# Patient Record
Sex: Male | Born: 1955
Health system: Southern US, Community
[De-identification: ages and names within clinical notes are randomized; demographics above are authoritative.]

## PROBLEM LIST (undated history)

## (undated) DIAGNOSIS — K769 Liver disease, unspecified: Secondary | ICD-10-CM

## (undated) DIAGNOSIS — F909 Attention-deficit hyperactivity disorder, unspecified type: Secondary | ICD-10-CM

## (undated) DIAGNOSIS — Z21 Asymptomatic human immunodeficiency virus [HIV] infection status: Secondary | ICD-10-CM

## (undated) DIAGNOSIS — I10 Essential (primary) hypertension: Secondary | ICD-10-CM

## (undated) DIAGNOSIS — K219 Gastro-esophageal reflux disease without esophagitis: Secondary | ICD-10-CM

## (undated) DIAGNOSIS — IMO0002 Reserved for concepts with insufficient information to code with codable children: Secondary | ICD-10-CM

## (undated) DIAGNOSIS — R42 Dizziness and giddiness: Secondary | ICD-10-CM

## (undated) DIAGNOSIS — N189 Chronic kidney disease, unspecified: Secondary | ICD-10-CM

## (undated) DIAGNOSIS — D689 Coagulation defect, unspecified: Secondary | ICD-10-CM

## (undated) DIAGNOSIS — G4733 Obstructive sleep apnea (adult) (pediatric): Secondary | ICD-10-CM

## (undated) DIAGNOSIS — F329 Major depressive disorder, single episode, unspecified: Secondary | ICD-10-CM

## (undated) DIAGNOSIS — E119 Type 2 diabetes mellitus without complications: Secondary | ICD-10-CM

## (undated) DIAGNOSIS — F419 Anxiety disorder, unspecified: Secondary | ICD-10-CM

## (undated) DIAGNOSIS — I739 Peripheral vascular disease, unspecified: Secondary | ICD-10-CM

## (undated) DIAGNOSIS — F32A Depression, unspecified: Secondary | ICD-10-CM

## (undated) DIAGNOSIS — B20 Human immunodeficiency virus [HIV] disease: Secondary | ICD-10-CM

## (undated) HISTORY — DX: Obstructive sleep apnea (adult) (pediatric): G47.33

## (undated) HISTORY — DX: Type 2 diabetes mellitus without complications: E11.9

## (undated) HISTORY — DX: Major depressive disorder, single episode, unspecified: F32.9

## (undated) HISTORY — DX: Peripheral vascular disease, unspecified: I73.9

## (undated) HISTORY — DX: Anxiety disorder, unspecified: F41.9

## (undated) HISTORY — DX: Human immunodeficiency virus (HIV) disease: B20

## (undated) HISTORY — DX: Reserved for concepts with insufficient information to code with codable children: IMO0002

## (undated) HISTORY — PX: STOMACH SURGERY: SHX791

## (undated) HISTORY — DX: Liver disease, unspecified: K76.9

## (undated) HISTORY — DX: Attention-deficit hyperactivity disorder, unspecified type: F90.9

## (undated) HISTORY — DX: Dizziness and giddiness: R42

## (undated) HISTORY — DX: Depression, unspecified: F32.A

## (undated) HISTORY — DX: Chronic kidney disease, unspecified: N18.9

## (undated) HISTORY — DX: Essential (primary) hypertension: I10

## (undated) HISTORY — DX: Coagulation defect, unspecified: D68.9

## (undated) HISTORY — PX: SMALL INTESTINE SURGERY: SHX150

## (undated) HISTORY — PX: COLONOSCOPY: SHX5424

## (undated) HISTORY — DX: Asymptomatic human immunodeficiency virus (hiv) infection status: Z21

## (undated) HISTORY — DX: Gastro-esophageal reflux disease without esophagitis: K21.9

---

## 1997-08-31 ENCOUNTER — Ambulatory Visit (HOSPITAL_COMMUNITY): Admission: RE | Admit: 1997-08-31 | Discharge: 1997-08-31 | Payer: Self-pay | Admitting: Emergency Medicine

## 1997-09-04 ENCOUNTER — Emergency Department (HOSPITAL_COMMUNITY): Admission: EM | Admit: 1997-09-04 | Discharge: 1997-09-04 | Payer: Self-pay | Admitting: Emergency Medicine

## 1997-09-08 ENCOUNTER — Encounter (INDEPENDENT_AMBULATORY_CARE_PROVIDER_SITE_OTHER): Payer: Self-pay | Admitting: *Deleted

## 1997-09-08 ENCOUNTER — Encounter: Admission: RE | Admit: 1997-09-08 | Discharge: 1997-09-08 | Payer: Self-pay | Admitting: Infectious Diseases

## 1997-09-08 ENCOUNTER — Encounter: Payer: Self-pay | Admitting: Infectious Diseases

## 1997-09-08 LAB — CONVERTED CEMR LAB
CD4 Count: 290 microliters
CD4 T Cell Abs: 290

## 1997-09-29 ENCOUNTER — Encounter: Admission: RE | Admit: 1997-09-29 | Discharge: 1997-09-29 | Payer: Self-pay | Admitting: Infectious Diseases

## 1997-10-13 ENCOUNTER — Encounter: Admission: RE | Admit: 1997-10-13 | Discharge: 1997-10-13 | Payer: Self-pay | Admitting: Infectious Diseases

## 1997-10-20 ENCOUNTER — Encounter: Admission: RE | Admit: 1997-10-20 | Discharge: 1997-10-20 | Payer: Self-pay | Admitting: Infectious Diseases

## 1997-12-08 ENCOUNTER — Ambulatory Visit (HOSPITAL_COMMUNITY): Admission: RE | Admit: 1997-12-08 | Discharge: 1997-12-08 | Payer: Self-pay | Admitting: Infectious Diseases

## 1997-12-08 ENCOUNTER — Encounter: Admission: RE | Admit: 1997-12-08 | Discharge: 1997-12-08 | Payer: Self-pay | Admitting: Infectious Diseases

## 1998-02-02 ENCOUNTER — Encounter: Admission: RE | Admit: 1998-02-02 | Discharge: 1998-02-02 | Payer: Self-pay | Admitting: Infectious Diseases

## 1998-03-22 ENCOUNTER — Encounter: Admission: RE | Admit: 1998-03-22 | Discharge: 1998-03-22 | Payer: Self-pay | Admitting: Infectious Diseases

## 1998-03-22 ENCOUNTER — Ambulatory Visit (HOSPITAL_COMMUNITY): Admission: RE | Admit: 1998-03-22 | Discharge: 1998-03-22 | Payer: Self-pay | Admitting: Infectious Diseases

## 1998-03-28 ENCOUNTER — Encounter: Admission: RE | Admit: 1998-03-28 | Discharge: 1998-03-28 | Payer: Self-pay | Admitting: Infectious Diseases

## 1998-06-13 ENCOUNTER — Ambulatory Visit (HOSPITAL_COMMUNITY): Admission: RE | Admit: 1998-06-13 | Discharge: 1998-06-13 | Payer: Self-pay | Admitting: Infectious Diseases

## 1998-06-28 ENCOUNTER — Encounter: Admission: RE | Admit: 1998-06-28 | Discharge: 1998-06-28 | Payer: Self-pay | Admitting: Infectious Diseases

## 1998-08-08 ENCOUNTER — Ambulatory Visit (HOSPITAL_COMMUNITY): Admission: RE | Admit: 1998-08-08 | Discharge: 1998-08-08 | Payer: Self-pay | Admitting: Gastroenterology

## 1998-08-15 ENCOUNTER — Ambulatory Visit (HOSPITAL_COMMUNITY): Admission: RE | Admit: 1998-08-15 | Discharge: 1998-08-15 | Payer: Self-pay | Admitting: Infectious Diseases

## 1998-09-13 ENCOUNTER — Encounter: Admission: RE | Admit: 1998-09-13 | Discharge: 1998-09-13 | Payer: Self-pay | Admitting: Infectious Diseases

## 1998-09-17 ENCOUNTER — Ambulatory Visit (HOSPITAL_COMMUNITY): Admission: RE | Admit: 1998-09-17 | Discharge: 1998-09-17 | Payer: Self-pay | Admitting: Gastroenterology

## 1998-11-19 ENCOUNTER — Observation Stay (HOSPITAL_COMMUNITY): Admission: RE | Admit: 1998-11-19 | Discharge: 1998-11-21 | Payer: Self-pay | Admitting: General Surgery

## 1998-12-12 ENCOUNTER — Encounter: Admission: RE | Admit: 1998-12-12 | Discharge: 1998-12-12 | Payer: Self-pay | Admitting: Internal Medicine

## 1998-12-12 ENCOUNTER — Ambulatory Visit (HOSPITAL_COMMUNITY): Admission: RE | Admit: 1998-12-12 | Discharge: 1998-12-12 | Payer: Self-pay | Admitting: Infectious Diseases

## 1998-12-28 ENCOUNTER — Encounter: Admission: RE | Admit: 1998-12-28 | Discharge: 1998-12-28 | Payer: Self-pay | Admitting: Infectious Diseases

## 1999-01-20 ENCOUNTER — Emergency Department (HOSPITAL_COMMUNITY): Admission: EM | Admit: 1999-01-20 | Discharge: 1999-01-20 | Payer: Self-pay | Admitting: Emergency Medicine

## 1999-01-30 ENCOUNTER — Inpatient Hospital Stay (HOSPITAL_COMMUNITY): Admission: EM | Admit: 1999-01-30 | Discharge: 1999-02-03 | Payer: Self-pay | Admitting: Emergency Medicine

## 1999-02-06 ENCOUNTER — Encounter: Admission: RE | Admit: 1999-02-06 | Discharge: 1999-02-06 | Payer: Self-pay | Admitting: Internal Medicine

## 1999-02-13 ENCOUNTER — Encounter: Admission: RE | Admit: 1999-02-13 | Discharge: 1999-02-13 | Payer: Self-pay | Admitting: Infectious Diseases

## 1999-02-20 ENCOUNTER — Encounter: Admission: RE | Admit: 1999-02-20 | Discharge: 1999-02-20 | Payer: Self-pay | Admitting: Infectious Diseases

## 1999-02-27 ENCOUNTER — Encounter: Admission: RE | Admit: 1999-02-27 | Discharge: 1999-02-27 | Payer: Self-pay | Admitting: Infectious Diseases

## 1999-03-06 ENCOUNTER — Encounter: Admission: RE | Admit: 1999-03-06 | Discharge: 1999-03-06 | Payer: Self-pay | Admitting: Infectious Diseases

## 1999-03-13 ENCOUNTER — Encounter: Admission: RE | Admit: 1999-03-13 | Discharge: 1999-03-13 | Payer: Self-pay | Admitting: Internal Medicine

## 1999-03-20 ENCOUNTER — Encounter: Admission: RE | Admit: 1999-03-20 | Discharge: 1999-03-20 | Payer: Self-pay | Admitting: Infectious Diseases

## 1999-03-27 ENCOUNTER — Ambulatory Visit (HOSPITAL_COMMUNITY): Admission: RE | Admit: 1999-03-27 | Discharge: 1999-03-27 | Payer: Self-pay | Admitting: Infectious Diseases

## 1999-04-10 ENCOUNTER — Encounter: Admission: RE | Admit: 1999-04-10 | Discharge: 1999-04-10 | Payer: Self-pay | Admitting: Infectious Diseases

## 1999-04-11 ENCOUNTER — Encounter: Admission: RE | Admit: 1999-04-11 | Discharge: 1999-04-11 | Payer: Self-pay | Admitting: Infectious Diseases

## 1999-06-06 ENCOUNTER — Encounter: Admission: RE | Admit: 1999-06-06 | Discharge: 1999-06-06 | Payer: Self-pay | Admitting: Infectious Diseases

## 1999-08-02 ENCOUNTER — Encounter: Payer: Self-pay | Admitting: Infectious Diseases

## 1999-08-02 ENCOUNTER — Encounter: Admission: RE | Admit: 1999-08-02 | Discharge: 1999-08-02 | Payer: Self-pay | Admitting: Infectious Diseases

## 1999-11-14 ENCOUNTER — Encounter: Admission: RE | Admit: 1999-11-14 | Discharge: 1999-11-14 | Payer: Self-pay | Admitting: Infectious Diseases

## 1999-11-15 ENCOUNTER — Ambulatory Visit (HOSPITAL_COMMUNITY): Admission: RE | Admit: 1999-11-15 | Discharge: 1999-11-15 | Payer: Self-pay | Admitting: General Surgery

## 1999-12-11 ENCOUNTER — Encounter: Admission: RE | Admit: 1999-12-11 | Discharge: 1999-12-11 | Payer: Self-pay | Admitting: Hematology and Oncology

## 2000-03-12 ENCOUNTER — Encounter: Admission: RE | Admit: 2000-03-12 | Discharge: 2000-03-12 | Payer: Self-pay | Admitting: Infectious Diseases

## 2000-03-12 ENCOUNTER — Ambulatory Visit (HOSPITAL_COMMUNITY): Admission: RE | Admit: 2000-03-12 | Discharge: 2000-03-12 | Payer: Self-pay | Admitting: Infectious Diseases

## 2000-07-09 ENCOUNTER — Encounter: Admission: RE | Admit: 2000-07-09 | Discharge: 2000-07-09 | Payer: Self-pay | Admitting: Infectious Diseases

## 2000-09-03 ENCOUNTER — Encounter: Admission: RE | Admit: 2000-09-03 | Discharge: 2000-09-03 | Payer: Self-pay | Admitting: Infectious Diseases

## 2000-09-24 ENCOUNTER — Encounter: Admission: RE | Admit: 2000-09-24 | Discharge: 2000-09-24 | Payer: Self-pay | Admitting: Infectious Diseases

## 2000-09-24 ENCOUNTER — Ambulatory Visit (HOSPITAL_COMMUNITY): Admission: RE | Admit: 2000-09-24 | Discharge: 2000-09-24 | Payer: Self-pay | Admitting: Infectious Diseases

## 2000-10-15 ENCOUNTER — Encounter: Admission: RE | Admit: 2000-10-15 | Discharge: 2000-10-15 | Payer: Self-pay | Admitting: Infectious Diseases

## 2000-12-16 ENCOUNTER — Encounter: Admission: RE | Admit: 2000-12-16 | Discharge: 2000-12-16 | Payer: Self-pay | Admitting: Infectious Diseases

## 2001-02-14 ENCOUNTER — Ambulatory Visit (HOSPITAL_BASED_OUTPATIENT_CLINIC_OR_DEPARTMENT_OTHER): Admission: RE | Admit: 2001-02-14 | Discharge: 2001-02-14 | Payer: Self-pay | Admitting: Emergency Medicine

## 2001-03-17 ENCOUNTER — Ambulatory Visit (HOSPITAL_COMMUNITY): Admission: RE | Admit: 2001-03-17 | Discharge: 2001-03-17 | Payer: Self-pay | Admitting: Infectious Diseases

## 2001-03-17 ENCOUNTER — Encounter: Admission: RE | Admit: 2001-03-17 | Discharge: 2001-03-17 | Payer: Self-pay | Admitting: Infectious Diseases

## 2001-03-29 ENCOUNTER — Ambulatory Visit (HOSPITAL_BASED_OUTPATIENT_CLINIC_OR_DEPARTMENT_OTHER): Admission: RE | Admit: 2001-03-29 | Discharge: 2001-03-29 | Payer: Self-pay | Admitting: Emergency Medicine

## 2001-05-13 ENCOUNTER — Encounter: Admission: RE | Admit: 2001-05-13 | Discharge: 2001-05-13 | Payer: Self-pay | Admitting: Infectious Diseases

## 2001-06-29 ENCOUNTER — Ambulatory Visit (HOSPITAL_COMMUNITY): Admission: RE | Admit: 2001-06-29 | Discharge: 2001-06-29 | Payer: Self-pay | Admitting: Infectious Diseases

## 2001-06-29 ENCOUNTER — Encounter: Admission: RE | Admit: 2001-06-29 | Discharge: 2001-06-29 | Payer: Self-pay | Admitting: Infectious Diseases

## 2001-07-08 ENCOUNTER — Encounter: Admission: RE | Admit: 2001-07-08 | Discharge: 2001-07-08 | Payer: Self-pay | Admitting: Infectious Diseases

## 2001-07-22 ENCOUNTER — Encounter: Admission: RE | Admit: 2001-07-22 | Discharge: 2001-07-22 | Payer: Self-pay | Admitting: Orthopedic Surgery

## 2001-08-05 ENCOUNTER — Ambulatory Visit (HOSPITAL_COMMUNITY): Admission: RE | Admit: 2001-08-05 | Discharge: 2001-08-05 | Payer: Self-pay | Admitting: Infectious Diseases

## 2001-08-05 ENCOUNTER — Encounter: Admission: RE | Admit: 2001-08-05 | Discharge: 2001-08-05 | Payer: Self-pay | Admitting: Internal Medicine

## 2001-08-26 ENCOUNTER — Encounter: Admission: RE | Admit: 2001-08-26 | Discharge: 2001-08-26 | Payer: Self-pay | Admitting: Infectious Diseases

## 2001-09-29 ENCOUNTER — Encounter: Admission: RE | Admit: 2001-09-29 | Discharge: 2001-09-29 | Payer: Self-pay | Admitting: Infectious Diseases

## 2001-09-29 ENCOUNTER — Ambulatory Visit (HOSPITAL_COMMUNITY): Admission: RE | Admit: 2001-09-29 | Discharge: 2001-09-29 | Payer: Self-pay | Admitting: Infectious Diseases

## 2001-10-14 ENCOUNTER — Encounter: Admission: RE | Admit: 2001-10-14 | Discharge: 2001-10-14 | Payer: Self-pay | Admitting: Infectious Diseases

## 2001-12-15 ENCOUNTER — Ambulatory Visit (HOSPITAL_COMMUNITY): Admission: RE | Admit: 2001-12-15 | Discharge: 2001-12-15 | Payer: Self-pay | Admitting: Infectious Diseases

## 2001-12-15 ENCOUNTER — Encounter: Admission: RE | Admit: 2001-12-15 | Discharge: 2001-12-15 | Payer: Self-pay | Admitting: Infectious Diseases

## 2001-12-30 ENCOUNTER — Encounter: Admission: RE | Admit: 2001-12-30 | Discharge: 2001-12-30 | Payer: Self-pay | Admitting: Infectious Diseases

## 2002-03-09 ENCOUNTER — Ambulatory Visit (HOSPITAL_COMMUNITY): Admission: RE | Admit: 2002-03-09 | Discharge: 2002-03-09 | Payer: Self-pay | Admitting: Infectious Diseases

## 2002-03-09 ENCOUNTER — Encounter: Admission: RE | Admit: 2002-03-09 | Discharge: 2002-03-09 | Payer: Self-pay | Admitting: Infectious Diseases

## 2002-03-24 ENCOUNTER — Encounter: Admission: RE | Admit: 2002-03-24 | Discharge: 2002-03-24 | Payer: Self-pay | Admitting: Infectious Diseases

## 2002-07-14 ENCOUNTER — Encounter: Payer: Self-pay | Admitting: Infectious Diseases

## 2002-07-14 ENCOUNTER — Encounter: Admission: RE | Admit: 2002-07-14 | Discharge: 2002-07-14 | Payer: Self-pay | Admitting: Infectious Diseases

## 2002-07-28 ENCOUNTER — Encounter: Admission: RE | Admit: 2002-07-28 | Discharge: 2002-07-28 | Payer: Self-pay | Admitting: Infectious Diseases

## 2002-11-01 ENCOUNTER — Ambulatory Visit (HOSPITAL_COMMUNITY): Admission: RE | Admit: 2002-11-01 | Discharge: 2002-11-01 | Payer: Self-pay | Admitting: Infectious Diseases

## 2002-11-01 ENCOUNTER — Encounter: Payer: Self-pay | Admitting: Infectious Diseases

## 2002-11-05 ENCOUNTER — Emergency Department (HOSPITAL_COMMUNITY): Admission: EM | Admit: 2002-11-05 | Discharge: 2002-11-05 | Payer: Self-pay | Admitting: Emergency Medicine

## 2002-11-05 ENCOUNTER — Encounter: Payer: Self-pay | Admitting: Emergency Medicine

## 2002-11-11 ENCOUNTER — Encounter: Admission: RE | Admit: 2002-11-11 | Discharge: 2002-11-11 | Payer: Self-pay | Admitting: Infectious Diseases

## 2003-01-26 ENCOUNTER — Ambulatory Visit (HOSPITAL_COMMUNITY): Admission: RE | Admit: 2003-01-26 | Discharge: 2003-01-26 | Payer: Self-pay | Admitting: Infectious Diseases

## 2003-01-26 ENCOUNTER — Encounter: Payer: Self-pay | Admitting: Infectious Diseases

## 2003-01-26 ENCOUNTER — Encounter: Admission: RE | Admit: 2003-01-26 | Discharge: 2003-01-26 | Payer: Self-pay | Admitting: Infectious Diseases

## 2003-02-09 ENCOUNTER — Encounter: Admission: RE | Admit: 2003-02-09 | Discharge: 2003-02-09 | Payer: Self-pay | Admitting: Infectious Diseases

## 2003-02-10 ENCOUNTER — Encounter: Admission: RE | Admit: 2003-02-10 | Discharge: 2003-02-10 | Payer: Self-pay | Admitting: Emergency Medicine

## 2003-04-27 ENCOUNTER — Ambulatory Visit (HOSPITAL_BASED_OUTPATIENT_CLINIC_OR_DEPARTMENT_OTHER): Admission: RE | Admit: 2003-04-27 | Discharge: 2003-04-27 | Payer: Self-pay | Admitting: Emergency Medicine

## 2003-04-29 ENCOUNTER — Emergency Department (HOSPITAL_COMMUNITY): Admission: AD | Admit: 2003-04-29 | Discharge: 2003-04-30 | Payer: Self-pay | Admitting: Emergency Medicine

## 2003-05-05 ENCOUNTER — Encounter: Admission: RE | Admit: 2003-05-05 | Discharge: 2003-05-05 | Payer: Self-pay | Admitting: Infectious Diseases

## 2003-05-05 ENCOUNTER — Ambulatory Visit (HOSPITAL_COMMUNITY): Admission: RE | Admit: 2003-05-05 | Discharge: 2003-05-05 | Payer: Self-pay | Admitting: Infectious Diseases

## 2003-05-18 ENCOUNTER — Encounter: Admission: RE | Admit: 2003-05-18 | Discharge: 2003-05-18 | Payer: Self-pay | Admitting: Infectious Diseases

## 2003-08-16 ENCOUNTER — Ambulatory Visit (HOSPITAL_COMMUNITY): Admission: RE | Admit: 2003-08-16 | Discharge: 2003-08-16 | Payer: Self-pay | Admitting: Infectious Diseases

## 2003-08-16 ENCOUNTER — Encounter: Admission: RE | Admit: 2003-08-16 | Discharge: 2003-08-16 | Payer: Self-pay | Admitting: Infectious Diseases

## 2003-10-05 ENCOUNTER — Encounter: Admission: RE | Admit: 2003-10-05 | Discharge: 2003-10-05 | Payer: Self-pay | Admitting: Infectious Diseases

## 2003-12-28 ENCOUNTER — Ambulatory Visit (HOSPITAL_COMMUNITY): Admission: RE | Admit: 2003-12-28 | Discharge: 2003-12-28 | Payer: Self-pay | Admitting: Infectious Diseases

## 2003-12-28 ENCOUNTER — Ambulatory Visit: Payer: Self-pay | Admitting: Infectious Diseases

## 2004-01-26 ENCOUNTER — Ambulatory Visit: Payer: Self-pay | Admitting: Infectious Diseases

## 2004-05-09 ENCOUNTER — Ambulatory Visit: Payer: Self-pay | Admitting: Infectious Diseases

## 2004-05-09 ENCOUNTER — Ambulatory Visit (HOSPITAL_COMMUNITY): Admission: RE | Admit: 2004-05-09 | Discharge: 2004-05-09 | Payer: Self-pay | Admitting: Infectious Diseases

## 2004-05-23 ENCOUNTER — Ambulatory Visit: Payer: Self-pay | Admitting: Infectious Diseases

## 2004-08-21 ENCOUNTER — Ambulatory Visit (HOSPITAL_COMMUNITY): Admission: RE | Admit: 2004-08-21 | Discharge: 2004-08-21 | Payer: Self-pay | Admitting: Infectious Diseases

## 2004-08-21 ENCOUNTER — Ambulatory Visit: Payer: Self-pay | Admitting: Infectious Diseases

## 2004-09-12 ENCOUNTER — Ambulatory Visit: Payer: Self-pay | Admitting: Infectious Diseases

## 2005-01-02 ENCOUNTER — Ambulatory Visit: Payer: Self-pay | Admitting: Infectious Diseases

## 2005-01-02 ENCOUNTER — Encounter (INDEPENDENT_AMBULATORY_CARE_PROVIDER_SITE_OTHER): Payer: Self-pay | Admitting: *Deleted

## 2005-01-02 ENCOUNTER — Ambulatory Visit (HOSPITAL_COMMUNITY): Admission: RE | Admit: 2005-01-02 | Discharge: 2005-01-02 | Payer: Self-pay | Admitting: Infectious Diseases

## 2005-01-02 LAB — CONVERTED CEMR LAB
CD4 Count: 260 microliters
HIV 1 RNA Quant: 399 copies/mL

## 2005-01-15 ENCOUNTER — Ambulatory Visit: Payer: Self-pay | Admitting: Infectious Diseases

## 2005-06-19 ENCOUNTER — Encounter (INDEPENDENT_AMBULATORY_CARE_PROVIDER_SITE_OTHER): Payer: Self-pay | Admitting: *Deleted

## 2005-06-19 ENCOUNTER — Ambulatory Visit: Payer: Self-pay | Admitting: Infectious Diseases

## 2005-06-19 ENCOUNTER — Encounter: Admission: RE | Admit: 2005-06-19 | Discharge: 2005-06-19 | Payer: Self-pay | Admitting: Infectious Diseases

## 2005-06-19 LAB — CONVERTED CEMR LAB
CD4 Count: 410 microliters
HIV 1 RNA Quant: 399 copies/mL

## 2005-07-04 ENCOUNTER — Ambulatory Visit: Payer: Self-pay | Admitting: Infectious Diseases

## 2005-12-11 ENCOUNTER — Encounter: Admission: RE | Admit: 2005-12-11 | Discharge: 2005-12-11 | Payer: Self-pay | Admitting: Infectious Diseases

## 2005-12-11 ENCOUNTER — Ambulatory Visit: Payer: Self-pay | Admitting: Infectious Diseases

## 2005-12-11 ENCOUNTER — Encounter (INDEPENDENT_AMBULATORY_CARE_PROVIDER_SITE_OTHER): Payer: Self-pay | Admitting: *Deleted

## 2005-12-11 LAB — CONVERTED CEMR LAB
CD4 Count: 330 microliters
HIV 1 RNA Quant: 173 copies/mL

## 2005-12-25 ENCOUNTER — Ambulatory Visit: Payer: Self-pay | Admitting: Infectious Diseases

## 2006-04-20 ENCOUNTER — Encounter (INDEPENDENT_AMBULATORY_CARE_PROVIDER_SITE_OTHER): Payer: Self-pay | Admitting: *Deleted

## 2006-04-20 LAB — CONVERTED CEMR LAB

## 2006-05-03 ENCOUNTER — Encounter (INDEPENDENT_AMBULATORY_CARE_PROVIDER_SITE_OTHER): Payer: Self-pay | Admitting: *Deleted

## 2006-06-04 DIAGNOSIS — R7989 Other specified abnormal findings of blood chemistry: Secondary | ICD-10-CM | POA: Insufficient documentation

## 2006-06-04 DIAGNOSIS — F909 Attention-deficit hyperactivity disorder, unspecified type: Secondary | ICD-10-CM | POA: Insufficient documentation

## 2006-06-04 DIAGNOSIS — B009 Herpesviral infection, unspecified: Secondary | ICD-10-CM | POA: Insufficient documentation

## 2006-06-04 DIAGNOSIS — F329 Major depressive disorder, single episode, unspecified: Secondary | ICD-10-CM | POA: Insufficient documentation

## 2006-06-04 DIAGNOSIS — F32A Depression, unspecified: Secondary | ICD-10-CM | POA: Insufficient documentation

## 2006-06-04 DIAGNOSIS — R799 Abnormal finding of blood chemistry, unspecified: Secondary | ICD-10-CM | POA: Insufficient documentation

## 2006-06-04 DIAGNOSIS — F988 Other specified behavioral and emotional disorders with onset usually occurring in childhood and adolescence: Secondary | ICD-10-CM | POA: Insufficient documentation

## 2006-06-04 DIAGNOSIS — B20 Human immunodeficiency virus [HIV] disease: Secondary | ICD-10-CM | POA: Insufficient documentation

## 2006-06-04 DIAGNOSIS — M19049 Primary osteoarthritis, unspecified hand: Secondary | ICD-10-CM | POA: Insufficient documentation

## 2006-06-04 DIAGNOSIS — K219 Gastro-esophageal reflux disease without esophagitis: Secondary | ICD-10-CM | POA: Insufficient documentation

## 2006-06-04 DIAGNOSIS — B029 Zoster without complications: Secondary | ICD-10-CM | POA: Insufficient documentation

## 2006-06-04 DIAGNOSIS — Z21 Asymptomatic human immunodeficiency virus [HIV] infection status: Secondary | ICD-10-CM | POA: Insufficient documentation

## 2006-06-04 DIAGNOSIS — I809 Phlebitis and thrombophlebitis of unspecified site: Secondary | ICD-10-CM | POA: Insufficient documentation

## 2006-06-04 DIAGNOSIS — Z8619 Personal history of other infectious and parasitic diseases: Secondary | ICD-10-CM | POA: Insufficient documentation

## 2006-06-05 ENCOUNTER — Encounter: Payer: Self-pay | Admitting: Infectious Diseases

## 2006-08-20 ENCOUNTER — Encounter: Admission: RE | Admit: 2006-08-20 | Discharge: 2006-08-20 | Payer: Self-pay | Admitting: Infectious Diseases

## 2006-08-20 ENCOUNTER — Ambulatory Visit: Payer: Self-pay | Admitting: Infectious Diseases

## 2006-08-20 LAB — CONVERTED CEMR LAB
Blood Glucose, Fingerstick: 111
HIV 1 RNA Quant: 164 copies/mL — ABNORMAL HIGH (ref <50)
HIV-1 RNA Quant, Log: 2.21 — ABNORMAL HIGH (ref <1.70)
Hgb A1c MFr Bld: 5.3 %

## 2006-08-21 ENCOUNTER — Encounter: Payer: Self-pay | Admitting: Infectious Diseases

## 2006-08-21 LAB — CONVERTED CEMR LAB
ALT: 27 units/L (ref 0–53)
AST: 24 units/L (ref 0–37)
Albumin: 4.2 g/dL (ref 3.5–5.2)
Alkaline Phosphatase: 123 units/L — ABNORMAL HIGH (ref 39–117)
BUN: 21 mg/dL (ref 6–23)
Basophils Absolute: 0 10*3/uL (ref 0.0–0.1)
Basophils Relative: 0 % (ref 0–1)
CD4 Count: 470 microliters
CO2: 22 meq/L (ref 19–32)
Calcium: 9.1 mg/dL (ref 8.4–10.5)
Chloride: 107 meq/L (ref 96–112)
Cholesterol: 201 mg/dL — ABNORMAL HIGH (ref 0–200)
Creatinine, Ser: 1.12 mg/dL (ref 0.40–1.50)
Eosinophils Absolute: 0.2 10*3/uL (ref 0.0–0.7)
Eosinophils Relative: 4 % (ref 0–5)
Glucose, Bld: 108 mg/dL — ABNORMAL HIGH (ref 70–99)
HCT: 43.6 % (ref 39.0–52.0)
HDL: 45 mg/dL (ref >39)
Hemoglobin: 14.8 g/dL (ref 13.0–17.0)
Ketones, ur: NEGATIVE mg/dL
LDL Cholesterol: 125 mg/dL — ABNORMAL HIGH (ref 0–99)
Lymphocytes Relative: 31 % (ref 12–46)
Lymphs Abs: 1.4 10*3/uL (ref 0.7–3.3)
MCHC: 33.9 g/dL (ref 30.0–36.0)
MCV: 90.6 fL (ref 78.0–100.0)
Monocytes Absolute: 0.5 10*3/uL (ref 0.2–0.7)
Monocytes Relative: 10 % (ref 3–11)
Neutro Abs: 2.5 10*3/uL (ref 1.7–7.7)
Neutrophils Relative %: 55 % (ref 43–77)
Nitrite: NEGATIVE
Platelets: 160 10*3/uL (ref 150–400)
Potassium: 4 meq/L (ref 3.5–5.3)
Protein, ur: 30 mg/dL — AB
RBC: 4.81 M/uL (ref 4.22–5.81)
RDW: 14.8 % — ABNORMAL HIGH (ref 11.5–14.0)
Sodium: 138 meq/L (ref 135–145)
Specific Gravity, Urine: 1.025 (ref 1.005–1.03)
Total Bilirubin: 0.5 mg/dL (ref 0.3–1.2)
Total CHOL/HDL Ratio: 4.5
Total Protein: 6.9 g/dL (ref 6.0–8.3)
Triglycerides: 153 mg/dL — ABNORMAL HIGH (ref <150)
Urine Glucose: NEGATIVE mg/dL
Urobilinogen, UA: 0.2 (ref 0.0–1.0)
VLDL: 31 mg/dL (ref 0–40)
WBC: 4.5 10*3/uL (ref 4.0–10.5)
pH: 5.5 (ref 5.0–8.0)

## 2006-09-03 ENCOUNTER — Ambulatory Visit: Payer: Self-pay | Admitting: Infectious Diseases

## 2006-09-03 DIAGNOSIS — R82998 Other abnormal findings in urine: Secondary | ICD-10-CM | POA: Insufficient documentation

## 2006-09-03 DIAGNOSIS — R8290 Unspecified abnormal findings in urine: Secondary | ICD-10-CM | POA: Insufficient documentation

## 2006-09-15 DIAGNOSIS — N390 Urinary tract infection, site not specified: Secondary | ICD-10-CM | POA: Insufficient documentation

## 2006-10-05 ENCOUNTER — Encounter: Payer: Self-pay | Admitting: Infectious Diseases

## 2007-01-01 ENCOUNTER — Encounter: Admission: RE | Admit: 2007-01-01 | Discharge: 2007-01-01 | Payer: Self-pay | Admitting: Infectious Diseases

## 2007-01-01 ENCOUNTER — Ambulatory Visit: Payer: Self-pay | Admitting: Infectious Diseases

## 2007-01-01 LAB — CONVERTED CEMR LAB
ALT: 29 units/L (ref 0–53)
AST: 23 units/L (ref 0–37)
Albumin: 4.5 g/dL (ref 3.5–5.2)
Alkaline Phosphatase: 146 units/L — ABNORMAL HIGH (ref 39–117)
BUN: 16 mg/dL (ref 6–23)
Basophils Absolute: 0 10*3/uL (ref 0.0–0.1)
Basophils Relative: 0 % (ref 0–1)
CO2: 28 meq/L (ref 19–32)
Calcium: 9.3 mg/dL (ref 8.4–10.5)
Chloride: 102 meq/L (ref 96–112)
Creatinine, Ser: 1.29 mg/dL (ref 0.40–1.50)
Eosinophils Absolute: 0.2 10*3/uL (ref 0.0–0.7)
Eosinophils Relative: 4 % (ref 0–5)
Free T4: 1.22 ng/dL (ref 0.89–1.80)
Glucose, Bld: 108 mg/dL — ABNORMAL HIGH (ref 70–99)
Glucose, Urine, Semiquant: NEGATIVE
HCT: 45 % (ref 39.0–52.0)
HIV 1 RNA Quant: 107 copies/mL — ABNORMAL HIGH (ref <50)
HIV-1 RNA Quant, Log: 2.03 — ABNORMAL HIGH (ref <1.70)
Hemoglobin: 15.7 g/dL (ref 13.0–17.0)
Ketones, urine, test strip: NEGATIVE
Lymphocytes Relative: 25 % (ref 12–46)
Lymphs Abs: 1.5 10*3/uL (ref 0.7–3.3)
MCHC: 34.9 g/dL (ref 30.0–36.0)
MCV: 92.7 fL (ref 78.0–100.0)
Monocytes Absolute: 0.5 10*3/uL (ref 0.2–0.7)
Monocytes Relative: 8 % (ref 3–11)
Neutro Abs: 3.7 10*3/uL (ref 1.7–7.7)
Neutrophils Relative %: 63 % (ref 43–77)
Nitrite: NEGATIVE
Platelets: 178 10*3/uL (ref 150–400)
Potassium: 4.1 meq/L (ref 3.5–5.3)
Protein, U semiquant: 30
RBC Folate: 640 ng/mL — ABNORMAL HIGH (ref 180–600)
RBC: 4.85 M/uL (ref 4.22–5.81)
RDW: 13.3 % (ref 11.5–14.0)
Sodium: 140 meq/L (ref 135–145)
Specific Gravity, Urine: >=1.03
TSH: 2.762 microintl units/mL (ref 0.350–5.50)
Total Bilirubin: 0.6 mg/dL (ref 0.3–1.2)
Total Protein: 7.2 g/dL (ref 6.0–8.3)
Urobilinogen, UA: 0.2
Vitamin B-12: 550 pg/mL (ref 211–911)
WBC Urine, dipstick: NEGATIVE
WBC: 5.9 10*3/uL (ref 4.0–10.5)
pH: 5.5

## 2007-01-02 ENCOUNTER — Encounter: Payer: Self-pay | Admitting: Infectious Diseases

## 2007-01-13 ENCOUNTER — Encounter (INDEPENDENT_AMBULATORY_CARE_PROVIDER_SITE_OTHER): Payer: Self-pay | Admitting: *Deleted

## 2007-01-14 ENCOUNTER — Ambulatory Visit: Payer: Self-pay | Admitting: Infectious Diseases

## 2007-02-08 ENCOUNTER — Telehealth: Payer: Self-pay | Admitting: Infectious Diseases

## 2007-02-09 ENCOUNTER — Telehealth: Payer: Self-pay | Admitting: Infectious Diseases

## 2007-02-23 ENCOUNTER — Encounter: Payer: Self-pay | Admitting: Infectious Diseases

## 2007-04-29 ENCOUNTER — Encounter (INDEPENDENT_AMBULATORY_CARE_PROVIDER_SITE_OTHER): Payer: Self-pay | Admitting: *Deleted

## 2007-04-29 ENCOUNTER — Telehealth: Payer: Self-pay | Admitting: Infectious Diseases

## 2007-05-13 ENCOUNTER — Encounter (INDEPENDENT_AMBULATORY_CARE_PROVIDER_SITE_OTHER): Payer: Self-pay | Admitting: *Deleted

## 2007-06-17 ENCOUNTER — Encounter: Payer: Self-pay | Admitting: Infectious Diseases

## 2007-08-17 ENCOUNTER — Encounter: Payer: Self-pay | Admitting: Infectious Diseases

## 2007-08-17 ENCOUNTER — Encounter: Admission: RE | Admit: 2007-08-17 | Discharge: 2007-08-17 | Payer: Self-pay | Admitting: Internal Medicine

## 2007-08-17 ENCOUNTER — Ambulatory Visit: Payer: Self-pay | Admitting: Internal Medicine

## 2007-08-17 LAB — CONVERTED CEMR LAB
ALT: 60 units/L — ABNORMAL HIGH (ref 0–53)
AST: 62 units/L — ABNORMAL HIGH (ref 0–37)
Albumin: 3.9 g/dL (ref 3.5–5.2)
Alkaline Phosphatase: 133 units/L — ABNORMAL HIGH (ref 39–117)
BUN: 23 mg/dL (ref 6–23)
Basophils Absolute: 0 10*3/uL (ref 0.0–0.1)
Basophils Relative: 0 % (ref 0–1)
CO2: 21 meq/L (ref 19–32)
Calcium: 8.9 mg/dL (ref 8.4–10.5)
Chloride: 105 meq/L (ref 96–112)
Creatinine, Ser: 1.07 mg/dL (ref 0.40–1.50)
Eosinophils Absolute: 0.1 10*3/uL (ref 0.0–0.7)
Eosinophils Relative: 3 % (ref 0–5)
Glucose, Bld: 180 mg/dL — ABNORMAL HIGH (ref 70–99)
HCT: 44.5 % (ref 39.0–52.0)
HIV 1 RNA Quant: 189 copies/mL — ABNORMAL HIGH (ref <50)
HIV-1 RNA Quant, Log: 2.28 — ABNORMAL HIGH (ref <1.70)
Hemoglobin: 15.2 g/dL (ref 13.0–17.0)
Lymphocytes Relative: 26 % (ref 12–46)
Lymphs Abs: 1.3 10*3/uL (ref 0.7–4.0)
MCHC: 34.2 g/dL (ref 30.0–36.0)
MCV: 94.1 fL (ref 78.0–100.0)
Monocytes Absolute: 0.5 10*3/uL (ref 0.1–1.0)
Monocytes Relative: 10 % (ref 3–12)
Neutro Abs: 3 10*3/uL (ref 1.7–7.7)
Neutrophils Relative %: 61 % (ref 43–77)
Platelets: 122 10*3/uL — ABNORMAL LOW (ref 150–400)
Potassium: 4.1 meq/L (ref 3.5–5.3)
RBC: 4.73 M/uL (ref 4.22–5.81)
RDW: 14.5 % (ref 11.5–15.5)
Sodium: 141 meq/L (ref 135–145)
Total Bilirubin: 0.6 mg/dL (ref 0.3–1.2)
Total Protein: 6.6 g/dL (ref 6.0–8.3)
WBC: 5 10*3/uL (ref 4.0–10.5)

## 2007-08-26 ENCOUNTER — Ambulatory Visit: Payer: Self-pay | Admitting: Infectious Diseases

## 2007-12-22 ENCOUNTER — Ambulatory Visit: Payer: Self-pay | Admitting: Internal Medicine

## 2008-02-03 ENCOUNTER — Telehealth (INDEPENDENT_AMBULATORY_CARE_PROVIDER_SITE_OTHER): Payer: Self-pay | Admitting: *Deleted

## 2008-02-14 ENCOUNTER — Ambulatory Visit: Payer: Self-pay | Admitting: Infectious Diseases

## 2008-02-14 LAB — CONVERTED CEMR LAB
BUN: 22 mg/dL (ref 6–23)
Basophils Absolute: 0 10*3/uL (ref 0.0–0.1)
Basophils Relative: 0 % (ref 0–1)
CO2: 25 meq/L (ref 19–32)
Calcium: 9.6 mg/dL (ref 8.4–10.5)
Chloride: 102 meq/L (ref 96–112)
Cholesterol: 219 mg/dL — ABNORMAL HIGH (ref 0–200)
Creatinine, Ser: 0.95 mg/dL (ref 0.40–1.50)
Eosinophils Absolute: 0.2 10*3/uL (ref 0.0–0.7)
Eosinophils Relative: 3 % (ref 0–5)
Glucose, Bld: 145 mg/dL — ABNORMAL HIGH (ref 70–99)
HCT: 48.1 % (ref 39.0–52.0)
HDL: 45 mg/dL (ref >39)
HIV 1 RNA Quant: 272 copies/mL — ABNORMAL HIGH (ref <48)
HIV-1 RNA Quant, Log: 2.43 — ABNORMAL HIGH (ref <1.68)
Hemoglobin: 16.9 g/dL (ref 13.0–17.0)
LDL Cholesterol: 108 mg/dL — ABNORMAL HIGH (ref 0–99)
Lymphocytes Relative: 24 % (ref 12–46)
Lymphs Abs: 1.6 10*3/uL (ref 0.7–4.0)
MCHC: 35.1 g/dL (ref 30.0–36.0)
MCV: 91.1 fL (ref 78.0–100.0)
Monocytes Absolute: 0.7 10*3/uL (ref 0.1–1.0)
Monocytes Relative: 10 % (ref 3–12)
Neutro Abs: 4.3 10*3/uL (ref 1.7–7.7)
Neutrophils Relative %: 63 % (ref 43–77)
Platelets: 167 10*3/uL (ref 150–400)
Potassium: 4.3 meq/L (ref 3.5–5.3)
RBC: 5.28 M/uL (ref 4.22–5.81)
RDW: 13.4 % (ref 11.5–15.5)
Sodium: 138 meq/L (ref 135–145)
Total CHOL/HDL Ratio: 4.9
Triglycerides: 332 mg/dL — ABNORMAL HIGH (ref <150)
VLDL: 66 mg/dL — ABNORMAL HIGH (ref 0–40)
WBC: 6.8 10*3/uL (ref 4.0–10.5)

## 2008-03-09 ENCOUNTER — Ambulatory Visit: Payer: Self-pay | Admitting: Infectious Diseases

## 2008-05-08 ENCOUNTER — Encounter (INDEPENDENT_AMBULATORY_CARE_PROVIDER_SITE_OTHER): Payer: Self-pay | Admitting: *Deleted

## 2008-06-21 ENCOUNTER — Encounter (INDEPENDENT_AMBULATORY_CARE_PROVIDER_SITE_OTHER): Payer: Self-pay | Admitting: *Deleted

## 2008-10-13 ENCOUNTER — Ambulatory Visit: Payer: Self-pay | Admitting: Infectious Diseases

## 2008-10-13 LAB — CONVERTED CEMR LAB
ALT: 73 units/L — ABNORMAL HIGH (ref 0–53)
AST: 55 units/L — ABNORMAL HIGH (ref 0–37)
Albumin: 4.2 g/dL (ref 3.5–5.2)
Alkaline Phosphatase: 135 units/L — ABNORMAL HIGH (ref 39–117)
BUN: 19 mg/dL (ref 6–23)
Basophils Absolute: 0 10*3/uL (ref 0.0–0.1)
Basophils Relative: 0 % (ref 0–1)
CO2: 22 meq/L (ref 19–32)
Calcium: 8.8 mg/dL (ref 8.4–10.5)
Chloride: 109 meq/L (ref 96–112)
Creatinine, Ser: 1.06 mg/dL (ref 0.40–1.50)
Eosinophils Absolute: 0.2 10*3/uL (ref 0.0–0.7)
Eosinophils Relative: 3 % (ref 0–5)
Glucose, Bld: 127 mg/dL — ABNORMAL HIGH (ref 70–99)
HCT: 45.2 % (ref 39.0–52.0)
HIV 1 RNA Quant: 113 copies/mL — ABNORMAL HIGH (ref <48)
HIV-1 RNA Quant, Log: 2.05 — ABNORMAL HIGH (ref <1.68)
Hemoglobin: 15.5 g/dL (ref 13.0–17.0)
Lymphocytes Relative: 22 % (ref 12–46)
Lymphs Abs: 1.4 10*3/uL (ref 0.7–4.0)
MCHC: 34.3 g/dL (ref 30.0–36.0)
MCV: 96.2 fL (ref >=78.0)
Monocytes Absolute: 0.6 10*3/uL (ref 0.1–1.0)
Monocytes Relative: 9 % (ref 3–12)
Neutro Abs: 4.1 10*3/uL (ref 1.7–7.7)
Neutrophils Relative %: 66 % (ref 43–77)
Platelets: 123 10*3/uL — ABNORMAL LOW (ref 150–400)
Potassium: 4.1 meq/L (ref 3.5–5.3)
RBC: 4.7 M/uL (ref 4.22–5.81)
RDW: 14.3 % (ref 11.5–15.5)
Sodium: 142 meq/L (ref 135–145)
Total Bilirubin: 0.7 mg/dL (ref 0.3–1.2)
Total Protein: 7 g/dL (ref 6.0–8.3)
WBC: 6.2 10*3/uL (ref 4.0–10.5)

## 2008-10-18 ENCOUNTER — Encounter: Payer: Self-pay | Admitting: Infectious Diseases

## 2008-10-20 ENCOUNTER — Ambulatory Visit: Payer: Self-pay | Admitting: Infectious Diseases

## 2008-11-09 ENCOUNTER — Ambulatory Visit: Payer: Self-pay | Admitting: Infectious Diseases

## 2008-11-24 ENCOUNTER — Ambulatory Visit: Payer: Self-pay | Admitting: Internal Medicine

## 2009-03-29 ENCOUNTER — Ambulatory Visit: Payer: Self-pay | Admitting: Infectious Diseases

## 2009-03-29 LAB — CONVERTED CEMR LAB
ALT: 53 units/L (ref 0–53)
AST: 34 units/L (ref 0–37)
Albumin: 4.6 g/dL (ref 3.5–5.2)
Alkaline Phosphatase: 171 units/L — ABNORMAL HIGH (ref 39–117)
BUN: 15 mg/dL (ref 6–23)
Basophils Absolute: 0 10*3/uL (ref 0.0–0.1)
Basophils Relative: 1 % (ref 0–1)
CO2: 27 meq/L (ref 19–32)
Calcium: 9.7 mg/dL (ref 8.4–10.5)
Chloride: 104 meq/L (ref 96–112)
Cholesterol: 184 mg/dL (ref 0–200)
Creatinine, Ser: 1.1 mg/dL (ref 0.40–1.50)
Eosinophils Absolute: 0.2 10*3/uL (ref 0.0–0.7)
Eosinophils Relative: 2 % (ref 0–5)
Glucose, Bld: 102 mg/dL — ABNORMAL HIGH (ref 70–99)
HCT: 48.9 % (ref 39.0–52.0)
HDL: 52 mg/dL (ref >39)
HIV 1 RNA Quant: 179 copies/mL — ABNORMAL HIGH (ref <48)
HIV-1 RNA Quant, Log: 2.25 — ABNORMAL HIGH (ref <1.68)
Hemoglobin: 17.4 g/dL — ABNORMAL HIGH (ref 13.0–17.0)
LDL Cholesterol: 59 mg/dL (ref 0–99)
Lymphocytes Relative: 28 % (ref 12–46)
Lymphs Abs: 2.4 10*3/uL (ref 0.7–4.0)
MCHC: 35.6 g/dL (ref 30.0–36.0)
MCV: 92.4 fL (ref >=78.0)
Monocytes Absolute: 0.8 10*3/uL (ref 0.1–1.0)
Monocytes Relative: 9 % (ref 3–12)
Neutro Abs: 5.1 10*3/uL (ref 1.7–7.7)
Neutrophils Relative %: 60 % (ref 43–77)
Platelets: 165 10*3/uL (ref 150–400)
Potassium: 4.4 meq/L (ref 3.5–5.3)
RBC: 5.29 M/uL (ref 4.22–5.81)
RDW: 13.5 % (ref 11.5–15.5)
Sodium: 141 meq/L (ref 135–145)
Total Bilirubin: 0.5 mg/dL (ref 0.3–1.2)
Total CHOL/HDL Ratio: 3.5
Total Protein: 7.9 g/dL (ref 6.0–8.3)
Triglycerides: 363 mg/dL — ABNORMAL HIGH (ref <150)
VLDL: 73 mg/dL — ABNORMAL HIGH (ref 0–40)
WBC: 8.5 10*3/uL (ref 4.0–10.5)

## 2009-04-12 ENCOUNTER — Ambulatory Visit: Payer: Self-pay | Admitting: Infectious Diseases

## 2009-05-01 ENCOUNTER — Encounter (INDEPENDENT_AMBULATORY_CARE_PROVIDER_SITE_OTHER): Payer: Self-pay | Admitting: *Deleted

## 2009-08-13 ENCOUNTER — Ambulatory Visit (HOSPITAL_COMMUNITY): Admission: RE | Admit: 2009-08-13 | Discharge: 2009-08-13 | Payer: Self-pay | Admitting: Emergency Medicine

## 2009-09-13 ENCOUNTER — Encounter: Payer: Self-pay | Admitting: Infectious Diseases

## 2010-01-08 ENCOUNTER — Ambulatory Visit: Payer: Self-pay | Admitting: Infectious Diseases

## 2010-01-08 LAB — CONVERTED CEMR LAB
ALT: 47 units/L (ref 0–53)
AST: 35 units/L (ref 0–37)
Albumin: 4.4 g/dL (ref 3.5–5.2)
Alkaline Phosphatase: 158 units/L — ABNORMAL HIGH (ref 39–117)
BUN: 20 mg/dL (ref 6–23)
Basophils Absolute: 0 10*3/uL (ref 0.0–0.1)
Basophils Relative: 0 % (ref 0–1)
CO2: 29 meq/L (ref 19–32)
Calcium: 9.8 mg/dL (ref 8.4–10.5)
Chloride: 102 meq/L (ref 96–112)
Creatinine, Ser: 1.15 mg/dL (ref 0.40–1.50)
Eosinophils Absolute: 0.3 10*3/uL (ref 0.0–0.7)
Eosinophils Relative: 3 % (ref 0–5)
Glucose, Bld: 143 mg/dL — ABNORMAL HIGH (ref 70–99)
HCT: 48.1 % (ref 39.0–52.0)
HIV 1 RNA Quant: <20 copies/mL (ref <20)
HIV-1 RNA Quant, Log: <1.3 (ref <1.30)
Hemoglobin: 16.8 g/dL (ref 13.0–17.0)
Lymphocytes Relative: 28 % (ref 12–46)
Lymphs Abs: 2.2 10*3/uL (ref 0.7–4.0)
MCHC: 34.9 g/dL (ref 30.0–36.0)
MCV: 92.3 fL (ref 78.0–100.0)
Monocytes Absolute: 0.8 10*3/uL (ref 0.1–1.0)
Monocytes Relative: 10 % (ref 3–12)
Neutro Abs: 4.5 10*3/uL (ref 1.7–7.7)
Neutrophils Relative %: 58 % (ref 43–77)
Platelets: 153 10*3/uL (ref 150–400)
Potassium: 4.3 meq/L (ref 3.5–5.3)
RBC: 5.21 M/uL (ref 4.22–5.81)
RDW: 13.5 % (ref 11.5–15.5)
Sodium: 139 meq/L (ref 135–145)
Total Bilirubin: 0.6 mg/dL (ref 0.3–1.2)
Total Protein: 7.4 g/dL (ref 6.0–8.3)
WBC: 7.7 10*3/uL (ref 4.0–10.5)

## 2010-01-24 ENCOUNTER — Encounter (INDEPENDENT_AMBULATORY_CARE_PROVIDER_SITE_OTHER): Payer: Self-pay | Admitting: *Deleted

## 2010-01-24 ENCOUNTER — Ambulatory Visit: Payer: Self-pay | Admitting: Infectious Diseases

## 2010-03-24 LAB — CONVERTED CEMR LAB
ALT: 80 units/L — ABNORMAL HIGH (ref 0–40)
AST: 51 units/L — ABNORMAL HIGH (ref 0–37)
Albumin: 4.4 g/dL (ref 3.5–5.2)
Alkaline Phosphatase: 131 units/L — ABNORMAL HIGH (ref 39–117)
BUN: 19 mg/dL (ref 6–23)
Basophils Absolute: 0 10*3/uL (ref 0.0–0.1)
Basophils percent auto: 0 % (ref 0–1)
CO2: 30 meq/L (ref 19–32)
Calcium: 10.1 mg/dL (ref 8.4–10.5)
Chlamydia, Swab/Urine, PCR: NEGATIVE
Chloride: 101 meq/L (ref 96–112)
Creatinine, Ser: 1.13 mg/dL (ref 0.40–1.50)
Eosinophils Absolute: 0.2 cells/mcL (ref 0.0–0.7)
Eosinophils Relative: 4 % (ref 0–5)
GC Probe Amp, Urine: NEGATIVE
Glucose, Bld: 155 mg/dL — ABNORMAL HIGH (ref 70–99)
HCT: 46 % (ref 39.0–52.0)
HIV 1 RNA Quant: 173 copies/mL — ABNORMAL HIGH (ref <50)
HIV-1 RNA Quant, Log: 2.24 — ABNORMAL HIGH (ref <1.70)
Hemoglobin: 16.4 g/dL (ref 13.0–17.0)
Leukocyte count, blood: 6.1 10*9/L (ref 4.0–10.5)
Lymphocytes Relative: 26 % (ref 12–46)
Lymphs Abs: 1.6 10*3/uL (ref 0.7–3.3)
MCHC: 35.7 g/dL (ref 30.0–36.0)
MCV: 90 fL (ref 78.0–100.0)
Monocytes Absolute: 0.6 10*3/uL (ref 0.2–0.7)
Monocytes Relative: 10 % (ref 3–11)
Neutro Abs: 3.7 10*3/uL (ref 1.7–7.7)
Neutrophils Relative %: 60 % (ref 43–77)
Platelets: 171 10*3/uL (ref 150–400)
Potassium: 4.5 meq/L (ref 3.5–5.3)
RBC: 5.11 M/uL (ref 4.22–5.81)
RDW: 13.3 % (ref 11.5–14.0)
Sodium: 139 meq/L (ref 135–145)
Total Bilirubin: 0.6 mg/dL (ref 0.3–1.2)
Total Protein: 7.4 g/dL (ref 6.0–8.3)

## 2010-03-28 NOTE — Miscellaneous (Signed)
Summary: RW Financial Update  Clinical Lists Changes  Observations: Added new observation of PCTFPL: 365.65  (01/24/2010 13:24) Added new observation of HOUSEINCOME: 49969  (01/24/2010 13:24) Added new observation of FINASSESSDT: 01/24/2010  (01/24/2010 13:24) Added new observation of YEARLYEXPEN: 3000  (01/24/2010 13:24)

## 2010-03-28 NOTE — Miscellaneous (Addendum)
  Clinical Lists Changes  Orders: Added new Test order of T-CBC w/Diff [id number]) - Signed Added new Test order of T-Comprehensive Metabolic Panel ([id number]) - Signed Added new Test order of T-CD4SP [facility]) (CD4SP) - Signed Added new Test order of T-HIV Viral Load [phone]) - Signed     Process Orders Check Orders Results:     [facility]: ZDG not required for this insurance Tests Sent for requisitioning (January 08, 2010 4:16 PM):     01/08/2010: [facility] -- T-CBC w/Diff [[id number]] (signed)     01/08/2010: [facility] -- T-Comprehensive Metabolic Panel [[id number]] (signed)     01/08/2010: [facility] -- T-HIV Viral Load [phone] (signed)  Omitted diagnosis 042 HIV [place] ( [place])  January 08, 2010 4:20 PM

## 2010-03-28 NOTE — Consult Note (Signed)
Summary: [facility]   Imported By: Bonner Puna 10/04/2009 10:49:38  _____________________________________________________________________  External Attachment:    Type:   Image     Comment:   External Document

## 2010-03-28 NOTE — Miscellaneous (Signed)
Summary: clinical update/ryan white NCADAP appr til 05/25/10  Clinical Lists Changes  Observations: Added new observation of AIDSDAP: Yes 2011 (05/01/2009 12:13)

## 2010-03-28 NOTE — Assessment & Plan Note (Signed)
Summary: est-ck/fu/meds/cfb   CC:  follow-up visit and Depression.  Depression History:      The patient denies a depressed mood most of the day and a diminished interest in his usual daily activities.         Preventive Screening-Counseling & Management  Alcohol-Tobacco     Alcohol drinks/day: 0     Smoking Status: quit     Year Quit: 4 years ago  Caffeine-Diet-Exercise     Caffeine use/day: tea     Does Patient Exercise: yes     Type of exercise: active at work  Paramedic Use: yes   Current Allergies: ! * SUSTIVA Vital Signs:  Patient profile:   55 year old male Height:      70 inches (177.80 cm) Weight:      235.2 pounds (106.91 kg) BMI:     33.87 Temp:     98.2 degrees F (36.78 degrees C) oral Pulse rate:   89 / minute BP sitting:   117 / 77  (left arm)  Vitals Entered By: Rocky Morel) (April 12, 2009 10:27 AM) CC: follow-up visit, Depression Pain Assessment Patient in pain? no      Nutritional Status BMI of > 30 = obese Nutritional Status Detail appetite is good per patient  Have you ever been in a relationship where you felt threatened, hurt or afraid?No   Does patient need assistance? Functional Status Self care Ambulation Normal    Other Orders: Est. Patient Level III (35456) Future Orders: T-CBC w/Diff ([id number]) ... 07/09/2009 T-CD4SP ([facility]) (CD4SP) ... 07/09/2009 T-HIV Viral Load [phone]) ... 07/09/2009 T-Comprehensive Metabolic Panel ([id number]) ... 07/09/2009  Patient Instructions: 1)  Please schedule a follow-up appointment in 4 months. 2)  Be sure to return for lab work one (2) week before your next appointment as scheduled. Process Orders Check Orders Results:     [facility]: ABN not required for this insurance Tests Sent for requisitioning (May 17, 2009 3:16 PM):     07/09/2009: [facility] -- T-CBC w/Diff [[id number]] (signed)     07/09/2009:  [facility] -- T-HIV Viral Load [phone] (signed)     07/09/2009: [facility] -- T-Comprehensive Metabolic Panel [[id number]] (signed)   Process Orders Check Orders Results:     [facility]: ABN not required for this insurance Tests Sent for requisitioning (May 17, 2009 3:16 PM):     07/09/2009: [facility] -- T-CBC w/Diff [[id number]] (signed)     07/09/2009: [facility] -- T-HIV Viral Load [phone] (signed)     07/09/2009: [facility] -- T-Comprehensive Metabolic Panel [[id number]] (signed)

## 2010-04-13 ENCOUNTER — Encounter (INDEPENDENT_AMBULATORY_CARE_PROVIDER_SITE_OTHER): Payer: Self-pay | Admitting: *Deleted

## 2010-04-17 NOTE — Miscellaneous (Signed)
  Clinical Lists Changes

## 2010-05-02 NOTE — Assessment & Plan Note (Signed)
Summary: F/U OV/VS   CC:  f/u .   Current Allergies (reviewed today): ! * SUSTIVA Vital Signs:  Patient profile:   55 year old male Height:      70 inches (177.80 cm) Weight:      251.75 pounds (114.43 kg) BMI:     36.25 Temp:     98.4 degrees F (36.89 degrees C) oral Pulse rate:   97 / minute BP sitting:   127 / 80  (left arm)  Vitals Entered By: Jarrett Ables CMA (January 24, 2010 11:31 AM) CC: f/u  Is Patient Diabetic? Yes Did you bring your meter with you today? No Pain Assessment Patient in pain? no      Nutritional Status BMI of > 30 = obese Nutritional Status Detail nl  Does patient need assistance? Functional Status Self care Ambulation Normal    Medications Added to Medication List This Visit: 1)  Glucotrol Xl 10 Mg Xr24h-tab (Glipizide) .[name].. 1 tablet at breakfast  Other Orders: T-CBC w/Diff [phone]) T-CD4SP [facility]) (CD4SP) T-Comprehensive Metabolic Panel ([id number]) Est. Patient Level IV (21194) Future Orders: T-HIV Viral Load ([id number]) ... 06/17/2010  Patient Instructions: 1)  Please schedule a follow-up appointment in 5 months. 2)  Be sure to return for lab work one (2) week before your next appointment as scheduled.  Process Orders Check Orders Results:     [facility]: EHU not required for this insurance Tests Sent for requisitioning (April 23, 2010 10:50 AM):     01/24/2010: [facility] -- T-CBC w/Diff [[id number]] (signed)     01/24/2010: [facility] -- T-Comprehensive Metabolic Panel [[id number]] (signed)     06/17/2010: [facility] -- T-HIV Viral Load [id number] (signed)

## 2010-05-08 LAB — T-HELPER CELL (CD4) - (RCID CLINIC ONLY)
CD4 % Helper T Cell: 33 % (ref 33–55)
CD4 T Cell Abs: 680 uL (ref 400–2700)

## 2010-05-17 ENCOUNTER — Encounter (INDEPENDENT_AMBULATORY_CARE_PROVIDER_SITE_OTHER): Payer: Self-pay | Admitting: *Deleted

## 2010-05-17 LAB — T-HELPER CELL (CD4) - (RCID CLINIC ONLY)
CD4 % Helper T Cell: 32 % — ABNORMAL LOW (ref 33–55)
CD4 T Cell Abs: 730 uL (ref 400–2700)

## 2010-05-23 NOTE — Miscellaneous (Signed)
  Clinical Lists Changes

## 2010-06-01 LAB — T-HELPER CELL (CD4) - (RCID CLINIC ONLY)
CD4 % Helper T Cell: 30 % — ABNORMAL LOW (ref 33–55)
CD4 T Cell Abs: 460 uL (ref 400–2700)

## 2010-06-03 ENCOUNTER — Telehealth: Payer: Self-pay

## 2010-06-03 NOTE — Telephone Encounter (Signed)
Spoke to patient regarding denial of ADAP and whether he would like to seek other assistance through Patient Assistance Programs.  He arranged to come in later in the week to sign the applications.Marland KitchenMarland KitchenFrancene Boyers

## 2010-06-07 ENCOUNTER — Other Ambulatory Visit: Payer: Self-pay | Admitting: *Deleted

## 2010-06-07 DIAGNOSIS — R110 Nausea: Secondary | ICD-10-CM

## 2010-06-07 DIAGNOSIS — B20 Human immunodeficiency virus [HIV] disease: Secondary | ICD-10-CM

## 2010-06-07 MED ORDER — EFAVIRENZ-EMTRICITAB-TENOFOVIR 600-200-300 MG PO TABS: 1.0000 | ORAL_TABLET | Freq: Every day | ORAL | Status: DC

## 2010-06-07 MED ORDER — PROCHLORPERAZINE MALEATE 10 MG PO TABS: 10.0000 mg | ORAL_TABLET | Freq: Four times a day (QID) | ORAL | Status: DC | PRN

## 2010-06-07 NOTE — Telephone Encounter (Signed)
Need paper rxes for PAP applications. Lorne Skeens, RN

## 2010-06-17 ENCOUNTER — Telehealth: Payer: Self-pay | Admitting: Infectious Diseases

## 2010-06-17 NOTE — Telephone Encounter (Signed)
Applications for Atripla through the [employer] Patient Assistance Program and Compazine through [place] were mailed today.[name]

## 2010-06-26 ENCOUNTER — Other Ambulatory Visit: Payer: Self-pay | Admitting: *Deleted

## 2010-06-26 DIAGNOSIS — R110 Nausea: Secondary | ICD-10-CM

## 2010-06-26 DIAGNOSIS — B20 Human immunodeficiency virus [HIV] disease: Secondary | ICD-10-CM

## 2010-06-26 MED ORDER — PROCHLORPERAZINE MALEATE 10 MG PO TABS: 10.0000 mg | ORAL_TABLET | Freq: Four times a day (QID) | ORAL | Status: DC | PRN

## 2010-06-26 NOTE — Telephone Encounter (Signed)
Called [name] to check status of application for Compazine.  He has been approved, but he needs to contact them with the payment information.  He can get 270 pills for $30.  I need to get a new prescription for a 90 day supply plus however many refills Dr. Orene Desanctis wants to prescribe.  Called [employer] Patient Assistance to check the status of application for Atripla.  The application has not been processed yet.Daniel [name]

## 2010-07-03 ENCOUNTER — Other Ambulatory Visit: Payer: Self-pay | Admitting: *Deleted

## 2010-07-03 DIAGNOSIS — B20 Human immunodeficiency virus [HIV] disease: Secondary | ICD-10-CM

## 2010-07-03 MED ORDER — EFAVIRENZ-EMTRICITAB-TENOFOVIR 600-200-300 MG PO TABS: 1.0000 | ORAL_TABLET | Freq: Every day | ORAL | Status: DC

## 2010-07-03 NOTE — Telephone Encounter (Signed)
Received new prescription for Compazine and have faxed and mailed it to [name].  Also received letter of approval from [place] Patient Assistance.  [name] will receive the Pharmacy Card in the mail and will need to take it with his Prescription to the Pharmacy of his choice.  He has informed.[name]

## 2010-07-05 ENCOUNTER — Other Ambulatory Visit: Payer: Self-pay | Admitting: Emergency Medicine

## 2010-07-05 DIAGNOSIS — R7989 Other specified abnormal findings of blood chemistry: Secondary | ICD-10-CM

## 2010-07-09 ENCOUNTER — Ambulatory Visit
Admission: RE | Admit: 2010-07-09 | Discharge: 2010-07-09 | Disposition: A | Discharge status: Home / Self Care | Payer: No Typology Code available for payment source | Source: Ambulatory Visit | Attending: Emergency Medicine | Admitting: Emergency Medicine

## 2010-07-09 DIAGNOSIS — R7989 Other specified abnormal findings of blood chemistry: Secondary | ICD-10-CM

## 2010-07-17 ENCOUNTER — Telehealth: Payer: Self-pay | Admitting: *Deleted

## 2010-07-17 NOTE — Telephone Encounter (Signed)
Dr. Johnnye Sima received a copy of an abdominal U/S from Dr. Perfecto Kingdom indicating splenomegaly.  Dr. Johnnye Sima has requested that the pt come for labs and f/u appt with him ASAP.  Message left for pt to call office for appts.  Lorne Skeens, RN

## 2010-07-19 ENCOUNTER — Encounter: Payer: No Typology Code available for payment source | Admitting: Hematology and Oncology

## 2010-07-23 ENCOUNTER — Encounter (HOSPITAL_BASED_OUTPATIENT_CLINIC_OR_DEPARTMENT_OTHER): Payer: Self-pay | Admitting: Hematology and Oncology

## 2010-07-23 ENCOUNTER — Other Ambulatory Visit: Payer: Self-pay | Admitting: Hematology and Oncology

## 2010-07-23 DIAGNOSIS — R161 Splenomegaly, not elsewhere classified: Secondary | ICD-10-CM

## 2010-07-23 LAB — COMPREHENSIVE METABOLIC PANEL
ALT: 74 U/L — ABNORMAL HIGH (ref 0–53)
AST: 72 U/L — ABNORMAL HIGH (ref 0–37)
Albumin: 4.1 g/dL (ref 3.5–5.2)
Calcium: 9.6 mg/dL (ref 8.4–10.5)
Chloride: 103 mEq/L (ref 96–112)
Creatinine, Ser: 1.07 mg/dL (ref 0.40–1.50)
Potassium: 3.8 mEq/L (ref 3.5–5.3)
Sodium: 138 mEq/L (ref 135–145)
Total Protein: 6.7 g/dL (ref 6.0–8.3)

## 2010-07-23 LAB — CBC WITH DIFFERENTIAL/PLATELET
Basophils Absolute: 0 10*3/uL (ref 0.0–0.1)
EOS%: 3 % (ref 0.0–7.0)
MCH: 31.7 pg (ref 27.2–33.4)
MCHC: 35 g/dL (ref 32.0–36.0)
MCV: 90.7 fL (ref 79.3–98.0)
MONO%: 9.8 % (ref 0.0–14.0)
RBC: 4.92 10*6/uL (ref 4.20–5.82)
RDW: 12.9 % (ref 11.0–14.6)

## 2010-07-23 LAB — HEPATITIS B SURFACE ANTIGEN: Hepatitis B Surface Ag: NEGATIVE

## 2010-07-23 LAB — HEPATITIS B CORE ANTIBODY, TOTAL: Hep B Core Total Ab: POSITIVE — AB

## 2010-07-23 LAB — HEPATITIS A ANTIBODY, TOTAL: Hep A Total Ab: NEGATIVE

## 2010-07-29 ENCOUNTER — Encounter (HOSPITAL_COMMUNITY)
Admission: RE | Admit: 2010-07-29 | Discharge: 2010-07-29 | Disposition: A | Discharge status: Home / Self Care | Payer: Self-pay | Source: Ambulatory Visit | Attending: Hematology and Oncology | Admitting: Hematology and Oncology

## 2010-07-29 DIAGNOSIS — K7689 Other specified diseases of liver: Secondary | ICD-10-CM | POA: Insufficient documentation

## 2010-07-29 DIAGNOSIS — R161 Splenomegaly, not elsewhere classified: Secondary | ICD-10-CM | POA: Insufficient documentation

## 2010-07-29 DIAGNOSIS — C8589 Other specified types of non-Hodgkin lymphoma, extranodal and solid organ sites: Secondary | ICD-10-CM | POA: Insufficient documentation

## 2010-07-29 MED ORDER — FLUDEOXYGLUCOSE F - 18 (FDG) INJECTION: 17.4000 | Freq: Once | INTRAVENOUS | Status: AC | PRN

## 2010-07-30 ENCOUNTER — Ambulatory Visit (HOSPITAL_COMMUNITY)
Admission: RE | Admit: 2010-07-30 | Discharge: 2010-07-30 | Disposition: A | Discharge status: Home / Self Care | Payer: Self-pay | Source: Ambulatory Visit | Attending: Hematology and Oncology | Admitting: Hematology and Oncology

## 2010-07-30 DIAGNOSIS — R161 Splenomegaly, not elsewhere classified: Secondary | ICD-10-CM

## 2010-07-30 DIAGNOSIS — B20 Human immunodeficiency virus [HIV] disease: Secondary | ICD-10-CM | POA: Insufficient documentation

## 2010-07-30 DIAGNOSIS — K7689 Other specified diseases of liver: Secondary | ICD-10-CM | POA: Insufficient documentation

## 2010-07-30 MED ORDER — GADOBENATE DIMEGLUMINE 529 MG/ML IV SOLN
20.0000 mL | Freq: Once | INTRAVENOUS | Status: AC | PRN
  Administered 2010-07-30: 20 mL via INTRAVENOUS

## 2010-08-01 ENCOUNTER — Encounter (HOSPITAL_BASED_OUTPATIENT_CLINIC_OR_DEPARTMENT_OTHER): Payer: Self-pay | Admitting: Hematology and Oncology

## 2010-08-01 DIAGNOSIS — R161 Splenomegaly, not elsewhere classified: Secondary | ICD-10-CM

## 2010-08-01 DIAGNOSIS — B20 Human immunodeficiency virus [HIV] disease: Secondary | ICD-10-CM

## 2010-08-01 DIAGNOSIS — R5382 Chronic fatigue, unspecified: Secondary | ICD-10-CM

## 2010-08-27 ENCOUNTER — Other Ambulatory Visit (INDEPENDENT_AMBULATORY_CARE_PROVIDER_SITE_OTHER): Payer: Self-pay

## 2010-08-27 DIAGNOSIS — B20 Human immunodeficiency virus [HIV] disease: Secondary | ICD-10-CM

## 2010-08-28 LAB — CBC WITH DIFFERENTIAL/PLATELET
Eosinophils Relative: 2 % (ref 0–5)
HCT: 46.8 % (ref 39.0–52.0)
Hemoglobin: 16.6 g/dL (ref 13.0–17.0)
Lymphocytes Relative: 25 % (ref 12–46)
MCHC: 35.5 g/dL (ref 30.0–36.0)
MCV: 88.8 fL (ref 78.0–100.0)
Monocytes Absolute: 0.8 10*3/uL (ref 0.1–1.0)
Monocytes Relative: 10 % (ref 3–12)
Neutro Abs: 4.9 10*3/uL (ref 1.7–7.7)
WBC: 7.9 10*3/uL (ref 4.0–10.5)

## 2010-08-28 LAB — BASIC METABOLIC PANEL WITH GFR
BUN: 19 mg/dL (ref 6–23)
CO2: 25 mEq/L (ref 19–32)
Chloride: 101 mEq/L (ref 96–112)
Creat: 1.27 mg/dL (ref 0.50–1.35)
GFR, Est Non African American: 59 mL/min — ABNORMAL LOW (ref >60)
Potassium: 4 mEq/L (ref 3.5–5.3)

## 2010-08-29 LAB — T-HELPER CELL (CD4) - (RCID CLINIC ONLY): CD4 T Cell Abs: 770 uL (ref 400–2700)

## 2010-08-29 LAB — HIV-1 RNA QUANT-NO REFLEX-BLD: HIV-1 RNA Quant, Log: <1.3 {Log} (ref <1.30)

## 2010-09-09 ENCOUNTER — Ambulatory Visit (INDEPENDENT_AMBULATORY_CARE_PROVIDER_SITE_OTHER): Payer: Self-pay | Admitting: Infectious Diseases

## 2010-09-09 ENCOUNTER — Encounter: Payer: Self-pay | Admitting: Infectious Diseases

## 2010-09-09 DIAGNOSIS — Z79899 Other long term (current) drug therapy: Secondary | ICD-10-CM

## 2010-09-09 DIAGNOSIS — Z23 Encounter for immunization: Secondary | ICD-10-CM

## 2010-09-09 DIAGNOSIS — B20 Human immunodeficiency virus [HIV] disease: Secondary | ICD-10-CM

## 2010-09-09 DIAGNOSIS — K7689 Other specified diseases of liver: Secondary | ICD-10-CM

## 2010-09-09 DIAGNOSIS — K760 Fatty (change of) liver, not elsewhere classified: Secondary | ICD-10-CM | POA: Insufficient documentation

## 2010-09-09 NOTE — Progress Notes (Signed)
Addended by: Myrtis Hopping A on: 09/09/2010 12:44 PM   Modules accepted: Orders

## 2010-09-09 NOTE — Progress Notes (Signed)
Addended by: Myrtis Hopping A on: 09/09/2010 10:29 AM   Modules accepted: Orders

## 2010-09-09 NOTE — Assessment & Plan Note (Addendum)
He is doing well. We need a copy of his cholesterol. Will add it to his next labs. He is not sexually active. Will start Hep A vaccine series. He had his Fluvax October 2011. Suspect his fatigue is related to his DM which in turn is related to his dental infection.  We return to clinic in 5-6 months.

## 2010-09-09 NOTE — Assessment & Plan Note (Signed)
He will continue to follow up with his GI physician.

## 2010-09-09 NOTE — Progress Notes (Signed)
  Subjective:    Patient ID: Daniel [name], male    DOB: [birth year], 55 y.o.   MRN: [id number]  HPI 55 yo M with hx of abn LFTs, hepatic steatosis, DM2, depression and HIV+ since 2002 (pt unsure). He is currently taking Atripla although his allergies state he is allergic to EFV. His last CD4 770 and VL < 20 (08-27-10). He also had AFP 3.8 (NL) 07-23-10. He had MRI of liver: "Stable nonspecific hepatosplenomegaly with diffuse hepatic steatosis". States he has been feeling tired, otherwise without complaint. FSGs have been running high- 270s. Has infection in his mouth- for 1 year- teeth getting loose. Having 3 extractions done this week.    Review of Systems  Constitutional: Negative for unexpected weight change.  Respiratory: Positive for shortness of breath (DOE climbing steps). Negative for chest tightness.   Gastrointestinal: Negative for diarrhea and constipation.  Genitourinary: Negative for dysuria and difficulty urinating.  ophtho last year.     Objective:   Physical Exam  Constitutional: He appears well-developed and well-nourished.  Eyes: EOM are normal. Pupils are equal, round, and reactive to light.  Neck: Neck supple.  Cardiovascular: Normal rate, regular rhythm and normal heart sounds.   Pulmonary/Chest: Effort normal and breath sounds normal. No respiratory distress. He has no wheezes.  Abdominal: Soft. Bowel sounds are normal. He exhibits distension. There is no tenderness. There is no rebound.  Musculoskeletal: He exhibits edema.  Lymphadenopathy:    He has no cervical adenopathy.          Assessment & Plan:

## 2010-11-21 LAB — T-HELPER CELL (CD4) - (RCID CLINIC ONLY)
CD4 % Helper T Cell: 30 — ABNORMAL LOW
CD4 T Cell Abs: 370 — ABNORMAL LOW

## 2010-11-29 LAB — T-HELPER CELL (CD4) - (RCID CLINIC ONLY): CD4 T Cell Abs: 500 uL (ref 400–2700)

## 2010-12-03 ENCOUNTER — Other Ambulatory Visit: Payer: Self-pay | Admitting: *Deleted

## 2010-12-03 DIAGNOSIS — B20 Human immunodeficiency virus [HIV] disease: Secondary | ICD-10-CM

## 2010-12-03 MED ORDER — EFAVIRENZ-EMTRICITAB-TENOFOVIR 600-200-300 MG PO TABS: 1.0000 | ORAL_TABLET | Freq: Every day | ORAL | Status: DC

## 2011-01-14 ENCOUNTER — Other Ambulatory Visit: Payer: Self-pay | Admitting: Infectious Diseases

## 2011-01-14 DIAGNOSIS — Z113 Encounter for screening for infections with a predominantly sexual mode of transmission: Secondary | ICD-10-CM

## 2011-01-23 ENCOUNTER — Other Ambulatory Visit: Payer: Self-pay

## 2011-01-25 ENCOUNTER — Telehealth: Payer: Self-pay | Admitting: Hematology and Oncology

## 2011-01-25 NOTE — Telephone Encounter (Signed)
lmonvm adviisng the pt of his march 2013 appts

## 2011-01-27 ENCOUNTER — Other Ambulatory Visit (INDEPENDENT_AMBULATORY_CARE_PROVIDER_SITE_OTHER): Payer: Self-pay

## 2011-01-27 ENCOUNTER — Other Ambulatory Visit: Payer: Self-pay | Admitting: Infectious Diseases

## 2011-01-27 DIAGNOSIS — Z79899 Other long term (current) drug therapy: Secondary | ICD-10-CM

## 2011-01-27 DIAGNOSIS — K7689 Other specified diseases of liver: Secondary | ICD-10-CM

## 2011-01-27 DIAGNOSIS — K760 Fatty (change of) liver, not elsewhere classified: Secondary | ICD-10-CM

## 2011-01-27 DIAGNOSIS — B20 Human immunodeficiency virus [HIV] disease: Secondary | ICD-10-CM

## 2011-01-27 DIAGNOSIS — Z113 Encounter for screening for infections with a predominantly sexual mode of transmission: Secondary | ICD-10-CM

## 2011-01-28 LAB — CBC WITH DIFFERENTIAL/PLATELET
Basophils Relative: 0 % (ref 0–1)
HCT: 45.4 % (ref 39.0–52.0)
Hemoglobin: 15.8 g/dL (ref 13.0–17.0)
MCHC: 34.8 g/dL (ref 30.0–36.0)
MCV: 94.2 fL (ref 78.0–100.0)
Monocytes Absolute: 0.4 10*3/uL (ref 0.1–1.0)
Monocytes Relative: 7 % (ref 3–12)
Neutro Abs: 4.2 10*3/uL (ref 1.7–7.7)

## 2011-01-28 LAB — COMPREHENSIVE METABOLIC PANEL
Albumin: 4.2 g/dL (ref 3.5–5.2)
Alkaline Phosphatase: 154 U/L — ABNORMAL HIGH (ref 39–117)
CO2: 27 mEq/L (ref 19–32)
Calcium: 9.1 mg/dL (ref 8.4–10.5)
Chloride: 105 mEq/L (ref 96–112)
Glucose, Bld: 335 mg/dL — ABNORMAL HIGH (ref 70–99)
Potassium: 4.6 mEq/L (ref 3.5–5.3)
Sodium: 142 mEq/L (ref 135–145)
Total Protein: 6.4 g/dL (ref 6.0–8.3)

## 2011-01-28 LAB — LIPID PANEL
Cholesterol: 181 mg/dL (ref 0–200)
LDL Cholesterol: 68 mg/dL (ref 0–99)
Triglycerides: 345 mg/dL — ABNORMAL HIGH (ref <150)

## 2011-01-28 LAB — RPR

## 2011-01-28 LAB — T-HELPER CELL (CD4) - (RCID CLINIC ONLY): CD4 % Helper T Cell: 33 % (ref 33–55)

## 2011-01-29 LAB — HIV-1 RNA QUANT-NO REFLEX-BLD: HIV-1 RNA Quant, Log: <1.3 {Log} (ref <1.30)

## 2011-02-06 ENCOUNTER — Ambulatory Visit: Payer: Self-pay | Admitting: Infectious Diseases

## 2011-02-06 ENCOUNTER — Telehealth: Payer: Self-pay | Admitting: *Deleted

## 2011-02-06 ENCOUNTER — Other Ambulatory Visit: Payer: Self-pay | Admitting: Infectious Diseases

## 2011-02-06 DIAGNOSIS — B20 Human immunodeficiency virus [HIV] disease: Secondary | ICD-10-CM

## 2011-02-06 NOTE — Telephone Encounter (Signed)
He missed his appt & has rescheduled. Wants a copy of his recent labs. He will pick them up tomorrow. Told him this was ok. Copies at front

## 2011-02-14 ENCOUNTER — Ambulatory Visit: Payer: Self-pay

## 2011-02-14 DIAGNOSIS — I10 Essential (primary) hypertension: Secondary | ICD-10-CM

## 2011-02-14 DIAGNOSIS — E782 Mixed hyperlipidemia: Secondary | ICD-10-CM

## 2011-02-14 DIAGNOSIS — Z79899 Other long term (current) drug therapy: Secondary | ICD-10-CM

## 2011-03-27 ENCOUNTER — Telehealth: Payer: Self-pay | Admitting: *Deleted

## 2011-03-27 ENCOUNTER — Ambulatory Visit: Payer: Self-pay | Admitting: Infectious Diseases

## 2011-03-27 NOTE — Telephone Encounter (Signed)
Called patient to reschedule his appointment.  He missed today's appointment. Myrtis Hopping CMA

## 2011-04-07 ENCOUNTER — Other Ambulatory Visit: Payer: Self-pay | Admitting: *Deleted

## 2011-04-07 DIAGNOSIS — E119 Type 2 diabetes mellitus without complications: Secondary | ICD-10-CM

## 2011-04-07 MED ORDER — GLIPIZIDE ER 10 MG PO TB24: 10.0000 mg | ORAL_TABLET | Freq: Every day | ORAL | Status: DC

## 2011-04-22 ENCOUNTER — Other Ambulatory Visit: Payer: Self-pay | Admitting: *Deleted

## 2011-04-22 MED ORDER — LOSARTAN POTASSIUM 50 MG PO TABS: 50.0000 mg | ORAL_TABLET | Freq: Every day | ORAL | Status: DC

## 2011-05-01 ENCOUNTER — Other Ambulatory Visit: Payer: Self-pay | Admitting: Lab

## 2011-05-06 ENCOUNTER — Ambulatory Visit: Payer: Self-pay | Admitting: Hematology and Oncology

## 2011-05-06 ENCOUNTER — Other Ambulatory Visit: Payer: Self-pay | Admitting: Lab

## 2011-05-19 ENCOUNTER — Other Ambulatory Visit (HOSPITAL_BASED_OUTPATIENT_CLINIC_OR_DEPARTMENT_OTHER): Payer: Self-pay | Admitting: Lab

## 2011-05-19 DIAGNOSIS — R161 Splenomegaly, not elsewhere classified: Secondary | ICD-10-CM

## 2011-05-19 LAB — COMPREHENSIVE METABOLIC PANEL
ALT: 56 U/L — ABNORMAL HIGH (ref 0–53)
CO2: 30 mEq/L (ref 19–32)
Calcium: 10.2 mg/dL (ref 8.4–10.5)
Chloride: 103 mEq/L (ref 96–112)
Potassium: 4.5 mEq/L (ref 3.5–5.3)
Sodium: 139 mEq/L (ref 135–145)
Total Protein: 6.8 g/dL (ref 6.0–8.3)

## 2011-05-19 LAB — CBC WITH DIFFERENTIAL/PLATELET
BASO%: 0.4 % (ref 0.0–2.0)
HCT: 49.1 % (ref 38.4–49.9)
MCHC: 34.8 g/dL (ref 32.0–36.0)
MONO#: 0.7 10*3/uL (ref 0.1–0.9)
RBC: 5.19 10*6/uL (ref 4.20–5.82)
WBC: 8.1 10*3/uL (ref 4.0–10.3)
lymph#: 2 10*3/uL (ref 0.9–3.3)

## 2011-05-20 ENCOUNTER — Telehealth: Payer: Self-pay | Admitting: Infectious Diseases

## 2011-05-20 NOTE — Telephone Encounter (Signed)
Called patient.  Left V/M to return my call so we can set up an appointment to fill out a renewal application for his Compizine

## 2011-05-23 ENCOUNTER — Ambulatory Visit (HOSPITAL_BASED_OUTPATIENT_CLINIC_OR_DEPARTMENT_OTHER): Payer: Self-pay | Admitting: Physician Assistant

## 2011-05-23 VITALS — BP 119/73 | HR 106 | Temp 98.2°F | Ht 70.0 in | Wt 253.7 lb

## 2011-05-23 DIAGNOSIS — K760 Fatty (change of) liver, not elsewhere classified: Secondary | ICD-10-CM

## 2011-05-23 DIAGNOSIS — R161 Splenomegaly, not elsewhere classified: Secondary | ICD-10-CM

## 2011-05-23 DIAGNOSIS — K7689 Other specified diseases of liver: Secondary | ICD-10-CM

## 2011-05-23 NOTE — Progress Notes (Signed)
This office note has been dictated.

## 2011-05-23 NOTE — Progress Notes (Signed)
CC:   Daniel [name]. Daniel [name], M.D. Alison Murray, M.D. Chucky May, M.D. James L. Rolla Flatten., M.D.  IDENTIFYING STATEMENT:  [name] is a 56 year old white male with HIV and hepatic steatosis who presents for followup.  INTERIM HISTORY:  Mr. Weekly reports since his last clinic visit in June 2012, he has had overall normal energy level.  He continues to work.  He has had no fevers, chills, or night sweats.  No dyspnea or cough.  He has normal appetite and has had no issues with nausea, vomiting, constipation, or diarrhea.  He does report malodorous stools and states that he is scheduled for followup with his primary physician, Dr. Everlene [name], on Monday, May 26, 2011.  He states he was last evaluated by his gastroenterologist, Dr. Oletta [name], in January 2013 and will have another followup appointment in January 2014.  He has had no dysuria, no frequency or hematuria.  No alteration in sensation or balance or swelling of extremities.  CURRENT MEDICATIONS:  Reviewed and recorded.  The patient also states that he has had elevated blood sugars, but he states that he has a tendency to eat a lot of ice cream and he attributes the elevated blood sugars to this.  He does state that he is compliant with his diabetes medications.  PHYSICAL EXAMINATION:  Vital Signs:  Temperature today 98.2, heart rate 106, respirations 20, blood pressure 119/73, weight 253.7 pounds. General:  This is a well-developed, well-nourished white male in no acute distress HEENT:  Sclerae nonicteric.  There is no oral thrush or mucositis.  Skin:  No rash or lesions.  Lymph:  No cervical, supraclavicular, axillary, or inguinal lymphadenopathy.  Cardiac: Regular rate and rhythm without murmurs or gallops.  Peripheral pulses are 2+.  Chest:  Lungs are clear to auscultation.  Abdomen:  Obese with positive bowel sounds.  There is no appreciable organomegaly. Extremities:  Without edema, cyanosis, or calf tenderness.   Neuro: Alert and oriented x3.  Strength, sensation, and coordination all grossly intact.  LABORATORIES:  CBC with diff reveals white blood count of 8.1, hemoglobin 17.1, hematocrit 49.1, platelets of 156, ANC of 5.2, and MCV of 94.7.  Chemistries reveal a sodium of 139, potassium 4.5, chloride of 103, BUN 16, creatinine 1.19, glucose of 194, bilirubin 0.6 alkaline phosphatase 141, AST 44, ALT 56, total protein 6.8, albumin 4.3, and calcium of 10.2.  IMPRESSION/PLAN: 1. Daniel [name] is a 56 year old white male with human     immunodeficiency virus, found to have splenomegaly.  He had     followup radiographic studies in June 2012 which revealed hepatic     steatosis.  He was evaluated by a gastroenterologist, Dr. [name], and remains under his care as well.  Last evaluation in     January 2013.  He has had stable liver function tests since his     last clinic visit in June. 2. Per Dr. Jamse Arn, the patient will follow up at the [facility] on     a p.r.n. basis only and will return to the care of his primary     physician, Dr. Everlene [name], as well as Dr. Oletta [name] for further monitoring     of his liver function tests. 3. For the patient's human immunodeficiency virus, he remains on HAART     therapy and is followed by Dr. Lars Mage, Infectious Disease. 4. The patient reports no change in bowel habits.  However, he has had  malodorous stools with no change in diet or medications.  He plans     to follow up with his primary physician on May 26, 2011.  Of note,     the patient states he has never had a colonoscopy and he is advised     that colonoscopies are generally done baseline at age 65 and he     states that he has been told in the past that he needed to have a     colonoscopy, but he cannot at this time for financial reasons.    ______________________________ Sinda Du, MSN, ANP, BC RH/MEDQ  D:  05/23/2011  T:  05/23/2011  Job:  [id number]

## 2011-05-29 ENCOUNTER — Ambulatory Visit (INDEPENDENT_AMBULATORY_CARE_PROVIDER_SITE_OTHER): Payer: Self-pay | Admitting: Infectious Diseases

## 2011-05-29 ENCOUNTER — Encounter: Payer: Self-pay | Admitting: Infectious Diseases

## 2011-05-29 VITALS — BP 119/75 | HR 80 | Temp 98.2°F | Ht 70.0 in | Wt 249.0 lb

## 2011-05-29 DIAGNOSIS — Z23 Encounter for immunization: Secondary | ICD-10-CM

## 2011-05-29 DIAGNOSIS — B20 Human immunodeficiency virus [HIV] disease: Secondary | ICD-10-CM

## 2011-05-29 NOTE — Progress Notes (Signed)
Patient ID: Daniel [name], male   DOB: [birth year], 56 y.o.   MRN: [id number] BP 119/75  Pulse 80  Temp(Src) 98.2 F (36.8 C) (Oral)  Ht 5' 10"  (1.778 m)  Wt 249 lb (112.946 kg)  BMI 35.73 kg/m2 Daniel [name] is here for f/u of his HIV infection and he has been adherent to his Truvada and Viramune. His HIV was 30 but CD4 stable at > 700. He is followed for his DM by Dr. Nena Jordan and is on glipizide and metformin. His nonfasting random BS was 320 the other week but tellls me that his A1c is 6.5%. Exam: Alert and oriented. Conversant and quite funny as usual. He has no adenopathy. Obese abdomen but no palpable spleen or liver.   Impression: Suppressed HIV and will continue Meds but change to Atripla and d/c Viramune and Truvada Plan f/u in5-6 months. Lars Mage

## 2011-06-02 ENCOUNTER — Other Ambulatory Visit: Payer: Self-pay | Admitting: *Deleted

## 2011-06-02 ENCOUNTER — Telehealth: Payer: Self-pay | Admitting: Internal Medicine

## 2011-06-02 DIAGNOSIS — B20 Human immunodeficiency virus [HIV] disease: Secondary | ICD-10-CM

## 2011-06-02 DIAGNOSIS — R110 Nausea: Secondary | ICD-10-CM

## 2011-06-02 MED ORDER — PROCHLORPERAZINE MALEATE 10 MG PO TABS: 10.0000 mg | ORAL_TABLET | Freq: Four times a day (QID) | ORAL | Status: DC | PRN

## 2011-06-02 MED ORDER — EFAVIRENZ-EMTRICITAB-TENOFOVIR 600-200-300 MG PO TABS: 1.0000 | ORAL_TABLET | Freq: Every day | ORAL | Status: DC

## 2011-06-02 NOTE — Telephone Encounter (Signed)
Faxed and mailed applications to [employer] Patient Assistance and [name] Patient Assistance today.  Will call 06-05-11 to check status on applications

## 2011-06-05 ENCOUNTER — Telehealth: Payer: Self-pay | Admitting: Internal Medicine

## 2011-06-05 NOTE — Telephone Encounter (Signed)
Called [employer] Patient Assistance to check status on application.  Told it had not been received.  Refaxed today.  Will call back Monday to see if received.

## 2011-06-05 NOTE — Telephone Encounter (Signed)
Received fax from [place] Patient Assistance requesting more info from patient.  Called Daniel [name].  Asked about the other 2 household members.  It is his mother and his aunt.  Also told him I need a copy of his 2012 tax returns.  He will bring them by tomorrow and leave with the front desk.

## 2011-06-09 ENCOUNTER — Telehealth: Payer: Self-pay | Admitting: Infectious Diseases

## 2011-06-09 ENCOUNTER — Other Ambulatory Visit: Payer: Self-pay | Admitting: Physician Assistant

## 2011-06-09 NOTE — Telephone Encounter (Signed)
Called [employer] again today.  Still saying they need more financial information.  Called and left V/M for Mr. Philbrook asking him for this information.

## 2011-06-09 NOTE — Telephone Encounter (Signed)
Faxed copy of [name] and his 2012 tax return to [employer]

## 2011-06-09 NOTE — Telephone Encounter (Signed)
Received fax from [place] Patient Assistance.  Daniel [name] has been approved for temporary assistance  06-09-11 - 09-07-11. They are sending him his temporary pharmacy card.

## 2011-06-28 ENCOUNTER — Other Ambulatory Visit: Payer: Self-pay | Admitting: Physician Assistant

## 2011-07-02 ENCOUNTER — Telehealth: Payer: Self-pay

## 2011-07-02 NOTE — Telephone Encounter (Signed)
Patient's Daniel [name] expired 01/25/11 - he has not renewed as income has been over 300% FPL - was also denied for ADAP - was told by Roselyn Reef to take out of P/F. [employer] wrote off part of bill where dates of service were applicable.

## 2011-07-11 ENCOUNTER — Ambulatory Visit: Payer: Self-pay | Admitting: Emergency Medicine

## 2011-07-11 VITALS — BP 134/83 | HR 102 | Temp 99.1°F | Resp 16 | Ht 71.18 in | Wt 243.0 lb

## 2011-07-11 DIAGNOSIS — E119 Type 2 diabetes mellitus without complications: Secondary | ICD-10-CM

## 2011-07-11 DIAGNOSIS — J329 Chronic sinusitis, unspecified: Secondary | ICD-10-CM

## 2011-07-11 DIAGNOSIS — R7989 Other specified abnormal findings of blood chemistry: Secondary | ICD-10-CM

## 2011-07-11 DIAGNOSIS — I10 Essential (primary) hypertension: Secondary | ICD-10-CM

## 2011-07-11 DIAGNOSIS — J40 Bronchitis, not specified as acute or chronic: Secondary | ICD-10-CM

## 2011-07-11 DIAGNOSIS — G629 Polyneuropathy, unspecified: Secondary | ICD-10-CM

## 2011-07-11 LAB — POCT CBC
Granulocyte percent: 68.7 %G (ref 37–80)
MCH, POC: 32.5 pg — AB (ref 27–31.2)
MCV: 95.5 fL (ref 80–97)
MID (cbc): 1 — AB (ref 0–0.9)
MPV: 7.8 fL (ref 0–99.8)
POC LYMPH PERCENT: 23.1 %L (ref 10–50)
POC MID %: 8.2 %M (ref 0–12)
Platelet Count, POC: 197 10*3/uL (ref 142–424)
RBC: 4.93 M/uL (ref 4.69–6.13)
RDW, POC: 14.3 %
WBC: 11.7 10*3/uL — AB (ref 4.6–10.2)

## 2011-07-11 LAB — COMPREHENSIVE METABOLIC PANEL
Alkaline Phosphatase: 147 U/L — ABNORMAL HIGH (ref 39–117)
CO2: 25 mEq/L (ref 19–32)
Creat: 1.08 mg/dL (ref 0.50–1.35)
Glucose, Bld: 149 mg/dL — ABNORMAL HIGH (ref 70–99)
Total Bilirubin: 0.7 mg/dL (ref 0.3–1.2)

## 2011-07-11 MED ORDER — GABAPENTIN 300 MG PO CAPS: ORAL_CAPSULE | ORAL | Status: DC

## 2011-07-11 MED ORDER — AMOXICILLIN-POT CLAVULANATE 875-125 MG PO TABS: 1.0000 | ORAL_TABLET | Freq: Two times a day (BID) | ORAL | Status: AC

## 2011-07-11 MED ORDER — LOSARTAN POTASSIUM 100 MG PO TABS: ORAL_TABLET | ORAL | Status: DC

## 2011-07-11 NOTE — Progress Notes (Signed)
Subjective:    Patient ID: Daniel [name], male    DOB: 04-Mar-1955, 56 y.o.   MRN: [id number]  HPI patient enters with head congestion purulent nasal drainage cough which has been productive of moderate amounts of discolored phlegm. He may be running a low-grade fever but overall is been doing okay and able to work. He does have a history of HIV disease has regular checkups and his CD4 counts have been stable.    Review of Systems he also needs followup of his diabetes. He has not been the best about watching his diet.     Objective:   Physical Exam  Constitutional: He appears well-developed and well-nourished.  HENT:       Her TMs are dull with fluid.  Eyes: Pupils are equal, round, and reactive to light.  Neck: No tracheal deviation present. No thyromegaly present.  Cardiovascular: Normal rate and regular rhythm.   Pulmonary/Chest: Effort normal and breath sounds normal. No respiratory distress. He has no wheezes. He has no rales. He exhibits no tenderness.    Results for orders placed in visit on 07/11/11  POCT CBC      Component Value Range   WBC 11.7 (*) 4.6 - 10.2 (K/uL)   Lymph, poc 2.7  0.6 - 3.4    POC LYMPH PERCENT 23.1  10 - 50 (%L)   MID (cbc) 1.0 (*) 0 - 0.9    POC MID % 8.2  0 - 12 (%M)   POC Granulocyte 8.0 (*) 2 - 6.9    Granulocyte percent 68.7  37 - 80 (%G)   RBC 4.93  4.69 - 6.13 (M/uL)   Hemoglobin 16.0  14.1 - 18.1 (g/dL)   HCT, POC 47.1  43.5 - 53.7 (%)   MCV 95.5  80 - 97 (fL)   MCH, POC 32.5 (*) 27 - 31.2 (pg)   MCHC 34.0  31.8 - 35.4 (g/dL)   RDW, POC 14.3     Platelet Count, POC 197  142 - 424 (K/uL)   MPV 7.8  0 - 99.8 (fL)  POCT GLYCOSYLATED HEMOGLOBIN (HGB A1C)      Component Value Range   Hemoglobin A1C 6.2.    GLUCOSE, POCT (MANUAL RESULT ENTRY)      Component Value Range   POC Glucose 147 (*) 70 - 99 (mg/dl)   Results for orders placed in visit on 07/11/11  POCT CBC      Component Value Range   WBC 11.7 (*) 4.6 - 10.2 (K/uL)   Lymph,  poc 2.7  0.6 - 3.4    POC LYMPH PERCENT 23.1  10 - 50 (%L)   MID (cbc) 1.0 (*) 0 - 0.9    POC MID % 8.2  0 - 12 (%M)   POC Granulocyte 8.0 (*) 2 - 6.9    Granulocyte percent 68.7  37 - 80 (%G)   RBC 4.93  4.69 - 6.13 (M/uL)   Hemoglobin 16.0  14.1 - 18.1 (g/dL)   HCT, POC 47.1  43.5 - 53.7 (%)   MCV 95.5  80 - 97 (fL)   MCH, POC 32.5 (*) 27 - 31.2 (pg)   MCHC 34.0  31.8 - 35.4 (g/dL)   RDW, POC 14.3     Platelet Count, POC 197  142 - 424 (K/uL)   MPV 7.8  0 - 99.8 (fL)  POCT GLYCOSYLATED HEMOGLOBIN (HGB A1C)      Component Value Range   Hemoglobin A1C 6.2.    GLUCOSE,  POCT (MANUAL RESULT ENTRY)      Component Value Range   POC Glucose 147 (*) 70 - 99 (mg/dl)       Assessment & Plan:  His diabetes is doing well hemoglobin A1c was 6 point 2. He does have an acute respiratory infection with otitis media and sinusitis and bronchitis. His white count was slightly elevated to reflect this. We'll treat with Augmentin and Mucinex.

## 2011-07-16 ENCOUNTER — Other Ambulatory Visit: Payer: Self-pay | Admitting: Physician Assistant

## 2011-08-11 ENCOUNTER — Telehealth: Payer: Self-pay | Admitting: Infectious Diseases

## 2011-08-11 NOTE — Telephone Encounter (Signed)
Received fax from [place] Patient Assistance stating Mr. Offord temporary enrollment is up 09-07-11.  I called [employer] to see why it is temporary.  They said he has a household of 3. I told them on his tax return it states a family of 1.  I re-faxed his tax returns to [place] so they can extend his enrollment.

## 2011-08-12 ENCOUNTER — Telehealth: Payer: Self-pay | Admitting: Internal Medicine

## 2011-08-12 NOTE — Telephone Encounter (Signed)
Received fax from [place]   Daniel [name] has been approved 06-09-11 - 06-08-12.

## 2011-08-26 ENCOUNTER — Ambulatory Visit: Payer: Self-pay

## 2011-10-20 ENCOUNTER — Other Ambulatory Visit: Payer: Self-pay | Admitting: Physician Assistant

## 2011-11-18 ENCOUNTER — Other Ambulatory Visit: Payer: Self-pay | Admitting: Physician Assistant

## 2011-12-04 ENCOUNTER — Ambulatory Visit: Payer: Self-pay | Admitting: Emergency Medicine

## 2011-12-04 VITALS — BP 124/76 | HR 91 | Temp 98.0°F | Resp 18 | Ht 71.0 in | Wt 241.0 lb

## 2011-12-04 DIAGNOSIS — I10 Essential (primary) hypertension: Secondary | ICD-10-CM

## 2011-12-04 DIAGNOSIS — E119 Type 2 diabetes mellitus without complications: Secondary | ICD-10-CM

## 2011-12-04 DIAGNOSIS — Z23 Encounter for immunization: Secondary | ICD-10-CM

## 2011-12-04 DIAGNOSIS — E785 Hyperlipidemia, unspecified: Secondary | ICD-10-CM

## 2011-12-04 LAB — GLUCOSE, POCT (MANUAL RESULT ENTRY): POC Glucose: 108 mg/dl — AB (ref 70–99)

## 2011-12-04 LAB — POCT GLYCOSYLATED HEMOGLOBIN (HGB A1C): Hemoglobin A1C: 5.8

## 2011-12-04 NOTE — Progress Notes (Signed)
  Subjective:    Patient ID: Daniel [name], male    DOB: August 07, 1955, 56 y.o.   MRN: [id number]  HPI Patient in for followup of his diabetes. He also has HIV disease and sees Dr. Orene Desanctis for this. He also has depression and is followed by Dr. Toy Care. He recently stopped his Abilify he feels better since stopping this. He's not sure how he's doing with his diabetes   Review of Systems     Objective:   Physical Exam HEENT exam is unremarkable neck supple chest clear heart regular rate no murmurs abdomen soft liver and spleen not enlarged examination of the extremities reveals some callus formation over the medial portion of both great toes. He does have decreased sensation in both feet Results for orders placed in visit on 12/04/11  GLUCOSE, POCT (MANUAL RESULT ENTRY)      Component Value Range   POC Glucose 108 (*) 70 - 99 mg/dl  POCT GLYCOSYLATED HEMOGLOBIN (HGB A1C)      Component Value Range   Hemoglobin A1C 5.8     He has bilateral ingrown toenails of the great toes he has a patch of Tinea corporis over the top of his left foot     Assessment & Plan:  Diabetes is a goal. No changes current medications. I advised him to call Dr Toy Care  regarding his other medication .recheck 4 months

## 2011-12-05 ENCOUNTER — Encounter: Payer: Self-pay | Admitting: *Deleted

## 2011-12-05 LAB — LIPID PANEL
Cholesterol: 193 mg/dL (ref 0–200)
LDL Cholesterol: 108 mg/dL — ABNORMAL HIGH (ref 0–99)
Total CHOL/HDL Ratio: 4.4 Ratio
VLDL: 41 mg/dL — ABNORMAL HIGH (ref 0–40)

## 2011-12-05 LAB — COMPREHENSIVE METABOLIC PANEL
ALT: 37 U/L (ref 0–53)
AST: 29 U/L (ref 0–37)
Alkaline Phosphatase: 130 U/L — ABNORMAL HIGH (ref 39–117)
CO2: 26 mEq/L (ref 19–32)
Creat: 1.14 mg/dL (ref 0.50–1.35)
Sodium: 140 mEq/L (ref 135–145)
Total Bilirubin: 0.5 mg/dL (ref 0.3–1.2)
Total Protein: 6.7 g/dL (ref 6.0–8.3)

## 2011-12-19 ENCOUNTER — Other Ambulatory Visit: Payer: Self-pay | Admitting: Physician Assistant

## 2012-02-04 ENCOUNTER — Ambulatory Visit: Payer: Self-pay | Admitting: Emergency Medicine

## 2012-02-04 VITALS — BP 118/78 | HR 86 | Temp 98.0°F | Resp 16 | Ht 71.0 in | Wt 239.0 lb

## 2012-02-04 DIAGNOSIS — G609 Hereditary and idiopathic neuropathy, unspecified: Secondary | ICD-10-CM

## 2012-02-04 DIAGNOSIS — G629 Polyneuropathy, unspecified: Secondary | ICD-10-CM

## 2012-02-04 MED ORDER — GABAPENTIN 300 MG PO CAPS: ORAL_CAPSULE | ORAL | Status: DC

## 2012-02-04 NOTE — Progress Notes (Signed)
  Subjective:    Patient ID: Daniel [name], male    DOB: 18-Feb-1956, 56 y.o.   MRN: [id number]  HPI patient enters with severe discomfort from the knees down on both legs. Pertinent history reveals patient has HIV disease and is on multiple medications for this. He's also diabetic and this has been controlled. He has severe burning sensation console since the up towards both knees. His hourly on Neurontin he takes 2 pills in the morning and 2 in the    Review of Systems     Objective:   Physical Exam stasis changes with varicosities of both lower extremities. His pulses dorsalis pedis and posterior tibial are 2+ and symmetrical. He has no sensation on the soles of either foot and starts to get sensation about mid calf bilaterally. Position sense is maintained in the right side but not the left side. Vibratory sense testing reveals 50% sensation in the right foot 25% sensation in the left foot and        Assessment & Plan:  Patient has an obvious peripheral neuropathy. I'm not sure if this is related to his HIV medicines his HIV disease his drinking history or his diabetes. I advised him to take a  a B. vitamin daily. He is to be on Neurontin 300 mg take 2 in the morning 2 in the early afternoon and 3 at bedtime. Appointment has been made for evaluation by the neurologist.

## 2012-02-28 ENCOUNTER — Other Ambulatory Visit: Payer: Self-pay | Admitting: Emergency Medicine

## 2012-03-02 ENCOUNTER — Other Ambulatory Visit: Payer: Self-pay

## 2012-03-12 ENCOUNTER — Ambulatory Visit: Payer: Self-pay | Admitting: Infectious Diseases

## 2012-03-25 ENCOUNTER — Ambulatory Visit: Payer: Self-pay

## 2012-03-25 ENCOUNTER — Other Ambulatory Visit (INDEPENDENT_AMBULATORY_CARE_PROVIDER_SITE_OTHER): Payer: BC Managed Care – PPO

## 2012-03-25 DIAGNOSIS — Z113 Encounter for screening for infections with a predominantly sexual mode of transmission: Secondary | ICD-10-CM

## 2012-03-25 DIAGNOSIS — B20 Human immunodeficiency virus [HIV] disease: Secondary | ICD-10-CM

## 2012-03-25 LAB — CBC WITH DIFFERENTIAL/PLATELET
Eosinophils Relative: 3 % (ref 0–5)
Lymphocytes Relative: 29 % (ref 12–46)
Lymphs Abs: 1.8 10*3/uL (ref 0.7–4.0)
MCV: 90.2 fL (ref 78.0–100.0)
Platelets: 150 10*3/uL (ref 150–400)
RBC: 4.99 MIL/uL (ref 4.22–5.81)
WBC: 6.1 10*3/uL (ref 4.0–10.5)

## 2012-03-26 LAB — COMPREHENSIVE METABOLIC PANEL
ALT: 40 U/L (ref 0–53)
Albumin: 4.5 g/dL (ref 3.5–5.2)
CO2: 26 mEq/L (ref 19–32)
Calcium: 9.2 mg/dL (ref 8.4–10.5)
Chloride: 106 mEq/L (ref 96–112)
Creat: 1.11 mg/dL (ref 0.50–1.35)
Sodium: 141 mEq/L (ref 135–145)
Total Protein: 6.8 g/dL (ref 6.0–8.3)

## 2012-03-26 LAB — HIV-1 RNA QUANT-NO REFLEX-BLD: HIV 1 RNA Quant: <20 copies/mL (ref <20)

## 2012-03-26 LAB — T-HELPER CELL (CD4) - (RCID CLINIC ONLY): CD4 T Cell Abs: 600 uL (ref 400–2700)

## 2012-03-29 ENCOUNTER — Other Ambulatory Visit: Payer: Self-pay | Admitting: Emergency Medicine

## 2012-03-29 NOTE — Telephone Encounter (Signed)
Needs office visit.

## 2012-04-09 ENCOUNTER — Ambulatory Visit: Payer: Self-pay | Admitting: Infectious Diseases

## 2012-04-21 ENCOUNTER — Telehealth: Payer: Self-pay | Admitting: *Deleted

## 2012-04-21 ENCOUNTER — Ambulatory Visit: Payer: Self-pay | Admitting: Infectious Diseases

## 2012-04-21 NOTE — Telephone Encounter (Signed)
Called patient to try and reschedule his missed appt and he advised he can not come to clinic on Fridays. Advised he has seen Dr Linus Salmons before and would be ok with seeing him. Gave him an appt to see Dr Linus Salmons 04/29/12 at 1015 am.

## 2012-04-25 ENCOUNTER — Other Ambulatory Visit: Payer: Self-pay | Admitting: Physician Assistant

## 2012-04-29 ENCOUNTER — Ambulatory Visit (INDEPENDENT_AMBULATORY_CARE_PROVIDER_SITE_OTHER): Payer: BC Managed Care – PPO | Admitting: Internal Medicine

## 2012-04-29 ENCOUNTER — Encounter: Payer: Self-pay | Admitting: Internal Medicine

## 2012-04-29 VITALS — BP 126/77 | HR 76 | Temp 97.9°F | Ht 70.0 in | Wt 242.0 lb

## 2012-04-29 DIAGNOSIS — Z8619 Personal history of other infectious and parasitic diseases: Secondary | ICD-10-CM

## 2012-04-29 DIAGNOSIS — B20 Human immunodeficiency virus [HIV] disease: Secondary | ICD-10-CM

## 2012-04-29 DIAGNOSIS — Z23 Encounter for immunization: Secondary | ICD-10-CM

## 2012-05-03 NOTE — Assessment & Plan Note (Signed)
He is surface antigen negative, with core positive.  No issues

## 2012-05-03 NOTE — Assessment & Plan Note (Signed)
He is doing very well and no changes today.  RTC in 6 months.

## 2012-05-03 NOTE — Progress Notes (Signed)
  Subjective:    Patient ID: Daniel [name], male    DOB: [birth year], 57 y.o.   MRN: [id number]  HPI He is here for follow up of HIV.  He was previously on Viramune and Truvada and changed to Atripla last visit.  Contiinues to take well with no missed doses.  Sees FM for diabetes and other issues.  CD4 remains good and undetetcable VL.     Review of Systems  Constitutional: Negative for fever, appetite change and fatigue.  HENT: Negative for sore throat and trouble swallowing.   Respiratory: Negative for cough and shortness of breath.   Cardiovascular: Negative for leg swelling.  Gastrointestinal: Negative for nausea, abdominal pain and diarrhea.  Skin: Negative for rash.  Neurological: Negative for dizziness, light-headedness and headaches.       Objective:   Physical Exam  Constitutional: He appears well-developed and well-nourished. No distress.  HENT:  Mouth/Throat: Oropharynx is clear and moist. No oropharyngeal exudate.  Cardiovascular: Normal rate, regular rhythm and normal heart sounds.  Exam reveals no gallop and no friction rub.   No murmur heard. Pulmonary/Chest: Effort normal and breath sounds normal. No respiratory distress. He has no wheezes. He has no rales.          Assessment & Plan:

## 2012-05-06 ENCOUNTER — Telehealth: Payer: Self-pay | Admitting: Internal Medicine

## 2012-05-06 NOTE — Telephone Encounter (Signed)
Received a fax from [place] [place] stating Mr. Cafaro assistance ended 09-07-11.  Called Mr. Bayus and left a v/m concerning this.  I have received another fax from [place] stating he is active until 06-08-12.  Will call Mr. Tucholski back and let him know to disregard earlier v/m

## 2012-05-25 ENCOUNTER — Other Ambulatory Visit: Payer: Self-pay | Admitting: Physician Assistant

## 2012-05-25 ENCOUNTER — Telehealth: Payer: Self-pay | Admitting: Infectious Diseases

## 2012-05-25 NOTE — Telephone Encounter (Signed)
Called and left a V/M for Daniel [name] to call me back.  We need to set up an appointment for him to come in and re-apply for his Atripla

## 2012-06-23 ENCOUNTER — Other Ambulatory Visit: Payer: Self-pay | Admitting: Physician Assistant

## 2012-06-24 ENCOUNTER — Other Ambulatory Visit: Payer: Self-pay

## 2012-06-24 MED ORDER — METFORMIN HCL 500 MG PO TABS: 500.0000 mg | ORAL_TABLET | Freq: Two times a day (BID) | ORAL | Status: DC

## 2012-06-24 MED ORDER — ATORVASTATIN CALCIUM 10 MG PO TABS: 10.0000 mg | ORAL_TABLET | Freq: Every day | ORAL | Status: DC

## 2012-07-02 ENCOUNTER — Other Ambulatory Visit: Payer: Self-pay | Admitting: Licensed Clinical Social Worker

## 2012-07-02 DIAGNOSIS — R110 Nausea: Secondary | ICD-10-CM

## 2012-07-02 DIAGNOSIS — B20 Human immunodeficiency virus [HIV] disease: Secondary | ICD-10-CM

## 2012-07-05 ENCOUNTER — Other Ambulatory Visit: Payer: Self-pay | Admitting: *Deleted

## 2012-07-05 DIAGNOSIS — R110 Nausea: Secondary | ICD-10-CM

## 2012-07-05 DIAGNOSIS — B20 Human immunodeficiency virus [HIV] disease: Secondary | ICD-10-CM

## 2012-07-05 MED ORDER — PROCHLORPERAZINE MALEATE 10 MG PO TABS: 10.0000 mg | ORAL_TABLET | Freq: Four times a day (QID) | ORAL | Status: DC | PRN

## 2012-07-05 MED ORDER — EFAVIRENZ-EMTRICITAB-TENOFOVIR 600-200-300 MG PO TABS: 1.0000 | ORAL_TABLET | Freq: Every day | ORAL | Status: DC

## 2012-07-23 ENCOUNTER — Other Ambulatory Visit: Payer: Self-pay | Admitting: Emergency Medicine

## 2012-08-04 ENCOUNTER — Other Ambulatory Visit: Payer: Self-pay | Admitting: Licensed Clinical Social Worker

## 2012-08-04 DIAGNOSIS — B20 Human immunodeficiency virus [HIV] disease: Secondary | ICD-10-CM

## 2012-08-04 MED ORDER — EFAVIRENZ-EMTRICITAB-TENOFOVIR 600-200-300 MG PO TABS: 1.0000 | ORAL_TABLET | Freq: Every day | ORAL | Status: DC

## 2012-08-26 ENCOUNTER — Ambulatory Visit (INDEPENDENT_AMBULATORY_CARE_PROVIDER_SITE_OTHER): Payer: BC Managed Care – PPO | Admitting: Family Medicine

## 2012-08-26 ENCOUNTER — Other Ambulatory Visit: Payer: Self-pay | Admitting: Physician Assistant

## 2012-08-26 VITALS — BP 124/76 | HR 102 | Temp 97.9°F | Resp 18 | Ht 71.5 in | Wt 242.0 lb

## 2012-08-26 DIAGNOSIS — E1140 Type 2 diabetes mellitus with diabetic neuropathy, unspecified: Secondary | ICD-10-CM

## 2012-08-26 DIAGNOSIS — B20 Human immunodeficiency virus [HIV] disease: Secondary | ICD-10-CM

## 2012-08-26 DIAGNOSIS — R8290 Unspecified abnormal findings in urine: Secondary | ICD-10-CM

## 2012-08-26 DIAGNOSIS — G629 Polyneuropathy, unspecified: Secondary | ICD-10-CM

## 2012-08-26 DIAGNOSIS — R300 Dysuria: Secondary | ICD-10-CM

## 2012-08-26 DIAGNOSIS — E1149 Type 2 diabetes mellitus with other diabetic neurological complication: Secondary | ICD-10-CM

## 2012-08-26 DIAGNOSIS — E119 Type 2 diabetes mellitus without complications: Secondary | ICD-10-CM

## 2012-08-26 DIAGNOSIS — B356 Tinea cruris: Secondary | ICD-10-CM

## 2012-08-26 DIAGNOSIS — E1142 Type 2 diabetes mellitus with diabetic polyneuropathy: Secondary | ICD-10-CM

## 2012-08-26 LAB — POCT CBC
Hemoglobin: 17.1 g/dL (ref 14.1–18.1)
Lymph, poc: 2.3 (ref 0.6–3.4)
MCH, POC: 33.3 pg — AB (ref 27–31.2)
MCHC: 33 g/dL (ref 31.8–35.4)
MID (cbc): 0.6 (ref 0–0.9)
MPV: 8.4 fL (ref 0–99.8)
POC Granulocyte: 5.2 (ref 2–6.9)
POC MID %: 7.9 %M (ref 0–12)
Platelet Count, POC: 177 10*3/uL (ref 142–424)
RBC: 5.14 M/uL (ref 4.69–6.13)
WBC: 8.2 10*3/uL (ref 4.6–10.2)

## 2012-08-26 LAB — POCT URINALYSIS DIPSTICK
Glucose, UA: 500
Leukocytes, UA: NEGATIVE
Nitrite, UA: NEGATIVE
Protein, UA: 100
Urobilinogen, UA: 0.2

## 2012-08-26 LAB — POCT UA - MICROSCOPIC ONLY: Crystals, Ur, HPF, POC: NEGATIVE

## 2012-08-26 LAB — POCT GLYCOSYLATED HEMOGLOBIN (HGB A1C): Hemoglobin A1C: 6.1

## 2012-08-26 MED ORDER — GABAPENTIN 300 MG PO CAPS: ORAL_CAPSULE | ORAL | Status: DC

## 2012-08-26 NOTE — Progress Notes (Signed)
Subjective Patient is here for a number of things.  His neuropathy is continued to bother him. He was sent to a neurologist after last visit but I cannot find the neurology note. I can't locate the nerve conduction studies which apparently show consistency with peripheral neuropathy.  He has a history of HIV, treated by the [facility] infectious disease physicians. He is currently stable on that  He's been having a lot of trouble with jock itch. He tried some Lamisil without cure.  He complains of his urine smelling metallic tell like, as does his stool.  Objective: Pleasant gentleman in no major distress. He was a little frustrated with the weight. His TMs are normal. Throat clear. Neck supple without nodes or thyromegaly. Chest is clear to auscultation. Heart regular without murmurs. Abdomen soft without mass or tenderness. He does have a jock itch appearing rash in his groin. This appears consistent with tenia cruris. His extremities are normal. Skin otherwise normal.  Assessment: Peripheral neuropathy Abnormal odor of urine and possible dysuria HIV Diabetes  Plan: Get neurology report Increase neuronitin      Results for orders placed in visit on 08/26/12  POCT CBC      Result Value Range   WBC 8.2  4.6 - 10.2 K/uL   Lymph, poc 2.3  0.6 - 3.4   POC LYMPH PERCENT 28.2  10 - 50 %L   MID (cbc) 0.6  0 - 0.9   POC MID % 7.9  0 - 12 %M   POC Granulocyte 5.2  2 - 6.9   Granulocyte percent 63.9  37 - 80 %G   RBC 5.14  4.69 - 6.13 M/uL   Hemoglobin 17.1  14.1 - 18.1 g/dL   HCT, POC 51.8  43.5 - 53.7 %   MCV 100.8 (*) 80 - 97 fL   MCH, POC 33.3 (*) 27 - 31.2 pg   MCHC 33.0  31.8 - 35.4 g/dL   RDW, POC 13.4     Platelet Count, POC 177  142 - 424 K/uL   MPV 8.4  0 - 99.8 fL  POCT GLYCOSYLATED HEMOGLOBIN (HGB A1C)      Result Value Range   Hemoglobin A1C 6.1    POCT UA - MICROSCOPIC ONLY      Result Value Range   WBC, Ur, HPF, POC neg     RBC, urine, microscopic  neg     Bacteria, U Microscopic 1+     Mucus, UA trace     Epithelial cells, urine per micros neg     Crystals, Ur, HPF, POC neg     Casts, Ur, LPF, POC granular     Yeast, UA neg    POCT URINALYSIS DIPSTICK      Result Value Range   Color, UA yellow     Clarity, UA cloudy     Glucose, UA 500     Bilirubin, UA neg     Ketones, UA neg     Spec Grav, UA 1.025     Blood, UA mod     pH, UA 5.5     Protein, UA 100     Urobilinogen, UA 0.2     Nitrite, UA neg     Leukocytes, UA Negative

## 2012-08-26 NOTE — Patient Instructions (Addendum)
Use Lotrimin (clotrimazole) cream twice daily in groin.    Increase neurontin to 2400 mg daily for 2-3 weeks, then on to 2700 mg if needed.  Same diabetic meds  Return if worse, otherwise recheck with Dr. Everlene Farrier in 3-4 months

## 2012-08-27 LAB — COMPREHENSIVE METABOLIC PANEL
ALT: 59 U/L — ABNORMAL HIGH (ref 0–53)
AST: 50 U/L — ABNORMAL HIGH (ref 0–37)
Alkaline Phosphatase: 218 U/L — ABNORMAL HIGH (ref 39–117)
Potassium: 4.1 mEq/L (ref 3.5–5.3)
Sodium: 136 mEq/L (ref 135–145)
Total Bilirubin: 0.9 mg/dL (ref 0.3–1.2)
Total Protein: 7.6 g/dL (ref 6.0–8.3)

## 2012-08-27 LAB — LIPID PANEL
HDL: 40 mg/dL (ref >39)
LDL Cholesterol: 113 mg/dL — ABNORMAL HIGH (ref 0–99)
Total CHOL/HDL Ratio: 5.8 Ratio
VLDL: 78 mg/dL — ABNORMAL HIGH (ref 0–40)

## 2012-08-28 ENCOUNTER — Encounter: Payer: Self-pay | Admitting: Family Medicine

## 2012-09-02 ENCOUNTER — Ambulatory Visit: Payer: Self-pay | Admitting: Nurse Practitioner

## 2012-09-21 ENCOUNTER — Other Ambulatory Visit: Payer: Self-pay | Admitting: Internal Medicine

## 2012-10-21 ENCOUNTER — Other Ambulatory Visit: Payer: BC Managed Care – PPO

## 2012-10-21 DIAGNOSIS — B20 Human immunodeficiency virus [HIV] disease: Secondary | ICD-10-CM

## 2012-10-22 LAB — HIV-1 RNA QUANT-NO REFLEX-BLD
HIV 1 RNA Quant: <20 copies/mL (ref <20)
HIV-1 RNA Quant, Log: <1.3 {Log} (ref <1.30)

## 2012-11-04 ENCOUNTER — Ambulatory Visit (INDEPENDENT_AMBULATORY_CARE_PROVIDER_SITE_OTHER): Payer: BC Managed Care – PPO | Admitting: Internal Medicine

## 2012-11-04 ENCOUNTER — Encounter: Payer: Self-pay | Admitting: Internal Medicine

## 2012-11-04 VITALS — BP 143/81 | HR 93 | Temp 98.4°F | Ht 70.5 in | Wt 244.0 lb

## 2012-11-04 DIAGNOSIS — B20 Human immunodeficiency virus [HIV] disease: Secondary | ICD-10-CM

## 2012-11-04 DIAGNOSIS — F329 Major depressive disorder, single episode, unspecified: Secondary | ICD-10-CM

## 2012-11-04 DIAGNOSIS — Z113 Encounter for screening for infections with a predominantly sexual mode of transmission: Secondary | ICD-10-CM

## 2012-11-04 DIAGNOSIS — Z23 Encounter for immunization: Secondary | ICD-10-CM

## 2012-11-04 NOTE — Progress Notes (Signed)
  Subjective:    Patient ID: Daniel [name], male    DOB: May 01, 1955, 57 y.o.   MRN: [id number]  HPI He comes in for routine followup of HIV on Atripla. He is been on this about a year and a half and denies any missed doses. He feels well and does see psychiatry as well. He does not feel he has any problems with sleeping or psychosis on the Atripla and this please with one pill a day regimen. No weight loss or new changes.   Review of Systems  Constitutional: Negative for fever, chills, fatigue and unexpected weight change.  HENT: Negative for sore throat and trouble swallowing.   Eyes: Negative for visual disturbance.  Respiratory: Negative for cough and shortness of breath.   Cardiovascular: Negative for chest pain and leg swelling.  Gastrointestinal: Negative for nausea, abdominal pain and diarrhea.  Musculoskeletal: Negative for myalgias and arthralgias.  Skin: Negative for rash.  Neurological: Negative for dizziness, light-headedness and headaches.  Hematological: Negative for adenopathy.  Psychiatric/Behavioral: Negative for dysphoric mood.       Objective:   Physical Exam  Constitutional: He is oriented to person, place, and time. He appears well-developed and well-nourished. No distress.  HENT:  Mouth/Throat: No oropharyngeal exudate.  Eyes: No scleral icterus.  Cardiovascular: Normal rate, regular rhythm and normal heart sounds.   No murmur heard. Pulmonary/Chest: Effort normal and breath sounds normal. No respiratory distress. He has no wheezes.  Lymphadenopathy:    He has no cervical adenopathy.  Neurological: He is alert and oriented to person, place, and time.  Skin: No rash noted.  Psychiatric: He has a normal mood and affect. His behavior is normal.          Assessment & Plan:

## 2012-11-04 NOTE — Assessment & Plan Note (Signed)
It does not sound like there is much of an issue with evidence on his depression. Therefore she will continue with Atripla but will consider changing therapy if in any way he becomes less stable.

## 2012-11-04 NOTE — Assessment & Plan Note (Signed)
He is doing well and will return in 6 months.

## 2013-01-10 ENCOUNTER — Ambulatory Visit: Payer: BC Managed Care – PPO | Admitting: Emergency Medicine

## 2013-02-24 ENCOUNTER — Other Ambulatory Visit: Payer: Self-pay

## 2013-02-24 MED ORDER — ATORVASTATIN CALCIUM 10 MG PO TABS: ORAL_TABLET | ORAL | Status: DC

## 2013-02-24 MED ORDER — METFORMIN HCL 500 MG PO TABS: ORAL_TABLET | ORAL | Status: DC

## 2013-02-24 MED ORDER — GLIPIZIDE ER 10 MG PO TB24: ORAL_TABLET | ORAL | Status: DC

## 2013-02-24 MED ORDER — LOSARTAN POTASSIUM 100 MG PO TABS
ORAL_TABLET | ORAL | Status: DC
Start: 2013-02-24 — End: 2013-09-06

## 2013-03-17 ENCOUNTER — Ambulatory Visit (INDEPENDENT_AMBULATORY_CARE_PROVIDER_SITE_OTHER): Payer: BC Managed Care – PPO | Admitting: Emergency Medicine

## 2013-03-17 ENCOUNTER — Ambulatory Visit: Payer: BC Managed Care – PPO

## 2013-03-17 VITALS — BP 130/72 | HR 95 | Temp 98.7°F | Resp 16 | Ht 71.5 in | Wt 244.4 lb

## 2013-03-17 DIAGNOSIS — R109 Unspecified abdominal pain: Secondary | ICD-10-CM

## 2013-03-17 DIAGNOSIS — M25569 Pain in unspecified knee: Secondary | ICD-10-CM

## 2013-03-17 DIAGNOSIS — M25562 Pain in left knee: Principal | ICD-10-CM

## 2013-03-17 DIAGNOSIS — E119 Type 2 diabetes mellitus without complications: Secondary | ICD-10-CM

## 2013-03-17 DIAGNOSIS — R945 Abnormal results of liver function studies: Secondary | ICD-10-CM

## 2013-03-17 DIAGNOSIS — Z1211 Encounter for screening for malignant neoplasm of colon: Secondary | ICD-10-CM

## 2013-03-17 DIAGNOSIS — M25561 Pain in right knee: Secondary | ICD-10-CM

## 2013-03-17 DIAGNOSIS — R7989 Other specified abnormal findings of blood chemistry: Secondary | ICD-10-CM

## 2013-03-17 LAB — POCT CBC
GRANULOCYTE PERCENT: 65.1 % (ref 37–80)
HEMATOCRIT: 50.3 % (ref 43.5–53.7)
HEMOGLOBIN: 16.3 g/dL (ref 14.1–18.1)
LYMPH, POC: 1.9 (ref 0.6–3.4)
MCH, POC: 32.7 pg — AB (ref 27–31.2)
MCHC: 32.4 g/dL (ref 31.8–35.4)
MCV: 101.1 fL — AB (ref 80–97)
MID (cbc): 0.5 (ref 0–0.9)
MPV: 8.3 fL (ref 0–99.8)
POC GRANULOCYTE: 4.4 (ref 2–6.9)
POC LYMPH PERCENT: 27.9 %L (ref 10–50)
POC MID %: 7 %M (ref 0–12)
Platelet Count, POC: 151 10*3/uL (ref 142–424)
RBC: 4.98 M/uL (ref 4.69–6.13)
RDW, POC: 13.6 %
WBC: 6.8 10*3/uL (ref 4.6–10.2)

## 2013-03-17 LAB — POCT GLYCOSYLATED HEMOGLOBIN (HGB A1C): HEMOGLOBIN A1C: 7.1

## 2013-03-17 LAB — COMPLETE METABOLIC PANEL WITH GFR
ALBUMIN: 4.2 g/dL (ref 3.5–5.2)
ALK PHOS: 166 U/L — AB (ref 39–117)
ALT: 54 U/L — ABNORMAL HIGH (ref 0–53)
AST: 56 U/L — AB (ref 0–37)
BILIRUBIN TOTAL: 0.7 mg/dL (ref 0.3–1.2)
BUN: 15 mg/dL (ref 6–23)
CO2: 25 mEq/L (ref 19–32)
Calcium: 10 mg/dL (ref 8.4–10.5)
Chloride: 103 mEq/L (ref 96–112)
Creat: 1.2 mg/dL (ref 0.50–1.35)
GFR, Est African American: 76 mL/min
GFR, Est Non African American: 66 mL/min
Glucose, Bld: 185 mg/dL — ABNORMAL HIGH (ref 70–99)
Potassium: 4.3 mEq/L (ref 3.5–5.3)
SODIUM: 138 meq/L (ref 135–145)
Total Protein: 6.9 g/dL (ref 6.0–8.3)

## 2013-03-17 LAB — IFOBT (OCCULT BLOOD): IFOBT: NEGATIVE

## 2013-03-17 LAB — GLUCOSE, POCT (MANUAL RESULT ENTRY): POC GLUCOSE: 173 mg/dL — AB (ref 70–99)

## 2013-03-17 LAB — PSA: PSA: 0.59 ng/mL (ref <=4.00)

## 2013-03-17 LAB — HEPATITIS C ANTIBODY: HCV Ab: NEGATIVE

## 2013-03-17 NOTE — Progress Notes (Addendum)
Subjective:    Patient ID: Daniel [name], male    DOB: 1955/07/09, 58 y.o.   MRN: [id number]  HPI This chart was scribed for Remo Lipps Sequoia Witz-MD, by Lovena Le Day, Scribe. This patient was seen in room 9 and the patient's care was started at 10:39 AM.  HPI Comments: Daniel [name] is a 58 y.o. male who presents to the [facility] complaining of bilateral knee pain.  He is HIV positive and also diabetic. He reports that his peripheral neuropathy has worsened lately and increased his dosage of gabapentin to manage it. He also reports new recent onset of bilateral knee pain, no acute injuries. He states his knees feel like they have arthritis. Arthritis cream w/minimal relief. Goes to eye doctor once a year.  Patient Active Problem List   Diagnosis Date Noted  . Hepatic steatosis 09/09/2010  . UTI 09/15/2006  . PYURIA 09/03/2006  . HIV DISEASE 06/04/2006  . HERPES ZOSTER, UNCOMPLICATED 62/22/9798  . HSV 06/04/2006  . DEPRESSION 06/04/2006  . DISORDER, ATTENTION DEFICIT W/HYPERACTIVITY 06/04/2006  . THROMBOPHLEBITIS NOS 06/04/2006  . GERD 06/04/2006  . ARTHRITIS, HAND 06/04/2006  . HYPERGLYCEMIA, HX OF 06/04/2006  . HEPATITIS B, HX OF 06/04/2006   Past Surgical History  Procedure Laterality Date  . Small intestine surgery     Family History  Problem Relation Age of Onset  . Cancer Brother   . COPD Mother    History   Social History  . Marital Status: Single    Spouse Name: N/A    Number of Children: N/A  . Years of Education: N/A   Occupational History  . Not on file.   Social History Main Topics  . Smoking status: Current Some Day Smoker -- 0.30 packs/day for 10 years    Types: Cigars  . Smokeless tobacco: Never Used  . Alcohol Use: No  . Drug Use: No  . Sexual Activity: Not Currently    Partners: Male     Comment: pt. declined condoms   Other Topics Concern  . Not on file   Social History Narrative  . No narrative on file   No Known  Allergies  Results for orders placed in visit on 10/21/12  HIV 1 RNA QUANT-NO REFLEX-BLD      Result Value Range   HIV 1 RNA Quant <20  <20 copies/mL   HIV1 RNA Quant, Log <1.30  <1.30 log 10  T-HELPER CELL (CD4)      Result Value Range   CD4 T Cell Abs 760  400 - 2700 /uL   CD4 % Helper T Cell 37  33 - 55 %   Review of Systems  Constitutional: Negative for fever and chills.  Respiratory: Negative for cough and shortness of breath.   Cardiovascular: Negative for chest pain.  Gastrointestinal: Negative for abdominal pain.  Musculoskeletal: Negative for back pain.       Bilateral knee pain      Objective:   Physical Exam Physical Exam  Nursing note and vitals reviewed. Constitutional: Patient is oriented to person, place, and time. Patient appears well-developed and well-nourished. No distress.  HENT:  Head: Normocephalic and atraumatic.  Neck: Neck supple. No tracheal deviation present.  Cardiovascular: Normal rate, regular rhythm and normal heart sounds.   No murmur heard. Pulmonary/Chest: Effort normal and breath sounds normal. No respiratory distress. Patient has no wheezes. Patient has no rales.  Musculoskeletal: Normal range of motion.  Neurological: Patient is alert and oriented to  person, place, and time.  Skin: Skin is warm and dry.  Psychiatric: Patient has a normal mood and affect. Patient's behavior is normal.   Results for orders placed in visit on 03/17/13  POCT CBC      Result Value Range   WBC 6.8  4.6 - 10.2 K/uL   Lymph, poc 1.9  0.6 - 3.4   POC LYMPH PERCENT 27.9  10 - 50 %L   MID (cbc) 0.5  0 - 0.9   POC MID % 7.0  0 - 12 %M   POC Granulocyte 4.4  2 - 6.9   Granulocyte percent 65.1  37 - 80 %G   RBC 4.98  4.69 - 6.13 M/uL   Hemoglobin 16.3  14.1 - 18.1 g/dL   HCT, POC 50.3  43.5 - 53.7 %   MCV 101.1 (*) 80 - 97 fL   MCH, POC 32.7 (*) 27 - 31.2 pg   MCHC 32.4  31.8 - 35.4 g/dL   RDW, POC 13.6     Platelet Count, POC 151  142 - 424 K/uL   MPV 8.3   0 - 99.8 fL  GLUCOSE, POCT (MANUAL RESULT ENTRY)      Result Value Range   POC Glucose 173 (*) 70 - 99 mg/dl  POCT GLYCOSYLATED HEMOGLOBIN (HGB A1C)      Result Value Range   Hemoglobin A1C 7.1    IFOBT (OCCULT BLOOD)      Result Value Range   IFOBT Negative    [facility] reading (PRIMARY) by  Dr. Everlene Farrier there is narrowing of the medial joint space of both knees Triage Vitals: BP 130/72  Pulse 95  Temp(Src) 98.7 F (37.1 C) (Oral)  Resp 16  Ht 5' 11.5" (1.816 m)  Wt 244 lb 6.4 oz (110.859 kg)  BMI 33.62 kg/m2  SpO2 100%  DIAGNOSTIC STUDIES: Oxygen Saturation is 100% on room air, normal by my interpretation.    COORDINATION OF CARE: At 1025 AM Discussed treatment plan with patient which includes bilateral knee X-rays. Patient agrees.      Assessment & Plan:  His hemoglobin A1c is going up one point. He has to lose weight to get better sugar control. Appointment will be made for him to have a screening colonoscopy. Advised him to see me next or for physical I personally performed the services described in this documentation, which was scribed in my presence. The recorded information has been reviewed and is accurate.

## 2013-03-25 ENCOUNTER — Telehealth: Payer: Self-pay

## 2013-03-25 MED ORDER — PROCHLORPERAZINE MALEATE 10 MG PO TABS: ORAL_TABLET | ORAL | Status: DC

## 2013-03-25 NOTE — Telephone Encounter (Signed)
Refill

## 2013-03-31 ENCOUNTER — Telehealth: Payer: Self-pay

## 2013-03-31 ENCOUNTER — Telehealth: Payer: Self-pay | Admitting: Radiology

## 2013-03-31 MED ORDER — GLIPIZIDE ER 10 MG PO TB24: 10.0000 mg | ORAL_TABLET | Freq: Every day | ORAL | Status: DC

## 2013-03-31 MED ORDER — METFORMIN HCL 500 MG PO TABS: 500.0000 mg | ORAL_TABLET | Freq: Two times a day (BID) | ORAL | Status: DC

## 2013-03-31 MED ORDER — ATORVASTATIN CALCIUM 10 MG PO TABS: 10.0000 mg | ORAL_TABLET | Freq: Every day | ORAL | Status: DC

## 2013-03-31 NOTE — Telephone Encounter (Signed)
Spoke with patient, sent in medications. patient understands

## 2013-03-31 NOTE — Telephone Encounter (Signed)
Patient called from pharmacy. Upset that his medications were "denied".  I do not see documentation of this.  Patient was recently seen by Dr Everlene Farrier.  He needs refills on: Lipitor Metformin Glipizide Patient waiting at Pharmacy before it closes.

## 2013-03-31 NOTE — Telephone Encounter (Signed)
We did not deny but I have sent to the pharmacy.

## 2013-03-31 NOTE — Telephone Encounter (Signed)
Patient called regarding his rx refills that were denied. He states the pharmacy faxed the refill info to [name] and is not sure why they were denied. Please return his call. Thank you.

## 2013-04-01 NOTE — Telephone Encounter (Signed)
Duplicate message. 

## 2013-04-11 ENCOUNTER — Telehealth: Payer: Self-pay

## 2013-04-11 NOTE — Telephone Encounter (Signed)
Patient states that he needs a renewal on a CPAP machine. [place] [facility] fax number [phone]  [phone]

## 2013-04-12 MED ORDER — UNABLE TO FIND: Status: DC

## 2013-04-12 NOTE — Telephone Encounter (Signed)
Printed/signed and faxed to [facility]. Notified pt.

## 2013-04-12 NOTE — Telephone Encounter (Signed)
Dr Everlene Farrier, I checked pt's paper chart since no notes in [facility] that I could find. I have put chart by your computer w/page marked for reference and pended order in [facility]. Please add further directions for settings, directions.

## 2013-04-22 ENCOUNTER — Other Ambulatory Visit: Payer: Self-pay | Admitting: Physician Assistant

## 2013-06-21 ENCOUNTER — Ambulatory Visit (INDEPENDENT_AMBULATORY_CARE_PROVIDER_SITE_OTHER): Payer: BC Managed Care – PPO | Admitting: Emergency Medicine

## 2013-06-21 ENCOUNTER — Encounter: Payer: Self-pay | Admitting: Emergency Medicine

## 2013-06-21 VITALS — BP 126/76 | HR 89 | Temp 98.1°F | Resp 16 | Ht 71.0 in | Wt 241.2 lb

## 2013-06-21 DIAGNOSIS — M25561 Pain in right knee: Secondary | ICD-10-CM

## 2013-06-21 DIAGNOSIS — M25562 Pain in left knee: Secondary | ICD-10-CM

## 2013-06-21 DIAGNOSIS — M25569 Pain in unspecified knee: Secondary | ICD-10-CM

## 2013-06-21 DIAGNOSIS — R319 Hematuria, unspecified: Secondary | ICD-10-CM | POA: Insufficient documentation

## 2013-06-21 DIAGNOSIS — Z1211 Encounter for screening for malignant neoplasm of colon: Secondary | ICD-10-CM

## 2013-06-21 DIAGNOSIS — Z0000 Encounter for general adult medical examination without abnormal findings: Secondary | ICD-10-CM

## 2013-06-21 LAB — POCT URINALYSIS DIPSTICK
Bilirubin, UA: NEGATIVE
GLUCOSE UA: 500
KETONES UA: NEGATIVE
Leukocytes, UA: NEGATIVE
Nitrite, UA: NEGATIVE
Protein, UA: 100
SPEC GRAV UA: 1.025
UROBILINOGEN UA: 0.2
pH, UA: 6

## 2013-06-21 LAB — COMPLETE METABOLIC PANEL WITH GFR
ALK PHOS: 166 U/L — AB (ref 39–117)
ALT: 50 U/L (ref 0–53)
AST: 48 U/L — ABNORMAL HIGH (ref 0–37)
Albumin: 4.4 g/dL (ref 3.5–5.2)
BILIRUBIN TOTAL: 0.8 mg/dL (ref 0.2–1.2)
BUN: 17 mg/dL (ref 6–23)
CO2: 28 mEq/L (ref 19–32)
Calcium: 9.4 mg/dL (ref 8.4–10.5)
Chloride: 101 mEq/L (ref 96–112)
Creat: 1.09 mg/dL (ref 0.50–1.35)
GFR, EST AFRICAN AMERICAN: 85 mL/min
GFR, EST NON AFRICAN AMERICAN: 74 mL/min
GLUCOSE: 117 mg/dL — AB (ref 70–99)
Potassium: 4.5 mEq/L (ref 3.5–5.3)
SODIUM: 138 meq/L (ref 135–145)
Total Protein: 7.1 g/dL (ref 6.0–8.3)

## 2013-06-21 LAB — CBC
HCT: 44.3 % (ref 39.0–52.0)
Hemoglobin: 16.1 g/dL (ref 13.0–17.0)
MCH: 32.1 pg (ref 26.0–34.0)
MCHC: 36.3 g/dL — ABNORMAL HIGH (ref 30.0–36.0)
MCV: 88.4 fL (ref 78.0–100.0)
PLATELETS: 160 10*3/uL (ref 150–400)
RBC: 5.01 MIL/uL (ref 4.22–5.81)
RDW: 14 % (ref 11.5–15.5)
WBC: 7.5 10*3/uL (ref 4.0–10.5)

## 2013-06-21 LAB — T4, FREE: Free T4: 0.92 ng/dL (ref 0.80–1.80)

## 2013-06-21 LAB — POCT UA - MICROSCOPIC ONLY
Bacteria, U Microscopic: NEGATIVE
CASTS, UR, LPF, POC: NEGATIVE
CRYSTALS, UR, HPF, POC: NEGATIVE
Epithelial cells, urine per micros: NEGATIVE
Yeast, UA: NEGATIVE

## 2013-06-21 LAB — LIPID PANEL
Cholesterol: 192 mg/dL (ref 0–200)
HDL: 45 mg/dL (ref >39)
LDL Cholesterol: 87 mg/dL (ref 0–99)
Total CHOL/HDL Ratio: 4.3 Ratio
Triglycerides: 298 mg/dL — ABNORMAL HIGH (ref <150)
VLDL: 60 mg/dL — ABNORMAL HIGH (ref 0–40)

## 2013-06-21 LAB — POCT GLYCOSYLATED HEMOGLOBIN (HGB A1C): Hemoglobin A1C: 6.8

## 2013-06-21 LAB — TSH: TSH: 1.067 u[IU]/mL (ref 0.350–4.500)

## 2013-06-21 MED ORDER — DICLOFENAC SODIUM 1 % TD GEL: 2.0000 g | Freq: Two times a day (BID) | TRANSDERMAL | Status: DC

## 2013-06-21 NOTE — Progress Notes (Addendum)
This chart was scribed for Daniel Russian, MD by [name], Scribe. This patient was seen in room 22 and the patient's care was started at 3:39 PM.  Subjective:    Patient ID: Daniel [name], male    DOB: [birth year], 58 y.o.   MRN: [id number]  Chief Complaint  Patient presents with   Annual Exam   Knee Pain    both knees swelling and painful    HPI  HPI Comments: Daniel [name] is a 58 y.o. male with a history of HIV who presents to [facility] for a complete physical examination. He states that he believes his blood sugar is out of control. He states that he is neither dieting nor exercising to help with this. He reports tat he has been seeing [name] with Infectious Diseases every 6 months. He states that he smokes 1 cigar a day. He admits that he was a heavy drinker in the past, but that he has not been a heavy drinker for the past 10 years. He notes that he was also told to have his thyroid checked today.   He states that he is not up to date on colonoscopy. He mentions that he cancelled his last appointment to have a colonoscopy. He states that he refuses to have chemotherapy and radiation if anything significant is found, although he is agreeable to having colon polyps removed if they are found. He mentions that he would like to schedule this himself.  He states that he has been having intermittent, severe blateral foot and knee pain. He is on 3000 mg of Neurontin a day. He has been told that he has carpal tunnel syndrome in his left hand.     Patient Active Problem List   Diagnosis Date Noted   Hepatic steatosis 09/09/2010   UTI 09/15/2006   PYURIA 09/03/2006   HIV DISEASE 06/04/2006   HERPES ZOSTER, UNCOMPLICATED 26/94/8546   HSV 06/04/2006   DEPRESSION 06/04/2006   DISORDER, ATTENTION DEFICIT W/HYPERACTIVITY 06/04/2006   THROMBOPHLEBITIS NOS 06/04/2006   GERD 06/04/2006   ARTHRITIS, HAND 06/04/2006   HYPERGLYCEMIA, HX OF 06/04/2006     HEPATITIS B, HX OF 06/04/2006    Review of Systems  Constitutional: Positive for fatigue. Negative for fever and chills.  HENT: Positive for dental problem. Negative for congestion, rhinorrhea and sore throat.   Respiratory: Negative for shortness of breath.   Cardiovascular: Negative for chest pain.  Gastrointestinal: Negative for nausea, vomiting, abdominal pain and diarrhea.  Musculoskeletal: Positive for back pain, joint swelling and myalgias.       Bilateral foot and knee pain  Skin: Negative for rash.  Allergic/Immunologic: Positive for environmental allergies.  Neurological: Positive for numbness. Negative for dizziness, syncope and headaches.  Psychiatric/Behavioral: Positive for dysphoric mood, decreased concentration and agitation.       Objective:   Physical Exam  CONSTITUTIONAL: Well developed/well nourished HEAD: Normocephalic/atraumatic EYES: EOMI/PERRL ENMT: Mucous membranes moist; Face is flushed NECK: supple no meningeal signs SPINE:entire spine nontender CV: S1/S2 noted, no murmurs/rubs/gallops noted LUNGS: Lungs are clear to auscultation bilaterally, no apparent distress ABDOMEN: soft, nontender, no rebound or guarding; abdomen is protuberant without masses or scars, well-healed in the abdomen GU:no cva tenderness NEURO: Pt is awake/alert, moves all extremitiesx4; Decreased sensation from the knees down on both legs EXTREMITIES: pulses normal, full ROM; arthritic changes of both knees SKIN: warm, color normal PSYCH: no abnormalities of mood noted EKG NSR Results for orders placed in visit  on 06/21/13  POCT URINALYSIS DIPSTICK      Result Value Ref Range   Color, UA yellow     Clarity, UA clear     Glucose, UA 500     Bilirubin, UA neg     Ketones, UA neg     Spec Grav, UA 1.025     Blood, UA small     pH, UA 6.0     Protein, UA 100     Urobilinogen, UA 0.2     Nitrite, UA neg     Leukocytes, UA Negative    POCT UA - MICROSCOPIC ONLY       Result Value Ref Range   WBC, Ur, HPF, POC 0-1     RBC, urine, microscopic 2-6     Bacteria, U Microscopic neg     Mucus, UA trace     Epithelial cells, urine per micros neg     Crystals, Ur, HPF, POC neg     Casts, Ur, LPF, POC neg     Yeast, UA neg    POCT GLYCOSYLATED HEMOGLOBIN (HGB A1C)      Result Value Ref Range   Hemoglobin A1C 6.8       BP 126/76   Pulse 89   Temp(Src) 98.1 F (36.7 C) (Oral)   Resp 16   Ht 5' 11"  (1.803 m)   Wt 241 lb 3.2 oz (109.408 kg)   BMI 33.66 kg/m2   SpO2 96% Assessment & Plan:   1. Routine general medical examination at a health care facility   2 HIV 3 Obesity 4 Diabetes 5Hematuria Referral made to neurology because of the hematuria in a smoker . He does have proteinuria also has HIV disease which may be the source of this abnormality. He canceled his colonoscopy last appointment and reschedule that. He sees Dr. Toy Care regularly for his mood disorder. He did seem a little more agitated today than usual. I personally performed the services described in this documentation, which was scribed in my presence. The recorded information has been reviewed and is accurate.

## 2013-06-22 LAB — MICROALBUMIN, URINE: Microalb, Ur: 18.18 mg/dL — ABNORMAL HIGH (ref 0.00–1.89)

## 2013-07-01 ENCOUNTER — Telehealth: Payer: Self-pay

## 2013-07-01 NOTE — Telephone Encounter (Signed)
PA completed on covermymeds for voltaren gel. Waiting dec.

## 2013-07-05 NOTE — Telephone Encounter (Signed)
PA approved through 02/23/2038. Notified pharm.

## 2013-07-06 ENCOUNTER — Other Ambulatory Visit: Payer: BC Managed Care – PPO

## 2013-07-06 DIAGNOSIS — B20 Human immunodeficiency virus [HIV] disease: Secondary | ICD-10-CM

## 2013-07-06 DIAGNOSIS — Z113 Encounter for screening for infections with a predominantly sexual mode of transmission: Secondary | ICD-10-CM

## 2013-07-06 LAB — CBC WITH DIFFERENTIAL/PLATELET
Basophils Absolute: 0 10*3/uL (ref 0.0–0.1)
Basophils Relative: 0 % (ref 0–1)
Eosinophils Absolute: 0.2 10*3/uL (ref 0.0–0.7)
Eosinophils Relative: 3 % (ref 0–5)
HCT: 44.5 % (ref 39.0–52.0)
Hemoglobin: 15.8 g/dL (ref 13.0–17.0)
Lymphocytes Relative: 29 % (ref 12–46)
Lymphs Abs: 1.9 10*3/uL (ref 0.7–4.0)
MCH: 32.1 pg (ref 26.0–34.0)
MCHC: 35.5 g/dL (ref 30.0–36.0)
MCV: 90.4 fL (ref 78.0–100.0)
Monocytes Absolute: 0.6 10*3/uL (ref 0.1–1.0)
Monocytes Relative: 9 % (ref 3–12)
Neutro Abs: 4 10*3/uL (ref 1.7–7.7)
Neutrophils Relative %: 59 % (ref 43–77)
Platelets: 159 10*3/uL (ref 150–400)
RBC: 4.92 MIL/uL (ref 4.22–5.81)
RDW: 13.9 % (ref 11.5–15.5)
WBC: 6.7 10*3/uL (ref 4.0–10.5)

## 2013-07-07 LAB — COMPLETE METABOLIC PANEL WITH GFR
ALK PHOS: 168 U/L — AB (ref 39–117)
ALT: 41 U/L (ref 0–53)
AST: 29 U/L (ref 0–37)
Albumin: 4.2 g/dL (ref 3.5–5.2)
BUN: 18 mg/dL (ref 6–23)
CALCIUM: 9.1 mg/dL (ref 8.4–10.5)
CHLORIDE: 99 meq/L (ref 96–112)
CO2: 27 mEq/L (ref 19–32)
Creat: 1.16 mg/dL (ref 0.50–1.35)
GFR, Est African American: 79 mL/min
GFR, Est Non African American: 69 mL/min
Glucose, Bld: 245 mg/dL — ABNORMAL HIGH (ref 70–99)
POTASSIUM: 4.1 meq/L (ref 3.5–5.3)
SODIUM: 135 meq/L (ref 135–145)
TOTAL PROTEIN: 7.3 g/dL (ref 6.0–8.3)
Total Bilirubin: 0.6 mg/dL (ref 0.2–1.2)

## 2013-07-07 LAB — HIV-1 RNA QUANT-NO REFLEX-BLD: HIV-1 RNA Quant, Log: <1.3 {Log} (ref <1.30)

## 2013-07-07 LAB — RPR

## 2013-07-08 ENCOUNTER — Other Ambulatory Visit: Payer: Self-pay | Admitting: Physician Assistant

## 2013-07-08 LAB — T-HELPER CELL (CD4) - (RCID CLINIC ONLY)
CD4 % Helper T Cell: 40 % (ref 33–55)
CD4 T CELL ABS: 820 /uL (ref 400–2700)

## 2013-07-27 ENCOUNTER — Encounter: Payer: Self-pay | Admitting: Internal Medicine

## 2013-07-27 ENCOUNTER — Ambulatory Visit (INDEPENDENT_AMBULATORY_CARE_PROVIDER_SITE_OTHER): Payer: BC Managed Care – PPO | Admitting: Internal Medicine

## 2013-07-27 ENCOUNTER — Telehealth: Payer: Self-pay | Admitting: Emergency Medicine

## 2013-07-27 VITALS — BP 121/76 | HR 92 | Temp 98.2°F | Ht 70.0 in | Wt 243.0 lb

## 2013-07-27 DIAGNOSIS — B20 Human immunodeficiency virus [HIV] disease: Secondary | ICD-10-CM

## 2013-07-27 DIAGNOSIS — R7989 Other specified abnormal findings of blood chemistry: Secondary | ICD-10-CM

## 2013-07-27 LAB — GLUCOSE, CAPILLARY: Glucose-Capillary: 439 mg/dL — ABNORMAL HIGH (ref 70–99)

## 2013-07-27 MED ORDER — ELVITEG-COBIC-EMTRICIT-TENOFDF 150-150-200-300 MG PO TABS: 1.0000 | ORAL_TABLET | Freq: Every day | ORAL | Status: DC

## 2013-07-27 NOTE — Progress Notes (Signed)
  Subjective:    Patient ID: Daniel [name], male    DOB: [birth year], 58 y.o.   MRN: [id number]  HPI  He comes in for routine followup of HIV on Atripla. He is been on this since 2013 and denies any missed doses. He feels well and does see psychiatry as well. No weight loss or new changes.  Does feel like his blood  Sugar is up. Some sleep difficulty he equates to urinating at night.     Review of Systems  Constitutional: Negative for fever, chills, fatigue and unexpected weight change.  HENT: Negative for sore throat and trouble swallowing.   Eyes: Negative for visual disturbance.  Respiratory: Negative for cough and shortness of breath.   Cardiovascular: Negative for chest pain and leg swelling.  Gastrointestinal: Negative for nausea, abdominal pain and diarrhea.  Musculoskeletal: Negative for arthralgias and myalgias.  Skin: Negative for rash.  Neurological: Negative for dizziness, light-headedness and headaches.  Hematological: Negative for adenopathy.  Psychiatric/Behavioral: Negative for dysphoric mood.       Objective:   Physical Exam  Constitutional: He is oriented to person, place, and time. He appears well-developed and well-nourished. No distress.  HENT:  Mouth/Throat: No oropharyngeal exudate.  Eyes: No scleral icterus.  Cardiovascular: Normal rate, regular rhythm and normal heart sounds.   No murmur heard. Pulmonary/Chest: Effort normal and breath sounds normal. No respiratory distress. He has no wheezes.  Lymphadenopathy:    He has no cervical adenopathy.  Neurological: He is alert and oriented to person, place, and time.  Skin: No rash noted.  Psychiatric: He has a normal mood and affect. His behavior is normal.          Assessment & Plan:

## 2013-07-27 NOTE — Telephone Encounter (Signed)
Pt will be here Fri afternoon.

## 2013-07-27 NOTE — Telephone Encounter (Signed)
Call to come see me Friday or next week regarding his Diabetes

## 2013-07-27 NOTE — Assessment & Plan Note (Signed)
I discussed options of changing Atripla to [place] due to sleep difficulty and hyperlipidemia.  He will change with his next refill.  Labs in about 6 weeks and f/u with me in 8 weeks.

## 2013-07-27 NOTE — Assessment & Plan Note (Signed)
Sugar over 400.  I recommended he followup with PCP this week.

## 2013-08-15 ENCOUNTER — Telehealth: Payer: Self-pay | Admitting: *Deleted

## 2013-08-15 NOTE — Telephone Encounter (Signed)
Received request to refill Atripla.  Patient was switched from Atripla to [place] at last visit.  Prescription sent electronically to [facility].  RN confirmed this with [facility]. Patient still needs to contact them to set up delivery.  Patient needs to call [phone]. Landis Gandy, RN

## 2013-09-05 ENCOUNTER — Other Ambulatory Visit: Payer: Self-pay | Admitting: Family Medicine

## 2013-09-06 ENCOUNTER — Telehealth: Payer: Self-pay | Admitting: *Deleted

## 2013-09-06 MED ORDER — LOSARTAN POTASSIUM 100 MG PO TABS: ORAL_TABLET | ORAL | Status: DC

## 2013-09-06 NOTE — Telephone Encounter (Signed)
Spoke patient he will be in the clinic 7/26 to follow up with diabetes. He will also need further refills on his Losartan at this time.

## 2013-09-06 NOTE — Telephone Encounter (Signed)
Per Chart notes- called pt and LM pt needs to come in for a follow up on the recent labs- Glucose was over 400. Sent refill for Losartan 149m tablet to pharmacy- only 30 day supply due to needing this follow up.

## 2013-09-08 ENCOUNTER — Telehealth: Payer: Self-pay

## 2013-09-08 MED ORDER — BLOOD GLUCOSE METER KIT: PACK | Status: DC

## 2013-09-08 NOTE — Telephone Encounter (Signed)
PT IS REQUESTING DIABETIC METER AND SUPPLIES. PT REQUESTS THAT WE DO NOT ATTACH A BRAND NAME TO THIS SO THAT THERE IS A GREATER CHANCE THAT HIS INSURANCE WILL PAY FOR IT.

## 2013-09-08 NOTE — Telephone Encounter (Signed)
Pt has never been given a meter. Sent in a meter with 100 lancets/strips so that he can monitor his sugars and have an idea of what they are doing before he sees Dr. Everlene Farrier on 7/26

## 2013-09-18 ENCOUNTER — Telehealth: Payer: Self-pay

## 2013-09-18 NOTE — Telephone Encounter (Signed)
Error

## 2013-09-27 ENCOUNTER — Other Ambulatory Visit: Payer: Self-pay

## 2013-09-27 MED ORDER — ATORVASTATIN CALCIUM 10 MG PO TABS: ORAL_TABLET | ORAL | Status: DC

## 2013-09-27 MED ORDER — GABAPENTIN 300 MG PO CAPS: ORAL_CAPSULE | ORAL | Status: DC

## 2013-09-27 MED ORDER — LOSARTAN POTASSIUM 100 MG PO TABS: ORAL_TABLET | ORAL | Status: DC

## 2013-09-27 NOTE — Telephone Encounter (Signed)
Pharm reqs 90 day RFs of gabapentin, losartan and atorvastatin. He is overdue for f/up, but has appt sch for 10/18/13. Do you want to ok the 90 day RFs?

## 2013-10-07 ENCOUNTER — Other Ambulatory Visit: Payer: Self-pay | Admitting: Emergency Medicine

## 2013-10-18 ENCOUNTER — Ambulatory Visit: Payer: BC Managed Care – PPO | Admitting: Emergency Medicine

## 2013-11-07 ENCOUNTER — Ambulatory Visit (INDEPENDENT_AMBULATORY_CARE_PROVIDER_SITE_OTHER): Payer: BC Managed Care – PPO | Admitting: Emergency Medicine

## 2013-11-07 ENCOUNTER — Other Ambulatory Visit: Payer: Self-pay | Admitting: Emergency Medicine

## 2013-11-07 VITALS — BP 128/68 | HR 95 | Temp 98.1°F | Resp 16 | Wt 226.6 lb

## 2013-11-07 DIAGNOSIS — F329 Major depressive disorder, single episode, unspecified: Secondary | ICD-10-CM

## 2013-11-07 DIAGNOSIS — F32A Depression, unspecified: Secondary | ICD-10-CM

## 2013-11-07 DIAGNOSIS — Z794 Long term (current) use of insulin: Secondary | ICD-10-CM

## 2013-11-07 DIAGNOSIS — Z1211 Encounter for screening for malignant neoplasm of colon: Secondary | ICD-10-CM

## 2013-11-07 DIAGNOSIS — F3289 Other specified depressive episodes: Secondary | ICD-10-CM

## 2013-11-07 DIAGNOSIS — B20 Human immunodeficiency virus [HIV] disease: Secondary | ICD-10-CM

## 2013-11-07 DIAGNOSIS — E119 Type 2 diabetes mellitus without complications: Secondary | ICD-10-CM | POA: Insufficient documentation

## 2013-11-07 DIAGNOSIS — E0849 Diabetes mellitus due to underlying condition with other diabetic neurological complication: Secondary | ICD-10-CM

## 2013-11-07 DIAGNOSIS — E1349 Other specified diabetes mellitus with other diabetic neurological complication: Secondary | ICD-10-CM

## 2013-11-07 DIAGNOSIS — G988 Other disorders of nervous system: Secondary | ICD-10-CM

## 2013-11-07 DIAGNOSIS — E785 Hyperlipidemia, unspecified: Secondary | ICD-10-CM

## 2013-11-07 LAB — COMPLETE METABOLIC PANEL WITH GFR
ALT: 34 U/L (ref 0–53)
AST: 25 U/L (ref 0–37)
Albumin: 4.6 g/dL (ref 3.5–5.2)
Alkaline Phosphatase: 134 U/L — ABNORMAL HIGH (ref 39–117)
BILIRUBIN TOTAL: 1.1 mg/dL (ref 0.2–1.2)
BUN: 19 mg/dL (ref 6–23)
CO2: 27 mEq/L (ref 19–32)
CREATININE: 1.74 mg/dL — AB (ref 0.50–1.35)
Calcium: 9.8 mg/dL (ref 8.4–10.5)
Chloride: 103 mEq/L (ref 96–112)
GFR, EST AFRICAN AMERICAN: 48 mL/min — AB
GFR, EST NON AFRICAN AMERICAN: 42 mL/min — AB
GLUCOSE: 120 mg/dL — AB (ref 70–99)
Potassium: 4.2 mEq/L (ref 3.5–5.3)
SODIUM: 138 meq/L (ref 135–145)
Total Protein: 7.2 g/dL (ref 6.0–8.3)

## 2013-11-07 LAB — LIPID PANEL
Cholesterol: 206 mg/dL — ABNORMAL HIGH (ref 0–200)
HDL: 42 mg/dL (ref >39)
LDL Cholesterol: 123 mg/dL — ABNORMAL HIGH (ref 0–99)
TRIGLYCERIDES: 206 mg/dL — AB (ref <150)
Total CHOL/HDL Ratio: 4.9 Ratio
VLDL: 41 mg/dL — ABNORMAL HIGH (ref 0–40)

## 2013-11-07 LAB — POCT CBC
Granulocyte percent: 69.7 %G (ref 37–80)
HCT, POC: 41.8 % — AB (ref 43.5–53.7)
HEMOGLOBIN: 14.4 g/dL (ref 14.1–18.1)
LYMPH, POC: 2.1 (ref 0.6–3.4)
MCH: 33.2 pg — AB (ref 27–31.2)
MCHC: 34.4 g/dL (ref 31.8–35.4)
MCV: 96.3 fL (ref 80–97)
MID (CBC): 0.7 (ref 0–0.9)
MPV: 6.1 fL (ref 0–99.8)
PLATELET COUNT, POC: 185 10*3/uL (ref 142–424)
POC Granulocyte: 6.3 (ref 2–6.9)
POC LYMPH PERCENT: 22.7 %L (ref 10–50)
POC MID %: 7.7 % (ref 0–12)
RBC: 4.34 M/uL — AB (ref 4.69–6.13)
RDW, POC: 16.6 %
WBC: 9.1 10*3/uL (ref 4.6–10.2)

## 2013-11-07 LAB — GLUCOSE, POCT (MANUAL RESULT ENTRY): POC Glucose: 122 mg/dl — AB (ref 70–99)

## 2013-11-07 LAB — POCT GLYCOSYLATED HEMOGLOBIN (HGB A1C): Hemoglobin A1C: 5

## 2013-11-07 NOTE — Patient Instructions (Addendum)
Please stop your glipizide and continue metformin twice a day

## 2013-11-07 NOTE — Progress Notes (Signed)
   Subjective:    Patient ID: Daniel [name], male    DOB: 03-Dec-1955, 58 y.o.   MRN: [id number]  HPI problem #1 diabetes. This has been doing well recently his sugars in the morning have been normal. His sugar has been as high as over 400 but recently he has had better control. He has been taking his medications regularly. He does have a peripheral neuropathy of note. Problem 2 HIV disease. He was recently changed to a new medication for control of his HIV disease. He currently has a undetectable viral load.. Depression. He continues under the care of Dr. Hulda Marin a change in medication related to depression. He recently lost a contract with [employer] is currently still working with his company but having to travel to other states. Problem #4. He continues on statin drugs for his hyperlipidemia.    Review of Systems     Objective:   Physical Exam  Constitutional: He appears well-developed and well-nourished.  HENT:  Head: Normocephalic.  Eyes: Pupils are equal, round, and reactive to light.  Neck: No tracheal deviation present. No thyromegaly present.  Cardiovascular: Normal rate, regular rhythm and normal heart sounds.   Pulmonary/Chest: Effort normal and breath sounds normal.  Abdominal: Soft. Bowel sounds are normal. He exhibits no distension. There is no tenderness.  Neurological:  He does appear somewhat depressed today but saw Dr.Kaur  Skin:   He has normal pulses in both feet. He does have discoloration from venous stasis disease. He has decreased sensation related to his peripheral neuropathy.   Results for orders placed in visit on 11/07/13  POCT GLYCOSYLATED HEMOGLOBIN (HGB A1C)      Result Value Ref Range   Hemoglobin A1C 5.0    GLUCOSE, POCT (MANUAL RESULT ENTRY)      Result Value Ref Range   POC Glucose 122 (*) 70 - 99 mg/dl  POCT CBC      Result Value Ref Range   WBC 9.1  4.6 - 10.2 K/uL   Lymph, poc 2.1  0.6 - 3.4   POC LYMPH PERCENT 22.7  10 - 50 %L   MID  (cbc) 0.7  0 - 0.9   POC MID % 7.7  0 - 12 %M   POC Granulocyte 6.3  2 - 6.9   Granulocyte percent 69.7  37 - 80 %G   RBC 4.34 (*) 4.69 - 6.13 M/uL   Hemoglobin 14.4  14.1 - 18.1 g/dL   HCT, POC 41.8 (*) 43.5 - 53.7 %   MCV 96.3  80 - 97 fL   MCH, POC 33.2 (*) 27 - 31.2 pg   MCHC 34.4  31.8 - 35.4 g/dL   RDW, POC 16.6     Platelet Count, POC 185  142 - 424 K/uL   MPV 6.1  0 - 99.8 fL          Assessment & Plan:

## 2013-11-08 LAB — MICROALBUMIN, URINE: Microalb, Ur: 19.96 mg/dL — ABNORMAL HIGH (ref 0.00–1.89)

## 2013-11-15 ENCOUNTER — Encounter: Payer: Self-pay | Admitting: Radiology

## 2013-11-15 ENCOUNTER — Telehealth: Payer: Self-pay

## 2013-11-15 NOTE — Telephone Encounter (Signed)
PATIENT STATES WE HAVE TRIED TO CALL HIM LAST WEEK AND TODAY REGARDING HIS LAB RESULTS. HE WOULD LIKE TO GET A CALL BACK AS SOON AS POSSIBLE PLEASE. BEST PHONE [phone] (CELL)  MBC

## 2013-11-15 NOTE — Telephone Encounter (Signed)
Left message on machine to call back  

## 2013-11-16 NOTE — Telephone Encounter (Signed)
Lm for rtn call-  Lab results:  His kidney function has declined most likely secondary to his diabetes. He needs to make sure he drinks fluids during the day. No change in medications at present. He should have a repeat kidney function test done in about 6 weeks. Cholesterol has increased he needs to be sure he is compliant with his cholesterol medications and try and watch his diet.

## 2013-11-17 NOTE — Telephone Encounter (Signed)
Pt advised of results. 

## 2013-11-22 ENCOUNTER — Other Ambulatory Visit: Payer: Self-pay | Admitting: Emergency Medicine

## 2013-11-22 NOTE — Telephone Encounter (Signed)
Pt called about a refill request that his pharmacy sent.  He is going out of state and has several medications that will need to be filled.  How does this work across [employer]?  Please call [phone]

## 2014-01-18 ENCOUNTER — Telehealth: Payer: Self-pay | Admitting: *Deleted

## 2014-01-18 ENCOUNTER — Other Ambulatory Visit: Payer: Self-pay | Admitting: *Deleted

## 2014-01-18 DIAGNOSIS — B20 Human immunodeficiency virus [HIV] disease: Secondary | ICD-10-CM

## 2014-01-18 MED ORDER — ELVITEG-COBIC-EMTRICIT-TENOFDF 150-150-200-300 MG PO TABS: 1.0000 | ORAL_TABLET | Freq: Every day | ORAL | Status: DC

## 2014-01-18 NOTE — Telephone Encounter (Signed)
Left message asking patient to call and schedule an appointment.  Patient was supposed to follow up for labs/md visit 6-8 weeks after switching from Atripla -> Stribild. Landis Gandy, RN

## 2014-01-26 ENCOUNTER — Other Ambulatory Visit: Payer: BC Managed Care – PPO

## 2014-01-26 ENCOUNTER — Other Ambulatory Visit: Payer: Self-pay | Admitting: *Deleted

## 2014-01-26 DIAGNOSIS — B20 Human immunodeficiency virus [HIV] disease: Secondary | ICD-10-CM

## 2014-01-26 MED ORDER — ELVITEG-COBIC-EMTRICIT-TENOFDF 150-150-200-300 MG PO TABS: 1.0000 | ORAL_TABLET | Freq: Every day | ORAL | Status: DC

## 2014-01-27 LAB — COMPLETE METABOLIC PANEL WITH GFR
ALK PHOS: 112 U/L (ref 39–117)
ALT: 23 U/L (ref 0–53)
AST: 19 U/L (ref 0–37)
Albumin: 4.3 g/dL (ref 3.5–5.2)
BUN: 24 mg/dL — ABNORMAL HIGH (ref 6–23)
CO2: 28 mEq/L (ref 19–32)
CREATININE: 1.71 mg/dL — AB (ref 0.50–1.35)
Calcium: 9.8 mg/dL (ref 8.4–10.5)
Chloride: 99 mEq/L (ref 96–112)
GFR, Est African American: 49 mL/min — ABNORMAL LOW
GFR, Est Non African American: 43 mL/min — ABNORMAL LOW
Glucose, Bld: 143 mg/dL — ABNORMAL HIGH (ref 70–99)
Potassium: 3.8 mEq/L (ref 3.5–5.3)
Sodium: 138 mEq/L (ref 135–145)
Total Bilirubin: 1.2 mg/dL (ref 0.2–1.2)
Total Protein: 7 g/dL (ref 6.0–8.3)

## 2014-01-27 LAB — CBC WITH DIFFERENTIAL/PLATELET
Basophils Absolute: 0 10*3/uL (ref 0.0–0.1)
Basophils Relative: 0 % (ref 0–1)
Eosinophils Absolute: 0.2 10*3/uL (ref 0.0–0.7)
Eosinophils Relative: 2 % (ref 0–5)
HCT: 42.8 % (ref 39.0–52.0)
HEMOGLOBIN: 15.5 g/dL (ref 13.0–17.0)
LYMPHS ABS: 2.8 10*3/uL (ref 0.7–4.0)
LYMPHS PCT: 26 % (ref 12–46)
MCH: 35.1 pg — ABNORMAL HIGH (ref 26.0–34.0)
MCHC: 36.2 g/dL — ABNORMAL HIGH (ref 30.0–36.0)
MCV: 96.8 fL (ref 78.0–100.0)
MPV: 9.2 fL — AB (ref 9.4–12.4)
Monocytes Absolute: 0.8 10*3/uL (ref 0.1–1.0)
Monocytes Relative: 8 % (ref 3–12)
Neutro Abs: 6.8 10*3/uL (ref 1.7–7.7)
Neutrophils Relative %: 64 % (ref 43–77)
Platelets: 178 10*3/uL (ref 150–400)
RBC: 4.42 MIL/uL (ref 4.22–5.81)
RDW: 13.2 % (ref 11.5–15.5)
WBC: 10.6 10*3/uL — ABNORMAL HIGH (ref 4.0–10.5)

## 2014-01-27 LAB — T-HELPER CELL (CD4) - (RCID CLINIC ONLY)
CD4 % Helper T Cell: 34 % (ref 33–55)
CD4 T Cell Abs: 1040 /uL (ref 400–2700)

## 2014-01-28 LAB — HIV-1 RNA QUANT-NO REFLEX-BLD: HIV-1 RNA Quant, Log: <1.3 {Log} (ref <1.30)

## 2014-02-03 ENCOUNTER — Other Ambulatory Visit: Payer: Self-pay | Admitting: Emergency Medicine

## 2014-02-07 ENCOUNTER — Encounter: Payer: Self-pay | Admitting: Internal Medicine

## 2014-02-07 ENCOUNTER — Ambulatory Visit (INDEPENDENT_AMBULATORY_CARE_PROVIDER_SITE_OTHER): Payer: BC Managed Care – PPO | Admitting: Internal Medicine

## 2014-02-07 VITALS — BP 111/72 | HR 87 | Temp 98.4°F | Ht 70.0 in | Wt 223.0 lb

## 2014-02-07 DIAGNOSIS — B20 Human immunodeficiency virus [HIV] disease: Secondary | ICD-10-CM | POA: Diagnosis not present

## 2014-02-07 MED ORDER — ELVITEG-COBIC-EMTRICIT-TENOFAF 150-150-200-10 MG PO TABS: 1.0000 | ORAL_TABLET | Freq: Every day | ORAL | Status: DC

## 2014-02-07 NOTE — Assessment & Plan Note (Signed)
Doing well and will change to [facility] with next refill. I discussed the benefits. I suspect his creatinine was partially from pseudo-elevation and hopefully will be lower again next time.

## 2014-02-07 NOTE — Progress Notes (Signed)
  Subjective:    Patient ID: Daniel [name], male    DOB: 10/28/1955, 58 y.o.   MRN: [id number]  HPI He comes in for routine followup of HIV on Stribild. He is been on medication since 2013 and denies any missed doses. He feels well and does see psychiatry as well. No weight loss or new changes.  Currently is in a "starvation" diet. Is eating minimally. His hemoglobin A1c has decreased significantly. He has had no issues with Stribild.   Review of Systems  Constitutional: Negative for fever, chills, fatigue and unexpected weight change.  HENT: Negative for sore throat and trouble swallowing.   Eyes: Negative for visual disturbance.  Respiratory: Negative for cough and shortness of breath.   Cardiovascular: Negative for chest pain and leg swelling.  Gastrointestinal: Negative for nausea, abdominal pain and diarrhea.  Musculoskeletal: Negative for myalgias and arthralgias.  Skin: Negative for rash.  Neurological: Negative for dizziness, light-headedness and headaches.  Hematological: Negative for adenopathy.  Psychiatric/Behavioral: Negative for dysphoric mood.       Objective:   Physical Exam  Constitutional: He appears well-developed and well-nourished. No distress.  HENT:  Mouth/Throat: No oropharyngeal exudate.  Eyes: No scleral icterus.  Cardiovascular: Normal rate, regular rhythm and normal heart sounds.   No murmur heard. Pulmonary/Chest: Effort normal and breath sounds normal. No respiratory distress.  Lymphadenopathy:    He has no cervical adenopathy.  Skin: No rash noted.          Assessment & Plan:

## 2014-03-06 ENCOUNTER — Telehealth: Payer: Self-pay | Admitting: *Deleted

## 2014-03-06 ENCOUNTER — Other Ambulatory Visit: Payer: Self-pay | Admitting: Licensed Clinical Social Worker

## 2014-03-06 DIAGNOSIS — B20 Human immunodeficiency virus [HIV] disease: Secondary | ICD-10-CM

## 2014-03-06 MED ORDER — ELVITEG-COBIC-EMTRICIT-TENOFDF 150-150-200-300 MG PO TABS: 1.0000 | ORAL_TABLET | Freq: Every day | ORAL | Status: DC

## 2014-03-06 NOTE — Telephone Encounter (Signed)
Daniel [name] was not covered by patient's insurance, called in [place].

## 2014-03-06 NOTE — Telephone Encounter (Signed)
PA paperwork for Stribild.  Paperwork completed and given to [name] to complete.  Faxed to [facility] for approval.

## 2014-03-08 ENCOUNTER — Other Ambulatory Visit: Payer: Self-pay | Admitting: Emergency Medicine

## 2014-03-13 ENCOUNTER — Other Ambulatory Visit: Payer: Self-pay | Admitting: *Deleted

## 2014-03-13 DIAGNOSIS — B20 Human immunodeficiency virus [HIV] disease: Secondary | ICD-10-CM

## 2014-03-13 MED ORDER — ELVITEG-COBIC-EMTRICIT-TENOFDF 150-150-200-300 MG PO TABS: 1.0000 | ORAL_TABLET | Freq: Every day | ORAL | Status: DC

## 2014-03-13 NOTE — Telephone Encounter (Signed)
Genvoya not covered under patient's insurance. Prior approval for Stribild #  [id number] through 03/06/16. Rx sent to [facility]. Myrtis Hopping

## 2014-05-01 ENCOUNTER — Other Ambulatory Visit: Payer: Self-pay | Admitting: Emergency Medicine

## 2014-05-09 ENCOUNTER — Other Ambulatory Visit: Payer: Self-pay | Admitting: Emergency Medicine

## 2014-06-15 ENCOUNTER — Telehealth: Payer: Self-pay

## 2014-06-15 MED ORDER — DICLOFENAC SODIUM 1 % TD GEL: 2.0000 g | Freq: Two times a day (BID) | TRANSDERMAL | Status: DC

## 2014-06-15 NOTE — Telephone Encounter (Signed)
Dr Everlene Farrier he has not been seen since September, does he need to RTC?

## 2014-06-15 NOTE — Telephone Encounter (Signed)
Pt called to let us know that the pharmacy only gave him 1 100gram tube of Voltaren, as opposed to the 3 tubes of 100gram Volatren that he has been prescribed in the past. He wants to know if we can correct this so he can avoid going to the pharmacy every month.

## 2014-06-15 NOTE — Telephone Encounter (Signed)
He can have 3 tubes called and that will be fine.

## 2014-06-15 NOTE — Telephone Encounter (Signed)
Spoke with pt advised Rx was sent for 3 tubes. Pt understood.

## 2014-06-29 ENCOUNTER — Other Ambulatory Visit: Payer: Self-pay | Admitting: Emergency Medicine

## 2014-07-18 ENCOUNTER — Telehealth: Payer: Self-pay

## 2014-07-18 ENCOUNTER — Other Ambulatory Visit: Payer: Self-pay | Admitting: Radiology

## 2014-07-18 DIAGNOSIS — E139 Other specified diabetes mellitus without complications: Secondary | ICD-10-CM

## 2014-07-18 NOTE — Telephone Encounter (Signed)
Will refer.

## 2014-07-18 NOTE — Telephone Encounter (Signed)
Yes, it is ok to refer.

## 2014-07-18 NOTE — Telephone Encounter (Signed)
Ok to refer.

## 2014-07-18 NOTE — Telephone Encounter (Signed)
Pt would like to have a referral from Dr.Daub to [facility]. Please call [phone]

## 2014-07-20 ENCOUNTER — Telehealth: Payer: Self-pay

## 2014-07-20 NOTE — Telephone Encounter (Signed)
Dr. Kellie Moor office called regarding referral for pt. Pt has an appt 07/27/14 with Dr. Gershon Crane and they need a referral put in. Okay to put referral in? Dr. Kellie Moor NPI [id number].

## 2014-07-20 NOTE — Telephone Encounter (Signed)
Will refer.

## 2014-07-20 NOTE — Telephone Encounter (Signed)
Fine to put in referral.

## 2014-07-20 NOTE — Telephone Encounter (Signed)
i'm confused. I put a referral in to [facility] on 07/18/14 for pt.  Will call [facility] tomorrow to clear this up.

## 2014-07-21 NOTE — Telephone Encounter (Signed)
Referrals will you please check on this.

## 2014-07-26 ENCOUNTER — Other Ambulatory Visit: Payer: BC Managed Care – PPO

## 2014-07-27 ENCOUNTER — Other Ambulatory Visit: Payer: 59

## 2014-07-27 ENCOUNTER — Other Ambulatory Visit: Payer: Self-pay | Admitting: Physician Assistant

## 2014-07-27 DIAGNOSIS — B20 Human immunodeficiency virus [HIV] disease: Secondary | ICD-10-CM

## 2014-07-27 LAB — BASIC METABOLIC PANEL WITH GFR
BUN: 30 mg/dL — ABNORMAL HIGH (ref 6–23)
CALCIUM: 9.4 mg/dL (ref 8.4–10.5)
CO2: 24 mEq/L (ref 19–32)
CREATININE: 2.2 mg/dL — AB (ref 0.50–1.35)
Chloride: 102 mEq/L (ref 96–112)
GFR, Est African American: 36 mL/min — ABNORMAL LOW
GFR, Est Non African American: 31 mL/min — ABNORMAL LOW
Glucose, Bld: 131 mg/dL — ABNORMAL HIGH (ref 70–99)
POTASSIUM: 3.9 meq/L (ref 3.5–5.3)
Sodium: 137 mEq/L (ref 135–145)

## 2014-07-28 ENCOUNTER — Other Ambulatory Visit: Payer: Self-pay | Admitting: Physician Assistant

## 2014-07-28 LAB — T-HELPER CELL (CD4) - (RCID CLINIC ONLY)
CD4 T CELL HELPER: 34 % (ref 33–55)
CD4 T Cell Abs: 990 /uL (ref 400–2700)

## 2014-07-31 ENCOUNTER — Other Ambulatory Visit: Payer: Self-pay | Admitting: Emergency Medicine

## 2014-07-31 LAB — HIV-1 RNA QUANT-NO REFLEX-BLD
HIV 1 RNA Quant: <20 copies/mL (ref <20)
HIV-1 RNA Quant, Log: <1.3 {Log} (ref <1.30)

## 2014-08-01 ENCOUNTER — Encounter: Payer: Self-pay | Admitting: Emergency Medicine

## 2014-08-01 ENCOUNTER — Ambulatory Visit (INDEPENDENT_AMBULATORY_CARE_PROVIDER_SITE_OTHER): Payer: 59 | Admitting: Emergency Medicine

## 2014-08-01 ENCOUNTER — Telehealth: Payer: Self-pay | Admitting: Infectious Disease

## 2014-08-01 ENCOUNTER — Other Ambulatory Visit: Payer: Self-pay | Admitting: Emergency Medicine

## 2014-08-01 ENCOUNTER — Telehealth: Payer: Self-pay

## 2014-08-01 ENCOUNTER — Ambulatory Visit (INDEPENDENT_AMBULATORY_CARE_PROVIDER_SITE_OTHER): Payer: 59

## 2014-08-01 VITALS — BP 111/68 | HR 76 | Temp 97.5°F | Resp 16 | Ht 71.75 in | Wt 224.2 lb

## 2014-08-01 DIAGNOSIS — M25532 Pain in left wrist: Principal | ICD-10-CM

## 2014-08-01 DIAGNOSIS — G8929 Other chronic pain: Secondary | ICD-10-CM

## 2014-08-01 DIAGNOSIS — N289 Disorder of kidney and ureter, unspecified: Secondary | ICD-10-CM

## 2014-08-01 DIAGNOSIS — G629 Polyneuropathy, unspecified: Secondary | ICD-10-CM

## 2014-08-01 DIAGNOSIS — Z23 Encounter for immunization: Secondary | ICD-10-CM | POA: Diagnosis not present

## 2014-08-01 DIAGNOSIS — R8290 Unspecified abnormal findings in urine: Secondary | ICD-10-CM

## 2014-08-01 DIAGNOSIS — E0849 Diabetes mellitus due to underlying condition with other diabetic neurological complication: Secondary | ICD-10-CM

## 2014-08-01 DIAGNOSIS — R7989 Other specified abnormal findings of blood chemistry: Secondary | ICD-10-CM

## 2014-08-01 DIAGNOSIS — R945 Abnormal results of liver function studies: Secondary | ICD-10-CM

## 2014-08-01 DIAGNOSIS — R269 Unspecified abnormalities of gait and mobility: Secondary | ICD-10-CM | POA: Diagnosis not present

## 2014-08-01 DIAGNOSIS — F329 Major depressive disorder, single episode, unspecified: Secondary | ICD-10-CM

## 2014-08-01 DIAGNOSIS — E0869 Diabetes mellitus due to underlying condition with other specified complication: Secondary | ICD-10-CM

## 2014-08-01 DIAGNOSIS — F32A Depression, unspecified: Secondary | ICD-10-CM

## 2014-08-01 LAB — POCT UA - MICROSCOPIC ONLY
CASTS, UR, LPF, POC: POSITIVE
Crystals, Ur, HPF, POC: NEGATIVE
Epithelial cells, urine per micros: NEGATIVE
MUCUS UA: POSITIVE

## 2014-08-01 LAB — POCT URINALYSIS DIPSTICK
Bilirubin, UA: NEGATIVE
GLUCOSE UA: 500
KETONES UA: NEGATIVE
LEUKOCYTES UA: NEGATIVE
Nitrite, UA: NEGATIVE
PH UA: 5.5
Spec Grav, UA: >=1.03
UROBILINOGEN UA: 1

## 2014-08-01 LAB — POCT GLYCOSYLATED HEMOGLOBIN (HGB A1C): Hemoglobin A1C: 5.4

## 2014-08-01 LAB — GLUCOSE, POCT (MANUAL RESULT ENTRY): POC Glucose: 124 mg/dl — AB (ref 70–99)

## 2014-08-01 MED ORDER — ZOSTER VACCINE LIVE 19400 UNT/0.65ML ~~LOC~~ SOLR: 0.6500 mL | Freq: Once | SUBCUTANEOUS | Status: DC

## 2014-08-01 MED ORDER — TETANUS-DIPHTH-ACELL PERTUSSIS 5-2.5-18.5 LF-MCG/0.5 IM SUSP: 0.5000 mL | Freq: Once | INTRAMUSCULAR | Status: DC

## 2014-08-01 MED ORDER — ELVITEG-COBIC-EMTRICIT-TENOFAF 150-150-200-10 MG PO TABS: 1.0000 | ORAL_TABLET | Freq: Every day | ORAL | Status: DC

## 2014-08-01 MED ORDER — DICLOFENAC SODIUM 1 % TD GEL: 2.0000 g | Freq: Three times a day (TID) | TRANSDERMAL | Status: DC

## 2014-08-01 NOTE — Telephone Encounter (Signed)
I called and spoke with the patient. He did receive the message and is going to change his medication. I advised him it is imperative he follow-up with [name] and I will call renal tomorrow to see when we can get him in to be seen by nephrology. I am very appreciative of the help from Dr. Lucianne Lei damm

## 2014-08-01 NOTE — Telephone Encounter (Signed)
PATIENT STATES HE HAS JUST SPOKEN WITH DR. Everlene Farrier. HE CALLED BACK TO SPEAK WITH DR. DAUB AGAIN. HE WOULD LIKE DR. DAUB TO CALL HIM BACK. BEST PHONE [phone] (CELL) MBC

## 2014-08-01 NOTE — Telephone Encounter (Signed)
Phone call from Dr Everlene Farrier  Patient with worsening GFR into 30s and CR above 2  He NEEDS TO STOP STRIBILD  I HAVE PUT RX FOR GENVOYA IN THE COMPUTER AND SENT TO [facility] (MAIL ORDER) THIS MED UNLIKE STRIBILD WILL BE SAFE WITH HIS MUCH WORSE GFR  HE NEEDS TO FOLLOWUP WITH [name] AND DR DAUB UNTIL WE GET CLARITY ON WHAT IS GOING ON WITH HIS KIDNEYS

## 2014-08-01 NOTE — Patient Instructions (Addendum)
Please stop your metformin. You cannot take this medication anymore. Wears the splint on your left wrist as needed but not all of the time. Please get approval from [name] as to whether you are a candidate to take the shingles vaccine or not. We will schedule you for a CT of the head. Please call the GI office and schedule your colonoscopy. See me for complete physical in November.

## 2014-08-01 NOTE — Progress Notes (Addendum)
Subjective:  This chart was scribed for Daniel Russian, MD by Daniel [name], at [facility].  This patient was seen in room 21 and the patient's care was started at 1:42 PM.    Patient ID: Daniel [name], male    DOB: 28-Dec-1955, 59 y.o.   MRN: [id number] Chief Complaint  Patient presents with  . Follow-up    DIABETES     HPI  HPI Comments: Daniel [name] is a 59 y.o. male with a history of HIV who presents to the [facility] for a follow up.   Balance: Patent states that he is having balance issues for the past 6 months and has fallen 2 times recently while he was in the shower.  He feels like he loses all orientation when he shuts his eyes in the shower. He denies any dizziness during these episodes.  He also fell and slipped on ice about 4 months ago which caused him to injure his left wrist/arm.  Patient states that he does not have full range of motion of his wrist.  HIV/exams/vaccinations:  Patient had his blood drawn recently by infectious disease. Patient had an eye exam (by Dr. Gershon [name]) and did not have any diabetic eye changes. He also needs a tetanus shot and shingles vaccine today.  Although he has had 2 colonoscopy appointments, he has not gone to either one.  Patient went to the dentist recently and was told that he has periodontal disease. Patient states that he has bright red blood in his stool but denies any other changes in his bowel movements. He denies any history in his family of prostate or colon cancer.   Depression: Patient states that he feels like "shit" everyday and has been seeing his doctor for it. He states that he cant deal with being old and life is depressing him.  Patient lives with his brother (who is in remission) and his aunt who is 56 years old.     Past Medical History  Diagnosis Date  . Diabetes mellitus without complication   . ADHD (attention deficit hyperactivity disorder)   . Depression   . GERD  (gastroesophageal reflux disease)   . HIV infection   . Ulcer   . Anxiety   . Clotting disorder     Current Outpatient Prescriptions on File Prior to Visit  Medication Sig Dispense Refill  . ALPRAZolam (XANAX) 1 MG tablet Take 1 mg by mouth as needed.    [name] amphetamine-dextroamphetamine (ADDERALL) 30 MG tablet Take 30 mg by mouth 3 (three) times daily.     [name] atorvastatin (LIPITOR) 10 MG tablet TAKE ONE TABLET BY MOUTH ONCE DAILY. 90 tablet 11  . diclofenac sodium (VOLTAREN) 1 % GEL Apply 2 g topically 2 (two) times daily. PATIENT NEEDS OFFICE VISIT FOR ADDITIONAL REFILLS 300 g 0  . elvitegravir-cobicistat-emtricitabine-tenofovir (STRIBILD) 150-150-200-300 MG TABS tablet Take 1 tablet by mouth daily with breakfast. 30 tablet 11  . gabapentin (NEURONTIN) 300 MG capsule TAKE 3 CAPS BY MOUTH EVERY MORNING, TAKE 2 CAPS IN THE AFTERNOON, ANDTAKE 3 CAPS AT BEDTIME. MAY INCREASE TO 3 IN AFTERNOON IF NEEDED. 135 capsule 0  . levothyroxine (SYNTHROID, LEVOTHROID) 50 MCG tablet Take 50 mcg by mouth daily before breakfast.    . losartan (COZAAR) 100 MG tablet TAKE ONE TABLE BY MOUTH ONCE DAILY 15 tablet 0  . metFORMIN (GLUCOPHAGE) 500 MG tablet Take 1 tablet (500 mg total) by mouth 2 (two) times daily.  NO MORE REFILLS WITHOUT OFFICE VISIT - 2ND NOTICE 30 tablet 0  . prochlorperazine (COMPAZINE) 10 MG tablet TAKE 1 TABLET BY MOUTH EVERY 6 HOURS AS NEEDED. 180 tablet 1  . Vortioxetine HBr (BRINTELLIX) 20 MG TABS Take 20 mg by mouth at bedtime.    [name] zolpidem (AMBIEN) 10 MG tablet Take 10 mg by mouth at bedtime as needed (take 1-2 QHS).     . Blood Glucose Monitoring Suppl (BLOOD GLUCOSE METER KIT AND SUPPLIES) Dispense based on patient and insurance preference. Use up to four times daily as directed. (FOR ICD-9 250.00, 250.01). 1 each 0  . desvenlafaxine (PRISTIQ) 50 MG 24 hr tablet Take 50 mg by mouth at bedtime.    . elvitegravir-cobicistat-emtricitabine-tenofovir (GENVOYA) 150-150-200-10 MG TABS tablet  Take 1 tablet by mouth daily with breakfast. (Patient not taking: Reported on 08/01/2014) 30 tablet 11  . fish oil-omega-3 fatty acids 1000 MG capsule Take 2 g by mouth daily.    [name] UNABLE TO FIND CPAP MACHINE with standard Aclaim nasal mask with humidifier. Set at 14 cwp 1 each 0   No current facility-administered medications on file prior to visit.    No Known Allergies  Recent Results (from the past 2160 hour(s))  T-helper cell (CD4)- ([facility] clinic only)     Status: None   Collection Time: 07/27/14  2:00 PM  Result Value Ref Range   CD4 T Cell Abs 990 400 - 2700 /uL   CD4 % Helper T Cell 34 33 - 55 %    Comment: Performed at [facility]  HIV 1 RNA quant-no reflex-bld     Status: None   Collection Time: 07/27/14  2:35 PM  Result Value Ref Range   HIV 1 RNA Quant <20 <20 copies/mL    Comment: HIV 1 RNA not detected.   HIV1 RNA Quant, Log <1.30 <1.30 log 10    Comment:   This test utilizes the [name] FDA approved Roche HIV-1 Test Kit by RT-PCR.     BASIC METABOLIC PANEL WITH GFR     Status: Abnormal   Collection Time: 07/27/14  2:35 PM  Result Value Ref Range   Sodium 137 135 - 145 mEq/L   Potassium 3.9 3.5 - 5.3 mEq/L   Chloride 102 96 - 112 mEq/L   CO2 24 19 - 32 mEq/L   Glucose, Bld 131 (H) 70 - 99 mg/dL   BUN 30 (H) 6 - 23 mg/dL   Creat 2.20 (H) 0.50 - 1.35 mg/dL   Calcium 9.4 8.4 - 10.5 mg/dL   GFR, Est African American 36 (L) mL/min   GFR, Est Non African American 31 (L) mL/min    Comment:   The estimated GFR is a calculation valid for adults (>=47 years old) that uses the CKD-EPI algorithm to adjust for age and sex. It is   not to be used for children, pregnant women, hospitalized patients,    patients on dialysis, or with rapidly changing kidney function. According to the NKDEP, eGFR >89 is normal, 60-89 shows mild impairment, 30-59 shows moderate impairment, 15-29 shows severe impairment and <15 is ESRD.         Review of Systems    Constitutional: Negative for fever and chills.  Gastrointestinal: Positive for abdominal pain and blood in stool. Negative for nausea, vomiting and diarrhea.  Musculoskeletal: Negative for neck pain and neck stiffness.       Objective:   Physical Exam  CONSTITUTIONAL: Well developed/well nourished HEAD: Normocephalic/atraumatic  EYES: EOMI/PERRL ENMT: tHe has very poor dentition with severe gingival disease NECK: supple no meningeal signs SPINE/BACK:entire spine nontender CV: S1/S2 noted, no murmurs/rubs/gallops noted LUNGS: Lungs are clear to auscultation bilaterally, no apparent distress ABDOMEN: He has some bulging flanks questionable of ascites, he has some mild tenderness right mid lower abdomen.  GU:no cva tenderness NEURO: Pt is awake/alert/appropriate, moves all extremitiesx4.  No facial droop.   EXTREMITIES: he has 2+ pulses with trace edema.  SKIN: warm, color normal PSYCH: no abnormalities of mood noted, alert and oriented to situation  Results for orders placed or performed in visit on 08/01/14  POCT glucose (manual entry)  Result Value Ref Range   POC Glucose 124 (A) 70 - 99 mg/dl  POCT glycosylated hemoglobin (Hb A1C)  Result Value Ref Range   Hemoglobin A1C 5.4       Filed Vitals:   08/01/14 1315  BP: 120/80  Pulse: 78  Temp: 97.5 F (36.4 C)  TempSrc: Oral  Resp: 16  Height: 5' 11.75" (1.822 m)  Weight: 224 lb 3.2 oz (101.696 kg)  SpO2: 99%         Assessment & Plan:  1. Diabetes mellitus due to underlying condition with other diabetic complcations  - CBC with Differential/Platelet - Comprehensive metabolic panel - POCT glucose (manual entry) - POCT glycosylated hemoglobin (Hb A1C) - Microalbumin, urine - HM Diabetes Foot Exam - POCT UA - Microscopic Only - POCT urinalysis dipstick - Urine culture  2. Depression Patient seen Dr. Robina Ade regular with recent changes in medications  3. Elevated LFTs  - Comprehensive metabolic panel  4.  Neuropathy He continues on Neurontin with some relief  5. Abnormality of gait  - CT Head Wo Contrast; Future  6. Wrist pain, chronic, left X-ray done shows a nondisplaced fracture the distal radius with ulnar styloid fracture which appears to be healing.  7. Need for diphtheria-tetanus-pertussis (Tdap) vaccine, adult/adolescent  - Tdap vaccine greater than or equal to 7yo IM  8. Need for shingles vaccine Patient told he needs to discuss this with [name] to see if he is a candidate to take the vaccine. He understands he must clearance prior to getting it - zoster vaccine live, PF, (ZOSTAVAX) 81275 UNT/0.65ML injection; Inject 19,400 Units into the skin once.  Dispense: 1 each; Refill: 0  9. Renal Disease Patient has had a progressive decline in renal function. His creatinine is up to 2.2 with a BUN 30 creatinine clearance calculated at 31. His urine shows 4+ protein with significant hematuria. I discuss this with Dr. Drucilla Schmidt and we will stop stribild and start gentle area for his HIV. We stopped his metformin and losartan. Referral made to nephrology for their help. Urine culture was also done. I suspect his renal disease is multifactorial related to medications ,HIV disease, diabetes, and hypertension. I personally performed the services described in this documentation, which was scribed in my presence. The recorded information has been reviewed and is accurate.  Arlyss Queen, MD  [facility]  08/01/2014 5:35 PM

## 2014-08-02 ENCOUNTER — Telehealth: Payer: Self-pay | Admitting: Emergency Medicine

## 2014-08-02 ENCOUNTER — Other Ambulatory Visit: Payer: Self-pay | Admitting: Emergency Medicine

## 2014-08-02 DIAGNOSIS — N183 Chronic kidney disease, stage 3 (moderate): Secondary | ICD-10-CM

## 2014-08-02 LAB — CBC WITH DIFFERENTIAL/PLATELET
BASOS ABS: 0 10*3/uL (ref 0.0–0.1)
Basophils Relative: 0 % (ref 0–1)
EOS PCT: 3 % (ref 0–5)
Eosinophils Absolute: 0.2 10*3/uL (ref 0.0–0.7)
HCT: 42.2 % (ref 39.0–52.0)
Hemoglobin: 15 g/dL (ref 13.0–17.0)
LYMPHS ABS: 2 10*3/uL (ref 0.7–4.0)
Lymphocytes Relative: 32 % (ref 12–46)
MCH: 33 pg (ref 26.0–34.0)
MCHC: 35.5 g/dL (ref 30.0–36.0)
MCV: 93 fL (ref 78.0–100.0)
MPV: 9.4 fL (ref 8.6–12.4)
Monocytes Absolute: 0.4 10*3/uL (ref 0.1–1.0)
Monocytes Relative: 7 % (ref 3–12)
NEUTROS ABS: 3.5 10*3/uL (ref 1.7–7.7)
Neutrophils Relative %: 58 % (ref 43–77)
Platelets: 139 10*3/uL — ABNORMAL LOW (ref 150–400)
RBC: 4.54 MIL/uL (ref 4.22–5.81)
RDW: 14.1 % (ref 11.5–15.5)
WBC: 6.1 10*3/uL (ref 4.0–10.5)

## 2014-08-02 LAB — COMPREHENSIVE METABOLIC PANEL
ALK PHOS: 137 U/L — AB (ref 39–117)
ALT: 18 U/L (ref 0–53)
AST: 19 U/L (ref 0–37)
Albumin: 3.9 g/dL (ref 3.5–5.2)
BUN: 22 mg/dL (ref 6–23)
CALCIUM: 9.3 mg/dL (ref 8.4–10.5)
CO2: 25 mEq/L (ref 19–32)
CREATININE: 2.36 mg/dL — AB (ref 0.50–1.35)
Chloride: 104 mEq/L (ref 96–112)
GLUCOSE: 114 mg/dL — AB (ref 70–99)
POTASSIUM: 3.6 meq/L (ref 3.5–5.3)
SODIUM: 137 meq/L (ref 135–145)
TOTAL PROTEIN: 7 g/dL (ref 6.0–8.3)
Total Bilirubin: 0.8 mg/dL (ref 0.2–1.2)

## 2014-08-02 LAB — MICROALBUMIN, URINE: MICROALB UR: 107.5 mg/dL — AB (ref <2.0)

## 2014-08-02 NOTE — Telephone Encounter (Signed)
No problem. HOpefully his renal fxn improves as well

## 2014-08-02 NOTE — Telephone Encounter (Signed)
He has an appt with [name] on 08/09/14.

## 2014-08-02 NOTE — Telephone Encounter (Signed)
Called to speak to patient no answer. Message left to return my call

## 2014-08-02 NOTE — Telephone Encounter (Signed)
Spoke with [name]. They're going to try to get patient worked in sooner. I am trying to get the patient worked in to see nephrology.

## 2014-08-03 ENCOUNTER — Telehealth: Payer: Self-pay | Admitting: *Deleted

## 2014-08-03 DIAGNOSIS — B20 Human immunodeficiency virus [HIV] disease: Secondary | ICD-10-CM

## 2014-08-03 LAB — URINE CULTURE
Colony Count: NO GROWTH
Organism ID, Bacteria: NO GROWTH

## 2014-08-03 LAB — HEPATITIS C ANTIBODY: HCV Ab: NEGATIVE

## 2014-08-03 NOTE — Telephone Encounter (Addendum)
Left message for the pt to call [facility] to schedule lab work for new HIV rx.  RN attempted to leave the pt a telephone message to STOP taking the Stribild.  Reminded pt of his upcoming MD appt next Wed., June 15.  RN sent the pt a MyChart message to STOP taking Stribild per [name] due to his kidney issues.  [name] will address this with the pt when he comes to his appt next Wed., June 15.

## 2014-08-03 NOTE — Telephone Encounter (Signed)
-----  Message from Thayer Headings, MD sent at 08/03/2014  3:10 PM EDT ----- Regarding: RE: Genvoya coverage by [facility],  NOT on approved list at the present time. Spoke with minh who contacted insurance.  He will not be able to get Genvoya but can start Triumeq.  We need an HLA [id number] first so please have him come in for the lab asap - or potentially it could be added on to recent labs done by Dr. Everlene Farrier.  Thanks  ----- Message -----    From: Ileana Roup, RN    Sent: 08/03/2014  10:23 AM      To: Thayer Headings, MD Subject: RE: Daniel [name] coverage by [facility],  NOT on approve#  We could try an "exception" based on the Kidney issue.  I will call [employer] to see if this is a possibility.  Thank you.  Langley Gauss ----- Message -----    From: Thayer Headings, MD    Sent: 08/03/2014   9:20 AM      To: Ileana Roup, RN, [name], [place] Subject: RE: Daniel [name] coverage by [facility],  NOT on approve#  Can we do a PA for this and get it covered?  He has a creat of 2 and needs that or could consider triumeq ----- Message -----    From: Ileana Roup, RN    Sent: 08/03/2014   9:12 AM      To: Truman Hayward, MD, Thayer Headings, MD Subject: Daniel [name] coverage by [facility],  NOT on approved li#  I spoke with the pt's insurance just now and it doesn't cover Genvoya. OK to switch pt back to Stribild or does he need some other medication?  Please advise.  Thank you  Langley Gauss ----- Message -----    From: Thayer Headings, MD    Sent: 08/02/2014   5:12 PM      To: [facility] Triage Nurse Pool  Can you see if he was able to change to [facility] from [place].  He has worsening kidneys and needs the change but his insurance denied it last time.

## 2014-08-04 ENCOUNTER — Encounter: Payer: Self-pay | Admitting: *Deleted

## 2014-08-04 ENCOUNTER — Telehealth: Payer: Self-pay

## 2014-08-04 NOTE — Telephone Encounter (Signed)
Pt states he was told by Dr. Everlene Farrier to call him about his gabapentin dosage so they could discuss it. Pt also has not heard anything from any nephrologists despite being referred by Dr. Everlene Farrier. This may be due to the fact that his voicemail box was not working this morning; this has since been corrected.

## 2014-08-04 NOTE — Addendum Note (Signed)
Addended by: Lorne Skeens D on: 08/04/2014 10:37 AM   Modules accepted: Orders, Medications

## 2014-08-07 ENCOUNTER — Other Ambulatory Visit: Payer: 59

## 2014-08-07 DIAGNOSIS — B20 Human immunodeficiency virus [HIV] disease: Secondary | ICD-10-CM

## 2014-08-07 NOTE — Telephone Encounter (Signed)
Referral Notes     Type Date User   General 08/04/2014 12:24 PM Daniel [name]        Note   Spoke with [facility], they stated that the patient was rated a 2 and they would be in contact with him soon for scheduling. Dr Everlene Farrier has asked that we call again on Monday

## 2014-08-07 NOTE — Telephone Encounter (Signed)
Please call again today and check on status of the referral.

## 2014-08-08 ENCOUNTER — Other Ambulatory Visit: Payer: 59

## 2014-08-08 NOTE — Telephone Encounter (Signed)
I left a message with [facility] [phone] on 08/07/14 and 08/08/14 regarding this pt referral. Waiting for returned call

## 2014-08-09 ENCOUNTER — Ambulatory Visit
Admission: RE | Admit: 2014-08-09 | Discharge: 2014-08-09 | Disposition: A | Discharge status: Home / Self Care | Payer: 59 | Source: Ambulatory Visit | Attending: Emergency Medicine | Admitting: Emergency Medicine

## 2014-08-09 ENCOUNTER — Ambulatory Visit (INDEPENDENT_AMBULATORY_CARE_PROVIDER_SITE_OTHER): Payer: 59 | Admitting: Emergency Medicine

## 2014-08-09 ENCOUNTER — Other Ambulatory Visit: Payer: Self-pay | Admitting: Emergency Medicine

## 2014-08-09 ENCOUNTER — Other Ambulatory Visit: Payer: Self-pay | Admitting: *Deleted

## 2014-08-09 ENCOUNTER — Other Ambulatory Visit: Payer: Self-pay | Admitting: Internal Medicine

## 2014-08-09 ENCOUNTER — Ambulatory Visit (INDEPENDENT_AMBULATORY_CARE_PROVIDER_SITE_OTHER): Payer: 59 | Admitting: Internal Medicine

## 2014-08-09 ENCOUNTER — Encounter: Payer: Self-pay | Admitting: Internal Medicine

## 2014-08-09 ENCOUNTER — Telehealth: Payer: Self-pay

## 2014-08-09 ENCOUNTER — Ambulatory Visit: Payer: BC Managed Care – PPO | Admitting: Internal Medicine

## 2014-08-09 VITALS — BP 130/60 | HR 88 | Temp 98.1°F | Resp 14 | Ht 71.0 in | Wt 227.4 lb

## 2014-08-09 VITALS — BP 152/77 | HR 83 | Temp 98.2°F | Ht 71.0 in | Wt 227.0 lb

## 2014-08-09 DIAGNOSIS — L03039 Cellulitis of unspecified toe: Secondary | ICD-10-CM | POA: Diagnosis not present

## 2014-08-09 DIAGNOSIS — N289 Disorder of kidney and ureter, unspecified: Secondary | ICD-10-CM | POA: Diagnosis not present

## 2014-08-09 DIAGNOSIS — B20 Human immunodeficiency virus [HIV] disease: Secondary | ICD-10-CM

## 2014-08-09 DIAGNOSIS — N189 Chronic kidney disease, unspecified: Secondary | ICD-10-CM | POA: Diagnosis not present

## 2014-08-09 DIAGNOSIS — N183 Chronic kidney disease, stage 3 unspecified: Secondary | ICD-10-CM

## 2014-08-09 DIAGNOSIS — L609 Nail disorder, unspecified: Secondary | ICD-10-CM

## 2014-08-09 DIAGNOSIS — E349 Endocrine disorder, unspecified: Secondary | ICD-10-CM

## 2014-08-09 DIAGNOSIS — G629 Polyneuropathy, unspecified: Secondary | ICD-10-CM | POA: Diagnosis not present

## 2014-08-09 DIAGNOSIS — R319 Hematuria, unspecified: Secondary | ICD-10-CM

## 2014-08-09 DIAGNOSIS — R110 Nausea: Secondary | ICD-10-CM

## 2014-08-09 DIAGNOSIS — G63 Polyneuropathy in diseases classified elsewhere: Secondary | ICD-10-CM

## 2014-08-09 DIAGNOSIS — R269 Unspecified abnormalities of gait and mobility: Secondary | ICD-10-CM

## 2014-08-09 LAB — POCT UA - MICROSCOPIC ONLY
CASTS, UR, LPF, POC: NEGATIVE
CRYSTALS, UR, HPF, POC: NEGATIVE
Epithelial cells, urine per micros: NEGATIVE
MUCUS UA: NEGATIVE
WBC, Ur, HPF, POC: NEGATIVE
Yeast, UA: NEGATIVE

## 2014-08-09 LAB — POCT URINALYSIS DIPSTICK
Bilirubin, UA: NEGATIVE
GLUCOSE UA: 500
Ketones, UA: NEGATIVE
LEUKOCYTES UA: NEGATIVE
NITRITE UA: NEGATIVE
Protein, UA: 100
Spec Grav, UA: 1.015
Urobilinogen, UA: 1
pH, UA: 7

## 2014-08-09 MED ORDER — PREGABALIN 75 MG PO CAPS: ORAL_CAPSULE | ORAL | Status: DC

## 2014-08-09 MED ORDER — AMOXICILLIN-POT CLAVULANATE 875-125 MG PO TABS: 1.0000 | ORAL_TABLET | Freq: Two times a day (BID) | ORAL | Status: DC

## 2014-08-09 MED ORDER — ABACAVIR-DOLUTEGRAVIR-LAMIVUD 600-50-300 MG PO TABS: 1.0000 | ORAL_TABLET | Freq: Every day | ORAL | Status: DC

## 2014-08-09 MED ORDER — MUPIROCIN 2 % EX OINT: 1.0000 "application " | TOPICAL_OINTMENT | Freq: Two times a day (BID) | CUTANEOUS | Status: DC

## 2014-08-09 NOTE — Patient Instructions (Signed)
Keep wound clean with soap and water twice a day. Apply ointment twice a day. Take antibiotics twice a day

## 2014-08-09 NOTE — Progress Notes (Addendum)
Subjective:  This chart was scribed for Arlyss Queen, MD by [name], Medical Scribe. This patient was seen in Room 5 and the patient's care was started 1:25 PM.    Patient ID: Daniel [name], male    DOB: [birth year], 59 y.o.   MRN: [id number]  HPI Daniel [name] is a 59 y.o. male who presents to [facility] complaining of sudden onset left big toe nail problem that started 2 days ago. He was cutting his toe nail and went too deep. It started bleeding afterwards. He states that he's all updated on Tetanus except Shingles. He's traveling to [place] for 4 months.   He has an appointment with his ID doctor today at 3:15pm.     Review of Systems  Constitutional: Negative for fever.  Skin: Positive for wound (left big toe).       Objective:   Physical Exam CONSTITUTIONAL: Well developed/Wel nourished HEAD: Normocephalic/atraumatic EYES: EOMI/PERRL ENMT: Mucous membranes moist NECK: supple no meningeal signs SPINE/BACK: entire spine nontender CV: S1/S2 noted, no murmurs/rubs/gallops noted LUNGS: Lungs are clear to auscultation bilaterally, no apparent distress ABDOMEN: soft, non tender, no rebound or guarding, bowel sounds noted throughout abdomen GU: no cva tenderness NEURO: Pt is awake/alert/appropriate, moves all extremities x4. No facial droop. EXTREMITIES: pulses normal/equal, full ROM SKIN: warm, color normal PSYCH: no abnormalities of mood noted, alert, and oriented to situation Results for orders placed or performed in visit on 08/09/14  POCT urinalysis dipstick  Result Value Ref Range   Color, UA yellow    Clarity, UA clear    Glucose, UA 500    Bilirubin, UA neg    Ketones, UA neg    Spec Grav, UA 1.015    Blood, UA moderate    pH, UA 7.0    Protein, UA 100    Urobilinogen, UA 1.0    Nitrite, UA neg    Leukocytes, UA Negative Negative  POCT UA - Microscopic Only  Result Value Ref Range   WBC, Ur, HPF, POC neg    RBC, urine, microscopic 0-1    Bacteria, U  Microscopic trace    Mucus, UA neg    Epithelial cells, urine per micros neg    Crystals, Ur, HPF, POC neg    Casts, Ur, LPF, POC neg    Yeast, UA neg    Meds ordered this encounter  Medications  . DISCONTD: amoxicillin-clavulanate (AUGMENTIN) 875-125 MG per tablet    Sig: Take 1 tablet by mouth 2 (two) times daily.    Dispense:  20 tablet    Refill:  0  . mupirocin ointment (BACTROBAN) 2 %    Sig: Place 1 application into the nose 2 (two) times daily. Apply small amount to the toe twice a day    Dispense:  22 g    Refill:  0  . DISCONTD: pregabalin (LYRICA) 75 MG capsule    Sig: Take 1 tablet twice a day    Dispense:  60 capsule    Refill:  11         Assessment & Plan:   His urine looks significantly better today. He still has moderate blood. He did have glucose in his urine he is requesting a change from gabapentin to Lyrica. I think this is appropriate. He is currently taking gabapentin thousand milligrams a day. We'll change to Lyrica 75 twice a day.. Basic metabolic panel done today. I will discuss follow-up with the patient when I received this. He is scheduled  to go over and see [name]. He is up-to-date on tetanus. His blood pressure stable off of losartan. He did have sugar in his urine but last A1c was excellent.I personally performed the services described in this documentation, which was scribed in my presence. The recorded information has been reviewed and is accurate.  Nena Jordan, MD

## 2014-08-09 NOTE — Assessment & Plan Note (Signed)
He does have an appointment with nephrology tomorrow. This may be medication related.

## 2014-08-09 NOTE — Telephone Encounter (Signed)
MED    amoxicillin-clavulanate (AUGMENTIN) 875-125 MG per    rx went to [facility] - please send to [place], [place] ([place])

## 2014-08-09 NOTE — Assessment & Plan Note (Signed)
Since he is unable to get Genvoya, I will try to get him on Triumeq though we'll need to wait for his HLA test. He does get his medication by mail so once his HLA test has resulted and if negative, I will send in Triumeq which she will get where he is out of town working. He will then get labs about 1 month after starting and I provided him with the prescriptions we can get this in [place] where he will be staying. This will be faxed to me. I will include a creatinine with GFR. May consider Genvoya in the future once it is covered on the formulary. He will then return in 4 months when he gets back into town.

## 2014-08-09 NOTE — Addendum Note (Signed)
Addended by: Arlyss Queen A on: 08/09/2014 07:28 PM   Modules accepted: Orders, Medications

## 2014-08-09 NOTE — Progress Notes (Signed)
   Subjective:    Patient ID: Daniel [name], male    DOB: 03/03/1955, 59 y.o.   MRN: [id number]  HPI He comes in here for follow-up. He has been on Stribild though recently was noted to have significantly elevated creatinine with normal BUN suggestive of intrinsic renal disease. It was mildly elevated about 6 months ago to about 1.7 her starting Stribild, likely partially due to pseudo-elevation due to cobicistat but it has persisted and worsened. I had him stop his Stribild and did do an HLA test.  I did attempt to get Genvoya as a replacement since it has no renal toxicity compared to tenofovir however his insurance denied this. He does not have chest pain or any history of a heart attack.   Review of Systems  Constitutional: Negative for fatigue.  HENT: Negative for trouble swallowing.   Gastrointestinal: Negative for nausea and diarrhea.  Genitourinary: Negative for discharge.  Skin: Negative for rash.  Psychiatric/Behavioral: Negative for sleep disturbance.       Objective:   Physical Exam  Constitutional: He appears well-developed and well-nourished. No distress.  HENT:  Mouth/Throat: No oropharyngeal exudate.  Eyes: No scleral icterus.  Cardiovascular: Normal rate, regular rhythm and normal heart sounds.   No murmur heard. Pulmonary/Chest: Effort normal and breath sounds normal. No respiratory distress.  Lymphadenopathy:    He has no cervical adenopathy.  Skin: No rash noted.          Assessment & Plan:

## 2014-08-10 ENCOUNTER — Other Ambulatory Visit: Payer: Self-pay | Admitting: Nephrology

## 2014-08-10 DIAGNOSIS — N179 Acute kidney failure, unspecified: Secondary | ICD-10-CM

## 2014-08-10 LAB — BASIC METABOLIC PANEL WITH GFR
BUN: 25 mg/dL — AB (ref 6–23)
CO2: 24 mEq/L (ref 19–32)
CREATININE: 2.88 mg/dL — AB (ref 0.50–1.35)
Calcium: 8.6 mg/dL (ref 8.4–10.5)
Chloride: 105 mEq/L (ref 96–112)
GFR, EST AFRICAN AMERICAN: 26 mL/min — AB
GFR, Est Non African American: 23 mL/min — ABNORMAL LOW
Glucose, Bld: 245 mg/dL — ABNORMAL HIGH (ref 70–99)
POTASSIUM: 3.4 meq/L — AB (ref 3.5–5.3)
Sodium: 138 mEq/L (ref 135–145)

## 2014-08-10 NOTE — Telephone Encounter (Signed)
This was taking acre of already according to patient.

## 2014-08-10 NOTE — Telephone Encounter (Signed)
Please check on this again.

## 2014-08-10 NOTE — Telephone Encounter (Signed)
It went to [facility]

## 2014-08-11 ENCOUNTER — Telehealth: Payer: Self-pay | Admitting: Emergency Medicine

## 2014-08-11 ENCOUNTER — Other Ambulatory Visit: Payer: Self-pay | Admitting: Emergency Medicine

## 2014-08-11 ENCOUNTER — Other Ambulatory Visit: Payer: 59

## 2014-08-11 ENCOUNTER — Other Ambulatory Visit: Payer: Self-pay | Admitting: Internal Medicine

## 2014-08-11 ENCOUNTER — Telehealth: Payer: Self-pay

## 2014-08-11 LAB — HLA B*5701: HLA-B 5701 W/RFLX HLA-B HIGH: NEGATIVE

## 2014-08-11 MED ORDER — ABACAVIR-DOLUTEGRAVIR-LAMIVUD 600-50-300 MG PO TABS: 1.0000 | ORAL_TABLET | Freq: Every day | ORAL | Status: DC

## 2014-08-11 NOTE — Telephone Encounter (Signed)
PA completed for Lyrica on covermymeds. Pending.

## 2014-08-11 NOTE — Telephone Encounter (Signed)
[facility] worked patient in on 08/09/14. Dr Everlene Farrier is also aware

## 2014-08-11 NOTE — Telephone Encounter (Signed)
Patient has seen the renal doctor. They have stopped his Lipitor. I told him when he is up in [place] if he needs any assistance in communicating with doctors there to not hesitate to call me and I will try and help with the situation

## 2014-08-14 ENCOUNTER — Telehealth: Payer: Self-pay | Admitting: *Deleted

## 2014-08-14 NOTE — Telephone Encounter (Signed)
PA approved through 08/10/16. Notified pharm.

## 2014-08-14 NOTE — Progress Notes (Signed)
Spoke with the patient, let him know. Thanks!

## 2014-08-14 NOTE — Telephone Encounter (Signed)
Relayed the following information per [name]: Please let him know that his HLA test is negative so he can take Triumeq and I have sent it to [facility] so he can contact them and let them know where to send it. Thanks" Daniel Gandy, RN

## 2014-08-14 NOTE — Telephone Encounter (Signed)
Is pt still on this medication?  losartan (COZAAR) 100 MG tablet [[id number]] DISCONTINUED      Order Details    Dose: 100 mg Route: Oral Frequency: Daily   Dispense Quantity:  15 tablet Refills:  0 Fills Remaining:  0          Sig: Take 1 tablet (100 mg total) by mouth daily. NO MORE REFILLS WITHOUT OFFICE VISIT - 2ND NOTICE         Discontinue Date:  08/01/2014 0848 Discontinue User:  Suszanne Finch, LPN Discontinue Reason:  Reorder   Written Date:  07/28/14 Expiration Date:  07/28/15     Start Date:  07/28/14 End Date:  07/31/14     Ordering Provider:  [name], MD Authorizing Provider:  [name], MD Ordering User:  Dallas Schimke, RN

## 2014-08-15 NOTE — Telephone Encounter (Signed)
This medication was stopped because of his declining kidney function. Please be sure we do not refill it and he does not take

## 2014-09-22 ENCOUNTER — Telehealth: Payer: Self-pay | Admitting: *Deleted

## 2014-09-22 NOTE — Telephone Encounter (Signed)
Pt able to get CMP w/ GFR done out-of-town.  CD4 and HIV VL were too expensive for him to pay for at this time.  Out-of-town lab will fax CMP as soon as results are available.  Once this pt finishes this job in [place] he will be going to [place].  He does not anticipate being back in [place] until December.

## 2014-09-26 ENCOUNTER — Telehealth: Payer: Self-pay

## 2014-09-26 NOTE — Telephone Encounter (Signed)
Patient would like to leave a message for Dr. Everlene Farrier. He would like to know if he can increase lyrica from 2 pills a day to 3 pills a day. Please call cell phone!

## 2014-09-27 ENCOUNTER — Other Ambulatory Visit: Payer: Self-pay | Admitting: Emergency Medicine

## 2014-09-27 MED ORDER — PREGABALIN 75 MG PO CAPS: 75.0000 mg | ORAL_CAPSULE | Freq: Three times a day (TID) | ORAL | Status: DC

## 2014-09-27 NOTE — Telephone Encounter (Signed)
I told patient he could take 75 of Lyrica 3 times a day.

## 2014-09-28 NOTE — Telephone Encounter (Signed)
Called in Rx and notified pt on VM done.

## 2014-11-18 ENCOUNTER — Telehealth: Payer: Self-pay

## 2014-11-18 NOTE — Telephone Encounter (Signed)
Dr. Everlene Farrier   Glucose at 720 per kidney MD.   Do you want to put him back on medication?     [place]

## 2014-11-23 ENCOUNTER — Other Ambulatory Visit: Payer: Self-pay | Admitting: Internal Medicine

## 2014-11-28 ENCOUNTER — Encounter: Payer: Self-pay | Admitting: Emergency Medicine

## 2014-11-28 NOTE — Telephone Encounter (Signed)
Dr. Everlene Farrier   Patient is in [place] and has an infected toe.  He is requesting an antibiotic be called in to [facility] and they will mail it to him.    [phone]

## 2014-11-29 MED ORDER — CEPHALEXIN 500 MG PO CAPS: 500.0000 mg | ORAL_CAPSULE | Freq: Two times a day (BID) | ORAL | Status: DC

## 2014-11-29 NOTE — Telephone Encounter (Signed)
Patient needs to be on insulin. He cannot be on oral medication. He needs to see a physician up in [place] and get started on insulin. I spoke with him and advised him to go to the emergency room previously. Hopefully they started him on insulin. Okay to start patient on cephalexin 500 mg 2 times a day #20

## 2014-11-29 NOTE — Telephone Encounter (Signed)
Sent in Rx. Pt advised message from Dr. Everlene Farrier. He states his insurance is not accepted in [place] so he going to wait until he comes back here.

## 2014-12-05 ENCOUNTER — Telehealth: Payer: Self-pay

## 2014-12-05 NOTE — Telephone Encounter (Signed)
Daub - Pt would like to ask for a script one touch ultra test strips.  He went to go buy them and they would cost him $80.  He says with a script he wouldn't have to pay this.  Pt uses [facility]. His number is [phone]

## 2014-12-06 NOTE — Telephone Encounter (Signed)
Please send a prescription in for his test strips. Give him #50 refill for 1 year.

## 2014-12-06 NOTE — Telephone Encounter (Signed)
Dr. Everlene Farrier please advise about the one touch ultra test strips.  Can we send this into [facility] for the pt?  thanks

## 2014-12-07 MED ORDER — GLUCOSE BLOOD VI STRP: ORAL_STRIP | Status: DC

## 2014-12-07 NOTE — Telephone Encounter (Signed)
Sent in Rx and notified pt.

## 2014-12-12 ENCOUNTER — Ambulatory Visit: Payer: 59 | Admitting: Internal Medicine

## 2015-01-10 ENCOUNTER — Other Ambulatory Visit: Payer: 59

## 2015-01-10 ENCOUNTER — Ambulatory Visit (INDEPENDENT_AMBULATORY_CARE_PROVIDER_SITE_OTHER): Payer: 59 | Admitting: Emergency Medicine

## 2015-01-10 VITALS — BP 168/90 | HR 88 | Temp 99.0°F | Resp 16 | Ht 71.0 in | Wt 207.8 lb

## 2015-01-10 DIAGNOSIS — Z23 Encounter for immunization: Secondary | ICD-10-CM | POA: Diagnosis not present

## 2015-01-10 DIAGNOSIS — I10 Essential (primary) hypertension: Secondary | ICD-10-CM

## 2015-01-10 DIAGNOSIS — E0849 Diabetes mellitus due to underlying condition with other diabetic neurological complication: Secondary | ICD-10-CM

## 2015-01-10 DIAGNOSIS — N189 Chronic kidney disease, unspecified: Secondary | ICD-10-CM | POA: Diagnosis not present

## 2015-01-10 DIAGNOSIS — B20 Human immunodeficiency virus [HIV] disease: Secondary | ICD-10-CM

## 2015-01-10 DIAGNOSIS — N183 Chronic kidney disease, stage 3 unspecified: Secondary | ICD-10-CM

## 2015-01-10 LAB — COMPLETE METABOLIC PANEL WITH GFR
ALT: 14 U/L (ref 9–46)
AST: 14 U/L (ref 10–35)
Albumin: 4.3 g/dL (ref 3.6–5.1)
Alkaline Phosphatase: 125 U/L — ABNORMAL HIGH (ref 40–115)
BILIRUBIN TOTAL: 1.1 mg/dL (ref 0.2–1.2)
BUN: 18 mg/dL (ref 7–25)
CHLORIDE: 98 mmol/L (ref 98–110)
CO2: 25 mmol/L (ref 20–31)
Calcium: 9.5 mg/dL (ref 8.6–10.3)
Creat: 1.7 mg/dL — ABNORMAL HIGH (ref 0.70–1.25)
GFR, EST AFRICAN AMERICAN: 49 mL/min — AB (ref >=60)
GFR, EST NON AFRICAN AMERICAN: 43 mL/min — AB (ref >=60)
Glucose, Bld: 305 mg/dL — ABNORMAL HIGH (ref 65–99)
Potassium: 3.7 mmol/L (ref 3.5–5.3)
Sodium: 136 mmol/L (ref 135–146)
TOTAL PROTEIN: 6.9 g/dL (ref 6.1–8.1)

## 2015-01-10 LAB — CBC WITH DIFFERENTIAL/PLATELET
Basophils Absolute: 0 10*3/uL (ref 0.0–0.1)
Basophils Relative: 0 % (ref 0–1)
EOS ABS: 0.1 10*3/uL (ref 0.0–0.7)
EOS PCT: 1 % (ref 0–5)
HCT: 44.3 % (ref 39.0–52.0)
Hemoglobin: 15.8 g/dL (ref 13.0–17.0)
LYMPHS ABS: 2.3 10*3/uL (ref 0.7–4.0)
Lymphocytes Relative: 23 % (ref 12–46)
MCH: 33.1 pg (ref 26.0–34.0)
MCHC: 35.7 g/dL (ref 30.0–36.0)
MCV: 92.9 fL (ref 78.0–100.0)
MONO ABS: 0.8 10*3/uL (ref 0.1–1.0)
MONOS PCT: 8 % (ref 3–12)
MPV: 9.1 fL (ref 8.6–12.4)
Neutro Abs: 6.8 10*3/uL (ref 1.7–7.7)
Neutrophils Relative %: 68 % (ref 43–77)
PLATELETS: 140 10*3/uL — AB (ref 150–400)
RBC: 4.77 MIL/uL (ref 4.22–5.81)
RDW: 13 % (ref 11.5–15.5)
WBC: 10 10*3/uL (ref 4.0–10.5)

## 2015-01-10 LAB — HEMOGLOBIN A1C: Hgb A1c MFr Bld: 10.5 % — AB (ref 4.0–6.0)

## 2015-01-10 LAB — GLUCOSE, POCT (MANUAL RESULT ENTRY): POC GLUCOSE: 351 mg/dL — AB (ref 70–99)

## 2015-01-10 LAB — POCT GLYCOSYLATED HEMOGLOBIN (HGB A1C): Hemoglobin A1C: 10.5

## 2015-01-10 MED ORDER — INSULIN GLARGINE 100 UNIT/ML SOLOSTAR PEN: 20.0000 [IU] | PEN_INJECTOR | Freq: Every day | SUBCUTANEOUS | Status: DC

## 2015-01-10 MED ORDER — GLUCOSE BLOOD VI STRP
ORAL_STRIP | Status: DC
Start: 2015-01-10 — End: 2016-06-28

## 2015-01-10 NOTE — Progress Notes (Signed)
Subjective:    Patient ID: Daniel [name], male    DOB: 12/05/1955, 59 y.o.   MRN: [id number]  HPI Patient presents for DM and HTN follow up.   States that he has not been taking any medications for DM or HTN. Was d/c on glipizide and metformin 3 months ago and did not start insulin upon Dr. Perfecto [name] request because he was in [place] for work and insurance would not pay for medication despite having elevated glucose. Not following any particular diet, however, states that when he was out of town working was eating only take out and fast food. Does not exercise. Denies HA, dizziness, change in vision, N/V. States that neuropathy in feet is unchanged. States that lyrica helps, but does not resolve. Admits to picking feet often, but has not seen any sores/ulcers. Fasting am glucose ranges between 2-450 and checks sporadically.   Not taking medication for HTN, Was d/c from Lipitor due to kidney fxn and patient thought this was his BP medication. Not sure when he stopped taking Cozaar. Does not check BP at home. Had appt with Nephrologist Daniel [name] today and she will not be adding any medications for BP til after labs result. Denies SOB, CP, palpitations, edema.   Just coming from periodontal appt and had 2 teeth pulled and started on antibiotics.   Review of Systems As noted above.    Objective:   Physical Exam  Constitutional: He is oriented to person, place, and time. He appears well-developed and well-nourished. No distress.  Blood pressure 168/90, pulse 88, temperature 99 F (37.2 C), temperature source Oral, resp. rate 16, height 5' 11"  (1.803 m), weight 207 lb 12.8 oz (94.257 kg), SpO2 98 %.  HENT:  Head: Normocephalic and atraumatic.  Right Ear: External ear normal.  Left Ear: External ear normal.  Eyes: Conjunctivae are normal. Pupils are equal, round, and reactive to light. Right eye exhibits no discharge. Left eye exhibits no discharge. No scleral icterus.  Neck: Normal range  of motion. Neck supple. No JVD present. No thyromegaly present.  Cardiovascular: Normal rate, regular rhythm, normal heart sounds and intact distal pulses.  Exam reveals no gallop and no friction rub.   No murmur heard. Pulmonary/Chest: Effort normal and breath sounds normal. No respiratory distress. He has no wheezes. He has no rales.  Musculoskeletal: He exhibits no edema or tenderness.  Lymphadenopathy:    He has no cervical adenopathy.  Neurological: He is alert and oriented to person, place, and time.  Skin: Skin is warm and dry. No rash noted. He is not diaphoretic. No erythema. No pallor.  Psychiatric: He has a normal mood and affect. His behavior is normal. Judgment and thought content normal.    Results for orders placed or performed in visit on 01/10/15  POCT glucose (manual entry)  Result Value Ref Range   POC Glucose 351 (A) 70 - 99 mg/dl  POCT glycosylated hemoglobin (Hb A1C)  Result Value Ref Range   Hemoglobin A1C 10.5    Diabetic Foot Exam - Simple   Simple Foot Form  Diabetic Foot exam was performed with the following findings:  Yes 01/10/2015  2:10 PM  Visual Inspection  See comments:  Yes  Sensation Testing  See comments:  Yes  Pulse Check  Posterior Tibialis and Dorsalis pulse intact bilaterally:  Yes  Comments  Feet calloused and scaling. No ulcerations present. Sensation deficits present in both feet.        Assessment & Plan:  1.  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 Diet modifications given. Use of insulin pen demonstration given. Patient understands function. Is to increase insulin by 2 units every 3 days and check glucose every morning. More test strips ordered. RTC for f/u in 4 weeks.  - POCT glucose (manual entry) - POCT glycosylated hemoglobin (Hb A1C) - Insulin Glargine (LANTUS SOLOSTAR) 100 UNIT/ML Solostar Pen; Inject 20 Units into the skin at bedtime.  After 1 week increase insulin by 2 units.  Dispense: 5 pen; Refill: 0  2. Chronic renal insufficiency, stage III (moderate) 3. Essential hypertension F/u with Daniel [name] today and will defer to her for addition of BP medications.  4. Influenza vaccine needed - Flu Vaccine QUAD 36+ mos IM   Daniel Livsey PA-C  [facility] 01/10/2015 3:13 PM

## 2015-01-10 NOTE — Patient Instructions (Signed)
Diabetes Mellitus and Food It is important for you to manage your blood sugar (glucose) level. Your blood glucose level can be greatly affected by what you eat. Eating healthier foods in the appropriate amounts throughout the day at about the same time each day will help you control your blood glucose level. It can also help slow or prevent worsening of your diabetes mellitus. Healthy eating may even help you improve the level of your blood pressure and reach or maintain a healthy weight.  General recommendations for healthful eating and cooking habits include:  Eating meals and snacks regularly. Avoid going long periods of time without eating to lose weight.  Eating a diet that consists mainly of plant-based foods, such as fruits, vegetables, nuts, legumes, and whole grains.  Using low-heat cooking methods, such as baking, instead of high-heat cooking methods, such as deep frying. Work with your dietitian to make sure you understand how to use the Nutrition Facts information on food labels. HOW CAN FOOD AFFECT ME? Carbohydrates Carbohydrates affect your blood glucose level more than any other type of food. Your dietitian will help you determine how many carbohydrates to eat at each meal and teach you how to count carbohydrates. Counting carbohydrates is important to keep your blood glucose at a healthy level, especially if you are using insulin or taking certain medicines for diabetes mellitus. Alcohol Alcohol can cause sudden decreases in blood glucose (hypoglycemia), especially if you use insulin or take certain medicines for diabetes mellitus. Hypoglycemia can be a life-threatening condition. Symptoms of hypoglycemia (sleepiness, dizziness, and disorientation) are similar to symptoms of having too much alcohol.  If your health care provider has given you approval to drink alcohol, do so in moderation and use the following guidelines:  Women should not have more than one drink per day, and men  should not have more than two drinks per day. One drink is equal to:  12 oz of beer.  5 oz of wine.  1 oz of hard liquor.  Do not drink on an empty stomach.  Keep yourself hydrated. Have water, diet soda, or unsweetened iced tea.  Regular soda, juice, and other mixers might contain a lot of carbohydrates and should be counted. WHAT FOODS ARE NOT RECOMMENDED? As you make food choices, it is important to remember that all foods are not the same. Some foods have fewer nutrients per serving than other foods, even though they might have the same number of calories or carbohydrates. It is difficult to get your body what it needs when you eat foods with fewer nutrients. Examples of foods that you should avoid that are high in calories and carbohydrates but low in nutrients include:  Trans fats (most processed foods list trans fats on the Nutrition Facts label).  Regular soda.  Juice.  Candy.  Sweets, such as cake, pie, doughnuts, and cookies.  Fried foods. WHAT FOODS CAN I EAT? Eat nutrient-rich foods, which will nourish your body and keep you healthy. The food you should eat also will depend on several factors, including:  The calories you need.  The medicines you take.  Your weight.  Your blood glucose level.  Your blood pressure level.  Your cholesterol level. You should eat a variety of foods, including:  Protein.  Lean cuts of meat.  Proteins low in saturated fats, such as fish, egg whites, and beans. Avoid processed meats.  Fruits and vegetables.  Fruits and vegetables that may help control blood glucose levels, such as apples, mangoes, and   yams.  Dairy products.  Choose fat-free or low-fat dairy products, such as milk, yogurt, and cheese.  Grains, bread, pasta, and rice.  Choose whole grain products, such as multigrain bread, whole oats, and brown rice. These foods may help control blood pressure.  Fats.  Foods containing healthful fats, such as nuts,  avocado, olive oil, canola oil, and fish. DOES EVERYONE WITH DIABETES MELLITUS HAVE THE SAME MEAL PLAN? Because every person with diabetes mellitus is different, there is not one meal plan that works for everyone. It is very important that you meet with a dietitian who will help you create a meal plan that is just right for you.   This information is not intended to replace advice given to you by your health care provider. Make sure you discuss any questions you have with your health care provider.   Document Released: 11/07/2004 Document Revised: 03/03/2014 Document Reviewed: 01/07/2013 Elsevier Interactive Patient Education 2016 Else[employer] Inc.  

## 2015-01-11 ENCOUNTER — Encounter: Payer: Self-pay | Admitting: Internal Medicine

## 2015-01-11 LAB — RPR

## 2015-01-12 ENCOUNTER — Telehealth: Payer: Self-pay | Admitting: Physician Assistant

## 2015-01-12 LAB — T-HELPER CELL (CD4) - (RCID CLINIC ONLY)
CD4 T CELL ABS: 920 /uL (ref 400–2700)
CD4 T CELL HELPER: 40 % (ref 33–55)

## 2015-01-12 LAB — HIV-1 RNA QUANT-NO REFLEX-BLD: HIV 1 RNA Quant: <20 copies/mL (ref <20)

## 2015-01-12 MED ORDER — ATORVASTATIN CALCIUM 10 MG PO TABS: ORAL_TABLET | ORAL | Status: DC

## 2015-01-12 NOTE — Telephone Encounter (Signed)
Discussed lab results. Re-start atorvastin. Pt request rx sent to apothecary. Doing well with insulin. Will see Dr. Everlene Farrier in 4 weeks and have additional labs done at that time.

## 2015-01-24 ENCOUNTER — Encounter: Payer: Self-pay | Admitting: Family Medicine

## 2015-02-02 ENCOUNTER — Other Ambulatory Visit: Payer: Self-pay | Admitting: *Deleted

## 2015-02-02 MED ORDER — ABACAVIR-DOLUTEGRAVIR-LAMIVUD 600-50-300 MG PO TABS: 1.0000 | ORAL_TABLET | Freq: Every day | ORAL | Status: DC

## 2015-02-09 ENCOUNTER — Emergency Department (EMERGENCY_DEPARTMENT_HOSPITAL): Payer: 59

## 2015-02-09 ENCOUNTER — Emergency Department (HOSPITAL_COMMUNITY)
Admission: EM | Admit: 2015-02-09 | Discharge: 2015-02-09 | Disposition: A | Discharge status: Home / Self Care | Payer: 59 | Attending: Emergency Medicine | Admitting: Emergency Medicine

## 2015-02-09 ENCOUNTER — Ambulatory Visit (INDEPENDENT_AMBULATORY_CARE_PROVIDER_SITE_OTHER): Payer: 59

## 2015-02-09 ENCOUNTER — Ambulatory Visit (INDEPENDENT_AMBULATORY_CARE_PROVIDER_SITE_OTHER): Payer: 59 | Admitting: Emergency Medicine

## 2015-02-09 ENCOUNTER — Emergency Department (HOSPITAL_COMMUNITY): Payer: 59

## 2015-02-09 ENCOUNTER — Encounter (HOSPITAL_COMMUNITY): Payer: Self-pay | Admitting: Emergency Medicine

## 2015-02-09 VITALS — BP 132/80 | HR 90 | Temp 98.3°F | Resp 18 | Ht 70.5 in | Wt 217.0 lb

## 2015-02-09 DIAGNOSIS — F909 Attention-deficit hyperactivity disorder, unspecified type: Secondary | ICD-10-CM | POA: Insufficient documentation

## 2015-02-09 DIAGNOSIS — E0849 Diabetes mellitus due to underlying condition with other diabetic neurological complication: Secondary | ICD-10-CM

## 2015-02-09 DIAGNOSIS — B20 Human immunodeficiency virus [HIV] disease: Secondary | ICD-10-CM | POA: Diagnosis not present

## 2015-02-09 DIAGNOSIS — N183 Chronic kidney disease, stage 3 unspecified: Secondary | ICD-10-CM

## 2015-02-09 DIAGNOSIS — I82401 Acute embolism and thrombosis of unspecified deep veins of right lower extremity: Secondary | ICD-10-CM | POA: Insufficient documentation

## 2015-02-09 DIAGNOSIS — E119 Type 2 diabetes mellitus without complications: Secondary | ICD-10-CM | POA: Diagnosis not present

## 2015-02-09 DIAGNOSIS — Z794 Long term (current) use of insulin: Secondary | ICD-10-CM | POA: Insufficient documentation

## 2015-02-09 DIAGNOSIS — F419 Anxiety disorder, unspecified: Secondary | ICD-10-CM | POA: Diagnosis not present

## 2015-02-09 DIAGNOSIS — M25532 Pain in left wrist: Secondary | ICD-10-CM

## 2015-02-09 DIAGNOSIS — K219 Gastro-esophageal reflux disease without esophagitis: Secondary | ICD-10-CM | POA: Insufficient documentation

## 2015-02-09 DIAGNOSIS — R7989 Other specified abnormal findings of blood chemistry: Secondary | ICD-10-CM | POA: Diagnosis not present

## 2015-02-09 DIAGNOSIS — Z862 Personal history of diseases of the blood and blood-forming organs and certain disorders involving the immune mechanism: Secondary | ICD-10-CM | POA: Diagnosis not present

## 2015-02-09 DIAGNOSIS — F329 Major depressive disorder, single episode, unspecified: Secondary | ICD-10-CM

## 2015-02-09 DIAGNOSIS — Z79899 Other long term (current) drug therapy: Secondary | ICD-10-CM | POA: Insufficient documentation

## 2015-02-09 DIAGNOSIS — R609 Edema, unspecified: Secondary | ICD-10-CM | POA: Diagnosis not present

## 2015-02-09 DIAGNOSIS — S62102A Fracture of unspecified carpal bone, left wrist, initial encounter for closed fracture: Secondary | ICD-10-CM | POA: Diagnosis not present

## 2015-02-09 DIAGNOSIS — M7989 Other specified soft tissue disorders: Secondary | ICD-10-CM

## 2015-02-09 DIAGNOSIS — Z87891 Personal history of nicotine dependence: Secondary | ICD-10-CM | POA: Diagnosis not present

## 2015-02-09 DIAGNOSIS — N189 Chronic kidney disease, unspecified: Secondary | ICD-10-CM

## 2015-02-09 DIAGNOSIS — R945 Abnormal results of liver function studies: Secondary | ICD-10-CM

## 2015-02-09 DIAGNOSIS — Z86711 Personal history of pulmonary embolism: Secondary | ICD-10-CM | POA: Diagnosis not present

## 2015-02-09 DIAGNOSIS — Z21 Asymptomatic human immunodeficiency virus [HIV] infection status: Secondary | ICD-10-CM | POA: Insufficient documentation

## 2015-02-09 DIAGNOSIS — F32A Depression, unspecified: Secondary | ICD-10-CM

## 2015-02-09 LAB — PROTIME-INR
INR: 0.94 (ref 0.00–1.49)
Prothrombin Time: 12.8 seconds (ref 11.6–15.2)

## 2015-02-09 LAB — COMPREHENSIVE METABOLIC PANEL
ALK PHOS: 119 U/L (ref 38–126)
ALT: 19 U/L (ref 17–63)
ANION GAP: 7 (ref 5–15)
AST: 16 U/L (ref 15–41)
Albumin: 3.9 g/dL (ref 3.5–5.0)
BUN: 13 mg/dL (ref 6–20)
CALCIUM: 9 mg/dL (ref 8.9–10.3)
CO2: 29 mmol/L (ref 22–32)
Chloride: 104 mmol/L (ref 101–111)
Creatinine, Ser: 1.52 mg/dL — ABNORMAL HIGH (ref 0.61–1.24)
GFR calc non Af Amer: 48 mL/min — ABNORMAL LOW (ref >60)
GFR, EST AFRICAN AMERICAN: 55 mL/min — AB (ref >60)
Glucose, Bld: 142 mg/dL — ABNORMAL HIGH (ref 65–99)
POTASSIUM: 3.8 mmol/L (ref 3.5–5.1)
SODIUM: 140 mmol/L (ref 135–145)
Total Bilirubin: 1 mg/dL (ref 0.3–1.2)
Total Protein: 7.1 g/dL (ref 6.5–8.1)

## 2015-02-09 LAB — CBC WITH DIFFERENTIAL/PLATELET
Basophils Absolute: 0 10*3/uL (ref 0.0–0.1)
Basophils Relative: 0 %
EOS ABS: 0.2 10*3/uL (ref 0.0–0.7)
EOS PCT: 3 %
HCT: 41.8 % (ref 39.0–52.0)
HEMOGLOBIN: 15 g/dL (ref 13.0–17.0)
LYMPHS ABS: 2.3 10*3/uL (ref 0.7–4.0)
Lymphocytes Relative: 33 %
MCH: 33.6 pg (ref 26.0–34.0)
MCHC: 35.9 g/dL (ref 30.0–36.0)
MCV: 93.5 fL (ref 78.0–100.0)
MONO ABS: 0.8 10*3/uL (ref 0.1–1.0)
MONOS PCT: 11 %
Neutro Abs: 3.7 10*3/uL (ref 1.7–7.7)
Neutrophils Relative %: 53 %
PLATELETS: 128 10*3/uL — AB (ref 150–400)
RBC: 4.47 MIL/uL (ref 4.22–5.81)
RDW: 13.7 % (ref 11.5–15.5)
WBC: 7 10*3/uL (ref 4.0–10.5)

## 2015-02-09 LAB — GLUCOSE, POCT (MANUAL RESULT ENTRY): POC Glucose: 206 mg/dl — AB (ref 70–99)

## 2015-02-09 LAB — POCT GLYCOSYLATED HEMOGLOBIN (HGB A1C): Hemoglobin A1C: 8.3

## 2015-02-09 MED ORDER — OXYCODONE HCL 5 MG PO TABS
5.0000 mg | ORAL_TABLET | ORAL | Status: DC | PRN
Start: 2015-02-09 — End: 2015-02-09

## 2015-02-09 MED ORDER — INSULIN GLARGINE 100 UNIT/ML SOLOSTAR PEN: PEN_INJECTOR | SUBCUTANEOUS | Status: DC

## 2015-02-09 MED ORDER — XARELTO VTE STARTER PACK 15 & 20 MG PO TBPK: 15.0000 mg | ORAL_TABLET | ORAL | Status: DC

## 2015-02-09 NOTE — ED Provider Notes (Signed)
CSN: [id number]     Arrival date & time 02/09/15  1404 History   First MD Initiated Contact with Patient 02/09/15 1505     Chief Complaint  Patient presents with  . DVT     (Consider location/radiation/quality/duration/timing/severity/associated sxs/prior Treatment) HPI Comments: 10 days ago fell down the stairs, fractured left wrist Felt knot in groin which went away one month ago Swelling started right leg about 10 days ago Brother noticed it days ago No increased pain Mild swelling Hx of superficial clot in leg, this is not the same Hx of PE, no longer on anticoagulation (41yr)    Past Medical History  Diagnosis Date  . Diabetes mellitus without complication ([place]   . ADHD (attention deficit hyperactivity disorder)   . Depression   . GERD (gastroesophageal reflux disease)   . HIV infection ([place]   . Ulcer   . Anxiety   . Clotting disorder ([facility]    Past Surgical History  Procedure Laterality Date  . Small intestine surgery     Family History  Problem Relation Age of Onset  . Cancer Brother   . COPD Mother    Social History  Substance Use Topics  . Smoking status: Former Smoker -- 0.10 packs/day for 10 years    Types: Cigars, Cigarettes  . Smokeless tobacco: Never Used  . Alcohol Use: No    Review of Systems  Constitutional: Negative for fever.  HENT: Negative for sore throat.   Eyes: Negative for visual disturbance.  Respiratory: Negative for shortness of breath.   Cardiovascular: Positive for leg swelling. Negative for chest pain.  Gastrointestinal: Negative for nausea, vomiting and abdominal pain.  Genitourinary: Negative for difficulty urinating.  Musculoskeletal: Negative for myalgias, back pain and neck stiffness.  Skin: Negative for rash.  Neurological: Negative for syncope and headaches.      Allergies  Sustiva  Home Medications   Prior to Admission medications   Medication Sig Start Date End Date Taking? Authorizing Provider   abacavir-dolutegravir-lamiVUDine (TRIUMEQ) 600-50-300 MG tablet Take 1 tablet by mouth daily. 02/02/15  Yes RThayer Headings MD  ALPRAZolam (Duanne Moron 1 MG tablet Take 1 mg by mouth 3 (three) times daily as needed for anxiety.    Yes Historical Provider, MD  amphetamine-dextroamphetamine (ADDERALL) 30 MG tablet Take 30 mg by mouth 3 (three) times daily.    Yes Historical Provider, MD  atorvastatin (LIPITOR) 10 MG tablet TAKE ONE TABLET BY MOUTH ONCE DAILY. 01/12/15  Yes Tishira R Brewington, PA-C  diclofenac sodium (VOLTAREN) 1 % GEL Apply 2 g topically 3 (three) times daily. Apply 2 gr each knee in the am 1 gr to each knee in the afternoon and 2 gr to each knee at bedtime. 08/01/14  Yes SDarlyne Russian MD  Insulin Glargine (LANTUS SOLOSTAR) 100 UNIT/ML Solostar Pen Inject 30 units at bedtime 02/09/15  Yes SDarlyne Russian MD  levothyroxine (SYNTHROID, LEVOTHROID) 50 MCG tablet Take 75 mcg by mouth daily before breakfast.    Yes Historical Provider, MD  oxyCODONE (OXY IR/ROXICODONE) 5 MG immediate release tablet Take 0.5 tablets by mouth every 6 (six) hours as needed. Pain. 01/10/15  Yes Historical Provider, MD  pregabalin (LYRICA) 75 MG capsule Take 1 capsule (75 mg total) by mouth 3 (three) times daily. 09/27/14  Yes SDarlyne Russian MD  prochlorperazine (COMPAZINE) 10 MG tablet TAKE ONE TABLET BY MOUTH EVERY 6 HOURS AS NEEDED. 11/23/14  Yes RThayer Headings MD  Ranitidine HCl (RANITIDINE 75 PO) Take 1  tablet by mouth daily as needed (heartburn).    Yes Historical Provider, MD  TRINTELLIX 20 MG TABS Take 20 mg by mouth at bedtime. 01/26/15  Yes Historical Provider, MD  UNABLE TO FIND CPAP MACHINE with standard Aclaim nasal mask with humidifier. Set at 14 cwp 04/12/13  Yes [name], MD  Vortioxetine HBr (BRINTELLIX) 20 MG TABS Take 20 mg by mouth at bedtime.   Yes Historical Provider, MD  zolpidem (AMBIEN) 10 MG tablet Take 10-20 mg by mouth at bedtime as needed for sleep.    Yes Historical Provider, MD   amoxicillin-clavulanate (AUGMENTIN) 875-125 MG per tablet Take 1 tablet by mouth 2 (two) times daily. Patient not taking: Reported on 01/10/2015 08/09/14   [name], MD  Blood Glucose Monitoring Suppl (BLOOD GLUCOSE METER KIT AND SUPPLIES) Dispense based on patient and insurance preference. Use up to four times daily as directed. (FOR ICD-9 250.00, 250.01). 09/08/13   [name], MD  cephALEXin (KEFLEX) 500 MG capsule Take 1 capsule (500 mg total) by mouth 2 (two) times daily. Patient not taking: Reported on 02/09/2015 11/29/14   [name], MD  glucose blood test strip Test blood sugar daily. Dx E11.9 01/10/15   Tishira R Brewington, PA-C  mupirocin ointment (BACTROBAN) 2 % Place 1 application into the nose 2 (two) times daily. Apply small amount to the toe twice a day Patient not taking: Reported on 02/09/2015 08/09/14   [name], MD  XARELTO STARTER PACK 15 & 20 MG TBPK Take 15-20 mg by mouth as directed. Take as directed on package: Start with one 68m tablet by mouth twice a day with food. On Day 22, switch to one 230mtablet once a day with food. 02/09/15   ErGareth MorganMD  zoster vaccine live, PF, (ZOSTAVAX) 1982505NT/0.65ML injection Inject 19,400 Units into the skin once. Patient not taking: Reported on 01/10/2015 08/01/14   StDarlyne RussianMD   BP 122/71 mmHg  Pulse 67  Temp(Src) 97 F (36.1 C) (Oral)  Resp 12  SpO2 100% Physical Exam  Constitutional: He is oriented to person, place, and time. He appears well-developed and well-nourished. No distress.  HENT:  Head: Normocephalic and atraumatic.  Eyes: Conjunctivae and EOM are normal.  Neck: Normal range of motion.  Cardiovascular: Normal rate, regular rhythm, normal heart sounds and intact distal pulses.  Exam reveals no gallop and no friction rub.   No murmur heard. Pulmonary/Chest: Effort normal and breath sounds normal. No respiratory distress. He has no wheezes. He has no rales.  Abdominal: Soft. He exhibits no  distension. There is no tenderness. There is no guarding.  Musculoskeletal: He exhibits edema (mild right lower extremity).  Neurological: He is alert and oriented to person, place, and time.  Skin: Skin is warm and dry. He is not diaphoretic.  Nursing note and vitals reviewed.   ED Course  Procedures (including critical care time) Labs Review Labs Reviewed  CBC WITH DIFFERENTIAL/PLATELET - Abnormal; Notable for the following:    Platelets 128 (*)    All other components within normal limits  COMPREHENSIVE METABOLIC PANEL - Abnormal; Notable for the following:    Glucose, Bld 142 (*)    Creatinine, Ser 1.52 (*)    GFR calc non Af Amer 48 (*)    GFR calc Af Amer 55 (*)    All other components within normal limits  PROTIME-INR    Imaging Review Dg Wrist 2 Views Left  02/09/2015  CLINICAL DATA:  Fall 10 days ago. EXAM: LEFT WRIST - 2 VIEW COMPARISON:  08/01/2014 FINDINGS: There is an age indeterminate deformity involving the all ulnar styloid. Interval healing of distal radius fracture. No dislocations. IMPRESSION: 1. Ulnar styloid fracture, age indeterminate. 2. Interval healing of distal radius fracture. Electronically Signed   By: Kerby Moors M.D.   On: 02/09/2015 16:48   Ct Head Wo Contrast  02/09/2015  CLINICAL DATA:  59 year old male with headache and dizziness following fall and head injury 2 weeks ago. EXAM: CT HEAD WITHOUT CONTRAST TECHNIQUE: Contiguous axial images were obtained from the base of the skull through the vertex without intravenous contrast. COMPARISON:  08/09/2014 CT FINDINGS: Very mild chronic small-vessel white matter ischemic changes are noted. No acute intracranial abnormalities are identified, including mass lesion or mass effect, hydrocephalus, extra-axial fluid collection, midline shift, hemorrhage, or acute infarction. The visualized bony calvarium is unremarkable. IMPRESSION: No evidence of acute intracranial abnormality. Electronically Signed   By:  Margarette Canada M.D.   On: 02/09/2015 17:53   I have personally reviewed and evaluated these images and lab results as part of my medical decision-making.   EKG Interpretation None      MDM   Final diagnoses:  Swelling  Acute deep vein thrombosis (DVT) of right lower extremity, unspecified vein ([place])   59yo male with history of HIV, depression, DM, neuropathy, CKD, hx of superficial thrombophlebitis and PE no longer on anticoagulation presents with concern for right lower extremity swelling.  Patient denies any trauma or pain to the leg, and have low suspicion for fracture or ligamentous injury. He has strong pulses bilaterally and have low suspicion for arterial thrombosis. There is no sign of cellulitis. DVT ultrasound was ordered which showed a DVT. Patient reports fall 10 days ago with head impact. CT Head ordered showing no sign of acute intracranial bleed.  Ordered xarelto rx for DVT and recommended close PCP follow up. Patient discharged in stable condition with understanding of reasons to return.   Gareth Morgan, MD 02/10/15 0200

## 2015-02-09 NOTE — ED Notes (Signed)
Per pt, states he fell down stairs 10 days ago-fractured his left wrist-saw PCP today and thinks he has a right DVT-history of the same-swelling in right calf

## 2015-02-09 NOTE — Progress Notes (Addendum)
By signing my name below, I, Rawaa Al Rifaie, attest that this documentation has been prepared under the direction and in the presence of Arlyss Queen, MD.  Leandra Kern, Medical Scribe. 02/09/2015.  12:15 PM.  Chief Complaint:  Chief Complaint  Patient presents with  . Follow-up    Insulin  . Fall    right side pain, x 10 days ago    HPI: Daniel [name] is a 59 y.o. male who reports to [facility] today for a fall that was sustained 10 days ago. Fall: Pt states that he fell down the steps as he was climbing up with a vacuum cleaner. Pt reports that he hit the back of his head, and sustained a left wrist injury.  He also notes experiencing severe dizziness at any time that he gets up, and indicates that he has to wait for about 5 min for him to reach equilibrium.   Possible blood clot: Pt indicates that he experiences an episode about a month ago where he believes a blood clot was moving down his upper thigh area. He notes increased shortness of breath at the time of the episode. He reports the area to be tender to palpation now, and has an associated swelling of the area, first noticed after his fall. He also notes a decreased sensation in his right leg versus the left. He has been taking blood thinners for about 10 years now. Pt reports history of PE. He denies chest pain, or current shortness of breath that is different than baseline.   Diabetes: Pt has a history of DM. He is compliant with taking 30 units Insulin. He states that his blood sugar has been ranging in the 130's. He denies experiencing symptoms of hypoglycemia. Pt will follow up with nephrology in Jan 17.    Past Medical History  Diagnosis Date  . Diabetes mellitus without complication ([place])   . ADHD (attention deficit hyperactivity disorder)   . Depression   . GERD (gastroesophageal reflux disease)   . HIV infection ([place])   . Ulcer   . Anxiety   . Clotting disorder [facility])    Past Surgical History  Procedure  Laterality Date  . Small intestine surgery     Social History   Social History  . Marital Status: Single    Spouse Name: N/A  . Number of Children: N/A  . Years of Education: N/A   Social History Main Topics  . Smoking status: Former Smoker -- 0.10 packs/day for 10 years    Types: Cigars, Cigarettes  . Smokeless tobacco: Never Used  . Alcohol Use: No  . Drug Use: No  . Sexual Activity:    Partners: Male     Comment: pt. declined condoms   Other Topics Concern  . None   Social History Narrative   Family History  Problem Relation Age of Onset  . Cancer Brother   . COPD Mother    Allergies  Allergen Reactions  . Sustiva [Efavirenz] Rash   Prior to Admission medications   Medication Sig Start Date End Date Taking? Authorizing Provider  amphetamine-dextroamphetamine (ADDERALL) 30 MG tablet Take 30 mg by mouth 3 (three) times daily.    Yes Historical Provider, MD  levothyroxine (SYNTHROID, LEVOTHROID) 50 MCG tablet Take 75 mcg by mouth daily before breakfast.    Yes Historical Provider, MD  pregabalin (LYRICA) 75 MG capsule Take 1 capsule (75 mg total) by mouth 3 (three) times daily. 09/27/14  Yes [name],  MD  prochlorperazine (COMPAZINE) 10 MG tablet TAKE ONE TABLET BY MOUTH EVERY 6 HOURS AS NEEDED. 11/23/14  Yes Thayer Headings, MD  Ranitidine HCl (RANITIDINE 75 PO) Take by mouth.   Yes Historical Provider, MD  zolpidem (AMBIEN) 10 MG tablet Take 10 mg by mouth at bedtime as needed (take 1-2 QHS).    Yes Historical Provider, MD  abacavir-dolutegravir-lamiVUDine (TRIUMEQ) 600-50-300 MG tablet Take 1 tablet by mouth daily. 02/02/15   Thayer Headings, MD  ALPRAZolam Duanne Moron) 1 MG tablet Take 1 mg by mouth as needed.    Historical Provider, MD  amoxicillin-clavulanate (AUGMENTIN) 875-125 MG per tablet Take 1 tablet by mouth 2 (two) times daily. Patient not taking: Reported on 01/10/2015 08/09/14   [name], MD  atorvastatin (LIPITOR) 10 MG tablet TAKE ONE TABLET BY MOUTH  ONCE DAILY. 01/12/15   Tishira R Brewington, PA-C  Blood Glucose Monitoring Suppl (BLOOD GLUCOSE METER KIT AND SUPPLIES) Dispense based on patient and insurance preference. Use up to four times daily as directed. (FOR ICD-9 250.00, 250.01). 09/08/13   [name], MD  cephALEXin (KEFLEX) 500 MG capsule Take 1 capsule (500 mg total) by mouth 2 (two) times daily. 11/29/14   [name], MD  clindamycin (CLEOCIN) 150 MG capsule Take by mouth 3 (three) times daily.    Historical Provider, MD  diclofenac sodium (VOLTAREN) 1 % GEL Apply 2 g topically 3 (three) times daily. Apply 2 gr each knee in the am 1 gr to each knee in the afternoon and 2 gr to each knee at bedtime. 08/01/14   [name], MD  glucose blood test strip Test blood sugar daily. Dx E11.9 01/10/15   Tishira R Brewington, PA-C  HYDROcodone-acetaminophen (NORCO/VICODIN) 5-325 MG tablet Take 1 tablet by mouth every 6 (six) hours as needed for moderate pain.    Historical Provider, MD  Insulin Glargine (LANTUS SOLOSTAR) 100 UNIT/ML Solostar Pen Inject 20 Units into the skin at bedtime. After 1 week increase insulin by 2 units. 01/10/15   Tishira R Brewington, PA-C  mupirocin ointment (BACTROBAN) 2 % Place 1 application into the nose 2 (two) times daily. Apply small amount to the toe twice a day 08/09/14   [name], MD  UNABLE TO FIND CPAP MACHINE with standard Aclaim nasal mask with humidifier. Set at 14 cwp 04/12/13   [name], MD  Vortioxetine HBr (BRINTELLIX) 20 MG TABS Take 20 mg by mouth at bedtime.    Historical Provider, MD  zoster vaccine live, PF, (ZOSTAVAX) 67591 UNT/0.65ML injection Inject 19,400 Units into the skin once. Patient not taking: Reported on 01/10/2015 08/01/14   [name], MD     ROS: The patient has leg swelling, weakness of the legs, dizziness, myalgia, arthralgia.  Patient denies chest pain, shortness of breath.     All other systems have been reviewed and were otherwise negative with the exception of  those mentioned in the HPI and as above.    PHYSICAL EXAM: Filed Vitals:   02/09/15 1139  BP: 132/80  Pulse: 90  Temp: 98.3 F (36.8 C)  Resp: 18   Body mass index is 30.69 kg/(m^2).   General: Alert, no acute distress HEENT:  Normocephalic, atraumatic, oropharynx patent. Eye: Juliette Mangle [facility] Cardiovascular:  Regular rate and rhythm, no rubs murmurs or gallops.  No Carotid bruits, radial pulse intact. No pedal edema.  Respiratory: Clear to auscultation bilaterally.  No wheezes, rales, or rhonchi.  No cyanosis, no use  of accessory musculature Abdominal: No organomegaly, abdomen is soft and non-tender, positive bowel sounds.  No masses. Musculoskeletal: Gait intact.Questionable cord present in the right inguinal area. Right thigh appears larger than the left. Measurement of the calves at 5.5 inches over the inferior border of th patella, shows right calf 1 in greater than left.  Skin: No rashes. Neurologic: Facial musculature symmetric. Psychiatric: Patient acts appropriately throughout our interaction. Lymphatic: No cervical or submandibular lymphadenopathy   LABS:  Results for orders placed or performed in visit on 02/09/15  POCT glucose (manual entry)  Result Value Ref Range   POC Glucose 206 (A) 70 - 99 mg/dl  POCT glycosylated hemoglobin (Hb A1C)  Result Value Ref Range   Hemoglobin A1C 8.3     EKG/XRAY:   . There is a suspected fracture of the ulnar styloid please comment.   ASSESSMENT/PLAN: Hemoglobin A1c has decreased to 8.3. His last hemoglobin A1c 1 month ago was 10.5. His sugar today is 206. I am very concerned on exam he has a clot in his right femoral vein. He has significant swelling of the right thigh and calf. He has a history of DVT and pulmonary emboli in the distant past but has not been on blood thinners for many years. He will be sent to the emergency room at [facility] for evaluation of probable DVT of the right leg. He also has a what appears to be a  fracture of the left ulnar styloid referral made to orthopedics for this. I also placed an order for him to see [name] his ID specialist for many of his HIV disease. I personally performed the services described in this documentation, which was scribed in my presence. The recorded information has been reviewed and is accurate. Patient took a significant blow to the top of his head 10 days ago. He has a fuzzy sensation in his head. I certainly would like to be sure a CT head is normal before he starts anticoagulation if he does have a CNS injury.    Gross sideeffects, risk and benefits, and alternatives of medications d/w patient. Patient is aware that all medications have potential sideeffects and we are unable to predict every sideeffect or drug-drug interaction that may occur.  Arlyss Queen MD 02/09/2015 12:00 PM

## 2015-02-09 NOTE — ED Notes (Signed)
Placed on monitor.

## 2015-02-09 NOTE — ED Notes (Signed)
Ultrasound at bedside

## 2015-02-09 NOTE — Patient Instructions (Addendum)
Please go to [facility]. Check in to be seen as a patient in the emergency room. I am concerned about a probable clot in the right femoral vein with resultant right thigh and calf swelling. Patient also states she feels fuzzy in his head. He had significant trauma to the back of his head 10 days ago but no loss of consciousness. I do think it would be prudent to CT .

## 2015-02-09 NOTE — ED Notes (Signed)
Patient transported to CT 

## 2015-02-09 NOTE — Progress Notes (Addendum)
Preliminary results by tech - Venous Duplex Lower Ext. Completed. Positive for acute acute deep vein thrombosis involving the right  mid and distal femoral vein only.  Left Leg - No evidence of deep or superficial vein thrombosis.  Results given to patient's nurse. Oda Cogan, BS, RDMS, RVT

## 2015-02-09 NOTE — ED Notes (Signed)
Pt states he has been dizzy and would need a CT of his head. EDP to come and discuss with patient.

## 2015-02-13 ENCOUNTER — Other Ambulatory Visit: Payer: 59

## 2015-02-14 ENCOUNTER — Ambulatory Visit (INDEPENDENT_AMBULATORY_CARE_PROVIDER_SITE_OTHER): Payer: 59 | Admitting: Emergency Medicine

## 2015-02-14 VITALS — BP 128/80 | HR 91 | Temp 98.2°F | Resp 18 | Ht 70.5 in | Wt 220.4 lb

## 2015-02-14 DIAGNOSIS — E785 Hyperlipidemia, unspecified: Secondary | ICD-10-CM | POA: Diagnosis not present

## 2015-02-14 DIAGNOSIS — B20 Human immunodeficiency virus [HIV] disease: Secondary | ICD-10-CM

## 2015-02-14 DIAGNOSIS — S62102A Fracture of unspecified carpal bone, left wrist, initial encounter for closed fracture: Secondary | ICD-10-CM | POA: Diagnosis not present

## 2015-02-14 DIAGNOSIS — I82409 Acute embolism and thrombosis of unspecified deep veins of unspecified lower extremity: Secondary | ICD-10-CM | POA: Diagnosis not present

## 2015-02-14 DIAGNOSIS — E0849 Diabetes mellitus due to underlying condition with other diabetic neurological complication: Secondary | ICD-10-CM | POA: Diagnosis not present

## 2015-02-14 DIAGNOSIS — H8110 Benign paroxysmal vertigo, unspecified ear: Secondary | ICD-10-CM

## 2015-02-14 LAB — GLUCOSE, POCT (MANUAL RESULT ENTRY): POC GLUCOSE: 181 mg/dL — AB (ref 70–99)

## 2015-02-14 MED ORDER — MECLIZINE HCL 25 MG PO TABS: ORAL_TABLET | ORAL | Status: DC

## 2015-02-14 MED ORDER — OXYCODONE HCL 5 MG PO TABS: 2.5000 mg | ORAL_TABLET | Freq: Four times a day (QID) | ORAL | Status: DC | PRN

## 2015-02-14 NOTE — Progress Notes (Addendum)
Patient ID: Daniel [name], male   DOB: 03-Aug-1955, 59 y.o.   MRN: [id number]    By signing my name below, I, [name], attest that this documentation has been prepared under the direction and in the presence of [name], MD Electronically Signed: Ladene Artist, ED Scribe 02/14/2015 at 3:15 PM.  Chief Complaint:  Chief Complaint  Patient presents with  . Follow-up   HPI: Daniel [name] is a 59 y.o. male, with a h/o DM and clotting disorder, who reports to [facility] today for a follow-up regarding dizziness. Pt had CT head done on 02/09/15; normal. He states that he is still experiencing intermittent dizziness, worsened with lying down.  DVT Pt was sent to the ED from the office on 02/09/15 for right leg swelling and pain. Venous doppler showed a DVT in the right lower extremity and he was placed back on Xarelto 15 and 20 mg.   Diabetes  Pt checks his blood glucose regularly with average readings of 130-140s. Hgb A1C on 01/10/15 was 10.5 and 8.3 on 02/09/15.   Past Medical History  Diagnosis Date  . Diabetes mellitus without complication ([place])   . ADHD (attention deficit hyperactivity disorder)   . Depression   . GERD (gastroesophageal reflux disease)   . HIV infection ([place])   . Ulcer   . Anxiety   . Clotting disorder [facility])    Past Surgical History  Procedure Laterality Date  . Small intestine surgery     Social History   Social History  . Marital Status: Single    Spouse Name: N/A  . Number of Children: N/A  . Years of Education: N/A   Social History Main Topics  . Smoking status: Former Smoker -- 0.10 packs/day for 10 years    Types: Cigars, Cigarettes  . Smokeless tobacco: Never Used  . Alcohol Use: No  . Drug Use: No  . Sexual Activity:    Partners: Male     Comment: pt. declined condoms   Other Topics Concern  . None   Social History Narrative   Family History  Problem Relation Age of Onset  . Cancer Brother   . COPD Mother    Allergies    Allergen Reactions  . Sustiva [Efavirenz] Rash   Prior to Admission medications   Medication Sig Start Date End Date Taking? Authorizing Provider  abacavir-dolutegravir-lamiVUDine (TRIUMEQ) 600-50-300 MG tablet Take 1 tablet by mouth daily. 02/02/15  Yes Thayer Headings, MD  ALPRAZolam Duanne Moron) 1 MG tablet Take 1 mg by mouth 3 (three) times daily as needed for anxiety.    Yes Historical Provider, MD  amphetamine-dextroamphetamine (ADDERALL) 30 MG tablet Take 30 mg by mouth 3 (three) times daily.    Yes Historical Provider, MD  atorvastatin (LIPITOR) 10 MG tablet TAKE ONE TABLET BY MOUTH ONCE DAILY. 01/12/15  Yes Tishira R Brewington, PA-C  Blood Glucose Monitoring Suppl (BLOOD GLUCOSE METER KIT AND SUPPLIES) Dispense based on patient and insurance preference. Use up to four times daily as directed. (FOR ICD-9 250.00, 250.01). 09/08/13  Yes [name], MD  diclofenac sodium (VOLTAREN) 1 % GEL Apply 2 g topically 3 (three) times daily. Apply 2 gr each knee in the am 1 gr to each knee in the afternoon and 2 gr to each knee at bedtime. 08/01/14  Yes [name], MD  glucose blood test strip Test blood sugar daily. Dx E11.9 01/10/15  Yes Tishira R Brewington, PA-C  Insulin Glargine (LANTUS SOLOSTAR) 100 UNIT/ML  Solostar Pen Inject 30 units at bedtime 02/09/15  Yes [name], MD  levothyroxine (SYNTHROID, LEVOTHROID) 50 MCG tablet Take 75 mcg by mouth daily before breakfast.    Yes Historical Provider, MD  oxyCODONE (OXY IR/ROXICODONE) 5 MG immediate release tablet Take 0.5 tablets by mouth every 6 (six) hours as needed. Pain. 01/10/15  Yes Historical Provider, MD  pregabalin (LYRICA) 75 MG capsule Take 1 capsule (75 mg total) by mouth 3 (three) times daily. 09/27/14  Yes [name], MD  prochlorperazine (COMPAZINE) 10 MG tablet TAKE ONE TABLET BY MOUTH EVERY 6 HOURS AS NEEDED. 11/23/14  Yes Thayer Headings, MD  Ranitidine HCl (RANITIDINE 75 PO) Take 1 tablet by mouth daily as needed (heartburn).     Yes Historical Provider, MD  TRINTELLIX 20 MG TABS Take 20 mg by mouth at bedtime. 01/26/15  Yes Historical Provider, MD  UNABLE TO FIND CPAP MACHINE with standard Aclaim nasal mask with humidifier. Set at 14 cwp 04/12/13  Yes [name], MD  Vortioxetine HBr (BRINTELLIX) 20 MG TABS Take 20 mg by mouth at bedtime.   Yes Historical Provider, MD  XARELTO STARTER PACK 15 & 20 MG TBPK Take 15-20 mg by mouth as directed. Take as directed on package: Start with one 32m tablet by mouth twice a day with food. On Day 22, switch to one 25mtablet once a day with food. 02/09/15  Yes ErGareth MorganMD  zolpidem (AMBIEN) 10 MG tablet Take 10-20 mg by mouth at bedtime as needed for sleep.    Yes Historical Provider, MD  amoxicillin-clavulanate (AUGMENTIN) 875-125 MG per tablet Take 1 tablet by mouth 2 (two) times daily. Patient not taking: Reported on 01/10/2015 08/09/14   StDarlyne RussianMD  cephALEXin (KEFLEX) 500 MG capsule Take 1 capsule (500 mg total) by mouth 2 (two) times daily. Patient not taking: Reported on 02/09/2015 11/29/14   StDarlyne RussianMD  mupirocin ointment (BACTROBAN) 2 % Place 1 application into the nose 2 (two) times daily. Apply small amount to the toe twice a day Patient not taking: Reported on 02/09/2015 08/09/14   StDarlyne RussianMD  zoster vaccine live, PF, (ZOSTAVAX) 1902637NT/0.65ML injection Inject 19,400 Units into the skin once. Patient not taking: Reported on 01/10/2015 08/01/14   StDarlyne RussianMD   ROS: The patient denies fevers, chills, night sweats, unintentional weight loss, chest pain, palpitations, wheezing, dyspnea on exertion, nausea, vomiting, abdominal pain, dysuria, hematuria, melena, numbness, weakness, or tingling. +dizziness  All other systems have been reviewed and were otherwise negative with the exception of those mentioned in the HPI and as above.    PHYSICAL EXAM: Filed Vitals:   02/14/15 1411  BP: 128/80  Pulse: 91  Temp: 98.2 F (36.8 C)  Resp: 18    Body mass index is 31.17 kg/(m^2).  General: Alert, no acute distress HEENT:  Normocephalic, atraumatic, oropharynx patent. [facility]EM[facility]:  Regular rate and rhythm, no rubs murmurs or gallops. No Carotid bruits, radial pulse intact. No pedal edema.  Respiratory: Clear to auscultation bilaterally. No wheezes, rales, or rhonchi. No cyanosis, no use of accessory musculature Abdominal: No organomegaly, abdomen is soft and non-tender, positive bowel sounds. No masses. Musculoskeletal: Gait intact. No edema. Swelling and tenderness over the ulnar styloid on the L.  Skin: No rashes. Neurologic: Facial musculature symmetric. Psychiatric: Patient acts appropriately throughout our interaction. Lymphatic: No cervical or submandibular lymphadenopathy  LABS: Results for orders placed or performed in  visit on 02/14/15  POCT glucose (manual entry)  Result Value Ref Range   POC Glucose 181 (A) 70 - 99 mg/dl   EKG/XRAY:   Primary read interpreted by Dr. Everlene Farrier at [facility].  ASSESSMENT/PLAN: Patient given limited number of oxycodone for pain. He will continue his wrist splint. Referral made to orthopedics. He was given Antivert for dizziness. Referral made to cardiology for their evaluation due to multiple risk factors coronary disease EKG done in the ER was normal..I personally performed the services described in this documentation, which was scribed in my presence. The recorded information has been reviewed and is accurate.    Gross sideeffects, risk and benefits, and alternatives of medications d/w patient. Patient is aware that all medications have potential sideeffects and we are unable to predict every sideeffect or drug-drug interaction that may occur.  Arlyss Queen MD 02/14/2015 3:02 PM

## 2015-02-14 NOTE — Patient Instructions (Signed)
i have made you a referral to cardiology for evaluation. Your diabetes is under better control. I have made you a referral to orthopedics.

## 2015-02-20 ENCOUNTER — Other Ambulatory Visit: Payer: Self-pay | Admitting: *Deleted

## 2015-02-20 MED ORDER — AMOXICILLIN-POT CLAVULANATE 875-125 MG PO TABS: 1.0000 | ORAL_TABLET | Freq: Two times a day (BID) | ORAL | Status: DC

## 2015-02-21 ENCOUNTER — Telehealth: Payer: Self-pay | Admitting: Emergency Medicine

## 2015-02-21 ENCOUNTER — Encounter (HOSPITAL_COMMUNITY): Payer: Self-pay

## 2015-02-21 ENCOUNTER — Observation Stay (HOSPITAL_COMMUNITY)
Admission: EM | Admit: 2015-02-21 | Discharge: 2015-02-22 | Disposition: A | Discharge status: Home / Self Care | Payer: 59 | Attending: Internal Medicine | Admitting: Internal Medicine

## 2015-02-21 ENCOUNTER — Emergency Department (HOSPITAL_COMMUNITY): Payer: 59

## 2015-02-21 DIAGNOSIS — X58XXXA Exposure to other specified factors, initial encounter: Secondary | ICD-10-CM | POA: Diagnosis not present

## 2015-02-21 DIAGNOSIS — Y999 Unspecified external cause status: Secondary | ICD-10-CM | POA: Diagnosis not present

## 2015-02-21 DIAGNOSIS — F419 Anxiety disorder, unspecified: Secondary | ICD-10-CM | POA: Diagnosis not present

## 2015-02-21 DIAGNOSIS — F909 Attention-deficit hyperactivity disorder, unspecified type: Secondary | ICD-10-CM | POA: Insufficient documentation

## 2015-02-21 DIAGNOSIS — S30811A Abrasion of abdominal wall, initial encounter: Secondary | ICD-10-CM | POA: Insufficient documentation

## 2015-02-21 DIAGNOSIS — B349 Viral infection, unspecified: Principal | ICD-10-CM | POA: Insufficient documentation

## 2015-02-21 DIAGNOSIS — N189 Chronic kidney disease, unspecified: Secondary | ICD-10-CM

## 2015-02-21 DIAGNOSIS — K219 Gastro-esophageal reflux disease without esophagitis: Secondary | ICD-10-CM | POA: Diagnosis not present

## 2015-02-21 DIAGNOSIS — E0849 Diabetes mellitus due to underlying condition with other diabetic neurological complication: Secondary | ICD-10-CM

## 2015-02-21 DIAGNOSIS — D689 Coagulation defect, unspecified: Secondary | ICD-10-CM | POA: Insufficient documentation

## 2015-02-21 DIAGNOSIS — Z79899 Other long term (current) drug therapy: Secondary | ICD-10-CM | POA: Insufficient documentation

## 2015-02-21 DIAGNOSIS — F329 Major depressive disorder, single episode, unspecified: Secondary | ICD-10-CM | POA: Diagnosis not present

## 2015-02-21 DIAGNOSIS — Z794 Long term (current) use of insulin: Secondary | ICD-10-CM

## 2015-02-21 DIAGNOSIS — R531 Weakness: Secondary | ICD-10-CM | POA: Diagnosis not present

## 2015-02-21 DIAGNOSIS — Y929 Unspecified place or not applicable: Secondary | ICD-10-CM | POA: Insufficient documentation

## 2015-02-21 DIAGNOSIS — E119 Type 2 diabetes mellitus without complications: Secondary | ICD-10-CM | POA: Diagnosis not present

## 2015-02-21 DIAGNOSIS — N183 Chronic kidney disease, stage 3 unspecified: Secondary | ICD-10-CM | POA: Diagnosis present

## 2015-02-21 DIAGNOSIS — R52 Pain, unspecified: Secondary | ICD-10-CM | POA: Diagnosis present

## 2015-02-21 DIAGNOSIS — Z21 Asymptomatic human immunodeficiency virus [HIV] infection status: Secondary | ICD-10-CM | POA: Insufficient documentation

## 2015-02-21 DIAGNOSIS — B20 Human immunodeficiency virus [HIV] disease: Secondary | ICD-10-CM | POA: Diagnosis present

## 2015-02-21 DIAGNOSIS — S80212A Abrasion, left knee, initial encounter: Secondary | ICD-10-CM | POA: Diagnosis not present

## 2015-02-21 DIAGNOSIS — S80211A Abrasion, right knee, initial encounter: Secondary | ICD-10-CM | POA: Diagnosis not present

## 2015-02-21 DIAGNOSIS — Z87891 Personal history of nicotine dependence: Secondary | ICD-10-CM | POA: Diagnosis not present

## 2015-02-21 DIAGNOSIS — Y939 Activity, unspecified: Secondary | ICD-10-CM | POA: Diagnosis not present

## 2015-02-21 LAB — CBC WITH DIFFERENTIAL/PLATELET
BASOS ABS: 0 10*3/uL (ref 0.0–0.1)
BASOS PCT: 0 %
EOS PCT: 0 %
Eosinophils Absolute: 0 10*3/uL (ref 0.0–0.7)
HEMATOCRIT: 45 % (ref 39.0–52.0)
Hemoglobin: 15.6 g/dL (ref 13.0–17.0)
LYMPHS PCT: 19 %
Lymphs Abs: 1.4 10*3/uL (ref 0.7–4.0)
MCH: 32.5 pg (ref 26.0–34.0)
MCHC: 34.7 g/dL (ref 30.0–36.0)
MCV: 93.8 fL (ref 78.0–100.0)
Monocytes Absolute: 0.5 10*3/uL (ref 0.1–1.0)
Monocytes Relative: 7 %
NEUTROS ABS: 5.2 10*3/uL (ref 1.7–7.7)
Neutrophils Relative %: 74 %
PLATELETS: 102 10*3/uL — AB (ref 150–400)
RBC: 4.8 MIL/uL (ref 4.22–5.81)
RDW: 13.7 % (ref 11.5–15.5)
WBC: 7.1 10*3/uL (ref 4.0–10.5)

## 2015-02-21 LAB — COMPREHENSIVE METABOLIC PANEL
ALBUMIN: 4 g/dL (ref 3.5–5.0)
ALT: 17 U/L (ref 17–63)
AST: 19 U/L (ref 15–41)
Alkaline Phosphatase: 122 U/L (ref 38–126)
Anion gap: 9 (ref 5–15)
BUN: 16 mg/dL (ref 6–20)
CHLORIDE: 100 mmol/L — AB (ref 101–111)
CO2: 24 mmol/L (ref 22–32)
CREATININE: 1.71 mg/dL — AB (ref 0.61–1.24)
Calcium: 9.1 mg/dL (ref 8.9–10.3)
GFR calc Af Amer: 48 mL/min — ABNORMAL LOW (ref >60)
GFR, EST NON AFRICAN AMERICAN: 41 mL/min — AB (ref >60)
GLUCOSE: 221 mg/dL — AB (ref 65–99)
Potassium: 3.6 mmol/L (ref 3.5–5.1)
Sodium: 133 mmol/L — ABNORMAL LOW (ref 135–145)
Total Bilirubin: 1.4 mg/dL — ABNORMAL HIGH (ref 0.3–1.2)
Total Protein: 7.7 g/dL (ref 6.5–8.1)

## 2015-02-21 LAB — URINALYSIS, ROUTINE W REFLEX MICROSCOPIC
Bilirubin Urine: NEGATIVE
GLUCOSE, UA: 500 mg/dL — AB
Ketones, ur: NEGATIVE mg/dL
LEUKOCYTES UA: NEGATIVE
Nitrite: NEGATIVE
PH: 6.5 (ref 5.0–8.0)
PROTEIN: 100 mg/dL — AB
SPECIFIC GRAVITY, URINE: 1.025 (ref 1.005–1.030)

## 2015-02-21 LAB — PROTIME-INR
INR: 1.36 (ref 0.00–1.49)
Prothrombin Time: 16.9 seconds — ABNORMAL HIGH (ref 11.6–15.2)

## 2015-02-21 LAB — URINE MICROSCOPIC-ADD ON
BACTERIA UA: NONE SEEN
RBC / HPF: NONE SEEN RBC/hpf (ref 0–5)

## 2015-02-21 LAB — LIPASE, BLOOD: LIPASE: 15 U/L (ref 11–51)

## 2015-02-21 LAB — CBG MONITORING, ED
GLUCOSE-CAPILLARY: 153 mg/dL — AB (ref 65–99)
Glucose-Capillary: 127 mg/dL — ABNORMAL HIGH (ref 65–99)

## 2015-02-21 LAB — I-STAT CG4 LACTIC ACID, ED: Lactic Acid, Venous: 1.29 mmol/L (ref 0.5–2.0)

## 2015-02-21 MED ORDER — LAMIVUDINE 150 MG PO TABS
300.0000 mg | ORAL_TABLET | Freq: Every day | ORAL | Status: DC
  Administered 2015-02-21 – 2015-02-22 (×2): 300 mg via ORAL
  Filled 2015-02-21 (×3): qty 2

## 2015-02-21 MED ORDER — SODIUM CHLORIDE 0.9 % IV BOLUS (SEPSIS)
1000.0000 mL | Freq: Once | INTRAVENOUS | Status: AC
  Administered 2015-02-21: 1000 mL via INTRAVENOUS

## 2015-02-21 MED ORDER — RIVAROXABAN (XARELTO) VTE STARTER PACK (15 & 20 MG): 15.0000 mg | ORAL_TABLET | ORAL | Status: DC

## 2015-02-21 MED ORDER — AMPHETAMINE-DEXTROAMPHETAMINE 10 MG PO TABS
30.0000 mg | ORAL_TABLET | Freq: Three times a day (TID) | ORAL | Status: DC
  Filled 2015-02-21: qty 3

## 2015-02-21 MED ORDER — PREGABALIN 75 MG PO CAPS
75.0000 mg | ORAL_CAPSULE | Freq: Three times a day (TID) | ORAL | Status: DC
  Administered 2015-02-21 – 2015-02-22 (×3): 75 mg via ORAL
  Filled 2015-02-21 (×3): qty 1

## 2015-02-21 MED ORDER — SODIUM CHLORIDE 0.9 % IV SOLN
INTRAVENOUS | Status: DC
  Administered 2015-02-21 – 2015-02-22 (×2): via INTRAVENOUS

## 2015-02-21 MED ORDER — LEVOTHYROXINE SODIUM 75 MCG PO TABS
75.0000 ug | ORAL_TABLET | Freq: Every day | ORAL | Status: DC
  Administered 2015-02-21: 75 ug via ORAL
  Filled 2015-02-21: qty 1

## 2015-02-21 MED ORDER — ZOLPIDEM TARTRATE 5 MG PO TABS: 10.0000 mg | ORAL_TABLET | Freq: Every evening | ORAL | Status: DC | PRN

## 2015-02-21 MED ORDER — VORTIOXETINE HBR 20 MG PO TABS: 20.0000 mg | ORAL_TABLET | Freq: Every day | ORAL | Status: DC

## 2015-02-21 MED ORDER — DICLOFENAC SODIUM 1 % TD GEL
2.0000 g | Freq: Three times a day (TID) | TRANSDERMAL | Status: DC
  Administered 2015-02-21 – 2015-02-22 (×3): 2 g via TOPICAL
  Filled 2015-02-21: qty 100

## 2015-02-21 MED ORDER — ZOLPIDEM TARTRATE 5 MG PO TABS
10.0000 mg | ORAL_TABLET | Freq: Every evening | ORAL | Status: DC | PRN
  Administered 2015-02-21: 10 mg via ORAL
  Filled 2015-02-21: qty 2

## 2015-02-21 MED ORDER — ONDANSETRON HCL 4 MG PO TABS
4.0000 mg | ORAL_TABLET | Freq: Four times a day (QID) | ORAL | Status: DC | PRN
  Administered 2015-02-21: 4 mg via ORAL
  Filled 2015-02-21: qty 1

## 2015-02-21 MED ORDER — OXYCODONE HCL 5 MG PO TABS
2.5000 mg | ORAL_TABLET | Freq: Four times a day (QID) | ORAL | Status: DC | PRN
  Administered 2015-02-21: 2.5 mg via ORAL
  Filled 2015-02-21: qty 1

## 2015-02-21 MED ORDER — ABACAVIR SULFATE 300 MG PO TABS
600.0000 mg | ORAL_TABLET | Freq: Every day | ORAL | Status: DC
  Administered 2015-02-21 – 2015-02-22 (×2): 600 mg via ORAL
  Filled 2015-02-21 (×3): qty 2

## 2015-02-21 MED ORDER — FAMOTIDINE 20 MG PO TABS
10.0000 mg | ORAL_TABLET | Freq: Every day | ORAL | Status: DC
  Administered 2015-02-21 – 2015-02-22 (×2): 10 mg via ORAL
  Filled 2015-02-21 (×2): qty 1

## 2015-02-21 MED ORDER — INSULIN ASPART 100 UNIT/ML ~~LOC~~ SOLN: 0.0000 [IU] | Freq: Every day | SUBCUTANEOUS | Status: DC

## 2015-02-21 MED ORDER — VORTIOXETINE HBR 10 MG PO TABS
20.0000 mg | ORAL_TABLET | Freq: Every day | ORAL | Status: DC
  Administered 2015-02-21: 20 mg via ORAL
  Filled 2015-02-21 (×2): qty 2

## 2015-02-21 MED ORDER — INSULIN ASPART 100 UNIT/ML ~~LOC~~ SOLN
0.0000 [IU] | Freq: Three times a day (TID) | SUBCUTANEOUS | Status: DC
  Administered 2015-02-21: 3 [IU] via SUBCUTANEOUS
  Administered 2015-02-22: 2 [IU] via SUBCUTANEOUS
  Filled 2015-02-21: qty 1

## 2015-02-21 MED ORDER — AMPHETAMINE-DEXTROAMPHETAMINE 10 MG PO TABS
30.0000 mg | ORAL_TABLET | Freq: Three times a day (TID) | ORAL | Status: DC
  Administered 2015-02-22 (×2): 30 mg via ORAL
  Filled 2015-02-21 (×2): qty 3

## 2015-02-21 MED ORDER — RIVAROXABAN 15 MG PO TABS
15.0000 mg | ORAL_TABLET | Freq: Two times a day (BID) | ORAL | Status: DC
  Administered 2015-02-21 – 2015-02-22 (×2): 15 mg via ORAL
  Filled 2015-02-21 (×5): qty 1

## 2015-02-21 MED ORDER — ABACAVIR-DOLUTEGRAVIR-LAMIVUD 600-50-300 MG PO TABS: 1.0000 | ORAL_TABLET | Freq: Every day | ORAL | Status: DC

## 2015-02-21 MED ORDER — ONDANSETRON HCL 4 MG/2ML IJ SOLN: 4.0000 mg | Freq: Four times a day (QID) | INTRAMUSCULAR | Status: DC | PRN

## 2015-02-21 MED ORDER — DOLUTEGRAVIR SODIUM 50 MG PO TABS
50.0000 mg | ORAL_TABLET | Freq: Every day | ORAL | Status: DC
  Administered 2015-02-21 – 2015-02-22 (×2): 50 mg via ORAL
  Filled 2015-02-21 (×3): qty 1

## 2015-02-21 MED ORDER — INSULIN GLARGINE 100 UNIT/ML SOLOSTAR PEN: 30.0000 [IU] | PEN_INJECTOR | Freq: Every day | SUBCUTANEOUS | Status: DC

## 2015-02-21 MED ORDER — ALPRAZOLAM 1 MG PO TABS
1.0000 mg | ORAL_TABLET | Freq: Three times a day (TID) | ORAL | Status: DC | PRN
  Administered 2015-02-21 – 2015-02-22 (×2): 1 mg via ORAL
  Filled 2015-02-21 (×2): qty 1

## 2015-02-21 MED ORDER — ATORVASTATIN CALCIUM 10 MG PO TABS
10.0000 mg | ORAL_TABLET | Freq: Every day | ORAL | Status: DC
  Administered 2015-02-21: 10 mg via ORAL
  Filled 2015-02-21 (×2): qty 1

## 2015-02-21 MED ORDER — INSULIN GLARGINE 100 UNIT/ML ~~LOC~~ SOLN
30.0000 [IU] | Freq: Every day | SUBCUTANEOUS | Status: DC
  Administered 2015-02-21: 30 [IU] via SUBCUTANEOUS
  Filled 2015-02-21 (×2): qty 0.3

## 2015-02-21 MED ORDER — SODIUM CHLORIDE 0.9 % IV SOLN: INTRAVENOUS | Status: DC

## 2015-02-21 NOTE — H&P (Signed)
[facility] History and Physical  Daniel [name] [facility] JQZ:[id number] DOB: 01-06-1956 DOA: 02/21/2015  Referring physician: ER PCP: Jenny Reichmann, MD   Chief Complaint: Weakness  HPI: Daniel [name] is a 59 y.o. male  This is a 59 year old man who came to the hospital because of weakness. He was unable to get up by himself. He also has had productive cough with thick green sputum, rhinorrhea. He also has had diarrhea about 2 weeks ago but says he has not had any recently. The patient was started on amoxicillin by his PCP for flulike symptoms. The patient denies fever, chills, nausea or vomiting. There is no headache. There is no chest pain or dyspnea. The patient was diagnosed with leg DVT approximately a couple weeks ago and is on anticoagulation. He also has HIV disease for the last 10-15 years but his most recent CD count was 800. He is on antiretroviral therapy. He is diabetic.   Review of Systems:  Apart from the symptoms above, all systems are negative.  Past Medical History  Diagnosis Date  . Diabetes mellitus without complication ([place])   . ADHD (attention deficit hyperactivity disorder)   . Depression   . GERD (gastroesophageal reflux disease)   . HIV infection ([place])   . Ulcer   . Anxiety   . Clotting disorder [facility])    Past Surgical History  Procedure Laterality Date  . Small intestine surgery     Social History:  reports that he has quit smoking. His smoking use included Cigars and Cigarettes. He has a 1 pack-year smoking history. He has never used smokeless tobacco. He reports that he does not drink alcohol or use illicit drugs.  Allergies  Allergen Reactions  . Sustiva [Efavirenz] Rash    Family History  Problem Relation Age of Onset  . Cancer Brother   . COPD Mother       Prior to Admission medications   Medication Sig Start Date End Date Taking? Authorizing Provider  abacavir-dolutegravir-lamiVUDine (TRIUMEQ) 600-50-300 MG tablet Take 1 tablet by mouth  daily. 02/02/15  Yes Thayer Headings, MD  ALPRAZolam Duanne Moron) 1 MG tablet Take 1 mg by mouth 3 (three) times daily as needed for anxiety.    Yes Historical Provider, MD  amoxicillin-clavulanate (AUGMENTIN) 875-125 MG tablet Take 1 tablet by mouth 2 (two) times daily. 02/20/15  Yes [name], MD  amphetamine-dextroamphetamine (ADDERALL) 30 MG tablet Take 30 mg by mouth 3 (three) times daily.    Yes Historical Provider, MD  atorvastatin (LIPITOR) 10 MG tablet TAKE ONE TABLET BY MOUTH ONCE DAILY. 01/12/15  Yes Tishira R Brewington, PA-C  diclofenac sodium (VOLTAREN) 1 % GEL Apply 2 g topically 3 (three) times daily. Apply 2 gr each knee in the am 1 gr to each knee in the afternoon and 2 gr to each knee at bedtime. 08/01/14  Yes [name], MD  ibuprofen (ADVIL,MOTRIN) 200 MG tablet Take 200 mg by mouth daily as needed for moderate pain.   Yes Historical Provider, MD  Insulin Glargine (LANTUS SOLOSTAR) 100 UNIT/ML Solostar Pen Inject 30 units at bedtime 02/09/15  Yes [name], MD  levothyroxine (SYNTHROID, LEVOTHROID) 75 MCG tablet Take 75 mcg by mouth at bedtime.   Yes Historical Provider, MD  meclizine (ANTIVERT) 25 MG tablet One half tablet 3 times a day as needed for dizziness 02/14/15  Yes [name], MD  oxyCODONE (OXY IR/ROXICODONE) 5 MG immediate release tablet Take 0.5 tablets (2.5 mg total) by  mouth every 6 (six) hours as needed. Pain. 02/14/15  Yes [name], MD  pregabalin (LYRICA) 75 MG capsule Take 1 capsule (75 mg total) by mouth 3 (three) times daily. 09/27/14  Yes [name], MD  prochlorperazine (COMPAZINE) 10 MG tablet TAKE ONE TABLET BY MOUTH EVERY 6 HOURS AS NEEDED. 11/23/14  Yes Thayer Headings, MD  pseudoephedrine-acetaminophen (TYLENOL SINUS) 30-500 MG TABS tablet Take 1 tablet by mouth daily as needed (sleep).   Yes Historical Provider, MD  Ranitidine HCl (RANITIDINE 75 PO) Take 1 tablet by mouth daily as needed (heartburn).    Yes Historical Provider, MD  TRINTELLIX  20 MG TABS Take 20 mg by mouth at bedtime. 01/26/15  Yes Historical Provider, MD  Vortioxetine HBr (BRINTELLIX) 20 MG TABS Take 20 mg by mouth at bedtime.   Yes Historical Provider, MD  XARELTO STARTER PACK 15 & 20 MG TBPK Take 15-20 mg by mouth as directed. Take as directed on package: Start with one 4m tablet by mouth twice a day with food. On Day 22, switch to one 239mtablet once a day with food. 02/09/15  Yes ErGareth MorganMD  zolpidem (AMBIEN) 10 MG tablet Take 10-20 mg by mouth at bedtime as needed for sleep.    Yes Historical Provider, MD  amoxicillin-clavulanate (AUGMENTIN) 875-125 MG per tablet Take 1 tablet by mouth 2 (two) times daily. Patient not taking: Reported on 01/10/2015 08/09/14   StDarlyne RussianMD  Blood Glucose Monitoring Suppl (BLOOD GLUCOSE METER KIT AND SUPPLIES) Dispense based on patient and insurance preference. Use up to four times daily as directed. (FOR ICD-9 250.00, 250.01). 09/08/13   StDarlyne RussianMD  cephALEXin (KEFLEX) 500 MG capsule Take 1 capsule (500 mg total) by mouth 2 (two) times daily. Patient not taking: Reported on 02/09/2015 11/29/14   StDarlyne RussianMD  glucose blood test strip Test blood sugar daily. Dx E11.9 01/10/15   Tishira R Brewington, PA-C  mupirocin ointment (BACTROBAN) 2 % Place 1 application into the nose 2 (two) times daily. Apply small amount to the toe twice a day Patient not taking: Reported on 02/09/2015 08/09/14   StDarlyne RussianMD  UNABLE TO FIND CPAP MACHINE with standard Aclaim nasal mask with humidifier. Set at 14 cwp 04/12/13   StDarlyne RussianMD  zoster vaccine live, PF, (ZOSTAVAX) 1938453NT/0.65ML injection Inject 19,400 Units into the skin once. Patient not taking: Reported on 01/10/2015 08/01/14   StDarlyne RussianMD   Physical Exam: Filed Vitals:   02/21/15 1138 02/21/15 1200 02/21/15 1230 02/21/15 1500  BP: 127/74 116/70 106/68 110/62  Pulse: 110 102 99 95  Temp:   98.9 F (37.2 C)   TempSrc:   Oral   Resp: _0 Height:      Weight:      SpO2: 92% 97% 97% 96%    Wt Readings from Last 3 Encounters:  02/21/15 100.699 kg (222 lb)  02/14/15 99.973 kg (220 lb 6.4 oz)  02/09/15 98.431 kg (217 lb)    General:  Appears calm and comfortable. He is not toxic or septic clinically. Blood pressure is stable. Eyes: PERRL, normal lids, irises & conjunctiva ENT: grossly normal hearing, lips & tongue Neck: no LAD, masses or thyromegaly Cardiovascular: RRR, no m/r/g. No LE edema. Telemetry: SR, no arrhythmias  Respiratory: CTA bilaterally, no w/r/r. Normal respiratory effort. Abdomen: soft, ntnd Skin: no rash or induration seen on limited exam Musculoskeletal: grossly  normal tone BUE/BLE Psychiatric: grossly normal mood and affect, speech fluent and appropriate Neurologic: he does not appear to have any new focal neurological signs.           Labs on Admission:  Basic Metabolic Panel:  Recent Labs Lab 02/21/15 1123  NA 133*  K 3.6  CL 100*  CO2 24  GLUCOSE 221*  BUN 16  CREATININE 1.71*  CALCIUM 9.1   Liver Function Tests:  Recent Labs Lab 02/21/15 1123  AST 19  ALT 17  ALKPHOS 122  BILITOT 1.4*  PROT 7.7  ALBUMIN 4.0    Recent Labs Lab 02/21/15 1123  LIPASE 15   No results for input(s): AMMONIA in the last 168 hours. CBC:  Recent Labs Lab 02/21/15 1123  WBC 7.1  NEUTROABS 5.2  HGB 15.6  HCT 45.0  MCV 93.8  PLT 102*   Cardiac Enzymes: No results for input(s): CKTOTAL, CKMB, CKMBINDEX, TROPONINI in the last 168 hours.  BNP (last 3 results) No results for input(s): BNP in the last 8760 hours.  ProBNP (last 3 results) No results for input(s): PROBNP in the last 8760 hours.  CBG: No results for input(s): GLUCAP in the last 168 hours.  Radiological Exams on Admission: Dg Chest 2 View  02/21/2015  CLINICAL DATA:  Weakness, shortness of breath, diabetes mellitus, HIV, former smoker EXAM: CHEST  2 VIEW COMPARISON:  None FINDINGS: Borderline enlargement of  cardiac silhouette. Lordotic positioning. Mediastinal contours normal. Slight vascular congestion. Bronchitic changes without infiltrate, pleural effusion or pneumothorax. No acute osseous findings. IMPRESSION: No definite acute abnormalities. Borderline enlargement of cardiac silhouette with slight pulmonary vascular congestion. Electronically Signed   By: Lavonia Dana M.D.   On: 02/21/2015 12:20    EKG: Independently reviewed. sinus rhythm without any acute ST-T wave changes.   Assessment/Plan   1. Weakness. The etiology is unclear. He may have an element of acute on chronic renal failure. He will be given IV fluids and electrolytes will be monitored closely. I will ask physical therapy to evaluate him. 2. Diabetes. Continue with home medications and sliding scale of insulin. 3. HIV disease. Stable. 4. Recent leg DVT. Continue with anticoagulation. 5. Recent diarrhea. He says that he is not having diarrhea at the present time. Consider testing for C. difficile toxin if he has further diarrhea.   He will be admitted to the medical floor under observation. Further recommendations will depend on patient's hospital progress.    Code Status: full code.   Family Communication: I discussed the plan with the patient at the bedside.    Disposition Plan: home when medically stable.  Time spent: 45 minutes.   Doree Albee [facility] Pager [phone].

## 2015-02-21 NOTE — ED Notes (Signed)
Routine medications requested from pharmacy for 16:00.

## 2015-02-21 NOTE — ED Provider Notes (Signed)
CSN: [id number]     Arrival date & time 02/21/15  1037 History   By signing my name below, I, Eustaquio Maize, attest that this documentation has been prepared under the direction and in the presence of Ezequiel Essex, MD. Electronically Signed: Eustaquio Maize, ED Scribe. 02/21/2015. 11:20 AM.    Chief Complaint  Patient presents with  . Pain   The history is provided by the patient. No language interpreter was used.     HPI Comments: Daniel [name] is a 59 y.o. male with hx COPD who presents to the Emergency Department complaining of gradual onset, constant, generalized weakness x 3 days. Pt also complains of myalgias, productive cough with thick green sputum, rhinorrhea, and abdominal pain. He notes having diarrhea for the past 3 weeks as well. Pt was recently put on Amoxicillin by his PCP for flu like symptoms. Denies fever, chills, nausea, vomiting, headache, chest pain, or any other associated symptoms. No recent foreign travel or sick contact. Pt is currently on Xarelto. His last CD4 count was 800.   Past Medical History  Diagnosis Date  . Diabetes mellitus without complication ([place])   . ADHD (attention deficit hyperactivity disorder)   . Depression   . GERD (gastroesophageal reflux disease)   . HIV infection ([place])   . Ulcer   . Anxiety   . Clotting disorder [facility])    Past Surgical History  Procedure Laterality Date  . Small intestine surgery     Family History  Problem Relation Age of Onset  . Cancer Brother   . COPD Mother    Social History  Substance Use Topics  . Smoking status: Former Smoker -- 0.10 packs/day for 10 years    Types: Cigars, Cigarettes  . Smokeless tobacco: Never Used  . Alcohol Use: No    Review of Systems  Constitutional: Negative for fever and chills.  HENT: Positive for rhinorrhea.   Respiratory: Positive for cough.   Cardiovascular: Negative for chest pain.  Gastrointestinal: Positive for abdominal pain. Negative for nausea and vomiting.   Musculoskeletal: Positive for myalgias.  Neurological: Positive for weakness (Generalized). Negative for headaches.  A complete 10 system review of systems was obtained and all systems are negative except as noted in the HPI and PMH.    Allergies  Sustiva  Home Medications   Prior to Admission medications   Medication Sig Start Date End Date Taking? Authorizing Provider  abacavir-dolutegravir-lamiVUDine (TRIUMEQ) 600-50-300 MG tablet Take 1 tablet by mouth daily. 02/02/15  Yes Thayer Headings, MD  ALPRAZolam Duanne Moron) 1 MG tablet Take 1 mg by mouth 3 (three) times daily as needed for anxiety.    Yes Historical Provider, MD  amoxicillin-clavulanate (AUGMENTIN) 875-125 MG tablet Take 1 tablet by mouth 2 (two) times daily. 02/20/15  Yes [name], MD  amphetamine-dextroamphetamine (ADDERALL) 30 MG tablet Take 30 mg by mouth 3 (three) times daily.    Yes Historical Provider, MD  atorvastatin (LIPITOR) 10 MG tablet TAKE ONE TABLET BY MOUTH ONCE DAILY. 01/12/15  Yes Tishira R Brewington, PA-C  diclofenac sodium (VOLTAREN) 1 % GEL Apply 2 g topically 3 (three) times daily. Apply 2 gr each knee in the am 1 gr to each knee in the afternoon and 2 gr to each knee at bedtime. 08/01/14  Yes [name], MD  ibuprofen (ADVIL,MOTRIN) 200 MG tablet Take 200 mg by mouth daily as needed for moderate pain.   Yes Historical Provider, MD  Insulin Glargine (LANTUS SOLOSTAR) 100 UNIT/ML Solostar  Pen Inject 30 units at bedtime 02/09/15  Yes [name], MD  levothyroxine (SYNTHROID, LEVOTHROID) 75 MCG tablet Take 75 mcg by mouth at bedtime.   Yes Historical Provider, MD  meclizine (ANTIVERT) 25 MG tablet One half tablet 3 times a day as needed for dizziness 02/14/15  Yes [name], MD  oxyCODONE (OXY IR/ROXICODONE) 5 MG immediate release tablet Take 0.5 tablets (2.5 mg total) by mouth every 6 (six) hours as needed. Pain. 02/14/15  Yes [name], MD  pregabalin (LYRICA) 75 MG capsule Take 1 capsule (75 mg  total) by mouth 3 (three) times daily. 09/27/14  Yes [name], MD  prochlorperazine (COMPAZINE) 10 MG tablet TAKE ONE TABLET BY MOUTH EVERY 6 HOURS AS NEEDED. 11/23/14  Yes Thayer Headings, MD  pseudoephedrine-acetaminophen (TYLENOL SINUS) 30-500 MG TABS tablet Take 1 tablet by mouth daily as needed (sleep).   Yes Historical Provider, MD  Ranitidine HCl (RANITIDINE 75 PO) Take 1 tablet by mouth daily as needed (heartburn).    Yes Historical Provider, MD  TRINTELLIX 20 MG TABS Take 20 mg by mouth at bedtime. 01/26/15  Yes Historical Provider, MD  Vortioxetine HBr (BRINTELLIX) 20 MG TABS Take 20 mg by mouth at bedtime.   Yes Historical Provider, MD  XARELTO STARTER PACK 15 & 20 MG TBPK Take 15-20 mg by mouth as directed. Take as directed on package: Start with one 56m tablet by mouth twice a day with food. On Day 22, switch to one 252mtablet once a day with food. 02/09/15  Yes ErGareth MorganMD  zolpidem (AMBIEN) 10 MG tablet Take 10-20 mg by mouth at bedtime as needed for sleep.    Yes Historical Provider, MD  amoxicillin-clavulanate (AUGMENTIN) 875-125 MG per tablet Take 1 tablet by mouth 2 (two) times daily. Patient not taking: Reported on 01/10/2015 08/09/14   StDarlyne RussianMD  Blood Glucose Monitoring Suppl (BLOOD GLUCOSE METER KIT AND SUPPLIES) Dispense based on patient and insurance preference. Use up to four times daily as directed. (FOR ICD-9 250.00, 250.01). 09/08/13   StDarlyne RussianMD  cephALEXin (KEFLEX) 500 MG capsule Take 1 capsule (500 mg total) by mouth 2 (two) times daily. Patient not taking: Reported on 02/09/2015 11/29/14   StDarlyne RussianMD  glucose blood test strip Test blood sugar daily. Dx E11.9 01/10/15   Tishira R Brewington, PA-C  mupirocin ointment (BACTROBAN) 2 % Place 1 application into the nose 2 (two) times daily. Apply small amount to the toe twice a day Patient not taking: Reported on 02/09/2015 08/09/14   StDarlyne RussianMD  UNABLE TO FIND CPAP MACHINE with standard  Aclaim nasal mask with humidifier. Set at 14 cwp 04/12/13   StDarlyne RussianMD  zoster vaccine live, PF, (ZOSTAVAX) 1925003NT/0.65ML injection Inject 19,400 Units into the skin once. Patient not taking: Reported on 01/10/2015 08/01/14   StDarlyne RussianMD   Triage Vitals: BP 129/77 mmHg  Pulse 105  Temp(Src) 98.4 F (36.9 C) (Oral)  Resp 16  Ht _0  (1.778 m)  Wt 222 lb (100.699 kg)  BMI 31.85 kg/m2  SpO2 95%   Physical Exam  Constitutional: He is oriented to person, place, and time. He appears well-developed and well-nourished. No distress.  HENT:  Head: Normocephalic and atraumatic.  Mouth/Throat: Oropharynx is clear and moist. Mucous membranes are dry. No oropharyngeal exudate.  Eyes: Conjunctivae and EOM are normal. Pupils are equal, round, and reactive to light.  Neck: Normal range of motion. Neck supple.  No meningismus.  Cardiovascular: Regular rhythm, normal heart sounds and intact distal pulses.  Tachycardia present.   No murmur heard. Pulmonary/Chest: Effort normal and breath sounds normal. No respiratory distress. He has no wheezes. He has no rales. He exhibits no tenderness.  Abdominal: Soft. There is no tenderness. There is no rebound and no guarding.  Musculoskeletal: Normal range of motion. He exhibits no edema or tenderness.  Neurological: He is alert and oriented to person, place, and time. No cranial nerve deficit. He exhibits normal muscle tone. Coordination normal.  CN 2-12 intact, moving all extremities equally.  Globally weak  Skin: Skin is warm. No rash noted.  Abrasions to bilateral knees. Abrasion RUQ.   Psychiatric: He has a normal mood and affect. His behavior is normal.  Nursing note and vitals reviewed.   ED Course  Procedures (including critical care time)  DIAGNOSTIC STUDIES: Oxygen Saturation is 95% on RA, normal by my interpretation.    COORDINATION OF CARE: 11:16 AM-Discussed treatment plan which includes CXR, UA, CBC, CMP, and Lipase with pt  at bedside and pt agreed to plan.   Labs Review Labs Reviewed  CBC WITH DIFFERENTIAL/PLATELET - Abnormal; Notable for the following:    Platelets 102 (*)    All other components within normal limits  COMPREHENSIVE METABOLIC PANEL - Abnormal; Notable for the following:    Sodium 133 (*)    Chloride 100 (*)    Glucose, Bld 221 (*)    Creatinine, Ser 1.71 (*)    Total Bilirubin 1.4 (*)    GFR calc non Af Amer 41 (*)    GFR calc Af Amer 48 (*)    All other components within normal limits  URINALYSIS, ROUTINE W REFLEX MICROSCOPIC (NOT AT [facility] [place] [facility]) - Abnormal; Notable for the following:    Color, Urine AMBER (*)    Glucose, UA 500 (*)    Hgb urine dipstick LARGE (*)    Protein, ur 100 (*)    All other components within normal limits  PROTIME-INR - Abnormal; Notable for the following:    Prothrombin Time 16.9 (*)    All other components within normal limits  URINE MICROSCOPIC-ADD ON - Abnormal; Notable for the following:    Squamous Epithelial / LPF 0-5 (*)    Casts HYALINE CASTS (*)    All other components within normal limits  CULTURE, BLOOD (ROUTINE X 2)  CULTURE, BLOOD (ROUTINE X 2)  LIPASE, BLOOD  I-STAT CG4 LACTIC ACID, ED  I-STAT CG4 LACTIC ACID, ED    Imaging Review Dg Chest 2 View  02/21/2015  CLINICAL DATA:  Weakness, shortness of breath, diabetes mellitus, HIV, former smoker EXAM: CHEST  2 VIEW COMPARISON:  None FINDINGS: Borderline enlargement of cardiac silhouette. Lordotic positioning. Mediastinal contours normal. Slight vascular congestion. Bronchitic changes without infiltrate, pleural effusion or pneumothorax. No acute osseous findings. IMPRESSION: No definite acute abnormalities. Borderline enlargement of cardiac silhouette with slight pulmonary vascular congestion. Electronically Signed   By: Lavonia Dana M.D.   On: 02/21/2015 12:20   I have personally reviewed and evaluated these images and lab results as part of my medical decision-making.   EKG  Interpretation   Date/Time:  Wednesday February 21 2015 10:47:02 EST Ventricular Rate:  105 PR Interval:  185 QRS Duration: 106 QT Interval:  321 QTC Calculation: 424 R Axis:   96 Text Interpretation:  Sinus tachycardia Right axis deviation Rate faster  Confirmed by Wyvonnia Dusky  MD, Aloysuis Ribaudo [phone])  on 02/21/2015 10:51:38 AM      MDM   Final diagnoses:  Viral syndrome  weakness, bodyaches, chills, cough, x 3 days.  Too weak to get up today.  Denies fever. Has been on amoxicillin for apparent URI.  Hx HIV but CD4 900 in November.  CXR negative.  Cr mildly elevated  IVF and PO fluids given. UA with hematuria. (on xarelto).  Dizziness with attempted standing for orthostatic vitals. Unable to walk. Unclear source of weakness  Possible viral infection. Observation admission dw Dr. Sarajane Jews for continued hydration as patient is unable to walk.    I personally performed the services described in this documentation, which was scribed in my presence. The recorded information has been reviewed and is accurate.      Ezequiel Essex, MD 02/21/15 [phone]

## 2015-02-21 NOTE — ED Notes (Signed)
Pt here for evaluation of aching. States he was evaluated by his PCP and was given Amox. States he is no better

## 2015-02-21 NOTE — Telephone Encounter (Signed)
I received a phone call yesterday the patient was having a productive cough. He was not able to come to our office. I did place him on antibiotics. He agreed to go to the emergency room if he had any worsening of his symptoms.

## 2015-02-22 DIAGNOSIS — B20 Human immunodeficiency virus [HIV] disease: Secondary | ICD-10-CM | POA: Diagnosis not present

## 2015-02-22 DIAGNOSIS — N189 Chronic kidney disease, unspecified: Secondary | ICD-10-CM | POA: Diagnosis not present

## 2015-02-22 LAB — COMPREHENSIVE METABOLIC PANEL
ALK PHOS: 84 U/L (ref 38–126)
ALT: 17 U/L (ref 17–63)
AST: 28 U/L (ref 15–41)
Albumin: 3.2 g/dL — ABNORMAL LOW (ref 3.5–5.0)
Anion gap: 5 (ref 5–15)
BILIRUBIN TOTAL: 1.4 mg/dL — AB (ref 0.3–1.2)
BUN: 17 mg/dL (ref 6–20)
CO2: 27 mmol/L (ref 22–32)
CREATININE: 1.59 mg/dL — AB (ref 0.61–1.24)
Calcium: 8.6 mg/dL — ABNORMAL LOW (ref 8.9–10.3)
Chloride: 106 mmol/L (ref 101–111)
GFR calc Af Amer: 52 mL/min — ABNORMAL LOW (ref >60)
GFR, EST NON AFRICAN AMERICAN: 45 mL/min — AB (ref >60)
Glucose, Bld: 145 mg/dL — ABNORMAL HIGH (ref 65–99)
Potassium: 3.3 mmol/L — ABNORMAL LOW (ref 3.5–5.1)
Sodium: 138 mmol/L (ref 135–145)
TOTAL PROTEIN: 6.2 g/dL — AB (ref 6.5–8.1)

## 2015-02-22 LAB — GLUCOSE, CAPILLARY
Glucose-Capillary: 125 mg/dL — ABNORMAL HIGH (ref 65–99)
Glucose-Capillary: 145 mg/dL — ABNORMAL HIGH (ref 65–99)

## 2015-02-22 LAB — CBC
HCT: 40.4 % (ref 39.0–52.0)
Hemoglobin: 14.2 g/dL (ref 13.0–17.0)
MCH: 33.2 pg (ref 26.0–34.0)
MCHC: 35.1 g/dL (ref 30.0–36.0)
MCV: 94.4 fL (ref 78.0–100.0)
PLATELETS: 95 10*3/uL — AB (ref 150–400)
RBC: 4.28 MIL/uL (ref 4.22–5.81)
RDW: 13.8 % (ref 11.5–15.5)
WBC: 4.5 10*3/uL (ref 4.0–10.5)

## 2015-02-22 MED ORDER — POTASSIUM CHLORIDE CRYS ER 20 MEQ PO TBCR
40.0000 meq | EXTENDED_RELEASE_TABLET | Freq: Once | ORAL | Status: AC
  Administered 2015-02-22: 40 meq via ORAL
  Filled 2015-02-22: qty 2

## 2015-02-22 NOTE — Progress Notes (Signed)
Patient states understanding of discharge instructions.  

## 2015-02-22 NOTE — Care Management Note (Signed)
Case Management Note  Patient Details  Name: Daniel [name] MRN: [id number] Date of Birth: 1956-01-05  Subjective/Objective:                  Pt is from home, lives with family and is ind with ADLs. Pt has cane and walker at home if he needs them. PT has recommended no PT f/u. Pt does not feel he would benefit from [facility] nursing. Pt plans to return home with self care at DC.   Action/Plan: Pt discharging home today with self care. No CM needs.   Expected Discharge Date:  02/24/15               Expected Discharge Plan:  Home/Self Care  In-House Referral:  NA  Discharge planning Services  CM Consult  Post Acute Care Choice:  NA Choice offered to:  NA  DME Arranged:    DME Agency:     HH Arranged:    HH Agency:     Status of Service:  Completed, signed off  Medicare Important Message Given:    Date Medicare IM Given:    Medicare IM give by:    Date Additional Medicare IM Given:    Additional Medicare Important Message give by:     If discussed at [place] of Stay Meetings, dates discussed:    Additional Comments:  Sherald Barge, RN 02/22/2015, 12:36 PM

## 2015-02-22 NOTE — Discharge Summary (Signed)
Physician Discharge Summary  Daniel [name] [facility] TOI:[id number] DOB: 21-Sep-1955 DOA: 02/21/2015  PCP: Daniel Reichmann, MD  Admit date: 02/21/2015 Discharge date: 02/22/2015  Time spent: 45 minutes  Recommendations for Outpatient Follow-up:  -We'll be discharge home today. -Advised to follow-up with primary care provider in 2 weeks.   Discharge Diagnoses:  Active Problems:   Human immunodeficiency virus (HIV) disease ([place])   Diabetes ([place])   Chronic renal insufficiency, stage III (moderate)   Weakness   Discharge Condition: Stable and improved  Filed Weights   02/21/15 1053 02/21/15 2139  Weight: 100.699 kg (222 lb) 100.699 kg (222 lb)    History of present illness:  As per Dr. Anastasio [name] 12/28: Daniel [name] is [name] 59 y.o. male  This is [name] 59 year old man who came to the hospital because of weakness. He was unable to get up by himself. He also has had productive cough with thick green sputum, rhinorrhea. He also has had diarrhea about 2 weeks ago but says he has not had any recently. The patient was started on amoxicillin by his PCP for flulike symptoms. The patient denies fever, chills, nausea or vomiting. There is no headache. There is no chest pain or dyspnea. The patient was diagnosed with leg DVT approximately [name] couple weeks ago and is on anticoagulation. He also has HIV disease for the last 10-15 years but his most recent CD count was 800. He is on antiretroviral therapy. He is diabetic.  Hospital Course:   Generalized weakness -Etiology is unclear, however this has completely resolved. He has ambulated with physical therapy without any difficulty. -I wonder if his acute respiratory infection may be playing [name] role. -He is advised to continue his prescribed course of amoxicillin as well as his other medications without change.  Procedures:  None   Consultations:  None  Discharge Instructions  Discharge Instructions    Diet - low sodium heart healthy    Complete by:   As directed      Increase activity slowly    Complete by:  As directed             Medication List    TAKE these medications        abacavir-dolutegravir-lamiVUDine 600-50-300 MG tablet  Commonly known as:  TRIUMEQ  Take 1 tablet by mouth daily.     ALPRAZolam 1 MG tablet  Commonly known as:  XANAX  Take 1 mg by mouth 3 (three) times daily as needed for anxiety.     AMBIEN 10 MG tablet  Generic drug:  zolpidem  Take 10-20 mg by mouth at bedtime as needed for sleep.     amoxicillin-clavulanate 875-125 MG tablet  Commonly known as:  AUGMENTIN  Take 1 tablet by mouth 2 (two) times daily.     amphetamine-dextroamphetamine 30 MG tablet  Commonly known as:  ADDERALL  Take 30 mg by mouth 3 (three) times daily.     atorvastatin 10 MG tablet  Commonly known as:  LIPITOR  TAKE ONE TABLET BY MOUTH ONCE DAILY.     blood glucose meter kit and supplies  Dispense based on patient and insurance preference. Use up to four times daily as directed. (FOR ICD-9 250.00, 250.01).     BRINTELLIX 20 MG Tabs  Generic drug:  Vortioxetine HBr  Take 20 mg by mouth at bedtime.     TRINTELLIX 20 MG Tabs  Generic drug:  Vortioxetine HBr  Take 20 mg by mouth at bedtime.  diclofenac sodium 1 % Gel  Commonly known as:  VOLTAREN  Apply 2 g topically 3 (three) times daily. Apply 2 gr each knee in the am 1 gr to each knee in the afternoon and 2 gr to each knee at bedtime.     glucose blood test strip  Test blood sugar daily. Dx E11.9     ibuprofen 200 MG tablet  Commonly known as:  ADVIL,MOTRIN  Take 200 mg by mouth daily as needed for moderate pain.     Insulin Glargine 100 UNIT/ML Solostar Pen  Commonly known as:  LANTUS SOLOSTAR  Inject 30 units at bedtime     levothyroxine 75 MCG tablet  Commonly known as:  SYNTHROID, LEVOTHROID  Take 75 mcg by mouth at bedtime.     meclizine 25 MG tablet  Commonly known as:  ANTIVERT  One half tablet 3 times [name] day as needed for dizziness      oxyCODONE 5 MG immediate release tablet  Commonly known as:  Oxy IR/ROXICODONE  Take 0.5 tablets (2.5 mg total) by mouth every 6 (six) hours as needed. Pain.     pregabalin 75 MG capsule  Commonly known as:  LYRICA  Take 1 capsule (75 mg total) by mouth 3 (three) times daily.     prochlorperazine 10 MG tablet  Commonly known as:  COMPAZINE  TAKE ONE TABLET BY MOUTH EVERY 6 HOURS AS NEEDED.     pseudoephedrine-acetaminophen 30-500 MG Tabs tablet  Commonly known as:  TYLENOL SINUS  Take 1 tablet by mouth daily as needed (sleep).     RANITIDINE 75 PO  Take 1 tablet by mouth daily as needed (heartburn).     UNABLE TO FIND  CPAP MACHINE with standard Aclaim nasal mask with humidifier. Set at 14 cwp     XARELTO STARTER PACK 15 & 20 MG Tbpk  Generic drug:  Rivaroxaban  Take 15-20 mg by mouth as directed. Take as directed on package: Start with one 3m tablet by mouth twice [name] day with food. On Day 22, switch to one 266mtablet once [name] day with food.       Allergies  Allergen Reactions  . Sustiva [Efavirenz] Rash       Follow-up Information    Follow up with DAUB, STEVE A, MD. Schedule an appointment as soon as possible for [name] visit in 2 weeks.   Specialty:  Family Medicine   Contact information:   [phone]      The results of significant diagnostics from this hospitalization (including imaging, microbiology, ancillary and laboratory) are listed below for reference.    Significant Diagnostic Studies: Dg Chest 2 View  02/21/2015  CLINICAL DATA:  Weakness, shortness of breath, diabetes mellitus, HIV, former smoker EXAM: CHEST  2 VIEW COMPARISON:  None FINDINGS: Borderline enlargement of cardiac silhouette. Lordotic positioning. Mediastinal contours normal. Slight vascular congestion. Bronchitic changes without infiltrate, pleural effusion or pneumothorax. No acute osseous findings. IMPRESSION: No definite acute abnormalities.  Borderline enlargement of cardiac silhouette with slight pulmonary vascular congestion. Electronically Signed   By: MaLavonia Dana.D.   On: 02/21/2015 12:20   Dg Wrist 2 Views Left  02/09/2015  CLINICAL DATA:  Fall 10 days ago. EXAM: LEFT WRIST - 2 VIEW COMPARISON:  08/01/2014 FINDINGS: There is an age indeterminate deformity involving the all ulnar styloid. Interval healing of distal radius fracture. No dislocations. IMPRESSION: 1. Ulnar styloid fracture, age indeterminate. 2. Interval healing of distal  radius fracture. Electronically Signed   By: Kerby Moors M.D.   On: 02/09/2015 16:48   Ct Head Wo Contrast  02/09/2015  CLINICAL DATA:  59 year old male with headache and dizziness following fall and head injury 2 weeks ago. EXAM: CT HEAD WITHOUT CONTRAST TECHNIQUE: Contiguous axial images were obtained from the base of the skull through the vertex without intravenous contrast. COMPARISON:  08/09/2014 CT FINDINGS: Very mild chronic small-vessel white matter ischemic changes are noted. No acute intracranial abnormalities are identified, including mass lesion or mass effect, hydrocephalus, extra-axial fluid collection, midline shift, hemorrhage, or acute infarction. The visualized bony calvarium is unremarkable. IMPRESSION: No evidence of acute intracranial abnormality. Electronically Signed   By: Margarette Canada M.D.   On: 02/09/2015 17:53    Microbiology: Recent Results (from the past 240 hour(s))  Blood culture (routine x 2)     Status: None (Preliminary result)   Collection Time: 02/21/15 11:27 AM  Result Value Ref Range Status   Specimen Description BLOOD LEFT ANTECUBITAL  Final   Special Requests   Final    BOTTLES DRAWN AEROBIC AND ANAEROBIC AEB=10CC ANA=6CC   Culture NO GROWTH < 24 HOURS  Final   Report Status PENDING  Incomplete  Blood culture (routine x 2)     Status: None (Preliminary result)   Collection Time: 02/21/15 11:29 AM  Result Value Ref Range Status   Specimen Description  BLOOD RIGHT ANTECUBITAL DRAWN BY RN  Final   Special Requests BOTTLES DRAWN AEROBIC AND ANAEROBIC 6CC EACH  Final   Culture NO GROWTH < 24 HOURS  Final   Report Status PENDING  Incomplete     Labs: Basic Metabolic Panel:  Recent Labs Lab 02/21/15 1123 02/22/15 0529  NA 133* 138  K 3.6 3.3*  CL 100* 106  CO2 24 27  GLUCOSE 221* 145*  BUN 16 17  CREATININE 1.71* 1.59*  CALCIUM 9.1 8.6*   Liver Function Tests:  Recent Labs Lab 02/21/15 1123 02/22/15 0529  AST 19 28  ALT 17 17  ALKPHOS 122 84  BILITOT 1.4* 1.4*  PROT 7.7 6.2*  ALBUMIN 4.0 3.2*    Recent Labs Lab 02/21/15 1123  LIPASE 15   No results for input(s): AMMONIA in the last 168 hours. CBC:  Recent Labs Lab 02/21/15 1123 02/22/15 0529  WBC 7.1 4.5  NEUTROABS 5.2  --   HGB 15.6 14.2  HCT 45.0 40.4  MCV 93.8 94.4  PLT 102* 95*   Cardiac Enzymes: No results for input(s): CKTOTAL, CKMB, CKMBINDEX, TROPONINI in the last 168 hours. BNP: BNP (last 3 results) No results for input(s): BNP in the last 8760 hours.  ProBNP (last 3 results) No results for input(s): PROBNP in the last 8760 hours.  CBG:  Recent Labs Lab 02/21/15 1715 02/21/15 2114 02/22/15 0757  GLUCAP 153* 127* 125*       Signed:  Lelon Frohlich  [facility] Pager: [phone] 02/22/2015, 12:26 PM

## 2015-02-22 NOTE — Evaluation (Signed)
Physical Therapy Evaluation Patient Details Name: Daniel [name] MRN: [id number] DOB: [birth year] Today's Date: 02/22/2015   History of Present Illness  This is a 59 year old man who came to the hospital because of weakness. He was unable to get up by himself. He also has had productive cough with thick green sputum, rhinorrhea. He also has had diarrhea about 2 weeks ago but says he has not had any recently. The patient was started on amoxicillin by his PCP for flulike symptoms. The patient denies fever, chills, nausea or vomiting. There is no headache. There is no chest pain or dyspnea. The patient was diagnosed with leg DVT approximately a couple weeks ago and is on anticoagulation. He also has HIV disease for the last 10-15 years but his most recent CD count was 800. He is on antiretroviral therapy. He is diabetic.  Clinical Impression   Pt was seen for evaluation.  His initial sx of severe generalized weakness appears to have resolved.  He is at prior functional level.  I have recommended that he increase his basic level of activity as he is very sedentary at home and reports an overall decline in his baseline strength/endurance.    Follow Up Recommendations No PT follow up    Equipment Recommendations  None recommended by PT    Recommendations for Other Services   none    Precautions / Restrictions Precautions Precautions: None Restrictions Weight Bearing Restrictions: No      Mobility  Bed Mobility Overal bed mobility: Independent                Transfers Overall transfer level: Independent                  Ambulation/Gait Ambulation/Gait assistance: Independent Ambulation Distance (Feet): 200 Feet Assistive device: None Gait Pattern/deviations: WFL(Within Functional Limits)   Gait velocity interpretation: >2.62 ft/sec, indicative of independent community Conservation officer, historic buildings Rankin (Stroke Patients  Only)       Balance Overall balance assessment: Modified Independent                                           Pertinent Vitals/Pain Pain Assessment: No/denies pain    Home Living Family/patient expects to be discharged to:: Private residence Living Arrangements: Other relatives Available Help at Discharge: Family;Available 24 hours/day Type of Home: Apartment Home Access: Level entry     Home Layout: One level Home Equipment: None      Prior Function Level of Independence: Independent               Hand Dominance        Extremity/Trunk Assessment   Upper Extremity Assessment: Defer to OT evaluation           Lower Extremity Assessment: Overall WFL for tasks assessed      Cervical / Trunk Assessment: Normal  Communication   Communication: No difficulties  Cognition Arousal/Alertness: Awake/alert Behavior During Therapy: WFL for tasks assessed/performed Overall Cognitive Status: Within Functional Limits for tasks assessed                      General Comments      Exercises        Assessment/Plan    PT Assessment Patent does not need  any further PT services  PT Diagnosis     PT Problem List    PT Treatment Interventions     PT Goals (Current goals can be found in the Care Plan section) Acute Rehab PT Goals PT Goal Formulation: All assessment and education complete, DC therapy    Frequency     Barriers to discharge        Co-evaluation               End of Session Equipment Utilized During Treatment: Gait belt Activity Tolerance: Patient tolerated treatment well Patient left: in bed;with call bell/phone within reach      Functional Assessment Tool Used: clinical judgement Functional Limitation: Mobility: Walking and moving around Mobility: Walking and Moving Around Current Status [phone]): 0 percent impaired, limited or restricted Mobility: Walking and Moving Around Goal Status [phone]): 0  percent impaired, limited or restricted Mobility: Walking and Moving Around Discharge Status [phone]): 0 percent impaired, limited or restricted    Time: 0945-1001 PT Time Calculation (min) (ACUTE ONLY): 16 min   Charges:   PT Evaluation $Initial PT Evaluation Tier I: 1 Procedure     PT G Codes:   PT G-Codes **NOT FOR INPATIENT CLASS** Functional Assessment Tool Used: clinical judgement Functional Limitation: Mobility: Walking and moving around Mobility: Walking and Moving Around Current Status (O8416): 0 percent impaired, limited or restricted Mobility: Walking and Moving Around Goal Status (S0630): 0 percent impaired, limited or restricted Mobility: Walking and Moving Around Discharge Status (Z6010): 0 percent impaired, limited or restricted    Daniel [name]  PT 02/22/2015, 10:07 AM [phone]

## 2015-02-23 ENCOUNTER — Encounter: Payer: Self-pay | Admitting: Family Medicine

## 2015-02-23 ENCOUNTER — Other Ambulatory Visit: Payer: Self-pay | Admitting: Emergency Medicine

## 2015-02-26 LAB — CULTURE, BLOOD (ROUTINE X 2)
CULTURE: NO GROWTH
CULTURE: NO GROWTH

## 2015-02-27 ENCOUNTER — Telehealth: Payer: Self-pay

## 2015-02-27 ENCOUNTER — Other Ambulatory Visit: Payer: Self-pay | Admitting: Emergency Medicine

## 2015-02-27 ENCOUNTER — Encounter: Payer: Self-pay | Admitting: Internal Medicine

## 2015-02-27 ENCOUNTER — Ambulatory Visit (INDEPENDENT_AMBULATORY_CARE_PROVIDER_SITE_OTHER): Payer: BLUE CROSS/BLUE SHIELD | Admitting: Internal Medicine

## 2015-02-27 VITALS — BP 110/71 | HR 84 | Temp 98.2°F | Ht 70.0 in | Wt 212.1 lb

## 2015-02-27 DIAGNOSIS — B20 Human immunodeficiency virus [HIV] disease: Secondary | ICD-10-CM

## 2015-02-27 DIAGNOSIS — N183 Chronic kidney disease, stage 3 unspecified: Secondary | ICD-10-CM

## 2015-02-27 DIAGNOSIS — N189 Chronic kidney disease, unspecified: Secondary | ICD-10-CM

## 2015-02-27 DIAGNOSIS — F32A Depression, unspecified: Secondary | ICD-10-CM

## 2015-02-27 DIAGNOSIS — F329 Major depressive disorder, single episode, unspecified: Secondary | ICD-10-CM

## 2015-02-27 NOTE — Progress Notes (Signed)
   Subjective:    Patient ID: Daniel [name], male    DOB: 1955/10/19, 60 y.o.   MRN: [id number]  HPI He comes in here for follow-up.    He had been on Stribild but with significantly elevated creatinine with normal BUN suggestive of intrinsic renal disease and he was changed to Triumeq.  I did attempt to get Genvoya as a replacement since it has no renal toxicity compared to tenofovir however his insurance denied this. He follows Dr. Clover Mealy at [facility] and reports doing well there.  Was recently hospitalized for weakness of unknown etiology, recent DVT and cracked left arm. Some depression, no suicidal ideation, follows with his psychiatrist.    Review of Systems  Constitutional: Negative for fatigue.  HENT: Negative for trouble swallowing.   Gastrointestinal: Negative for nausea and diarrhea.  Genitourinary: Negative for discharge.  Skin: Negative for rash.  Psychiatric/Behavioral: Negative for sleep disturbance.       Objective:   Physical Exam  Constitutional: He appears well-developed and well-nourished. No distress.  Eyes: No scleral icterus.  Cardiovascular: Normal rate, regular rhythm and normal heart sounds.   No murmur heard. Pulmonary/Chest: Effort normal and breath sounds normal. No respiratory distress.  Lymphadenopathy:    He has no cervical adenopathy.  Skin: No rash noted.   Social History   Social History  . Marital Status: Single    Spouse Name: N/A  . Number of Children: N/A  . Years of Education: N/A   Occupational History  . Not on file.   Social History Main Topics  . Smoking status: Former Smoker -- 0.10 packs/day for 10 years    Types: Cigars, Cigarettes  . Smokeless tobacco: Never Used  . Alcohol Use: No  . Drug Use: No  . Sexual Activity:    Partners: Male     Comment: pt. declined condoms   Other Topics Concern  . Not on file   Social History Narrative         Assessment & Plan:

## 2015-02-27 NOTE — Assessment & Plan Note (Signed)
Doing good on Triumeq.  Wil check again today and if ok, rtc 6 months.

## 2015-02-27 NOTE — Assessment & Plan Note (Signed)
Some continued depression but nothing new or concerning symptoms.  Encouraged continued follow up with his psychiatrist.

## 2015-02-27 NOTE — Assessment & Plan Note (Signed)
Stable creatinine and will continue on Triumeq.

## 2015-02-27 NOTE — Telephone Encounter (Signed)
Wants to know if there is a copay  for lyirca?  [phone]

## 2015-02-28 LAB — HIV-1 RNA QUANT-NO REFLEX-BLD
HIV 1 RNA Quant: <20 copies/mL (ref <20)
HIV-1 RNA Quant, Log: <1.3 Log copies/mL (ref <1.30)

## 2015-02-28 LAB — T-HELPER CELL (CD4) - (RCID CLINIC ONLY)
CD4 T CELL HELPER: 38 % (ref 33–55)
CD4 T Cell Abs: 740 /uL (ref 400–2700)

## 2015-02-28 NOTE — Telephone Encounter (Signed)
Left message that I was unsure and it is up to his insurance

## 2015-03-08 ENCOUNTER — Telehealth: Payer: Self-pay

## 2015-03-08 ENCOUNTER — Telehealth: Payer: Self-pay | Admitting: *Deleted

## 2015-03-08 NOTE — Telephone Encounter (Signed)
Pharm faxed req to change Lyrica to something cheaper. It is costing pt $80 co-pay, and pt would like something less expensive.

## 2015-03-08 NOTE — Telephone Encounter (Signed)
Left detailed VM with options. Asked pt to call back and let us know what he wants to do.

## 2015-03-08 NOTE — Telephone Encounter (Signed)
Call the patient and see what he wants to do. His only choice is to go back on gabapentin. I would suggest he try and continue the Lyrica because it has helped him more than gabapentin.

## 2015-03-08 NOTE — Telephone Encounter (Signed)
Disability form faxed to [phone], Attn: Mancel Bale. Confirmation received and original put in scan box. Myrtis Hopping

## 2015-03-13 ENCOUNTER — Other Ambulatory Visit: Payer: Self-pay

## 2015-03-13 ENCOUNTER — Telehealth: Payer: Self-pay | Admitting: Emergency Medicine

## 2015-03-13 MED ORDER — RIVAROXABAN 20 MG PO TABS: 20.0000 mg | ORAL_TABLET | Freq: Every day | ORAL | Status: DC

## 2015-03-13 NOTE — Telephone Encounter (Signed)
Yes be sure the patient stays on  Xarelto . Also tell him he cannot take any nonsteroidals such as ibuprofen or voltaren while he is on  Xarelto.

## 2015-03-13 NOTE — Telephone Encounter (Signed)
Called pt and advised message from provider on their voicemail.

## 2015-03-13 NOTE — Telephone Encounter (Signed)
Dr Everlene Farrier, you got a fax from [facility] requesting RFs of xarelto. It looks like pt was started on this in ED 02/09/15, and didn't know if you want pt to continue this or see a specialist, RTC? I will pend for the 20 mg tabs QD since he has finished the starter pak.

## 2015-03-14 ENCOUNTER — Other Ambulatory Visit: Payer: Self-pay | Admitting: *Deleted

## 2015-03-14 ENCOUNTER — Telehealth: Payer: Self-pay | Admitting: *Deleted

## 2015-03-14 ENCOUNTER — Telehealth: Payer: Self-pay

## 2015-03-14 DIAGNOSIS — H8113 Benign paroxysmal vertigo, bilateral: Secondary | ICD-10-CM

## 2015-03-14 MED ORDER — ABACAVIR-DOLUTEGRAVIR-LAMIVUD 600-50-300 MG PO TABS: 1.0000 | ORAL_TABLET | Freq: Every day | ORAL | Status: DC

## 2015-03-14 NOTE — Telephone Encounter (Signed)
Pt is needing to change his meds meclizine 25 mg for dizziness he says that he increased the dosage to a full pill 3 times a day and it doesn't help   Please call [phone]

## 2015-03-14 NOTE — Telephone Encounter (Signed)
Patient called and advised he has a new pharmacy as of Jan 2017. [facility] added it to his chart and sent medications.

## 2015-03-15 NOTE — Telephone Encounter (Signed)
Dr. Everlene Farrier any suggestions?

## 2015-03-15 NOTE — Telephone Encounter (Signed)
Advised pt. He will be in to see you on Saturday. Referral placed.

## 2015-03-15 NOTE — Telephone Encounter (Signed)
He should stay on that medicine until we can get him into ENT and get their help. Please make referral to [facility]NT for evaluation of dizziness. I am also working Saturday and Sunday I would be happy to see Daniel [name] at the walk-in clinic.

## 2015-03-16 ENCOUNTER — Encounter: Payer: Self-pay | Admitting: Cardiovascular Disease

## 2015-03-16 ENCOUNTER — Ambulatory Visit (INDEPENDENT_AMBULATORY_CARE_PROVIDER_SITE_OTHER): Payer: BLUE CROSS/BLUE SHIELD | Admitting: Cardiovascular Disease

## 2015-03-16 VITALS — BP 124/86 | HR 90 | Ht 70.0 in | Wt 219.3 lb

## 2015-03-16 DIAGNOSIS — I82401 Acute embolism and thrombosis of unspecified deep veins of right lower extremity: Secondary | ICD-10-CM | POA: Diagnosis not present

## 2015-03-16 DIAGNOSIS — R0602 Shortness of breath: Secondary | ICD-10-CM | POA: Diagnosis not present

## 2015-03-16 DIAGNOSIS — R42 Dizziness and giddiness: Secondary | ICD-10-CM

## 2015-03-16 DIAGNOSIS — E785 Hyperlipidemia, unspecified: Secondary | ICD-10-CM | POA: Diagnosis not present

## 2015-03-16 NOTE — Patient Instructions (Addendum)
Dr [name] has made no changes to your current medications or treatment plan.  Your physician has requested that you have a carotid duplex. This test is an ultrasound of the carotid arteries in your neck. It looks at blood flow through these arteries that supply the brain with blood. Allow one hour for this exam. There are no restrictions or special instructions.  Your physician has requested that you have an echocardiogram. Echocardiography is a painless test that uses sound waves to create images of your heart. It provides your doctor with information about the size and shape of your heart and how well your heart's chambers and valves are working. This procedure takes approximately one hour. There are no restrictions for this procedure.  Dr [name] recommends that you schedule a follow-up appointment in 3 months.  Your physician recommends that you INCREASE your fluid intake and WEAR a compression stocking on the NON-DVT leg. Compression stockings can be purchased at any pharmacy.  Compression Stockings Compression stockings are elastic stockings that "compress" your legs. This helps to increase blood flow, decrease swelling, and reduces the chance of getting blood clots in your lower legs. Compression stockings are used:  After surgery.  If you have a history of poor circulation.  If you are prone to blood clots.  If you have varicose veins.  If you sit or are bedridden for long periods of time. WEARING COMPRESSION STOCKINGS  Your compression stockings should be worn as instructed by your caregiver.  Wearing the correct stocking size is important. Your caregiver can help measure and fit you to the correct size.  When wearing your stockings, do not allow the stockings to bunch up. This is especially important around your toes or behind your knees. Keep the stockings as smooth as possible.  Do not roll the stockings downward and leave them rolled down. This can form a restrictive  band around your legs and can decrease blood flow.  The stockings should be removed once a day for 1 hour or as instructed by your caregiver. When the stockings are taken off, inspect your legs and feet. Look for:  Open sores.  Red spots.  Puffy areas (swelling).  Anything that does not seem normal. IMPORTANT INFORMATION ABOUT COMPRESSION STOCKINGS  The compression stockings should be clean, dry, and in good condition before you put them on.  Do not put lotion on your legs or feet. This makes it harder to put the stockings on.  Change your stockings immediately if they become wet or soiled.  Do not wear stockings that are ripped or torn.  You may hand-wash or put your stockings in the washing machine. Use cold or warm water with mild detergent. Do not bleach your stockings. They may be air-dried or dried in the dryer on low heat.  If you have pain or have a feeling of "pins and needles" in your feet or legs, you may be wearing stockings that are too tight. Call your caregiver right away. SEEK IMMEDIATE MEDICAL CARE IF:   You have numbness or tingling in your lower legs that does not get better quickly after the stockings are removed.  Your toes or feet become cold and blue.  You develop open sores or have red spots on your legs that do not go away. MAKE SURE YOU:   Understand these instructions.  Will watch your condition.  Will get help right away if you are not doing well or get worse. Document Released: 12/08/2008 Document Revised: 05/05/2011 Document Reviewed:  12/08/2008 ExitCare Patient Information 2014 [place].

## 2015-03-16 NOTE — Progress Notes (Signed)
Cardiology Office Note   Date:  03/17/2015   ID:  Daniel, [name] 1955-06-16, MRN [id number]  PCP:  Jenny Reichmann, MD  Cardiologist:   Sharol Harness, MD   Chief Complaint  Patient presents with  . New Patient (Initial Visit)      no chest pain, frequently shortness of breath, has edema, no pain or cramping in legs-has a bllod clot in leg, has lightheadedness or dizziness     History of Present Illness: Daniel [name] is a 60 y.o. male with HIV and diabetes who presents for an evaluation of dizziness.  Daniel [name] reports dizziness that has been ongoing since September or October. He has suffered recurrent falls since that time.  When he changes position he feels as though the room is spinning. It is worse when he leans his head back or lays down. It also occurs when standing up quickly but resolves within a few moments.  He has fallen more times than he remember.  The last fall occurred two days ago.  He stood up after laying down and fell within a few moments of standing.  There was no preceding chest pain, shortness of breath or palpitations.  He has been taking meclizine but it isn't helping.  Daniel [name] was diagnosed with a DVT on 02/09/15.  He was started on Xarelto for anticoagulation.  He reports a history of DVT and PE 10 years ago.  He previously saw a hematologist but never found out why he had blood clots.  He understands that he will be on Xarelto indefinitely.  He notes exertional shortness of breath that is long-standing.  He denies palpitations, lower extremity edema, orthopnea or PND.  Daniel [name] does not get any exercise.  He states that his diet is terrible.    Past Medical History  Diagnosis Date  . Diabetes mellitus without complication ([place])   . ADHD (attention deficit hyperactivity disorder)   . Depression   . GERD (gastroesophageal reflux disease)   . HIV infection ([place])   . Ulcer   . Anxiety   . Clotting disorder ([place])   . Dizziness 03/17/2015     Past Surgical History  Procedure Laterality Date  . Small intestine surgery       Current Outpatient Prescriptions  Medication Sig Dispense Refill  . abacavir-dolutegravir-lamiVUDine (TRIUMEQ) 600-50-300 MG tablet Take 1 tablet by mouth daily. 30 tablet 5  . ALPRAZolam (XANAX) 1 MG tablet Take 1 mg by mouth 3 (three) times daily as needed for anxiety.     [name] amphetamine-dextroamphetamine (ADDERALL) 30 MG tablet Take 30 mg by mouth 3 (three) times daily.     [name] atorvastatin (LIPITOR) 10 MG tablet TAKE ONE TABLET BY MOUTH ONCE DAILY. 90 tablet 11  . Blood Glucose Monitoring Suppl (BLOOD GLUCOSE METER KIT AND SUPPLIES) Dispense based on patient and insurance preference. Use up to four times daily as directed. (FOR ICD-9 250.00, 250.01). 1 each 0  . glucose blood test strip Test blood sugar daily. Dx E11.9 50 each 11  . LANTUS SOLOSTAR 100 UNIT/ML Solostar Pen INJECT 30 UNITS SUBCUTANEOUSLY AT BEDTIME. 15 mL 2  . levothyroxine (SYNTHROID, LEVOTHROID) 75 MCG tablet Take 75 mcg by mouth at bedtime.    . meclizine (ANTIVERT) 25 MG tablet One half tablet 3 times a day as needed for dizziness 30 tablet 0  . oxyCODONE (OXY IR/ROXICODONE) 5 MG immediate release tablet Take 0.5 tablets (2.5 mg total) by mouth every 6 (six)  hours as needed. Pain. 30 tablet 0  . pregabalin (LYRICA) 75 MG capsule Take 1 capsule (75 mg total) by mouth 3 (three) times daily. 90 capsule 5  . prochlorperazine (COMPAZINE) 10 MG tablet TAKE ONE TABLET BY MOUTH EVERY 6 HOURS AS NEEDED. 120 tablet 0  . Ranitidine HCl (RANITIDINE 75 PO) Take 1 tablet by mouth daily as needed (heartburn).     . rivaroxaban (XARELTO) 20 MG TABS tablet Take 1 tablet (20 mg total) by mouth daily. 30 tablet 5  . TRINTELLIX 20 MG TABS Take 20 mg by mouth at bedtime.    [name] UNABLE TO FIND CPAP MACHINE with standard [employer] nasal mask with humidifier. Set at 14 cwp 1 each 0  . VOLTAREN 1 % GEL APPLY 2 GRAMS TO EACH KNEE IN THE MORNING AND AT BEDTIME AND  APPLY 1 GRAM TO EACH KNEE IN THE AFTERNOON. 300 g 5  . zolpidem (AMBIEN) 10 MG tablet Take 10-20 mg by mouth at bedtime as needed for sleep.      No current facility-administered medications for this visit.    Allergies:   Sustiva    Social History:  The patient  reports that he has quit smoking. His smoking use included Cigars and Cigarettes. He has a 1 pack-year smoking history. He has never used smokeless tobacco. He reports that he does not drink alcohol or use illicit drugs.   Family History:  The patient's family history includes COPD in his mother; Cancer in his brother.    ROS:  Please see the history of present illness.   Otherwise, review of systems are positive for none.   All other systems are reviewed and negative.    PHYSICAL EXAM: VS:  BP 124/86 mmHg  Pulse 90  Ht 5' 10"  (1.778 m)  Wt 99.479 kg (219 lb 5 oz)  BMI 31.47 kg/m2 , BMI Body mass index is 31.47 kg/(m^2).  Lying 132/82, HR 80; Sitting 124/80, HR 88; Standing 112/72, HR 100 GENERAL:  Well appearing HEENT:  Pupils equal round and reactive, fundi not visualized, oral mucosa unremarkable NECK:  No jugular venous distention, waveform within normal limits, carotid upstroke brisk and symmetric, no bruits, no thyromegaly LYMPHATICS:  No cervical adenopathy LUNGS:  Clear to auscultation bilaterally HEART:  RRR.  PMI not displaced or sustained,S1 and S2 within normal limits, no S3, no S4, no clicks, no rubs, no murmurs ABD:  Flat, positive bowel sounds normal in frequency in pitch, no bruits, no rebound, no guarding, no midline pulsatile mass, no hepatomegaly, no splenomegaly EXT:  2 plus pulses throughout, no edema, no cyanosis no clubbing SKIN:  No rashes no nodules NEURO:  Cranial nerves II through XII grossly intact, motor grossly intact throughout PSYCH:  Cognitively intact, oriented to person place and time    EKG:  EKG is ordered today. The ekg ordered today demonstrates sinus rhythm.  Rate 90 bpm.     Recent Labs: 02/22/2015: ALT 17; BUN 17; Creatinine, Ser 1.59*; Hemoglobin 14.2; Platelets 95*; Potassium 3.3*; Sodium 138    Lipid Panel    Component Value Date/Time   CHOL 206* 11/07/2013 1308   TRIG 206* 11/07/2013 1308   HDL 42 11/07/2013 1308   CHOLHDL 4.9 11/07/2013 1308   VLDL 41* 11/07/2013 1308   LDLCALC 123* 11/07/2013 1308      Wt Readings from Last 3 Encounters:  03/16/15 99.479 kg (219 lb 5 oz)  02/27/15 96.217 kg (212 lb 1.9 oz)  02/21/15 100.699 kg (222 lb)  ASSESSMENT AND PLAN:  # Dizziness: Daniel [name] dizziness seems to be related to BPPV.  The fact that he is dizzy when laying down is not consistent with orthostatic hypotension.  Orthostatic vital signs were checked in clinic today and he was mildly orthostatic.  We discussed the importance of adequate hydration, liberalizing salt in take and wearing compression stockings.  I recommended that he consider physical therapy for BPPV, and he was uninterested.  We will also check carotid Dopplers and an echocardiogram.  Given his history of recent DVT, we will use the echo to assess for evidence of right heart failure or PE.  # Hyperlipidemia: Continue atorvastatin.  LFTs were within normal limits 02/22/15.  # DVT: Recently diagnosed recurrent, unprovoked DVT. Lifelong Xarelto.   Current medicines are reviewed at length with the patient today.  The patient does not have concerns regarding medicines.  The following changes have been made:  no change  Labs/ tests ordered today include:   Orders Placed This Encounter  Procedures  . US Carotid Duplex Bilateral  . EKG 12-Lead  . ECHOCARDIOGRAM COMPLETE     Disposition:   FU with Alynn Ellithorpe C. [name], MD, [facility] in 3 months.   This note was written with the assistance of speech recognition software.  Please excuse any transcriptional errors.  Signed, Lilibeth Opie C. [name], MD, [facility]  03/17/2015 12:13 PM    [place]

## 2015-03-17 ENCOUNTER — Encounter: Payer: Self-pay | Admitting: Cardiovascular Disease

## 2015-03-17 DIAGNOSIS — R42 Dizziness and giddiness: Secondary | ICD-10-CM | POA: Insufficient documentation

## 2015-03-17 HISTORY — DX: Dizziness and giddiness: R42

## 2015-03-18 ENCOUNTER — Ambulatory Visit (INDEPENDENT_AMBULATORY_CARE_PROVIDER_SITE_OTHER): Payer: BLUE CROSS/BLUE SHIELD | Admitting: Emergency Medicine

## 2015-03-18 VITALS — BP 124/60 | HR 90 | Temp 98.2°F | Resp 18 | Ht 70.5 in | Wt 220.0 lb

## 2015-03-18 DIAGNOSIS — H8113 Benign paroxysmal vertigo, bilateral: Secondary | ICD-10-CM | POA: Diagnosis not present

## 2015-03-18 DIAGNOSIS — I82409 Acute embolism and thrombosis of unspecified deep veins of unspecified lower extremity: Secondary | ICD-10-CM | POA: Diagnosis not present

## 2015-03-18 DIAGNOSIS — E0849 Diabetes mellitus due to underlying condition with other diabetic neurological complication: Secondary | ICD-10-CM

## 2015-03-18 DIAGNOSIS — M7989 Other specified soft tissue disorders: Secondary | ICD-10-CM | POA: Diagnosis not present

## 2015-03-18 LAB — GLUCOSE, POCT (MANUAL RESULT ENTRY): POC Glucose: 122 mg/dl — AB (ref 70–99)

## 2015-03-18 MED ORDER — MECLIZINE HCL 25 MG PO TABS: ORAL_TABLET | ORAL | Status: DC

## 2015-03-18 NOTE — Progress Notes (Signed)
By signing my name below, I, [name], attest that this documentation has been prepared under the direction and in the presence of Nena Jordan, MD. Electronically Signed: [name], ER Scribe. 03/18/2015. 3:01 PM.  Chief Complaint:  Chief Complaint  Patient presents with  . Depression  . Leg Swelling    DVT right leg/ on Xarelto c/o swelling    HPI: Daniel [name] is a 60 y.o. male with a past hx of depression, DM, HIV, DVT and PE who reports to [facility] today complaining of continued dizziness. His dizziness has been better since he has been taking meclizine regularly. He has not been checking his blood sugar. Goes to the [facility] clinic for his HIV treatment.   He is continuing to have lower extremity edema, which has been ongoing for the last month.   Past Medical History  Diagnosis Date  . Diabetes mellitus without complication ([place])   . ADHD (attention deficit hyperactivity disorder)   . Depression   . GERD (gastroesophageal reflux disease)   . HIV infection ([place])   . Ulcer   . Anxiety   . Clotting disorder ([place])   . Dizziness 03/17/2015   Past Surgical History  Procedure Laterality Date  . Small intestine surgery     Social History   Social History  . Marital Status: Single    Spouse Name: N/A  . Number of Children: N/A  . Years of Education: N/A   Social History Main Topics  . Smoking status: Former Smoker -- 0.10 packs/day for 10 years    Types: Cigars, Cigarettes  . Smokeless tobacco: Never Used  . Alcohol Use: No  . Drug Use: No  . Sexual Activity:    Partners: Male     Comment: pt. declined condoms   Other Topics Concern  . None   Social History Narrative   Family History  Problem Relation Age of Onset  . Cancer Brother   . COPD Mother    Allergies  Allergen Reactions  . Sustiva [Efavirenz] Rash   Prior to Admission medications   Medication Sig Start Date End Date Taking? Authorizing Provider  abacavir-dolutegravir-lamiVUDine  (TRIUMEQ) 600-50-300 MG tablet Take 1 tablet by mouth daily. 03/14/15  Yes Thayer Headings, MD  ALPRAZolam Duanne Moron) 1 MG tablet Take 1 mg by mouth 3 (three) times daily as needed for anxiety.    Yes Historical Provider, MD  amphetamine-dextroamphetamine (ADDERALL) 30 MG tablet Take 30 mg by mouth 3 (three) times daily.    Yes Historical Provider, MD  atorvastatin (LIPITOR) 10 MG tablet TAKE ONE TABLET BY MOUTH ONCE DAILY. 01/12/15  Yes Tishira R Brewington, PA-C  Blood Glucose Monitoring Suppl (BLOOD GLUCOSE METER KIT AND SUPPLIES) Dispense based on patient and insurance preference. Use up to four times daily as directed. (FOR ICD-9 250.00, 250.01). 09/08/13  Yes [name], MD  glucose blood test strip Test blood sugar daily. Dx E11.9 01/10/15  Yes Tishira R Brewington, PA-C  LANTUS SOLOSTAR 100 UNIT/ML Solostar Pen INJECT 30 UNITS SUBCUTANEOUSLY AT BEDTIME. 03/01/15  Yes [name], MD  levothyroxine (SYNTHROID, LEVOTHROID) 75 MCG tablet Take 75 mcg by mouth at bedtime.   Yes Historical Provider, MD  meclizine (ANTIVERT) 25 MG tablet One half tablet 3 times a day as needed for dizziness 02/14/15  Yes [name], MD  oxyCODONE (OXY IR/ROXICODONE) 5 MG immediate release tablet Take 0.5 tablets (2.5 mg total) by mouth every 6 (six) hours as needed. Pain.  02/14/15  Yes [name], MD  pregabalin (LYRICA) 75 MG capsule Take 1 capsule (75 mg total) by mouth 3 (three) times daily. 09/27/14  Yes [name], MD  prochlorperazine (COMPAZINE) 10 MG tablet TAKE ONE TABLET BY MOUTH EVERY 6 HOURS AS NEEDED. 11/23/14  Yes Thayer Headings, MD  Ranitidine HCl (RANITIDINE 75 PO) Take 1 tablet by mouth daily as needed (heartburn).    Yes Historical Provider, MD  rivaroxaban (XARELTO) 20 MG TABS tablet Take 1 tablet (20 mg total) by mouth daily. 03/13/15  Yes [name], MD  TRINTELLIX 20 MG TABS Take 20 mg by mouth at bedtime. 01/26/15  Yes Historical Provider, MD  UNABLE TO FIND CPAP MACHINE with standard  Aclaim nasal mask with humidifier. Set at 14 cwp 04/12/13  Yes [name], MD  VOLTAREN 1 % GEL APPLY 2 GRAMS TO EACH KNEE IN THE MORNING AND AT BEDTIME AND APPLY 1 GRAM TO EACH KNEE IN THE AFTERNOON. 02/23/15  Yes [name], MD  zolpidem (AMBIEN) 10 MG tablet Take 10-20 mg by mouth at bedtime as needed for sleep.    Yes Historical Provider, MD     ROS: The patient denies fevers, chills, night sweats, unintentional weight loss, chest pain, palpitations, wheezing, dyspnea on exertion, nausea, vomiting, abdominal pain, dysuria, hematuria, melena, numbness, weakness, or tingling.   All other systems have been reviewed and were otherwise negative with the exception of those mentioned in the HPI and as above.    PHYSICAL EXAM: Filed Vitals:   03/18/15 1433  BP: 124/60  Pulse: 90  Temp: 98.2 F (36.8 C)  Resp: 18   Body mass index is 31.11 kg/(m^2).   General: Alert, no acute distress HEENT:  Normocephalic, atraumatic, oropharynx patent. Eye: [facility] Cardiovascular:  Regular rate and rhythm, no rubs murmurs or gallops.  No Carotid bruits, radial pulse intact.  Respiratory: Clear to auscultation bilaterally.  No wheezes, rales, or rhonchi.  No cyanosis, no use of accessory musculature Abdominal: No organomegaly, abdomen is soft and non-tender, positive bowel sounds.  No masses. Musculoskeletal: Gait intact.tenderness there is swelling of the right calf. Skin: No rashes. Neurologic: Facial musculature symmetric. Psychiatric: Patient acts appropriately throughout our interaction. Lymphatic: No cervical or submandibular lymphadenopathy   LABS: Results for orders placed or performed in visit on 03/18/15  POCT glucose (manual entry)  Result Value Ref Range   POC Glucose 122 (A) 70 - 99 mg/dl    EKG/XRAY:   Primary read interpreted by Dr. Everlene Farrier at [facility].   ASSESSMENT/PLAN:  Sugar is  good. I refilled his meclizine. I advised him to get on You Tube and go through the  exercises used for vertigo. We'll recheck in one month. He does have persistent swelling in the right calf and will consider repeat Doppler study in the future. I personally performed the services described in this documentation, which was scribed in my presence. The recorded information has been reviewed and is accurate.  Gross sideeffects, risk and benefits, and alternatives of medications d/w patient. Patient is aware that all medications have potential sideeffects and we are unable to predict every sideeffect or drug-drug interaction that may occur.  Arlyss Queen MD 03/18/2015 3:01 PM

## 2015-03-21 ENCOUNTER — Ambulatory Visit (HOSPITAL_COMMUNITY): Payer: BLUE CROSS/BLUE SHIELD

## 2015-03-21 ENCOUNTER — Ambulatory Visit (HOSPITAL_COMMUNITY): Admission: RE | Admit: 2015-03-21 | Payer: BLUE CROSS/BLUE SHIELD | Source: Ambulatory Visit

## 2015-03-23 ENCOUNTER — Ambulatory Visit (HOSPITAL_COMMUNITY)
Admission: RE | Admit: 2015-03-23 | Discharge: 2015-03-23 | Disposition: A | Discharge status: Home / Self Care | Payer: BLUE CROSS/BLUE SHIELD | Source: Ambulatory Visit | Attending: Cardiovascular Disease | Admitting: Cardiovascular Disease

## 2015-03-23 DIAGNOSIS — R42 Dizziness and giddiness: Secondary | ICD-10-CM | POA: Insufficient documentation

## 2015-03-27 ENCOUNTER — Telehealth: Payer: Self-pay | Admitting: *Deleted

## 2015-03-27 NOTE — Telephone Encounter (Signed)
Left  Detailed message to patient on secure  voice mail. Release to my chart.

## 2015-03-27 NOTE — Telephone Encounter (Signed)
-----   Message from Skeet Latch, MD sent at 03/26/2015  4:03 PM EST ----- Very small amount of plaque.  Continue cholesterol medication.

## 2015-03-28 ENCOUNTER — Other Ambulatory Visit: Payer: BLUE CROSS/BLUE SHIELD

## 2015-03-29 ENCOUNTER — Encounter: Payer: Self-pay | Admitting: Cardiovascular Disease

## 2015-03-29 ENCOUNTER — Telehealth: Payer: Self-pay

## 2015-03-29 NOTE — Telephone Encounter (Signed)
Pt is seeking Rx refill for syringe needles for    Original Order:  Insulin Glargine (LANTUS SOLOSTAR) 100 UNIT/ML Solostar Pen [[id number]]         Pharmacy:  [place], [place]

## 2015-03-30 ENCOUNTER — Other Ambulatory Visit: Payer: Self-pay | Admitting: Emergency Medicine

## 2015-03-30 MED ORDER — "PEN NEEDLES 3/16"" 31G X 5 MM MISC": Status: DC

## 2015-03-30 NOTE — Telephone Encounter (Signed)
Pt would like call back when Rx is ready for pick up or consultation if this can not be ordered// Pt also asked that Dr. Everlene Farrier be CC'd on this request    [phone]

## 2015-03-30 NOTE — Telephone Encounter (Signed)
Pt said a rx was sent over from pharmacy for a refill on Oxycodone and Lyrica.  Please advise  [phone]

## 2015-03-30 NOTE — Telephone Encounter (Signed)
Sent in Rx and notified pt on VM.

## 2015-03-31 ENCOUNTER — Other Ambulatory Visit: Payer: Self-pay | Admitting: Emergency Medicine

## 2015-03-31 NOTE — Telephone Encounter (Signed)
Prescription ready for pick up

## 2015-04-04 ENCOUNTER — Other Ambulatory Visit: Payer: BLUE CROSS/BLUE SHIELD

## 2015-04-05 ENCOUNTER — Other Ambulatory Visit: Payer: Self-pay

## 2015-04-05 ENCOUNTER — Ambulatory Visit (INDEPENDENT_AMBULATORY_CARE_PROVIDER_SITE_OTHER): Payer: BLUE CROSS/BLUE SHIELD

## 2015-04-05 ENCOUNTER — Other Ambulatory Visit: Payer: Self-pay | Admitting: Cardiovascular Disease

## 2015-04-05 DIAGNOSIS — R0602 Shortness of breath: Secondary | ICD-10-CM

## 2015-04-05 DIAGNOSIS — I82401 Acute embolism and thrombosis of unspecified deep veins of right lower extremity: Secondary | ICD-10-CM

## 2015-04-05 DIAGNOSIS — R42 Dizziness and giddiness: Secondary | ICD-10-CM

## 2015-04-24 ENCOUNTER — Ambulatory Visit (INDEPENDENT_AMBULATORY_CARE_PROVIDER_SITE_OTHER): Payer: BLUE CROSS/BLUE SHIELD | Admitting: Emergency Medicine

## 2015-04-24 ENCOUNTER — Ambulatory Visit (INDEPENDENT_AMBULATORY_CARE_PROVIDER_SITE_OTHER): Payer: BLUE CROSS/BLUE SHIELD

## 2015-04-24 VITALS — BP 110/84 | HR 93 | Temp 98.6°F | Resp 16 | Ht 70.0 in | Wt 220.6 lb

## 2015-04-24 DIAGNOSIS — M5489 Other dorsalgia: Secondary | ICD-10-CM | POA: Diagnosis not present

## 2015-04-24 DIAGNOSIS — R739 Hyperglycemia, unspecified: Secondary | ICD-10-CM | POA: Diagnosis not present

## 2015-04-24 DIAGNOSIS — M25511 Pain in right shoulder: Secondary | ICD-10-CM | POA: Diagnosis not present

## 2015-04-24 DIAGNOSIS — G8929 Other chronic pain: Secondary | ICD-10-CM

## 2015-04-24 LAB — GLUCOSE, POCT (MANUAL RESULT ENTRY): POC Glucose: 120 mg/dl — AB (ref 70–99)

## 2015-04-24 MED ORDER — OXYCODONE HCL 5 MG PO TABS: 5.0000 mg | ORAL_TABLET | Freq: Two times a day (BID) | ORAL | Status: DC

## 2015-04-24 NOTE — Patient Instructions (Addendum)
Because you received an x-ray today, you will receive an invoice from [facility]. Please contact [facility] at [phone] with questions or concerns regarding your invoice. Our billing staff will not be able to assist you with those questions. You had been given a prescription for 45 pain pills. Referral has been made to pain management to help with your chronic pain.

## 2015-04-24 NOTE — Progress Notes (Addendum)
Patient ID: Daniel [name], male   DOB: 1955/10/20, 60 y.o.   MRN: [id number]     By signing my name below, I, Zola Button, attest that this documentation has been prepared under the direction and in the presence of Arlyss Queen, MD.  Electronically Signed: Zola Button, Medical Scribe. 04/24/2015. 11:33 AM.   Chief Complaint:  Chief Complaint  Patient presents with  . Follow-up    sleep apnea,diabetes    HPI: Daniel [name] is a 60 y.o. male with a history of DM, depression, chronic renal insufficiency stage III, and HIV who reports to [facility] today for a follow-up. His blood sugar was 122 yesterday. He states his kidney disease has been stable.  Patient is still having upper and lower back pain and is wondering if he has had any recent XR imaging. He states he needs more than 30 oxycodone a month. He had a CXR on 02/21/15 which was normal.  He is on a CPAP machine.  Past Medical History  Diagnosis Date  . Diabetes mellitus without complication ([place])   . ADHD (attention deficit hyperactivity disorder)   . Depression   . GERD (gastroesophageal reflux disease)   . HIV infection ([place])   . Ulcer   . Anxiety   . Clotting disorder ([place])   . Dizziness 03/17/2015   Past Surgical History  Procedure Laterality Date  . Small intestine surgery     Social History   Social History  . Marital Status: Single    Spouse Name: N/A  . Number of Children: N/A  . Years of Education: N/A   Social History Main Topics  . Smoking status: Former Smoker -- 0.10 packs/day for 10 years    Types: Cigars, Cigarettes  . Smokeless tobacco: Never Used  . Alcohol Use: No  . Drug Use: No  . Sexual Activity:    Partners: Male     Comment: pt. declined condoms   Other Topics Concern  . None   Social History Narrative   Epworth Sleepiness Scale = 7 (as of 03/16/2015)   Family History  Problem Relation Age of Onset  . Cancer Brother   . COPD Mother    Allergies  Allergen Reactions  . Sustiva  [Efavirenz] Rash   Prior to Admission medications   Medication Sig Start Date End Date Taking? Authorizing Provider  abacavir-dolutegravir-lamiVUDine (TRIUMEQ) 600-50-300 MG tablet Take 1 tablet by mouth daily. 03/14/15  Yes Thayer Headings, MD  ALPRAZolam Duanne Moron) 1 MG tablet Take 1 mg by mouth 3 (three) times daily as needed for anxiety.    Yes Historical Provider, MD  amphetamine-dextroamphetamine (ADDERALL) 30 MG tablet Take 30 mg by mouth 3 (three) times daily.    Yes Historical Provider, MD  atorvastatin (LIPITOR) 10 MG tablet TAKE ONE TABLET BY MOUTH ONCE DAILY. 01/12/15  Yes Tishira R Brewington, PA-C  Blood Glucose Monitoring Suppl (BLOOD GLUCOSE METER KIT AND SUPPLIES) Dispense based on patient and insurance preference. Use up to four times daily as directed. (FOR ICD-9 250.00, 250.01). 09/08/13  Yes [name], MD  glucose blood test strip Test blood sugar daily. Dx E11.9 01/10/15  Yes Tishira R Brewington, PA-C  Insulin Pen Needle (PEN NEEDLES 3/16") 31G X 5 MM MISC Use to inject Lantus daily. 03/30/15  Yes [name], MD  LANTUS SOLOSTAR 100 UNIT/ML Solostar Pen INJECT 30 UNITS SUBCUTANEOUSLY AT BEDTIME. 03/01/15  Yes [name], MD  levothyroxine (SYNTHROID, LEVOTHROID) 75 MCG tablet Take 75 mcg by  mouth at bedtime.   Yes Historical Provider, MD  LYRICA 75 MG capsule TAKE ONE CAPSULE BY MOUTH THREE DAILY. 03/31/15  Yes [name], MD  meclizine (ANTIVERT) 25 MG tablet One half tablet 3 times a day as needed for dizziness 03/18/15  Yes [name], MD  oxyCODONE (OXY IR/ROXICODONE) 5 MG immediate release tablet TAKE 1/2 TABLET BY MOUTH EVERY 6 HOURS AS NEEDED FOR PAIN. 03/31/15  Yes [name], MD  prochlorperazine (COMPAZINE) 10 MG tablet TAKE ONE TABLET BY MOUTH EVERY 6 HOURS AS NEEDED. 11/23/14  Yes Thayer Headings, MD  Ranitidine HCl (RANITIDINE 75 PO) Take 1 tablet by mouth daily as needed (heartburn).    Yes Historical Provider, MD  rivaroxaban (XARELTO) 20 MG TABS tablet Take  1 tablet (20 mg total) by mouth daily. 03/13/15  Yes [name], MD  TRINTELLIX 20 MG TABS Take 20 mg by mouth at bedtime. 01/26/15  Yes Historical Provider, MD  UNABLE TO FIND CPAP MACHINE with standard Aclaim nasal mask with humidifier. Set at 14 cwp 04/12/13  Yes [name], MD  VOLTAREN 1 % GEL APPLY 2 GRAMS TO EACH KNEE IN THE MORNING AND AT BEDTIME AND APPLY 1 GRAM TO EACH KNEE IN THE AFTERNOON. 02/23/15  Yes [name], MD  zolpidem (AMBIEN) 10 MG tablet Take 10-20 mg by mouth at bedtime as needed for sleep.    Yes Historical Provider, MD     ROS: The patient denies fevers, chills, night sweats, unintentional weight loss, chest pain, palpitations, wheezing, dyspnea on exertion, nausea, vomiting, abdominal pain, dysuria, hematuria, melena.  All other systems have been reviewed and were otherwise negative with the exception of those mentioned in the HPI and as above.    PHYSICAL EXAM: Filed Vitals:   04/24/15 1053  BP: 110/84  Pulse: 93  Temp: 98.6 F (37 C)  Resp: 16   Body mass index is 31.65 kg/(m^2).   General: Alert, no acute distress HEENT:  Normocephalic, atraumatic, oropharynx patent. Poor dentition. Eye: [facility] Cardiovascular:  Regular rate and rhythm, no rubs murmurs or gallops.  No Carotid bruits, radial pulse intact. No pedal edema.  Respiratory: Clear to auscultation bilaterally.  No wheezes, rales, or rhonchi.  No cyanosis, no use of accessory musculature Abdominal: No organomegaly, abdomen is soft and non-tender, positive bowel sounds.  No masses. Musculoskeletal: Tender lateral right mid chest wall, mild tenderness perithoracic and peri-lumbar spine. Significant degenerative changes of both knees. Skin: No rashes. Neurologic: Facial musculature symmetric. Decreased sensation both feet. Knee reflexes normal. Psychiatric: Patient acts appropriately throughout our interaction. Lymphatic: No cervical or submandibular  lymphadenopathy    LABS: Results for orders placed or performed in visit on 04/24/15  POCT glucose (manual entry)  Result Value Ref Range   POC Glucose 120 (A) 70 - 99 mg/dl   when he retired this summer   EKG/XRAY:   Primary read interpreted by Dr. Everlene Farrier at [facility]. Dg Thoracic Spine 2 View  04/24/2015  CLINICAL DATA:  Periscapular pain EXAM: THORACIC SPINE 2 VIEWS COMPARISON:  Chest x-ray of 02/21/2015 FINDINGS: The thoracic vertebrae are in normal alignment. Only mild degenerative spurring is present. No compression deformity is seen. No prominent paravertebral soft tissue is noted. IMPRESSION: Mild degenerative change in the mid to lower thoracic spine. No acute compression deformity. Electronically Signed   By: Ivar Drape M.D.   On: 04/24/2015 12:13   Dg Lumbar Spine 2-3 Views  04/24/2015  CLINICAL DATA:  Back pain EXAM: LUMBAR SPINE - 2-3 VIEW COMPARISON:  None. FINDINGS: The lumbar vertebrae are in normal alignment. Intervertebral disc spaces appear normal. The SI joints appear corticated. The bowel gas pattern is nonspecific. IMPRESSION: Normal alignment.  Normal intervertebral disc spaces. Electronically Signed   By: Ivar Drape M.D.   On: 04/24/2015 12:12    ASSESSMENT/PLAN: Back films are essentially unremarkable. He was given #45 of the oxycodone he is on. He cannot take any Tylenol because of liver issues. Referral made to pain management. Patient advised we will always be able to write medication until he is established with pain management. I personally performed the services described in this documentation, which was scribed in my presence. The recorded information has been reviewed and is accurate. It has been almost 10 years since patient had evaluation for sleep apnea. Referral made back to Dr. Brett Fairy for her help. He has significant fatigue during the day.   Gross sideeffects, risk and benefits, and alternatives of medications d/w patient. Patient is aware that all  medications have potential sideeffects and we are unable to predict every sideeffect or drug-drug interaction that may occur.  Arlyss Queen MD 04/24/2015 11:33 AM

## 2015-04-24 NOTE — Addendum Note (Signed)
Addended by: Arlyss Queen A on: 04/24/2015 12:34 PM   Modules accepted: Orders

## 2015-04-24 NOTE — Addendum Note (Signed)
Addended by: Arlyss Queen A on: 04/24/2015 12:27 PM   Modules accepted: Orders

## 2015-05-01 DIAGNOSIS — H8111 Benign paroxysmal vertigo, right ear: Secondary | ICD-10-CM | POA: Insufficient documentation

## 2015-05-08 ENCOUNTER — Other Ambulatory Visit: Payer: Self-pay | Admitting: Emergency Medicine

## 2015-05-10 ENCOUNTER — Telehealth: Payer: Self-pay | Admitting: Emergency Medicine

## 2015-05-10 ENCOUNTER — Other Ambulatory Visit: Payer: Self-pay | Admitting: Emergency Medicine

## 2015-05-10 ENCOUNTER — Telehealth: Payer: Self-pay | Admitting: Neurology

## 2015-05-10 DIAGNOSIS — M21379 Foot drop, unspecified foot: Secondary | ICD-10-CM

## 2015-05-10 DIAGNOSIS — R2689 Other abnormalities of gait and mobility: Principal | ICD-10-CM

## 2015-05-10 DIAGNOSIS — R269 Unspecified abnormalities of gait and mobility: Secondary | ICD-10-CM

## 2015-05-10 NOTE — Telephone Encounter (Signed)
Patient is  Requesting a referral to a foot doctor, b/c he is dragging his foot.   Also, when he takes one step forward, he goes backwards two steps.  When he is finally gets going in the forward motion, he runs into the wall.   [phone]

## 2015-05-10 NOTE — Telephone Encounter (Signed)
Dr. Everlene Farrier  Please see previous message

## 2015-05-10 NOTE — Telephone Encounter (Signed)
Pt called sts he has an appt with Dr Brett Fairy on Monday 05/10/15 for sleep. Today there was a second referral sent from Dr Everlene Farrier for him to be seen for unsteady gait worsening peripheral neuropathy and intermittent episodes of dizziness. He sts if all the issues can not be addressed at one appt he wants to be seen for the latter. Can the appt be changed from sleep consult??

## 2015-05-10 NOTE — Telephone Encounter (Signed)
Ca he see another MD for neuropathy- i do SLEEP>

## 2015-05-10 NOTE — Telephone Encounter (Signed)
Phone call from patient that he is having increasing difficulty with gait. His dizziness has improved. He is due to see Dr. Roddie Mc on Monday. I will forward her a copy of this note so she can help evaluate his gait disorder and neuropathy.

## 2015-05-10 NOTE — Telephone Encounter (Signed)
Pt would rather be seen for dizziness and peripheral neuropathy at his appt with you on 3/20 instead of the sleep consult for sleep apnea as scheduled. Do you think all of this can be addressed or should he make a later sleep consult appt?

## 2015-05-10 NOTE — Telephone Encounter (Signed)
Phone call from patient stating he was having difficulty with unsteady gait. His dizziness has resolved. He is due to see his neurologist on Monday. I will forward a copy of this to her so she can proceed with evaluation of his gait disorder.

## 2015-05-11 ENCOUNTER — Other Ambulatory Visit: Payer: Self-pay | Admitting: Emergency Medicine

## 2015-05-11 ENCOUNTER — Telehealth: Payer: Self-pay | Admitting: *Deleted

## 2015-05-11 ENCOUNTER — Other Ambulatory Visit: Payer: BLUE CROSS/BLUE SHIELD

## 2015-05-11 DIAGNOSIS — R269 Unspecified abnormalities of gait and mobility: Secondary | ICD-10-CM

## 2015-05-11 NOTE — Telephone Encounter (Signed)
Pt scheduled for Sunday for MRI

## 2015-05-11 NOTE — Telephone Encounter (Signed)
I called pt.  I LMVM for him that appt set for him on Monday, 05-14-15 is for sleep (since first referral).   He can see another MD for PN after this.  He is to call back to discuss.

## 2015-05-11 NOTE — Telephone Encounter (Signed)
Call patient. I am scheduling a MRI scan to be sure you have not had a stroke in the posterior circulation

## 2015-05-12 ENCOUNTER — Telehealth: Payer: Self-pay

## 2015-05-12 NOTE — Telephone Encounter (Signed)
Please call and check with patient. I spoke with him yesterday and he was going to see Dr. Brett Fairy at noon. Please see if he did this. Be sure she wants him to go ahead with his MRI on Sunday. Be sure the MRI is to be done without contrast.

## 2015-05-13 ENCOUNTER — Ambulatory Visit (HOSPITAL_BASED_OUTPATIENT_CLINIC_OR_DEPARTMENT_OTHER)
Admission: RE | Admit: 2015-05-13 | Discharge: 2015-05-13 | Disposition: A | Discharge status: Home / Self Care | Payer: BLUE CROSS/BLUE SHIELD | Source: Ambulatory Visit | Attending: Emergency Medicine | Admitting: Emergency Medicine

## 2015-05-13 DIAGNOSIS — R269 Unspecified abnormalities of gait and mobility: Secondary | ICD-10-CM | POA: Diagnosis present

## 2015-05-13 NOTE — Telephone Encounter (Signed)
Patient needs to get his neurological symptoms resolved before we focus on problems with his knee

## 2015-05-13 NOTE — Telephone Encounter (Signed)
Spoke with patient advised Dr. Everlene Farrier will be her Tuesday and Wednesday hours given he can come see him for his knee.  Patient understood

## 2015-05-13 NOTE — Telephone Encounter (Signed)
See previous note

## 2015-05-14 ENCOUNTER — Telehealth: Payer: Self-pay

## 2015-05-14 ENCOUNTER — Other Ambulatory Visit: Payer: Self-pay | Admitting: Emergency Medicine

## 2015-05-14 ENCOUNTER — Encounter: Payer: Self-pay | Admitting: Neurology

## 2015-05-14 ENCOUNTER — Institutional Professional Consult (permissible substitution): Payer: BLUE CROSS/BLUE SHIELD | Admitting: Neurology

## 2015-05-14 DIAGNOSIS — M170 Bilateral primary osteoarthritis of knee: Secondary | ICD-10-CM

## 2015-05-14 NOTE — Telephone Encounter (Signed)
Pt did not show for their appt with Dr. Dohmeier today.  

## 2015-05-15 ENCOUNTER — Encounter: Payer: Self-pay | Admitting: Neurology

## 2015-05-15 ENCOUNTER — Telehealth: Payer: Self-pay

## 2015-05-15 NOTE — Telephone Encounter (Signed)
Just be sure I am scheduled to work 8-1 on 06/01/2015

## 2015-05-15 NOTE — Telephone Encounter (Signed)
PA completed for diclofenac gel on covermymeds. Pt has chronic renal insufficiency and is not on oral NSAIDS. Pending.

## 2015-05-15 NOTE — Telephone Encounter (Signed)
Called in [facility]

## 2015-05-15 NOTE — Telephone Encounter (Signed)
PA approved through 02/23/2038. Notified pharm.

## 2015-05-15 NOTE — Telephone Encounter (Signed)
Pt. States his appointment w/foot doctor is not until 3/30 he will come to [facility] 04/07 for walk-in between 8-1- pt wants to make sure all records are available from other providers. If this will not be good for follow up please contact...   [phone] please call if necessary

## 2015-05-16 DIAGNOSIS — Z0289 Encounter for other administrative examinations: Secondary | ICD-10-CM

## 2015-05-16 NOTE — Telephone Encounter (Signed)
Yes you are scheduled.

## 2015-05-20 NOTE — Telephone Encounter (Signed)
completed

## 2015-05-21 ENCOUNTER — Ambulatory Visit (INDEPENDENT_AMBULATORY_CARE_PROVIDER_SITE_OTHER): Payer: BLUE CROSS/BLUE SHIELD | Admitting: Neurology

## 2015-05-21 ENCOUNTER — Encounter: Payer: Self-pay | Admitting: Neurology

## 2015-05-21 VITALS — BP 150/86 | HR 82 | Resp 20 | Ht 70.5 in | Wt 226.0 lb

## 2015-05-21 DIAGNOSIS — G4733 Obstructive sleep apnea (adult) (pediatric): Secondary | ICD-10-CM | POA: Diagnosis not present

## 2015-05-21 DIAGNOSIS — Z9989 Dependence on other enabling machines and devices: Secondary | ICD-10-CM

## 2015-05-21 NOTE — Patient Instructions (Signed)
Please remember to try to maintain good sleep hygiene, which means: Keep a regular sleep and wake schedule, try not to exercise or have a meal within 2 hours of your bedtime, try to keep your bedroom conducive for sleep, that is, cool and dark, without light distractors such as an illuminated alarm clock, and refrain from watching TV right before sleep or in the middle of the night and do not keep the TV or radio on during the night. Also, try not to use or play on electronic devices at bedtime, such as your cell phone, tablet PC or laptop. If you like to read at bedtime on an electronic device, try to dim the background light as much as possible. Do not eat in the middle of the night.   We will request a sleep study.    We will look for leg twitching and snoring or sleep apnea.   For chronic insomnia, you are best followed by a psychiatrist and/or sleep psychologist.   We will call you with the sleep study results and make a follow up appointment if needed.   Please remember to try to maintain good sleep hygiene, which means: Keep a regular sleep and wake schedule, try not to exercise or have a meal within 2 hours of your bedtime, try to keep your bedroom conducive for sleep, that is, cool and dark, without light distractors such as an illuminated alarm clock, and refrain from watching TV right before sleep or in the middle of the night and do not keep the TV or radio on during the night. Also, try not to use or play on electronic devices at bedtime, such as your cell phone, tablet PC or laptop. If you like to read at bedtime on an electronic device, try to dim the background light as much as possible. Do not eat in the middle of the night.   We will request a sleep study.    We will look for leg twitching and snoring or sleep apnea.   For chronic insomnia, you are best followed by a psychiatrist and/or sleep psychologist.   We will call you with the sleep study results and make a follow up  appointment if needed.

## 2015-05-21 NOTE — Progress Notes (Signed)
SLEEP MEDICINE CLINIC   Provider:  Larey [name], M D  Referring Provider: [name], MD Primary Care Physician:  Daniel Reichmann, MD  Chief Complaint  Patient presents with  . New Patient (Initial Visit)    had sleep study 10-15 years ago,  on cpap, rm 11    HPI:  Daniel [name] is a 60 y.o. male , seen here as a referral from Daniel [name] for a sleep consultation,  Daniel [name] reports that over 10 years ago he had a sleep study and he was prescribed a CPAP machine. He has been using the CPAP ever since about 14 years ago he had is very first sleep study and was the result of the second set left tubes a prescription of his current machine. A [employer] manufactured Sullivan CPAP. By appearance over 41 years old. [facility] that machine that the patient often also had to use mail orders to fill his supply needs. He is definitely due for a new machine. In January 2014 over 3 years ago, he saw Daniel [name] here at the office for a neuropathy evaluation with foot drop. She concluded that this was either an HIV or diabetic induced neuropathy the results are scanned into epic for Daniel [name], at that time also prescribing physician. Daniel [name] has no routines in terms of sleep wake rhythm. 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  Sleep habits are as follows: Some nights Daniel [name] may watch TV all night through another night he may go to bed as early as 8 or 9 PM. There is no established sleep routine. Daniel [name] and his brother lives with her 1 year old aunt. He doesn't want to establish routines. He is not bothered by his living arrangements. His room is bedroom, Tv and computer room.  When he sleeps, he will be in bed by 3 or 4 AM and sleeps 7-8 hours . Eats oatmeal in AM and sometimes returns to bed to sleep there all night and day. He sleeps in all positions, 2 pillowes. His bedroom is not  cool , nor quiet - he used light blocking shades.  He has a illuminated bedroom, doesn't watch  TV in the dark 240 watts.  His psychiatrist is Daniel [name] who has been trying to work on this for years he reports. He is disinterested in most things in life.     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 Social history: Daniel [name] is currently unemployed, he drinks juices but no coffee  beverages, he likes milk and ice cream. He also drinks iced tea which has caffeine. Last year during I montage in [place] he left of a lot of fast food but couldn't get his beloved iced tea, his sugar diabetes decompensated completely. After his return he was placed on insulin.    Review of Systems: Out of a complete 14 system review, the patient complains of only the following symptoms, and all other reviewed systems are negative.  Epworth score 4 , Fatigue severity score 49  , depression score deferred.    Social History   Social History  . Marital Status: Single    Spouse Name: N/A  . Number of Children: N/A  . Years of Education: N/A  Occupational History  . Not on file.   Social History Main Topics  . Smoking status: Former Smoker -- 0.10 packs/day for 10 years    Types: Cigars, Cigarettes  . Smokeless tobacco: Never Used  . Alcohol Use: No  . Drug Use: No  . Sexual Activity:    Partners: Male     Comment: pt. declined condoms   Other Topics Concern  . Not on file   Social History Narrative   Epworth Sleepiness Scale = 7 (as of 03/16/2015)    Family History  Problem Relation Age of Onset  . Cancer Brother   . COPD Mother     Past Medical History  Diagnosis Date  . Diabetes mellitus without complication ([place])   . ADHD (attention deficit hyperactivity disorder)   . Depression   . GERD (gastroesophageal reflux disease)   . HIV infection ([place])   . Ulcer   . Anxiety   . Clotting disorder ([place])   .  Dizziness 03/17/2015    Past Surgical History  Procedure Laterality Date  . Small intestine surgery      Current Outpatient Prescriptions  Medication Sig Dispense Refill  . abacavir-dolutegravir-lamiVUDine (TRIUMEQ) 600-50-300 MG tablet Take 1 tablet by mouth daily. 30 tablet 5  . ALPRAZolam (XANAX) 1 MG tablet Take 1 mg by mouth 3 (three) times daily as needed for anxiety.     [name] amphetamine-dextroamphetamine (ADDERALL) 30 MG tablet Take 30 mg by mouth 3 (three) times daily.     [name] atorvastatin (LIPITOR) 10 MG tablet TAKE ONE TABLET BY MOUTH ONCE DAILY. 90 tablet 11  . Blood Glucose Monitoring Suppl (BLOOD GLUCOSE METER KIT AND SUPPLIES) Dispense based on patient and insurance preference. Use up to four times daily as directed. (FOR ICD-9 250.00, 250.01). 1 each 0  . glucose blood test strip Test blood sugar daily. Dx E11.9 50 each 11  . Insulin Pen Needle (PEN NEEDLES 3/16") 31G X 5 MM MISC Use to inject Lantus daily. 100 each 3  . LANTUS SOLOSTAR 100 UNIT/ML Solostar Pen INJECT 30 UNITS SUBCUTANEOUSLY AT BEDTIME. 15 mL 2  . levothyroxine (SYNTHROID, LEVOTHROID) 75 MCG tablet Take 75 mcg by mouth at bedtime.    [name] LYRICA 75 MG capsule TAKE ONE CAPSULE BY MOUTH THREE DAILY. 90 capsule 0  . oxyCODONE (OXY IR/ROXICODONE) 5 MG immediate release tablet Take 1 tablet (5 mg total) by mouth 2 (two) times daily. 45 tablet 0  . prochlorperazine (COMPAZINE) 10 MG tablet TAKE ONE TABLET BY MOUTH EVERY 6 HOURS AS NEEDED. 120 tablet 0  . Ranitidine HCl (RANITIDINE 75 PO) Take 1 tablet by mouth daily as needed (heartburn).     . rivaroxaban (XARELTO) 20 MG TABS tablet Take 1 tablet (20 mg total) by mouth daily. 30 tablet 5  . TRINTELLIX 20 MG TABS Take 20 mg by mouth at bedtime.    [name] UNABLE TO FIND CPAP MACHINE with standard Aclaim nasal mask with humidifier. Set at 14 cwp 1 each 0  . VOLTAREN 1 % GEL APPLY 2 GRAMS TO EACH KNEE IN THE MORNING AND AT BEDTIME AND APPLY 1 GRAM TO EACH KNEE IN THE AFTERNOON.  300 g 5  . zolpidem (AMBIEN) 10 MG tablet Take 10-20 mg by mouth at bedtime as needed for sleep.      No current facility-administered medications for this visit.    Allergies as of 05/21/2015 - Review Complete 05/21/2015  Allergen Reaction Noted  . Nsaids Other (See Comments) 05/21/2015  .  Sustiva [efavirenz] Rash 06/04/2006    Vitals: BP 150/86 mmHg  Pulse 82  Resp 20  Ht 5' 10.5" (1.791 m)  Wt 226 lb (102.513 kg)  BMI 31.96 kg/m2 Last Weight:  Wt Readings from Last 1 Encounters:  05/21/15 226 lb (102.513 kg)   IOM:BTDH mass index is 31.96 kg/(m^2).     Last Height:   Ht Readings from Last 1 Encounters:  05/21/15 5' 10.5" (1.791 m)    Physical exam:  General: The patient is awake, alert and appears not in acute distress. The patient is not  Groomed. He has full facial hair.  Head: Normocephalic, atraumatic. Neck is supple. Mallampati 4,  neck circumference: 18.5 . Nasal airflow  restricted , TMJ is evident . Retrognathia is not seen.  Cardiovascular:  Regular rate and rhythm , without  murmurs or carotid bruit, and without distended neck veins. Respiratory: Lungs are clear to auscultation. Skin:  Without evidence of edema, or rash Trunk: BMI is elevated . The patient's posture is not erect   Neurologic exam : The patient is awake and alert, oriented to place and time.   Memory subjective described as intact.  Attention span & concentration ability appears normal.  Speech is fluent,  without dysarthria, dysphonia or aphasia.  Mood and affect are appropriate.  Cranial nerves: Pupils are equal and briskly reactive to light. Funduscopic exam without evidence of pallor or edema.  Extraocular movements  in vertical and horizontal planes intact and without nystagmus. Visual fields by finger perimetry are intact. Hearing to finger rub intact. Facial sensation intact to fine touch. Facial motor strength is symmetric and tongue and uvula move midline. Shoulder shrug was  symmetrical.  Motor exam:  Normal tone, muscle bulk and symmetric strength in all extremities. Sensory:  Fine touch, pinprick and vibration were tested in all extremities. Proprioception tested in the upper extremities was normal. Coordination: Rapid alternating movements / Finger-to-nose maneuver normal without evidence of ataxia, dysmetria or tremor. Gait and station: not evaluated. Deep tendon reflexes: in the  upper and lower extremities are symmetric and intact. Babinski maneuver response is downgoing.  The patient was advised of the nature of the diagnosed sleep disorder , the treatment options and risks for general a health and wellness arising from not treating the condition.  I spent more than 45 minutes of face to face time with the patient. Greater than 50% of time was spent in counseling and coordination of care. We have discussed the diagnosis and differential and I answered the patient's questions.     Assessment:  After physical and neurologic examination, review of laboratory studies,  Personal review of imaging studies, reports of other /same  Imaging studies ,  Results of polysomnography/ neurophysiology testing and pre-existing records as far as provided in visit., my assessment is   1) Mr. Aguayo is an established patient of Dr. Toy Care and has seen Daniel [name] in 2014. He has a history of diabetes mellitus chronic depression HIV infection blood clotting disorder foot drop and ADHD. He is followed for chronic renal insufficiency by a nephrologist- his kidney disease has been stable.  He is currently taking Xanax; codeine, Adderall, Lipitor he checks his blood sugars at home he takes Synthroid 75 g, Antivert when necessary was given in February a prescription for oxycodone, has Compazine, ranitidine, Xarelto, 20 Lex, Ambien. His review of system is positive for frequency of urination, heat intolerance, light sensitivity, shortness of breath and coughing, he had recently a DVT which caused  his leg to swell. Has a history of apnea treated with a CPAP machine that is by now very old, and has recurrent daytime sleepiness issues.  2) depression,  Insomnia, ciracadian free flow rhythm- Dr Toy Care.  Mr. Bargo admits to have a nonexisting sleep hygiene routines or rules. It may help him to establish such routines. I would give him a little booklet about insomnia treatment in general but also circadian rhythm disorders.  3) Mr. Favaro carries a diagnosis of sleep apnea but his original sleep study cannot be found nor his original prescription and the machine is no longer downloadable. I would appreciate [facility] help in finding their records of Mr. Nelles CPAP orders. This was here at the [facility] office. I do not know what his original apnea degree was. I will order a split night study. The patient has been compliant with his old CPAP machine ever since it was prescribed. So he holds onto some routines and rolls. I would like for him to have access to  a new machine should he be diagnosed with sleep apnea any degree.    Plan:  Treatment plan and additional workup : I am not too optimistic that Mr.  Buckner will learn to establish routines and sleep hygiene after this has aparently been preached to him for 14 years. He has a history of Apnea without any documentation in epic.  Mr Coach will be followed for sleep, not general neurology- for any general neurology he will need to see dr Krista [name].   Ordered SPLIT night.  Capnography, daytime sleep study.       Asencion Partridge Daylene Vandenbosch MD  05/21/2015   CC: Daniel [name], [address] [place], [place] [address]

## 2015-05-22 ENCOUNTER — Encounter: Payer: Self-pay | Admitting: *Deleted

## 2015-05-24 ENCOUNTER — Encounter: Payer: Self-pay | Admitting: Podiatry

## 2015-05-24 ENCOUNTER — Other Ambulatory Visit: Payer: Self-pay | Admitting: Emergency Medicine

## 2015-05-24 ENCOUNTER — Ambulatory Visit (INDEPENDENT_AMBULATORY_CARE_PROVIDER_SITE_OTHER): Payer: BLUE CROSS/BLUE SHIELD | Admitting: Podiatry

## 2015-05-24 ENCOUNTER — Telehealth: Payer: Self-pay | Admitting: Emergency Medicine

## 2015-05-24 ENCOUNTER — Ambulatory Visit (INDEPENDENT_AMBULATORY_CARE_PROVIDER_SITE_OTHER): Payer: BLUE CROSS/BLUE SHIELD

## 2015-05-24 VITALS — BP 125/77 | HR 94 | Resp 16 | Ht 70.5 in | Wt 222.0 lb

## 2015-05-24 DIAGNOSIS — Q828 Other specified congenital malformations of skin: Secondary | ICD-10-CM

## 2015-05-24 DIAGNOSIS — B351 Tinea unguium: Secondary | ICD-10-CM

## 2015-05-24 DIAGNOSIS — M79609 Pain in unspecified limb: Secondary | ICD-10-CM

## 2015-05-24 DIAGNOSIS — M79676 Pain in unspecified toe(s): Secondary | ICD-10-CM

## 2015-05-24 DIAGNOSIS — E1140 Type 2 diabetes mellitus with diabetic neuropathy, unspecified: Secondary | ICD-10-CM | POA: Diagnosis not present

## 2015-05-24 DIAGNOSIS — E1149 Type 2 diabetes mellitus with other diabetic neurological complication: Secondary | ICD-10-CM

## 2015-05-24 DIAGNOSIS — E119 Type 2 diabetes mellitus without complications: Secondary | ICD-10-CM

## 2015-05-24 DIAGNOSIS — R9089 Other abnormal findings on diagnostic imaging of central nervous system: Secondary | ICD-10-CM

## 2015-05-24 NOTE — Progress Notes (Signed)
   Subjective:    Patient ID: Daniel [name], male    DOB: [birth year], 60 y.o.   MRN: [id number]  HPI Patient presents with bilateral foot pain; dorsal & plantar; x2 yrs.  Patient also presents with bilateral callouses; great toes-medial.  Patient also presents with needing a bilateral nail trim.  Pt is diabetic type 2; sugar=400 yesterday (05/23/15); did not check today; A1C=6.0  Patient is HIV positive   Review of Systems  Constitutional: Positive for chills, fatigue and unexpected weight change.  HENT: Positive for hearing loss and sinus pressure.   Eyes: Positive for itching.  Respiratory: Positive for shortness of breath.   Cardiovascular: Positive for leg swelling.  Gastrointestinal: Positive for constipation.  Genitourinary: Positive for difficulty urinating.  Musculoskeletal: Positive for myalgias, back pain, arthralgias and gait problem.  Neurological: Positive for dizziness, light-headedness, numbness and headaches.  All other systems reviewed and are negative.      Objective:   Physical Exam        Assessment & Plan:

## 2015-05-24 NOTE — Telephone Encounter (Signed)
Dr. Everlene Farrier,  Pt. Req. Call back from you at [phone].  Thank you.

## 2015-05-25 NOTE — Progress Notes (Signed)
Subjective:     Patient ID: Daniel [name], male   DOB: 05-22-1955, 60 y.o.   MRN: [id number]  HPI patient presents poor health individual with calluses that are painful nails that are thickened and incurvated and several year history of diabetes that he has not been able to control with burning in his feet   Review of Systems  All other systems reviewed and are negative.      Objective:   Physical Exam  Constitutional: He is oriented to person, place, and time.  Cardiovascular: Intact distal pulses.   Musculoskeletal: Normal range of motion.  Neurological: He is oriented to person, place, and time.  Skin: Skin is warm and dry.  Nursing note and vitals reviewed.  vascular status found to be intact with neurological status which is diminished and diminished sharp Dole vibratory noted. Patient is found to have thick nailbeds 1-5 both feet with incurvation in the corners that he has tried to cut himself along with lesions on the hallux of both feet inside that are painful when pressed with diabetes as a complicating factor     Assessment:     I chronic nail infection with pain diabetes at risk condition and lesion formation bilateral that he cannot take care of with pain    Plan:     H&P and all conditions reviewed with patient. At this point I debrided nailbeds 1-5 both feet and lesions on the hallux and applied thick pad to try to reduce pressure. I explained that symptoms are most likely going to reoccur and that we want to follow him regular and he'll be seen back in 3 months. I reviewed diabetic education and the importance of watching these and that if he can get his sugar under better control it will be of benefit to him

## 2015-06-01 ENCOUNTER — Ambulatory Visit (INDEPENDENT_AMBULATORY_CARE_PROVIDER_SITE_OTHER): Payer: BLUE CROSS/BLUE SHIELD | Admitting: Emergency Medicine

## 2015-06-01 VITALS — BP 132/78 | HR 84 | Temp 98.1°F | Resp 16 | Ht 70.0 in | Wt 227.0 lb

## 2015-06-01 DIAGNOSIS — R945 Abnormal results of liver function studies: Secondary | ICD-10-CM

## 2015-06-01 DIAGNOSIS — R9089 Other abnormal findings on diagnostic imaging of central nervous system: Secondary | ICD-10-CM

## 2015-06-01 DIAGNOSIS — M21379 Foot drop, unspecified foot: Secondary | ICD-10-CM

## 2015-06-01 DIAGNOSIS — H8113 Benign paroxysmal vertigo, bilateral: Secondary | ICD-10-CM

## 2015-06-01 DIAGNOSIS — R261 Paralytic gait: Secondary | ICD-10-CM

## 2015-06-01 DIAGNOSIS — R930 Abnormal findings on diagnostic imaging of skull and head, not elsewhere classified: Secondary | ICD-10-CM | POA: Diagnosis not present

## 2015-06-01 DIAGNOSIS — R739 Hyperglycemia, unspecified: Secondary | ICD-10-CM | POA: Diagnosis not present

## 2015-06-01 DIAGNOSIS — R7989 Other specified abnormal findings of blood chemistry: Secondary | ICD-10-CM | POA: Diagnosis not present

## 2015-06-01 DIAGNOSIS — R2689 Other abnormalities of gait and mobility: Principal | ICD-10-CM

## 2015-06-01 MED ORDER — OXYCODONE HCL 5 MG PO TABS: 5.0000 mg | ORAL_TABLET | Freq: Two times a day (BID) | ORAL | Status: DC

## 2015-06-01 NOTE — Progress Notes (Signed)
By signing my name below, I, Mesha Guiyard, attest that this documentation has been prepared under the direction and in the presence of Nena Jordan, MD.  Electronically Signed: [name], Medical Scribe. 06/01/2015. 2:28 PM.   Chief Complaint:  Chief Complaint  Patient presents with  . Follow-up    back pain    HPI: Daniel [name] is a 60 y.o. male who reports to [facility] today for a follow-up for his high blood sugar, and abnormal gait. This morning his blood sugar was at 323 after eating sweets. His MRI showed a small chronic infarct in his right pons. He's going to see neurology for gait disorder this month. He feels like he's walking "like an elephant". He has seen his orthopedic, and got a cortisone shot in right knee by Dr. Percell Miller. He plans on going to see Dr. Skeet Latch, and is also getting a sleep study done later this month. He's going to see the nephrologist soon.     Past Medical History  Diagnosis Date  . Diabetes mellitus without complication ([place])   . ADHD (attention deficit hyperactivity disorder)   . Depression   . GERD (gastroesophageal reflux disease)   . HIV infection ([place])   . Ulcer   . Anxiety   . Clotting disorder ([place])   . Dizziness 03/17/2015   Past Surgical History  Procedure Laterality Date  . Small intestine surgery     Social History   Social History  . Marital Status: Single    Spouse Name: N/A  . Number of Children: N/A  . Years of Education: N/A   Social History Main Topics  . Smoking status: Former Smoker -- 0.10 packs/day for 10 years    Types: Cigars, Cigarettes  . Smokeless tobacco: Never Used  . Alcohol Use: No  . Drug Use: No  . Sexual Activity:    Partners: Male     Comment: pt. declined condoms   Other Topics Concern  . None   Social History Narrative   Epworth Sleepiness Scale = 7 (as of 03/16/2015)   Family History  Problem Relation Age of Onset  . Cancer Brother   . COPD Mother    Allergies  Allergen  Reactions  . Nsaids Other (See Comments)    unknwn  . Sustiva [Efavirenz] Rash   Prior to Admission medications   Medication Sig Start Date End Date Taking? Authorizing Provider  abacavir-dolutegravir-lamiVUDine (TRIUMEQ) 600-50-300 MG tablet Take 1 tablet by mouth daily. 03/14/15  Yes Thayer Headings, MD  ALPRAZolam Duanne Moron) 1 MG tablet Take 1 mg by mouth 3 (three) times daily as needed for anxiety.    Yes Historical Provider, MD  amphetamine-dextroamphetamine (ADDERALL) 30 MG tablet Take 30 mg by mouth 3 (three) times daily.    Yes Historical Provider, MD  atorvastatin (LIPITOR) 10 MG tablet TAKE ONE TABLET BY MOUTH ONCE DAILY. 01/12/15  Yes Tishira R Brewington, PA-C  Blood Glucose Monitoring Suppl (BLOOD GLUCOSE METER KIT AND SUPPLIES) Dispense based on patient and insurance preference. Use up to four times daily as directed. (FOR ICD-9 250.00, 250.01). 09/08/13  Yes [name], MD  glucose blood test strip Test blood sugar daily. Dx E11.9 01/10/15  Yes Tishira R Brewington, PA-C  Insulin Pen Needle (PEN NEEDLES 3/16") 31G X 5 MM MISC Use to inject Lantus daily. 03/30/15  Yes [name], MD  LANTUS SOLOSTAR 100 UNIT/ML Solostar Pen INJECT 30 UNITS SUBCUTANEOUSLY AT BEDTIME. 03/01/15  Yes [name],  MD  levothyroxine (SYNTHROID, LEVOTHROID) 75 MCG tablet Take 75 mcg by mouth at bedtime.   Yes Historical Provider, MD  LYRICA 75 MG capsule TAKE ONE CAPSULE BY MOUTH THREE DAILY. 05/08/15  Yes [name], MD  oxyCODONE (OXY IR/ROXICODONE) 5 MG immediate release tablet Take 1 tablet (5 mg total) by mouth 2 (two) times daily. 04/24/15  Yes [name], MD  prochlorperazine (COMPAZINE) 10 MG tablet TAKE ONE TABLET BY MOUTH EVERY 6 HOURS AS NEEDED. 11/23/14  Yes Thayer Headings, MD  Ranitidine HCl (RANITIDINE 75 PO) Take 1 tablet by mouth daily as needed (heartburn).    Yes Historical Provider, MD  rivaroxaban (XARELTO) 20 MG TABS tablet Take 1 tablet (20 mg total) by mouth daily. 03/13/15  Yes  [name], MD  TRINTELLIX 20 MG TABS Take 20 mg by mouth at bedtime. 01/26/15  Yes Historical Provider, MD  UNABLE TO FIND CPAP MACHINE with standard Aclaim nasal mask with humidifier. Set at 14 cwp 04/12/13  Yes [name], MD  VOLTAREN 1 % GEL APPLY 2 GRAMS TO EACH KNEE IN THE MORNING AND AT BEDTIME AND APPLY 1 GRAM TO EACH KNEE IN THE AFTERNOON. 02/23/15  Yes [name], MD  zolpidem (AMBIEN) 10 MG tablet Take 10-20 mg by mouth at bedtime as needed for sleep.    Yes Historical Provider, MD     ROS: The patient denies fevers, chills, night sweats, unintentional weight loss, chest pain, palpitations, wheezing, dyspnea on exertion, nausea, vomiting, abdominal pain, dysuria, hematuria, melena, numbness, weakness, or tingling.   All other systems have been reviewed and were otherwise negative with the exception of those mentioned in the HPI and as above.    PHYSICAL EXAM: Filed Vitals:   06/01/15 1341  BP: 132/78  Pulse: 84  Temp: 98.1 F (36.7 C)  Resp: 16   Body mass index is 32.57 kg/(m^2).   General: Alert, no acute distress HEENT:  Normocephalic, atraumatic, oropharynx patent. Eye: Juliette Mangle [facility] Cardiovascular:  Regular rate and rhythm, no rubs murmurs or gallops.  No Carotid bruits, radial pulse intact. No pedal edema.  Respiratory: Clear to auscultation bilaterally.  No wheezes, rales, or rhonchi.  No cyanosis, no use of accessory musculature Abdominal: No organomegaly, abdomen is soft and non-tender, positive bowel sounds.  No masses. Musculoskeletal: Gait intact. No edema, tenderness Skin: No rashes. Neurologic: Facial musculature symmetric. Patient has a wide base gait. He is unsteady with Romberg testing. Psychiatric: Patient acts appropriately throughout our interaction. Lymphatic: No cervical or submandibular lymphadenopathy   LABS:    EKG/XRAY:   Primary read interpreted by Dr. Everlene Farrier at [facility].   ASSESSMENT/PLAN: Patient has a small lesion in the pons on  MRI. He also has significant peripheral neuropathy. He is very nonadherent with his diet and so his sugars run high. He is also started getting steroid injections in his knees so he will be even more prone to hyperglycemia. I instructed him to increase his Lantus. He admitted to taking milkshakes at 3 and 4 in the morning and this was the cause of his elevated sugars.I personally performed the services described in this documentation, which was scribed in my presence. The recorded information has been reviewed and is accurate.   Gross sideeffects, risk and benefits, and alternatives of medications d/w patient. Patient is aware that all medications have potential sideeffects and we are unable to predict every sideeffect or drug-drug interaction that may occur.  Arlyss Queen MD 06/01/2015 2:28 PM

## 2015-06-01 NOTE — Progress Notes (Signed)
Opened in error, please disregard.

## 2015-06-01 NOTE — Patient Instructions (Signed)
     IF you received an x-ray today, you will receive an invoice from [facility]. Please contact [facility] at [phone] with questions or concerns regarding your invoice.   IF you received labwork today, you will receive an invoice from [facility]. Please contact [facility] at [phone] with questions or concerns regarding your invoice.   Our billing staff will not be able to assist you with questions regarding bills from these companies.  You will be contacted with the lab results as soon as they are available. The fastest way to get your results is to activate your My Chart account. Instructions are located on the last page of this paperwork. If you have not heard from us regarding the results in 2 weeks, please contact this office.      

## 2015-06-11 ENCOUNTER — Ambulatory Visit (INDEPENDENT_AMBULATORY_CARE_PROVIDER_SITE_OTHER): Payer: BLUE CROSS/BLUE SHIELD | Admitting: Neurology

## 2015-06-11 ENCOUNTER — Encounter: Payer: Self-pay | Admitting: Neurology

## 2015-06-11 VITALS — BP 119/83 | HR 82 | Ht 70.0 in | Wt 222.0 lb

## 2015-06-11 DIAGNOSIS — R269 Unspecified abnormalities of gait and mobility: Secondary | ICD-10-CM | POA: Diagnosis not present

## 2015-06-11 DIAGNOSIS — M545 Low back pain, unspecified: Secondary | ICD-10-CM | POA: Insufficient documentation

## 2015-06-11 NOTE — Progress Notes (Signed)
PATIENT: Daniel [name] DOB: September 26, 1955  Chief Complaint  Patient presents with  . Gait Problem    Reports his gait has been unsteady for the last 3-4 months. He tends to run into walls and the unsteadiness has resulted in multiple falls.  He would like to further discuss his abnormal MRI findings from 05/13/15.  . OSA on CPAP    He is under the care of Dr. Brett Fairy.     HISTORICAL  Daniel [name] is a 60 years old right-handed male , seen in refer by Dr. Brett Fairy and his primary care physician Dr. Ivar Bury for evaluation of unsteady Gait in June 11 2015  I have reviewed and summarized referring note, he had a history of hypertension, hyperlipidemia, diabetes, HIV for more than 10 years, on treatment, most recent CD4 was 740, obstructive sleep apnea, using CPAP machine, hx of right leg DVT, PE on chronic anticoagulations, xelrato, he used to work for a [employer], which requires ladder climbing, last time he worked was December 25 2014.  He has some chronic low back pain, neck pain, but denies radiating pain to upper or lower extremity, he has bilateral feet paresthesia due to diabetic peripheral neuropathy for many years, denies bowel and bladder incontinence  In February 21 2015, he fell asleep on his sofa, when he tried to get up next morning, he had difficulty bearing weight, bilateral lower extremity weakness, he was taken to the [facility], his bilateral lower extremity gradually improved, he was discharged the same day, was able to ambulate again, there was no clear etiology found.  But since that event, he fell 3 times over the past few months, his right toes tend to be caught on the floor, sometimes without clear triggers.  I have personally reviewed MRI of the brain without contrast in March 2017, mild supratentorium small vessel disease,  right ventral pontine small vessel disease, less likely explaining his complains of gait difficulty   REVIEW OF SYSTEMS:  Full 14 system review of systems performed and notable only for fatigue, hearing loss, shortness of breath, apnea, daytime sleepiness, sleep talking, joint pain, back pain, walking difficulty, numbness, agitation, decreased concentration, depression anxiety  ALLERGIES: Allergies  Allergen Reactions  . Nsaids Other (See Comments)    unknwn  . Sustiva [Efavirenz] Rash    HOME MEDICATIONS: Current Outpatient Prescriptions  Medication Sig Dispense Refill  . abacavir-dolutegravir-lamiVUDine (TRIUMEQ) 600-50-300 MG tablet Take 1 tablet by mouth daily. 30 tablet 5  . ALPRAZolam (XANAX) 1 MG tablet Take 1 mg by mouth 3 (three) times daily as needed for anxiety.     [name] amphetamine-dextroamphetamine (ADDERALL) 30 MG tablet Take 30 mg by mouth 3 (three) times daily.     [name] atorvastatin (LIPITOR) 10 MG tablet TAKE ONE TABLET BY MOUTH ONCE DAILY. 90 tablet 11  . Blood Glucose Monitoring Suppl (BLOOD GLUCOSE METER KIT AND SUPPLIES) Dispense based on patient and insurance preference. Use up to four times daily as directed. (FOR ICD-9 250.00, 250.01). 1 each 0  . glucose blood test strip Test blood sugar daily. Dx E11.9 50 each 11  . Insulin Pen Needle (PEN NEEDLES 3/16") 31G X 5 MM MISC Use to inject Lantus daily. 100 each 3  . LANTUS SOLOSTAR 100 UNIT/ML Solostar Pen INJECT 30 UNITS SUBCUTANEOUSLY AT BEDTIME. 15 mL 2  . levothyroxine (SYNTHROID, LEVOTHROID) 75 MCG tablet Take 75 mcg by mouth at bedtime.    [name] LYRICA 75 MG capsule TAKE ONE  CAPSULE BY MOUTH THREE DAILY. 90 capsule 0  . oxyCODONE (OXY IR/ROXICODONE) 5 MG immediate release tablet Take 1 tablet (5 mg total) by mouth 2 (two) times daily. 45 tablet 0  . prochlorperazine (COMPAZINE) 10 MG tablet TAKE ONE TABLET BY MOUTH EVERY 6 HOURS AS NEEDED. 120 tablet 0  . Ranitidine HCl (RANITIDINE 75 PO) Take 1 tablet by mouth daily as needed (heartburn).     . rivaroxaban (XARELTO) 20 MG TABS tablet Take 1 tablet (20 mg total) by mouth daily. 30 tablet 5  .  TRINTELLIX 20 MG TABS Take 20 mg by mouth at bedtime.    [name] UNABLE TO FIND CPAP MACHINE with standard Aclaim nasal mask with humidifier. Set at 14 cwp 1 each 0  . VOLTAREN 1 % GEL APPLY 2 GRAMS TO EACH KNEE IN THE MORNING AND AT BEDTIME AND APPLY 1 GRAM TO EACH KNEE IN THE AFTERNOON. 300 g 5  . zolpidem (AMBIEN) 10 MG tablet Take 10-20 mg by mouth at bedtime as needed for sleep.      No current facility-administered medications for this visit.    PAST MEDICAL HISTORY: Past Medical History  Diagnosis Date  . Diabetes mellitus without complication ([place])   . ADHD (attention deficit hyperactivity disorder)   . Depression   . GERD (gastroesophageal reflux disease)   . HIV infection ([place])   . Ulcer   . Anxiety   . Clotting disorder ([place])   . Dizziness 03/17/2015    PAST SURGICAL HISTORY: Past Surgical History  Procedure Laterality Date  . Small intestine surgery      FAMILY HISTORY: Family History  Problem Relation Age of Onset  . Cancer Brother   . COPD Mother     SOCIAL HISTORY:  Social History   Social History  . Marital Status: Single    Spouse Name: N/A  . Number of Children: N/A  . Years of Education: N/A   Occupational History  . 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 Smoking status: Former Smoker -- 0.10 packs/day for 10 years    Types: Cigars, Cigarettes  . Smokeless tobacco: Never Used  . Alcohol Use: No  . Drug Use: No  . Sexual Activity:    Partners: Male     Comment: pt. declined condoms   Other Topics Concern  . Not on file   Social History Narrative   Epworth Sleepiness Scale = 7 (as of 03/16/2015)     PHYSICAL EXAM   Filed Vitals:   06/11/15 1349  BP: 119/83  Pulse: 82  Height: 5' 10"  (1.778 m)  Weight: 222 lb (100.699 kg)    Not recorded      Body mass index is 31.85 kg/(m^2).  PHYSICAL EXAMNIATION:  Gen: NAD, conversant, well nourised,  obese, well groomed                     Cardiovascular: Regular rate rhythm, no peripheral edema, warm, nontender. Eyes: Conjunctivae clear without exudates or hemorrhage Neck: Supple, no carotid bruise. Pulmonary: Clear to auscultation bilaterally   NEUROLOGICAL EXAM:  MENTAL STATUS: Speech:    Speech is normal; fluent and spontaneous with normal comprehension.  Cognition:     Orientation to time, place and person     Normal recent and remote memory     Normal Attention span and concentration     Normal Language, naming,  repeating,spontaneous speech     Fund of knowledge   CRANIAL NERVES: CN II: Visual fields are full to confrontation. Fundoscopic exam is normal with sharp discs and no vascular changes. Pupils are round equal and briskly reactive to light. CN III, IV, VI: extraocular movement are normal. No ptosis. CN V: Facial sensation is intact to pinprick in all 3 divisions bilaterally. Corneal responses are intact.  CN VII: Face is symmetric with normal eye closure and smile. CN VIII: Hearing is normal to rubbing fingers CN IX, X: Palate elevates symmetrically. Phonation is normal. CN XI: Head turning and shoulder shrug are intact CN XII: Tongue is midline with normal movements and no atrophy.  MOTOR: There is no pronator drift of out-stretched arms. Muscle bulk and tone are normal. Muscle strength is normal.  REFLEXES: Reflexes are 2+ and symmetric at the biceps, triceps,  1 at bilateral knees, and absent at  ankles. Plantar responses are flexor.  SENSORY: Mildly length dependent decreased light touch pinprick to ankle level  COORDINATION: Rapid alternating movements and fine finger movements are intact. There is no dysmetria on finger-to-nose and heel-knee-shin.    GAIT/STANCE: Posture is  hesitate, moderate stride, able to stand up on heel and toe, he could not perform tandem walking, Romberg signs was negative  DIAGNOSTIC DATA (LABS, IMAGING, TESTING) - I  reviewed patient records, labs, notes, testing and imaging myself where available.   ASSESSMENT AND PLAN  KAMAU WEATHERALL is a 60 y.o. male   gait abnormality  Multifactorial, includes deconditioning, diabetic peripheral neuropathy, need to rule out lumbar radiculopathy  MRI of lumbar  EMG nerve conduction study  The right ventral pontine small vessel disease would not explain his gait difficulty.  Diabetic peripheral neuropathy    Marcial Pacas, M.D. Ph.D.  [facility] [address] [place], [place] [address] Ph: [phone] Fax: [phone]  CC: [name], MD

## 2015-06-13 ENCOUNTER — Ambulatory Visit (INDEPENDENT_AMBULATORY_CARE_PROVIDER_SITE_OTHER): Payer: BLUE CROSS/BLUE SHIELD

## 2015-06-13 DIAGNOSIS — M545 Low back pain: Secondary | ICD-10-CM

## 2015-06-13 DIAGNOSIS — R269 Unspecified abnormalities of gait and mobility: Secondary | ICD-10-CM | POA: Diagnosis not present

## 2015-06-17 ENCOUNTER — Ambulatory Visit (INDEPENDENT_AMBULATORY_CARE_PROVIDER_SITE_OTHER): Payer: BLUE CROSS/BLUE SHIELD | Admitting: Neurology

## 2015-06-17 DIAGNOSIS — Z9989 Dependence on other enabling machines and devices: Secondary | ICD-10-CM

## 2015-06-17 DIAGNOSIS — G4733 Obstructive sleep apnea (adult) (pediatric): Secondary | ICD-10-CM | POA: Diagnosis not present

## 2015-06-17 NOTE — Progress Notes (Signed)
Cardiology Office Note   Date:  06/18/2015   ID:  Daniel [name], Daniel [name] 01/20/1956, MRN [id number]  PCP:  Jenny Reichmann, MD  Cardiologist:   Skeet Latch, MD   Chief Complaint  Patient presents with  . Follow-up    shortness of breath with exersion, neuropathy in feet, occ calf cramps, dizziness is gone,      Daniel [name] is a 60 y.o. male with HIV, DV/PE, and diabetes who presents for follow up on dizziness.   Interval History 06/18/15: Daniel [name] echo showed normal systolic function.  Carotid Dopppler showed mild plaque bilaterally.  His dizziness has improved and he thinks it was due to vertigo.  He denies chest pain recently.  He had some chest pain 10 years ago that was not due to a heart attack.  He notes shortness of breath with minimal exertion. This is been long-standing but is worse recently. [place] in the airport and had to recover for 30 minutes before catching his breath when walking to his gate.  Daniel [name] does not get any exercise, which he attributes to laziness.    Daniel [name] reports constant fatigue. His sleep patterns are very irregular. He watches TV all night and goes to sleep in the morning. He does note snoring. He had a sleep study last night and is awaiting the results.  History of Present Illness 03/16/15:  Daniel [name] reports dizziness that has been ongoing since September or October. He has suffered recurrent falls since that time.  When he changes position he feels as though the room is spinning. It is worse when he leans his head back or lays down. It also occurs when standing up quickly but resolves within a few moments.  He has fallen more times than he remember.  The last fall occurred two days ago.  He stood up after laying down and fell within a few moments of standing.  There was no preceding chest pain, shortness of breath or palpitations.  He has been taking meclizine but it isn't helping.  Daniel [name] was diagnosed with a DVT on 02/09/15.   He was started on Xarelto for anticoagulation.  He reports a history of DVT and PE 10 years ago.  He previously saw a hematologist but never found out why he had blood clots.  He understands that he will be on Xarelto indefinitely.  He notes exertional shortness of breath that is long-standing.  He denies palpitations, lower extremity edema, orthopnea or PND.  Daniel [name] does not get any exercise.  He states that his diet is terrible.    Past Medical History  Diagnosis Date  . Diabetes mellitus without complication ([place])   . ADHD (attention deficit hyperactivity disorder)   . Depression   . GERD (gastroesophageal reflux disease)   . HIV infection ([name])   . Ulcer   . Anxiety   . Clotting disorder ([place])   . Dizziness 03/17/2015    Past Surgical History  Procedure Laterality Date  . Small intestine surgery       Current Outpatient Prescriptions  Medication Sig Dispense Refill  . abacavir-dolutegravir-lamiVUDine (TRIUMEQ) 600-50-300 MG tablet Take 1 tablet by mouth daily. 30 tablet 5  . ALPRAZolam (XANAX) 1 MG tablet Take 1 mg by mouth 3 (three) times daily as needed for anxiety.     [name] amphetamine-dextroamphetamine (ADDERALL) 30 MG tablet Take 30 mg by mouth 3 (three) times daily.     [name] atorvastatin (LIPITOR) 10  MG tablet TAKE ONE TABLET BY MOUTH ONCE DAILY. 90 tablet 11  . Blood Glucose Monitoring Suppl (BLOOD GLUCOSE METER KIT AND SUPPLIES) Dispense based on patient and insurance preference. Use up to four times daily as directed. (FOR ICD-9 250.00, 250.01). 1 each 0  . glucose blood test strip Test blood sugar daily. Dx E11.9 50 each 11  . Insulin Pen Needle (PEN NEEDLES 3/16") 31G X 5 MM MISC Use to inject Lantus daily. 100 each 3  . LANTUS SOLOSTAR 100 UNIT/ML Solostar Pen INJECT 30 UNITS SUBCUTANEOUSLY AT BEDTIME. 15 mL 2  . levothyroxine (SYNTHROID, LEVOTHROID) 75 MCG tablet Take 75 mcg by mouth at bedtime.    [name] LYRICA 75 MG capsule TAKE ONE CAPSULE BY MOUTH THREE DAILY. 90 capsule  0  . oxyCODONE (OXY IR/ROXICODONE) 5 MG immediate release tablet Take 1 tablet (5 mg total) by mouth 2 (two) times daily. 45 tablet 0  . prochlorperazine (COMPAZINE) 10 MG tablet TAKE ONE TABLET BY MOUTH EVERY 6 HOURS AS NEEDED. 120 tablet 0  . ranitidine (ZANTAC) 150 MG tablet Take 150 mg by mouth daily.    . rivaroxaban (XARELTO) 20 MG TABS tablet Take 1 tablet (20 mg total) by mouth daily. 30 tablet 5  . TRINTELLIX 20 MG TABS Take 20 mg by mouth at bedtime.    [name] UNABLE TO FIND CPAP MACHINE with standard Aclaim nasal mask with humidifier. Set at 14 cwp 1 each 0  . VOLTAREN 1 % GEL APPLY 2 GRAMS TO EACH KNEE IN THE MORNING AND AT BEDTIME AND APPLY 1 GRAM TO EACH KNEE IN THE AFTERNOON. 300 g 5  . zolpidem (AMBIEN) 10 MG tablet Take 10-20 mg by mouth at bedtime as needed for sleep.      No current facility-administered medications for this visit.    Allergies:   Nsaids and Sustiva    Social History:  The patient  reports that he has quit smoking. His smoking use included Cigars and Cigarettes. He has a 1 pack-year smoking history. He has never used smokeless tobacco. He reports that he does not drink alcohol or use illicit drugs.   Family History:  The patient's family history includes COPD in his mother; Cancer in his brother.    ROS:  Please see the history of present illness.  Otherwise, review of systems are positive for none.   All other systems are reviewed and negative.    PHYSICAL EXAM: VS:  BP 138/92 mmHg  Pulse 88  Ht 5' 10"  (1.778 m)  Wt 100.699 kg (222 lb)  BMI 31.85 kg/m2 , BMI Body mass index is 31.85 kg/(m^2).  Repeat BP 136/84 GENERAL:  Well appearing HEENT:  Pupils equal round and reactive, fundi not visualized, oral mucosa unremarkable NECK:  No jugular venous distention, waveform within normal limits, carotid upstroke brisk and symmetric, no bruits, no thyromegaly LYMPHATICS:  No cervical adenopathy LUNGS:  Clear to auscultation bilaterally HEART:  RRR.  PMI not  displaced or sustained,S1 and S2 within normal limits, no S3, no S4, no clicks, no rubs, no murmurs ABD:  Flat, positive bowel sounds normal in frequency in pitch, no bruits, no rebound, no guarding, no midline pulsatile mass, no hepatomegaly, no splenomegaly EXT:  2 plus pulses throughout, no edema, no cyanosis no clubbing SKIN:  No rashes no nodules NEURO:  Cranial nerves II through XII grossly intact, motor grossly intact throughout PSYCH:  Cognitively intact, oriented to person place and time  EKG:  EKG is ordered today.  The ekg ordered today demonstrates sinus rhythm.  Rate 90 bpm.   Carotid Doppler 03/23/15: IMPRESSION: 1. Trace smooth heterogeneous plaque in the distal left common carotid artery. 2. No evidence of internal carotid plaque or stenosis bilaterally. 3. Vertebral arteries remain patent with normal antegrade flow.  Echo 04/05/15: Study Conclusions  - Left ventricle: The cavity size was normal. Wall thickness was  increased in a pattern of mild LVH. Systolic function was normal.  The estimated ejection fraction was in the range of 60% to 65%.  Biplane speckle tracking LVEF was not accurately measured and  therefore not reported. Wall motion was normal; there were no  regional wall motion abnormalities. Left ventricular diastolic  function parameters were normal for the patient&'s age. - Aortic valve: Mildly calcified annulus. Trileaflet. - Mitral valve: Calcified annulus. There was trivial regurgitation. - Right atrium: Central venous pressure (est): 3 mm Hg. - Tricuspid valve: There was physiologic regurgitation. - Pulmonary arteries: Systolic pressure could not be accurately  estimated. - Pericardium, extracardiac: There was no pericardial effusion.  Recent Labs: 02/22/2015: ALT 17; BUN 17; Creatinine, Ser 1.59*; Hemoglobin 14.2; Platelets 95*; Potassium 3.3*; Sodium 138    Lipid Panel    Component Value Date/Time   CHOL 206* 11/07/2013 1308   TRIG  206* 11/07/2013 1308   HDL 42 11/07/2013 1308   CHOLHDL 4.9 11/07/2013 1308   VLDL 41* 11/07/2013 1308   LDLCALC 123* 11/07/2013 1308      Wt Readings from Last 3 Encounters:  06/18/15 100.699 kg (222 lb)  06/11/15 100.699 kg (222 lb)  06/11/15 100.699 kg (222 lb)      ASSESSMENT AND PLAN:  # Dizziness, vertigo: Symptoms improved with management of vertigo. Carotid ultrasound did not show obstructive disease.   # Hyperlipidemia: Continue atorvastatin.  We will check non-fasting lipids today.  Given that he is on HAART,  if he is not at goal, he likely will need to switch to high-dose pravastatin or pitavastatin.  # DVT: Recurrent, unprovoked DVT. Lifelong Xarelto.  # SOB: Mr. Mclaren reports worsening exertional dyspnea. He has several risk factors for coronary artery disease including male gender, age, and hyperlipidemia. We will refer him for [facility]. He reports a history of several falls which were mostly due to BPPV, but he remains unsteady on his feet. He was noted to have grade 1 diastolic dysfunction on echo, but has no evidence of heart failure on exam.  # Elevated BP:  on initial check his blood pressure was 138/92. On repeat it was 136/84. He reports that it has been well-controlled overall. It might have been mildly elevated due to white coat hypertension. We will continue to monitor and not make any changes at this time.   Current medicines are reviewed at length with the patient today.  The patient does not have concerns regarding medicines.  The following changes have been made:  no change  Labs/ tests ordered today include:   Orders Placed This Encounter  Procedures  . Lipid panel  . Myocardial Perfusion Imaging     Disposition:   FU with Cybill Uriegas C. [name], MD, [facility] in 6 months.   This note was written with the assistance of speech recognition software.  Please excuse any transcriptional errors.  Signed, Aubriana Ravelo C. [name], MD, [facility]    06/18/2015 5:05 PM    [place]

## 2015-06-18 ENCOUNTER — Ambulatory Visit (INDEPENDENT_AMBULATORY_CARE_PROVIDER_SITE_OTHER): Payer: BLUE CROSS/BLUE SHIELD | Admitting: Cardiovascular Disease

## 2015-06-18 ENCOUNTER — Encounter: Payer: Self-pay | Admitting: Cardiovascular Disease

## 2015-06-18 VITALS — BP 138/92 | HR 88 | Ht 70.0 in | Wt 222.0 lb

## 2015-06-18 DIAGNOSIS — E785 Hyperlipidemia, unspecified: Secondary | ICD-10-CM | POA: Diagnosis not present

## 2015-06-18 DIAGNOSIS — R030 Elevated blood-pressure reading, without diagnosis of hypertension: Secondary | ICD-10-CM

## 2015-06-18 DIAGNOSIS — R42 Dizziness and giddiness: Secondary | ICD-10-CM

## 2015-06-18 DIAGNOSIS — IMO0001 Reserved for inherently not codable concepts without codable children: Secondary | ICD-10-CM

## 2015-06-18 DIAGNOSIS — I82409 Acute embolism and thrombosis of unspecified deep veins of unspecified lower extremity: Secondary | ICD-10-CM

## 2015-06-18 DIAGNOSIS — R0602 Shortness of breath: Secondary | ICD-10-CM | POA: Diagnosis not present

## 2015-06-18 NOTE — Patient Instructions (Signed)
Medication Instructions:  Your physician recommends that you continue on your current medications as directed. Please refer to the Current Medication list given to you today.  Labwork: LIPID PANEL TODAY AT [facility] ON THE FIRST FLOOW  Testing/Procedures: Your physician has requested that you have a lexiscan myoview. For further information please visit [email or web address]. Please follow instruction sheet, as given.  Follow-Up: Your physician wants you to follow-up in: [place] will receive a reminder letter in the mail two months in advance. If you don't receive a letter, please call our office to schedule the follow-up appointment.  If you need a refill on your cardiac medications before your next appointment, please call your pharmacy.

## 2015-06-20 ENCOUNTER — Telehealth: Payer: Self-pay

## 2015-06-20 DIAGNOSIS — D689 Coagulation defect, unspecified: Secondary | ICD-10-CM | POA: Insufficient documentation

## 2015-06-20 DIAGNOSIS — G4733 Obstructive sleep apnea (adult) (pediatric): Secondary | ICD-10-CM | POA: Insufficient documentation

## 2015-06-20 DIAGNOSIS — T17908A Unspecified foreign body in respiratory tract, part unspecified causing other injury, initial encounter: Secondary | ICD-10-CM | POA: Insufficient documentation

## 2015-06-20 DIAGNOSIS — R748 Abnormal levels of other serum enzymes: Secondary | ICD-10-CM | POA: Insufficient documentation

## 2015-06-20 DIAGNOSIS — G894 Chronic pain syndrome: Secondary | ICD-10-CM | POA: Insufficient documentation

## 2015-06-20 DIAGNOSIS — Z86718 Personal history of other venous thrombosis and embolism: Secondary | ICD-10-CM

## 2015-06-20 DIAGNOSIS — E1165 Type 2 diabetes mellitus with hyperglycemia: Secondary | ICD-10-CM | POA: Insufficient documentation

## 2015-06-20 DIAGNOSIS — F1190 Opioid use, unspecified, uncomplicated: Secondary | ICD-10-CM | POA: Insufficient documentation

## 2015-06-20 DIAGNOSIS — Z9181 History of falling: Secondary | ICD-10-CM | POA: Insufficient documentation

## 2015-06-20 DIAGNOSIS — D75839 Thrombocytosis, unspecified: Secondary | ICD-10-CM | POA: Insufficient documentation

## 2015-06-20 NOTE — Telephone Encounter (Signed)
Called pt to discuss sleep study results. No answer, left a message asking him to call me back.

## 2015-06-21 DIAGNOSIS — R519 Headache, unspecified: Secondary | ICD-10-CM | POA: Insufficient documentation

## 2015-06-21 NOTE — Telephone Encounter (Signed)
Spoke to pt regarding his sleep study results. I advised pt that his study revealed the continuous presence of osa and Dr. Brett Fairy recommends starting a cpap. His study also revealed insomnia, and possible circadian rhythm disorder. Pt is agreeable to starting a new cpap. He says his current cpap is over 62 years old. Pt asks that I send the order to [facility]. I advised pt to avoid caffeine containing beverages and chocolate. I advised pt that if a new cpap does not help his insomnia, pt should see Dr. Toy Care. Pt verbalized understanding.

## 2015-06-21 NOTE — Telephone Encounter (Signed)
Called pt again to discuss sleep study results. No answer, left message asking the pt to call me back at their convenience.  If pt calls back on Friday, please advise him that I will return his call on Monday.

## 2015-06-21 NOTE — Addendum Note (Signed)
Addended by: Lester Artesia A on: 06/21/2015 04:28 PM   Modules accepted: Orders

## 2015-06-22 DIAGNOSIS — Z8669 Personal history of other diseases of the nervous system and sense organs: Secondary | ICD-10-CM | POA: Insufficient documentation

## 2015-06-24 DIAGNOSIS — R296 Repeated falls: Secondary | ICD-10-CM | POA: Insufficient documentation

## 2015-06-27 ENCOUNTER — Telehealth (HOSPITAL_COMMUNITY): Payer: Self-pay

## 2015-06-27 NOTE — Telephone Encounter (Signed)
Encounter complete. 

## 2015-06-28 ENCOUNTER — Encounter: Payer: Self-pay | Admitting: Neurology

## 2015-06-28 ENCOUNTER — Ambulatory Visit (INDEPENDENT_AMBULATORY_CARE_PROVIDER_SITE_OTHER): Payer: BLUE CROSS/BLUE SHIELD | Admitting: Neurology

## 2015-06-28 VITALS — BP 122/62 | HR 84 | Ht 70.0 in | Wt 222.0 lb

## 2015-06-28 DIAGNOSIS — R55 Syncope and collapse: Secondary | ICD-10-CM

## 2015-06-28 DIAGNOSIS — E0849 Diabetes mellitus due to underlying condition with other diabetic neurological complication: Secondary | ICD-10-CM | POA: Diagnosis not present

## 2015-06-28 DIAGNOSIS — Z794 Long term (current) use of insulin: Secondary | ICD-10-CM

## 2015-06-28 DIAGNOSIS — R269 Unspecified abnormalities of gait and mobility: Secondary | ICD-10-CM

## 2015-06-28 DIAGNOSIS — IMO0002 Reserved for concepts with insufficient information to code with codable children: Secondary | ICD-10-CM | POA: Insufficient documentation

## 2015-06-28 NOTE — Progress Notes (Signed)
Chief Complaint  Patient presents with  . Back Pain    He is here to discuss his MRI.  He has not had his EMG/NCV yet. He has continued to fall since last seen.  He was admitted to [facility] on 06/20/15 and to be treated for sepsis.      PATIENT: Daniel [name] DOB: [birth year]  Chief Complaint  Patient presents with  . Back Pain    He is here to discuss his MRI.  He has not had his EMG/NCV yet. He has continued to fall since last seen.  He was admitted to [facility] on 06/20/15 and to be treated for sepsis.     HISTORICAL  Daniel [name] is a 60 years old right-handed male , seen in refer by Dr. Brett Fairy and his primary care physician Dr. Ivar Bury for evaluation of unsteady Gait in June 11 2015  I have reviewed and summarized referring note, he had a history of hypertension, hyperlipidemia, diabetes, HIV for more than 10 years, on treatment, most recent CD4 was 740, obstructive sleep apnea, using CPAP machine, hx of right leg DVT, PE on chronic anticoagulations, xelrato, he used to work for a [employer], which requires ladder climbing, last time he worked was December 25 2014.  He has some chronic low back pain, neck pain, but denies radiating pain to upper or lower extremity, he has bilateral feet paresthesia due to diabetic peripheral neuropathy for many years, denies bowel and bladder incontinence  In February 21 2015, he fell asleep on his sofa, when he tried to get up next morning, he had difficulty bearing weight, bilateral lower extremity weakness, he was taken to the [facility], his bilateral lower extremity gradually improved, he was discharged the same day, was able to ambulate again, there was no clear etiology found.  But since that event, he fell 3 times over the past few months, his right toes tend to be caught on the floor, sometimes without clear triggers.  I have personally reviewed MRI of the brain without contrast in  March 2017, mild supratentorium small vessel disease,  right ventral pontine small vessel disease, less likely explaining his complains of gait difficulty   UPDATE May 4th 2017: Echocardiogram in February 2017: Ejection fraction 60-65%, word thickness was increased in a pattern of mild left ventricular hypertrophy.  We have personally reviewed MRI of lumbar in April 2017:  At L4-L5 there is moderate facet hypertrophy causing mild foraminal and minimal lateral recess narrowing. There is no nerve root impingement.  L5-S1 there is a small midline disc protrusion and mild facet hypertrophy. There is mild right and minimal left foraminal narrowing. There does not appear to be any nerve root impingement.  Recent sleep study confirmed obstructive sleep apnea, CPAP is recommended  Today he complains lightheadedness with sudden positional change, intermittent bilateral feet paresthesia, he continue have falling episode, when they will cup from sleep, sofa, confused, could not get up  REVIEW OF SYSTEMS: Full 14 system review of systems performed and notable only for fatigue, hearing loss, shortness of breath, apnea, daytime sleepiness, sleep talking, joint pain, back pain, walking difficulty, numbness, agitation, decreased concentration, depression anxiety  ALLERGIES: Allergies  Allergen Reactions  . Aspirin Swelling  . Ibuprofen Swelling  . Sustiva [Efavirenz] Rash and Swelling    And rash.  . Acetaminophen     States he was told by Nephrologist not to use Tylenol due to CKD.    [name]  Nsaids Other (See Comments)    unknwn    HOME MEDICATIONS: Current Outpatient Prescriptions  Medication Sig Dispense Refill  . abacavir-dolutegravir-lamiVUDine (TRIUMEQ) 600-50-300 MG tablet Take 1 tablet by mouth daily. 30 tablet 5  . ALPRAZolam (XANAX) 1 MG tablet Take 1 mg by mouth 3 (three) times daily as needed for anxiety.     [name] amphetamine-dextroamphetamine (ADDERALL) 30 MG tablet Take 30 mg by mouth 3 (three)  times daily.     [name] atorvastatin (LIPITOR) 10 MG tablet TAKE ONE TABLET BY MOUTH ONCE DAILY. 90 tablet 11  . Blood Glucose Monitoring Suppl (BLOOD GLUCOSE METER KIT AND SUPPLIES) Dispense based on patient and insurance preference. Use up to four times daily as directed. (FOR ICD-9 250.00, 250.01). 1 each 0  . glucose blood test strip Test blood sugar daily. Dx E11.9 50 each 11  . Insulin Pen Needle (PEN NEEDLES 3/16") 31G X 5 MM MISC Use to inject Lantus daily. 100 each 3  . LANTUS SOLOSTAR 100 UNIT/ML Solostar Pen INJECT 30 UNITS SUBCUTANEOUSLY AT BEDTIME. 15 mL 2  . levothyroxine (SYNTHROID, LEVOTHROID) 75 MCG tablet Take 75 mcg by mouth at bedtime.    [name] LYRICA 75 MG capsule TAKE ONE CAPSULE BY MOUTH THREE DAILY. 90 capsule 0  . oxyCODONE (OXY IR/ROXICODONE) 5 MG immediate release tablet Take 1 tablet (5 mg total) by mouth 2 (two) times daily. 45 tablet 0  . prochlorperazine (COMPAZINE) 10 MG tablet TAKE ONE TABLET BY MOUTH EVERY 6 HOURS AS NEEDED. 120 tablet 0  . ranitidine (ZANTAC) 150 MG tablet Take 150 mg by mouth daily.    . rivaroxaban (XARELTO) 20 MG TABS tablet Take 1 tablet (20 mg total) by mouth daily. 30 tablet 5  . TRINTELLIX 20 MG TABS Take 20 mg by mouth at bedtime.    [name] UNABLE TO FIND CPAP MACHINE with standard Aclaim nasal mask with humidifier. Set at 14 cwp 1 each 0  . VOLTAREN 1 % GEL APPLY 2 GRAMS TO EACH KNEE IN THE MORNING AND AT BEDTIME AND APPLY 1 GRAM TO EACH KNEE IN THE AFTERNOON. 300 g 5  . zolpidem (AMBIEN) 10 MG tablet Take 10-20 mg by mouth at bedtime as needed for sleep.      No current facility-administered medications for this visit.    PAST MEDICAL HISTORY: Past Medical History  Diagnosis Date  . Diabetes mellitus without complication ([place])   . ADHD (attention deficit hyperactivity disorder)   . Depression   . GERD (gastroesophageal reflux disease)   . HIV infection ([place])   . Ulcer   . Anxiety   . Clotting disorder ([place])   . Dizziness 03/17/2015     PAST SURGICAL HISTORY: Past Surgical History  Procedure Laterality Date  . Small intestine surgery      FAMILY HISTORY: Family History  Problem Relation Age of Onset  . Cancer Brother   . COPD Mother     SOCIAL HISTORY:  Social History   Social History  . Marital Status: Single    Spouse Name: N/A  . Number of Children: N/A  . Years of Education: N/A   Occupational History  . 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 Smoking status: Former Smoker -- 0.10 packs/day for 10 years    Types: Cigars, Cigarettes  . Smokeless tobacco: Never Used  . Alcohol Use: No  . Drug Use: No  .  Sexual Activity:    Partners: Male     Comment: pt. declined condoms   Other Topics Concern  . Not on file   Social History Narrative   Epworth Sleepiness Scale = 7 (as of 03/16/2015)     PHYSICAL EXAM   Filed Vitals:   06/28/15 0753  BP: 122/62  Pulse: 84  Height: _0  (1.778 m)  Weight: 222 lb (100.699 kg)    Not recorded      Body mass index is 31.85 kg/(m^2).  PHYSICAL EXAMNIATION:  Gen: NAD, conversant, well nourised, obese, well groomed                     Cardiovascular: Regular rate rhythm, no peripheral edema, warm, nontender. Eyes: Conjunctivae clear without exudates or hemorrhage Neck: Supple, no carotid bruise. Pulmonary: Clear to auscultation bilaterally   NEUROLOGICAL EXAM:  MENTAL STATUS: Speech:    Speech is normal; fluent and spontaneous with normal comprehension.  Cognition:     Orientation to time, place and person     Normal recent and remote memory     Normal Attention span and concentration     Normal Language, naming, repeating,spontaneous speech     Fund of knowledge   CRANIAL NERVES: CN II: Visual fields are full to confrontation. Fundoscopic exam is normal with sharp discs and no vascular changes. Pupils are round equal and briskly reactive to  light. CN III, IV, VI: extraocular movement are normal. No ptosis. CN V: Facial sensation is intact to pinprick in all 3 divisions bilaterally. Corneal responses are intact.  CN VII: Face is symmetric with normal eye closure and smile. CN VIII: Hearing is normal to rubbing fingers CN IX, X: Palate elevates symmetrically. Phonation is normal. CN XI: Head turning and shoulder shrug are intact CN XII: Tongue is midline with normal movements and no atrophy.  MOTOR: There is no pronator drift of out-stretched arms. Muscle bulk and tone are normal. Muscle strength is normal.  REFLEXES: Reflexes are 2+ and symmetric at the biceps, triceps,  1 at bilateral knees, and absent at  ankles. Plantar responses are flexor.  SENSORY: Mildly length dependent decreased light touch pinprick to ankle level  COORDINATION: Rapid alternating movements and fine finger movements are intact. There is no dysmetria on finger-to-nose and heel-knee-shin.    GAIT/STANCE: Posture is  hesitate, moderate stride, able to stand up on heel and toe, he could not perform tandem walking, Romberg signs was negative  DIAGNOSTIC DATA (LABS, IMAGING, TESTING) - I reviewed patient records, labs, notes, testing and imaging myself where available.   ASSESSMENT AND PLAN  BRANTLY KALMAN is a 60 y.o. male    Diabetic peripheral neuropathy  Repeat A1c  EMG nerve conduction study Confusion episode  EEG     Marcial Pacas, M.D. Ph.D.  [facility] [address], [place], [place] [address] Ph: [phone] Fax: [phone]  CC: [name], MD

## 2015-06-29 ENCOUNTER — Ambulatory Visit (HOSPITAL_COMMUNITY)
Admission: RE | Admit: 2015-06-29 | Discharge: 2015-06-29 | Disposition: A | Discharge status: Home / Self Care | Payer: BLUE CROSS/BLUE SHIELD | Source: Ambulatory Visit | Attending: Cardiovascular Disease | Admitting: Cardiovascular Disease

## 2015-06-29 DIAGNOSIS — R0609 Other forms of dyspnea: Secondary | ICD-10-CM | POA: Diagnosis not present

## 2015-06-29 DIAGNOSIS — R42 Dizziness and giddiness: Secondary | ICD-10-CM | POA: Insufficient documentation

## 2015-06-29 DIAGNOSIS — N183 Chronic kidney disease, stage 3 (moderate): Secondary | ICD-10-CM | POA: Diagnosis not present

## 2015-06-29 DIAGNOSIS — I739 Peripheral vascular disease, unspecified: Secondary | ICD-10-CM | POA: Insufficient documentation

## 2015-06-29 DIAGNOSIS — R531 Weakness: Secondary | ICD-10-CM | POA: Diagnosis not present

## 2015-06-29 DIAGNOSIS — R0602 Shortness of breath: Secondary | ICD-10-CM | POA: Diagnosis not present

## 2015-06-29 DIAGNOSIS — E119 Type 2 diabetes mellitus without complications: Secondary | ICD-10-CM | POA: Insufficient documentation

## 2015-06-29 DIAGNOSIS — Z87891 Personal history of nicotine dependence: Secondary | ICD-10-CM | POA: Diagnosis not present

## 2015-06-29 DIAGNOSIS — R5383 Other fatigue: Secondary | ICD-10-CM | POA: Insufficient documentation

## 2015-06-29 LAB — MYOCARDIAL PERFUSION IMAGING
CSEPPHR: 92 {beats}/min
NUC STRESS TID: 1.14
Rest HR: 69 {beats}/min
SDS: 0
SRS: 0
SSS: 0

## 2015-06-29 LAB — HEMOGLOBIN A1C
ESTIMATED AVERAGE GLUCOSE: 160 mg/dL
HEMOGLOBIN A1C: 7.2 % — AB (ref 4.8–5.6)

## 2015-06-29 MED ORDER — TECHNETIUM TC 99M SESTAMIBI GENERIC - CARDIOLITE
30.1000 | Freq: Once | INTRAVENOUS | Status: AC | PRN
  Administered 2015-06-29: 30.1 via INTRAVENOUS

## 2015-06-29 MED ORDER — TECHNETIUM TC 99M SESTAMIBI GENERIC - CARDIOLITE
10.0000 | Freq: Once | INTRAVENOUS | Status: AC | PRN
  Administered 2015-06-29: 10 via INTRAVENOUS

## 2015-06-29 MED ORDER — REGADENOSON 0.4 MG/5ML IV SOLN
0.4000 mg | Freq: Once | INTRAVENOUS | Status: AC
  Administered 2015-06-29: 0.4 mg via INTRAVENOUS

## 2015-06-29 MED ORDER — AMINOPHYLLINE 25 MG/ML IV SOLN
75.0000 mg | Freq: Once | INTRAVENOUS | Status: AC
  Administered 2015-06-29: 75 mg via INTRAVENOUS

## 2015-07-04 ENCOUNTER — Telehealth: Payer: Self-pay | Admitting: Cardiovascular Disease

## 2015-07-04 NOTE — Telephone Encounter (Signed)
Returning your call. °

## 2015-07-04 NOTE — Telephone Encounter (Signed)
Left message to call back  

## 2015-07-06 NOTE — Telephone Encounter (Signed)
Left message to call back  

## 2015-07-10 ENCOUNTER — Ambulatory Visit (INDEPENDENT_AMBULATORY_CARE_PROVIDER_SITE_OTHER): Payer: BLUE CROSS/BLUE SHIELD

## 2015-07-10 NOTE — Patient Instructions (Signed)
     IF you received an x-ray today, you will receive an invoice from [facility]. Please contact [facility] at [phone] with questions or concerns regarding your invoice.   IF you received labwork today, you will receive an invoice from [facility]. Please contact [facility] at [phone] with questions or concerns regarding your invoice.   Our billing staff will not be able to assist you with questions regarding bills from these companies.  You will be contacted with the lab results as soon as they are available. The fastest way to get your results is to activate your My Chart account. Instructions are located on the last page of this paperwork. If you have not heard from us regarding the results in 2 weeks, please contact this office.      

## 2015-07-11 ENCOUNTER — Telehealth: Payer: Self-pay | Admitting: Cardiovascular Disease

## 2015-07-11 ENCOUNTER — Ambulatory Visit (INDEPENDENT_AMBULATORY_CARE_PROVIDER_SITE_OTHER): Payer: BLUE CROSS/BLUE SHIELD | Admitting: Emergency Medicine

## 2015-07-11 VITALS — BP 133/82 | HR 80 | Temp 97.8°F | Resp 16 | Ht 70.0 in | Wt 222.0 lb

## 2015-07-11 DIAGNOSIS — R739 Hyperglycemia, unspecified: Secondary | ICD-10-CM | POA: Diagnosis not present

## 2015-07-11 DIAGNOSIS — D473 Essential (hemorrhagic) thrombocythemia: Secondary | ICD-10-CM

## 2015-07-11 DIAGNOSIS — R319 Hematuria, unspecified: Secondary | ICD-10-CM

## 2015-07-11 DIAGNOSIS — R55 Syncope and collapse: Secondary | ICD-10-CM

## 2015-07-11 DIAGNOSIS — R748 Abnormal levels of other serum enzymes: Secondary | ICD-10-CM | POA: Diagnosis not present

## 2015-07-11 DIAGNOSIS — D75839 Thrombocytosis, unspecified: Secondary | ICD-10-CM

## 2015-07-11 LAB — POCT CBC
Granulocyte percent: 51 %G (ref 37–80)
HCT, POC: 43.3 % — AB (ref 43.5–53.7)
Hemoglobin: 15.3 g/dL (ref 14.1–18.1)
Lymph, poc: 2.7 (ref 0.6–3.4)
MCH, POC: 34.3 pg — AB (ref 27–31.2)
MCHC: 35.2 g/dL (ref 31.8–35.4)
MCV: 97.2 fL — AB (ref 80–97)
MID (cbc): 0.5 (ref 0–0.9)
MPV: 5.9 fL (ref 0–99.8)
POC Granulocyte: 3.3 (ref 2–6.9)
POC LYMPH PERCENT: 41.6 %L (ref 10–50)
POC MID %: 7.4 %M (ref 0–12)
Platelet Count, POC: 131 10*3/uL — AB (ref 142–424)
RBC: 4.45 M/uL — AB (ref 4.69–6.13)
RDW, POC: 14.4 %
WBC: 6.5 10*3/uL (ref 4.6–10.2)

## 2015-07-11 LAB — POC MICROSCOPIC URINALYSIS (UMFC): Mucus: ABSENT

## 2015-07-11 LAB — CK: CK TOTAL: 19 U/L (ref 7–232)

## 2015-07-11 LAB — POCT URINALYSIS DIP (MANUAL ENTRY)
Bilirubin, UA: NEGATIVE
Glucose, UA: 250 — AB
Ketones, POC UA: NEGATIVE
LEUKOCYTES UA: NEGATIVE
NITRITE UA: NEGATIVE
PH UA: 5.5
Spec Grav, UA: 1.025
UROBILINOGEN UA: 1

## 2015-07-11 LAB — TSH: TSH: 0.19 m[IU]/L — AB (ref 0.40–4.50)

## 2015-07-11 LAB — GLUCOSE, POCT (MANUAL RESULT ENTRY): POC Glucose: 149 mg/dl — AB (ref 70–99)

## 2015-07-11 NOTE — Progress Notes (Addendum)
By signing my name below, I, [name], attest that this documentation has been prepared under the direction and in the presence of Arlyss Queen, MD. Electronically Signed: [name], [place]. 07/11/2015 , 1:36 PM .  Patient was seen in room 1 .  Chief Complaint:  Chief Complaint  Patient presents with  . Follow-up  . Diabetes  . Depression    per screen    HPI: Daniel [name] is a 60 y.o. male who reports to [facility] today for follow up on diabetes.  He's only smoked cigars. He denies ever smoking cigarettes.   Right Foot swelling He notes his right foot is still swollen, as well as his right leg. He denies any known injuries.    Specialists He had psychiatrist appointment yesterday (07/10/15); electric shock therapy was discussed for his depression.  He saw his cardiologist (Dr. [name]) and had stress test done; it was normal.  He saw nephrologist and kidney function was normal.  He had sleep study done, and is getting a new cpap machine.  He plans to see [name] (Infectious Disease) in June.  He saw ENT and needs hearing aids.  He had cortisone shot in both knees, done by Dr. Percell Miller. He notes it's doing well.  He saw [name] and had MRI done for his back, and reports having arthritis.    Hospital Follow-up He fell on 06/20/15, which was 3 weeks ago. He went to [facility]. He had multiple tests done. He was discharged 2 days after. Within the same day, he was at a friend's house, and fell towards his left side. He had a repeat of this weakness 2 days later (06/24/15) when he was at home. He didn't call 911 at that time, and waited until he regained strength to help himself back up. He's had 3 episodes of this weakness and falling.    Past Medical History  Diagnosis Date  . Diabetes mellitus without complication ([place])   . ADHD (attention deficit hyperactivity disorder)   . Depression   . GERD (gastroesophageal reflux disease)   . HIV infection ([place])     . Ulcer   . Anxiety   . Clotting disorder ([place])   . Dizziness 03/17/2015   Past Surgical History  Procedure Laterality Date  . Small intestine surgery     Social History   Social History  . Marital Status: Single    Spouse Name: N/A  . Number of Children: N/A  . Years of Education: N/A   Social History Main Topics  . Smoking status: Former Smoker -- 0.10 packs/day for 10 years    Types: Cigars, Cigarettes  . Smokeless tobacco: Never Used  . Alcohol Use: No  . Drug Use: No  . Sexual Activity:    Partners: Male     Comment: pt. declined condoms   Other Topics Concern  . None   Social History Narrative   Epworth Sleepiness Scale = 7 (as of 03/16/2015)   Family History  Problem Relation Age of Onset  . Cancer Brother   . COPD Mother    Allergies  Allergen Reactions  . Aspirin Swelling  . Ibuprofen Swelling  . Sustiva [Efavirenz] Rash and Swelling    And rash.  . Acetaminophen     States he was told by Nephrologist not to use Tylenol due to CKD.    . Nsaids Other (See Comments)    unknwn   Prior to Admission medications   Medication Sig  Start Date End Date Taking? Authorizing Provider  abacavir-dolutegravir-lamiVUDine (TRIUMEQ) 600-50-300 MG tablet Take 1 tablet by mouth daily. 03/14/15   Thayer Headings, MD  ALPRAZolam Duanne Moron) 1 MG tablet Take 1 mg by mouth 3 (three) times daily as needed for anxiety.     Historical Provider, MD  amphetamine-dextroamphetamine (ADDERALL) 30 MG tablet Take 30 mg by mouth 3 (three) times daily.     Historical Provider, MD  atorvastatin (LIPITOR) 10 MG tablet TAKE ONE TABLET BY MOUTH ONCE DAILY. 01/12/15   Tishira R Brewington, PA-C  Blood Glucose Monitoring Suppl (BLOOD GLUCOSE METER KIT AND SUPPLIES) Dispense based on patient and insurance preference. Use up to four times daily as directed. (FOR ICD-9 250.00, 250.01). 09/08/13   [name], MD  glucose blood test strip Test blood sugar daily. Dx E11.9 01/10/15   Tishira R Brewington,  PA-C  Insulin Pen Needle (PEN NEEDLES 3/16") 31G X 5 MM MISC Use to inject Lantus daily. 03/30/15   [name], MD  LANTUS SOLOSTAR 100 UNIT/ML Solostar Pen INJECT 30 UNITS SUBCUTANEOUSLY AT BEDTIME. 03/01/15   [name], MD  levothyroxine (SYNTHROID, LEVOTHROID) 75 MCG tablet Take 75 mcg by mouth at bedtime.    Historical Provider, MD  LYRICA 75 MG capsule TAKE ONE CAPSULE BY MOUTH THREE DAILY. 05/08/15   [name], MD  oxyCODONE (OXY IR/ROXICODONE) 5 MG immediate release tablet Take 1 tablet (5 mg total) by mouth 2 (two) times daily. 06/01/15   [name], MD  prochlorperazine (COMPAZINE) 10 MG tablet TAKE ONE TABLET BY MOUTH EVERY 6 HOURS AS NEEDED. 11/23/14   Thayer Headings, MD  ranitidine (ZANTAC) 150 MG tablet Take 150 mg by mouth daily.    Historical Provider, MD  rivaroxaban (XARELTO) 20 MG TABS tablet Take 1 tablet (20 mg total) by mouth daily. 03/13/15   [name], MD  TRINTELLIX 20 MG TABS Take 20 mg by mouth at bedtime. 01/26/15   Historical Provider, MD  UNABLE TO FIND CPAP MACHINE with standard Aclaim nasal mask with humidifier. Set at 14 cwp 04/12/13   [name], MD  VOLTAREN 1 % GEL APPLY 2 GRAMS TO EACH KNEE IN THE MORNING AND AT BEDTIME AND APPLY 1 GRAM TO EACH KNEE IN THE AFTERNOON. 02/23/15   [name], MD  zolpidem (AMBIEN) 10 MG tablet Take 10-20 mg by mouth at bedtime as needed for sleep.     Historical Provider, MD     ROS:  Constitutional: negative for fever, chills, night sweats, weight changes, or fatigue  HEENT: negative for vision changes, hearing loss, congestion, rhinorrhea, ST, epistaxis, or sinus pressure Cardiovascular: negative for chest pain or palpitations Respiratory: negative for hemoptysis, wheezing, shortness of breath, or cough Abdominal: negative for abdominal pain, nausea, vomiting, diarrhea, or constipation Dermatological: negative for rash Musc: positive for pedal edema Neurologic: negative for headache, dizziness, or syncope All  other systems reviewed and are otherwise negative with the exception to those above and in the HPI.  PHYSICAL EXAM: Filed Vitals:   07/11/15 1226  BP: 133/82  Pulse: 80  Temp: 97.8 F (36.6 C)  Resp: 16   Body mass index is 31.85 kg/(m^2).   General: Alert, no acute distress HEENT:  Normocephalic, atraumatic, oropharynx patent. Eye: Juliette Mangle [facility] Cardiovascular:  Regular rate and rhythm, no rubs murmurs or gallops.  No Carotid bruits, radial pulse intact. No pedal edema.  Respiratory: Clear to auscultation bilaterally.  No wheezes, rales, or rhonchi.  No cyanosis, no use of accessory musculature Abdominal: No organomegaly, abdomen is soft and non-tender, positive bowel sounds. No masses. Musculoskeletal: Gait intact. Very poor feeling in his feet with bluish discoloration, fungal deformity of his toenails Skin: No rashes. Neurologic: Facial musculature symmetric. Psychiatric: Patient acts appropriately throughout our interaction.  Lymphatic: No cervical or submandibular lymphadenopathy Genitourinary/Anorectal: No acute findings  LABS: Results for orders placed or performed in visit on 07/11/15  POCT CBC  Result Value Ref Range   WBC 6.5 4.6 - 10.2 K/uL   Lymph, poc 2.7 0.6 - 3.4   POC LYMPH PERCENT 41.6 10 - 50 %L   MID (cbc) 0.5 0 - 0.9   POC MID % 7.4 0 - 12 %M   POC Granulocyte 3.3 2 - 6.9   Granulocyte percent 51.0 37 - 80 %G   RBC 4.45 (A) 4.69 - 6.13 M/uL   Hemoglobin 15.3 14.1 - 18.1 g/dL   HCT, POC 43.3 (A) 43.5 - 53.7 %   MCV 97.2 (A) 80 - 97 fL   MCH, POC 34.3 (A) 27 - 31.2 pg   MCHC 35.2 31.8 - 35.4 g/dL   RDW, POC 14.4 %   Platelet Count, POC 131 (A) 142 - 424 K/uL   MPV 5.9 0 - 99.8 fL  POCT glucose (manual entry)  Result Value Ref Range   POC Glucose 149 (A) 70 - 99 mg/dl  POCT urinalysis dipstick  Result Value Ref Range   Color, UA yellow yellow   Clarity, UA clear clear   Glucose, UA =250 (A) negative   Bilirubin, UA negative negative   Ketones,  POC UA negative negative   Spec Grav, UA 1.025    Blood, UA small (A) negative   pH, UA 5.5    Protein Ur, POC =100 (A) negative   Urobilinogen, UA 1.0    Nitrite, UA Negative Negative   Leukocytes, UA Negative Negative   Results for orders placed or performed in visit on 07/11/15  POCT CBC  Result Value Ref Range   WBC 6.5 4.6 - 10.2 K/uL   Lymph, poc 2.7 0.6 - 3.4   POC LYMPH PERCENT 41.6 10 - 50 %L   MID (cbc) 0.5 0 - 0.9   POC MID % 7.4 0 - 12 %M   POC Granulocyte 3.3 2 - 6.9   Granulocyte percent 51.0 37 - 80 %G   RBC 4.45 (A) 4.69 - 6.13 M/uL   Hemoglobin 15.3 14.1 - 18.1 g/dL   HCT, POC 43.3 (A) 43.5 - 53.7 %   MCV 97.2 (A) 80 - 97 fL   MCH, POC 34.3 (A) 27 - 31.2 pg   MCHC 35.2 31.8 - 35.4 g/dL   RDW, POC 14.4 %   Platelet Count, POC 131 (A) 142 - 424 K/uL   MPV 5.9 0 - 99.8 fL  POCT glucose (manual entry)  Result Value Ref Range   POC Glucose 149 (A) 70 - 99 mg/dl  POCT urinalysis dipstick  Result Value Ref Range   Color, UA yellow yellow   Clarity, UA clear clear   Glucose, UA =250 (A) negative   Bilirubin, UA negative negative   Ketones, POC UA negative negative   Spec Grav, UA 1.025    Blood, UA small (A) negative   pH, UA 5.5    Protein Ur, POC =100 (A) negative   Urobilinogen, UA 1.0    Nitrite, UA Negative Negative   Leukocytes, UA Negative Negative  POCT Microscopic  Urinalysis ([facility])  Result Value Ref Range   WBC,UR,HPF,POC None None WBC/hpf   RBC,UR,HPF,POC None None RBC/hpf   Bacteria None None, Too numerous to count   Mucus Absent Absent   Epithelial Cells, UR Per Microscopy Few (A) None, Too numerous to count cells/hpf    EKG/XRAY:     ASSESSMENT/PLAN: Patient has had 3 episodes in the last 9 months where he had episodes of syncope inability to stand and required hospitalization for 2 of the episodes. I am concerned about a possible arrhythmia as a trigger. I called Dr. Blenda Mounts office and we will see if we can get him in for application  of a Holter monitor. He may need one last for 30 days. He also has continued hematuria referral made back to urology. His last visit to the urologist did not go well. He is scheduled for EMG nerve conduction studies with his neurologist.I personally performed the services described in this documentation, which was scribed in my presence. The recorded information has been reviewed and is accurate. I went ahead and cancel his appointment to see the urologist. I didn't I asked him to have his house checked for carbon monoxide. When I called him he stated he had monitors in the house. He also is going to decide if he wants to have ECT therapy for his recalcitrant depression.Johney Maine sideeffects, risk and benefits, and alternatives of medications d/w patient. Patient is aware that all medications have potential sideeffects and we are unable to predict every sideeffect or drug-drug interaction that may occur.  Arlyss Queen MD 07/11/2015 1:36 PM

## 2015-07-11 NOTE — Telephone Encounter (Signed)
New Message  Dr Everlene Farrier requested to speak w/ RN concerning pt care. Please call back and discuss.

## 2015-07-11 NOTE — Telephone Encounter (Signed)
Please order 30 day Event monitor and schedule follow up with me in 5 weeks.

## 2015-07-11 NOTE — Telephone Encounter (Signed)
Spoke with Dr Everlene Farrier who saw patient today  He remains concerned with the 3 episodes of syncope in the past 9 months The last episode that he ended up at [facility] they contributed to pain medications but he had not had any 2-3 days prior  With one of the episodes he had FUO of 59 Dr Everlene Farrier concerned episodes coming from an arrhythmia, ? Monitor or be seen for evaluation Patient is also being worked up by neuro Will forward to Dr [name] for review

## 2015-07-12 ENCOUNTER — Ambulatory Visit: Payer: BLUE CROSS/BLUE SHIELD | Admitting: Neurology

## 2015-07-12 LAB — PSA: PSA: 1.14 ng/mL (ref <=4.00)

## 2015-07-12 LAB — SEDIMENTATION RATE: Sed Rate: 11 mm/hr (ref 0–20)

## 2015-07-12 NOTE — Telephone Encounter (Signed)
Patient never called back, had released results in [place]

## 2015-07-12 NOTE — Telephone Encounter (Signed)
Left message to call back  

## 2015-07-13 LAB — ERYTHROPOIETIN: Erythropoietin: 15.8 m[IU]/mL (ref 2.6–18.5)

## 2015-07-13 NOTE — Telephone Encounter (Signed)
Follow Up   Pt returned the call to assist

## 2015-07-13 NOTE — Telephone Encounter (Signed)
Left message to call back  

## 2015-07-17 ENCOUNTER — Ambulatory Visit (INDEPENDENT_AMBULATORY_CARE_PROVIDER_SITE_OTHER): Payer: Self-pay | Admitting: Neurology

## 2015-07-17 ENCOUNTER — Ambulatory Visit (INDEPENDENT_AMBULATORY_CARE_PROVIDER_SITE_OTHER): Payer: BLUE CROSS/BLUE SHIELD | Admitting: Neurology

## 2015-07-17 DIAGNOSIS — R2689 Other abnormalities of gait and mobility: Secondary | ICD-10-CM

## 2015-07-17 DIAGNOSIS — R202 Paresthesia of skin: Secondary | ICD-10-CM

## 2015-07-17 DIAGNOSIS — Z794 Long term (current) use of insulin: Secondary | ICD-10-CM

## 2015-07-17 DIAGNOSIS — R55 Syncope and collapse: Secondary | ICD-10-CM | POA: Diagnosis not present

## 2015-07-17 DIAGNOSIS — E0849 Diabetes mellitus due to underlying condition with other diabetic neurological complication: Secondary | ICD-10-CM

## 2015-07-17 DIAGNOSIS — R269 Unspecified abnormalities of gait and mobility: Secondary | ICD-10-CM | POA: Diagnosis not present

## 2015-07-17 DIAGNOSIS — R531 Weakness: Secondary | ICD-10-CM

## 2015-07-17 DIAGNOSIS — M545 Low back pain: Secondary | ICD-10-CM

## 2015-07-17 DIAGNOSIS — Z0289 Encounter for other administrative examinations: Secondary | ICD-10-CM

## 2015-07-17 DIAGNOSIS — IMO0002 Reserved for concepts with insufficient information to code with codable children: Secondary | ICD-10-CM

## 2015-07-17 MED ORDER — DIVALPROEX SODIUM ER 500 MG PO TB24: 500.0000 mg | ORAL_TABLET | Freq: Every day | ORAL | Status: DC

## 2015-07-17 NOTE — Progress Notes (Signed)
No chief complaint on file.     PATIENT: Daniel [name] DOB: 04-07-1955  No chief complaint on file.    HISTORICAL  Daniel [name] is a 60 years old right-handed male , seen in refer by Dr. Brett Fairy and his primary care physician Dr. Ivar Bury for evaluation of unsteady Gait in June 11 2015  I have reviewed and summarized referring note, he had a history of hypertension, hyperlipidemia, diabetes, HIV for more than 10 years, on treatment, most recent CD4 was 740, obstructive sleep apnea, using CPAP machine, hx of right leg DVT, PE on chronic anticoagulations, xelrato, he used to work for a [employer], which requires ladder climbing, last time he worked was December 25 2014.  He has some chronic low back pain, neck pain, but denies radiating pain to upper or lower extremity, he has bilateral feet paresthesia due to diabetic peripheral neuropathy for many years, denies bowel and bladder incontinence  In February 21 2015, he fell asleep on his sofa, when he tried to get up next morning, he had difficulty bearing weight, bilateral lower extremity weakness, he was taken to the [facility], his bilateral lower extremity gradually improved, he was discharged the same day, was able to ambulate again, there was no clear etiology found.  But since that event, he fell 3 times over the past few months, his right toes tend to be caught on the floor, sometimes without clear triggers.  I have personally reviewed MRI of the brain without contrast in March 2017, mild supratentorium small vessel disease,  right ventral pontine small vessel disease, less likely explaining his complains of gait difficulty   UPDATE May 4th 2017: Echocardiogram in February 2017: Ejection fraction 60-65%, word thickness was increased in a pattern of mild left ventricular hypertrophy.  We have personally reviewed MRI of lumbar in April 2017:  At L4-L5 there is moderate facet hypertrophy causing mild foraminal and  minimal lateral recess narrowing. There is no nerve root impingement.  L5-S1 there is a small midline disc protrusion and mild facet hypertrophy. There is mild right and minimal left foraminal narrowing. There does not appear to be any nerve root impingement.  Recent sleep study confirmed obstructive sleep apnea, CPAP is recommended  Today he complains lightheadedness with sudden positional change, intermittent bilateral feet paresthesia, he continue have falling episode, when they will cup from sleep, sofa, confused, could not get up  REVIEW OF SYSTEMS: Full 14 system review of systems performed and notable only for fatigue, hearing loss, shortness of breath, apnea, daytime sleepiness, sleep talking, joint pain, back pain, walking difficulty, numbness, agitation, decreased concentration, depression anxiety  ALLERGIES: Allergies  Allergen Reactions  . Aspirin Swelling  . Ibuprofen Swelling  . Sustiva [Efavirenz] Rash and Swelling    And rash.  . Acetaminophen     States he was told by Nephrologist not to use Tylenol due to CKD.    . Nsaids Other (See Comments)    unknwn    HOME MEDICATIONS: Current Outpatient Prescriptions  Medication Sig Dispense Refill  . abacavir-dolutegravir-lamiVUDine (TRIUMEQ) 600-50-300 MG tablet Take 1 tablet by mouth daily. 30 tablet 5  . ALPRAZolam (XANAX) 1 MG tablet Take 1 mg by mouth 3 (three) times daily as needed for anxiety.     [name] amphetamine-dextroamphetamine (ADDERALL) 30 MG tablet Take 30 mg by mouth 3 (three) times daily.     [name] atorvastatin (LIPITOR) 10 MG tablet TAKE ONE TABLET BY MOUTH ONCE DAILY. 90 tablet 11  .  Blood Glucose Monitoring Suppl (BLOOD GLUCOSE METER KIT AND SUPPLIES) Dispense based on patient and insurance preference. Use up to four times daily as directed. (FOR ICD-9 250.00, 250.01). 1 each 0  . glucose blood test strip Test blood sugar daily. Dx E11.9 50 each 11  . Insulin Pen Needle (PEN NEEDLES 3/16") 31G X 5 MM MISC Use to  inject Lantus daily. 100 each 3  . LANTUS SOLOSTAR 100 UNIT/ML Solostar Pen INJECT 30 UNITS SUBCUTANEOUSLY AT BEDTIME. 15 mL 2  . levothyroxine (SYNTHROID, LEVOTHROID) 75 MCG tablet Take 75 mcg by mouth at bedtime.    [name] LYRICA 75 MG capsule TAKE ONE CAPSULE BY MOUTH THREE DAILY. 90 capsule 0  . oxyCODONE (OXY IR/ROXICODONE) 5 MG immediate release tablet Take 1 tablet (5 mg total) by mouth 2 (two) times daily. 45 tablet 0  . prochlorperazine (COMPAZINE) 10 MG tablet TAKE ONE TABLET BY MOUTH EVERY 6 HOURS AS NEEDED. 120 tablet 0  . ranitidine (ZANTAC) 150 MG tablet Take 150 mg by mouth daily.    . rivaroxaban (XARELTO) 20 MG TABS tablet Take 1 tablet (20 mg total) by mouth daily. 30 tablet 5  . TRINTELLIX 20 MG TABS Take 20 mg by mouth at bedtime.    [name] UNABLE TO FIND CPAP MACHINE with standard Aclaim nasal mask with humidifier. Set at 14 cwp 1 each 0  . VOLTAREN 1 % GEL APPLY 2 GRAMS TO EACH KNEE IN THE MORNING AND AT BEDTIME AND APPLY 1 GRAM TO EACH KNEE IN THE AFTERNOON. 300 g 5  . zolpidem (AMBIEN) 10 MG tablet Take 10-20 mg by mouth at bedtime as needed for sleep.      No current facility-administered medications for this visit.    PAST MEDICAL HISTORY: Past Medical History  Diagnosis Date  . Diabetes mellitus without complication ([place])   . ADHD (attention deficit hyperactivity disorder)   . Depression   . GERD (gastroesophageal reflux disease)   . HIV infection ([place])   . Ulcer   . Anxiety   . Clotting disorder ([place])   . Dizziness 03/17/2015    PAST SURGICAL HISTORY: Past Surgical History  Procedure Laterality Date  . Small intestine surgery      FAMILY HISTORY: Family History  Problem Relation Age of Onset  . Cancer Brother   . COPD Mother     SOCIAL HISTORY:  Social History   Social History  . Marital Status: Single    Spouse Name: N/A  . Number of Children: N/A  . Years of Education: N/A   Occupational History  . Used to works for [employer] require ladder  climbing, last work was October 30 first 2016    Social History Main Topics  . Smoking status: Former Smoker -- 0.10 packs/day for 10 years    Types: Cigars, Cigarettes  . Smokeless tobacco: Never Used  . Alcohol Use: No  . Drug Use: No  . Sexual Activity:    Partners: Male     Comment: pt. declined condoms   Other Topics Concern  . Not on file   Social History Narrative   Epworth Sleepiness Scale = 7 (as of 03/16/2015)     PHYSICAL EXAM   There were no vitals filed for this visit.  Not recorded      There is no weight on file to calculate BMI.  PHYSICAL EXAMNIATION:  Gen: NAD, conversant, well nourised, obese, well groomed  Cardiovascular: Regular rate rhythm, no peripheral edema, warm, nontender. Eyes: Conjunctivae clear without exudates or hemorrhage Neck: Supple, no carotid bruise. Pulmonary: Clear to auscultation bilaterally   NEUROLOGICAL EXAM:  MENTAL STATUS: Speech:    Speech is normal; fluent and spontaneous with normal comprehension.  Cognition:     Orientation to time, place and person     Normal recent and remote memory     Normal Attention span and concentration     Normal Language, naming, repeating,spontaneous speech     Fund of knowledge   CRANIAL NERVES: CN II: Visual fields are full to confrontation. Fundoscopic exam is normal with sharp discs and no vascular changes. Pupils are round equal and briskly reactive to light. CN III, IV, VI: extraocular movement are normal. No ptosis. CN V: Facial sensation is intact to pinprick in all 3 divisions bilaterally. Corneal responses are intact.  CN VII: Face is symmetric with normal eye closure and smile. CN VIII: Hearing is normal to rubbing fingers CN IX, X: Palate elevates symmetrically. Phonation is normal. CN XI: Head turning and shoulder shrug are intact CN XII: Tongue is midline with normal movements and no atrophy.  MOTOR: There is no pronator drift of out-stretched arms.  Muscle bulk and tone are normal. Muscle strength is normal.  REFLEXES: Reflexes are 2+ and symmetric at the biceps, triceps,  1 at bilateral knees, and absent at  ankles. Plantar responses are flexor.  SENSORY: Mildly length dependent decreased light touch pinprick to ankle level  COORDINATION: Rapid alternating movements and fine finger movements are intact. There is no dysmetria on finger-to-nose and heel-knee-shin.    GAIT/STANCE: Posture is  hesitate, moderate stride, able to stand up on heel and toe, he could not perform tandem walking, Romberg signs was negative  DIAGNOSTIC DATA (LABS, IMAGING, TESTING) - I reviewed patient records, labs, notes, testing and imaging myself where available.   ASSESSMENT AND PLAN  Daniel [name] is a 60 y.o. male    Diabetic peripheral neuropathy  Repeat A1c  EMG nerve conduction study Confusion episode  EEG     Marcial Pacas, M.D. Ph.D.  [facility] [address] [place], [place] [address] Ph: [phone] Fax: [phone]  CC: [name], MD

## 2015-07-17 NOTE — Procedures (Signed)
   NCS (NERVE CONDUCTION STUDY) WITH EMG (ELECTROMYOGRAPHY) REPORT   STUDY DATE Jul 17 2015 PATIENT NAME: Daniel [name] DOB: [birth year] MRN: [id number]    TECHNOLOGIST: Laretta Alstrom ELECTROMYOGRAPHER: Marcial Pacas M.D.  CLINICAL INFORMATION:  60 year old male presented with chronic low back pain, history of diabetic peripheral neuropathy, HIV, intermittent gait difficulty, difficulty getting up from low position.  FINDINGS: NERVE CONDUCTION STUDY: Bilateral peroneal, sural sensory responses were absent. Right peroneal to EDB motor response showed normal distal C map amplitude, mildly decreased C map amplitude at proximal stimulation sites. Left peroneal to EDB, right tibial motor responses showed mildly decreased C map amplitude. Left tibial motor responses showed moderate decreased C map amplitude, with normal distal latency, conduction velocity. Bilateral tibial H reflexes were absent.    NEEDLE ELECTROMYOGRAPHY: Selected needle examinations were performed at bilateral lower extremity muscles and bilateral lumbar sacral paraspinal muscles.   Needle examination of bilateral tibialis anterior, tibialis posterior, medial gastrocnemius, vastus lateralis were normal.  There was no spontaneous activity at bilateral lumbar sacral paraspinal muscles, bilateral L4-5 S1.  IMPRESSION:   This is a mild abnormal study. There is electrodiagnostic evidence of mild axonal length dependent sensorimotor polyneuropathy, consistent with his history of diabetic peripheral neuropathy. There is no evidence of bilateral lumbar sacral radiculopathy, there is no evidence of inflammatory myopathy.   INTERPRETING PHYSICIAN:   Marcial Pacas M.D. Ph.D. [facility] [address], [place], [place] [address] [phone]

## 2015-07-17 NOTE — Telephone Encounter (Signed)
Left message to call back  

## 2015-07-17 NOTE — Progress Notes (Signed)
Electrodiagnostic study today showed evidence of mild axonal length dependent sensorimotor polyneuropathy. There is no evidence of lumbosacral radiculopathy, or inflammatory myopathy.

## 2015-07-17 NOTE — Telephone Encounter (Signed)
Returning your call  .[name] Thanks

## 2015-07-20 ENCOUNTER — Encounter: Payer: BLUE CROSS/BLUE SHIELD | Admitting: Cardiovascular Disease

## 2015-07-20 NOTE — Progress Notes (Signed)
Cardiology Office Note   Date:  07/20/2015   ID:  Daniel [name], Daniel [name] Sep 19, 1955, MRN [id number]  PCP:  Jenny Reichmann, MD  Cardiologist:   Skeet Latch, MD   No chief complaint on file.     Daniel [name] is a 60 y.o. male with HIV, DV/PE, and diabetes who presents for follow up on dizziness.   Interval History 06/18/15: Daniel [name] echo showed normal systolic function.  Carotid Dopppler showed mild plaque bilaterally.  His dizziness has improved and he thinks it was due to vertigo.  He denies chest pain recently.  He had some chest pain 10 years ago that was not due to a heart attack.  He notes shortness of breath with minimal exertion. This is been long-standing but is worse recently. [place] in the airport and had to recover for 30 minutes before catching his breath when walking to his gate.  Daniel [name] does not get any exercise, which he attributes to laziness.    Daniel [name] reports constant fatigue. His sleep patterns are very irregular. He watches TV all night and goes to sleep in the morning. He does note snoring. He had a sleep study last night and is awaiting the results.  History of Present Illness 03/16/15:  Daniel [name] reports dizziness that has been ongoing since September or October. He has suffered recurrent falls since that time.  When he changes position he feels as though the room is spinning. It is worse when he leans his head back or lays down. It also occurs when standing up quickly but resolves within a few moments.  He has fallen more times than he remember.  The last fall occurred two days ago.  He stood up after laying down and fell within a few moments of standing.  There was no preceding chest pain, shortness of breath or palpitations.  He has been taking meclizine but it isn't helping.  Daniel [name] was diagnosed with a DVT on 02/09/15.  He was started on Xarelto for anticoagulation.  He reports a history of DVT and PE 10 years ago.  He previously saw a  hematologist but never found out why he had blood clots.  He understands that he will be on Xarelto indefinitely.  He notes exertional shortness of breath that is long-standing.  He denies palpitations, lower extremity edema, orthopnea or PND.  Daniel [name] does not get any exercise.  He states that his diet is terrible.    Past Medical History  Diagnosis Date  . Diabetes mellitus without complication ([place])   . ADHD (attention deficit hyperactivity disorder)   . Depression   . GERD (gastroesophageal reflux disease)   . HIV infection ([place])   . Ulcer   . Anxiety   . Clotting disorder ([place])   . Dizziness 03/17/2015    Past Surgical History  Procedure Laterality Date  . Small intestine surgery       Current Outpatient Prescriptions  Medication Sig Dispense Refill  . abacavir-dolutegravir-lamiVUDine (TRIUMEQ) 600-50-300 MG tablet Take 1 tablet by mouth daily. 30 tablet 5  . ALPRAZolam (XANAX) 1 MG tablet Take 1 mg by mouth 3 (three) times daily as needed for anxiety.     [name] amphetamine-dextroamphetamine (ADDERALL) 30 MG tablet Take 30 mg by mouth 3 (three) times daily.     [name] atorvastatin (LIPITOR) 10 MG tablet TAKE ONE TABLET BY MOUTH ONCE DAILY. 90 tablet 11  . Blood Glucose Monitoring Suppl (BLOOD GLUCOSE METER KIT  AND SUPPLIES) Dispense based on patient and insurance preference. Use up to four times daily as directed. (FOR ICD-9 250.00, 250.01). 1 each 0  . divalproex (DEPAKOTE ER) 500 MG 24 hr tablet Take 1 tablet (500 mg total) by mouth at bedtime. 30 tablet 11  . glucose blood test strip Test blood sugar daily. Dx E11.9 50 each 11  . Insulin Pen Needle (PEN NEEDLES 3/16") 31G X 5 MM MISC Use to inject Lantus daily. 100 each 3  . LANTUS SOLOSTAR 100 UNIT/ML Solostar Pen INJECT 30 UNITS SUBCUTANEOUSLY AT BEDTIME. 15 mL 2  . levothyroxine (SYNTHROID, LEVOTHROID) 75 MCG tablet Take 75 mcg by mouth at bedtime.    [name] LYRICA 75 MG capsule TAKE ONE CAPSULE BY MOUTH THREE DAILY. 90 capsule 0  .  oxyCODONE (OXY IR/ROXICODONE) 5 MG immediate release tablet Take 1 tablet (5 mg total) by mouth 2 (two) times daily. 45 tablet 0  . prochlorperazine (COMPAZINE) 10 MG tablet TAKE ONE TABLET BY MOUTH EVERY 6 HOURS AS NEEDED. 120 tablet 0  . ranitidine (ZANTAC) 150 MG tablet Take 150 mg by mouth daily.    . rivaroxaban (XARELTO) 20 MG TABS tablet Take 1 tablet (20 mg total) by mouth daily. 30 tablet 5  . TRINTELLIX 20 MG TABS Take 20 mg by mouth at bedtime.    [name] UNABLE TO FIND CPAP MACHINE with standard Aclaim nasal mask with humidifier. Set at 14 cwp 1 each 0  . VOLTAREN 1 % GEL APPLY 2 GRAMS TO EACH KNEE IN THE MORNING AND AT BEDTIME AND APPLY 1 GRAM TO EACH KNEE IN THE AFTERNOON. 300 g 5  . zolpidem (AMBIEN) 10 MG tablet Take 10-20 mg by mouth at bedtime as needed for sleep.      No current facility-administered medications for this visit.    Allergies:   Aspirin; Ibuprofen; Sustiva; Acetaminophen; and Nsaids    Social History:  The patient  reports that he has quit smoking. His smoking use included Cigars and Cigarettes. He has a 1 pack-year smoking history. He has never used smokeless tobacco. He reports that he does not drink alcohol or use illicit drugs.   Family History:  The patient's family history includes COPD in his mother; Cancer in his brother.    ROS:  Please see the history of present illness.  Otherwise, review of systems are positive for none.   All other systems are reviewed and negative.    PHYSICAL EXAM: VS:  There were no vitals taken for this visit. , BMI There is no weight on file to calculate BMI.  Repeat BP 136/84 GENERAL:  Well appearing HEENT:  Pupils equal round and reactive, fundi not visualized, oral mucosa unremarkable NECK:  No jugular venous distention, waveform within normal limits, carotid upstroke brisk and symmetric, no bruits, no thyromegaly LYMPHATICS:  No cervical adenopathy LUNGS:  Clear to auscultation bilaterally HEART:  RRR.  PMI not  displaced or sustained,S1 and S2 within normal limits, no S3, no S4, no clicks, no rubs, no murmurs ABD:  Flat, positive bowel sounds normal in frequency in pitch, no bruits, no rebound, no guarding, no midline pulsatile mass, no hepatomegaly, no splenomegaly EXT:  2 plus pulses throughout, no edema, no cyanosis no clubbing SKIN:  No rashes no nodules NEURO:  Cranial nerves II through XII grossly intact, motor grossly intact throughout PSYCH:  Cognitively intact, oriented to person place and time  EKG:  EKG is ordered today. The ekg ordered today demonstrates sinus rhythm.  Rate 90 bpm.   Carotid Doppler 03/23/15: IMPRESSION: 1. Trace smooth heterogeneous plaque in the distal left common carotid artery. 2. No evidence of internal carotid plaque or stenosis bilaterally. 3. Vertebral arteries remain patent with normal antegrade flow.  Echo 04/05/15: Study Conclusions  - Left ventricle: The cavity size was normal. Wall thickness was  increased in a pattern of mild LVH. Systolic function was normal.  The estimated ejection fraction was in the range of 60% to 65%.  Biplane speckle tracking LVEF was not accurately measured and  therefore not reported. Wall motion was normal; there were no  regional wall motion abnormalities. Left ventricular diastolic  function parameters were normal for the patient&'s age. - Aortic valve: Mildly calcified annulus. Trileaflet. - Mitral valve: Calcified annulus. There was trivial regurgitation. - Right atrium: Central venous pressure (est): 3 mm Hg. - Tricuspid valve: There was physiologic regurgitation. - Pulmonary arteries: Systolic pressure could not be accurately  estimated. - Pericardium, extracardiac: There was no pericardial effusion.  Recent Labs: 02/22/2015: ALT 17; BUN 17; Creatinine, Ser 1.59*; Platelets 95*; Potassium 3.3*; Sodium 138 07/11/2015: Hemoglobin 15.3; TSH 0.19*    Lipid Panel    Component Value Date/Time   CHOL 206*  11/07/2013 1308   TRIG 206* 11/07/2013 1308   HDL 42 11/07/2013 1308   CHOLHDL 4.9 11/07/2013 1308   VLDL 41* 11/07/2013 1308   LDLCALC 123* 11/07/2013 1308      Wt Readings from Last 3 Encounters:  07/11/15 100.699 kg (222 lb)  07/10/15 100.789 kg (222 lb 3.2 oz)  06/29/15 100.699 kg (222 lb)      ASSESSMENT AND PLAN:  # Dizziness, vertigo: Symptoms improved with management of vertigo. Carotid ultrasound did not show obstructive disease.   # Hyperlipidemia: Continue atorvastatin.  We will check non-fasting lipids today.  Given that he is on HAART,  if he is not at goal, he likely will need to switch to high-dose pravastatin or pitavastatin.  # DVT: Recurrent, unprovoked DVT. Lifelong Xarelto.  # SOB: Mr. Wellen reports worsening exertional dyspnea. He has several risk factors for coronary artery disease including male gender, age, and hyperlipidemia. We will refer him for [facility]. He reports a history of several falls which were mostly due to BPPV, but he remains unsteady on his feet. He was noted to have grade 1 diastolic dysfunction on echo, but has no evidence of heart failure on exam.  # Elevated BP:  on initial check his blood pressure was 138/92. On repeat it was 136/84. He reports that it has been well-controlled overall. It might have been mildly elevated due to white coat hypertension. We will continue to monitor and not make any changes at this time.   Current medicines are reviewed at length with the patient today.  The patient does not have concerns regarding medicines.  The following changes have been made:  no change  Labs/ tests ordered today include:   No orders of the defined types were placed in this encounter.     Disposition:   FU with Yamira Papa C. [name], MD, [facility] in 6 months.   This note was written with the assistance of speech recognition software.  Please excuse any transcriptional errors.  Signed, Maximilian Tallo C. [name], MD, [facility]    07/20/2015 1:29 PM    [facility]  This encounter was created in error - please disregard.

## 2015-07-23 NOTE — Progress Notes (Signed)
Cardiology Office Note   Date:  07/25/2015   ID:  Jae, Skeet 1956/01/14, MRN [id number]  PCP:  Jenny Reichmann, MD  Cardiologist:   Skeet Latch, MD   Chief Complaint  Patient presents with  . Follow-up    SOB;when exertion CHEST PAIN;none. LIGHTHEADED/DIZZINESS;when getting up to fast. PAIN OR CRAMPS IN LEGS;cramps in legs. EDEMA;right leg.    Daniel FRANE is a 60 y.o. male with HIV, DV/PE (11/2014), OSA on CPAP, and diabetes who presents for follow up on dizziness.   History of Present Illness:  Daniel [name] was fist evaluated 02/2015 for dizziness and falls.  The symptoms had been ongoing for several months.  He felt as though the room was spinning when changing position, leaning his head back or laying down.  He was referred for an echo 03/2015 that revealed LVEF 60-65% with mild LVH.  He also had carotid Dopplers 02/2015 that revealed minimal plaque bilaterally.  Additionally, he reported exertional dyspnea so he was referred for [facility] which was negative for ischemia. In the interim Daniel [name] also had nerve conduction studies that revealed peripheral neuropathy consistent with diabetic neuropathy.   Daniel [name] reports that  His first episode of syncope occurred on 02/21/15.  There was no preceding chest pain, shortness of breath or palpitations.  The last thing he remembers he was leaning to the left and the he remembers waking up on his right side.  There was no loss of bowel or bladder function, tongue biting or post-ictal phase.  However, he was unable to move his legs for 6-8 hours after the episode and did have incontinence while laying there.  He had two recurrent events in May.  He followed up with his PCP, Dr. Arlyss Queen, who requested that an event monitor be placed due to recurrent syncope.  Daniel [name] still is not exercising.  He states that his diet is terrible and he eats too much ice cream.   Past Medical History  Diagnosis Date  . Diabetes  mellitus without complication ([place])   . ADHD (attention deficit hyperactivity disorder)   . Depression   . GERD (gastroesophageal reflux disease)   . HIV infection ([place])   . Ulcer   . Anxiety   . Clotting disorder ([place])   . Dizziness 03/17/2015  . OSA (obstructive sleep apnea) 07/25/2015    Uses CPAP regularly    Past Surgical History  Procedure Laterality Date  . Small intestine surgery       Current Outpatient Prescriptions  Medication Sig Dispense Refill  . abacavir-dolutegravir-lamiVUDine (TRIUMEQ) 600-50-300 MG tablet Take 1 tablet by mouth daily. 30 tablet 5  . ALPRAZolam (XANAX) 1 MG tablet Take 1 mg by mouth 3 (three) times daily as needed for anxiety.     [name] amphetamine-dextroamphetamine (ADDERALL) 30 MG tablet Take 30 mg by mouth 3 (three) times daily.     [name] atorvastatin (LIPITOR) 10 MG tablet TAKE ONE TABLET BY MOUTH ONCE DAILY. 90 tablet 11  . Blood Glucose Monitoring Suppl (BLOOD GLUCOSE METER KIT AND SUPPLIES) Dispense based on patient and insurance preference. Use up to four times daily as directed. (FOR ICD-9 250.00, 250.01). 1 each 0  . divalproex (DEPAKOTE ER) 500 MG 24 hr tablet Take 1 tablet (500 mg total) by mouth at bedtime. 30 tablet 11  . glucose blood test strip Test blood sugar daily. Dx E11.9 50 each 11  . Insulin Pen Needle (PEN NEEDLES 3/16") 31G X 5  MM MISC Use to inject Lantus daily. 100 each 3  . LANTUS SOLOSTAR 100 UNIT/ML Solostar Pen INJECT 30 UNITS SUBCUTANEOUSLY AT BEDTIME. (Patient taking differently: INJECT 40 UNITS SUBCUTANEOUSLY AT BEDTIME.) 15 mL 2  . levothyroxine (SYNTHROID, LEVOTHROID) 50 MCG tablet Take 1 tablet (50 mcg total) by mouth at bedtime. 30 tablet 11  . oxyCODONE (OXY IR/ROXICODONE) 5 MG immediate release tablet Take 1 tablet (5 mg total) by mouth 2 (two) times daily. 45 tablet 0  . prochlorperazine (COMPAZINE) 10 MG tablet TAKE ONE TABLET BY MOUTH EVERY 6 HOURS AS NEEDED. 120 tablet 0  . ranitidine (ZANTAC) 150 MG tablet Take 150  mg by mouth daily.    . rivaroxaban (XARELTO) 20 MG TABS tablet Take 1 tablet (20 mg total) by mouth daily. 30 tablet 5  . TRINTELLIX 20 MG TABS Take 20 mg by mouth at bedtime.    [name] UNABLE TO FIND CPAP MACHINE with standard Aclaim nasal mask with humidifier. Set at 14 cwp 1 each 0  . VOLTAREN 1 % GEL APPLY 2 GRAMS TO EACH KNEE IN THE MORNING AND AT BEDTIME AND APPLY 1 GRAM TO EACH KNEE IN THE AFTERNOON. 300 g 5  . zolpidem (AMBIEN) 10 MG tablet Take 10-20 mg by mouth at bedtime as needed for sleep.     . pregabalin (LYRICA) 75 MG capsule TAKE ONE CAPSULE BY MOUTH THREE DAILY. 90 capsule 3   No current facility-administered medications for this visit.    Allergies:   Aspirin; Ibuprofen; Sustiva; Acetaminophen; and Nsaids    Social History:  The patient  reports that he has quit smoking. His smoking use included Cigars and Cigarettes. He has a 1 pack-year smoking history. He has never used smokeless tobacco. He reports that he does not drink alcohol or use illicit drugs.   Family History:  The patient's family history includes COPD in his mother; Cancer in his brother.    ROS:  Please see the history of present illness.  Otherwise, review of systems are positive for chronic R LE edema.   All other systems are reviewed and negative.    PHYSICAL EXAM: VS:  BP 137/88 mmHg  Pulse 85  Ht 5' 10.5" (1.791 m)  Wt 229 lb 12.8 oz (104.237 kg)  BMI 32.50 kg/m2 , BMI Body mass index is 32.5 kg/(m^2).  Repeat BP 136/84 GENERAL:  Well appearing HEENT:  Pupils equal round and reactive, fundi not visualized, oral mucosa unremarkable NECK:  No jugular venous distention, waveform within normal limits, carotid upstroke brisk and symmetric, no bruits. LYMPHATICS:  No cervical adenopathy LUNGS:  Clear to auscultation bilaterally HEART:  RRR.  PMI not displaced or sustained,S1 and S2 within normal limits, no S3, no S4, no clicks, no rubs, no murmurs ABD:  Flat, positive bowel sounds normal in frequency in  pitch, no bruits, no rebound, no guarding, no midline pulsatile mass, no hepatomegaly, no splenomegaly EXT:  2 plus pulses throughout, no edema, no cyanosis no clubbing SKIN:  No rashes no nodules NEURO:  Cranial nerves II through XII grossly intact, motor grossly intact throughout PSYCH:  Cognitively intact, oriented to person place and time  EKG:  EKG is ordered today. The ekg ordered today demonstrates sinus rhythm.  Rate 90 bpm.   Carotid Doppler 03/23/15: IMPRESSION: 1. Trace smooth heterogeneous plaque in the distal left common carotid artery. 2. No evidence of internal carotid plaque or stenosis bilaterally. 3. Vertebral arteries remain patent with normal antegrade flow.  Echo 04/05/15: Study  Conclusions  - Left ventricle: The cavity size was normal. Wall thickness was  increased in a pattern of mild LVH. Systolic function was normal.  The estimated ejection fraction was in the range of 60% to 65%.  Biplane speckle tracking LVEF was not accurately measured and  therefore not reported. Wall motion was normal; there were no  regional wall motion abnormalities. Left ventricular diastolic  function parameters were normal for the patient&'s age. - Aortic valve: Mildly calcified annulus. Trileaflet. - Mitral valve: Calcified annulus. There was trivial regurgitation. - Right atrium: Central venous pressure (est): 3 mm Hg. - Tricuspid valve: There was physiologic regurgitation. - Pulmonary arteries: Systolic pressure could not be accurately  estimated. - Pericardium, extracardiac: There was no pericardial effusion.  Lexiscan Cardiolite 06/29/15:  There was no ST segment deviation noted during stress.  The study is normal.  This is a low risk study.  This study was not gated.   Recent Labs: 02/22/2015: ALT 17; BUN 17; Creatinine, Ser 1.59*; Platelets 95*; Potassium 3.3*; Sodium 138 07/11/2015: Hemoglobin 15.3; TSH 0.19*    Lipid Panel    Component Value Date/Time     CHOL 206* 11/07/2013 1308   TRIG 206* 11/07/2013 1308   HDL 42 11/07/2013 1308   CHOLHDL 4.9 11/07/2013 1308   VLDL 41* 11/07/2013 1308   LDLCALC 123* 11/07/2013 1308      Wt Readings from Last 3 Encounters:  07/25/15 229 lb 12.8 oz (104.237 kg)  07/11/15 222 lb (100.699 kg)  07/10/15 222 lb 3.2 oz (100.789 kg)      ASSESSMENT AND PLAN:  # Syncope: Carotid ultrasound did not show obstructive disease.  Echo and stress test were unremarkable.  His symptoms do not sound cardiac.  The fact that he has transient paralysis for 6-8 hours is not consistent with a cardiac etiology.  It is reasonable to place a 30 day event monitor.   # Hyperlipidemia: Continue atorvastatin.  We will check non-fasting lipids.  He is not fasting at this time so he will get them drawn at the [facility] office.  Given that he is on HAART,  if he is not at goal, he likely will need to switch to high-dose pravastatin or pitavastatin.  # DVT: Recurrent, unprovoked DVT. Lifelong Xarelto.  # SOB: Stress and echo were unremarkable. Symptoms are likely due to deconditioning and obesity.   Current medicines are reviewed at length with the patient today.  The patient does not have concerns regarding medicines.  The following changes have been made:  no change  Labs/ tests ordered today include:   Orders Placed This Encounter  Procedures  . Lipid panel  . Cardiac event monitor     Disposition:   FU with  C. [name], MD, [facility] in 6 weeks.   This note was written with the assistance of speech recognition soft theware.  Please excuse any transcriptional errors.  Signed,  C. [name], MD, [facility]  07/25/2015 1:40 PM    [facility]

## 2015-07-24 ENCOUNTER — Encounter: Payer: Self-pay | Admitting: *Deleted

## 2015-07-24 ENCOUNTER — Other Ambulatory Visit: Payer: Self-pay | Admitting: Emergency Medicine

## 2015-07-24 ENCOUNTER — Ambulatory Visit (INDEPENDENT_AMBULATORY_CARE_PROVIDER_SITE_OTHER): Payer: BLUE CROSS/BLUE SHIELD | Admitting: Neurology

## 2015-07-24 DIAGNOSIS — R269 Unspecified abnormalities of gait and mobility: Secondary | ICD-10-CM

## 2015-07-24 DIAGNOSIS — E0849 Diabetes mellitus due to underlying condition with other diabetic neurological complication: Secondary | ICD-10-CM

## 2015-07-24 DIAGNOSIS — Z794 Long term (current) use of insulin: Secondary | ICD-10-CM

## 2015-07-24 DIAGNOSIS — R55 Syncope and collapse: Secondary | ICD-10-CM

## 2015-07-24 DIAGNOSIS — IMO0002 Reserved for concepts with insufficient information to code with codable children: Secondary | ICD-10-CM

## 2015-07-24 DIAGNOSIS — E038 Other specified hypothyroidism: Secondary | ICD-10-CM

## 2015-07-24 MED ORDER — LEVOTHYROXINE SODIUM 50 MCG PO TABS: 50.0000 ug | ORAL_TABLET | Freq: Every day | ORAL | Status: DC

## 2015-07-25 ENCOUNTER — Encounter: Payer: Self-pay | Admitting: Cardiovascular Disease

## 2015-07-25 ENCOUNTER — Ambulatory Visit (INDEPENDENT_AMBULATORY_CARE_PROVIDER_SITE_OTHER): Payer: BLUE CROSS/BLUE SHIELD | Admitting: Cardiovascular Disease

## 2015-07-25 ENCOUNTER — Other Ambulatory Visit: Payer: Self-pay | Admitting: *Deleted

## 2015-07-25 ENCOUNTER — Encounter (INDEPENDENT_AMBULATORY_CARE_PROVIDER_SITE_OTHER): Payer: BLUE CROSS/BLUE SHIELD

## 2015-07-25 VITALS — BP 137/88 | HR 85 | Ht 70.5 in | Wt 229.8 lb

## 2015-07-25 DIAGNOSIS — G4733 Obstructive sleep apnea (adult) (pediatric): Secondary | ICD-10-CM | POA: Diagnosis not present

## 2015-07-25 DIAGNOSIS — I82401 Acute embolism and thrombosis of unspecified deep veins of right lower extremity: Secondary | ICD-10-CM | POA: Diagnosis not present

## 2015-07-25 DIAGNOSIS — R55 Syncope and collapse: Secondary | ICD-10-CM

## 2015-07-25 DIAGNOSIS — E785 Hyperlipidemia, unspecified: Secondary | ICD-10-CM

## 2015-07-25 HISTORY — DX: Obstructive sleep apnea (adult) (pediatric): G47.33

## 2015-07-25 MED ORDER — PREGABALIN 75 MG PO CAPS: ORAL_CAPSULE | ORAL | Status: DC

## 2015-07-25 NOTE — Patient Instructions (Addendum)
Medication Instructions:  Your physician recommends that you continue on your current medications as directed. Please refer to the Current Medication list given to you today.  Labwork: FASTING LIPID PANEL AT THE [facility] OFFICE SOON  Testing/Procedures: Your physician has recommended that you wear an event monitor. Event monitors are medical devices that record the heart's electrical activity. Doctors most often [name] these monitors to diagnose arrhythmias. Arrhythmias are problems with the speed or rhythm of the heartbeat. The monitor is a small, portable device. You can wear one while you do your normal daily activities. This is usually used to diagnose what is causing palpitations/syncope (passing out).  Follow-Up: Your physician recommends that you schedule a follow-up appointment in: [place]  If you need a refill on your cardiac medications before your next appointment, please call your pharmacy.

## 2015-07-27 NOTE — Procedures (Signed)
   HISTORY: 60 years old male, complains falling episodes without the collection of the event  TECHNIQUE:  16 channel EEG was performed based on standard 10-16 international system. One channel was dedicated to EKG, which has demonstrates normal sinus rhythm of beats per minutes.  Upon awakening, the posterior background activity was well-developed, in alpha range, 8 Hz,reactive to eye opening and closure.  There was no evidence of epileptiform discharge.  Photic stimulation was not performed  Hyperventilation was performed, there was no abnormality elicit.  No sleep was achieved.  CONCLUSION: This is a  normal awake EEG.  There is no electrodiagnostic evidence of epileptiform discharge

## 2015-08-01 ENCOUNTER — Telehealth: Payer: Self-pay | Admitting: Neurology

## 2015-08-01 NOTE — Telephone Encounter (Signed)
Ivin Booty Ferguson/[employer] [phone] ext [phone] called to check status of records request. Sent initial request with records release on 07/10/15 and a follow up request on 07/20/15. Is aware, Debra/Medical Records is out of the office until tomorrow.

## 2015-08-03 ENCOUNTER — Telehealth: Payer: Self-pay | Admitting: *Deleted

## 2015-08-03 ENCOUNTER — Telehealth: Payer: Self-pay | Admitting: Neurology

## 2015-08-03 ENCOUNTER — Institutional Professional Consult (permissible substitution): Payer: Self-pay | Admitting: Psychiatry

## 2015-08-03 NOTE — Telephone Encounter (Signed)
Records faxed to [facility] at [employer] on 08/03/15.

## 2015-08-03 NOTE — Telephone Encounter (Signed)
error 

## 2015-08-03 NOTE — Telephone Encounter (Signed)
Records faxed to [facility] on 06/009/17 attn Jamall Cannon.

## 2015-08-03 NOTE — Telephone Encounter (Signed)
Records faxed on 08/03/15.  Confirmation rec'vd 08/03/15 @ 14:47.

## 2015-08-03 NOTE — Telephone Encounter (Signed)
Records faxed, to [employer] attn Mack Guise on 08/03/15.

## 2015-08-06 ENCOUNTER — Other Ambulatory Visit: Payer: BLUE CROSS/BLUE SHIELD

## 2015-08-07 ENCOUNTER — Other Ambulatory Visit: Payer: BLUE CROSS/BLUE SHIELD

## 2015-08-07 DIAGNOSIS — Z113 Encounter for screening for infections with a predominantly sexual mode of transmission: Secondary | ICD-10-CM

## 2015-08-07 DIAGNOSIS — B20 Human immunodeficiency virus [HIV] disease: Secondary | ICD-10-CM

## 2015-08-07 LAB — CBC WITH DIFFERENTIAL/PLATELET
BASOS ABS: 0 {cells}/uL (ref 0–200)
Basophils Relative: 0 %
EOS PCT: 1 %
Eosinophils Absolute: 72 cells/uL (ref 15–500)
HCT: 45.7 % (ref 38.5–50.0)
HEMOGLOBIN: 16.1 g/dL (ref 13.2–17.1)
LYMPHS ABS: 2016 {cells}/uL (ref 850–3900)
Lymphocytes Relative: 28 %
MCH: 33.4 pg — AB (ref 27.0–33.0)
MCHC: 35.2 g/dL (ref 32.0–36.0)
MCV: 94.8 fL (ref 80.0–100.0)
MONOS PCT: 11 %
MPV: 9.1 fL (ref 7.5–12.5)
Monocytes Absolute: 792 cells/uL (ref 200–950)
NEUTROS ABS: 4320 {cells}/uL (ref 1500–7800)
NEUTROS PCT: 60 %
PLATELETS: 123 10*3/uL — AB (ref 140–400)
RBC: 4.82 MIL/uL (ref 4.20–5.80)
RDW: 14.6 % (ref 11.0–15.0)
WBC: 7.2 10*3/uL (ref 3.8–10.8)

## 2015-08-07 LAB — COMPREHENSIVE METABOLIC PANEL
ALK PHOS: 94 U/L (ref 40–115)
ALT: 18 U/L (ref 9–46)
AST: 15 U/L (ref 10–35)
Albumin: 3.9 g/dL (ref 3.6–5.1)
BILIRUBIN TOTAL: 0.9 mg/dL (ref 0.2–1.2)
BUN: 16 mg/dL (ref 7–25)
CALCIUM: 8.8 mg/dL (ref 8.6–10.3)
CO2: 25 mmol/L (ref 20–31)
CREATININE: 1.52 mg/dL — AB (ref 0.70–1.25)
Chloride: 103 mmol/L (ref 98–110)
GLUCOSE: 140 mg/dL — AB (ref 65–99)
Potassium: 4.3 mmol/L (ref 3.5–5.3)
Sodium: 138 mmol/L (ref 135–146)
Total Protein: 6.4 g/dL (ref 6.1–8.1)

## 2015-08-08 LAB — T-HELPER CELL (CD4) - (RCID CLINIC ONLY)
CD4 T CELL HELPER: 33 % (ref 33–55)
CD4 T Cell Abs: 680 /uL (ref 400–2700)

## 2015-08-08 LAB — URINE CYTOLOGY ANCILLARY ONLY
CHLAMYDIA, DNA PROBE: NEGATIVE
NEISSERIA GONORRHEA: NEGATIVE

## 2015-08-08 LAB — RPR

## 2015-08-09 ENCOUNTER — Other Ambulatory Visit: Payer: Self-pay | Admitting: Cardiovascular Disease

## 2015-08-09 ENCOUNTER — Ambulatory Visit (INDEPENDENT_AMBULATORY_CARE_PROVIDER_SITE_OTHER): Payer: BLUE CROSS/BLUE SHIELD | Admitting: Psychiatry

## 2015-08-09 ENCOUNTER — Encounter: Payer: Self-pay | Admitting: Psychiatry

## 2015-08-09 VITALS — BP 152/92 | HR 112 | Temp 99.3°F | Ht 70.5 in | Wt 234.8 lb

## 2015-08-09 DIAGNOSIS — F332 Major depressive disorder, recurrent severe without psychotic features: Secondary | ICD-10-CM | POA: Diagnosis not present

## 2015-08-09 LAB — HIV-1 RNA QUANT-NO REFLEX-BLD: HIV-1 RNA Quant, Log: <1.3 Log copies/mL (ref <1.30)

## 2015-08-09 NOTE — Progress Notes (Signed)
Psychiatric Initial Adult Assessment   Patient Identification: Daniel [name] MRN:  [id number] Date of Evaluation:  08/09/2015 Referral Source: Dr Toy Care  Chief Complaint:   Chief Complaint    Establish Care; Other     Visit Diagnosis:    ICD-9-CM ICD-10-CM   1. Severe recurrent major depression without psychotic features ([place]) 296.33 F33.2     History of Present Illness:  60 year old man referred to me for assessment of possible ECT treatment. Patient reports depression going back lifelong but especially bad for about the last 10 years. He has been on multiple medications with only partial improvement. He has never been hospitalized. He has not attempted suicide. Patient feels down and sad most of the time. Lots of fatigue. Doesn't enjoy very much about his life. Sleep is excessive. Appetite normal. No psychotic symptoms no hallucinations no suicidal thoughts no homicidal thoughts. Nervous much of the time. He is compliant with all of his medicine. Multiple stresses including his multiple medical problems. Patient is referred to consider ECT treatment. Recently has developed new medical problems. He has had several falls that are unexplained. He has seen a neurologist who has started him on an anticonvulsant medicine and is also getting a cardiac workup.  Associated Signs/Symptoms: Depression Symptoms:  depressed mood, anhedonia, hypersomnia, psychomotor retardation, fatigue, difficulty concentrating, (Hypo) Manic Symptoms:  None Anxiety Symptoms:  Excessive Worry, Psychotic Symptoms:  None PTSD Symptoms: Negative  Past Psychiatric History: No previous hospitalization. No suicide attempts. Multiple medications and treatment with her outpatient psychiatrist. Currently on Trintillex and Depakote and Xanax. No past history of ECT.  Previous Psychotropic Medications: Yes   Substance Abuse History in the last 12 months:  No.  Consequences of Substance Abuse: Negative  Past Medical  History:  Past Medical History  Diagnosis Date  . Diabetes mellitus without complication ([place])   . ADHD (attention deficit hyperactivity disorder)   . Depression   . GERD (gastroesophageal reflux disease)   . HIV infection ([place])   . Ulcer   . Anxiety   . Clotting disorder ([place])   . Dizziness 03/17/2015  . OSA (obstructive sleep apnea) 07/25/2015    Uses CPAP regularly  . Diabetes mellitus, type II ([place])   . Chronic kidney disease   . Liver disease     Past Surgical History  Procedure Laterality Date  . Small intestine surgery    . Stomach surgery      Family Psychiatric History: Family history is negative as far as he knows  Family History:  Family History  Problem Relation Age of Onset  . Cancer Brother   . Depression Brother   . COPD Mother     Social History:   Social History   Social History  . Marital Status: Single    Spouse Name: N/A  . Number of Children: N/A  . Years of Education: N/A   Social History Main Topics  . Smoking status: Former Smoker -- 0.10 packs/day for 10 years    Types: Cigars, Cigarettes    Quit date: 08/09/2014  . Smokeless tobacco: Never Used  . Alcohol Use: No  . Drug Use: No  . Sexual Activity:    Partners: Male     Comment: pt. declined condoms   Other Topics Concern  . None   Social History Narrative   Epworth Sleepiness Scale = 7 (as of 03/16/2015)    Additional Social History: Patient is currently living with some extended family including his brother and an aunt. He does  not work outside the home. Feels negative most of the time. He does not have anyone who can transport him to ECT.  Allergies:   Allergies  Allergen Reactions  . Aspirin Swelling  . Ibuprofen Swelling  . Sustiva [Efavirenz] Rash and Swelling    And rash.  . Acetaminophen     States he was told by Nephrologist not to use Tylenol due to CKD.    . Nsaids Other (See Comments)    unknwn    Metabolic Disorder Labs: Lab Results  Component Value Date    HGBA1C 7.2* 06/28/2015   No results found for: PROLACTIN Lab Results  Component Value Date   CHOL 206* 11/07/2013   TRIG 206* 11/07/2013   HDL 42 11/07/2013   CHOLHDL 4.9 11/07/2013   VLDL 41* 11/07/2013   LDLCALC 123* 11/07/2013   LDLCALC 87 06/21/2013     Current Medications: Current Outpatient Prescriptions  Medication Sig Dispense Refill  . abacavir-dolutegravir-lamiVUDine (TRIUMEQ) 600-50-300 MG tablet Take 1 tablet by mouth daily. 30 tablet 5  . ALPRAZolam (XANAX) 1 MG tablet Take 1 mg by mouth 3 (three) times daily as needed for anxiety.     [name] amphetamine-dextroamphetamine (ADDERALL) 30 MG tablet Take 30 mg by mouth 3 (three) times daily.     [name] atorvastatin (LIPITOR) 10 MG tablet TAKE ONE TABLET BY MOUTH ONCE DAILY. 90 tablet 11  . Blood Glucose Monitoring Suppl (BLOOD GLUCOSE METER KIT AND SUPPLIES) Dispense based on patient and insurance preference. Use up to four times daily as directed. (FOR ICD-9 250.00, 250.01). 1 each 0  . divalproex (DEPAKOTE ER) 500 MG 24 hr tablet Take 1 tablet (500 mg total) by mouth at bedtime. 30 tablet 11  . glucose blood test strip Test blood sugar daily. Dx E11.9 50 each 11  . Insulin Pen Needle (PEN NEEDLES 3/16") 31G X 5 MM MISC Use to inject Lantus daily. 100 each 3  . LANTUS SOLOSTAR 100 UNIT/ML Solostar Pen INJECT 30 UNITS SUBCUTANEOUSLY AT BEDTIME. 15 mL 0  . levothyroxine (SYNTHROID, LEVOTHROID) 50 MCG tablet Take 1 tablet (50 mcg total) by mouth at bedtime. 30 tablet 11  . oxyCODONE (OXY IR/ROXICODONE) 5 MG immediate release tablet Take 1 tablet (5 mg total) by mouth 2 (two) times daily. 45 tablet 0  . pregabalin (LYRICA) 75 MG capsule TAKE ONE CAPSULE BY MOUTH THREE DAILY. 90 capsule 3  . prochlorperazine (COMPAZINE) 10 MG tablet TAKE ONE TABLET BY MOUTH EVERY 6 HOURS AS NEEDED. 120 tablet 0  . ranitidine (ZANTAC) 150 MG tablet Take 150 mg by mouth daily.    . rivaroxaban (XARELTO) 20 MG TABS tablet Take 1 tablet (20 mg total) by  mouth daily. 30 tablet 5  . TRINTELLIX 20 MG TABS Take 20 mg by mouth at bedtime.    [name] UNABLE TO FIND CPAP MACHINE with standard Aclaim nasal mask with humidifier. Set at 14 cwp 1 each 0  . VOLTAREN 1 % GEL APPLY 2 GRAMS TO EACH KNEE IN THE MORNING AND AT BEDTIME AND APPLY 1 GRAM TO EACH KNEE IN THE AFTERNOON. 300 g 5  . zolpidem (AMBIEN) 10 MG tablet Take 10-20 mg by mouth at bedtime as needed for sleep.      No current facility-administered medications for this visit.    Neurologic: Headache: Negative Seizure: He is being worked up for this apparently and was started on antiseizure medicine Paresthesias:Negative  Musculoskeletal: Strength & Muscle Tone: within normal limits Gait & Station: normal Patient leans:  N/A  Psychiatric Specialty Exam: ROS  Blood pressure 152/92, pulse 112, temperature 99.3 F (37.4 C), temperature source Tympanic, height 5' 10.5" (1.791 m), weight 234 lb 12.8 oz (106.505 kg), SpO2 95 %.Body mass index is 33.2 kg/(m^2).  General Appearance: Fairly Groomed  Eye Contact:  Fair  Speech:  Clear and Coherent  Volume:  Normal  Mood:  Dysphoric  Affect:  Depressed  Thought Process:  Goal Directed  Orientation:  Full (Time, Place, and Person)  Thought Content:  Logical  Suicidal Thoughts:  No  Homicidal Thoughts:  No  Memory:  Immediate;   Good Recent;   Fair Remote;   Fair  Judgement:  Fair  Insight:  Fair  Psychomotor Activity:  Normal  Concentration:  Concentration: Fair  Recall:  [employer] of Knowledge:Fair  Language: Fair  Akathisia:  No  Handed:  Right  AIMS (if indicated):  None   Assets:  Communication Skills Desire for Improvement Housing  ADL's:  Intact  Cognition: WNL  Sleep:  Adequate in fact a little too much     Treatment Plan Summary: Plan Patient was here for an evaluation for ECT. He has major depression severe recurrent without psychotic features that has not responded to multiple medicine trials and is causing severe  impairment. From that standpoint he would be a reasonable candidate for ECT. Considerations that argue against it currently include his lack of transportation. He says he could not get anyone to transport him for treatment. Also he was recently started on Depakote and takes Xanax regularly. Furthermore he is getting a cardiac workup and has a Holter monitor on. I do not feel comfortable asking him to stop the Depakote or trying an ECT treatment until this whole medical issue is cleared up. Patient understood. We did not make any scheduled to start ECT. He was advised that if his medical situation and social situation changes he should get back in touch with me. I will relay this to his outpatient psychiatrist. No further appointments scheduled.   Alethia Berthold, MD 6/15/20177:36 PM

## 2015-08-10 LAB — LIPID PANEL
CHOL/HDL RATIO: 3.3 ratio (ref <=5.0)
CHOLESTEROL: 154 mg/dL (ref 125–200)
HDL: 47 mg/dL (ref >=40)
LDL CALC: 85 mg/dL (ref <130)
TRIGLYCERIDES: 108 mg/dL (ref <150)
VLDL: 22 mg/dL (ref <30)

## 2015-08-13 ENCOUNTER — Telehealth: Payer: Self-pay | Admitting: Cardiology

## 2015-08-13 NOTE — Telephone Encounter (Signed)
Monitoring company called pt had sent in [place] transmission for syncope - now not answering phone.  I called all his contacts and his niece will try to reach him.   He will call us.  His monitor revealed only SR and artifact no arrhthymias.

## 2015-08-13 NOTE — Telephone Encounter (Signed)
I don't think think his syncope is cardiogenic.

## 2015-08-14 ENCOUNTER — Telehealth: Payer: Self-pay | Admitting: *Deleted

## 2015-08-14 NOTE — Telephone Encounter (Signed)
-----   Message from Skeet Latch, MD sent at 08/10/2015  2:34 PM EDT ----- Cholesterol levels are much better.  Continue current medication.

## 2015-08-14 NOTE — Telephone Encounter (Signed)
Left message to call back  

## 2015-08-17 NOTE — Telephone Encounter (Signed)
Follow up ° ° ° ° ° °Returning a call to the nurse °

## 2015-08-17 NOTE — Telephone Encounter (Signed)
Advised patient of lab results  

## 2015-08-22 ENCOUNTER — Telehealth: Payer: Self-pay

## 2015-08-22 MED ORDER — INSULIN GLARGINE 100 UNIT/ML SOLOSTAR PEN: PEN_INJECTOR | SUBCUTANEOUS | Status: DC

## 2015-08-22 NOTE — Telephone Encounter (Signed)
Refilled Lantus Solostar Pen x 1 Does pt need to come in for labs or evaluation prior to next refill?

## 2015-08-22 NOTE — Telephone Encounter (Signed)
It would be good if I could see Daniel [name] sometime in July for an update on his medical problems.

## 2015-08-23 ENCOUNTER — Ambulatory Visit (INDEPENDENT_AMBULATORY_CARE_PROVIDER_SITE_OTHER): Payer: BLUE CROSS/BLUE SHIELD | Admitting: Internal Medicine

## 2015-08-23 ENCOUNTER — Encounter: Payer: Self-pay | Admitting: Internal Medicine

## 2015-08-23 VITALS — BP 146/91 | HR 90 | Temp 99.3°F | Wt 234.8 lb

## 2015-08-23 DIAGNOSIS — B20 Human immunodeficiency virus [HIV] disease: Secondary | ICD-10-CM

## 2015-08-23 DIAGNOSIS — N183 Chronic kidney disease, stage 3 unspecified: Secondary | ICD-10-CM

## 2015-08-23 DIAGNOSIS — N189 Chronic kidney disease, unspecified: Secondary | ICD-10-CM | POA: Diagnosis not present

## 2015-08-23 NOTE — Assessment & Plan Note (Signed)
Stable function.  Followed by nephrology.

## 2015-08-23 NOTE — Progress Notes (Signed)
   Subjective:    Patient ID: Daniel [name], male    DOB: [birth year], 60 y.o.   MRN: [id number]  HPI He comes in here for follow-up.    He had been on Stribild but with significantly elevated creatinine with normal BUN suggestive of intrinsic renal disease and he was changed to Triumeq.  He follows Dr. Clover Mealy at [facility] and reports doing well there.  Started on sz medication. Some depression, no suicidal ideation, follows with his psychiatrist.    Review of Systems  Constitutional: Negative for fatigue.  HENT: Negative for trouble swallowing.   Gastrointestinal: Negative for nausea and diarrhea.  Genitourinary: Negative for discharge.  Skin: Negative for rash.  Psychiatric/Behavioral: Negative for sleep disturbance.       Objective:   Physical Exam  Constitutional: He appears well-developed and well-nourished. No distress.  Eyes: No scleral icterus.  Cardiovascular: Normal rate, regular rhythm and normal heart sounds.   No murmur heard. Pulmonary/Chest: Effort normal and breath sounds normal. No respiratory distress.  Lymphadenopathy:    He has no cervical adenopathy.  Skin: No rash noted.   Social History   Social History  . Marital Status: Single    Spouse Name: N/A  . Number of Children: N/A  . Years of Education: N/A   Occupational History  . Not on file.   Social History Main Topics  . Smoking status: Former Smoker -- 0.10 packs/day for 10 years    Types: Cigars, Cigarettes    Quit date: 08/09/2014  . Smokeless tobacco: Never Used  . Alcohol Use: No  . Drug Use: No  . Sexual Activity:    Partners: Male     Comment: pt. declined condoms   Other Topics Concern  . Not on file   Social History Narrative   Epworth Sleepiness Scale = 7 (as of 03/16/2015)         Assessment & Plan:

## 2015-08-23 NOTE — Assessment & Plan Note (Signed)
Doing great.  rtc 6 months.

## 2015-08-27 NOTE — Telephone Encounter (Signed)
Called pt and advised message from provider on their voicemail.

## 2015-09-03 ENCOUNTER — Other Ambulatory Visit: Payer: Self-pay | Admitting: *Deleted

## 2015-09-03 DIAGNOSIS — Z0271 Encounter for disability determination: Secondary | ICD-10-CM

## 2015-09-03 DIAGNOSIS — B20 Human immunodeficiency virus [HIV] disease: Secondary | ICD-10-CM

## 2015-09-03 MED ORDER — ABACAVIR-DOLUTEGRAVIR-LAMIVUD 600-50-300 MG PO TABS: 1.0000 | ORAL_TABLET | Freq: Every day | ORAL | Status: DC

## 2015-09-04 ENCOUNTER — Encounter: Payer: Self-pay | Admitting: Podiatry

## 2015-09-04 ENCOUNTER — Ambulatory Visit (INDEPENDENT_AMBULATORY_CARE_PROVIDER_SITE_OTHER): Payer: BLUE CROSS/BLUE SHIELD | Admitting: Podiatry

## 2015-09-04 DIAGNOSIS — E1140 Type 2 diabetes mellitus with diabetic neuropathy, unspecified: Secondary | ICD-10-CM

## 2015-09-04 DIAGNOSIS — L89891 Pressure ulcer of other site, stage 1: Secondary | ICD-10-CM | POA: Diagnosis not present

## 2015-09-04 DIAGNOSIS — B351 Tinea unguium: Secondary | ICD-10-CM

## 2015-09-04 DIAGNOSIS — E1149 Type 2 diabetes mellitus with other diabetic neurological complication: Secondary | ICD-10-CM

## 2015-09-04 DIAGNOSIS — M79676 Pain in unspecified toe(s): Secondary | ICD-10-CM

## 2015-09-04 DIAGNOSIS — M79609 Pain in unspecified limb: Principal | ICD-10-CM

## 2015-09-04 MED ORDER — CEPHALEXIN 500 MG PO CAPS: 500.0000 mg | ORAL_CAPSULE | Freq: Two times a day (BID) | ORAL | Status: DC

## 2015-09-04 NOTE — Progress Notes (Signed)
Subjective:     Patient ID: Daniel [name], male   DOB: Jun 29, 1955, 60 y.o.   MRN: [id number]  HPI this patient presents to the office with chief complaint of bleeding coming from his right big toe. He states he was walking around in the kitchen and noticed on Sunday that there was blood on the floor. He says he believes that the toe has gotten infected  since it has smelled  Infected.  He presents walking into the office wearing bandage which still has blood on the bandage.  This patient was previously seen in March.  He was referred to return to the office in 3 months for diabetic foot care. He presents the office today for an evaluation of the long thick nails, as well as the bleeding skin lesion under his right big toe. This patient has previously been diagnosed with HIV diabetes mellitus, diabetic neuropathy and blood clot in his right leg. His right leg is red, swollen and increased in temperature, but he says his right leg has been like this since the blood clot. He also says he is now started to fall due to fainting of unknown etiology. He presents the office for an evaluation and treatment of both feet   Review of Systems     Objective:   Physical Exam GENERAL APPEARANCE: Alert, conversant. Appropriately groomed. No acute distress.  VASCULAR: Pedal pulses are  palpable at  [facility] and PT bilateral.  Capillary refill time is immediate to all digits,  Normal temperature gradient.   NEUROLOGIC: sensation is absent  to 5.07 monofilament at 5/5 sites bilateral.  Light touch is intact bilateral, Muscle strength normal.  MUSCULOSKELETAL: acceptable muscle strength, tone and stability bilateral.  Intrinsic muscluature intact bilateral.  Rectus appearance of foot and digits noted bilateral.   DERMATOLOGIC: skin color, texture, and turgor are within normal limits.  Pinch callus noted hallux B/L with right greater than left.  There is a 10 mm transverse fissure noted proximal to the callus area on the right  hallux. There is necrotic tissue noted at the edges of the fissure. Normal healthy granulation tissue is noted at the site of the fissure. No drainage at the fissure site NAILS  Thick disfigured discolored nails both feet.     Assessment:     Onychomycosis  Diabetic neuropathy.  Ulcer right hallux    Plan:     Debridement of onychomycosis B/L .  Debridement of necrotic tissue right hallux.  Neosporin/DSD.  Prescribe cephalexin # 20.  Home soaking and bandaging instructions.  RTC 2 weeks.   Gardiner Barefoot DPM

## 2015-09-05 ENCOUNTER — Encounter: Payer: Self-pay | Admitting: Neurology

## 2015-09-05 ENCOUNTER — Ambulatory Visit (INDEPENDENT_AMBULATORY_CARE_PROVIDER_SITE_OTHER): Payer: BLUE CROSS/BLUE SHIELD | Admitting: Neurology

## 2015-09-05 VITALS — BP 138/72 | HR 86 | Resp 20 | Ht 70.5 in | Wt 234.0 lb

## 2015-09-05 DIAGNOSIS — G4733 Obstructive sleep apnea (adult) (pediatric): Secondary | ICD-10-CM

## 2015-09-05 DIAGNOSIS — Z9989 Dependence on other enabling machines and devices: Principal | ICD-10-CM

## 2015-09-05 DIAGNOSIS — F331 Major depressive disorder, recurrent, moderate: Secondary | ICD-10-CM

## 2015-09-05 NOTE — Progress Notes (Signed)
SLEEP MEDICINE CLINIC   Provider:  Larey Seat, M D  Referring Provider: [name], MD Primary Care Physician:  Jenny Reichmann, MD  Chief Complaint  Patient presents with  . Follow-up    cpap, going well    HPI:  Daniel [name] is a 60 y.o. male , originally seen here as a referral from Dr. Everlene Farrier for a sleep consultation,  Daniel [name] reports that over 10 years ago he had a sleep study and he was prescribed a CPAP machine. He has been using the CPAP ever since about 14 years ago he had is very first sleep study and was the result of the second set left tubes a prescription of his current machine. A [employer] manufactured Sullivan CPAP. By appearance over 62 years old. [facility] provided that machine that the patient often also had to use mail orders to fill his supply needs. He is definitely due for a new machine. In January 2014 over 3 years ago, he saw [name] here at the office for a neuropathy evaluation with foot drop. She concluded that this was either an HIV or diabetic induced neuropathy the results are scanned into epic for Dr. Everlene Farrier, at that time also prescribing physician. Daniel [name] has no routines in terms of sleep wake rhythm. 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  Sleep habits are as follows: Some nights Daniel [name] may watch TV all night through another night he may go to bed as early as 8 or 9 PM. There is no established sleep routine. Daniel [name] and his brother lives with her 42 year old aunt. He doesn't want to establish routines. He is not bothered by his living arrangements. His room is bedroom, Tv and computer room.  When he sleeps, he will be in bed by 3 or 4 AM and sleeps 7-8 hours . Eats oatmeal in AM and sometimes returns to bed to sleep there all night and day. He sleeps in all positions, 2 pillowes. His bedroom is not  cool , nor quiet - he used light blocking shades.  He has a illuminated bedroom, doesn't watch TV in the dark 240 watts.  His  psychiatrist is Dr. Toy Care who has been trying to work on this for years he reports. He is disinterested in most things in life.    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 Social history: Daniel [name] is currently unemployed, he drinks juices but no coffee or other "caffeinated  Beverages", he likes milk and ice cream. He than advised me , he  drinks iced tea -which has caffeine. Last year during I montage in [place] he left of a lot of fast food but couldn't get his beloved iced tea, his sugar diabetes decompensated completely. His weight was 208 pounds by October 2016 he reports. After his return he was placed on insulin.    Interval history from 09/05/2015. Daniel [name] reports that he has received some steroid shots into his knees which has helped this pain he also has used a topical pain cream which has helped 2. He has seen my colleague [name]. He is here today for a compliance visit on CPAP after a split-night polysomnography from 06/17/2015. He was diagnosed with a moderate degree of  apnea, his AHI was 20.2 and consisted almost entirely of shallow breathing spells not frank apnea. REM sleep was not seen. The patient slept in supine position for part of the night and his AHI went over 36.3. He was a loud snorer during the night in the sleep lab. He did not retain carbon dioxide, he had 49 minutes of desaturation time, he was titrated to only 6 cm water pressure was alleviated part of his apnea. I have ordered an auto titrated for him to see if he can further help hypoxemia. Insomnia was also clearly present but it was not caused by an organic sleep problem it was not the apnea causing insomnia. I will defer this treatment to Dr. Toy Care. His compliance report dated 09/03/2015 shows 100% compliance for days of use and 97% compliance for  over 4 hours of nightly use currently he uses the machine on average 11 hours and 24 minutes daily. His AHI is 1.5 at a pressure of 14 cm water with 2 cm EPR. This is an excellent result and he does not need to adjust that he has some air leaks partially due to facial hair. He likes a full face mask.   Review of Systems: Out of a complete 14 system review, the patient complains of only the following symptoms, and all other reviewed systems are negative.  Epworth score 10  , Fatigue severity score  58 from 49  , depression score 14/15- indicative of major depression.    Social History   Social History  . Marital Status: Single    Spouse Name: N/A  . Number of Children: N/A  . Years of Education: N/A   Occupational History  . Not on file.   Social History Main Topics  . Smoking status: Former Smoker -- 0.10 packs/day for 10 years    Types: Cigars, Cigarettes    Quit date: 08/09/2014  . Smokeless tobacco: Never Used  . Alcohol Use: No  . Drug Use: No  . Sexual Activity:    Partners: Male     Comment: pt. declined condoms   Other Topics Concern  . Not on file   Social History Narrative   Epworth Sleepiness Scale = 7 (as of 03/16/2015)    Family History  Problem Relation Age of Onset  . Cancer Brother   . Depression Brother   . COPD Mother     Past Medical History  Diagnosis Date  . Diabetes mellitus without complication ([place])   . ADHD (attention deficit hyperactivity disorder)   . Depression   . GERD (gastroesophageal reflux disease)   . HIV infection ([place])   . Ulcer   . Anxiety   . Clotting disorder ([place])   . Dizziness 03/17/2015  . OSA (obstructive sleep apnea) 07/25/2015    Uses CPAP regularly  . Diabetes mellitus, type II ([place])   . Chronic kidney disease   . Liver disease     Past Surgical History  Procedure Laterality Date  . Small intestine surgery    . Stomach surgery      Current Outpatient Prescriptions  Medication Sig Dispense Refill  .  abacavir-dolutegravir-lamiVUDine (TRIUMEQ) 600-50-300 MG tablet Take 1 tablet by mouth daily. 90 tablet 1  . ALPRAZolam (XANAX) 1 MG tablet Take 1 mg by mouth 3 (three) times daily as needed for anxiety.     [name] amphetamine-dextroamphetamine (ADDERALL) 30 MG tablet Take 30 mg by mouth 3 (three) times daily.     [name] atorvastatin (LIPITOR) 10 MG tablet  TAKE ONE TABLET BY MOUTH ONCE DAILY. 90 tablet 11  . Blood Glucose Monitoring Suppl (BLOOD GLUCOSE METER KIT AND SUPPLIES) Dispense based on patient and insurance preference. Use up to four times daily as directed. (FOR ICD-9 250.00, 250.01). 1 each 0  . divalproex (DEPAKOTE ER) 500 MG 24 hr tablet Take 1 tablet (500 mg total) by mouth at bedtime. 30 tablet 11  . glucose blood test strip Test blood sugar daily. Dx E11.9 50 each 11  . Insulin Glargine (LANTUS SOLOSTAR) 100 UNIT/ML Solostar Pen INJECT 30 UNITS SUBCUTANEOUSLY AT BEDTIME. (Patient taking differently: 40 Units. INJECT 30 UNITS SUBCUTANEOUSLY AT BEDTIME.) 15 mL 0  . Insulin Pen Needle (PEN NEEDLES 3/16") 31G X 5 MM MISC Use to inject Lantus daily. 100 each 3  . levothyroxine (SYNTHROID, LEVOTHROID) 50 MCG tablet Take 1 tablet (50 mcg total) by mouth at bedtime. 30 tablet 11  . oxyCODONE (OXY IR/ROXICODONE) 5 MG immediate release tablet Take 1 tablet (5 mg total) by mouth 2 (two) times daily. 45 tablet 0  . pregabalin (LYRICA) 75 MG capsule TAKE ONE CAPSULE BY MOUTH THREE DAILY. 90 capsule 3  . prochlorperazine (COMPAZINE) 10 MG tablet TAKE ONE TABLET BY MOUTH EVERY 6 HOURS AS NEEDED. 120 tablet 0  . ranitidine (ZANTAC) 150 MG tablet Take 150 mg by mouth daily.    . rivaroxaban (XARELTO) 20 MG TABS tablet Take 1 tablet (20 mg total) by mouth daily. 30 tablet 5  . TRINTELLIX 20 MG TABS Take 20 mg by mouth at bedtime.    [name] UNABLE TO FIND CPAP MACHINE with standard [employer]claim nasal mask with humidifier. Set at 14 cwp 1 each 0  . VOLTAREN 1 % GEL APPLY 2 GRAMS TO EACH KNEE IN THE MORNING AND AT BEDTIME  AND APPLY 1 GRAM TO EACH KNEE IN THE AFTERNOON. 300 g 5  . zolpidem (AMBIEN) 10 MG tablet Take 10-20 mg by mouth at bedtime as needed for sleep.      No current facility-administered medications for this visit.    Allergies as of 09/05/2015 - Review Complete 09/05/2015  Allergen Reaction Noted  . Aspirin Swelling 06/28/2015  . Ibuprofen Swelling 06/28/2015  . Sustiva [efavirenz] Rash and Swelling 06/04/2006  . Acetaminophen  06/28/2015  . Nsaids Other (See Comments) 05/21/2015    Vitals: BP 138/72 mmHg  Pulse 86  Resp 20  Ht 5' 10.5" (1.791 m)  Wt 234 lb (106.142 kg)  BMI 33.09 kg/m2 Last Weight:  Wt Readings from Last 1 Encounters:  09/05/15 234 lb (106.142 kg)   NWG:NFAO mass index is 33.09 kg/(m^2).     Last Height:   Ht Readings from Last 1 Encounters:  09/05/15 5' 10.5" (1.791 m)    Physical exam:  General: The patient is awake, alert and appears not in acute distress. The patient is not  Groomed. He has full facial hair.  Head: Normocephalic, atraumatic. Neck is supple. Mallampati 4,  neck circumference: 18.5 . Nasal airflow  restricted , TMJ is evident . Retrognathia is not seen.  Cardiovascular:  Regular rate and rhythm , without  murmurs or carotid bruit, and without distended neck veins. Respiratory: Lungs are clear to auscultation. Skin:  Without evidence of edema, or rash Trunk: BMI is elevated . The patient's posture is not erect   Neurologic exam : The patient is awake and alert, oriented to place and time.   Memory subjective described as intact.  Attention span & concentration ability appears normal.  Speech is  fluent,  without dysarthria, dysphonia or aphasia.  Mood and affect are appropriate.  Cranial nerves: Pupils are equal and briskly reactive to light. Funduscopic exam without evidence of pallor or edema.  Extraocular movements  in vertical and horizontal planes intact and without nystagmus. Visual fields by finger perimetry are intact. Hearing  to finger rub intact. Facial sensation intact to fine touch. Facial motor strength is symmetric and tongue and uvula move midline. Shoulder shrug was symmetrical.  Motor exam:  Normal tone, muscle bulk and symmetric strength in all extremities. Sensory:  Fine touch, pinprick and vibration were tested in all extremities. Proprioception tested in the upper extremities was normal. Coordination: Rapid alternating movements / Finger-to-nose maneuver normal without evidence of ataxia, dysmetria or tremor. Gait and station: not evaluated. Deep tendon reflexes: in the  upper and lower extremities are symmetric and intact. Babinski maneuver response is downgoing.  The patient was advised of the nature of the diagnosed sleep disorder , the treatment options and risks for general a health and wellness arising from not treating the condition.  I spent more than 25 minutes of face to face time with the patient. Greater than 50% of time was spent in counseling and coordination of care. We have discussed the diagnosis and differential and I answered the patient's questions.     Assessment:  After physical and neurologic examination, review of laboratory studies,  Personal review of imaging studies, reports of other /same  Imaging studies ,  Results of polysomnography/ neurophysiology testing and pre-existing records as far as provided in visit., my assessment is   1) Daniel [name] is an established patient of Dr. Toy Care and has seen [name] in 2014. He has a history of diabetes mellitus chronic depression HIV infection blood clotting disorder foot drop and ADHD. He is followed for chronic renal insufficiency by a nephrologist- his kidney disease has been stable.  He is currently taking Xanax; codeine, Adderall, Lipitor he checks his blood sugars at home he takes Synthroid 75 g, Antivert when necessary was given in February a prescription for oxycodone, has Compazine, ranitidine, Xarelto, 20 Lex, Ambien. His review of  system is positive for frequency of urination, heat intolerance, light sensitivity, shortness of breath and coughing, he had recently a DVT which caused his leg to swell. Has a history of apnea treated with a CPAP machine that is by now very old, and has recurrent daytime sleepiness issues.  2) Depression,  Insomnia, ciracadian free flow rhythm- Dr Toy Care.  Daniel [name] admits to have a nonexisting sleep hygiene routines or rules. It may help him to establish such routines. I would give him a little booklet about insomnia treatment in general but also circadian rhythm disorders.  3) Daniel [name] carries a diagnosis of sleep apnea and his sleep study confirmed this.  AHi 20, uses CPAP at 14 cm water.  new machine with FFM, 97% compliance,  Hypoxemia was noted in his sleep study, resolved with CPAP , 09-05-2015.    Plan:  Treatment plan and additional workup : I am not too optimistic that Daniel.  [name] will learn to establish routines and sleep hygiene after this has aparently been preached to him for 14 years.  He has a history of Apnea without any documentation in Epic. Now motivated to use CPAP, he sleeps 11 hours daily,  Depression !!!! Daniel [name] will be followed for sleep, not general neurology- for any general neurology he will need to see dr [name].  Asencion Partridge Kandyce Dieguez MD  09/05/2015   CC: [name], [address] [place], [place] [address]

## 2015-09-06 ENCOUNTER — Ambulatory Visit
Admission: RE | Admit: 2015-09-06 | Discharge: 2015-09-06 | Disposition: A | Discharge status: Home / Self Care | Payer: BLUE CROSS/BLUE SHIELD | Source: Ambulatory Visit | Attending: Emergency Medicine | Admitting: Emergency Medicine

## 2015-09-06 ENCOUNTER — Ambulatory Visit (INDEPENDENT_AMBULATORY_CARE_PROVIDER_SITE_OTHER): Payer: BLUE CROSS/BLUE SHIELD | Admitting: Emergency Medicine

## 2015-09-06 ENCOUNTER — Other Ambulatory Visit: Payer: BLUE CROSS/BLUE SHIELD

## 2015-09-06 ENCOUNTER — Ambulatory Visit (INDEPENDENT_AMBULATORY_CARE_PROVIDER_SITE_OTHER): Payer: BLUE CROSS/BLUE SHIELD

## 2015-09-06 ENCOUNTER — Telehealth: Payer: Self-pay | Admitting: Emergency Medicine

## 2015-09-06 ENCOUNTER — Encounter: Payer: Self-pay | Admitting: Emergency Medicine

## 2015-09-06 VITALS — BP 120/74 | HR 77 | Temp 98.1°F | Resp 16 | Ht 70.5 in | Wt 239.0 lb

## 2015-09-06 DIAGNOSIS — S91109A Unspecified open wound of unspecified toe(s) without damage to nail, initial encounter: Secondary | ICD-10-CM

## 2015-09-06 DIAGNOSIS — T148XXA Other injury of unspecified body region, initial encounter: Secondary | ICD-10-CM

## 2015-09-06 DIAGNOSIS — S20212A Contusion of left front wall of thorax, initial encounter: Secondary | ICD-10-CM

## 2015-09-06 DIAGNOSIS — M7989 Other specified soft tissue disorders: Secondary | ICD-10-CM

## 2015-09-06 DIAGNOSIS — E0849 Diabetes mellitus due to underlying condition with other diabetic neurological complication: Secondary | ICD-10-CM

## 2015-09-06 LAB — COMPLETE METABOLIC PANEL WITH GFR
ALT: 14 U/L (ref 9–46)
AST: 12 U/L (ref 10–35)
Albumin: 4 g/dL (ref 3.6–5.1)
Alkaline Phosphatase: 89 U/L (ref 40–115)
BUN: 19 mg/dL (ref 7–25)
CHLORIDE: 106 mmol/L (ref 98–110)
CO2: 27 mmol/L (ref 20–31)
CREATININE: 1.66 mg/dL — AB (ref 0.70–1.25)
Calcium: 8.9 mg/dL (ref 8.6–10.3)
GFR, Est African American: 50 mL/min — ABNORMAL LOW (ref >=60)
GFR, Est Non African American: 44 mL/min — ABNORMAL LOW (ref >=60)
Glucose, Bld: 173 mg/dL — ABNORMAL HIGH (ref 65–99)
Potassium: 4.1 mmol/L (ref 3.5–5.3)
SODIUM: 141 mmol/L (ref 135–146)
Total Bilirubin: 0.8 mg/dL (ref 0.2–1.2)
Total Protein: 6.6 g/dL (ref 6.1–8.1)

## 2015-09-06 LAB — POCT CBC
Granulocyte percent: 50.2 %G (ref 37–80)
HEMATOCRIT: 44.2 % (ref 43.5–53.7)
Hemoglobin: 15.9 g/dL (ref 14.1–18.1)
LYMPH, POC: 2.5 (ref 0.6–3.4)
MCH, POC: 33.8 pg — AB (ref 27–31.2)
MCHC: 36 g/dL — AB (ref 31.8–35.4)
MCV: 93.8 fL (ref 80–97)
MID (cbc): 0.8 (ref 0–0.9)
MPV: 6.4 fL (ref 0–99.8)
POC GRANULOCYTE: 3.3 (ref 2–6.9)
POC LYMPH %: 38.2 % (ref 10–50)
POC MID %: 11.6 %M (ref 0–12)
Platelet Count, POC: 135 10*3/uL — AB (ref 142–424)
RBC: 4.71 M/uL (ref 4.69–6.13)
RDW, POC: 14 %
WBC: 6.6 10*3/uL (ref 4.6–10.2)

## 2015-09-06 LAB — POCT GLYCOSYLATED HEMOGLOBIN (HGB A1C): HEMOGLOBIN A1C: 7

## 2015-09-06 LAB — GLUCOSE, POCT (MANUAL RESULT ENTRY): POC GLUCOSE: 177 mg/dL — AB (ref 70–99)

## 2015-09-06 MED ORDER — OXYCODONE HCL 5 MG PO TABS: ORAL_TABLET | ORAL | Status: DC

## 2015-09-06 MED ORDER — CEPHALEXIN 500 MG PO CAPS: 500.0000 mg | ORAL_CAPSULE | Freq: Four times a day (QID) | ORAL | Status: DC

## 2015-09-06 NOTE — Progress Notes (Addendum)
Patient ID: Daniel [name], male   DOB: 05-30-1955, 60 y.o.   MRN: [id number]    By signing my name below, I, [name], attest that this documentation has been prepared under the direction and in the presence of [name], MD Electronically Signed: Ladene Artist, ED Scribe 09/06/2015 at 9:52 AM.  Chief Complaint:  Chief Complaint  Patient presents with  . Fall    fell on Sunday, Hematoma on left side and cut right great toe    HPI: Daniel [name] is a 60 y.o. male who reports to [facility] today complaining of a mechanical fall that occurred 4 days ago. Pt states that he was in a hurry and slipped on his pants legs while he was putting them on. Pt denies head injury or LOC. He states that he struck his left ribs on an unknown object during the fall and has bruising to the area. He reports increased pain to the area that is exacerbated with palpation. No treatments tried PTA.   R Leg Swelling Pt also presents with right leg swelling since his DVT. He does not wear support stockings. He states that he has a deep cut on his right great toe that occasionally drains pus. He was last seen by podiatry 2 days ago; his next appointment is in 10 days. Pt states that his podiatrist was supposed to call in an antibiotic to his pharmacy but his pharmacy has not received it.   Depression  Pt states that his depression has been terrible and he has difficulty falling asleep. States he does not sleep until 2-3 AM and wants to sleep for the majority of the day. He followed up with [facility] to discuss to possibility of shock therapy. He was informed that they would do it but will not because his medications conflict with it. His next appointment with his psychiatrist in 2-3 months.   Past Medical History  Diagnosis Date  . Diabetes mellitus without complication ([place])   . ADHD (attention deficit hyperactivity disorder)   . Depression   . GERD (gastroesophageal reflux disease)   . HIV infection ([place])   . Ulcer    . Anxiety   . Clotting disorder ([place])   . Dizziness 03/17/2015  . OSA (obstructive sleep apnea) 07/25/2015    Uses CPAP regularly  . Diabetes mellitus, type II ([place])   . Chronic kidney disease   . Liver disease    Past Surgical History  Procedure Laterality Date  . Small intestine surgery    . Stomach surgery     Social History   Social History  . Marital Status: Single    Spouse Name: N/A  . Number of Children: N/A  . Years of Education: N/A   Social History Main Topics  . Smoking status: Former Smoker -- 0.10 packs/day for 10 years    Types: Cigars, Cigarettes    Quit date: 08/09/2014  . Smokeless tobacco: Never Used  . Alcohol Use: No  . Drug Use: No  . Sexual Activity:    Partners: Male     Comment: pt. declined condoms   Other Topics Concern  . Not on file   Social History Narrative   Epworth Sleepiness Scale = 7 (as of 03/16/2015)   Family History  Problem Relation Age of Onset  . Cancer Brother   . Depression Brother   . COPD Mother    Allergies  Allergen Reactions  . Aspirin Swelling  . Ibuprofen Swelling  . Sustiva [Efavirenz]  Rash and Swelling    And rash.  . Acetaminophen     States he was told by Nephrologist not to use Tylenol due to CKD.    . Nsaids Other (See Comments)    unknwn   Prior to Admission medications   Medication Sig Start Date End Date Taking? Authorizing Provider  abacavir-dolutegravir-lamiVUDine (TRIUMEQ) 600-50-300 MG tablet Take 1 tablet by mouth daily. 09/03/15   Thayer Headings, MD  ALPRAZolam Duanne Moron) 1 MG tablet Take 1 mg by mouth 3 (three) times daily as needed for anxiety.     Historical Provider, MD  amphetamine-dextroamphetamine (ADDERALL) 30 MG tablet Take 30 mg by mouth 3 (three) times daily.     Historical Provider, MD  atorvastatin (LIPITOR) 10 MG tablet TAKE ONE TABLET BY MOUTH ONCE DAILY. 01/12/15   Tishira R Brewington, PA-C  Blood Glucose Monitoring Suppl (BLOOD GLUCOSE METER KIT AND SUPPLIES) Dispense based on  patient and insurance preference. Use up to four times daily as directed. (FOR ICD-9 250.00, 250.01). 09/08/13   [name], MD  divalproex (DEPAKOTE ER) 500 MG 24 hr tablet Take 1 tablet (500 mg total) by mouth at bedtime. 07/17/15   Marcial Pacas, MD  glucose blood test strip Test blood sugar daily. Dx E11.9 01/10/15   Tishira R Brewington, PA-C  Insulin Glargine (LANTUS SOLOSTAR) 100 UNIT/ML Solostar Pen INJECT 30 UNITS SUBCUTANEOUSLY AT BEDTIME. Patient taking differently: 40 Units. INJECT 30 UNITS SUBCUTANEOUSLY AT BEDTIME. 08/22/15   [name], MD  Insulin Pen Needle (PEN NEEDLES 3/16") 31G X 5 MM MISC Use to inject Lantus daily. 03/30/15   [name], MD  levothyroxine (SYNTHROID, LEVOTHROID) 50 MCG tablet Take 1 tablet (50 mcg total) by mouth at bedtime. 07/24/15   [name], MD  oxyCODONE (OXY IR/ROXICODONE) 5 MG immediate release tablet Take 1 tablet (5 mg total) by mouth 2 (two) times daily. 06/01/15   [name], MD  pregabalin (LYRICA) 75 MG capsule TAKE ONE CAPSULE BY MOUTH THREE DAILY. 07/25/15   [name], MD  prochlorperazine (COMPAZINE) 10 MG tablet TAKE ONE TABLET BY MOUTH EVERY 6 HOURS AS NEEDED. 11/23/14   Thayer Headings, MD  ranitidine (ZANTAC) 150 MG tablet Take 150 mg by mouth daily.    Historical Provider, MD  rivaroxaban (XARELTO) 20 MG TABS tablet Take 1 tablet (20 mg total) by mouth daily. 03/13/15   [name], MD  TRINTELLIX 20 MG TABS Take 20 mg by mouth at bedtime. 01/26/15   Historical Provider, MD  UNABLE TO FIND CPAP MACHINE with standard Aclaim nasal mask with humidifier. Set at 14 cwp 04/12/13   [name], MD  VOLTAREN 1 % GEL APPLY 2 GRAMS TO EACH KNEE IN THE MORNING AND AT BEDTIME AND APPLY 1 GRAM TO EACH KNEE IN THE AFTERNOON. 02/23/15   [name], MD  zolpidem (AMBIEN) 10 MG tablet Take 10-20 mg by mouth at bedtime as needed for sleep.     Historical Provider, MD   ROS: The patient denies fevers, chills, night sweats, unintentional weight loss,  chest pain, palpitations, wheezing, dyspnea on exertion, nausea, vomiting, abdominal pain, dysuria, hematuria, melena, numbness, weakness, or tingling.   All other systems have been reviewed and were otherwise negative with the exception of those mentioned in the HPI and as above.    PHYSICAL EXAM: Filed Vitals:   09/06/15 0856  BP: 120/74  Pulse: 77  Temp: 98.1 F (36.7 C)  Resp: 16  Body mass index is 33.8 kg/(m^2).  General: Alert, no acute distress. Depressed appearing but cooperative  HEENT:  Normocephalic, atraumatic, oropharynx patent. Eye: Juliette Mangle [facility] Cardiovascular:  Regular rate and rhythm, no rubs murmurs or gallops. No Carotid bruits, radial pulse intact. No pedal edema.  Respiratory: Clear to auscultation bilaterally. No wheezes, rales, or rhonchi. No cyanosis, no use of accessory musculature Abdominal: Large hematoma in the LUQ with significant ecchymosis  Musculoskeletal: Gait intact. 2+ swelling of the R leg with pain in the R calf. Tenderness over the L lateral ribs Skin: No rashes. 3x3 cm infected callus medial to the R great toe  Neurologic: Facial musculature symmetric. Psychiatric: Patient acts appropriately throughout our interaction. Lymphatic: No cervical or submandibular lymphadenopathy  LABS: Results for orders placed or performed in visit on 09/06/15  POCT CBC  Result Value Ref Range   WBC 6.6 4.6 - 10.2 K/uL   Lymph, poc 2.5 0.6 - 3.4   POC LYMPH PERCENT 38.2 10 - 50 %L   MID (cbc) 0.8 0 - 0.9   POC MID % 11.6 0 - 12 %M   POC Granulocyte 3.3 2 - 6.9   Granulocyte percent 50.2 37 - 80 %G   RBC 4.71 4.69 - 6.13 M/uL   Hemoglobin 15.9 14.1 - 18.1 g/dL   HCT, POC 44.2 43.5 - 53.7 %   MCV 93.8 80 - 97 fL   MCH, POC 33.8 (A) 27 - 31.2 pg   MCHC 36.0 (A) 31.8 - 35.4 g/dL   RDW, POC 14.0 %   Platelet Count, POC 135 (A) 142 - 424 K/uL   MPV 6.4 0 - 99.8 fL  POCT glucose (manual entry)  Result Value Ref Range   POC Glucose 177 (A) 70 - 99 mg/dl    POCT glycosylated hemoglobin (Hb A1C)  Result Value Ref Range   Hemoglobin A1C 7.0    EKG/XRAY:   Primary read interpreted by Dr. Everlene Farrier at [facility]. Dg Ribs Unilateral W/chest Left  09/06/2015  CLINICAL DATA:  Status post fall 4 days ago, chest contusion. EXAM: LEFT RIBS AND CHEST - 3+ VIEW COMPARISON:  Thoracic spine series of April 24, 2015 and chest x-ray of February 21, 2015 FINDINGS: The lungs are adequately inflated. The interstitial markings are coarse but stable. There is no pneumothorax or pleural effusion. The heart is top-normal in size. The mediastinum is normal in width. Left rib detail images centered over the lower left ribcage reveal no acute displaced fractures. IMPRESSION: No acute left rib fracture is observed. The area marked is in the region of costal cartilages. No acute cardiopulmonary abnormality. Electronically Signed   By: David  Martinique M.D.   On: 09/06/2015 09:34  Ct Abdomen Wo Contrast  09/06/2015  CLINICAL DATA:  Recent fall with left-sided abdominal pain EXAM: CT ABDOMEN WITHOUT CONTRAST TECHNIQUE: Multidetector CT imaging of the abdomen was performed following the standard protocol without IV contrast. COMPARISON:  None. FINDINGS: Lower chest: Minimal left basilar atelectasis is noted. No pneumothorax is seen. Hepatobiliary: No mass visualized on this un-enhanced exam. Pancreas: No mass or inflammatory process identified on this un-enhanced exam. Spleen: Within normal limits in size. Adrenals/Urinary Tract: No evidence of urolithiasis or hydronephrosis. No definite mass visualized on this un-enhanced exam. Stomach/Bowel: No evidence of obstruction, inflammatory process, or abnormal fluid collections. The appendix is within normal limits. Vascular/Lymphatic: No pathologically enlarged lymph nodes. No evidence of abdominal aortic aneurysm. Other: None. Musculoskeletal:  No suspicious bone lesions identified. IMPRESSION: Minimal left basilar atelectasis.  No other focal  abnormality is seen. Electronically Signed   By: Inez Catalina M.D.   On: 09/06/2015 12:25   Dg Ribs Unilateral W/chest Left  09/06/2015  CLINICAL DATA:  Status post fall 4 days ago, chest contusion. EXAM: LEFT RIBS AND CHEST - 3+ VIEW COMPARISON:  Thoracic spine series of April 24, 2015 and chest x-ray of February 21, 2015 FINDINGS: The lungs are adequately inflated. The interstitial markings are coarse but stable. There is no pneumothorax or pleural effusion. The heart is top-normal in size. The mediastinum is normal in width. Left rib detail images centered over the lower left ribcage reveal no acute displaced fractures. IMPRESSION: No acute left rib fracture is observed. The area marked is in the region of costal cartilages. No acute cardiopulmonary abnormality. Electronically Signed   By: David  Martinique M.D.   On: 09/06/2015 09:34   ASSESSMENT/PLAN: Hemoglobin A1c down to 7 which is good for him. No change in diabetic regimen. We'll CT his left upper abdomen to be sure he has not had a spleen injury. He does have mild thrombocytopenia but hemoglobin was normal. He is on Xarelto. I initially was going to give him tramadol for pain but apparently there is a serotonin reaction with his HIV drugs. After reconsideration of feel the safest would be of to give him oxycodone 5 mg to take a half a tablet every 8 hours and gave him #16 tablets.I personally performed the services described in this documentation, which was scribed in my presence. The recorded information has been reviewed and is accurate.  Will send patient over for a CT abdomen oral contrast only to evaluate the spleen and lower left ribs. CT showed minimal left basilar atelectasis consistent with his injury.  Gross sideeffects, risk and benefits, and alternatives of medications d/w patient. Patient is aware that all medications have potential sideeffects and we are unable to predict every sideeffect or drug-drug interaction that may  occur.  Arlyss Queen MD 09/06/2015 8:55 AM

## 2015-09-06 NOTE — Patient Instructions (Addendum)
     IF you received an x-ray today, you will receive an invoice from [facility]. Please contact [facility] at [phone] with questions or concerns regarding your invoice.   IF you received labwork today, you will receive an invoice from [employer]. Please contact [facility] at [phone] with questions or concerns regarding your invoice.   Our billing staff will not be able to assist you with questions regarding bills from these companies.  You will be contacted with the lab results as soon as they are available. The fastest way to get your results is to activate your My Chart account. Instructions are located on the last page of this paperwork. If you have not heard from [name] regarding the results in 2 weeks, please contact this office.     Go to [facility] at [address]

## 2015-09-07 DIAGNOSIS — B351 Tinea unguium: Secondary | ICD-10-CM

## 2015-09-08 ENCOUNTER — Other Ambulatory Visit: Payer: Self-pay | Admitting: Emergency Medicine

## 2015-09-08 DIAGNOSIS — M7989 Other specified soft tissue disorders: Secondary | ICD-10-CM

## 2015-09-10 ENCOUNTER — Telehealth: Payer: Self-pay

## 2015-09-10 NOTE — Telephone Encounter (Signed)
Patient wants to know Dr. Perfecto Kingdom opinion about getting a virtual colonoscopy done by [facility]. Please advise! [phone]

## 2015-09-11 ENCOUNTER — Ambulatory Visit (HOSPITAL_COMMUNITY)
Admission: RE | Admit: 2015-09-11 | Discharge: 2015-09-11 | Disposition: A | Discharge status: Home / Self Care | Payer: BLUE CROSS/BLUE SHIELD | Source: Ambulatory Visit | Attending: Emergency Medicine | Admitting: Emergency Medicine

## 2015-09-11 ENCOUNTER — Ambulatory Visit (INDEPENDENT_AMBULATORY_CARE_PROVIDER_SITE_OTHER): Payer: BLUE CROSS/BLUE SHIELD | Admitting: Cardiovascular Disease

## 2015-09-11 ENCOUNTER — Encounter: Payer: Self-pay | Admitting: Cardiovascular Disease

## 2015-09-11 VITALS — BP 137/80 | HR 100 | Ht 70.5 in | Wt 244.0 lb

## 2015-09-11 DIAGNOSIS — E785 Hyperlipidemia, unspecified: Secondary | ICD-10-CM

## 2015-09-11 DIAGNOSIS — F329 Major depressive disorder, single episode, unspecified: Secondary | ICD-10-CM | POA: Insufficient documentation

## 2015-09-11 DIAGNOSIS — K219 Gastro-esophageal reflux disease without esophagitis: Secondary | ICD-10-CM | POA: Insufficient documentation

## 2015-09-11 DIAGNOSIS — N189 Chronic kidney disease, unspecified: Secondary | ICD-10-CM | POA: Diagnosis not present

## 2015-09-11 DIAGNOSIS — F488 Other specified nonpsychotic mental disorders: Secondary | ICD-10-CM | POA: Diagnosis not present

## 2015-09-11 DIAGNOSIS — F419 Anxiety disorder, unspecified: Secondary | ICD-10-CM | POA: Insufficient documentation

## 2015-09-11 DIAGNOSIS — B20 Human immunodeficiency virus [HIV] disease: Secondary | ICD-10-CM | POA: Diagnosis not present

## 2015-09-11 DIAGNOSIS — E1122 Type 2 diabetes mellitus with diabetic chronic kidney disease: Secondary | ICD-10-CM | POA: Diagnosis not present

## 2015-09-11 DIAGNOSIS — M7989 Other specified soft tissue disorders: Secondary | ICD-10-CM

## 2015-09-11 DIAGNOSIS — G4733 Obstructive sleep apnea (adult) (pediatric): Secondary | ICD-10-CM | POA: Insufficient documentation

## 2015-09-11 DIAGNOSIS — R0602 Shortness of breath: Secondary | ICD-10-CM | POA: Diagnosis not present

## 2015-09-11 NOTE — Patient Instructions (Addendum)
Medication Instructions:  Your physician recommends that you continue on your current medications as directed. Please refer to the Current Medication list given to you today.  Labwork: none  Testing/Procedures: none  Follow-Up: AS NEEDED

## 2015-09-11 NOTE — Progress Notes (Signed)
Cardiology Office Note   Date:  09/11/2015   ID:  Daniel [name], Daniel [name] 03-13-1955, MRN [id number]  PCP:  Jenny Reichmann, MD  Cardiologist:   Skeet Latch, MD   Chief Complaint  Patient presents with  . Follow-up    pt states no chest pain, had a fall on sunday and since then he is having a hard time breathing due to the pain    . Edema    lower right foot and leg states he had an doppler done 09/11/2015    Daniel [name] is a 60 y.o. male with HIV, DV/PE (11/2014), OSA on CPAP, and diabetes who presents for follow up on dizziness.   History of Present Illness:  Daniel [name] was fist evaluated 02/2015 for dizziness and falls.  The symptoms had been ongoing for several months.  He felt as though the room was spinning when changing position, leaning his head back or laying down.  He was referred for an echo 03/2015 that revealed LVEF 60-65% with mild LVH.  He also had carotid Dopplers 02/2015 that revealed minimal plaque bilaterally.  Additionally, he reported exertional dyspnea so he was referred for [facility] which was negative for ischemia. In the interim Daniel [name] also had nerve conduction studies that revealed peripheral neuropathy consistent with diabetic neuropathy. After his last appointment he was referred for a 30 day event monitor.  No arrhythmias are noted. He pressed the button indicating that he had an episode of syncope, though on interviewing him today he reports that he actually has not had any episodes of syncope while wearing the monitor. His main complaints today center around orthopedic issues. He has back and leg pain chronically that he does not feel are treated adequately. He continues to endorse chronic right lower extremity edema ever since his DVT. He denies orthopnea or PND. He is not exercising and has no intentions of doing so.  He also reports that his diet is still poor. He has not had any recurrent episodes of syncope.   Past Medical History  Diagnosis  Date  . Diabetes mellitus without complication ([place])   . ADHD (attention deficit hyperactivity disorder)   . Depression   . GERD (gastroesophageal reflux disease)   . HIV infection ([place])   . Ulcer   . Anxiety   . Clotting disorder ([place])   . Dizziness 03/17/2015  . OSA (obstructive sleep apnea) 07/25/2015    Uses CPAP regularly  . Diabetes mellitus, type II ([place])   . Chronic kidney disease   . Liver disease     Past Surgical History  Procedure Laterality Date  . Small intestine surgery    . Stomach surgery      Current Outpatient Prescriptions  Medication Sig Dispense Refill  . abacavir-dolutegravir-lamiVUDine (TRIUMEQ) 600-50-300 MG tablet Take 1 tablet by mouth daily. 90 tablet 1  . ALPRAZolam (XANAX) 1 MG tablet Take 1 mg by mouth 3 (three) times daily as needed for anxiety.     [name] amphetamine-dextroamphetamine (ADDERALL) 30 MG tablet Take 30 mg by mouth 3 (three) times daily.     [name] atorvastatin (LIPITOR) 10 MG tablet TAKE ONE TABLET BY MOUTH ONCE DAILY. 90 tablet 11  . Blood Glucose Monitoring Suppl (BLOOD GLUCOSE METER KIT AND SUPPLIES) Dispense based on patient and insurance preference. Use up to four times daily as directed. (FOR ICD-9 250.00, 250.01). 1 each 0  . cephALEXin (KEFLEX) 500 MG capsule Take 1 capsule (500 mg total) by mouth  4 (four) times daily. 20 capsule 0  . divalproex (DEPAKOTE ER) 500 MG 24 hr tablet Take 1 tablet (500 mg total) by mouth at bedtime. 30 tablet 11  . glucose blood test strip Test blood sugar daily. Dx E11.9 50 each 11  . insulin glargine (LANTUS) 100 UNIT/ML injection Inject 40 Units into the skin at bedtime.    . Insulin Pen Needle (PEN NEEDLES 3/16") 31G X 5 MM MISC Use to inject Lantus daily. 100 each 3  . levothyroxine (SYNTHROID, LEVOTHROID) 50 MCG tablet Take 1 tablet (50 mcg total) by mouth at bedtime. 30 tablet 11  . oxyCODONE (OXY IR/ROXICODONE) 5 MG immediate release tablet Take one half tablet every 8 hours. 16 tablet 0  . pregabalin  (LYRICA) 75 MG capsule TAKE ONE CAPSULE BY MOUTH THREE DAILY. 90 capsule 3  . prochlorperazine (COMPAZINE) 10 MG tablet TAKE ONE TABLET BY MOUTH EVERY 6 HOURS AS NEEDED. 120 tablet 0  . ranitidine (ZANTAC) 150 MG tablet Take 150 mg by mouth daily.    . rivaroxaban (XARELTO) 20 MG TABS tablet Take 1 tablet (20 mg total) by mouth daily. 30 tablet 5  . TRINTELLIX 20 MG TABS Take 20 mg by mouth at bedtime.    [name] UNABLE TO FIND CPAP MACHINE with standard Aclaim nasal mask with humidifier. Set at 14 cwp 1 each 0  . VOLTAREN 1 % GEL APPLY 2 GRAMS TO EACH KNEE IN THE MORNING AND AT BEDTIME AND APPLY 1 GRAM TO EACH KNEE IN THE AFTERNOON. 300 g 5  . zolpidem (AMBIEN) 10 MG tablet Take 10-20 mg by mouth at bedtime as needed for sleep.      No current facility-administered medications for this visit.    Allergies:   Aspirin; Ibuprofen; Sustiva; Acetaminophen; and Nsaids    Social History:  The patient  reports that he quit smoking about 13 months ago. His smoking use included Cigars and Cigarettes. He has a 1 pack-year smoking history. He has never used smokeless tobacco. He reports that he does not drink alcohol or use illicit drugs.   Family History:  The patient's family history includes COPD in his mother; Cancer in his brother; Depression in his brother.    ROS:  Please see the history of present illness.  Otherwise, review of systems are positive for chronic R LE edema.   All other systems are reviewed and negative.    PHYSICAL EXAM: VS:  BP 137/80 mmHg  Pulse 100  Ht 5' 10.5" (1.791 m)  Wt 244 lb (110.678 kg)  BMI 34.50 kg/m2 , BMI Body mass index is 34.5 kg/(m^2).  Repeat BP 136/84 GENERAL:  Well appearing HEENT:  Pupils equal round and reactive, fundi not visualized, oral mucosa unremarkable NECK:  No jugular venous distention, waveform within normal limits, carotid upstroke brisk and symmetric, no bruits. LYMPHATICS:  No cervical adenopathy LUNGS:  Clear to auscultation  bilaterally HEART:  RRR.  PMI not displaced or sustained,S1 and S2 within normal limits, no S3, no S4, no clicks, no rubs, no murmurs ABD:  Flat, positive bowel sounds normal in frequency in pitch, no bruits, no rebound, no guarding, no midline pulsatile mass, no hepatomegaly, no splenomegaly EXT:  2 plus pulses throughout, no edema, no cyanosis no clubbing SKIN:  No rashes no nodules NEURO:  Cranial nerves II through XII grossly intact, motor grossly intact throughout PSYCH:  Cognitively intact, oriented to person place and time  EKG:  EKG is no ordered today. The ekg ordered  03/16/15 demonstrates sinus rhythm.  Rate 90 bpm.   Carotid Doppler 03/23/15: IMPRESSION: 1. Trace smooth heterogeneous plaque in the distal left common carotid artery. 2. No evidence of internal carotid plaque or stenosis bilaterally. 3. Vertebral arteries remain patent with normal antegrade flow.  Echo 04/05/15: Study Conclusions  - Left ventricle: The cavity size was normal. Wall thickness was  increased in a pattern of mild LVH. Systolic function was normal.  The estimated ejection fraction was in the range of 60% to 65%.  Biplane speckle tracking LVEF was not accurately measured and  therefore not reported. Wall motion was normal; there were no  regional wall motion abnormalities. Left ventricular diastolic  function parameters were normal for the patient&'s age. - Aortic valve: Mildly calcified annulus. Trileaflet. - Mitral valve: Calcified annulus. There was trivial regurgitation. - Right atrium: Central venous pressure (est): 3 mm Hg. - Tricuspid valve: There was physiologic regurgitation. - Pulmonary arteries: Systolic pressure could not be accurately  estimated. - Pericardium, extracardiac: There was no pericardial effusion.  30 day Event Monitor 07/30/15:  Quality: Fair. Baseline artifact.  Sinus rhythm and sinus tachycardia noted during an episode of syncope.  Lexiscan Cardiolite  06/29/15:  There was no ST segment deviation noted during stress.  The study is normal.  This is a low risk study.  This study was not gated.  Recent Labs: 07/11/2015: TSH 0.19* 08/07/2015: Platelets 123* 09/06/2015: ALT 14; BUN 19; Creat 1.66*; Hemoglobin 15.9; Potassium 4.1; Sodium 141    Lipid Panel    Component Value Date/Time   CHOL 154 08/09/2015 0956   TRIG 108 08/09/2015 0956   HDL 47 08/09/2015 0956   CHOLHDL 3.3 08/09/2015 0956   VLDL 22 08/09/2015 0956   LDLCALC 85 08/09/2015 0956      Wt Readings from Last 3 Encounters:  09/11/15 244 lb (110.678 kg)  09/06/15 239 lb (108.41 kg)  09/05/15 234 lb (106.142 kg)      ASSESSMENT AND PLAN:  # Syncope: Carotid ultrasound did not show obstructive disease.  Echo and stress test were unremarkable.  He wore a 30 day event monitor that reported an episode of syncope and no arrhythmias were noted.  However, today he notes that he actually did not have any syncope while wearing the monitor.  His symptoms do not sound cardiac.  The fact that he has transient paralysis for 6-8 hours is not consistent with a cardiac etiology.    # Hyperlipidemia: Continue atorvastatin. LDL 85.  # DVT: Recurrent, unprovoked DVT. Lifelong Xarelto.  # SOB: Stress and echo were unremarkable. Symptoms are likely due to deconditioning and obesity.  I encouraged him to start exercising, though he reports that he will not due to pain.    Current medicines are reviewed at length with the patient today.  The patient does not have concerns regarding medicines.  The following changes have been made:  no change  Labs/ tests ordered today include:   No orders of the defined types were placed in this encounter.     Disposition:   FU with Joseph Bias C. [name], MD, [facility] as needed.   This note was written with the assistance of speech recognition soft theware.  Please excuse any transcriptional errors.  Signed, Aniyia Rane C. [name], MD, [facility]   09/11/2015 5:50 PM    [facility]

## 2015-09-11 NOTE — Telephone Encounter (Signed)
Standard colonoscopy is still the best in order to pick up small polyps. Virtual colonoscopy is an option and would be okay. Cologuard  is also an option and picks out about 75% of all: Cancers but not polyps.

## 2015-09-11 NOTE — Telephone Encounter (Signed)
Advised pt. I advised pt he had a negative venous doppler. He wants to know why his leg is still swollen?

## 2015-09-12 ENCOUNTER — Ambulatory Visit (INDEPENDENT_AMBULATORY_CARE_PROVIDER_SITE_OTHER): Payer: BLUE CROSS/BLUE SHIELD | Admitting: Emergency Medicine

## 2015-09-12 ENCOUNTER — Ambulatory Visit (INDEPENDENT_AMBULATORY_CARE_PROVIDER_SITE_OTHER): Payer: BLUE CROSS/BLUE SHIELD | Admitting: Podiatry

## 2015-09-12 ENCOUNTER — Ambulatory Visit (INDEPENDENT_AMBULATORY_CARE_PROVIDER_SITE_OTHER): Payer: BLUE CROSS/BLUE SHIELD

## 2015-09-12 ENCOUNTER — Encounter: Payer: Self-pay | Admitting: Podiatry

## 2015-09-12 VITALS — BP 140/79 | HR 86 | Temp 98.0°F | Resp 16

## 2015-09-12 VITALS — BP 128/78 | HR 94 | Temp 98.4°F | Resp 17 | Ht 70.5 in | Wt 240.0 lb

## 2015-09-12 DIAGNOSIS — E1149 Type 2 diabetes mellitus with other diabetic neurological complication: Secondary | ICD-10-CM

## 2015-09-12 DIAGNOSIS — E1140 Type 2 diabetes mellitus with diabetic neuropathy, unspecified: Secondary | ICD-10-CM

## 2015-09-12 DIAGNOSIS — L97519 Non-pressure chronic ulcer of other part of right foot with unspecified severity: Secondary | ICD-10-CM

## 2015-09-12 DIAGNOSIS — E1340 Other specified diabetes mellitus with diabetic neuropathy, unspecified: Secondary | ICD-10-CM | POA: Diagnosis not present

## 2015-09-12 DIAGNOSIS — L03031 Cellulitis of right toe: Secondary | ICD-10-CM | POA: Diagnosis not present

## 2015-09-12 DIAGNOSIS — L02611 Cutaneous abscess of right foot: Secondary | ICD-10-CM

## 2015-09-12 DIAGNOSIS — L89891 Pressure ulcer of other site, stage 1: Secondary | ICD-10-CM | POA: Diagnosis not present

## 2015-09-12 DIAGNOSIS — R739 Hyperglycemia, unspecified: Secondary | ICD-10-CM

## 2015-09-12 LAB — POCT CBC
GRANULOCYTE PERCENT: 58.2 % (ref 37–80)
HCT, POC: 44.1 % (ref 43.5–53.7)
Hemoglobin: 15.8 g/dL (ref 14.1–18.1)
LYMPH, POC: 2 (ref 0.6–3.4)
MCH, POC: 33.5 pg — AB (ref 27–31.2)
MCHC: 35.9 g/dL — AB (ref 31.8–35.4)
MCV: 93.2 fL (ref 80–97)
MID (CBC): 0.6 (ref 0–0.9)
MPV: 6.4 fL (ref 0–99.8)
PLATELET COUNT, POC: 133 10*3/uL — AB (ref 142–424)
POC GRANULOCYTE: 3.6 (ref 2–6.9)
POC LYMPH %: 32.5 % (ref 10–50)
POC MID %: 9.3 %M (ref 0–12)
RBC: 4.73 M/uL (ref 4.69–6.13)
RDW, POC: 13.8 %
WBC: 6.2 10*3/uL (ref 4.6–10.2)

## 2015-09-12 LAB — GLUCOSE, POCT (MANUAL RESULT ENTRY): POC Glucose: 414 mg/dl — AB (ref 70–99)

## 2015-09-12 MED ORDER — SILVER SULFADIAZINE 1 % EX CREA: 1.0000 "application " | TOPICAL_CREAM | Freq: Every day | CUTANEOUS | Status: DC

## 2015-09-12 MED ORDER — CIPROFLOXACIN HCL 500 MG PO TABS: 500.0000 mg | ORAL_TABLET | Freq: Two times a day (BID) | ORAL | Status: DC

## 2015-09-12 MED ORDER — DOXYCYCLINE HYCLATE 100 MG PO TABS: 100.0000 mg | ORAL_TABLET | Freq: Two times a day (BID) | ORAL | Status: DC

## 2015-09-12 NOTE — Progress Notes (Addendum)
By signing my name below, I, [name], attest that this documentation has been prepared under the direction and in the presence of Arlyss Queen, MD.  Electronically Signed: [name], Medical Scribe. 09/12/2015. 11:35 AM.  Chief Complaint:  Chief Complaint  Patient presents with  . Toe Injury    right side     HPI: Daniel [name] is a 60 y.o. male with a PMHx of DM who reports to [facility] today complaining of right big toe injury a week ago. Pt noticed it when he was walking in his kitchen barefoot and he notice spots on blood on the floor. Pt states it started out with a crack underneath his callus due to dry skim, and it has progressed up the side of his toe. Pt reports the skin on the bottom of his toe is now white and the crack is getting deeper. Pt is taking Keflex for his symptoms. Pt has been cleaning it with alcohol and neosporin. Pt states he couldn't feel the sting of the alcohol- pt has neuropathy in his feet. Pt went to his podiatrist, Dr. Prudence Davidson 09/04/15- Dr. Prudence Davidson only works Tuesday, and was offered to get another doctor but opted for Dr. Everlene Farrier. Pt's states his podiatrist did a debridement, but he didn't get a x-ray of his foot.   Past Medical History  Diagnosis Date  . Diabetes mellitus without complication ([place])   . ADHD (attention deficit hyperactivity disorder)   . Depression   . GERD (gastroesophageal reflux disease)   . HIV infection ([place])   . Ulcer   . Anxiety   . Clotting disorder ([place])   . Dizziness 03/17/2015  . OSA (obstructive sleep apnea) 07/25/2015    Uses CPAP regularly  . Diabetes mellitus, type II ([place])   . Chronic kidney disease   . Liver disease    Past Surgical History  Procedure Laterality Date  . Small intestine surgery    . Stomach surgery     Social History   Social History  . Marital Status: Single    Spouse Name: N/A  . Number of Children: N/A  . Years of Education: N/A   Social History Main Topics  . Smoking status: Former  Smoker -- 0.10 packs/day for 10 years    Types: Cigars, Cigarettes    Quit date: 08/09/2014  . Smokeless tobacco: Never Used  . Alcohol Use: No  . Drug Use: No  . Sexual Activity:    Partners: Male     Comment: pt. declined condoms   Other Topics Concern  . None   Social History Narrative   Epworth Sleepiness Scale = 7 (as of 03/16/2015)   Family History  Problem Relation Age of Onset  . Cancer Brother   . Depression Brother   . COPD Mother    Allergies  Allergen Reactions  . Aspirin Swelling  . Ibuprofen Swelling  . Sustiva [Efavirenz] Rash and Swelling    And rash.  . Acetaminophen     States he was told by Nephrologist not to use Tylenol due to CKD.    . Nsaids Other (See Comments)    unknwn   Prior to Admission medications   Medication Sig Start Date End Date Taking? Authorizing Provider  abacavir-dolutegravir-lamiVUDine (TRIUMEQ) 600-50-300 MG tablet Take 1 tablet by mouth daily. 09/03/15  Yes Thayer Headings, MD  ALPRAZolam Duanne Moron) 1 MG tablet Take 1 mg by mouth 3 (three) times daily as needed for anxiety.    Yes Historical Provider,  MD  amphetamine-dextroamphetamine (ADDERALL) 30 MG tablet Take 30 mg by mouth 3 (three) times daily.    Yes Historical Provider, MD  atorvastatin (LIPITOR) 10 MG tablet TAKE ONE TABLET BY MOUTH ONCE DAILY. 01/12/15  Yes Tishira R Brewington, PA-C  Blood Glucose Monitoring Suppl (BLOOD GLUCOSE METER KIT AND SUPPLIES) Dispense based on patient and insurance preference. Use up to four times daily as directed. (FOR ICD-9 250.00, 250.01). 09/08/13  Yes [name], MD  cephALEXin (KEFLEX) 500 MG capsule Take 1 capsule (500 mg total) by mouth 4 (four) times daily. 09/06/15  Yes [name], MD  divalproex (DEPAKOTE ER) 500 MG 24 hr tablet Take 1 tablet (500 mg total) by mouth at bedtime. 07/17/15  Yes Marcial Pacas, MD  glucose blood test strip Test blood sugar daily. Dx E11.9 01/10/15  Yes Tishira R Brewington, PA-C  insulin glargine (LANTUS) 100  UNIT/ML injection Inject 40 Units into the skin at bedtime.   Yes Historical Provider, MD  Insulin Pen Needle (PEN NEEDLES 3/16") 31G X 5 MM MISC Use to inject Lantus daily. 03/30/15  Yes [name], MD  levothyroxine (SYNTHROID, LEVOTHROID) 50 MCG tablet Take 1 tablet (50 mcg total) by mouth at bedtime. 07/24/15  Yes [name], MD  oxyCODONE (OXY IR/ROXICODONE) 5 MG immediate release tablet Take one half tablet every 8 hours. 09/06/15  Yes [name], MD  pregabalin (LYRICA) 75 MG capsule TAKE ONE CAPSULE BY MOUTH THREE DAILY. 07/25/15  Yes [name], MD  prochlorperazine (COMPAZINE) 10 MG tablet TAKE ONE TABLET BY MOUTH EVERY 6 HOURS AS NEEDED. 11/23/14  Yes Thayer Headings, MD  ranitidine (ZANTAC) 150 MG tablet Take 150 mg by mouth daily.   Yes Historical Provider, MD  rivaroxaban (XARELTO) 20 MG TABS tablet Take 1 tablet (20 mg total) by mouth daily. 03/13/15  Yes [name], MD  TRINTELLIX 20 MG TABS Take 20 mg by mouth at bedtime. 01/26/15  Yes Historical Provider, MD  UNABLE TO FIND CPAP MACHINE with standard [employer] nasal mask with humidifier. Set at 14 cwp 04/12/13  Yes [name], MD  VOLTAREN 1 % GEL APPLY 2 GRAMS TO EACH KNEE IN THE MORNING AND AT BEDTIME AND APPLY 1 GRAM TO EACH KNEE IN THE AFTERNOON. 02/23/15  Yes [name], MD  zolpidem (AMBIEN) 10 MG tablet Take 10-20 mg by mouth at bedtime as needed for sleep.    Yes Historical Provider, MD     ROS: The patient denies fevers, chills, night sweats, unintentional weight loss, chest pain, palpitations, wheezing, dyspnea on exertion, nausea, vomiting, abdominal pain, dysuria, hematuria, melena, weakness, or tingling. + numbness  All other systems have been reviewed and were otherwise negative with the exception of those mentioned in the HPI and as above.    PHYSICAL EXAM: Filed Vitals:   09/12/15 1132  BP: 128/78  Pulse: 94  Temp: 98.4 F (36.9 C)  Resp: 17   Body mass index is 33.94 kg/(m^2).  General: Alert,  no acute distress HEENT:  Normocephalic, atraumatic, oropharynx patent. Eye: [facility] Cardiovascular:  Regular rate and rhythm, no rubs murmurs or gallops.  No Carotid bruits, radial pulse intact. No pedal edema.  Respiratory: Clear to auscultation bilaterally.  No wheezes, rales, or rhonchi.  No cyanosis, no use of accessory musculature Abdominal: No organomegaly, abdomen is soft and non-tender, positive bowel sounds.  No masses. Musculoskeletal: Gait intact. No edema, tenderness Skin: No rashes. Neurologic: Facial musculature symmetric.  Psychiatric: Patient acts appropriately throughout our interaction. Lymphatic: No cervical or submandibular lymphadenopathy  LABS: Results for orders placed or performed in visit on 09/12/15  POCT CBC  Result Value Ref Range   WBC 6.2 4.6 - 10.2 K/uL   Lymph, poc 2.0 0.6 - 3.4   POC LYMPH PERCENT 32.5 10 - 50 %L   MID (cbc) 0.6 0 - 0.9   POC MID % 9.3 0 - 12 %M   POC Granulocyte 3.6 2 - 6.9   Granulocyte percent 58.2 37 - 80 %G   RBC 4.73 4.69 - 6.13 M/uL   Hemoglobin 15.8 14.1 - 18.1 g/dL   HCT, POC 44.1 43.5 - 53.7 %   MCV 93.2 80 - 97 fL   MCH, POC 33.5 (A) 27 - 31.2 pg   MCHC 35.9 (A) 31.8 - 35.4 g/dL   RDW, POC 13.8 %   Platelet Count, POC 133 (A) 142 - 424 K/uL   MPV 6.4 0 - 99.8 fL  POCT glucose (manual entry)  Result Value Ref Range   POC Glucose 414 (A) 70 - 99 mg/dl   EKG/XRAY:   Primary read interpreted by Dr. [facility]lene Farrier at [facility]. Ct Abdomen Wo Contrast  09/06/2015  CLINICAL DATA:  Recent fall with left-sided abdominal pain EXAM: CT ABDOMEN WITHOUT CONTRAST TECHNIQUE: Multidetector CT imaging of the abdomen was performed following the standard protocol without IV contrast. COMPARISON:  None. FINDINGS: Lower chest: Minimal left basilar atelectasis is noted. No pneumothorax is seen. Hepatobiliary: No mass visualized on this un-enhanced exam. Pancreas: No mass or inflammatory process identified on this un-enhanced exam. Spleen: Within  normal limits in size. Adrenals/Urinary Tract: No evidence of urolithiasis or hydronephrosis. No definite mass visualized on this un-enhanced exam. Stomach/Bowel: No evidence of obstruction, inflammatory process, or abnormal fluid collections. The appendix is within normal limits. Vascular/Lymphatic: No pathologically enlarged lymph nodes. No evidence of abdominal aortic aneurysm. Other: None. Musculoskeletal:  No suspicious bone lesions identified. IMPRESSION: Minimal left basilar atelectasis. No other focal abnormality is seen. Electronically Signed   By: Inez Catalina M.D.   On: 09/06/2015 12:25   Dg Ribs Unilateral W/chest Left  09/06/2015  CLINICAL DATA:  Status post fall 4 days ago, chest contusion. EXAM: LEFT RIBS AND CHEST - 3+ VIEW COMPARISON:  Thoracic spine series of April 24, 2015 and chest x-ray of February 21, 2015 FINDINGS: The lungs are adequately inflated. The interstitial markings are coarse but stable. There is no pneumothorax or pleural effusion. The heart is top-normal in size. The mediastinum is normal in width. Left rib detail images centered over the lower left ribcage reveal no acute displaced fractures. IMPRESSION: No acute left rib fracture is observed. The area marked is in the region of costal cartilages. No acute cardiopulmonary abnormality. Electronically Signed   By: David  Martinique M.D.   On: 09/06/2015 09:34   Dg Toe Great Right  09/12/2015  CLINICAL DATA:  Right great toe injury 1 week ago, neuropathic ulce;History of diabetes, chronic renal insufficiency, HIV. EXAM: RIGHT GREAT TOE COMPARISON:  Right foot series of May 24, 2015 FINDINGS: The bones of the great toe are subjectively adequately mineralized. There is no lytic or blastic lesion or erosive change. The IP and MTP joints are reasonably well-maintained. The soft tissues of the great toe appear mildly swollen with some disruption of the surface of the soft tissues over the medial aspect of the distal phalanx.  IMPRESSION: Cutaneous wound over the medial aspect of the distal phalanx. No  soft tissue gas. No underlying bony changes to suggest osteomyelitis. Electronically Signed   By: David  Martinique M.D.   On: 09/12/2015 12:21   ASSESSMENT/PLAN: Patient is very noncompliant with his diabetes. His sugar today is 414. He has a necrotic neuropathic ulcer involving the toe. He will follow-up with the podiatrist today film did not show osteo. I did send a culture. The area has the smell of necrotic skin.I personally performed the services described in this documentation, which was scribed in my presence. The recorded information has been reviewed and is accurate.   Gross sideeffects, risk and benefits, and alternatives of medications d/w patient. Patient is aware that all medications have potential sideeffects and we are unable to predict every sideeffect or drug-drug interaction that may occur.  Arlyss Queen MD 09/12/2015 11:35 AM

## 2015-09-12 NOTE — Telephone Encounter (Signed)
Spoke with Daniel [name], states he does not recall conversation with tamara. Also states he was in clinic today and all issues have been resolved.

## 2015-09-12 NOTE — Progress Notes (Signed)
Subjective:     Patient ID: Daniel [name], male   DOB: 1956-02-11, 60 y.o.   MRN: [id number]  HPI patient presents and poor health with a open area on the right hallux that had been a fissure last week but is worsened over the last week and presents on referral from urgent care today. They did do a culture results will not be present for a number of days and they did clean the area off but did not treat it actively and they also took x-ray   Review of Systems     Objective:   Physical Exam Significant diminishment of neurological including DTR reflexes sharp dull and vibratory. Patient is noted on his right hallux to have an approximate 1.5 cm x 1.5 cm open lesion at the medial portion of the plantar hallux that is localized in nature with no deep exposure upon probing. There is proximal erythema from this area extending several centimeters but no indications of cellulitic event and patient's temperature was found to be 98.0. Patient did have a high sugar today but he stated that he ate a sugar bowl of cereal prior to testing it and he states that generally his sugar runs around 150 and his last A1c was 7.2    Assessment:     At risk diabetic with ulceration of the right big toe localized in nature with extension of several centimeters in a proximal direction    Plan:     Reviewed x-rays indicating no sign currently of osteomyelitis and went ahead placed him in a wedge shoe to take pressure off the area and placed him on doxycycline 100 mg twice a day and also Cipro 500 mg twice a day. I did clean the area up flushed out the area did not note any deep involvement currently and I applied Iodosorb to the area with sterile dressing. He will begin Silvadene usage at home soaks and will be seen back for [name] to recheck him again in 5 days by Dr. Prudence Davidson. He was given strict instructions if he should develop any proximal redness or any systemic signs of infection to go straight to the emergency room and  also I did explain to him that there is a chance that he is going to lose his big toe because of this event

## 2015-09-12 NOTE — Patient Instructions (Signed)
     IF you received an x-ray today, you will receive an invoice from [facility]. Please contact [facility] at [phone] with questions or concerns regarding your invoice.   IF you received labwork today, you will receive an invoice from [facility]. Please contact [facility] at [phone] with questions or concerns regarding your invoice.   Our billing staff will not be able to assist you with questions regarding bills from these companies.  You will be contacted with the lab results as soon as they are available. The fastest way to get your results is to activate your My Chart account. Instructions are located on the last page of this paperwork. If you have not heard from us regarding the results in 2 weeks, please contact this office.      

## 2015-09-12 NOTE — Telephone Encounter (Signed)
Once the patient has a clot in the vein the vein is damaged. The vein does not work correctly in the leg tends to swell. He would benefit from wearing support stockings.

## 2015-09-13 ENCOUNTER — Other Ambulatory Visit: Payer: Self-pay | Admitting: Emergency Medicine

## 2015-09-13 DIAGNOSIS — I82409 Acute embolism and thrombosis of unspecified deep veins of unspecified lower extremity: Secondary | ICD-10-CM

## 2015-09-13 MED ORDER — RIVAROXABAN 15 MG PO TABS: 15.0000 mg | ORAL_TABLET | Freq: Every day | ORAL | Status: DC

## 2015-09-13 NOTE — Telephone Encounter (Signed)
Call patient. I decreased the dose of his Xarelto because of his decreased renal function

## 2015-09-14 LAB — WOUND CULTURE: Gram Stain: NONE SEEN

## 2015-09-18 ENCOUNTER — Ambulatory Visit (INDEPENDENT_AMBULATORY_CARE_PROVIDER_SITE_OTHER): Payer: BLUE CROSS/BLUE SHIELD | Admitting: Sports Medicine

## 2015-09-18 VITALS — BP 123/76 | HR 92 | Resp 14

## 2015-09-18 DIAGNOSIS — L03031 Cellulitis of right toe: Secondary | ICD-10-CM | POA: Diagnosis not present

## 2015-09-18 DIAGNOSIS — E1149 Type 2 diabetes mellitus with other diabetic neurological complication: Secondary | ICD-10-CM

## 2015-09-18 DIAGNOSIS — L02611 Cutaneous abscess of right foot: Secondary | ICD-10-CM

## 2015-09-18 DIAGNOSIS — E1140 Type 2 diabetes mellitus with diabetic neuropathy, unspecified: Secondary | ICD-10-CM | POA: Diagnosis not present

## 2015-09-18 DIAGNOSIS — R0989 Other specified symptoms and signs involving the circulatory and respiratory systems: Secondary | ICD-10-CM

## 2015-09-18 DIAGNOSIS — L97511 Non-pressure chronic ulcer of other part of right foot limited to breakdown of skin: Secondary | ICD-10-CM | POA: Diagnosis not present

## 2015-09-19 ENCOUNTER — Encounter (HOSPITAL_COMMUNITY): Payer: BLUE CROSS/BLUE SHIELD

## 2015-09-19 ENCOUNTER — Telehealth: Payer: Self-pay | Admitting: *Deleted

## 2015-09-19 DIAGNOSIS — L97519 Non-pressure chronic ulcer of other part of right foot with unspecified severity: Secondary | ICD-10-CM | POA: Insufficient documentation

## 2015-09-19 DIAGNOSIS — R0989 Other specified symptoms and signs involving the circulatory and respiratory systems: Secondary | ICD-10-CM

## 2015-09-19 NOTE — Telephone Encounter (Signed)
I'm not understanding what you need help with. I'm not sure how to access his My Chart? -[name]

## 2015-09-19 NOTE — Telephone Encounter (Addendum)
-----   Message from Landis Martins, [place] sent at 09/18/2015  5:32 PM EDT ----- Regarding: ABI/PVRs Right toe ulceration diabetic neuropathy decreased pulses with history of DVT Thanks Dr [name]. Pt states he had circulation test 09/11/2015 at [facility], he can see it on MY CHART.  I told pt I would inform [name] and see if she still wanted the arterial dopplers.  Pt states if she does he would like to change to a [place] facility. 09/20/2015-I spoke with [place] and she said pt's doppler 09/11/2015 were performed by [facility] ordered by Dr. Everlene Farrier.  I told pt our office was not able to see those results, and pt states he will keep his appt with [facility].

## 2015-09-19 NOTE — Progress Notes (Signed)
Subjective: Daniel [name] is a 60 y.o. male patient seen in office for evaluation of ulceration of the right hallux; reports has been open about 10 days started out as a callus that worsened has also been seen by other associates here with in [place] and also seen by primary care doctor who has treated the ulceration. Patient currently on doxycycline and ciprofloxacin. Patient has a history of diabetes and a blood glucose level this week of 440 went to primary care doctor who has adjusted insulin.  Patient is changing the dressing using Silvadene at home . Reports that in between the first and second toe on the right foot. There seems like a new ulceration present. Denies nausea/fever/vomiting/chills/night sweats/shortness of breath/pain. Patient has no other pedal complaints at this time.  Patient Active Problem List   Diagnosis Date Noted  . Toe ulcer, right ([place]) 09/19/2015  . Decreased pedal pulses 09/19/2015  . OSA on CPAP 09/05/2015  . Major depressive disorder, recurrent episode, moderate ([place]) 09/05/2015  . OSA (obstructive sleep apnea) 07/25/2015  . Passed out 06/28/2015  . Low back pain 06/11/2015  . Abnormality of gait 06/11/2015  . Dizziness 03/17/2015  . Weakness 02/21/2015  . Chronic renal insufficiency, stage III (moderate) 08/09/2014  . Diabetes ([place]) 11/07/2013  . Hematuria 06/21/2013  . Hepatic steatosis 09/09/2010  . UTI 09/15/2006  . PYURIA 09/03/2006  . Human immunodeficiency virus (HIV) disease ([place]) 06/04/2006  . HERPES ZOSTER, UNCOMPLICATED 03/54/6568  . HSV 06/04/2006  . Depression 06/04/2006  . DISORDER, ATTENTION DEFICIT W/HYPERACTIVITY 06/04/2006  . THROMBOPHLEBITIS NOS 06/04/2006  . GERD 06/04/2006  . ARTHRITIS, HAND 06/04/2006  . HYPERGLYCEMIA, HX OF 06/04/2006  . HEPATITIS B, HX OF 06/04/2006   Current Outpatient Prescriptions on File Prior to Visit  Medication Sig Dispense Refill  . abacavir-dolutegravir-lamiVUDine (TRIUMEQ) 600-50-300 MG  tablet Take 1 tablet by mouth daily. 90 tablet 1  . ALPRAZolam (XANAX) 1 MG tablet Take 1 mg by mouth 3 (three) times daily as needed for anxiety.     [name] amphetamine-dextroamphetamine (ADDERALL) 30 MG tablet Take 30 mg by mouth 3 (three) times daily.     [name] atorvastatin (LIPITOR) 10 MG tablet TAKE ONE TABLET BY MOUTH ONCE DAILY. 90 tablet 11  . Blood Glucose Monitoring Suppl (BLOOD GLUCOSE METER KIT AND SUPPLIES) Dispense based on patient and insurance preference. Use up to four times daily as directed. (FOR ICD-9 250.00, 250.01). 1 each 0  . cephALEXin (KEFLEX) 500 MG capsule Take 1 capsule (500 mg total) by mouth 4 (four) times daily. 20 capsule 0  . ciprofloxacin (CIPRO) 500 MG tablet Take 1 tablet (500 mg total) by mouth 2 (two) times daily. 20 tablet 0  . divalproex (DEPAKOTE ER) 500 MG 24 hr tablet Take 1 tablet (500 mg total) by mouth at bedtime. 30 tablet 11  . doxycycline (VIBRA-TABS) 100 MG tablet Take 1 tablet (100 mg total) by mouth 2 (two) times daily. 20 tablet 0  . glucose blood test strip Test blood sugar daily. Dx E11.9 50 each 11  . insulin glargine (LANTUS) 100 UNIT/ML injection Inject 40 Units into the skin at bedtime.    . Insulin Pen Needle (PEN NEEDLES 3/16") 31G X 5 MM MISC Use to inject Lantus daily. 100 each 3  . levothyroxine (SYNTHROID, LEVOTHROID) 50 MCG tablet Take 1 tablet (50 mcg total) by mouth at bedtime. 30 tablet 11  . oxyCODONE (OXY IR/ROXICODONE) 5 MG immediate release tablet Take one half tablet every 8 hours.  16 tablet 0  . pregabalin (LYRICA) 75 MG capsule TAKE ONE CAPSULE BY MOUTH THREE DAILY. 90 capsule 3  . prochlorperazine (COMPAZINE) 10 MG tablet TAKE ONE TABLET BY MOUTH EVERY 6 HOURS AS NEEDED. 120 tablet 0  . ranitidine (ZANTAC) 150 MG tablet Take 150 mg by mouth daily.    . rivaroxaban (XARELTO) 15 MG TABS tablet Take 1 tablet (15 mg total) by mouth daily. 30 tablet 11  . silver sulfADIAZINE (SILVADENE) 1 % cream Apply 1 application topically daily. 50  g 1  . TRINTELLIX 20 MG TABS Take 20 mg by mouth at bedtime.    [name] UNABLE TO FIND CPAP MACHINE with standard [employer] nasal mask with humidifier. Set at 14 cwp 1 each 0  . VOLTAREN 1 % GEL APPLY 2 GRAMS TO EACH KNEE IN THE MORNING AND AT BEDTIME AND APPLY 1 GRAM TO EACH KNEE IN THE AFTERNOON. 300 g 5  . zolpidem (AMBIEN) 10 MG tablet Take 10-20 mg by mouth at bedtime as needed for sleep.      No current facility-administered medications on file prior to visit.    Allergies  Allergen Reactions  . Aspirin Swelling  . Ibuprofen Swelling  . Sustiva [Efavirenz] Rash and Swelling    And rash.  . Acetaminophen     States he was told by Nephrologist not to use Tylenol due to CKD.    . Nsaids Other (See Comments)    unknwn    Recent Results (from the past 2160 hour(s))  Hemoglobin A1c     Status: Abnormal   Collection Time: 06/28/15  8:48 AM  Result Value Ref Range   Hgb A1c MFr Bld 7.2 (H) 4.8 - 5.6 %    Comment:          Pre-diabetes: 5.7 - 6.4          Diabetes: >6.4          Glycemic control for adults with diabetes: <7.0    Est. average glucose Bld gHb Est-mCnc 160 mg/dL  Myocardial Perfusion Imaging     Status: None   Collection Time: 06/29/15  4:02 PM  Result Value Ref Range   Rest HR 69 bpm   Rest BP 109/72 mmHg   Exercise duration (min)  min   Exercise duration (sec)  sec   Estimated workload  METS   Peak HR 92 bpm   Peak BP 119/85 mmHg   MPHR  bpm   Percent HR  %   RPE     LV sys vol  mL   TID 1.14    LV dias vol  62 - 150 mL   LHR     SSS 0    SRS 0    SDS 0   CK     Status: None   Collection Time: 07/11/15  1:48 PM  Result Value Ref Range   Total CK 19 7 - 232 U/L  Sedimentation rate     Status: None   Collection Time: 07/11/15  1:48 PM  Result Value Ref Range   Sed Rate 11 0 - 20 mm/hr  Erythropoietin     Status: None   Collection Time: 07/11/15  1:48 PM  Result Value Ref Range   Erythropoietin 15.8 2.6 - 18.5 mIU/mL  TSH     Status: Abnormal    Collection Time: 07/11/15  1:48 PM  Result Value Ref Range   TSH 0.19 (L) 0.40 - 4.50 mIU/L  POCT CBC  Status: Abnormal   Collection Time: 07/11/15  1:59 PM  Result Value Ref Range   WBC 6.5 4.6 - 10.2 K/uL   Lymph, poc 2.7 0.6 - 3.4   POC LYMPH PERCENT 41.6 10 - 50 %L   MID (cbc) 0.5 0 - 0.9   POC MID % 7.4 0 - 12 %M   POC Granulocyte 3.3 2 - 6.9   Granulocyte percent 51.0 37 - 80 %G   RBC 4.45 (A) 4.69 - 6.13 M/uL   Hemoglobin 15.3 14.1 - 18.1 g/dL   HCT, POC 43.3 (A) 43.5 - 53.7 %   MCV 97.2 (A) 80 - 97 fL   MCH, POC 34.3 (A) 27 - 31.2 pg   MCHC 35.2 31.8 - 35.4 g/dL   RDW, POC 14.4 %   Platelet Count, POC 131 (A) 142 - 424 K/uL   MPV 5.9 0 - 99.8 fL  POCT glucose (manual entry)     Status: Abnormal   Collection Time: 07/11/15  1:59 PM  Result Value Ref Range   POC Glucose 149 (A) 70 - 99 mg/dl  POCT urinalysis dipstick     Status: Abnormal   Collection Time: 07/11/15  2:13 PM  Result Value Ref Range   Color, UA yellow yellow   Clarity, UA clear clear   Glucose, UA =250 (A) negative   Bilirubin, UA negative negative   Ketones, POC UA negative negative   Spec Grav, UA 1.025    Blood, UA small (A) negative   pH, UA 5.5    Protein Ur, POC =100 (A) negative   Urobilinogen, UA 1.0    Nitrite, UA Negative Negative   Leukocytes, UA Negative Negative  POCT Microscopic Urinalysis ([facility])     Status: Abnormal   Collection Time: 07/11/15  2:20 PM  Result Value Ref Range   WBC,UR,HPF,POC None None WBC/hpf   RBC,UR,HPF,POC None None RBC/hpf   Bacteria None None, Too numerous to count   Mucus Absent Absent   Epithelial Cells, UR Per Microscopy Few (A) None, Too numerous to count cells/hpf  PSA     Status: None   Collection Time: 07/11/15  2:21 PM  Result Value Ref Range   PSA 1.14 <=4.00 ng/mL    Comment: Test Methodology: ECLIA PSA (Electrochemiluminescence Immunoassay)   For PSA values from 2.5-4.0, particularly in younger men <71 years old, the AUA and NCCN suggest  testing for % Free PSA (3515) and evaluation of the rate of increase in PSA (PSA velocity).   Urine cytology ancillary only     Status: None   Collection Time: 08/07/15 12:00 AM  Result Value Ref Range   Chlamydia Negative     Comment: Normal Reference Range - Negative   Neisseria gonorrhea Negative     Comment: Normal Reference Range - Negative  T-helper cell (CD4)- ([facility] clinic only)     Status: None   Collection Time: 08/07/15  2:00 PM  Result Value Ref Range   CD4 T Cell Abs 680 400 - 2,700 /uL   CD4 % Helper T Cell 33 33 - 55 %    Comment: Performed at [facility]  HIV 1 RNA quant-no reflex-bld     Status: None   Collection Time: 08/07/15  4:23 PM  Result Value Ref Range   HIV 1 RNA Quant <20 <20 copies/mL    Comment:  NOT DETECTED   HIV1 RNA Quant, Log <1.30 <1.30 Log copies/mL    Comment:   This test was performed using the COBAS AmpliPrep/COBAS TaqMan HIV-1 test kit version 2.0. ([place].)   Comp Met (CMET)     Status: Abnormal   Collection Time: 08/07/15  4:23 PM  Result Value Ref Range   Sodium 138 135 - 146 mmol/L   Potassium 4.3 3.5 - 5.3 mmol/L   Chloride 103 98 - 110 mmol/L   CO2 25 20 - 31 mmol/L   Glucose, Bld 140 (H) 65 - 99 mg/dL   BUN 16 7 - 25 mg/dL   Creat 1.52 (H) 0.70 - 1.25 mg/dL    Comment:   For patients > or = 62 years of age: The upper reference limit for Creatinine is approximately 13% higher for people identified as African-American.      Total Bilirubin 0.9 0.2 - 1.2 mg/dL   Alkaline Phosphatase 94 40 - 115 U/L   AST 15 10 - 35 U/L   ALT 18 9 - 46 U/L   Total Protein 6.4 6.1 - 8.1 g/dL   Albumin 3.9 3.6 - 5.1 g/dL   Calcium 8.8 8.6 - 10.3 mg/dL  CBC with Differential/Platelet     Status: Abnormal   Collection Time: 08/07/15  4:23 PM  Result Value Ref Range   WBC 7.2 3.8 - 10.8 K/uL   RBC 4.82 4.20 - 5.80 MIL/uL   Hemoglobin 16.1 13.2 - 17.1 g/dL   HCT 45.7 38.5 -  50.0 %   MCV 94.8 80.0 - 100.0 fL   MCH 33.4 (H) 27.0 - 33.0 pg   MCHC 35.2 32.0 - 36.0 g/dL   RDW 14.6 11.0 - 15.0 %   Platelets 123 (L) 140 - 400 K/uL   MPV 9.1 7.5 - 12.5 fL   Neutro Abs 4,320 1,500 - 7,800 cells/uL   Lymphs Abs 2,016 850 - 3,900 cells/uL   Monocytes Absolute 792 200 - 950 cells/uL   Eosinophils Absolute 72 15 - 500 cells/uL   Basophils Absolute 0 0 - 200 cells/uL   Neutrophils Relative % 60 %   Lymphocytes Relative 28 %   Monocytes Relative 11 %   Eosinophils Relative 1 %   Basophils Relative 0 %   Smear Review Criteria for review not met     Comment: ** Please note change in unit of measure and reference range(s). **  RPR     Status: None   Collection Time: 08/07/15  4:23 PM  Result Value Ref Range   RPR Ser Ql NON REAC NON REAC  Lipid panel     Status: None   Collection Time: 08/09/15  9:56 AM  Result Value Ref Range   Cholesterol 154 125 - 200 mg/dL   Triglycerides 108 <150 mg/dL   HDL 47 >=40 mg/dL   Total CHOL/HDL Ratio 3.3 <=5.0 Ratio   VLDL 22 <30 mg/dL   LDL Cholesterol 85 <130 mg/dL    Comment:   Total Cholesterol/HDL Ratio:CHD Risk                        Coronary Heart Disease Risk Table                                        Men       Women  1/2 Average Risk              3.4        3.3              Average Risk              5.0        4.4           2X Average Risk              9.6        7.1           3X Average Risk             23.4       11.0 Use the calculated Patient Ratio above and the CHD Risk table  to determine the patient's CHD Risk.   COMPLETE METABOLIC PANEL WITH GFR     Status: Abnormal   Collection Time: 09/06/15  9:23 AM  Result Value Ref Range   Sodium 141 135 - 146 mmol/L   Potassium 4.1 3.5 - 5.3 mmol/L   Chloride 106 98 - 110 mmol/L   CO2 27 20 - 31 mmol/L   Glucose, Bld 173 (H) 65 - 99 mg/dL   BUN 19 7 - 25 mg/dL   Creat 1.66 (H) 0.70 - 1.25 mg/dL    Comment:   For patients > or = 62 years of age: The  upper reference limit for Creatinine is approximately 13% higher for people identified as African-American.      Total Bilirubin 0.8 0.2 - 1.2 mg/dL   Alkaline Phosphatase 89 40 - 115 U/L   AST 12 10 - 35 U/L   ALT 14 9 - 46 U/L   Total Protein 6.6 6.1 - 8.1 g/dL   Albumin 4.0 3.6 - 5.1 g/dL   Calcium 8.9 8.6 - 10.3 mg/dL   GFR, Est African American 50 (L) >=60 mL/min   GFR, Est Non African American 44 (L) >=60 mL/min  POCT CBC     Status: Abnormal   Collection Time: 09/06/15  9:46 AM  Result Value Ref Range   WBC 6.6 4.6 - 10.2 K/uL   Lymph, poc 2.5 0.6 - 3.4   POC LYMPH PERCENT 38.2 10 - 50 %L   MID (cbc) 0.8 0 - 0.9   POC MID % 11.6 0 - 12 %M   POC Granulocyte 3.3 2 - 6.9   Granulocyte percent 50.2 37 - 80 %G   RBC 4.71 4.69 - 6.13 M/uL   Hemoglobin 15.9 14.1 - 18.1 g/dL   HCT, POC 44.2 43.5 - 53.7 %   MCV 93.8 80 - 97 fL   MCH, POC 33.8 (A) 27 - 31.2 pg   MCHC 36.0 (A) 31.8 - 35.4 g/dL   RDW, POC 14.0 %   Platelet Count, POC 135 (A) 142 - 424 K/uL   MPV 6.4 0 - 99.8 fL  POCT glucose (manual entry)     Status: Abnormal   Collection Time: 09/06/15  9:47 AM  Result Value Ref Range   POC Glucose 177 (A) 70 - 99 mg/dl  POCT glycosylated hemoglobin (Hb A1C)     Status: None   Collection Time: 09/06/15  9:53 AM  Result Value Ref Range   Hemoglobin A1C 7.0   WOUND CULTURE     Status: None   Collection Time: 09/12/15 12:03 PM  Result Value Ref Range   Gram Stain Rare  Gram Stain WBC present-predominately PMN    Gram Stain No Squamous Epithelial Cells Seen    Gram Stain Moderate Gram Positive Cocci In Pairs    Gram Stain Rare Gram Negative Rods    Organism ID, Bacteria Multiple Organisms Present,None Predominant     Comment: No Staphylococcus aureus isolated NO GROUP A STREP (S. PYOGENES) ISOLATED   POCT CBC     Status: Abnormal   Collection Time: 09/12/15 12:09 PM  Result Value Ref Range   WBC 6.2 4.6 - 10.2 K/uL   Lymph, poc 2.0 0.6 - 3.4   POC LYMPH PERCENT  32.5 10 - 50 %L   MID (cbc) 0.6 0 - 0.9   POC MID % 9.3 0 - 12 %M   POC Granulocyte 3.6 2 - 6.9   Granulocyte percent 58.2 37 - 80 %G   RBC 4.73 4.69 - 6.13 M/uL   Hemoglobin 15.8 14.1 - 18.1 g/dL   HCT, POC 44.1 43.5 - 53.7 %   MCV 93.2 80 - 97 fL   MCH, POC 33.5 (A) 27 - 31.2 pg   MCHC 35.9 (A) 31.8 - 35.4 g/dL   RDW, POC 13.8 %   Platelet Count, POC 133 (A) 142 - 424 K/uL   MPV 6.4 0 - 99.8 fL  POCT glucose (manual entry)     Status: Abnormal   Collection Time: 09/12/15 12:12 PM  Result Value Ref Range   POC Glucose 414 (A) 70 - 99 mg/dl    Objective: There were no vitals filed for this visit.  General: Patient is awake, alert, oriented x 3 and in no acute distress.  Dermatology: Skin is warm and dry bilateral with a Partial thickness ulceration present  Plantar right hallux. Ulceration measures 1cm x 0.5cm x 0.3 cm. There is a keratotic border with a granular base. The ulceration does not probe to bone. There is no malodor, no active drainage, no acute erythema, no edema. No acute signs of infection. To the first interspace. There is a dry fissure, likely secondary to bandaging around toe. No other open lesions.    Vascular: Dorsalis Pedis pulse = 0/4 Bilateral,  Posterior Tibial pulse = 0/4 Bilateral,  Capillary Fill Time < 5 seconds. 1+ pitting edema and mild varicosities.  Neurologic: Protective sensation severely diminished to the level of the ankles with the 5.07/10g [employer].  Musculosketal: No Pain with palpation to ulcerated area. No pain with compression to calves bilateral.  Assessment and Plan:  Problem List Items Addressed This Visit      Musculoskeletal and Integument   Toe ulcer, right ([name])     Other   Decreased pedal pulses    Other Visit Diagnoses    Cellulitis and abscess of toe, right    -  Primary   Diabetic neuropathy with neurologic complication ([place])          -Examined patient and discussed the progression of the wound  and treatment alternatives. -Previous Xrays from 09/12/2015 reviewed; no acute changes suggestive of osteo-myelitis -Previous wound culture reviewed from 09/14/2015 revealing no predominant organisms - Excisionally dedbrided ulceration to healthy bleeding borders using a sterile chisel blade at right hallux. -Applied Iodosorb and dry sterile dressing and instructed patient to continue with daily dressings at home consisting of Silvadene and bandaid/dry sterile dressing with care to avoid wrapping dressings to tightly around big toe to worsen fissure in the first interspace, right foot. - Advised patient to go to the ER or return to  office if the wound worsens or if constitutional symptoms are present. -Continue with current oral antibiotics of doxycycline and Cipro until completed -Continue with postoperative shoe -Ordered vascular studies to have completed -Patient to return to office after vascular studies for follow up care and evaluation or sooner if problems arise.  Landis Martins, DPM

## 2015-09-19 NOTE — Telephone Encounter (Signed)
Faxed doppler orders to [facility].

## 2015-09-25 ENCOUNTER — Telehealth: Payer: Self-pay | Admitting: *Deleted

## 2015-09-25 MED ORDER — CIPROFLOXACIN HCL 500 MG PO TABS: 500.0000 mg | ORAL_TABLET | Freq: Two times a day (BID) | ORAL | 0 refills | Status: DC

## 2015-09-25 MED ORDER — DOXYCYCLINE HYCLATE 100 MG PO CAPS: 100.0000 mg | ORAL_CAPSULE | Freq: Two times a day (BID) | ORAL | 0 refills | Status: DC

## 2015-09-25 NOTE — Telephone Encounter (Signed)
Thank you :)

## 2015-09-25 NOTE — Telephone Encounter (Addendum)
Daniel [name] - [facility] states pt did not keep doppler appt. Left message requesting call back concerning the antibiotic refills. Pt called states he missed the doppler appt 09/21/2015, rescheduled 10/05/2015. Pt states the center of the toe looks better no drainage, but the side of the toe has opened and is draining.  [name] states refill both cipro and doxycycline. 10/04/2015-Pt called left name, DOB, and phone number. I left message requesting pt call again, and reminded him of the vascular study schedule 10/05/2015. Pt states he had the test last week. 10/05/2015-Pt left message stating his next appt with [name] is 10/09/2015 and his 3 antibiotic will have run out by then. Left message to call with the status of his toe.

## 2015-10-01 ENCOUNTER — Ambulatory Visit: Payer: BLUE CROSS/BLUE SHIELD | Admitting: Sports Medicine

## 2015-10-02 ENCOUNTER — Encounter: Payer: Self-pay | Admitting: *Deleted

## 2015-10-02 ENCOUNTER — Telehealth: Payer: Self-pay

## 2015-10-02 NOTE — Telephone Encounter (Signed)
Ok to do

## 2015-10-02 NOTE — Telephone Encounter (Signed)
Pt is needing to get insulin 50 units instead of 30 for lantus   Best number [phone]  Pharmacy is [facility] in [place]

## 2015-10-03 MED ORDER — INSULIN GLARGINE 100 UNIT/ML ~~LOC~~ SOLN: SUBCUTANEOUS | 1 refills | Status: DC

## 2015-10-03 NOTE — Telephone Encounter (Signed)
Okay to send in increased dose.

## 2015-10-03 NOTE — Telephone Encounter (Signed)
Increased dose sent

## 2015-10-09 ENCOUNTER — Ambulatory Visit (INDEPENDENT_AMBULATORY_CARE_PROVIDER_SITE_OTHER): Payer: BLUE CROSS/BLUE SHIELD | Admitting: Sports Medicine

## 2015-10-09 ENCOUNTER — Encounter: Payer: Self-pay | Admitting: Sports Medicine

## 2015-10-09 VITALS — BP 136/73 | HR 87 | Resp 16

## 2015-10-09 DIAGNOSIS — L97511 Non-pressure chronic ulcer of other part of right foot limited to breakdown of skin: Secondary | ICD-10-CM

## 2015-10-09 DIAGNOSIS — E1149 Type 2 diabetes mellitus with other diabetic neurological complication: Secondary | ICD-10-CM | POA: Diagnosis not present

## 2015-10-09 DIAGNOSIS — R0989 Other specified symptoms and signs involving the circulatory and respiratory systems: Secondary | ICD-10-CM | POA: Diagnosis not present

## 2015-10-09 DIAGNOSIS — E1140 Type 2 diabetes mellitus with diabetic neuropathy, unspecified: Secondary | ICD-10-CM | POA: Diagnosis not present

## 2015-10-09 DIAGNOSIS — L03031 Cellulitis of right toe: Secondary | ICD-10-CM

## 2015-10-09 DIAGNOSIS — L02611 Cutaneous abscess of right foot: Secondary | ICD-10-CM | POA: Diagnosis not present

## 2015-10-09 MED ORDER — CEPHALEXIN 500 MG PO CAPS: 500.0000 mg | ORAL_CAPSULE | Freq: Four times a day (QID) | ORAL | 0 refills | Status: DC

## 2015-10-09 NOTE — Progress Notes (Signed)
Subjective: Daniel [name] is a 60 y.o. male patient seen in office for evaluation of ulceration of the right hallux and 5th toe; reports has been dressing with antibiotic(silvadene) cream daily. Patient finished doxycycline and ciprofloxacin. Patient has a history of diabetes and a blood glucose level not checked, reports that he wants more antibiotics; does not want it to get worse. Denies nausea/fever/vomiting/chills/night sweats/shortness of breath/pain. Patient has no other pedal complaints at this time.  Patient Active Problem List   Diagnosis Date Noted  . Toe ulcer, right ([place]) 09/19/2015  . Decreased pedal pulses 09/19/2015  . OSA on CPAP 09/05/2015  . Major depressive disorder, recurrent episode, moderate ([place]) 09/05/2015  . OSA (obstructive sleep apnea) 07/25/2015  . Passed out 06/28/2015  . Low back pain 06/11/2015  . Abnormality of gait 06/11/2015  . Dizziness 03/17/2015  . Weakness 02/21/2015  . Chronic renal insufficiency, stage III (moderate) 08/09/2014  . Diabetes ([place]) 11/07/2013  . Hematuria 06/21/2013  . Hepatic steatosis 09/09/2010  . UTI 09/15/2006  . PYURIA 09/03/2006  . Human immunodeficiency virus (HIV) disease ([place]) 06/04/2006  . HERPES ZOSTER, UNCOMPLICATED 08/67/6195  . HSV 06/04/2006  . Depression 06/04/2006  . DISORDER, ATTENTION DEFICIT W/HYPERACTIVITY 06/04/2006  . THROMBOPHLEBITIS NOS 06/04/2006  . GERD 06/04/2006  . ARTHRITIS, HAND 06/04/2006  . HYPERGLYCEMIA, HX OF 06/04/2006  . HEPATITIS B, HX OF 06/04/2006   Current Outpatient Prescriptions on File Prior to Visit  Medication Sig Dispense Refill  . abacavir-dolutegravir-lamiVUDine (TRIUMEQ) 600-50-300 MG tablet Take 1 tablet by mouth daily. 90 tablet 1  . ALPRAZolam (XANAX) 1 MG tablet Take 1 mg by mouth 3 (three) times daily as needed for anxiety.     [name] amphetamine-dextroamphetamine (ADDERALL) 30 MG tablet Take 30 mg by mouth 3 (three) times daily.     [name] atorvastatin (LIPITOR) 10 MG tablet  TAKE ONE TABLET BY MOUTH ONCE DAILY. 90 tablet 11  . Blood Glucose Monitoring Suppl (BLOOD GLUCOSE METER KIT AND SUPPLIES) Dispense based on patient and insurance preference. Use up to four times daily as directed. (FOR ICD-9 250.00, 250.01). 1 each 0  . divalproex (DEPAKOTE ER) 500 MG 24 hr tablet Take 1 tablet (500 mg total) by mouth at bedtime. 30 tablet 11  . doxycycline (VIBRA-TABS) 100 MG tablet Take 1 tablet (100 mg total) by mouth 2 (two) times daily. 20 tablet 0  . doxycycline (VIBRAMYCIN) 100 MG capsule Take 1 capsule (100 mg total) by mouth 2 (two) times daily. 20 capsule 0  . glucose blood test strip Test blood sugar daily. Dx E11.9 50 each 11  . insulin glargine (LANTUS) 100 UNIT/ML injection Inject 50 units into skin at bedtime 10 mL 1  . Insulin Pen Needle (PEN NEEDLES 3/16") 31G X 5 MM MISC Use to inject Lantus daily. 100 each 3  . levothyroxine (SYNTHROID, LEVOTHROID) 50 MCG tablet Take 1 tablet (50 mcg total) by mouth at bedtime. 30 tablet 11  . pregabalin (LYRICA) 75 MG capsule TAKE ONE CAPSULE BY MOUTH THREE DAILY. 90 capsule 3  . prochlorperazine (COMPAZINE) 10 MG tablet TAKE ONE TABLET BY MOUTH EVERY 6 HOURS AS NEEDED. 120 tablet 0  . ranitidine (ZANTAC) 150 MG tablet Take 150 mg by mouth daily.    . rivaroxaban (XARELTO) 15 MG TABS tablet Take 1 tablet (15 mg total) by mouth daily. 30 tablet 11  . silver sulfADIAZINE (SILVADENE) 1 % cream Apply 1 application topically daily. 50 g 1  . TRINTELLIX 20 MG TABS Take 20 mg  by mouth at bedtime.    [name] UNABLE TO FIND CPAP MACHINE with standard Aclaim nasal mask with humidifier. Set at 14 cwp 1 each 0  . VOLTAREN 1 % GEL APPLY 2 GRAMS TO EACH KNEE IN THE MORNING AND AT BEDTIME AND APPLY 1 GRAM TO EACH KNEE IN THE AFTERNOON. 300 g 5  . zolpidem (AMBIEN) 10 MG tablet Take 10-20 mg by mouth at bedtime as needed for sleep.      No current facility-administered medications on file prior to visit.    Allergies  Allergen Reactions  .  Aspirin Swelling  . Ibuprofen Swelling  . Sustiva [Efavirenz] Rash and Swelling    And rash.  . Acetaminophen     States he was told by Nephrologist not to use Tylenol due to CKD.    . Nsaids Other (See Comments)    unknwn    Recent Results (from the past 2160 hour(s))  Urine cytology ancillary only     Status: None   Collection Time: 08/07/15 12:00 AM  Result Value Ref Range   Chlamydia Negative     Comment: Normal Reference Range - Negative   Neisseria gonorrhea Negative     Comment: Normal Reference Range - Negative  T-helper cell (CD4)- ([facility] clinic only)     Status: None   Collection Time: 08/07/15  2:00 PM  Result Value Ref Range   CD4 T Cell Abs 680 400 - 2,700 /uL   CD4 % Helper T Cell 33 33 - 55 %    Comment: Performed at [facility]  HIV 1 RNA quant-no reflex-bld     Status: None   Collection Time: 08/07/15  4:23 PM  Result Value Ref Range   HIV 1 RNA Quant <20 <20 copies/mL    Comment:                                 NOT DETECTED   HIV1 RNA Quant, Log <1.30 <1.30 Log copies/mL    Comment:   This test was performed using the COBAS AmpliPrep/COBAS TaqMan HIV-1 test kit version 2.0. ([place].)   Comp Met (CMET)     Status: Abnormal   Collection Time: 08/07/15  4:23 PM  Result Value Ref Range   Sodium 138 135 - 146 mmol/L   Potassium 4.3 3.5 - 5.3 mmol/L   Chloride 103 98 - 110 mmol/L   CO2 25 20 - 31 mmol/L   Glucose, Bld 140 (H) 65 - 99 mg/dL   BUN 16 7 - 25 mg/dL   Creat 1.52 (H) 0.70 - 1.25 mg/dL    Comment:   For patients > or = 62 years of age: The upper reference limit for Creatinine is approximately 13% higher for people identified as African-American.      Total Bilirubin 0.9 0.2 - 1.2 mg/dL   Alkaline Phosphatase 94 40 - 115 U/L   AST 15 10 - 35 U/L   ALT 18 9 - 46 U/L   Total Protein 6.4 6.1 - 8.1 g/dL   Albumin 3.9 3.6 - 5.1 g/dL   Calcium 8.8 8.6 - 10.3 mg/dL  CBC with Differential/Platelet     Status:  Abnormal   Collection Time: 08/07/15  4:23 PM  Result Value Ref Range   WBC 7.2 3.8 - 10.8 K/uL   RBC 4.82 4.20 - 5.80 MIL/uL   Hemoglobin 16.1 13.2 - 17.1 g/dL  HCT 45.7 38.5 - 50.0 %   MCV 94.8 80.0 - 100.0 fL   MCH 33.4 (H) 27.0 - 33.0 pg   MCHC 35.2 32.0 - 36.0 g/dL   RDW 14.6 11.0 - 15.0 %   Platelets 123 (L) 140 - 400 K/uL   MPV 9.1 7.5 - 12.5 fL   Neutro Abs 4,320 1,500 - 7,800 cells/uL   Lymphs Abs 2,016 850 - 3,900 cells/uL   Monocytes Absolute 792 200 - 950 cells/uL   Eosinophils Absolute 72 15 - 500 cells/uL   Basophils Absolute 0 0 - 200 cells/uL   Neutrophils Relative % 60 %   Lymphocytes Relative 28 %   Monocytes Relative 11 %   Eosinophils Relative 1 %   Basophils Relative 0 %   Smear Review Criteria for review not met     Comment: ** Please note change in unit of measure and reference range(s). **  RPR     Status: None   Collection Time: 08/07/15  4:23 PM  Result Value Ref Range   RPR Ser Ql NON REAC NON REAC  Lipid panel     Status: None   Collection Time: 08/09/15  9:56 AM  Result Value Ref Range   Cholesterol 154 125 - 200 mg/dL   Triglycerides 108 <150 mg/dL   HDL 47 >=40 mg/dL   Total CHOL/HDL Ratio 3.3 <=5.0 Ratio   VLDL 22 <30 mg/dL   LDL Cholesterol 85 <130 mg/dL    Comment:   Total Cholesterol/HDL Ratio:CHD Risk                        Coronary Heart Disease Risk Table                                        Men       Women          1/2 Average Risk              3.4        3.3              Average Risk              5.0        4.4           2X Average Risk              9.6        7.1           3X Average Risk             23.4       11.0 Use the calculated Patient Ratio above and the CHD Risk table  to determine the patient's CHD Risk.   COMPLETE METABOLIC PANEL WITH GFR     Status: Abnormal   Collection Time: 09/06/15  9:23 AM  Result Value Ref Range   Sodium 141 135 - 146 mmol/L   Potassium 4.1 3.5 - 5.3 mmol/L   Chloride 106 98 - 110  mmol/L   CO2 27 20 - 31 mmol/L   Glucose, Bld 173 (H) 65 - 99 mg/dL   BUN 19 7 - 25 mg/dL   Creat 1.66 (H) 0.70 - 1.25 mg/dL    Comment:   For patients > or = 62 years of age: The upper reference limit for Creatinine  is approximately 13% higher for people identified as African-American.      Total Bilirubin 0.8 0.2 - 1.2 mg/dL   Alkaline Phosphatase 89 40 - 115 U/L   AST 12 10 - 35 U/L   ALT 14 9 - 46 U/L   Total Protein 6.6 6.1 - 8.1 g/dL   Albumin 4.0 3.6 - 5.1 g/dL   Calcium 8.9 8.6 - 10.3 mg/dL   GFR, Est African American 50 (L) >=60 mL/min   GFR, Est Non African American 44 (L) >=60 mL/min  POCT CBC     Status: Abnormal   Collection Time: 09/06/15  9:46 AM  Result Value Ref Range   WBC 6.6 4.6 - 10.2 K/uL   Lymph, poc 2.5 0.6 - 3.4   POC LYMPH PERCENT 38.2 10 - 50 %L   MID (cbc) 0.8 0 - 0.9   POC MID % 11.6 0 - 12 %M   POC Granulocyte 3.3 2 - 6.9   Granulocyte percent 50.2 37 - 80 %G   RBC 4.71 4.69 - 6.13 M/uL   Hemoglobin 15.9 14.1 - 18.1 g/dL   HCT, POC 44.2 43.5 - 53.7 %   MCV 93.8 80 - 97 fL   MCH, POC 33.8 (A) 27 - 31.2 pg   MCHC 36.0 (A) 31.8 - 35.4 g/dL   RDW, POC 14.0 %   Platelet Count, POC 135 (A) 142 - 424 K/uL   MPV 6.4 0 - 99.8 fL  POCT glucose (manual entry)     Status: Abnormal   Collection Time: 09/06/15  9:47 AM  Result Value Ref Range   POC Glucose 177 (A) 70 - 99 mg/dl  POCT glycosylated hemoglobin (Hb A1C)     Status: None   Collection Time: 09/06/15  9:53 AM  Result Value Ref Range   Hemoglobin A1C 7.0   WOUND CULTURE     Status: None   Collection Time: 09/12/15 12:03 PM  Result Value Ref Range   Gram Stain Rare    Gram Stain WBC present-predominately PMN    Gram Stain No Squamous Epithelial Cells Seen    Gram Stain Moderate Gram Positive Cocci In Pairs    Gram Stain Rare Gram Negative Rods    Organism ID, Bacteria Multiple Organisms Present,None Predominant     Comment: No Staphylococcus aureus isolated NO GROUP A STREP (S.  PYOGENES) ISOLATED   POCT CBC     Status: Abnormal   Collection Time: 09/12/15 12:09 PM  Result Value Ref Range   WBC 6.2 4.6 - 10.2 K/uL   Lymph, poc 2.0 0.6 - 3.4   POC LYMPH PERCENT 32.5 10 - 50 %L   MID (cbc) 0.6 0 - 0.9   POC MID % 9.3 0 - 12 %M   POC Granulocyte 3.6 2 - 6.9   Granulocyte percent 58.2 37 - 80 %G   RBC 4.73 4.69 - 6.13 M/uL   Hemoglobin 15.8 14.1 - 18.1 g/dL   HCT, POC 44.1 43.5 - 53.7 %   MCV 93.2 80 - 97 fL   MCH, POC 33.5 (A) 27 - 31.2 pg   MCHC 35.9 (A) 31.8 - 35.4 g/dL   RDW, POC 13.8 %   Platelet Count, POC 133 (A) 142 - 424 K/uL   MPV 6.4 0 - 99.8 fL  POCT glucose (manual entry)     Status: Abnormal   Collection Time: 09/12/15 12:12 PM  Result Value Ref Range   POC Glucose 414 (A) 70 - 99  mg/dl    Objective: There were no vitals filed for this visit.  General: Patient is awake, alert, oriented x 3 and in no acute distress.  Dermatology: Skin is warm and dry bilateral with a Partial thickness ulceration present Plantar right hallux and 4th webspace and lateral 5th toe on right. Ulcerations measures <0.5cm There are mild scaly keratotic borders with a granular bases. The ulcerations does not probe to bone. There is no malodor, no active drainage, no acute erythema, no edema. No acute signs of infection. There is dry fissuring and ecchymosis likely secondary to bandages. No other open lesions.    Vascular: Dorsalis Pedis pulse = 0/4 Bilateral,  Posterior Tibial pulse = 0/4 Bilateral,  Capillary Fill Time < 5 seconds. 1+ pitting edema and mild varicosities.  Neurologic: Protective sensation severely diminished to the level of the ankles with the 5.07/10g [employer].  Musculosketal: No Pain with palpation to ulcerated areas. No pain with compression to calves bilateral.  Assessment and Plan:  Problem List Items Addressed This Visit      Musculoskeletal and Integument   Toe ulcer, right ([name])   Relevant Medications   cephALEXin  (KEFLEX) 500 MG capsule    Other Visit Diagnoses    Diminished pulses in lower extremity    -  Primary   Relevant Medications   cephALEXin (KEFLEX) 500 MG capsule   Cellulitis and abscess of toe, right       Relevant Medications   cephALEXin (KEFLEX) 500 MG capsule   Diabetic neuropathy with neurologic complication ([name])       Relevant Medications   cephALEXin (KEFLEX) 500 MG capsule      -Examined patient and discussed the progression of the wounds and treatment alternatives. -Awaiting review of vascular tests; will call patient with results -Previous Xrays from 09/12/2015 reviewed; no acute changes suggestive of osteo-myelitis -Previous wound culture reviewed from 09/14/2015 revealing no predominant organisms - Cleansed ulcerations right foot -Applied Iodosorb and dry sterile dressing and instructed patient to continue with daily dressings at home consisting of Silvadene and bandaid/dry sterile dressing with care to avoid wrapping dressings to tightly around toes. -Rx Keflex for preventive measures -Advised patient to go to the ER or return to office if the wound worsens or if constitutional symptoms are present. -Continue with postoperative shoe -Patient to return to office in 2-3 weeks for follow up care and evaluation or sooner if problems arise.  Landis Martins, DPM

## 2015-10-10 ENCOUNTER — Ambulatory Visit (INDEPENDENT_AMBULATORY_CARE_PROVIDER_SITE_OTHER): Payer: BLUE CROSS/BLUE SHIELD | Admitting: Neurology

## 2015-10-10 ENCOUNTER — Encounter: Payer: Self-pay | Admitting: Neurology

## 2015-10-10 VITALS — BP 143/85 | HR 80 | Ht 70.5 in | Wt 233.0 lb

## 2015-10-10 DIAGNOSIS — R269 Unspecified abnormalities of gait and mobility: Secondary | ICD-10-CM

## 2015-10-10 DIAGNOSIS — R55 Syncope and collapse: Secondary | ICD-10-CM

## 2015-10-10 DIAGNOSIS — G4733 Obstructive sleep apnea (adult) (pediatric): Secondary | ICD-10-CM

## 2015-10-10 DIAGNOSIS — G6289 Other specified polyneuropathies: Secondary | ICD-10-CM

## 2015-10-10 DIAGNOSIS — IMO0002 Reserved for concepts with insufficient information to code with codable children: Secondary | ICD-10-CM

## 2015-10-10 NOTE — Progress Notes (Signed)
Chief Complaint  Patient presents with  . Diabetic Neuropathy    He would like to review his NCV/EMG results.  He is still having numbness/tingling in his bilateral feet.  . Confusion Spells    He would like to review his EEG.  No further spells of confusion have occurred.      PATIENT: Daniel [name] DOB: 1955/04/16  Chief Complaint  Patient presents with  . Diabetic Neuropathy    He would like to review his NCV/EMG results.  He is still having numbness/tingling in his bilateral feet.  . Confusion Spells    He would like to review his EEG.  No further spells of confusion have occurred.     HISTORICAL  Daniel [name] is a 60 years old right-handed male , seen in refer by Dr. Brett Fairy and his primary care physician Dr. Ivar Bury for evaluation of unsteady Gait in June 11 2015  I have reviewed and summarized referring note, he had a history of hypertension, hyperlipidemia, diabetes, HIV for more than 10 years, on treatment, most recent CD4 was 740, obstructive sleep apnea, using CPAP machine, hx of right leg DVT, PE on chronic anticoagulations, xelrato, he used to work for a [employer], which requires ladder climbing, last time he worked was December 25 2014.  He has some chronic low back pain, neck pain, but denies radiating pain to upper or lower extremity, he has bilateral feet paresthesia due to diabetic peripheral neuropathy for many years, denies bowel and bladder incontinence  In February 21 2015, he fell asleep on his sofa, when he tried to get up next morning, he had difficulty bearing weight, bilateral lower extremity weakness, he was taken to the [facility], his bilateral lower extremity gradually improved, he was discharged the same day, was able to ambulate again, there was no clear etiology found.  But since that event, he fell 3 times over the past few months, his right toes tend to be caught on the floor, sometimes without clear triggers.  I have  personally reviewed MRI of the brain without contrast in March 2017, mild supratentorium small vessel disease,  right ventral pontine small vessel disease, less likely explaining his complains of gait difficulty   UPDATE May 4th 2017: Echocardiogram in February 2017: Ejection fraction 60-65%, word thickness was increased in a pattern of mild left ventricular hypertrophy.  We have personally reviewed MRI of lumbar in April 2017:  At L4-L5 there is moderate facet hypertrophy causing mild foraminal and minimal lateral recess narrowing. There is no nerve root impingement.  L5-S1 there is a small midline disc protrusion and mild facet hypertrophy. There is mild right and minimal left foraminal narrowing. There does not appear to be any nerve root impingement.  Recent sleep study confirmed obstructive sleep apnea, CPAP is recommended  Today he complains lightheadedness with sudden positional change, intermittent bilateral feet paresthesia, he continue have falling episode, when he woke up from sleep, sofa, confused, could not get up  UPDATE August 16th 2017: EEG was normal in June 2017. EMG nerve conduction study consistent with mild axonal peripheral neuropathy consistent with his diagnosis of diabetes, He was put on Depakote ER 500 mg every night since May 2017, there was no recurrent confusion episode but he has occasionally bilateral lower extremity give out underneath him, difficulty getting up, he also complains of frequent orthostatic dizziness.  He was evaluated by sleep specialist Dr. Brett Fairy, suggested CPAP,    Reviewed laboratory evaluation A1c 7.0 in  July, normal CBC, hemoglobin 15 point 9, but in September 12 2015, glucose level was 414, creatinine 1.66,  He also complains of chronic migraine headaches,  REVIEW OF SYSTEMS: Full 14 system review of systems performed and notable only for headaches, decreased concentration, depression anxiety, back pain, neck pain, right foot nonhealing wound,  apnea, daytime sleepiness, snoring, leg swelling, eye itching, runny nose, hearing loss   ALLERGIES: Allergies  Allergen Reactions  . Aspirin Swelling  . Ibuprofen Swelling  . Sustiva [Efavirenz] Rash and Swelling    And rash.  . Acetaminophen     States he was told by Nephrologist not to use Tylenol due to CKD.    . Nsaids Other (See Comments)    unknwn    HOME MEDICATIONS: Current Outpatient Prescriptions  Medication Sig Dispense Refill  . abacavir-dolutegravir-lamiVUDine (TRIUMEQ) 600-50-300 MG tablet Take 1 tablet by mouth daily. 90 tablet 1  . ALPRAZolam (XANAX) 1 MG tablet Take 1 mg by mouth 3 (three) times daily as needed for anxiety.     [name] amphetamine-dextroamphetamine (ADDERALL) 30 MG tablet Take 30 mg by mouth 3 (three) times daily.     [name] atorvastatin (LIPITOR) 10 MG tablet TAKE ONE TABLET BY MOUTH ONCE DAILY. 90 tablet 11  . Blood Glucose Monitoring Suppl (BLOOD GLUCOSE METER KIT AND SUPPLIES) Dispense based on patient and insurance preference. Use up to four times daily as directed. (FOR ICD-9 250.00, 250.01). 1 each 0  . cephALEXin (KEFLEX) 500 MG capsule Take 1 capsule (500 mg total) by mouth 4 (four) times daily. 40 capsule 0  . divalproex (DEPAKOTE ER) 500 MG 24 hr tablet Take 1 tablet (500 mg total) by mouth at bedtime. 30 tablet 11  . doxycycline (VIBRA-TABS) 100 MG tablet Take 1 tablet (100 mg total) by mouth 2 (two) times daily. 20 tablet 0  . doxycycline (VIBRAMYCIN) 100 MG capsule Take 1 capsule (100 mg total) by mouth 2 (two) times daily. 20 capsule 0  . glucose blood test strip Test blood sugar daily. Dx E11.9 50 each 11  . insulin glargine (LANTUS) 100 UNIT/ML injection Inject 50 units into skin at bedtime 10 mL 1  . Insulin Pen Needle (PEN NEEDLES 3/16") 31G X 5 MM MISC Use to inject Lantus daily. 100 each 3  . levothyroxine (SYNTHROID, LEVOTHROID) 50 MCG tablet Take 1 tablet (50 mcg total) by mouth at bedtime. 30 tablet 11  . pregabalin (LYRICA) 75 MG  capsule TAKE ONE CAPSULE BY MOUTH THREE DAILY. 90 capsule 3  . prochlorperazine (COMPAZINE) 10 MG tablet TAKE ONE TABLET BY MOUTH EVERY 6 HOURS AS NEEDED. 120 tablet 0  . ranitidine (ZANTAC) 150 MG tablet Take 150 mg by mouth daily.    . rivaroxaban (XARELTO) 15 MG TABS tablet Take 1 tablet (15 mg total) by mouth daily. 30 tablet 11  . silver sulfADIAZINE (SILVADENE) 1 % cream Apply 1 application topically daily. 50 g 1  . TRINTELLIX 20 MG TABS Take 20 mg by mouth at bedtime.    [name] UNABLE TO FIND CPAP MACHINE with standard Aclaim nasal mask with humidifier. Set at 14 cwp 1 each 0  . VOLTAREN 1 % GEL APPLY 2 GRAMS TO EACH KNEE IN THE MORNING AND AT BEDTIME AND APPLY 1 GRAM TO EACH KNEE IN THE AFTERNOON. 300 g 5  . zolpidem (AMBIEN) 10 MG tablet Take 10-20 mg by mouth at bedtime as needed for sleep.      No current facility-administered medications for this visit.  PAST MEDICAL HISTORY: Past Medical History:  Diagnosis Date  . ADHD (attention deficit hyperactivity disorder)   . Anxiety   . Chronic kidney disease   . Clotting disorder ([place])   . Depression   . Diabetes mellitus without complication ([place])   . Diabetes mellitus, type II ([place])   . Dizziness 03/17/2015  . GERD (gastroesophageal reflux disease)   . HIV infection ([place])   . Liver disease   . OSA (obstructive sleep apnea) 07/25/2015   Uses CPAP regularly  . Ulcer     PAST SURGICAL HISTORY: Past Surgical History:  Procedure Laterality Date  . SMALL INTESTINE SURGERY    . STOMACH SURGERY      FAMILY HISTORY: Family History  Problem Relation Age of Onset  . Cancer Brother   . Depression Brother   . COPD Mother     SOCIAL HISTORY:  Social History   Social History  . Marital Status: Single    Spouse Name: N/A  . Number of Children: N/A  . Years of Education: N/A   Occupational History  . Used to works for [employer] require ladder climbing, last work was October 30 first 2016    Social History Main  Topics  . Smoking status: Former Smoker -- 0.10 packs/day for 10 years    Types: Cigars, Cigarettes  . Smokeless tobacco: Never Used  . Alcohol Use: No  . Drug Use: No  . Sexual Activity:    Partners: Male     Comment: pt. declined condoms   Other Topics Concern  . Not on file   Social History Narrative   Epworth Sleepiness Scale = 7 (as of 03/16/2015)     PHYSICAL EXAM   Vitals:   10/10/15 1516  BP: (!) 143/85  Pulse: 80  Weight: 233 lb (105.7 kg)  Height: 5' 10.5" (1.791 m)    Not recorded      Body mass index is 32.96 kg/m.  PHYSICAL EXAMNIATION:  Gen: NAD, conversant, well nourised, obese, well groomed                     Cardiovascular: Regular rate rhythm, no peripheral edema, warm, nontender. Eyes: Conjunctivae clear without exudates or hemorrhage Neck: Supple, no carotid bruise. Pulmonary: Clear to auscultation bilaterally   NEUROLOGICAL EXAM:  MENTAL STATUS: Speech:    Speech is normal; fluent and spontaneous with normal comprehension.  Cognition:     Orientation to time, place and person     Normal recent and remote memory     Normal Attention span and concentration     Normal Language, naming, repeating,spontaneous speech     Fund of knowledge   CRANIAL NERVES: CN II: Visual fields are full to confrontation. Fundoscopic exam is normal with sharp discs and no vascular changes. Pupils are round equal and briskly reactive to light. CN III, IV, VI: extraocular movement are normal. No ptosis. CN V: Facial sensation is intact to pinprick in all 3 divisions bilaterally. Corneal responses are intact.  CN VII: Face is symmetric with normal eye closure and smile. CN VIII: Hearing is normal to rubbing fingers CN IX, X: Palate elevates symmetrically. Phonation is normal. CN XI: Head turning and shoulder shrug are intact CN XII: Tongue is midline with normal movements and no atrophy.  MOTOR: There is no pronator drift of out-stretched arms. Muscle bulk  and tone are normal. Muscle strength is normal.  REFLEXES: Reflexes are 2+ and symmetric at the biceps, triceps,  1  at bilateral knees, and absent at  ankles. Plantar responses are flexor.  SENSORY: Mildly length dependent decreased light touch pinprick to ankle level  COORDINATION: Rapid alternating movements and fine finger movements are intact. There is no dysmetria on finger-to-nose and heel-knee-shin.    GAIT/STANCE: Posture is  hesitate, moderate stride, able to stand up on heel and toe, he could not perform tandem walking, Romberg signs was negative  DIAGNOSTIC DATA (LABS, IMAGING, TESTING) - I reviewed patient records, labs, notes, testing and imaging myself where available.   ASSESSMENT AND PLAN  Daniel [name] is a 60 y.o. male    Diabetic peripheral neuropathy  Repeat A1c was 7.0 in July 2017  EMG nerve conduction confirmed mild axonal peripheral neuropathy  I have suggested continue moderate exercise, tight control of diabetes, keep well hydration   Chronic migraine headaches, confusion spells  Has helped by Depakote ER 500 mg every night  Excedrin Migraine as needed     Marcial Pacas, M.D. Ph.D.  [facility] [address] [place], [place] [address] Ph: [phone] Fax: [phone]  CC: [name], MD

## 2015-10-16 ENCOUNTER — Telehealth: Payer: Self-pay

## 2015-10-16 NOTE — Telephone Encounter (Signed)
DAUB - The refill on his zeralto was 15 mg instead of 20 mg.  He wants to know if this was a mistake. [phone]

## 2015-10-18 NOTE — Telephone Encounter (Signed)
Pt advised.

## 2015-10-18 NOTE — Telephone Encounter (Signed)
I decreased the dose slightly because of his diminished renal function. I was concerned about his kidney disease and metabolism of the Xarelto so I slightly decreased the dose. He can also discuss this when he sees the renal specialist. If they feel differently he can let me know. Tell him to be sure and see me before I leave. My last day is the end of September.

## 2015-10-23 ENCOUNTER — Ambulatory Visit: Payer: BLUE CROSS/BLUE SHIELD | Admitting: Sports Medicine

## 2015-10-30 ENCOUNTER — Ambulatory Visit: Payer: BLUE CROSS/BLUE SHIELD | Admitting: Sports Medicine

## 2015-11-01 ENCOUNTER — Telehealth: Payer: Self-pay | Admitting: *Deleted

## 2015-11-01 NOTE — Telephone Encounter (Signed)
Patient missed his appt on Tues and doesn't have another one scheduled until October 10th. Patient asking if he needs to continue with his antibiotics and if he does he needs a refill. Also requesting a refill on the silvadene cream. Please advise.

## 2015-11-01 NOTE — Telephone Encounter (Signed)
For preventative measures refill Keflex 515m tid, 30 tabs (this is enough for 10 days) Patient should not need another refill unless wound starts to worsen.  Refill silvadene cream to continue using daily to ulceration site.  Encourage patient to come in sooner since he missed the 10-30-15 appt.  Thanks Dr. SCannon Kettle

## 2015-11-02 ENCOUNTER — Other Ambulatory Visit: Payer: Self-pay | Admitting: *Deleted

## 2015-11-02 ENCOUNTER — Other Ambulatory Visit: Payer: Self-pay | Admitting: Sports Medicine

## 2015-11-02 DIAGNOSIS — R0989 Other specified symptoms and signs involving the circulatory and respiratory systems: Secondary | ICD-10-CM

## 2015-11-02 DIAGNOSIS — L03031 Cellulitis of right toe: Secondary | ICD-10-CM

## 2015-11-02 DIAGNOSIS — E1140 Type 2 diabetes mellitus with diabetic neuropathy, unspecified: Secondary | ICD-10-CM

## 2015-11-02 DIAGNOSIS — L97511 Non-pressure chronic ulcer of other part of right foot limited to breakdown of skin: Secondary | ICD-10-CM

## 2015-11-02 DIAGNOSIS — L02611 Cutaneous abscess of right foot: Secondary | ICD-10-CM

## 2015-11-02 DIAGNOSIS — E1149 Type 2 diabetes mellitus with other diabetic neurological complication: Secondary | ICD-10-CM

## 2015-11-02 MED ORDER — CEPHALEXIN 500 MG PO CAPS: 500.0000 mg | ORAL_CAPSULE | Freq: Three times a day (TID) | ORAL | 0 refills | Status: DC

## 2015-11-02 MED ORDER — SILVER SULFADIAZINE 1 % EX CREA: 1.0000 "application " | TOPICAL_CREAM | Freq: Every day | CUTANEOUS | 1 refills | Status: DC

## 2015-11-02 NOTE — Telephone Encounter (Signed)
Called pt-L/M-informed him the meds were sent into his pharmacy and that [name] would like to see him sooner than the Oct 10th appt. Advised to call the office and sched.

## 2015-11-07 ENCOUNTER — Ambulatory Visit (INDEPENDENT_AMBULATORY_CARE_PROVIDER_SITE_OTHER): Payer: BLUE CROSS/BLUE SHIELD | Admitting: Sports Medicine

## 2015-11-07 ENCOUNTER — Encounter: Payer: Self-pay | Admitting: Sports Medicine

## 2015-11-07 VITALS — Ht 70.0 in | Wt 240.0 lb

## 2015-11-07 DIAGNOSIS — E1140 Type 2 diabetes mellitus with diabetic neuropathy, unspecified: Secondary | ICD-10-CM | POA: Diagnosis not present

## 2015-11-07 DIAGNOSIS — R0989 Other specified symptoms and signs involving the circulatory and respiratory systems: Secondary | ICD-10-CM | POA: Diagnosis not present

## 2015-11-07 DIAGNOSIS — L89891 Pressure ulcer of other site, stage 1: Secondary | ICD-10-CM | POA: Diagnosis not present

## 2015-11-07 DIAGNOSIS — E1149 Type 2 diabetes mellitus with other diabetic neurological complication: Secondary | ICD-10-CM | POA: Diagnosis not present

## 2015-11-07 DIAGNOSIS — L97511 Non-pressure chronic ulcer of other part of right foot limited to breakdown of skin: Secondary | ICD-10-CM

## 2015-11-07 NOTE — Progress Notes (Signed)
Subjective: Daniel [name] is a 60 y.o. male patient seen in office for evaluation of ulceration of the right hallux and 4th toe; reports has been dressing with antibiotic(silvadene) cream daily. Patient on keflex and has 8 days left. Patient has a history of diabetes and a blood glucose level not checked. Denies nausea/fever/vomiting/chills/night sweats/shortness of breath/pain. Patient has no other pedal complaints at this time.  Patient Active Problem List   Diagnosis Date Noted  . Toe ulcer, right ([place]) 09/19/2015  . Decreased pedal pulses 09/19/2015  . OSA on CPAP 09/05/2015  . Major depressive disorder, recurrent episode, moderate ([place]) 09/05/2015  . OSA (obstructive sleep apnea) 07/25/2015  . Passed out 06/28/2015  . Low back pain 06/11/2015  . Abnormality of gait 06/11/2015  . Dizziness 03/17/2015  . Weakness 02/21/2015  . Chronic renal insufficiency, stage III (moderate) 08/09/2014  . Diabetes ([place]) 11/07/2013  . Hematuria 06/21/2013  . Hepatic steatosis 09/09/2010  . UTI 09/15/2006  . PYURIA 09/03/2006  . Human immunodeficiency virus (HIV) disease ([place]) 06/04/2006  . HERPES ZOSTER, UNCOMPLICATED 93/81/0175  . HSV 06/04/2006  . Depression 06/04/2006  . DISORDER, ATTENTION DEFICIT W/HYPERACTIVITY 06/04/2006  . THROMBOPHLEBITIS NOS 06/04/2006  . GERD 06/04/2006  . ARTHRITIS, HAND 06/04/2006  . HYPERGLYCEMIA, HX OF 06/04/2006  . HEPATITIS B, HX OF 06/04/2006   Current Outpatient Prescriptions on File Prior to Visit  Medication Sig Dispense Refill  . abacavir-dolutegravir-lamiVUDine (TRIUMEQ) 600-50-300 MG tablet Take 1 tablet by mouth daily. 90 tablet 1  . ALPRAZolam (XANAX) 1 MG tablet Take 1 mg by mouth 3 (three) times daily as needed for anxiety.     [name] amphetamine-dextroamphetamine (ADDERALL) 30 MG tablet Take 30 mg by mouth 3 (three) times daily.     [name] atorvastatin (LIPITOR) 10 MG tablet TAKE ONE TABLET BY MOUTH ONCE DAILY. 90 tablet 11  . Blood Glucose Monitoring  Suppl (BLOOD GLUCOSE METER KIT AND SUPPLIES) Dispense based on patient and insurance preference. Use up to four times daily as directed. (FOR ICD-9 250.00, 250.01). 1 each 0  . cephALEXin (KEFLEX) 500 MG capsule Take 1 capsule (500 mg total) by mouth 4 (four) times daily. 40 capsule 0  . cephALEXin (KEFLEX) 500 MG capsule Take 1 capsule (500 mg total) by mouth 3 (three) times daily. 30 capsule 0  . divalproex (DEPAKOTE ER) 500 MG 24 hr tablet Take 1 tablet (500 mg total) by mouth at bedtime. 30 tablet 11  . glucose blood test strip Test blood sugar daily. Dx E11.9 50 each 11  . insulin glargine (LANTUS) 100 UNIT/ML injection Inject 50 units into skin at bedtime 10 mL 1  . Insulin Pen Needle (PEN NEEDLES 3/16") 31G X 5 MM MISC Use to inject Lantus daily. 100 each 3  . levothyroxine (SYNTHROID, LEVOTHROID) 50 MCG tablet Take 1 tablet (50 mcg total) by mouth at bedtime. 30 tablet 11  . pregabalin (LYRICA) 75 MG capsule TAKE ONE CAPSULE BY MOUTH THREE DAILY. 90 capsule 3  . prochlorperazine (COMPAZINE) 10 MG tablet TAKE ONE TABLET BY MOUTH EVERY 6 HOURS AS NEEDED. 120 tablet 0  . ranitidine (ZANTAC) 150 MG tablet Take 150 mg by mouth daily.    . rivaroxaban (XARELTO) 15 MG TABS tablet Take 1 tablet (15 mg total) by mouth daily. 30 tablet 11  . silver sulfADIAZINE (SILVADENE) 1 % cream Apply 1 application topically daily. 50 g 1  . TRINTELLIX 20 MG TABS Take 20 mg by mouth at bedtime.    [name] UNABLE TO  FIND CPAP MACHINE with standard Aclaim nasal mask with humidifier. Set at 14 cwp 1 each 0  . VOLTAREN 1 % GEL APPLY 2 GRAMS TO EACH KNEE IN THE MORNING AND AT BEDTIME AND APPLY 1 GRAM TO EACH KNEE IN THE AFTERNOON. 300 g 5  . zolpidem (AMBIEN) 10 MG tablet Take 10-20 mg by mouth at bedtime as needed for sleep.      No current facility-administered medications on file prior to visit.    Allergies  Allergen Reactions  . Aspirin Swelling  . Ibuprofen Swelling  . Sustiva [Efavirenz] Rash and Swelling     And rash.  . Acetaminophen     States he was told by Nephrologist not to use Tylenol due to CKD.    . Nsaids Other (See Comments)    unknwn    Recent Results (from the past 2160 hour(s))  COMPLETE METABOLIC PANEL WITH GFR     Status: Abnormal   Collection Time: 09/06/15  9:23 AM  Result Value Ref Range   Sodium 141 135 - 146 mmol/L   Potassium 4.1 3.5 - 5.3 mmol/L   Chloride 106 98 - 110 mmol/L   CO2 27 20 - 31 mmol/L   Glucose, Bld 173 (H) 65 - 99 mg/dL   BUN 19 7 - 25 mg/dL   Creat 1.66 (H) 0.70 - 1.25 mg/dL    Comment:   For patients > or = 62 years of age: The upper reference limit for Creatinine is approximately 13% higher for people identified as African-American.      Total Bilirubin 0.8 0.2 - 1.2 mg/dL   Alkaline Phosphatase 89 40 - 115 U/L   AST 12 10 - 35 U/L   ALT 14 9 - 46 U/L   Total Protein 6.6 6.1 - 8.1 g/dL   Albumin 4.0 3.6 - 5.1 g/dL   Calcium 8.9 8.6 - 10.3 mg/dL   GFR, Est African American 50 (L) >=60 mL/min   GFR, Est Non African American 44 (L) >=60 mL/min  POCT CBC     Status: Abnormal   Collection Time: 09/06/15  9:46 AM  Result Value Ref Range   WBC 6.6 4.6 - 10.2 K/uL   Lymph, poc 2.5 0.6 - 3.4   POC LYMPH PERCENT 38.2 10 - 50 %L   MID (cbc) 0.8 0 - 0.9   POC MID % 11.6 0 - 12 %M   POC Granulocyte 3.3 2 - 6.9   Granulocyte percent 50.2 37 - 80 %G   RBC 4.71 4.69 - 6.13 M/uL   Hemoglobin 15.9 14.1 - 18.1 g/dL   HCT, POC 44.2 43.5 - 53.7 %   MCV 93.8 80 - 97 fL   MCH, POC 33.8 (A) 27 - 31.2 pg   MCHC 36.0 (A) 31.8 - 35.4 g/dL   RDW, POC 14.0 %   Platelet Count, POC 135 (A) 142 - 424 K/uL   MPV 6.4 0 - 99.8 fL  POCT glucose (manual entry)     Status: Abnormal   Collection Time: 09/06/15  9:47 AM  Result Value Ref Range   POC Glucose 177 (A) 70 - 99 mg/dl  POCT glycosylated hemoglobin (Hb A1C)     Status: None   Collection Time: 09/06/15  9:53 AM  Result Value Ref Range   Hemoglobin A1C 7.0   WOUND CULTURE     Status: None    Collection Time: 09/12/15 12:03 PM  Result Value Ref Range   Gram Stain Rare  Gram Stain WBC present-predominately PMN    Gram Stain No Squamous Epithelial Cells Seen    Gram Stain Moderate Gram Positive Cocci In Pairs    Gram Stain Rare Gram Negative Rods    Organism ID, Bacteria Multiple Organisms Present,None Predominant     Comment: No Staphylococcus aureus isolated NO GROUP A STREP (S. PYOGENES) ISOLATED   POCT CBC     Status: Abnormal   Collection Time: 09/12/15 12:09 PM  Result Value Ref Range   WBC 6.2 4.6 - 10.2 K/uL   Lymph, poc 2.0 0.6 - 3.4   POC LYMPH PERCENT 32.5 10 - 50 %L   MID (cbc) 0.6 0 - 0.9   POC MID % 9.3 0 - 12 %M   POC Granulocyte 3.6 2 - 6.9   Granulocyte percent 58.2 37 - 80 %G   RBC 4.73 4.69 - 6.13 M/uL   Hemoglobin 15.8 14.1 - 18.1 g/dL   HCT, POC 44.1 43.5 - 53.7 %   MCV 93.2 80 - 97 fL   MCH, POC 33.5 (A) 27 - 31.2 pg   MCHC 35.9 (A) 31.8 - 35.4 g/dL   RDW, POC 13.8 %   Platelet Count, POC 133 (A) 142 - 424 K/uL   MPV 6.4 0 - 99.8 fL  POCT glucose (manual entry)     Status: Abnormal   Collection Time: 09/12/15 12:12 PM  Result Value Ref Range   POC Glucose 414 (A) 70 - 99 mg/dl    Objective: Vitals:   11/07/15 1305  Weight: 240 lb (108.9 kg)  Height: 5' 10"  (1.778 m)    General: Patient is awake, alert, oriented x 3 and in no acute distress.  Dermatology: Skin is warm and dry bilateral with a Partial thickness ulceration present Plantar right hallux and 4th toe on right. Ulcerations measures <0.5cm There are mild scaly keratotic borders with a granular bases. The ulcerations does not probe to bone. There is no malodor, no active drainage, no acute erythema, no edema. No acute signs of infection. There is dry fissuring and ecchymosis with abrasions likely secondary to bandages at all toes. No other open lesions.    Vascular: Dorsalis Pedis pulse = 0/4 Bilateral,  Posterior Tibial pulse = 0/4 Bilateral,  Capillary Fill Time < 5 seconds. 1+  pitting edema and mild varicosities.  Neurologic: Protective sensation severely diminished to the level of the ankles with the 5.07/10g [employer].  Musculosketal: No Pain with palpation to ulcerated areas. No pain with compression to calves bilateral.  Assessment and Plan:  Problem List Items Addressed This Visit      Musculoskeletal and Integument   Toe ulcer, right ([place]) - Primary    Other Visit Diagnoses    Diminished pulses in lower extremity       Diabetic neuropathy with neurologic complication ([place])          -Examined patient and discussed the progression of the wounds and treatment alternatives. -Vascular ABI noncompressable at toes, larger vessels ABI normal 1.23 right 1.18 left - Cleansed ulcerations right foot -Applied Prisma AG and dry sterile dressing and instructed patient to continue with daily dressings at home consisting of the same with silvadene to the abrasions and bandaid/dry sterile dressing with care to avoid wrapping dressings to tightly around toes. -Continue with Keflex for preventive measures -Advised patient to go to the ER or return to office if the wound worsens or if constitutional symptoms are present. -Continue with postoperative shoe -Patient to return  to office in 2 weeks for follow up care and evaluation or sooner if problems arise.  Landis Martins, DPM

## 2015-11-12 ENCOUNTER — Encounter: Payer: Self-pay | Admitting: Emergency Medicine

## 2015-11-12 ENCOUNTER — Ambulatory Visit (INDEPENDENT_AMBULATORY_CARE_PROVIDER_SITE_OTHER): Payer: BLUE CROSS/BLUE SHIELD | Admitting: Emergency Medicine

## 2015-11-12 VITALS — BP 138/76 | HR 87 | Temp 98.3°F | Resp 16 | Ht 70.0 in | Wt 240.0 lb

## 2015-11-12 DIAGNOSIS — Z23 Encounter for immunization: Secondary | ICD-10-CM | POA: Diagnosis not present

## 2015-11-12 DIAGNOSIS — E1340 Other specified diabetes mellitus with diabetic neuropathy, unspecified: Secondary | ICD-10-CM | POA: Diagnosis not present

## 2015-11-12 DIAGNOSIS — R55 Syncope and collapse: Secondary | ICD-10-CM | POA: Diagnosis not present

## 2015-11-12 DIAGNOSIS — E0849 Diabetes mellitus due to underlying condition with other diabetic neurological complication: Secondary | ICD-10-CM

## 2015-11-12 DIAGNOSIS — R739 Hyperglycemia, unspecified: Secondary | ICD-10-CM | POA: Diagnosis not present

## 2015-11-12 LAB — GLUCOSE, POCT (MANUAL RESULT ENTRY): POC Glucose: 251 mg/dl — AB (ref 70–99)

## 2015-11-12 LAB — POCT CBC
Granulocyte percent: 62.2 %G (ref 37–80)
HEMATOCRIT: 45.3 % (ref 43.5–53.7)
HEMOGLOBIN: 16.4 g/dL (ref 14.1–18.1)
LYMPH, POC: 2.1 (ref 0.6–3.4)
MCH, POC: 33.9 pg — AB (ref 27–31.2)
MCHC: 36.3 g/dL — AB (ref 31.8–35.4)
MCV: 93.3 fL (ref 80–97)
MID (CBC): 0.6 (ref 0–0.9)
MPV: 6.2 fL (ref 0–99.8)
POC GRANULOCYTE: 4.4 (ref 2–6.9)
POC LYMPH %: 29.3 % (ref 10–50)
POC MID %: 8.5 % (ref 0–12)
Platelet Count, POC: 133 10*3/uL — AB (ref 142–424)
RBC: 4.85 M/uL (ref 4.69–6.13)
RDW, POC: 14.1 %
WBC: 7.1 10*3/uL (ref 4.6–10.2)

## 2015-11-12 LAB — BASIC METABOLIC PANEL WITH GFR
BUN: 19 mg/dL (ref 7–25)
CHLORIDE: 103 mmol/L (ref 98–110)
CO2: 27 mmol/L (ref 20–31)
CREATININE: 1.45 mg/dL — AB (ref 0.70–1.25)
Calcium: 8.7 mg/dL (ref 8.6–10.3)
GFR, EST AFRICAN AMERICAN: 59 mL/min — AB (ref >=60)
GFR, Est Non African American: 51 mL/min — ABNORMAL LOW (ref >=60)
Glucose, Bld: 255 mg/dL — ABNORMAL HIGH (ref 65–99)
Potassium: 4.1 mmol/L (ref 3.5–5.3)
SODIUM: 139 mmol/L (ref 135–146)

## 2015-11-12 LAB — POCT GLYCOSYLATED HEMOGLOBIN (HGB A1C): HEMOGLOBIN A1C: 8.2

## 2015-11-12 MED ORDER — INSULIN GLARGINE 100 UNIT/ML ~~LOC~~ SOLN: SUBCUTANEOUS | 11 refills | Status: DC

## 2015-11-12 NOTE — Progress Notes (Addendum)
Patient ID: Daniel [name], male   DOB: 05/25/1955, 60 y.o.   MRN: [id number]    By signing my name below, I, [name], attest that this documentation has been prepared under the direction and in the presence of [name], MD Electronically Signed: Ladene Artist, ED Scribe 11/12/2015 at 12:13 PM.  Chief Complaint:  Chief Complaint  Patient presents with  . Follow-up    diabetes check   . Immunizations    flu  . other    depression scale # 14   HPI: Daniel [name] is a 60 y.o. male, with a h/o DM and HIV, who reports to [facility] today for a reevaluation regarding DM. Pt states that he has been compliant with his medications but states that he does not check his blood glucose regularly, however, he reports checking his blood glucose once with a reading of 236. Pt is currently taking 50 units of Lantus nightly. He reports an ulcer to the bottom of his right great toe.    Depression  Pt states that his depression has never been under control and he still sleeps well into the afternoon. He does not sleep with a cpap machine. Pt has a h/o ADHD, anxiety and depression.   Chronic Kidney Disease  Pt reports a h/o chronic kidney disease. He saw his nephrologist last week but states that no lab work was done.   Cardiologist: Skeet Latch, MD.  He has an appointment with his eye doctor next week.   Past Medical History:  Diagnosis Date  . ADHD (attention deficit hyperactivity disorder)   . Anxiety   . Chronic kidney disease   . Clotting disorder ([place])   . Depression   . Diabetes mellitus without complication ([place])   . Diabetes mellitus, type II ([place])   . Dizziness 03/17/2015  . GERD (gastroesophageal reflux disease)   . HIV infection ([place])   . Liver disease   . OSA (obstructive sleep apnea) 07/25/2015   Uses CPAP regularly  . Ulcer    Past Surgical History:  Procedure Laterality Date  . SMALL INTESTINE SURGERY    . STOMACH SURGERY     Social History   Social History  .  Marital status: Single    Spouse name: N/A  . Number of children: N/A  . Years of education: N/A   Social History Main Topics  . Smoking status: Former Smoker    Packs/day: 0.10    Years: 10.00    Types: Cigars, Cigarettes    Quit date: 08/09/2014  . Smokeless tobacco: Never Used  . Alcohol use No  . Drug use: No  . Sexual activity: Not Currently    Partners: Male     Comment: pt. declined condoms   Other Topics Concern  . None   Social History Narrative   Epworth Sleepiness Scale = 7 (as of 03/16/2015)   Family History  Problem Relation Age of Onset  . Cancer Brother   . Depression Brother   . COPD Mother    Allergies  Allergen Reactions  . Aspirin Swelling  . Ibuprofen Swelling  . Sustiva [Efavirenz] Rash and Swelling    And rash.  . Acetaminophen     States he was told by Nephrologist not to use Tylenol due to CKD.    . Nsaids Other (See Comments)    unknwn   Prior to Admission medications   Medication Sig Start Date End Date Taking? Authorizing Provider  abacavir-dolutegravir-lamiVUDine (TRIUMEQ) 600-50-300 MG tablet Take  1 tablet by mouth daily. 09/03/15  Yes Thayer Headings, MD  acetaminophen (TYLENOL) 500 MG tablet Take 1,000 mg by mouth as needed.   Yes Historical Provider, MD  ALPRAZolam Duanne Moron) 1 MG tablet Take 1 mg by mouth 3 (three) times daily as needed for anxiety.    Yes Historical Provider, MD  amphetamine-dextroamphetamine (ADDERALL) 30 MG tablet Take 30 mg by mouth 3 (three) times daily.    Yes Historical Provider, MD  atorvastatin (LIPITOR) 10 MG tablet TAKE ONE TABLET BY MOUTH ONCE DAILY. 01/12/15  Yes Tishira R Brewington, PA-C  Blood Glucose Monitoring Suppl (BLOOD GLUCOSE METER KIT AND SUPPLIES) Dispense based on patient and insurance preference. Use up to four times daily as directed. (FOR ICD-9 250.00, 250.01). 09/08/13  Yes [name], MD  cephALEXin (KEFLEX) 500 MG capsule Take 1 capsule (500 mg total) by mouth 4 (four) times daily. 10/09/15   Yes Titorya Stover, DPM  cephALEXin (KEFLEX) 500 MG capsule Take 1 capsule (500 mg total) by mouth 3 (three) times daily. 11/02/15  Yes Landis Martins, DPM  divalproex (DEPAKOTE ER) 500 MG 24 hr tablet Take 1 tablet (500 mg total) by mouth at bedtime. 07/17/15  Yes Marcial Pacas, MD  glucose blood test strip Test blood sugar daily. Dx E11.9 01/10/15  Yes Tishira R Brewington, PA-C  insulin glargine (LANTUS) 100 UNIT/ML injection Inject 50 units into skin at bedtime 10/03/15  Yes [name], MD  Insulin Pen Needle (PEN NEEDLES 3/16") 31G X 5 MM MISC Use to inject Lantus daily. 03/30/15  Yes [name], MD  levothyroxine (SYNTHROID, LEVOTHROID) 50 MCG tablet Take 1 tablet (50 mcg total) by mouth at bedtime. 07/24/15  Yes [name], MD  pregabalin (LYRICA) 75 MG capsule TAKE ONE CAPSULE BY MOUTH THREE DAILY. 07/25/15  Yes [name], MD  prochlorperazine (COMPAZINE) 10 MG tablet TAKE ONE TABLET BY MOUTH EVERY 6 HOURS AS NEEDED. 11/23/14  Yes Thayer Headings, MD  ranitidine (ZANTAC) 150 MG tablet Take 150 mg by mouth daily.   Yes Historical Provider, MD  rivaroxaban (XARELTO) 15 MG TABS tablet Take 1 tablet (15 mg total) by mouth daily. 09/13/15  Yes [name], MD  silver sulfADIAZINE (SILVADENE) 1 % cream Apply 1 application topically daily. 11/02/15  Yes Titorya Stover, DPM  TRINTELLIX 20 MG TABS Take 20 mg by mouth at bedtime. 01/26/15  Yes Historical Provider, MD  UNABLE TO FIND CPAP MACHINE with standard Aclaim nasal mask with humidifier. Set at 14 cwp 04/12/13  Yes [name], MD  VOLTAREN 1 % GEL APPLY 2 GRAMS TO EACH KNEE IN THE MORNING AND AT BEDTIME AND APPLY 1 GRAM TO EACH KNEE IN THE AFTERNOON. 02/23/15  Yes [name], MD  zolpidem (AMBIEN) 10 MG tablet Take 10-20 mg by mouth at bedtime as needed for sleep.    Yes Historical Provider, MD    ROS: The patient denies fevers, chills, night sweats, unintentional weight loss, chest pain, palpitations, wheezing, dyspnea on exertion, nausea,  vomiting, abdominal pain, dysuria, hematuria, melena, numbness, weakness, or tingling.   All other systems have been reviewed and were otherwise negative with the exception of those mentioned in the HPI and as above.    PHYSICAL EXAM: Vitals:   11/12/15 1148  BP: 138/76  Pulse: 87  Resp: 16  Temp: 98.3 F (36.8 C)   Body mass index is 34.44 kg/m.   General: Alert, no acute distress. Somewhat depressed but cooperative  HEENT:  Normocephalic, atraumatic, oropharynx patent. Eye: Juliette Mangle [facility] Cardiovascular:  Regular rate and rhythm, no rubs murmurs or gallops.  No Carotid bruits, radial pulse intact. No pedal edema.  Respiratory: Clear to auscultation bilaterally.  No wheezes, rales, or rhonchi.  No cyanosis, no use of accessory musculature Abdominal: No organomegaly, abdomen is soft and non-tender, positive bowel sounds.  No masses. Musculoskeletal: Gait intact. No edema, tenderness. Skin: No rashes. Ulcerations of the R 4th toe and beneath the R 5th toe Neurologic: Facial musculature symmetric. Psychiatric: Patient acts appropriately throughout our interaction. Lymphatic: No cervical or submandibular lymphadenopathy  LABS: Results for orders placed or performed in visit on 11/12/15  POCT glucose (manual entry)  Result Value Ref Range   POC Glucose 251 (A) 70 - 99 mg/dl  POCT glycosylated hemoglobin (Hb A1C)  Result Value Ref Range   Hemoglobin A1C 8.2   POCT CBC  Result Value Ref Range   WBC 7.1 4.6 - 10.2 K/uL   Lymph, poc 2.1 0.6 - 3.4   POC LYMPH PERCENT 29.3 10 - 50 %L   MID (cbc) 0.6 0 - 0.9   POC MID % 8.5 0 - 12 %M   POC Granulocyte 4.4 2 - 6.9   Granulocyte percent 62.2 37 - 80 %G   RBC 4.85 4.69 - 6.13 M/uL   Hemoglobin 16.4 14.1 - 18.1 g/dL   HCT, POC 45.3 43.5 - 53.7 %   MCV 93.3 80 - 97 fL   MCH, POC 33.9 (A) 27 - 31.2 pg   MCHC 36.3 (A) 31.8 - 35.4 g/dL   RDW, POC 14.1 %   Platelet Count, POC 133 (A) 142 - 424 K/uL   MPV 6.2 0 - 99.8 fL     EKG/XRAY:   Primary read interpreted by Dr. Everlene Farrier at [facility].   ASSESSMENT/PLAN: I increased his Lantus to 60 units. I encouraged him to check his sugar every day and write it down. I told him the only way we would be able to get control of his diabetes would be to know what his sugars are doing. He has an ongoing infection in his right second and fourth toes. I'm sure this is contributing to his difficulty in controlling his sugar. He is not adherent to his diet. He is also unable to exercise because of the problem with his feet.I personally performed the services described in this documentation, which was scribed in my presence. The recorded information has been reviewed and is accurate. Depression is a huge issue. He continues to be depressed but not suicidal. He works with Dr. Toy Care.    Gross sideeffects, risk and benefits, and alternatives of medications d/w patient. Patient is aware that all medications have potential sideeffects and we are unable to predict every sideeffect or drug-drug interaction that may occur.  Arlyss Queen MD 11/12/2015 12:12 PM

## 2015-11-12 NOTE — Patient Instructions (Addendum)
Please check your sugar once a day and record. Please increase your Lantus insulin to 60 units at night. Recheck 3 months.  IF you received an x-ray today, you will receive an invoice from [facility]. Please contact [facility] at [phone] with questions or concerns regarding your invoice.   IF you received labwork today, you will receive an invoice from [employer]. Please contact [facility] at [phone] with questions or concerns regarding your invoice.   Our billing staff will not be able to assist you with questions regarding bills from these companies.  You will be contacted with the lab results as soon as they are available. The fastest way to get your results is to activate your My Chart account. Instructions are located on the last page of this paperwork. If you have not heard from [name] regarding the results in 2 weeks, please contact this office.    Influenza (Flu) Vaccine (Inactivated or Recombinant):  1. Why get vaccinated? Influenza ("flu") is a contagious disease that spreads around the [place] every year, usually between October and May. Flu is caused by influenza viruses, and is spread mainly by coughing, sneezing, and close contact. Anyone can get flu. Flu strikes suddenly and can last several days. Symptoms vary by age, but can include:  fever/chills  sore throat  muscle aches  fatigue  cough  headache  runny or stuffy nose Flu can also lead to pneumonia and blood infections, and cause diarrhea and seizures in children. If you have a medical condition, such as heart or lung disease, flu can make it worse. Flu is more dangerous for some people. Infants and young children, people 66 years of age and older, pregnant women, and people with certain health conditions or a weakened immune system are at greatest risk. Each year thousands of people in the [place] die from flu, and many more are  hospitalized. Flu vaccine can:  keep you from getting flu,  make flu less severe if you do get it, and  keep you from spreading flu to your family and other people. 2. Inactivated and recombinant flu vaccines A dose of flu vaccine is recommended every flu season. Children 6 months through 4 years of age may need two doses during the same flu season. Everyone else needs only one dose each flu season. Some inactivated flu vaccines contain a very small amount of a mercury-based preservative called thimerosal. Studies have not shown thimerosal in vaccines to be harmful, but flu vaccines that do not contain thimerosal are available. There is no live flu virus in flu shots. They cannot cause the flu. There are many flu viruses, and they are always changing. Each year a new flu vaccine is made to protect against three or four viruses that are likely to cause disease in the upcoming flu season. But even when the vaccine doesn't exactly match these viruses, it may still provide some protection. Flu vaccine cannot prevent:  flu that is caused by a virus not covered by the vaccine, or  illnesses that look like flu but are not. It takes about 2 weeks for protection to develop after vaccination, and protection lasts through the flu season. 3. Some people should not get this vaccine Tell the person who is giving you the vaccine:  If you have any severe, life-threatening allergies. If you ever had a life-threatening allergic reaction after a dose of flu vaccine, or have a severe allergy to any part of this vaccine, you may  be advised not to get vaccinated. Most, but not all, types of flu vaccine contain a small amount of egg protein.  If you ever had Guillain-Barre Syndrome (also called GBS). Some people with a history of GBS should not get this vaccine. This should be discussed with your doctor.  If you are not feeling well. It is usually okay to get flu vaccine when you have a mild illness, but you  might be asked to come back when you feel better. 4. Risks of a vaccine reaction With any medicine, including vaccines, there is a chance of reactions. These are usually mild and go away on their own, but serious reactions are also possible. Most people who get a flu shot do not have any problems with it. Minor problems following a flu shot include:  soreness, redness, or swelling where the shot was given  hoarseness  sore, red or itchy eyes  cough  fever  aches  headache  itching  fatigue If these problems occur, they usually begin soon after the shot and last 1 or 2 days. More serious problems following a flu shot can include the following:  There may be a small increased risk of Guillain-Barre Syndrome (GBS) after inactivated flu vaccine. This risk has been estimated at 1 or 2 additional cases per million people vaccinated. This is much lower than the risk of severe complications from flu, which can be prevented by flu vaccine.  Young children who get the flu shot along with pneumococcal vaccine (PCV13) and/or DTaP vaccine at the same time might be slightly more likely to have a seizure caused by fever. Ask your doctor for more information. Tell your doctor if a child who is getting flu vaccine has ever had a seizure. Problems that could happen after any injected vaccine:  People sometimes faint after a medical procedure, including vaccination. Sitting or lying down for about 15 minutes can help prevent fainting, and injuries caused by a fall. Tell your doctor if you feel dizzy, or have vision changes or ringing in the ears.  Some people get severe pain in the shoulder and have difficulty moving the arm where a shot was given. This happens very rarely.  Any medication can cause a severe allergic reaction. Such reactions from a vaccine are very rare, estimated at about 1 in a million doses, and would happen within a few minutes to a few hours after the vaccination. As with any  medicine, there is a very remote chance of a vaccine causing a serious injury or death. The safety of vaccines is always being monitored. For more information, visit: http://www.aguilar.org/ 5. What if there is a serious reaction? What should I look for?  Look for anything that concerns you, such as signs of a severe allergic reaction, very high fever, or unusual behavior. Signs of a severe allergic reaction can include hives, swelling of the face and throat, difficulty breathing, a fast heartbeat, dizziness, and weakness. These would start a few minutes to a few hours after the vaccination. What should I do?  If you think it is a severe allergic reaction or other emergency that can't wait, call 9-1-1 and get the person to the nearest hospital. Otherwise, call your doctor.  Reactions should be reported to the Vaccine Adverse Event Reporting System (VAERS). Your doctor should file this report, or you can do it yourself through the VAERS web site at www.vaers.SamedayNews.es, or by calling 915-741-2160. VAERS does not give medical advice. 6. The National Vaccine Injury Compensation  Program The [employer] Injury Compensation Program (VICP) is a federal program that was created to compensate people who may have been injured by certain vaccines. Persons who believe they may have been injured by a vaccine can learn about the program and about filing a claim by calling 915-431-2926 or visiting the [place] website at GoldCloset.com.ee. There is a time limit to file a claim for compensation. 7. How can I learn more?  Ask your healthcare provider. He or she can give you the vaccine package insert or suggest other sources of information.  Call your local or state health department.  Contact the Centers for Disease Control and Prevention (CDC):  Call 318-499-9575 (1-800-CDC-INFO) or  Visit CDC's website at https://gibson.com/ Vaccine Information Statement Inactivated Influenza Vaccine  (09/30/2013)   This information is not intended to replace advice given to you by your health care provider. Make sure you discuss any questions you have with your health care provider.   Document Released: 12/05/2005 Document Revised: 03/03/2014 Document Reviewed: 10/03/2013 Elsevier Interactive Patient Education [employer].

## 2015-11-15 ENCOUNTER — Other Ambulatory Visit: Payer: Self-pay | Admitting: Sports Medicine

## 2015-11-15 DIAGNOSIS — E1140 Type 2 diabetes mellitus with diabetic neuropathy, unspecified: Secondary | ICD-10-CM

## 2015-11-15 DIAGNOSIS — L03031 Cellulitis of right toe: Secondary | ICD-10-CM

## 2015-11-15 DIAGNOSIS — L02611 Cutaneous abscess of right foot: Secondary | ICD-10-CM

## 2015-11-15 DIAGNOSIS — L97511 Non-pressure chronic ulcer of other part of right foot limited to breakdown of skin: Secondary | ICD-10-CM

## 2015-11-15 DIAGNOSIS — E1149 Type 2 diabetes mellitus with other diabetic neurological complication: Secondary | ICD-10-CM

## 2015-11-15 DIAGNOSIS — R0989 Other specified symptoms and signs involving the circulatory and respiratory systems: Secondary | ICD-10-CM

## 2015-11-16 ENCOUNTER — Other Ambulatory Visit: Payer: Self-pay | Admitting: Emergency Medicine

## 2015-11-19 NOTE — Telephone Encounter (Signed)
Faxed

## 2015-11-21 ENCOUNTER — Ambulatory Visit: Payer: BLUE CROSS/BLUE SHIELD | Admitting: Sports Medicine

## 2015-11-21 ENCOUNTER — Encounter: Payer: Self-pay | Admitting: Sports Medicine

## 2015-11-21 ENCOUNTER — Ambulatory Visit (INDEPENDENT_AMBULATORY_CARE_PROVIDER_SITE_OTHER): Payer: BLUE CROSS/BLUE SHIELD | Admitting: Sports Medicine

## 2015-11-21 VITALS — BP 116/71 | HR 79 | Temp 98.4°F | Resp 16

## 2015-11-21 DIAGNOSIS — I96 Gangrene, not elsewhere classified: Secondary | ICD-10-CM

## 2015-11-21 DIAGNOSIS — L97511 Non-pressure chronic ulcer of other part of right foot limited to breakdown of skin: Secondary | ICD-10-CM

## 2015-11-21 DIAGNOSIS — R0989 Other specified symptoms and signs involving the circulatory and respiratory systems: Secondary | ICD-10-CM

## 2015-11-21 DIAGNOSIS — I739 Peripheral vascular disease, unspecified: Secondary | ICD-10-CM

## 2015-11-21 DIAGNOSIS — E1149 Type 2 diabetes mellitus with other diabetic neurological complication: Secondary | ICD-10-CM

## 2015-11-21 DIAGNOSIS — S90424A Blister (nonthermal), right lesser toe(s), initial encounter: Secondary | ICD-10-CM

## 2015-11-21 DIAGNOSIS — E1140 Type 2 diabetes mellitus with diabetic neuropathy, unspecified: Secondary | ICD-10-CM

## 2015-11-21 MED ORDER — AMOXICILLIN-POT CLAVULANATE 875-125 MG PO TABS: 1.0000 | ORAL_TABLET | Freq: Two times a day (BID) | ORAL | 1 refills | Status: DC

## 2015-11-21 NOTE — Patient Instructions (Signed)
Betadine dressing to toes daily

## 2015-11-21 NOTE — Progress Notes (Addendum)
Subjective: Daniel [name] is a 60 y.o. male patient seen in office for evaluation of ulceration of the right hallux and 4th toe; reports has been dressing with antibiotic(silvadene) cream daily. Finished Keflex on friday. Patient has a history of diabetes and a blood glucose level checked in office of 495.  Denies nausea/fever/vomiting/chills/night sweats/shortness of breath/pain however reports that he feels like "trash". Patient has no other pedal complaints at this time.  Patient Active Problem List   Diagnosis Date Noted  . Toe ulcer, right ([place]) 09/19/2015  . Decreased pedal pulses 09/19/2015  . OSA on CPAP 09/05/2015  . Major depressive disorder, recurrent episode, moderate ([place]) 09/05/2015  . OSA (obstructive sleep apnea) 07/25/2015  . Passed out 06/28/2015  . Low back pain 06/11/2015  . Abnormality of gait 06/11/2015  . Dizziness 03/17/2015  . Weakness 02/21/2015  . Chronic renal insufficiency, stage III (moderate) 08/09/2014  . Diabetes ([place]) 11/07/2013  . Hematuria 06/21/2013  . Hepatic steatosis 09/09/2010  . UTI 09/15/2006  . PYURIA 09/03/2006  . Human immunodeficiency virus (HIV) disease ([place]) 06/04/2006  . HERPES ZOSTER, UNCOMPLICATED 39/76/7341  . HSV 06/04/2006  . Depression 06/04/2006  . DISORDER, ATTENTION DEFICIT W/HYPERACTIVITY 06/04/2006  . THROMBOPHLEBITIS NOS 06/04/2006  . GERD 06/04/2006  . ARTHRITIS, HAND 06/04/2006  . HYPERGLYCEMIA, HX OF 06/04/2006  . HEPATITIS B, HX OF 06/04/2006   Current Outpatient Prescriptions on File Prior to Visit  Medication Sig Dispense Refill  . abacavir-dolutegravir-lamiVUDine (TRIUMEQ) 600-50-300 MG tablet Take 1 tablet by mouth daily. 90 tablet 1  . acetaminophen (TYLENOL) 500 MG tablet Take 1,000 mg by mouth as needed.    . ALPRAZolam (XANAX) 1 MG tablet Take 1 mg by mouth 3 (three) times daily as needed for anxiety.     [name] amphetamine-dextroamphetamine (ADDERALL) 30 MG tablet Take 30 mg by mouth 3 (three) times daily.      [name] atorvastatin (LIPITOR) 10 MG tablet TAKE ONE TABLET BY MOUTH ONCE DAILY. 90 tablet 11  . Blood Glucose Monitoring Suppl (BLOOD GLUCOSE METER KIT AND SUPPLIES) Dispense based on patient and insurance preference. Use up to four times daily as directed. (FOR ICD-9 250.00, 250.01). 1 each 0  . cephALEXin (KEFLEX) 500 MG capsule Take 1 capsule (500 mg total) by mouth 3 (three) times daily. 30 capsule 0  . cephALEXin (KEFLEX) 500 MG capsule TAKE ONE CAPSULE BY MOUTH 3 TIMES A DAY. 30 capsule 0  . divalproex (DEPAKOTE ER) 500 MG 24 hr tablet Take 1 tablet (500 mg total) by mouth at bedtime. 30 tablet 11  . glucose blood test strip Test blood sugar daily. Dx E11.9 50 each 11  . insulin glargine (LANTUS) 100 UNIT/ML injection Inject 60 units into skin at bedtime 15 mL 11  . Insulin Pen Needle (PEN NEEDLES 3/16") 31G X 5 MM MISC Use to inject Lantus daily. 100 each 3  . levothyroxine (SYNTHROID, LEVOTHROID) 50 MCG tablet Take 1 tablet (50 mcg total) by mouth at bedtime. 30 tablet 11  . LYRICA 75 MG capsule TAKE ONE CAPSULE BY MOUTH THREE TIMES DAILY. 90 capsule 0  . prochlorperazine (COMPAZINE) 10 MG tablet TAKE ONE TABLET BY MOUTH EVERY 6 HOURS AS NEEDED. 120 tablet 0  . ranitidine (ZANTAC) 150 MG tablet Take 150 mg by mouth daily.    . rivaroxaban (XARELTO) 15 MG TABS tablet Take 1 tablet (15 mg total) by mouth daily. 30 tablet 11  . silver sulfADIAZINE (SILVADENE) 1 % cream Apply 1 application topically daily. 50 g  1  . TRINTELLIX 20 MG TABS Take 20 mg by mouth at bedtime.    [name] UNABLE TO FIND CPAP MACHINE with standard [employer] nasal mask with humidifier. Set at 14 cwp 1 each 0  . VOLTAREN 1 % GEL APPLY 2 GRAMS TO EACH KNEE IN THE MORNING AND AT BEDTIME AND APPLY 1 GRAM TO EACH KNEE IN THE AFTERNOON. 300 g 5  . zolpidem (AMBIEN) 10 MG tablet Take 10-20 mg by mouth at bedtime as needed for sleep.      No current facility-administered medications on file prior to visit.    Allergies  Allergen  Reactions  . Aspirin Swelling  . Ibuprofen Swelling  . Sustiva [Efavirenz] Rash and Swelling    And rash.  . Acetaminophen     States he was told by Nephrologist not to use Tylenol due to CKD.    . Nsaids Other (See Comments)    unknwn    Recent Results (from the past 2160 hour(s))  COMPLETE METABOLIC PANEL WITH GFR     Status: Abnormal   Collection Time: 09/06/15  9:23 AM  Result Value Ref Range   Sodium 141 135 - 146 mmol/L   Potassium 4.1 3.5 - 5.3 mmol/L   Chloride 106 98 - 110 mmol/L   CO2 27 20 - 31 mmol/L   Glucose, Bld 173 (H) 65 - 99 mg/dL   BUN 19 7 - 25 mg/dL   Creat 1.66 (H) 0.70 - 1.25 mg/dL    Comment:   For patients > or = 62 years of age: The upper reference limit for Creatinine is approximately 13% higher for people identified as African-American.      Total Bilirubin 0.8 0.2 - 1.2 mg/dL   Alkaline Phosphatase 89 40 - 115 U/L   AST 12 10 - 35 U/L   ALT 14 9 - 46 U/L   Total Protein 6.6 6.1 - 8.1 g/dL   Albumin 4.0 3.6 - 5.1 g/dL   Calcium 8.9 8.6 - 10.3 mg/dL   GFR, Est African American 50 (L) >=60 mL/min   GFR, Est Non African American 44 (L) >=60 mL/min  POCT CBC     Status: Abnormal   Collection Time: 09/06/15  9:46 AM  Result Value Ref Range   WBC 6.6 4.6 - 10.2 K/uL   Lymph, poc 2.5 0.6 - 3.4   POC LYMPH PERCENT 38.2 10 - 50 %L   MID (cbc) 0.8 0 - 0.9   POC MID % 11.6 0 - 12 %M   POC Granulocyte 3.3 2 - 6.9   Granulocyte percent 50.2 37 - 80 %G   RBC 4.71 4.69 - 6.13 M/uL   Hemoglobin 15.9 14.1 - 18.1 g/dL   HCT, POC 44.2 43.5 - 53.7 %   MCV 93.8 80 - 97 fL   MCH, POC 33.8 (A) 27 - 31.2 pg   MCHC 36.0 (A) 31.8 - 35.4 g/dL   RDW, POC 14.0 %   Platelet Count, POC 135 (A) 142 - 424 K/uL   MPV 6.4 0 - 99.8 fL  POCT glucose (manual entry)     Status: Abnormal   Collection Time: 09/06/15  9:47 AM  Result Value Ref Range   POC Glucose 177 (A) 70 - 99 mg/dl  POCT glycosylated hemoglobin (Hb A1C)     Status: None   Collection Time: 09/06/15   9:53 AM  Result Value Ref Range   Hemoglobin A1C 7.0   WOUND CULTURE  Status: None   Collection Time: 09/12/15 12:03 PM  Result Value Ref Range   Gram Stain Rare    Gram Stain WBC present-predominately PMN    Gram Stain No Squamous Epithelial Cells Seen    Gram Stain Moderate Gram Positive Cocci In Pairs    Gram Stain Rare Gram Negative Rods    Organism ID, Bacteria Multiple Organisms Present,None Predominant     Comment: No Staphylococcus aureus isolated NO GROUP A STREP (S. PYOGENES) ISOLATED   POCT CBC     Status: Abnormal   Collection Time: 09/12/15 12:09 PM  Result Value Ref Range   WBC 6.2 4.6 - 10.2 K/uL   Lymph, poc 2.0 0.6 - 3.4   POC LYMPH PERCENT 32.5 10 - 50 %L   MID (cbc) 0.6 0 - 0.9   POC MID % 9.3 0 - 12 %M   POC Granulocyte 3.6 2 - 6.9   Granulocyte percent 58.2 37 - 80 %G   RBC 4.73 4.69 - 6.13 M/uL   Hemoglobin 15.8 14.1 - 18.1 g/dL   HCT, POC 44.1 43.5 - 53.7 %   MCV 93.2 80 - 97 fL   MCH, POC 33.5 (A) 27 - 31.2 pg   MCHC 35.9 (A) 31.8 - 35.4 g/dL   RDW, POC 13.8 %   Platelet Count, POC 133 (A) 142 - 424 K/uL   MPV 6.4 0 - 99.8 fL  POCT glucose (manual entry)     Status: Abnormal   Collection Time: 09/12/15 12:12 PM  Result Value Ref Range   POC Glucose 414 (A) 70 - 99 mg/dl  BASIC METABOLIC PANEL WITH GFR     Status: Abnormal   Collection Time: 11/12/15 12:27 PM  Result Value Ref Range   Sodium 139 135 - 146 mmol/L   Potassium 4.1 3.5 - 5.3 mmol/L   Chloride 103 98 - 110 mmol/L   CO2 27 20 - 31 mmol/L   Glucose, Bld 255 (H) 65 - 99 mg/dL   BUN 19 7 - 25 mg/dL   Creat 1.45 (H) 0.70 - 1.25 mg/dL    Comment:   For patients > or = 63 years of age: The upper reference limit for Creatinine is approximately 13% higher for people identified as African-American.      Calcium 8.7 8.6 - 10.3 mg/dL   GFR, Est African American 59 (L) >=60 mL/min   GFR, Est Non African American 51 (L) >=60 mL/min  POCT glucose (manual entry)     Status: Abnormal    Collection Time: 11/12/15 12:41 PM  Result Value Ref Range   POC Glucose 251 (A) 70 - 99 mg/dl  POCT CBC     Status: Abnormal   Collection Time: 11/12/15 12:42 PM  Result Value Ref Range   WBC 7.1 4.6 - 10.2 K/uL   Lymph, poc 2.1 0.6 - 3.4   POC LYMPH PERCENT 29.3 10 - 50 %L   MID (cbc) 0.6 0 - 0.9   POC MID % 8.5 0 - 12 %M   POC Granulocyte 4.4 2 - 6.9   Granulocyte percent 62.2 37 - 80 %G   RBC 4.85 4.69 - 6.13 M/uL   Hemoglobin 16.4 14.1 - 18.1 g/dL   HCT, POC 45.3 43.5 - 53.7 %   MCV 93.3 80 - 97 fL   MCH, POC 33.9 (A) 27 - 31.2 pg   MCHC 36.3 (A) 31.8 - 35.4 g/dL   RDW, POC 14.1 %   Platelet Count, POC  133 (A) 142 - 424 K/uL   MPV 6.2 0 - 99.8 fL  POCT glycosylated hemoglobin (Hb A1C)     Status: None   Collection Time: 11/12/15 12:47 PM  Result Value Ref Range   Hemoglobin A1C 8.2     Objective: Blood pressure 116/71, pulse 79, temperature 98.4 F (36.9 C), temperature source Oral, resp. rate 16.  General: Patient is awake, alert, oriented x 3 and in no acute distress.  Dermatology: Skin is warm and dry bilateral with a Partial thickness ulceration present Plantar right hallux and 4th toe with new ulcerations at 2nd and 3rd toes and a new blister at right 5th toe with early necrosis and dry gangrenous changes to the toes on right foot. Ulcerations measures <0.5cm There are mixed fibro-granular tissue present with surrounding eschar and necrosis. The ulcerations do not probe to bone however close to bone on 4th toe right foot. There is no malodor, no active drainage, + focal erythema, + focal edema. No other acute signs of infection. No other open lesions.    Vascular: Dorsalis Pedis pulse = +4/4 on right, left 0/4,  Posterior Tibial pulse = 0/4 Bilateral,  Capillary Fill Time < 5 seconds. 1+ pitting edema and mild varicosities.  Neurologic: Protective sensation severely diminished to the level of the ankles with the 5.07/10g [employer].  Musculosketal:  No Pain with palpation to ulcerated areas. No pain with compression to calves bilateral.  Assessment and Plan:  Problem List Items Addressed This Visit      Musculoskeletal and Integument   Toe ulcer, right ([name]) - Primary   Relevant Medications   amoxicillin-clavulanate (AUGMENTIN) 875-125 MG tablet    Other Visit Diagnoses    Diminished pulses in lower extremity       Relevant Medications   amoxicillin-clavulanate (AUGMENTIN) 875-125 MG tablet   Diabetic neuropathy with neurologic complication ([name])       Relevant Medications   amoxicillin-clavulanate (AUGMENTIN) 875-125 MG tablet   PAD (peripheral artery disease) ([name])       Relevant Medications   amoxicillin-clavulanate (AUGMENTIN) 875-125 MG tablet   Blister of toe, right, initial encounter       Relevant Medications   amoxicillin-clavulanate (AUGMENTIN) 875-125 MG tablet   Gangrene ([name])       Relevant Medications   amoxicillin-clavulanate (AUGMENTIN) 875-125 MG tablet      -Examined patient and discussed the progression of the wounds and treatment alternatives. -Vascular studies again reviewed ABI noncompressable at toes, larger vessels ABI normal 1.23 right 1.18 left; Will communicate with cardiologist/vascular surgeon regarding this case. I am concerned of ischemic gangrene developing to toes and patient should be assess for possible intervention and is at great risk of losing his right foot if skin and ulceration changes continue to worsen - Cleansed ulcerations right foot -Applied betadine and dry sterile dressing and instructed patient to continue with daily dressings at home consisting of the same covered with dry sterile dressing with care to avoid wrapping dressings to tightly around toes. -Rx Augmentin  -Advised patient to go to the ER for elevated glucose and over all "bad feeling" that he is currently experiencing; patient expressed understanding and states that he will go to ER -Continue with postoperative  shoe -Patient to return to office in 1 week for follow up care and evaluation or sooner if problems arise.  Landis Martins, DPM

## 2015-11-23 ENCOUNTER — Ambulatory Visit: Payer: BLUE CROSS/BLUE SHIELD

## 2015-11-26 ENCOUNTER — Encounter (HOSPITAL_COMMUNITY): Payer: Self-pay | Admitting: Emergency Medicine

## 2015-11-26 ENCOUNTER — Ambulatory Visit (INDEPENDENT_AMBULATORY_CARE_PROVIDER_SITE_OTHER): Payer: BLUE CROSS/BLUE SHIELD | Admitting: Family Medicine

## 2015-11-26 VITALS — BP 132/80 | HR 104 | Temp 98.7°F | Resp 17 | Ht 71.0 in | Wt 241.0 lb

## 2015-11-26 DIAGNOSIS — R2681 Unsteadiness on feet: Secondary | ICD-10-CM | POA: Diagnosis not present

## 2015-11-26 DIAGNOSIS — W1830XA Fall on same level, unspecified, initial encounter: Secondary | ICD-10-CM | POA: Diagnosis not present

## 2015-11-26 DIAGNOSIS — R739 Hyperglycemia, unspecified: Secondary | ICD-10-CM | POA: Diagnosis not present

## 2015-11-26 DIAGNOSIS — E1122 Type 2 diabetes mellitus with diabetic chronic kidney disease: Secondary | ICD-10-CM | POA: Insufficient documentation

## 2015-11-26 DIAGNOSIS — R42 Dizziness and giddiness: Secondary | ICD-10-CM

## 2015-11-26 DIAGNOSIS — Z79899 Other long term (current) drug therapy: Secondary | ICD-10-CM | POA: Insufficient documentation

## 2015-11-26 DIAGNOSIS — F909 Attention-deficit hyperactivity disorder, unspecified type: Secondary | ICD-10-CM | POA: Diagnosis not present

## 2015-11-26 DIAGNOSIS — N183 Chronic kidney disease, stage 3 (moderate): Secondary | ICD-10-CM | POA: Diagnosis not present

## 2015-11-26 DIAGNOSIS — Y999 Unspecified external cause status: Secondary | ICD-10-CM | POA: Diagnosis not present

## 2015-11-26 DIAGNOSIS — Y92009 Unspecified place in unspecified non-institutional (private) residence as the place of occurrence of the external cause: Secondary | ICD-10-CM | POA: Diagnosis not present

## 2015-11-26 DIAGNOSIS — Z87891 Personal history of nicotine dependence: Secondary | ICD-10-CM | POA: Insufficient documentation

## 2015-11-26 DIAGNOSIS — Y939 Activity, unspecified: Secondary | ICD-10-CM | POA: Diagnosis not present

## 2015-11-26 DIAGNOSIS — R296 Repeated falls: Secondary | ICD-10-CM | POA: Diagnosis not present

## 2015-11-26 DIAGNOSIS — Z7901 Long term (current) use of anticoagulants: Secondary | ICD-10-CM | POA: Diagnosis not present

## 2015-11-26 LAB — POCT CBC
Granulocyte percent: 67.4 %G (ref 37–80)
HCT, POC: 43.5 % (ref 43.5–53.7)
Hemoglobin: 15.7 g/dL (ref 14.1–18.1)
LYMPH, POC: 2 (ref 0.6–3.4)
MCH: 33.2 pg — AB (ref 27–31.2)
MCHC: 36 g/dL — AB (ref 31.8–35.4)
MCV: 92.1 fL (ref 80–97)
MID (CBC): 0.5 (ref 0–0.9)
MPV: 6.2 fL (ref 0–99.8)
POC Granulocyte: 5.3 (ref 2–6.9)
POC LYMPH %: 25.9 % (ref 10–50)
POC MID %: 6.7 % (ref 0–12)
Platelet Count, POC: 161 10*3/uL (ref 142–424)
RBC: 4.72 M/uL (ref 4.69–6.13)
RDW, POC: 13.6 %
WBC: 7.8 10*3/uL (ref 4.6–10.2)

## 2015-11-26 LAB — COMPREHENSIVE METABOLIC PANEL
ALK PHOS: 99 U/L (ref 38–126)
ALT: 21 U/L (ref 17–63)
ANION GAP: 8 (ref 5–15)
AST: 23 U/L (ref 15–41)
Albumin: 3.7 g/dL (ref 3.5–5.0)
BILIRUBIN TOTAL: 0.6 mg/dL (ref 0.3–1.2)
BUN: 20 mg/dL (ref 6–20)
CALCIUM: 9.3 mg/dL (ref 8.9–10.3)
CO2: 25 mmol/L (ref 22–32)
Chloride: 106 mmol/L (ref 101–111)
Creatinine, Ser: 1.55 mg/dL — ABNORMAL HIGH (ref 0.61–1.24)
GFR calc Af Amer: 54 mL/min — ABNORMAL LOW (ref >60)
GFR calc non Af Amer: 46 mL/min — ABNORMAL LOW (ref >60)
GLUCOSE: 190 mg/dL — AB (ref 65–99)
Potassium: 3.9 mmol/L (ref 3.5–5.1)
Sodium: 139 mmol/L (ref 135–145)
Total Protein: 7.3 g/dL (ref 6.5–8.1)

## 2015-11-26 LAB — CBC WITH DIFFERENTIAL/PLATELET
Basophils Absolute: 0 10*3/uL (ref 0.0–0.1)
Basophils Relative: 0 %
Eosinophils Absolute: 0.3 10*3/uL (ref 0.0–0.7)
Eosinophils Relative: 3 %
HEMATOCRIT: 46.2 % (ref 39.0–52.0)
HEMOGLOBIN: 16.2 g/dL (ref 13.0–17.0)
LYMPHS ABS: 2.3 10*3/uL (ref 0.7–4.0)
LYMPHS PCT: 28 %
MCH: 32.9 pg (ref 26.0–34.0)
MCHC: 35.1 g/dL (ref 30.0–36.0)
MCV: 93.7 fL (ref 78.0–100.0)
Monocytes Absolute: 0.7 10*3/uL (ref 0.1–1.0)
Monocytes Relative: 8 %
NEUTROS ABS: 5 10*3/uL (ref 1.7–7.7)
NEUTROS PCT: 61 %
Platelets: 144 10*3/uL — ABNORMAL LOW (ref 150–400)
RBC: 4.93 MIL/uL (ref 4.22–5.81)
RDW: 13.6 % (ref 11.5–15.5)
WBC: 8.3 10*3/uL (ref 4.0–10.5)

## 2015-11-26 LAB — URINE MICROSCOPIC-ADD ON

## 2015-11-26 LAB — URINALYSIS, ROUTINE W REFLEX MICROSCOPIC
Bilirubin Urine: NEGATIVE
KETONES UR: NEGATIVE mg/dL
LEUKOCYTES UA: NEGATIVE
Nitrite: NEGATIVE
PH: 6.5 (ref 5.0–8.0)
Protein, ur: 100 mg/dL — AB
Specific Gravity, Urine: 1.035 — ABNORMAL HIGH (ref 1.005–1.030)

## 2015-11-26 LAB — GLUCOSE, POCT (MANUAL RESULT ENTRY): POC Glucose: 238 mg/dl — AB (ref 70–99)

## 2015-11-26 NOTE — Progress Notes (Signed)
By signing my name below, I, [name], attest that this documentation has been prepared under the direction and in the presence of [name], MD.  Electronically Signed: [name], Medical Scribe. 11/26/15. 5:08 PM.  Subjective:    Patient ID: Daniel [name], male    DOB: November 24, 1955, 60 y.o.   MRN: [id number]  HPI Chief Complaint  Patient presents with  . Dizziness    HPI Comments: Daniel [name] is a 60 y.o. male who presents to the [facility] for possible vertigo. Pt has multiple medicle problems; DM, ADHD, HIV, chronic renal disease, and gait abnormality by problem list. Last seen here by Dr. Everlene Farrier Sept 18th. A1c 8.2,  lantus was inc to 60 units. He was having an on going infection in his toes at that time. Under the care of podiatry [name] and followed by Dr. Wylene Simmer for depression. He was seen in Aug by [name], neurology. He was noting lightheadedness with sudden position changes in May and falls per previous visit. When he was seen in Aug, he was still feeling his lower extremity giving out and orthostatic dizziness. He was continued on depakote for sudden migraines. His EMG did show pripheral neuropathy. He did have cardiac telemetry in June, indicating sinus rhythm and tachycardia during episode of syncope. He initially left to return for later visit.  Pt fell over 5x in an hour while bending over to pick stuff up in the yard.  Pt falls when he stands up he'll occasionally goes to the left or to the right. Pt felt the same symptoms this morning, and reports almost falling in the shower but caught himself. Pt reports being "cockeyed", "off balance" without focal weakness for the past 3 days. Pt talked to [facility] and he had vertigo earlier this year and they stated this didn't seem like vertigo. Pt's blood sugar has been around 134-244 during these syncopal episodes. Pt's blood sugar was 480 last week, and he is compliant with his medication. Pt increased his  lantus to 80 units 3 days ago. Pt hasn't been checking his blood pressure. Pt had signs of a TIA but no stroke. Pt lives with his brother, and he didn't drive here. Pt denies HA, trouble speaking, facial asymmetry, blurry vision, LOC, hitting his head, nausea, CP, chest tightness, and SOB  Lab Results  Component Value Date   HGBA1C 8.2 11/12/2015   Lab Results  Component Value Date   MICROALBUR 107.5 (H) 08/01/2014   BP Readings from Last 3 Encounters:  11/26/15 132/80  11/21/15 116/71  11/12/15 138/76   Lab Results  Component Value Date   CREATININE 1.45 (H) 11/12/2015   Patient Active Problem List   Diagnosis Date Noted  . Toe ulcer, right ([place]) 09/19/2015  . Decreased pedal pulses 09/19/2015  . OSA on CPAP 09/05/2015  . Major depressive disorder, recurrent episode, moderate ([place]) 09/05/2015  . OSA (obstructive sleep apnea) 07/25/2015  . Passed out 06/28/2015  . Low back pain 06/11/2015  . Abnormality of gait 06/11/2015  . Dizziness 03/17/2015  . Weakness 02/21/2015  . Chronic renal insufficiency, stage III (moderate) 08/09/2014  . Diabetes ([place]) 11/07/2013  . Hematuria 06/21/2013  . Hepatic steatosis 09/09/2010  . UTI 09/15/2006  . PYURIA 09/03/2006  . Human immunodeficiency virus (HIV) disease ([place]) 06/04/2006  . HERPES ZOSTER, UNCOMPLICATED 49/67/5916  . HSV 06/04/2006  . Depression 06/04/2006  . DISORDER, ATTENTION DEFICIT W/HYPERACTIVITY 06/04/2006  . THROMBOPHLEBITIS NOS 06/04/2006  . GERD  06/04/2006  . ARTHRITIS, HAND 06/04/2006  . HYPERGLYCEMIA, HX OF 06/04/2006  . HEPATITIS B, HX OF 06/04/2006   Past Medical History:  Diagnosis Date  . ADHD (attention deficit hyperactivity disorder)   . Anxiety   . Chronic kidney disease   . Clotting disorder ([place])   . Depression   . Diabetes mellitus without complication ([place])   . Diabetes mellitus, type II ([place])   . Dizziness 03/17/2015  . GERD (gastroesophageal reflux disease)   . HIV infection ([place])   . Liver  disease   . OSA (obstructive sleep apnea) 07/25/2015   Uses CPAP regularly  . Ulcer [facility])    Past Surgical History:  Procedure Laterality Date  . SMALL INTESTINE SURGERY    . STOMACH SURGERY     Allergies  Allergen Reactions  . Aspirin Swelling  . Ibuprofen Swelling  . Sustiva [Efavirenz] Rash and Swelling    And rash.  . Acetaminophen     States he was told by Nephrologist not to use Tylenol due to CKD.    . Nsaids Other (See Comments)    unknwn   Prior to Admission medications   Medication Sig Start Date End Date Taking? Authorizing Provider  abacavir-dolutegravir-lamiVUDine (TRIUMEQ) 600-50-300 MG tablet Take 1 tablet by mouth daily. 09/03/15  Yes Thayer Headings, MD  acetaminophen (TYLENOL) 500 MG tablet Take 1,000 mg by mouth as needed.   Yes Historical Provider, MD  ALPRAZolam Duanne Moron) 1 MG tablet Take 1 mg by mouth 3 (three) times daily as needed for anxiety.    Yes Historical Provider, MD  amoxicillin-clavulanate (AUGMENTIN) 875-125 MG tablet Take 1 tablet by mouth 2 (two) times daily. 11/21/15  Yes Titorya Stover, DPM  amphetamine-dextroamphetamine (ADDERALL) 30 MG tablet Take 30 mg by mouth 3 (three) times daily.    Yes Historical Provider, MD  atorvastatin (LIPITOR) 10 MG tablet TAKE ONE TABLET BY MOUTH ONCE DAILY. 01/12/15  Yes Tishira R Brewington, PA-C  Blood Glucose Monitoring Suppl (BLOOD GLUCOSE METER KIT AND SUPPLIES) Dispense based on patient and insurance preference. Use up to four times daily as directed. (FOR ICD-9 250.00, 250.01). 09/08/13  Yes [name], MD  cephALEXin (KEFLEX) 500 MG capsule Take 1 capsule (500 mg total) by mouth 3 (three) times daily. 11/02/15  Yes Titorya Stover, DPM  cephALEXin (KEFLEX) 500 MG capsule TAKE ONE CAPSULE BY MOUTH 3 TIMES A DAY. 11/15/15  Yes Titorya Stover, DPM  divalproex (DEPAKOTE ER) 500 MG 24 hr tablet Take 1 tablet (500 mg total) by mouth at bedtime. 07/17/15  Yes Marcial Pacas, MD  glucose blood test strip Test blood sugar daily.  Dx E11.9 01/10/15  Yes Tishira R Brewington, PA-C  insulin glargine (LANTUS) 100 UNIT/ML injection Inject 60 units into skin at bedtime 11/12/15  Yes [name], MD  Insulin Pen Needle (PEN NEEDLES 3/16") 31G X 5 MM MISC Use to inject Lantus daily. 03/30/15  Yes [name], MD  levothyroxine (SYNTHROID, LEVOTHROID) 50 MCG tablet Take 1 tablet (50 mcg total) by mouth at bedtime. 07/24/15  Yes [name], MD  LYRICA 75 MG capsule TAKE ONE CAPSULE BY MOUTH THREE TIMES DAILY. 11/19/15  Yes [name], MD  prochlorperazine (COMPAZINE) 10 MG tablet TAKE ONE TABLET BY MOUTH EVERY 6 HOURS AS NEEDED. 11/23/14  Yes Thayer Headings, MD  ranitidine (ZANTAC) 150 MG tablet Take 150 mg by mouth daily.   Yes Historical Provider, MD  rivaroxaban (XARELTO) 15 MG TABS tablet Take 1  tablet (15 mg total) by mouth daily. 09/13/15  Yes [name], MD  silver sulfADIAZINE (SILVADENE) 1 % cream Apply 1 application topically daily. 11/02/15  Yes Titorya Stover, DPM  TRINTELLIX 20 MG TABS Take 20 mg by mouth at bedtime. 01/26/15  Yes Historical Provider, MD  UNABLE TO FIND CPAP MACHINE with standard [employer] nasal mask with humidifier. Set at 14 cwp 04/12/13  Yes [name], MD  VOLTAREN 1 % GEL APPLY 2 GRAMS TO EACH KNEE IN THE MORNING AND AT BEDTIME AND APPLY 1 GRAM TO EACH KNEE IN THE AFTERNOON. 02/23/15  Yes [name], MD  zolpidem (AMBIEN) 10 MG tablet Take 10-20 mg by mouth at bedtime as needed for sleep.    Yes Historical Provider, MD   Social History   Social History  . Marital status: Single    Spouse name: N/A  . Number of children: N/A  . Years of education: N/A   Occupational History  . Not on file.   Social History Main Topics  . Smoking status: Former Smoker    Packs/day: 0.10    Years: 10.00    Types: Cigars, Cigarettes    Quit date: 08/09/2014  . Smokeless tobacco: Never Used  . Alcohol use No  . Drug use: No  . Sexual activity: Not Currently    Partners: Male     Comment: pt.  declined condoms   Other Topics Concern  . Not on file   Social History Narrative   Epworth Sleepiness Scale = 7 (as of 03/16/2015)   Depression screen [facility] 2/9 11/26/2015 11/12/2015 09/12/2015 09/06/2015 07/11/2015  Decreased Interest 0 3 0 0 3  Down, Depressed, Hopeless 0 3 0 0 3  PHQ - 2 Score 0 6 0 0 6  Altered sleeping - 1 - - 3  Tired, decreased energy - 1 - - 3  Change in appetite - 3 - - 0  Feeling bad or failure about yourself  - 3 - - -  Trouble concentrating - 0 - - 3  Moving slowly or fidgety/restless - 0 - - 1  Suicidal thoughts - 0 - - 0  PHQ-9 Score - 14 - - 16  Difficult doing work/chores - Somewhat difficult - - -  Some recent data might be hidden   Review of Systems  Eyes: Negative for visual disturbance.  Respiratory: Negative for chest tightness and shortness of breath.   Cardiovascular: Negative for chest pain.  Gastrointestinal: Negative for nausea.  Neurological: Positive for dizziness and light-headedness. Negative for syncope, facial asymmetry, speech difficulty, weakness and headaches.    Objective:  Physical Exam  Constitutional: He appears well-developed and well-nourished. No distress.  HENT:  Head: Normocephalic and atraumatic.  Eyes: Conjunctivae and EOM are normal. Pupils are equal, round, and reactive to light.  Neck: Neck supple.  Cardiovascular: Regular rhythm.  Tachycardia present.  Exam reveals no friction rub.   No murmur heard. No carotid burits  Pulmonary/Chest: Effort normal and breath sounds normal. No respiratory distress. He has no wheezes. He has no rales.  Neurological: He is alert.  No air leaking with puffing out cheeks Unsteady with standing but corrects Unsteady with Romberg  Skin: Skin is warm and dry.  Psychiatric: He has a normal mood and affect. His behavior is normal.  Nursing note and vitals reviewed.  BP 132/80 (BP Location: Right Arm, Patient Position: Sitting, Cuff Size: Large)   Pulse (!) 104   Temp 98.7 F (37.1  C) (  Oral)   Resp 17   Ht 5' 11"  (1.803 m)   Wt 241 lb (109.3 kg)   SpO2 96%   BMI 33.61 kg/m   Orthostatic VS for the past 24 hrs (Last 3 readings):  BP- Lying Pulse- Lying BP- Sitting Pulse- Sitting BP- Standing at 0 minutes Pulse- Standing at 0 minutes  11/26/15 1801 119/78 88 124/80 90 122/79 93    Results for orders placed or performed in visit on 11/26/15  POCT CBC  Result Value Ref Range   WBC 7.8 4.6 - 10.2 K/uL   Lymph, poc 2.0 0.6 - 3.4   POC LYMPH PERCENT 25.9 10 - 50 %L   MID (cbc) 0.5 0 - 0.9   POC MID % 6.7 0 - 12 %M   POC Granulocyte 5.3 2 - 6.9   Granulocyte percent 67.4 37 - 80 %G   RBC 4.72 4.69 - 6.13 M/uL   Hemoglobin 15.7 14.1 - 18.1 g/dL   HCT, POC 43.5 43.5 - 53.7 %   MCV 92.1 80 - 97 fL   MCH, POC 33.2 (A) 27 - 31.2 pg   MCHC 36.0 (A) 31.8 - 35.4 g/dL   RDW, POC 13.6 %   Platelet Count, POC 161 142 - 424 K/uL   MPV 6.2 0 - 99.8 fL  POCT glucose (manual entry)  Result Value Ref Range   POC Glucose 238 (A) 70 - 99 mg/dl   EKG Reading: Sinus  Rhythm. Rate 89. No acute findings   Assessment & Plan:    Daniel [name] is a 60 y.o. male Falls frequently - Plan: Orthostatic vital signs  Unsteadiness - Plan: POCT CBC, Orthostatic vital signs  Hyperglycemia - Plan: POCT glucose (manual entry)  Dizziness - Plan: POCT CBC, EKG 12-Lead, Orthostatic vital signs  60 year old male with multiple medical problems, including uncontrolled diabetes, history of falls in the past, now with multiple recent falls, and unsteadiness with gait past 3 days. Initial falls few days ago with leaning forward, without other vertigo type symptoms. Denies any chest pain, dyspnea, palpitations, or any focal weakness. Now feels unsteady with his gait for the past 3 days without other new neurologic symptoms. Nonfocal neuro exam other than unsteadiness with Romberg testing. He does have a slight wide-based gait when walking to the waiting room, but does not appear to drift.  Hyperglycemia noted on blood work here, but he is been running from the 200s to 400s at home. Not orthostatic on evaluation here, no acute findings on EKG, no sign of anemia.  - Recommended further evaluation to the emergency room for possible neuroimaging/rule out TIA or neurologic cause. Also may need electrolyte testing to rule out hyponatremia, and check status of renal function with history of chronic kidney disease.   -As symptoms have been persistent for the past 3 days, did agree to allow him to go by private vehicle as he was not a driver. Triage/first nurse at [facility] ER was advised.  No orders of the defined types were placed in this encounter.  Patient Instructions   Unfortunately I do not have a source for your instability or dizziness symptoms here in the office.  With the frequent falls you have been having as well as unsteadiness today, you need further evaluation through the emergency room. After leaving our office, go directly to [facility] emergency room. There will be notes in the system on what we have done here in the office today. If follow-up needed after  the emergency room, can return here based on their instructions.    IF you received an x-ray today, you will receive an invoice from [facility] Radiology. Please contact [facility] Radiology at [phone] with questions or concerns regarding your invoice.   IF you received labwork today, you will receive an invoice from [employer]. Please contact [facility] at [phone] with questions or concerns regarding your invoice.   Our billing staff will not be able to assist you with questions regarding bills from these companies.  You will be contacted with the lab results as soon as they are available. The fastest way to get your results is to activate your My Chart account. Instructions are located on the last page of this paperwork. If you have not heard from [name] regarding the results  in 2 weeks, please contact this office.        I personally performed the services described in this documentation, which was scribed in my presence. The recorded information has been reviewed and considered, and addended by me as needed.   Signed,   [name], MD [facility].  11/26/15 6:21 PM

## 2015-11-26 NOTE — Patient Instructions (Addendum)
Unfortunately I do not have a source for your instability or dizziness symptoms here in the office.  With the frequent falls you have been having as well as unsteadiness today, you need further evaluation through the emergency room. After leaving our office, go directly to [facility] emergency room. There will be notes in the system on what we have done here in the office today. If follow-up needed after the emergency room, can return here based on their instructions.    IF you received an x-ray today, you will receive an invoice from [facility] Radiology. Please contact [facility] Radiology at [phone] with questions or concerns regarding your invoice.   IF you received labwork today, you will receive an invoice from [employer]. Please contact [facility] at [phone] with questions or concerns regarding your invoice.   Our billing staff will not be able to assist you with questions regarding bills from these companies.  You will be contacted with the lab results as soon as they are available. The fastest way to get your results is to activate your My Chart account. Instructions are located on the last page of this paperwork. If you have not heard from [name] regarding the results in 2 weeks, please contact this office.

## 2015-11-26 NOTE — ED Triage Notes (Signed)
Pt. fell at home last night and today twice with fatigue , generalized weakness/dizziness and unsteady gait . Alert and oriented , speech clear with no facial asymmetry, equal strong grips .

## 2015-11-27 ENCOUNTER — Emergency Department (HOSPITAL_COMMUNITY)
Admission: EM | Admit: 2015-11-27 | Discharge: 2015-11-27 | Disposition: A | Discharge status: Home / Self Care | Payer: BLUE CROSS/BLUE SHIELD | Attending: Emergency Medicine | Admitting: Emergency Medicine

## 2015-11-27 ENCOUNTER — Encounter: Payer: Self-pay | Admitting: Sports Medicine

## 2015-11-27 ENCOUNTER — Ambulatory Visit (INDEPENDENT_AMBULATORY_CARE_PROVIDER_SITE_OTHER): Payer: BLUE CROSS/BLUE SHIELD | Admitting: Sports Medicine

## 2015-11-27 ENCOUNTER — Emergency Department (HOSPITAL_COMMUNITY): Payer: BLUE CROSS/BLUE SHIELD

## 2015-11-27 VITALS — BP 145/78 | HR 89 | Temp 99.3°F | Resp 20

## 2015-11-27 DIAGNOSIS — I739 Peripheral vascular disease, unspecified: Secondary | ICD-10-CM

## 2015-11-27 DIAGNOSIS — I96 Gangrene, not elsewhere classified: Secondary | ICD-10-CM | POA: Diagnosis not present

## 2015-11-27 DIAGNOSIS — E1149 Type 2 diabetes mellitus with other diabetic neurological complication: Secondary | ICD-10-CM

## 2015-11-27 DIAGNOSIS — L97511 Non-pressure chronic ulcer of other part of right foot limited to breakdown of skin: Secondary | ICD-10-CM | POA: Diagnosis not present

## 2015-11-27 DIAGNOSIS — E1140 Type 2 diabetes mellitus with diabetic neuropathy, unspecified: Secondary | ICD-10-CM

## 2015-11-27 DIAGNOSIS — Y92009 Unspecified place in unspecified non-institutional (private) residence as the place of occurrence of the external cause: Secondary | ICD-10-CM

## 2015-11-27 DIAGNOSIS — W19XXXA Unspecified fall, initial encounter: Secondary | ICD-10-CM

## 2015-11-27 DIAGNOSIS — S90424A Blister (nonthermal), right lesser toe(s), initial encounter: Secondary | ICD-10-CM

## 2015-11-27 DIAGNOSIS — R0989 Other specified symptoms and signs involving the circulatory and respiratory systems: Secondary | ICD-10-CM

## 2015-11-27 HISTORY — DX: Human immunodeficiency virus (HIV) disease: B20

## 2015-11-27 MED ORDER — AMOXICILLIN-POT CLAVULANATE 875-125 MG PO TABS: 1.0000 | ORAL_TABLET | Freq: Two times a day (BID) | ORAL | 1 refills | Status: DC

## 2015-11-27 MED ORDER — SODIUM CHLORIDE 0.9 % IV BOLUS (SEPSIS)
1000.0000 mL | Freq: Once | INTRAVENOUS | Status: AC
  Administered 2015-11-27: 1000 mL via INTRAVENOUS

## 2015-11-27 MED ORDER — AMOXICILLIN-POT CLAVULANATE 875-125 MG PO TABS
1.0000 | ORAL_TABLET | Freq: Once | ORAL | Status: AC
  Administered 2015-11-27: 1 via ORAL
  Filled 2015-11-27: qty 1

## 2015-11-27 MED ORDER — DIVALPROEX SODIUM 250 MG PO DR TAB
500.0000 mg | DELAYED_RELEASE_TABLET | Freq: Once | ORAL | Status: AC
  Administered 2015-11-27: 500 mg via ORAL
  Filled 2015-11-27: qty 2

## 2015-11-27 MED ORDER — VORTIOXETINE HBR 20 MG PO TABS
20.0000 mg | ORAL_TABLET | Freq: Once | ORAL | Status: DC
  Filled 2015-11-27: qty 20

## 2015-11-27 MED ORDER — LEVOTHYROXINE SODIUM 50 MCG PO TABS
50.0000 ug | ORAL_TABLET | Freq: Once | ORAL | Status: AC
  Administered 2015-11-27: 50 ug via ORAL
  Filled 2015-11-27: qty 1

## 2015-11-27 MED ORDER — AMPHETAMINE-DEXTROAMPHETAMINE 10 MG PO TABS
30.0000 mg | ORAL_TABLET | Freq: Every day | ORAL | Status: DC
  Administered 2015-11-27: 30 mg via ORAL

## 2015-11-27 MED ORDER — AMPHETAMINE-DEXTROAMPHETAMINE 10 MG PO TABS: 30.0000 mg | ORAL_TABLET | Freq: Three times a day (TID) | ORAL | Status: DC

## 2015-11-27 MED ORDER — INSULIN GLARGINE 100 UNIT/ML ~~LOC~~ SOLN
60.0000 [IU] | Freq: Once | SUBCUTANEOUS | Status: AC
  Administered 2015-11-27: 60 [IU] via SUBCUTANEOUS
  Filled 2015-11-27: qty 0.6

## 2015-11-27 MED ORDER — AMPHETAMINE-DEXTROAMPHETAMINE 10 MG PO TABS
30.0000 mg | ORAL_TABLET | Freq: Three times a day (TID) | ORAL | Status: DC
  Filled 2015-11-27: qty 3

## 2015-11-27 MED ORDER — ABACAVIR-DOLUTEGRAVIR-LAMIVUD 600-50-300 MG PO TABS
1.0000 | ORAL_TABLET | Freq: Every day | ORAL | Status: DC
  Administered 2015-11-27: 1 via ORAL
  Filled 2015-11-27: qty 1

## 2015-11-27 MED ORDER — PREGABALIN 75 MG PO CAPS
75.0000 mg | ORAL_CAPSULE | Freq: Once | ORAL | Status: AC
  Administered 2015-11-27: 75 mg via ORAL
  Filled 2015-11-27: qty 1

## 2015-11-27 NOTE — ED Notes (Signed)
Pt now requesting adderall and HIV medications

## 2015-11-27 NOTE — ED Notes (Signed)
Pt ambulatory to the lobby with steady gait noted; pt denies dizziness and lightheadedness

## 2015-11-27 NOTE — ED Notes (Signed)
Pt c/o frequent falls within last week; hx of vertigo but suspects something other than vertigo- denies dizziness; pt also has open wound to RIGHT foot which he is being followed by [facility] for- dressing in place

## 2015-11-27 NOTE — ED Provider Notes (Signed)
[facility] DEPT Provider Note   CSN: [id number] Arrival date & time: 11/26/15  1845     History   Chief Complaint Chief Complaint  Patient presents with  . Fall    HPI Daniel [name] is a 60 y.o. male.  Patient presents with complaint of frequent falling over the past week. He denies pre-fall dizziness or chest pain. He usually does not know he's above to fall until it happens. It has happened when getting up from lying, but not consistently. He reports being in the yard doing some work and falling over multiple times while bending over yesterday. He denies injury from any specific fall. No headache, unilateral weakness, visual changes, nausea or vomiting. He has had vertigo in the past but denies "room-spinning" dizziness associated with symptoms. No pain currently. He is currently being treated for a foot ulcer on the right big toe. He denies any changes in condition, pain, fever, or drainage associated with foot infection.   The history is provided by the patient. No language interpreter was used.    Past Medical History:  Diagnosis Date  . ADHD (attention deficit hyperactivity disorder)   . Anxiety   . Chronic kidney disease   . Clotting disorder ([place])   . Depression   . Diabetes mellitus without complication ([place])   . Diabetes mellitus, type II ([place])   . Dizziness 03/17/2015  . GERD (gastroesophageal reflux disease)   . HIV disease ([place])   . HIV infection ([place])   . Liver disease   . OSA (obstructive sleep apnea) 07/25/2015   Uses CPAP regularly  . Ulcer [facility])     Patient Active Problem List   Diagnosis Date Noted  . Toe ulcer, right ([place]) 09/19/2015  . Decreased pedal pulses 09/19/2015  . OSA on CPAP 09/05/2015  . Major depressive disorder, recurrent episode, moderate ([place]) 09/05/2015  . OSA (obstructive sleep apnea) 07/25/2015  . Passed out 06/28/2015  . Low back pain 06/11/2015  . Abnormality of gait 06/11/2015  . Dizziness 03/17/2015  . Weakness  02/21/2015  . Chronic renal insufficiency, stage III (moderate) 08/09/2014  . Diabetes ([place]) 11/07/2013  . Hematuria 06/21/2013  . Hepatic steatosis 09/09/2010  . UTI 09/15/2006  . PYURIA 09/03/2006  . Human immunodeficiency virus (HIV) disease ([place]) 06/04/2006  . HERPES ZOSTER, UNCOMPLICATED 94/17/4081  . HSV 06/04/2006  . Depression 06/04/2006  . DISORDER, ATTENTION DEFICIT W/HYPERACTIVITY 06/04/2006  . THROMBOPHLEBITIS NOS 06/04/2006  . GERD 06/04/2006  . ARTHRITIS, HAND 06/04/2006  . HYPERGLYCEMIA, HX OF 06/04/2006  . HEPATITIS B, HX OF 06/04/2006    Past Surgical History:  Procedure Laterality Date  . SMALL INTESTINE SURGERY    . STOMACH SURGERY         Home Medications    Prior to Admission medications   Medication Sig Start Date End Date Taking? Authorizing Provider  abacavir-dolutegravir-lamiVUDine (TRIUMEQ) 600-50-300 MG tablet Take 1 tablet by mouth daily. 09/03/15  Yes Thayer Headings, MD  acetaminophen (TYLENOL) 500 MG tablet Take 1,000 mg by mouth every 8 (eight) hours as needed for moderate pain.    Yes Historical Provider, MD  ALPRAZolam Duanne Moron) 1 MG tablet Take 1 mg by mouth 3 (three) times daily as needed for anxiety.    Yes Historical Provider, MD  amoxicillin-clavulanate (AUGMENTIN) 875-125 MG tablet Take 1 tablet by mouth 2 (two) times daily. 11/21/15  Yes Titorya Stover, DPM  amphetamine-dextroamphetamine (ADDERALL) 30 MG tablet Take 30 mg by mouth 3 (three) times daily.  Yes Historical Provider, MD  atorvastatin (LIPITOR) 10 MG tablet TAKE ONE TABLET BY MOUTH ONCE DAILY. 01/12/15  Yes Tishira R Brewington, PA-C  Blood Glucose Monitoring Suppl (BLOOD GLUCOSE METER KIT AND SUPPLIES) Dispense based on patient and insurance preference. Use up to four times daily as directed. (FOR ICD-9 250.00, 250.01). 09/08/13  Yes [name], MD  divalproex (DEPAKOTE ER) 500 MG 24 hr tablet Take 1 tablet (500 mg total) by mouth at bedtime. 07/17/15  Yes Marcial Pacas, MD    glucose blood test strip Test blood sugar daily. Dx E11.9 01/10/15  Yes Tishira R Brewington, PA-C  insulin glargine (LANTUS) 100 UNIT/ML injection Inject 60 units into skin at bedtime Patient taking differently: Inject 80 Units into the skin at bedtime.  11/12/15  Yes [name], MD  Insulin Pen Needle (PEN NEEDLES 3/16") 31G X 5 MM MISC Use to inject Lantus daily. 03/30/15  Yes [name], MD  levothyroxine (SYNTHROID, LEVOTHROID) 50 MCG tablet Take 1 tablet (50 mcg total) by mouth at bedtime. 07/24/15  Yes [name], MD  LYRICA 75 MG capsule TAKE ONE CAPSULE BY MOUTH THREE TIMES DAILY. 11/19/15  Yes [name], MD  prochlorperazine (COMPAZINE) 10 MG tablet TAKE ONE TABLET BY MOUTH EVERY 6 HOURS AS NEEDED. Patient taking differently: TAKE ONE TABLET BY MOUTH EVERY 6 HOURS AS NEEDED FOR NAUSEA 11/23/14  Yes Thayer Headings, MD  ranitidine (ZANTAC) 150 MG tablet Take 150 mg by mouth daily.   Yes Historical Provider, MD  rivaroxaban (XARELTO) 15 MG TABS tablet Take 1 tablet (15 mg total) by mouth daily. 09/13/15  Yes [name], MD  TRINTELLIX 20 MG TABS Take 20 mg by mouth at bedtime. 01/26/15  Yes Historical Provider, MD  UNABLE TO FIND CPAP MACHINE with standard [employer] nasal mask with humidifier. Set at 14 cwp 04/12/13  Yes [name], MD  VOLTAREN 1 % GEL APPLY 2 GRAMS TO EACH KNEE IN THE MORNING AND AT BEDTIME AND APPLY 1 GRAM TO EACH KNEE IN THE AFTERNOON. 02/23/15  Yes [name], MD  zolpidem (AMBIEN) 10 MG tablet Take 10-20 mg by mouth at bedtime as needed for sleep.    Yes Historical Provider, MD    Family History Family History  Problem Relation Age of Onset  . Cancer Brother   . Depression Brother   . COPD Mother     Social History Social History  Substance Use Topics  . Smoking status: Former Smoker    Packs/day: 0.10    Years: 10.00    Types: Cigars, Cigarettes    Quit date: 08/09/2014  . Smokeless tobacco: Never Used  . Alcohol use No     Allergies    Aspirin; Ibuprofen; Sustiva [efavirenz]; Acetaminophen; and Nsaids   Review of Systems Review of Systems  Constitutional: Negative for chills and fever.  HENT: Negative.   Respiratory: Negative.   Cardiovascular: Negative.   Gastrointestinal: Negative.   Musculoskeletal: Negative.   Skin: Negative.        Has right big toe infection, under treatment.  Neurological: Negative.      Physical Exam Updated Vital Signs BP 128/85   Pulse 75   Temp 98.4 F (36.9 C)   Resp 18   Ht _0  (1.803 m)   Wt 109.3 kg   SpO2 99%   BMI 33.61 kg/m   Physical Exam  Constitutional: He is oriented to person, place, and time. He appears well-developed and  well-nourished.  HENT:  Head: Normocephalic and atraumatic.  Eyes: Pupils are equal, round, and reactive to light.  Neck: Normal range of motion. Neck supple.  Cardiovascular: Normal rate and regular rhythm.   No murmur heard. Pulmonary/Chest: Effort normal and breath sounds normal. He has no wheezes. He has no rales.  Abdominal: Soft. Bowel sounds are normal. There is no tenderness. There is no rebound and no guarding.  Musculoskeletal: Normal range of motion.  Neurological: He is alert and oriented to person, place, and time.  Patient has no deficits of coordination. CN's 3-12 grossly intact. Speech clear, focused and oriented. He is ambulatory without ataxia.   Skin: Skin is warm and dry. No rash noted.  Psychiatric: He has a normal mood and affect.     ED Treatments / Results  Labs (all labs ordered are listed, but only abnormal results are displayed) Labs Reviewed  CBC WITH DIFFERENTIAL/PLATELET - Abnormal; Notable for the following:       Result Value   Platelets 144 (*)    All other components within normal limits  COMPREHENSIVE METABOLIC PANEL - Abnormal; Notable for the following:    Glucose, Bld 190 (*)    Creatinine, Ser 1.55 (*)    GFR calc non Af Amer 46 (*)    GFR calc Af Amer 54 (*)    All other components  within normal limits  URINALYSIS, ROUTINE W REFLEX MICROSCOPIC (NOT AT [facility]) - Abnormal; Notable for the following:    Specific Gravity, Urine 1.035 (*)    Glucose, UA >1000 (*)    Hgb urine dipstick TRACE (*)    Protein, ur 100 (*)    All other components within normal limits  URINE MICROSCOPIC-ADD ON - Abnormal; Notable for the following:    Squamous Epithelial / LPF 0-5 (*)    Bacteria, UA FEW (*)    Crystals CA OXALATE CRYSTALS (*)    All other components within normal limits    EKG  EKG Interpretation None       Radiology Ct Head Wo Contrast  Result Date: 11/27/2015 CLINICAL DATA:  60 year old male with fall EXAM: CT HEAD WITHOUT CONTRAST TECHNIQUE: Contiguous axial images were obtained from the base of the skull through the vertex without intravenous contrast. COMPARISON:  Brain MRI dated 04/1915 FINDINGS: Brain: The ventricles and sulci appropriate in size for patient's age. Minimal periventricular and deep white matter chronic microvascular ischemic changes. There is no acute intracranial hemorrhage. No mass effect or midline shift. No extra-axial fluid collection. Vascular: No hyperdense vessel or unexpected calcification. Skull: Normal. Negative for fracture or focal lesion. Sinuses/Orbits: No acute finding. Other: None IMPRESSION: No acute intracranial pathology. Electronically Signed   By: Anner Crete M.D.   On: 11/27/2015 02:52    Procedures Procedures (including critical care time)  Medications Ordered in ED Medications  sodium chloride 0.9 % bolus 1,000 mL (0 mLs Intravenous Stopped 11/27/15 0332)     Initial Impression / Assessment and Plan / ED Course  I have reviewed the triage vital signs and the nursing notes.  Pertinent labs & imaging results that were available during my care of the patient were reviewed by me and considered in my medical decision making (see chart for details).  Clinical Course    Patient presents with complaint of multiple falls  over the last week. No injuries reported. He is currently at baseline, is ambulatory. No neurologic abnormalities.   Head CT negative, labs largely unremarkable. Renal insufficiency at baseline. Urine  appears concentrated. IVF's provided. He is seen by Dr. Kathrynn Humble and is felt stable for discharge with neurology referral.  Final Clinical Impressions(s) / ED Diagnoses   Final diagnoses:  None  1. fall  New Prescriptions New Prescriptions   No medications on file     Charlann Lange, PA-C 11/28/15 0003    Varney Biles, MD 11/28/15 2309

## 2015-11-27 NOTE — Discharge Instructions (Signed)
FOLLOW UP WITH YOUR DOCTOR AS NEEDED, AND MAKE AN APPOINTMENT WITH THE NEUROLOGIST FOR FURTHER EVALUATION OF FREQUENT FALLING. RETURN TO THE EMERGENCY DEPARTMENT AS NEEDED FOR WORSENING SYMPTOMS OR NEW CONCERNS.

## 2015-11-27 NOTE — ED Notes (Addendum)
Pt ambulates around nurses station without difficulty; steady gait noted; pt denies dizziness or lightheadedness

## 2015-11-27 NOTE — Progress Notes (Signed)
Subjective: Daniel [name] is a 60 y.o. male patient seen in office for evaluation of ulceration of the right toe ulcerations has been using betadine and taking antibiotics. Patient has a history of diabetes and a blood glucose level of 232.  Denies nausea/fever/vomiting/chills/night sweats/shortness of breath. Reports he was sent by pcp to hospital for dizziness had testing done which was negative. Patient has no other pedal complaints at this time.  Patient Active Problem List   Diagnosis Date Noted  . Toe ulcer, right ([place]) 09/19/2015  . Decreased pedal pulses 09/19/2015  . OSA on CPAP 09/05/2015  . Major depressive disorder, recurrent episode, moderate ([place]) 09/05/2015  . OSA (obstructive sleep apnea) 07/25/2015  . Passed out 06/28/2015  . Low back pain 06/11/2015  . Abnormality of gait 06/11/2015  . Dizziness 03/17/2015  . Weakness 02/21/2015  . Chronic renal insufficiency, stage III (moderate) 08/09/2014  . Diabetes ([place]) 11/07/2013  . Hematuria 06/21/2013  . Hepatic steatosis 09/09/2010  . UTI 09/15/2006  . PYURIA 09/03/2006  . Human immunodeficiency virus (HIV) disease ([place]) 06/04/2006  . HERPES ZOSTER, UNCOMPLICATED 98/33/8250  . HSV 06/04/2006  . Depression 06/04/2006  . DISORDER, ATTENTION DEFICIT W/HYPERACTIVITY 06/04/2006  . THROMBOPHLEBITIS NOS 06/04/2006  . GERD 06/04/2006  . ARTHRITIS, HAND 06/04/2006  . HYPERGLYCEMIA, HX OF 06/04/2006  . HEPATITIS B, HX OF 06/04/2006   Current Outpatient Prescriptions on File Prior to Visit  Medication Sig Dispense Refill  . abacavir-dolutegravir-lamiVUDine (TRIUMEQ) 600-50-300 MG tablet Take 1 tablet by mouth daily. 90 tablet 1  . acetaminophen (TYLENOL) 500 MG tablet Take 1,000 mg by mouth every 8 (eight) hours as needed for moderate pain.     [name] ALPRAZolam (XANAX) 1 MG tablet Take 1 mg by mouth 3 (three) times daily as needed for anxiety.     [name] amphetamine-dextroamphetamine (ADDERALL) 30 MG tablet Take 30 mg by mouth 3 (three)  times daily.     [name] atorvastatin (LIPITOR) 10 MG tablet TAKE ONE TABLET BY MOUTH ONCE DAILY. 90 tablet 11  . Blood Glucose Monitoring Suppl (BLOOD GLUCOSE METER KIT AND SUPPLIES) Dispense based on patient and insurance preference. Use up to four times daily as directed. (FOR ICD-9 250.00, 250.01). 1 each 0  . divalproex (DEPAKOTE ER) 500 MG 24 hr tablet Take 1 tablet (500 mg total) by mouth at bedtime. 30 tablet 11  . glucose blood test strip Test blood sugar daily. Dx E11.9 50 each 11  . insulin glargine (LANTUS) 100 UNIT/ML injection Inject 60 units into skin at bedtime (Patient taking differently: Inject 80 Units into the skin at bedtime. ) 15 mL 11  . Insulin Pen Needle (PEN NEEDLES 3/16") 31G X 5 MM MISC Use to inject Lantus daily. 100 each 3  . levothyroxine (SYNTHROID, LEVOTHROID) 50 MCG tablet Take 1 tablet (50 mcg total) by mouth at bedtime. 30 tablet 11  . LYRICA 75 MG capsule TAKE ONE CAPSULE BY MOUTH THREE TIMES DAILY. 90 capsule 0  . prochlorperazine (COMPAZINE) 10 MG tablet TAKE ONE TABLET BY MOUTH EVERY 6 HOURS AS NEEDED. (Patient taking differently: TAKE ONE TABLET BY MOUTH EVERY 6 HOURS AS NEEDED FOR NAUSEA) 120 tablet 0  . ranitidine (ZANTAC) 150 MG tablet Take 150 mg by mouth daily.    . rivaroxaban (XARELTO) 15 MG TABS tablet Take 1 tablet (15 mg total) by mouth daily. 30 tablet 11  . TRINTELLIX 20 MG TABS Take 20 mg by mouth at bedtime.    [name] UNABLE TO FIND CPAP MACHINE  with standard [employer] nasal mask with humidifier. Set at 14 cwp 1 each 0  . VOLTAREN 1 % GEL APPLY 2 GRAMS TO EACH KNEE IN THE MORNING AND AT BEDTIME AND APPLY 1 GRAM TO EACH KNEE IN THE AFTERNOON. 300 g 5  . zolpidem (AMBIEN) 10 MG tablet Take 10-20 mg by mouth at bedtime as needed for sleep.      No current facility-administered medications on file prior to visit.    Allergies  Allergen Reactions  . Aspirin Swelling  . Ibuprofen Swelling  . Sustiva [Efavirenz] Rash and Swelling    And rash.  .  Acetaminophen     States he was told by Nephrologist not to use Tylenol due to CKD.    . Nsaids Other (See Comments)    unknwn    Recent Results (from the past 2160 hour(s))  COMPLETE METABOLIC PANEL WITH GFR     Status: Abnormal   Collection Time: 09/06/15  9:23 AM  Result Value Ref Range   Sodium 141 135 - 146 mmol/L   Potassium 4.1 3.5 - 5.3 mmol/L   Chloride 106 98 - 110 mmol/L   CO2 27 20 - 31 mmol/L   Glucose, Bld 173 (H) 65 - 99 mg/dL   BUN 19 7 - 25 mg/dL   Creat 1.66 (H) 0.70 - 1.25 mg/dL    Comment:   For patients > or = 62 years of age: The upper reference limit for Creatinine is approximately 13% higher for people identified as African-American.      Total Bilirubin 0.8 0.2 - 1.2 mg/dL   Alkaline Phosphatase 89 40 - 115 U/L   AST 12 10 - 35 U/L   ALT 14 9 - 46 U/L   Total Protein 6.6 6.1 - 8.1 g/dL   Albumin 4.0 3.6 - 5.1 g/dL   Calcium 8.9 8.6 - 10.3 mg/dL   GFR, Est African American 50 (L) >=60 mL/min   GFR, Est Non African American 44 (L) >=60 mL/min  POCT CBC     Status: Abnormal   Collection Time: 09/06/15  9:46 AM  Result Value Ref Range   WBC 6.6 4.6 - 10.2 K/uL   Lymph, poc 2.5 0.6 - 3.4   POC LYMPH PERCENT 38.2 10 - 50 %L   MID (cbc) 0.8 0 - 0.9   POC MID % 11.6 0 - 12 %M   POC Granulocyte 3.3 2 - 6.9   Granulocyte percent 50.2 37 - 80 %G   RBC 4.71 4.69 - 6.13 M/uL   Hemoglobin 15.9 14.1 - 18.1 g/dL   HCT, POC 44.2 43.5 - 53.7 %   MCV 93.8 80 - 97 fL   MCH, POC 33.8 (A) 27 - 31.2 pg   MCHC 36.0 (A) 31.8 - 35.4 g/dL   RDW, POC 14.0 %   Platelet Count, POC 135 (A) 142 - 424 K/uL   MPV 6.4 0 - 99.8 fL  POCT glucose (manual entry)     Status: Abnormal   Collection Time: 09/06/15  9:47 AM  Result Value Ref Range   POC Glucose 177 (A) 70 - 99 mg/dl  POCT glycosylated hemoglobin (Hb A1C)     Status: None   Collection Time: 09/06/15  9:53 AM  Result Value Ref Range   Hemoglobin A1C 7.0   WOUND CULTURE     Status: None   Collection Time:  09/12/15 12:03 PM  Result Value Ref Range   Gram Stain Rare    Gram  Stain WBC present-predominately PMN    Gram Stain No Squamous Epithelial Cells Seen    Gram Stain Moderate Gram Positive Cocci In Pairs    Gram Stain Rare Gram Negative Rods    Organism ID, Bacteria Multiple Organisms Present,None Predominant     Comment: No Staphylococcus aureus isolated NO GROUP A STREP (S. PYOGENES) ISOLATED   POCT CBC     Status: Abnormal   Collection Time: 09/12/15 12:09 PM  Result Value Ref Range   WBC 6.2 4.6 - 10.2 K/uL   Lymph, poc 2.0 0.6 - 3.4   POC LYMPH PERCENT 32.5 10 - 50 %L   MID (cbc) 0.6 0 - 0.9   POC MID % 9.3 0 - 12 %M   POC Granulocyte 3.6 2 - 6.9   Granulocyte percent 58.2 37 - 80 %G   RBC 4.73 4.69 - 6.13 M/uL   Hemoglobin 15.8 14.1 - 18.1 g/dL   HCT, POC 44.1 43.5 - 53.7 %   MCV 93.2 80 - 97 fL   MCH, POC 33.5 (A) 27 - 31.2 pg   MCHC 35.9 (A) 31.8 - 35.4 g/dL   RDW, POC 13.8 %   Platelet Count, POC 133 (A) 142 - 424 K/uL   MPV 6.4 0 - 99.8 fL  POCT glucose (manual entry)     Status: Abnormal   Collection Time: 09/12/15 12:12 PM  Result Value Ref Range   POC Glucose 414 (A) 70 - 99 mg/dl  BASIC METABOLIC PANEL WITH GFR     Status: Abnormal   Collection Time: 11/12/15 12:27 PM  Result Value Ref Range   Sodium 139 135 - 146 mmol/L   Potassium 4.1 3.5 - 5.3 mmol/L   Chloride 103 98 - 110 mmol/L   CO2 27 20 - 31 mmol/L   Glucose, Bld 255 (H) 65 - 99 mg/dL   BUN 19 7 - 25 mg/dL   Creat 1.45 (H) 0.70 - 1.25 mg/dL    Comment:   For patients > or = 62 years of age: The upper reference limit for Creatinine is approximately 13% higher for people identified as African-American.      Calcium 8.7 8.6 - 10.3 mg/dL   GFR, Est African American 59 (L) >=60 mL/min   GFR, Est Non African American 51 (L) >=60 mL/min  POCT glucose (manual entry)     Status: Abnormal   Collection Time: 11/12/15 12:41 PM  Result Value Ref Range   POC Glucose 251 (A) 70 - 99 mg/dl  POCT CBC      Status: Abnormal   Collection Time: 11/12/15 12:42 PM  Result Value Ref Range   WBC 7.1 4.6 - 10.2 K/uL   Lymph, poc 2.1 0.6 - 3.4   POC LYMPH PERCENT 29.3 10 - 50 %L   MID (cbc) 0.6 0 - 0.9   POC MID % 8.5 0 - 12 %M   POC Granulocyte 4.4 2 - 6.9   Granulocyte percent 62.2 37 - 80 %G   RBC 4.85 4.69 - 6.13 M/uL   Hemoglobin 16.4 14.1 - 18.1 g/dL   HCT, POC 45.3 43.5 - 53.7 %   MCV 93.3 80 - 97 fL   MCH, POC 33.9 (A) 27 - 31.2 pg   MCHC 36.3 (A) 31.8 - 35.4 g/dL   RDW, POC 14.1 %   Platelet Count, POC 133 (A) 142 - 424 K/uL   MPV 6.2 0 - 99.8 fL  POCT glycosylated hemoglobin (Hb A1C)  Status: None   Collection Time: 11/12/15 12:47 PM  Result Value Ref Range   Hemoglobin A1C 8.2   POCT CBC     Status: Abnormal   Collection Time: 11/26/15  5:42 PM  Result Value Ref Range   WBC 7.8 4.6 - 10.2 K/uL   Lymph, poc 2.0 0.6 - 3.4   POC LYMPH PERCENT 25.9 10 - 50 %L   MID (cbc) 0.5 0 - 0.9   POC MID % 6.7 0 - 12 %M   POC Granulocyte 5.3 2 - 6.9   Granulocyte percent 67.4 37 - 80 %G   RBC 4.72 4.69 - 6.13 M/uL   Hemoglobin 15.7 14.1 - 18.1 g/dL   HCT, POC 43.5 43.5 - 53.7 %   MCV 92.1 80 - 97 fL   MCH, POC 33.2 (A) 27 - 31.2 pg   MCHC 36.0 (A) 31.8 - 35.4 g/dL   RDW, POC 13.6 %   Platelet Count, POC 161 142 - 424 K/uL   MPV 6.2 0 - 99.8 fL  POCT glucose (manual entry)     Status: Abnormal   Collection Time: 11/26/15  5:43 PM  Result Value Ref Range   POC Glucose 238 (A) 70 - 99 mg/dl  CBC with Differential     Status: Abnormal   Collection Time: 11/26/15  7:38 PM  Result Value Ref Range   WBC 8.3 4.0 - 10.5 K/uL   RBC 4.93 4.22 - 5.81 MIL/uL   Hemoglobin 16.2 13.0 - 17.0 g/dL   HCT 46.2 39.0 - 52.0 %   MCV 93.7 78.0 - 100.0 fL   MCH 32.9 26.0 - 34.0 pg   MCHC 35.1 30.0 - 36.0 g/dL   RDW 13.6 11.5 - 15.5 %   Platelets 144 (L) 150 - 400 K/uL   Neutrophils Relative % 61 %   Neutro Abs 5.0 1.7 - 7.7 K/uL   Lymphocytes Relative 28 %   Lymphs Abs 2.3 0.7 - 4.0 K/uL    Monocytes Relative 8 %   Monocytes Absolute 0.7 0.1 - 1.0 K/uL   Eosinophils Relative 3 %   Eosinophils Absolute 0.3 0.0 - 0.7 K/uL   Basophils Relative 0 %   Basophils Absolute 0.0 0.0 - 0.1 K/uL  Comprehensive metabolic panel     Status: Abnormal   Collection Time: 11/26/15  7:38 PM  Result Value Ref Range   Sodium 139 135 - 145 mmol/L   Potassium 3.9 3.5 - 5.1 mmol/L   Chloride 106 101 - 111 mmol/L   CO2 25 22 - 32 mmol/L   Glucose, Bld 190 (H) 65 - 99 mg/dL   BUN 20 6 - 20 mg/dL   Creatinine, Ser 1.55 (H) 0.61 - 1.24 mg/dL   Calcium 9.3 8.9 - 10.3 mg/dL   Total Protein 7.3 6.5 - 8.1 g/dL   Albumin 3.7 3.5 - 5.0 g/dL   AST 23 15 - 41 U/L   ALT 21 17 - 63 U/L   Alkaline Phosphatase 99 38 - 126 U/L   Total Bilirubin 0.6 0.3 - 1.2 mg/dL   GFR calc non Af Amer 46 (L) >60 mL/min   GFR calc Af Amer 54 (L) >60 mL/min    Comment: (NOTE) The eGFR has been calculated using the CKD EPI equation. This calculation has not been validated in all clinical situations. eGFR's persistently <60 mL/min signify possible Chronic Kidney Disease.    Anion gap 8 5 - 15  Urinalysis, Routine w reflex microscopic (not at  [place])     Status: Abnormal   Collection Time: 11/26/15  9:09 PM  Result Value Ref Range   Color, Urine YELLOW YELLOW   APPearance CLEAR CLEAR   Specific Gravity, Urine 1.035 (H) 1.005 - 1.030   pH 6.5 5.0 - 8.0   Glucose, UA >1000 (A) NEGATIVE mg/dL   Hgb urine dipstick TRACE (A) NEGATIVE   Bilirubin Urine NEGATIVE NEGATIVE   Ketones, ur NEGATIVE NEGATIVE mg/dL   Protein, ur 100 (A) NEGATIVE mg/dL   Nitrite NEGATIVE NEGATIVE   Leukocytes, UA NEGATIVE NEGATIVE  Urine microscopic-add on     Status: Abnormal   Collection Time: 11/26/15  9:09 PM  Result Value Ref Range   Squamous Epithelial / LPF 0-5 (A) NONE SEEN   WBC, UA 0-5 0 - 5 WBC/hpf   RBC / HPF 0-5 0 - 5 RBC/hpf   Bacteria, UA FEW (A) NONE SEEN   Crystals CA OXALATE CRYSTALS (A) NEGATIVE    Objective: Vitals:    11/27/15 0935  BP: (!) 145/78  Pulse: 89  Resp: 20  Temp: 99.3 F (37.4 C)    General: Patient is awake, alert, oriented x 3 and in no acute distress.  Dermatology: Skin is warm and dry bilateral with a Partial thickness ulceration present Plantar right hallux and 4th toe with  ulcerations at 2nd and 3rd toes and a blister at right 5th toe with early necrosis and dry gangrenous changes to the toes on right foot. Ulcerations measures <0.5cm, slightly improved in appearance with less weeping, There are mixed fibro-granular tissue present with surrounding eschar and necrosis. The ulcerations do not probe to bone however close to bone on 4th toe right foot. There is no malodor, no active drainage, + focal erythema, + focal edema. No other acute signs of infection. No other open lesions.    Vascular: Dorsalis Pedis pulse = +4/4 on right, left 0/4,  Posterior Tibial pulse = 0/4 Bilateral,  Capillary Fill Time < 5 seconds. 1+ pitting edema and mild varicosities.  Neurologic: Protective sensation severely diminished to the level of the ankles with the 5.07/10g [employer].  Musculosketal: No Pain with palpation to ulcerated areas. No pain with compression to calves bilateral.  Assessment and Plan:  Problem List Items Addressed This Visit    None    Visit Diagnoses    Toe ulcer, right, limited to breakdown of skin ([name])    -  Primary   Relevant Medications   amoxicillin-clavulanate (AUGMENTIN) 875-125 MG tablet   Diminished pulses in lower extremity       Relevant Medications   amoxicillin-clavulanate (AUGMENTIN) 875-125 MG tablet   Diabetic neuropathy with neurologic complication ([name])       Relevant Medications   amoxicillin-clavulanate (AUGMENTIN) 875-125 MG tablet   PAD (peripheral artery disease) ([name])       Relevant Medications   amoxicillin-clavulanate (AUGMENTIN) 875-125 MG tablet   Blister of toe, right, initial encounter       Relevant Medications    amoxicillin-clavulanate (AUGMENTIN) 875-125 MG tablet   Gangrene ([name])       Relevant Medications   amoxicillin-clavulanate (AUGMENTIN) 875-125 MG tablet     -Examined patient and discussed the progression of the wounds and treatment alternatives. -Patient is awaiting Vascular intervention by Dr. Gwenlyn Found as discussed with his cardiologist who is to arrange appt - Cleansed ulcerations right foot -Applied betadine and dry sterile dressing and instructed patient to continue with daily dressings at home consisting of the same covered  with dry sterile dressing with care to avoid wrapping dressings to tightly around toes. -Continue with Augmentin  -Advised patient to go to the ER if worsens -Continue with postoperative shoe -Patient to return to office in 2 weeks for follow up care and evaluation or sooner if problems arise.  Landis Martins, DPM

## 2015-11-27 NOTE — ED Notes (Signed)
Nanavati, MD at bedside. 

## 2015-11-29 ENCOUNTER — Telehealth: Payer: Self-pay | Admitting: *Deleted

## 2015-11-29 DIAGNOSIS — L97519 Non-pressure chronic ulcer of other part of right foot with unspecified severity: Secondary | ICD-10-CM

## 2015-11-29 NOTE — Telephone Encounter (Signed)
Left message to call back  

## 2015-11-29 NOTE — Telephone Encounter (Signed)
-----   Message from Skeet Latch, MD sent at 11/23/2015  4:45 PM EDT ----- [name],  We will have him evaluated by our vascular specialist, Dr. Gwenlyn Found.  Thanks, Tiffany ----- Message ----- From: Landis Martins, DPM Sent: 11/21/2015   6:47 PM To: Skeet Latch, MD  I am treating Mr. Viney for right toe ulcerations. His ulcerations are progressive becoming worse with necrosis, blistering, and early changes concerning for dry gangrene now involving all toes on right foot patient had vascular studies performed that revealed ABI noncompressable at toes, larger vessels ABI  1.23 right and 1.18 left. I discussed with patient today in office possibility of losing part of his foot if these ulcerations continue to worsen. Can you please review chart and re-evaluate patient for possible vascular intervention. If you have any questions feel free to call me personally at [phone]. Thank you! -[name]

## 2015-11-30 ENCOUNTER — Ambulatory Visit: Payer: BLUE CROSS/BLUE SHIELD

## 2015-12-03 ENCOUNTER — Telehealth: Payer: Self-pay | Admitting: *Deleted

## 2015-12-03 DIAGNOSIS — R0989 Other specified symptoms and signs involving the circulatory and respiratory systems: Secondary | ICD-10-CM

## 2015-12-03 NOTE — Telephone Encounter (Addendum)
Pt states he saw [name] last Tuesday and was to be referred to a vascular surgeon, but hasn't heard any word. 12/04/2015-Left message informing pt is had researched his appt schedule and saw he had an appt with Dr. Gwenlyn Found 12/07/2015 at 11:00am, left [place] [phone] to call for information and to call me if more information was needed from [place]. 12/04/2015-Pt states he is calling again concerning his call from yesterday. I called pt and left message explained I had left message with his appt time at Dr. Gwenlyn Found 12/07/2015 at 11:00am. I hung up and called pt back immediately and pt picked up. I confirmed with pt he had an appt with Dr. Gwenlyn Found on 12/07/2015 at 11:00am, pt states he may have to change an appt at the same time with Dr. [name] on 12/07/2015 at the same time. I gave pt the appt line for [facility]. 12/24/2015-Pt states "Dr. Barry Dienes" ordered the same circulation studies ordered by [name] in 10/26/2015, pt states Dr. Barry Dienes said it was not the same test, but the technician said it was.  I spoke [place] - male receptionist and he states pt saw Dr. Gwenlyn Found 12/07/2015, and cancelled his testing today after the appt time.01/09/2016-Pt states he has not received a call from Dr. Kennon Holter office and would like to proceed to another vascular surgeon. Dr.Stover states refer to [facility]. Left message informing pt of referral to [facility], location and phone number. Faxed referral, demographics and clinicals to [facility].

## 2015-12-03 NOTE — Telephone Encounter (Signed)
I spoke with his cardiology Skeet Latch who is suppose to get Dr. Gwenlyn Found to see him. Have the patient to call Dr. Blenda Mounts office to follow up on this. Meanwhile we can send an official consultation to Dr. Gwenlyn Found for non healing worsening arterial ulcerations to toes right foot. Past ABI's non compressible to toes. Thanks [name]

## 2015-12-04 ENCOUNTER — Ambulatory Visit: Payer: BLUE CROSS/BLUE SHIELD | Admitting: Sports Medicine

## 2015-12-04 ENCOUNTER — Ambulatory Visit: Payer: BLUE CROSS/BLUE SHIELD | Admitting: Podiatry

## 2015-12-04 ENCOUNTER — Telehealth: Payer: Self-pay | Admitting: Cardiovascular Disease

## 2015-12-04 NOTE — Telephone Encounter (Signed)
Carotid studies should be ordered by his vascular doctor. But he has already had ABI/PVRs. Dr. [name] was suppose to get Dr. Gwenlyn Found to see if any intervention could be done since the vessel to digits are noncompressible. Thanks [name]

## 2015-12-04 NOTE — Telephone Encounter (Signed)
Received records from [facility] for appointment with Dr Gwenlyn Found on 12/07/15. Records given to [facility] (medical records) for Dr Kennon Holter schedule o n 12/07/15. lp

## 2015-12-05 ENCOUNTER — Ambulatory Visit (INDEPENDENT_AMBULATORY_CARE_PROVIDER_SITE_OTHER): Payer: BLUE CROSS/BLUE SHIELD | Admitting: Family Medicine

## 2015-12-05 ENCOUNTER — Encounter: Payer: Self-pay | Admitting: Neurology

## 2015-12-05 ENCOUNTER — Ambulatory Visit (INDEPENDENT_AMBULATORY_CARE_PROVIDER_SITE_OTHER): Payer: BLUE CROSS/BLUE SHIELD | Admitting: Neurology

## 2015-12-05 VITALS — BP 156/90 | HR 95 | Ht 71.0 in | Wt 237.8 lb

## 2015-12-05 VITALS — BP 132/80 | HR 84 | Temp 99.1°F | Resp 17 | Ht 70.5 in | Wt 241.0 lb

## 2015-12-05 DIAGNOSIS — G4733 Obstructive sleep apnea (adult) (pediatric): Secondary | ICD-10-CM

## 2015-12-05 DIAGNOSIS — W19XXXA Unspecified fall, initial encounter: Secondary | ICD-10-CM | POA: Insufficient documentation

## 2015-12-05 DIAGNOSIS — Z794 Long term (current) use of insulin: Secondary | ICD-10-CM | POA: Diagnosis not present

## 2015-12-05 DIAGNOSIS — L97511 Non-pressure chronic ulcer of other part of right foot limited to breakdown of skin: Secondary | ICD-10-CM | POA: Diagnosis not present

## 2015-12-05 DIAGNOSIS — N189 Chronic kidney disease, unspecified: Secondary | ICD-10-CM | POA: Diagnosis not present

## 2015-12-05 DIAGNOSIS — I739 Peripheral vascular disease, unspecified: Secondary | ICD-10-CM | POA: Diagnosis not present

## 2015-12-05 DIAGNOSIS — E1140 Type 2 diabetes mellitus with diabetic neuropathy, unspecified: Secondary | ICD-10-CM | POA: Diagnosis not present

## 2015-12-05 DIAGNOSIS — R42 Dizziness and giddiness: Secondary | ICD-10-CM | POA: Diagnosis not present

## 2015-12-05 DIAGNOSIS — Z9989 Dependence on other enabling machines and devices: Secondary | ICD-10-CM

## 2015-12-05 DIAGNOSIS — R269 Unspecified abnormalities of gait and mobility: Secondary | ICD-10-CM | POA: Diagnosis not present

## 2015-12-05 MED ORDER — DIVALPROEX SODIUM ER 500 MG PO TB24: 500.0000 mg | ORAL_TABLET | Freq: Every day | ORAL | 4 refills | Status: DC

## 2015-12-05 NOTE — Patient Instructions (Addendum)
  Keep a record of your blood pressures outside of the office and bring them to the next office visit. If it remains above 140/90, return to discuss your medications, but bring your meter with you.   If any increased dizziness or falls, call your neurologist or go to the emergency room to look into other causes.   For vertigo - follow up with Ear Nose and throat.   70 units lantus insulin for now. If any symptomatic low blood sugars, decrease that back to 65 units. If your blood sugars are remaining over 250, we may need to increase that further. Let me know if that occurs prior to seeing endocrinology. I also referred you to a diabetic nutritionist.  Return to the clinic or go to the nearest emergency room if any of your symptoms worsen or new symptoms occur.       IF you received an x-ray today, you will receive an invoice from [facility]. Please contact [facility] at [phone] with questions or concerns regarding your invoice.   IF you received labwork today, you will receive an invoice from [employer]. Please contact [facility] at [phone] with questions or concerns regarding your invoice.   Our billing staff will not be able to assist you with questions regarding bills from these companies.  You will be contacted with the lab results as soon as they are available. The fastest way to get your results is to activate your My Chart account. Instructions are located on the last page of this paperwork. If you have not heard from [name] regarding the results in 2 weeks, please contact this office.

## 2015-12-05 NOTE — Progress Notes (Signed)
Subjective:  By signing my name below, I, Daniel [name], attest that this documentation has been prepared under the direction and in the presence of Daniel Ray, MD. Electronically Signed: [name], Daniel [name]. 12/05/2015 , 4:53 PM .  Patient was seen in Room 8 .   Patient ID: Daniel [name], male    DOB: [birth year], 60 y.o.   MRN: [id number] Chief Complaint  Patient presents with  . Follow-up    diabetes    HPI Daniel [name] is a 60 y.o. male Here for follow up of diabetes. Last seen by me on Oct 2nd for multiple concerns.   Lab Results  Component Value Date   HGBA1C 8.2 11/12/2015   On Oct 2nd visit, he states he had frequent falls and off-balance symptoms since that morning. It did not seem typical of vertigo. He did have uncontrolled DM a week prior with glucose of 480. He had increased lantus from 60 units to 80 units. He was sent to the ER for further evaluation and rule out TIA or neurological causes. He had a negative head CT, renal insufficiency at baseline, urine was concentrated which was treated with IV fluid. He was discharged to follow up with neurology. He was seen by Dr. Krista [name] today for chronic migraines and peripheral neuropathy.   Patient states that he has the math down where every unit of insulin drop his [name] sugar about 5 points. He changes his insulin according to his sugar measured at night. He's checked his sugar as low as 126 last week. His most recent [name] sugar, today was 228. No symptomatic lows. He had taken 70 units last night, and also reports eating an oatmeal cookie in the middle of the night "because I wanted a cookie." He also has peripheral disease and his toes are starting to be worn out; plans to see vascular in 2 days.   He denies having an endocrinologist. He denies having a nutritionist. Knows his diet could be improved.   HTN He was seen by Dr. Krista [name] today and notes his BP was initially 180/80s. After several other BP checks, ultimately,  his BP was at 156/90. He's been using vaporizer with cpap machine because he believes the cpap machine has been dehydrating him. He denies any true falls since hospital visit. His unsteadiness has improved.    Patient Active Problem List   Diagnosis Date Noted  . Fall 12/05/2015  . Toe ulcer, right ([place]) 09/19/2015  . Decreased pedal pulses 09/19/2015  . OSA on CPAP 09/05/2015  . Major depressive disorder, recurrent episode, moderate (Daniel [name]) 09/05/2015  . OSA (obstructive sleep apnea) 07/25/2015  . Passed out 06/28/2015  . Low back pain 06/11/2015  . Abnormality of gait 06/11/2015  . Dizziness 03/17/2015  . Weakness 02/21/2015  . Chronic renal insufficiency, stage III (moderate) 08/09/2014  . Diabetes ([place]) 11/07/2013  . Hematuria 06/21/2013  . Hepatic steatosis 09/09/2010  . UTI 09/15/2006  . PYURIA 09/03/2006  . Human immunodeficiency virus (HIV) disease ([place]) 06/04/2006  . HERPES ZOSTER, UNCOMPLICATED 74/82/7078  . HSV 06/04/2006  . Depression 06/04/2006  . DISORDER, ATTENTION DEFICIT W/HYPERACTIVITY 06/04/2006  . THROMBOPHLEBITIS NOS 06/04/2006  . GERD 06/04/2006  . ARTHRITIS, HAND 06/04/2006  . HYPERGLYCEMIA, HX OF 06/04/2006  . HEPATITIS B, HX OF 06/04/2006   Past Medical History:  Diagnosis Date  . ADHD (attention deficit hyperactivity disorder)   . Anxiety   . Chronic kidney disease   . Clotting disorder ([place])   . Depression   .  Diabetes mellitus without complication ([place])   . Diabetes mellitus, type II ([place])   . Dizziness 03/17/2015  . GERD (gastroesophageal reflux disease)   . HIV disease ([place])   . HIV infection ([place])   . Liver disease   . OSA (obstructive sleep apnea) 07/25/2015   Uses CPAP regularly  . Ulcer [facility])    Past Surgical History:  Procedure Laterality Date  . SMALL INTESTINE SURGERY    . STOMACH SURGERY     Allergies  Allergen Reactions  . Aspirin Swelling  . Ibuprofen Swelling  . Sustiva [Efavirenz] Rash and Swelling    And rash.  .  Acetaminophen     States he was told by Nephrologist not to use Tylenol due to CKD.    . Nsaids Other (See Comments)    unknwn   Prior to Admission medications   Medication Sig Start Date End Date Taking? Authorizing Provider  abacavir-dolutegravir-lamiVUDine (TRIUMEQ) 600-50-300 MG tablet Take 1 tablet by mouth daily. 09/03/15  Yes Daniel Headings, MD  acetaminophen (TYLENOL) 500 MG tablet Take 1,000 mg by mouth every 8 (eight) hours as needed for moderate pain.    Yes Historical Provider, MD  ALPRAZolam Duanne Moron) 1 MG tablet Take 1 mg by mouth 3 (three) times daily as needed for anxiety.    Yes Historical Provider, MD  amoxicillin-clavulanate (AUGMENTIN) 875-125 MG tablet Take 1 tablet by mouth 2 (two) times daily. 11/27/15  Yes Daniel [name], DPM  amphetamine-dextroamphetamine (ADDERALL) 30 MG tablet Take 30 mg by mouth 3 (three) times daily.    Yes Historical Provider, MD  atorvastatin (LIPITOR) 10 MG tablet TAKE ONE TABLET BY MOUTH ONCE DAILY. 01/12/15  Yes Daniel R Brewington, PA-C  [name] Glucose Monitoring Suppl ([name] GLUCOSE METER KIT AND SUPPLIES) Dispense based on patient and insurance preference. Use up to four times daily as directed. (FOR ICD-9 250.00, 250.01). 09/08/13  Yes Daniel Russian, MD  divalproex (DEPAKOTE ER) 500 MG 24 hr tablet Take 1 tablet (500 mg total) by mouth at bedtime. 12/05/15  Yes Daniel Pacas, MD  glucose [name] test strip Test [name] sugar daily. Dx E11.9 01/10/15  Yes Daniel R Brewington, PA-C  insulin glargine (LANTUS) 100 UNIT/ML injection Inject 60 units into skin at bedtime Patient taking differently: Inject 80 Units into the skin at bedtime.  11/12/15  Yes Daniel Russian, MD  Insulin Pen Needle (PEN NEEDLES 3/16") 31G X 5 MM MISC Use to inject Lantus daily. 03/30/15  Yes Daniel Russian, MD  levothyroxine (SYNTHROID, LEVOTHROID) 50 MCG tablet Take 1 tablet (50 mcg total) by mouth at bedtime. 07/24/15  Yes Daniel Russian, MD  LYRICA 75 MG capsule TAKE ONE CAPSULE BY MOUTH  THREE TIMES DAILY. 11/19/15  Yes Daniel Russian, MD  prochlorperazine (COMPAZINE) 10 MG tablet TAKE ONE TABLET BY MOUTH EVERY 6 HOURS AS NEEDED. Patient taking differently: TAKE ONE TABLET BY MOUTH EVERY 6 HOURS AS NEEDED FOR NAUSEA 11/23/14  Yes Daniel Headings, MD  ranitidine (ZANTAC) 150 MG tablet Take 150 mg by mouth daily.   Yes Historical Provider, MD  rivaroxaban (XARELTO) 15 MG TABS tablet Take 1 tablet (15 mg total) by mouth daily. 09/13/15  Yes Daniel Russian, MD  TRINTELLIX 20 MG TABS Take 20 mg by mouth at bedtime. 01/26/15  Yes Historical Provider, MD  UNABLE TO FIND CPAP MACHINE with standard [employer] nasal mask with humidifier. Set at 14 cwp 04/12/13  Yes Daniel Russian, MD  VOLTAREN 1 % GEL  APPLY 2 GRAMS TO EACH KNEE IN THE MORNING AND AT BEDTIME AND APPLY 1 GRAM TO EACH KNEE IN THE AFTERNOON. 02/23/15  Yes Daniel Russian, MD  zolpidem (AMBIEN) 10 MG tablet Take 10-20 mg by mouth at bedtime as needed for sleep.    Yes Historical Provider, MD   Social History   Social History  . Marital status: Single    Spouse name: N/A  . Number of children: N/A  . Years of education: N/A   Occupational History  . Not on file.   Social History Main Topics  . Smoking status: Former Smoker    Packs/day: 0.10    Years: 10.00    Types: Cigars, Cigarettes    Quit date: 08/09/2014  . Smokeless tobacco: Never Used  . Alcohol use No  . Drug use: No  . Sexual activity: Not Currently    Partners: Male     Comment: pt. declined condoms   Other Topics Concern  . Not on file   Social History Narrative   Epworth Sleepiness Scale = 7 (as of 03/16/2015)   Review of Systems  Constitutional: Negative for fatigue and unexpected weight change.  Eyes: Negative for visual disturbance.  Respiratory: Negative for cough, chest tightness and shortness of breath.   Cardiovascular: Negative for chest pain, palpitations and leg swelling.  Gastrointestinal: Negative for abdominal pain and [name] in stool.    Neurological: Negative for dizziness, light-headedness and headaches.       Objective:   Physical Exam  Constitutional: He is oriented to person, place, and time. He appears well-developed and well-nourished.  HENT:  Head: Normocephalic and atraumatic.  Eyes: EOM are normal. Pupils are equal, round, and reactive to light.  Neck: No JVD present. Carotid bruit is not present.  Cardiovascular: Normal rate, regular rhythm and normal heart sounds.   No murmur heard. Pulmonary/Chest: Effort normal and breath sounds normal. He has no rales.  Musculoskeletal: He exhibits no edema (no pitting).  Neurological: He is alert and oriented to person, place, and time.  Skin: Skin is warm and dry.  Psychiatric: He has a normal mood and affect.  Vitals reviewed.   Vitals:   12/05/15 1546  BP: 132/80  Pulse: 84  Resp: 17  Temp: 99.1 F (37.3 C)  TempSrc: Oral  SpO2: 98%  Weight: 241 lb (109.3 kg)  Height: 5' 10.5" (1.791 m)   approx 9mns  reviewing notes from neurology and ER, additional 25with patient with majority counseling.     Assessment & Plan:   DMAVRYK PINOis a 60y.o. male Type 2 diabetes mellitus with diabetic neuropathy, with long-term current use of insulin ([place] - Plan: Amb ref to Medical Nutrition Therapy-MNT, Ambulatory referral to Endocrinology  - uncontrolled and likely need mealtime insulin.    -refer to endocrinology, 70 units lantus for now with hypo and hyperglycemia precautions.   -refer to diabetic nutritionist.   Chronic kidney disease, unspecified CKD stage  - avoiding metformin, avoid other nephrotoxins. Creatinine overall stable on 10/2.   PVD (peripheral vascular disease) ([place]  -has follow up planned. Discussed need for tight glycemic control to lessen further progression of this and CKD, other diabetic complications.    -borderline HTN. Ok in office. Monitor outside readings and RTC precautions.   Dizziness  -improved. Underlying peripheral  neuropathy, cautioned on work in yard as advised by neuro per his report.  Maintain hydration and if further episodes - discuss with neuro ( or ENT if vertigo  sx's)  ER/RTC precautions.   No orders of the defined types were placed in this encounter.  Patient Instructions    Keep a record of your [name] pressures outside of the office and bring them to the next office visit. If it remains above 140/90, return to discuss your medications, but bring your meter with you.   If any increased dizziness or falls, call your neurologist or go to the emergency room to look into other causes.   For vertigo - follow up with Ear Nose and throat.   70 units lantus insulin for now. If any symptomatic low [name] sugars, decrease that back to 65 units. If your [name] sugars are remaining over 250, we may need to increase that further. Let me know if that occurs prior to seeing endocrinology. I also referred you to a diabetic nutritionist.  Return to the clinic or go to the nearest emergency room if any of your symptoms worsen or new symptoms occur.       IF you received an x-[name] today, you will receive an invoice from [facility]. Please contact [facility] at [phone] with questions or concerns regarding your invoice.   IF you received labwork today, you will receive an invoice from [employer]. Please contact [facility] at [phone] with questions or concerns regarding your invoice.   Our billing staff will not be able to assist you with questions regarding bills from these companies.  You will be contacted with the lab results as soon as they are available. The fastest way to get your results is to activate your My Chart account. Instructions are located on the last page of this paperwork. If you have not heard from [name] regarding the results in 2 weeks, please contact this office.        I personally performed the services described in this  documentation, which was scribed in my presence. The recorded information has been reviewed and considered, and addended by me as needed.   Signed,   Daniel Ray, MD [facility].  12/06/15 5:14 PM

## 2015-12-05 NOTE — Progress Notes (Signed)
Chief Complaint  Patient presents with  . Peripheral Neuropathy    States taking Lyrica daily keeps his symptoms tolerable.  . Frequent Falls    He has been having frequent falls and running into walls.  Denies dizziness.  He had a normal CT scan recently in the ED.  He feels his symptoms are related to dehydration.  He has been trying to increase his water intake.  . Migraine    Denies having any migraines.  . OSA on CPAP    States he is wearing his CPAP machine nightly.      PATIENT: Daniel [name] DOB: 26-Jul-1955  Chief Complaint  Patient presents with  . Peripheral Neuropathy    States taking Lyrica daily keeps his symptoms tolerable.  . Frequent Falls    He has been having frequent falls and running into walls.  Denies dizziness.  He had a normal CT scan recently in the ED.  He feels his symptoms are related to dehydration.  He has been trying to increase his water intake.  . Migraine    Denies having any migraines.  . OSA on CPAP    States he is wearing his CPAP machine nightly.     HISTORICAL  Daniel [name] is a 60 years old right-handed male , seen in refer by Dr. Brett Fairy and his primary care physician Dr. Ivar Bury for evaluation of unsteady Gait in April 62 2017  I have reviewed and summarized referring note, he had a history of hypertension, hyperlipidemia, diabetes, HIV for more than 10 years, on treatment, most recent CD4 was 740, obstructive sleep apnea, using CPAP machine, hx of right leg DVT, PE on chronic anticoagulations, xelrato, he used to work for a [employer], which requires ladder climbing, last time he worked was December 25 2014.  He has some chronic low back pain, neck pain, but denies radiating pain to upper or lower extremity, he has bilateral feet paresthesia due to diabetic peripheral neuropathy for many years, denies bowel and bladder incontinence  In February 21 2015, he fell asleep on his sofa, when he tried to get up next morning, he had  difficulty bearing weight, bilateral lower extremity weakness, he was taken to the [facility], his bilateral lower extremity gradually improved, he was discharged the same day, was able to ambulate again, there was no clear etiology found.  But since that event, he fell 3 times over the past few months, his right toes tend to be caught on the floor, sometimes without clear triggers.  I have personally reviewed MRI of the brain without contrast in March 2017, mild supratentorium small vessel disease,  right ventral pontine small vessel disease, less likely explaining his complains of gait difficulty   UPDATE May 4th 2017: Echocardiogram in February 2017: Ejection fraction 60-65%, word thickness was increased in a pattern of mild left ventricular hypertrophy.  We have personally reviewed MRI of lumbar in April 2017:  At L4-L5 there is moderate facet hypertrophy causing mild foraminal and minimal lateral recess narrowing. There is no nerve root impingement.  L5-S1 there is a small midline disc protrusion and mild facet hypertrophy. There is mild right and minimal left foraminal narrowing. There does not appear to be any nerve root impingement.  Recent sleep study confirmed obstructive sleep apnea, CPAP is recommended  Today he complains lightheadedness with sudden positional change, intermittent bilateral feet paresthesia, he continue have falling episode, when he woke up from sleep, sofa, confused, could not get  up  UPDATE August 16th 2017: EEG was normal in June 2017. EMG nerve conduction study consistent with mild axonal peripheral neuropathy consistent with his diagnosis of diabetes, He was put on Depakote ER 500 mg every night since May 2017, there was no recurrent confusion episode but he has occasionally bilateral lower extremity give out underneath him, difficulty getting up, he also complains of frequent orthostatic dizziness. Reviewed laboratory evaluation A1c 7.0 in July, normal  CBC, hemoglobin 15 point 9, but in September 12 2015, glucose level was 414, creatinine 1.66,  He also complains of chronic migraine headaches,  UPDATE Oct 11th 2017: He returned to clinic for recurrent episode of frequent falling, he actually was referred to emergency room by his primary care physician on November 27 2015, for similar complaints  He reported one episode when he got up from overnight sleep, rushing to the bathroom, felt lightheaded almost fall, also reported recurrent episodes of falling forward after kneeling down working on his yard work.  He has poorly controlled DM, A1c in September 2017 was 8.2, he has evidence of diabetic peripheral neuropathy, mild orthostatic blood pressure changes on today's visit   he is also on polypharmacy treatment DVT, taking chronic Xarelto,  Ambien 10 mg for chronic insomnia, continue take Depakote ER 500 mg every night as migraine prevention, xanax 27m 3-4 times a day  Personally reviewed CT head without contrast in October 2017, no acute abnormality  REVIEW OF SYSTEMS: Full 14 system review of systems performed and notable only for: Frequent urination, dizziness, confusion, decreased quick's concentration, depression, hyperactivity, joint pain, back pain, walking difficulty, vomiting, apnea, eye pain, cough, leg swelling, hearing loss runny nose   ALLERGIES: Allergies  Allergen Reactions  . Aspirin Swelling  . Ibuprofen Swelling  . Sustiva [Efavirenz] Rash and Swelling    And rash.  . Acetaminophen     States he was told by Nephrologist not to use Tylenol due to CKD.    . Nsaids Other (See Comments)    unknwn    HOME MEDICATIONS: Current Outpatient Prescriptions  Medication Sig Dispense Refill  . abacavir-dolutegravir-lamiVUDine (TRIUMEQ) 600-50-300 MG tablet Take 1 tablet by mouth daily. 90 tablet 1  . acetaminophen (TYLENOL) 500 MG tablet Take 1,000 mg by mouth every 8 (eight) hours as needed for moderate pain.     .Marland KitchenALPRAZolam (XANAX)  1 MG tablet Take 1 mg by mouth 3 (three) times daily as needed for anxiety.     .Marland Kitchenamoxicillin-clavulanate (AUGMENTIN) 875-125 MG tablet Take 1 tablet by mouth 2 (two) times daily. 28 tablet 1  . amphetamine-dextroamphetamine (ADDERALL) 30 MG tablet Take 30 mg by mouth 3 (three) times daily.     .Marland Kitchenatorvastatin (LIPITOR) 10 MG tablet TAKE ONE TABLET BY MOUTH ONCE DAILY. 90 tablet 11  . Blood Glucose Monitoring Suppl (BLOOD GLUCOSE METER KIT AND SUPPLIES) Dispense based on patient and insurance preference. Use up to four times daily as directed. (FOR ICD-9 250.00, 250.01). 1 each 0  . divalproex (DEPAKOTE ER) 500 MG 24 hr tablet Take 1 tablet (500 mg total) by mouth at bedtime. 30 tablet 11  . glucose blood test strip Test blood sugar daily. Dx E11.9 50 each 11  . insulin glargine (LANTUS) 100 UNIT/ML injection Inject 60 units into skin at bedtime (Patient taking differently: Inject 80 Units into the skin at bedtime. ) 15 mL 11  . Insulin Pen Needle (PEN NEEDLES 3/16") 31G X 5 MM MISC Use to inject Lantus daily. 100 each  3  . levothyroxine (SYNTHROID, LEVOTHROID) 50 MCG tablet Take 1 tablet (50 mcg total) by mouth at bedtime. 30 tablet 11  . LYRICA 75 MG capsule TAKE ONE CAPSULE BY MOUTH THREE TIMES DAILY. 90 capsule 0  . prochlorperazine (COMPAZINE) 10 MG tablet TAKE ONE TABLET BY MOUTH EVERY 6 HOURS AS NEEDED. (Patient taking differently: TAKE ONE TABLET BY MOUTH EVERY 6 HOURS AS NEEDED FOR NAUSEA) 120 tablet 0  . ranitidine (ZANTAC) 150 MG tablet Take 150 mg by mouth daily.    . rivaroxaban (XARELTO) 15 MG TABS tablet Take 1 tablet (15 mg total) by mouth daily. 30 tablet 11  . TRINTELLIX 20 MG TABS Take 20 mg by mouth at bedtime.    [name] UNABLE TO FIND CPAP MACHINE with standard Aclaim nasal mask with humidifier. Set at 14 cwp 1 each 0  . VOLTAREN 1 % GEL APPLY 2 GRAMS TO EACH KNEE IN THE MORNING AND AT BEDTIME AND APPLY 1 GRAM TO EACH KNEE IN THE AFTERNOON. 300 g 5  . zolpidem (AMBIEN) 10 MG tablet  Take 10-20 mg by mouth at bedtime as needed for sleep.      No current facility-administered medications for this visit.     PAST MEDICAL HISTORY: Past Medical History:  Diagnosis Date  . ADHD (attention deficit hyperactivity disorder)   . Anxiety   . Chronic kidney disease   . Clotting disorder ([place])   . Depression   . Diabetes mellitus without complication ([place])   . Diabetes mellitus, type II ([place])   . Dizziness 03/17/2015  . GERD (gastroesophageal reflux disease)   . HIV disease ([place])   . HIV infection ([place])   . Liver disease   . OSA (obstructive sleep apnea) 07/25/2015   Uses CPAP regularly  . Ulcer ([place])     PAST SURGICAL HISTORY: Past Surgical History:  Procedure Laterality Date  . SMALL INTESTINE SURGERY    . STOMACH SURGERY      FAMILY HISTORY: Family History  Problem Relation Age of Onset  . Cancer Brother   . Depression Brother   . COPD Mother     SOCIAL HISTORY:  Social History   Social History  . Marital Status: Single    Spouse Name: N/A  . Number of Children: N/A  . Years of Education: N/A   Occupational History  . 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 Smoking status: Former Smoker -- 0.10 packs/day for 10 years    Types: Cigars, Cigarettes  . Smokeless tobacco: Never Used  . Alcohol Use: No  . Drug Use: No  . Sexual Activity:    Partners: Male     Comment: pt. declined condoms   Other Topics Concern  . Not on file   Social History Narrative   Epworth Sleepiness Scale = 7 (as of 03/16/2015)     PHYSICAL EXAM   Vitals:   12/05/15 1157  BP: (!) 156/90  Pulse: 95  Weight: 237 lb 12 oz (107.8 kg)  Height: 5' 11"  (1.803 m)    Not recorded      Body mass index is 33.16 kg/m.  PHYSICAL EXAMNIATION:  Gen: NAD, conversant, well nourised, obese, well groomed                     Cardiovascular: Regular rate rhythm, no peripheral edema, warm,  nontender. Eyes: Conjunctivae clear without exudates  or hemorrhage Neck: Supple, no carotid bruise. Pulmonary: Clear to auscultation bilaterally   NEUROLOGICAL EXAM:  MENTAL STATUS: Speech:    Speech is normal; fluent and spontaneous with normal comprehension.  Cognition:     Orientation to time, place and person     Normal recent and remote memory     Normal Attention span and concentration     Normal Language, naming, repeating,spontaneous speech     Fund of knowledge   CRANIAL NERVES: CN II: Visual fields are full to confrontation. Fundoscopic exam is normal with sharp discs and no vascular changes. Pupils are round equal and briskly reactive to light. CN III, IV, VI: extraocular movement are normal. No ptosis. CN V: Facial sensation is intact to pinprick in all 3 divisions bilaterally. Corneal responses are intact.  CN VII: Face is symmetric with normal eye closure and smile. CN VIII: Hearing is normal to rubbing fingers CN IX, X: Palate elevates symmetrically. Phonation is normal. CN XI: Head turning and shoulder shrug are intact CN XII: Tongue is midline with normal movements and no atrophy.  MOTOR: There is no pronator drift of out-stretched arms. Muscle bulk and tone are normal. Muscle strength is normal.  REFLEXES: Reflexes are 2+ and symmetric at the biceps, triceps,  1 at bilateral knees, and absent at  ankles. Plantar responses are flexor.  SENSORY: Mildly length dependent decreased light touch pinprick to ankle level  COORDINATION: Rapid alternating movements and fine finger movements are intact. There is no dysmetria on finger-to-nose and heel-knee-shin.    GAIT/STANCE: Posture is  hesitate, moderate stride, able to stand up on heel and toe, he could not perform tandem walking, Romberg signs was negative  DIAGNOSTIC DATA (LABS, IMAGING, TESTING) - I reviewed patient records, labs, notes, testing and imaging myself where available.   ASSESSMENT AND  PLAN  Daniel [name] is a 60 y.o. male    Diabetic peripheral neuropathy Presyncope, dizziness, falling episode Multifactorial, this includes poorly controlled diabetes, A1c was 8.2 in September 2017, dehydration, deconditioning, polypharmacy treatment  I have advised him to continue tight control of his diabetes, document glucose, keep well hydration, counteractive maneuver, moderate exercise  Chronic migraine headaches,   Has helped by Depakote ER 500 mg every night  Excedrin Migraine as needed     Marcial Pacas, M.D. Ph.D.  [facility] [address] [place], [place] [address] Ph: [phone] Fax: [phone]  CC: [name], MD

## 2015-12-07 ENCOUNTER — Ambulatory Visit (INDEPENDENT_AMBULATORY_CARE_PROVIDER_SITE_OTHER): Payer: BLUE CROSS/BLUE SHIELD | Admitting: Cardiovascular Disease

## 2015-12-07 ENCOUNTER — Encounter: Payer: Self-pay | Admitting: Cardiovascular Disease

## 2015-12-07 DIAGNOSIS — I998 Other disorder of circulatory system: Secondary | ICD-10-CM | POA: Diagnosis not present

## 2015-12-07 DIAGNOSIS — I70229 Atherosclerosis of native arteries of extremities with rest pain, unspecified extremity: Secondary | ICD-10-CM

## 2015-12-07 NOTE — Assessment & Plan Note (Signed)
[name] was referred to be by [name], his podiatrist, for evaluation and treatment of critical limb ischemia. He has a history of diabetes and hyperlipidemia. He injured his right foot approximately 2 months ago and has been seen by [name] since that time. The ulcers have spread to several toes with limited healing. He really denies claudication. I'm going to get lower extremity peripheral Doppler studies to further evaluate his circulation and potential for revascularization.

## 2015-12-07 NOTE — Progress Notes (Signed)
12/07/2015 Daniel [name] [facility]   05-09-1955  [id number]  Primary Physician [name], MD Primary Cardiologist: [name] MD Renae Gloss  HPI:  Daniel [name] is a 60 year old mildly overweight single Caucasian male who children who is currently disabled. His primary care provider is Dr. [name]. He was referred by [name], his podiatrist for peripheral vascular evaluation because of critical limb ischemia. He has a history of diabetes with diabetic peripheral neuropathy as well as hyperlipidemia. He has never had a heart attack but has had a small stroke in his pons. He apparently injured his right foot several months ago and has had nonhealing ulcers. He's had weekly visits with his podiatrist with minimal healing.   Current Outpatient Prescriptions  Medication Sig Dispense Refill  . abacavir-dolutegravir-lamiVUDine (TRIUMEQ) 600-50-300 MG tablet Take 1 tablet by mouth daily. 90 tablet 1  . acetaminophen (TYLENOL) 500 MG tablet Take 1,000 mg by mouth every 8 (eight) hours as needed for moderate pain.     [name] ALPRAZolam (XANAX) 1 MG tablet Take 1 mg by mouth 3 (three) times daily as needed for anxiety.     [name] amoxicillin-clavulanate (AUGMENTIN) 875-125 MG tablet Take 1 tablet by mouth 2 (two) times daily. 28 tablet 1  . amphetamine-dextroamphetamine (ADDERALL) 30 MG tablet Take 30 mg by mouth 3 (three) times daily.     [name] atorvastatin (LIPITOR) 10 MG tablet TAKE ONE TABLET BY MOUTH ONCE DAILY. 90 tablet 11  . Blood Glucose Monitoring Suppl (BLOOD GLUCOSE METER KIT AND SUPPLIES) Dispense based on patient and insurance preference. Use up to four times daily as directed. (FOR ICD-9 250.00, 250.01). 1 each 0  . divalproex (DEPAKOTE ER) 500 MG 24 hr tablet Take 1 tablet (500 mg total) by mouth at bedtime. 90 tablet 4  . glucose blood test strip Test blood sugar daily. Dx E11.9 50 each 11  . insulin glargine (LANTUS) 100 UNIT/ML injection Inject 60 units into skin at bedtime  (Patient taking differently: Inject 70 Units into the skin at bedtime. ) 15 mL 11  . Insulin Pen Needle (PEN NEEDLES 3/16") 31G X 5 MM MISC Use to inject Lantus daily. 100 each 3  . levothyroxine (SYNTHROID, LEVOTHROID) 50 MCG tablet Take 1 tablet (50 mcg total) by mouth at bedtime. 30 tablet 11  . LYRICA 75 MG capsule TAKE ONE CAPSULE BY MOUTH THREE TIMES DAILY. 90 capsule 0  . prochlorperazine (COMPAZINE) 10 MG tablet TAKE ONE TABLET BY MOUTH EVERY 6 HOURS AS NEEDED. (Patient taking differently: TAKE ONE TABLET BY MOUTH EVERY 6 HOURS AS NEEDED FOR NAUSEA) 120 tablet 0  . ranitidine (ZANTAC) 150 MG tablet Take 150 mg by mouth daily.    . rivaroxaban (XARELTO) 15 MG TABS tablet Take 1 tablet (15 mg total) by mouth daily. 30 tablet 11  . TRINTELLIX 20 MG TABS Take 20 mg by mouth at bedtime.    [name] UNABLE TO FIND CPAP MACHINE with standard Aclaim nasal mask with humidifier. Set at 14 cwp 1 each 0  . VOLTAREN 1 % GEL APPLY 2 GRAMS TO EACH KNEE IN THE MORNING AND AT BEDTIME AND APPLY 1 GRAM TO EACH KNEE IN THE AFTERNOON. 300 g 5  . zolpidem (AMBIEN) 10 MG tablet Take 10-20 mg by mouth at bedtime as needed for sleep.      No current facility-administered medications for this visit.     Allergies  Allergen Reactions  . Aspirin Swelling  . Ibuprofen Swelling  .  Sustiva [Efavirenz] Swelling and Rash    And rash.  . Acetaminophen     States he was told by Nephrologist not to use Tylenol due to CKD.    . Nsaids Other (See Comments)    unknwn    Social History   Social History  . Marital status: Single    Spouse name: N/A  . Number of children: N/A  . Years of education: N/A   Occupational History  . Not on file.   Social History Main Topics  . Smoking status: Former Smoker    Packs/day: 0.10    Years: 10.00    Types: Cigars, Cigarettes    Quit date: 08/09/2014  . Smokeless tobacco: Never Used  . Alcohol use No  . Drug use: No  . Sexual activity: Not Currently    Partners: Male      Comment: pt. declined condoms   Other Topics Concern  . Not on file   Social History Narrative   Epworth Sleepiness Scale = 7 (as of 03/16/2015)     Review of Systems: General: negative for chills, fever, night sweats or weight changes.  Cardiovascular: negative for chest pain, dyspnea on exertion, edema, orthopnea, palpitations, paroxysmal nocturnal dyspnea or shortness of breath Dermatological: negative for rash Respiratory: negative for cough or wheezing Urologic: negative for hematuria Abdominal: negative for nausea, vomiting, diarrhea, bright red blood per rectum, melena, or hematemesis Neurologic: negative for visual changes, syncope, or dizziness All other systems reviewed and are otherwise negative except as noted above.    Blood pressure 133/82, pulse 96, height 5' 10"  (1.778 m), weight 240 lb (108.9 kg), SpO2 95 %.  General appearance: alert and no distress Neck: no adenopathy, no carotid bruit, no JVD, supple, symmetrical, trachea midline and thyroid not enlarged, symmetric, no tenderness/mass/nodules Lungs: clear to auscultation bilaterally Heart: regular rate and rhythm, S1, S2 normal, no murmur, click, rub or gallop Extremities: extremities normal, atraumatic, no cyanosis or edema  EKG not performed today  ASSESSMENT AND PLAN:   Critical lower limb ischemia Daniel [name] was referred to be by [name], his podiatrist, for evaluation and treatment of critical limb ischemia. He has a history of diabetes and hyperlipidemia. He injured his right foot approximately 2 months ago and has been seen by [name] since that time. The ulcers have spread to several toes with limited healing. He really denies claudication. I'm going to get lower extremity peripheral Doppler studies to further evaluate his circulation and potential for revascularization.      [name] MD FACP,FACC,FAHA, [facility] 12/07/2015 11:36 AM

## 2015-12-07 NOTE — Patient Instructions (Signed)
Medication Instructions:  NO CHANGES.   Testing/Procedures: Your physician has requested that you have a lower extremity arterial doppler- During this test, ultrasound is used to evaluate arterial blood flow in the legs. Allow approximately one hour for this exam.    Follow-Up: Your physician recommends that you schedule a follow-up appointment in: AS NEEDED.

## 2015-12-10 ENCOUNTER — Encounter: Payer: Self-pay | Admitting: Family Medicine

## 2015-12-10 ENCOUNTER — Other Ambulatory Visit: Payer: Self-pay | Admitting: Cardiovascular Disease

## 2015-12-10 DIAGNOSIS — I70229 Atherosclerosis of native arteries of extremities with rest pain, unspecified extremity: Secondary | ICD-10-CM

## 2015-12-10 DIAGNOSIS — I998 Other disorder of circulatory system: Secondary | ICD-10-CM

## 2015-12-11 ENCOUNTER — Telehealth: Payer: Self-pay | Admitting: Cardiovascular Disease

## 2015-12-11 ENCOUNTER — Ambulatory Visit (INDEPENDENT_AMBULATORY_CARE_PROVIDER_SITE_OTHER): Payer: BLUE CROSS/BLUE SHIELD | Admitting: Sports Medicine

## 2015-12-11 ENCOUNTER — Other Ambulatory Visit: Payer: Self-pay | Admitting: *Deleted

## 2015-12-11 DIAGNOSIS — E1140 Type 2 diabetes mellitus with diabetic neuropathy, unspecified: Secondary | ICD-10-CM | POA: Diagnosis not present

## 2015-12-11 DIAGNOSIS — I739 Peripheral vascular disease, unspecified: Secondary | ICD-10-CM

## 2015-12-11 DIAGNOSIS — L97511 Non-pressure chronic ulcer of other part of right foot limited to breakdown of skin: Secondary | ICD-10-CM | POA: Diagnosis not present

## 2015-12-11 DIAGNOSIS — R0989 Other specified symptoms and signs involving the circulatory and respiratory systems: Secondary | ICD-10-CM

## 2015-12-11 DIAGNOSIS — E1149 Type 2 diabetes mellitus with other diabetic neurological complication: Secondary | ICD-10-CM

## 2015-12-11 DIAGNOSIS — I70229 Atherosclerosis of native arteries of extremities with rest pain, unspecified extremity: Secondary | ICD-10-CM

## 2015-12-11 DIAGNOSIS — I998 Other disorder of circulatory system: Secondary | ICD-10-CM

## 2015-12-11 NOTE — Telephone Encounter (Signed)
Patient seen 12/07/15

## 2015-12-11 NOTE — Patient Instructions (Addendum)
Wound wash

## 2015-12-11 NOTE — Progress Notes (Signed)
Subjective: Daniel [name] is a 60 y.o. male patient seen in office for evaluation of ulceration of the right toe ulcerations has been using betadine and taking antibiotics. Patient has a history of diabetes and a blood glucose level of 214.  Denies nausea/fever/vomiting/chills/night sweats/shortness of breath. Reports he saw Dr Gwenlyn Found who is planning a doppler procedure. Patient has no other pedal complaints at this time.  Patient Active Problem List   Diagnosis Date Noted  . Critical lower limb ischemia 12/07/2015  . Fall 12/05/2015  . Toe ulcer, right ([place]) 09/19/2015  . Decreased pedal pulses 09/19/2015  . OSA on CPAP 09/05/2015  . Major depressive disorder, recurrent episode, moderate ([place]) 09/05/2015  . OSA (obstructive sleep apnea) 07/25/2015  . Passed out 06/28/2015  . Low back pain 06/11/2015  . Abnormality of gait 06/11/2015  . Dizziness 03/17/2015  . Weakness 02/21/2015  . Chronic renal insufficiency, stage III (moderate) 08/09/2014  . Diabetes ([place]) 11/07/2013  . Hematuria 06/21/2013  . Hepatic steatosis 09/09/2010  . UTI 09/15/2006  . PYURIA 09/03/2006  . Human immunodeficiency virus (HIV) disease ([place]) 06/04/2006  . HERPES ZOSTER, UNCOMPLICATED 37/90/2409  . HSV 06/04/2006  . Depression 06/04/2006  . DISORDER, ATTENTION DEFICIT W/HYPERACTIVITY 06/04/2006  . THROMBOPHLEBITIS NOS 06/04/2006  . GERD 06/04/2006  . ARTHRITIS, HAND 06/04/2006  . HYPERGLYCEMIA, HX OF 06/04/2006  . HEPATITIS B, HX OF 06/04/2006   Current Outpatient Prescriptions on File Prior to Visit  Medication Sig Dispense Refill  . abacavir-dolutegravir-lamiVUDine (TRIUMEQ) 600-50-300 MG tablet Take 1 tablet by mouth daily. 90 tablet 1  . acetaminophen (TYLENOL) 500 MG tablet Take 1,000 mg by mouth every 8 (eight) hours as needed for moderate pain.     [name] ALPRAZolam (XANAX) 1 MG tablet Take 1 mg by mouth 3 (three) times daily as needed for anxiety.     [name] amoxicillin-clavulanate (AUGMENTIN) 875-125 MG  tablet Take 1 tablet by mouth 2 (two) times daily. 28 tablet 1  . amphetamine-dextroamphetamine (ADDERALL) 30 MG tablet Take 30 mg by mouth 3 (three) times daily.     [name] atorvastatin (LIPITOR) 10 MG tablet TAKE ONE TABLET BY MOUTH ONCE DAILY. 90 tablet 11  . Blood Glucose Monitoring Suppl (BLOOD GLUCOSE METER KIT AND SUPPLIES) Dispense based on patient and insurance preference. Use up to four times daily as directed. (FOR ICD-9 250.00, 250.01). 1 each 0  . divalproex (DEPAKOTE ER) 500 MG 24 hr tablet Take 1 tablet (500 mg total) by mouth at bedtime. 90 tablet 4  . glucose blood test strip Test blood sugar daily. Dx E11.9 50 each 11  . insulin glargine (LANTUS) 100 UNIT/ML injection Inject 70 Units into the skin at bedtime.    . Insulin Pen Needle (PEN NEEDLES 3/16") 31G X 5 MM MISC Use to inject Lantus daily. 100 each 3  . levothyroxine (SYNTHROID, LEVOTHROID) 50 MCG tablet Take 1 tablet (50 mcg total) by mouth at bedtime. 30 tablet 11  . LYRICA 75 MG capsule TAKE ONE CAPSULE BY MOUTH THREE TIMES DAILY. 90 capsule 0  . prochlorperazine (COMPAZINE) 10 MG tablet TAKE ONE TABLET BY MOUTH EVERY 6 HOURS AS NEEDED. (Patient taking differently: TAKE ONE TABLET BY MOUTH EVERY 6 HOURS AS NEEDED FOR NAUSEA) 120 tablet 0  . ranitidine (ZANTAC) 150 MG tablet Take 150 mg by mouth daily.    . rivaroxaban (XARELTO) 15 MG TABS tablet Take 1 tablet (15 mg total) by mouth daily. 30 tablet 11  . TRINTELLIX 20 MG TABS Take 20 mg  by mouth at bedtime.    [name] UNABLE TO FIND CPAP MACHINE with standard [employer] nasal mask with humidifier. Set at 14 cwp 1 each 0  . VOLTAREN 1 % GEL APPLY 2 GRAMS TO EACH KNEE IN THE MORNING AND AT BEDTIME AND APPLY 1 GRAM TO EACH KNEE IN THE AFTERNOON. 300 g 5  . zolpidem (AMBIEN) 10 MG tablet Take 10-20 mg by mouth at bedtime as needed for sleep.      No current facility-administered medications on file prior to visit.    Allergies  Allergen Reactions  . Aspirin Swelling  . Ibuprofen  Swelling  . Sustiva [Efavirenz] Swelling and Rash    And rash.  . Acetaminophen     States he was told by Nephrologist not to use Tylenol due to CKD.    . Nsaids Other (See Comments)    unknwn    Recent Results (from the past 2160 hour(s))  BASIC METABOLIC PANEL WITH GFR     Status: Abnormal   Collection Time: 11/12/15 12:27 PM  Result Value Ref Range   Sodium 139 135 - 146 mmol/L   Potassium 4.1 3.5 - 5.3 mmol/L   Chloride 103 98 - 110 mmol/L   CO2 27 20 - 31 mmol/L   Glucose, Bld 255 (H) 65 - 99 mg/dL   BUN 19 7 - 25 mg/dL   Creat 1.45 (H) 0.70 - 1.25 mg/dL    Comment:   For patients > or = 62 years of age: The upper reference limit for Creatinine is approximately 13% higher for people identified as African-American.      Calcium 8.7 8.6 - 10.3 mg/dL   GFR, Est African American 59 (L) >=60 mL/min   GFR, Est Non African American 51 (L) >=60 mL/min  POCT glucose (manual entry)     Status: Abnormal   Collection Time: 11/12/15 12:41 PM  Result Value Ref Range   POC Glucose 251 (A) 70 - 99 mg/dl  POCT CBC     Status: Abnormal   Collection Time: 11/12/15 12:42 PM  Result Value Ref Range   WBC 7.1 4.6 - 10.2 K/uL   Lymph, poc 2.1 0.6 - 3.4   POC LYMPH PERCENT 29.3 10 - 50 %L   MID (cbc) 0.6 0 - 0.9   POC MID % 8.5 0 - 12 %M   POC Granulocyte 4.4 2 - 6.9   Granulocyte percent 62.2 37 - 80 %G   RBC 4.85 4.69 - 6.13 M/uL   Hemoglobin 16.4 14.1 - 18.1 g/dL   HCT, POC 45.3 43.5 - 53.7 %   MCV 93.3 80 - 97 fL   MCH, POC 33.9 (A) 27 - 31.2 pg   MCHC 36.3 (A) 31.8 - 35.4 g/dL   RDW, POC 14.1 %   Platelet Count, POC 133 (A) 142 - 424 K/uL   MPV 6.2 0 - 99.8 fL  POCT glycosylated hemoglobin (Hb A1C)     Status: None   Collection Time: 11/12/15 12:47 PM  Result Value Ref Range   Hemoglobin A1C 8.2   POCT CBC     Status: Abnormal   Collection Time: 11/26/15  5:42 PM  Result Value Ref Range   WBC 7.8 4.6 - 10.2 K/uL   Lymph, poc 2.0 0.6 - 3.4   POC LYMPH PERCENT 25.9 10 -  50 %L   MID (cbc) 0.5 0 - 0.9   POC MID % 6.7 0 - 12 %M   POC Granulocyte 5.3 2 -  6.9   Granulocyte percent 67.4 37 - 80 %G   RBC 4.72 4.69 - 6.13 M/uL   Hemoglobin 15.7 14.1 - 18.1 g/dL   HCT, POC 43.5 43.5 - 53.7 %   MCV 92.1 80 - 97 fL   MCH, POC 33.2 (A) 27 - 31.2 pg   MCHC 36.0 (A) 31.8 - 35.4 g/dL   RDW, POC 13.6 %   Platelet Count, POC 161 142 - 424 K/uL   MPV 6.2 0 - 99.8 fL  POCT glucose (manual entry)     Status: Abnormal   Collection Time: 11/26/15  5:43 PM  Result Value Ref Range   POC Glucose 238 (A) 70 - 99 mg/dl  CBC with Differential     Status: Abnormal   Collection Time: 11/26/15  7:38 PM  Result Value Ref Range   WBC 8.3 4.0 - 10.5 K/uL   RBC 4.93 4.22 - 5.81 MIL/uL   Hemoglobin 16.2 13.0 - 17.0 g/dL   HCT 46.2 39.0 - 52.0 %   MCV 93.7 78.0 - 100.0 fL   MCH 32.9 26.0 - 34.0 pg   MCHC 35.1 30.0 - 36.0 g/dL   RDW 13.6 11.5 - 15.5 %   Platelets 144 (L) 150 - 400 K/uL   Neutrophils Relative % 61 %   Neutro Abs 5.0 1.7 - 7.7 K/uL   Lymphocytes Relative 28 %   Lymphs Abs 2.3 0.7 - 4.0 K/uL   Monocytes Relative 8 %   Monocytes Absolute 0.7 0.1 - 1.0 K/uL   Eosinophils Relative 3 %   Eosinophils Absolute 0.3 0.0 - 0.7 K/uL   Basophils Relative 0 %   Basophils Absolute 0.0 0.0 - 0.1 K/uL  Comprehensive metabolic panel     Status: Abnormal   Collection Time: 11/26/15  7:38 PM  Result Value Ref Range   Sodium 139 135 - 145 mmol/L   Potassium 3.9 3.5 - 5.1 mmol/L   Chloride 106 101 - 111 mmol/L   CO2 25 22 - 32 mmol/L   Glucose, Bld 190 (H) 65 - 99 mg/dL   BUN 20 6 - 20 mg/dL   Creatinine, Ser 1.55 (H) 0.61 - 1.24 mg/dL   Calcium 9.3 8.9 - 10.3 mg/dL   Total Protein 7.3 6.5 - 8.1 g/dL   Albumin 3.7 3.5 - 5.0 g/dL   AST 23 15 - 41 U/L   ALT 21 17 - 63 U/L   Alkaline Phosphatase 99 38 - 126 U/L   Total Bilirubin 0.6 0.3 - 1.2 mg/dL   GFR calc non Af Amer 46 (L) >60 mL/min   GFR calc Af Amer 54 (L) >60 mL/min    Comment: (NOTE) The eGFR has been  calculated using the CKD EPI equation. This calculation has not been validated in all clinical situations. eGFR's persistently <60 mL/min signify possible Chronic Kidney Disease.    Anion gap 8 5 - 15  Urinalysis, Routine w reflex microscopic (not at [facility])     Status: Abnormal   Collection Time: 11/26/15  9:09 PM  Result Value Ref Range   Color, Urine YELLOW YELLOW   APPearance CLEAR CLEAR   Specific Gravity, Urine 1.035 (H) 1.005 - 1.030   pH 6.5 5.0 - 8.0   Glucose, UA >1000 (A) NEGATIVE mg/dL   Hgb urine dipstick TRACE (A) NEGATIVE   Bilirubin Urine NEGATIVE NEGATIVE   Ketones, ur NEGATIVE NEGATIVE mg/dL   Protein, ur 100 (A) NEGATIVE mg/dL   Nitrite NEGATIVE NEGATIVE   Leukocytes,  UA NEGATIVE NEGATIVE  Urine microscopic-add on     Status: Abnormal   Collection Time: 11/26/15  9:09 PM  Result Value Ref Range   Squamous Epithelial / LPF 0-5 (A) NONE SEEN   WBC, UA 0-5 0 - 5 WBC/hpf   RBC / HPF 0-5 0 - 5 RBC/hpf   Bacteria, UA FEW (A) NONE SEEN   Crystals CA OXALATE CRYSTALS (A) NEGATIVE    Objective: There were no vitals filed for this visit.  General: Patient is awake, alert, oriented x 3 and in no acute distress.  Dermatology: Skin is warm and dry bilateral with a Partial thickness ulceration present Plantar right hallux and 4th toe with  ulcerations at 2nd and 3rd toes and a dry blister at right 5th toe with early necrosis and dry gangrenous changes to the toes on right foot. Ulcerations measures <0.5cm, slightly improved in appearance with less weeping, There are mixed fibro-granular tissue present with surrounding eschar and necrosis. The ulcerations do not probe to bone however close to bone on 4th toe right foot. There is no malodor, no active drainage, + focal erythema, + focal edema. No other acute signs of infection. No other open lesions.    Vascular: Dorsalis Pedis pulse = +4/4 on right, left 0/4,  Posterior Tibial pulse = 0/4 Bilateral,  Capillary Fill Time < 5  seconds. 1+ pitting edema and mild varicosities.  Neurologic: Protective sensation severely diminished to the level of the ankles with the 5.07/10g [employer].  Musculosketal: No Pain with palpation to ulcerated areas. No pain with compression to calves bilateral.  Assessment and Plan:  Problem List Items Addressed This Visit      Cardiovascular and Mediastinum   Critical lower limb ischemia    Other Visit Diagnoses    Toe ulcer, right, limited to breakdown of skin ([name])    -  Primary   Diminished pulses in lower extremity       Diabetic neuropathy with neurologic complication ([name])       PAD (peripheral artery disease) ([place])         -Examined patient and discussed the progression of the wounds and treatment alternatives. -Patient is awaiting Vascular intervention by Dr. Gwenlyn Found; Doppler arranged for 12-24-15 - Cleansed ulcerations right foot -Applied betadine and dry sterile dressing and instructed patient to continue with daily dressings at home consisting of the same covered with dry sterile dressing with care to avoid wrapping dressings to tightly around toes. -Continue with Augmentin and to pick up refill when completed  -Advised patient to go to the ER if worsens -Continue with postoperative shoe -Patient to return to office in 2 weeks after 10-30 for follow up care and evaluation or sooner if problems arise.  Landis Martins, DPM

## 2015-12-11 NOTE — Telephone Encounter (Signed)
Pt says he is confused,he said Dr Gwenlyn Found said his doppler would be scheduled in [place]. When pt got his appointment for his doppler it was scheduled here. Is this correct? He does not mind coming here,he said he was just going by what Dr Gwenlyn Found said.Please let him know if he is supposed to come here for his  doppler.

## 2015-12-11 NOTE — Addendum Note (Signed)
Addended by: Kem Boroughs D on: 12/11/2015 03:41 PM   Modules accepted: Orders

## 2015-12-11 NOTE — Telephone Encounter (Signed)
[id number]-I do see appt scheduled here.

## 2015-12-13 NOTE — Telephone Encounter (Signed)
No answer. Left message to call back.   

## 2015-12-13 NOTE — Telephone Encounter (Signed)
Forward to [employer]

## 2015-12-17 ENCOUNTER — Telehealth: Payer: Self-pay | Admitting: Cardiovascular Disease

## 2015-12-17 NOTE — Telephone Encounter (Signed)
New message ° ° °Patient returning call back to nurse.  °

## 2015-12-20 ENCOUNTER — Other Ambulatory Visit: Payer: Self-pay | Admitting: Emergency Medicine

## 2015-12-20 ENCOUNTER — Encounter: Payer: Self-pay | Admitting: Skilled Nursing Facility1

## 2015-12-20 ENCOUNTER — Ambulatory Visit: Payer: BLUE CROSS/BLUE SHIELD | Admitting: Skilled Nursing Facility1

## 2015-12-20 ENCOUNTER — Encounter: Payer: BLUE CROSS/BLUE SHIELD | Attending: Family Medicine | Admitting: Skilled Nursing Facility1

## 2015-12-20 DIAGNOSIS — Z794 Long term (current) use of insulin: Secondary | ICD-10-CM | POA: Diagnosis not present

## 2015-12-20 DIAGNOSIS — Z713 Dietary counseling and surveillance: Secondary | ICD-10-CM | POA: Diagnosis present

## 2015-12-20 DIAGNOSIS — E1140 Type 2 diabetes mellitus with diabetic neuropathy, unspecified: Secondary | ICD-10-CM | POA: Insufficient documentation

## 2015-12-20 DIAGNOSIS — E0849 Diabetes mellitus due to underlying condition with other diabetic neurological complication: Secondary | ICD-10-CM

## 2015-12-20 NOTE — Progress Notes (Signed)
Diabetes Self-Management Education  Visit Type: First/Initial  Appt. Start Time: 2:00 Appt. End Time: 3:30  12/20/2015  [name], identified by name and date of birth, is a 60 y.o. male with a diagnosis of Diabetes: Type 2.   ASSESSMENT  Height 5' 9"  (1.753 m), weight 245 lb (111.1 kg). Body mass index is 36.18 kg/m. Pt states he has recently had a foot infection. Pt states he lives with his 35 year old aunt. Pt states his aunts aid cooks for him. Pt states he is not motivated to do anything. Pt states he can only do work for about 30 minutes before his back starts to hurt. Pt states his Ambien does not work anymore. Pt states last year about this time he was 208 pounds but now he does not work. Pt states he is on disability. Pt states he does have Hepatic Steatosis and is HIV positive.      Diabetes Self-Management Education - 12/20/15 1407      Visit Information   Visit Type First/Initial     Initial Visit   Diabetes Type Type 2   Are you currently following a meal plan? No   Are you taking your medications as prescribed? Yes     Health Coping   How would you rate your overall health? Poor     Psychosocial Assessment   Patient Belief/Attitude about Diabetes Motivated to manage diabetes     Pre-Education Assessment   Patient understands the diabetes disease and treatment process. Needs Instruction   Patient understands incorporating nutritional management into lifestyle. Needs Instruction   Patient undertands incorporating physical activity into lifestyle. Needs Instruction   Patient understands using medications safely. Needs Instruction   Patient understands monitoring blood glucose, interpreting and using results Needs Instruction   Patient understands prevention, detection, and treatment of acute complications. Needs Instruction   Patient understands prevention, detection, and treatment of chronic complications. Needs Instruction   Patient understands how to  develop strategies to address psychosocial issues. Needs Instruction   Patient understands how to develop strategies to promote health/change behavior. Needs Instruction     Complications   How often do you check your blood sugar? 1-2 times/day   Fasting Blood glucose range (mg/dL) 130-179;180-200;>200   Have you had a dilated eye exam in the past 12 months? No   Have you had a dental exam in the past 12 months? Yes   Are you checking your feet? Yes   How many days per week are you checking your feet? 7     Dietary Intake   Breakfast rice crispies with added sugar   Dinner pork chop, green beans, pinto beans, rice   Snack (evening) 2 hot dogs   Beverage(s) unsweet tea, water, soda, juice      Exercise   Exercise Type ADL's     Patient Education   Previous Diabetes Education No   Disease state  Definition of diabetes, type 1 and 2, and the diagnosis of diabetes;Factors that contribute to the development of diabetes   Nutrition management  Role of diet in the treatment of diabetes and the relationship between the three main macronutrients and blood glucose level;Food label reading, portion sizes and measuring food.;Carbohydrate counting;Reviewed blood glucose goals for pre and post meals and how to evaluate the patients' food intake on their blood glucose level.;Meal timing in regards to the patients' current diabetes medication.;Information on hints to eating out and maintain blood glucose control.   Physical activity and exercise  Role of exercise on diabetes management, blood pressure control and cardiac health.;Identified with patient nutritional and/or medication changes necessary with exercise.   Monitoring Purpose and frequency of SMBG.;Yearly dilated eye exam;Daily foot exams   Acute complications Taught treatment of hypoglycemia - the 15 rule.   Chronic complications Assessed and discussed foot care and prevention of foot problems;Retinopathy and reason for yearly dilated eye  exams;Nephropathy, what it is, prevention of, the use of ACE, ARB's and early detection of through urine microalbumia.;Dental care;Lipid levels, blood glucose control and heart disease     Individualized Goals (developed by patient)   Nutrition Follow meal plan discussed;Adjust meds/carbs with exercise as discussed   Physical Activity Exercise 1-2 times per week;15 minutes per day     Post-Education Assessment   Patient understands the diabetes disease and treatment process. Demonstrates understanding / competency   Patient understands incorporating nutritional management into lifestyle. Demonstrates understanding / competency   Patient undertands incorporating physical activity into lifestyle. Demonstrates understanding / competency   Patient understands using medications safely. Demonstrates understanding / competency   Patient understands monitoring blood glucose, interpreting and using results Demonstrates understanding / competency   Patient understands prevention, detection, and treatment of acute complications. Demonstrates understanding / competency   Patient understands prevention, detection, and treatment of chronic complications. Demonstrates understanding / competency   Patient understands how to develop strategies to address psychosocial issues. Demonstrates understanding / competency   Patient understands how to develop strategies to promote health/change behavior. Demonstrates understanding / competency     Outcomes   Expected Outcomes Demonstrated interest in learning. Expect positive outcomes   Future DMSE PRN   Program Status Completed      Individualized Plan for Diabetes Self-Management Training:   Learning Objective:  Patient will have a greater understanding of diabetes self-management. Patient education plan is to attend individual and/or group sessions per assessed needs and concerns.   Plan:   Patient Instructions  -Before you eat for your blood sugar between  130-80 and 2 hours after you eat under 180  --Always bring your meter with you everywhere you go -Always Properly dispose of your needles:  -Discard in a hard plastic/metal container with a lid (something the needle can't puncture)  -Write Do Not Recycle on the outside of the container  -Example: A laundry detergent bottle -Never use the same needle more than once -Eat 2-3 carbohydrate choices for each meal and 1 for each snack -A meal: carbohydrates, protein, vegetable -A snack: A Fruit OR Vegetable AND Protein  -Try to be more active -Always pay attention to your body keeping watchful of possible low blood sugar (below 70) or high blood sugar (above 200)  -Try to be in bed by 11pm to be asleep by 12am and set your alarm to be up at 9am   Expected Outcomes:  Demonstrated interest in learning. Expect positive outcomes  Education material provided: Living Well with Diabetes, Meal plan card, My Plate and Snack sheet  If problems or questions, patient to contact team via:  Phone  Future DSME appointment: PRN

## 2015-12-20 NOTE — Patient Instructions (Addendum)
-  Before you eat for your blood sugar between 130-80 and 2 hours after you eat under 180  --Always bring your meter with you everywhere you go -Always Properly dispose of your needles:  -Discard in a hard plastic/metal container with a lid (something the needle can't puncture)  -Write Do Not Recycle on the outside of the container  -Example: A laundry detergent bottle -Never use the same needle more than once -Eat 2-3 carbohydrate choices for each meal and 1 for each snack -A meal: carbohydrates, protein, vegetable -A snack: A Fruit OR Vegetable AND Protein  -Try to be more active -Always pay attention to your body keeping watchful of possible low blood sugar (below 70) or high blood sugar (above 200)  -Try to be in bed by 11pm to be asleep by 12am and set your alarm to be up at [place] a hobby you enjoy   -Try plain greek yogurt as a substitute for sour cream

## 2015-12-20 NOTE — Telephone Encounter (Signed)
11/2015 last ov and labs

## 2015-12-22 NOTE — Telephone Encounter (Signed)
Refilled

## 2015-12-24 ENCOUNTER — Encounter (HOSPITAL_COMMUNITY): Payer: Self-pay

## 2015-12-24 ENCOUNTER — Ambulatory Visit (HOSPITAL_COMMUNITY)
Admission: RE | Admit: 2015-12-24 | Discharge: 2015-12-24 | Disposition: A | Discharge status: Home / Self Care | Payer: BLUE CROSS/BLUE SHIELD | Source: Ambulatory Visit | Attending: Cardiology | Admitting: Cardiology

## 2015-12-24 DIAGNOSIS — I998 Other disorder of circulatory system: Secondary | ICD-10-CM

## 2015-12-24 DIAGNOSIS — I70229 Atherosclerosis of native arteries of extremities with rest pain, unspecified extremity: Secondary | ICD-10-CM

## 2015-12-24 NOTE — Telephone Encounter (Signed)
The patient and the office should follow up with Dr. Kennon Holter recommendations. Per Dr. Kennon Holter last note the patient needed further studies. I'm not sure where the confusion is taking place but from my standpoint I am deferring to Dr. Gwenlyn Found to order all the tests that this patient's needs. We are trying to prevent amputation and to help these toe ulcerations with non-compressible vessels get better and the only way we will be able to do this is with assistance from vascular. This patient has an appointment with me this week. I will talk to him so that I can try and make some since as to why his studies were cancelled and then talk to Dr. Gwenlyn Found myself. Thanks [name]

## 2015-12-25 ENCOUNTER — Encounter (HOSPITAL_COMMUNITY): Payer: Self-pay | Admitting: Cardiology

## 2015-12-25 ENCOUNTER — Ambulatory Visit (INDEPENDENT_AMBULATORY_CARE_PROVIDER_SITE_OTHER): Payer: BLUE CROSS/BLUE SHIELD | Admitting: Endocrinology

## 2015-12-25 ENCOUNTER — Encounter: Payer: Self-pay | Admitting: Endocrinology

## 2015-12-25 ENCOUNTER — Ambulatory Visit (INDEPENDENT_AMBULATORY_CARE_PROVIDER_SITE_OTHER): Payer: BLUE CROSS/BLUE SHIELD | Admitting: Sports Medicine

## 2015-12-25 ENCOUNTER — Encounter: Payer: Self-pay | Admitting: Sports Medicine

## 2015-12-25 VITALS — BP 136/84 | HR 97 | Ht 70.0 in | Wt 247.0 lb

## 2015-12-25 DIAGNOSIS — I998 Other disorder of circulatory system: Secondary | ICD-10-CM | POA: Diagnosis not present

## 2015-12-25 DIAGNOSIS — I70229 Atherosclerosis of native arteries of extremities with rest pain, unspecified extremity: Secondary | ICD-10-CM

## 2015-12-25 DIAGNOSIS — I96 Gangrene, not elsewhere classified: Secondary | ICD-10-CM | POA: Diagnosis not present

## 2015-12-25 DIAGNOSIS — E1140 Type 2 diabetes mellitus with diabetic neuropathy, unspecified: Secondary | ICD-10-CM

## 2015-12-25 DIAGNOSIS — Z794 Long term (current) use of insulin: Secondary | ICD-10-CM

## 2015-12-25 DIAGNOSIS — E1149 Type 2 diabetes mellitus with other diabetic neurological complication: Secondary | ICD-10-CM | POA: Diagnosis not present

## 2015-12-25 DIAGNOSIS — R0989 Other specified symptoms and signs involving the circulatory and respiratory systems: Secondary | ICD-10-CM

## 2015-12-25 DIAGNOSIS — I739 Peripheral vascular disease, unspecified: Secondary | ICD-10-CM | POA: Diagnosis not present

## 2015-12-25 DIAGNOSIS — L97511 Non-pressure chronic ulcer of other part of right foot limited to breakdown of skin: Secondary | ICD-10-CM | POA: Diagnosis not present

## 2015-12-25 DIAGNOSIS — E0849 Diabetes mellitus due to underlying condition with other diabetic neurological complication: Secondary | ICD-10-CM | POA: Diagnosis not present

## 2015-12-25 MED ORDER — INSULIN ASPART 100 UNIT/ML FLEXPEN: PEN_INJECTOR | SUBCUTANEOUS | 11 refills | Status: DC

## 2015-12-25 MED ORDER — "PEN NEEDLES 3/16"" 31G X 5 MM MISC": 1.0000 | Freq: Four times a day (QID) | 11 refills | Status: DC

## 2015-12-25 MED ORDER — INSULIN GLARGINE 100 UNIT/ML SOLOSTAR PEN: 55.0000 [IU] | PEN_INJECTOR | Freq: Every day | SUBCUTANEOUS | 99 refills | Status: DC

## 2015-12-25 NOTE — Progress Notes (Signed)
Subjective:    Patient ID: Daniel [name], male    DOB: 01-Aug-1955, 60 y.o.   MRN: [id number]  HPI pt states DM was dx'ed in 2015; he has severe sensory neuropathy of the lower extremities; he has associated PAD, renal insufficiency, and foot ulcers (sees would care); he has been on insulin since 2016; pt says his diet and exercise are poor; he has never had pancreatitis, severe hypoglycemia or DKA.  He takes QD insulin.  This was increased to its current 70 units qd, 1 month ago.  He says cbg's vary from 129-308.  It is in general higher as the day goes on.   Past Medical History:  Diagnosis Date  . ADHD (attention deficit hyperactivity disorder)   . Anxiety   . Chronic kidney disease   . Clotting disorder ([place])   . Depression   . Diabetes mellitus without complication ([place])   . Diabetes mellitus, type II ([place])   . Dizziness 03/17/2015  . GERD (gastroesophageal reflux disease)   . HIV disease ([place])   . HIV infection ([place])   . Liver disease   . OSA (obstructive sleep apnea) 07/25/2015   Uses CPAP regularly  . Ulcer [facility])     Past Surgical History:  Procedure Laterality Date  . SMALL INTESTINE SURGERY    . STOMACH SURGERY      Social History   Social History  . Marital status: Single    Spouse name: N/A  . Number of children: N/A  . Years of education: N/A   Occupational History  . Not on file.   Social History Main Topics  . Smoking status: Former Smoker    Packs/day: 0.10    Years: 10.00    Types: Cigars, Cigarettes    Quit date: 08/09/2014  . Smokeless tobacco: Never Used  . Alcohol use No  . Drug use: No  . Sexual activity: Not Currently    Partners: Male     Comment: pt. declined condoms   Other Topics Concern  . Not on file   Social History Narrative   Epworth Sleepiness Scale = 7 (as of 03/16/2015)    Current Outpatient Prescriptions on File Prior to Visit  Medication Sig Dispense Refill  . abacavir-dolutegravir-lamiVUDine (TRIUMEQ) 600-50-300 MG  tablet Take 1 tablet by mouth daily. 90 tablet 1  . acetaminophen (TYLENOL) 500 MG tablet Take 1,000 mg by mouth every 8 (eight) hours as needed for moderate pain.     [name] ALPRAZolam (XANAX) 1 MG tablet Take 1 mg by mouth 3 (three) times daily as needed for anxiety.     [name] amphetamine-dextroamphetamine (ADDERALL) 30 MG tablet Take 30 mg by mouth 3 (three) times daily.     [name] atorvastatin (LIPITOR) 10 MG tablet TAKE ONE TABLET BY MOUTH ONCE DAILY. 90 tablet 11  . Blood Glucose Monitoring Suppl (BLOOD GLUCOSE METER KIT AND SUPPLIES) Dispense based on patient and insurance preference. Use up to four times daily as directed. (FOR ICD-9 250.00, 250.01). 1 each 0  . divalproex (DEPAKOTE ER) 500 MG 24 hr tablet Take 1 tablet (500 mg total) by mouth at bedtime. 90 tablet 4  . glucose blood test strip Test blood sugar daily. Dx E11.9 50 each 11  . levothyroxine (SYNTHROID, LEVOTHROID) 50 MCG tablet Take 1 tablet (50 mcg total) by mouth at bedtime. 30 tablet 11  . LYRICA 75 MG capsule TAKE ONE CAPSULE BY MOUTH THREE TIMES DAILY. 90 capsule 2  . prochlorperazine (COMPAZINE) 10 MG tablet  TAKE ONE TABLET BY MOUTH EVERY 6 HOURS AS NEEDED. (Patient taking differently: TAKE ONE TABLET BY MOUTH EVERY 6 HOURS AS NEEDED FOR NAUSEA) 120 tablet 0  . ranitidine (ZANTAC) 150 MG tablet Take 150 mg by mouth daily.    . rivaroxaban (XARELTO) 15 MG TABS tablet Take 1 tablet (15 mg total) by mouth daily. 30 tablet 11  . TRINTELLIX 20 MG TABS Take 20 mg by mouth at bedtime.    [name] UNABLE TO FIND CPAP MACHINE with standard Aclaim nasal mask with humidifier. Set at 14 cwp 1 each 0  . VOLTAREN 1 % GEL APPLY 2 GRAMS TO EACH KNEE IN THE MORNING AND AT BEDTIME AND APPLY 1 GRAM TO EACH KNEE IN THE AFTERNOON. 300 g 5  . zolpidem (AMBIEN) 10 MG tablet Take 10-20 mg by mouth at bedtime as needed for sleep.      No current facility-administered medications on file prior to visit.     Allergies  Allergen Reactions  . Aspirin Swelling  .  Ibuprofen Swelling  . Sustiva [Efavirenz] Swelling and Rash    And rash.  . Acetaminophen     States he was told by Nephrologist not to use Tylenol due to CKD.    . Nsaids Other (See Comments)    unknwn    Family History  Problem Relation Age of Onset  . Cancer Brother   . Depression Brother   . COPD Mother   . Diabetes Neg Hx     BP 136/84   Pulse 97   Ht _0  (1.778 m)   Wt 247 lb (112 kg)   SpO2 96%   BMI 35.44 kg/m    Review of Systems denies blurry vision, chest pain, n/v, muscle cramps, excessive diaphoresis, cold intolerance, and easy bruising.  He has gained 40 lbs, x 1 year.  He has intermittent headache, frequent urination, depression, rhinorrhea, and doe.     Objective:   Physical Exam VS: see vs page GEN: no distress HEAD: head: no deformity eyes: no periorbital swelling, no proptosis external nose and ears are normal mouth: no lesion seen NECK: supple, thyroid is not enlarged CHEST WALL: no deformity LUNGS: clear to auscultation CV: reg rate and rhythm, no murmur ABD: abdomen is soft, nontender.  no hepatosplenomegaly.  not distended.  Large self-reducing ventral hernia MUSCULOSKELETAL: muscle bulk and strength are grossly normal.  no obvious joint swelling.  gait is normal and steady EXTEMITIES: no deformity.  no ulcer on the feet.  feet are of normal color and temp.  1+ bilat leg edema.  Right foot is bandaged.  There is bilateral onychomycosis of the toenails, and dry skin on the feet.   PULSES: dorsalis pedis intact bilat.  no carotid bruit NEURO:  cn 2-12 grossly intact.   readily moves all 4's.  sensation is intact to touch on the feet, but decreased from normal. SKIN:  Normal texture and temperature.  No rash or suspicious lesion is visible.   NODES:  None palpable at the neck PSYCH: alert, well-oriented.  Does not appear anxious nor depressed.     Lab Results  Component Value Date   HGBA1C 8.2 11/12/2015    I have reviewed outside  records, and summarized: Pt was noted to have elevated a1c, and referred here. He reported that 1 unit of insulin decreases his insulin approx 5 mg%     Assessment & Plan:  Insulin-requiring type 2 DM, new to me: The pattern of his cbg's indicates  he needs multiple daily injections.    Patient is advised the following: Patient Instructions  good diet and exercise significantly improve the control of your diabetes.  please let me know if you wish to be referred to a dietician.  high blood sugar is very risky to your health.  you should see an eye doctor and dentist every year.  It is very important to get all recommended vaccinations.  Controlling your blood pressure and cholesterol drastically reduces the damage diabetes does to your body.  Those who smoke should quit.  Please discuss these with your doctor.  check your blood sugar twice a day.  vary the time of day when you check, between before the 3 meals, and at bedtime.  also check if you have symptoms of your blood sugar being too high or too low.  please keep a record of the readings and bring it to your next appointment here (or you can bring the meter itself).  You can write it on any piece of paper.  please call us sooner if your blood sugar goes below 70, or if you have a lot of readings over 200.   For now, please: Reduce the lantus to 55 units at bedtime, and:  Add novolog, 3 times a day (just before each meal) 06-28-08 units.  Please come back for a follow-up appointment in 1 month.

## 2015-12-25 NOTE — Progress Notes (Signed)
Subjective: Daniel [name] is a 60 y.o. male patient seen in office for f/u evaluation of ulcerations of the right toes has been using betadine and taking antibiotics. Patient has a history of diabetes and a blood glucose level of 200; saw endocrinologist who has changed insulin as of today. Reports that he cancelled his vascular studies because the tech said it was the same tests that he had done a few months ago.  Denies nausea/fever/vomiting/chills/night sweats/shortness of breath.  Patient has no other pedal complaints at this time.  Patient Active Problem List   Diagnosis Date Noted  . Critical lower limb ischemia 12/07/2015  . Fall 12/05/2015  . Toe ulcer, right ([place]) 09/19/2015  . Decreased pedal pulses 09/19/2015  . OSA on CPAP 09/05/2015  . Major depressive disorder, recurrent episode, moderate ([place]) 09/05/2015  . OSA (obstructive sleep apnea) 07/25/2015  . Passed out 06/28/2015  . Low back pain 06/11/2015  . Abnormality of gait 06/11/2015  . Dizziness 03/17/2015  . Weakness 02/21/2015  . Chronic renal insufficiency, stage III (moderate) 08/09/2014  . Diabetes ([place]) 11/07/2013  . Hematuria 06/21/2013  . Hepatic steatosis 09/09/2010  . UTI 09/15/2006  . PYURIA 09/03/2006  . Human immunodeficiency virus (HIV) disease ([place]) 06/04/2006  . HERPES ZOSTER, UNCOMPLICATED 48/18/5631  . HSV 06/04/2006  . Depression 06/04/2006  . DISORDER, ATTENTION DEFICIT W/HYPERACTIVITY 06/04/2006  . THROMBOPHLEBITIS NOS 06/04/2006  . GERD 06/04/2006  . ARTHRITIS, HAND 06/04/2006  . HYPERGLYCEMIA, HX OF 06/04/2006  . HEPATITIS B, HX OF 06/04/2006   Current Outpatient Prescriptions on File Prior to Visit  Medication Sig Dispense Refill  . abacavir-dolutegravir-lamiVUDine (TRIUMEQ) 600-50-300 MG tablet Take 1 tablet by mouth daily. 90 tablet 1  . acetaminophen (TYLENOL) 500 MG tablet Take 1,000 mg by mouth every 8 (eight) hours as needed for moderate pain.     [name] ALPRAZolam (XANAX) 1 MG tablet  Take 1 mg by mouth 3 (three) times daily as needed for anxiety.     [name] amphetamine-dextroamphetamine (ADDERALL) 30 MG tablet Take 30 mg by mouth 3 (three) times daily.     [name] atorvastatin (LIPITOR) 10 MG tablet TAKE ONE TABLET BY MOUTH ONCE DAILY. 90 tablet 11  . Blood Glucose Monitoring Suppl (BLOOD GLUCOSE METER KIT AND SUPPLIES) Dispense based on patient and insurance preference. Use up to four times daily as directed. (FOR ICD-9 250.00, 250.01). 1 each 0  . divalproex (DEPAKOTE ER) 500 MG 24 hr tablet Take 1 tablet (500 mg total) by mouth at bedtime. 90 tablet 4  . glucose blood test strip Test blood sugar daily. Dx E11.9 50 each 11  . insulin aspart (NOVOLOG FLEXPEN) 100 UNIT/ML FlexPen 3 times a day (just before each meal) 06-28-08 units 15 mL 11  . Insulin Glargine (LANTUS SOLOSTAR) 100 UNIT/ML Solostar Pen Inject 55 Units into the skin at bedtime. 10 pen PRN  . Insulin Pen Needle (PEN NEEDLES 3/16") 31G X 5 MM MISC 1 Device by Other route 4 (four) times daily. Use to inject Lantus daily. 120 each 11  . levothyroxine (SYNTHROID, LEVOTHROID) 50 MCG tablet Take 1 tablet (50 mcg total) by mouth at bedtime. 30 tablet 11  . LYRICA 75 MG capsule TAKE ONE CAPSULE BY MOUTH THREE TIMES DAILY. 90 capsule 2  . prochlorperazine (COMPAZINE) 10 MG tablet TAKE ONE TABLET BY MOUTH EVERY 6 HOURS AS NEEDED. (Patient taking differently: TAKE ONE TABLET BY MOUTH EVERY 6 HOURS AS NEEDED FOR NAUSEA) 120 tablet 0  . ranitidine (ZANTAC) 150 MG  tablet Take 150 mg by mouth daily.    . rivaroxaban (XARELTO) 15 MG TABS tablet Take 1 tablet (15 mg total) by mouth daily. 30 tablet 11  . TRINTELLIX 20 MG TABS Take 20 mg by mouth at bedtime.    [name] UNABLE TO FIND CPAP MACHINE with standard Aclaim nasal mask with humidifier. Set at 14 cwp 1 each 0  . VOLTAREN 1 % GEL APPLY 2 GRAMS TO EACH KNEE IN THE MORNING AND AT BEDTIME AND APPLY 1 GRAM TO EACH KNEE IN THE AFTERNOON. 300 g 5  . zolpidem (AMBIEN) 10 MG tablet Take 10-20 mg by  mouth at bedtime as needed for sleep.      No current facility-administered medications on file prior to visit.    Allergies  Allergen Reactions  . Aspirin Swelling  . Ibuprofen Swelling  . Sustiva [Efavirenz] Swelling and Rash    And rash.  . Acetaminophen     States he was told by Nephrologist not to use Tylenol due to CKD.    . Nsaids Other (See Comments)    unknwn    Recent Results (from the past 2160 hour(s))  BASIC METABOLIC PANEL WITH GFR     Status: Abnormal   Collection Time: 11/12/15 12:27 PM  Result Value Ref Range   Sodium 139 135 - 146 mmol/L   Potassium 4.1 3.5 - 5.3 mmol/L   Chloride 103 98 - 110 mmol/L   CO2 27 20 - 31 mmol/L   Glucose, Bld 255 (H) 65 - 99 mg/dL   BUN 19 7 - 25 mg/dL   Creat 1.45 (H) 0.70 - 1.25 mg/dL    Comment:   For patients > or = 62 years of age: The upper reference limit for Creatinine is approximately 13% higher for people identified as African-American.      Calcium 8.7 8.6 - 10.3 mg/dL   GFR, Est African American 59 (L) >=60 mL/min   GFR, Est Non African American 51 (L) >=60 mL/min  POCT glucose (manual entry)     Status: Abnormal   Collection Time: 11/12/15 12:41 PM  Result Value Ref Range   POC Glucose 251 (A) 70 - 99 mg/dl  POCT CBC     Status: Abnormal   Collection Time: 11/12/15 12:42 PM  Result Value Ref Range   WBC 7.1 4.6 - 10.2 K/uL   Lymph, poc 2.1 0.6 - 3.4   POC LYMPH PERCENT 29.3 10 - 50 %L   MID (cbc) 0.6 0 - 0.9   POC MID % 8.5 0 - 12 %M   POC Granulocyte 4.4 2 - 6.9   Granulocyte percent 62.2 37 - 80 %G   RBC 4.85 4.69 - 6.13 M/uL   Hemoglobin 16.4 14.1 - 18.1 g/dL   HCT, POC 45.3 43.5 - 53.7 %   MCV 93.3 80 - 97 fL   MCH, POC 33.9 (A) 27 - 31.2 pg   MCHC 36.3 (A) 31.8 - 35.4 g/dL   RDW, POC 14.1 %   Platelet Count, POC 133 (A) 142 - 424 K/uL   MPV 6.2 0 - 99.8 fL  POCT glycosylated hemoglobin (Hb A1C)     Status: None   Collection Time: 11/12/15 12:47 PM  Result Value Ref Range   Hemoglobin A1C  8.2   POCT CBC     Status: Abnormal   Collection Time: 11/26/15  5:42 PM  Result Value Ref Range   WBC 7.8 4.6 - 10.2 K/uL   Lymph, poc  2.0 0.6 - 3.4   POC LYMPH PERCENT 25.9 10 - 50 %L   MID (cbc) 0.5 0 - 0.9   POC MID % 6.7 0 - 12 %M   POC Granulocyte 5.3 2 - 6.9   Granulocyte percent 67.4 37 - 80 %G   RBC 4.72 4.69 - 6.13 M/uL   Hemoglobin 15.7 14.1 - 18.1 g/dL   HCT, POC 43.5 43.5 - 53.7 %   MCV 92.1 80 - 97 fL   MCH, POC 33.2 (A) 27 - 31.2 pg   MCHC 36.0 (A) 31.8 - 35.4 g/dL   RDW, POC 13.6 %   Platelet Count, POC 161 142 - 424 K/uL   MPV 6.2 0 - 99.8 fL  POCT glucose (manual entry)     Status: Abnormal   Collection Time: 11/26/15  5:43 PM  Result Value Ref Range   POC Glucose 238 (A) 70 - 99 mg/dl  CBC with Differential     Status: Abnormal   Collection Time: 11/26/15  7:38 PM  Result Value Ref Range   WBC 8.3 4.0 - 10.5 K/uL   RBC 4.93 4.22 - 5.81 MIL/uL   Hemoglobin 16.2 13.0 - 17.0 g/dL   HCT 46.2 39.0 - 52.0 %   MCV 93.7 78.0 - 100.0 fL   MCH 32.9 26.0 - 34.0 pg   MCHC 35.1 30.0 - 36.0 g/dL   RDW 13.6 11.5 - 15.5 %   Platelets 144 (L) 150 - 400 K/uL   Neutrophils Relative % 61 %   Neutro Abs 5.0 1.7 - 7.7 K/uL   Lymphocytes Relative 28 %   Lymphs Abs 2.3 0.7 - 4.0 K/uL   Monocytes Relative 8 %   Monocytes Absolute 0.7 0.1 - 1.0 K/uL   Eosinophils Relative 3 %   Eosinophils Absolute 0.3 0.0 - 0.7 K/uL   Basophils Relative 0 %   Basophils Absolute 0.0 0.0 - 0.1 K/uL  Comprehensive metabolic panel     Status: Abnormal   Collection Time: 11/26/15  7:38 PM  Result Value Ref Range   Sodium 139 135 - 145 mmol/L   Potassium 3.9 3.5 - 5.1 mmol/L   Chloride 106 101 - 111 mmol/L   CO2 25 22 - 32 mmol/L   Glucose, Bld 190 (H) 65 - 99 mg/dL   BUN 20 6 - 20 mg/dL   Creatinine, Ser 1.55 (H) 0.61 - 1.24 mg/dL   Calcium 9.3 8.9 - 10.3 mg/dL   Total Protein 7.3 6.5 - 8.1 g/dL   Albumin 3.7 3.5 - 5.0 g/dL   AST 23 15 - 41 U/L   ALT 21 17 - 63 U/L   Alkaline  Phosphatase 99 38 - 126 U/L   Total Bilirubin 0.6 0.3 - 1.2 mg/dL   GFR calc non Af Amer 46 (L) >60 mL/min   GFR calc Af Amer 54 (L) >60 mL/min    Comment: (NOTE) The eGFR has been calculated using the CKD EPI equation. This calculation has not been validated in all clinical situations. eGFR's persistently <60 mL/min signify possible Chronic Kidney Disease.    Anion gap 8 5 - 15  Urinalysis, Routine w reflex microscopic (not at [facility])     Status: Abnormal   Collection Time: 11/26/15  9:09 PM  Result Value Ref Range   Color, Urine YELLOW YELLOW   APPearance CLEAR CLEAR   Specific Gravity, Urine 1.035 (H) 1.005 - 1.030   pH 6.5 5.0 - 8.0   Glucose, UA >1000 (A)  NEGATIVE mg/dL   Hgb urine dipstick TRACE (A) NEGATIVE   Bilirubin Urine NEGATIVE NEGATIVE   Ketones, ur NEGATIVE NEGATIVE mg/dL   Protein, ur 100 (A) NEGATIVE mg/dL   Nitrite NEGATIVE NEGATIVE   Leukocytes, UA NEGATIVE NEGATIVE  Urine microscopic-add on     Status: Abnormal   Collection Time: 11/26/15  9:09 PM  Result Value Ref Range   Squamous Epithelial / LPF 0-5 (A) NONE SEEN   WBC, UA 0-5 0 - 5 WBC/hpf   RBC / HPF 0-5 0 - 5 RBC/hpf   Bacteria, UA FEW (A) NONE SEEN   Crystals CA OXALATE CRYSTALS (A) NEGATIVE    Objective: There were no vitals filed for this visit.  General: Patient is awake, alert, oriented x 3 and in no acute distress.  Dermatology: Skin is warm and dry bilateral with a Partial thickness ulceration present Plantar right hallux and 4th toe with  ulcerations at 2nd and 3rd toes and a dry blister at right 5th toe with early necrosis and dry gangrenous changes to the toes on right foot. Ulcerations measures <0.5cm, slightly improved in appearance with less weeping since last encounter, There are mixed fibro-granular tissue present with surrounding eschar and necrosis. The ulcerations do not probe to bone however close to bone on 4th toe right foot. There is no malodor, no active drainage, + focal  erythema, + focal edema. No other acute signs of infection. No other open lesions.    Vascular: Dorsalis Pedis pulse = +4/4 on right, left 0/4,  Posterior Tibial pulse = 0/4 Bilateral,  Capillary Fill Time < 5 seconds. 1+ pitting edema and mild varicosities.  Neurologic: Protective sensation severely diminished to the level of the ankles with the 5.07/10g [employer].  Musculosketal: No Pain with palpation to ulcerated areas. No pain with compression to calves bilateral.  Assessment and Plan:  Problem List Items Addressed This Visit      Cardiovascular and Mediastinum   Critical lower limb ischemia    Other Visit Diagnoses    Toe ulcer, right, limited to breakdown of skin ([name])    -  Primary   Diminished pulses in lower extremity       PAD (peripheral artery disease) ([name])       Diabetic neuropathy with neurologic complication ([place])       Gangrene ([place])         -Examined patient and discussed the progression of the wounds and treatment alternatives. -Chart message sent to Dr. Gwenlyn Found; Patient cancelled vascular test because was the same as a few months ago; encouraged patient to follow up with vascular doctor recommendations because if he fails to do so may risk losing his foot - Cleansed ulcerations right foot and applied betadine and dry sterile dressing and instructed patient to continue with daily dressings at home consisting of the same covered with dry sterile dressing with care to avoid wrapping dressings to tightly around toes. -Continue with Augmentin with refill as needed  -Advised patient to go to the ER if worsens -Continue with postoperative shoe -Patient to return to office in 2 weeks for follow up ulcer care and evaluation or sooner if problems arise.  Landis Martins, DPM

## 2015-12-25 NOTE — Patient Instructions (Addendum)
good diet and exercise significantly improve the control of your diabetes.  please let me know if you wish to be referred to a dietician.  high blood sugar is very risky to your health.  you should see an eye doctor and dentist every year.  It is very important to get all recommended vaccinations.  Controlling your blood pressure and cholesterol drastically reduces the damage diabetes does to your body.  Those who smoke should quit.  Please discuss these with your doctor.  check your blood sugar twice a day.  vary the time of day when you check, between before the 3 meals, and at bedtime.  also check if you have symptoms of your blood sugar being too high or too low.  please keep a record of the readings and bring it to your next appointment here (or you can bring the meter itself).  You can write it on any piece of paper.  please call us sooner if your blood sugar goes below 70, or if you have a lot of readings over 200.   For now, please: Reduce the lantus to 55 units at bedtime, and:  Add novolog, 3 times a day (just before each meal) 06-28-08 units.  Please come back for a follow-up appointment in 1 month.

## 2015-12-25 NOTE — Progress Notes (Signed)
Patient arrived for LEA study on 12/24/15.  He states the ulcers on his foot are improving.  He just had LEA Doppler/Duplex at [facility] on 09/28/15 (report in Epic), and refuses having the same tests repeated in just two months' time.  He stated that Dr. Gwenlyn Found said he had reviewed those tests, but what he had ordered would be different.  Katharine Look explained the tests that would be performed that might add to the information from August (duplex of aorta and common iliac arteries, plus PPG of all ten toes).  Patient still refuses study, and does not want to have the same thing done over, and to have a bill if insurance doesn't cover. Katharine Look cancelled study from the schedule, and unlinked the order. Study was not performed secondary to patient refusal.

## 2016-01-08 ENCOUNTER — Encounter: Payer: Self-pay | Admitting: Endocrinology

## 2016-01-08 ENCOUNTER — Ambulatory Visit (INDEPENDENT_AMBULATORY_CARE_PROVIDER_SITE_OTHER): Payer: BLUE CROSS/BLUE SHIELD | Admitting: Sports Medicine

## 2016-01-08 ENCOUNTER — Encounter: Payer: Self-pay | Admitting: Sports Medicine

## 2016-01-08 DIAGNOSIS — I70229 Atherosclerosis of native arteries of extremities with rest pain, unspecified extremity: Secondary | ICD-10-CM

## 2016-01-08 DIAGNOSIS — L97511 Non-pressure chronic ulcer of other part of right foot limited to breakdown of skin: Secondary | ICD-10-CM

## 2016-01-08 DIAGNOSIS — I96 Gangrene, not elsewhere classified: Secondary | ICD-10-CM

## 2016-01-08 DIAGNOSIS — E1149 Type 2 diabetes mellitus with other diabetic neurological complication: Secondary | ICD-10-CM

## 2016-01-08 DIAGNOSIS — I998 Other disorder of circulatory system: Secondary | ICD-10-CM

## 2016-01-08 DIAGNOSIS — E1140 Type 2 diabetes mellitus with diabetic neuropathy, unspecified: Secondary | ICD-10-CM

## 2016-01-08 DIAGNOSIS — I739 Peripheral vascular disease, unspecified: Secondary | ICD-10-CM

## 2016-01-08 DIAGNOSIS — R0989 Other specified symptoms and signs involving the circulatory and respiratory systems: Secondary | ICD-10-CM

## 2016-01-08 LAB — HM DIABETES EYE EXAM

## 2016-01-08 NOTE — Progress Notes (Signed)
Subjective: Daniel [name] is a 60 y.o. male patient seen in office for f/u evaluation of ulcerations of the right toes has been using betadine and taking antibiotics. Patient has a history of diabetes and a blood glucose level of 200; saw endocrinologist who has changed insulin as of today. Reports that he cancelled his vascular studies because the tech said it was the same tests that he had done a few months ago.  Denies nausea/fever/vomiting/chills/night sweats/shortness of breath.  Patient has no other pedal complaints at this time.  Patient Active Problem List   Diagnosis Date Noted  . Critical lower limb ischemia 12/07/2015  . Fall 12/05/2015  . Toe ulcer, right ([place]) 09/19/2015  . Decreased pedal pulses 09/19/2015  . OSA on CPAP 09/05/2015  . Major depressive disorder, recurrent episode, moderate ([place]) 09/05/2015  . OSA (obstructive sleep apnea) 07/25/2015  . Passed out 06/28/2015  . Low back pain 06/11/2015  . Abnormality of gait 06/11/2015  . Dizziness 03/17/2015  . Weakness 02/21/2015  . Chronic renal insufficiency, stage III (moderate) 08/09/2014  . Diabetes ([place]) 11/07/2013  . Hematuria 06/21/2013  . Hepatic steatosis 09/09/2010  . UTI 09/15/2006  . PYURIA 09/03/2006  . Human immunodeficiency virus (HIV) disease ([place]) 06/04/2006  . HERPES ZOSTER, UNCOMPLICATED 48/18/5631  . HSV 06/04/2006  . Depression 06/04/2006  . DISORDER, ATTENTION DEFICIT W/HYPERACTIVITY 06/04/2006  . THROMBOPHLEBITIS NOS 06/04/2006  . GERD 06/04/2006  . ARTHRITIS, HAND 06/04/2006  . HYPERGLYCEMIA, HX OF 06/04/2006  . HEPATITIS B, HX OF 06/04/2006   Current Outpatient Prescriptions on File Prior to Visit  Medication Sig Dispense Refill  . abacavir-dolutegravir-lamiVUDine (TRIUMEQ) 600-50-300 MG tablet Take 1 tablet by mouth daily. 90 tablet 1  . acetaminophen (TYLENOL) 500 MG tablet Take 1,000 mg by mouth every 8 (eight) hours as needed for moderate pain.     [name] ALPRAZolam (XANAX) 1 MG tablet  Take 1 mg by mouth 3 (three) times daily as needed for anxiety.     [name] amphetamine-dextroamphetamine (ADDERALL) 30 MG tablet Take 30 mg by mouth 3 (three) times daily.     [name] atorvastatin (LIPITOR) 10 MG tablet TAKE ONE TABLET BY MOUTH ONCE DAILY. 90 tablet 11  . Blood Glucose Monitoring Suppl (BLOOD GLUCOSE METER KIT AND SUPPLIES) Dispense based on patient and insurance preference. Use up to four times daily as directed. (FOR ICD-9 250.00, 250.01). 1 each 0  . divalproex (DEPAKOTE ER) 500 MG 24 hr tablet Take 1 tablet (500 mg total) by mouth at bedtime. 90 tablet 4  . glucose blood test strip Test blood sugar daily. Dx E11.9 50 each 11  . insulin aspart (NOVOLOG FLEXPEN) 100 UNIT/ML FlexPen 3 times a day (just before each meal) 06-28-08 units 15 mL 11  . Insulin Glargine (LANTUS SOLOSTAR) 100 UNIT/ML Solostar Pen Inject 55 Units into the skin at bedtime. 10 pen PRN  . Insulin Pen Needle (PEN NEEDLES 3/16") 31G X 5 MM MISC 1 Device by Other route 4 (four) times daily. Use to inject Lantus daily. 120 each 11  . levothyroxine (SYNTHROID, LEVOTHROID) 50 MCG tablet Take 1 tablet (50 mcg total) by mouth at bedtime. 30 tablet 11  . LYRICA 75 MG capsule TAKE ONE CAPSULE BY MOUTH THREE TIMES DAILY. 90 capsule 2  . prochlorperazine (COMPAZINE) 10 MG tablet TAKE ONE TABLET BY MOUTH EVERY 6 HOURS AS NEEDED. (Patient taking differently: TAKE ONE TABLET BY MOUTH EVERY 6 HOURS AS NEEDED FOR NAUSEA) 120 tablet 0  . ranitidine (ZANTAC) 150 MG  tablet Take 150 mg by mouth daily.    . rivaroxaban (XARELTO) 15 MG TABS tablet Take 1 tablet (15 mg total) by mouth daily. 30 tablet 11  . TRINTELLIX 20 MG TABS Take 20 mg by mouth at bedtime.    [name] UNABLE TO FIND CPAP MACHINE with standard [employer]claim nasal mask with humidifier. Set at 14 cwp 1 each 0  . VOLTAREN 1 % GEL APPLY 2 GRAMS TO EACH KNEE IN THE MORNING AND AT BEDTIME AND APPLY 1 GRAM TO EACH KNEE IN THE AFTERNOON. 300 g 5  . zolpidem (AMBIEN) 10 MG tablet Take 10-20 mg by  mouth at bedtime as needed for sleep.      No current facility-administered medications on file prior to visit.    Allergies  Allergen Reactions  . Aspirin Swelling  . Ibuprofen Swelling  . Sustiva [Efavirenz] Swelling and Rash    And rash.  . Acetaminophen Other (See Comments)    States he was told by Nephrologist not to use Tylenol due to CKD.   States he was told by Nephrologist not to use Tylenol due to CKD.    . Nsaids Other (See Comments)    unknwn    Recent Results (from the past 2160 hour(s))  BASIC METABOLIC PANEL WITH GFR     Status: Abnormal   Collection Time: 11/12/15 12:27 PM  Result Value Ref Range   Sodium 139 135 - 146 mmol/L   Potassium 4.1 3.5 - 5.3 mmol/L   Chloride 103 98 - 110 mmol/L   CO2 27 20 - 31 mmol/L   Glucose, Bld 255 (H) 65 - 99 mg/dL   BUN 19 7 - 25 mg/dL   Creat 1.45 (H) 0.70 - 1.25 mg/dL    Comment:   For patients > or = 62 years of age: The upper reference limit for Creatinine is approximately 13% higher for people identified as African-American.      Calcium 8.7 8.6 - 10.3 mg/dL   GFR, Est African American 59 (L) >=60 mL/min   GFR, Est Non African American 51 (L) >=60 mL/min  POCT glucose (manual entry)     Status: Abnormal   Collection Time: 11/12/15 12:41 PM  Result Value Ref Range   POC Glucose 251 (A) 70 - 99 mg/dl  POCT CBC     Status: Abnormal   Collection Time: 11/12/15 12:42 PM  Result Value Ref Range   WBC 7.1 4.6 - 10.2 K/uL   Lymph, poc 2.1 0.6 - 3.4   POC LYMPH PERCENT 29.3 10 - 50 %L   MID (cbc) 0.6 0 - 0.9   POC MID % 8.5 0 - 12 %M   POC Granulocyte 4.4 2 - 6.9   Granulocyte percent 62.2 37 - 80 %G   RBC 4.85 4.69 - 6.13 M/uL   Hemoglobin 16.4 14.1 - 18.1 g/dL   HCT, POC 45.3 43.5 - 53.7 %   MCV 93.3 80 - 97 fL   MCH, POC 33.9 (A) 27 - 31.2 pg   MCHC 36.3 (A) 31.8 - 35.4 g/dL   RDW, POC 14.1 %   Platelet Count, POC 133 (A) 142 - 424 K/uL   MPV 6.2 0 - 99.8 fL  POCT glycosylated hemoglobin (Hb A1C)     Status:  None   Collection Time: 11/12/15 12:47 PM  Result Value Ref Range   Hemoglobin A1C 8.2   POCT CBC     Status: Abnormal   Collection Time: 11/26/15  5:42 PM  Result Value Ref Range   WBC 7.8 4.6 - 10.2 K/uL   Lymph, poc 2.0 0.6 - 3.4   POC LYMPH PERCENT 25.9 10 - 50 %L   MID (cbc) 0.5 0 - 0.9   POC MID % 6.7 0 - 12 %M   POC Granulocyte 5.3 2 - 6.9   Granulocyte percent 67.4 37 - 80 %G   RBC 4.72 4.69 - 6.13 M/uL   Hemoglobin 15.7 14.1 - 18.1 g/dL   HCT, POC 43.5 43.5 - 53.7 %   MCV 92.1 80 - 97 fL   MCH, POC 33.2 (A) 27 - 31.2 pg   MCHC 36.0 (A) 31.8 - 35.4 g/dL   RDW, POC 13.6 %   Platelet Count, POC 161 142 - 424 K/uL   MPV 6.2 0 - 99.8 fL  POCT glucose (manual entry)     Status: Abnormal   Collection Time: 11/26/15  5:43 PM  Result Value Ref Range   POC Glucose 238 (A) 70 - 99 mg/dl  CBC with Differential     Status: Abnormal   Collection Time: 11/26/15  7:38 PM  Result Value Ref Range   WBC 8.3 4.0 - 10.5 K/uL   RBC 4.93 4.22 - 5.81 MIL/uL   Hemoglobin 16.2 13.0 - 17.0 g/dL   HCT 46.2 39.0 - 52.0 %   MCV 93.7 78.0 - 100.0 fL   MCH 32.9 26.0 - 34.0 pg   MCHC 35.1 30.0 - 36.0 g/dL   RDW 13.6 11.5 - 15.5 %   Platelets 144 (L) 150 - 400 K/uL   Neutrophils Relative % 61 %   Neutro Abs 5.0 1.7 - 7.7 K/uL   Lymphocytes Relative 28 %   Lymphs Abs 2.3 0.7 - 4.0 K/uL   Monocytes Relative 8 %   Monocytes Absolute 0.7 0.1 - 1.0 K/uL   Eosinophils Relative 3 %   Eosinophils Absolute 0.3 0.0 - 0.7 K/uL   Basophils Relative 0 %   Basophils Absolute 0.0 0.0 - 0.1 K/uL  Comprehensive metabolic panel     Status: Abnormal   Collection Time: 11/26/15  7:38 PM  Result Value Ref Range   Sodium 139 135 - 145 mmol/L   Potassium 3.9 3.5 - 5.1 mmol/L   Chloride 106 101 - 111 mmol/L   CO2 25 22 - 32 mmol/L   Glucose, Bld 190 (H) 65 - 99 mg/dL   BUN 20 6 - 20 mg/dL   Creatinine, Ser 1.55 (H) 0.61 - 1.24 mg/dL   Calcium 9.3 8.9 - 10.3 mg/dL   Total Protein 7.3 6.5 - 8.1 g/dL    Albumin 3.7 3.5 - 5.0 g/dL   AST 23 15 - 41 U/L   ALT 21 17 - 63 U/L   Alkaline Phosphatase 99 38 - 126 U/L   Total Bilirubin 0.6 0.3 - 1.2 mg/dL   GFR calc non Af Amer 46 (L) >60 mL/min   GFR calc Af Amer 54 (L) >60 mL/min    Comment: (NOTE) The eGFR has been calculated using the CKD EPI equation. This calculation has not been validated in all clinical situations. eGFR's persistently <60 mL/min signify possible Chronic Kidney Disease.    Anion gap 8 5 - 15  Urinalysis, Routine w reflex microscopic (not at [facility])     Status: Abnormal   Collection Time: 11/26/15  9:09 PM  Result Value Ref Range   Color, Urine YELLOW YELLOW   APPearance CLEAR CLEAR   Specific Gravity, Urine 1.035 (H)  1.005 - 1.030   pH 6.5 5.0 - 8.0   Glucose, UA >1000 (A) NEGATIVE mg/dL   Hgb urine dipstick TRACE (A) NEGATIVE   Bilirubin Urine NEGATIVE NEGATIVE   Ketones, ur NEGATIVE NEGATIVE mg/dL   Protein, ur 100 (A) NEGATIVE mg/dL   Nitrite NEGATIVE NEGATIVE   Leukocytes, UA NEGATIVE NEGATIVE  Urine microscopic-add on     Status: Abnormal   Collection Time: 11/26/15  9:09 PM  Result Value Ref Range   Squamous Epithelial / LPF 0-5 (A) NONE SEEN   WBC, UA 0-5 0 - 5 WBC/hpf   RBC / HPF 0-5 0 - 5 RBC/hpf   Bacteria, UA FEW (A) NONE SEEN   Crystals CA OXALATE CRYSTALS (A) NEGATIVE    Objective: There were no vitals filed for this visit.  General: Patient is awake, alert, oriented x 3 and in no acute distress.  Dermatology: Skin is warm and dry bilateral with a Partial thickness ulceration present Plantar right hallux, medial 2nd toe, lateral 3rd toe and 4th toes with multiple digital eschars with early necrosis and dry localized gangrenous changes to the toes on right foot. Ulcerations measures <0.5cm, slightly improved in appearance with less weeping since last encounter, There are mixed fibro-granular tissue present with surrounding eschar and necrosis. The ulcerations do not probe to bone however close to  bone on 3rd and 4th toes right foot. There is no malodor, no active drainage, + focal erythema, + focal edema. No other acute signs of infection. No other open lesions.    Vascular: Dorsalis Pedis pulse = +4/4 on right, left 0/4,  Posterior Tibial pulse = 0/4 Bilateral,  Capillary Fill Time < 5 seconds. 1+ pitting edema and mild varicosities.  Neurologic: Protective sensation severely diminished to the level of the ankles with the 5.07/10g [employer].  Musculosketal: No Pain with palpation to ulcerated areas. No pain with compression to calves bilateral.  Assessment and Plan:  Problem List Items Addressed This Visit      Cardiovascular and Mediastinum   Critical lower limb ischemia    Other Visit Diagnoses    Toe ulcer, right, limited to breakdown of skin ([name])    -  Primary   Diminished pulses in lower extremity       PAD (peripheral artery disease) ([name])       Diabetic neuropathy with neurologic complication ([place])       Gangrene ([place])         -Examined patient and discussed the progression of the wounds and treatment alternatives. -Patient is awaiting vascular follow up; states that he will call office back because he thinks he may have a missed called from Dr. Kennon Holter office; patient states that if he has difficulty may would like to see another vascular doctor; I advised patient to call office if he would like a new referral to a different doctor after he has checked his messages -Debrided eschars using tissue nipper on right foot and applied betadine and dry sterile dressing and instructed patient to continue with daily dressings at home consisting of the same covered with dry sterile dressing with care to avoid wrapping dressings to tightly around toes. -Augmentin completed with refill as needed  -Advised patient to go to the ER if worsens -Continue with postoperative shoe -Patient to return to office in 2 weeks for follow up ulcer care and evaluation or sooner if  problems arise.  Landis Martins, DPM

## 2016-01-09 ENCOUNTER — Telehealth: Payer: Self-pay | Admitting: *Deleted

## 2016-01-11 NOTE — Telephone Encounter (Signed)
Pt states he thinks he took the [facility] number down wrong. I told pt he probably had difficulty understanding me with my cold and gave him [phone].

## 2016-01-18 ENCOUNTER — Other Ambulatory Visit: Payer: Self-pay | Admitting: Internal Medicine

## 2016-01-18 ENCOUNTER — Other Ambulatory Visit: Payer: Self-pay | Admitting: Sports Medicine

## 2016-01-22 ENCOUNTER — Ambulatory Visit (INDEPENDENT_AMBULATORY_CARE_PROVIDER_SITE_OTHER): Payer: BLUE CROSS/BLUE SHIELD | Admitting: Sports Medicine

## 2016-01-22 ENCOUNTER — Encounter: Payer: Self-pay | Admitting: Sports Medicine

## 2016-01-22 DIAGNOSIS — I70229 Atherosclerosis of native arteries of extremities with rest pain, unspecified extremity: Secondary | ICD-10-CM

## 2016-01-22 DIAGNOSIS — L97511 Non-pressure chronic ulcer of other part of right foot limited to breakdown of skin: Secondary | ICD-10-CM | POA: Diagnosis not present

## 2016-01-22 DIAGNOSIS — E1140 Type 2 diabetes mellitus with diabetic neuropathy, unspecified: Secondary | ICD-10-CM

## 2016-01-22 DIAGNOSIS — E1149 Type 2 diabetes mellitus with other diabetic neurological complication: Secondary | ICD-10-CM

## 2016-01-22 DIAGNOSIS — I739 Peripheral vascular disease, unspecified: Secondary | ICD-10-CM

## 2016-01-22 DIAGNOSIS — I998 Other disorder of circulatory system: Secondary | ICD-10-CM

## 2016-01-22 DIAGNOSIS — R0989 Other specified symptoms and signs involving the circulatory and respiratory systems: Secondary | ICD-10-CM

## 2016-01-22 NOTE — Progress Notes (Signed)
Subjective: Daniel [name] is a 60 y.o. male patient seen in office for f/u evaluation of ulcerations of the right toes has been using betadine and taking antibiotics. Patient has a history of diabetes and a blood glucose level of 400s; states has a new endocrinologist. Denies nausea/fever/vomiting/chills/night sweats/shortness of breath.  Patient has no other pedal complaints at this time.  Patient Active Problem List   Diagnosis Date Noted  . Critical lower limb ischemia 12/07/2015  . Fall 12/05/2015  . Toe ulcer, right ([place]) 09/19/2015  . Decreased pedal pulses 09/19/2015  . OSA on CPAP 09/05/2015  . Major depressive disorder, recurrent episode, moderate ([place]) 09/05/2015  . OSA (obstructive sleep apnea) 07/25/2015  . Passed out 06/28/2015  . Low back pain 06/11/2015  . Abnormality of gait 06/11/2015  . Dizziness 03/17/2015  . Weakness 02/21/2015  . Chronic renal insufficiency, stage III (moderate) 08/09/2014  . Diabetes ([place]) 11/07/2013  . Hematuria 06/21/2013  . Hepatic steatosis 09/09/2010  . UTI 09/15/2006  . PYURIA 09/03/2006  . Human immunodeficiency virus (HIV) disease ([place]) 06/04/2006  . HERPES ZOSTER, UNCOMPLICATED 76/73/4193  . HSV 06/04/2006  . Depression 06/04/2006  . DISORDER, ATTENTION DEFICIT W/HYPERACTIVITY 06/04/2006  . THROMBOPHLEBITIS NOS 06/04/2006  . GERD 06/04/2006  . ARTHRITIS, HAND 06/04/2006  . HYPERGLYCEMIA, HX OF 06/04/2006  . HEPATITIS B, HX OF 06/04/2006   Current Outpatient Prescriptions on File Prior to Visit  Medication Sig Dispense Refill  . abacavir-dolutegravir-lamiVUDine (TRIUMEQ) 600-50-300 MG tablet Take 1 tablet by mouth daily. 90 tablet 1  . acetaminophen (TYLENOL) 500 MG tablet Take 1,000 mg by mouth every 8 (eight) hours as needed for moderate pain.     [name] ALPRAZolam (XANAX) 1 MG tablet Take 1 mg by mouth 3 (three) times daily as needed for anxiety.     [name] amoxicillin-clavulanate (AUGMENTIN) 875-125 MG tablet TAKE (1) TABLET BY MOUTH  TWICE DAILY. 28 tablet 0  . amphetamine-dextroamphetamine (ADDERALL) 30 MG tablet Take 30 mg by mouth 3 (three) times daily.     [name] atorvastatin (LIPITOR) 10 MG tablet TAKE ONE TABLET BY MOUTH ONCE DAILY. 90 tablet 11  . Blood Glucose Monitoring Suppl (BLOOD GLUCOSE METER KIT AND SUPPLIES) Dispense based on patient and insurance preference. Use up to four times daily as directed. (FOR ICD-9 250.00, 250.01). 1 each 0  . divalproex (DEPAKOTE ER) 500 MG 24 hr tablet Take 1 tablet (500 mg total) by mouth at bedtime. 90 tablet 4  . glucose blood test strip Test blood sugar daily. Dx E11.9 50 each 11  . insulin aspart (NOVOLOG FLEXPEN) 100 UNIT/ML FlexPen 3 times a day (just before each meal) 06-28-08 units 15 mL 11  . Insulin Glargine (LANTUS SOLOSTAR) 100 UNIT/ML Solostar Pen Inject 55 Units into the skin at bedtime. 10 pen PRN  . Insulin Pen Needle (PEN NEEDLES 3/16") 31G X 5 MM MISC 1 Device by Other route 4 (four) times daily. Use to inject Lantus daily. 120 each 11  . levothyroxine (SYNTHROID, LEVOTHROID) 50 MCG tablet Take 1 tablet (50 mcg total) by mouth at bedtime. 30 tablet 11  . LYRICA 75 MG capsule TAKE ONE CAPSULE BY MOUTH THREE TIMES DAILY. 90 capsule 2  . prochlorperazine (COMPAZINE) 10 MG tablet TAKE ONE TABLET BY MOUTH EVERY 6 HOURS AS NEEDED. (Patient taking differently: TAKE ONE TABLET BY MOUTH EVERY 6 HOURS AS NEEDED FOR NAUSEA) 120 tablet 0  . ranitidine (ZANTAC) 150 MG tablet Take 150 mg by mouth daily.    . rivaroxaban (  XARELTO) 15 MG TABS tablet Take 1 tablet (15 mg total) by mouth daily. 30 tablet 11  . TRINTELLIX 20 MG TABS Take 20 mg by mouth at bedtime.    [name] UNABLE TO FIND CPAP MACHINE with standard [employer]claim nasal mask with humidifier. Set at 14 cwp 1 each 0  . VOLTAREN 1 % GEL APPLY 2 GRAMS TO EACH KNEE IN THE MORNING AND AT BEDTIME AND APPLY 1 GRAM TO EACH KNEE IN THE AFTERNOON. 300 g 5  . zolpidem (AMBIEN) 10 MG tablet Take 10-20 mg by mouth at bedtime as needed for sleep.       No current facility-administered medications on file prior to visit.    Allergies  Allergen Reactions  . Aspirin Swelling  . Ibuprofen Swelling  . Sustiva [Efavirenz] Swelling and Rash    And rash.  . Acetaminophen Other (See Comments)    States he was told by Nephrologist not to use Tylenol due to CKD.   States he was told by Nephrologist not to use Tylenol due to CKD.    . Nsaids Other (See Comments)    unknwn    Recent Results (from the past 2160 hour(s))  BASIC METABOLIC PANEL WITH GFR     Status: Abnormal   Collection Time: 11/12/15 12:27 PM  Result Value Ref Range   Sodium 139 135 - 146 mmol/L   Potassium 4.1 3.5 - 5.3 mmol/L   Chloride 103 98 - 110 mmol/L   CO2 27 20 - 31 mmol/L   Glucose, Bld 255 (H) 65 - 99 mg/dL   BUN 19 7 - 25 mg/dL   Creat 1.45 (H) 0.70 - 1.25 mg/dL    Comment:   For patients > or = 62 years of age: The upper reference limit for Creatinine is approximately 13% higher for people identified as African-American.      Calcium 8.7 8.6 - 10.3 mg/dL   GFR, Est African American 59 (L) >=60 mL/min   GFR, Est Non African American 51 (L) >=60 mL/min  POCT glucose (manual entry)     Status: Abnormal   Collection Time: 11/12/15 12:41 PM  Result Value Ref Range   POC Glucose 251 (A) 70 - 99 mg/dl  POCT CBC     Status: Abnormal   Collection Time: 11/12/15 12:42 PM  Result Value Ref Range   WBC 7.1 4.6 - 10.2 K/uL   Lymph, poc 2.1 0.6 - 3.4   POC LYMPH PERCENT 29.3 10 - 50 %L   MID (cbc) 0.6 0 - 0.9   POC MID % 8.5 0 - 12 %M   POC Granulocyte 4.4 2 - 6.9   Granulocyte percent 62.2 37 - 80 %G   RBC 4.85 4.69 - 6.13 M/uL   Hemoglobin 16.4 14.1 - 18.1 g/dL   HCT, POC 45.3 43.5 - 53.7 %   MCV 93.3 80 - 97 fL   MCH, POC 33.9 (A) 27 - 31.2 pg   MCHC 36.3 (A) 31.8 - 35.4 g/dL   RDW, POC 14.1 %   Platelet Count, POC 133 (A) 142 - 424 K/uL   MPV 6.2 0 - 99.8 fL  POCT glycosylated hemoglobin (Hb A1C)     Status: None   Collection Time: 11/12/15 12:47  PM  Result Value Ref Range   Hemoglobin A1C 8.2   POCT CBC     Status: Abnormal   Collection Time: 11/26/15  5:42 PM  Result Value Ref Range   WBC 7.8 4.6 - 10.2  K/uL   Lymph, poc 2.0 0.6 - 3.4   POC LYMPH PERCENT 25.9 10 - 50 %L   MID (cbc) 0.5 0 - 0.9   POC MID % 6.7 0 - 12 %M   POC Granulocyte 5.3 2 - 6.9   Granulocyte percent 67.4 37 - 80 %G   RBC 4.72 4.69 - 6.13 M/uL   Hemoglobin 15.7 14.1 - 18.1 g/dL   HCT, POC 43.5 43.5 - 53.7 %   MCV 92.1 80 - 97 fL   MCH, POC 33.2 (A) 27 - 31.2 pg   MCHC 36.0 (A) 31.8 - 35.4 g/dL   RDW, POC 13.6 %   Platelet Count, POC 161 142 - 424 K/uL   MPV 6.2 0 - 99.8 fL  POCT glucose (manual entry)     Status: Abnormal   Collection Time: 11/26/15  5:43 PM  Result Value Ref Range   POC Glucose 238 (A) 70 - 99 mg/dl  CBC with Differential     Status: Abnormal   Collection Time: 11/26/15  7:38 PM  Result Value Ref Range   WBC 8.3 4.0 - 10.5 K/uL   RBC 4.93 4.22 - 5.81 MIL/uL   Hemoglobin 16.2 13.0 - 17.0 g/dL   HCT 46.2 39.0 - 52.0 %   MCV 93.7 78.0 - 100.0 fL   MCH 32.9 26.0 - 34.0 pg   MCHC 35.1 30.0 - 36.0 g/dL   RDW 13.6 11.5 - 15.5 %   Platelets 144 (L) 150 - 400 K/uL   Neutrophils Relative % 61 %   Neutro Abs 5.0 1.7 - 7.7 K/uL   Lymphocytes Relative 28 %   Lymphs Abs 2.3 0.7 - 4.0 K/uL   Monocytes Relative 8 %   Monocytes Absolute 0.7 0.1 - 1.0 K/uL   Eosinophils Relative 3 %   Eosinophils Absolute 0.3 0.0 - 0.7 K/uL   Basophils Relative 0 %   Basophils Absolute 0.0 0.0 - 0.1 K/uL  Comprehensive metabolic panel     Status: Abnormal   Collection Time: 11/26/15  7:38 PM  Result Value Ref Range   Sodium 139 135 - 145 mmol/L   Potassium 3.9 3.5 - 5.1 mmol/L   Chloride 106 101 - 111 mmol/L   CO2 25 22 - 32 mmol/L   Glucose, Bld 190 (H) 65 - 99 mg/dL   BUN 20 6 - 20 mg/dL   Creatinine, Ser 1.55 (H) 0.61 - 1.24 mg/dL   Calcium 9.3 8.9 - 10.3 mg/dL   Total Protein 7.3 6.5 - 8.1 g/dL   Albumin 3.7 3.5 - 5.0 g/dL   AST 23 15 - 41  U/L   ALT 21 17 - 63 U/L   Alkaline Phosphatase 99 38 - 126 U/L   Total Bilirubin 0.6 0.3 - 1.2 mg/dL   GFR calc non Af Amer 46 (L) >60 mL/min   GFR calc Af Amer 54 (L) >60 mL/min    Comment: (NOTE) The eGFR has been calculated using the CKD EPI equation. This calculation has not been validated in all clinical situations. eGFR's persistently <60 mL/min signify possible Chronic Kidney Disease.    Anion gap 8 5 - 15  Urinalysis, Routine w reflex microscopic (not at [facility])     Status: Abnormal   Collection Time: 11/26/15  9:09 PM  Result Value Ref Range   Color, Urine YELLOW YELLOW   APPearance CLEAR CLEAR   Specific Gravity, Urine 1.035 (H) 1.005 - 1.030   pH 6.5 5.0 - 8.0  Glucose, UA >1000 (A) NEGATIVE mg/dL   Hgb urine dipstick TRACE (A) NEGATIVE   Bilirubin Urine NEGATIVE NEGATIVE   Ketones, ur NEGATIVE NEGATIVE mg/dL   Protein, ur 100 (A) NEGATIVE mg/dL   Nitrite NEGATIVE NEGATIVE   Leukocytes, UA NEGATIVE NEGATIVE  Urine microscopic-add on     Status: Abnormal   Collection Time: 11/26/15  9:09 PM  Result Value Ref Range   Squamous Epithelial / LPF 0-5 (A) NONE SEEN   WBC, UA 0-5 0 - 5 WBC/hpf   RBC / HPF 0-5 0 - 5 RBC/hpf   Bacteria, UA FEW (A) NONE SEEN   Crystals CA OXALATE CRYSTALS (A) NEGATIVE    Objective: There were no vitals filed for this visit.  General: Patient is awake, alert, oriented x 3 and in no acute distress.  Dermatology: Skin is warm and dry bilateral with a Partial thickness ulceration present Plantar right hallux, medial 2nd toe, lateral 3rd toe and 4th toes with multiple digital eschars with early necrosis and dry localized gangrenous changes to the toes on right foot. Ulcerations measures <0.5cm, slightly improved in appearance with less weeping since last encounter, There are mixed fibro-granular tissue present with surrounding eschar and necrosis. The ulcerations do not probe to bone however close to bone on 3rd and 4th toes right foot. There is  no malodor, no active drainage, + focal erythema, + focal edema. No other acute signs of infection. No other open lesions.    Vascular: Dorsalis Pedis pulse = +4/4 on right, left 0/4,  Posterior Tibial pulse = 0/4 Bilateral,  Capillary Fill Time < 5 seconds. 1+ pitting edema and mild varicosities.  Neurologic: Protective sensation severely diminished to the level of the ankles with the 5.07/10g [employer].  Musculosketal: No Pain with palpation to ulcerated areas. No pain with compression to calves bilateral.  Assessment and Plan:  Problem List Items Addressed This Visit      Cardiovascular and Mediastinum   Critical lower limb ischemia    Other Visit Diagnoses    Toe ulcer, right, limited to breakdown of skin ([name])    -  Primary   Diminished pulses in lower extremity       PAD (peripheral artery disease) ([name])       Diabetic neuropathy with neurologic complication ([place])         -Examined patient and discussed the progression of the wounds and treatment alternatives. -Patient is awaiting vascular follow up; appt 02-07-16 Dr. Bridgett Larsson -Debrided eschars using tissue nipper on right foot and applied betadine and dry sterile dressing and instructed patient to continue with daily dressings at home consisting of the same covered with dry sterile dressing with care to avoid wrapping dressings to tightly around toes. -Continue with Augmentin with refill as needed  -Advised patient to go to the ER if worsens -Continue with postoperative shoe -Patient to return to office in 2 weeks for follow up ulcer care and evaluation or sooner if problems arise.  Landis Martins, DPM

## 2016-01-24 ENCOUNTER — Ambulatory Visit: Payer: BLUE CROSS/BLUE SHIELD | Admitting: Endocrinology

## 2016-01-28 ENCOUNTER — Other Ambulatory Visit: Payer: BLUE CROSS/BLUE SHIELD

## 2016-01-28 DIAGNOSIS — B20 Human immunodeficiency virus [HIV] disease: Secondary | ICD-10-CM

## 2016-01-29 LAB — HIV-1 RNA QUANT-NO REFLEX-BLD
HIV 1 RNA Quant: <20 copies/mL (ref <20)
HIV-1 RNA Quant, Log: <1.3 Log copies/mL (ref <1.30)

## 2016-01-29 LAB — T-HELPER CELL (CD4) - (RCID CLINIC ONLY)
CD4 % Helper T Cell: 32 % — ABNORMAL LOW (ref 33–55)
CD4 T Cell Abs: 800 /uL (ref 400–2700)

## 2016-01-30 ENCOUNTER — Ambulatory Visit (HOSPITAL_COMMUNITY): Payer: BLUE CROSS/BLUE SHIELD | Attending: Physical Medicine and Rehabilitation | Admitting: Physical Therapy

## 2016-01-30 ENCOUNTER — Encounter: Payer: Self-pay | Admitting: Vascular Surgery

## 2016-01-30 DIAGNOSIS — R29898 Other symptoms and signs involving the musculoskeletal system: Secondary | ICD-10-CM | POA: Diagnosis present

## 2016-01-30 DIAGNOSIS — M545 Low back pain, unspecified: Secondary | ICD-10-CM

## 2016-01-30 DIAGNOSIS — R252 Cramp and spasm: Secondary | ICD-10-CM

## 2016-01-30 DIAGNOSIS — R293 Abnormal posture: Secondary | ICD-10-CM | POA: Diagnosis present

## 2016-01-30 DIAGNOSIS — G8929 Other chronic pain: Secondary | ICD-10-CM | POA: Insufficient documentation

## 2016-01-30 NOTE — Patient Instructions (Signed)
  SINGLE KNEE TO CHEST STRETCH - [place]  While Lying on your back, hold your knee and gently pull it up towards your chest.  Hold for 10 seconds, and repeat 5 times each side, twice a day.     Lumbar Rotations   Lying on your back with your knees bent, slowly drop your legs to one side and hold the stretch. Come back to the middle and switch sides. You should feel the stretch in your back on the opposite side that your legs are leaning.   Repeat 5 times each side, twice a day.

## 2016-01-30 NOTE — Therapy (Signed)
[place], [place], [address] Phone: [phone]   Fax:  [phone]  Physical Therapy Evaluation  Patient Details  Name: Daniel [name] MRN: [id number] Date of Birth: 22-Mar-1955 Referring Provider: Laroy Apple   Encounter Date: 01/30/2016      PT End of Session - 01/30/16 1754    Visit Number 1   Number of Visits 12   Date for PT Re-Evaluation 02/20/16   Authorization Type [employer]    Authorization Time Period 01/30/16 to 03/12/16   PT Start Time 1604   PT Stop Time 1643   PT Time Calculation (min) 39 min   Activity Tolerance Patient tolerated treatment well   Behavior During Therapy [facility] for tasks assessed/performed      Past Medical History:  Diagnosis Date  . ADHD (attention deficit hyperactivity disorder)   . Anxiety   . Chronic kidney disease   . Clotting disorder ([place])   . Depression   . Diabetes mellitus without complication ([place])   . Diabetes mellitus, type II ([place])   . Dizziness 03/17/2015  . GERD (gastroesophageal reflux disease)   . HIV disease ([place])   . HIV infection ([place])   . Liver disease   . OSA (obstructive sleep apnea) 07/25/2015   Uses CPAP regularly  . Ulcer [facility])     Past Surgical History:  Procedure Laterality Date  . SMALL INTESTINE SURGERY    . STOMACH SURGERY      There were no vitals filed for this visit.       Subjective Assessment - 01/30/16 1608    Subjective Patient states that his back pain has been bothering him for years, getting worse and worse with time; it is now hard for him to go to the grocery store. He cannot recall a fall or mechnical incident that started his back pain. He has had multiple falls, including one backwards down some stairs. He does have diabetic neuropathy, as well as a toe ulcer for which he is going to a [facility] for possible stents next week. He had some seizure activity earlier this year, complete with legs giving way and "not  working", and reports medicine that neurologist gave him has resolved this. No bowel or bladder incontinence. His falls have been related to incidents where his legs "were not working", other times he just fell to the side.     Pertinent History active xarelto, thrombophlebitis, critical LE ischemia with toe ulcers, OSA, DM with neuropathy, HIV, herpes zoster, HSV, hep B, major depressive disorder, history of falls, ADHD, history of seizures    How long can you sit comfortably? unlimited, sitting relieves pain    How long can you stand comfortably? not sure, does not stand for any length of time without a prop    How long can you walk comfortably? not sure    Patient Stated Goals reduce pain in back    Currently in Pain? Yes   Pain Score 2    Pain Location Back   Pain Orientation Lower;Right;Left   Pain Descriptors / Indicators Sharp   Pain Type Chronic pain   Pain Radiating Towards hard to tell due to other impairments per patient    Pain Onset More than a month ago   Pain Frequency Intermittent   Aggravating Factors  standing/walking for long periods of time    Pain Relieving Factors sitting down, chiropractor   Effect of Pain on Daily Activities severe limits on ADLs  [facility] PT Assessment - 01/30/16 0001      Assessment   Medical Diagnosis LBP    Referring Provider Laroy Apple    Onset Date/Surgical Date --  chronic    Next MD Visit not sure    Prior Therapy none, chiropractor      Balance Screen   Has the patient fallen in the past 6 months No   Has the patient had a decrease in activity level because of a fear of falling?  No   Is the patient reluctant to leave their home because of a fear of falling?  No     Prior Function   Level of Independence Independent;Independent with basic ADLs;Independent with gait;Independent with transfers     Observation/Other Assessments   Focus on Therapeutic Outcomes (FOTO)  57% limited      AROM   Lumbar Flexion  approximately 4 inches from floor    Lumbar Extension very rigid, hip compensation    Lumbar - Right Side Bend WFL, trunk flexion compensation    Lumbar - Left Side Bend WFL, trunk flexion compensation     Strength   Right Hip Flexion 4+/5   Right Hip Extension 3-/5   Right Hip ABduction 4+/5   Left Hip Flexion 5/5   Left Hip Extension 3-/5   Left Hip ABduction 4+/5   Right Knee Flexion 4+/5   Right Knee Extension 5/5   Left Knee Flexion 4+/5   Left Knee Extension 4+/5   Right Ankle Dorsiflexion 4+/5   Left Ankle Dorsiflexion 5/5     Flexibility   Hamstrings moderate tightness B    Piriformis moderate tightness B      Palpation   Spinal mobility hypomobile lumbar/low thoracic spine    SI assessment  sacrum [facility]      Ambulation/Gait   Gait Comments proximal muscle weakness, varus knees, hip ER      High Level Balance   High Level Balance Comments SLS 3-6 seconds best B                            PT Education - 01/30/16 1754    Education provided Yes   Education Details prognosis, POC, HEP, refrain from going to chiropractor so we can assess response to skilled PT services during POC    Person(s) Educated Patient   Methods Explanation;Demonstration;Handout   Comprehension Verbalized understanding;Returned demonstration;Need further instruction          PT Short Term Goals - 01/30/16 1758      PT SHORT TERM GOAL #1   Title Patient to be able to maintain correct posture at least 75% of the time in order to improve mechanics and reduce discomfort    Time 3   Period Weeks   Status New     PT SHORT TERM GOAL #2   Title Patient to show a reduction in muscle stiffness of all tested groups by at least 50% in order to improve mechanics and reduce pain    Time 3   Period Weeks   Status New     PT SHORT TERM GOAL #3   Title Patient to be independent in correctly and consistently performing appropriate HEP, to be updated PRN    Time 1   Period Weeks    Status New           PT Long Term Goals - 01/30/16 1759      PT LONG TERM GOAL #  1   Title Patient to demonstrate B hip mobiltiy with only min limiitation in order to improve mechanics and reduce pain    Time 6   Period Weeks   Status New     PT LONG TERM GOAL #2   Title Patient to demonstrate only minimal lumbar mobility restrictions in order to reduce pain and improve mechanics    Time 6   Period Weeks   Status New     PT LONG TERM GOAL #3   Title Patient to be able to tolerate CKC based tasks for at least 20-30 minutes with no pain exacerbation in order to improve functional task performance    Time 6   Period Weeks   Status New     PT LONG TERM GOAL #4   Title Patient to experience pain no more than 3/10 in order to improve functional task tolerance and performance    Time 6   Period Weeks   Status New     PT LONG TERM GOAL #5   Title Patient to be able to maintain 20 seconds with SLS each LE in order to reduce fall risk and improve mobiltiy    Time 6   Period Weeks   Status New               Plan - 01/30/16 1755    Clinical Impression Statement Patient arrives with long-standing low back pain, which he reports is directly across his low back at his belt line and limits his ability to stand/walk extended periods; he had some falls possibly related, per patient, to seizure activity this year as his falls secondary to LEs collapsing have stopped since he started a medicine from his neurologist for seizure control. He does get relief from going to his chiropractor for manipulations and does report relief, however has not been in some time and was educated to refrain from going to chiropractor so that we can assess true impact of skilled PT services/identify aggravating factors as needed. Upon examination, patient reveals significant muscle stiffness, bilateral hip joint stiffness, lumbar stiffness, postural deficits, and reduced tolerance to CKC based activities.  Recommend skilled PT services to focus on identified functional limitations and to reach optimal level of function.    Rehab Potential Good   Clinical Impairments Affecting Rehab Potential chronicity of symptoms   PT Frequency 2x / week   PT Duration 6 weeks   PT Treatment/Interventions ADLs/Self Care Home Management;Biofeedback;Cryotherapy;Moist Heat;Gait training;Stair training;Functional mobility training;Therapeutic activities;Therapeutic exercise;Balance training;Neuromuscular re-education;Patient/family education;Manual techniques;Passive range of motion;Energy conservation;Taping   PT Next Visit Plan review HEP and goals; focus on hip and lumbar mobility, posture, functional stretching    PT Home Exercise Plan Eval: SKTC, lumbar rotations    Consulted and Agree with Plan of Care Patient      Patient will benefit from skilled therapeutic intervention in order to improve the following deficits and impairments:  Abnormal gait, Improper body mechanics, Pain, Increased fascial restricitons, Increased muscle spasms, Postural dysfunction, Decreased activity tolerance, Decreased range of motion, Hypomobility, Decreased balance, Difficulty walking, Impaired flexibility  Visit Diagnosis: Chronic bilateral low back pain without sciatica - Plan: PT plan of care cert/re-cert  Abnormal posture - Plan: PT plan of care cert/re-cert  Cramp and spasm - Plan: PT plan of care cert/re-cert  Other symptoms and signs involving the musculoskeletal system - Plan: PT plan of care cert/re-cert     Problem List Patient Active Problem List   Diagnosis Date Noted  . Critical  lower limb ischemia 12/07/2015  . Fall 12/05/2015  . Toe ulcer, right ([place]) 09/19/2015  . Decreased pedal pulses 09/19/2015  . OSA on CPAP 09/05/2015  . Major depressive disorder, recurrent episode, moderate ([place]) 09/05/2015  . OSA (obstructive sleep apnea) 07/25/2015  . Passed out 06/28/2015  . Low back pain 06/11/2015  .  Abnormality of gait 06/11/2015  . Dizziness 03/17/2015  . Weakness 02/21/2015  . Chronic renal insufficiency, stage III (moderate) 08/09/2014  . Diabetes ([place]) 11/07/2013  . Hematuria 06/21/2013  . Hepatic steatosis 09/09/2010  . UTI 09/15/2006  . PYURIA 09/03/2006  . Human immunodeficiency virus (HIV) disease ([place]) 06/04/2006  . HERPES ZOSTER, UNCOMPLICATED 11/00/3496  . HSV 06/04/2006  . Depression 06/04/2006  . DISORDER, ATTENTION DEFICIT W/HYPERACTIVITY 06/04/2006  . THROMBOPHLEBITIS NOS 06/04/2006  . GERD 06/04/2006  . ARTHRITIS, HAND 06/04/2006  . HYPERGLYCEMIA, HX OF 06/04/2006  . HEPATITIS B, HX OF 06/04/2006    Deniece Ree PT, DPT [place] [address] [place], [place], [address] Phone: [phone]   Fax:  [phone]  Name: Daniel [name] MRN: [id number] Date of Birth: May 02, 1955

## 2016-02-03 NOTE — Progress Notes (Signed)
Subjective:    Patient ID: Daniel [name], male    DOB: 1955/08/17, 60 y.o.   MRN: [id number]  HPI Pt returns for f/u of diabetes mellitus: DM type: Insulin-requiring type 2.  Dx'ed: 1740 Complications: polyneuropathy, PAD, renal insufficiency, and foot ulcers.  Therapy: insulin since 2016. DKA: never Severe hypoglycemia: never.  Pancreatitis: never Other: he takes multiple daily injections.   Interval history: He brings a record of his cbg's which I have reviewed today.  He checks only at HS, and fasting.  It varies from 100-400.  It is higher at hs, than fasting, but not necessarily so.  He says the variation is due to variable meals.   Past Medical History:  Diagnosis Date  . ADHD (attention deficit hyperactivity disorder)   . Anxiety   . Chronic kidney disease   . Clotting disorder ([place])   . Depression   . Diabetes mellitus without complication ([place])   . Diabetes mellitus, type II ([place])   . Dizziness 03/17/2015  . GERD (gastroesophageal reflux disease)   . HIV disease ([place])   . HIV infection ([place])   . Liver disease   . OSA (obstructive sleep apnea) 07/25/2015   Uses CPAP regularly  . Ulcer [facility])     Past Surgical History:  Procedure Laterality Date  . SMALL INTESTINE SURGERY    . STOMACH SURGERY      Social History   Social History  . Marital status: Single    Spouse name: N/A  . Number of children: N/A  . Years of education: N/A   Occupational History  . Not on file.   Social History Main Topics  . Smoking status: Former Smoker    Packs/day: 0.10    Years: 10.00    Types: Cigars, Cigarettes    Quit date: 08/09/2014  . Smokeless tobacco: Never Used  . Alcohol use No  . Drug use: No  . Sexual activity: Not Currently    Partners: Male     Comment: pt. declined condoms   Other Topics Concern  . Not on file   Social History Narrative   Epworth Sleepiness Scale = 7 (as of 03/16/2015)    Current Outpatient Prescriptions on File Prior to Visit    Medication Sig Dispense Refill  . abacavir-dolutegravir-lamiVUDine (TRIUMEQ) 600-50-300 MG tablet Take 1 tablet by mouth daily. 90 tablet 1  . acetaminophen (TYLENOL) 500 MG tablet Take 1,000 mg by mouth every 8 (eight) hours as needed for moderate pain.     [name] ALPRAZolam (XANAX) 1 MG tablet Take 1 mg by mouth 3 (three) times daily as needed for anxiety.     [name] amphetamine-dextroamphetamine (ADDERALL) 30 MG tablet Take 30 mg by mouth 3 (three) times daily.     [name] atorvastatin (LIPITOR) 10 MG tablet TAKE ONE TABLET BY MOUTH ONCE DAILY. 90 tablet 11  . Blood Glucose Monitoring Suppl (BLOOD GLUCOSE METER KIT AND SUPPLIES) Dispense based on patient and insurance preference. Use up to four times daily as directed. (FOR ICD-9 250.00, 250.01). 1 each 0  . divalproex (DEPAKOTE ER) 500 MG 24 hr tablet Take 1 tablet (500 mg total) by mouth at bedtime. 90 tablet 4  . glucose blood test strip Test blood sugar daily. Dx E11.9 50 each 11  . insulin aspart (NOVOLOG FLEXPEN) 100 UNIT/ML FlexPen 3 times a day (just before each meal) 06-28-08 units 15 mL 11  . Insulin Pen Needle (PEN NEEDLES 3/16") 31G X 5 MM MISC 1 Device by Other  route 4 (four) times daily. Use to inject Lantus daily. 120 each 11  . levothyroxine (SYNTHROID, LEVOTHROID) 50 MCG tablet Take 1 tablet (50 mcg total) by mouth at bedtime. 30 tablet 11  . LYRICA 75 MG capsule TAKE ONE CAPSULE BY MOUTH THREE TIMES DAILY. 90 capsule 2  . ranitidine (ZANTAC) 150 MG tablet Take 150 mg by mouth daily.    . rivaroxaban (XARELTO) 15 MG TABS tablet Take 1 tablet (15 mg total) by mouth daily. 30 tablet 11  . TRINTELLIX 20 MG TABS Take 20 mg by mouth at bedtime.    [name] UNABLE TO FIND CPAP MACHINE with standard [employer]claim nasal mask with humidifier. Set at 14 cwp 1 each 0  . VOLTAREN 1 % GEL APPLY 2 GRAMS TO EACH KNEE IN THE MORNING AND AT BEDTIME AND APPLY 1 GRAM TO EACH KNEE IN THE AFTERNOON. 300 g 5  . zolpidem (AMBIEN) 10 MG tablet Take 10-20 mg by mouth at bedtime as  needed for sleep.      No current facility-administered medications on file prior to visit.     Allergies  Allergen Reactions  . Aspirin Swelling  . Ibuprofen Swelling  . Sustiva [Efavirenz] Swelling and Rash    And rash.  . Nsaids Other (See Comments)    unknwn    Family History  Problem Relation Age of Onset  . Cancer Brother   . Depression Brother   . COPD Mother   . Diabetes Neg Hx     BP 130/80   Pulse (!) 104   Ht 5' 10"  (1.778 m)   Wt 245 lb (111.1 kg)   SpO2 96%   BMI 35.15 kg/m   Review of Systems He denies hypoglycemia.      Objective:   Physical Exam VITAL SIGNS:  See vs page.  GENERAL: no distress. SKIN:  no ulcer on the feet, but the skin is dry; feet are of normal color and temp.  EXTEMITIES: no deformity.  1+ left leg edema.  Right foot is bandaged (sees wound care).  There is onychomycosis of the left toenails   PULSES: dorsalis pedis intact bilat.   NEURO: sensation is intact to touch on the feet, but decreased from normal.   A1c=8.9%.      Assessment & Plan:  Insulin-requiring type 2 DM, with renal insufficiency: he needs increased rx.    Patient is advised the following: Patient Instructions  check your blood sugar twice a day.  vary the time of day when you check, between before the 3 meals, and at bedtime.  also check if you have symptoms of your blood sugar being too high or too low.  please keep a record of the readings and bring it to your next appointment here (or you can bring the meter itself).  You can write it on any piece of paper.  please call us sooner if your blood sugar goes below 70, or if you have a lot of readings over 200.   For now, please: increase the lantus to 65 units each morning, and:  Please continue the same novolog: 3 times a day (just before each meal) 06-28-08 units.  Please come back for a follow-up appointment in 2 months.

## 2016-02-04 ENCOUNTER — Ambulatory Visit (INDEPENDENT_AMBULATORY_CARE_PROVIDER_SITE_OTHER): Payer: BLUE CROSS/BLUE SHIELD | Admitting: Endocrinology

## 2016-02-04 ENCOUNTER — Encounter: Payer: Self-pay | Admitting: Endocrinology

## 2016-02-04 VITALS — BP 130/80 | HR 104 | Ht 70.0 in | Wt 245.0 lb

## 2016-02-04 DIAGNOSIS — E0849 Diabetes mellitus due to underlying condition with other diabetic neurological complication: Secondary | ICD-10-CM

## 2016-02-04 DIAGNOSIS — Z794 Long term (current) use of insulin: Secondary | ICD-10-CM | POA: Diagnosis not present

## 2016-02-04 LAB — POCT GLYCOSYLATED HEMOGLOBIN (HGB A1C): Hemoglobin A1C: 8.9

## 2016-02-04 MED ORDER — INSULIN GLARGINE 100 UNIT/ML SOLOSTAR PEN: 65.0000 [IU] | PEN_INJECTOR | SUBCUTANEOUS | 99 refills | Status: DC

## 2016-02-04 NOTE — Patient Instructions (Addendum)
check your blood sugar twice a day.  vary the time of day when you check, between before the 3 meals, and at bedtime.  also check if you have symptoms of your blood sugar being too high or too low.  please keep a record of the readings and bring it to your next appointment here (or you can bring the meter itself).  You can write it on any piece of paper.  please call us sooner if your blood sugar goes below 70, or if you have a lot of readings over 200.   For now, please: increase the lantus to 65 units each morning, and:  Please continue the same novolog: 3 times a day (just before each meal) 06-28-08 units.  Please come back for a follow-up appointment in 2 months.

## 2016-02-05 ENCOUNTER — Ambulatory Visit: Payer: BLUE CROSS/BLUE SHIELD | Admitting: Podiatry

## 2016-02-07 ENCOUNTER — Encounter: Payer: Self-pay | Admitting: Vascular Surgery

## 2016-02-07 ENCOUNTER — Ambulatory Visit (HOSPITAL_COMMUNITY): Payer: BLUE CROSS/BLUE SHIELD

## 2016-02-07 ENCOUNTER — Ambulatory Visit (INDEPENDENT_AMBULATORY_CARE_PROVIDER_SITE_OTHER): Payer: BLUE CROSS/BLUE SHIELD | Admitting: Vascular Surgery

## 2016-02-07 VITALS — BP 124/72 | HR 98 | Temp 98.7°F | Resp 18 | Ht 70.5 in | Wt 243.0 lb

## 2016-02-07 DIAGNOSIS — L97511 Non-pressure chronic ulcer of other part of right foot limited to breakdown of skin: Secondary | ICD-10-CM

## 2016-02-07 DIAGNOSIS — E11621 Type 2 diabetes mellitus with foot ulcer: Secondary | ICD-10-CM | POA: Diagnosis not present

## 2016-02-07 NOTE — Telephone Encounter (Signed)
No show, called and left message about missed apt.  Included next apt date and time with contact info given.    [address], [place]; [facility] [phone]

## 2016-02-07 NOTE — Progress Notes (Signed)
Patient name: Daniel [name] MRN: [id number] DOB: December 09, 1955 Sex: male  REASON FOR VISIT: nonhealing wounds right foot, referred by [name] (podiatry)  HPI: Daniel [name] is a 60 y.o. male, who presents with a 4 month history of ulceration to right great toe. The patient previously had lower extremity arterial duplex and ABIs at [facility] back in August. His studies revealed triphasic waveforms bilaterally with normal ABIs.  He has been seeing podiatrist [name] for wound care. He states that it intermittently gets better but also worsens. He has been on antibiotics since July. The patient denies any drainage, fever or chills.  He denies a history of claudication. He is able to walk around [place] shopping without issues. He does report wounds of his left foot previously. He has a history of poorly controlled diabetes. He is on insulin. His last A1c was around 8. He reports sugars as high as 700. His past medical history also includes HIV and CKD. He has no prior history of cardiac issues. He is on atorvastatin for hyperlipidemia. He has history of recurrent DVT on Xarelto. He is trying to lose weight, but reports difficulty walking with his foot ulcers. He is a former smoker quitting in 2016.  Past Medical History:  Diagnosis Date  . ADHD (attention deficit hyperactivity disorder)   . Anxiety   . Chronic kidney disease   . Clotting disorder ([place])   . Depression   . Diabetes mellitus without complication ([place])   . Diabetes mellitus, type II ([place])   . Dizziness 03/17/2015  . GERD (gastroesophageal reflux disease)   . HIV disease ([place])   . HIV infection ([place])   . Liver disease   . OSA (obstructive sleep apnea) 07/25/2015   Uses CPAP regularly  . Ulcer ([place])     Family History  Problem Relation Age of Onset  . Cancer Brother   . Depression Brother   . COPD Mother   . Diabetes Neg Hx     SOCIAL HISTORY: Social History   Social History  . Marital status:  Single    Spouse name: N/A  . Number of children: N/A  . Years of education: N/A   Occupational History  . Not on file.   Social History Main Topics  . Smoking status: Former Smoker    Packs/day: 0.10    Years: 10.00    Types: Cigars, Cigarettes    Quit date: 08/09/2014  . Smokeless tobacco: Never Used  . Alcohol use No  . Drug use: No  . Sexual activity: Not Currently    Partners: Male     Comment: pt. declined condoms   Other Topics Concern  . Not on file   Social History Narrative   Epworth Sleepiness Scale = 7 (as of 03/16/2015)    Allergies  Allergen Reactions  . Aspirin Swelling  . Ibuprofen Swelling  . Sustiva [Efavirenz] Swelling and Rash    And rash.  . Nsaids Other (See Comments)    unknwn    Current Outpatient Prescriptions  Medication Sig Dispense Refill  . abacavir-dolutegravir-lamiVUDine (TRIUMEQ) 600-50-300 MG tablet Take 1 tablet by mouth daily. 90 tablet 1  . acetaminophen (TYLENOL) 500 MG tablet Take 1,000 mg by mouth every 8 (eight) hours as needed for moderate pain.     [name] ALPRAZolam (XANAX) 1 MG tablet Take 1 mg by mouth 3 (three) times daily as needed for anxiety.     [name] amoxicillin-clavulanate (AUGMENTIN) 875-125 MG tablet  0  . amphetamine-dextroamphetamine (ADDERALL) 30 MG tablet Take 30 mg by mouth 3 (three) times daily.     [name] atorvastatin (LIPITOR) 10 MG tablet TAKE ONE TABLET BY MOUTH ONCE DAILY. 90 tablet 11  . Blood Glucose Monitoring Suppl (BLOOD GLUCOSE METER KIT AND SUPPLIES) Dispense based on patient and insurance preference. Use up to four times daily as directed. (FOR ICD-9 250.00, 250.01). 1 each 0  . divalproex (DEPAKOTE ER) 500 MG 24 hr tablet Take 1 tablet (500 mg total) by mouth at bedtime. 90 tablet 4  . glucose blood test strip Test blood sugar daily. Dx E11.9 50 each 11  . insulin aspart (NOVOLOG FLEXPEN) 100 UNIT/ML FlexPen 3 times a day (just before each meal) 06-28-08 units 15 mL 11  . Insulin Glargine (LANTUS SOLOSTAR) 100  UNIT/ML Solostar Pen Inject 65 Units into the skin every morning. 10 pen PRN  . Insulin Pen Needle (PEN NEEDLES 3/16") 31G X 5 MM MISC 1 Device by Other route 4 (four) times daily. Use to inject Lantus daily. 120 each 11  . levothyroxine (SYNTHROID, LEVOTHROID) 50 MCG tablet Take 1 tablet (50 mcg total) by mouth at bedtime. 30 tablet 11  . LYRICA 75 MG capsule TAKE ONE CAPSULE BY MOUTH THREE TIMES DAILY. 90 capsule 2  . protriptyline (VIVACTIL) 10 MG tablet   11  . ranitidine (ZANTAC) 150 MG tablet Take 150 mg by mouth daily.    . rivaroxaban (XARELTO) 15 MG TABS tablet Take 1 tablet (15 mg total) by mouth daily. 30 tablet 11  . TRINTELLIX 20 MG TABS Take 20 mg by mouth at bedtime.    [name] UNABLE TO FIND CPAP MACHINE with standard [employer]claim nasal mask with humidifier. Set at 14 cwp 1 each 0  . VOLTAREN 1 % GEL APPLY 2 GRAMS TO EACH KNEE IN THE MORNING AND AT BEDTIME AND APPLY 1 GRAM TO EACH KNEE IN THE AFTERNOON. 300 g 5  . zolpidem (AMBIEN) 10 MG tablet Take 10-20 mg by mouth at bedtime as needed for sleep.      No current facility-administered medications for this visit.     REVIEW OF SYSTEMS:  [X]  denotes positive finding, [ ]  denotes negative finding Cardiac  Comments:  Chest pain or chest pressure:    Shortness of breath upon exertion: x   Short of breath when lying flat:    Irregular heart rhythm:        Vascular    Pain in calf, thigh, or hip brought on by ambulation:    Pain in feet at night that wakes you up from your sleep:     Blood clot in your veins: x RLE DVT on Xarelto  Leg swelling:         Pulmonary    Oxygen at home:    Productive cough:     Wheezing:         Neurologic    Sudden weakness in arms or legs:     Sudden numbness in arms or legs:     Sudden onset of difficulty speaking or slurred speech:    Temporary loss of vision in one eye:     Problems with dizziness:         Gastrointestinal    Blood in stool:     Vomited blood:         Genitourinary      Burning when urinating:     Blood in urine:        Psychiatric  Major depression:         Hematologic    Bleeding problems:    Problems with blood clotting too easily:        Skin    Rashes or ulcers:        Constitutional    Fever or chills:      PHYSICAL EXAM: Vitals:   02/07/16 1438  BP: 124/72  Pulse: 98  Resp: 18  Temp: 98.7 F (37.1 C)  TempSrc: Oral  SpO2: 99%  Weight: 243 lb (110.2 kg)  Height: 5' 10.5" (1.791 m)    GENERAL: The patient is a well-nourished male, in no acute distress. The vital signs are documented above. CARDIAC: There is a regular rate and rhythm. No carotid bruits. VASCULAR: 2+ radial pulses bilaterally. 2+ right femoral pulse. 1+ left femoral pulse. Nonpalpable popliteal pulses. 2+ dorsalis pedis pulses bilaterally. 1+ left history tibial pulse. Nonpalpable right posterior tibial pulse. PULMONARY: There is good air exchange bilaterally without wheezing or rales. ABDOMEN: Soft and non-tender.  MUSCULOSKELETAL: Right great toe with ulceration to the plantar aspect. Right great toe is swollen and erythematous. No drainage seen. No bone visible. Ulceration between right first and second toes. NEUROLOGIC: Severely decreased sensation to bilateral feet. SKIN: See musculoskeletal section above.  PSYCHIATRIC: The patient has a normal affect.  DATA:  ABIs and arterial duplex from 09/28/2015 at [facility] revealing triphasic waveforms bilateral lower extremities with ABIs of right: 1.3, left 1.1  MEDICAL ISSUES: Diabetic foot ulcer, right   -The patient likely has digital artery disease in the setting of poorly controlled diabetes. -He has normal lower extremity arterial function based on noninvasive studies back in August. -His right great toe looks swollen and erythematous. Suspect possible osteomyelitis. He is currently on Augmentin. Advised him to continue this. -Will refer him to Dr. Meridee Score for diabetic foot ulcer  management. Patient understands that he may possibly require toe amputation in the future. -He will follow-up with [name] on an as-needed basis.  Virgina Jock, PA-C [facility]   Addendum  I have independently interviewed and examined the patient, and I agree with the physician assistant's findings.  Pt has strongly palpable B DP pulses.  Essentially this patient has digital artery disease in both feet due his uncontrolled IDDM.  No further revascularization is necessary.  The right great toe already has an appearance worrisome for possible osteomyelitis.  I would refer this patient to Dr. Sharol Given for further management including possible toe amputation.     Adele Barthel, MD, FACS [facility] Office: [phone] Pager: [phone]  02/07/2016, 3:33 PM

## 2016-02-08 ENCOUNTER — Ambulatory Visit: Payer: BLUE CROSS/BLUE SHIELD | Admitting: Sports Medicine

## 2016-02-11 ENCOUNTER — Encounter: Payer: Self-pay | Admitting: Internal Medicine

## 2016-02-11 ENCOUNTER — Ambulatory Visit (INDEPENDENT_AMBULATORY_CARE_PROVIDER_SITE_OTHER): Payer: BLUE CROSS/BLUE SHIELD | Admitting: Internal Medicine

## 2016-02-11 ENCOUNTER — Ambulatory Visit: Payer: BLUE CROSS/BLUE SHIELD

## 2016-02-11 VITALS — BP 145/89 | HR 105 | Temp 98.0°F | Wt 245.0 lb

## 2016-02-11 DIAGNOSIS — B20 Human immunodeficiency virus [HIV] disease: Secondary | ICD-10-CM | POA: Diagnosis not present

## 2016-02-11 DIAGNOSIS — N183 Chronic kidney disease, stage 3 unspecified: Secondary | ICD-10-CM

## 2016-02-11 DIAGNOSIS — Z113 Encounter for screening for infections with a predominantly sexual mode of transmission: Secondary | ICD-10-CM

## 2016-02-11 MED ORDER — ONDANSETRON 4 MG PO TBDP: 8.0000 mg | ORAL_TABLET | Freq: Three times a day (TID) | ORAL | 5 refills | Status: DC | PRN

## 2016-02-11 NOTE — Assessment & Plan Note (Signed)
He continues to do well and can rtc 6 months.

## 2016-02-11 NOTE — Assessment & Plan Note (Signed)
This has been stable and no dose adjustment indicated.

## 2016-02-11 NOTE — Progress Notes (Signed)
   Subjective:    Patient ID: Daniel [name], male    DOB: 10-10-1955, 60 y.o.   MRN: [id number]  HPI He comes in here for follow-up.    He had been on Stribild but with significantly elevated creatinine with normal BUN suggestive of intrinsic renal disease and he was changed to Triumeq.  He follows Dr. Clover Mealy at [facility] and reports doing well there.  Started on sz medication. Some depression and followed by psychiatry and on medications for that. no suicidal ideation.  He also is being referred to Dr. Sharol Given for nectrotic toes and consideration of amputation after seeing Dr. Bridgett Larsson.  Patient very resistant to considering amputation.    Review of Systems  Constitutional: Negative for fatigue.  HENT: Negative for trouble swallowing.   Gastrointestinal: Negative for diarrhea and nausea.  Genitourinary: Negative for discharge.  Skin: Negative for rash.  Psychiatric/Behavioral: Negative for sleep disturbance.       Objective:   Physical Exam  Constitutional: He appears well-developed and well-nourished. No distress.  Eyes: No scleral icterus.  Cardiovascular: Normal rate, regular rhythm and normal heart sounds.   No murmur heard. Pulmonary/Chest: Effort normal and breath sounds normal. No respiratory distress.  Lymphadenopathy:    He has no cervical adenopathy.  Skin: No rash noted.   Social History   Social History  . Marital status: Single    Spouse name: N/A  . Number of children: N/A  . Years of education: N/A   Occupational History  . Not on file.   Social History Main Topics  . Smoking status: Former Smoker    Packs/day: 0.10    Years: 10.00    Types: Cigars, Cigarettes    Quit date: 08/09/2014  . Smokeless tobacco: Never Used  . Alcohol use No  . Drug use: No  . Sexual activity: Not Currently    Partners: Male     Comment: pt. declined condoms   Other Topics Concern  . Not on file   Social History Narrative   Epworth Sleepiness Scale = 7 (as of  03/16/2015)         Assessment & Plan:

## 2016-02-12 ENCOUNTER — Ambulatory Visit: Payer: BLUE CROSS/BLUE SHIELD

## 2016-02-12 ENCOUNTER — Ambulatory Visit (HOSPITAL_COMMUNITY): Payer: BLUE CROSS/BLUE SHIELD

## 2016-02-12 DIAGNOSIS — M545 Low back pain, unspecified: Secondary | ICD-10-CM

## 2016-02-12 DIAGNOSIS — G8929 Other chronic pain: Secondary | ICD-10-CM

## 2016-02-12 DIAGNOSIS — R293 Abnormal posture: Secondary | ICD-10-CM

## 2016-02-12 DIAGNOSIS — R252 Cramp and spasm: Secondary | ICD-10-CM

## 2016-02-12 DIAGNOSIS — R29898 Other symptoms and signs involving the musculoskeletal system: Secondary | ICD-10-CM

## 2016-02-12 NOTE — Therapy (Signed)
[place], [place], [address] Phone: [phone]   Fax:  [phone]  Physical Therapy Treatment  Patient Details  Name: Daniel [name] MRN: [id number] Date of Birth: Jun 13, 1955 Referring Provider: Laroy Apple   Encounter Date: 02/12/2016      PT End of Session - 02/12/16 1705    Visit Number 2   Number of Visits 12   Date for PT Re-Evaluation 02/20/16   Authorization Type BCBS    Authorization Time Period 01/30/16 to 03/12/16   PT Start Time 1650   PT Stop Time 1728   PT Time Calculation (min) 38 min   Activity Tolerance Patient tolerated treatment well   Behavior During Therapy [facility] for tasks assessed/performed      Past Medical History:  Diagnosis Date  . ADHD (attention deficit hyperactivity disorder)   . Anxiety   . Chronic kidney disease   . Clotting disorder ([place])   . Depression   . Diabetes mellitus without complication ([place])   . Diabetes mellitus, type II ([place])   . Dizziness 03/17/2015  . GERD (gastroesophageal reflux disease)   . HIV disease ([place])   . HIV infection ([place])   . Liver disease   . OSA (obstructive sleep apnea) 07/25/2015   Uses CPAP regularly  . Ulcer [facility])     Past Surgical History:  Procedure Laterality Date  . SMALL INTESTINE SURGERY    . STOMACH SURGERY      There were no vitals filed for this visit.      Subjective Assessment - 02/12/16 1654    Subjective Pt stated pain is bearable today, has been wearing back brace during ADLs while WBing.  Current pain scale 3-4/10.  Reports he hasn't began HEP due to lazyness.   Pertinent History active xarelto, thrombophlebitis, critical LE ischemia with toe ulcers, OSA, DM with neuropathy, HIV, herpes zoster, HSV, hep B, major depressive disorder, history of falls, ADHD, history of seizures    Patient Stated Goals reduce pain in back    Currently in Pain? Yes   Pain Score 4    Pain Location Back   Pain Orientation Lower    Pain Descriptors / Indicators Dull;Aching   Pain Type Chronic pain   Pain Onset More than a month ago   Pain Frequency Intermittent   Aggravating Factors  standing/walking for long periods of time   Pain Relieving Factors sitting down   Effect of Pain on Daily Activities severe limits on ADLs                         [facility] Adult PT Treatment/Exercise - 02/12/16 0001      Bed Mobility   Bed Mobility (P)  Sit to Sidelying Left   Sit to Sidelying Left (P)  5: Supervision     Exercises   Exercises Lumbar     Lumbar Exercises: Stretches   Active Hamstring Stretch 3 reps;30 seconds   Active Hamstring Stretch Limitations supine wiht rope   Single Knee to Chest Stretch 5 reps;10 seconds   Single Knee to Chest Stretch Limitations towel assistance   Lower Trunk Rotation Limitations 10x 10" holds     Lumbar Exercises: Supine   Ab Set 10 reps;5 seconds   AB Set Limitations tactile and verbal cueing   Bridge 10 reps                PT Education - 02/12/16 1704  Education provided Yes   Education Details Reviewed goals, educated on importance of compliance with HEP, copy of eval given to pt.     Person(s) Educated Patient   Methods Explanation;Demonstration;Verbal cues;Handout   Comprehension Verbalized understanding;Returned demonstration;Need further instruction          PT Short Term Goals - 01/30/16 1758      PT SHORT TERM GOAL #1   Title Patient to be able to maintain correct posture at least 75% of the time in order to improve mechanics and reduce discomfort    Time 3   Period Weeks   Status New     PT SHORT TERM GOAL #2   Title Patient to show a reduction in muscle stiffness of all tested groups by at least 50% in order to improve mechanics and reduce pain    Time 3   Period Weeks   Status New     PT SHORT TERM GOAL #3   Title Patient to be independent in correctly and consistently performing appropriate HEP, to be updated PRN    Time 1    Period Weeks   Status New           PT Long Term Goals - 01/30/16 1759      PT LONG TERM GOAL #1   Title Patient to demonstrate B hip mobiltiy with only min limiitation in order to improve mechanics and reduce pain    Time 6   Period Weeks   Status New     PT LONG TERM GOAL #2   Title Patient to demonstrate only minimal lumbar mobility restrictions in order to reduce pain and improve mechanics    Time 6   Period Weeks   Status New     PT LONG TERM GOAL #3   Title Patient to be able to tolerate CKC based tasks for at least 20-30 minutes with no pain exacerbation in order to improve functional task performance    Time 6   Period Weeks   Status New     PT LONG TERM GOAL #4   Title Patient to experience pain no more than 3/10 in order to improve functional task tolerance and performance    Time 6   Period Weeks   Status New     PT LONG TERM GOAL #5   Title Patient to be able to maintain 20 seconds with SLS each LE in order to reduce fall risk and improve mobiltiy    Time 6   Period Weeks   Status New               Plan - 02/12/16 1828    Clinical Impression Statement Reviewed goals, educated on importance of compliance and assured correct technique with HEP and copy of eval given to pt.  Session focus on education on importance of compliance with HEP as well as lumbar mobility.  Therapist facilitation to improve form with all therex.  No reports of increased pain through session.     Rehab Potential Good   Clinical Impairments Affecting Rehab Potential chronicity of symptoms   PT Frequency 2x / week   PT Duration 6 weeks   PT Treatment/Interventions ADLs/Self Care Home Management;Biofeedback;Cryotherapy;Moist Heat;Gait training;Stair training;Functional mobility training;Therapeutic activities;Therapeutic exercise;Balance training;Neuromuscular re-education;Patient/family education;Manual techniques;Passive range of motion;Energy conservation;Taping   PT Next Visit  Plan Review importance of compliance iwht HEP!  Continue focus on hip and lumbar mobility, posture, functional stretching    PT Home Exercise Plan Eval: SKTC, lumbar  rotations       Patient will benefit from skilled therapeutic intervention in order to improve the following deficits and impairments:  Abnormal gait, Improper body mechanics, Pain, Increased fascial restricitons, Increased muscle spasms, Postural dysfunction, Decreased activity tolerance, Decreased range of motion, Hypomobility, Decreased balance, Difficulty walking, Impaired flexibility  Visit Diagnosis: Chronic bilateral low back pain without sciatica  Abnormal posture  Cramp and spasm  Other symptoms and signs involving the musculoskeletal system     Problem List Patient Active Problem List   Diagnosis Date Noted  . Screening examination for venereal disease 02/11/2016  . Critical lower limb ischemia 12/07/2015  . Fall 12/05/2015  . Toe ulcer, right ([place]) 09/19/2015  . Decreased pedal pulses 09/19/2015  . OSA on CPAP 09/05/2015  . Major depressive disorder, recurrent episode, moderate ([place]) 09/05/2015  . OSA (obstructive sleep apnea) 07/25/2015  . Passed out 06/28/2015  . Low back pain 06/11/2015  . Abnormality of gait 06/11/2015  . Dizziness 03/17/2015  . Weakness 02/21/2015  . Chronic renal insufficiency, stage III (moderate) 08/09/2014  . Diabetes ([place]) 11/07/2013  . Hematuria 06/21/2013  . Hepatic steatosis 09/09/2010  . UTI 09/15/2006  . PYURIA 09/03/2006  . Human immunodeficiency virus (HIV) disease ([place]) 06/04/2006  . HERPES ZOSTER, UNCOMPLICATED 81/38/8719  . HSV 06/04/2006  . Depression 06/04/2006  . DISORDER, ATTENTION DEFICIT W/HYPERACTIVITY 06/04/2006  . THROMBOPHLEBITIS NOS 06/04/2006  . GERD 06/04/2006  . ARTHRITIS, HAND 06/04/2006  . HYPERGLYCEMIA, HX OF 06/04/2006  . HEPATITIS B, HX OF 06/04/2006   Ihor Austin, LPTA; [place]  Aldona Lento 02/12/2016, 6:33  PM  [place], [place], [address] Phone: [phone]   Fax:  [phone]  Name: Daniel [name] MRN: [id number] Date of Birth: [birth year]

## 2016-02-14 ENCOUNTER — Ambulatory Visit (HOSPITAL_COMMUNITY): Payer: BLUE CROSS/BLUE SHIELD | Admitting: Physical Therapy

## 2016-02-14 ENCOUNTER — Telehealth (HOSPITAL_COMMUNITY): Payer: Self-pay | Admitting: Physical Therapy

## 2016-02-14 NOTE — Telephone Encounter (Signed)
He will not be here today he can not make this apptment.

## 2016-02-15 ENCOUNTER — Other Ambulatory Visit: Payer: Self-pay | Admitting: Sports Medicine

## 2016-02-21 ENCOUNTER — Ambulatory Visit (HOSPITAL_COMMUNITY): Payer: BLUE CROSS/BLUE SHIELD | Admitting: Physical Therapy

## 2016-02-21 ENCOUNTER — Telehealth (HOSPITAL_COMMUNITY): Payer: Self-pay | Admitting: Physical Therapy

## 2016-02-21 NOTE — Telephone Encounter (Signed)
No show: LMOM reminding pt of missed appointment this evening. Notified of next appt on 02/26/16 at 4:45 and encouraged him to call if unable to make this.   5:47 PM,02/21/16 [place] [phone]

## 2016-02-22 ENCOUNTER — Other Ambulatory Visit: Payer: Self-pay | Admitting: Emergency Medicine

## 2016-02-24 NOTE — Telephone Encounter (Signed)
SS req refill Voltaren gel - per inst, he uses on his knee. Has seen cardiolocy, vascular and podiatry for LE ulcers, ischemia.  Please advise

## 2016-02-25 NOTE — Telephone Encounter (Signed)
Refilled for topical use on knees only, but should discuss this with podiatrist to make sure he knows he is using this med, as well as cardiology. It should be less risk with this topical medication than oral formulation.

## 2016-02-26 ENCOUNTER — Ambulatory Visit (HOSPITAL_COMMUNITY): Payer: BLUE CROSS/BLUE SHIELD | Attending: Physical Medicine and Rehabilitation | Admitting: Physical Therapy

## 2016-02-26 ENCOUNTER — Ambulatory Visit (INDEPENDENT_AMBULATORY_CARE_PROVIDER_SITE_OTHER): Payer: BLUE CROSS/BLUE SHIELD | Admitting: Sports Medicine

## 2016-02-26 ENCOUNTER — Encounter: Payer: Self-pay | Admitting: Sports Medicine

## 2016-02-26 DIAGNOSIS — R252 Cramp and spasm: Secondary | ICD-10-CM | POA: Diagnosis present

## 2016-02-26 DIAGNOSIS — R0989 Other specified symptoms and signs involving the circulatory and respiratory systems: Secondary | ICD-10-CM

## 2016-02-26 DIAGNOSIS — I739 Peripheral vascular disease, unspecified: Secondary | ICD-10-CM

## 2016-02-26 DIAGNOSIS — M545 Low back pain, unspecified: Secondary | ICD-10-CM

## 2016-02-26 DIAGNOSIS — R293 Abnormal posture: Secondary | ICD-10-CM

## 2016-02-26 DIAGNOSIS — L97511 Non-pressure chronic ulcer of other part of right foot limited to breakdown of skin: Secondary | ICD-10-CM

## 2016-02-26 DIAGNOSIS — L97513 Non-pressure chronic ulcer of other part of right foot with necrosis of muscle: Secondary | ICD-10-CM | POA: Insufficient documentation

## 2016-02-26 DIAGNOSIS — I998 Other disorder of circulatory system: Secondary | ICD-10-CM

## 2016-02-26 DIAGNOSIS — E1140 Type 2 diabetes mellitus with diabetic neuropathy, unspecified: Secondary | ICD-10-CM

## 2016-02-26 DIAGNOSIS — I70229 Atherosclerosis of native arteries of extremities with rest pain, unspecified extremity: Secondary | ICD-10-CM

## 2016-02-26 DIAGNOSIS — G8929 Other chronic pain: Secondary | ICD-10-CM | POA: Diagnosis present

## 2016-02-26 DIAGNOSIS — R29898 Other symptoms and signs involving the musculoskeletal system: Secondary | ICD-10-CM

## 2016-02-26 DIAGNOSIS — E1149 Type 2 diabetes mellitus with other diabetic neurological complication: Secondary | ICD-10-CM | POA: Diagnosis not present

## 2016-02-26 DIAGNOSIS — R269 Unspecified abnormalities of gait and mobility: Secondary | ICD-10-CM | POA: Diagnosis present

## 2016-02-26 NOTE — Telephone Encounter (Signed)
L/m for patient with dr greens comments

## 2016-02-26 NOTE — Therapy (Signed)
[place], [place], [address] Phone: [phone]   Fax:  [phone]  Physical Therapy Treatment  Patient Details  Name: Daniel [name] MRN: [id number] Date of Birth: December 17, 1955 Referring Provider: Laroy Apple   Encounter Date: 02/26/2016      PT End of Session - 02/26/16 1740    Visit Number 3   Number of Visits 12   Date for PT Re-Evaluation 03/11/16   Authorization Type BCBS    Authorization Time Period 01/30/16 to 03/12/16   PT Start Time 1645   PT Stop Time 1723   PT Time Calculation (min) 38 min   Activity Tolerance Patient tolerated treatment well   Behavior During Therapy [facility] for tasks assessed/performed      Past Medical History:  Diagnosis Date  . ADHD (attention deficit hyperactivity disorder)   . Anxiety   . Chronic kidney disease   . Clotting disorder ([place])   . Depression   . Diabetes mellitus without complication ([place])   . Diabetes mellitus, type II ([place])   . Dizziness 03/17/2015  . GERD (gastroesophageal reflux disease)   . HIV disease ([place])   . HIV infection ([place])   . Liver disease   . OSA (obstructive sleep apnea) 07/25/2015   Uses CPAP regularly  . Ulcer [facility])     Past Surgical History:  Procedure Laterality Date  . SMALL INTESTINE SURGERY    . STOMACH SURGERY      There were no vitals filed for this visit.      Subjective Assessment - 02/26/16 1646    Subjective Patient states that he is not doing very well, his MD is talking about cutting his toe off; he does not want to do this. He has not been doing his HEP due to being depressed, he reports it is hard for him to do a lot during these spates of depression.    Pertinent History active xarelto, thrombophlebitis, critical LE ischemia with toe ulcers, OSA, DM with neuropathy, HIV, herpes zoster, HSV, hep B, major depressive disorder, history of falls, ADHD, history of seizures    Patient Stated Goals reduce pain in back     Currently in Pain? Yes   Pain Score 1    Pain Location Toe (Comment which one)   Pain Orientation Right   Pain Descriptors / Indicators Throbbing   Pain Type Chronic pain   Pain Radiating Towards none    Pain Onset More than a month ago   Pain Frequency Intermittent                         [facility] Adult PT Treatment/Exercise - 02/26/16 0001      Lumbar Exercises: Stretches   Active Hamstring Stretch 2 reps;30 seconds   Active Hamstring Stretch Limitations supine with towel    Single Knee to Chest Stretch 5 reps;10 seconds   Single Knee to Chest Stretch Limitations towel assist    Lower Trunk Rotation 5 reps;10 seconds   Piriformis Stretch 2 reps;30 seconds     Lumbar Exercises: Standing   Heel Raises 10 reps   Heel Raises Limitations toe only, cannot put pressure on toe for heel raises    Forward Lunge 10 reps   Forward Lunge Limitations 4 inch      Lumbar Exercises: Supine   Ab Set 10 reps;5 seconds   AB Set Limitations verbal cues    Clam 10 reps  Clam Limitations red TB    Bridge 10 reps   Other Supine Lumbar Exercises hip flexor stretch off side of table 2x30 seconds each side    Other Supine Lumbar Exercises supine marches with core set 1x10                 PT Education - 02/26/16 1740    Education provided No          PT Short Term Goals - 01/30/16 1758      PT SHORT TERM GOAL #1   Title Patient to be able to maintain correct posture at least 75% of the time in order to improve mechanics and reduce discomfort    Time 3   Period Weeks   Status New     PT SHORT TERM GOAL #2   Title Patient to show a reduction in muscle stiffness of all tested groups by at least 50% in order to improve mechanics and reduce pain    Time 3   Period Weeks   Status New     PT SHORT TERM GOAL #3   Title Patient to be independent in correctly and consistently performing appropriate HEP, to be updated PRN    Time 1   Period Weeks   Status New            PT Long Term Goals - 01/30/16 1759      PT LONG TERM GOAL #1   Title Patient to demonstrate B hip mobiltiy with only min limiitation in order to improve mechanics and reduce pain    Time 6   Period Weeks   Status New     PT LONG TERM GOAL #2   Title Patient to demonstrate only minimal lumbar mobility restrictions in order to reduce pain and improve mechanics    Time 6   Period Weeks   Status New     PT LONG TERM GOAL #3   Title Patient to be able to tolerate CKC based tasks for at least 20-30 minutes with no pain exacerbation in order to improve functional task performance    Time 6   Period Weeks   Status New     PT LONG TERM GOAL #4   Title Patient to experience pain no more than 3/10 in order to improve functional task tolerance and performance    Time 6   Period Weeks   Status New     PT LONG TERM GOAL #5   Title Patient to be able to maintain 20 seconds with SLS each LE in order to reduce fall risk and improve mobiltiy    Time 6   Period Weeks   Status New               Plan - 02/26/16 1741    Clinical Impression Statement Patient arrives today reporting he has not been doing his HEP due to bout of depression; his MD is aware of this and patient states that this has been a lifelong problem. Focused on lumbar and hip flexibility as well as core stability and LE strengthening. No increased pain throughout session. Patient aware of time/date of next session. Delayed re-assess date as patient has only been to PT 3 times including today.    Rehab Potential Good   Clinical Impairments Affecting Rehab Potential chronicity of symptoms   PT Frequency 2x / week   PT Duration 6 weeks   PT Treatment/Interventions ADLs/Self Care Home Management;Biofeedback;Cryotherapy;Moist Heat;Gait training;Stair training;Functional mobility training;Therapeutic activities;Therapeutic  exercise;Balance training;Neuromuscular re-education;Patient/family education;Manual  techniques;Passive range of motion;Energy conservation;Taping   PT Next Visit Plan Review importance of compliance iwht HEP!  Continue focus on hip and lumbar mobility, posture, functional stretching    PT Home Exercise Plan Eval: SKTC, lumbar rotations    Consulted and Agree with Plan of Care Patient      Patient will benefit from skilled therapeutic intervention in order to improve the following deficits and impairments:  Abnormal gait, Improper body mechanics, Pain, Increased fascial restricitons, Increased muscle spasms, Postural dysfunction, Decreased activity tolerance, Decreased range of motion, Hypomobility, Decreased balance, Difficulty walking, Impaired flexibility  Visit Diagnosis: Chronic bilateral low back pain without sciatica  Abnormal posture  Cramp and spasm  Other symptoms and signs involving the musculoskeletal system     Problem List Patient Active Problem List   Diagnosis Date Noted  . Screening examination for venereal disease 02/11/2016  . Critical lower limb ischemia 12/07/2015  . Fall 12/05/2015  . Toe ulcer, right ([place]) 09/19/2015  . Decreased pedal pulses 09/19/2015  . OSA on CPAP 09/05/2015  . Major depressive disorder, recurrent episode, moderate ([place]) 09/05/2015  . OSA (obstructive sleep apnea) 07/25/2015  . Passed out 06/28/2015  . Low back pain 06/11/2015  . Abnormality of gait 06/11/2015  . Dizziness 03/17/2015  . Weakness 02/21/2015  . Chronic renal insufficiency, stage III (moderate) 08/09/2014  . Diabetes ([place]) 11/07/2013  . Hematuria 06/21/2013  . Hepatic steatosis 09/09/2010  . UTI 09/15/2006  . PYURIA 09/03/2006  . Human immunodeficiency virus (HIV) disease ([place]) 06/04/2006  . HERPES ZOSTER, UNCOMPLICATED 44/81/8563  . HSV 06/04/2006  . Depression 06/04/2006  . DISORDER, ATTENTION DEFICIT W/HYPERACTIVITY 06/04/2006  . THROMBOPHLEBITIS NOS 06/04/2006  . GERD 06/04/2006  . ARTHRITIS, HAND 06/04/2006  . HYPERGLYCEMIA, HX OF  06/04/2006  . HEPATITIS B, HX OF 06/04/2006    Deniece Ree PT, DPT [facility] [address] [place], [place], [address] Phone: [phone]   Fax:  [phone]  Name: Daniel [name] MRN: [id number] Date of Birth: Dec 29, 1955

## 2016-02-26 NOTE — Progress Notes (Signed)
Subjective: Daniel [name] is a 61 y.o. male patient seen in office for f/u evaluation of ulcerations of the right toes has been using betadine and taking antibiotics completed a few weeks ago. States that he went to Dr. Bridgett Larsson who is referring him to another vascular docor. Patient has a history of diabetes and a blood glucose level uncontrolled at previous. Denies nausea/fever/vomiting/chills/night sweats/shortness of breath.  Patient has no other pedal complaints at this time.  Patient Active Problem List   Diagnosis Date Noted  . Screening examination for venereal disease 02/11/2016  . Critical lower limb ischemia 12/07/2015  . Fall 12/05/2015  . Toe ulcer, right ([place]) 09/19/2015  . Decreased pedal pulses 09/19/2015  . OSA on CPAP 09/05/2015  . Major depressive disorder, recurrent episode, moderate ([place]) 09/05/2015  . OSA (obstructive sleep apnea) 07/25/2015  . Passed out 06/28/2015  . Low back pain 06/11/2015  . Abnormality of gait 06/11/2015  . Dizziness 03/17/2015  . Weakness 02/21/2015  . Chronic renal insufficiency, stage III (moderate) 08/09/2014  . Diabetes ([place]) 11/07/2013  . Hematuria 06/21/2013  . Hepatic steatosis 09/09/2010  . UTI 09/15/2006  . PYURIA 09/03/2006  . Human immunodeficiency virus (HIV) disease ([place]) 06/04/2006  . HERPES ZOSTER, UNCOMPLICATED 75/91/6384  . HSV 06/04/2006  . Depression 06/04/2006  . DISORDER, ATTENTION DEFICIT W/HYPERACTIVITY 06/04/2006  . THROMBOPHLEBITIS NOS 06/04/2006  . GERD 06/04/2006  . ARTHRITIS, HAND 06/04/2006  . HYPERGLYCEMIA, HX OF 06/04/2006  . HEPATITIS B, HX OF 06/04/2006   Current Outpatient Prescriptions on File Prior to Visit  Medication Sig Dispense Refill  . abacavir-dolutegravir-lamiVUDine (TRIUMEQ) 600-50-300 MG tablet Take 1 tablet by mouth daily. 90 tablet 1  . acetaminophen (TYLENOL) 500 MG tablet Take 1,000 mg by mouth every 8 (eight) hours as needed for moderate pain.     [name] ALPRAZolam (XANAX) 1 MG tablet Take  1 mg by mouth 3 (three) times daily as needed for anxiety.     [name] amoxicillin-clavulanate (AUGMENTIN) 875-125 MG tablet TAKE (1) TABLET BY MOUTH TWICE DAILY. 28 tablet 0  . amphetamine-dextroamphetamine (ADDERALL) 30 MG tablet Take 30 mg by mouth 3 (three) times daily.     [name] atorvastatin (LIPITOR) 10 MG tablet TAKE ONE TABLET BY MOUTH ONCE DAILY. 90 tablet 11  . Blood Glucose Monitoring Suppl (BLOOD GLUCOSE METER KIT AND SUPPLIES) Dispense based on patient and insurance preference. Use up to four times daily as directed. (FOR ICD-9 250.00, 250.01). 1 each 0  . diclofenac sodium (VOLTAREN) 1 % GEL APPLY 2 GRAMS TO EACH KNEE IN THE MORNING AND AT BEDTIME AND APPLY 1 GRAM TO EACH KNEE IN THE AFTERNOON. 300 g 0  . divalproex (DEPAKOTE ER) 500 MG 24 hr tablet Take 1 tablet (500 mg total) by mouth at bedtime. 90 tablet 4  . glucose blood test strip Test blood sugar daily. Dx E11.9 50 each 11  . insulin aspart (NOVOLOG FLEXPEN) 100 UNIT/ML FlexPen 3 times a day (just before each meal) 06-28-08 units 15 mL 11  . Insulin Glargine (LANTUS SOLOSTAR) 100 UNIT/ML Solostar Pen Inject 65 Units into the skin every morning. 10 pen PRN  . Insulin Pen Needle (PEN NEEDLES 3/16") 31G X 5 MM MISC 1 Device by Other route 4 (four) times daily. Use to inject Lantus daily. 120 each 11  . levothyroxine (SYNTHROID, LEVOTHROID) 50 MCG tablet Take 1 tablet (50 mcg total) by mouth at bedtime. 30 tablet 11  . LYRICA 75 MG capsule TAKE ONE CAPSULE BY MOUTH THREE TIMES  DAILY. 90 capsule 2  . ondansetron (ZOFRAN-ODT) 4 MG disintegrating tablet Take 2 tablets (8 mg total) by mouth every 8 (eight) hours as needed for nausea or vomiting. 60 tablet 5  . protriptyline (VIVACTIL) 10 MG tablet   11  . ranitidine (ZANTAC) 150 MG tablet Take 150 mg by mouth daily.    . rivaroxaban (XARELTO) 15 MG TABS tablet Take 1 tablet (15 mg total) by mouth daily. 30 tablet 11  . TRINTELLIX 20 MG TABS Take 20 mg by mouth at bedtime.    [name] UNABLE TO FIND CPAP  MACHINE with standard [employer] nasal mask with humidifier. Set at 14 cwp 1 each 0  . zolpidem (AMBIEN) 10 MG tablet Take 10-20 mg by mouth at bedtime as needed for sleep.      No current facility-administered medications on file prior to visit.    Allergies  Allergen Reactions  . Aspirin Swelling  . Ibuprofen Swelling  . Sustiva [Efavirenz] Swelling and Rash    And rash.  . Nsaids Other (See Comments)    unknwn    Recent Results (from the past 2160 hour(s))  T-helper cell (CD4)- ([facility] only)     Status: Abnormal   Collection Time: 01/28/16  3:00 PM  Result Value Ref Range   CD4 T Cell Abs 800 400 - 2,700 /uL   CD4 % Helper T Cell 32 (L) 33 - 55 %    Comment: Performed at [facility]  HIV 1 RNA quant-no reflex-bld     Status: None   Collection Time: 01/28/16  4:01 PM  Result Value Ref Range   HIV 1 RNA Quant <20 <20 copies/mL    Comment:                                 NOT DETECTED   HIV1 RNA Quant, Log <1.30 <1.30 Log copies/mL    Comment:   This test was performed using the COBAS AmpliPrep/COBAS TaqMan HIV-1 test kit version 2.0. ([place].)   POCT glycosylated hemoglobin (Hb A1C)     Status: None   Collection Time: 02/04/16  2:25 PM  Result Value Ref Range   Hemoglobin A1C 8.9     Objective: There were no vitals filed for this visit.  General: Patient is awake, alert, oriented x 3 and in no acute distress.  Dermatology: Skin is warm and dry bilateral with a Partial thickness ulceration present Plantar right hallux, medial 2nd toe with multiple digital eschars with early necrosis and dry localized gangrenous changes to the toes on right foot, slowly improving. Ulcerations measures <0.4cm, slightly improved in appearance with less weeping since last encounter, There are mixed fibro-granular tissue present with surrounding eschar and necrosis. The ulcerations do not probe to bone. There is no malodor, no active drainage, +  decreasedfocal erythema, + focal edema. No other acute signs of infection. No other open lesions.    Vascular: Dorsalis Pedis pulse = +4/4 on right, left 0/4,  Posterior Tibial pulse = 0/4 Bilateral,  Capillary Fill Time < 5 seconds. 1+ pitting edema and mild varicosities.  Neurologic: Protective sensation severely diminished to the level of the ankles with the 5.07/10g [employer].  Musculosketal: No Pain with palpation to ulcerated areas. No pain with compression to calves bilateral.  Assessment and Plan:  Problem List Items Addressed This Visit      Cardiovascular and Mediastinum  Critical lower limb ischemia    Other Visit Diagnoses    Toe ulcer, right, limited to breakdown of skin ([name])    -  Primary   Diminished pulses in lower extremity       PAD (peripheral artery disease) ([name])       Diabetic neuropathy with neurologic complication ([place])         -Examined patient and discussed the progression of the wounds and treatment alternatives. -Patient is awaiting vascular follow up; New referral placed and patient reports that Dr. Lianne Moris office is sending him to another specialist  -Debrided eschars using tissue nipper on right foot and applied betadine and dry sterile dressing and instructed patient to continue with daily dressings at home consisting of the same covered with dry sterile dressing with care to avoid wrapping dressings to tightly around toes. -Advised patient to go to the ER if worsens -Continue with postoperative shoe -Patient to return to office in 2-3 weeks for follow up ulcer care and evaluation or sooner if problems arise.  Landis Martins, DPM

## 2016-02-28 ENCOUNTER — Other Ambulatory Visit: Payer: Self-pay

## 2016-02-28 ENCOUNTER — Ambulatory Visit (HOSPITAL_COMMUNITY): Payer: BLUE CROSS/BLUE SHIELD | Admitting: Physical Therapy

## 2016-02-28 ENCOUNTER — Telehealth (HOSPITAL_COMMUNITY): Payer: Self-pay | Admitting: Physical Therapy

## 2016-02-28 NOTE — Telephone Encounter (Signed)
he will not be here today, pt did not say why.

## 2016-02-28 NOTE — Telephone Encounter (Signed)
Spoke with pharmacist as to why Lyrica has been approved since 2016 and now we need a PA.  Spoke with Cover[employer] meds and agent stated due to new insurance year!  PA completed. Pt started on Gabapentin 07/08/2011 Changed to Lyrica 08/09/2014.  Pt was having balance, gait difficulties with pain.  Dx  E11.40 Type 2 DM with neuropathy and long term use of insulin.

## 2016-03-04 ENCOUNTER — Telehealth: Payer: Self-pay

## 2016-03-04 ENCOUNTER — Ambulatory Visit (HOSPITAL_COMMUNITY): Payer: BLUE CROSS/BLUE SHIELD | Admitting: Physical Therapy

## 2016-03-04 DIAGNOSIS — R293 Abnormal posture: Secondary | ICD-10-CM

## 2016-03-04 DIAGNOSIS — G8929 Other chronic pain: Secondary | ICD-10-CM

## 2016-03-04 DIAGNOSIS — M545 Low back pain, unspecified: Secondary | ICD-10-CM

## 2016-03-04 DIAGNOSIS — R252 Cramp and spasm: Secondary | ICD-10-CM

## 2016-03-04 DIAGNOSIS — R29898 Other symptoms and signs involving the musculoskeletal system: Secondary | ICD-10-CM

## 2016-03-04 NOTE — Telephone Encounter (Signed)
Pa needed for lyrica   Cover my meds key [id number]

## 2016-03-04 NOTE — Therapy (Signed)
Daniel [name], [place], [address] Phone: [phone]   Fax:  [phone]  Physical Therapy Treatment  Patient Details  Name: Daniel [name] MRN: [id number] Date of Birth: 04-Jul-1955 Referring Provider: Laroy Apple   Encounter Date: 03/04/2016      PT End of Session - 03/04/16 1730    Visit Number 3   Number of Visits 12   Date for PT Re-Evaluation 03/11/16   Authorization Type BCBS    Authorization Time Period 01/30/16 to 03/12/16   PT Start Time 1645   PT Stop Time 1730   PT Time Calculation (min) 45 min   Activity Tolerance Patient tolerated treatment well   Behavior During Therapy [facility] for tasks assessed/performed      Past Medical History:  Diagnosis Date  . ADHD (attention deficit hyperactivity disorder)   . Anxiety   . Chronic kidney disease   . Clotting disorder ([place])   . Depression   . Diabetes mellitus without complication ([place])   . Diabetes mellitus, type II ([place])   . Dizziness 03/17/2015  . GERD (gastroesophageal reflux disease)   . HIV disease ([place])   . HIV infection ([place])   . Liver disease   . OSA (obstructive sleep apnea) 07/25/2015   Uses CPAP regularly  . Ulcer [facility])     Past Surgical History:  Procedure Laterality Date  . SMALL INTESTINE SURGERY    . STOMACH SURGERY      There were no vitals filed for this visit.      Subjective Assessment - 03/04/16 1649    Subjective Pt reports he cannot get in the right state of mind to complete his HEP. Continued depression due to insurance denials for meds and his multitude of medical problems.  Reports he is seeing a psychologist for his mental issues   Pertinent History active xarelto, thrombophlebitis, critical LE ischemia with toe ulcers, OSA, DM with neuropathy, HIV, herpes zoster, HSV, hep B, major depressive disorder, history of falls, ADHD, history of seizures    Currently in Pain? Yes   Pain Score 2    Pain Location Back   Pain  Orientation Medial;Mid;Right   Pain Descriptors / Indicators Throbbing   Pain Type Chronic pain   Pain Frequency Constant                         [facility] Adult PT Treatment/Exercise - 03/04/16 0001      Lumbar Exercises: Stretches   Active Hamstring Stretch 2 reps;30 seconds   Active Hamstring Stretch Limitations supine with rope   Single Knee to Chest Stretch 5 reps;10 seconds   Single Knee to Chest Stretch Limitations towel assist    Lower Trunk Rotation 5 reps;10 seconds   Piriformis Stretch 2 reps;30 seconds     Lumbar Exercises: Supine   Ab Set 10 reps;5 seconds   AB Set Limitations verbal cues    Bridge 15 reps   Other Supine Lumbar Exercises supine marches with core set 1x10                 PT Education - 03/04/16 1700    Education provided Yes   Education Details educated on importance of completing HEP for gains.  Informed of woundcare specialists here at our facility if warranted.    Person(s) Educated Patient   Methods Explanation   Comprehension Verbalized understanding;Need further instruction  PT Short Term Goals - 01/30/16 1758      PT SHORT TERM GOAL #1   Title Patient to be able to maintain correct posture at least 75% of the time in order to improve mechanics and reduce discomfort    Time 3   Period Weeks   Status New     PT SHORT TERM GOAL #2   Title Patient to show a reduction in muscle stiffness of all tested groups by at least 50% in order to improve mechanics and reduce pain    Time 3   Period Weeks   Status New     PT SHORT TERM GOAL #3   Title Patient to be independent in correctly and consistently performing appropriate HEP, to be updated PRN    Time 1   Period Weeks   Status New           PT Long Term Goals - 01/30/16 1759      PT LONG TERM GOAL #1   Title Patient to demonstrate B hip mobiltiy with only min limiitation in order to improve mechanics and reduce pain    Time 6   Period Weeks    Status New     PT LONG TERM GOAL #2   Title Patient to demonstrate only minimal lumbar mobility restrictions in order to reduce pain and improve mechanics    Time 6   Period Weeks   Status New     PT LONG TERM GOAL #3   Title Patient to be able to tolerate CKC based tasks for at least 20-30 minutes with no pain exacerbation in order to improve functional task performance    Time 6   Period Weeks   Status New     PT LONG TERM GOAL #4   Title Patient to experience pain no more than 3/10 in order to improve functional task tolerance and performance    Time 6   Period Weeks   Status New     PT LONG TERM GOAL #5   Title Patient to be able to maintain 20 seconds with SLS each LE in order to reduce fall risk and improve mobiltiy    Time 6   Period Weeks   Status New               Plan - 03/04/16 1732    Clinical Impression Statement Pt very preoccupied with financial and medical concerns this session.  Worried about his co-pays and his toe that is not healing.  Informed pateint about our woundcare services if his MD thought it was needed.  Pt continues to be non-compliant with HEP and requires constant cues to complete correctly when doing therex as well as encouragement.  No new exercises added this session.   Rehab Potential Good   Clinical Impairments Affecting Rehab Potential chronicity of symptoms   PT Frequency 2x / week   PT Duration 6 weeks   PT Treatment/Interventions ADLs/Self Care Home Management;Biofeedback;Cryotherapy;Moist Heat;Gait training;Stair training;Functional mobility training;Therapeutic activities;Therapeutic exercise;Balance training;Neuromuscular re-education;Patient/family education;Manual techniques;Passive range of motion;Energy conservation;Taping   PT Next Visit Plan Review importance of compliance iwht HEP!  Continue focus on hip and lumbar mobility, posture, functional stretching    PT Home Exercise Plan Eval: SKTC, lumbar rotations    Consulted  and Agree with Plan of Care Patient      Patient will benefit from skilled therapeutic intervention in order to improve the following deficits and impairments:  Abnormal gait, Improper body mechanics, Pain, Increased  fascial restricitons, Increased muscle spasms, Postural dysfunction, Decreased activity tolerance, Decreased range of motion, Hypomobility, Decreased balance, Difficulty walking, Impaired flexibility  Visit Diagnosis: Chronic bilateral low back pain without sciatica  Abnormal posture  Cramp and spasm  Other symptoms and signs involving the musculoskeletal system     Problem List Patient Active Problem List   Diagnosis Date Noted  . Screening examination for venereal disease 02/11/2016  . Critical lower limb ischemia 12/07/2015  . Fall 12/05/2015  . Toe ulcer, right ([place]) 09/19/2015  . Decreased pedal pulses 09/19/2015  . OSA on CPAP 09/05/2015  . Major depressive disorder, recurrent episode, moderate ([place]) 09/05/2015  . OSA (obstructive sleep apnea) 07/25/2015  . Passed out 06/28/2015  . Low back pain 06/11/2015  . Abnormality of gait 06/11/2015  . Dizziness 03/17/2015  . Weakness 02/21/2015  . Chronic renal insufficiency, stage III (moderate) 08/09/2014  . Diabetes ([place]) 11/07/2013  . Hematuria 06/21/2013  . Hepatic steatosis 09/09/2010  . UTI 09/15/2006  . PYURIA 09/03/2006  . Human immunodeficiency virus (HIV) disease ([place]) 06/04/2006  . HERPES ZOSTER, UNCOMPLICATED 08/17/7626  . HSV 06/04/2006  . Depression 06/04/2006  . DISORDER, ATTENTION DEFICIT W/HYPERACTIVITY 06/04/2006  . THROMBOPHLEBITIS NOS 06/04/2006  . GERD 06/04/2006  . ARTHRITIS, HAND 06/04/2006  . HYPERGLYCEMIA, HX OF 06/04/2006  . HEPATITIS B, HX OF 06/04/2006    Teena Irani, PTA/CLT [phone] 03/04/2016, 5:43 PM  [facility] [address] [place], [place], [address] Phone: [phone]   Fax:  [phone]  Name: SHRAVAN SALAHUDDIN MRN: [id number] Date of Birth: 08-31-1955

## 2016-03-05 ENCOUNTER — Encounter: Payer: Self-pay | Admitting: Sports Medicine

## 2016-03-05 NOTE — Telephone Encounter (Signed)
Key not working for [employer], need to call pharmacy for new #

## 2016-03-06 ENCOUNTER — Telehealth (HOSPITAL_COMMUNITY): Payer: Self-pay | Admitting: Physical Therapy

## 2016-03-06 ENCOUNTER — Ambulatory Visit (HOSPITAL_COMMUNITY): Payer: BLUE CROSS/BLUE SHIELD

## 2016-03-06 ENCOUNTER — Telehealth: Payer: Self-pay | Admitting: *Deleted

## 2016-03-06 DIAGNOSIS — R0989 Other specified symptoms and signs involving the circulatory and respiratory systems: Secondary | ICD-10-CM

## 2016-03-06 DIAGNOSIS — L97511 Non-pressure chronic ulcer of other part of right foot limited to breakdown of skin: Secondary | ICD-10-CM

## 2016-03-06 NOTE — Telephone Encounter (Signed)
[name] ordered Wound Care at [facility]. Pt requested location. I faxed referral, clinicals and demographics to [facility].

## 2016-03-06 NOTE — Telephone Encounter (Signed)
Will put PT on hold and schedule Wound Eval going forward. L/m for pt to call us back to schedule Wound eval. NF 03/06/16

## 2016-03-10 NOTE — Telephone Encounter (Signed)
Pharmacy said its been denied  [id number] key

## 2016-03-11 ENCOUNTER — Ambulatory Visit: Payer: BLUE CROSS/BLUE SHIELD | Admitting: Sports Medicine

## 2016-03-11 NOTE — Telephone Encounter (Signed)
Spoke to patient, and reviewed prior chart to determine reason for change from gabapentin to Lyrica. It appears that during that time his renal function was worsening and there was concern that the gabapentin may have been contributing as gabapentin may affect kidney function.   I attempted call to [facility], but closed at this time. Please try calling again with information from June 2016 office visit where he was changed from gabapentin to Lyrica due to concerns for worsening renal function, as it should be covered with that additional information. If I still need to place a call, let me know.

## 2016-03-11 NOTE — Telephone Encounter (Signed)
lyrica denied because he has only tried gabepentin and cymbalta is another one to try and fail. There is a form in your box with a number to call for provider to pharmacist review to expedite the Pa.  Patient has been out of meds for several days.

## 2016-03-11 NOTE — Telephone Encounter (Signed)
Santiago Glad, I spoke to Mr Wence. This is 3 month renewal period for this year which requires PA approval. I told him we will call him after PA is done. The doctor will need to complete the reason to continue Lyrica

## 2016-03-11 NOTE — Telephone Encounter (Signed)
Spoke with cover my meds "We Need to call bcbs [phone]" Called.

## 2016-03-11 NOTE — Telephone Encounter (Signed)
THEY WILL FAX ME A FORM TO FILL OUT

## 2016-03-12 ENCOUNTER — Ambulatory Visit (HOSPITAL_COMMUNITY): Payer: BLUE CROSS/BLUE SHIELD | Admitting: Physical Therapy

## 2016-03-14 ENCOUNTER — Ambulatory Visit: Payer: BLUE CROSS/BLUE SHIELD | Admitting: Sports Medicine

## 2016-03-17 ENCOUNTER — Other Ambulatory Visit: Payer: Self-pay | Admitting: Sports Medicine

## 2016-03-17 ENCOUNTER — Telehealth: Payer: Self-pay | Admitting: *Deleted

## 2016-03-17 ENCOUNTER — Telehealth: Payer: Self-pay

## 2016-03-17 NOTE — Telephone Encounter (Signed)
PA still needs md to pharmacy appeal see other note and advise

## 2016-03-17 NOTE — Telephone Encounter (Signed)
Patient is calling back about his Lyrica prescription.  Patient states that the pharmacy still hasn't gotten insurance approval for it.  He states that Dr. Carlota Raspberry was trying to work on getting it approved for him but they are still denying this medication.  He states that if it would be easier to take something else besides Lyrica, that would be an option for him. Please advise   [phone]

## 2016-03-17 NOTE — Telephone Encounter (Addendum)
Pt's pharmacy request refill of Augmentin. 03/18/2016-Left message informing pt the Augmentin was filled and all other medication would come from [place] and that his 1st appt there was 03/19/2016 at 2:30pm.

## 2016-03-18 MED ORDER — AMOXICILLIN-POT CLAVULANATE 875-125 MG PO TABS: ORAL_TABLET | ORAL | 0 refills | Status: DC

## 2016-03-18 NOTE — Telephone Encounter (Signed)
We can refill until he sees wound care. Thanks [name]

## 2016-03-19 ENCOUNTER — Ambulatory Visit (HOSPITAL_COMMUNITY): Payer: BLUE CROSS/BLUE SHIELD | Admitting: Physical Therapy

## 2016-03-19 DIAGNOSIS — M545 Low back pain: Secondary | ICD-10-CM | POA: Diagnosis not present

## 2016-03-19 DIAGNOSIS — L97513 Non-pressure chronic ulcer of other part of right foot with necrosis of muscle: Secondary | ICD-10-CM

## 2016-03-19 DIAGNOSIS — R269 Unspecified abnormalities of gait and mobility: Secondary | ICD-10-CM

## 2016-03-19 NOTE — Telephone Encounter (Signed)
I called [employer] today. Went through multiple phone prompts, and ultimately was told that "due to unforeseen circumstances, they were unable to take my call at this time." I do see a general authorization form from [place] in my inbox and will try faxing that form regarding Lyrica.   Justification of Lyrica based on previous concern for worsening renal function on gabapentin, and risk of serotonin syndrome with combination of other chronic medications and Cymbalta, so that med has not been started.   Based on telephone note 08/14/2014, the prior authorization was apparently approved through June 17 of this year.  In the meantime if he wants to try low dose gabapentin with close monitoring of hs renal function (BMP with lab only visit once per week), we can try that until Lyrica authorized.  Let me know what he would like to do.

## 2016-03-20 ENCOUNTER — Ambulatory Visit (INDEPENDENT_AMBULATORY_CARE_PROVIDER_SITE_OTHER): Payer: BLUE CROSS/BLUE SHIELD | Admitting: Sports Medicine

## 2016-03-20 ENCOUNTER — Ambulatory Visit: Payer: BLUE CROSS/BLUE SHIELD | Admitting: Skilled Nursing Facility1

## 2016-03-20 ENCOUNTER — Encounter: Payer: Self-pay | Admitting: Sports Medicine

## 2016-03-20 DIAGNOSIS — E1140 Type 2 diabetes mellitus with diabetic neuropathy, unspecified: Secondary | ICD-10-CM | POA: Diagnosis not present

## 2016-03-20 DIAGNOSIS — L97511 Non-pressure chronic ulcer of other part of right foot limited to breakdown of skin: Secondary | ICD-10-CM

## 2016-03-20 DIAGNOSIS — I739 Peripheral vascular disease, unspecified: Secondary | ICD-10-CM

## 2016-03-20 DIAGNOSIS — R0989 Other specified symptoms and signs involving the circulatory and respiratory systems: Secondary | ICD-10-CM | POA: Diagnosis not present

## 2016-03-20 DIAGNOSIS — E1149 Type 2 diabetes mellitus with other diabetic neurological complication: Secondary | ICD-10-CM | POA: Diagnosis not present

## 2016-03-20 NOTE — Telephone Encounter (Signed)
Key [id number]

## 2016-03-20 NOTE — Progress Notes (Signed)
Subjective: Daniel [name] is a 61 y.o. male patient seen in office for f/u evaluation of ulcerations of the right toes has been using betadine and taking antibiotics completed a few weeks ago. States that he went to Dr. Bridgett Larsson who is referring him to another vascular docor and also went to wound care center on yesterday but does not want to go back because he does not think that they can offer him anything more than what we have already been doing. Patient has a history of diabetes and a blood glucose level uncontrolled as previously noted. Denies nausea/fever/vomiting/chills/night sweats/shortness of breath.  Patient has no other pedal complaints at this time.  Patient Active Problem List   Diagnosis Date Noted  . Screening examination for venereal disease 02/11/2016  . Critical lower limb ischemia 12/07/2015  . Fall 12/05/2015  . Toe ulcer, right ([place]) 09/19/2015  . Decreased pedal pulses 09/19/2015  . OSA on CPAP 09/05/2015  . Major depressive disorder, recurrent episode, moderate ([place]) 09/05/2015  . OSA (obstructive sleep apnea) 07/25/2015  . Passed out 06/28/2015  . Low back pain 06/11/2015  . Abnormality of gait 06/11/2015  . Dizziness 03/17/2015  . Weakness 02/21/2015  . Chronic renal insufficiency, stage III (moderate) 08/09/2014  . Diabetes ([place]) 11/07/2013  . Hematuria 06/21/2013  . Hepatic steatosis 09/09/2010  . UTI 09/15/2006  . PYURIA 09/03/2006  . Human immunodeficiency virus (HIV) disease ([place]) 06/04/2006  . HERPES ZOSTER, UNCOMPLICATED 18/29/9371  . HSV 06/04/2006  . Depression 06/04/2006  . DISORDER, ATTENTION DEFICIT W/HYPERACTIVITY 06/04/2006  . THROMBOPHLEBITIS NOS 06/04/2006  . GERD 06/04/2006  . ARTHRITIS, HAND 06/04/2006  . HYPERGLYCEMIA, HX OF 06/04/2006  . HEPATITIS B, HX OF 06/04/2006   Current Outpatient Prescriptions on File Prior to Visit  Medication Sig Dispense Refill  . abacavir-dolutegravir-lamiVUDine (TRIUMEQ) 600-50-300 MG tablet Take 1 tablet  by mouth daily. 90 tablet 1  . acetaminophen (TYLENOL) 500 MG tablet Take 1,000 mg by mouth every 8 (eight) hours as needed for moderate pain.     [name] ALPRAZolam (XANAX) 1 MG tablet Take 1 mg by mouth 3 (three) times daily as needed for anxiety.     [name] amoxicillin-clavulanate (AUGMENTIN) 875-125 MG tablet TAKE (1) TABLET BY MOUTH TWICE DAILY. 28 tablet 0  . amphetamine-dextroamphetamine (ADDERALL) 30 MG tablet Take 30 mg by mouth 3 (three) times daily.     [name] atorvastatin (LIPITOR) 10 MG tablet TAKE ONE TABLET BY MOUTH ONCE DAILY. 90 tablet 11  . Blood Glucose Monitoring Suppl (BLOOD GLUCOSE METER KIT AND SUPPLIES) Dispense based on patient and insurance preference. Use up to four times daily as directed. (FOR ICD-9 250.00, 250.01). 1 each 0  . diclofenac sodium (VOLTAREN) 1 % GEL APPLY 2 GRAMS TO EACH KNEE IN THE MORNING AND AT BEDTIME AND APPLY 1 GRAM TO EACH KNEE IN THE AFTERNOON. 300 g 0  . divalproex (DEPAKOTE ER) 500 MG 24 hr tablet Take 1 tablet (500 mg total) by mouth at bedtime. 90 tablet 4  . glucose blood test strip Test blood sugar daily. Dx E11.9 50 each 11  . insulin aspart (NOVOLOG FLEXPEN) 100 UNIT/ML FlexPen 3 times a day (just before each meal) 06-28-08 units 15 mL 11  . Insulin Glargine (LANTUS SOLOSTAR) 100 UNIT/ML Solostar Pen Inject 65 Units into the skin every morning. 10 pen PRN  . Insulin Pen Needle (PEN NEEDLES 3/16") 31G X 5 MM MISC 1 Device by Other route 4 (four) times daily. Use to inject Lantus daily. [place]  each 11  . levothyroxine (SYNTHROID, LEVOTHROID) 50 MCG tablet Take 1 tablet (50 mcg total) by mouth at bedtime. 30 tablet 11  . LYRICA 75 MG capsule TAKE ONE CAPSULE BY MOUTH THREE TIMES DAILY. 90 capsule 2  . ondansetron (ZOFRAN-ODT) 4 MG disintegrating tablet Take 2 tablets (8 mg total) by mouth every 8 (eight) hours as needed for nausea or vomiting. 60 tablet 5  . protriptyline (VIVACTIL) 10 MG tablet   11  . ranitidine (ZANTAC) 150 MG tablet Take 150 mg by mouth daily.     . rivaroxaban (XARELTO) 15 MG TABS tablet Take 1 tablet (15 mg total) by mouth daily. 30 tablet 11  . TRINTELLIX 20 MG TABS Take 20 mg by mouth at bedtime.    [name] UNABLE TO FIND CPAP MACHINE with standard [employer] nasal mask with humidifier. Set at 14 cwp 1 each 0  . zolpidem (AMBIEN) 10 MG tablet Take 10-20 mg by mouth at bedtime as needed for sleep.      No current facility-administered medications on file prior to visit.    Allergies  Allergen Reactions  . Aspirin Swelling  . Ibuprofen Swelling  . Sustiva [Efavirenz] Swelling and Rash    And rash.  . Nsaids Other (See Comments)    unknwn    Recent Results (from the past 2160 hour(s))  HM DIABETES EYE EXAM     Status: None   Collection Time: 01/08/16  9:25 AM  Result Value Ref Range   HM Diabetic Eye Exam  No Retinopathy  T-helper cell (CD4)- ([facility] clinic only)     Status: Abnormal   Collection Time: 01/28/16  3:00 PM  Result Value Ref Range   CD4 T Cell Abs 800 400 - 2,700 /uL   CD4 % Helper T Cell 32 (L) 33 - 55 %    Comment: Performed at [facility]  HIV 1 RNA quant-no reflex-bld     Status: None   Collection Time: 01/28/16  4:01 PM  Result Value Ref Range   HIV 1 RNA Quant <20 <20 copies/mL    Comment:                                 NOT DETECTED   HIV1 RNA Quant, Log <1.30 <1.30 Log copies/mL    Comment:   This test was performed using the COBAS AmpliPrep/COBAS TaqMan HIV-1 test kit version 2.0. ([place].)   POCT glycosylated hemoglobin (Hb A1C)     Status: None   Collection Time: 02/04/16  2:25 PM  Result Value Ref Range   Hemoglobin A1C 8.9     Objective: There were no vitals filed for this visit.  General: Patient is awake, alert, oriented x 3 and in no acute distress.  Dermatology: Skin is warm and dry bilateral with a Partial thickness ulceration present Plantar right hallux,  measures 1x0.5 cm with granular base, slightly improved in appearance with less weeping  since last encounter, ulcers that were previously at and in between other toes are healed, The ulceration does not probe to bone. There is no malodor, no active drainage, + decreased focal erythema, + focal edema. No other acute signs of infection. No other open lesions.    Vascular: Dorsalis Pedis pulse = +4/4 on right, left 0/4,  Posterior Tibial pulse = 0/4 Bilateral,  Capillary Fill Time < 5 seconds. 1+ pitting edema and mild varicosities.  Neurologic:  Protective sensation severely diminished to the level of the ankles with the 5.07/10g [employer].  Musculosketal: No Pain with palpation to ulcerated areas. No pain with compression to calves bilateral.  Assessment and Plan:  Problem List Items Addressed This Visit    None    Visit Diagnoses    Ulcer of toe of right foot, limited to breakdown of skin ([name])    -  Primary   hallux   Diminished pulses in lower extremity       PAD (peripheral artery disease) ([name])       Diabetic neuropathy with neurologic complication ([place])         -Examined patient and discussed the progression of the wounds and treatment alternatives. -Patient is awaiting vascular follow up;tDr. Chen's office is sending him to another specialist; I encouraged patient to call to follow up  -Excisionally debrided right hallux ulcer using tissue nipper and applied iodosorb and dry sterile dressing and instructed patient to continue with daily dressings at home consisting of the same or betadine covered with dry sterile dressing with care to avoid wrapping dressings to tightly around toes. -Advised patient since he is able to do his own dressing that wound care clinic is not necessary  -Advised patient to go to the ER if worsens -Continue with postoperative shoe -Patient to return to office in 2-3 weeks for follow up ulcer care and evaluation or sooner if problems arise.  Landis Martins, DPM

## 2016-03-20 NOTE — Therapy (Addendum)
[place], [place], [address] Phone: [phone]   Fax:  [phone]  Wound Care Evaluation  Patient Details  Name: Daniel [name] MRN: [id number] Date of Birth: December 02, 1955 Referring Provider: Landis Martins   Encounter Date: 03/19/2016      PT End of Session - 03/19/16 1641    Visit Number 1   Number of Visits 6   Date for PT Re-Evaluation 04/18/16   Authorization Type [employer] cross blue shield    PT Start Time 1430   PT Stop Time 1515   PT Time Calculation (min) 45 min   Activity Tolerance Patient tolerated treatment well   Behavior During Therapy [facility] for tasks assessed/performed      Past Medical History:  Diagnosis Date  . ADHD (attention deficit hyperactivity disorder)   . Anxiety   . Chronic kidney disease   . Clotting disorder ([place])   . Depression   . Diabetes mellitus without complication ([place])   . Diabetes mellitus, type II ([place])   . Dizziness 03/17/2015  . GERD (gastroesophageal reflux disease)   . HIV disease ([place])   . HIV infection ([place])   . Liver disease   . OSA (obstructive sleep apnea) 07/25/2015   Uses CPAP regularly  . Ulcer [facility])     Past Surgical History:  Procedure Laterality Date  . SMALL INTESTINE SURGERY    . STOMACH SURGERY      There were no vitals filed for this visit.        [facility] PT Assessment - 03/20/16 0001      Assessment   Medical Diagnosis Non healing wounds   Referring Provider Landis Martins    Onset Date/Surgical Date 10/09/15   Next MD Visit 03/20/2016   Prior Therapy podiatrist      Precautions   Precautions Anterior Hip     Balance Screen   Has the patient fallen in the past 6 months No   Has the patient had a decrease in activity level because of a fear of falling?  No   Is the patient reluctant to leave their home because of a fear of falling?  No         Wound Therapy - 03/19/16 1617    Subjective [name] states that he has had his  wounds on his toes on his right foot for the past five months.  He had ABI on 09/28/2015 that were wnl, he is diabetic and admits to not checking his sugars.  He has been seeing a podiatrist every other week who has been having him but betadine on his feet. The wounds were not improving therefore he requested to be seen in out clinic.  He has peripheral neuropathy therefore there is no pain associated with his wounds.    Patient and Family Stated Goals wounds to be healed   Date of Onset 10/09/15   Prior Treatments betadine, seeing podiatrist every other week .   Pain Score 0-No pain  PT has chronic back pain but we are not addressing this    Evaluation and Treatment Procedures Explained to Patient/Family Yes   Evaluation and Treatment Procedures agreed to   Wound Properties Date First Assessed: 03/19/16 Time First Assessed: 1431 Wound Type: Other (Comment) Location: Toe (Comment  which one) , great  Location Orientation: Right;Medial Wound Description (Comments): halo callous    Dressing Type Gauze (Comment)   Dressing Changed Changed   Dressing Status Dry   Dressing  Change Frequency PRN   Site / Wound Assessment Dry;Dusky   % Wound base Red or Granulating 0%   % Wound base Other/Granulation Tissue (Comment) 100%   Peri-wound Assessment Other (Comment)  calloused   Wound Length (cm) 0.9 cm   Wound Width (cm) 0.5 cm   Wound Depth (cm) 0.2 cm   Undermining (cm) all edgesup to 1.0 cm    Drainage Amount None   Treatment Cleansed;Debridement (Selective)   Wound Properties Date First Assessed: 03/19/16 Time First Assessed: 1435 Wound Type: Other (Comment) Location: Toe (Comment  which one) , 2nd  Location Orientation: Right Wound Description (Comments): .3x.2.x.2 clean, dressing only with silver alginate.  Toe is red.  Present on Admission: Yes   Selective Debridement - Location callous   Selective Debridement - Tools Used Scalpel;Scissors   Selective Debridement - Tissue Removed --  callous    Wound Therapy - Clinical Statement Mr. Moragne is an uncontroled diabetic who has had non-healing wounds for five months.  He openly states that he does not check his blood sugars on a regular basis.  His wounds on his toes appear to be arterial in nature. HE has very dry skin and states he does not moisturize on a regular basis.   He has a callous that his surrounding his wound on the Right  which will need to be debrided.  The wound on his second toe is clean but his whole toe is red after being on antibiotics for five months.  I recommended that the patient asks the podiatrist if he feels it would be better to culture the wounds to ensure that he is on the right antibiotic.  I also recommend that the ABI be completed again to ensure proper vascularity.  The pt is going to the podiatrist tomorrow.  Mr. Mechling will benefit from skilled PT for debridemetn of his callous, and promoting a healing environment for both toes including but not limited to increasing the moisture on the wound on his great toe and decreasing the moisture on the wound on his second toe.     Wound Therapy - Functional Problem List difficulty in walking    Factors Delaying/Impairing Wound Healing Altered sensation;Diabetes Mellitus;Infection - systemic/local;Multiple medical problems;Polypharmacy;Vascular compromise   Hydrotherapy Plan Debridement;Dressing change;Patient/family education   Wound Therapy - Frequency Other (comment)  1 x a week for 6 weeks.    Wound Therapy - Current Recommendations PT   Wound Therapy - Follow Up Recommendations Other (comment)   Wound Plan Pt callous to be debrided  followed by dressing change to wounds.    Dressing  great wound to recieve honey, 2x2 followed by kling.    Dressing Second toe recieved silver alginate followed by 2" kling                          PT Education - 03/19/16 1640    Education provided Yes   Education Details change bandage only when needed stop using  betadine use neosporin    Person(s) Educated Patient   Methods Explanation   Comprehension Verbalized understanding          PT Short Term Goals - 03/19/16 0814      PT SHORT TERM GOAL #1   Title Pt to verbalize the importance of checking blood sugars and keeping blood sugars controlled for healing of his wounds.   Time 1   Period Weeks   Status New     PT  SHORT TERM GOAL #2   Title Pt to verbalize the importance of keeping his skin moisturized to assist in prevention of future wounds    Time 1   Period Weeks   Status New     PT SHORT TERM GOAL #3   Title Pt callous to be decreased 50% to decrease pressure Rt great toe wound   Time 3   Period Weeks   Status New           PT Long Term Goals - 04-08-2016 4034      PT LONG TERM GOAL #1   Title Patient to understand the importance and have scheduled an appointment with the vascular MD to determine arterial flow to his  RT LE>    Time 3   Period Weeks   Status New     PT LONG TERM GOAL #2   Title PT callous to be removed; pt to be I in dressing as wound to prepare for discharge to self care.    Time 6   Period Weeks   Status New     PT LONG TERM GOAL #3   Title Rt great toe wound to be no longer undermining to decrease risk of infection.    Time 6   Period Weeks     PT LONG TERM GOAL #4   Title Pt wound on Rt second toe to be healed to decrease risk of infection    Time 6   Period Weeks   Status New              Plan - April 08, 2016 1642    Clinical Impression Statement as above   Rehab Potential Fair   Clinical Impairments Affecting Rehab Potential PT is non compliant on blood sugars, has 12 MD    PT Frequency 1x / week   PT Duration 6 weeks   PT Treatment/Interventions ADLs/Self Care Home Management;Other (comment)  debridement and dressing change   PT Next Visit Plan continue to debride callous and assess wound changes to dressings    Consulted and Agree with Plan of Care Patient      Patient  will benefit from skilled therapeutic intervention in order to improve the following deficits and impairments:     Visit Diagnosis: Skin ulcer of toe of right foot with necrosis of muscle ([name])  Abnormality of gait      G-Codes - 04-08-16 1643    Functional Assessment Tool Used clinical judgement:  no granulation: wound has been present for 5 months, poor sugar control, vascular compromise    Functional Limitation Other PT primary   Other PT Primary Current Status (V4259) At least 60 percent but less than 80 percent impaired, limited or restricted   Other PT Primary Goal Status (D6387) At least 20 percent but less than 40 percent impaired, limited or restricted      Problem List Patient Active Problem List   Diagnosis Date Noted  . Screening examination for venereal disease 02/11/2016  . Critical lower limb ischemia 12/07/2015  . Fall 12/05/2015  . Toe ulcer, right ([place]) 09/19/2015  . Decreased pedal pulses 09/19/2015  . OSA on CPAP 09/05/2015  . Major depressive disorder, recurrent episode, moderate ([place]) 09/05/2015  . OSA (obstructive sleep apnea) 07/25/2015  . Passed out 06/28/2015  . Low back pain 06/11/2015  . Abnormality of gait 06/11/2015  . Dizziness 03/17/2015  . Weakness 02/21/2015  . Chronic renal insufficiency, stage III (moderate) 08/09/2014  . Diabetes ([place]) 11/07/2013  .  Hematuria 06/21/2013  . Hepatic steatosis 09/09/2010  . UTI 09/15/2006  . PYURIA 09/03/2006  . Human immunodeficiency virus (HIV) disease ([place]) 06/04/2006  . HERPES ZOSTER, UNCOMPLICATED 68/12/5724  . HSV 06/04/2006  . Depression 06/04/2006  . DISORDER, ATTENTION DEFICIT W/HYPERACTIVITY 06/04/2006  . THROMBOPHLEBITIS NOS 06/04/2006  . GERD 06/04/2006  . ARTHRITIS, HAND 06/04/2006  . HYPERGLYCEMIA, HX OF 06/04/2006  . HEPATITIS B, HX OF 06/04/2006    Rayetta Humphrey, PT CLT [phone] 03/20/2016, 10:06 AM  [place], [place], [address] Phone: [phone]   Fax:  [phone]  Name: HARGIS VANDYNE MRN: [id number] Date of Birth: Jul 15, 1955  PHYSICAL THERAPY DISCHARGE SUMMARY  Visits from Start of Care: 1  Current functional level related to goals / functional outcomes: As above    Remaining deficits: As above    Education / Equipment: Need for arterial study, x-ray Plan: Patient agrees to discharge.  Patient goals were not met. Patient is being discharged due to the patient's request.  ?????        Pt did not show for his appointment.  Reviewed chart and saw that he saw the podiatrist and stated that he preferred to stay  With the podiatrist rather than come to therapy for wound care.   Rayetta Humphrey, [place] CLT [phone]

## 2016-03-21 ENCOUNTER — Ambulatory Visit: Payer: BLUE CROSS/BLUE SHIELD

## 2016-03-21 ENCOUNTER — Other Ambulatory Visit: Payer: Self-pay

## 2016-03-21 NOTE — Telephone Encounter (Signed)
PA for Lyrica - form to Dr Carlota Raspberry at appt today  [facility] requests after completed on Cover my meds, they follow up with form to be completed and signed by MD  G iven to Dr. Carlota Raspberry.

## 2016-03-21 NOTE — Telephone Encounter (Signed)
Spoke with [place] approved beginning 02/28/2016 and will be approved for the lifetime of the policy

## 2016-03-22 MED ORDER — ATORVASTATIN CALCIUM 10 MG PO TABS: ORAL_TABLET | ORAL | 1 refills | Status: DC

## 2016-03-22 NOTE — Telephone Encounter (Signed)
I pulled up the key code on cover my meds to complete form, but it was cancelled, as duplicate. I called patient to see if he was able to get the Lyrica. Left message for him to call back to advise.

## 2016-03-22 NOTE — Telephone Encounter (Signed)
He has been unable to get the Lyrica, states it still needs a PA/ I told him I would let you know this needs to be done.   [employer] is the insurance carrier for the members [employer] plan, this is why [employer] did not help you by phone.  I sent you the key code via fax, unfortunately, currently I can not complete this, hope this helps.

## 2016-03-25 NOTE — Telephone Encounter (Signed)
Pa approval needed

## 2016-03-26 NOTE — Telephone Encounter (Signed)
Pharmacy claims the rx went through now paid, he will call pt when rx ready

## 2016-03-27 ENCOUNTER — Ambulatory Visit (HOSPITAL_COMMUNITY): Payer: BLUE CROSS/BLUE SHIELD | Admitting: Physical Therapy

## 2016-03-28 ENCOUNTER — Telehealth (HOSPITAL_COMMUNITY): Payer: Self-pay | Admitting: Family Medicine

## 2016-03-28 NOTE — Telephone Encounter (Signed)
03/28/16 Cindy asked me to cx all appts.  She saw a note in his chart where he told another physician that what we were doing didn't seem to be doing any better than what that dr was doing

## 2016-04-01 ENCOUNTER — Ambulatory Visit (INDEPENDENT_AMBULATORY_CARE_PROVIDER_SITE_OTHER): Payer: BLUE CROSS/BLUE SHIELD | Admitting: Sports Medicine

## 2016-04-01 ENCOUNTER — Encounter: Payer: Self-pay | Admitting: Sports Medicine

## 2016-04-01 DIAGNOSIS — L97511 Non-pressure chronic ulcer of other part of right foot limited to breakdown of skin: Secondary | ICD-10-CM | POA: Diagnosis not present

## 2016-04-01 DIAGNOSIS — R0989 Other specified symptoms and signs involving the circulatory and respiratory systems: Secondary | ICD-10-CM

## 2016-04-01 DIAGNOSIS — E1149 Type 2 diabetes mellitus with other diabetic neurological complication: Secondary | ICD-10-CM

## 2016-04-01 DIAGNOSIS — E1140 Type 2 diabetes mellitus with diabetic neuropathy, unspecified: Secondary | ICD-10-CM

## 2016-04-01 DIAGNOSIS — I739 Peripheral vascular disease, unspecified: Secondary | ICD-10-CM

## 2016-04-01 NOTE — Progress Notes (Signed)
Subjective: Daniel [name] is a 61 y.o. male patient seen in office for f/u evaluation of ulcerations of the right toes has been using Iodosorb and taking antibiotics (Augmentin). Patient has a history of diabetes and a blood glucose level uncontrolled as previously noted. Denies nausea/fever/vomiting/chills/night sweats/shortness of breath.  Patient has no other pedal complaints at this time.  Patient Active Problem List   Diagnosis Date Noted  . Screening examination for venereal disease 02/11/2016  . Critical lower limb ischemia 12/07/2015  . Fall 12/05/2015  . Toe ulcer, right ([place]) 09/19/2015  . Decreased pedal pulses 09/19/2015  . OSA on CPAP 09/05/2015  . Major depressive disorder, recurrent episode, moderate ([place]) 09/05/2015  . OSA (obstructive sleep apnea) 07/25/2015  . Passed out 06/28/2015  . Low back pain 06/11/2015  . Abnormality of gait 06/11/2015  . Dizziness 03/17/2015  . Weakness 02/21/2015  . Chronic renal insufficiency, stage III (moderate) 08/09/2014  . Diabetes ([facility]) 11/07/2013  . Hematuria 06/21/2013  . Hepatic steatosis 09/09/2010  . UTI 09/15/2006  . PYURIA 09/03/2006  . Human immunodeficiency virus (HIV) disease ([place]) 06/04/2006  . HERPES ZOSTER, UNCOMPLICATED 42/59/5638  . HSV 06/04/2006  . Depression 06/04/2006  . DISORDER, ATTENTION DEFICIT W/HYPERACTIVITY 06/04/2006  . THROMBOPHLEBITIS NOS 06/04/2006  . GERD 06/04/2006  . ARTHRITIS, HAND 06/04/2006  . HYPERGLYCEMIA, HX OF 06/04/2006  . HEPATITIS B, HX OF 06/04/2006   Current Outpatient Prescriptions on File Prior to Visit  Medication Sig Dispense Refill  . abacavir-dolutegravir-lamiVUDine (TRIUMEQ) 600-50-300 MG tablet Take 1 tablet by mouth daily. 90 tablet 1  . acetaminophen (TYLENOL) 500 MG tablet Take 1,000 mg by mouth every 8 (eight) hours as needed for moderate pain.     [name] ALPRAZolam (XANAX) 1 MG tablet Take 1 mg by mouth 3 (three) times daily as needed for anxiety.     [name]  amoxicillin-clavulanate (AUGMENTIN) 875-125 MG tablet TAKE (1) TABLET BY MOUTH TWICE DAILY. 28 tablet 0  . amphetamine-dextroamphetamine (ADDERALL) 30 MG tablet Take 30 mg by mouth 3 (three) times daily.     [name] atorvastatin (LIPITOR) 10 MG tablet TAKE ONE TABLET BY MOUTH ONCE DAILY. 90 tablet 1  . Blood Glucose Monitoring Suppl (BLOOD GLUCOSE METER KIT AND SUPPLIES) Dispense based on patient and insurance preference. Use up to four times daily as directed. (FOR ICD-9 250.00, 250.01). 1 each 0  . diclofenac sodium (VOLTAREN) 1 % GEL APPLY 2 GRAMS TO EACH KNEE IN THE MORNING AND AT BEDTIME AND APPLY 1 GRAM TO EACH KNEE IN THE AFTERNOON. 300 g 0  . divalproex (DEPAKOTE ER) 500 MG 24 hr tablet Take 1 tablet (500 mg total) by mouth at bedtime. 90 tablet 4  . glucose blood test strip Test blood sugar daily. Dx E11.9 50 each 11  . insulin aspart (NOVOLOG FLEXPEN) 100 UNIT/ML FlexPen 3 times a day (just before each meal) 06-28-08 units 15 mL 11  . Insulin Glargine (LANTUS SOLOSTAR) 100 UNIT/ML Solostar Pen Inject 65 Units into the skin every morning. 10 pen PRN  . Insulin Pen Needle (PEN NEEDLES 3/16") 31G X 5 MM MISC 1 Device by Other route 4 (four) times daily. Use to inject Lantus daily. 120 each 11  . levothyroxine (SYNTHROID, LEVOTHROID) 50 MCG tablet Take 1 tablet (50 mcg total) by mouth at bedtime. 30 tablet 11  . LYRICA 75 MG capsule TAKE ONE CAPSULE BY MOUTH THREE TIMES DAILY. 90 capsule 2  . ondansetron (ZOFRAN-ODT) 4 MG disintegrating tablet Take 2 tablets (8 mg total)  by mouth every 8 (eight) hours as needed for nausea or vomiting. 60 tablet 5  . protriptyline (VIVACTIL) 10 MG tablet   11  . ranitidine (ZANTAC) 150 MG tablet Take 150 mg by mouth daily.    . rivaroxaban (XARELTO) 15 MG TABS tablet Take 1 tablet (15 mg total) by mouth daily. 30 tablet 11  . TRINTELLIX 20 MG TABS Take 20 mg by mouth at bedtime.    [name] UNABLE TO FIND CPAP MACHINE with standard Aclaim nasal mask with humidifier. Set at 14  cwp 1 each 0  . zolpidem (AMBIEN) 10 MG tablet Take 10-20 mg by mouth at bedtime as needed for sleep.      No current facility-administered medications on file prior to visit.    Allergies  Allergen Reactions  . Aspirin Swelling  . Ibuprofen Swelling  . Sustiva [Efavirenz] Swelling and Rash    And rash.  . Nsaids Other (See Comments)    unknwn    Recent Results (from the past 2160 hour(s))  HM DIABETES EYE EXAM     Status: None   Collection Time: 01/08/16  9:25 AM  Result Value Ref Range   HM Diabetic Eye Exam  No Retinopathy  T-helper cell (CD4)- ([facility] only)     Status: Abnormal   Collection Time: 01/28/16  3:00 PM  Result Value Ref Range   CD4 T Cell Abs 800 400 - 2,700 /uL   CD4 % Helper T Cell 32 (L) 33 - 55 %    Comment: Performed at [facility]  HIV 1 RNA quant-no reflex-bld     Status: None   Collection Time: 01/28/16  4:01 PM  Result Value Ref Range   HIV 1 RNA Quant <20 <20 copies/mL    Comment:                                 NOT DETECTED   HIV1 RNA Quant, Log <1.30 <1.30 Log copies/mL    Comment:   This test was performed using the COBAS AmpliPrep/COBAS TaqMan HIV-1 test kit version 2.0. ([place].)   POCT glycosylated hemoglobin (Hb A1C)     Status: None   Collection Time: 02/04/16  2:25 PM  Result Value Ref Range   Hemoglobin A1C 8.9     Objective: There were no vitals filed for this visit.  General: Patient is awake, alert, oriented x 3 and in no acute distress.  Dermatology: Skin is warm and dry bilateral with a Partial thickness ulceration present Plantar right hallux,  measures 0.8x0.5 cm with granular base, slightly improved in appearance with less weeping since last encounter however more surrounding dry heme, ulcers that were previously at and in between other toes are healed, The ulceration does not probe to bone. There is no malodor, no active drainage, + decreased focal erythema, + focal edema. No other  acute signs of infection. No other open lesions.    Vascular: Dorsalis Pedis pulse = +4/4 on right, left 0/4,  Posterior Tibial pulse = 0/4 Bilateral,  Capillary Fill Time < 5 seconds. 1+ pitting edema and mild varicosities.  Neurologic: Protective sensation severely diminished to the level of the ankles with the 5.07/10g [employer].  Musculosketal: No Pain with palpation to ulcerated areas. No pain with compression to calves bilateral.  Assessment and Plan:  Problem List Items Addressed This Visit    None  Visit Diagnoses    Ulcer of toe of right foot, limited to breakdown of skin ([name])    -  Primary   Diminished pulses in lower extremity       PAD (peripheral artery disease) ([name])       Diabetic neuropathy with neurologic complication ([place])         -Examined patient and discussed the progression of the wounds and treatment alternatives. -Patient is awaiting vascular follow up;Dr. Chen's office is sending him to another specialist; I encouraged patient to call to follow up on this  -Excisionally debrided right hallux ulcer using tissue nipper and wound culture obtained. If negative will use Endoform to wound. Applied iodosorb and dry sterile dressing and instructed patient to continue with daily dressings at home consisting of the same or betadine covered with dry sterile dressing with care to avoid wrapping dressings to tightly around toe. Will call patient with culture results -Advised patient to go to the ER if worsens -Continue with postoperative shoe -Patient to return to office in 2 weeks for follow up ulcer care/possible Endoform wound matrix/graft and evaluation or sooner if problems arise.  Landis Martins, DPM

## 2016-04-02 ENCOUNTER — Other Ambulatory Visit: Payer: Self-pay | Admitting: Internal Medicine

## 2016-04-02 ENCOUNTER — Ambulatory Visit (HOSPITAL_COMMUNITY): Payer: BLUE CROSS/BLUE SHIELD | Admitting: Physical Therapy

## 2016-04-02 DIAGNOSIS — B20 Human immunodeficiency virus [HIV] disease: Secondary | ICD-10-CM

## 2016-04-04 LAB — WOUND CULTURE
Gram Stain: NONE SEEN
Gram Stain: NONE SEEN
Gram Stain: NONE SEEN
ORGANISM ID, BACTERIA: NO GROWTH

## 2016-04-07 ENCOUNTER — Other Ambulatory Visit: Payer: Self-pay | Admitting: Sports Medicine

## 2016-04-07 ENCOUNTER — Ambulatory Visit: Payer: BLUE CROSS/BLUE SHIELD | Admitting: Endocrinology

## 2016-04-08 ENCOUNTER — Telehealth: Payer: Self-pay | Admitting: *Deleted

## 2016-04-08 MED ORDER — AMOXICILLIN-POT CLAVULANATE 875-125 MG PO TABS: ORAL_TABLET | ORAL | 0 refills | Status: DC

## 2016-04-08 MED ORDER — AMOXICILLIN-POT CLAVULANATE 875-125 MG PO TABS
ORAL_TABLET | ORAL | 0 refills | Status: DC
Start: 2016-04-08 — End: 2016-05-27

## 2016-04-08 NOTE — Telephone Encounter (Addendum)
-----   Message from Landis Martins, [place] sent at 04/08/2016 11:45 AM EST ----- Regarding: Refill We can refill Augmentin Thanks [name]. Left message informing pt [name] had refilled the Augmentin and it was sent to the [facility] 2:29pm today.

## 2016-04-10 ENCOUNTER — Ambulatory Visit (HOSPITAL_COMMUNITY): Payer: BLUE CROSS/BLUE SHIELD | Admitting: Physical Therapy

## 2016-04-10 DIAGNOSIS — Z0271 Encounter for disability determination: Secondary | ICD-10-CM

## 2016-04-15 ENCOUNTER — Encounter: Payer: Self-pay | Admitting: Sports Medicine

## 2016-04-15 ENCOUNTER — Ambulatory Visit (INDEPENDENT_AMBULATORY_CARE_PROVIDER_SITE_OTHER): Payer: BLUE CROSS/BLUE SHIELD | Admitting: Sports Medicine

## 2016-04-15 DIAGNOSIS — E1149 Type 2 diabetes mellitus with other diabetic neurological complication: Secondary | ICD-10-CM | POA: Diagnosis not present

## 2016-04-15 DIAGNOSIS — I739 Peripheral vascular disease, unspecified: Secondary | ICD-10-CM | POA: Diagnosis not present

## 2016-04-15 DIAGNOSIS — E1140 Type 2 diabetes mellitus with diabetic neuropathy, unspecified: Secondary | ICD-10-CM

## 2016-04-15 DIAGNOSIS — L97511 Non-pressure chronic ulcer of other part of right foot limited to breakdown of skin: Secondary | ICD-10-CM | POA: Diagnosis not present

## 2016-04-15 DIAGNOSIS — R0989 Other specified symptoms and signs involving the circulatory and respiratory systems: Secondary | ICD-10-CM | POA: Diagnosis not present

## 2016-04-15 NOTE — Progress Notes (Signed)
Subjective: Daniel [name] is a 61 y.o. male patient seen in office for f/u evaluation of ulceration of the right 1st toe has been using Iodosorb and taking antibiotics (Augmentin) and is here for wound culture results and possible graft/collagen dressing. Patient has a history of diabetes and a blood glucose level uncontrolled as previously noted. Denies nausea/fever/vomiting/chills/night sweats/shortness of breath.  Patient has no other pedal complaints at this time.  Patient Active Problem List   Diagnosis Date Noted  . Screening examination for venereal disease 02/11/2016  . Critical lower limb ischemia 12/07/2015  . Fall 12/05/2015  . Toe ulcer, right ([place]) 09/19/2015  . Decreased pedal pulses 09/19/2015  . OSA on CPAP 09/05/2015  . Major depressive disorder, recurrent episode, moderate ([place]) 09/05/2015  . OSA (obstructive sleep apnea) 07/25/2015  . Passed out 06/28/2015  . Low back pain 06/11/2015  . Abnormality of gait 06/11/2015  . Dizziness 03/17/2015  . Weakness 02/21/2015  . Chronic renal insufficiency, stage III (moderate) 08/09/2014  . Diabetes ([place]) 11/07/2013  . Hematuria 06/21/2013  . Hepatic steatosis 09/09/2010  . UTI 09/15/2006  . PYURIA 09/03/2006  . Human immunodeficiency virus (HIV) disease ([place]) 06/04/2006  . HERPES ZOSTER, UNCOMPLICATED 53/64/6803  . HSV 06/04/2006  . Depression 06/04/2006  . DISORDER, ATTENTION DEFICIT W/HYPERACTIVITY 06/04/2006  . THROMBOPHLEBITIS NOS 06/04/2006  . GERD 06/04/2006  . ARTHRITIS, HAND 06/04/2006  . HYPERGLYCEMIA, HX OF 06/04/2006  . HEPATITIS B, HX OF 06/04/2006   Current Outpatient Prescriptions on File Prior to Visit  Medication Sig Dispense Refill  . acetaminophen (TYLENOL) 500 MG tablet Take 1,000 mg by mouth every 8 (eight) hours as needed for moderate pain.     [name] ALPRAZolam (XANAX) 1 MG tablet Take 1 mg by mouth 3 (three) times daily as needed for anxiety.     [name] amoxicillin-clavulanate (AUGMENTIN) 875-125 MG  tablet TAKE (1) TABLET BY MOUTH TWICE DAILY. 28 tablet 0  . amphetamine-dextroamphetamine (ADDERALL) 30 MG tablet Take 30 mg by mouth 3 (three) times daily.     [name] atorvastatin (LIPITOR) 10 MG tablet TAKE ONE TABLET BY MOUTH ONCE DAILY. 90 tablet 1  . Blood Glucose Monitoring Suppl (BLOOD GLUCOSE METER KIT AND SUPPLIES) Dispense based on patient and insurance preference. Use up to four times daily as directed. (FOR ICD-9 250.00, 250.01). 1 each 0  . diclofenac sodium (VOLTAREN) 1 % GEL APPLY 2 GRAMS TO EACH KNEE IN THE MORNING AND AT BEDTIME AND APPLY 1 GRAM TO EACH KNEE IN THE AFTERNOON. 300 g 0  . divalproex (DEPAKOTE ER) 500 MG 24 hr tablet Take 1 tablet (500 mg total) by mouth at bedtime. 90 tablet 4  . glucose blood test strip Test blood sugar daily. Dx E11.9 50 each 11  . insulin aspart (NOVOLOG FLEXPEN) 100 UNIT/ML FlexPen 3 times a day (just before each meal) 06-28-08 units 15 mL 11  . Insulin Glargine (LANTUS SOLOSTAR) 100 UNIT/ML Solostar Pen Inject 65 Units into the skin every morning. 10 pen PRN  . Insulin Pen Needle (PEN NEEDLES 3/16") 31G X 5 MM MISC 1 Device by Other route 4 (four) times daily. Use to inject Lantus daily. 120 each 11  . levothyroxine (SYNTHROID, LEVOTHROID) 50 MCG tablet Take 1 tablet (50 mcg total) by mouth at bedtime. 30 tablet 11  . LYRICA 75 MG capsule TAKE ONE CAPSULE BY MOUTH THREE TIMES DAILY. 90 capsule 2  . ondansetron (ZOFRAN-ODT) 4 MG disintegrating tablet Take 2 tablets (8 mg total) by mouth  every 8 (eight) hours as needed for nausea or vomiting. 60 tablet 5  . protriptyline (VIVACTIL) 10 MG tablet   11  . ranitidine (ZANTAC) 150 MG tablet Take 150 mg by mouth daily.    . rivaroxaban (XARELTO) 15 MG TABS tablet Take 1 tablet (15 mg total) by mouth daily. 30 tablet 11  . TRINTELLIX 20 MG TABS Take 20 mg by mouth at bedtime.    . TRIUMEQ 600-50-300 MG tablet TAKE 1 TABLET BY MOUTH DAILY 30 tablet 6  . UNABLE TO FIND CPAP MACHINE with standard Aclaim nasal mask  with humidifier. Set at 14 cwp 1 each 0  . zolpidem (AMBIEN) 10 MG tablet Take 10-20 mg by mouth at bedtime as needed for sleep.      No current facility-administered medications on file prior to visit.    Allergies  Allergen Reactions  . Aspirin Swelling  . Ibuprofen Swelling  . Sustiva [Efavirenz] Swelling and Rash    And rash.  . Nsaids Other (See Comments)    unknwn    Recent Results (from the past 2160 hour(s))  T-helper cell (CD4)- ([facility] only)     Status: Abnormal   Collection Time: 01/28/16  3:00 PM  Result Value Ref Range   CD4 T Cell Abs 800 400 - 2,700 /uL   CD4 % Helper T Cell 32 (L) 33 - 55 %    Comment: Performed at [facility]  HIV 1 RNA quant-no reflex-bld     Status: None   Collection Time: 01/28/16  4:01 PM  Result Value Ref Range   HIV 1 RNA Quant <20 <20 copies/mL    Comment:                                 NOT DETECTED   HIV1 RNA Quant, Log <1.30 <1.30 Log copies/mL    Comment:   This test was performed using the COBAS AmpliPrep/COBAS TaqMan HIV-1 test kit version 2.0. ([place].)   POCT glycosylated hemoglobin (Hb A1C)     Status: None   Collection Time: 02/04/16  2:25 PM  Result Value Ref Range   Hemoglobin A1C 8.9   WOUND CULTURE     Status: None   Collection Time: 04/01/16  1:47 PM  Result Value Ref Range   Gram Stain No WBC Seen    Gram Stain No Squamous Epithelial Cells Seen    Gram Stain No Organisms Seen    Organism ID, Bacteria NO GROWTH 2 DAYS     Objective: There were no vitals filed for this visit.  General: Patient is awake, alert, oriented x 3 and in no acute distress but with somber mood.  Dermatology: Skin is warm and dry bilateral with a Partial thickness ulceration present Plantar right hallux,  measures 1x1cm post debridement with granular base, with surrounding dry heme, ulcers that were previously at and in between other toes remains healed, The ulceration does not probe to bone.  There is no malodor, no active drainage, no erythema, decreased focal edema. No other acute signs of infection. No other open lesions.    Vascular: Dorsalis Pedis pulse = +4/4 on right, left 0/4,  Posterior Tibial pulse = 0/4 Bilateral,  Capillary Fill Time < 5 seconds. 1+ pitting edema and mild varicosities.  Neurologic: Protective sensation severely diminished to the level of the ankles with the 5.07/10g [employer].  Musculosketal: No  Pain with palpation to ulcerated area at right 1st toe. No pain with compression to calves bilateral.  Assessment and Plan:  Problem List Items Addressed This Visit    None    Visit Diagnoses    Ulcer of toe of right foot, limited to breakdown of skin ([name])    -  Primary   Diminished pulses in lower extremity       PAD (peripheral artery disease) ([name])       Diabetic neuropathy with neurologic complication ([place])         -Examined patient and discussed the progression of the wounds and treatment alternatives. -Patient is awaiting vascular follow up;Dr. Chen's office is sending him to another specialist; I encouraged patient to call to follow up on this again this visit -Excisionally debrided right hallux ulcer using tissue nipper and applied endoform dermal template ref [id number], lot [id number], exp 07/2018 secured with steristrips and covered with 2x2 and coban; Patient may change outer toe dressing 2-3x per week/ as needed  -Advised patient to go to the ER if worsens -Continue with postoperative shoe -Patient to return to office in 2 weeks for follow up ulcer care/possible reapplication of Endoform dermal template and evaluation or sooner if problems arise.  Landis Martins, DPM

## 2016-04-17 ENCOUNTER — Encounter: Payer: Self-pay | Admitting: Sports Medicine

## 2016-04-17 ENCOUNTER — Ambulatory Visit (HOSPITAL_COMMUNITY): Payer: BLUE CROSS/BLUE SHIELD | Admitting: Physical Therapy

## 2016-04-17 ENCOUNTER — Ambulatory Visit (INDEPENDENT_AMBULATORY_CARE_PROVIDER_SITE_OTHER): Payer: BLUE CROSS/BLUE SHIELD | Admitting: Sports Medicine

## 2016-04-17 DIAGNOSIS — L97511 Non-pressure chronic ulcer of other part of right foot limited to breakdown of skin: Secondary | ICD-10-CM | POA: Diagnosis not present

## 2016-04-17 DIAGNOSIS — E1140 Type 2 diabetes mellitus with diabetic neuropathy, unspecified: Secondary | ICD-10-CM | POA: Diagnosis not present

## 2016-04-17 DIAGNOSIS — R0989 Other specified symptoms and signs involving the circulatory and respiratory systems: Secondary | ICD-10-CM

## 2016-04-17 DIAGNOSIS — I739 Peripheral vascular disease, unspecified: Secondary | ICD-10-CM | POA: Diagnosis not present

## 2016-04-17 DIAGNOSIS — E1149 Type 2 diabetes mellitus with other diabetic neurological complication: Secondary | ICD-10-CM | POA: Diagnosis not present

## 2016-04-17 NOTE — Progress Notes (Signed)
Subjective: Daniel [name] is a 61 y.o. male patient seen in office for f/u evaluation of ulceration of the right 1st toe reports that the same day after the endoform dermal template was placed she went to grocery store and walked around for about 2 hours and the whole dressing came off.  Patient has no other pedal complaints at this time.  Patient Active Problem List   Diagnosis Date Noted  . Screening examination for venereal disease 02/11/2016  . Critical lower limb ischemia 12/07/2015  . Fall 12/05/2015  . Toe ulcer, right ([place]) 09/19/2015  . Decreased pedal pulses 09/19/2015  . OSA on CPAP 09/05/2015  . Major depressive disorder, recurrent episode, moderate ([place]) 09/05/2015  . OSA (obstructive sleep apnea) 07/25/2015  . Passed out 06/28/2015  . Low back pain 06/11/2015  . Abnormality of gait 06/11/2015  . Dizziness 03/17/2015  . Weakness 02/21/2015  . Chronic renal insufficiency, stage III (moderate) 08/09/2014  . Diabetes ([place]) 11/07/2013  . Hematuria 06/21/2013  . Hepatic steatosis 09/09/2010  . UTI 09/15/2006  . PYURIA 09/03/2006  . Human immunodeficiency virus (HIV) disease ([place]) 06/04/2006  . HERPES ZOSTER, UNCOMPLICATED 81/19/1478  . HSV 06/04/2006  . Depression 06/04/2006  . DISORDER, ATTENTION DEFICIT W/HYPERACTIVITY 06/04/2006  . THROMBOPHLEBITIS NOS 06/04/2006  . GERD 06/04/2006  . ARTHRITIS, HAND 06/04/2006  . HYPERGLYCEMIA, HX OF 06/04/2006  . HEPATITIS B, HX OF 06/04/2006   Current Outpatient Prescriptions on File Prior to Visit  Medication Sig Dispense Refill  . acetaminophen (TYLENOL) 500 MG tablet Take 1,000 mg by mouth every 8 (eight) hours as needed for moderate pain.     [name] ALPRAZolam (XANAX) 1 MG tablet Take 1 mg by mouth 3 (three) times daily as needed for anxiety.     [name] amoxicillin-clavulanate (AUGMENTIN) 875-125 MG tablet TAKE (1) TABLET BY MOUTH TWICE DAILY. 28 tablet 0  . amphetamine-dextroamphetamine (ADDERALL) 30 MG tablet Take 30 mg by  mouth 3 (three) times daily.     [name] atorvastatin (LIPITOR) 10 MG tablet TAKE ONE TABLET BY MOUTH ONCE DAILY. 90 tablet 1  . Blood Glucose Monitoring Suppl (BLOOD GLUCOSE METER KIT AND SUPPLIES) Dispense based on patient and insurance preference. Use up to four times daily as directed. (FOR ICD-9 250.00, 250.01). 1 each 0  . diclofenac sodium (VOLTAREN) 1 % GEL APPLY 2 GRAMS TO EACH KNEE IN THE MORNING AND AT BEDTIME AND APPLY 1 GRAM TO EACH KNEE IN THE AFTERNOON. 300 g 0  . divalproex (DEPAKOTE ER) 500 MG 24 hr tablet Take 1 tablet (500 mg total) by mouth at bedtime. 90 tablet 4  . glucose blood test strip Test blood sugar daily. Dx E11.9 50 each 11  . insulin aspart (NOVOLOG FLEXPEN) 100 UNIT/ML FlexPen 3 times a day (just before each meal) 06-28-08 units 15 mL 11  . Insulin Glargine (LANTUS SOLOSTAR) 100 UNIT/ML Solostar Pen Inject 65 Units into the skin every morning. 10 pen PRN  . Insulin Pen Needle (PEN NEEDLES 3/16") 31G X 5 MM MISC 1 Device by Other route 4 (four) times daily. Use to inject Lantus daily. 120 each 11  . levothyroxine (SYNTHROID, LEVOTHROID) 50 MCG tablet Take 1 tablet (50 mcg total) by mouth at bedtime. 30 tablet 11  . LYRICA 75 MG capsule TAKE ONE CAPSULE BY MOUTH THREE TIMES DAILY. 90 capsule 2  . ondansetron (ZOFRAN-ODT) 4 MG disintegrating tablet Take 2 tablets (8 mg total) by mouth every 8 (eight) hours as needed for nausea or  vomiting. 60 tablet 5  . protriptyline (VIVACTIL) 10 MG tablet   11  . ranitidine (ZANTAC) 150 MG tablet Take 150 mg by mouth daily.    . rivaroxaban (XARELTO) 15 MG TABS tablet Take 1 tablet (15 mg total) by mouth daily. 30 tablet 11  . TRINTELLIX 20 MG TABS Take 20 mg by mouth at bedtime.    . TRIUMEQ 600-50-300 MG tablet TAKE 1 TABLET BY MOUTH DAILY 30 tablet 6  . UNABLE TO FIND CPAP MACHINE with standard Aclaim nasal mask with humidifier. Set at 14 cwp 1 each 0  . zolpidem (AMBIEN) 10 MG tablet Take 10-20 mg by mouth at bedtime as needed for  sleep.      No current facility-administered medications on file prior to visit.    Allergies  Allergen Reactions  . Aspirin Swelling  . Ibuprofen Swelling  . Sustiva [Efavirenz] Swelling and Rash    And rash.  . Nsaids Other (See Comments)    unknwn    Recent Results (from the past 2160 hour(s))  T-helper cell (CD4)- ([facility] only)     Status: Abnormal   Collection Time: 01/28/16  3:00 PM  Result Value Ref Range   CD4 T Cell Abs 800 400 - 2,700 /uL   CD4 % Helper T Cell 32 (L) 33 - 55 %    Comment: Performed at [facility]  HIV 1 RNA quant-no reflex-bld     Status: None   Collection Time: 01/28/16  4:01 PM  Result Value Ref Range   HIV 1 RNA Quant <20 <20 copies/mL    Comment:                                 NOT DETECTED   HIV1 RNA Quant, Log <1.30 <1.30 Log copies/mL    Comment:   This test was performed using the COBAS AmpliPrep/COBAS TaqMan HIV-1 test kit version 2.0. ([place].)   POCT glycosylated hemoglobin (Hb A1C)     Status: None   Collection Time: 02/04/16  2:25 PM  Result Value Ref Range   Hemoglobin A1C 8.9   WOUND CULTURE     Status: None   Collection Time: 04/01/16  1:47 PM  Result Value Ref Range   Gram Stain No WBC Seen    Gram Stain No Squamous Epithelial Cells Seen    Gram Stain No Organisms Seen    Organism ID, Bacteria NO GROWTH 2 DAYS     Objective: There were no vitals filed for this visit.  General: Patient is awake, alert, oriented x 3 and in no acute distress but with somber mood.  Dermatology: Skin is warm and dry bilateral with a Partial thickness ulceration present Plantar right hallux,  measures 1x1cm unchanged with granular base, ulcers that were previously at and in between other toes remains healed, The ulceration does not probe to bone. There is no malodor, no active drainage, no erythema, decreased focal edema. No other acute signs of infection. No other open lesions.    Vascular: Dorsalis  Pedis pulse = +4/4 on right, left 0/4,  Posterior Tibial pulse = 0/4 Bilateral,  Capillary Fill Time < 5 seconds. 1+ pitting edema and mild varicosities.  Neurologic: Protective sensation severely diminished to the level of the ankles with the 5.07/10g [employer].  Musculosketal: No Pain with palpation to ulcerated area at right 1st toe. No pain with compression  to calves bilateral.  Assessment and Plan:  Problem List Items Addressed This Visit    None    Visit Diagnoses    Ulcer of toe of right foot, limited to breakdown of skin ([name])    -  Primary   Diminished pulses in lower extremity       PAD (peripheral artery disease) ([name])       Diabetic neuropathy with neurologic complication ([place])         -Examined patient and discussed the progression of the wounds and treatment alternatives. -Cleansed right hallux ulcer and applied Oasis tri-layer wound matrix lot [id number], using 1/3 of matrix, secured with steristrips and covered with 2x2 and coban; Patient may change outer toe dressing 2-3x per week/ as needed  -Advised patient to go to the ER if worsens -Continue with postoperative shoe and encouraged limited activity to assist with healing  -Patient to return to office in 2 weeks/as scheduled  for follow up ulcer care/possible reapplication of Endoform dermal template or oasis and evaluation or sooner if problems arise.  Landis Martins, DPM

## 2016-04-21 ENCOUNTER — Other Ambulatory Visit: Payer: Self-pay | Admitting: Family Medicine

## 2016-04-22 NOTE — Telephone Encounter (Signed)
Faxed to pharmacy

## 2016-04-22 NOTE — Telephone Encounter (Signed)
Refilled

## 2016-04-24 ENCOUNTER — Encounter (HOSPITAL_COMMUNITY): Payer: BLUE CROSS/BLUE SHIELD | Admitting: Physical Therapy

## 2016-04-25 ENCOUNTER — Other Ambulatory Visit: Payer: Self-pay | Admitting: Family Medicine

## 2016-04-25 ENCOUNTER — Ambulatory Visit (INDEPENDENT_AMBULATORY_CARE_PROVIDER_SITE_OTHER): Payer: BLUE CROSS/BLUE SHIELD | Admitting: Endocrinology

## 2016-04-25 ENCOUNTER — Encounter: Payer: Self-pay | Admitting: Endocrinology

## 2016-04-25 VITALS — BP 128/82 | HR 110 | Ht 70.5 in | Wt 250.0 lb

## 2016-04-25 DIAGNOSIS — E0849 Diabetes mellitus due to underlying condition with other diabetic neurological complication: Secondary | ICD-10-CM | POA: Diagnosis not present

## 2016-04-25 DIAGNOSIS — Z794 Long term (current) use of insulin: Secondary | ICD-10-CM | POA: Diagnosis not present

## 2016-04-25 LAB — POCT GLYCOSYLATED HEMOGLOBIN (HGB A1C): Hemoglobin A1C: 7.7

## 2016-04-25 MED ORDER — INSULIN ASPART 100 UNIT/ML FLEXPEN: PEN_INJECTOR | SUBCUTANEOUS | 11 refills | Status: DC

## 2016-04-25 NOTE — Patient Instructions (Addendum)
check your blood sugar twice a day.  vary the time of day when you check, between before the 3 meals, and at bedtime.  also check if you have symptoms of your blood sugar being too high or too low.  please keep a record of the readings and bring it to your next appointment here (or you can bring the meter itself).  You can write it on any piece of paper.  please call us sooner if your blood sugar goes below 70, or if you have a lot of readings over 200.   For now, please:  continue the lantus, 65 units each morning, and:  increase the same novolog to 3 times a day (just before each meal) 06-28-13 units.  Please come back for a follow-up appointment in 3 months.

## 2016-04-25 NOTE — Progress Notes (Signed)
Subjective:    Patient ID: Daniel [name], male    DOB: 1955/10/08, 61 y.o.   MRN: [id number]  HPI Pt returns for f/u of diabetes mellitus: DM type: Insulin-requiring type 2.  Dx'ed: 7858 Complications: polyneuropathy, PAD, renal insufficiency, and foot ulcers.  Therapy: insulin since 2016. DKA: never Severe hypoglycemia: never.  Pancreatitis: never Other: he takes multiple daily injections, but basal insulin is emphasized.   Interval history: no cbg record, but states cbg's vary from 101-468.  It is still highest at hs, than fasting, but not necessarily so.  He says the variation is due to variable meals.   Past Medical History:  Diagnosis Date  . ADHD (attention deficit hyperactivity disorder)   . Anxiety   . Chronic kidney disease   . Clotting disorder ([place])   . Depression   . Diabetes mellitus without complication ([place])   . Diabetes mellitus, type II ([place])   . Dizziness 03/17/2015  . GERD (gastroesophageal reflux disease)   . HIV disease ([place])   . HIV infection ([place])   . Liver disease   . OSA (obstructive sleep apnea) 07/25/2015   Uses CPAP regularly  . Peripheral vascular disease ([place])   . Ulcer [facility])     Past Surgical History:  Procedure Laterality Date  . SMALL INTESTINE SURGERY    . STOMACH SURGERY      Social History   Social History  . Marital status: Single    Spouse name: N/A  . Number of children: N/A  . Years of education: N/A   Occupational History  . Not on file.   Social History Main Topics  . Smoking status: Former Smoker    Packs/day: 0.10    Years: 10.00    Types: Cigars, Cigarettes    Quit date: 08/09/2014  . Smokeless tobacco: Never Used  . Alcohol use No  . Drug use: No  . Sexual activity: Not Currently    Partners: Male     Comment: pt. declined condoms   Other Topics Concern  . Not on file   Social History Narrative   Epworth Sleepiness Scale = 7 (as of 03/16/2015)    Current Outpatient Prescriptions on File Prior to Visit   Medication Sig Dispense Refill  . acetaminophen (TYLENOL) 500 MG tablet Take 1,000 mg by mouth every 8 (eight) hours as needed for moderate pain.     [name] ALPRAZolam (XANAX) 1 MG tablet Take 1 mg by mouth 3 (three) times daily as needed for anxiety.     [name] amoxicillin-clavulanate (AUGMENTIN) 875-125 MG tablet TAKE (1) TABLET BY MOUTH TWICE DAILY. 28 tablet 0  . amphetamine-dextroamphetamine (ADDERALL) 30 MG tablet Take 30 mg by mouth 3 (three) times daily.     [name] atorvastatin (LIPITOR) 10 MG tablet TAKE ONE TABLET BY MOUTH ONCE DAILY. 90 tablet 1  . Blood Glucose Monitoring Suppl (BLOOD GLUCOSE METER KIT AND SUPPLIES) Dispense based on patient and insurance preference. Use up to four times daily as directed. (FOR ICD-9 250.00, 250.01). 1 each 0  . diclofenac sodium (VOLTAREN) 1 % GEL APPLY 2 GRAMS TO EACH KNEE IN THE MORNING AND AT BEDTIME AND APPLY 1 GRAM TO EACH KNEE IN THE AFTERNOON. 300 g 0  . divalproex (DEPAKOTE ER) 500 MG 24 hr tablet Take 1 tablet (500 mg total) by mouth at bedtime. 90 tablet 4  . glucose blood test strip Test blood sugar daily. Dx E11.9 50 each 11  . Insulin Glargine (LANTUS SOLOSTAR) 100 UNIT/ML Solostar  Pen Inject 65 Units into the skin every morning. 10 pen PRN  . Insulin Pen Needle (PEN NEEDLES 3/16") 31G X 5 MM MISC 1 Device by Other route 4 (four) times daily. Use to inject Lantus daily. 120 each 11  . levothyroxine (SYNTHROID, LEVOTHROID) 50 MCG tablet Take 1 tablet (50 mcg total) by mouth at bedtime. 30 tablet 11  . LYRICA 75 MG capsule TAKE ONE CAPSULE BY MOUTH THREE TIMES DAILY. 90 capsule 0  . ondansetron (ZOFRAN-ODT) 4 MG disintegrating tablet Take 2 tablets (8 mg total) by mouth every 8 (eight) hours as needed for nausea or vomiting. 60 tablet 5  . protriptyline (VIVACTIL) 10 MG tablet   11  . ranitidine (ZANTAC) 150 MG tablet Take 150 mg by mouth daily.    . rivaroxaban (XARELTO) 15 MG TABS tablet Take 1 tablet (15 mg total) by mouth daily. 30 tablet 11  .  TRINTELLIX 20 MG TABS Take 20 mg by mouth at bedtime.    . TRIUMEQ 600-50-300 MG tablet TAKE 1 TABLET BY MOUTH DAILY 30 tablet 6  . UNABLE TO FIND CPAP MACHINE with standard Aclaim nasal mask with humidifier. Set at 14 cwp 1 each 0  . zolpidem (AMBIEN) 10 MG tablet Take 10-20 mg by mouth at bedtime as needed for sleep.      No current facility-administered medications on file prior to visit.     Allergies  Allergen Reactions  . Aspirin Swelling  . Ibuprofen Swelling  . Sustiva [Efavirenz] Swelling and Rash    And rash.  . Nsaids Other (See Comments)    unknwn    Family History  Problem Relation Age of Onset  . Cancer Brother   . Depression Brother   . COPD Mother   . Diabetes Neg Hx     BP 128/82   Pulse (!) 110   Ht 5' 10.5" (1.791 m)   Wt 250 lb (113.4 kg)   SpO2 97%   BMI 35.36 kg/m   Review of Systems He denies hypoglycemia.      Objective:   Physical Exam VITAL SIGNS:  See vs page.  GENERAL: no distress. SKIN:  no ulcer on the feet, but the skin is dry; feet are of normal color and temp.  EXTEMITIES: no deformity.  1+ left leg edema.  Right foot is still bandaged (sees wound care).  There is onychomycosis of the left toenails.  There are heavy calluses on the left foot.    PULSES: dorsalis pedis intact bilat.   NEURO: sensation is intact to touch on the feet, but decreased from normal.   A1c=7.7%     Assessment & Plan:  Insulin-requiring type 2 DM, with PAD: he needs increased rx, if it can be done with a regimen that avoids or minimizes hypoglycemia.    Patient is advised the following: Patient Instructions  check your blood sugar twice a day.  vary the time of day when you check, between before the 3 meals, and at bedtime.  also check if you have symptoms of your blood sugar being too high or too low.  please keep a record of the readings and bring it to your next appointment here (or you can bring the meter itself).  You can write it on any piece of  paper.  please call us sooner if your blood sugar goes below 70, or if you have a lot of readings over 200.   For now, please:  continue the lantus, 65 units each  morning, and:  increase the same novolog to 3 times a day (just before each meal) 06-28-13 units.  Please come back for a follow-up appointment in 3 months.

## 2016-04-26 ENCOUNTER — Ambulatory Visit: Payer: BLUE CROSS/BLUE SHIELD

## 2016-04-29 ENCOUNTER — Ambulatory Visit: Payer: BLUE CROSS/BLUE SHIELD | Admitting: Sports Medicine

## 2016-05-01 ENCOUNTER — Encounter: Payer: Self-pay | Admitting: Sports Medicine

## 2016-05-01 ENCOUNTER — Ambulatory Visit (INDEPENDENT_AMBULATORY_CARE_PROVIDER_SITE_OTHER): Payer: BLUE CROSS/BLUE SHIELD | Admitting: Sports Medicine

## 2016-05-01 DIAGNOSIS — E1149 Type 2 diabetes mellitus with other diabetic neurological complication: Secondary | ICD-10-CM

## 2016-05-01 DIAGNOSIS — R0989 Other specified symptoms and signs involving the circulatory and respiratory systems: Secondary | ICD-10-CM

## 2016-05-01 DIAGNOSIS — I739 Peripheral vascular disease, unspecified: Secondary | ICD-10-CM

## 2016-05-01 DIAGNOSIS — L97511 Non-pressure chronic ulcer of other part of right foot limited to breakdown of skin: Secondary | ICD-10-CM | POA: Diagnosis not present

## 2016-05-01 DIAGNOSIS — E1140 Type 2 diabetes mellitus with diabetic neuropathy, unspecified: Secondary | ICD-10-CM

## 2016-05-01 NOTE — Progress Notes (Signed)
Subjective: ATIBA KIMBERLIN is a 61 y.o. male patient seen in office for f/u evaluation of ulceration of the right 1st toe reports that the graft stayed in place until Sunday. Denies constitutional symptoms.  Patient has no other pedal complaints at this time.  Patient Active Problem List   Diagnosis Date Noted  . Screening examination for venereal disease 02/11/2016  . Critical lower limb ischemia 12/07/2015  . Fall 12/05/2015  . Toe ulcer, right ([place]) 09/19/2015  . Decreased pedal pulses 09/19/2015  . OSA on CPAP 09/05/2015  . Major depressive disorder, recurrent episode, moderate ([place]) 09/05/2015  . OSA (obstructive sleep apnea) 07/25/2015  . Passed out 06/28/2015  . Low back pain 06/11/2015  . Abnormality of gait 06/11/2015  . Dizziness 03/17/2015  . Weakness 02/21/2015  . Chronic renal insufficiency, stage III (moderate) 08/09/2014  . Diabetes ([place]) 11/07/2013  . Hematuria 06/21/2013  . Hepatic steatosis 09/09/2010  . UTI 09/15/2006  . PYURIA 09/03/2006  . Human immunodeficiency virus (HIV) disease ([place]) 06/04/2006  . HERPES ZOSTER, UNCOMPLICATED 24/82/5003  . HSV 06/04/2006  . Depression 06/04/2006  . DISORDER, ATTENTION DEFICIT W/HYPERACTIVITY 06/04/2006  . THROMBOPHLEBITIS NOS 06/04/2006  . GERD 06/04/2006  . ARTHRITIS, HAND 06/04/2006  . HYPERGLYCEMIA, HX OF 06/04/2006  . HEPATITIS B, HX OF 06/04/2006   Current Outpatient Prescriptions on File Prior to Visit  Medication Sig Dispense Refill  . acetaminophen (TYLENOL) 500 MG tablet Take 1,000 mg by mouth every 8 (eight) hours as needed for moderate pain.     [name] ALPRAZolam (XANAX) 1 MG tablet Take 1 mg by mouth 3 (three) times daily as needed for anxiety.     [name] amoxicillin-clavulanate (AUGMENTIN) 875-125 MG tablet TAKE (1) TABLET BY MOUTH TWICE DAILY. 28 tablet 0  . amphetamine-dextroamphetamine (ADDERALL) 30 MG tablet Take 30 mg by mouth 3 (three) times daily.     [name] atorvastatin (LIPITOR) 10 MG tablet TAKE ONE  TABLET BY MOUTH ONCE DAILY. 90 tablet 1  . Blood Glucose Monitoring Suppl (BLOOD GLUCOSE METER KIT AND SUPPLIES) Dispense based on patient and insurance preference. Use up to four times daily as directed. (FOR ICD-9 250.00, 250.01). 1 each 0  . diclofenac sodium (VOLTAREN) 1 % GEL APPLY 2 GRAMS TO EACH KNEE IN THE MORNING AND AT BEDTIME AND APPLY 1 GRAM TO EACH KNEE IN THE AFTERNOON. 300 g 0  . divalproex (DEPAKOTE ER) 500 MG 24 hr tablet Take 1 tablet (500 mg total) by mouth at bedtime. 90 tablet 4  . glucose blood test strip Test blood sugar daily. Dx E11.9 50 each 11  . insulin aspart (NOVOLOG FLEXPEN) 100 UNIT/ML FlexPen 3 times a day (just before each meal) 06-28-13 units 15 mL 11  . Insulin Glargine (LANTUS SOLOSTAR) 100 UNIT/ML Solostar Pen Inject 65 Units into the skin every morning. 10 pen PRN  . Insulin Pen Needle (PEN NEEDLES 3/16") 31G X 5 MM MISC 1 Device by Other route 4 (four) times daily. Use to inject Lantus daily. 120 each 11  . levothyroxine (SYNTHROID, LEVOTHROID) 50 MCG tablet Take 1 tablet (50 mcg total) by mouth at bedtime. 30 tablet 11  . LYRICA 75 MG capsule TAKE ONE CAPSULE BY MOUTH THREE TIMES DAILY. 90 capsule 0  . ondansetron (ZOFRAN-ODT) 4 MG disintegrating tablet Take 2 tablets (8 mg total) by mouth every 8 (eight) hours as needed for nausea or vomiting. 60 tablet 5  . protriptyline (VIVACTIL) 10 MG tablet   11  . ranitidine (ZANTAC)  150 MG tablet Take 150 mg by mouth daily.    . rivaroxaban (XARELTO) 15 MG TABS tablet Take 1 tablet (15 mg total) by mouth daily. 30 tablet 11  . TRINTELLIX 20 MG TABS Take 20 mg by mouth at bedtime.    . TRIUMEQ 600-50-300 MG tablet TAKE 1 TABLET BY MOUTH DAILY 30 tablet 6  . UNABLE TO FIND CPAP MACHINE with standard Aclaim nasal mask with humidifier. Set at 14 cwp 1 each 0  . zolpidem (AMBIEN) 10 MG tablet Take 10-20 mg by mouth at bedtime as needed for sleep.      No current facility-administered medications on file prior to visit.     Allergies  Allergen Reactions  . Aspirin Swelling  . Ibuprofen Swelling  . Sustiva [Efavirenz] Swelling and Rash    And rash.  . Nsaids Other (See Comments)    unknwn    Recent Results (from the past 2160 hour(s))  POCT glycosylated hemoglobin (Hb A1C)     Status: None   Collection Time: 02/04/16  2:25 PM  Result Value Ref Range   Hemoglobin A1C 8.9   WOUND CULTURE     Status: None   Collection Time: 04/01/16  1:47 PM  Result Value Ref Range   Gram Stain No WBC Seen    Gram Stain No Squamous Epithelial Cells Seen    Gram Stain No Organisms Seen    Organism ID, Bacteria NO GROWTH 2 DAYS   POCT glycosylated hemoglobin (Hb A1C)     Status: None   Collection Time: 04/25/16  3:41 PM  Result Value Ref Range   Hemoglobin A1C 7.7     Objective: There were no vitals filed for this visit.  General: Patient is awake, alert, oriented x 3 and in no acute distress but with somber mood.  Dermatology: Skin is warm and dry bilateral with a Partial thickness ulceration present Plantar right hallux,  measures 0.5x0.2cm with granular base, ulcers that were previously at and in between other toes remains healed, The ulceration does not probe to bone. There is no malodor, no active drainage, no erythema, decreased focal edema. No other acute signs of infection. No other open lesions.    Vascular: Dorsalis Pedis pulse = +4/4 on right, left 0/4,  Posterior Tibial pulse = 0/4 Bilateral,  Capillary Fill Time < 5 seconds. 1+ pitting edema and mild varicosities.  Neurologic: Protective sensation severely diminished to the level of the ankles with the 5.07/10g [employer].  Musculosketal: No Pain with palpation to ulcerated area at right 1st toe. No pain with compression to calves bilateral.  Assessment and Plan:  Problem List Items Addressed This Visit    None    Visit Diagnoses    Ulcer of toe of right foot, limited to breakdown of skin ([name])    -  Primary   Diminished  pulses in lower extremity       PAD (peripheral artery disease) ([name])       Diabetic neuropathy with neurologic complication ([place])         -Examined patient and discussed the progression of the wounds and treatment alternatives. -Cleansed right hallux ulcer and applied Oasis 3x7cm tri-layer wound matrix lot [id number], using 1/3 of matrix, secured with steristrips and covered with 2x2 and coban; Patient may change outer toe dressing 2-3x per week/ as needed  -Advised patient to go to the ER if worsens -Continue with postoperative shoe and encouraged limited activity to assist with healing  -Patient  to return to office in 2 weeks/as scheduled  for follow up ulcer care/possible reapplication of Endoform dermal template or oasis and evaluation or sooner if problems arise.  Landis Martins, DPM

## 2016-05-02 ENCOUNTER — Ambulatory Visit: Payer: BLUE CROSS/BLUE SHIELD | Admitting: Sports Medicine

## 2016-05-09 ENCOUNTER — Encounter: Payer: Self-pay | Admitting: Family Medicine

## 2016-05-13 ENCOUNTER — Encounter: Payer: Self-pay | Admitting: Sports Medicine

## 2016-05-13 ENCOUNTER — Ambulatory Visit (INDEPENDENT_AMBULATORY_CARE_PROVIDER_SITE_OTHER): Payer: BLUE CROSS/BLUE SHIELD | Admitting: Sports Medicine

## 2016-05-13 ENCOUNTER — Telehealth: Payer: Self-pay

## 2016-05-13 DIAGNOSIS — L97511 Non-pressure chronic ulcer of other part of right foot limited to breakdown of skin: Secondary | ICD-10-CM

## 2016-05-13 DIAGNOSIS — S90812A Abrasion, left foot, initial encounter: Secondary | ICD-10-CM

## 2016-05-13 DIAGNOSIS — I739 Peripheral vascular disease, unspecified: Secondary | ICD-10-CM

## 2016-05-13 DIAGNOSIS — R0989 Other specified symptoms and signs involving the circulatory and respiratory systems: Secondary | ICD-10-CM

## 2016-05-13 NOTE — Progress Notes (Signed)
Subjective: Daniel [name] is a 61 y.o. male patient seen in office for f/u evaluation of ulceration of the right 1st toe reports that he hasn't been changing dressing as instructed and last week was walking around barefoot and got a new spot on his left foot. Denies constitutional symptoms however admits he is thirsty and wants his blood sugar checked.  Patient has no other pedal complaints at this time.  FBS 384 checked via glucometer in office  Patient Active Problem List   Diagnosis Date Noted  . Screening examination for venereal disease 02/11/2016  . Critical lower limb ischemia 12/07/2015  . Fall 12/05/2015  . Toe ulcer, right ([place]) 09/19/2015  . Decreased pedal pulses 09/19/2015  . OSA on CPAP 09/05/2015  . Major depressive disorder, recurrent episode, moderate ([place]) 09/05/2015  . OSA (obstructive sleep apnea) 07/25/2015  . Passed out 06/28/2015  . Low back pain 06/11/2015  . Abnormality of gait 06/11/2015  . Dizziness 03/17/2015  . Weakness 02/21/2015  . Chronic renal insufficiency, stage III (moderate) 08/09/2014  . Diabetes ([place]) 11/07/2013  . Hematuria 06/21/2013  . Hepatic steatosis 09/09/2010  . UTI 09/15/2006  . PYURIA 09/03/2006  . Human immunodeficiency virus (HIV) disease ([place]) 06/04/2006  . HERPES ZOSTER, UNCOMPLICATED 38/46/6599  . HSV 06/04/2006  . Depression 06/04/2006  . DISORDER, ATTENTION DEFICIT W/HYPERACTIVITY 06/04/2006  . THROMBOPHLEBITIS NOS 06/04/2006  . GERD 06/04/2006  . ARTHRITIS, HAND 06/04/2006  . HYPERGLYCEMIA, HX OF 06/04/2006  . HEPATITIS B, HX OF 06/04/2006   Current Outpatient Prescriptions on File Prior to Visit  Medication Sig Dispense Refill  . acetaminophen (TYLENOL) 500 MG tablet Take 1,000 mg by mouth every 8 (eight) hours as needed for moderate pain.     [name] ALPRAZolam (XANAX) 1 MG tablet Take 1 mg by mouth 3 (three) times daily as needed for anxiety.     [name] amoxicillin-clavulanate (AUGMENTIN) 875-125 MG tablet TAKE (1)  TABLET BY MOUTH TWICE DAILY. 28 tablet 0  . amphetamine-dextroamphetamine (ADDERALL) 30 MG tablet Take 30 mg by mouth 3 (three) times daily.     [name] atorvastatin (LIPITOR) 10 MG tablet TAKE ONE TABLET BY MOUTH ONCE DAILY. 90 tablet 1  . Blood Glucose Monitoring Suppl (BLOOD GLUCOSE METER KIT AND SUPPLIES) Dispense based on patient and insurance preference. Use up to four times daily as directed. (FOR ICD-9 250.00, 250.01). 1 each 0  . diclofenac sodium (VOLTAREN) 1 % GEL APPLY 2 GRAMS TO EACH KNEE IN THE MORNING AND AT BEDTIME AND APPLY 1 GRAM TO EACH KNEE IN THE AFTERNOON. 300 g 0  . divalproex (DEPAKOTE ER) 500 MG 24 hr tablet Take 1 tablet (500 mg total) by mouth at bedtime. 90 tablet 4  . glucose blood test strip Test blood sugar daily. Dx E11.9 50 each 11  . insulin aspart (NOVOLOG FLEXPEN) 100 UNIT/ML FlexPen 3 times a day (just before each meal) 06-28-13 units 15 mL 11  . Insulin Glargine (LANTUS SOLOSTAR) 100 UNIT/ML Solostar Pen Inject 65 Units into the skin every morning. 10 pen PRN  . Insulin Pen Needle (PEN NEEDLES 3/16") 31G X 5 MM MISC 1 Device by Other route 4 (four) times daily. Use to inject Lantus daily. 120 each 11  . levothyroxine (SYNTHROID, LEVOTHROID) 50 MCG tablet Take 1 tablet (50 mcg total) by mouth at bedtime. 30 tablet 11  . LYRICA 75 MG capsule TAKE ONE CAPSULE BY MOUTH THREE TIMES DAILY. 90 capsule 0  . ondansetron (ZOFRAN-ODT) 4 MG disintegrating tablet  Take 2 tablets (8 mg total) by mouth every 8 (eight) hours as needed for nausea or vomiting. 60 tablet 5  . protriptyline (VIVACTIL) 10 MG tablet   11  . ranitidine (ZANTAC) 150 MG tablet Take 150 mg by mouth daily.    . rivaroxaban (XARELTO) 15 MG TABS tablet Take 1 tablet (15 mg total) by mouth daily. 30 tablet 11  . TRINTELLIX 20 MG TABS Take 20 mg by mouth at bedtime.    . TRIUMEQ 600-50-300 MG tablet TAKE 1 TABLET BY MOUTH DAILY 30 tablet 6  . UNABLE TO FIND CPAP MACHINE with standard Aclaim nasal mask with  humidifier. Set at 14 cwp 1 each 0  . zolpidem (AMBIEN) 10 MG tablet Take 10-20 mg by mouth at bedtime as needed for sleep.      No current facility-administered medications on file prior to visit.    Allergies  Allergen Reactions  . Aspirin Swelling  . Ibuprofen Swelling  . Sustiva [Efavirenz] Swelling and Rash    And rash.  . Nsaids Other (See Comments)    unknwn    Recent Results (from the past 2160 hour(s))  WOUND CULTURE     Status: None   Collection Time: 04/01/16  1:47 PM  Result Value Ref Range   Gram Stain No WBC Seen    Gram Stain No Squamous Epithelial Cells Seen    Gram Stain No Organisms Seen    Organism ID, Bacteria NO GROWTH 2 DAYS   POCT glycosylated hemoglobin (Hb A1C)     Status: None   Collection Time: 04/25/16  3:41 PM  Result Value Ref Range   Hemoglobin A1C 7.7     Objective: There were no vitals filed for this visit.  General: Patient is awake, alert, oriented x 3 and in no acute distress but with somber mood.  Dermatology: Skin is warm and dry bilateral with a Partial thickness ulceration present Plantar right hallux,  measures 1x0.5cm with granular base (larger than last visit) with maceration, ulcers that were previously at and in between other toes remains healed, The ulceration does not probe to bone. There is no malodor, no active drainage, no erythema, decreased focal edema. No other acute signs of infection. To plantar arch on left there is an abrasion that measures 0.3x0.3cm with no acute infection. No other open lesions.    Vascular: Dorsalis Pedis pulse = +4/4 on right, left 0/4,  Posterior Tibial pulse = 0/4 Bilateral,  Capillary Fill Time < 5 seconds. 1+ pitting edema and mild varicosities.  Neurologic: Protective sensation severely diminished to the level of the ankles with the 5.07/10g [employer].  Musculosketal: No Pain with palpation to ulcerated area at right 1st toe or abrasion to left arch. No pain with compression  to calves bilateral.  Assessment and Plan:  Problem List Items Addressed This Visit    None    Visit Diagnoses    Ulcer of toe of right foot, limited to breakdown of skin ([name])    -  Primary   Diminished pulses in lower extremity       PAD (peripheral artery disease) ([name])       Abrasion, left foot, initial encounter       arch     -Examined patient and discussed the progression of the wounds and treatment alternatives. -Excisionally debrided ulcer at right hallux to healthy bleeding, Cleansed and applied iodosorb. Advised patient to do the same to macerated areas and to change dressing daily -Applied  antibiotic cream to left arch abrasion and advised patient to do the same at home daily -Advised patient follow up with PCP for more recs on controlling blood sugars   -Advised patient to go to the ER if worsens -Continue with postoperative shoe and encouraged limited activity to assist with healing  -Patient to return to office in 2 weeks/as scheduled  for follow up ulcer care/possible reapplication of endoform or oasis if maceration is resolved on right 1st toe or sooner if problems arise.  Landis Martins, DPM

## 2016-05-13 NOTE — Telephone Encounter (Signed)
please call patient: Please continue the same insulin for now, as we need more info about how cbg's are doing. Please call us next week, to tell us how the blood sugar is doing

## 2016-05-13 NOTE — Telephone Encounter (Signed)
Patient came by the office today and stated he went to his podiatrist and his blood sugar was over 300. Patient stated he had forgotten to take his insulin and he would not be home until 9 pm. Patient wanted to know if we could give him an injection of insulin. I advised the patient did not have the insulin to give at this time and per [name] to resume his insulin when he got home. Patient denied nausea, vomiting and sob. Patient did state his blood sugar have been fluctuating but could not give specific numbers. Patient wanted to know if he could adjust his novolog depending on what he eats instead of 07-03-13 as prescribed? Please advise, Thanks!

## 2016-05-14 NOTE — Telephone Encounter (Signed)
Patient notified via mychart per his request.

## 2016-05-21 ENCOUNTER — Other Ambulatory Visit: Payer: Self-pay | Admitting: Family Medicine

## 2016-05-23 NOTE — Telephone Encounter (Signed)
Refilled meds requested. Upcoming appt noted.

## 2016-05-27 ENCOUNTER — Ambulatory Visit (INDEPENDENT_AMBULATORY_CARE_PROVIDER_SITE_OTHER): Payer: BLUE CROSS/BLUE SHIELD | Admitting: Sports Medicine

## 2016-05-27 DIAGNOSIS — L97511 Non-pressure chronic ulcer of other part of right foot limited to breakdown of skin: Secondary | ICD-10-CM | POA: Diagnosis not present

## 2016-05-27 DIAGNOSIS — R0989 Other specified symptoms and signs involving the circulatory and respiratory systems: Secondary | ICD-10-CM

## 2016-05-27 DIAGNOSIS — E1149 Type 2 diabetes mellitus with other diabetic neurological complication: Secondary | ICD-10-CM

## 2016-05-27 DIAGNOSIS — I739 Peripheral vascular disease, unspecified: Secondary | ICD-10-CM

## 2016-05-27 DIAGNOSIS — T148XXA Other injury of unspecified body region, initial encounter: Secondary | ICD-10-CM

## 2016-05-27 DIAGNOSIS — E1140 Type 2 diabetes mellitus with diabetic neuropathy, unspecified: Secondary | ICD-10-CM

## 2016-05-27 MED ORDER — AMOXICILLIN-POT CLAVULANATE 875-125 MG PO TABS: ORAL_TABLET | ORAL | 0 refills | Status: DC

## 2016-05-27 NOTE — Progress Notes (Signed)
Subjective: Daniel [name] is a 61 y.o. male patient seen in office for f/u evaluation of ulceration of the right 1st toe reports that he may have put dressing too tight on right foot. Denies constitutional symptoms however states he feels bad as usual and that he hasn't been checking his sugar because out of strips.  Patient has no other pedal complaints at this time.  Patient Active Problem List   Diagnosis Date Noted  . Screening examination for venereal disease 02/11/2016  . Critical lower limb ischemia 12/07/2015  . Fall 12/05/2015  . Toe ulcer, right ([place]) 09/19/2015  . Decreased pedal pulses 09/19/2015  . OSA on CPAP 09/05/2015  . Major depressive disorder, recurrent episode, moderate ([place]) 09/05/2015  . OSA (obstructive sleep apnea) 07/25/2015  . Passed out 06/28/2015  . Low back pain 06/11/2015  . Abnormality of gait 06/11/2015  . Dizziness 03/17/2015  . Weakness 02/21/2015  . Chronic renal insufficiency, stage III (moderate) 08/09/2014  . Diabetes ([place]) 11/07/2013  . Hematuria 06/21/2013  . Hepatic steatosis 09/09/2010  . UTI 09/15/2006  . PYURIA 09/03/2006  . Human immunodeficiency virus (HIV) disease ([place]) 06/04/2006  . HERPES ZOSTER, UNCOMPLICATED 12/18/8525  . HSV 06/04/2006  . Depression 06/04/2006  . DISORDER, ATTENTION DEFICIT W/HYPERACTIVITY 06/04/2006  . THROMBOPHLEBITIS NOS 06/04/2006  . GERD 06/04/2006  . ARTHRITIS, HAND 06/04/2006  . HYPERGLYCEMIA, HX OF 06/04/2006  . HEPATITIS B, HX OF 06/04/2006   Current Outpatient Prescriptions on File Prior to Visit  Medication Sig Dispense Refill  . acetaminophen (TYLENOL) 500 MG tablet Take 1,000 mg by mouth every 8 (eight) hours as needed for moderate pain.     [name] ALPRAZolam (XANAX) 1 MG tablet Take 1 mg by mouth 3 (three) times daily as needed for anxiety.     [name] amphetamine-dextroamphetamine (ADDERALL) 30 MG tablet Take 30 mg by mouth 3 (three) times daily.     [name] atorvastatin (LIPITOR) 10 MG tablet TAKE ONE  TABLET BY MOUTH DAILY. 90 tablet 0  . Blood Glucose Monitoring Suppl (BLOOD GLUCOSE METER KIT AND SUPPLIES) Dispense based on patient and insurance preference. Use up to four times daily as directed. (FOR ICD-9 250.00, 250.01). 1 each 0  . diclofenac sodium (VOLTAREN) 1 % GEL APPLY 2 GRAMS TO EACH KNEE IN THE MORNING AND AT BEDTIME AND APPLY 1 GRAM TO EACH KNEE IN THE AFTERNOON. 300 g 0  . divalproex (DEPAKOTE ER) 500 MG 24 hr tablet Take 1 tablet (500 mg total) by mouth at bedtime. 90 tablet 4  . glucose blood test strip Test blood sugar daily. Dx E11.9 50 each 11  . insulin aspart (NOVOLOG FLEXPEN) 100 UNIT/ML FlexPen 3 times a day (just before each meal) 06-28-13 units 15 mL 11  . Insulin Glargine (LANTUS SOLOSTAR) 100 UNIT/ML Solostar Pen Inject 65 Units into the skin every morning. 10 pen PRN  . Insulin Pen Needle (PEN NEEDLES 3/16") 31G X 5 MM MISC 1 Device by Other route 4 (four) times daily. Use to inject Lantus daily. 120 each 11  . levothyroxine (SYNTHROID, LEVOTHROID) 50 MCG tablet Take 1 tablet (50 mcg total) by mouth at bedtime. 30 tablet 11  . LYRICA 75 MG capsule TAKE ONE CAPSULE BY MOUTH THREE TIMES DAILY. 90 capsule 0  . ondansetron (ZOFRAN-ODT) 4 MG disintegrating tablet Take 2 tablets (8 mg total) by mouth every 8 (eight) hours as needed for nausea or vomiting. 60 tablet 5  . protriptyline (VIVACTIL) 10 MG tablet   11  .  ranitidine (ZANTAC) 150 MG tablet Take 150 mg by mouth daily.    . rivaroxaban (XARELTO) 15 MG TABS tablet Take 1 tablet (15 mg total) by mouth daily. 30 tablet 11  . TRINTELLIX 20 MG TABS Take 20 mg by mouth at bedtime.    . TRIUMEQ 600-50-300 MG tablet TAKE 1 TABLET BY MOUTH DAILY 30 tablet 6  . UNABLE TO FIND CPAP MACHINE with standard [employer] nasal mask with humidifier. Set at 14 cwp 1 each 0  . zolpidem (AMBIEN) 10 MG tablet Take 10-20 mg by mouth at bedtime as needed for sleep.      No current facility-administered medications on file prior to visit.     Allergies  Allergen Reactions  . Aspirin Swelling  . Ibuprofen Swelling  . Sustiva [Efavirenz] Swelling and Rash    And rash.  . Nsaids Other (See Comments)    unknwn    Recent Results (from the past 2160 hour(s))  WOUND CULTURE     Status: None   Collection Time: 04/01/16  1:47 PM  Result Value Ref Range   Gram Stain No WBC Seen    Gram Stain No Squamous Epithelial Cells Seen    Gram Stain No Organisms Seen    Organism ID, Bacteria NO GROWTH 2 DAYS   POCT glycosylated hemoglobin (Hb A1C)     Status: None   Collection Time: 04/25/16  3:41 PM  Result Value Ref Range   Hemoglobin A1C 7.7     Objective: There were no vitals filed for this visit.  General: Patient is awake, alert, oriented x 3 and in no acute distress but with somber mood.  Dermatology: Skin is warm and dry bilateral with a Partial thickness ulceration present Plantar right hallux,  measures 1x0.5cm with granular base (same as previous) with maceration, NEW BLISTERS in between other toes, The ulceration does not probe to bone. There is no malodor, no active drainage, no erythema, decreased focal edema. No other acute signs of infection. To plantar arch on left there is an abrasion that measures 0.3x0.3cm with no acute infection. No other open lesions.    Vascular: Dorsalis Pedis pulse = +4/4 on right, left 0/4,  Posterior Tibial pulse = 0/4 Bilateral,  Capillary Fill Time < 5 seconds. 1+ pitting edema and mild varicosities.  Neurologic: Protective sensation severely diminished to the level of the ankles with the 5.07/10g [employer].  Musculosketal: No Pain with palpation to ulcerated area at right 1st toe or toes or abrasion to left arch. No pain with compression to calves bilateral.  Assessment and Plan:  Problem List Items Addressed This Visit    None    Visit Diagnoses    Ulcer of toe of right foot, limited to breakdown of skin ([name])    -  Primary   Diminished pulses in lower extremity        PAD (peripheral artery disease) ([name])       Diabetic neuropathy with neurologic complication ([place])       Blister         -Examined patient and discussed the progression of the wounds and treatment alternatives. -Excisionally debrided ulcer at right hallux to healthy bleeding, Cleansed and applied iodosorb to right 1st toe and in between toes betadine. Advised patient to change dressing daily and refrain from putting dressings too tight  -Applied antibiotic cream to left arch abrasion and advised patient continue to do the same at home daily -Rx Augmentin  -Advised patient follow up  with PCP for more recs on controlling blood sugars   -Advised patient to go to the ER if worsens -Continue with postoperative shoe and encouraged limited activity to assist with healing  -Patient to return to office in 2 weeks/as scheduled  for follow up ulcer care/possible reapplication of endoform or oasis if maceration is resolved on right 1st toe or sooner if problems arise.  Landis Martins, DPM

## 2016-06-05 ENCOUNTER — Encounter: Payer: Self-pay | Admitting: Family Medicine

## 2016-06-05 ENCOUNTER — Ambulatory Visit (INDEPENDENT_AMBULATORY_CARE_PROVIDER_SITE_OTHER): Payer: BLUE CROSS/BLUE SHIELD | Admitting: Family Medicine

## 2016-06-05 VITALS — BP 148/89 | HR 109 | Temp 98.1°F | Resp 16 | Ht 70.0 in | Wt 247.0 lb

## 2016-06-05 DIAGNOSIS — Z8719 Personal history of other diseases of the digestive system: Secondary | ICD-10-CM

## 2016-06-05 DIAGNOSIS — E1159 Type 2 diabetes mellitus with other circulatory complications: Secondary | ICD-10-CM

## 2016-06-05 DIAGNOSIS — Z7189 Other specified counseling: Secondary | ICD-10-CM

## 2016-06-05 DIAGNOSIS — N183 Chronic kidney disease, stage 3 unspecified: Secondary | ICD-10-CM

## 2016-06-05 DIAGNOSIS — E785 Hyperlipidemia, unspecified: Secondary | ICD-10-CM

## 2016-06-05 DIAGNOSIS — Z794 Long term (current) use of insulin: Secondary | ICD-10-CM

## 2016-06-05 DIAGNOSIS — I10 Essential (primary) hypertension: Secondary | ICD-10-CM

## 2016-06-05 NOTE — Patient Instructions (Addendum)
Schedule appointment with Dr. Oletta Lamas to discuss options for colon cancer screening options. If referral needed to Dr. Oletta Lamas, let me know by My Chart and I can order that without visit.   I will check a test for protein in the urine, but that needs to be followed by endocrinology.   I will check your liver tests and kidney test on bloodwork today.   I would discuss your recent insulin changes and concerns with [name]. I think it is reasonable to send him a message with your concerns about the elevated readings and why you made the changes to see if he would recommend other changes. Your A1c was not that high (7.7) when you saw him so that does not indicate sugars that are remaining in 300-400 range.   Although blood pressure elevated slightly here today, other readings at outside office are ok. I do not recommend new medication today. Monitor it at home and bring a copy of those readings to nephrologist.    How to Take Your Blood Pressure Blood pressure is a measurement of how strongly your blood is pressing against the walls of your arteries. Arteries are blood vessels that carry blood from your heart throughout your body. Your health care provider takes your blood pressure at each office visit. You can also take your own blood pressure at home with a blood pressure machine. You may need to take your own blood pressure:  To confirm a diagnosis of high blood pressure (hypertension).  To monitor your blood pressure over time.  To make sure your blood pressure medicine is working. Supplies needed: To take your blood pressure, you will need a blood pressure machine. You can buy a blood pressure machine, or blood pressure monitor, at most drugstores or online. There are several types of home blood pressure monitors. When choosing one, consider the following:  Choose a monitor that has an arm cuff.  Choose a monitor that wraps snugly around your upper arm. You should be able to fit only  one finger between your arm and the cuff.  Do not choose a monitor that measures your blood pressure from your wrist or finger. Your health care provider can suggest a reliable monitor that will meet your needs. How to prepare To get the most accurate reading, avoid the following for 30 minutes before you check your blood pressure:  Drinking caffeine.  Drinking alcohol.  Eating.  Smoking.  Exercising. Five minutes before you check your blood pressure:  Empty your bladder.  Sit quietly without talking in a dining chair, rather than in a soft couch or armchair. How to take your blood pressure To check your blood pressure, follow the instructions in the manual that came with your blood pressure monitor. If you have a digital blood pressure monitor, the instructions may be as follows: 1. Sit up straight. 2. Place your feet on the floor. Do not cross your ankles or legs. 3. Rest your left arm at the level of your heart on a table or desk or on the arm of a chair. 4. Pull up your shirt sleeve. 5. Wrap the blood pressure cuff around the upper part of your left arm, 1 inch (2.5 cm) above your elbow. It is best to wrap the cuff around bare skin. 6. Fit the cuff snugly around your arm. You should be able to place only one finger between the cuff and your arm. 7. Position the cord inside the groove of your elbow. 8. Press the power  button. 9. Sit quietly while the cuff inflates and deflates. 10. Read the digital reading on the monitor screen and write it down (record it). 11. Wait 2-3 minutes, then repeat the steps, starting at step 1. What does my blood pressure reading mean? A blood pressure reading consists of a higher number over a lower number. Ideally, your blood pressure should be below 120/80. The first ("top") number is called the systolic pressure. It is a measure of the pressure in your arteries as your heart beats. The second ("bottom") number is called the diastolic pressure. It  is a measure of the pressure in your arteries as the heart relaxes. Blood pressure is classified into four stages. The following are the stages for adults who do not have a short-term serious illness or a chronic condition. Systolic pressure and diastolic pressure are measured in a unit called mm Hg. Normal   Systolic pressure: below 914.  Diastolic pressure: below 80. Elevated   Systolic pressure: 782-956.  Diastolic pressure: below 80. Hypertension stage 1   Systolic pressure: 213-086.  Diastolic pressure: 57-84. Hypertension stage 2   Systolic pressure: 696 or above.  Diastolic pressure: 90 or above. You can have prehypertension or hypertension even if only the systolic or only the diastolic number in your reading is higher than normal. Follow these instructions at home:  Check your blood pressure as often as recommended by your health care provider.  Take your monitor to the next appointment with your health care provider to make sure:  That you are using it correctly.  That it provides accurate readings.  Be sure you understand what your goal blood pressure numbers are.  Tell your health care provider if you are having any side effects from blood pressure medicine. Contact a health care provider if:  Your blood pressure is consistently high. Get help right away if:  Your systolic blood pressure is higher than 180.  Your diastolic blood pressure is higher than 110. This information is not intended to replace advice given to you by your health care provider. Make sure you discuss any questions you have with your health care provider. Document Released: 07/20/2015 Document Revised: 10/02/2015 Document Reviewed: 07/20/2015 Elsevier Interactive Patient Education  2017 [employer].        IF you received an x-ray today, you will receive an invoice from [facility]. Please contact [facility] at [phone] with questions or concerns regarding  your invoice.   IF you received labwork today, you will receive an invoice from [facility]. Please contact [facility] at [phone] with questions or concerns regarding your invoice.   Our billing staff will not be able to assist you with questions regarding bills from these companies.  You will be contacted with the lab results as soon as they are available. The fastest way to get your results is to activate your My Chart account. Instructions are located on the last page of this paperwork. If you have not heard from [name] regarding the results in 2 weeks, please contact this office.
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
## 2016-06-06 LAB — COMPREHENSIVE METABOLIC PANEL
A/G RATIO: 1.4 (ref 1.2–2.2)
ALT: 28 IU/L (ref 0–44)
AST: 19 IU/L (ref 0–40)
Albumin: 4.2 g/dL (ref 3.6–4.8)
Alkaline Phosphatase: 122 IU/L — ABNORMAL HIGH (ref 39–117)
BUN/Creatinine Ratio: 14 (ref 10–24)
BUN: 24 mg/dL (ref 8–27)
Bilirubin Total: 0.6 mg/dL (ref 0.0–1.2)
CALCIUM: 9.4 mg/dL (ref 8.6–10.2)
CO2: 25 mmol/L (ref 18–29)
CREATININE: 1.67 mg/dL — AB (ref 0.76–1.27)
Chloride: 102 mmol/L (ref 96–106)
GFR calc Af Amer: 50 mL/min/{1.73_m2} — ABNORMAL LOW (ref >59)
GFR, EST NON AFRICAN AMERICAN: 43 mL/min/{1.73_m2} — AB (ref >59)
Globulin, Total: 2.9 g/dL (ref 1.5–4.5)
Glucose: 190 mg/dL — ABNORMAL HIGH (ref 65–99)
POTASSIUM: 4.4 mmol/L (ref 3.5–5.2)
Sodium: 142 mmol/L (ref 134–144)
Total Protein: 7.1 g/dL (ref 6.0–8.5)

## 2016-06-06 LAB — LIPID PANEL
CHOLESTEROL TOTAL: 164 mg/dL (ref 100–199)
Chol/HDL Ratio: 4.4 ratio (ref 0.0–5.0)
HDL: 37 mg/dL — ABNORMAL LOW (ref >39)
LDL CALC: 56 mg/dL (ref 0–99)
TRIGLYCERIDES: 356 mg/dL — AB (ref 0–149)
VLDL Cholesterol Cal: 71 mg/dL — ABNORMAL HIGH (ref 5–40)

## 2016-06-06 LAB — MICROALBUMIN, URINE: Microalbumin, Urine: 92.2 ug/mL

## 2016-06-10 ENCOUNTER — Ambulatory Visit: Payer: BLUE CROSS/BLUE SHIELD | Admitting: Sports Medicine

## 2016-06-16 ENCOUNTER — Other Ambulatory Visit: Payer: Self-pay | Admitting: Family Medicine

## 2016-06-16 NOTE — Telephone Encounter (Signed)
lyrica refilled with refills, and diclofenac also sent in.

## 2016-06-24 ENCOUNTER — Encounter: Payer: Self-pay | Admitting: Sports Medicine

## 2016-06-24 ENCOUNTER — Ambulatory Visit (INDEPENDENT_AMBULATORY_CARE_PROVIDER_SITE_OTHER): Payer: BLUE CROSS/BLUE SHIELD | Admitting: Sports Medicine

## 2016-06-24 DIAGNOSIS — L97512 Non-pressure chronic ulcer of other part of right foot with fat layer exposed: Secondary | ICD-10-CM

## 2016-06-24 DIAGNOSIS — R0989 Other specified symptoms and signs involving the circulatory and respiratory systems: Secondary | ICD-10-CM

## 2016-06-24 DIAGNOSIS — E1140 Type 2 diabetes mellitus with diabetic neuropathy, unspecified: Secondary | ICD-10-CM

## 2016-06-24 DIAGNOSIS — I739 Peripheral vascular disease, unspecified: Secondary | ICD-10-CM

## 2016-06-24 DIAGNOSIS — E1149 Type 2 diabetes mellitus with other diabetic neurological complication: Secondary | ICD-10-CM

## 2016-06-24 MED ORDER — AMOXICILLIN-POT CLAVULANATE 875-125 MG PO TABS: ORAL_TABLET | ORAL | 0 refills | Status: DC

## 2016-06-24 NOTE — Progress Notes (Signed)
Subjective: Daniel [name] is a 61 y.o. male patient seen in office for f/u evaluation of ulceration of the right 1st toe. Denies constitutional symptoms however states he feels bad as usual/not motivated. FBS in office today was 129m/dl.  Patient has no other pedal complaints at this time.  Patient Active Problem List   Diagnosis Date Noted  . Screening examination for venereal disease 02/11/2016  . Critical lower limb ischemia 12/07/2015  . Fall 12/05/2015  . Toe ulcer, right ([place] 09/19/2015  . Decreased pedal pulses 09/19/2015  . OSA on CPAP 09/05/2015  . Major depressive disorder, recurrent episode, moderate ([place] 09/05/2015  . OSA (obstructive sleep apnea) 07/25/2015  . Passed out 06/28/2015  . Low back pain 06/11/2015  . Abnormality of gait 06/11/2015  . Dizziness 03/17/2015  . Weakness 02/21/2015  . Chronic renal insufficiency, stage III (moderate) 08/09/2014  . Diabetes ([facility] 11/07/2013  . Hematuria 06/21/2013  . Hepatic steatosis 09/09/2010  . UTI 09/15/2006  . PYURIA 09/03/2006  . Human immunodeficiency virus (HIV) disease ([place] 06/04/2006  . HERPES ZOSTER, UNCOMPLICATED 089/21/1941 . HSV 06/04/2006  . Depression 06/04/2006  . DISORDER, ATTENTION DEFICIT W/HYPERACTIVITY 06/04/2006  . THROMBOPHLEBITIS NOS 06/04/2006  . GERD 06/04/2006  . ARTHRITIS, HAND 06/04/2006  . HYPERGLYCEMIA, HX OF 06/04/2006  . HEPATITIS B, HX OF 06/04/2006   Current Outpatient Prescriptions on File Prior to Visit  Medication Sig Dispense Refill  . acetaminophen (TYLENOL) 500 MG tablet Take 1,000 mg by mouth every 8 (eight) hours as needed for moderate pain.     .Marland KitchenALPRAZolam (XANAX) 1 MG tablet Take 1 mg by mouth 3 (three) times daily as needed for anxiety.     .Marland Kitchenamphetamine-dextroamphetamine (ADDERALL) 30 MG tablet Take 30 mg by mouth 3 (three) times daily.     .Marland Kitchenatorvastatin (LIPITOR) 10 MG tablet TAKE ONE TABLET BY MOUTH DAILY. 90 tablet 0  . Blood Glucose Monitoring Suppl (BLOOD  GLUCOSE METER KIT AND SUPPLIES) Dispense based on patient and insurance preference. Use up to four times daily as directed. (FOR ICD-9 250.00, 250.01). 1 each 0  . diclofenac sodium (VOLTAREN) 1 % GEL APPLY 2 GRAMS TO EACH KNEE IN THE MORNING AND AT BEDTIME AND APPLY 1 GRAM TO EACH KNEE IN THE AFTERNOON. 300 g 0  . divalproex (DEPAKOTE ER) 500 MG 24 hr tablet Take 1 tablet (500 mg total) by mouth at bedtime. 90 tablet 4  . glucose blood test strip Test blood sugar daily. Dx E11.9 50 each 11  . insulin aspart (NOVOLOG FLEXPEN) 100 UNIT/ML FlexPen 3 times a day (just before each meal) 06-28-13 units 15 mL 11  . Insulin Glargine (LANTUS SOLOSTAR) 100 UNIT/ML Solostar Pen Inject 65 Units into the skin every morning. 10 pen PRN  . Insulin Pen Needle (PEN NEEDLES 3/16") 31G X 5 MM MISC 1 Device by Other route 4 (four) times daily. Use to inject Lantus daily. 120 each 11  . levothyroxine (SYNTHROID, LEVOTHROID) 50 MCG tablet Take 1 tablet (50 mcg total) by mouth at bedtime. 30 tablet 11  . LYRICA 75 MG capsule TAKE ONE CAPSULE BY MOUTH THREE TIMES DAILY. 90 capsule 5  . ondansetron (ZOFRAN-ODT) 4 MG disintegrating tablet Take 2 tablets (8 mg total) by mouth every 8 (eight) hours as needed for nausea or vomiting. 60 tablet 5  . protriptyline (VIVACTIL) 10 MG tablet   11  . ranitidine (ZANTAC) 150 MG tablet Take 150 mg by mouth daily.    .[name]  rivaroxaban (XARELTO) 15 MG TABS tablet Take 1 tablet (15 mg total) by mouth daily. 30 tablet 11  . TRINTELLIX 20 MG TABS Take 20 mg by mouth at bedtime.    . TRIUMEQ 600-50-300 MG tablet TAKE 1 TABLET BY MOUTH DAILY 30 tablet 6  . UNABLE TO FIND CPAP MACHINE with standard Aclaim nasal mask with humidifier. Set at 14 cwp 1 each 0  . zolpidem (AMBIEN) 10 MG tablet Take 10-20 mg by mouth at bedtime as needed for sleep.      No current facility-administered medications on file prior to visit.    Allergies  Allergen Reactions  . Aspirin Swelling  . Ibuprofen Swelling  .  Sustiva [Efavirenz] Swelling and Rash    And rash.  . Nsaids Other (See Comments)    unknwn    Recent Results (from the past 2160 hour(s))  WOUND CULTURE     Status: None   Collection Time: 04/01/16  1:47 PM  Result Value Ref Range   Gram Stain No WBC Seen    Gram Stain No Squamous Epithelial Cells Seen    Gram Stain No Organisms Seen    Organism ID, Bacteria NO GROWTH 2 DAYS   POCT glycosylated hemoglobin (Hb A1C)     Status: None   Collection Time: 04/25/16  3:41 PM  Result Value Ref Range   Hemoglobin A1C 7.7   Comprehensive metabolic panel     Status: Abnormal   Collection Time: 06/05/16  5:28 PM  Result Value Ref Range   Glucose 190 (H) 65 - 99 mg/dL   BUN 24 8 - 27 mg/dL   Creatinine, Ser 1.67 (H) 0.76 - 1.27 mg/dL   GFR calc non Af Amer 43 (L) >59 mL/min/1.73   GFR calc Af Amer 50 (L) >59 mL/min/1.73   BUN/Creatinine Ratio 14 10 - 24   Sodium 142 134 - 144 mmol/L   Potassium 4.4 3.5 - 5.2 mmol/L   Chloride 102 96 - 106 mmol/L   CO2 25 18 - 29 mmol/L   Calcium 9.4 8.6 - 10.2 mg/dL   Total Protein 7.1 6.0 - 8.5 g/dL   Albumin 4.2 3.6 - 4.8 g/dL   Globulin, Total 2.9 1.5 - 4.5 g/dL   Albumin/Globulin Ratio 1.4 1.2 - 2.2   Bilirubin Total 0.6 0.0 - 1.2 mg/dL   Alkaline Phosphatase 122 (H) 39 - 117 IU/L   AST 19 0 - 40 IU/L   ALT 28 0 - 44 IU/L  Microalbumin, urine     Status: None   Collection Time: 06/05/16  5:28 PM  Result Value Ref Range   Albumin, Urine 92.2 Not Estab. ug/mL  Lipid panel     Status: Abnormal   Collection Time: 06/05/16  5:28 PM  Result Value Ref Range   Cholesterol, Total 164 100 - 199 mg/dL   Triglycerides 356 (H) 0 - 149 mg/dL   HDL 37 (L) >39 mg/dL   VLDL Cholesterol Cal 71 (H) 5 - 40 mg/dL   LDL Calculated 56 0 - 99 mg/dL   Chol/HDL Ratio 4.4 0.0 - 5.0 ratio    Comment:                                   T. Chol/HDL Ratio  Men  Women                               1/2 Avg.Risk  3.4    3.3                                    Avg.Risk  5.0    4.4                                2X Avg.Risk  9.6    7.1                                3X Avg.Risk 23.4   11.0     Objective: There were no vitals filed for this visit.  General: Patient is awake, alert, oriented x 3 and in no acute distress but with somber mood.  Dermatology: Skin is warm and dry bilateral with a full thickness ulceration present Plantar right hallux,  measures 1x1x0.3cm with granular base (same as previous) with maceration, the ulceration does not probe to bone. There is no malodor, no active drainage, no erythema, decreased focal edema. No other acute signs of infection. No other open lesions.    Vascular: Dorsalis Pedis pulse = +4/4 on right, left 0/4,  Posterior Tibial pulse = 0/4 Bilateral,  Capillary Fill Time < 5 seconds. 1+ pitting edema and mild varicosities.  Neurologic: Protective sensation severely diminished to the level of the ankles with the 5.07/10g [employer].  Musculosketal: No Pain with palpation to ulcerated area at right 1st toe. No pain with compression to calves bilateral.  Assessment and Plan:  Problem List Items Addressed This Visit    None    Visit Diagnoses    Right foot ulcer, with fat layer exposed ([place])    -  Primary   PAD (peripheral artery disease) ([name])       Diminished pulses in lower extremity       Diabetic neuropathy with neurologic complication ([place])          -Examined patient and discussed the progression of the wounds and treatment alternatives. -Excisionally debrided ulcer at right hallux to healthy bleeding, Cleansed and applied iodosorb to right 1st toe; Advised patient to change dressing daily and refrain from putting dressings too tight and from self trimming  -Refilled Augmentin for preventative measures  -Advised patient follow up with PCP for more recs on controlling blood sugars   -Advised patient to go to the ER if worsens -Continue with  postoperative shoe and encouraged limited activity to assist with healing  -Patient to return to office in 2 weeks/as scheduled  for follow up ulcer care/possible theraskin graft on right 1st toe or sooner if problems arise.  Landis Martins, DPM

## 2016-06-25 ENCOUNTER — Telehealth: Payer: Self-pay | Admitting: *Deleted

## 2016-06-25 NOTE — Telephone Encounter (Addendum)
-----   Message from Landis Martins, [place] sent at 06/24/2016  5:08 PM EDT ----- Regarding: Theraskin I would like to try Theraskin on this patient.  Marcy Siren has the insurance verification form to send in  Can we check to see if this will be a good option cost wise for me to use on this patient  Dorothea Ogle sheppard rep [phone] cell.06/25/2016-Faxed required form, clinicals and demographics to [facility].06/26/2016-[employer] of [place] denied coverage of TheraSkin. 06/27/2016-I spoke with Margo Common and he will speak with his regional manager to see if there is a compromise or self-pay cost to pt. Margo Common states [employer]BS of [place] does not cover their graft and Self-pay is $1,295.00. 07/01/2016-[name] ordered EpiFix 24 mm disk. Required EpiFix form, A1C, and arterial dopplers, clinicals and demographics faxed to [facility]. 07/03/2016-T. Muncey - [facility]-EpiFix took order for 30m 1st EpiFix to be applied 07/08/2016.07/09/2016-Ordered the 2nd EpiFix 244mdisk from T. Muncey - [facility] for 07/15/2016.07/16/2016-Left message requesting a call back for an appt today to schedule to see Dr. [place] EpiFix graft, that was not applied due to pt not showing for his appt 07/15/2016. Left message for pt to contact our office schedulers to be seen in office today for application of graft. Pt is scheduled for appt today at 4:15pm.07/17/2016-Dr. StCannon Kettlerdered 3rd EpiFix for pt. I spoke with T. Muncey -[facility] and he ask that I email order and date/time. Emailed request for 3rd Epifix.07/23/2016-Emailed order for 2481mpiFix disk #4, from [facility] [place] appt 07/29/2016 at 4:00pm.07/29/2016-Pt states is running late will arrive 4:30pm. 07/30/2016-Emailed order for 5th EpiFix from T. [facility]tor appt 08/05/2016 at 3:15pm.

## 2016-06-27 ENCOUNTER — Inpatient Hospital Stay (HOSPITAL_COMMUNITY)
Admission: EM | Admit: 2016-06-27 | Discharge: 2016-06-29 | DRG: 637 | Disposition: A | Discharge status: Home / Self Care | Payer: BLUE CROSS/BLUE SHIELD | Attending: Family Medicine | Admitting: Family Medicine

## 2016-06-27 ENCOUNTER — Emergency Department (HOSPITAL_COMMUNITY): Payer: BLUE CROSS/BLUE SHIELD

## 2016-06-27 DIAGNOSIS — E1151 Type 2 diabetes mellitus with diabetic peripheral angiopathy without gangrene: Secondary | ICD-10-CM | POA: Diagnosis present

## 2016-06-27 DIAGNOSIS — L03115 Cellulitis of right lower limb: Secondary | ICD-10-CM | POA: Diagnosis present

## 2016-06-27 DIAGNOSIS — F419 Anxiety disorder, unspecified: Secondary | ICD-10-CM | POA: Diagnosis present

## 2016-06-27 DIAGNOSIS — E11621 Type 2 diabetes mellitus with foot ulcer: Secondary | ICD-10-CM | POA: Diagnosis not present

## 2016-06-27 DIAGNOSIS — E1122 Type 2 diabetes mellitus with diabetic chronic kidney disease: Secondary | ICD-10-CM | POA: Diagnosis present

## 2016-06-27 DIAGNOSIS — K219 Gastro-esophageal reflux disease without esophagitis: Secondary | ICD-10-CM | POA: Diagnosis present

## 2016-06-27 DIAGNOSIS — Z886 Allergy status to analgesic agent status: Secondary | ICD-10-CM

## 2016-06-27 DIAGNOSIS — Z9989 Dependence on other enabling machines and devices: Secondary | ICD-10-CM

## 2016-06-27 DIAGNOSIS — Z87891 Personal history of nicotine dependence: Secondary | ICD-10-CM

## 2016-06-27 DIAGNOSIS — W19XXXA Unspecified fall, initial encounter: Secondary | ICD-10-CM

## 2016-06-27 DIAGNOSIS — Z7901 Long term (current) use of anticoagulants: Secondary | ICD-10-CM

## 2016-06-27 DIAGNOSIS — Z8619 Personal history of other infectious and parasitic diseases: Secondary | ICD-10-CM

## 2016-06-27 DIAGNOSIS — E0849 Diabetes mellitus due to underlying condition with other diabetic neurological complication: Secondary | ICD-10-CM

## 2016-06-27 DIAGNOSIS — L089 Local infection of the skin and subcutaneous tissue, unspecified: Secondary | ICD-10-CM | POA: Diagnosis present

## 2016-06-27 DIAGNOSIS — Z818 Family history of other mental and behavioral disorders: Secondary | ICD-10-CM

## 2016-06-27 DIAGNOSIS — Z86718 Personal history of other venous thrombosis and embolism: Secondary | ICD-10-CM

## 2016-06-27 DIAGNOSIS — N183 Chronic kidney disease, stage 3 unspecified: Secondary | ICD-10-CM | POA: Diagnosis present

## 2016-06-27 DIAGNOSIS — G4733 Obstructive sleep apnea (adult) (pediatric): Secondary | ICD-10-CM

## 2016-06-27 DIAGNOSIS — Z21 Asymptomatic human immunodeficiency virus [HIV] infection status: Secondary | ICD-10-CM | POA: Diagnosis present

## 2016-06-27 DIAGNOSIS — Z825 Family history of asthma and other chronic lower respiratory diseases: Secondary | ICD-10-CM

## 2016-06-27 DIAGNOSIS — Z888 Allergy status to other drugs, medicaments and biological substances status: Secondary | ICD-10-CM

## 2016-06-27 DIAGNOSIS — F909 Attention-deficit hyperactivity disorder, unspecified type: Secondary | ICD-10-CM | POA: Diagnosis present

## 2016-06-27 DIAGNOSIS — E11628 Type 2 diabetes mellitus with other skin complications: Secondary | ICD-10-CM | POA: Diagnosis present

## 2016-06-27 DIAGNOSIS — B20 Human immunodeficiency virus [HIV] disease: Secondary | ICD-10-CM | POA: Diagnosis present

## 2016-06-27 DIAGNOSIS — Z66 Do not resuscitate: Secondary | ICD-10-CM | POA: Diagnosis present

## 2016-06-27 DIAGNOSIS — K769 Liver disease, unspecified: Secondary | ICD-10-CM | POA: Diagnosis present

## 2016-06-27 DIAGNOSIS — F329 Major depressive disorder, single episode, unspecified: Secondary | ICD-10-CM | POA: Diagnosis present

## 2016-06-27 DIAGNOSIS — L97519 Non-pressure chronic ulcer of other part of right foot with unspecified severity: Secondary | ICD-10-CM | POA: Diagnosis present

## 2016-06-27 DIAGNOSIS — Z809 Family history of malignant neoplasm, unspecified: Secondary | ICD-10-CM

## 2016-06-27 DIAGNOSIS — Z794 Long term (current) use of insulin: Secondary | ICD-10-CM

## 2016-06-27 MED ORDER — VANCOMYCIN HCL 10 G IV SOLR
1250.0000 mg | Freq: Once | INTRAVENOUS | Status: AC
  Administered 2016-06-28: 1250 mg via INTRAVENOUS
  Filled 2016-06-27: qty 1250

## 2016-06-27 MED ORDER — VANCOMYCIN HCL IN DEXTROSE 1-5 GM/200ML-% IV SOLN
1000.0000 mg | Freq: Once | INTRAVENOUS | Status: DC
  Administered 2016-06-28: 1000 mg via INTRAVENOUS
  Filled 2016-06-27: qty 200

## 2016-06-27 MED ORDER — PIPERACILLIN-TAZOBACTAM 3.375 G IVPB 30 MIN
3.3750 g | Freq: Once | INTRAVENOUS | Status: AC
  Administered 2016-06-28: 3.375 g via INTRAVENOUS
  Filled 2016-06-27: qty 50

## 2016-06-27 NOTE — ED Provider Notes (Signed)
[facility] DEPT Provider Note   CSN: [id number] Arrival date & time: 06/27/16  2119  By signing my name below, I, Daniel [name], attest that this documentation has been prepared under the direction and in the presence of Pollina, Gwenyth Allegra, *. Electronically Signed: Oleh [name], Scribe. 06/27/16. 11:26 PM.   History   Chief Complaint Chief Complaint  Patient presents with  . Leg Pain    Knees bilaterally  . Diabetic Ulcer    Right Great toe  . Fall    Fell at 0600 due to knees giving away. Struck head but did not lose consciousness.    HPI Daniel [name] is a 61 y.o. male with history of CKD III, HIV last CD4 32 on 01/2016, and diabetes who presents to the ED with multiple complaints. The patient states that he is having bilateral aching knee pain which is ongoing for an unclear time frame. He is also having problems with diabetic ulcers on the R foot for which he is being followed as an outpatient. He presents today because "they couldn't do anything for the foot". When asked, he also states that he "slipped" earlier today but denies any particular injuries. No focal weaknesses.  The history is provided by the patient. No language interpreter was used.  Leg Pain   This is a chronic problem. The problem occurs constantly. The problem has not changed since onset.The pain is present in the left knee and right knee. The quality of the pain is described as aching. The pain is moderate.    Past Medical History:  Diagnosis Date  . ADHD (attention deficit hyperactivity disorder)   . Anxiety   . Chronic kidney disease   . Clotting disorder ([place])   . Depression   . Diabetes mellitus without complication ([place])   . Diabetes mellitus, type II ([place])   . Dizziness 03/17/2015  . GERD (gastroesophageal reflux disease)   . HIV disease ([place])   . HIV infection ([place])   . Liver disease   . OSA (obstructive sleep apnea) 07/25/2015   Uses CPAP regularly  . Peripheral vascular  disease ([place])   . Ulcer     Patient Active Problem List   Diagnosis Date Noted  . Screening examination for venereal disease 02/11/2016  . Critical lower limb ischemia 12/07/2015  . Fall 12/05/2015  . Toe ulcer, right ([place]) 09/19/2015  . Decreased pedal pulses 09/19/2015  . OSA on CPAP 09/05/2015  . Major depressive disorder, recurrent episode, moderate ([place]) 09/05/2015  . OSA (obstructive sleep apnea) 07/25/2015  . Passed out 06/28/2015  . Low back pain 06/11/2015  . Abnormality of gait 06/11/2015  . Dizziness 03/17/2015  . Weakness 02/21/2015  . Chronic renal insufficiency, stage III (moderate) 08/09/2014  . Diabetes ([place]) 11/07/2013  . Hematuria 06/21/2013  . Hepatic steatosis 09/09/2010  . UTI 09/15/2006  . PYURIA 09/03/2006  . Human immunodeficiency virus (HIV) disease ([place]) 06/04/2006  . HERPES ZOSTER, UNCOMPLICATED 94/50/3888  . HSV 06/04/2006  . Depression 06/04/2006  . DISORDER, ATTENTION DEFICIT W/HYPERACTIVITY 06/04/2006  . THROMBOPHLEBITIS NOS 06/04/2006  . GERD 06/04/2006  . ARTHRITIS, HAND 06/04/2006  . HYPERGLYCEMIA, HX OF 06/04/2006  . HEPATITIS B, HX OF 06/04/2006    Past Surgical History:  Procedure Laterality Date  . SMALL INTESTINE SURGERY    . STOMACH SURGERY         Home Medications    Prior to Admission medications   Medication Sig Start Date End Date Taking? Authorizing Provider  acetaminophen (TYLENOL)  500 MG tablet Take 1,000 mg by mouth every 8 (eight) hours as needed for moderate pain.    Yes [provider]  ALPRAZolam Duanne Moron) 1 MG tablet Take 1 mg by mouth 3 (three) times daily as needed for anxiety.    Yes [provider]  amphetamine-dextroamphetamine (ADDERALL) 30 MG tablet Take 30 mg by mouth 3 (three) times daily.    Yes [provider]  atorvastatin (LIPITOR) 10 MG tablet TAKE ONE TABLET BY MOUTH DAILY. 05/23/16  Yes [name], MD  divalproex (DEPAKOTE ER) 500 MG 24 hr tablet Take 1 tablet (500  mg total) by mouth at bedtime. 12/05/15  Yes Marcial Pacas, MD  insulin aspart (NOVOLOG FLEXPEN) 100 UNIT/ML FlexPen 3 times a day (just before each meal) 06-28-13 units Patient taking differently: Inject 10-15 Units into the skin 3 (three) times daily with meals. 3 times a day (just before each meal) 12-03-13 units 04/25/16  Yes Renato Shin, MD  Insulin Glargine (LANTUS SOLOSTAR) 100 UNIT/ML Solostar Pen Inject 65 Units into the skin every morning. 02/04/16  Yes Renato Shin, MD  levothyroxine (SYNTHROID, LEVOTHROID) 50 MCG tablet Take 1 tablet (50 mcg total) by mouth at bedtime. 07/24/15  Yes Daub, Loura Back, MD  LYRICA 75 MG capsule TAKE ONE CAPSULE BY MOUTH THREE TIMES DAILY. 06/16/16  Yes [name], MD  ondansetron (ZOFRAN-ODT) 4 MG disintegrating tablet Take 2 tablets (8 mg total) by mouth every 8 (eight) hours as needed for nausea or vomiting. 02/11/16  Yes Comer, Okey Regal, MD  protriptyline (VIVACTIL) 10 MG tablet Take 10 mg by mouth 3 (three) times daily.  01/30/16  Yes [provider]  ranitidine (ZANTAC) 150 MG tablet Take 150 mg by mouth daily.   Yes [provider]  rivaroxaban (XARELTO) 15 MG TABS tablet Take 1 tablet (15 mg total) by mouth daily. 09/13/15  Yes Daub, Loura Back, MD  TRINTELLIX 20 MG TABS Take 20 mg by mouth at bedtime. 01/26/15  Yes [provider]  TRIUMEQ 600-50-300 MG tablet TAKE 1 TABLET BY MOUTH DAILY 04/02/16  Yes Comer, Okey Regal, MD  UNABLE TO FIND CPAP MACHINE with standard Aclaim nasal mask with humidifier. Set at 14 cwp 04/12/13  Yes Daub, Loura Back, MD  zolpidem (AMBIEN) 10 MG tablet Take 10-20 mg by mouth at bedtime as needed for sleep.    Yes [provider]  amoxicillin-clavulanate (AUGMENTIN) 875-125 MG tablet TAKE (1) TABLET BY MOUTH TWICE DAILY. 06/24/16   Landis Martins, DPM  Blood Glucose Monitoring Suppl (BLOOD GLUCOSE METER KIT AND SUPPLIES) Dispense based on patient and insurance preference. Use up to four times daily as  directed. (FOR ICD-9 250.00, 250.01). 09/08/13   [name], MD  diclofenac sodium (VOLTAREN) 1 % GEL APPLY 2 GRAMS TO EACH KNEE IN THE MORNING AND AT BEDTIME AND APPLY 1 GRAM TO EACH KNEE IN THE AFTERNOON. 06/16/16   [name], MD  glucose blood test strip Test blood sugar daily. Dx E11.9 01/10/15   Brewington, Tishira R, PA-C  Insulin Pen Needle (PEN NEEDLES 3/16") 31G X 5 MM MISC 1 Device by Other route 4 (four) times daily. Use to inject Lantus daily. 12/25/15   Renato Shin, MD    Family History Family History  Problem Relation Age of Onset  . Cancer Brother   . Depression Brother   . COPD Mother   . Diabetes Neg Hx     Social History Social History  Substance Use Topics  .  Smoking status: Former Smoker    Packs/day: 0.10    Years: 10.00    Types: Cigars, Cigarettes    Quit date: 08/09/2014  . Smokeless tobacco: Never Used  . Alcohol use No     Allergies   Aspirin; Ibuprofen; Sustiva [efavirenz]; and Nsaids   Review of Systems Review of Systems  Musculoskeletal:       Bilateral knee pain  Skin:       Skin problem per HPI  Neurological: Negative for weakness.  All other systems reviewed and are negative.    Physical Exam Updated Vital Signs BP (!) 147/87   Pulse (!) 107   Temp 99.1 F (37.3 C) (Oral)   Resp 18   Ht _0  (1.803 m)   Wt 252 lb (114.3 kg)   SpO2 96%   BMI 35.15 kg/m   Physical Exam  Constitutional: He is oriented to person, place, and time. He appears well-developed and well-nourished. No distress.  HENT:  Head: Normocephalic and atraumatic.  Right Ear: Hearing normal.  Left Ear: Hearing normal.  Nose: Nose normal.  Mouth/Throat: Oropharynx is clear and moist and mucous membranes are normal.  Eyes: Conjunctivae and EOM are normal. Pupils are equal, round, and reactive to light.  Neck: Normal range of motion. Neck supple.  Cardiovascular: Regular rhythm, S1 normal and S2 normal.  Exam reveals no gallop and no friction rub.    No murmur heard. Pulmonary/Chest: Effort normal and breath sounds normal. No respiratory distress. He exhibits no tenderness.  Abdominal: Soft. Normal appearance and bowel sounds are normal. There is no hepatosplenomegaly. There is no tenderness. There is no rebound, no guarding, no tenderness at McBurney's point and negative Murphy's sign. No hernia.  Musculoskeletal: Normal range of motion.  Neurological: He is alert and oriented to person, place, and time. He has normal strength. No cranial nerve deficit or sensory deficit. Coordination normal. GCS eye subscore is 4. GCS verbal subscore is 5. GCS motor subscore is 6.  Skin: Skin is warm, dry and intact. No rash noted. No cyanosis.  There is a 1cm ulceration to the plantar aspect of the R great toe with swelling, erythema, and warmth over the 1st and 2nd digit and distal R foot.  Psychiatric: He has a normal mood and affect. His speech is normal and behavior is normal. Thought content normal.  Nursing note and vitals reviewed.    ED Treatments / Results  Labs (all labs ordered are listed, but only abnormal results are displayed) Labs Reviewed  COMPREHENSIVE METABOLIC PANEL - Abnormal; Notable for the following:       Result Value   Sodium 134 (*)    Glucose, Bld 265 (*)    Creatinine, Ser 1.46 (*)    GFR calc non Af Amer 50 (*)    GFR calc Af Amer 58 (*)    All other components within normal limits  CBC WITH DIFFERENTIAL/PLATELET - Abnormal; Notable for the following:    MCHC 36.1 (*)    Platelets 119 (*)    All other components within normal limits  CULTURE, BLOOD (ROUTINE X 2)  CULTURE, BLOOD (ROUTINE X 2)  URINALYSIS, ROUTINE W REFLEX MICROSCOPIC  I-STAT CG4 LACTIC ACID, ED    EKG  EKG Interpretation  Date/Time:  Saturday Jun 28 2016 00:16:28 EDT Ventricular Rate:  104 PR Interval:    QRS Duration: 106 QT Interval:  336 QTC Calculation: 442 R Axis:   98 Text Interpretation:  Sinus tachycardia Right axis deviation  Otherwise within normal limits Confirmed by [facility]  MD, Silas [phone]) on 06/28/2016 12:21:15 AM       Radiology Dg Chest 2 View  Result Date: 06/28/2016 CLINICAL DATA:  Infection, pain to the right toe EXAM: CHEST  2 VIEW COMPARISON:  09/23/2015 FINDINGS: Low lung volumes. No large pleural effusion. Streaky bibasilar atelectasis. Cardiomegaly. No pneumothorax. Surgical clips at the epigastrium. IMPRESSION: Low lung volumes with streaky left greater than right bibasilar atelectasis. Mild cardiomegaly. Electronically Signed   By: Donavan Foil M.D.   On: 06/28/2016 00:26   Dg Foot Complete Right  Result Date: 06/28/2016 CLINICAL DATA:  Infection, pain to the great toe with ulcer EXAM: RIGHT FOOT COMPLETE - 3+ VIEW COMPARISON:  09/12/2015 FINDINGS: No fracture or malalignment. Mild osteopenia. Ulcer along the plantar surface of the first toe. Dorsal soft tissue swelling. IMPRESSION: Ulcer along the plantar surface of first toe. No definite underlying acute bony abnormalities. Electronically Signed   By: Donavan Foil M.D.   On: 06/28/2016 00:28    Procedures Procedures (including critical care time)  Medications Ordered in ED Medications  vancomycin (VANCOCIN) IVPB 1000 mg/200 mL premix (not administered)  vancomycin (VANCOCIN) 1,250 mg in sodium chloride 0.9 % 250 mL IVPB (not administered)  piperacillin-tazobactam (ZOSYN) IVPB 3.375 g (3.375 g Intravenous New Bag/Given 06/28/16 0114)     Initial Impression / Assessment and Plan / ED Course  I have reviewed the triage vital signs and the nursing notes.  Pertinent labs & imaging results that were available during my care of the patient were reviewed by me and considered in my medical decision making (see chart for details).     Patient presents with multiple complaints. Patient reports that he has been experiencing bilateral knee pain that has been chronic secondary to arthritis. His knees gave out earlier today causing him to fall. He  felt weak and had difficulty getting back up, but there was no injury. Patient has been ongoing treatment for diabetic foot ulcer for several weeks. Patient reports that he has been receiving oral Augmentin and also by weekly debridements. Examination today reveals significant swelling of the toe as well as other portions of the foot is concerning for spreading cellulitis. X-ray does not show gas formation or obvious osteomyelitis. Patient has failed outpatient management for this, will require hospitalization with IV antibiotics.  Final Clinical Impressions(s) / ED Diagnoses   Final diagnoses:  Cellulitis of right lower extremity  Diabetic foot infection ([place])    New Prescriptions New Prescriptions   No medications on file  I personally performed the services described in this documentation, which was scribed in my presence. The recorded information has been reviewed and is accurate.    Orpah Greek, MD 06/28/16 [phone]

## 2016-06-27 NOTE — ED Triage Notes (Signed)
Patient complains of bilateral knee pain, aching increasingly getting worse. Also co pain Rt great toe with ulcer noted on pad of great toe.

## 2016-06-28 ENCOUNTER — Observation Stay (HOSPITAL_COMMUNITY): Payer: BLUE CROSS/BLUE SHIELD

## 2016-06-28 ENCOUNTER — Encounter (HOSPITAL_COMMUNITY): Payer: Self-pay | Admitting: *Deleted

## 2016-06-28 DIAGNOSIS — Z9989 Dependence on other enabling machines and devices: Secondary | ICD-10-CM | POA: Diagnosis not present

## 2016-06-28 DIAGNOSIS — Z87891 Personal history of nicotine dependence: Secondary | ICD-10-CM | POA: Diagnosis not present

## 2016-06-28 DIAGNOSIS — E0849 Diabetes mellitus due to underlying condition with other diabetic neurological complication: Secondary | ICD-10-CM | POA: Diagnosis not present

## 2016-06-28 DIAGNOSIS — K769 Liver disease, unspecified: Secondary | ICD-10-CM | POA: Diagnosis present

## 2016-06-28 DIAGNOSIS — E11621 Type 2 diabetes mellitus with foot ulcer: Secondary | ICD-10-CM | POA: Diagnosis present

## 2016-06-28 DIAGNOSIS — L97511 Non-pressure chronic ulcer of other part of right foot limited to breakdown of skin: Secondary | ICD-10-CM | POA: Diagnosis not present

## 2016-06-28 DIAGNOSIS — L089 Local infection of the skin and subcutaneous tissue, unspecified: Secondary | ICD-10-CM

## 2016-06-28 DIAGNOSIS — E11628 Type 2 diabetes mellitus with other skin complications: Secondary | ICD-10-CM | POA: Diagnosis present

## 2016-06-28 DIAGNOSIS — G4733 Obstructive sleep apnea (adult) (pediatric): Secondary | ICD-10-CM | POA: Diagnosis present

## 2016-06-28 DIAGNOSIS — Z794 Long term (current) use of insulin: Secondary | ICD-10-CM | POA: Diagnosis not present

## 2016-06-28 DIAGNOSIS — F329 Major depressive disorder, single episode, unspecified: Secondary | ICD-10-CM | POA: Diagnosis present

## 2016-06-28 DIAGNOSIS — K219 Gastro-esophageal reflux disease without esophagitis: Secondary | ICD-10-CM | POA: Diagnosis present

## 2016-06-28 DIAGNOSIS — Z809 Family history of malignant neoplasm, unspecified: Secondary | ICD-10-CM | POA: Diagnosis not present

## 2016-06-28 DIAGNOSIS — L03115 Cellulitis of right lower limb: Secondary | ICD-10-CM

## 2016-06-28 DIAGNOSIS — N183 Chronic kidney disease, stage 3 (moderate): Secondary | ICD-10-CM

## 2016-06-28 DIAGNOSIS — L97519 Non-pressure chronic ulcer of other part of right foot with unspecified severity: Secondary | ICD-10-CM | POA: Diagnosis present

## 2016-06-28 DIAGNOSIS — F419 Anxiety disorder, unspecified: Secondary | ICD-10-CM | POA: Diagnosis present

## 2016-06-28 DIAGNOSIS — L97513 Non-pressure chronic ulcer of other part of right foot with necrosis of muscle: Secondary | ICD-10-CM

## 2016-06-28 DIAGNOSIS — B20 Human immunodeficiency virus [HIV] disease: Secondary | ICD-10-CM

## 2016-06-28 DIAGNOSIS — Z888 Allergy status to other drugs, medicaments and biological substances status: Secondary | ICD-10-CM | POA: Diagnosis not present

## 2016-06-28 DIAGNOSIS — W19XXXA Unspecified fall, initial encounter: Secondary | ICD-10-CM | POA: Diagnosis not present

## 2016-06-28 DIAGNOSIS — Z886 Allergy status to analgesic agent status: Secondary | ICD-10-CM | POA: Diagnosis not present

## 2016-06-28 DIAGNOSIS — F909 Attention-deficit hyperactivity disorder, unspecified type: Secondary | ICD-10-CM | POA: Diagnosis present

## 2016-06-28 DIAGNOSIS — E1122 Type 2 diabetes mellitus with diabetic chronic kidney disease: Secondary | ICD-10-CM | POA: Diagnosis present

## 2016-06-28 DIAGNOSIS — Z818 Family history of other mental and behavioral disorders: Secondary | ICD-10-CM | POA: Diagnosis not present

## 2016-06-28 DIAGNOSIS — Z86718 Personal history of other venous thrombosis and embolism: Secondary | ICD-10-CM | POA: Diagnosis not present

## 2016-06-28 DIAGNOSIS — E1151 Type 2 diabetes mellitus with diabetic peripheral angiopathy without gangrene: Secondary | ICD-10-CM | POA: Diagnosis present

## 2016-06-28 DIAGNOSIS — Z7901 Long term (current) use of anticoagulants: Secondary | ICD-10-CM | POA: Diagnosis not present

## 2016-06-28 DIAGNOSIS — Z8619 Personal history of other infectious and parasitic diseases: Secondary | ICD-10-CM | POA: Diagnosis not present

## 2016-06-28 DIAGNOSIS — Z825 Family history of asthma and other chronic lower respiratory diseases: Secondary | ICD-10-CM | POA: Diagnosis not present

## 2016-06-28 LAB — CBC
HEMATOCRIT: 43.8 % (ref 39.0–52.0)
Hemoglobin: 15.6 g/dL (ref 13.0–17.0)
MCH: 33.5 pg (ref 26.0–34.0)
MCHC: 35.6 g/dL (ref 30.0–36.0)
MCV: 94.2 fL (ref 78.0–100.0)
PLATELETS: 111 10*3/uL — AB (ref 150–400)
RBC: 4.65 MIL/uL (ref 4.22–5.81)
RDW: 14 % (ref 11.5–15.5)
WBC: 5.9 10*3/uL (ref 4.0–10.5)

## 2016-06-28 LAB — COMPREHENSIVE METABOLIC PANEL
ALBUMIN: 3.2 g/dL — AB (ref 3.5–5.0)
ALBUMIN: 3.6 g/dL (ref 3.5–5.0)
ALK PHOS: 107 U/L (ref 38–126)
ALT: 21 U/L (ref 17–63)
ALT: 25 U/L (ref 17–63)
ANION GAP: 7 (ref 5–15)
AST: 15 U/L (ref 15–41)
AST: 20 U/L (ref 15–41)
Alkaline Phosphatase: 95 U/L (ref 38–126)
Anion gap: 8 (ref 5–15)
BILIRUBIN TOTAL: 0.9 mg/dL (ref 0.3–1.2)
BILIRUBIN TOTAL: 1.1 mg/dL (ref 0.3–1.2)
BUN: 16 mg/dL (ref 6–20)
BUN: 17 mg/dL (ref 6–20)
CO2: 24 mmol/L (ref 22–32)
CO2: 27 mmol/L (ref 22–32)
CREATININE: 1.46 mg/dL — AB (ref 0.61–1.24)
Calcium: 8.4 mg/dL — ABNORMAL LOW (ref 8.9–10.3)
Calcium: 9.3 mg/dL (ref 8.9–10.3)
Chloride: 101 mmol/L (ref 101–111)
Chloride: 102 mmol/L (ref 101–111)
Creatinine, Ser: 1.34 mg/dL — ABNORMAL HIGH (ref 0.61–1.24)
GFR calc Af Amer: 58 mL/min — ABNORMAL LOW (ref >60)
GFR calc Af Amer: >60 mL/min (ref >60)
GFR, EST NON AFRICAN AMERICAN: 50 mL/min — AB (ref >60)
GFR, EST NON AFRICAN AMERICAN: 55 mL/min — AB (ref >60)
GLUCOSE: 265 mg/dL — AB (ref 65–99)
Glucose, Bld: 245 mg/dL — ABNORMAL HIGH (ref 65–99)
POTASSIUM: 3.5 mmol/L (ref 3.5–5.1)
POTASSIUM: 3.9 mmol/L (ref 3.5–5.1)
SODIUM: 134 mmol/L — AB (ref 135–145)
Sodium: 135 mmol/L (ref 135–145)
TOTAL PROTEIN: 6.8 g/dL (ref 6.5–8.1)
TOTAL PROTEIN: 7.5 g/dL (ref 6.5–8.1)

## 2016-06-28 LAB — URINALYSIS, ROUTINE W REFLEX MICROSCOPIC
BILIRUBIN URINE: NEGATIVE
Bacteria, UA: NONE SEEN
KETONES UR: NEGATIVE mg/dL
LEUKOCYTES UA: NEGATIVE
NITRITE: NEGATIVE
PH: 7 (ref 5.0–8.0)
Protein, ur: 30 mg/dL — AB
Specific Gravity, Urine: 1.022 (ref 1.005–1.030)

## 2016-06-28 LAB — GLUCOSE, CAPILLARY
GLUCOSE-CAPILLARY: 239 mg/dL — AB (ref 65–99)
GLUCOSE-CAPILLARY: 298 mg/dL — AB (ref 65–99)
Glucose-Capillary: 191 mg/dL — ABNORMAL HIGH (ref 65–99)

## 2016-06-28 LAB — CBC WITH DIFFERENTIAL/PLATELET
BASOS ABS: 0 10*3/uL (ref 0.0–0.1)
Basophils Relative: 0 %
EOS ABS: 0.1 10*3/uL (ref 0.0–0.7)
EOS PCT: 1 %
HCT: 45.7 % (ref 39.0–52.0)
Hemoglobin: 16.5 g/dL (ref 13.0–17.0)
LYMPHS PCT: 28 %
Lymphs Abs: 2.1 10*3/uL (ref 0.7–4.0)
MCH: 33.9 pg (ref 26.0–34.0)
MCHC: 36.1 g/dL — ABNORMAL HIGH (ref 30.0–36.0)
MCV: 93.8 fL (ref 78.0–100.0)
MONO ABS: 1 10*3/uL (ref 0.1–1.0)
Monocytes Relative: 14 %
Neutro Abs: 4.1 10*3/uL (ref 1.7–7.7)
Neutrophils Relative %: 56 %
Platelets: 119 10*3/uL — ABNORMAL LOW (ref 150–400)
RBC: 4.87 MIL/uL (ref 4.22–5.81)
RDW: 14 % (ref 11.5–15.5)
WBC: 7.4 10*3/uL (ref 4.0–10.5)

## 2016-06-28 LAB — I-STAT CG4 LACTIC ACID, ED: Lactic Acid, Venous: 1.47 mmol/L (ref 0.5–1.9)

## 2016-06-28 MED ORDER — ZOLPIDEM TARTRATE 5 MG PO TABS
ORAL_TABLET | ORAL | Status: AC
  Filled 2016-06-28: qty 2

## 2016-06-28 MED ORDER — TRAMADOL HCL 50 MG PO TABS
50.0000 mg | ORAL_TABLET | Freq: Four times a day (QID) | ORAL | Status: AC | PRN
  Administered 2016-06-28 – 2016-06-29 (×2): 50 mg via ORAL
  Filled 2016-06-28 (×2): qty 1

## 2016-06-28 MED ORDER — ONDANSETRON HCL 4 MG PO TABS
4.0000 mg | ORAL_TABLET | Freq: Four times a day (QID) | ORAL | Status: DC | PRN
  Administered 2016-06-28: 4 mg via ORAL
  Filled 2016-06-28: qty 1

## 2016-06-28 MED ORDER — ZOLPIDEM TARTRATE 5 MG PO TABS
10.0000 mg | ORAL_TABLET | Freq: Every evening | ORAL | Status: DC | PRN
Start: 2016-06-28 — End: 2016-06-29
  Administered 2016-06-28: 10 mg via ORAL
  Filled 2016-06-28: qty 2

## 2016-06-28 MED ORDER — INSULIN GLARGINE 100 UNIT/ML ~~LOC~~ SOLN
65.0000 [IU] | SUBCUTANEOUS | Status: DC
  Administered 2016-06-28 – 2016-06-29 (×2): 65 [IU] via SUBCUTANEOUS
  Filled 2016-06-28 (×3): qty 0.65

## 2016-06-28 MED ORDER — AMPHETAMINE-DEXTROAMPHETAMINE 10 MG PO TABS
30.0000 mg | ORAL_TABLET | Freq: Three times a day (TID) | ORAL | Status: DC
Start: 2016-06-28 — End: 2016-06-29
  Administered 2016-06-28 – 2016-06-29 (×4): 30 mg via ORAL
  Filled 2016-06-28 (×4): qty 3

## 2016-06-28 MED ORDER — DIVALPROEX SODIUM ER 500 MG PO TB24
500.0000 mg | ORAL_TABLET | Freq: Every day | ORAL | Status: DC
  Administered 2016-06-28: 500 mg via ORAL
  Filled 2016-06-28: qty 1

## 2016-06-28 MED ORDER — VORTIOXETINE HBR 20 MG PO TABS
20.0000 mg | ORAL_TABLET | Freq: Every day | ORAL | Status: DC
Start: 2016-06-28 — End: 2016-06-29
  Administered 2016-06-28: 20 mg via ORAL
  Filled 2016-06-28 (×2): qty 20

## 2016-06-28 MED ORDER — PROTRIPTYLINE HCL 10 MG PO TABS
10.0000 mg | ORAL_TABLET | Freq: Three times a day (TID) | ORAL | Status: DC
  Filled 2016-06-28 (×7): qty 1

## 2016-06-28 MED ORDER — LEVOTHYROXINE SODIUM 50 MCG PO TABS
50.0000 ug | ORAL_TABLET | Freq: Every day | ORAL | Status: DC
  Administered 2016-06-28: 50 ug via ORAL
  Filled 2016-06-28: qty 1

## 2016-06-28 MED ORDER — ATORVASTATIN CALCIUM 10 MG PO TABS
10.0000 mg | ORAL_TABLET | Freq: Every day | ORAL | Status: DC
  Administered 2016-06-28: 10 mg via ORAL
  Filled 2016-06-28 (×2): qty 1

## 2016-06-28 MED ORDER — LAMIVUDINE 150 MG PO TABS
300.0000 mg | ORAL_TABLET | Freq: Every day | ORAL | Status: DC
  Administered 2016-06-28: 300 mg via ORAL
  Filled 2016-06-28 (×3): qty 2

## 2016-06-28 MED ORDER — VANCOMYCIN HCL 10 G IV SOLR
1250.0000 mg | INTRAVENOUS | Status: DC
  Administered 2016-06-29: 1250 mg via INTRAVENOUS
  Filled 2016-06-28 (×2): qty 1250

## 2016-06-28 MED ORDER — DOLUTEGRAVIR SODIUM 50 MG PO TABS
50.0000 mg | ORAL_TABLET | Freq: Every day | ORAL | Status: DC
  Administered 2016-06-28: 50 mg via ORAL
  Filled 2016-06-28 (×3): qty 1

## 2016-06-28 MED ORDER — ABACAVIR SULFATE 300 MG PO TABS
600.0000 mg | ORAL_TABLET | Freq: Every day | ORAL | Status: DC
  Administered 2016-06-28: 600 mg via ORAL
  Filled 2016-06-28 (×3): qty 2

## 2016-06-28 MED ORDER — ONDANSETRON HCL 4 MG/2ML IJ SOLN: 4.0000 mg | Freq: Four times a day (QID) | INTRAMUSCULAR | Status: DC | PRN

## 2016-06-28 MED ORDER — PREGABALIN 75 MG PO CAPS
75.0000 mg | ORAL_CAPSULE | Freq: Three times a day (TID) | ORAL | Status: DC
  Administered 2016-06-28 – 2016-06-29 (×4): 75 mg via ORAL
  Filled 2016-06-28 (×4): qty 1

## 2016-06-28 MED ORDER — ALPRAZOLAM 0.5 MG PO TABS
1.0000 mg | ORAL_TABLET | Freq: Three times a day (TID) | ORAL | Status: DC | PRN
Start: 2016-06-28 — End: 2016-06-29
  Administered 2016-06-28 – 2016-06-29 (×4): 1 mg via ORAL
  Filled 2016-06-28 (×4): qty 2

## 2016-06-28 MED ORDER — RIVAROXABAN 15 MG PO TABS
15.0000 mg | ORAL_TABLET | Freq: Every day | ORAL | Status: DC
  Administered 2016-06-28: 15 mg via ORAL
  Filled 2016-06-28 (×2): qty 1

## 2016-06-28 MED ORDER — PIPERACILLIN-TAZOBACTAM 3.375 G IVPB
3.3750 g | Freq: Three times a day (TID) | INTRAVENOUS | Status: DC
  Administered 2016-06-28 – 2016-06-29 (×4): 3.375 g via INTRAVENOUS
  Filled 2016-06-28 (×4): qty 50

## 2016-06-28 MED ORDER — ACETAMINOPHEN 650 MG RE SUPP: 650.0000 mg | Freq: Four times a day (QID) | RECTAL | Status: DC | PRN

## 2016-06-28 MED ORDER — ACETAMINOPHEN 325 MG PO TABS: 650.0000 mg | ORAL_TABLET | Freq: Four times a day (QID) | ORAL | Status: DC | PRN

## 2016-06-28 MED ORDER — SODIUM CHLORIDE 0.9 % IV SOLN
INTRAVENOUS | Status: DC
  Administered 2016-06-28: 04:00:00 via INTRAVENOUS

## 2016-06-28 MED ORDER — INSULIN ASPART 100 UNIT/ML ~~LOC~~ SOLN
0.0000 [IU] | Freq: Three times a day (TID) | SUBCUTANEOUS | Status: DC
  Administered 2016-06-28: 3 [IU] via SUBCUTANEOUS
  Administered 2016-06-28: 5 [IU] via SUBCUTANEOUS
  Administered 2016-06-28: 3 [IU] via SUBCUTANEOUS
  Administered 2016-06-29 (×2): 2 [IU] via SUBCUTANEOUS

## 2016-06-28 NOTE — H&P (Addendum)
TRH H&P    Patient Demographics:    Daniel [name], is a 61 y.o. male  MRN: [id number]  DOB - Apr 04, 1955  Admit Date - 06/27/2016  Referring MD/NP/PA: Dr Betsey Holiday  Outpatient Primary MD for the patient is [name], MD  Patient coming from: Home  Chief Complaint  Patient presents with  . Leg Pain    Knees bilaterally  . Diabetic Ulcer    Right Great toe  . Fall    Fell at 0600 due to knees giving away. Struck head but did not lose consciousness.      HPI:    Daniel [name]  is a 61 y.o. male, With history of sick kidney stage III, HIV, DVT, PE on anticoagulation with Xarelto came to hospital with pain in the lower extremities both knees and feet. Patient has history of diabetic foot ulcer and is followed by PCP as outpatient. Patient says that he earlier fell at home but denies any injuries. He has been complaining of pain in the right foot also noticed increased warmth and redness. He denies nausea vomiting or diarrhea. No chest pain or shortness of breath. He denies dysuria He denies fever.  In the ED, x-ray of right foot showed ulcer at the base of right big toe, no acute bony abnormalities. Lactic acid is 1.47    Review of systems:      A full 10 point Review of Systems was done, except as stated above, all other Review of Systems were negative.   With Past History of the following :    Past Medical History:  Diagnosis Date  . ADHD (attention deficit hyperactivity disorder)   . Anxiety   . Chronic kidney disease   . Clotting disorder ([place])   . Depression   . Diabetes mellitus without complication ([place])   . Diabetes mellitus, type II ([place])   . Dizziness 03/17/2015  . GERD (gastroesophageal reflux disease)   . HIV disease ([place])   . HIV infection ([place])   . Liver disease   . OSA (obstructive sleep apnea) 07/25/2015   Uses CPAP regularly  . Peripheral vascular disease ([place])   .  Ulcer       Past Surgical History:  Procedure Laterality Date  . SMALL INTESTINE SURGERY    . STOMACH SURGERY        Social History:      Social History  Substance Use Topics  . Smoking status: Former Smoker    Packs/day: 0.10    Years: 10.00    Types: Cigars, Cigarettes    Quit date: 08/09/2014  . Smokeless tobacco: Never Used  . Alcohol use No       Family History :     Family History  Problem Relation Age of Onset  . Cancer Brother   . Depression Brother   . COPD Mother   . Diabetes Neg Hx      Home Medications:   Prior to Admission medications   Medication Sig Start Date End Date Taking? Authorizing Provider  acetaminophen (TYLENOL) 500 MG  tablet Take 1,000 mg by mouth every 8 (eight) hours as needed for moderate pain.    Yes Historical Provider, MD  ALPRAZolam Duanne Moron) 1 MG tablet Take 1 mg by mouth 3 (three) times daily as needed for anxiety.    Yes Historical Provider, MD  amphetamine-dextroamphetamine (ADDERALL) 30 MG tablet Take 30 mg by mouth 3 (three) times daily.    Yes Historical Provider, MD  atorvastatin (LIPITOR) 10 MG tablet TAKE ONE TABLET BY MOUTH DAILY. 05/23/16  Yes [name], MD  divalproex (DEPAKOTE ER) 500 MG 24 hr tablet Take 1 tablet (500 mg total) by mouth at bedtime. 12/05/15  Yes Marcial Pacas, MD  insulin aspart (NOVOLOG FLEXPEN) 100 UNIT/ML FlexPen 3 times a day (just before each meal) 06-28-13 units Patient taking differently: Inject 10-15 Units into the skin 3 (three) times daily with meals. 3 times a day (just before each meal) 12-03-13 units 04/25/16  Yes Renato Shin, MD  Insulin Glargine (LANTUS SOLOSTAR) 100 UNIT/ML Solostar Pen Inject 65 Units into the skin every morning. 02/04/16  Yes Renato Shin, MD  levothyroxine (SYNTHROID, LEVOTHROID) 50 MCG tablet Take 1 tablet (50 mcg total) by mouth at bedtime. 07/24/15  Yes [name], MD  LYRICA 75 MG capsule TAKE ONE CAPSULE BY MOUTH THREE TIMES DAILY. 06/16/16  Yes [name], MD   ondansetron (ZOFRAN-ODT) 4 MG disintegrating tablet Take 2 tablets (8 mg total) by mouth every 8 (eight) hours as needed for nausea or vomiting. 02/11/16  Yes Thayer Headings, MD  protriptyline (VIVACTIL) 10 MG tablet Take 10 mg by mouth 3 (three) times daily.  01/30/16  Yes Historical Provider, MD  ranitidine (ZANTAC) 150 MG tablet Take 150 mg by mouth daily.   Yes Historical Provider, MD  rivaroxaban (XARELTO) 15 MG TABS tablet Take 1 tablet (15 mg total) by mouth daily. 09/13/15  Yes [name], MD  TRINTELLIX 20 MG TABS Take 20 mg by mouth at bedtime. 01/26/15  Yes Historical Provider, MD  TRIUMEQ 600-50-300 MG tablet TAKE 1 TABLET BY MOUTH DAILY 04/02/16  Yes Thayer Headings, MD  UNABLE TO FIND CPAP MACHINE with standard [employer] nasal mask with humidifier. Set at 14 cwp 04/12/13  Yes [name], MD  zolpidem (AMBIEN) 10 MG tablet Take 10-20 mg by mouth at bedtime as needed for sleep.    Yes Historical Provider, MD  amoxicillin-clavulanate (AUGMENTIN) 875-125 MG tablet TAKE (1) TABLET BY MOUTH TWICE DAILY. 06/24/16   Landis Martins, DPM  Blood Glucose Monitoring Suppl (BLOOD GLUCOSE METER KIT AND SUPPLIES) Dispense based on patient and insurance preference. Use up to four times daily as directed. (FOR ICD-9 250.00, 250.01). 09/08/13   [name], MD  diclofenac sodium (VOLTAREN) 1 % GEL APPLY 2 GRAMS TO EACH KNEE IN THE MORNING AND AT BEDTIME AND APPLY 1 GRAM TO EACH KNEE IN THE AFTERNOON. 06/16/16   [name], MD  glucose blood test strip Test blood sugar daily. Dx E11.9 01/10/15   Tishira R Brewington, PA-C  Insulin Pen Needle (PEN NEEDLES 3/16") 31G X 5 MM MISC 1 Device by Other route 4 (four) times daily. Use to inject Lantus daily. 12/25/15   Renato Shin, MD     Allergies:     Allergies  Allergen Reactions  . Aspirin Swelling  . Ibuprofen Swelling  . Sustiva [Efavirenz] Swelling and Rash    And rash.  . Nsaids Other (See Comments)    unknwn     Physical  Exam:    Vitals  Blood pressure (!) 147/87, pulse (!) 107, temperature 99.1 F (37.3 C), temperature source Oral, resp. rate 18, height 5' 11"  (1.803 m), weight 114.3 kg (252 lb), SpO2 96 %.  1.  General: Appears in no acute distress  2. Psychiatric:  Intact judgement and  insight, awake alert, oriented x 3.  3. Neurologic: No focal neurological deficits, all cranial nerves intact.Strength 5/5 all 4 extremities, sensation intact all 4 extremities, plantars down going.  4. Eyes :  anicteric sclerae, moist conjunctivae with no lid lag. PERRLA.  5. ENMT:  Oropharynx clear with moist mucous membranes and good dentition  6. Neck:  supple, no cervical lymphadenopathy appriciated, No thyromegaly  7. Respiratory : Normal respiratory effort, good air movement bilaterally,clear to  auscultation bilaterally  8. Cardiovascular : RRR, no gallops, rubs or murmurs, no leg edema  9. Gastrointestinal:  Positive bowel sounds, abdomen soft, non-tender to palpation,no hepatosplenomegaly, no rigidity or guarding       10. Skin:  Marked erythema noted in the right lower extremity, extending up to the dorsum of the foot. Positive warm to touch. Clean-based ulcer noted at the plantar aspect of the right big toe  11.Musculoskeletal:  Good muscle tone,  joints appear normal , no effusions,  normal range of motion    Data Review:    CBC  Recent Labs Lab 06/28/16 0010  WBC 7.4  HGB 16.5  HCT 45.7  PLT 119*  MCV 93.8  MCH 33.9  MCHC 36.1*  RDW 14.0  LYMPHSABS 2.1  MONOABS 1.0  EOSABS 0.1  BASOSABS 0.0   ------------------------------------------------------------------------------------------------------------------  Chemistries   Recent Labs Lab 06/28/16 0010  NA 134*  K 3.9  CL 102  CO2 24  GLUCOSE 265*  BUN 17  CREATININE 1.46*  CALCIUM 9.3  AST 20  ALT 25  ALKPHOS 107  BILITOT 0.9    ------------------------------------------------------------------------------------------------------------------  ------------------------------------------------------------------------------------------------------------------ GFR: Estimated Creatinine Clearance: 67.4 mL/min (A) (by C-G formula based on SCr of 1.46 mg/dL (H)). Liver Function Tests:  Recent Labs Lab 06/28/16 0010  AST 20  ALT 25  ALKPHOS 107  BILITOT 0.9  PROT 7.5  ALBUMIN 3.6    --------------------------------------------------------------------------------------------------------------- Urine analysis:    Component Value Date/Time   COLORURINE YELLOW 11/26/2015 2109   APPEARANCEUR CLEAR 11/26/2015 2109   LABSPEC 1.035 (H) 11/26/2015 2109   PHURINE 6.5 11/26/2015 2109   GLUCOSEU >1000 (A) 11/26/2015 2109   GLUCOSEU NEG mg/dL 08/21/2006 0220   HGBUR TRACE (A) 11/26/2015 2109   HGBUR trace-intact 01/01/2007 1340   BILIRUBINUR NEGATIVE 11/26/2015 2109   BILIRUBINUR negative 07/11/2015 1413   BILIRUBINUR neg 08/09/2014 [place] 11/26/2015 2109   PROTEINUR 100 (A) 11/26/2015 2109   UROBILINOGEN 1.0 07/11/2015 1413   UROBILINOGEN 0.2 01/01/2007 1340   NITRITE NEGATIVE 11/26/2015 2109   LEUKOCYTESUR NEGATIVE 11/26/2015 2109      Imaging Results:    Dg Chest 2 View  Result Date: 06/28/2016 CLINICAL DATA:  Infection, pain to the right toe EXAM: CHEST  2 VIEW COMPARISON:  09/23/2015 FINDINGS: Low lung volumes. No large pleural effusion. Streaky bibasilar atelectasis. Cardiomegaly. No pneumothorax. Surgical clips at the epigastrium. IMPRESSION: Low lung volumes with streaky left greater than right bibasilar atelectasis. Mild cardiomegaly. Electronically Signed   By: Donavan Foil M.D.   On: 06/28/2016 00:26   Dg Foot Complete Right  Result Date: 06/28/2016 CLINICAL DATA:  Infection, pain to the great toe with ulcer EXAM: RIGHT FOOT COMPLETE -  3+ VIEW COMPARISON:  09/12/2015 FINDINGS: No  fracture or malalignment. Mild osteopenia. Ulcer along the plantar surface of the first toe. Dorsal soft tissue swelling. IMPRESSION: Ulcer along the plantar surface of first toe. No definite underlying acute bony abnormalities. Electronically Signed   By: Donavan Foil M.D.   On: 06/28/2016 00:28    My personal review of EKG: Rhythm NSR   Assessment & Plan:    Active Problems:   Human immunodeficiency virus (HIV) disease ([name])   Chronic renal insufficiency, stage III (moderate)   OSA on CPAP   Toe ulcer, right ([place])    1. Infected diabetic foot ulcer- patient has been started on vancomycin and Zosyn. Will consult wound care in a.m. follow blood culture results 2. Right leg cellulitis- patient has significant erythema and warmth of right lower extremity, antibiotics has been started as above. 3. History of DVT/PE- continue Xarelto. 4. History of HIV- last CD4 count was 32 on 01/28/2016, continue antiretroviral therapy 5. Diabetes mellitus- continue Lantus, will start sliding scale insulin NovoLog. 6. Chronic kidney disease stage III- creatinine is 1.47, at baseline. Follow BMP in a.m.   DVT Prophylaxis-   Xarelto  AM Labs Ordered, also please review Full Orders  Family Communication: Admission, patients condition and plan of care including tests being ordered have been discussed with the patient  who indicate understanding and agree with the plan and Code Status.  Code Status:  DO NOT RESUSCITATE  Admission status: Observation    Time spent in minutes : 60 minutes   Daney Moor S M.D on 06/28/2016 at 2:16 AM  Between 7am to 7pm - Pager - [phone]. After 7pm go to [email or web address] - password [facility]  [facility] - Office  [phone]

## 2016-06-28 NOTE — ED Notes (Signed)
Patient has dressed and undressed 3 times. Scattering clothes about the floor. Noted to be urinating in the sink despite being given a urinal. Patient alert and oriented but voices a general "unhappiness with life".

## 2016-06-28 NOTE — Progress Notes (Addendum)
Pharmacy Antibiotic Note  Daniel [name] is a 61 y.o. male admitted on 06/27/2016 with cellulitis.  Pharmacy has been consulted for Vancomycin and Zosyn dosing.  Plan: Vancomycin 2262m loading dose given in ED, then 12572mIV every 24 hours.  Goal trough 10-15 mcg/mL.  Zosyn 3.375gm IV q8h EID over 4 hours F/U cxs and clinical progress Monitor labs, Vitals, and levels as indicated  Height: 5' 10.5" (179.1 cm) Weight: 236 lb 3.2 oz (107.1 kg) IBW/kg (Calculated) : 74.15  Temp (24hrs), Avg:98.7 F (37.1 C), Min:98.4 F (36.9 C), Max:99.1 F (37.3 C)   Recent Labs Lab 06/28/16 0010 06/28/16 0021 06/28/16 0702  WBC 7.4  --  5.9  CREATININE 1.46*  --   --   LATICACIDVEN  --  1.47  --     Estimated Creatinine Clearance: 64.9 mL/min (A) (by C-G formula based on SCr of 1.46 mg/dL (H)).    Allergies  Allergen Reactions  . Aspirin Swelling  . Ibuprofen Swelling  . Sustiva [Efavirenz] Swelling and Rash    And rash.  . Nsaids Other (See Comments)    unknwn    Antimicrobials this admission: Vancomycin 5/5 >>  Zosyn 5/5 >>   Dose adjustments this admission: N/A  Microbiology results: 5/5 BCx: pending  Thank you for allowing pharmacy to be a part of this patient's care.  LoIsac SarnaBS Pharm D, BCPS Clinical Pharmacist Pager #[phone]/06/2016 8:08 AM

## 2016-06-28 NOTE — ED Notes (Signed)
Patient pulling electrodes off and walking around in room. Pulled clothes out of his bag and scattered them on floor.

## 2016-06-28 NOTE — Progress Notes (Signed)
Patient's behavior is varied. Patient will be resting in bed cooperating, then will be trying to get out of bed, demanding medication. Patient educated of fall risk and encouraged not to get up unassisted. Bed alarm on.

## 2016-06-28 NOTE — Progress Notes (Signed)
Patient refusing bed alarm.  Explained to the patient that due to his fall at home he was considered a high fall risk and part of being a high fall risk is the use of bed alarms for safety.  Patient still refusing to be placed an bed alarm.  Will continue to monitor.

## 2016-06-28 NOTE — Progress Notes (Signed)
Cleansed great toe ulcer with saline and covered with a foam dressing.

## 2016-06-28 NOTE — Progress Notes (Signed)
ANTIBIOTIC CONSULT NOTE-Preliminary  Pharmacy Consult for Vancomycin and Zosyn Indication: Cellulitis  Allergies  Allergen Reactions  . Aspirin Swelling  . Ibuprofen Swelling  . Sustiva [Efavirenz] Swelling and Rash    And rash.  . Nsaids Other (See Comments)    unknwn    Patient Measurements: Height: 5' 11"  (180.3 cm) Weight: 252 lb (114.3 kg) IBW/kg (Calculated) : 75.3  Vital Signs: Temp: 99.1 F (37.3 C) (05/04 2121) Temp Source: Oral (05/04 2121) BP: 140/87 (05/04 2121) Pulse Rate: 109 (05/04 2121)  Labs: No results for input(s): WBC, HGB, PLT, LABCREA, CREATININE in the last 72 hours.  CrCl cannot be calculated (Patient's most recent lab result is older than the maximum 21 days allowed.).  No results for input(s): VANCOTROUGH, VANCOPEAK, VANCORANDOM, GENTTROUGH, GENTPEAK, GENTRANDOM, TOBRATROUGH, TOBRAPEAK, TOBRARND, AMIKACINPEAK, AMIKACINTROU, AMIKACIN in the last 72 hours.   Microbiology: No results found for this or any previous visit (from the past 720 hour(s)).  Medical History: Past Medical History:  Diagnosis Date  . ADHD (attention deficit hyperactivity disorder)   . Anxiety   . Chronic kidney disease   . Clotting disorder ([place])   . Depression   . Diabetes mellitus without complication ([place])   . Diabetes mellitus, type II ([place])   . Dizziness 03/17/2015  . GERD (gastroesophageal reflux disease)   . HIV disease ([place])   . HIV infection ([place])   . Liver disease   . OSA (obstructive sleep apnea) 07/25/2015   Uses CPAP regularly  . Peripheral vascular disease ([place])   . Ulcer     Medications:  Vancomycin 1 Gm IV given in the ED Zosyn 3.375 Gm IV given in the ED  Assessment: 61 yo diabetic male with hx CKD stage 3, HIV positive, seen in the ED for diabetic ulcers on his R foot. Pt is to be given empiric antibiotics. Blood cultures pending.  Goal of Therapy:  Vancomycin troughs 10-15 mcg/ml Eradicate infection  Plan:  Preliminary review of  pertinent patient information completed.  Protocol will be initiated with a one-time dose of Vancomycin 1250 mg IV in addition to the 1000 mg IV dose given in the ED, for a total loading dose of 2250 mg IV.  Forestine Na clinical pharmacist will complete review during morning rounds to assess patient and finalize treatment regimen if needed.  Norberto Sorenson, [facility] 06/28/2016,12:05 AM

## 2016-06-28 NOTE — ED Notes (Signed)
JD Daniel RN notified of pocket knife in patient's bag. He will secure this.

## 2016-06-28 NOTE — Progress Notes (Signed)
Patient asking for something for nausea and something for sinus headache.  On call MD notified and new orders received and carried out.  Patient also asking about Triumeq medication stating that he takes this medication at night.  Patient stated that he didn't receive the medication during the day but according to the [facility] ir was given at 10 AM. I told the patient that I would check with pharmacy to see if we could have the times changed for tomorrow since a dose was already charted for today.  Patient not happy but agrees.  Will continue to monitor the patient.

## 2016-06-28 NOTE — Progress Notes (Signed)
PROGRESS NOTE    Daniel [name] [facility]  ESL:[id number] DOB: 1955/12/26 DOA: 06/27/2016 PCP: [name], MD    Brief Narrative:  Daniel [name]  is a 61 y.o. male, With history of sick kidney stage III, HIV, DVT, PE on anticoagulation with Xarelto came to hospital with pain in the lower extremities both knees and feet. Patient has history of diabetic foot ulcer and is followed by PCP as outpatient. Patient says that he earlier fell at home but denies any injuries. He has been complaining of pain in the right foot also noticed increased warmth and redness. He denies nausea vomiting or diarrhea. No chest pain or shortness of breath. He denies dysuria. He denies fever.  In the ED, x-ray of right foot showed ulcer at the base of right big toe, no acute bony abnormalities. Lactic acid was 1.47   Assessment & Plan:   Active Problems:   Human immunodeficiency virus (HIV) disease ([name])   Chronic renal insufficiency, stage III (moderate)   OSA on CPAP   Toe ulcer, right ([name])   Diabetic foot infection ([place])  Infected diabetic foot ulcer - patient started on vancomycin and Zosyn - Follow up blood culture results - wound care consult placed  Right leg cellulitis - antibiotics as above  Fall on anticoagulants - CT head ordered to r/o bleed  History of DVT/PE - continue Xarelto.  History of HIV - last CD4 count was 32 on 01/28/2016 - continue antiretroviral therapy  Diabetes mellitus - continue Lantus - SSI - continue neuropathy medication (Lyrica)  Chronic kidney disease stage III - creatinine is 1.47 at admission - current creatinine of 1.34   DVT Prophylaxis-   Xarelto Family Communication: no family bedside Code Status:  DO NOT RESUSCITATE Disposition Plan: Likely home when cellulitis improved   Consultants:   None  Procedures:   None  Antimicrobials:   Zosyn  Vancomycin    Subjective: Patient is very agitated this am.  Reports that he has not gotten his  medications appropriately this morning OR the last time he was admitted to the hospital.  He is very frustrated that he states he did not receive his neuropathy medication even though medication history states it was dispensed at 0824 this am.  Patient reports he will not take medications until he gets all of them together.  Pain is same as it is chronically with his neuropathy as is his knee pain.  Objective: Vitals:   06/28/16 0100 06/28/16 0247 06/28/16 0255 06/28/16 0700  BP:   127/67 137/75  Pulse:  (!) 104 100 87  Resp: (!) 25 16 18 18   Temp:   98.5 F (36.9 C) 98.4 F (36.9 C)  TempSrc:  Oral Oral Oral  SpO2:  97% 96% 94%  Weight:   107.1 kg (236 lb 3.2 oz)   Height:   5' 10.5" (1.791 m)     Intake/Output Summary (Last 24 hours) at 06/28/16 1016 Last data filed at 06/28/16 0900  Gross per 24 hour  Intake              602 ml  Output              250 ml  Net              352 ml   Filed Weights   06/27/16 2151 06/28/16 0255  Weight: 114.3 kg (252 lb) 107.1 kg (236 lb 3.2 oz)    Examination:  General exam: Mild distress Respiratory system:  Clear to auscultation. Respiratory effort normal. Cardiovascular system: S1 & S2 heard, RRR. No JVD, murmurs, rubs, gallops or clicks. No pedal edema. Gastrointestinal system: Abdomen is nondistended, soft and nontender. No organomegaly or masses felt. Normal bowel sounds heard. Central nervous system: Alert and oriented. No focal neurological deficits. Extremities: Symmetric 5 x 5 power. Pain with flexion and extension at the knees bilaterally, swelling of the right great toe with bandage in place- under bandage 1cm circular ulcer with clean margins, no drainage noted Skin: Erythema of right foot that is warm Psychiatry: Judgement and insight appear normal. Mood is angry    Data Reviewed: I have personally reviewed following labs and imaging studies  CBC:  Recent Labs Lab 06/28/16 0010 06/28/16 0702  WBC 7.4 5.9  NEUTROABS 4.1   --   HGB 16.5 15.6  HCT 45.7 43.8  MCV 93.8 94.2  PLT 119* 939*   Basic Metabolic Panel:  Recent Labs Lab 06/28/16 0010 06/28/16 0702  NA 134* 135  K 3.9 3.5  CL 102 101  CO2 24 27  GLUCOSE 265* 245*  BUN 17 16  CREATININE 1.46* 1.34*  CALCIUM 9.3 8.4*   GFR: Estimated Creatinine Clearance: 70.7 mL/min (A) (by C-G formula based on SCr of 1.34 mg/dL (H)). Liver Function Tests:  Recent Labs Lab 06/28/16 0010 06/28/16 0702  AST 20 15  ALT 25 21  ALKPHOS 107 95  BILITOT 0.9 1.1  PROT 7.5 6.8  ALBUMIN 3.6 3.2*   No results for input(s): LIPASE, AMYLASE in the last 168 hours. No results for input(s): AMMONIA in the last 168 hours. Coagulation Profile: No results for input(s): INR, PROTIME in the last 168 hours. Cardiac Enzymes: No results for input(s): CKTOTAL, CKMB, CKMBINDEX, TROPONINI in the last 168 hours. BNP (last 3 results) No results for input(s): PROBNP in the last 8760 hours. HbA1C: No results for input(s): HGBA1C in the last 72 hours. CBG:  Recent Labs Lab 06/28/16 0805  GLUCAP 239*   Lipid Profile: No results for input(s): CHOL, HDL, LDLCALC, TRIG, CHOLHDL, LDLDIRECT in the last 72 hours. Thyroid Function Tests: No results for input(s): TSH, T4TOTAL, FREET4, T3FREE, THYROIDAB in the last 72 hours. Anemia Panel: No results for input(s): VITAMINB12, FOLATE, FERRITIN, TIBC, IRON, RETICCTPCT in the last 72 hours. Sepsis Labs:  Recent Labs Lab 06/28/16 0021  LATICACIDVEN 1.47    Recent Results (from the past 240 hour(s))  Blood Culture (routine x 2)     Status: None (Preliminary result)   Collection Time: 06/28/16  1:20 AM  Result Value Ref Range Status   Specimen Description RIGHT ANTECUBITAL  Final   Special Requests   Final    BOTTLES DRAWN AEROBIC AND ANAEROBIC Blood Culture adequate volume   Culture NO GROWTH < 12 HOURS  Final   Report Status PENDING  Incomplete  Blood Culture (routine x 2)     Status: None (Preliminary result)    Collection Time: 06/28/16  1:28 AM  Result Value Ref Range Status   Specimen Description BLOOD RIGHT HAND  Final   Special Requests   Final    BOTTLES DRAWN AEROBIC AND ANAEROBIC Blood Culture results may not be optimal due to an inadequate volume of blood received in culture bottles   Culture NO GROWTH < 12 HOURS  Final   Report Status PENDING  Incomplete         Radiology Studies: Dg Chest 2 View  Result Date: 06/28/2016 CLINICAL DATA:  Infection, pain to the right toe  EXAM: CHEST  2 VIEW COMPARISON:  09/23/2015 FINDINGS: Low lung volumes. No large pleural effusion. Streaky bibasilar atelectasis. Cardiomegaly. No pneumothorax. Surgical clips at the epigastrium. IMPRESSION: Low lung volumes with streaky left greater than right bibasilar atelectasis. Mild cardiomegaly. Electronically Signed   By: Donavan Foil M.D.   On: 06/28/2016 00:26   Dg Foot Complete Right  Result Date: 06/28/2016 CLINICAL DATA:  Infection, pain to the great toe with ulcer EXAM: RIGHT FOOT COMPLETE - 3+ VIEW COMPARISON:  09/12/2015 FINDINGS: No fracture or malalignment. Mild osteopenia. Ulcer along the plantar surface of the first toe. Dorsal soft tissue swelling. IMPRESSION: Ulcer along the plantar surface of first toe. No definite underlying acute bony abnormalities. Electronically Signed   By: Donavan Foil M.D.   On: 06/28/2016 00:28        Scheduled Meds: . abacavir  600 mg Oral Daily  . amphetamine-dextroamphetamine  30 mg Oral TID  . atorvastatin  10 mg Oral Daily  . divalproex  500 mg Oral QHS  . dolutegravir  50 mg Oral Daily  . insulin aspart  0-9 Units Subcutaneous TID WC  . insulin glargine  65 Units Subcutaneous BH-q7a  . lamiVUDine  300 mg Oral Daily  . levothyroxine  50 mcg Oral QHS  . pregabalin  75 mg Oral TID  . protriptyline  10 mg Oral TID  . Rivaroxaban  15 mg Oral Daily  . vortioxetine HBr  20 mg Oral QHS   Continuous Infusions: . sodium chloride 75 mL/hr at 06/28/16 0354  .  piperacillin-tazobactam (ZOSYN)  IV 3.375 g (06/28/16 0824)  . [START ON 06/29/2016] vancomycin       LOS: 0 days    Time spent: 30 minutes    Loretha Stapler, MD [facility] Pager [phone]  If 7PM-7AM, please contact night-coverage [email or web address] Password [email or web address] 06/28/2016, 10:16 AM

## 2016-06-29 DIAGNOSIS — Z794 Long term (current) use of insulin: Secondary | ICD-10-CM

## 2016-06-29 DIAGNOSIS — E0849 Diabetes mellitus due to underlying condition with other diabetic neurological complication: Secondary | ICD-10-CM

## 2016-06-29 DIAGNOSIS — L97511 Non-pressure chronic ulcer of other part of right foot limited to breakdown of skin: Secondary | ICD-10-CM

## 2016-06-29 DIAGNOSIS — W19XXXA Unspecified fall, initial encounter: Secondary | ICD-10-CM

## 2016-06-29 LAB — HEMOGLOBIN A1C
HEMOGLOBIN A1C: 8.7 % — AB (ref 4.8–5.6)
Mean Plasma Glucose: 203 mg/dL

## 2016-06-29 LAB — GLUCOSE, CAPILLARY: Glucose-Capillary: 153 mg/dL — ABNORMAL HIGH (ref 65–99)

## 2016-06-29 MED ORDER — DOXYCYCLINE HYCLATE 100 MG PO TABS
100.0000 mg | ORAL_TABLET | Freq: Two times a day (BID) | ORAL | Status: DC
  Administered 2016-06-29: 100 mg via ORAL
  Filled 2016-06-29: qty 1

## 2016-06-29 MED ORDER — DOXYCYCLINE HYCLATE 100 MG PO TABS: 100.0000 mg | ORAL_TABLET | Freq: Two times a day (BID) | ORAL | 0 refills | Status: AC

## 2016-06-29 NOTE — Consult Note (Signed)
[facility] Nurse wound consult note Reason for Consult: Clean full thickness chronic ulceration on plantar aspect of RGT, followed by PCP in the post acute care setting. Wound type:neoropathic Pressure Injury POA: No Measurement: 1.2 x 1 x  0.2cm Wound bed: pale pink, moist Drainage (amount, consistency, odor) scant serous Periwound:erythematous consistent with cellulitis Dressing procedure/placement/frequency: I will implement a twice daily saline dressing to the RGT and provide a pressure redistribution soft heel boot for elevation while in bed and to reduce incidence of trauma. I will defer to Hospitalist for ongoing management of cellulitis and response to treatment. [facility] nursing team will not follow, but will remain available to this patient, the nursing and medical teams.  Please re-consult if needed. Thanks, Maudie Flakes, MSN, RN, [place], Arther Abbott  Pager# [phone]

## 2016-06-29 NOTE — Progress Notes (Signed)
Patient states understanding of discharge, prescription given and  Rt. Toe dressed per order.

## 2016-06-29 NOTE — Discharge Summary (Signed)
Physician Discharge Summary  Daniel [name] [facility] TMH:[id number] DOB: 1955/03/08 DOA: 06/27/2016  PCP: [name], MD  Admit date: 06/27/2016 Discharge date: 06/29/2016  Admitted From: Home  Disposition:  Home   Recommendations for Outpatient Follow-up:  1. Follow up with PCP in 1-2 weeks 2. Take antibiotics as prescribed 3. Please schedule follow up with your podiatrist in the next week 4. Wound care per podiatry 5. Please obtain BMP/CBC in one week 6. Please return to the hospital with increase in erythema or with any falls  Home Health: No Equipment/Devices: None  Discharge Condition: Stable CODE STATUS: DNR Diet recommendation: Heart Healthy / Carb Modified  Brief/Interim Summary: Daniel [name] a 61 y.o.male,With history of sick kidney stage III, HIV, DVT, PE on anticoagulation with Xarelto came to hospital with pain in the lower extremities both knees and feet. Patient has history of diabetic foot ulcer and is followed by PCP as outpatient. Patient says that he earlier fell at home but denies any injuries. He underwent CT scan of the head and no acute intracranial bleed. He has been complaining of pain in the right foot also noticed increased warmth and redness. He denies nausea vomiting or diarrhea. No chest pain or shortness of breath. He denies dysuria. He denies fever.  In the ED, x-ray of right foot showed ulcer at the base of right big toe, no acute bony abnormalities. Lactic acid was 1.47.  Patient was started on Zosyn and Vancomyin and wound care was consulted.  Patient had significant improvement in cellulitis and was transitioned to Keflex on 06/29/16. He stated he felt as though he could care for his wound better at home.  Was told to take antibiotic as prescribed and to follow up with his podiatrist outpatient for further management of his foot wood.   Discharge Diagnoses:  Active Problems:   Human immunodeficiency virus (HIV) disease ([name])   Chronic renal  insufficiency, stage III (moderate)   OSA on CPAP   Toe ulcer, right ([place])   Diabetic foot infection [facility])    Discharge Instructions  Discharge Instructions    Call MD for:  difficulty breathing, headache or visual disturbances    Complete by:  As directed    Call MD for:  extreme fatigue    Complete by:  As directed    Call MD for:  hives    Complete by:  As directed    Call MD for:  persistant dizziness or light-headedness    Complete by:  As directed    Call MD for:  persistant nausea and vomiting    Complete by:  As directed    Call MD for:  severe uncontrolled pain    Complete by:  As directed    Call MD for:  temperature >100.4    Complete by:  As directed    Diet Carb Modified    Complete by:  As directed    Increase activity slowly    Complete by:  As directed      Allergies as of 06/29/2016      Reactions   Aspirin Swelling   Ibuprofen Swelling   Sustiva [efavirenz] Swelling, Rash   And rash.   Nsaids Other (See Comments)   unknwn      Medication List    STOP taking these medications   amoxicillin-clavulanate 875-125 MG tablet Commonly known as:  AUGMENTIN     TAKE these medications   acetaminophen 500 MG tablet Commonly known as:  TYLENOL Take 1,000 mg by  mouth every 8 (eight) hours as needed for moderate pain.   ALPRAZolam 1 MG tablet Commonly known as:  XANAX Take 1 mg by mouth 3 (three) times daily as needed for anxiety.   AMBIEN 10 MG tablet Generic drug:  zolpidem Take 10-20 mg by mouth at bedtime as needed for sleep.   amphetamine-dextroamphetamine 30 MG tablet Commonly known as:  ADDERALL Take 30 mg by mouth 3 (three) times daily.   atorvastatin 10 MG tablet Commonly known as:  LIPITOR TAKE ONE TABLET BY MOUTH DAILY.   diclofenac sodium 1 % Gel Commonly known as:  VOLTAREN APPLY 2 GRAMS TO EACH KNEE IN THE MORNING AND AT BEDTIME AND APPLY 1 GRAM TO EACH KNEE IN THE AFTERNOON.   divalproex 500 MG 24 hr tablet Commonly known as:   DEPAKOTE ER Take 1 tablet (500 mg total) by mouth at bedtime.   doxycycline 100 MG tablet Commonly known as:  VIBRA-TABS Take 1 tablet (100 mg total) by mouth every 12 (twelve) hours.   insulin aspart 100 UNIT/ML FlexPen Commonly known as:  NOVOLOG FLEXPEN 3 times a day (just before each meal) 06-28-13 units What changed:  how much to take  how to take this  when to take this  additional instructions   Insulin Glargine 100 UNIT/ML Solostar Pen Commonly known as:  LANTUS SOLOSTAR Inject 65 Units into the skin every morning.   levothyroxine 50 MCG tablet Commonly known as:  SYNTHROID, LEVOTHROID Take 1 tablet (50 mcg total) by mouth at bedtime.   LYRICA 75 MG capsule Generic drug:  pregabalin TAKE ONE CAPSULE BY MOUTH THREE TIMES DAILY.   ondansetron 4 MG disintegrating tablet Commonly known as:  ZOFRAN-ODT Take 2 tablets (8 mg total) by mouth every 8 (eight) hours as needed for nausea or vomiting.   Pen Needles 3/16" 31G X 5 MM Misc 1 Device by Other route 4 (four) times daily. Use to inject Lantus daily.   protriptyline 10 MG tablet Commonly known as:  VIVACTIL Take 10 mg by mouth 3 (three) times daily.   ranitidine 150 MG tablet Commonly known as:  ZANTAC Take 150 mg by mouth daily.   Rivaroxaban 15 MG Tabs tablet Commonly known as:  XARELTO Take 1 tablet (15 mg total) by mouth daily.   TRINTELLIX 20 MG Tabs Generic drug:  vortioxetine HBr Take 20 mg by mouth at bedtime.   TRIUMEQ 157-26-203 MG tablet Generic drug:  abacavir-dolutegravir-lamiVUDine TAKE 1 TABLET BY MOUTH DAILY   UNABLE TO FIND CPAP MACHINE with standard Aclaim nasal mask with humidifier. Set at 14 cwp       Allergies  Allergen Reactions  . Aspirin Swelling  . Ibuprofen Swelling  . Sustiva [Efavirenz] Swelling and Rash    And rash.  . Nsaids Other (See Comments)    unknwn    Consultations:  Wound Care   Procedures/Studies: Dg Chest 2 View  Result Date:  06/28/2016 CLINICAL DATA:  Infection, pain to the right toe EXAM: CHEST  2 VIEW COMPARISON:  09/23/2015 FINDINGS: Low lung volumes. No large pleural effusion. Streaky bibasilar atelectasis. Cardiomegaly. No pneumothorax. Surgical clips at the epigastrium. IMPRESSION: Low lung volumes with streaky left greater than right bibasilar atelectasis. Mild cardiomegaly. Electronically Signed   By: Donavan Foil M.D.   On: 06/28/2016 00:26   Ct Head Wo Contrast  Result Date: 06/28/2016 CLINICAL DATA:  Recent fall 2 days ago, forehead laceration, on Xarelto. EXAM: CT HEAD WITHOUT CONTRAST TECHNIQUE: Contiguous axial images were obtained from  the base of the skull through the vertex without intravenous contrast. COMPARISON:  08/09/2014 FINDINGS: Brain: Stable mild brain atrophy pattern and minimal chronic white matter microvascular ischemic changes about the ventricles. Slight ventricular prominence. No acute intracranial hemorrhage, mass lesion, acute infarction, midline shift, herniation, hydrocephalus, or extra-axial fluid collection. No cerebellar abnormality. Cisterns remain patent. No focal mass effect or edema. Vascular: No hyperdense vessel or unexpected calcification. Skull: Normal. Negative for fracture or focal lesion. Sinuses/Orbits: The orbits are symmetric and unremarkable. Minor scattered sphenoid and ethmoid mucosal thickening. Otherwise sinuses and mastoids remain clear. Other: None. IMPRESSION: Stable brain atrophy pattern without acute intracranial abnormality. No significant interval change or acute process by noncontrast CT. Electronically Signed   By: Jerilynn Mages.  Shick M.D.   On: 06/28/2016 11:41   Dg Foot Complete Right  Result Date: 06/28/2016 CLINICAL DATA:  Infection, pain to the great toe with ulcer EXAM: RIGHT FOOT COMPLETE - 3+ VIEW COMPARISON:  09/12/2015 FINDINGS: No fracture or malalignment. Mild osteopenia. Ulcer along the plantar surface of the first toe. Dorsal soft tissue swelling. IMPRESSION:  Ulcer along the plantar surface of first toe. No definite underlying acute bony abnormalities. Electronically Signed   By: Donavan Foil M.D.   On: 06/28/2016 00:28       Subjective: Patient sitting in bed watching television.  He is very upset because "he has not seen a doctor for almost two days" (provider saw him yesterday but he does not recall).  He wants to go home. Says his foot and leg swelling is better because he took his knee brace off.  Denies any pain in his right leg or foot.  Doesn't think antibiotics helped.  Discharge Exam: Vitals:   06/28/16 2138 06/29/16 0500  BP:  139/89  Pulse: 78 80  Resp: 20 20  Temp:  97.7 F (36.5 C)   Vitals:   06/28/16 1605 06/28/16 2132 06/28/16 2138 06/29/16 0500  BP: 140/78 (!) 159/85  139/89  Pulse: 93 93 78 80  Resp: 18 20 20 20   Temp: 98.6 F (37 C) 98.5 F (36.9 C)  97.7 F (36.5 C)  TempSrc: Oral Oral  Oral  SpO2: 97% 100% 98% 100%  Weight:    109.5 kg (241 lb 8 oz)  Height:        General: Pt is alert, awake, not in acute distress Cardiovascular: RRR, S1/S2 +, no rubs, no gallops Respiratory: CTA bilaterally, no wheezing, no rhonchi Abdominal: Soft, NT, ND, bowel sounds + Extremities: 1+ edema of the right foot and ankle that is non pitting, no erythema noted, no cyanosis Skin: 1cm ulcer on right posterolateral toe with clean margins, no discharge, minimal erythema  Telemetry Strip showing NSR  The results of significant diagnostics from this hospitalization (including imaging, microbiology, ancillary and laboratory) are listed below for reference.     Microbiology: Recent Results (from the past 240 hour(s))  Blood Culture (routine x 2)     Status: None (Preliminary result)   Collection Time: 06/28/16  1:20 AM  Result Value Ref Range Status   Specimen Description RIGHT ANTECUBITAL  Final   Special Requests   Final    BOTTLES DRAWN AEROBIC AND ANAEROBIC Blood Culture adequate volume   Culture NO GROWTH < 12 HOURS   Final   Report Status PENDING  Incomplete  Blood Culture (routine x 2)     Status: None (Preliminary result)   Collection Time: 06/28/16  1:28 AM  Result Value Ref Range Status   Specimen  Description BLOOD RIGHT HAND  Final   Special Requests   Final    BOTTLES DRAWN AEROBIC AND ANAEROBIC Blood Culture results may not be optimal due to an inadequate volume of blood received in culture bottles   Culture NO GROWTH < 12 HOURS  Final   Report Status PENDING  Incomplete     Labs: BNP (last 3 results) No results for input(s): BNP in the last 8760 hours. Basic Metabolic Panel:  Recent Labs Lab 06/28/16 0010 06/28/16 0702  NA 134* 135  K 3.9 3.5  CL 102 101  CO2 24 27  GLUCOSE 265* 245*  BUN 17 16  CREATININE 1.46* 1.34*  CALCIUM 9.3 8.4*   Liver Function Tests:  Recent Labs Lab 06/28/16 0010 06/28/16 0702  AST 20 15  ALT 25 21  ALKPHOS 107 95  BILITOT 0.9 1.1  PROT 7.5 6.8  ALBUMIN 3.6 3.2*   No results for input(s): LIPASE, AMYLASE in the last 168 hours. No results for input(s): AMMONIA in the last 168 hours. CBC:  Recent Labs Lab 06/28/16 0010 06/28/16 0702  WBC 7.4 5.9  NEUTROABS 4.1  --   HGB 16.5 15.6  HCT 45.7 43.8  MCV 93.8 94.2  PLT 119* 111*   Cardiac Enzymes: No results for input(s): CKTOTAL, CKMB, CKMBINDEX, TROPONINI in the last 168 hours. BNP: Invalid input(s): POCBNP CBG:  Recent Labs Lab 06/28/16 0805 06/28/16 1614 06/28/16 2113 06/29/16 0806  GLUCAP 239* 298* 191* 153*   D-Dimer No results for input(s): DDIMER in the last 72 hours. Hgb A1c  Recent Labs  06/28/16 0000  HGBA1C 8.7*   Lipid Profile No results for input(s): CHOL, HDL, LDLCALC, TRIG, CHOLHDL, LDLDIRECT in the last 72 hours. Thyroid function studies No results for input(s): TSH, T4TOTAL, T3FREE, THYROIDAB in the last 72 hours.  Invalid input(s): FREET3 Anemia work up No results for input(s): VITAMINB12, FOLATE, FERRITIN, TIBC, IRON, RETICCTPCT in the last 72  hours. Urinalysis    Component Value Date/Time   COLORURINE YELLOW 06/28/2016 1423   APPEARANCEUR HAZY (A) 06/28/2016 1423   LABSPEC 1.022 06/28/2016 1423   PHURINE 7.0 06/28/2016 1423   GLUCOSEU >=500 (A) 06/28/2016 1423   GLUCOSEU NEG mg/dL 08/21/2006 0220   HGBUR SMALL (A) 06/28/2016 1423   HGBUR trace-intact 01/01/2007 1340   BILIRUBINUR NEGATIVE 06/28/2016 1423   BILIRUBINUR negative 07/11/2015 1413   BILIRUBINUR neg 08/09/2014 1401   KETONESUR NEGATIVE 06/28/2016 1423   PROTEINUR 30 (A) 06/28/2016 1423   UROBILINOGEN 1.0 07/11/2015 1413   UROBILINOGEN 0.2 01/01/2007 1340   NITRITE NEGATIVE 06/28/2016 1423   LEUKOCYTESUR NEGATIVE 06/28/2016 1423   Sepsis Labs Invalid input(s): PROCALCITONIN,  WBC,  LACTICIDVEN Microbiology Recent Results (from the past 240 hour(s))  Blood Culture (routine x 2)     Status: None (Preliminary result)   Collection Time: 06/28/16  1:20 AM  Result Value Ref Range Status   Specimen Description RIGHT ANTECUBITAL  Final   Special Requests   Final    BOTTLES DRAWN AEROBIC AND ANAEROBIC Blood Culture adequate volume   Culture NO GROWTH < 12 HOURS  Final   Report Status PENDING  Incomplete  Blood Culture (routine x 2)     Status: None (Preliminary result)   Collection Time: 06/28/16  1:28 AM  Result Value Ref Range Status   Specimen Description BLOOD RIGHT HAND  Final   Special Requests   Final    BOTTLES DRAWN AEROBIC AND ANAEROBIC Blood Culture results may not be optimal due  to an inadequate volume of blood received in culture bottles   Culture NO GROWTH < 12 HOURS  Final   Report Status PENDING  Incomplete     Time coordinating discharge: Over 30 minutes  SIGNED:   Loretha Stapler, MD  [facility] 06/29/2016, 10:54 AM Pager [phone] If 7PM-7AM, please contact night-coverage [email or web address] Password [email or web address]

## 2016-06-30 LAB — GLUCOSE, CAPILLARY
GLUCOSE-CAPILLARY: 253 mg/dL — AB (ref 65–99)
Glucose-Capillary: 248 mg/dL — ABNORMAL HIGH (ref 65–99)

## 2016-07-01 NOTE — Telephone Encounter (Signed)
Let's see about Mimedx Epifix 20m disk instead Thanks Dr. SChauncey Cruel

## 2016-07-03 LAB — CULTURE, BLOOD (ROUTINE X 2)
CULTURE: NO GROWTH
CULTURE: NO GROWTH
Special Requests: ADEQUATE

## 2016-07-03 NOTE — Telephone Encounter (Signed)
Thank you :)

## 2016-07-05 ENCOUNTER — Telehealth: Payer: Self-pay | Admitting: Family Medicine

## 2016-07-05 NOTE — Telephone Encounter (Signed)
Diabetic supplies [phone] Advised Pt is calling to get a refill on his diabetic supplies.  He states he has lost his meter and needs a refill on his test strips.  He states he has been seeing the endocrinologist for his diabetes and I did advise that he call them for a refill.

## 2016-07-07 ENCOUNTER — Other Ambulatory Visit: Payer: Self-pay | Admitting: Emergency Medicine

## 2016-07-07 ENCOUNTER — Telehealth: Payer: Self-pay | Admitting: Endocrinology

## 2016-07-08 ENCOUNTER — Ambulatory Visit (INDEPENDENT_AMBULATORY_CARE_PROVIDER_SITE_OTHER): Payer: BLUE CROSS/BLUE SHIELD | Admitting: Sports Medicine

## 2016-07-08 ENCOUNTER — Encounter: Payer: Self-pay | Admitting: Sports Medicine

## 2016-07-08 DIAGNOSIS — E1140 Type 2 diabetes mellitus with diabetic neuropathy, unspecified: Secondary | ICD-10-CM

## 2016-07-08 DIAGNOSIS — E1149 Type 2 diabetes mellitus with other diabetic neurological complication: Secondary | ICD-10-CM

## 2016-07-08 DIAGNOSIS — L97512 Non-pressure chronic ulcer of other part of right foot with fat layer exposed: Secondary | ICD-10-CM | POA: Diagnosis not present

## 2016-07-08 DIAGNOSIS — I739 Peripheral vascular disease, unspecified: Secondary | ICD-10-CM

## 2016-07-08 NOTE — Progress Notes (Signed)
Subjective: Daniel [name] is a 61 y.o. male patient seen in office for f/u evaluation of ulceration of the right 1st toe. Reports that he was admitted in the hospital for a few days after a fall and does not remember anything. FBS in office today was not recorded.  Patient has no other pedal complaints at this time.  Patient Active Problem List   Diagnosis Date Noted  . Diabetic foot infection ([place]) 06/28/2016  . Screening examination for venereal disease 02/11/2016  . Critical lower limb ischemia 12/07/2015  . Fall 12/05/2015  . Toe ulcer, right ([place]) 09/19/2015  . Decreased pedal pulses 09/19/2015  . OSA on CPAP 09/05/2015  . Major depressive disorder, recurrent episode, moderate ([place]) 09/05/2015  . OSA (obstructive sleep apnea) 07/25/2015  . Passed out 06/28/2015  . Low back pain 06/11/2015  . Abnormality of gait 06/11/2015  . Dizziness 03/17/2015  . Weakness 02/21/2015  . Chronic renal insufficiency, stage III (moderate) 08/09/2014  . Diabetes ([place]) 11/07/2013  . Hematuria 06/21/2013  . Hepatic steatosis 09/09/2010  . UTI 09/15/2006  . PYURIA 09/03/2006  . Human immunodeficiency virus (HIV) disease ([place]) 06/04/2006  . HERPES ZOSTER, UNCOMPLICATED 35/46/5681  . HSV 06/04/2006  . Depression 06/04/2006  . DISORDER, ATTENTION DEFICIT W/HYPERACTIVITY 06/04/2006  . THROMBOPHLEBITIS NOS 06/04/2006  . GERD 06/04/2006  . ARTHRITIS, HAND 06/04/2006  . HYPERGLYCEMIA, HX OF 06/04/2006  . HEPATITIS B, HX OF 06/04/2006   Current Outpatient Prescriptions on File Prior to Visit  Medication Sig Dispense Refill  . acetaminophen (TYLENOL) 500 MG tablet Take 1,000 mg by mouth every 8 (eight) hours as needed for moderate pain.     [name] ALPRAZolam (XANAX) 1 MG tablet Take 1 mg by mouth 3 (three) times daily as needed for anxiety.     [name] amphetamine-dextroamphetamine (ADDERALL) 30 MG tablet Take 30 mg by mouth 3 (three) times daily.     [name] atorvastatin (LIPITOR) 10 MG tablet TAKE ONE TABLET  BY MOUTH DAILY. 90 tablet 0  . diclofenac sodium (VOLTAREN) 1 % GEL APPLY 2 GRAMS TO EACH KNEE IN THE MORNING AND AT BEDTIME AND APPLY 1 GRAM TO EACH KNEE IN THE AFTERNOON. 300 g 0  . divalproex (DEPAKOTE ER) 500 MG 24 hr tablet Take 1 tablet (500 mg total) by mouth at bedtime. 90 tablet 4  . insulin aspart (NOVOLOG FLEXPEN) 100 UNIT/ML FlexPen 3 times a day (just before each meal) 06-28-13 units (Patient taking differently: Inject 10-15 Units into the skin 3 (three) times daily with meals. 3 times a day (just before each meal) 12-03-13 units) 15 mL 11  . Insulin Glargine (LANTUS SOLOSTAR) 100 UNIT/ML Solostar Pen Inject 65 Units into the skin every morning. 10 pen PRN  . Insulin Pen Needle (PEN NEEDLES 3/16") 31G X 5 MM MISC 1 Device by Other route 4 (four) times daily. Use to inject Lantus daily. 120 each 11  . levothyroxine (SYNTHROID, LEVOTHROID) 50 MCG tablet Take 1 tablet (50 mcg total) by mouth at bedtime. 30 tablet 11  . LYRICA 75 MG capsule TAKE ONE CAPSULE BY MOUTH THREE TIMES DAILY. 90 capsule 5  . ondansetron (ZOFRAN-ODT) 4 MG disintegrating tablet Take 2 tablets (8 mg total) by mouth every 8 (eight) hours as needed for nausea or vomiting. 60 tablet 5  . protriptyline (VIVACTIL) 10 MG tablet Take 10 mg by mouth 3 (three) times daily.   11  . ranitidine (ZANTAC) 150 MG tablet Take 150 mg by mouth daily.    [name]  rivaroxaban (XARELTO) 15 MG TABS tablet Take 1 tablet (15 mg total) by mouth daily. 30 tablet 11  . TRINTELLIX 20 MG TABS Take 20 mg by mouth at bedtime.    . TRIUMEQ 600-50-300 MG tablet TAKE 1 TABLET BY MOUTH DAILY 30 tablet 6  . UNABLE TO FIND CPAP MACHINE with standard [employer] nasal mask with humidifier. Set at 14 cwp 1 each 0  . zolpidem (AMBIEN) 10 MG tablet Take 10-20 mg by mouth at bedtime as needed for sleep.      No current facility-administered medications on file prior to visit.    Allergies  Allergen Reactions  . Aspirin Swelling  . Ibuprofen Swelling  . Sustiva  [Efavirenz] Swelling and Rash    And rash.  . Nsaids Other (See Comments)    unknwn    Recent Results (from the past 2160 hour(s))  POCT glycosylated hemoglobin (Hb A1C)     Status: None   Collection Time: 04/25/16  3:41 PM  Result Value Ref Range   Hemoglobin A1C 7.7   Comprehensive metabolic panel     Status: Abnormal   Collection Time: 06/05/16  5:28 PM  Result Value Ref Range   Glucose 190 (H) 65 - 99 mg/dL   BUN 24 8 - 27 mg/dL   Creatinine, Ser 1.67 (H) 0.76 - 1.27 mg/dL   GFR calc non Af Amer 43 (L) >59 mL/min/1.73   GFR calc Af Amer 50 (L) >59 mL/min/1.73   BUN/Creatinine Ratio 14 10 - 24   Sodium 142 134 - 144 mmol/L   Potassium 4.4 3.5 - 5.2 mmol/L   Chloride 102 96 - 106 mmol/L   CO2 25 18 - 29 mmol/L   Calcium 9.4 8.6 - 10.2 mg/dL   Total Protein 7.1 6.0 - 8.5 g/dL   Albumin 4.2 3.6 - 4.8 g/dL   Globulin, Total 2.9 1.5 - 4.5 g/dL   Albumin/Globulin Ratio 1.4 1.2 - 2.2   Bilirubin Total 0.6 0.0 - 1.2 mg/dL   Alkaline Phosphatase 122 (H) 39 - 117 IU/L   AST 19 0 - 40 IU/L   ALT 28 0 - 44 IU/L  Microalbumin, urine     Status: None   Collection Time: 06/05/16  5:28 PM  Result Value Ref Range   Albumin, Urine 92.2 Not Estab. ug/mL  Lipid panel     Status: Abnormal   Collection Time: 06/05/16  5:28 PM  Result Value Ref Range   Cholesterol, Total 164 100 - 199 mg/dL   Triglycerides 356 (H) 0 - 149 mg/dL   HDL 37 (L) >39 mg/dL   VLDL Cholesterol Cal 71 (H) 5 - 40 mg/dL   LDL Calculated 56 0 - 99 mg/dL   Chol/HDL Ratio 4.4 0.0 - 5.0 ratio    Comment:                                   T. Chol/HDL Ratio                                             Men  Women                               1/2 Avg.Risk  3.4    3.3  Avg.Risk  5.0    4.4                                2X Avg.Risk  9.6    7.1                                3X Avg.Risk 23.4   11.0   Hemoglobin A1c     Status: Abnormal   Collection Time: 06/28/16 12:00 AM  Result Value  Ref Range   Hgb A1c MFr Bld 8.7 (H) 4.8 - 5.6 %    Comment: (NOTE)         Pre-diabetes: 5.7 - 6.4         Diabetes: >6.4         Glycemic control for adults with diabetes: <7.0    Mean Plasma Glucose 203 mg/dL    Comment: (NOTE) Performed At: [facility] [place], [place] [id number] Lindon Romp MD [id number]   Comprehensive metabolic panel     Status: Abnormal   Collection Time: 06/28/16 12:10 AM  Result Value Ref Range   Sodium 134 (L) 135 - 145 mmol/L   Potassium 3.9 3.5 - 5.1 mmol/L   Chloride 102 101 - 111 mmol/L   CO2 24 22 - 32 mmol/L   Glucose, Bld 265 (H) 65 - 99 mg/dL   BUN 17 6 - 20 mg/dL   Creatinine, Ser 1.46 (H) 0.61 - 1.24 mg/dL   Calcium 9.3 8.9 - 10.3 mg/dL   Total Protein 7.5 6.5 - 8.1 g/dL   Albumin 3.6 3.5 - 5.0 g/dL   AST 20 15 - 41 U/L   ALT 25 17 - 63 U/L   Alkaline Phosphatase 107 38 - 126 U/L   Total Bilirubin 0.9 0.3 - 1.2 mg/dL   GFR calc non Af Amer 50 (L) >60 mL/min   GFR calc Af Amer 58 (L) >60 mL/min    Comment: (NOTE) The eGFR has been calculated using the CKD EPI equation. This calculation has not been validated in all clinical situations. eGFR's persistently <60 mL/min signify possible Chronic Kidney Disease.    Anion gap 8 5 - 15  CBC WITH DIFFERENTIAL     Status: Abnormal   Collection Time: 06/28/16 12:10 AM  Result Value Ref Range   WBC 7.4 4.0 - 10.5 K/uL   RBC 4.87 4.22 - 5.81 MIL/uL   Hemoglobin 16.5 13.0 - 17.0 g/dL   HCT 45.7 39.0 - 52.0 %   MCV 93.8 78.0 - 100.0 fL   MCH 33.9 26.0 - 34.0 pg   MCHC 36.1 (H) 30.0 - 36.0 g/dL   RDW 14.0 11.5 - 15.5 %   Platelets 119 (L) 150 - 400 K/uL   Neutrophils Relative % 56 %   Neutro Abs 4.1 1.7 - 7.7 K/uL   Lymphocytes Relative 28 %   Lymphs Abs 2.1 0.7 - 4.0 K/uL   Monocytes Relative 14 %   Monocytes Absolute 1.0 0.1 - 1.0 K/uL   Eosinophils Relative 1 %   Eosinophils Absolute 0.1 0.0 - 0.7 K/uL   Basophils Relative 0 %   Basophils Absolute 0.0  0.0 - 0.1 K/uL  I-Stat CG4 Lactic Acid, ED  (not at  [facility])     Status: None   Collection Time: 06/28/16 12:21 AM  Result Value Ref Range  Lactic Acid, Venous 1.47 0.5 - 1.9 mmol/L  Blood Culture (routine x 2)     Status: None   Collection Time: 06/28/16  1:20 AM  Result Value Ref Range   Specimen Description RIGHT ANTECUBITAL    Special Requests      BOTTLES DRAWN AEROBIC AND ANAEROBIC Blood Culture adequate volume   Culture NO GROWTH 5 DAYS    Report Status 07/03/2016 FINAL   Blood Culture (routine x 2)     Status: None   Collection Time: 06/28/16  1:28 AM  Result Value Ref Range   Specimen Description BLOOD RIGHT HAND    Special Requests      BOTTLES DRAWN AEROBIC AND ANAEROBIC Blood Culture results may not be optimal due to an inadequate volume of blood received in culture bottles   Culture NO GROWTH 5 DAYS    Report Status 07/03/2016 FINAL   CBC     Status: Abnormal   Collection Time: 06/28/16  7:02 AM  Result Value Ref Range   WBC 5.9 4.0 - 10.5 K/uL   RBC 4.65 4.22 - 5.81 MIL/uL   Hemoglobin 15.6 13.0 - 17.0 g/dL   HCT 43.8 39.0 - 52.0 %   MCV 94.2 78.0 - 100.0 fL   MCH 33.5 26.0 - 34.0 pg   MCHC 35.6 30.0 - 36.0 g/dL   RDW 14.0 11.5 - 15.5 %   Platelets 111 (L) 150 - 400 K/uL    Comment: SPECIMEN CHECKED FOR CLOTS PLATELET COUNT CONFIRMED BY SMEAR   Comprehensive metabolic panel     Status: Abnormal   Collection Time: 06/28/16  7:02 AM  Result Value Ref Range   Sodium 135 135 - 145 mmol/L   Potassium 3.5 3.5 - 5.1 mmol/L   Chloride 101 101 - 111 mmol/L   CO2 27 22 - 32 mmol/L   Glucose, Bld 245 (H) 65 - 99 mg/dL   BUN 16 6 - 20 mg/dL   Creatinine, Ser 1.34 (H) 0.61 - 1.24 mg/dL   Calcium 8.4 (L) 8.9 - 10.3 mg/dL   Total Protein 6.8 6.5 - 8.1 g/dL   Albumin 3.2 (L) 3.5 - 5.0 g/dL   AST 15 15 - 41 U/L   ALT 21 17 - 63 U/L   Alkaline Phosphatase 95 38 - 126 U/L   Total Bilirubin 1.1 0.3 - 1.2 mg/dL   GFR calc non Af Amer 55 (L) >60 mL/min   GFR calc Af Amer  >60 >60 mL/min    Comment: (NOTE) The eGFR has been calculated using the CKD EPI equation. This calculation has not been validated in all clinical situations. eGFR's persistently <60 mL/min signify possible Chronic Kidney Disease.    Anion gap 7 5 - 15  Glucose, capillary     Status: Abnormal   Collection Time: 06/28/16  8:05 AM  Result Value Ref Range   Glucose-Capillary 239 (H) 65 - 99 mg/dL   Comment 1 Notify RN   Glucose, capillary     Status: Abnormal   Collection Time: 06/28/16 12:12 PM  Result Value Ref Range   Glucose-Capillary 248 (H) 65 - 99 mg/dL   Comment 1 Notify RN   Urinalysis, Routine w reflex microscopic     Status: Abnormal   Collection Time: 06/28/16  2:23 PM  Result Value Ref Range   Color, Urine YELLOW YELLOW   APPearance HAZY (A) CLEAR   Specific Gravity, Urine 1.022 1.005 - 1.030   pH 7.0 5.0 - 8.0   Glucose,  UA >=500 (A) NEGATIVE mg/dL   Hgb urine dipstick SMALL (A) NEGATIVE   Bilirubin Urine NEGATIVE NEGATIVE   Ketones, ur NEGATIVE NEGATIVE mg/dL   Protein, ur 30 (A) NEGATIVE mg/dL   Nitrite NEGATIVE NEGATIVE   Leukocytes, UA NEGATIVE NEGATIVE   RBC / HPF 0-5 0 - 5 RBC/hpf   WBC, UA 0-5 0 - 5 WBC/hpf   Bacteria, UA NONE SEEN NONE SEEN   Squamous Epithelial / LPF 0-5 (A) NONE SEEN  Glucose, capillary     Status: Abnormal   Collection Time: 06/28/16  4:14 PM  Result Value Ref Range   Glucose-Capillary 298 (H) 65 - 99 mg/dL   Comment 1 Notify RN   Glucose, capillary     Status: Abnormal   Collection Time: 06/28/16  9:13 PM  Result Value Ref Range   Glucose-Capillary 191 (H) 65 - 99 mg/dL   Comment 1 Notify RN    Comment 2 Document in Chart   Glucose, capillary     Status: Abnormal   Collection Time: 06/29/16  8:06 AM  Result Value Ref Range   Glucose-Capillary 153 (H) 65 - 99 mg/dL   Comment 1 Notify RN   Glucose, capillary     Status: Abnormal   Collection Time: 06/29/16 12:07 PM  Result Value Ref Range   Glucose-Capillary 253 (H) 65 - 99  mg/dL   Comment 1 Notify RN     Objective: There were no vitals filed for this visit.  General: Patient is awake, alert, oriented x 3 and in no acute distress but with somber mood.  Dermatology: Skin is warm and dry bilateral with a full thickness ulceration present Plantar right hallux,  measures 1.2x0.7x0.2cm with granular base (slightly smaller than previous) with maceration and periwound callus, the ulceration does not probe to bone. There is no malodor, no active drainage, no erythema, decreased focal edema. No other acute signs of infection. No other open lesions.    Vascular: Dorsalis Pedis pulse = +4/4 on right, left 0/4,  Posterior Tibial pulse = 0/4 Bilateral,  Capillary Fill Time < 5 seconds. 1+ pitting edema and mild varicosities.  Neurologic: Protective sensation severely diminished to the level of the ankles with the 5.07/10g [employer].  Musculosketal: No Pain with palpation to ulcerated area at right 1st toe. No pain with compression to calves bilateral.  Assessment and Plan:  Problem List Items Addressed This Visit    None    Visit Diagnoses    Right foot ulcer, with fat layer exposed ([place])    -  Primary   PAD (peripheral artery disease) ([place])       Diabetic neuropathy with neurologic complication ([place])          -Examined patient and discussed the progression of the wounds and treatment alternatives. -Excisionally debrided ulcer at right hallux to healthy bleeding, Cleansed and applied Epifix 24 mm disk, reorder number [id number], GS [id number], expires 02/25/2019, this is graft #1 placed to site, the entire graft was used with no waste and secured with Adaptic and Steri-Strips and dressed with saline moistened gauze, 2 x 2's and [place]. Advised patient to keep dressing clean, dry and intact. If he does need to change the dressing, then recommend only changing the outer dressing layers as instructed. -Advised patient to continue with follow up with  PCP for more recs on controlling blood sugars   -Advised patient to go to the ER if worsens -Continue with postoperative shoe and encouraged limited activity to  assist with healing  -Patient to return to office in 1 week for possible reapplication of Epifix on right 1st toe or sooner if problems arise.  Landis Martins, DPM

## 2016-07-09 NOTE — Telephone Encounter (Signed)
-----   Message from Landis Martins, [place] sent at 07/08/2016  6:34 PM EDT ----- Regarding: Order another graft for next week Mimedx Epifix 55m disk -Dr. SCannon Kettle

## 2016-07-10 ENCOUNTER — Ambulatory Visit (INDEPENDENT_AMBULATORY_CARE_PROVIDER_SITE_OTHER): Payer: Self-pay | Admitting: Sports Medicine

## 2016-07-10 ENCOUNTER — Encounter: Payer: Self-pay | Admitting: Sports Medicine

## 2016-07-10 DIAGNOSIS — L97512 Non-pressure chronic ulcer of other part of right foot with fat layer exposed: Secondary | ICD-10-CM

## 2016-07-10 DIAGNOSIS — E1149 Type 2 diabetes mellitus with other diabetic neurological complication: Secondary | ICD-10-CM

## 2016-07-10 DIAGNOSIS — R0989 Other specified symptoms and signs involving the circulatory and respiratory systems: Secondary | ICD-10-CM

## 2016-07-10 DIAGNOSIS — I739 Peripheral vascular disease, unspecified: Secondary | ICD-10-CM

## 2016-07-10 DIAGNOSIS — E1140 Type 2 diabetes mellitus with diabetic neuropathy, unspecified: Secondary | ICD-10-CM

## 2016-07-10 NOTE — Progress Notes (Signed)
Patient presents to office today for me to change his outer dressings on his right first toe. Patient was seen on Tuesday of this week and had Epifix graft #1 placed. Patient states that he was nervous that might pull off the graft and wanted me to do the first dressing change. Patient denies any constitutional symptoms and reports that he is doing okay. However, patient appears a little flustered because he got confused and very lost trying to get to [place] office location. Outer dressings were changed. Advised patient to keep dressing clean and dry and that he can perform his next outer dressing change as instructed on Saturday and then will see me in office on Tuesday of next week for possible reapplication of EpiFix. Patient expressed understanding, patient to follow up as scheduled.

## 2016-07-11 ENCOUNTER — Other Ambulatory Visit: Payer: Self-pay | Admitting: Emergency Medicine

## 2016-07-11 DIAGNOSIS — E038 Other specified hypothyroidism: Secondary | ICD-10-CM

## 2016-07-11 NOTE — Telephone Encounter (Signed)
[employer] calling he is now enrolled in case management services [phone] [facility]

## 2016-07-15 ENCOUNTER — Ambulatory Visit: Payer: BLUE CROSS/BLUE SHIELD | Admitting: Sports Medicine

## 2016-07-16 ENCOUNTER — Ambulatory Visit (INDEPENDENT_AMBULATORY_CARE_PROVIDER_SITE_OTHER): Payer: BLUE CROSS/BLUE SHIELD | Admitting: Sports Medicine

## 2016-07-16 ENCOUNTER — Encounter: Payer: Self-pay | Admitting: Sports Medicine

## 2016-07-16 DIAGNOSIS — L97512 Non-pressure chronic ulcer of other part of right foot with fat layer exposed: Secondary | ICD-10-CM

## 2016-07-16 DIAGNOSIS — R0989 Other specified symptoms and signs involving the circulatory and respiratory systems: Secondary | ICD-10-CM

## 2016-07-16 DIAGNOSIS — E1140 Type 2 diabetes mellitus with diabetic neuropathy, unspecified: Secondary | ICD-10-CM

## 2016-07-16 DIAGNOSIS — E1149 Type 2 diabetes mellitus with other diabetic neurological complication: Secondary | ICD-10-CM

## 2016-07-16 NOTE — Progress Notes (Signed)
Subjective: Daniel [name] is a 61 y.o. male patient seen in office for f/u evaluation of ulceration of the right 1st toe, had Epifix graft #1 placed last week. Patient reports that he has able to change the outer dressings with no issues however thinks he may have gotten the dressing wet this morning in the shower.  Patient has no other pedal complaints at this time.  Patient Active Problem List   Diagnosis Date Noted  . Diabetic foot infection ([place]) 06/28/2016  . Screening examination for venereal disease 02/11/2016  . Critical lower limb ischemia 12/07/2015  . Fall 12/05/2015  . Toe ulcer, right ([place]) 09/19/2015  . Decreased pedal pulses 09/19/2015  . OSA on CPAP 09/05/2015  . Major depressive disorder, recurrent episode, moderate ([place]) 09/05/2015  . OSA (obstructive sleep apnea) 07/25/2015  . Passed out 06/28/2015  . Low back pain 06/11/2015  . Abnormality of gait 06/11/2015  . Dizziness 03/17/2015  . Weakness 02/21/2015  . Chronic renal insufficiency, stage III (moderate) 08/09/2014  . Diabetes ([place]) 11/07/2013  . Hematuria 06/21/2013  . Hepatic steatosis 09/09/2010  . UTI 09/15/2006  . PYURIA 09/03/2006  . Human immunodeficiency virus (HIV) disease ([place]) 06/04/2006  . HERPES ZOSTER, UNCOMPLICATED 94/70/9628  . HSV 06/04/2006  . Depression 06/04/2006  . DISORDER, ATTENTION DEFICIT W/HYPERACTIVITY 06/04/2006  . THROMBOPHLEBITIS NOS 06/04/2006  . GERD 06/04/2006  . ARTHRITIS, HAND 06/04/2006  . HYPERGLYCEMIA, HX OF 06/04/2006  . HEPATITIS B, HX OF 06/04/2006   Current Outpatient Prescriptions on File Prior to Visit  Medication Sig Dispense Refill  . acetaminophen (TYLENOL) 500 MG tablet Take 1,000 mg by mouth every 8 (eight) hours as needed for moderate pain.     [name] ALPRAZolam (XANAX) 1 MG tablet Take 1 mg by mouth 3 (three) times daily as needed for anxiety.     [name] amphetamine-dextroamphetamine (ADDERALL) 30 MG tablet Take 30 mg by mouth 3 (three) times daily.     [name]  atorvastatin (LIPITOR) 10 MG tablet TAKE ONE TABLET BY MOUTH DAILY. 90 tablet 0  . diclofenac sodium (VOLTAREN) 1 % GEL APPLY 2 GRAMS TO EACH KNEE IN THE MORNING AND AT BEDTIME AND APPLY 1 GRAM TO EACH KNEE IN THE AFTERNOON. 300 g 0  . divalproex (DEPAKOTE ER) 500 MG 24 hr tablet Take 1 tablet (500 mg total) by mouth at bedtime. 90 tablet 4  . insulin aspart (NOVOLOG FLEXPEN) 100 UNIT/ML FlexPen 3 times a day (just before each meal) 06-28-13 units (Patient taking differently: Inject 10-15 Units into the skin 3 (three) times daily with meals. 3 times a day (just before each meal) 12-03-13 units) 15 mL 11  . Insulin Glargine (LANTUS SOLOSTAR) 100 UNIT/ML Solostar Pen Inject 65 Units into the skin every morning. 10 pen PRN  . Insulin Pen Needle (PEN NEEDLES 3/16") 31G X 5 MM MISC 1 Device by Other route 4 (four) times daily. Use to inject Lantus daily. 120 each 11  . levothyroxine (SYNTHROID, LEVOTHROID) 50 MCG tablet Take 1 tablet (50 mcg total) by mouth at bedtime. 30 tablet 11  . LYRICA 75 MG capsule TAKE ONE CAPSULE BY MOUTH THREE TIMES DAILY. 90 capsule 5  . ondansetron (ZOFRAN-ODT) 4 MG disintegrating tablet Take 2 tablets (8 mg total) by mouth every 8 (eight) hours as needed for nausea or vomiting. 60 tablet 5  . protriptyline (VIVACTIL) 10 MG tablet Take 10 mg by mouth 3 (three) times daily.   11  . ranitidine (ZANTAC) 150 MG tablet  Take 150 mg by mouth daily.    . rivaroxaban (XARELTO) 15 MG TABS tablet Take 1 tablet (15 mg total) by mouth daily. 30 tablet 11  . TRINTELLIX 20 MG TABS Take 20 mg by mouth at bedtime.    . TRIUMEQ 600-50-300 MG tablet TAKE 1 TABLET BY MOUTH DAILY 30 tablet 6  . UNABLE TO FIND CPAP MACHINE with standard [employer] nasal mask with humidifier. Set at 14 cwp 1 each 0  . zolpidem (AMBIEN) 10 MG tablet Take 10-20 mg by mouth at bedtime as needed for sleep.      No current facility-administered medications on file prior to visit.    Allergies  Allergen Reactions  .  Aspirin Swelling  . Ibuprofen Swelling  . Sustiva [Efavirenz] Swelling and Rash    And rash.  . Nsaids Other (See Comments)    unknwn    Recent Results (from the past 2160 hour(s))  POCT glycosylated hemoglobin (Hb A1C)     Status: None   Collection Time: 04/25/16  3:41 PM  Result Value Ref Range   Hemoglobin A1C 7.7   Comprehensive metabolic panel     Status: Abnormal   Collection Time: 06/05/16  5:28 PM  Result Value Ref Range   Glucose 190 (H) 65 - 99 mg/dL   BUN 24 8 - 27 mg/dL   Creatinine, Ser 1.67 (H) 0.76 - 1.27 mg/dL   GFR calc non Af Amer 43 (L) >59 mL/min/1.73   GFR calc Af Amer 50 (L) >59 mL/min/1.73   BUN/Creatinine Ratio 14 10 - 24   Sodium 142 134 - 144 mmol/L   Potassium 4.4 3.5 - 5.2 mmol/L   Chloride 102 96 - 106 mmol/L   CO2 25 18 - 29 mmol/L   Calcium 9.4 8.6 - 10.2 mg/dL   Total Protein 7.1 6.0 - 8.5 g/dL   Albumin 4.2 3.6 - 4.8 g/dL   Globulin, Total 2.9 1.5 - 4.5 g/dL   Albumin/Globulin Ratio 1.4 1.2 - 2.2   Bilirubin Total 0.6 0.0 - 1.2 mg/dL   Alkaline Phosphatase 122 (H) 39 - 117 IU/L   AST 19 0 - 40 IU/L   ALT 28 0 - 44 IU/L  Microalbumin, urine     Status: None   Collection Time: 06/05/16  5:28 PM  Result Value Ref Range   Albumin, Urine 92.2 Not Estab. ug/mL  Lipid panel     Status: Abnormal   Collection Time: 06/05/16  5:28 PM  Result Value Ref Range   Cholesterol, Total 164 100 - 199 mg/dL   Triglycerides 356 (H) 0 - 149 mg/dL   HDL 37 (L) >39 mg/dL   VLDL Cholesterol Cal 71 (H) 5 - 40 mg/dL   LDL Calculated 56 0 - 99 mg/dL   Chol/HDL Ratio 4.4 0.0 - 5.0 ratio    Comment:                                   T. Chol/HDL Ratio                                             Men  Women  1/2 Avg.Risk  3.4    3.3                                   Avg.Risk  5.0    4.4                                2X Avg.Risk  9.6    7.1                                3X Avg.Risk 23.4   11.0   Hemoglobin A1c     Status: Abnormal    Collection Time: 06/28/16 12:00 AM  Result Value Ref Range   Hgb A1c MFr Bld 8.7 (H) 4.8 - 5.6 %    Comment: (NOTE)         Pre-diabetes: 5.7 - 6.4         Diabetes: >6.4         Glycemic control for adults with diabetes: <7.0    Mean Plasma Glucose 203 mg/dL    Comment: (NOTE) Performed At: [facility] [place], [place] [id number] Lindon Romp MD UU:[id number]   Comprehensive metabolic panel     Status: Abnormal   Collection Time: 06/28/16 12:10 AM  Result Value Ref Range   Sodium 134 (L) 135 - 145 mmol/L   Potassium 3.9 3.5 - 5.1 mmol/L   Chloride 102 101 - 111 mmol/L   CO2 24 22 - 32 mmol/L   Glucose, Bld 265 (H) 65 - 99 mg/dL   BUN 17 6 - 20 mg/dL   Creatinine, Ser 1.46 (H) 0.61 - 1.24 mg/dL   Calcium 9.3 8.9 - 10.3 mg/dL   Total Protein 7.5 6.5 - 8.1 g/dL   Albumin 3.6 3.5 - 5.0 g/dL   AST 20 15 - 41 U/L   ALT 25 17 - 63 U/L   Alkaline Phosphatase 107 38 - 126 U/L   Total Bilirubin 0.9 0.3 - 1.2 mg/dL   GFR calc non Af Amer 50 (L) >60 mL/min   GFR calc Af Amer 58 (L) >60 mL/min    Comment: (NOTE) The eGFR has been calculated using the CKD EPI equation. This calculation has not been validated in all clinical situations. eGFR's persistently <60 mL/min signify possible Chronic Kidney Disease.    Anion gap 8 5 - 15  CBC WITH DIFFERENTIAL     Status: Abnormal   Collection Time: 06/28/16 12:10 AM  Result Value Ref Range   WBC 7.4 4.0 - 10.5 K/uL   RBC 4.87 4.22 - 5.81 MIL/uL   Hemoglobin 16.5 13.0 - 17.0 g/dL   HCT 45.7 39.0 - 52.0 %   MCV 93.8 78.0 - 100.0 fL   MCH 33.9 26.0 - 34.0 pg   MCHC 36.1 (H) 30.0 - 36.0 g/dL   RDW 14.0 11.5 - 15.5 %   Platelets 119 (L) 150 - 400 K/uL   Neutrophils Relative % 56 %   Neutro Abs 4.1 1.7 - 7.7 K/uL   Lymphocytes Relative 28 %   Lymphs Abs 2.1 0.7 - 4.0 K/uL   Monocytes Relative 14 %   Monocytes Absolute 1.0 0.1 - 1.0 K/uL   Eosinophils Relative 1 %   Eosinophils Absolute 0.1 0.0 - 0.7 K/uL    Basophils Relative  0 %   Basophils Absolute 0.0 0.0 - 0.1 K/uL  I-Stat CG4 Lactic Acid, ED  (not at  [facility])     Status: None   Collection Time: 06/28/16 12:21 AM  Result Value Ref Range   Lactic Acid, Venous 1.47 0.5 - 1.9 mmol/L  Blood Culture (routine x 2)     Status: None   Collection Time: 06/28/16  1:20 AM  Result Value Ref Range   Specimen Description RIGHT ANTECUBITAL    Special Requests      BOTTLES DRAWN AEROBIC AND ANAEROBIC Blood Culture adequate volume   Culture NO GROWTH 5 DAYS    Report Status 07/03/2016 FINAL   Blood Culture (routine x 2)     Status: None   Collection Time: 06/28/16  1:28 AM  Result Value Ref Range   Specimen Description BLOOD RIGHT HAND    Special Requests      BOTTLES DRAWN AEROBIC AND ANAEROBIC Blood Culture results may not be optimal due to an inadequate volume of blood received in culture bottles   Culture NO GROWTH 5 DAYS    Report Status 07/03/2016 FINAL   CBC     Status: Abnormal   Collection Time: 06/28/16  7:02 AM  Result Value Ref Range   WBC 5.9 4.0 - 10.5 K/uL   RBC 4.65 4.22 - 5.81 MIL/uL   Hemoglobin 15.6 13.0 - 17.0 g/dL   HCT 43.8 39.0 - 52.0 %   MCV 94.2 78.0 - 100.0 fL   MCH 33.5 26.0 - 34.0 pg   MCHC 35.6 30.0 - 36.0 g/dL   RDW 14.0 11.5 - 15.5 %   Platelets 111 (L) 150 - 400 K/uL    Comment: SPECIMEN CHECKED FOR CLOTS PLATELET COUNT CONFIRMED BY SMEAR   Comprehensive metabolic panel     Status: Abnormal   Collection Time: 06/28/16  7:02 AM  Result Value Ref Range   Sodium 135 135 - 145 mmol/L   Potassium 3.5 3.5 - 5.1 mmol/L   Chloride 101 101 - 111 mmol/L   CO2 27 22 - 32 mmol/L   Glucose, Bld 245 (H) 65 - 99 mg/dL   BUN 16 6 - 20 mg/dL   Creatinine, Ser 1.34 (H) 0.61 - 1.24 mg/dL   Calcium 8.4 (L) 8.9 - 10.3 mg/dL   Total Protein 6.8 6.5 - 8.1 g/dL   Albumin 3.2 (L) 3.5 - 5.0 g/dL   AST 15 15 - 41 U/L   ALT 21 17 - 63 U/L   Alkaline Phosphatase 95 38 - 126 U/L   Total Bilirubin 1.1 0.3 - 1.2 mg/dL   GFR  calc non Af Amer 55 (L) >60 mL/min   GFR calc Af Amer >60 >60 mL/min    Comment: (NOTE) The eGFR has been calculated using the CKD EPI equation. This calculation has not been validated in all clinical situations. eGFR's persistently <60 mL/min signify possible Chronic Kidney Disease.    Anion gap 7 5 - 15  Glucose, capillary     Status: Abnormal   Collection Time: 06/28/16  8:05 AM  Result Value Ref Range   Glucose-Capillary 239 (H) 65 - 99 mg/dL   Comment 1 Notify RN   Glucose, capillary     Status: Abnormal   Collection Time: 06/28/16 12:12 PM  Result Value Ref Range   Glucose-Capillary 248 (H) 65 - 99 mg/dL   Comment 1 Notify RN   Urinalysis, Routine w reflex microscopic     Status: Abnormal  Collection Time: 06/28/16  2:23 PM  Result Value Ref Range   Color, Urine YELLOW YELLOW   APPearance HAZY (A) CLEAR   Specific Gravity, Urine 1.022 1.005 - 1.030   pH 7.0 5.0 - 8.0   Glucose, UA >=500 (A) NEGATIVE mg/dL   Hgb urine dipstick SMALL (A) NEGATIVE   Bilirubin Urine NEGATIVE NEGATIVE   Ketones, ur NEGATIVE NEGATIVE mg/dL   Protein, ur 30 (A) NEGATIVE mg/dL   Nitrite NEGATIVE NEGATIVE   Leukocytes, UA NEGATIVE NEGATIVE   RBC / HPF 0-5 0 - 5 RBC/hpf   WBC, UA 0-5 0 - 5 WBC/hpf   Bacteria, UA NONE SEEN NONE SEEN   Squamous Epithelial / LPF 0-5 (A) NONE SEEN  Glucose, capillary     Status: Abnormal   Collection Time: 06/28/16  4:14 PM  Result Value Ref Range   Glucose-Capillary 298 (H) 65 - 99 mg/dL   Comment 1 Notify RN   Glucose, capillary     Status: Abnormal   Collection Time: 06/28/16  9:13 PM  Result Value Ref Range   Glucose-Capillary 191 (H) 65 - 99 mg/dL   Comment 1 Notify RN    Comment 2 Document in Chart   Glucose, capillary     Status: Abnormal   Collection Time: 06/29/16  8:06 AM  Result Value Ref Range   Glucose-Capillary 153 (H) 65 - 99 mg/dL   Comment 1 Notify RN   Glucose, capillary     Status: Abnormal   Collection Time: 06/29/16 12:07 PM   Result Value Ref Range   Glucose-Capillary 253 (H) 65 - 99 mg/dL   Comment 1 Notify RN     Objective: There were no vitals filed for this visit.  General: Patient is awake, alert, oriented x 3 and in no acute distress but with somber mood.  Dermatology: Skin is warm and dry bilateral with a full thickness ulceration present Plantar right hallux,  measures 1.0x0.6x0.2cm with granular base (slightly smaller than previous) with maceration and periwound callus, the ulceration does not probe to bone. There is no malodor, no active drainage, no erythema, decreased focal edema. No other acute signs of infection. No other open lesions.    Vascular: Dorsalis Pedis pulse = +4/4 on right, left 0/4,  Posterior Tibial pulse = 0/4 Bilateral,  Capillary Fill Time < 5 seconds. 1+ pitting edema and mild varicosities.  Neurologic: Protective sensation severely diminished to the level of the ankles with the 5.07/10g [employer].  Musculosketal: No Pain with palpation to ulcerated area at right 1st toe. No pain with compression to calves bilateral.  Assessment and Plan:  Problem List Items Addressed This Visit    None    Visit Diagnoses    Right foot ulcer, with fat layer exposed ([place])    -  Primary   Diabetic neuropathy with neurologic complication ([place])       Diminished pulses in lower extremity          -Examined patient and discussed the progression of the wounds and treatment alternatives. -Excisionally debrided ulcer at right hallux to healthy bleeding, Cleansed and applied Epifix 24 mm disk, reorder number [id number], [place] [phone], expires 09/25/2018, this is graft #2 placed to site, the entire graft was used with no waste and secured with Adaptic and Steri-Strips and dressed with saline moistened gauze, 2 x 2's and [place]. Advised patient to keep dressing clean, dry and intact. If he does need to change the dressing, then recommend only changing the  outer dressing layers as  instructed. -Advised patient to continue with follow up with PCP for more recs on controlling blood sugars   -Advised patient to go to the ER if worsens -Continue with postoperative shoe and encouraged limited activity to assist with healing  -Patient to return to office in 1 week for possible reapplication of Epifix on right 1st toe or sooner if problems arise.  Landis Martins, DPM

## 2016-07-22 ENCOUNTER — Ambulatory Visit (INDEPENDENT_AMBULATORY_CARE_PROVIDER_SITE_OTHER): Payer: BLUE CROSS/BLUE SHIELD | Admitting: Sports Medicine

## 2016-07-22 ENCOUNTER — Encounter: Payer: Self-pay | Admitting: Sports Medicine

## 2016-07-22 DIAGNOSIS — E1149 Type 2 diabetes mellitus with other diabetic neurological complication: Secondary | ICD-10-CM

## 2016-07-22 DIAGNOSIS — E1140 Type 2 diabetes mellitus with diabetic neuropathy, unspecified: Secondary | ICD-10-CM | POA: Diagnosis not present

## 2016-07-22 DIAGNOSIS — R0989 Other specified symptoms and signs involving the circulatory and respiratory systems: Secondary | ICD-10-CM

## 2016-07-22 DIAGNOSIS — L97512 Non-pressure chronic ulcer of other part of right foot with fat layer exposed: Secondary | ICD-10-CM | POA: Diagnosis not present

## 2016-07-22 NOTE — Progress Notes (Signed)
Subjective: Daniel [name] is a 61 y.o. male patient seen in office for f/u evaluation of ulceration of the right 1st toe, had Epifix graft #2 placed last week. Patient reports that he has changed the outer dressings with no issues however has done a lot of walking and standing with trying to move things out of mom's attic. Patient has no other pedal complaints at this time.  Patient Active Problem List   Diagnosis Date Noted  . Diabetic foot infection ([place]) 06/28/2016  . Screening examination for venereal disease 02/11/2016  . Critical lower limb ischemia 12/07/2015  . Fall 12/05/2015  . Toe ulcer, right ([place]) 09/19/2015  . Decreased pedal pulses 09/19/2015  . OSA on CPAP 09/05/2015  . Major depressive disorder, recurrent episode, moderate ([place]) 09/05/2015  . OSA (obstructive sleep apnea) 07/25/2015  . Passed out 06/28/2015  . Low back pain 06/11/2015  . Abnormality of gait 06/11/2015  . Dizziness 03/17/2015  . Weakness 02/21/2015  . Chronic renal insufficiency, stage III (moderate) 08/09/2014  . Diabetes ([place]) 11/07/2013  . Hematuria 06/21/2013  . Hepatic steatosis 09/09/2010  . UTI 09/15/2006  . PYURIA 09/03/2006  . Human immunodeficiency virus (HIV) disease ([place]) 06/04/2006  . HERPES ZOSTER, UNCOMPLICATED 37/05/8887  . HSV 06/04/2006  . Depression 06/04/2006  . DISORDER, ATTENTION DEFICIT W/HYPERACTIVITY 06/04/2006  . THROMBOPHLEBITIS NOS 06/04/2006  . GERD 06/04/2006  . ARTHRITIS, HAND 06/04/2006  . HYPERGLYCEMIA, HX OF 06/04/2006  . HEPATITIS B, HX OF 06/04/2006   Current Outpatient Prescriptions on File Prior to Visit  Medication Sig Dispense Refill  . acetaminophen (TYLENOL) 500 MG tablet Take 1,000 mg by mouth every 8 (eight) hours as needed for moderate pain.     [name] ALPRAZolam (XANAX) 1 MG tablet Take 1 mg by mouth 3 (three) times daily as needed for anxiety.     [name] amphetamine-dextroamphetamine (ADDERALL) 30 MG tablet Take 30 mg by mouth 3 (three) times daily.      [name] atorvastatin (LIPITOR) 10 MG tablet TAKE ONE TABLET BY MOUTH DAILY. 90 tablet 0  . diclofenac sodium (VOLTAREN) 1 % GEL APPLY 2 GRAMS TO EACH KNEE IN THE MORNING AND AT BEDTIME AND APPLY 1 GRAM TO EACH KNEE IN THE AFTERNOON. 300 g 0  . divalproex (DEPAKOTE ER) 500 MG 24 hr tablet Take 1 tablet (500 mg total) by mouth at bedtime. 90 tablet 4  . insulin aspart (NOVOLOG FLEXPEN) 100 UNIT/ML FlexPen 3 times a day (just before each meal) 06-28-13 units (Patient taking differently: Inject 10-15 Units into the skin 3 (three) times daily with meals. 3 times a day (just before each meal) 12-03-13 units) 15 mL 11  . Insulin Glargine (LANTUS SOLOSTAR) 100 UNIT/ML Solostar Pen Inject 65 Units into the skin every morning. 10 pen PRN  . Insulin Pen Needle (PEN NEEDLES 3/16") 31G X 5 MM MISC 1 Device by Other route 4 (four) times daily. Use to inject Lantus daily. 120 each 11  . levothyroxine (SYNTHROID, LEVOTHROID) 50 MCG tablet Take 1 tablet (50 mcg total) by mouth at bedtime. 30 tablet 11  . LYRICA 75 MG capsule TAKE ONE CAPSULE BY MOUTH THREE TIMES DAILY. 90 capsule 5  . ondansetron (ZOFRAN-ODT) 4 MG disintegrating tablet Take 2 tablets (8 mg total) by mouth every 8 (eight) hours as needed for nausea or vomiting. 60 tablet 5  . protriptyline (VIVACTIL) 10 MG tablet Take 10 mg by mouth 3 (three) times daily.   11  . ranitidine (ZANTAC) 150 MG  tablet Take 150 mg by mouth daily.    . rivaroxaban (XARELTO) 15 MG TABS tablet Take 1 tablet (15 mg total) by mouth daily. 30 tablet 11  . TRINTELLIX 20 MG TABS Take 20 mg by mouth at bedtime.    . TRIUMEQ 600-50-300 MG tablet TAKE 1 TABLET BY MOUTH DAILY 30 tablet 6  . UNABLE TO FIND CPAP MACHINE with standard [employer] nasal mask with humidifier. Set at 14 cwp 1 each 0  . zolpidem (AMBIEN) 10 MG tablet Take 10-20 mg by mouth at bedtime as needed for sleep.      No current facility-administered medications on file prior to visit.    Allergies  Allergen Reactions  .  Aspirin Swelling  . Ibuprofen Swelling  . Sustiva [Efavirenz] Swelling and Rash    And rash.  . Nsaids Other (See Comments)    unknwn    Recent Results (from the past 2160 hour(s))  POCT glycosylated hemoglobin (Hb A1C)     Status: None   Collection Time: 04/25/16  3:41 PM  Result Value Ref Range   Hemoglobin A1C 7.7   Comprehensive metabolic panel     Status: Abnormal   Collection Time: 06/05/16  5:28 PM  Result Value Ref Range   Glucose 190 (H) 65 - 99 mg/dL   BUN 24 8 - 27 mg/dL   Creatinine, Ser 1.67 (H) 0.76 - 1.27 mg/dL   GFR calc non Af Amer 43 (L) >59 mL/min/1.73   GFR calc Af Amer 50 (L) >59 mL/min/1.73   BUN/Creatinine Ratio 14 10 - 24   Sodium 142 134 - 144 mmol/L   Potassium 4.4 3.5 - 5.2 mmol/L   Chloride 102 96 - 106 mmol/L   CO2 25 18 - 29 mmol/L   Calcium 9.4 8.6 - 10.2 mg/dL   Total Protein 7.1 6.0 - 8.5 g/dL   Albumin 4.2 3.6 - 4.8 g/dL   Globulin, Total 2.9 1.5 - 4.5 g/dL   Albumin/Globulin Ratio 1.4 1.2 - 2.2   Bilirubin Total 0.6 0.0 - 1.2 mg/dL   Alkaline Phosphatase 122 (H) 39 - 117 IU/L   AST 19 0 - 40 IU/L   ALT 28 0 - 44 IU/L  Microalbumin, urine     Status: None   Collection Time: 06/05/16  5:28 PM  Result Value Ref Range   Albumin, Urine 92.2 Not Estab. ug/mL  Lipid panel     Status: Abnormal   Collection Time: 06/05/16  5:28 PM  Result Value Ref Range   Cholesterol, Total 164 100 - 199 mg/dL   Triglycerides 356 (H) 0 - 149 mg/dL   HDL 37 (L) >39 mg/dL   VLDL Cholesterol Cal 71 (H) 5 - 40 mg/dL   LDL Calculated 56 0 - 99 mg/dL   Chol/HDL Ratio 4.4 0.0 - 5.0 ratio    Comment:                                   T. Chol/HDL Ratio                                             Men  Women  1/2 Avg.Risk  3.4    3.3                                   Avg.Risk  5.0    4.4                                2X Avg.Risk  9.6    7.1                                3X Avg.Risk 23.4   11.0   Hemoglobin A1c     Status: Abnormal    Collection Time: 06/28/16 12:00 AM  Result Value Ref Range   Hgb A1c MFr Bld 8.7 (H) 4.8 - 5.6 %    Comment: (NOTE)         Pre-diabetes: 5.7 - 6.4         Diabetes: >6.4         Glycemic control for adults with diabetes: <7.0    Mean Plasma Glucose 203 mg/dL    Comment: (NOTE) Performed At: [facility] [place], [place] [id number] Lindon Romp MD UU:[id number]   Comprehensive metabolic panel     Status: Abnormal   Collection Time: 06/28/16 12:10 AM  Result Value Ref Range   Sodium 134 (L) 135 - 145 mmol/L   Potassium 3.9 3.5 - 5.1 mmol/L   Chloride 102 101 - 111 mmol/L   CO2 24 22 - 32 mmol/L   Glucose, Bld 265 (H) 65 - 99 mg/dL   BUN 17 6 - 20 mg/dL   Creatinine, Ser 1.46 (H) 0.61 - 1.24 mg/dL   Calcium 9.3 8.9 - 10.3 mg/dL   Total Protein 7.5 6.5 - 8.1 g/dL   Albumin 3.6 3.5 - 5.0 g/dL   AST 20 15 - 41 U/L   ALT 25 17 - 63 U/L   Alkaline Phosphatase 107 38 - 126 U/L   Total Bilirubin 0.9 0.3 - 1.2 mg/dL   GFR calc non Af Amer 50 (L) >60 mL/min   GFR calc Af Amer 58 (L) >60 mL/min    Comment: (NOTE) The eGFR has been calculated using the CKD EPI equation. This calculation has not been validated in all clinical situations. eGFR's persistently <60 mL/min signify possible Chronic Kidney Disease.    Anion gap 8 5 - 15  CBC WITH DIFFERENTIAL     Status: Abnormal   Collection Time: 06/28/16 12:10 AM  Result Value Ref Range   WBC 7.4 4.0 - 10.5 K/uL   RBC 4.87 4.22 - 5.81 MIL/uL   Hemoglobin 16.5 13.0 - 17.0 g/dL   HCT 45.7 39.0 - 52.0 %   MCV 93.8 78.0 - 100.0 fL   MCH 33.9 26.0 - 34.0 pg   MCHC 36.1 (H) 30.0 - 36.0 g/dL   RDW 14.0 11.5 - 15.5 %   Platelets 119 (L) 150 - 400 K/uL   Neutrophils Relative % 56 %   Neutro Abs 4.1 1.7 - 7.7 K/uL   Lymphocytes Relative 28 %   Lymphs Abs 2.1 0.7 - 4.0 K/uL   Monocytes Relative 14 %   Monocytes Absolute 1.0 0.1 - 1.0 K/uL   Eosinophils Relative 1 %   Eosinophils Absolute 0.1 0.0 - 0.7 K/uL    Basophils Relative  0 %   Basophils Absolute 0.0 0.0 - 0.1 K/uL  I-Stat CG4 Lactic Acid, ED  (not at  [facility])     Status: None   Collection Time: 06/28/16 12:21 AM  Result Value Ref Range   Lactic Acid, Venous 1.47 0.5 - 1.9 mmol/L  Blood Culture (routine x 2)     Status: None   Collection Time: 06/28/16  1:20 AM  Result Value Ref Range   Specimen Description RIGHT ANTECUBITAL    Special Requests      BOTTLES DRAWN AEROBIC AND ANAEROBIC Blood Culture adequate volume   Culture NO GROWTH 5 DAYS    Report Status 07/03/2016 FINAL   Blood Culture (routine x 2)     Status: None   Collection Time: 06/28/16  1:28 AM  Result Value Ref Range   Specimen Description BLOOD RIGHT HAND    Special Requests      BOTTLES DRAWN AEROBIC AND ANAEROBIC Blood Culture results may not be optimal due to an inadequate volume of blood received in culture bottles   Culture NO GROWTH 5 DAYS    Report Status 07/03/2016 FINAL   CBC     Status: Abnormal   Collection Time: 06/28/16  7:02 AM  Result Value Ref Range   WBC 5.9 4.0 - 10.5 K/uL   RBC 4.65 4.22 - 5.81 MIL/uL   Hemoglobin 15.6 13.0 - 17.0 g/dL   HCT 43.8 39.0 - 52.0 %   MCV 94.2 78.0 - 100.0 fL   MCH 33.5 26.0 - 34.0 pg   MCHC 35.6 30.0 - 36.0 g/dL   RDW 14.0 11.5 - 15.5 %   Platelets 111 (L) 150 - 400 K/uL    Comment: SPECIMEN CHECKED FOR CLOTS PLATELET COUNT CONFIRMED BY SMEAR   Comprehensive metabolic panel     Status: Abnormal   Collection Time: 06/28/16  7:02 AM  Result Value Ref Range   Sodium 135 135 - 145 mmol/L   Potassium 3.5 3.5 - 5.1 mmol/L   Chloride 101 101 - 111 mmol/L   CO2 27 22 - 32 mmol/L   Glucose, Bld 245 (H) 65 - 99 mg/dL   BUN 16 6 - 20 mg/dL   Creatinine, Ser 1.34 (H) 0.61 - 1.24 mg/dL   Calcium 8.4 (L) 8.9 - 10.3 mg/dL   Total Protein 6.8 6.5 - 8.1 g/dL   Albumin 3.2 (L) 3.5 - 5.0 g/dL   AST 15 15 - 41 U/L   ALT 21 17 - 63 U/L   Alkaline Phosphatase 95 38 - 126 U/L   Total Bilirubin 1.1 0.3 - 1.2 mg/dL   GFR  calc non Af Amer 55 (L) >60 mL/min   GFR calc Af Amer >60 >60 mL/min    Comment: (NOTE) The eGFR has been calculated using the CKD EPI equation. This calculation has not been validated in all clinical situations. eGFR's persistently <60 mL/min signify possible Chronic Kidney Disease.    Anion gap 7 5 - 15  Glucose, capillary     Status: Abnormal   Collection Time: 06/28/16  8:05 AM  Result Value Ref Range   Glucose-Capillary 239 (H) 65 - 99 mg/dL   Comment 1 Notify RN   Glucose, capillary     Status: Abnormal   Collection Time: 06/28/16 12:12 PM  Result Value Ref Range   Glucose-Capillary 248 (H) 65 - 99 mg/dL   Comment 1 Notify RN   Urinalysis, Routine w reflex microscopic     Status: Abnormal  Collection Time: 06/28/16  2:23 PM  Result Value Ref Range   Color, Urine YELLOW YELLOW   APPearance HAZY (A) CLEAR   Specific Gravity, Urine 1.022 1.005 - 1.030   pH 7.0 5.0 - 8.0   Glucose, UA >=500 (A) NEGATIVE mg/dL   Hgb urine dipstick SMALL (A) NEGATIVE   Bilirubin Urine NEGATIVE NEGATIVE   Ketones, ur NEGATIVE NEGATIVE mg/dL   Protein, ur 30 (A) NEGATIVE mg/dL   Nitrite NEGATIVE NEGATIVE   Leukocytes, UA NEGATIVE NEGATIVE   RBC / HPF 0-5 0 - 5 RBC/hpf   WBC, UA 0-5 0 - 5 WBC/hpf   Bacteria, UA NONE SEEN NONE SEEN   Squamous Epithelial / LPF 0-5 (A) NONE SEEN  Glucose, capillary     Status: Abnormal   Collection Time: 06/28/16  4:14 PM  Result Value Ref Range   Glucose-Capillary 298 (H) 65 - 99 mg/dL   Comment 1 Notify RN   Glucose, capillary     Status: Abnormal   Collection Time: 06/28/16  9:13 PM  Result Value Ref Range   Glucose-Capillary 191 (H) 65 - 99 mg/dL   Comment 1 Notify RN    Comment 2 Document in Chart   Glucose, capillary     Status: Abnormal   Collection Time: 06/29/16  8:06 AM  Result Value Ref Range   Glucose-Capillary 153 (H) 65 - 99 mg/dL   Comment 1 Notify RN   Glucose, capillary     Status: Abnormal   Collection Time: 06/29/16 12:07 PM   Result Value Ref Range   Glucose-Capillary 253 (H) 65 - 99 mg/dL   Comment 1 Notify RN     Objective: There were no vitals filed for this visit.  General: Patient is awake, alert, oriented x 3 and in no acute distress but with somber mood.  Dermatology: Skin is warm and dry bilateral with a full thickness ulceration present Plantar right hallux,  measures 1.0x1.2x0.2cm with granular base (larger than last week) with maceration, the ulceration does not probe to bone. There is no malodor, no active drainage, no erythema, decreased focal edema. No other acute signs of infection. No other open lesions.    Vascular: Dorsalis Pedis pulse = +4/4 on right, left 0/4,  Posterior Tibial pulse = 0/4 Bilateral,  Capillary Fill Time < 5 seconds. 1+ pitting edema and mild varicosities.  Neurologic: Protective sensation severely diminished to the level of the ankles with the 5.07/10g [employer].  Musculosketal: No Pain with palpation to ulcerated area at right 1st toe. No pain with compression to calves bilateral.  Assessment and Plan:  Problem List Items Addressed This Visit    None    Visit Diagnoses    Right foot ulcer, with fat layer exposed ([place])    -  Primary   Diabetic neuropathy with neurologic complication ([place])       Diminished pulses in lower extremity          -Examined patient and discussed the progression of the wounds and treatment alternatives. -Excisionally debrided ulcer at right hallux to healthy bleeding, Cleansed and applied Epifix 24 mm disk, reorder number [id number], GS [id number], expires 02/24/2021, this is graft #3 placed to site, the entire graft was used with no waste and secured with Adaptic and Steri-Strips and dressed with saline moistened gauze, 2 x 2's and [place]. Advised patient to keep dressing clean, dry and intact. If he does need to change the dressing, then recommend only changing the outer dressing layers  as instructed. -Advised patient to  continue with follow up with PCP for more recs on controlling blood sugars   -Advised patient to go to the ER if worsens -Continue with postoperative shoe and encouraged limited activity to assist with healing  -Patient to return to office in 1 week for possible reapplication of Epifix on right 1st toe or sooner if problems arise.  Landis Martins, DPM

## 2016-07-28 ENCOUNTER — Encounter: Payer: Self-pay | Admitting: Family Medicine

## 2016-07-28 ENCOUNTER — Other Ambulatory Visit: Payer: BLUE CROSS/BLUE SHIELD

## 2016-07-28 ENCOUNTER — Ambulatory Visit: Payer: BLUE CROSS/BLUE SHIELD | Admitting: Endocrinology

## 2016-07-28 DIAGNOSIS — Z0289 Encounter for other administrative examinations: Secondary | ICD-10-CM

## 2016-07-29 ENCOUNTER — Ambulatory Visit (INDEPENDENT_AMBULATORY_CARE_PROVIDER_SITE_OTHER): Payer: BLUE CROSS/BLUE SHIELD | Admitting: Sports Medicine

## 2016-07-29 DIAGNOSIS — E1140 Type 2 diabetes mellitus with diabetic neuropathy, unspecified: Secondary | ICD-10-CM | POA: Diagnosis not present

## 2016-07-29 DIAGNOSIS — L97512 Non-pressure chronic ulcer of other part of right foot with fat layer exposed: Secondary | ICD-10-CM

## 2016-07-29 DIAGNOSIS — I739 Peripheral vascular disease, unspecified: Secondary | ICD-10-CM | POA: Diagnosis not present

## 2016-07-29 DIAGNOSIS — E1149 Type 2 diabetes mellitus with other diabetic neurological complication: Secondary | ICD-10-CM

## 2016-07-29 MED ORDER — LIDOCAINE 5 % EX PTCH: 1.0000 | MEDICATED_PATCH | CUTANEOUS | 0 refills | Status: DC

## 2016-07-29 MED ORDER — SULFAMETHOXAZOLE-TRIMETHOPRIM 800-160 MG PO TABS: 1.0000 | ORAL_TABLET | Freq: Two times a day (BID) | ORAL | 0 refills | Status: DC

## 2016-07-29 MED ORDER — LIDOCAINE 5 % EX PTCH: 1.0000 | MEDICATED_PATCH | CUTANEOUS | 11 refills | Status: DC

## 2016-07-29 NOTE — Progress Notes (Signed)
Subjective: Daniel [name] is a 61 y.o. male patient seen in office for f/u evaluation of ulceration of the right 1st toe, had Epifix graft #3 placed last week. Patient reports that he has changed the outer dressings with no issues however has done a lot of walking and got the dressing wet twice. Patient has no other pedal complaints at this time.  Reports burning with urination concerning for UTI and hip pain on left.  Patient Active Problem List   Diagnosis Date Noted  . Diabetic foot infection ([place]) 06/28/2016  . Screening examination for venereal disease 02/11/2016  . Critical lower limb ischemia 12/07/2015  . Fall 12/05/2015  . Toe ulcer, right ([place]) 09/19/2015  . Decreased pedal pulses 09/19/2015  . OSA on CPAP 09/05/2015  . Major depressive disorder, recurrent episode, moderate ([place]) 09/05/2015  . OSA (obstructive sleep apnea) 07/25/2015  . Passed out 06/28/2015  . Low back pain 06/11/2015  . Abnormality of gait 06/11/2015  . Dizziness 03/17/2015  . Weakness 02/21/2015  . Chronic renal insufficiency, stage III (moderate) 08/09/2014  . Diabetes ([place]) 11/07/2013  . Hematuria 06/21/2013  . Hepatic steatosis 09/09/2010  . UTI 09/15/2006  . PYURIA 09/03/2006  . Human immunodeficiency virus (HIV) disease ([place]) 06/04/2006  . HERPES ZOSTER, UNCOMPLICATED 62/69/4854  . HSV 06/04/2006  . Depression 06/04/2006  . DISORDER, ATTENTION DEFICIT W/HYPERACTIVITY 06/04/2006  . THROMBOPHLEBITIS NOS 06/04/2006  . GERD 06/04/2006  . ARTHRITIS, HAND 06/04/2006  . HYPERGLYCEMIA, HX OF 06/04/2006  . HEPATITIS B, HX OF 06/04/2006   Current Outpatient Prescriptions on File Prior to Visit  Medication Sig Dispense Refill  . acetaminophen (TYLENOL) 500 MG tablet Take 1,000 mg by mouth every 8 (eight) hours as needed for moderate pain.     [name] ALPRAZolam (XANAX) 1 MG tablet Take 1 mg by mouth 3 (three) times daily as needed for anxiety.     [name] amphetamine-dextroamphetamine (ADDERALL) 30 MG  tablet Take 30 mg by mouth 3 (three) times daily.     [name] atorvastatin (LIPITOR) 10 MG tablet TAKE ONE TABLET BY MOUTH DAILY. 90 tablet 0  . diclofenac sodium (VOLTAREN) 1 % GEL APPLY 2 GRAMS TO EACH KNEE IN THE MORNING AND AT BEDTIME AND APPLY 1 GRAM TO EACH KNEE IN THE AFTERNOON. 300 g 0  . divalproex (DEPAKOTE ER) 500 MG 24 hr tablet Take 1 tablet (500 mg total) by mouth at bedtime. 90 tablet 4  . insulin aspart (NOVOLOG FLEXPEN) 100 UNIT/ML FlexPen 3 times a day (just before each meal) 06-28-13 units (Patient taking differently: Inject 10-15 Units into the skin 3 (three) times daily with meals. 3 times a day (just before each meal) 12-03-13 units) 15 mL 11  . Insulin Glargine (LANTUS SOLOSTAR) 100 UNIT/ML Solostar Pen Inject 65 Units into the skin every morning. 10 pen PRN  . Insulin Pen Needle (PEN NEEDLES 3/16") 31G X 5 MM MISC 1 Device by Other route 4 (four) times daily. Use to inject Lantus daily. 120 each 11  . levothyroxine (SYNTHROID, LEVOTHROID) 50 MCG tablet Take 1 tablet (50 mcg total) by mouth at bedtime. 30 tablet 11  . LYRICA 75 MG capsule TAKE ONE CAPSULE BY MOUTH THREE TIMES DAILY. 90 capsule 5  . ondansetron (ZOFRAN-ODT) 4 MG disintegrating tablet Take 2 tablets (8 mg total) by mouth every 8 (eight) hours as needed for nausea or vomiting. 60 tablet 5  . protriptyline (VIVACTIL) 10 MG tablet Take 10 mg by mouth 3 (three) times daily.  11  . ranitidine (ZANTAC) 150 MG tablet Take 150 mg by mouth daily.    . rivaroxaban (XARELTO) 15 MG TABS tablet Take 1 tablet (15 mg total) by mouth daily. 30 tablet 11  . TRINTELLIX 20 MG TABS Take 20 mg by mouth at bedtime.    . TRIUMEQ 600-50-300 MG tablet TAKE 1 TABLET BY MOUTH DAILY 30 tablet 6  . UNABLE TO FIND CPAP MACHINE with standard Aclaim nasal mask with humidifier. Set at 14 cwp 1 each 0  . zolpidem (AMBIEN) 10 MG tablet Take 10-20 mg by mouth at bedtime as needed for sleep.      No current facility-administered medications on file prior  to visit.    Allergies  Allergen Reactions  . Aspirin Swelling  . Ibuprofen Swelling  . Sustiva [Efavirenz] Swelling and Rash    And rash.  . Nsaids Other (See Comments)    unknwn    Recent Results (from the past 2160 hour(s))  Comprehensive metabolic panel     Status: Abnormal   Collection Time: 06/05/16  5:28 PM  Result Value Ref Range   Glucose 190 (H) 65 - 99 mg/dL   BUN 24 8 - 27 mg/dL   Creatinine, Ser 1.67 (H) 0.76 - 1.27 mg/dL   GFR calc non Af Amer 43 (L) >59 mL/min/1.73   GFR calc Af Amer 50 (L) >59 mL/min/1.73   BUN/Creatinine Ratio 14 10 - 24   Sodium 142 134 - 144 mmol/L   Potassium 4.4 3.5 - 5.2 mmol/L   Chloride 102 96 - 106 mmol/L   CO2 25 18 - 29 mmol/L   Calcium 9.4 8.6 - 10.2 mg/dL   Total Protein 7.1 6.0 - 8.5 g/dL   Albumin 4.2 3.6 - 4.8 g/dL   Globulin, Total 2.9 1.5 - 4.5 g/dL   Albumin/Globulin Ratio 1.4 1.2 - 2.2   Bilirubin Total 0.6 0.0 - 1.2 mg/dL   Alkaline Phosphatase 122 (H) 39 - 117 IU/L   AST 19 0 - 40 IU/L   ALT 28 0 - 44 IU/L  Microalbumin, urine     Status: None   Collection Time: 06/05/16  5:28 PM  Result Value Ref Range   Albumin, Urine 92.2 Not Estab. ug/mL  Lipid panel     Status: Abnormal   Collection Time: 06/05/16  5:28 PM  Result Value Ref Range   Cholesterol, Total 164 100 - 199 mg/dL   Triglycerides 356 (H) 0 - 149 mg/dL   HDL 37 (L) >39 mg/dL   VLDL Cholesterol Cal 71 (H) 5 - 40 mg/dL   LDL Calculated 56 0 - 99 mg/dL   Chol/HDL Ratio 4.4 0.0 - 5.0 ratio    Comment:                                   T. Chol/HDL Ratio                                             Men  Women                               1/2 Avg.Risk  3.4    3.3  Avg.Risk  5.0    4.4                                2X Avg.Risk  9.6    7.1                                3X Avg.Risk 23.4   11.0   Hemoglobin A1c     Status: Abnormal   Collection Time: 06/28/16 12:00 AM  Result Value Ref Range   Hgb A1c MFr Bld 8.7 (H) 4.8  - 5.6 %    Comment: (NOTE)         Pre-diabetes: 5.7 - 6.4         Diabetes: >6.4         Glycemic control for adults with diabetes: <7.0    Mean Plasma Glucose 203 mg/dL    Comment: (NOTE) Performed At: [facility] [place], [place] [id number] Lindon Romp MD QQ:[id number]   Comprehensive metabolic panel     Status: Abnormal   Collection Time: 06/28/16 12:10 AM  Result Value Ref Range   Sodium 134 (L) 135 - 145 mmol/L   Potassium 3.9 3.5 - 5.1 mmol/L   Chloride 102 101 - 111 mmol/L   CO2 24 22 - 32 mmol/L   Glucose, Bld 265 (H) 65 - 99 mg/dL   BUN 17 6 - 20 mg/dL   Creatinine, Ser 1.46 (H) 0.61 - 1.24 mg/dL   Calcium 9.3 8.9 - 10.3 mg/dL   Total Protein 7.5 6.5 - 8.1 g/dL   Albumin 3.6 3.5 - 5.0 g/dL   AST 20 15 - 41 U/L   ALT 25 17 - 63 U/L   Alkaline Phosphatase 107 38 - 126 U/L   Total Bilirubin 0.9 0.3 - 1.2 mg/dL   GFR calc non Af Amer 50 (L) >60 mL/min   GFR calc Af Amer 58 (L) >60 mL/min    Comment: (NOTE) The eGFR has been calculated using the CKD EPI equation. This calculation has not been validated in all clinical situations. eGFR's persistently <60 mL/min signify possible Chronic Kidney Disease.    Anion gap 8 5 - 15  CBC WITH DIFFERENTIAL     Status: Abnormal   Collection Time: 06/28/16 12:10 AM  Result Value Ref Range   WBC 7.4 4.0 - 10.5 K/uL   RBC 4.87 4.22 - 5.81 MIL/uL   Hemoglobin 16.5 13.0 - 17.0 g/dL   HCT 45.7 39.0 - 52.0 %   MCV 93.8 78.0 - 100.0 fL   MCH 33.9 26.0 - 34.0 pg   MCHC 36.1 (H) 30.0 - 36.0 g/dL   RDW 14.0 11.5 - 15.5 %   Platelets 119 (L) 150 - 400 K/uL   Neutrophils Relative % 56 %   Neutro Abs 4.1 1.7 - 7.7 K/uL   Lymphocytes Relative 28 %   Lymphs Abs 2.1 0.7 - 4.0 K/uL   Monocytes Relative 14 %   Monocytes Absolute 1.0 0.1 - 1.0 K/uL   Eosinophils Relative 1 %   Eosinophils Absolute 0.1 0.0 - 0.7 K/uL   Basophils Relative 0 %   Basophils Absolute 0.0 0.0 - 0.1 K/uL  I-Stat CG4 Lactic Acid,  ED  (not at  [facility])     Status: None   Collection Time: 06/28/16 12:21 AM  Result Value Ref Range  Lactic Acid, Venous 1.47 0.5 - 1.9 mmol/L  Blood Culture (routine x 2)     Status: None   Collection Time: 06/28/16  1:20 AM  Result Value Ref Range   Specimen Description RIGHT ANTECUBITAL    Special Requests      BOTTLES DRAWN AEROBIC AND ANAEROBIC Blood Culture adequate volume   Culture NO GROWTH 5 DAYS    Report Status 07/03/2016 FINAL   Blood Culture (routine x 2)     Status: None   Collection Time: 06/28/16  1:28 AM  Result Value Ref Range   Specimen Description BLOOD RIGHT HAND    Special Requests      BOTTLES DRAWN AEROBIC AND ANAEROBIC Blood Culture results may not be optimal due to an inadequate volume of blood received in culture bottles   Culture NO GROWTH 5 DAYS    Report Status 07/03/2016 FINAL   CBC     Status: Abnormal   Collection Time: 06/28/16  7:02 AM  Result Value Ref Range   WBC 5.9 4.0 - 10.5 K/uL   RBC 4.65 4.22 - 5.81 MIL/uL   Hemoglobin 15.6 13.0 - 17.0 g/dL   HCT 43.8 39.0 - 52.0 %   MCV 94.2 78.0 - 100.0 fL   MCH 33.5 26.0 - 34.0 pg   MCHC 35.6 30.0 - 36.0 g/dL   RDW 14.0 11.5 - 15.5 %   Platelets 111 (L) 150 - 400 K/uL    Comment: SPECIMEN CHECKED FOR CLOTS PLATELET COUNT CONFIRMED BY SMEAR   Comprehensive metabolic panel     Status: Abnormal   Collection Time: 06/28/16  7:02 AM  Result Value Ref Range   Sodium 135 135 - 145 mmol/L   Potassium 3.5 3.5 - 5.1 mmol/L   Chloride 101 101 - 111 mmol/L   CO2 27 22 - 32 mmol/L   Glucose, Bld 245 (H) 65 - 99 mg/dL   BUN 16 6 - 20 mg/dL   Creatinine, Ser 1.34 (H) 0.61 - 1.24 mg/dL   Calcium 8.4 (L) 8.9 - 10.3 mg/dL   Total Protein 6.8 6.5 - 8.1 g/dL   Albumin 3.2 (L) 3.5 - 5.0 g/dL   AST 15 15 - 41 U/L   ALT 21 17 - 63 U/L   Alkaline Phosphatase 95 38 - 126 U/L   Total Bilirubin 1.1 0.3 - 1.2 mg/dL   GFR calc non Af Amer 55 (L) >60 mL/min   GFR calc Af Amer >60 >60 mL/min    Comment: (NOTE) The  eGFR has been calculated using the CKD EPI equation. This calculation has not been validated in all clinical situations. eGFR's persistently <60 mL/min signify possible Chronic Kidney Disease.    Anion gap 7 5 - 15  Glucose, capillary     Status: Abnormal   Collection Time: 06/28/16  8:05 AM  Result Value Ref Range   Glucose-Capillary 239 (H) 65 - 99 mg/dL   Comment 1 Notify RN   Glucose, capillary     Status: Abnormal   Collection Time: 06/28/16 12:12 PM  Result Value Ref Range   Glucose-Capillary 248 (H) 65 - 99 mg/dL   Comment 1 Notify RN   Urinalysis, Routine w reflex microscopic     Status: Abnormal   Collection Time: 06/28/16  2:23 PM  Result Value Ref Range   Color, Urine YELLOW YELLOW   APPearance HAZY (A) CLEAR   Specific Gravity, Urine 1.022 1.005 - 1.030   pH 7.0 5.0 - 8.0   Glucose,  UA >=500 (A) NEGATIVE mg/dL   Hgb urine dipstick SMALL (A) NEGATIVE   Bilirubin Urine NEGATIVE NEGATIVE   Ketones, ur NEGATIVE NEGATIVE mg/dL   Protein, ur 30 (A) NEGATIVE mg/dL   Nitrite NEGATIVE NEGATIVE   Leukocytes, UA NEGATIVE NEGATIVE   RBC / HPF 0-5 0 - 5 RBC/hpf   WBC, UA 0-5 0 - 5 WBC/hpf   Bacteria, UA NONE SEEN NONE SEEN   Squamous Epithelial / LPF 0-5 (A) NONE SEEN  Glucose, capillary     Status: Abnormal   Collection Time: 06/28/16  4:14 PM  Result Value Ref Range   Glucose-Capillary 298 (H) 65 - 99 mg/dL   Comment 1 Notify RN   Glucose, capillary     Status: Abnormal   Collection Time: 06/28/16  9:13 PM  Result Value Ref Range   Glucose-Capillary 191 (H) 65 - 99 mg/dL   Comment 1 Notify RN    Comment 2 Document in Chart   Glucose, capillary     Status: Abnormal   Collection Time: 06/29/16  8:06 AM  Result Value Ref Range   Glucose-Capillary 153 (H) 65 - 99 mg/dL   Comment 1 Notify RN   Glucose, capillary     Status: Abnormal   Collection Time: 06/29/16 12:07 PM  Result Value Ref Range   Glucose-Capillary 253 (H) 65 - 99 mg/dL   Comment 1 Notify RN      Objective: There were no vitals filed for this visit.  General: Patient is awake, alert, oriented x 3 and in no acute distress but with somber mood.  Dermatology: Skin is warm and dry bilateral with a full thickness ulceration present Plantar right hallux,  measures 1.0x1.5x0.4cm with granular base (larger than last week) with maceration, the ulceration does not probe to bone. There is no malodor, no active drainage, no erythema, decreased focal edema. No other acute signs of infection. No other open lesions.    Vascular: Dorsalis Pedis pulse = +4/4 on right, left 0/4,  Posterior Tibial pulse = 0/4 Bilateral,  Capillary Fill Time < 5 seconds. 1+ pitting edema and mild varicosities.  Neurologic: Protective sensation severely diminished to the level of the ankles with the 5.07/10g [employer].  Musculosketal: No Pain with palpation to ulcerated area at right 1st toe. No pain with compression to calves bilateral.  Assessment and Plan:  Problem List Items Addressed This Visit    None    Visit Diagnoses    Right foot ulcer, with fat layer exposed ([place])    -  Primary   Diabetic neuropathy with neurologic complication ([place])       PAD (peripheral artery disease) ([place])          -Examined patient and discussed the progression of the wounds and treatment alternatives. -Excisionally debrided ulcer at right hallux to healthy bleeding, Cleansed and applied Epifix 24 mm disk, reorder number [id number], GS [id number], expires 04/24/2021, this is graft #4 placed to site, the entire graft was used with no waste and secured with Adaptic and Steri-Strips and dressed with saline moistened gauze, 2 x 2's and [place]. Advised patient to keep dressing clean, dry and intact. If he does need to change the dressing, then recommend only changing the outer dressing layers as instructed. -Advised patient to continue with follow up with PCP for more recs on controlling blood sugars   -Advised  patient to go to the ER if worsens -Continue with postoperative shoe and encouraged limited activity to assist with healing  -  Patient to return to office in 1 week for possible reapplication of Epifix on right 1st toe or sooner if problems arise. -Rx Bactrim for possible UTI and Lidoderm patch for hip pain until patient can get in to see his PCP.   Landis Martins, DPM

## 2016-08-05 ENCOUNTER — Ambulatory Visit: Payer: BLUE CROSS/BLUE SHIELD | Admitting: Sports Medicine

## 2016-08-07 ENCOUNTER — Other Ambulatory Visit: Payer: BLUE CROSS/BLUE SHIELD

## 2016-08-07 ENCOUNTER — Ambulatory Visit: Payer: BLUE CROSS/BLUE SHIELD | Admitting: Sports Medicine

## 2016-08-07 ENCOUNTER — Ambulatory Visit: Payer: BLUE CROSS/BLUE SHIELD | Admitting: Family Medicine

## 2016-08-11 ENCOUNTER — Ambulatory Visit: Payer: BLUE CROSS/BLUE SHIELD | Admitting: Neurology

## 2016-08-11 ENCOUNTER — Ambulatory Visit: Payer: BLUE CROSS/BLUE SHIELD | Admitting: Internal Medicine

## 2016-08-11 NOTE — Progress Notes (Deleted)
[facility] Clinic Note - Initial Visit   Date: 08/11/16  [name] MRN: [id number] DOB: 1955-07-19   Dear [name]:  Thank you for your kind referral of Kaynan [name] [facility] for consultation of neuropathy. Although his history is well known to you, please allow [name] to reiterate it for the purpose of our medical record. The patient was accompanied to the clinic by *** who also provides collateral information.     History of Present Illness: Daniel [name] is a 61 y.o. right-handed Caucasian/*** male with hypertension, hyperlipidemia, HIV on HAART, OSA on CPAP, history of PE on xeralto,  presenting for evaluation of ***.    He has been seen by Dr. Brett Fairy for OSA and [name] for diabetic neuropathy and migraines.  He takes depakote 577m at bedtime for migraines.   Out-side paper records, electronic medical record, and images have been reviewed where available and summarized as: *** MRI brain 05/13/2015:  No acute abnormality. Small chronic infarct right pons, otherwise negative  MRI lumbar spine 06/15/2015:   1.   At L4-L5 there is moderate facet hypertrophy causing mild foraminal and minimal lateral recess narrowing. There is no nerve root impingement. 2.    At L5-S1 there is a small midline disc protrusion and mild facet hypertrophy. There is mild right and minimal left foraminal narrowing. There does not appear to be any nerve root impingement.  EEG 07/27/2015:  Normal  NCS/EMG legs 07/17/2015: This is a mild abnormal study. There is electrodiagnostic evidence of mild axonal length dependent sensorimotor polyneuropathy, consistent with his history of diabetic peripheral neuropathy. There is no evidence of bilateral lumbar sacral radiculopathy, there is no evidence of inflammatory myopathy.  Lab Results  Component Value Date   HGBA1C 8.7 (H) 06/28/2016   Lab Results  Component Value Date   TSH 0.19 (L) 07/11/2015     Past Medical History:  Diagnosis  Date  . ADHD (attention deficit hyperactivity disorder)   . Anxiety   . Chronic kidney disease   . Clotting disorder ([place]   . Depression   . Diabetes mellitus without complication ([place]   . Diabetes mellitus, type II ([place]   . Dizziness 03/17/2015  . GERD (gastroesophageal reflux disease)   . HIV disease ([place]   . HIV infection ([place]   . Liver disease   . OSA (obstructive sleep apnea) 07/25/2015   Uses CPAP regularly  . Peripheral vascular disease ([place]   . Ulcer     Past Surgical History:  Procedure Laterality Date  . SMALL INTESTINE SURGERY    . STOMACH SURGERY       Medications:  Outpatient Encounter Prescriptions as of 08/11/2016  Medication Sig Note  . acetaminophen (TYLENOL) 500 MG tablet Take 1,000 mg by mouth every 8 (eight) hours as needed for moderate pain.    .Marland KitchenALPRAZolam (XANAX) 1 MG tablet Take 1 mg by mouth 3 (three) times daily as needed for anxiety.    .Marland Kitchenamphetamine-dextroamphetamine (ADDERALL) 30 MG tablet Take 30 mg by mouth 3 (three) times daily.    .Marland Kitchenatorvastatin (LIPITOR) 10 MG tablet TAKE ONE TABLET BY MOUTH DAILY.   .Marland Kitchendiclofenac sodium (VOLTAREN) 1 % GEL APPLY 2 GRAMS TO EACH KNEE IN THE MORNING AND AT BEDTIME AND APPLY 1 GRAM TO EACH KNEE IN THE AFTERNOON.   .Marland Kitchendivalproex (DEPAKOTE ER) 500 MG 24 hr tablet Take 1 tablet (500 mg total) by mouth at bedtime.   . insulin aspart (NOVOLOG FLEXPEN)  100 UNIT/ML FlexPen 3 times a day (just before each meal) 06-28-13 units (Patient taking differently: Inject 10-15 Units into the skin 3 (three) times daily with meals. 3 times a day (just before each meal) 12-03-13 units)   . Insulin Glargine (LANTUS SOLOSTAR) 100 UNIT/ML Solostar Pen Inject 65 Units into the skin every morning.   . Insulin Pen Needle (PEN NEEDLES 3/16") 31G X 5 MM MISC 1 Device by Other route 4 (four) times daily. Use to inject Lantus daily.   [name] levothyroxine (SYNTHROID, LEVOTHROID) 50 MCG tablet Take 1 tablet (50 mcg total) by mouth at bedtime.   .  lidocaine (LIDODERM) 5 % Place 1 patch onto the skin daily. Remove & Discard patch within 12 hours or as directed by MD   . lidocaine (LIDODERM) 5 % Place 1 patch onto the skin daily. Remove & Discard patch within 12 hours or as directed by MD   . LYRICA 75 MG capsule TAKE ONE CAPSULE BY MOUTH THREE TIMES DAILY.   [name] ondansetron (ZOFRAN-ODT) 4 MG disintegrating tablet Take 2 tablets (8 mg total) by mouth every 8 (eight) hours as needed for nausea or vomiting.   . protriptyline (VIVACTIL) 10 MG tablet Take 10 mg by mouth 3 (three) times daily.  02/07/2016: Received from: External Pharmacy  . ranitidine (ZANTAC) 150 MG tablet Take 150 mg by mouth daily.   . rivaroxaban (XARELTO) 15 MG TABS tablet Take 1 tablet (15 mg total) by mouth daily.   [name] sulfamethoxazole-trimethoprim (BACTRIM DS,SEPTRA DS) 800-160 MG tablet Take 1 tablet by mouth 2 (two) times daily.   . TRINTELLIX 20 MG TABS Take 20 mg by mouth at bedtime.   . TRIUMEQ 600-50-300 MG tablet TAKE 1 TABLET BY MOUTH DAILY   . UNABLE TO FIND CPAP MACHINE with standard Aclaim nasal mask with humidifier. Set at 14 cwp 08/01/2014: USE  . zolpidem (AMBIEN) 10 MG tablet Take 10-20 mg by mouth at bedtime as needed for sleep.     No facility-administered encounter medications on file as of 08/11/2016.      Allergies:  Allergies  Allergen Reactions  . Aspirin Swelling  . Ibuprofen Swelling  . Sustiva [Efavirenz] Swelling and [name]    And [name].  . Nsaids Other (See Comments)    unknwn    Family History: Family History  Problem Relation Age of Onset  . Cancer Brother   . Depression Brother   . COPD Mother   . Diabetes Neg Hx     Social History: Social History  Substance Use Topics  . Smoking status: Former Smoker    Packs/day: 0.10    Years: 10.00    Types: Cigars, Cigarettes    Quit date: 08/09/2014  . Smokeless tobacco: Never Used  . Alcohol use No   Social History   Social History Narrative   Epworth Sleepiness Scale = 7 (as of  03/16/2015)    Review of Systems:  CONSTITUTIONAL: No fevers, chills, night sweats, or weight loss.  *** EYES: No visual changes or eye pain ENT: No hearing changes.  No history of nose bleeds.   RESPIRATORY: No cough, wheezing and shortness of breath.   CARDIOVASCULAR: Negative for chest pain, and palpitations.   GI: Negative for abdominal discomfort, blood in stools or [place] stools.  No recent change in bowel habits.   GU:  No history of incontinence.   MUSCLOSKELETAL: No history of joint pain or swelling.  No myalgias.   SKIN: Negative for lesions, [name], and itching.  HEMATOLOGY/ONCOLOGY: Negative for prolonged bleeding, bruising easily, and swollen nodes.  No history of cancer.   ENDOCRINE: Negative for cold or heat intolerance, polydipsia or goiter.   PSYCH:  ***depression or anxiety symptoms.   NEURO: As Above.   Vital Signs:  There were no vitals taken for this visit. Pain Scale: *** on a scale of 0-10   General Medical Exam:  *** General:  Well appearing, comfortable.   Eyes/ENT: see cranial nerve examination.   Neck: No masses appreciated.  Full range of motion without tenderness.  No carotid bruits. Respiratory:  Clear to auscultation, good air entry bilaterally.   Cardiac:  Regular rate and rhythm, no murmur.   Extremities:  No deformities, edema, or skin discoloration.  Skin:  No rashes or lesions.  Neurological Exam: MENTAL STATUS including orientation to time, place, person, recent and remote memory, attention span and concentration, language, and fund of knowledge is ***normal.  Speech is not dysarthric.  CRANIAL NERVES: II:  No visual field defects.  Unremarkable fundi.   III-IV-VI: Pupils equal round and reactive to light.  Normal conjugate, extra-ocular eye movements in all directions of gaze.  No nystagmus.  No ptosis***.   V:  Normal facial sensation.  Jaw jerk is ***.   VII:  Normal facial symmetry and movements.  No pathologic facial reflexes.  VIII:   Normal hearing and vestibular function.   IX-X:  Normal palatal movement.   XI:  Normal shoulder shrug and head rotation.   XII:  Normal tongue strength and range of motion, no deviation or fasciculation.  MOTOR:  No atrophy, fasciculations or abnormal movements.  No pronator drift.  Tone is normal.    Right Upper Extremity:    Left Upper Extremity:    Deltoid  5/5   Deltoid  5/5   Biceps  5/5   Biceps  5/5   Triceps  5/5   Triceps  5/5   Wrist extensors  5/5   Wrist extensors  5/5   Wrist flexors  5/5   Wrist flexors  5/5   Finger extensors  5/5   Finger extensors  5/5   Finger flexors  5/5   Finger flexors  5/5   Dorsal interossei  5/5   Dorsal interossei  5/5   Abductor pollicis  5/5   Abductor pollicis  5/5   Tone (Ashworth scale)  0  Tone (Ashworth scale)  0   Right Lower Extremity:    Left Lower Extremity:    Hip flexors  5/5   Hip flexors  5/5   Hip extensors  5/5   Hip extensors  5/5   Knee flexors  5/5   Knee flexors  5/5   Knee extensors  5/5   Knee extensors  5/5   Dorsiflexors  5/5   Dorsiflexors  5/5   Plantarflexors  5/5   Plantarflexors  5/5   Toe extensors  5/5   Toe extensors  5/5   Toe flexors  5/5   Toe flexors  5/5   Tone (Ashworth scale)  0  Tone (Ashworth scale)  0   MSRs:  Right  Left brachioradialis 2+  brachioradialis 2+  biceps 2+  biceps 2+  triceps 2+  triceps 2+  patellar 2+  patellar 2+  ankle jerk 2+  ankle jerk 2+  Hoffman no  Hoffman no  plantar response down  plantar response down   SENSORY:  Normal and symmetric perception of light touch, pinprick, vibration, and proprioception.  Romberg's sign absent.   COORDINATION/GAIT: Normal finger-to- nose-finger and heel-to-shin.  Intact rapid alternating movements bilaterally.  Able to rise from a chair without using arms.  Gait narrow based and stable. Tandem and stressed gait intact.    IMPRESSION: ***  PLAN/RECOMMENDATIONS:    *** Return to clinic in *** months.   The duration of this appointment visit was *** minutes of face-to-face time with the patient.  Greater than 50% of this time was spent in counseling, explanation of diagnosis, planning of further management, and coordination of care.   Thank you for allowing me to participate in patient's care.  If I can answer any additional questions, I would be pleased to do so.    Sincerely,    Odyssey Vasbinder K. Posey Pronto, DO

## 2016-08-12 ENCOUNTER — Ambulatory Visit: Payer: BLUE CROSS/BLUE SHIELD | Admitting: Sports Medicine

## 2016-08-13 ENCOUNTER — Telehealth: Payer: Self-pay | Admitting: *Deleted

## 2016-08-13 ENCOUNTER — Encounter (HOSPITAL_COMMUNITY): Payer: Self-pay | Admitting: Physical Therapy

## 2016-08-13 NOTE — Telephone Encounter (Addendum)
-----   Message from Landis Martins, [place] sent at 08/12/2016  5:02 PM EDT ----- Regarding: Check on patient He has missed wound graft appts. Please check to see how he's doing? Thanks [name]. 08/13/2016-Left message informing pt that [name] was very concerned that he had missed 2 of the appts for his would graft, and to please call to schedule or for help. Left message informing pt [name] was concerned that he had missed 2 appts and wanted to know how he was doing.

## 2016-08-13 NOTE — Therapy (Signed)
[facility] [place], [place], [address] Phone: [phone]   Fax:  [phone]  Patient Details  Name: KHYRON GARNO MRN: [id number] Date of Birth: 20-Sep-1955 Referring Provider:  No ref. provider found  Encounter Date: 08/13/2016   PHYSICAL THERAPY DISCHARGE SUMMARY  Visits from Start of Care: 4 (1 wound, 3 ortho)  Current functional level related to goals / functional outcomes: Patient has not returned since last skilled session    Remaining deficits: Unable to assess    Education / Equipment: N/A  Plan: Patient agrees to discharge.  Patient goals were not met. Patient is being discharged due to meeting the stated rehab goals.  ?????      Deniece Ree PT, DPT [facility] [address] [place], [place], [address] Phone: [phone]   Fax:  [phone]

## 2016-08-13 NOTE — Telephone Encounter (Signed)
-----   Message from Landis Martins, [place] sent at 08/12/2016  5:02 PM EDT ----- Regarding: Check on patient He has missed wound graft appts. Please check to see how he's doing? Thanks [name]

## 2016-08-18 ENCOUNTER — Ambulatory Visit: Payer: BLUE CROSS/BLUE SHIELD | Admitting: Endocrinology

## 2016-08-19 ENCOUNTER — Ambulatory Visit: Payer: BLUE CROSS/BLUE SHIELD | Admitting: Sports Medicine

## 2016-08-19 ENCOUNTER — Other Ambulatory Visit: Payer: Self-pay | Admitting: Family Medicine

## 2016-08-19 ENCOUNTER — Other Ambulatory Visit: Payer: Self-pay | Admitting: Emergency Medicine

## 2016-08-19 DIAGNOSIS — E038 Other specified hypothyroidism: Secondary | ICD-10-CM

## 2016-08-24 HISTORY — PX: TOE AMPUTATION: SHX809

## 2016-08-25 ENCOUNTER — Telehealth: Payer: Self-pay | Admitting: Sports Medicine

## 2016-08-25 NOTE — Telephone Encounter (Signed)
Pt called and was in the hospital that is why he missed his 6.26.18 appt. I worked him in 7.3.18.

## 2016-08-26 ENCOUNTER — Ambulatory Visit (INDEPENDENT_AMBULATORY_CARE_PROVIDER_SITE_OTHER): Payer: BLUE CROSS/BLUE SHIELD | Admitting: Sports Medicine

## 2016-08-26 ENCOUNTER — Encounter: Payer: Self-pay | Admitting: Sports Medicine

## 2016-08-26 DIAGNOSIS — E1149 Type 2 diabetes mellitus with other diabetic neurological complication: Secondary | ICD-10-CM

## 2016-08-26 DIAGNOSIS — I739 Peripheral vascular disease, unspecified: Secondary | ICD-10-CM

## 2016-08-26 DIAGNOSIS — E1140 Type 2 diabetes mellitus with diabetic neuropathy, unspecified: Secondary | ICD-10-CM | POA: Diagnosis not present

## 2016-08-26 DIAGNOSIS — Z89411 Acquired absence of right great toe: Secondary | ICD-10-CM | POA: Diagnosis not present

## 2016-08-26 NOTE — Progress Notes (Signed)
Subjective: Daniel [name] is a 61 y.o. male patient seen today in office for follow up evaluation of right foot ulcer. Reports while at [facility] had toe amputated. Patient reports that he was out of it and does not remember giving consent for toe to be amputated and reports that since his discharge from hospital has been referred to ID for continuing his PICC line daptomycin which will end 09-24-16. Patient denies any other issues.   Patient Active Problem List   Diagnosis Date Noted  . Diabetic foot infection ([place]) 06/28/2016  . Screening examination for venereal disease 02/11/2016  . Critical lower limb ischemia 12/07/2015  . Fall 12/05/2015  . Toe ulcer, right ([place]) 09/19/2015  . Decreased pedal pulses 09/19/2015  . OSA on CPAP 09/05/2015  . Major depressive disorder, recurrent episode, moderate ([place]) 09/05/2015  . OSA (obstructive sleep apnea) 07/25/2015  . Passed out 06/28/2015  . Low back pain 06/11/2015  . Abnormality of gait 06/11/2015  . Dizziness 03/17/2015  . Weakness 02/21/2015  . Chronic renal insufficiency, stage III (moderate) 08/09/2014  . Diabetes ([place]) 11/07/2013  . Hematuria 06/21/2013  . Hepatic steatosis 09/09/2010  . UTI 09/15/2006  . PYURIA 09/03/2006  . Human immunodeficiency virus (HIV) disease ([place]) 06/04/2006  . HERPES ZOSTER, UNCOMPLICATED 70/35/0093  . HSV 06/04/2006  . Depression 06/04/2006  . DISORDER, ATTENTION DEFICIT W/HYPERACTIVITY 06/04/2006  . THROMBOPHLEBITIS NOS 06/04/2006  . GERD 06/04/2006  . ARTHRITIS, HAND 06/04/2006  . HYPERGLYCEMIA, HX OF 06/04/2006  . HEPATITIS B, HX OF 06/04/2006    Current Outpatient Prescriptions on File Prior to Visit  Medication Sig Dispense Refill  . acetaminophen (TYLENOL) 500 MG tablet Take 1,000 mg by mouth every 8 (eight) hours as needed for moderate pain.     [name] ALPRAZolam (XANAX) 1 MG tablet Take 1 mg by mouth 3 (three) times daily as needed for anxiety.     [name] amphetamine-dextroamphetamine (ADDERALL)  30 MG tablet Take 30 mg by mouth 3 (three) times daily.     [name] atorvastatin (LIPITOR) 10 MG tablet TAKE ONE TABLET BY MOUTH DAILY. 90 tablet 0  . diclofenac sodium (VOLTAREN) 1 % GEL APPLY 2 GRAMS TO EACH KNEE IN THE MORNING AND AT BEDTIME AND APPLY 1 GRAM TO EACH KNEE IN THE AFTERNOON. 300 g 0  . divalproex (DEPAKOTE ER) 500 MG 24 hr tablet Take 1 tablet (500 mg total) by mouth at bedtime. 90 tablet 4  . insulin aspart (NOVOLOG FLEXPEN) 100 UNIT/ML FlexPen 3 times a day (just before each meal) 06-28-13 units (Patient taking differently: Inject 10-15 Units into the skin 3 (three) times daily with meals. 3 times a day (just before each meal) 12-03-13 units) 15 mL 11  . Insulin Glargine (LANTUS SOLOSTAR) 100 UNIT/ML Solostar Pen Inject 65 Units into the skin every morning. 10 pen PRN  . Insulin Pen Needle (PEN NEEDLES 3/16") 31G X 5 MM MISC 1 Device by Other route 4 (four) times daily. Use to inject Lantus daily. 120 each 11  . levothyroxine (SYNTHROID, LEVOTHROID) 50 MCG tablet Take 1 tablet (50 mcg total) by mouth at bedtime. 30 tablet 11  . lidocaine (LIDODERM) 5 % Place 1 patch onto the skin daily. Remove & Discard patch within 12 hours or as directed by MD 30 patch 0  . lidocaine (LIDODERM) 5 % Place 1 patch onto the skin daily. Remove & Discard patch within 12 hours or as directed by MD 30 patch 11  . LYRICA 75 MG capsule TAKE  ONE CAPSULE BY MOUTH THREE TIMES DAILY. 90 capsule 5  . ondansetron (ZOFRAN-ODT) 4 MG disintegrating tablet Take 2 tablets (8 mg total) by mouth every 8 (eight) hours as needed for nausea or vomiting. 60 tablet 5  . protriptyline (VIVACTIL) 10 MG tablet Take 10 mg by mouth 3 (three) times daily.   11  . ranitidine (ZANTAC) 150 MG tablet Take 150 mg by mouth daily.    . rivaroxaban (XARELTO) 15 MG TABS tablet Take 1 tablet (15 mg total) by mouth daily. 30 tablet 11  . sulfamethoxazole-trimethoprim (BACTRIM DS,SEPTRA DS) 800-160 MG tablet Take 1 tablet by mouth 2 (two) times  daily. 28 tablet 0  . TRINTELLIX 20 MG TABS Take 20 mg by mouth at bedtime.    . TRIUMEQ 600-50-300 MG tablet TAKE 1 TABLET BY MOUTH DAILY 30 tablet 6  . UNABLE TO FIND CPAP MACHINE with standard Aclaim nasal mask with humidifier. Set at 14 cwp 1 each 0  . zolpidem (AMBIEN) 10 MG tablet Take 10-20 mg by mouth at bedtime as needed for sleep.      No current facility-administered medications on file prior to visit.     Allergies  Allergen Reactions  . Aspirin Swelling  . Ibuprofen Swelling  . Sustiva [Efavirenz] Swelling and Rash    And rash.  . Nsaids Other (See Comments)    unknwn    Objective: There were no vitals filed for this visit.  General: No acute distress, AAOx3  Right foot: Sutures intact with no gapping or dehiscence at surgical hallux amputation site, moderate swelling to right forefoot, blanchable erythema, mild warmth, no drainage, no other signs of infection noted, Capillary fill time <5 seconds in all remaining toes on right, protective sensation diminished on right, No pain or crepitation with range of motion right foot. No pain with calf compression.   Assessment and Plan:  Problem List Items Addressed This Visit    None    Visit Diagnoses    Status post amputation of right great toe ([place])    -  Primary   Diabetic neuropathy with neurologic complication ([place])       PAD (peripheral artery disease) ([place])           -Patient seen and evaluated -Applied dry sterile dressing to surgical site right foot secured with ACE wrap and stockinet  -Advised patient to make sure to keep dressings clean, dry, and intact to right surgical site -Advised patient to continue with post-op shoe on right foot   -Advised patient to limit activity to necessity  -Advised patient to elevate  -Continue with follow up with ID and PICC line abx of Daptomycin to end 09-24-16 -Will plan for xrays and possible suture removal at next office visit. In the meantime, patient to call office if any  issues or problems arise. I will have medical records faxed to office from [facility] to review with patient next visit since he sates he blacked out and does not recall what happened during his admission.   Landis Martins, DPM

## 2016-09-04 ENCOUNTER — Ambulatory Visit: Payer: BLUE CROSS/BLUE SHIELD | Admitting: Neurology

## 2016-09-04 ENCOUNTER — Encounter: Payer: Self-pay | Admitting: Neurology

## 2016-09-08 ENCOUNTER — Other Ambulatory Visit: Payer: Self-pay | Admitting: Family Medicine

## 2016-09-11 ENCOUNTER — Ambulatory Visit: Payer: BLUE CROSS/BLUE SHIELD | Admitting: Family Medicine

## 2016-09-11 ENCOUNTER — Ambulatory Visit: Payer: BLUE CROSS/BLUE SHIELD | Admitting: Sports Medicine

## 2016-09-12 ENCOUNTER — Ambulatory Visit (INDEPENDENT_AMBULATORY_CARE_PROVIDER_SITE_OTHER): Payer: BLUE CROSS/BLUE SHIELD | Admitting: Sports Medicine

## 2016-09-12 ENCOUNTER — Ambulatory Visit: Payer: BLUE CROSS/BLUE SHIELD | Admitting: Family Medicine

## 2016-09-12 DIAGNOSIS — Z89411 Acquired absence of right great toe: Secondary | ICD-10-CM

## 2016-09-12 DIAGNOSIS — E1140 Type 2 diabetes mellitus with diabetic neuropathy, unspecified: Secondary | ICD-10-CM | POA: Diagnosis not present

## 2016-09-12 DIAGNOSIS — I739 Peripheral vascular disease, unspecified: Secondary | ICD-10-CM

## 2016-09-12 DIAGNOSIS — E1149 Type 2 diabetes mellitus with other diabetic neurological complication: Secondary | ICD-10-CM | POA: Diagnosis not present

## 2016-09-12 NOTE — Progress Notes (Signed)
Subjective: Daniel [name] is a 61 y.o. male patient seen today in office for follow up evaluation of right foot, s/p Right hallux amputation done at [facility]. Patient is also seeing ID for his PICC line daptomycin and Invanz which will end 09-24-16. Patient denies any other issues.   Patient Active Problem List   Diagnosis Date Noted  . Diabetic foot infection ([place]) 06/28/2016  . Screening examination for venereal disease 02/11/2016  . Critical lower limb ischemia 12/07/2015  . Fall 12/05/2015  . Toe ulcer, right ([place]) 09/19/2015  . Decreased pedal pulses 09/19/2015  . OSA on CPAP 09/05/2015  . Major depressive disorder, recurrent episode, moderate ([place]) 09/05/2015  . OSA (obstructive sleep apnea) 07/25/2015  . Passed out 06/28/2015  . Low back pain 06/11/2015  . Abnormality of gait 06/11/2015  . Dizziness 03/17/2015  . Weakness 02/21/2015  . Chronic renal insufficiency, stage III (moderate) 08/09/2014  . Diabetes ([place]) 11/07/2013  . Hematuria 06/21/2013  . Hepatic steatosis 09/09/2010  . UTI 09/15/2006  . PYURIA 09/03/2006  . Human immunodeficiency virus (HIV) disease ([place]) 06/04/2006  . HERPES ZOSTER, UNCOMPLICATED 16/11/9602  . HSV 06/04/2006  . Depression 06/04/2006  . DISORDER, ATTENTION DEFICIT W/HYPERACTIVITY 06/04/2006  . THROMBOPHLEBITIS NOS 06/04/2006  . GERD 06/04/2006  . ARTHRITIS, HAND 06/04/2006  . HYPERGLYCEMIA, HX OF 06/04/2006  . HEPATITIS B, HX OF 06/04/2006    Current Outpatient Prescriptions on File Prior to Visit  Medication Sig Dispense Refill  . acetaminophen (TYLENOL) 500 MG tablet Take 1,000 mg by mouth every 8 (eight) hours as needed for moderate pain.     [name] ALPRAZolam (XANAX) 1 MG tablet Take 1 mg by mouth 3 (three) times daily as needed for anxiety.     [name] amphetamine-dextroamphetamine (ADDERALL) 30 MG tablet Take 30 mg by mouth 3 (three) times daily.     [name] atorvastatin (LIPITOR) 10 MG tablet TAKE ONE TABLET BY MOUTH DAILY. 90 tablet 0  .  diclofenac sodium (VOLTAREN) 1 % GEL APPLY 2 GRAMS TO EACH KNEE IN THE MORNING AND AT BEDTIME AND APPLY 1 GRAM TO EACH KNEE IN THE AFTERNOON. 300 g 0  . divalproex (DEPAKOTE ER) 500 MG 24 hr tablet Take 1 tablet (500 mg total) by mouth at bedtime. 90 tablet 4  . insulin aspart (NOVOLOG FLEXPEN) 100 UNIT/ML FlexPen 3 times a day (just before each meal) 06-28-13 units (Patient taking differently: Inject 10-15 Units into the skin 3 (three) times daily with meals. 3 times a day (just before each meal) 12-03-13 units) 15 mL 11  . Insulin Glargine (LANTUS SOLOSTAR) 100 UNIT/ML Solostar Pen Inject 65 Units into the skin every morning. 10 pen PRN  . Insulin Pen Needle (PEN NEEDLES 3/16") 31G X 5 MM MISC 1 Device by Other route 4 (four) times daily. Use to inject Lantus daily. 120 each 11  . levothyroxine (SYNTHROID, LEVOTHROID) 50 MCG tablet Take 1 tablet (50 mcg total) by mouth at bedtime. 30 tablet 11  . lidocaine (LIDODERM) 5 % Place 1 patch onto the skin daily. Remove & Discard patch within 12 hours or as directed by MD 30 patch 0  . lidocaine (LIDODERM) 5 % Place 1 patch onto the skin daily. Remove & Discard patch within 12 hours or as directed by MD 30 patch 11  . LYRICA 75 MG capsule TAKE ONE CAPSULE BY MOUTH THREE TIMES DAILY. 90 capsule 5  . ondansetron (ZOFRAN-ODT) 4 MG disintegrating tablet Take 2 tablets (8 mg total) by mouth every  8 (eight) hours as needed for nausea or vomiting. 60 tablet 5  . protriptyline (VIVACTIL) 10 MG tablet Take 10 mg by mouth 3 (three) times daily.   11  . ranitidine (ZANTAC) 150 MG tablet Take 150 mg by mouth daily.    . rivaroxaban (XARELTO) 15 MG TABS tablet Take 1 tablet (15 mg total) by mouth daily. 30 tablet 11  . sulfamethoxazole-trimethoprim (BACTRIM DS,SEPTRA DS) 800-160 MG tablet Take 1 tablet by mouth 2 (two) times daily. 28 tablet 0  . TRINTELLIX 20 MG TABS Take 20 mg by mouth at bedtime.    . TRIUMEQ 600-50-300 MG tablet TAKE 1 TABLET BY MOUTH DAILY 30 tablet  6  . UNABLE TO FIND CPAP MACHINE with standard Aclaim nasal mask with humidifier. Set at 14 cwp 1 each 0  . zolpidem (AMBIEN) 10 MG tablet Take 10-20 mg by mouth at bedtime as needed for sleep.      No current facility-administered medications on file prior to visit.     Allergies  Allergen Reactions  . Aspirin Swelling  . Ibuprofen Swelling  . Sustiva [Efavirenz] Swelling and Rash    And rash.  . Nsaids Other (See Comments)    unknwn    Objective: There were no vitals filed for this visit.  General: No acute distress, AAOx3  Right foot: Sutures removed by doctor who did the surgery on yesterday per the patient. There is maceration at the proximal incision line with mild gapping at the proximal area of the surgical hallux amputation site, moderate swelling to right forefoot, blanchable erythema, decreased warmth, no drainage, no other signs of infection noted, Capillary fill time <5 seconds in all remaining toes on right, protective sensation diminished on right, No pain or crepitation with range of motion right foot. No pain with calf compression.   Assessment and Plan:  Problem List Items Addressed This Visit    None    Visit Diagnoses    Status post amputation of right great toe ([place])    -  Primary   Diabetic neuropathy with neurologic complication ([place])       PAD (peripheral artery disease) ([place])           -Patient seen and evaluated -Applied betadine and dry sterile dressing to surgical site right foot secured with ACE wrap and stockinet  -Advised patient to make sure she follows up with original surgeon and if his dressing comes off or get wet to apply dry dressing and call office -Advised patient to continue with post-op shoe on right foot, a new shoe was dispensed at today's visit -Advised patient to limit activity to necessity  -Advised patient to elevate for edema control   -Continue with follow up with ID and PICC line abx to end 09-24-16 -Will plan for xrays and  incision check at next office visit. Patient can follow up with me or his original surgeon for this. In the meantime, patient to call office if any issues or problems arise. I am awaiting [facility] to send me his medical records from most recent admission.  Landis Martins, DPM

## 2016-09-15 ENCOUNTER — Telehealth: Payer: Self-pay

## 2016-09-15 ENCOUNTER — Encounter: Payer: Self-pay | Admitting: Family Medicine

## 2016-09-15 ENCOUNTER — Ambulatory Visit (INDEPENDENT_AMBULATORY_CARE_PROVIDER_SITE_OTHER): Payer: BLUE CROSS/BLUE SHIELD | Admitting: Family Medicine

## 2016-09-15 VITALS — BP 134/80 | HR 91 | Temp 98.7°F | Resp 18 | Ht 70.87 in | Wt 237.0 lb

## 2016-09-15 DIAGNOSIS — R441 Visual hallucinations: Secondary | ICD-10-CM

## 2016-09-15 DIAGNOSIS — F05 Delirium due to known physiological condition: Secondary | ICD-10-CM | POA: Diagnosis not present

## 2016-09-15 DIAGNOSIS — D649 Anemia, unspecified: Secondary | ICD-10-CM | POA: Diagnosis not present

## 2016-09-15 DIAGNOSIS — R410 Disorientation, unspecified: Secondary | ICD-10-CM

## 2016-09-15 DIAGNOSIS — D696 Thrombocytopenia, unspecified: Secondary | ICD-10-CM | POA: Diagnosis not present

## 2016-09-15 DIAGNOSIS — G629 Polyneuropathy, unspecified: Secondary | ICD-10-CM

## 2016-09-15 DIAGNOSIS — E1140 Type 2 diabetes mellitus with diabetic neuropathy, unspecified: Secondary | ICD-10-CM | POA: Diagnosis not present

## 2016-09-15 DIAGNOSIS — Z9181 History of falling: Secondary | ICD-10-CM

## 2016-09-15 DIAGNOSIS — N183 Chronic kidney disease, stage 3 unspecified: Secondary | ICD-10-CM

## 2016-09-15 DIAGNOSIS — Z794 Long term (current) use of insulin: Secondary | ICD-10-CM

## 2016-09-15 DIAGNOSIS — M869 Osteomyelitis, unspecified: Secondary | ICD-10-CM | POA: Diagnosis not present
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
## 2016-09-15 NOTE — Telephone Encounter (Signed)
Advised pt that he has a neurology appointment with [name] on 09/18/16 at 0830. He also has an appointment with [facility] psychiatry on 10/07/16 at 1715.
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
## 2016-09-17 ENCOUNTER — Encounter: Payer: Self-pay | Admitting: Neurology

## 2016-09-17 ENCOUNTER — Ambulatory Visit (INDEPENDENT_AMBULATORY_CARE_PROVIDER_SITE_OTHER): Payer: BLUE CROSS/BLUE SHIELD | Admitting: Neurology

## 2016-09-17 VITALS — BP 153/88 | HR 94 | Ht 70.5 in | Wt 238.0 lb

## 2016-09-17 DIAGNOSIS — E0849 Diabetes mellitus due to underlying condition with other diabetic neurological complication: Secondary | ICD-10-CM | POA: Diagnosis not present

## 2016-09-17 DIAGNOSIS — G43709 Chronic migraine without aura, not intractable, without status migrainosus: Secondary | ICD-10-CM

## 2016-09-17 DIAGNOSIS — Z794 Long term (current) use of insulin: Secondary | ICD-10-CM

## 2016-09-17 DIAGNOSIS — R410 Disorientation, unspecified: Secondary | ICD-10-CM

## 2016-09-17 DIAGNOSIS — R269 Unspecified abnormalities of gait and mobility: Secondary | ICD-10-CM | POA: Diagnosis not present

## 2016-09-17 DIAGNOSIS — IMO0002 Reserved for concepts with insufficient information to code with codable children: Secondary | ICD-10-CM

## 2016-09-17 NOTE — Progress Notes (Addendum)
Chief Complaint  Patient presents with  . Peripheral Neuropathy    He continues to have burning, tingling and pain in his bilateral feet despite taking Lyrica 69m, TID.  His symptoms are worse at night.   . Migraine    His migraines are well controlled.  No concerns.  . OSA on CPAP    Says he is weaing his CPAP nightly.  . Memory Loss    MMSE 26/30 - 15 animals.  States his aunt's home health nurse observed a decline in his memory. She also reported visual hallucinations, falls and decreased alertness.  He disagrees with her report.       PATIENT: Daniel SCHUTTERDOB: [birth year] Chief Complaint  Patient presents with  . Peripheral Neuropathy    He continues to have burning, tingling and pain in his bilateral feet despite taking Lyrica 787m TID.  His symptoms are worse at night.   . Migraine    His migraines are well controlled.  No concerns.  . OSA on CPAP    Says he is weaing his CPAP nightly.  . Memory Loss    MMSE 26/30 - 15 animals.  States his aunt's home health nurse observed a decline in his memory. She also reported visual hallucinations, falls and decreased alertness.  He disagrees with her report.      HISTORICAL  DoJONTUE CRUMPACKERs a [age]ears old right-handed male , seen in refer by Dr. DoBrett Fairynd his primary care physician Dr. 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  He has some chronic low back pain, neck pain, but denies radiating pain to upper or lower extremity, he has bilateral feet paresthesia due to diabetic peripheral neuropathy for many years, denies  bowel and bladder incontinence  In February 21 2015, he fell asleep on his sofa, when he tried to get up next morning, he had difficulty bearing weight, bilateral lower extremity weakness, [facility] he was discharged the same day, was able to ambulate again, there was no clear etiology found.  But since that event, he fell 3 times over the past few months, his right toes tend to be caught on the floor, sometimes without clear triggers.  I have personally reviewed MRI of the brain without contrast in March 2017, mild supratentorium small vessel disease,  right ventral pontine small vessel disease, less likely explaining his complains of gait difficulty   UPDATE May 4th 2017: Echocardiogram in February 2017: Ejection fraction 60-65%, word thickness was increased in a pattern of mild left ventricular hypertrophy.  We have personally reviewed MRI of lumbar in April 2017:  At L4-L5 there is moderate facet hypertrophy causing mild foraminal and minimal lateral recess narrowing. There is no nerve root impingement.  L5-S1 there is a small midline disc protrusion and mild facet hypertrophy. There is mild right and minimal left foraminal narrowing. There does not appear to be any nerve root impingement.  Recent sleep study confirmed obstructive sleep apnea, CPAP is recommended  Today he complains lightheadedness with sudden  positional change, intermittent bilateral feet paresthesia, he continue have falling episode, when he woke up from sleep, sofa, confused, could not get up  UPDATE August 16th 2017: EEG was normal in June 2017. EMG nerve conduction study consistent with mild axonal peripheral neuropathy consistent with his diagnosis of diabetes, He was put on Depakote ER 500 mg every night since May 2017, there was no recurrent confusion episode but he has occasionally bilateral lower extremity give out underneath him,  difficulty getting up, he also complains of frequent orthostatic dizziness. Reviewed laboratory evaluation A1c 7.0 in July, normal CBC, hemoglobin 15 point 9, but in September 12 2015, glucose level was 414, creatinine 1.66,  He also complains of chronic migraine headaches,  UPDATE Oct 11th 2017: He returned to clinic for recurrent episode of frequent falling, he actually was referred to emergency room by his primary care physician on November 27 2015, for similar complaints  He reported one episode when he got up from overnight sleep, rushing to the bathroom, felt lightheaded almost fall, also reported recurrent episodes of falling forward after kneeling down working on his yard work.  He has poorly controlled DM, A1c in September 2017 was 8.2, he has evidence of diabetic peripheral neuropathy, mild orthostatic blood pressure changes on today's visit   he is also on polypharmacy treatment DVT, taking chronic Xarelto,  Ambien 10 mg for chronic insomnia, continue take Depakote ER 500 mg every night as migraine prevention, xanax 15m 3-4 times a day  Personally reviewed CT head without contrast in October 2017, no acute abnormality  UPDATE September 17 2016: [facility], home IV antibiotic treatment, was noted by home IV nurse has changed in mental status, occasionally confusion, alertness, trouble interpreting visual information, unsteady gait,  I have personally reviewed CT head without contrast in May 2018, generalized atrophy, periventricular small vessel disease.  Laboratory evaluations in May 2018, elevated creatinine 1.34, CBC showed low platelet 111, A1c 8.7, UDS on September 23 2015 was positive for amphetamine, barbiturate, methadone, oxycodone.   MRI of the brain March 2017: No acute abnormality, lacunar infarction in right pons  REVIEW OF SYSTEMS: Full 14 system review of systems performed and notable  only for: Fatigue, hearing loss, runny nose, cough, shortness of breath, leg swelling, eye itching, rectal bleeding constipation, apnea, daytime sleepiness, joint pain, swelling, back pain, achy muscles, cramps memory loss, dizziness, passing out, agitation, confusion, decreased concentration, depression, hallucinations, hyperactivity  ALLERGIES: Allergies  Allergen Reactions  . Aspirin Swelling  . Ibuprofen Swelling  . Sustiva [Efavirenz] Swelling and Rash    And rash.  . Nsaids Other (See Comments)    unknwn    HOME MEDICATIONS: Current Outpatient Prescriptions  Medication Sig Dispense Refill  . acetaminophen (TYLENOL) 500 MG tablet Take 1,000 mg by mouth every 8 (eight) hours as needed for moderate pain.     .Marland KitchenALPRAZolam (XANAX) 1 MG tablet Take 1 mg by mouth 3 (three) times daily as needed for anxiety.     .Marland Kitchenamphetamine-dextroamphetamine (ADDERALL) 30 MG tablet Take 30 mg by mouth 3 (three) times daily.     .Marland Kitchenatorvastatin (LIPITOR) 10 MG tablet TAKE ONE TABLET BY MOUTH DAILY. 90 tablet 0  . DAPTOmycin (CUBICIN) 500 MG injection Inject into the vein.    .Marland Kitchendiclofenac sodium (VOLTAREN) 1 % GEL APPLY 2 GRAMS TO EACH KNEE IN THE MORNING AND AT BEDTIME AND APPLY 1  GRAM TO EACH KNEE IN THE AFTERNOON. 300 g 0  . divalproex (DEPAKOTE ER) 500 MG 24 hr tablet Take 1 tablet (500 mg total) by mouth at bedtime. 90 tablet 4  . heparin flush 10 UNIT/ML SOLN injection 10 Units once.    . insulin aspart (NOVOLOG FLEXPEN) 100 UNIT/ML FlexPen 3 times a day (just before each meal) 06-28-13 units (Patient taking differently: Inject 10-15 Units into the skin 3 (three) times daily with meals. 3 times a day (just before each meal) 12-03-13 units) 15 mL 11  . Insulin Glargine (LANTUS SOLOSTAR) 100 UNIT/ML Solostar Pen Inject 65 Units into the skin every morning. 10 pen PRN  . Insulin Pen Needle (PEN NEEDLES 3/16") 31G X 5 MM MISC 1 Device by Other route 4 (four) times daily. Use to inject Lantus daily. 120 each 11   . levothyroxine (SYNTHROID, LEVOTHROID) 50 MCG tablet Take 1 tablet (50 mcg total) by mouth at bedtime. 30 tablet 11  . LYRICA 75 MG capsule TAKE ONE CAPSULE BY MOUTH THREE TIMES DAILY. 90 capsule 5  . ondansetron (ZOFRAN-ODT) 4 MG disintegrating tablet Take 2 tablets (8 mg total) by mouth every 8 (eight) hours as needed for nausea or vomiting. 60 tablet 5  . protriptyline (VIVACTIL) 10 MG tablet Take 10 mg by mouth 3 (three) times daily.   11  . ranitidine (ZANTAC) 150 MG tablet Take 150 mg by mouth daily.    . rivaroxaban (XARELTO) 15 MG TABS tablet Take 1 tablet (15 mg total) by mouth daily. 30 tablet 11  . sodium chloride 0.9 % injection Inject into the vein as needed.    . TRINTELLIX 20 MG TABS Take 20 mg by mouth at bedtime.    . TRIUMEQ 600-50-300 MG tablet TAKE 1 TABLET BY MOUTH DAILY 30 tablet 6  . UNABLE TO FIND CPAP MACHINE with standard Aclaim nasal mask with humidifier. Set at 14 cwp 1 each 0  . zolpidem (AMBIEN) 10 MG tablet Take 10-20 mg by mouth at bedtime as needed for sleep.      No current facility-administered medications for this visit.     PAST MEDICAL HISTORY: Past Medical History:  Diagnosis Date  . ADHD (attention deficit hyperactivity disorder)   . Anxiety   . Chronic kidney disease   . Clotting disorder ([place])   . Depression   . Diabetes mellitus without complication ([place])   . Diabetes mellitus, type II ([place])   . Dizziness 03/17/2015  . GERD (gastroesophageal reflux disease)   . HIV disease ([place])   . HIV infection ([place])   . Liver disease   . OSA (obstructive sleep apnea) 07/25/2015   Uses CPAP regularly  . Peripheral vascular disease ([place])   . Ulcer     PAST SURGICAL HISTORY: Past Surgical History:  Procedure Laterality Date  . SMALL INTESTINE SURGERY    . STOMACH SURGERY      FAMILY HISTORY: Family History  Problem Relation Age of Onset  . Cancer Brother   . Depression Brother   . COPD Mother   . Diabetes Neg Hx     SOCIAL  HISTORY:  Social History   Social History  . Marital Status: Single    Spouse Name: N/A  . Number of Children: N/A  . Years of Education: N/A   Occupational History  . 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 Smoking status: Former  Smoker -- 0.10 packs/day for 10 years    Types: Cigars, Cigarettes  . Smokeless tobacco: Never Used  . Alcohol Use: No  . Drug Use: No  . Sexual Activity:    Partners: Male     Comment: pt. declined condoms   Other Topics Concern  . Not on file   Social History Narrative   Epworth Sleepiness Scale = 7 (as of 03/16/2015)     PHYSICAL EXAM   Vitals:   09/17/16 0901  BP: (!) 153/88  Pulse: 94  Weight: 238 lb (108 kg)  Height: 5' 10.5" (1.791 m)    Not recorded    Blood pressure lying down 148/84, heart rate of 101, standing up 132/72 heart rate of 101  Body mass index is 33.67 kg/m.  PHYSICAL EXAMNIATION:  Gen: NAD, conversant, well nourised, obese, well groomed                     Cardiovascular: Regular rate rhythm, no peripheral edema, warm, nontender. Eyes: Conjunctivae clear without exudates or hemorrhage Neck: Supple, no carotid bruise. Pulmonary: Clear to auscultation bilaterally   NEUROLOGICAL EXAM: MMSE - Mini Mental State Exam 09/17/2016  Orientation to time 3  Orientation to Place 5  Registration 3  Attention/ Calculation 5  Recall 1  Language- name 2 objects 2  Language- repeat 1  Language- follow 3 step command 3  Language- read & follow direction 1  Write a sentence 1  Copy design 1  Total score 26  animal naming 15    CRANIAL NERVES: CN II: Visual fields are full to confrontation. Fundoscopic exam is normal with sharp discs and no vascular changes. Pupils are round equal and briskly reactive to light. CN III, IV, VI: extraocular movement are normal. No ptosis. CN V: Facial sensation is intact to pinprick in all 3 divisions  bilaterally. Corneal responses are intact.  CN VII: Face is symmetric with normal eye closure and smile. CN VIII: Hearing is normal to rubbing fingers CN IX, X: Palate elevates symmetrically. Phonation is normal. CN XI: Head turning and shoulder shrug are intact CN XII: Tongue is midline with normal movements and no atrophy.  MOTOR: There is no pronator drift of out-stretched arms. Muscle bulk and tone are normal. Muscle strength is normal.  REFLEXES: Reflexes are 2+ and symmetric at the biceps, triceps,  1 at bilateral knees, and absent at  ankles. Plantar responses are flexor.  SENSORY: Mildly length dependent decreased light touch pinprick to ankle level  COORDINATION: Rapid alternating movements and fine finger movements are intact. There is no dysmetria on finger-to-nose and heel-knee-shin.    GAIT/STANCE: Need to push up to get up from seated position, mildly antalgic DIAGNOSTIC DATA (LABS, IMAGING, TESTING) - I reviewed patient records, labs, notes, testing and imaging myself where available.   ASSESSMENT AND PLAN  PROSPER PAFF is a 61 y.o. male    Diabetic peripheral neuropathy Presyncope, dizziness, falling episodes Multifactorial, this includes poorly controlled diabetes, A1c was 8.7 in May 2018, dehydration, deconditioning, polypharmacy treatment  I have advised him to continue tight control of his diabetes, document glucose, keep well hydration, counteractive maneuver, moderate exercise  Chronic migraine headaches,   Has helped by Depakote ER 500 mg every night  Excedrin Migraine as needed New-onset worsening confusion,  This could due to his most recent infection, polypharmacy treatment,  Proceed with EEG, laboratory evaluation including TSH B12, drug screen, I will call her report,   I have discussed  with patient, he has appointment scheduled with [facility] neurologist Dr. Posey Pronto on December 01 2016, will continue follow-up with her office,   Marcial Pacas, M.D.  Ph.D.  [facility] [address] [place], [place] [address] Ph: [phone] Fax: [phone]  CC: [name], MD

## 2016-09-18 ENCOUNTER — Ambulatory Visit: Payer: BLUE CROSS/BLUE SHIELD | Admitting: Neurology

## 2016-09-18 ENCOUNTER — Ambulatory Visit: Payer: BLUE CROSS/BLUE SHIELD | Admitting: Sports Medicine

## 2016-09-22 DIAGNOSIS — M869 Osteomyelitis, unspecified: Secondary | ICD-10-CM | POA: Insufficient documentation

## 2016-09-23 ENCOUNTER — Other Ambulatory Visit: Payer: Self-pay | Admitting: Emergency Medicine

## 2016-09-23 ENCOUNTER — Other Ambulatory Visit: Payer: Self-pay | Admitting: Family Medicine

## 2016-09-23 ENCOUNTER — Other Ambulatory Visit: Payer: Self-pay | Admitting: Internal Medicine

## 2016-09-23 DIAGNOSIS — I82409 Acute embolism and thrombosis of unspecified deep veins of unspecified lower extremity: Secondary | ICD-10-CM

## 2016-09-25 ENCOUNTER — Ambulatory Visit: Payer: BLUE CROSS/BLUE SHIELD | Admitting: Sports Medicine

## 2016-09-25 ENCOUNTER — Other Ambulatory Visit: Payer: BLUE CROSS/BLUE SHIELD

## 2016-09-30 ENCOUNTER — Ambulatory Visit (INDEPENDENT_AMBULATORY_CARE_PROVIDER_SITE_OTHER): Payer: BLUE CROSS/BLUE SHIELD

## 2016-09-30 ENCOUNTER — Ambulatory Visit (INDEPENDENT_AMBULATORY_CARE_PROVIDER_SITE_OTHER): Payer: BLUE CROSS/BLUE SHIELD | Admitting: Sports Medicine

## 2016-09-30 ENCOUNTER — Encounter: Payer: Self-pay | Admitting: Sports Medicine

## 2016-09-30 VITALS — BP 122/77 | HR 100 | Resp 16

## 2016-09-30 DIAGNOSIS — R0989 Other specified symptoms and signs involving the circulatory and respiratory systems: Secondary | ICD-10-CM | POA: Diagnosis not present

## 2016-09-30 DIAGNOSIS — M2141 Flat foot [pes planus] (acquired), right foot: Secondary | ICD-10-CM | POA: Diagnosis not present

## 2016-09-30 DIAGNOSIS — E1149 Type 2 diabetes mellitus with other diabetic neurological complication: Secondary | ICD-10-CM

## 2016-09-30 DIAGNOSIS — Z89411 Acquired absence of right great toe: Secondary | ICD-10-CM

## 2016-09-30 DIAGNOSIS — E1140 Type 2 diabetes mellitus with diabetic neuropathy, unspecified: Secondary | ICD-10-CM | POA: Diagnosis not present

## 2016-09-30 DIAGNOSIS — M2142 Flat foot [pes planus] (acquired), left foot: Secondary | ICD-10-CM

## 2016-09-30 DIAGNOSIS — I739 Peripheral vascular disease, unspecified: Secondary | ICD-10-CM | POA: Diagnosis not present

## 2016-09-30 DIAGNOSIS — L84 Corns and callosities: Secondary | ICD-10-CM

## 2016-09-30 NOTE — Progress Notes (Signed)
Subjective: Daniel [name] is a 61 y.o. male patient seen today in office for follow up evaluation of right foot, s/p Right hallux amputation done at [facility]. Patient finished IV antibiotics. Patient reports saw the doctor who did his amputation on yesterday who reports that its time for shoes. Patient denies any other issues.   Patient Active Problem List   Diagnosis Date Noted  . Confusion 09/17/2016  . Chronic migraine 09/17/2016  . Gait abnormality 09/17/2016  . Diabetic foot infection ([place]) 06/28/2016  . Screening examination for venereal disease 02/11/2016  . Critical lower limb ischemia 12/07/2015  . Fall 12/05/2015  . Toe ulcer, right ([place]) 09/19/2015  . Decreased pedal pulses 09/19/2015  . OSA on CPAP 09/05/2015  . Major depressive disorder, recurrent episode, moderate ([place]) 09/05/2015  . OSA (obstructive sleep apnea) 07/25/2015  . Passed out 06/28/2015  . Low back pain 06/11/2015  . Abnormality of gait 06/11/2015  . Dizziness 03/17/2015  . Weakness 02/21/2015  . Chronic renal insufficiency, stage III (moderate) 08/09/2014  . Diabetes ([place]) 11/07/2013  . Hematuria 06/21/2013  . Hepatic steatosis 09/09/2010  . UTI 09/15/2006  . PYURIA 09/03/2006  . Human immunodeficiency virus (HIV) disease ([place]) 06/04/2006  . HERPES ZOSTER, UNCOMPLICATED 20/94/7096  . HSV 06/04/2006  . Depression 06/04/2006  . DISORDER, ATTENTION DEFICIT W/HYPERACTIVITY 06/04/2006  . THROMBOPHLEBITIS NOS 06/04/2006  . GERD 06/04/2006  . ARTHRITIS, HAND 06/04/2006  . HYPERGLYCEMIA, HX OF 06/04/2006  . HEPATITIS B, HX OF 06/04/2006    Current Outpatient Prescriptions on File Prior to Visit  Medication Sig Dispense Refill  . acetaminophen (TYLENOL) 500 MG tablet Take 1,000 mg by mouth every 8 (eight) hours as needed for moderate pain.     [name] ALPRAZolam (XANAX) 1 MG tablet Take 1 mg by mouth 3 (three) times daily as needed for anxiety.     [name] amphetamine-dextroamphetamine (ADDERALL) 30 MG tablet  Take 30 mg by mouth 3 (three) times daily.     [name] atorvastatin (LIPITOR) 10 MG tablet TAKE ONE TABLET BY MOUTH DAILY. 90 tablet 0  . DAPTOmycin (CUBICIN) 500 MG injection Inject into the vein.    [name] diclofenac sodium (VOLTAREN) 1 % GEL APPLY 2 GRAMS TO EACH KNEE IN THE MORNING AND AT BEDTIME AND APPLY 1 GRAM TO EACH KNEE IN THE AFTERNOON. 300 g 0  . divalproex (DEPAKOTE ER) 500 MG 24 hr tablet Take 1 tablet (500 mg total) by mouth at bedtime. 90 tablet 4  . heparin flush 10 UNIT/ML SOLN injection 10 Units once.    . insulin aspart (NOVOLOG FLEXPEN) 100 UNIT/ML FlexPen 3 times a day (just before each meal) 06-28-13 units (Patient taking differently: Inject 10-15 Units into the skin 3 (three) times daily with meals. 3 times a day (just before each meal) 12-03-13 units) 15 mL 11  . Insulin Glargine (LANTUS SOLOSTAR) 100 UNIT/ML Solostar Pen Inject 65 Units into the skin every morning. 10 pen PRN  . Insulin Pen Needle (PEN NEEDLES 3/16") 31G X 5 MM MISC 1 Device by Other route 4 (four) times daily. Use to inject Lantus daily. 120 each 11  . levothyroxine (SYNTHROID, LEVOTHROID) 50 MCG tablet Take 1 tablet (50 mcg total) by mouth at bedtime. 30 tablet 11  . LYRICA 75 MG capsule TAKE ONE CAPSULE BY MOUTH THREE TIMES DAILY. 90 capsule 5  . ondansetron (ZOFRAN-ODT) 4 MG disintegrating tablet Take 2 tablets (8 mg total) by mouth every 8 (eight) hours as needed for nausea or vomiting. Almond  tablet 5  . protriptyline (VIVACTIL) 10 MG tablet Take 10 mg by mouth 3 (three) times daily.   11  . ranitidine (ZANTAC) 150 MG tablet Take 150 mg by mouth daily.    . rivaroxaban (XARELTO) 15 MG TABS tablet Take 1 tablet (15 mg total) by mouth daily. 30 tablet 11  . sodium chloride 0.9 % injection Inject into the vein as needed.    . TRINTELLIX 20 MG TABS Take 20 mg by mouth at bedtime.    . TRIUMEQ 600-50-300 MG tablet TAKE 1 TABLET BY MOUTH DAILY 30 tablet 6  . TRIUMEQ 600-50-300 MG tablet TAKE 1 TABLET BY MOUTH EVERY DAY 30  tablet 4  . UNABLE TO FIND CPAP MACHINE with standard Aclaim nasal mask with humidifier. Set at 14 cwp 1 each 0  . zolpidem (AMBIEN) 10 MG tablet Take 10-20 mg by mouth at bedtime as needed for sleep.      No current facility-administered medications on file prior to visit.     Allergies  Allergen Reactions  . Aspirin Swelling  . Ibuprofen Swelling  . Sustiva [Efavirenz] Swelling and Rash    And rash.  . Nsaids Other (See Comments)    unknwn    Objective: There were no vitals filed for this visit.  General: No acute distress, AAOx3  Right foot: There is mild gapping at the proximal area of the surgical hallux amputation site, moderate swelling to right forefoot, blanchable erythema, decreased warmth, no drainage, no other signs of infection noted, Capillary fill time <5 seconds in all remaining toes on right, protective sensation diminished on right, No pain or crepitation with range of motion right foot. No pain with calf compression.  Left foot: Pre-ulcerative callus left hallux with minimal keratosis and no signs of infection. Dry skin. No other acute findings. Pes planus foot type bilateral.   Assessment and Plan:  Problem List Items Addressed This Visit    None    Visit Diagnoses    Status post amputation of right great toe ([place])    -  Primary   Relevant Orders   DG Foot Complete Right (Completed)   Diabetic neuropathy with neurologic complication ([name])       PAD (peripheral artery disease) ([name])       Diminished pulses in lower extremity       Pre-ulcerative calluses       Pes planus of both feet           -Patient seen and evaluated -Xrays no acute findings, consistent with right hallux amputation with post op swelling -Applied betadine and dry sterile dressing to surgical site right foot; advised patient to do the same at home until healed  -Advised patient to continue with post-op shoe on right foot until diabetic shoes have arrived. Completed safe step paperwork  and sent to Dr. Carlota Raspberry for certification. Patient was molded and picked out shoes at today's visit.  -Advised patient to limit activity to necessity  -Advised patient to elevate for edema control   -Will plan for dispensing of shoes and incision site check on right at next office visit.. In the meantime, patient to call office if any issues or problems arise.   Landis Martins, DPM

## 2016-10-02 ENCOUNTER — Telehealth: Payer: Self-pay | Admitting: Neurology

## 2016-10-02 ENCOUNTER — Telehealth: Payer: Self-pay | Admitting: Family Medicine

## 2016-10-02 LAB — DRUG SCREEN 10 W/CONF, SERUM
Amphetamines, IA: NEGATIVE ng/mL
BARBITURATES, IA: NEGATIVE ug/mL
BENZODIAZEPINES, IA: POSITIVE ng/mL — AB
COCAINE & METABOLITE, IA: NEGATIVE ng/mL
Methadone, IA: NEGATIVE ng/mL
OPIATES, IA: NEGATIVE ng/mL
Oxycodones, IA: NEGATIVE ng/mL
Phencyclidine, IA: NEGATIVE ng/mL
Propoxyphene, IA: NEGATIVE ng/mL
THC(MARIJUANA) METABOLITE, IA: NEGATIVE ng/mL

## 2016-10-02 LAB — THC,MS,WB/SP RFX
CARBOXY-THC: NEGATIVE ng/mL
Cannabidiol: NEGATIVE ng/mL
Cannabinoid Confirmation: NEGATIVE
Cannabinol: NEGATIVE ng/mL
Hydroxy-THC: NEGATIVE ng/mL
Tetrahydrocannabinol(THC): NEGATIVE ng/mL

## 2016-10-02 LAB — BENZODIAZEPINES,MS,WB/SP RFX
7-AMINOCLONAZEPAM: NEGATIVE ng/mL
Alprazolam: 22.6 ng/mL
Benzodiazepines Confirm: POSITIVE
Chlordiazepoxide: NEGATIVE ng/mL
Clonazepam: NEGATIVE ng/mL
DESALKYLFLURAZEPAM: NEGATIVE ng/mL
DESMETHYLCHLORDIAZEPOXIDE: NEGATIVE ng/mL
DIAZEPAM: NEGATIVE ng/mL
Desmethyldiazepam: NEGATIVE ng/mL
FLURAZEPAM: NEGATIVE ng/mL
LORAZEPAM: NEGATIVE ng/mL
Midazolam: NEGATIVE ng/mL
Oxazepam: NEGATIVE ng/mL
Temazepam: NEGATIVE ng/mL
Triazolam: NEGATIVE ng/mL

## 2016-10-02 LAB — TSH: TSH: 0.736 u[IU]/mL (ref 0.450–4.500)

## 2016-10-02 LAB — VITAMIN B12: Vitamin B-12: 528 pg/mL (ref 232–1245)

## 2016-10-02 NOTE — Telephone Encounter (Signed)
Pt called requesting referral to [facility] ENT for Vertigo. Please advise. Pt can be reached at [phone]. Thanks.

## 2016-10-02 NOTE — Telephone Encounter (Signed)
Please call patient laboratory evaluation show no significant abnormality,

## 2016-10-03 NOTE — Telephone Encounter (Signed)
LMVM for pt (ok DPR mobile)  that lab results showed no significant abnormalities.  He is to call back if questions.

## 2016-10-03 NOTE — Telephone Encounter (Signed)
More info please. Is he having new vertigo symptoms? He had other neurologic symptoms with falls and weakness at last visit and recommended he follow-up with neurology. I do not see a recent office visit with them.  If he is having unsteadiness/dizziness, would recommend evaluation with neurology first, and if they felt he needed to see ear nose and throat, can proceed without referral. If he has having NEW dizziness or unsteadiness, would recommend evaluation in our office or emergency room if needed.

## 2016-10-07 ENCOUNTER — Other Ambulatory Visit: Payer: BLUE CROSS/BLUE SHIELD

## 2016-10-07 ENCOUNTER — Ambulatory Visit: Payer: BLUE CROSS/BLUE SHIELD | Admitting: Internal Medicine

## 2016-10-07 NOTE — Telephone Encounter (Signed)
Left message to return call 

## 2016-10-07 NOTE — Telephone Encounter (Signed)
Spoke with patient about vertigo symptoms. States, he is not dizzy but noticed some coordination issues with progressive leaning. Denies falls.  Pt is scheduled to see [name] tomorrow. Advised to kep appointment and Dr. Carlota Raspberry will f/u with neurologist post visit.

## 2016-10-08 ENCOUNTER — Ambulatory Visit (INDEPENDENT_AMBULATORY_CARE_PROVIDER_SITE_OTHER): Payer: BLUE CROSS/BLUE SHIELD | Admitting: Neurology

## 2016-10-08 DIAGNOSIS — Z794 Long term (current) use of insulin: Secondary | ICD-10-CM

## 2016-10-08 DIAGNOSIS — R410 Disorientation, unspecified: Secondary | ICD-10-CM

## 2016-10-08 DIAGNOSIS — E0849 Diabetes mellitus due to underlying condition with other diabetic neurological complication: Secondary | ICD-10-CM

## 2016-10-10 ENCOUNTER — Other Ambulatory Visit: Payer: Self-pay | Admitting: Family Medicine

## 2016-10-10 ENCOUNTER — Encounter: Payer: Self-pay | Admitting: Endocrinology

## 2016-10-10 ENCOUNTER — Ambulatory Visit (INDEPENDENT_AMBULATORY_CARE_PROVIDER_SITE_OTHER): Payer: BLUE CROSS/BLUE SHIELD | Admitting: Endocrinology

## 2016-10-10 VITALS — BP 150/102 | HR 117 | Wt 242.4 lb

## 2016-10-10 DIAGNOSIS — E0849 Diabetes mellitus due to underlying condition with other diabetic neurological complication: Secondary | ICD-10-CM | POA: Diagnosis not present

## 2016-10-10 DIAGNOSIS — Z794 Long term (current) use of insulin: Secondary | ICD-10-CM

## 2016-10-10 DIAGNOSIS — I82409 Acute embolism and thrombosis of unspecified deep veins of unspecified lower extremity: Secondary | ICD-10-CM

## 2016-10-10 LAB — POCT GLYCOSYLATED HEMOGLOBIN (HGB A1C): Hemoglobin A1C: 7.4

## 2016-10-10 MED ORDER — INSULIN GLARGINE 100 UNIT/ML SOLOSTAR PEN: 75.0000 [IU] | PEN_INJECTOR | SUBCUTANEOUS | 99 refills | Status: DC

## 2016-10-10 MED ORDER — INSULIN ASPART 100 UNIT/ML FLEXPEN: PEN_INJECTOR | SUBCUTANEOUS | 11 refills | Status: DC

## 2016-10-10 NOTE — Progress Notes (Signed)
Subjective:    Patient ID: Daniel [name], male    DOB: Dec 25, 1955, 61 y.o.   MRN: [id number]  HPI Pt returns for f/u of diabetes mellitus: DM type: Insulin-requiring type 2.  Dx'ed: 0160 Complications: polyneuropathy, PAD, renal insufficiency, and foot ulcers.  Therapy: insulin since 2016. DKA: never Severe hypoglycemia: never.  Pancreatitis: never.  Other: he takes multiple daily injections, but basal insulin is emphasized.   Interval history: He brings a record of his cbg's which I have reviewed today.  It varies from 92-400.  There is no trend throughout the day.  He says the variation is due to variable meals. He increased lantus to 75 units qam.  Past Medical History:  Diagnosis Date  . ADHD (attention deficit hyperactivity disorder)   . Anxiety   . Chronic kidney disease   . Clotting disorder ([place])   . Depression   . Diabetes mellitus without complication ([place])   . Diabetes mellitus, type II ([place])   . Dizziness 03/17/2015  . GERD (gastroesophageal reflux disease)   . HIV disease ([place])   . HIV infection ([place])   . Liver disease   . OSA (obstructive sleep apnea) 07/25/2015   Uses CPAP regularly  . Peripheral vascular disease ([place])   . Ulcer     Past Surgical History:  Procedure Laterality Date  . SMALL INTESTINE SURGERY    . STOMACH SURGERY      Social History   Social History  . Marital status: Single    Spouse name: N/A  . Number of children: N/A  . Years of education: N/A   Occupational History  . Not on file.   Social History Main Topics  . Smoking status: Former Smoker    Packs/day: 0.10    Years: 10.00    Types: Cigars, Cigarettes    Quit date: 08/09/2014  . Smokeless tobacco: Never Used  . Alcohol use No  . Drug use: No  . Sexual activity: Not Currently    Partners: Male     Comment: pt. declined condoms   Other Topics Concern  . Not on file   Social History Narrative   Epworth Sleepiness Scale = 7 (as of 03/16/2015)    Current  Outpatient Prescriptions on File Prior to Visit  Medication Sig Dispense Refill  . acetaminophen (TYLENOL) 500 MG tablet Take 1,000 mg by mouth every 8 (eight) hours as needed for moderate pain.     [name] ALPRAZolam (XANAX) 1 MG tablet Take 1 mg by mouth 3 (three) times daily as needed for anxiety.     [name] amphetamine-dextroamphetamine (ADDERALL) 30 MG tablet Take 30 mg by mouth 3 (three) times daily.     [name] atorvastatin (LIPITOR) 10 MG tablet TAKE ONE TABLET BY MOUTH DAILY. 90 tablet 0  . diclofenac sodium (VOLTAREN) 1 % GEL APPLY 2 GRAMS TO EACH KNEE IN THE MORNING AND AT BEDTIME AND APPLY 1 GRAM TO EACH KNEE IN THE AFTERNOON. 300 g 0  . divalproex (DEPAKOTE ER) 500 MG 24 hr tablet Take 1 tablet (500 mg total) by mouth at bedtime. 90 tablet 4  . Insulin Pen Needle (PEN NEEDLES 3/16") 31G X 5 MM MISC 1 Device by Other route 4 (four) times daily. Use to inject Lantus daily. 120 each 11  . levothyroxine (SYNTHROID, LEVOTHROID) 50 MCG tablet Take 1 tablet (50 mcg total) by mouth at bedtime. 30 tablet 11  . LYRICA 75 MG capsule TAKE ONE CAPSULE BY MOUTH THREE TIMES DAILY. 90 capsule  5  . ondansetron (ZOFRAN-ODT) 4 MG disintegrating tablet Take 2 tablets (8 mg total) by mouth every 8 (eight) hours as needed for nausea or vomiting. 60 tablet 5  . protriptyline (VIVACTIL) 10 MG tablet Take 10 mg by mouth 3 (three) times daily.   11  . ranitidine (ZANTAC) 150 MG tablet Take 150 mg by mouth daily.    . rivaroxaban (XARELTO) 15 MG TABS tablet Take 1 tablet (15 mg total) by mouth daily. 30 tablet 11  . TRINTELLIX 20 MG TABS Take 20 mg by mouth at bedtime.    . TRIUMEQ 600-50-300 MG tablet TAKE 1 TABLET BY MOUTH DAILY 30 tablet 6  . TRIUMEQ 600-50-300 MG tablet TAKE 1 TABLET BY MOUTH EVERY DAY 30 tablet 4  . UNABLE TO FIND CPAP MACHINE with standard Aclaim nasal mask with humidifier. Set at 14 cwp 1 each 0  . zolpidem (AMBIEN) 10 MG tablet Take 10-20 mg by mouth at bedtime as needed for sleep.     [name] DAPTOmycin  (CUBICIN) 500 MG injection Inject into the vein.    . heparin flush 10 UNIT/ML SOLN injection 10 Units once.    . sodium chloride 0.9 % injection Inject into the vein as needed.     No current facility-administered medications on file prior to visit.     Allergies  Allergen Reactions  . Aspirin Swelling  . Ibuprofen Swelling  . Sustiva [Efavirenz] Swelling and Rash    And rash.  . Nsaids Other (See Comments)    unknwn    Family History  Problem Relation Age of Onset  . Cancer Brother   . Depression Brother   . COPD Mother   . Diabetes Neg Hx     BP (!) 150/102   Pulse (!) 117   Wt 242 lb 6.4 oz (110 kg)   SpO2 98%   BMI 34.29 kg/m    Review of Systems He denies hypoglycemia.     Objective:   Physical Exam VITAL SIGNS:  See vs page.  GENERAL: no distress. Pulses: foot pulses are intact bilaterally.   MSK: no deformity of the feet or ankles, except right great toe is absent.  CV: trace bilat edema of the legs  Skin:  Right foot ulcer is almost completely healed.  normal color and temp on the feet and ankles Neuro: sensation is intact to touch on the feet and ankles, but decreased from normal. There is bilateral onychomycosis of the toenails.   Lab Results  Component Value Date   HGBA1C 7.4 10/10/2016       Assessment & Plan:  Insulin-requiring type 2 DM, with PAD: therapy limited by variable cbg's.     Patient Instructions  check your blood sugar twice a day.  vary the time of day when you check, between before the 3 meals, and at bedtime.  also check if you have symptoms of your blood sugar being too high or too low.  please keep a record of the readings and bring it to your next appointment here (or you can bring the meter itself).  You can write it on any piece of paper.  please call us sooner if your blood sugar goes below 70, or if you have a lot of readings over 200.   For now, please:  continue the lantus, 75 units each morning, and:  continue the  same novolog to 3 times a day (just before each meal) 06-28-13 units.  Please come back for a follow-up appointment  in 3-4 months.

## 2016-10-10 NOTE — Patient Instructions (Addendum)
check your blood sugar twice a day.  vary the time of day when you check, between before the 3 meals, and at bedtime.  also check if you have symptoms of your blood sugar being too high or too low.  please keep a record of the readings and bring it to your next appointment here (or you can bring the meter itself).  You can write it on any piece of paper.  please call us sooner if your blood sugar goes below 70, or if you have a lot of readings over 200.   For now, please:  continue the lantus, 75 units each morning, and:  continue the same novolog to 3 times a day (just before each meal) 06-28-13 units.  Please come back for a follow-up appointment in 3-4 months.

## 2016-10-10 NOTE — Procedures (Signed)
   HISTORY: 61 year old male, with history of peripheral neuropathy, frequent dizzy, passing out episode  TECHNIQUE:  16 channel EEG was performed based on standard 10-16 international system. One channel was dedicated to EKG, which has demonstrates rapid regular heart rate of 108 beats per minutes.  Upon awakening, the posterior background activity was well-developed, in alpha range, 9Hz , reactive to eye opening and closure.  There was no evidence of epileptiform discharge.  Photic stimulation was performed, which induced a symmetric photic driving.  Hyperventilation was performed, there was no abnormality elicit.  No sleep was achieved.  CONCLUSION: This is a  normal awake EEG.  There is no electrodiagnostic evidence of epileptiform discharge.  Marcial Pacas, M.D. Ph.D.  [facility] [place], [place] [address] Phone: [phone] Fax:      [phone]

## 2016-10-15 ENCOUNTER — Other Ambulatory Visit: Payer: Self-pay | Admitting: Family Medicine

## 2016-10-20 ENCOUNTER — Other Ambulatory Visit: Payer: Self-pay | Admitting: *Deleted

## 2016-10-20 MED ORDER — ATORVASTATIN CALCIUM 10 MG PO TABS: 10.0000 mg | ORAL_TABLET | Freq: Every day | ORAL | 0 refills | Status: DC

## 2016-10-21 ENCOUNTER — Ambulatory Visit: Payer: BLUE CROSS/BLUE SHIELD | Admitting: Internal Medicine

## 2016-10-21 ENCOUNTER — Encounter: Payer: Self-pay | Admitting: Neurology

## 2016-10-21 ENCOUNTER — Telehealth: Payer: Self-pay | Admitting: Neurology

## 2016-10-21 NOTE — Telephone Encounter (Signed)
Pt calling re: his vertigo, he said that his PCP advised him to contact his Neurologist (Dr [name]) to request a referrral to a [facility], please call

## 2016-10-22 NOTE — Telephone Encounter (Addendum)
He is requesting to be seen for dizziness.  He would like to be seen by [name].  I inquired about his pending appt with Dr. Posey Pronto at [facility] and says he is unsure why he has that appt. I reviewed his last office visit notes and reminded him this referral had been discussed with [name].  He does not remember requesting the referral.  States he is going to cancel it and would like to continue care with [name].  An appt has been scheduled for him on 10/23/16.

## 2016-10-23 ENCOUNTER — Encounter: Payer: Self-pay | Admitting: Neurology

## 2016-10-23 ENCOUNTER — Ambulatory Visit (INDEPENDENT_AMBULATORY_CARE_PROVIDER_SITE_OTHER): Payer: BLUE CROSS/BLUE SHIELD | Admitting: Neurology

## 2016-10-23 VITALS — BP 141/85 | HR 105 | Ht 70.5 in | Wt 247.0 lb

## 2016-10-23 DIAGNOSIS — W19XXXA Unspecified fall, initial encounter: Secondary | ICD-10-CM

## 2016-10-23 DIAGNOSIS — R269 Unspecified abnormalities of gait and mobility: Secondary | ICD-10-CM

## 2016-10-23 DIAGNOSIS — E11628 Type 2 diabetes mellitus with other skin complications: Secondary | ICD-10-CM | POA: Diagnosis not present

## 2016-10-23 DIAGNOSIS — L089 Local infection of the skin and subcutaneous tissue, unspecified: Secondary | ICD-10-CM

## 2016-10-23 DIAGNOSIS — R410 Disorientation, unspecified: Secondary | ICD-10-CM

## 2016-10-23 DIAGNOSIS — R42 Dizziness and giddiness: Secondary | ICD-10-CM
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
## 2016-10-28 ENCOUNTER — Ambulatory Visit: Payer: BLUE CROSS/BLUE SHIELD | Admitting: Sports Medicine

## 2016-10-31 ENCOUNTER — Telehealth: Payer: Self-pay | Admitting: Sports Medicine

## 2016-10-31 NOTE — Telephone Encounter (Signed)
Left message for pt to call to discuss benefits for the diabetic shoes and special inserts that Dr [name] wanted pt to get.

## 2016-11-03 ENCOUNTER — Ambulatory Visit: Payer: BLUE CROSS/BLUE SHIELD | Admitting: Neurology

## 2016-11-03 NOTE — Telephone Encounter (Signed)
Pt returned call and when I tried to schedule him to see Liliane Channel to pick out his shoes and get measured he said he had already done this. I have the foam box and the diabetic shoe order form but none of this information is on this form. I tried to explain and pt said he was very persistent that it was already done. I told him I would talk to Dr [name] tomorrow and if we needed to we would get the information at his appt on 9.18.18

## 2016-11-11 ENCOUNTER — Ambulatory Visit (INDEPENDENT_AMBULATORY_CARE_PROVIDER_SITE_OTHER): Payer: BLUE CROSS/BLUE SHIELD | Admitting: Sports Medicine

## 2016-11-11 ENCOUNTER — Encounter: Payer: Self-pay | Admitting: Sports Medicine

## 2016-11-11 DIAGNOSIS — S90424A Blister (nonthermal), right lesser toe(s), initial encounter: Secondary | ICD-10-CM

## 2016-11-11 DIAGNOSIS — E1149 Type 2 diabetes mellitus with other diabetic neurological complication: Secondary | ICD-10-CM

## 2016-11-11 DIAGNOSIS — B351 Tinea unguium: Secondary | ICD-10-CM | POA: Diagnosis not present

## 2016-11-11 DIAGNOSIS — E1140 Type 2 diabetes mellitus with diabetic neuropathy, unspecified: Secondary | ICD-10-CM

## 2016-11-11 DIAGNOSIS — I739 Peripheral vascular disease, unspecified: Secondary | ICD-10-CM

## 2016-11-11 NOTE — Progress Notes (Signed)
Subjective: Daniel [name] is a 61 y.o. male patient seen today in office for follow up evaluation of right foot, s/p Right hallux amputation done at [facility] and blister to 2nd toe that started 10 days ago. Patient reports that he bumped toe and developed a blister that he drained and put betadine on. States that he is also concerned about thick yellow nails and wants to discuss treatment for his nails. Patient denies any other issues.   Patient Active Problem List   Diagnosis Date Noted  . Confusion 09/17/2016  . Chronic migraine 09/17/2016  . Gait abnormality 09/17/2016  . Diabetic foot infection ([place]) 06/28/2016  . Screening examination for venereal disease 02/11/2016  . Critical lower limb ischemia 12/07/2015  . Fall 12/05/2015  . Toe ulcer, right ([place]) 09/19/2015  . Decreased pedal pulses 09/19/2015  . OSA on CPAP 09/05/2015  . Major depressive disorder, recurrent episode, moderate ([place]) 09/05/2015  . OSA (obstructive sleep apnea) 07/25/2015  . Passed out 06/28/2015  . Low back pain 06/11/2015  . Abnormality of gait 06/11/2015  . Dizziness 03/17/2015  . Weakness 02/21/2015  . Chronic renal insufficiency, stage III (moderate) 08/09/2014  . Diabetes ([place]) 11/07/2013  . Hematuria 06/21/2013  . Hepatic steatosis 09/09/2010  . UTI 09/15/2006  . PYURIA 09/03/2006  . Human immunodeficiency virus (HIV) disease ([place]) 06/04/2006  . HERPES ZOSTER, UNCOMPLICATED 38/45/3646  . HSV 06/04/2006  . Depression 06/04/2006  . DISORDER, ATTENTION DEFICIT W/HYPERACTIVITY 06/04/2006  . THROMBOPHLEBITIS NOS 06/04/2006  . GERD 06/04/2006  . ARTHRITIS, HAND 06/04/2006  . HYPERGLYCEMIA, HX OF 06/04/2006  . HEPATITIS B, HX OF 06/04/2006    Current Outpatient Prescriptions on File Prior to Visit  Medication Sig Dispense Refill  . acetaminophen (TYLENOL) 500 MG tablet Take 1,000 mg by mouth every 8 (eight) hours as needed for moderate pain.     [name] ALPRAZolam (XANAX) 1 MG tablet Take 1 mg by  mouth 3 (three) times daily as needed for anxiety.     [name] amphetamine-dextroamphetamine (ADDERALL) 30 MG tablet Take 30 mg by mouth 3 (three) times daily.     [name] atorvastatin (LIPITOR) 10 MG tablet Take 1 tablet (10 mg total) by mouth daily. 90 tablet 0  . diclofenac sodium (VOLTAREN) 1 % GEL APPLY 2 GRAMS TO EACH KNEE IN THE MORNING AND AT BEDTIME AND APPLY 1 GRAM TO EACH KNEE IN THE AFTERNOON. 300 g 0  . divalproex (DEPAKOTE ER) 500 MG 24 hr tablet Take 1 tablet (500 mg total) by mouth at bedtime. 90 tablet 4  . insulin aspart (NOVOLOG FLEXPEN) 100 UNIT/ML FlexPen 3 times a day (just before each meal) 06-28-13 units 15 mL 11  . Insulin Glargine (LANTUS SOLOSTAR) 100 UNIT/ML Solostar Pen Inject 75 Units into the skin every morning. 10 pen PRN  . Insulin Pen Needle (PEN NEEDLES 3/16") 31G X 5 MM MISC 1 Device by Other route 4 (four) times daily. Use to inject Lantus daily. 120 each 11  . levothyroxine (SYNTHROID, LEVOTHROID) 50 MCG tablet Take 1 tablet (50 mcg total) by mouth at bedtime. 30 tablet 11  . LYRICA 75 MG capsule TAKE ONE CAPSULE BY MOUTH THREE TIMES DAILY. 90 capsule 5  . ondansetron (ZOFRAN-ODT) 4 MG disintegrating tablet Take 2 tablets (8 mg total) by mouth every 8 (eight) hours as needed for nausea or vomiting. 60 tablet 5  . protriptyline (VIVACTIL) 10 MG tablet Take 10 mg by mouth 3 (three) times daily.   11  . ranitidine (ZANTAC)  150 MG tablet Take 150 mg by mouth daily.    . TRINTELLIX 20 MG TABS Take 20 mg by mouth at bedtime.    . TRIUMEQ 600-50-300 MG tablet TAKE 1 TABLET BY MOUTH DAILY 30 tablet 6  . UNABLE TO FIND CPAP MACHINE with standard Aclaim nasal mask with humidifier. Set at 14 cwp 1 each 0  . XARELTO 15 MG TABS tablet TAKE ONE TABLET BY MOUTH DAILY. 30 tablet 0  . zolpidem (AMBIEN) 10 MG tablet Take 10-20 mg by mouth at bedtime as needed for sleep.      No current facility-administered medications on file prior to visit.     Allergies  Allergen Reactions  . Aspirin  Swelling  . Ibuprofen Swelling  . Sustiva [Efavirenz] Swelling and Rash    And rash.  . Nsaids Other (See Comments)    unknwn    Objective: There were no vitals filed for this visit.  General: No acute distress, AAOx3   Right foot: Incision well healed at surgical hallux amputation site, moderate swelling to right forefoot, blanchable erythema, decreased warmth, no drainage, There is a dry blood blister and a loose 2nd toenail on right with no other signs of infection noted, nails 3-5 on right are thick yellow and mycotic with no signs of infection, Capillary fill time <5 seconds in all remaining toes on right, protective sensation diminished on right, No pain or crepitation with range of motion right foot. No pain with calf compression.   Left foot: Pre-ulcerative callus left hallux with minimal keratosis and no signs of infection. Dry skin. Nails 2-5 on left are thick yellow and mycotic with no signs of infection, No other acute findings.  Pes planus foot type bilateral.   Assessment and Plan:  Problem List Items Addressed This Visit    None    Visit Diagnoses    Dermatophytosis of nail    -  Primary   Relevant Orders   Culture, fungus without smear   Diabetic neuropathy with neurologic complication ([name])       Toe blister without infection, right, initial encounter       Dry blood blister   PAD (peripheral artery disease) ([place])         -Patient seen and evaluated -Fungal nail culture was obtained from nails 2-5 on left and 3-5 on right and sent to [facility] for fungal culture -Applied betadine and dry sterile dressing to 2nd toe on right foot; advised patient to do the same at home until healed  -Advised patient to continue with post-op shoe on right foot until diabetic shoes have arrived. However patient is in normal shoes today; States that he has to wear normal shoes for court date that's coming up  -Advised patient to limit activity to necessity  -Advised patient to elevate  for edema control   -Will plan for dispensing of shoes/insoles and 2nd toe check on right and discussing fungal culture results if available at next office visit. In the meantime, patient to call office if any issues or problems arise.   Landis Martins, DPM

## 2016-11-13 ENCOUNTER — Other Ambulatory Visit: Payer: BLUE CROSS/BLUE SHIELD

## 2016-11-14 ENCOUNTER — Other Ambulatory Visit: Payer: Self-pay | Admitting: Family Medicine

## 2016-11-14 ENCOUNTER — Other Ambulatory Visit: Payer: Self-pay | Admitting: Physician Assistant

## 2016-11-14 DIAGNOSIS — I82409 Acute embolism and thrombosis of unspecified deep veins of unspecified lower extremity: Secondary | ICD-10-CM

## 2016-11-15 NOTE — Telephone Encounter (Signed)
Please advise 

## 2016-11-17 ENCOUNTER — Other Ambulatory Visit: Payer: BLUE CROSS/BLUE SHIELD

## 2016-11-18 DIAGNOSIS — R2689 Other abnormalities of gait and mobility: Secondary | ICD-10-CM | POA: Insufficient documentation

## 2016-11-24 ENCOUNTER — Telehealth: Payer: Self-pay | Admitting: Sports Medicine

## 2016-11-24 NOTE — Telephone Encounter (Signed)
Left voicemail for pt to call to schedule an appt to puds and insert with Rick.

## 2016-11-25 ENCOUNTER — Ambulatory Visit (INDEPENDENT_AMBULATORY_CARE_PROVIDER_SITE_OTHER): Payer: BLUE CROSS/BLUE SHIELD | Admitting: Sports Medicine

## 2016-11-25 ENCOUNTER — Other Ambulatory Visit (HOSPITAL_COMMUNITY)
Admission: RE | Admit: 2016-11-25 | Discharge: 2016-11-25 | Disposition: A | Discharge status: Home / Self Care | Payer: BLUE CROSS/BLUE SHIELD | Source: Ambulatory Visit | Attending: Internal Medicine | Admitting: Internal Medicine

## 2016-11-25 ENCOUNTER — Other Ambulatory Visit: Payer: BLUE CROSS/BLUE SHIELD | Admitting: Orthotics

## 2016-11-25 ENCOUNTER — Encounter: Payer: Self-pay | Admitting: Sports Medicine

## 2016-11-25 ENCOUNTER — Other Ambulatory Visit: Payer: BLUE CROSS/BLUE SHIELD

## 2016-11-25 VITALS — BP 159/94 | HR 104 | Resp 16

## 2016-11-25 DIAGNOSIS — I739 Peripheral vascular disease, unspecified: Secondary | ICD-10-CM

## 2016-11-25 DIAGNOSIS — Z89411 Acquired absence of right great toe: Secondary | ICD-10-CM

## 2016-11-25 DIAGNOSIS — B20 Human immunodeficiency virus [HIV] disease: Secondary | ICD-10-CM | POA: Diagnosis present

## 2016-11-25 DIAGNOSIS — L97511 Non-pressure chronic ulcer of other part of right foot limited to breakdown of skin: Secondary | ICD-10-CM | POA: Diagnosis not present

## 2016-11-25 DIAGNOSIS — E1149 Type 2 diabetes mellitus with other diabetic neurological complication: Secondary | ICD-10-CM | POA: Diagnosis not present

## 2016-11-25 DIAGNOSIS — E1140 Type 2 diabetes mellitus with diabetic neuropathy, unspecified: Secondary | ICD-10-CM | POA: Diagnosis not present

## 2016-11-25 NOTE — Progress Notes (Signed)
Subjective: Daniel [name] is a 61 y.o. male patient seen today in office for follow up evaluation of right foot, s/p Right hallux amputation done at [facility] and blister to 2nd toe, reports that he has been dressing it with betadine and that there area is drying up however there is still swelling to foot and to toe. Patient denies constitional symptoms. Patient denies any other issues.   A1c 7.4 on Xarelto and is awaiting refill on Lyrica by PCP  Patient Active Problem List   Diagnosis Date Noted  . Confusion 09/17/2016  . Chronic migraine 09/17/2016  . Gait abnormality 09/17/2016  . Diabetic foot infection ([place]) 06/28/2016  . Screening examination for venereal disease 02/11/2016  . Critical lower limb ischemia 12/07/2015  . Fall 12/05/2015  . Toe ulcer, right ([place]) 09/19/2015  . Decreased pedal pulses 09/19/2015  . OSA on CPAP 09/05/2015  . Major depressive disorder, recurrent episode, moderate ([place]) 09/05/2015  . OSA (obstructive sleep apnea) 07/25/2015  . Passed out [facility]) 06/28/2015  . Low back pain 06/11/2015  . Abnormality of gait 06/11/2015  . Dizziness 03/17/2015  . Weakness 02/21/2015  . Chronic renal insufficiency, stage III (moderate) ([place]) 08/09/2014  . Diabetes ([place]) 11/07/2013  . Hematuria 06/21/2013  . Hepatic steatosis 09/09/2010  . UTI 09/15/2006  . PYURIA 09/03/2006  . Human immunodeficiency virus (HIV) disease ([place]) 06/04/2006  . HERPES ZOSTER, UNCOMPLICATED 33/35/4562  . HSV 06/04/2006  . Depression 06/04/2006  . DISORDER, ATTENTION DEFICIT W/HYPERACTIVITY 06/04/2006  . THROMBOPHLEBITIS NOS 06/04/2006  . GERD 06/04/2006  . ARTHRITIS, HAND 06/04/2006  . HYPERGLYCEMIA, HX OF 06/04/2006  . HEPATITIS B, HX OF 06/04/2006    Current Outpatient Prescriptions on File Prior to Visit  Medication Sig Dispense Refill  . acetaminophen (TYLENOL) 500 MG tablet Take 1,000 mg by mouth every 8 (eight) hours as needed for moderate pain.     [name] ALPRAZolam (XANAX) 1 MG  tablet Take 1 mg by mouth 3 (three) times daily as needed for anxiety.     [name] amphetamine-dextroamphetamine (ADDERALL) 30 MG tablet Take 30 mg by mouth 3 (three) times daily.     [name] atorvastatin (LIPITOR) 10 MG tablet Take 1 tablet (10 mg total) by mouth daily. 90 tablet 0  . diclofenac sodium (VOLTAREN) 1 % GEL APPLY 2 GRAMS TO EACH KNEE IN THE MORNING AND AT BEDTIME AND APPLY 1 GRAM TO EACH KNEE IN THE AFTERNOON. 300 g 0  . divalproex (DEPAKOTE ER) 500 MG 24 hr tablet Take 1 tablet (500 mg total) by mouth at bedtime. 90 tablet 4  . insulin aspart (NOVOLOG FLEXPEN) 100 UNIT/ML FlexPen 3 times a day (just before each meal) 06-28-13 units 15 mL 11  . Insulin Glargine (LANTUS SOLOSTAR) 100 UNIT/ML Solostar Pen Inject 75 Units into the skin every morning. 10 pen PRN  . Insulin Pen Needle (PEN NEEDLES 3/16") 31G X 5 MM MISC 1 Device by Other route 4 (four) times daily. Use to inject Lantus daily. 120 each 11  . levothyroxine (SYNTHROID, LEVOTHROID) 50 MCG tablet Take 1 tablet (50 mcg total) by mouth at bedtime. 30 tablet 11  . LYRICA 75 MG capsule TAKE ONE CAPSULE BY MOUTH THREE TIMES DAILY. 90 capsule 5  . ondansetron (ZOFRAN-ODT) 4 MG disintegrating tablet Take 2 tablets (8 mg total) by mouth every 8 (eight) hours as needed for nausea or vomiting. 60 tablet 5  . protriptyline (VIVACTIL) 10 MG tablet Take 10 mg by mouth 3 (three) times daily.  11  . ranitidine (ZANTAC) 150 MG tablet Take 150 mg by mouth daily.    [name] REXULTI 1 MG TABS   98  . TRINTELLIX 20 MG TABS Take 20 mg by mouth at bedtime.    . TRIUMEQ 600-50-300 MG tablet TAKE 1 TABLET BY MOUTH DAILY 30 tablet 6  . UNABLE TO FIND CPAP MACHINE with standard Aclaim nasal mask with humidifier. Set at 14 cwp 1 each 0  . XARELTO 15 MG TABS tablet TAKE ONE TABLET BY MOUTH DAILY. 30 tablet 0  . zolpidem (AMBIEN) 10 MG tablet Take 10-20 mg by mouth at bedtime as needed for sleep.      No current facility-administered medications on file prior to visit.      Allergies  Allergen Reactions  . Aspirin Swelling  . Ibuprofen Swelling  . Sustiva [Efavirenz] Swelling and Rash    And rash.  . Nsaids Other (See Comments)    unknwn    Objective: There were no vitals filed for this visit.  General: No acute distress, AAOx3   Right foot: Incision well healed at surgical hallux amputation site, moderate swelling to right forefoot, blanchable erythema, decreased warmth, no drainage, There is a partial thickness ulceration at distal 2nd toe on right that measures 0.5x0.5cm with dry keratotic skin from previous blister with no other signs of infection noted, nails 3-5 on right are thick yellow and mycotic with no signs of infection, Capillary fill time <5 seconds in all remaining toes on right, protective sensation diminished on right, No pain or crepitation with range of motion right foot. No pain with calf compression.   Left foot: Pre-ulcerative callus left hallux with minimal keratosis and no signs of infection. Dry skin. Nails 2-5 on left are thick yellow and mycotic with no signs of infection, No other acute findings.  Pes planus foot type bilateral.   Assessment and Plan:  Problem List Items Addressed This Visit    None    Visit Diagnoses    Right second toe ulcer, limited to breakdown of skin ([place])    -  Primary   Diabetic neuropathy with neurologic complication ([place])       PAD (peripheral artery disease) ([place])       Status post amputation of right great toe ([place])         -Patient seen and evaluated -Awaiting results of previous fungal nail culture done 2 weeks ago  -Mechaically debrided right 2nd toe to healthy bleeding borders and applied betadine and dry dressing  to 2nd toe on right foot; advised patient to do the same at home until healed  -Advised patient to continue with post-op shoe on right foot with slow transition to diabetic shoes and insoles which were dispensed by Liliane Channel at today's visit. Advised patient to break in slowly  and to check for any signs of rubbing if noted return to post op shoe on right  -Advised patient to limit activity to necessity  -Advised patient to elevate for edema control   -Will plan for 2nd toe check on right and discussing fungal culture results if available at next office visit. In the meantime, patient to call office if any issues or problems arise.   Landis Martins, DPM

## 2016-11-26 LAB — RPR: RPR: NONREACTIVE

## 2016-11-26 LAB — T-HELPER CELL (CD4) - (RCID CLINIC ONLY)
CD4 T CELL HELPER: 35 % (ref 33–55)
CD4 T Cell Abs: 910 /uL (ref 400–2700)

## 2016-11-26 NOTE — Telephone Encounter (Signed)
Has appt on the 18th - can discuss med at that time. Refilled.

## 2016-11-27 ENCOUNTER — Ambulatory Visit (INDEPENDENT_AMBULATORY_CARE_PROVIDER_SITE_OTHER): Payer: BLUE CROSS/BLUE SHIELD | Admitting: Licensed Clinical Social Worker

## 2016-11-27 ENCOUNTER — Encounter: Payer: Self-pay | Admitting: Internal Medicine

## 2016-11-27 ENCOUNTER — Ambulatory Visit (INDEPENDENT_AMBULATORY_CARE_PROVIDER_SITE_OTHER): Payer: BLUE CROSS/BLUE SHIELD | Admitting: Internal Medicine

## 2016-11-27 VITALS — BP 125/77 | HR 101 | Temp 98.3°F | Ht 70.0 in | Wt 247.0 lb

## 2016-11-27 DIAGNOSIS — F329 Major depressive disorder, single episode, unspecified: Secondary | ICD-10-CM

## 2016-11-27 DIAGNOSIS — F32A Depression, unspecified: Secondary | ICD-10-CM

## 2016-11-27 DIAGNOSIS — F332 Major depressive disorder, recurrent severe without psychotic features: Secondary | ICD-10-CM

## 2016-11-27 DIAGNOSIS — Z23 Encounter for immunization: Secondary | ICD-10-CM

## 2016-11-27 DIAGNOSIS — B20 Human immunodeficiency virus [HIV] disease: Secondary | ICD-10-CM

## 2016-11-27 LAB — URINE CYTOLOGY ANCILLARY ONLY
CHLAMYDIA, DNA PROBE: NEGATIVE
NEISSERIA GONORRHEA: NEGATIVE

## 2016-11-27 LAB — HIV-1 RNA QUANT-NO REFLEX-BLD
HIV 1 RNA Quant: <20 copies/mL
HIV-1 RNA QUANT, LOG: NOT DETECTED {Log_copies}/mL

## 2016-11-27 NOTE — Progress Notes (Signed)
   Subjective:    Patient ID: Daniel [name], male    DOB: 1955-12-21, 61 y.o.   MRN: [id number]  HPI Here for follow up of HIV. He has continued to do well with Trimeq and no missed doses. Has missed several appointments with me but no issues.  CD4 of 910, viral load <20.  I personally reviewed his chart and since his last visit he has had his right hallux amputated at [facility] and currently being followed by [name] of podiatry.   He does endorse continued depression that has been essentially unchanged for years, no SI.     Review of Systems     Objective:   Physical Exam        Assessment & Plan:

## 2016-11-27 NOTE — Assessment & Plan Note (Signed)
He had a chance to talk to our counselor today and I encouraged him to follow up with her.

## 2016-11-27 NOTE — Assessment & Plan Note (Signed)
Doing well with his medication.   I spent 30 minutes with the patient including 15 minutes face to face discussion/counseling of depression, recent osteomyelitis.

## 2016-11-28 NOTE — BH Specialist Note (Signed)
Integrated Behavioral Health Initial Visit  MRN: [id number] Name: Daniel [name]  Number of [place] Clinician visits:: 1/6 Session Start time: 11:32 am  Session End time: 12:34 pm Total time: 1 hour  Type of Service: [place] Interpretor:No. Interpretor Name and Language: N/A  SUBJECTIVE: Daniel [name] is a 61 y.o. male accompanied by self Patient was referred by Dr. Linus [name] for current psychotropic medications.  Patient reports the following symptoms/concerns: Current mood is "depressed", insomnia (going to bed at 9 am and waking up at 8 pm), feeling hopeless, decreased motivation and poor social interactions, fatigue, decreased concentration, and general thoughts of death.  Patient denied having current specific suicidal ideations, plan, or intent. Patient denied Alcohol consumption and denied use of illicit substances, however reported receiving a DWI in August 2018.  Patient reported history of Gambling Disorder and reported that he went to the [facility] three times in the past month.  Patient denied that he was relapsing and returning to addictive gambling behaviors.  Patient reported having a history of depression and anxiety and medication management, and is currently being prescribed Xanax 1 mg, Ambien 10 mg, and Trintellix 20 mg by Dr. Toy [name].  Patient denied currently engaging in behavioral health services but presented with interest.  [facility] looked up behavioral health providers in the [place] area on [employer] and provided the patient with one referral.  Additionally the [facility] called the patient's Member Services number for [facility] and asked for additional providers in the area.  Insurance Rep reported that she would email list to the patient.  Severity of problem: severe  OBJECTIVE: Mood: Anxious and Depressed and Affect: within range Risk of harm to self or others: No plan to harm self or others  LIFE  CONTEXT: Family and Social: Patient lives with his brother, who has Stage 4 cancer, and his 24 year-old aunt in [place]. School/Work: Patient does not work. Self-[name]: Although patient recently had his toe amputated in 2018, he is able to tend to his ADL's. Life Changes: Recent medical complications of Diabetes leading to infection in his toes and amputation.  ASSESSMENT: Patient is currently presenting with depressive symptoms and anxiety and may benefit from continued medication management and engagement with on-going behavioral health services.  GOALS ADDRESSED: Patient will: 1. Reduce symptoms of: anxiety, depression and insomnia 2. Increase knowledge and/or ability of: coping skills and healthy habits  3. Demonstrate ability to: Increase healthy adjustment to current life circumstances, Increase adequate support systems for patient/family and Increase and incorporate use of good sleep hygiene  INTERVENTIONS: Interventions utilized: Sleep Hygiene and Link to [employer]   PLAN: 1. Referral(s): [place] (LME/Outside Clinic) 2. Patient will contact a chosen provider and schedule an appointment to begin behavioral health services.  Daniel [name], [name]

## 2016-12-01 ENCOUNTER — Ambulatory Visit: Payer: BLUE CROSS/BLUE SHIELD | Admitting: Neurology

## 2016-12-10 ENCOUNTER — Encounter (HOSPITAL_COMMUNITY): Payer: Self-pay | Admitting: Physical Therapy

## 2016-12-10 ENCOUNTER — Ambulatory Visit (HOSPITAL_COMMUNITY): Payer: BLUE CROSS/BLUE SHIELD | Attending: Otolaryngology | Admitting: Physical Therapy

## 2016-12-10 DIAGNOSIS — M6281 Muscle weakness (generalized): Secondary | ICD-10-CM

## 2016-12-10 DIAGNOSIS — R29898 Other symptoms and signs involving the musculoskeletal system: Secondary | ICD-10-CM | POA: Diagnosis present

## 2016-12-10 DIAGNOSIS — R29818 Other symptoms and signs involving the nervous system: Secondary | ICD-10-CM

## 2016-12-10 DIAGNOSIS — R2681 Unsteadiness on feet: Secondary | ICD-10-CM | POA: Diagnosis present

## 2016-12-10 NOTE — Therapy (Signed)
[place], [place], [address] Phone: [phone]   Fax:  [phone]  Physical Therapy Evaluation  Patient Details  Name: Daniel [name] MRN: [id number] Date of Birth: 1955/06/21 Referring Provider: Melida Quitter   Encounter Date: 12/10/2016      PT End of Session - 12/10/16 1605    Visit Number 1   Number of Visits 9   Date for PT Re-Evaluation 01/07/17   Authorization Type [employer] Other (covered 100%); 30 visit limit 4 used at eval    Authorization Time Period 12/10/16 to 01/10/17   Authorization - Visit Number 1   Authorization - Number of Visits 26   PT Start Time 1324   PT Stop Time 1556   PT Time Calculation (min) 38 min   Activity Tolerance Patient tolerated treatment well   Behavior During Therapy [facility] for tasks assessed/performed      Past Medical History:  Diagnosis Date  . ADHD (attention deficit hyperactivity disorder)   . Anxiety   . Chronic kidney disease   . Clotting disorder ([place])   . Depression   . Diabetes mellitus without complication ([place])   . Diabetes mellitus, type II ([place])   . Dizziness 03/17/2015  . GERD (gastroesophageal reflux disease)   . HIV disease ([place])   . HIV infection ([place])   . Liver disease   . OSA (obstructive sleep apnea) 07/25/2015   Uses CPAP regularly  . Peripheral vascular disease ([place])   . Ulcer     Past Surgical History:  Procedure Laterality Date  . SMALL INTESTINE SURGERY    . STOMACH SURGERY    . TOE AMPUTATION Right 08/2016   right great toe    There were no vitals filed for this visit.       Subjective Assessment - 12/10/16 1520    Subjective Paitent arrives stating that he has been having some trouble with his balance, when he bends over he loses his balance and he can also lose it backwards. He has not had a lot of falls recently, some close calls however. Turning corners sharply is also a problem for him, he will clip corners and get unsteady.  He did have a fall 2 days ago in his bedroom, he mentions later, he was in his bedroom with the lights off and hit the floor; he is not sure what he hit when he fell or what caused the fall.    Pertinent History thrombophlebitis, DM, depression, ADHD, anxiety, hx Hep B, history LBP, hx HIV infection, toe amputation 7/18   Patient Stated Goals "get knees replaced, get back worked on, work on my balance"    Currently in Pain? Yes   Pain Score 3    Pain Location Other (Comment)  neruopathic pain, knees, back    Pain Orientation Right;Left   Pain Descriptors / Indicators Other (Comment)  neuropathic pain    Pain Type Chronic pain   Pain Radiating Towards up back, up legs    Pain Onset More than a month ago   Pain Frequency Constant   Aggravating Factors  working    Pain Relieving Factors rest    Effect of Pain on Daily Activities pushes through as able             [facility] PT Assessment - 12/10/16 0001      Assessment   Medical Diagnosis balance    Referring Provider Melida Quitter    Onset Date/Surgical Date --  chronic    Next MD Visit none with Dr. Redmond Baseman    Prior Therapy PT here for his back/toe wound      Precautions   Precautions Fall     Restrictions   Weight Bearing Restrictions No     Balance Screen   Has the patient fallen in the past 6 months Yes   How many times? unsure   Has the patient had a decrease in activity level because of a fear of falling?  No   Is the patient reluctant to leave their home because of a fear of falling?  No     Prior Function   Level of Independence Independent;Independent with basic ADLs;Independent with gait;Independent with transfers   Vocation Retired;On disability     Strength   Overall Strength Comments difficulty with floor to stand    Right Hip Flexion 4+/5   Right Hip ABduction 4+/5   Left Hip Flexion 4+/5   Left Hip ABduction 4+/5   Right Knee Flexion 4-/5   Right Knee Extension 5/5   Left Knee Flexion 4-/5   Left Knee  Extension 5/5   Right Ankle Dorsiflexion 5/5   Left Ankle Dorsiflexion 5/5     Standardized Balance Assessment   Standardized Balance Assessment Dynamic Gait Index     Dynamic Gait Index   Level Surface Normal   Change in Gait Speed Normal   Gait with Horizontal Head Turns Mild Impairment   Gait with Vertical Head Turns Mild Impairment   Gait and Pivot Turn Mild Impairment   Step Over Obstacle Mild Impairment   Step Around Obstacles Normal   Steps Normal   Total Score 20            Objective measurements completed on examination: See above findings.                  PT Education - 12/10/16 1605    Education provided Yes   Education Details prognosis, exam findings, POC, HEP to be given next session, possible vestibular involvement    Person(s) Educated Patient   Methods Explanation   Comprehension Verbalized understanding          PT Short Term Goals - 12/10/16 1609      PT SHORT TERM GOAL #1   Title Patient to be compliant with correct performance of HEP, to be updated PRN    Time 1   Period Weeks   Status New   Target Date 12/17/16     PT SHORT TERM GOAL #2   Title Patient to be educated and participatory in formation of exercise habits, and will report that he has been working on an independent exercise program at least 2x/week in order to improve physical fitness and assist in reducing pain    Time 2   Period Weeks   Status New   Target Date 12/24/16           PT Long Term Goals - 12/10/16 1610      PT LONG TERM GOAL #1   Title Patient to score 5/5 in all tested muscle groups via MMT in order to improve floor to stand and functional balance    Time 4   Period Weeks   Status New   Target Date 01/07/17     PT LONG TERM GOAL #2   Title Patient score 24/24 on DGI in order to show improved functional balance/reduced fall risk    Time 4   Period Weeks  Status New     PT LONG TERM GOAL #3   Title Patient to be able to complete  floor to stand transfer with RPE no more than 3/10 and no shortness of breath on a modified independent basis in order to show improved functional status    Time 4   Period Weeks   Status New     PT LONG TERM GOAL #4   Title Patient to report no dizziness with head turns in order to show improvement of symptoms and reduced fall risk    Time 4   Period Weeks   Status New     PT LONG TERM GOAL #5   Title Patient to be participatory in regular exercise program, at least 10-15 minutes in duration and 3 days per week, in order to maintain functional gains and promote improved health status    Time 4   Period Weeks   Status New                Plan - 12/10/16 1606    Clinical Impression Statement Patient arrives today reporting concerns about his strength and balance; he has had some falls recently but not as much as earlier this year, one recently was due to walking in his room with the lights out. He is concerned about getting up off of the floor as this is difficult for him. Examination reveals mild functional muscle weakness, difficulty with floor to stand transfers, mild balance deficit, and possible vestibular involvement as indicated by dizziness with head turns. Recommend skilled PT services to address functional deficits moving forward.    History and Personal Factors relevant to plan of care: toe amputation, neuropathy, chroniic pain, obesity   Clinical Presentation Stable   Clinical Presentation due to: neuropathy, deconditioning, compensations due to pain    Clinical Decision Making Low   Rehab Potential Fair   Clinical Impairments Affecting Rehab Potential (+) reports he is motivated to try PT; (-) confession he will probably not do his HEP at home, obesity, chronicity of pain, neuropathy    PT Frequency 2x / week   PT Duration 4 weeks   PT Treatment/Interventions ADLs/Self Care Home Management;Canalith Repostioning;Biofeedback;Moist Heat;Ultrasound;Gait training;Stair  training;Functional mobility training;Therapeutic activities;Therapeutic exercise;Balance training;Neuromuscular re-education;Patient/family education;Manual techniques;Energy conservation;Taping;Vestibular   PT Next Visit Plan assign HEP; review initial eval/goals; vestibular screen; balance and advacned functional strength    PT Home Exercise Plan give 2nd session (time limitations at eval)   Consulted and Agree with Plan of Care Patient      Patient will benefit from skilled therapeutic intervention in order to improve the following deficits and impairments:  Abnormal gait, Improper body mechanics, Pain, Decreased coordination, Postural dysfunction, Decreased activity tolerance, Decreased strength, Decreased balance  Visit Diagnosis: Unsteadiness on feet - Plan: PT plan of care cert/re-cert  Muscle weakness (generalized) - Plan: PT plan of care cert/re-cert  Other symptoms and signs involving the musculoskeletal system - Plan: PT plan of care cert/re-cert  Other symptoms and signs involving the nervous system - Plan: PT plan of care cert/re-cert     Problem List Patient Active Problem List   Diagnosis Date Noted  . Confusion 09/17/2016  . Chronic migraine 09/17/2016  . Gait abnormality 09/17/2016  . Diabetic foot infection ([place]) 06/28/2016  . Screening examination for venereal disease 02/11/2016  . Critical lower limb ischemia 12/07/2015  . Fall 12/05/2015  . Toe ulcer, right ([place]) 09/19/2015  . Decreased pedal pulses 09/19/2015  . OSA on CPAP 09/05/2015  .  Major depressive disorder, recurrent episode, moderate ([place]) 09/05/2015  . OSA (obstructive sleep apnea) 07/25/2015  . Passed out [facility]) 06/28/2015  . Low back pain 06/11/2015  . Abnormality of gait 06/11/2015  . Dizziness 03/17/2015  . Weakness 02/21/2015  . Chronic renal insufficiency, stage III (moderate) ([place]) 08/09/2014  . Diabetes ([place]) 11/07/2013  . Hematuria 06/21/2013  . Hepatic steatosis 09/09/2010  . UTI  09/15/2006  . PYURIA 09/03/2006  . Human immunodeficiency virus (HIV) disease ([place]) 06/04/2006  . HERPES ZOSTER, UNCOMPLICATED 86/38/1771  . HSV 06/04/2006  . Depression 06/04/2006  . DISORDER, ATTENTION DEFICIT W/HYPERACTIVITY 06/04/2006  . THROMBOPHLEBITIS NOS 06/04/2006  . GERD 06/04/2006  . ARTHRITIS, HAND 06/04/2006  . HYPERGLYCEMIA, HX OF 06/04/2006  . HEPATITIS B, HX OF 06/04/2006    Deniece Ree PT, DPT [place] [address] [place], [place], [address] Phone: [phone]   Fax:  [phone]  Name: Daniel [name] MRN: [id number] Date of Birth: 1955-03-22

## 2016-12-11 ENCOUNTER — Encounter: Payer: Self-pay | Admitting: Sports Medicine

## 2016-12-11 ENCOUNTER — Ambulatory Visit (INDEPENDENT_AMBULATORY_CARE_PROVIDER_SITE_OTHER): Payer: BLUE CROSS/BLUE SHIELD | Admitting: Family Medicine

## 2016-12-11 ENCOUNTER — Ambulatory Visit (INDEPENDENT_AMBULATORY_CARE_PROVIDER_SITE_OTHER): Payer: BLUE CROSS/BLUE SHIELD | Admitting: Sports Medicine

## 2016-12-11 ENCOUNTER — Encounter: Payer: Self-pay | Admitting: Family Medicine

## 2016-12-11 VITALS — BP 122/68 | HR 96 | Temp 98.2°F | Resp 18 | Ht 70.87 in | Wt 251.2 lb

## 2016-12-11 DIAGNOSIS — M792 Neuralgia and neuritis, unspecified: Secondary | ICD-10-CM | POA: Diagnosis not present

## 2016-12-11 DIAGNOSIS — Z86718 Personal history of other venous thrombosis and embolism: Secondary | ICD-10-CM | POA: Diagnosis not present

## 2016-12-11 DIAGNOSIS — R2689 Other abnormalities of gait and mobility: Secondary | ICD-10-CM

## 2016-12-11 DIAGNOSIS — L97511 Non-pressure chronic ulcer of other part of right foot limited to breakdown of skin: Secondary | ICD-10-CM

## 2016-12-11 DIAGNOSIS — B351 Tinea unguium: Secondary | ICD-10-CM | POA: Diagnosis not present

## 2016-12-11 DIAGNOSIS — E1149 Type 2 diabetes mellitus with other diabetic neurological complication: Secondary | ICD-10-CM

## 2016-12-11 DIAGNOSIS — M7989 Other specified soft tissue disorders: Secondary | ICD-10-CM | POA: Diagnosis not present

## 2016-12-11 DIAGNOSIS — E1140 Type 2 diabetes mellitus with diabetic neuropathy, unspecified: Secondary | ICD-10-CM

## 2016-12-11 DIAGNOSIS — R609 Edema, unspecified: Secondary | ICD-10-CM

## 2016-12-11 DIAGNOSIS — Z79899 Other long term (current) drug therapy: Secondary | ICD-10-CM

## 2016-12-11 DIAGNOSIS — I739 Peripheral vascular disease, unspecified: Secondary | ICD-10-CM

## 2016-12-11 DIAGNOSIS — N184 Chronic kidney disease, stage 4 (severe): Secondary | ICD-10-CM

## 2016-12-11 DIAGNOSIS — R0609 Other forms of dyspnea: Secondary | ICD-10-CM

## 2016-12-11 DIAGNOSIS — R0600 Dyspnea, unspecified: Secondary | ICD-10-CM

## 2016-12-11 MED ORDER — PREGABALIN 100 MG PO CAPS: 100.0000 mg | ORAL_CAPSULE | Freq: Three times a day (TID) | ORAL | 3 refills | Status: DC

## 2016-12-11 NOTE — Progress Notes (Signed)
Subjective:  By signing my name below, I, Daniel [name], attest that this documentation has been prepared under the direction and in the presence of [name], MD. Electronically Signed: [name], [place]. 12/11/2016 , 6:55 PM .  Patient was seen in Room 10 .   Patient ID: Daniel [name], male    DOB: Aug 28, 1955, 61 y.o.   MRN: [id number] Chief Complaint  Patient presents with  . Altered Mental Status    follow up on confusion, falls   HPI Daniel [name] is a 61 y.o. male  Patient is here for follow up. See last visit, multiple concerns were addressed at that time. There were some concerns of confusion and alertness at home from his PIC-line nurse. He did admit to occasional confusion but did not check [name] sugar at those times. He did have recent CT including in June without apparent ischemia or white matter disease. Recommended follow up with neurology to discuss confusion and reported leg weakness. He does has history of peripheral neuropathy and does take Lyrica. He is also under care of podiatry due to osteomyelitis of right foot.   His most recent A1C on Aug 17th was 7.4. His UDS was positive only for benzodiazepines on Aug 25th. He had normal TSH, and normal B-12. He was most recently evaluated by [name] on Aug 30th, thought that his presyncope falling episodes were multifactorial with A1C of 8.7 in May. Discussed counteractive maneuvers for counter stasis, hydration and monitoring glucose; continue Depakote for chronic migraine headaches. He was seen on Sept 25th by ENT, did not appear to be inner ear disease process. Recommended vestibular rehab; most recently seen by outpatient rehab physical therapy yesterday.   Peripheral neuropathy Patient complains of burning and prickling pain in his feet bilaterally. He is taking Lyrica 65m TID. He hasn't talked to Dr. YKrista Blueabout it yet. He mentions a new wound in his right 2nd toe, started about 3-4 weeks ago. He saw Dr. SCannon Kettle  podiatrist, today and requested hepatic panel.   He states having right leg swelling since [name] clot in January 2017. He denies any recent changes in his swelling. He had ABI on Aug 4th 2017, right was 1.32 and left was 1.19. He had right lower extremity doppler, no evidence of deep or thrombocytosis for history of femoral DVT. He denies any new shortness of breath or chest pain. He's on Xarelto 172mQD, without any missed doses.   He had normal stress test, low risk study, and Lexiscan with mild perfusion, echo in 2017 with EF 60-65%.   DUI He was charged with DUI on while prescribed medications. His court date is on Dec 12th.   Diabetes His home sugars have been running in 190s-300s, depending on what he eats. He denies any changes in his doses recently. He denies any symptomatic lows. He's still followed by Dr. ElLoanne Drillingwill see this month or next month (seen every 4 months).   Patient Active Problem List   Diagnosis Date Noted  . Confusion 09/17/2016  . Chronic migraine 09/17/2016  . Gait abnormality 09/17/2016  . Diabetic foot infection ([name]05/06/2016  . Screening examination for venereal disease 02/11/2016  . Critical lower limb ischemia 12/07/2015  . Fall 12/05/2015  . Toe ulcer, right ([name]07/26/2017  . Decreased pedal pulses 09/19/2015  . OSA on CPAP 09/05/2015  . Major depressive disorder, recurrent episode, moderate ([name]07/01/2016  . OSA (obstructive sleep apnea) 07/25/2015  . [facility] ([name]/2015  .  Low back pain 06/11/2015  . Abnormality of gait 06/11/2015  . Dizziness 03/17/2015  . Weakness 02/21/2015  . Chronic renal insufficiency, stage III (moderate) ([place]) 08/09/2014  . Diabetes ([place]) 11/07/2013  . Hematuria 06/21/2013  . Hepatic steatosis 09/09/2010  . UTI 09/15/2006  . PYURIA 09/03/2006  . Human immunodeficiency virus (HIV) disease ([place]) 06/04/2006  . HERPES ZOSTER, UNCOMPLICATED 95/10/3265  . HSV 06/04/2006  . Depression 06/04/2006  .  DISORDER, ATTENTION DEFICIT W/HYPERACTIVITY 06/04/2006  . THROMBOPHLEBITIS NOS 06/04/2006  . GERD 06/04/2006  . ARTHRITIS, HAND 06/04/2006  . HYPERGLYCEMIA, HX OF 06/04/2006  . HEPATITIS B, HX OF 06/04/2006   Past Medical History:  Diagnosis Date  . ADHD (attention deficit hyperactivity disorder)   . Anxiety   . Chronic kidney disease   . Clotting disorder ([place])   . Depression   . Diabetes mellitus without complication ([place])   . Diabetes mellitus, type II ([place])   . Dizziness 03/17/2015  . GERD (gastroesophageal reflux disease)   . HIV disease ([place])   . HIV infection ([place])   . Liver disease   . OSA (obstructive sleep apnea) 07/25/2015   Uses CPAP regularly  . Peripheral vascular disease ([place])   . Ulcer    Past Surgical History:  Procedure Laterality Date  . SMALL INTESTINE SURGERY    . STOMACH SURGERY    . TOE AMPUTATION Right 08/2016   right great toe   Allergies  Allergen Reactions  . Aspirin Swelling  . Ibuprofen Swelling  . Sustiva [Efavirenz] Swelling and Rash    And rash.  . Nsaids Other (See Comments)    unknwn   Prior to Admission medications   Medication Sig Start Date End Date Taking? Authorizing Provider  acetaminophen (TYLENOL) 500 MG tablet Take 1,000 mg by mouth every 8 (eight) hours as needed for moderate pain.    Yes [provider]  ALPRAZolam Duanne Moron) 1 MG tablet Take 1 mg by mouth 3 (three) times daily as needed for anxiety.    Yes [provider]  amphetamine-dextroamphetamine (ADDERALL) 30 MG tablet Take 30 mg by mouth 3 (three) times daily.    Yes [provider]  atorvastatin (LIPITOR) 10 MG tablet Take 1 tablet (10 mg total) by mouth daily. 10/20/16  Yes [name], MD  diclofenac sodium (VOLTAREN) 1 % GEL APPLY 2 GRAMS TO EACH KNEE IN THE MORNING AND AT BEDTIME AND APPLY 1 GRAM TO EACH KNEE IN THE AFTERNOON. 11/26/16  Yes [name], MD  divalproex (DEPAKOTE ER) 500 MG 24 hr tablet Take 1 tablet (500 mg  total) by mouth at bedtime. 12/05/15  Yes Marcial Pacas, MD  insulin aspart (NOVOLOG FLEXPEN) 100 UNIT/ML FlexPen 3 times a day (just before each meal) 06-28-13 units 10/10/16  Yes Renato Shin, MD  Insulin Glargine (LANTUS SOLOSTAR) 100 UNIT/ML Solostar Pen Inject 75 Units into the skin every morning. 10/10/16  Yes Renato Shin, MD  Insulin Pen Needle (PEN NEEDLES 3/16") 31G X 5 MM MISC 1 Device by Other route 4 (four) times daily. Use to inject Lantus daily. 12/25/15  Yes Renato Shin, MD  levothyroxine (SYNTHROID, LEVOTHROID) 50 MCG tablet Take 1 tablet (50 mcg total) by mouth at bedtime. 07/24/15  Yes Daub, Loura Back, MD  LYRICA 75 MG capsule TAKE ONE CAPSULE BY MOUTH THREE TIMES DAILY. 06/16/16  Yes [name], MD  ondansetron (ZOFRAN-ODT) 4 MG disintegrating tablet Take 2 tablets (8 mg total) by mouth every 8 (eight) hours as needed  for nausea or vomiting. 02/11/16  Yes Comer, Okey Regal, MD  protriptyline (VIVACTIL) 10 MG tablet Take 10 mg by mouth 3 (three) times daily.  01/30/16  Yes [provider]  ranitidine (ZANTAC) 150 MG tablet Take 150 mg by mouth daily.   Yes [provider]  REXULTI 1 MG TABS  11/10/16  Yes [provider]  TRINTELLIX 20 MG TABS Take 20 mg by mouth at bedtime. 01/26/15  Yes [provider]  TRIUMEQ 600-50-300 MG tablet TAKE 1 TABLET BY MOUTH DAILY 04/02/16  Yes Comer, Okey Regal, MD  UNABLE TO FIND CPAP MACHINE with standard [employer]claim nasal mask with humidifier. Set at 14 cwp 04/12/13  Yes Daub, Loura Back, MD  XARELTO 15 MG TABS tablet TAKE ONE TABLET BY MOUTH DAILY. 11/15/16  Yes [name], MD  zolpidem (AMBIEN) 10 MG tablet Take 10-20 mg by mouth at bedtime as needed for sleep.    Yes [provider]   Social History   Social History  . Marital status: Single    Spouse name: N/A  . Number of children: N/A  . Years of education: N/A   Occupational History  . Not on file.   Social History Main Topics  . Smoking  status: Former Smoker    Packs/day: 0.10    Years: 10.00    Types: Cigars, Cigarettes    Quit date: 08/09/2014  . Smokeless tobacco: Never Used  . Alcohol use No  . Drug use: No  . Sexual activity: Not Currently    Partners: Male     Comment: pt. declined condoms   Other Topics Concern  . Not on file   Social History Narrative   Epworth Sleepiness Scale = 7 (as of 03/16/2015)   Review of Systems  Constitutional: Negative for fatigue and unexpected weight change.  Eyes: Negative for visual disturbance.  Respiratory: Negative for cough, chest tightness and shortness of breath.   Cardiovascular: Positive for leg swelling. Negative for chest pain and palpitations.  Gastrointestinal: Negative for abdominal pain and [name] in stool.  Musculoskeletal: Positive for myalgias.  Neurological: Positive for numbness. Negative for dizziness, light-headedness and headaches.       Objective:   Physical Exam  Constitutional: He is oriented to person, place, and time. He appears well-developed and well-nourished.  HENT:  Head: Normocephalic and atraumatic.  Eyes: Pupils are equal, round, and reactive to light. EOM are normal.  Neck: No JVD present. Carotid bruit is not present.  Cardiovascular: Normal rate, regular rhythm and normal heart sounds.   No murmur heard. Pulmonary/Chest: Effort normal and breath sounds normal. He has no rales.  Musculoskeletal: He exhibits no edema.  Bilateral lower extremity pitting edema to proximal 3rd tibia, more prominent on the right; calf circumference 15cm below patella:  Right: 45cm  Left: 42cm  Left foot: skin intact, does have some abrasions distal foot, callous plantar feet without open wounds Right foot: wound intact at previous right great toe amputation, 2nd toe is wrapped in bandages, 3rd, 4th and 5th toes skin intact  Neurological: He is alert and oriented to person, place, and time.  Skin: Skin is warm and dry.  Psychiatric: He has a normal mood  and affect.  Vitals reviewed.   Vitals:   12/11/16 1622  BP: 122/68  Pulse: 96  Resp: 18  Temp: 98.2 F (36.8 C)  TempSrc: Oral  SpO2: 98%  Weight: 251 lb 3.2 oz (113.9 kg)  Height: 5' 10.87" (1.8 m)  Assessment & Plan:   JASKARAN DAUZAT is a 61 y.o. male Peripheral neuropathic pain - Plan: pregabalin (LYRICA) 100 MG capsule  - decreased control  try higher dose of lyrica, and update me on symptoms. Watch for increased sedation or other side effects.   Chronic kidney disease (CKD), stage IV (severe) ([name]) - Plan: Comprehensive metabolic panel  - check CMP with hx of leg swelling and prior CKD  Right leg swelling - Plan: Pro b natriuretic peptide, VAS [name] LOWER EXTREMITY VENOUS (DVT) History of DVT (deep vein thrombosis)  - check ultrasound, but has been compliant with anticoagulant.   Balance disorder  - longstanding, reviewed neuro notes about likely multifactorial cause and recommendations. Continue hydration, follow up with podiatry, endocrinology, and will work on peripheral neuropathy as above.   DOE (dyspnea on exertion) - Plan: Pro b natriuretic peptide Peripheral edema - Plan: Comprehensive metabolic panel, Pro b natriuretic peptide  - longstanding symptoms, but will check BNP , lytes, andasked to follow up to discuss further. ER/RTC precautions if worse.   Will need to check into options for substance abuse assessment as requested prior to his court date.   Meds ordered this encounter  Medications  . pregabalin (LYRICA) 100 MG capsule    Sig: Take 1 capsule (100 mg total) by mouth 3 (three) times daily.    Dispense:  90 capsule    Refill:  3   Patient Instructions    For neuropathic pain in feet - try higher dose of lyrica - 122m three times per day.   I will order liver tests, kidney tests, test for congestive heart failure for the shortness of breath and leg swelling.   Please follow up in next 2 weeks to discuss shortness of breath further. Return to  the clinic or go to the nearest emergency room if any of your symptoms worsen or new symptoms occur.  I will also recehck ultrasound of right leg, but unlikely [name] clot  Recheck in next few weeks - I can look into options for substance abuse assessment.    IF you received an x-ray today, you will receive an invoice from [facility]. Please contact [facility] at [phone] questions or concerns regarding your invoice.   IF you received labwork today, you will receive an invoice from [facility] Please contact [facility] at [phone] questions or concerns regarding your invoice.   Our billing staff will not be able to assist you with questions regarding bills from these companies.  You will be contacted with the lab results as soon as they are available. The fastest way to get your results is to activate your My Chart account. Instructions are located on the last page of this paperwork. If you have not heard from [facility]arding the results in 2 weeks, please contact this office.      I personally performed the services described in this documentation, which was scribed in my presence. The recorded information has been reviewed and considered for accuracy and completeness, addended by me as needed, and agree with information above.  Signed,   JMerri Ray MD [facility]  12/13/16 10:55 PM

## 2016-12-11 NOTE — Progress Notes (Signed)
   Subjective:    Patient ID: Daniel [name], male    DOB: 1955-04-29, 61 y.o.   MRN: [id number]  HPI    Review of Systems  All other systems reviewed and are negative.      Objective:   Physical Exam        Assessment & Plan:

## 2016-12-11 NOTE — Progress Notes (Signed)
Subjective: Daniel [name] is a 61 y.o. male patient seen today in office for follow up evaluation of right foot, s/p Right hallux amputation done at [facility] and ulcer to 2nd toe, reports that he has been dressing it with betadine and that there area is drying up and getting better. Desires to discuss fungal culture results. States that diabetic shoes fit well. Patient denies constitional symptoms. Patient denies any other issues.   FBS not recorded   Patient Active Problem List   Diagnosis Date Noted  . Confusion 09/17/2016  . Chronic migraine 09/17/2016  . Gait abnormality 09/17/2016  . Diabetic foot infection ([place]) 06/28/2016  . Screening examination for venereal disease 02/11/2016  . Critical lower limb ischemia 12/07/2015  . Fall 12/05/2015  . Toe ulcer, right ([place]) 09/19/2015  . Decreased pedal pulses 09/19/2015  . OSA on CPAP 09/05/2015  . Major depressive disorder, recurrent episode, moderate ([place]) 09/05/2015  . OSA (obstructive sleep apnea) 07/25/2015  . Passed out [facility]) 06/28/2015  . Low back pain 06/11/2015  . Abnormality of gait 06/11/2015  . Dizziness 03/17/2015  . Weakness 02/21/2015  . Chronic renal insufficiency, stage III (moderate) ([place]) 08/09/2014  . Diabetes ([place]) 11/07/2013  . Hematuria 06/21/2013  . Hepatic steatosis 09/09/2010  . UTI 09/15/2006  . PYURIA 09/03/2006  . Human immunodeficiency virus (HIV) disease ([place]) 06/04/2006  . HERPES ZOSTER, UNCOMPLICATED 26/71/2458  . HSV 06/04/2006  . Depression 06/04/2006  . DISORDER, ATTENTION DEFICIT W/HYPERACTIVITY 06/04/2006  . THROMBOPHLEBITIS NOS 06/04/2006  . GERD 06/04/2006  . ARTHRITIS, HAND 06/04/2006  . HYPERGLYCEMIA, HX OF 06/04/2006  . HEPATITIS B, HX OF 06/04/2006    Current Outpatient Prescriptions on File Prior to Visit  Medication Sig Dispense Refill  . acetaminophen (TYLENOL) 500 MG tablet Take 1,000 mg by mouth every 8 (eight) hours as needed for moderate pain.     [name] ALPRAZolam (XANAX) 1  MG tablet Take 1 mg by mouth 3 (three) times daily as needed for anxiety.     [name] amphetamine-dextroamphetamine (ADDERALL) 30 MG tablet Take 30 mg by mouth 3 (three) times daily.     [name] atorvastatin (LIPITOR) 10 MG tablet Take 1 tablet (10 mg total) by mouth daily. 90 tablet 0  . diclofenac sodium (VOLTAREN) 1 % GEL APPLY 2 GRAMS TO EACH KNEE IN THE MORNING AND AT BEDTIME AND APPLY 1 GRAM TO EACH KNEE IN THE AFTERNOON. 300 g 0  . divalproex (DEPAKOTE ER) 500 MG 24 hr tablet Take 1 tablet (500 mg total) by mouth at bedtime. 90 tablet 4  . insulin aspart (NOVOLOG FLEXPEN) 100 UNIT/ML FlexPen 3 times a day (just before each meal) 06-28-13 units 15 mL 11  . Insulin Glargine (LANTUS SOLOSTAR) 100 UNIT/ML Solostar Pen Inject 75 Units into the skin every morning. 10 pen PRN  . Insulin Pen Needle (PEN NEEDLES 3/16") 31G X 5 MM MISC 1 Device by Other route 4 (four) times daily. Use to inject Lantus daily. 120 each 11  . levothyroxine (SYNTHROID, LEVOTHROID) 50 MCG tablet Take 1 tablet (50 mcg total) by mouth at bedtime. 30 tablet 11  . LYRICA 75 MG capsule TAKE ONE CAPSULE BY MOUTH THREE TIMES DAILY. 90 capsule 5  . ondansetron (ZOFRAN-ODT) 4 MG disintegrating tablet Take 2 tablets (8 mg total) by mouth every 8 (eight) hours as needed for nausea or vomiting. 60 tablet 5  . protriptyline (VIVACTIL) 10 MG tablet Take 10 mg by mouth 3 (three) times daily.   11  .  ranitidine (ZANTAC) 150 MG tablet Take 150 mg by mouth daily.    [name] REXULTI 1 MG TABS   98  . TRINTELLIX 20 MG TABS Take 20 mg by mouth at bedtime.    . TRIUMEQ 600-50-300 MG tablet TAKE 1 TABLET BY MOUTH DAILY 30 tablet 6  . UNABLE TO FIND CPAP MACHINE with standard [employer] nasal mask with humidifier. Set at 14 cwp 1 each 0  . XARELTO 15 MG TABS tablet TAKE ONE TABLET BY MOUTH DAILY. 30 tablet 0  . zolpidem (AMBIEN) 10 MG tablet Take 10-20 mg by mouth at bedtime as needed for sleep.      No current facility-administered medications on file prior to visit.      Allergies  Allergen Reactions  . Aspirin Swelling  . Ibuprofen Swelling  . Sustiva [Efavirenz] Swelling and Rash    And rash.  . Nsaids Other (See Comments)    unknwn    Objective: There were no vitals filed for this visit.  General: No acute distress, AAOx3   Right foot: Incision well healed at surgical hallux amputation site, moderate swelling to right forefoot, blanchable erythema, decreased warmth, no drainage, There is a partial thickness ulceration at distal 2nd toe on right that measures 0.3x0.2cm with minimal dry keratotic skin from previous blister with no other signs of infection noted, nails 3-5 on right are thick yellow and mycotic with no signs of infection, Capillary fill time <5 seconds in all remaining toes on right, protective sensation diminished on right, No pain or crepitation with range of motion right foot. No pain with calf compression.   Left foot: Pre-ulcerative callus left hallux with minimal keratosis and no signs of infection. Dry skin. Nails 2-5 on left are thick yellow and mycotic with no signs of infection, No other acute findings.  Pes planus foot type bilateral.   Assessment and Plan:  Problem List Items Addressed This Visit    None    Visit Diagnoses    Dermatophytosis of nail    -  Primary   Encounter for long-term (current) use of medications       Relevant Orders   Hepatic Function Panel   Right second toe ulcer, limited to breakdown of skin ([place])       Diabetic neuropathy with neurologic complication ([place])       PAD (peripheral artery disease) ([place])         -Patient seen and evaluated -Fungal culture results discussed patient desires to try PO Lamisil; Rx LFTs if normal will proceed with lamisl x 12 weeks  -Cleansed ulceration and nonselectively debrided with moist guasze right 2nd toe to healthy bleeding borders and applied betadine and dry dressing  to 2nd toe on right foot; advised patient to do the same at home until healed   -Advised patient to continue with diabetic shoes and insoles  -Advised patient to limit activity to necessity  -Advised patient to elevate for edema control   -Will plan for med check and for 2nd toe check on right at next office visit. In the meantime, patient to call office if any issues or problems arise.   Landis Martins, DPM

## 2016-12-11 NOTE — Patient Instructions (Addendum)
  For neuropathic pain in feet - try higher dose of lyrica - 115m three times per day.   I will order liver tests, kidney tests, test for congestive heart failure for the shortness of breath and leg swelling.   Please follow up in next 2 weeks to discuss shortness of breath further. Return to the clinic or go to the nearest emergency room if any of your symptoms worsen or new symptoms occur.  I will also recehck ultrasound of right leg, but unlikely blood clot  Recheck in next few weeks - I can look into options for substance abuse assessment.    IF you received an x-ray today, you will [facility]. Please contact [facility] at [phone] questions or concerns regarding your invoice.   IF you received labwork today, you will receive an invoice from [facility] Please contact [facility] at [phone] questions or concerns regarding your invoice.   Our billing staff will not be able to assist you with questions regarding bills from these companies.  You will be contacted with the lab results as soon as they are available. The fastest way to get your results is to activate your My Chart account. Instructions are located on the last page of this paperwork. If you have not heard from [facility] the results in 2 weeks, please contact this office.

## 2016-12-12 LAB — COMPREHENSIVE METABOLIC PANEL
ALBUMIN: 4.1 g/dL (ref 3.6–4.8)
ALT: 20 IU/L (ref 0–44)
AST: 18 IU/L (ref 0–40)
Albumin/Globulin Ratio: 1.4 (ref 1.2–2.2)
Alkaline Phosphatase: 139 IU/L — ABNORMAL HIGH (ref 39–117)
BILIRUBIN TOTAL: 0.7 mg/dL (ref 0.0–1.2)
BUN / CREAT RATIO: 13 (ref 10–24)
BUN: 18 mg/dL (ref 8–27)
CO2: 23 mmol/L (ref 20–29)
Calcium: 9.4 mg/dL (ref 8.6–10.2)
Chloride: 101 mmol/L (ref 96–106)
Creatinine, Ser: 1.37 mg/dL — ABNORMAL HIGH (ref 0.76–1.27)
GFR calc non Af Amer: 55 mL/min/{1.73_m2} — ABNORMAL LOW (ref >59)
GFR, EST AFRICAN AMERICAN: 63 mL/min/{1.73_m2} (ref >59)
GLOBULIN, TOTAL: 2.9 g/dL (ref 1.5–4.5)
GLUCOSE: 280 mg/dL — AB (ref 65–99)
Potassium: 4.1 mmol/L (ref 3.5–5.2)
SODIUM: 138 mmol/L (ref 134–144)
TOTAL PROTEIN: 7 g/dL (ref 6.0–8.5)

## 2016-12-12 LAB — PRO B NATRIURETIC PEPTIDE: NT-Pro BNP: 31 pg/mL (ref 0–210)

## 2016-12-16 ENCOUNTER — Ambulatory Visit (HOSPITAL_COMMUNITY): Payer: BLUE CROSS/BLUE SHIELD | Admitting: Physical Therapy

## 2016-12-16 DIAGNOSIS — R2681 Unsteadiness on feet: Secondary | ICD-10-CM

## 2016-12-16 DIAGNOSIS — R29898 Other symptoms and signs involving the musculoskeletal system: Secondary | ICD-10-CM

## 2016-12-16 DIAGNOSIS — M6281 Muscle weakness (generalized): Secondary | ICD-10-CM

## 2016-12-16 NOTE — Therapy (Signed)
[facility] [place], [place], [address] Phone: [phone]   Fax:  [phone]  Physical Therapy Treatment  Patient Details  Name: Daniel [name] MRN: [id number] Date of Birth: 06/02/1955 Referring Provider: Melida Quitter   Encounter Date: 12/16/2016      PT End of Session - 12/16/16 1607    Visit Number 2   Number of Visits 9   Date for PT Re-Evaluation 01/07/17   Authorization Type [employer] Other (covered 100%); 30 visit limit 4 used at eval    Authorization Time Period 12/10/16 to 01/10/17   Authorization - Visit Number 2   Authorization - Number of Visits 26   PT Start Time 2025   PT Stop Time 1609   PT Time Calculation (min) 54 min   Equipment Utilized During Treatment Gait belt   Activity Tolerance Patient tolerated treatment well   Behavior During Therapy [facility] for tasks assessed/performed      Past Medical History:  Diagnosis Date  . ADHD (attention deficit hyperactivity disorder)   . Anxiety   . Chronic kidney disease   . Clotting disorder ([place])   . Depression   . Diabetes mellitus without complication ([place])   . Diabetes mellitus, type II ([place])   . Dizziness 03/17/2015  . GERD (gastroesophageal reflux disease)   . HIV disease ([place])   . HIV infection ([place])   . Liver disease   . OSA (obstructive sleep apnea) 07/25/2015   Uses CPAP regularly  . Peripheral vascular disease ([place])   . Ulcer     Past Surgical History:  Procedure Laterality Date  . SMALL INTESTINE SURGERY    . STOMACH SURGERY    . TOE AMPUTATION Right 08/2016   right great toe    There were no vitals filed for this visit.      Subjective Assessment - 12/16/16 1524    Subjective PT states that he fell over yesterday.  He went to pick something out of his yard and just kept going.    Pertinent History thrombophlebitis, DM, depression, ADHD, anxiety, hx Hep B, history LBP, hx HIV infection, toe amputation 7/18   Patient Stated Goals "get  knees replaced, get back worked on, work on my balance"    Currently in Pain? Yes   Pain Score 2    Pain Location Foot   Pain Orientation Right;Left   Pain Descriptors / Indicators Burning   Pain Type Chronic pain   Pain Onset More than a month ago   Pain Frequency Constant   Multiple Pain Sites Yes   Pain Score 2   Pain Location Knee   Pain Orientation Right;Left   Pain Descriptors / Indicators Aching   Pain Type Chronic pain   Pain Onset More than a month ago   Pain Frequency Intermittent   Aggravating Factors  varies                 Vestibular Assessment - 12/16/16 0001      Symptom Behavior   Type of Dizziness Unsteady with head/body turns   Aggravating Factors Forward bending   Relieving Factors Rest     Occulomotor Exam   Occulomotor Alignment Abnormal   Spontaneous Absent   Head shaking Horizontal Absent   Head Shaking Vertical Absent   Smooth Pursuits Saccades   Saccades Poor trajectory   Comment right eye      Vestibulo-Occular Reflex   VOR 1 Head Only (x 1 viewing) normal  VOR 2 Head and Object (x 2 viewing) normal      Positional Testing   Dix-Hallpike Dix-Hallpike Left;Dix-Hallpike Right   Horizontal Canal Testing Horizontal Canal Right;Horizontal Canal Left     Dix-Hallpike Right   Dix-Hallpike Right Symptoms No nystagmus     Dix-Hallpike Left   Dix-Hallpike Left Symptoms No nystagmus     Horizontal Canal Right   Horizontal Canal Right Symptoms Normal     Horizontal Canal Left   Horizontal Canal Left Symptoms Normal     Cognition   Cognition Orientation Level Appropriate for developmental age     Positional Sensitivities   Sit to Supine No dizziness   Up from Right Hallpike No dizziness   Up from Left Hallpike No dizziness                      Balance Exercises - 12/16/16 1544      Balance Exercises: Standing   Tandem Stance Eyes open;5 reps  head turns    SLS Eyes open;3 reps   Tandem Gait 2 reps;Forward    Marching Limitations 5   Heel Raises Limitations 10   Toe Raise Limitations 10   Sit to Stand Time 5   Other Standing Exercises nustep level 2 x 5'             PT Short Term Goals - 12/16/16 1610      PT SHORT TERM GOAL #1   Title Patient to be compliant with correct performance of HEP, to be updated PRN    Time 1   Period Weeks   Status On-going     PT SHORT TERM GOAL #2   Title Patient to be educated and participatory in formation of exercise habits, and will report that he has been working on an independent exercise program at least 2x/week in order to improve physical fitness and assist in reducing pain    Time 2   Period Weeks   Status On-going           PT Long Term Goals - 12/16/16 1610      PT LONG TERM GOAL #1   Title Patient to score 5/5 in all tested muscle groups via MMT in order to improve floor to stand and functional balance    Time 4   Period Weeks   Status On-going     PT LONG TERM GOAL #2   Title Patient score 24/24 on DGI in order to show improved functional balance/reduced fall risk    Time 4   Period Weeks   Status On-going     PT LONG TERM GOAL #3   Title Patient to be able to complete floor to stand transfer with RPE no more than 3/10 and no shortness of breath on a modified independent basis in order to show improved functional status    Time 4   Period Weeks   Status On-going     PT LONG TERM GOAL #4   Title Patient to report no dizziness with head turns in order to show improvement of symptoms and reduced fall risk    Time 4   Period Weeks   Status On-going     PT LONG TERM GOAL #5   Title Patient to be participatory in regular exercise program, at least 10-15 minutes in duration and 3 days per week, in order to maintain functional gains and promote improved health status    Time 4   Period Weeks  Status On-going               Plan - 12/16/16 1608    Clinical Impression Statement Reviewed evaluation and goals with  patient.  Pt was tested for possible BPPV but all testing was negative.  Began basic balance exercises.    Rehab Potential Fair   Clinical Impairments Affecting Rehab Potential (+) reports he is motivated to try PT; (-) confession he will probably not do his HEP at home, obesity, chronicity of pain, neuropathy    PT Frequency 2x / week   PT Duration 4 weeks   PT Treatment/Interventions ADLs/Self Care Home Management;Canalith Repostioning;Biofeedback;Moist Heat;Ultrasound;Gait training;Stair training;Functional mobility training;Therapeutic activities;Therapeutic exercise;Balance training;Neuromuscular re-education;Patient/family education;Manual techniques;Energy conservation;Taping;Vestibular   PT Next Visit Plan ; balance and advacned functional strength    PT Home Exercise Plan give 2nd session (time limitations at eval); heel raises/toe raises; Single leg stance and eye exercises    Consulted and Agree with Plan of Care Patient      Patient will benefit from skilled therapeutic intervention in order to improve the following deficits and impairments:  Abnormal gait, Improper body mechanics, Pain, Decreased coordination, Postural dysfunction, Decreased activity tolerance, Decreased strength, Decreased balance  Visit Diagnosis: Unsteadiness on feet  Muscle weakness (generalized)  Other symptoms and signs involving the musculoskeletal system     Problem List Patient Active Problem List   Diagnosis Date Noted  . Confusion 09/17/2016  . Chronic migraine 09/17/2016  . Gait abnormality 09/17/2016  . Diabetic foot infection ([place]) 06/28/2016  . Screening examination for venereal disease 02/11/2016  . Critical lower limb ischemia 12/07/2015  . Fall 12/05/2015  . Toe ulcer, right ([place]) 09/19/2015  . Decreased pedal pulses 09/19/2015  . OSA on CPAP 09/05/2015  . Major depressive disorder, recurrent episode, moderate ([place]) 09/05/2015  . OSA (obstructive sleep apnea) 07/25/2015  . Passed  out [facility]) 06/28/2015  . Low back pain 06/11/2015  . Abnormality of gait 06/11/2015  . Dizziness 03/17/2015  . Weakness 02/21/2015  . Chronic renal insufficiency, stage III (moderate) ([place]) 08/09/2014  . Diabetes ([place]) 11/07/2013  . Hematuria 06/21/2013  . Hepatic steatosis 09/09/2010  . UTI 09/15/2006  . PYURIA 09/03/2006  . Human immunodeficiency virus (HIV) disease ([place]) 06/04/2006  . HERPES ZOSTER, UNCOMPLICATED 35/00/9381  . HSV 06/04/2006  . Depression 06/04/2006  . DISORDER, ATTENTION DEFICIT W/HYPERACTIVITY 06/04/2006  . THROMBOPHLEBITIS NOS 06/04/2006  . GERD 06/04/2006  . ARTHRITIS, HAND 06/04/2006  . HYPERGLYCEMIA, HX OF 06/04/2006  . HEPATITIS B, HX OF 06/04/2006  Rayetta Humphrey, PT CLT [phone] 12/16/2016, 4:13 PM  [facility] [address] [place], [place], [address] Phone: [phone]   Fax:  [phone]  Name: Daniel [name] MRN: [id number] Date of Birth: 06-27-1955

## 2016-12-16 NOTE — Patient Instructions (Addendum)
Movements: Eyes Only (Pictorial Reference)    Therapist: Use this card with Eye Exercises 14 through 17.   Copyright  VHI. All rights reserved.  Heel Raise: Bilateral (Standing)    Rise on balls of feet. Repeat _10___ times per set. Do ___1_ sets per session. Do ___2_ sessions per day.  http://orth.exer.us/38   Copyright  VHI. All rights reserved.  Toe Raise (Standing)    Rock back on heels. Repeat _10___ times per set. Do _1___ sets per session. Do 2 ____ sessions per day.  http://orth.exer.us/42   Copyright  VHI. All rights reserved.  Functional Quadriceps: Chair Squat    Keeping feet flat on floor, shoulder width apart, squat as low as is comfortable. Use support as necessary. Repeat __10__ times per set. Do _1___ sets per session. Do __2__ sessions per day.  http://orth.exer.us/736   Copyright  VHI. All rights reserved.

## 2016-12-18 ENCOUNTER — Ambulatory Visit: Payer: BLUE CROSS/BLUE SHIELD | Admitting: Family Medicine

## 2016-12-18 ENCOUNTER — Encounter (HOSPITAL_COMMUNITY): Payer: Self-pay

## 2016-12-18 ENCOUNTER — Ambulatory Visit (HOSPITAL_COMMUNITY): Payer: BLUE CROSS/BLUE SHIELD

## 2016-12-18 DIAGNOSIS — R29898 Other symptoms and signs involving the musculoskeletal system: Secondary | ICD-10-CM

## 2016-12-18 DIAGNOSIS — M6281 Muscle weakness (generalized): Secondary | ICD-10-CM

## 2016-12-18 DIAGNOSIS — R29818 Other symptoms and signs involving the nervous system: Secondary | ICD-10-CM

## 2016-12-18 DIAGNOSIS — R2681 Unsteadiness on feet: Secondary | ICD-10-CM | POA: Diagnosis not present

## 2016-12-18 NOTE — Therapy (Signed)
[place], [place], [address] Phone: [phone]   Fax:  [phone]  Physical Therapy Treatment  Patient Details  Name: Daniel [name] MRN: [id number] Date of Birth: 18-Apr-1955 Referring Provider: Melida Quitter   Encounter Date: 12/18/2016      PT End of Session - 12/18/16 1353    Visit Number 3   Number of Visits 9   Date for PT Re-Evaluation 01/07/17   Authorization Type BC[employer] Other (covered 100%); 30 visit limit 4 used at eval    Authorization Time Period 12/10/16 to 01/10/17   Authorization - Visit Number 3   Authorization - Number of Visits 24   PT Start Time 6073   PT Stop Time 1436  last 5 min on nustep   PT Time Calculation (min) 47 min   Equipment Utilized During Treatment Gait belt   Activity Tolerance Patient tolerated treatment well   Behavior During Therapy [facility] for tasks assessed/performed      Past Medical History:  Diagnosis Date  . ADHD (attention deficit hyperactivity disorder)   . Anxiety   . Chronic kidney disease   . Clotting disorder ([place])   . Depression   . Diabetes mellitus without complication ([place])   . Diabetes mellitus, type II ([place])   . Dizziness 03/17/2015  . GERD (gastroesophageal reflux disease)   . HIV disease ([place])   . HIV infection ([place])   . Liver disease   . OSA (obstructive sleep apnea) 07/25/2015   Uses CPAP regularly  . Peripheral vascular disease ([place])   . Ulcer     Past Surgical History:  Procedure Laterality Date  . SMALL INTESTINE SURGERY    . STOMACH SURGERY    . TOE AMPUTATION Right 08/2016   right great toe    There were no vitals filed for this visit.      Subjective Assessment - 12/18/16 1352    Subjective Pt reports increased pain with standing exercises last session, current pain scale 2/10 Bil knee Lt>Rt.   Pertinent History thrombophlebitis, DM, depression, ADHD, anxiety, hx Hep B, history LBP, hx HIV infection, toe amputation 7/18    Patient Stated Goals "get knees replaced, get back worked on, work on my balance"    Currently in Pain? Yes   Pain Score 2    Pain Location Knee   Pain Orientation Right;Left   Pain Descriptors / Indicators Throbbing   Pain Type Chronic pain   Pain Radiating Towards up back, up legs   Pain Frequency Constant   Aggravating Factors  working   Pain Relieving Factors rest   Effect of Pain on Daily Activities pushes through as able                              Balance Exercises - 12/18/16 1410      Balance Exercises: Standing   Tandem Stance Eyes open;Foam/compliant surface;3 reps;30 secs   SLS Eyes open;3 reps  Rt 15', Lt 24" max of 3   Tandem Gait 2 reps;Forward   Sidestepping 4 reps;Theraband  sidestep RTB 4RT in // bars, no HHA   Marching Limitations 10x 5" holds   Heel Raises Limitations 10   Toe Raise Limitations 10   Sit to Stand Time 10 with reports of increased knee pain folllowing rep 7   Other Standing Exercises nustep level 4 (increased resistance per request) x 5'     Balance  Exercises: Seated   Dynamic Sitting No upper extremity support;No lower extremity support;Reaching outside base of support  cone rotation on dynadisc             PT Short Term Goals - 12/16/16 1610      PT SHORT TERM GOAL #1   Title Patient to be compliant with correct performance of HEP, to be updated PRN    Time 1   Period Weeks   Status On-going     PT SHORT TERM GOAL #2   Title Patient to be educated and participatory in formation of exercise habits, and will report that he has been working on an independent exercise program at least 2x/week in order to improve physical fitness and assist in reducing pain    Time 2   Period Weeks   Status On-going           PT Long Term Goals - 12/16/16 1610      PT LONG TERM GOAL #1   Title Patient to score 5/5 in all tested muscle groups via MMT in order to improve floor to stand and functional balance    Time 4    Period Weeks   Status On-going     PT LONG TERM GOAL #2   Title Patient score 24/24 on DGI in order to show improved functional balance/reduced fall risk    Time 4   Period Weeks   Status On-going     PT LONG TERM GOAL #3   Title Patient to be able to complete floor to stand transfer with RPE no more than 3/10 and no shortness of breath on a modified independent basis in order to show improved functional status    Time 4   Period Weeks   Status On-going     PT LONG TERM GOAL #4   Title Patient to report no dizziness with head turns in order to show improvement of symptoms and reduced fall risk    Time 4   Period Weeks   Status On-going     PT LONG TERM GOAL #5   Title Patient to be participatory in regular exercise program, at least 10-15 minutes in duration and 3 days per week, in order to maintain functional gains and promote improved health status    Time 4   Period Weeks   Status On-going               Plan - 12/18/16 1416    Clinical Impression Statement Pt limited by knee pain Lt>Rt limiting ability to complete CKC.  Increased seated rest breaks this session for pain control.  Added dynamic seated balance activities and progressed standing balance activities as able.     Rehab Potential Fair   Clinical Impairments Affecting Rehab Potential (+) reports he is motivated to try PT; (-) confession he will probably not do his HEP at home, obesity, chronicity of pain, neuropathy    PT Frequency 2x / week   PT Duration 4 weeks   PT Treatment/Interventions ADLs/Self Care Home Management;Canalith Repostioning;Biofeedback;Moist Heat;Ultrasound;Gait training;Stair training;Functional mobility training;Therapeutic activities;Therapeutic exercise;Balance training;Neuromuscular re-education;Patient/family education;Manual techniques;Energy conservation;Taping;Vestibular   PT Next Visit Plan ; balance and advacned functional strength       Patient will benefit from skilled  therapeutic intervention in order to improve the following deficits and impairments:  Abnormal gait, Improper body mechanics, Pain, Decreased coordination, Postural dysfunction, Decreased activity tolerance, Decreased strength, Decreased balance  Visit Diagnosis: Unsteadiness on feet  Muscle weakness (generalized)  Other  symptoms and signs involving the musculoskeletal system  Other symptoms and signs involving the nervous system     Problem List Patient Active Problem List   Diagnosis Date Noted  . Confusion 09/17/2016  . Chronic migraine 09/17/2016  . Gait abnormality 09/17/2016  . Diabetic foot infection ([place]) 06/28/2016  . Screening examination for venereal disease 02/11/2016  . Critical lower limb ischemia 12/07/2015  . Fall 12/05/2015  . Toe ulcer, right ([place]) 09/19/2015  . Decreased pedal pulses 09/19/2015  . OSA on CPAP 09/05/2015  . Major depressive disorder, recurrent episode, moderate ([place]) 09/05/2015  . OSA (obstructive sleep apnea) 07/25/2015  . Passed out [facility]) 06/28/2015  . Low back pain 06/11/2015  . Abnormality of gait 06/11/2015  . Dizziness 03/17/2015  . Weakness 02/21/2015  . Chronic renal insufficiency, stage III (moderate) ([place]) 08/09/2014  . Diabetes ([place]) 11/07/2013  . Hematuria 06/21/2013  . Hepatic steatosis 09/09/2010  . UTI 09/15/2006  . PYURIA 09/03/2006  . Human immunodeficiency virus (HIV) disease ([place]) 06/04/2006  . HERPES ZOSTER, UNCOMPLICATED 32/35/5732  . HSV 06/04/2006  . Depression 06/04/2006  . DISORDER, ATTENTION DEFICIT W/HYPERACTIVITY 06/04/2006  . THROMBOPHLEBITIS NOS 06/04/2006  . GERD 06/04/2006  . ARTHRITIS, HAND 06/04/2006  . HYPERGLYCEMIA, HX OF 06/04/2006  . HEPATITIS B, HX OF 06/04/2006   Ihor Austin, LPTA; [place]  Aldona Lento 12/18/2016, 6:23 PM  [place] [address] [place], [place], [address] Phone: [phone]   Fax:   [phone]  Name: ROURKE MCQUITTY MRN: [id number] Date of Birth: 05-Nov-1955

## 2016-12-19 ENCOUNTER — Ambulatory Visit (HOSPITAL_COMMUNITY): Admission: RE | Admit: 2016-12-19 | Payer: BLUE CROSS/BLUE SHIELD | Source: Ambulatory Visit

## 2016-12-22 ENCOUNTER — Other Ambulatory Visit: Payer: Self-pay | Admitting: Family Medicine

## 2016-12-22 ENCOUNTER — Other Ambulatory Visit: Payer: Self-pay | Admitting: Endocrinology

## 2016-12-22 DIAGNOSIS — I82409 Acute embolism and thrombosis of unspecified deep veins of unspecified lower extremity: Secondary | ICD-10-CM

## 2016-12-23 ENCOUNTER — Ambulatory Visit (HOSPITAL_COMMUNITY): Payer: BLUE CROSS/BLUE SHIELD

## 2016-12-23 ENCOUNTER — Encounter (HOSPITAL_COMMUNITY): Payer: Self-pay

## 2016-12-23 DIAGNOSIS — R2681 Unsteadiness on feet: Secondary | ICD-10-CM

## 2016-12-23 DIAGNOSIS — R29898 Other symptoms and signs involving the musculoskeletal system: Secondary | ICD-10-CM

## 2016-12-23 DIAGNOSIS — R29818 Other symptoms and signs involving the nervous system: Secondary | ICD-10-CM

## 2016-12-23 DIAGNOSIS — M6281 Muscle weakness (generalized): Secondary | ICD-10-CM

## 2016-12-23 NOTE — Therapy (Signed)
[place], [place], [address] Phone: [phone]   Fax:  [phone]  Physical Therapy Treatment  Patient Details  Name: Daniel [name] MRN: [id number] Date of Birth: 1955/05/20 Referring Provider: Melida Quitter   Encounter Date: 12/23/2016      PT End of Session - 12/23/16 1521    Visit Number 4   Number of Visits 9   Date for PT Re-Evaluation 01/07/17   Authorization Type BCBS Other (covered 100%); 30 visit limit 4 used at eval    Authorization Time Period 12/10/16 to 01/10/17   Authorization - Visit Number 4   Authorization - Number of Visits 24   PT Start Time 9767   PT Stop Time 1606  last 5 min on nustep   PT Time Calculation (min) 48 min   Equipment Utilized During Treatment Gait belt   Activity Tolerance Patient tolerated treatment well   Behavior During Therapy [facility] for tasks assessed/performed      Past Medical History:  Diagnosis Date  . ADHD (attention deficit hyperactivity disorder)   . Anxiety   . Chronic kidney disease   . Clotting disorder ([place])   . Depression   . Diabetes mellitus without complication ([place])   . Diabetes mellitus, type II ([place])   . Dizziness 03/17/2015  . GERD (gastroesophageal reflux disease)   . HIV disease ([place])   . HIV infection ([place])   . Liver disease   . OSA (obstructive sleep apnea) 07/25/2015   Uses CPAP regularly  . Peripheral vascular disease ([place])   . Ulcer     Past Surgical History:  Procedure Laterality Date  . SMALL INTESTINE SURGERY    . STOMACH SURGERY    . TOE AMPUTATION Right 08/2016   right great toe    There were no vitals filed for this visit.      Subjective Assessment - 12/23/16 1514    Subjective Pt stated increased LE pain following last session and almost fell down steps slipping today, Lt knee and Bil feet are burning today, pain scale 4/10.   Pertinent History thrombophlebitis, DM, depression, ADHD, anxiety, hx Hep B, history  LBP, hx HIV infection, toe amputation 7/18   Patient Stated Goals "get knees replaced, get back worked on, work on my balance"    Currently in Pain? Yes   Pain Score 4    Pain Location Leg   Pain Orientation Right;Left   Pain Descriptors / Indicators Burning  feet burning and achey sharp pain for Lt knee   Pain Type Chronic pain   Pain Onset More than a month ago   Pain Frequency Constant   Aggravating Factors  working   Pain Relieving Factors rest   Effect of Pain on Daily Activities pushes through as able                         [facility] Adult PT Treatment/Exercise - 12/23/16 0001      Exercises   Exercises Knee/Hip     Knee/Hip Exercises: Aerobic   Nustep 5' L3 at EOS (not included with charges)     Knee/Hip Exercises: Standing   Forward Lunges Both;2 sets;10 reps   Forward Lunges Limitations 8in step wiht cueing for mechanics   Functional Squat 5 reps     Knee/Hip Exercises: Seated   Sit to Sand 5 reps;without UE support  eccentric control, reports increased knee pain  Balance Exercises - 12/23/16 1525      Balance Exercises: Standing   Tandem Stance Eyes open;Foam/compliant surface;3 reps;30 secs  noted increased LOB with increased weight bearing Lt LE   SLS Eyes open;3 reps   Tandem Gait 2 reps;Forward   Sidestepping 2 reps;Theraband  RTB no HHA   Marching Limitations 10x 5" holds on foam intermittent HHA   Heel Raises Limitations 15   Toe Raise Limitations 15   Sit to Stand Time 5x with reports of increased knee pain   Other Standing Exercises partial tandem stance with Rt LE on 8in step and 10x UE flexion             PT Short Term Goals - 12/16/16 1610      PT SHORT TERM GOAL #1   Title Patient to be compliant with correct performance of HEP, to be updated PRN    Time 1   Period Weeks   Status On-going     PT SHORT TERM GOAL #2   Title Patient to be educated and participatory in formation of exercise habits, and  will report that he has been working on an independent exercise program at least 2x/week in order to improve physical fitness and assist in reducing pain    Time 2   Period Weeks   Status On-going           PT Long Term Goals - 12/16/16 1610      PT LONG TERM GOAL #1   Title Patient to score 5/5 in all tested muscle groups via MMT in order to improve floor to stand and functional balance    Time 4   Period Weeks   Status On-going     PT LONG TERM GOAL #2   Title Patient score 24/24 on DGI in order to show improved functional balance/reduced fall risk    Time 4   Period Weeks   Status On-going     PT LONG TERM GOAL #3   Title Patient to be able to complete floor to stand transfer with RPE no more than 3/10 and no shortness of breath on a modified independent basis in order to show improved functional status    Time 4   Period Weeks   Status On-going     PT LONG TERM GOAL #4   Title Patient to report no dizziness with head turns in order to show improvement of symptoms and reduced fall risk    Time 4   Period Weeks   Status On-going     PT LONG TERM GOAL #5   Title Patient to be participatory in regular exercise program, at least 10-15 minutes in duration and 3 days per week, in order to maintain functional gains and promote improved health status    Time 4   Period Weeks   Status On-going               Plan - 12/23/16 1614    Clinical Impression Statement Session foucs on balance activities and functional strengthening with CKC.  Pt does continues to c/o knee pain with weight bearing, monitored through session.  Good balance noted with tandem gait this session wiht independent recovery with LOB episodes, ready for dynamic next session (if able to tolerate with knee pain and neuropathy).     Rehab Potential Fair   Clinical Impairments Affecting Rehab Potential (+) reports he is motivated to try PT; (-) confession he will probably not do his HEP at home, obesity,  chronicity of pain, neuropathy    PT Frequency 2x / week   PT Duration 4 weeks   PT Treatment/Interventions ADLs/Self Care Home Management;Canalith Repostioning;Biofeedback;Moist Heat;Ultrasound;Gait training;Stair training;Functional mobility training;Therapeutic activities;Therapeutic exercise;Balance training;Neuromuscular re-education;Patient/family education;Manual techniques;Energy conservation;Taping;Vestibular   PT Next Visit Plan ; balance and advacned functional strength.  Progress to balance beam for tandem gait next session.     PT Home Exercise Plan give 2nd session (time limitations at eval); heel raises/toe raises; Single leg stance and eye exercises       Patient will benefit from skilled therapeutic intervention in order to improve the following deficits and impairments:  Abnormal gait, Improper body mechanics, Pain, Decreased coordination, Postural dysfunction, Decreased activity tolerance, Decreased strength, Decreased balance  Visit Diagnosis: Unsteadiness on feet  Muscle weakness (generalized)  Other symptoms and signs involving the musculoskeletal system  Other symptoms and signs involving the nervous system     Problem List Patient Active Problem List   Diagnosis Date Noted  . Confusion 09/17/2016  . Chronic migraine 09/17/2016  . Gait abnormality 09/17/2016  . Diabetic foot infection ([place]) 06/28/2016  . Screening examination for venereal disease 02/11/2016  . Critical lower limb ischemia 12/07/2015  . Fall 12/05/2015  . Toe ulcer, right ([place]) 09/19/2015  . Decreased pedal pulses 09/19/2015  . OSA on CPAP 09/05/2015  . Major depressive disorder, recurrent episode, moderate ([place]) 09/05/2015  . OSA (obstructive sleep apnea) 07/25/2015  . Passed out [facility]) 06/28/2015  . Low back pain 06/11/2015  . Abnormality of gait 06/11/2015  . Dizziness 03/17/2015  . Weakness 02/21/2015  . Chronic renal insufficiency, stage III (moderate) ([place]) 08/09/2014  . Diabetes  ([place]) 11/07/2013  . Hematuria 06/21/2013  . Hepatic steatosis 09/09/2010  . UTI 09/15/2006  . PYURIA 09/03/2006  . Human immunodeficiency virus (HIV) disease ([place]) 06/04/2006  . HERPES ZOSTER, UNCOMPLICATED 93/71/6967  . HSV 06/04/2006  . Depression 06/04/2006  . DISORDER, ATTENTION DEFICIT W/HYPERACTIVITY 06/04/2006  . THROMBOPHLEBITIS NOS 06/04/2006  . GERD 06/04/2006  . ARTHRITIS, HAND 06/04/2006  . HYPERGLYCEMIA, HX OF 06/04/2006  . HEPATITIS B, HX OF 06/04/2006   Ihor Austin, LPTA; [place]  Aldona Lento 12/23/2016, 4:28 PM  [place] [address] [place], [place], [address] Phone: [phone]   Fax:  [phone]  Name: Daniel [name] MRN: [id number] Date of Birth: 04-08-1955

## 2016-12-25 ENCOUNTER — Encounter: Payer: Self-pay | Admitting: Family Medicine

## 2016-12-25 ENCOUNTER — Ambulatory Visit (INDEPENDENT_AMBULATORY_CARE_PROVIDER_SITE_OTHER): Payer: BLUE CROSS/BLUE SHIELD

## 2016-12-25 ENCOUNTER — Ambulatory Visit (HOSPITAL_COMMUNITY): Payer: BLUE CROSS/BLUE SHIELD | Attending: Otolaryngology

## 2016-12-25 ENCOUNTER — Ambulatory Visit (INDEPENDENT_AMBULATORY_CARE_PROVIDER_SITE_OTHER): Payer: BLUE CROSS/BLUE SHIELD | Admitting: Family Medicine

## 2016-12-25 ENCOUNTER — Encounter (HOSPITAL_COMMUNITY): Payer: Self-pay

## 2016-12-25 VITALS — BP 100/60 | HR 100 | Temp 98.6°F | Resp 18 | Ht 70.5 in | Wt 250.0 lb

## 2016-12-25 DIAGNOSIS — R29818 Other symptoms and signs involving the nervous system: Secondary | ICD-10-CM | POA: Insufficient documentation

## 2016-12-25 DIAGNOSIS — R0600 Dyspnea, unspecified: Secondary | ICD-10-CM

## 2016-12-25 DIAGNOSIS — R600 Localized edema: Secondary | ICD-10-CM

## 2016-12-25 DIAGNOSIS — R269 Unspecified abnormalities of gait and mobility: Secondary | ICD-10-CM | POA: Diagnosis not present

## 2016-12-25 DIAGNOSIS — R29898 Other symptoms and signs involving the musculoskeletal system: Secondary | ICD-10-CM | POA: Insufficient documentation

## 2016-12-25 DIAGNOSIS — R2681 Unsteadiness on feet: Secondary | ICD-10-CM | POA: Diagnosis present

## 2016-12-25 DIAGNOSIS — R42 Dizziness and giddiness: Secondary | ICD-10-CM | POA: Diagnosis not present

## 2016-12-25 DIAGNOSIS — R0609 Other forms of dyspnea: Secondary | ICD-10-CM

## 2016-12-25 DIAGNOSIS — G6289 Other specified polyneuropathies: Secondary | ICD-10-CM

## 2016-12-25 DIAGNOSIS — M6281 Muscle weakness (generalized): Secondary | ICD-10-CM | POA: Insufficient documentation

## 2016-12-25 DIAGNOSIS — N189 Chronic kidney disease, unspecified: Secondary | ICD-10-CM

## 2016-12-25 NOTE — Patient Instructions (Addendum)
For shortness of breath, that may be due in part to deconditioning or weight, but may be worth meeting again with cardiology to see if other testing may be needed.   Keep appointment for leg ultrasound, but less likely blood clot since you are still taking Xarelto.   I will refer you to cardiology and pulmonary to discuss shortness of breath.   I am concerned that your dizziness and symptoms with leaning forward may be due to your medications. Please discuss these symptoms with your psychiatrist to see if some of the combinations may need to be adjusted. Additionally the higher dose of Lyrica can cause dizziness.  You can try to change back to 73m Lyrica during the day, then 1047mat night if neuropathic pain is worse then. I will also repeat kidney function at this higher dose, as well as check your blood counts. If Lyrica is not helping the foot burning, I would like you to meet with neurology to discuss other possible treatments as I am concerned about worsening dizziness/sedation with higher doses or additional medication.   Keep a record of your blood pressures outside of the office and if you have any readings under 100 on the top number, especially if associated dizziness - I recommend going to the emergency room.   Return to the clinic or go to the nearest emergency room if any of your symptoms worsen or new symptoms occur.  Follow up in 2 weeks for dizziness.     IF you received an x-ray today, you will [facility]. Please contact [facility] at [phone] questions or concerns regarding your invoice.   IF you received labwork today, you will receive an invoice from [place] at [phone] questions or concerns regarding your invoice.   Our billing staff will not be able to assist you with questions regarding bills from these companies.  You will be contacted with the lab results as soon as they are  available. The fastest way to get your results is to activate your My Chart account. Instructions are located on the last page of this paperwork. If you have not heard from [facility] the results in 2 weeks, please contact this office.

## 2016-12-25 NOTE — Progress Notes (Signed)
Subjective:  By signing my name below, I, Daniel [name], attest that this documentation has been prepared under the direction and in the presence of Daniel Ray, MD. Electronically Signed: [name], [place]. 12/25/2016 , 6:46 PM .  Patient was seen in Room 9 .   Patient ID: Daniel [name], male    DOB: Apr 02, 1955, 61 y.o.   MRN: [id number] Chief Complaint  Patient presents with  . Follow-up    patient did not have leg scan, has rescheduled for this week   HPI Daniel [name] is a 61 y.o. male  Here for follow up. He has a history of multiple medical problems. He was most recently seen on Oct 18th.   Over 40 minutes of face-to-face care including records reviewed in room, as well as discussion of symptoms and plan; greater than 50% counseling.   Dizziness and falls He was seen by his neurologist, Dr. Krista [name], on Aug 30th, thought to have multiple causes. Recommended hydration and monitoring [name] sugar, thought peripheral neuropathy may be contributing. He is also in outpatient rehab for his unsteadiness; most recent session today.   His initial BP was slightly low at check in today. He did report some dizziness but similar symptoms to what he had in the past. His BP was 122/68 at last visit. He is not currently on any antihypertensives. His CBC was normal in May, except for borderline low platelets at 111.   When he leans over, he feels more dizzy. He doesn't feel the dizziness when he turns his head, so believed not vertigo. Feels like he may fall forward if leaning over. Denies recent changes in psychotropics. Feels that his symptoms were the same prior to change in Lyrica dosing. Does admit he may not drink enough fluids throughout the day.  Medications He is prescribed Adderall, Trintellix, and Ambien from psychiatry. He is down to 74m from 29mfor Ambien.  He also takes Depakote at bed time for headaches.  He is also taking 1-2 tablets of xanax a day, and some days not taking  any.   He reports having constipation at times. He denies any ulcers since he was [age]ears old. He denies any alcohol use.    Peripheral neuropathy He mentioned increase in foot pain at last visit. He was taking Lyrica 7585mID; recommended trial of 100m15mD.   He reports, on average, his bilateral foot pain improved with 100mg92m. But there are some nights, where he reports his foot pain worsens. He tried taking a second dose of Lyrica 100mg 14might a few nights ago.    Right leg swelling He has a history of DVT in Jan 2017. On Aug 4th, 2017, he had lower extremity arterial doppler with triphasic waveforms throughout, but not listed for right ATA distal. ABI in Aug 2017, right 1.32 and left 1.19. He has been continued on Xarelto 15mg Q64mltrasound was ordered for lower extremity swelling, R>L. Of note, he is still followed by podiatry, Daniel [name],Daniel Kettleoot ulcers and wounds. Vascular ABI were non-compressible at the toes. He never saw vascular surgeon for this.   His ultrasound was rescheduled for tomorrow. His swelling has been about the same. He hasn't met with his cardiologist recently about his leg swelling. His last visit with cardiology was in 2017 for stress test.   Dyspnea on exertion Reportedly long standing symptoms of dyspnea. He had a BNP normal at 31 at last visit. He has history of chronic kidney disease  with creatinine 1.37 at last visit, stable from 1 month prior. He had a normal stress test, low risk study, and Lexican with mild cardio perfusion in May 2017 with Dr. [name]. He denies any change in shortness of breath over a year and a half. He had chest xray done in May, which showed low lung volumes, cardiomegaly, and bibasilar atelectasis. He smoked cigars in the past. He also inhaled a lot of second hand cigarette smoke, as he worked in [employer] for a long time.   He mentions once having to jog through [place] airport in Oct [place]st 2016 because his first flight came  in late, and trying to catch next flight as they were at final call; this was the worst episode of dyspnea.   DM He is still followed by endocrinologist for diabetes.   HIV He is followed by infectious disease for HIV.   Patient Active Problem List   Diagnosis Date Noted  . Confusion 09/17/2016  . Chronic migraine 09/17/2016  . Gait abnormality 09/17/2016  . Diabetic foot infection ([place]) 06/28/2016  . Screening examination for venereal disease 02/11/2016  . Critical lower limb ischemia 12/07/2015  . Fall 12/05/2015  . Toe ulcer, right ([place]) 09/19/2015  . Decreased pedal pulses 09/19/2015  . OSA on CPAP 09/05/2015  . Major depressive disorder, recurrent episode, moderate ([place]) 09/05/2015  . OSA (obstructive sleep apnea) 07/25/2015  . Passed out [facility]) 06/28/2015  . Low back pain 06/11/2015  . Abnormality of gait 06/11/2015  . Dizziness 03/17/2015  . Weakness 02/21/2015  . Chronic renal insufficiency, stage III (moderate) ([place]) 08/09/2014  . Diabetes ([place]) 11/07/2013  . Hematuria 06/21/2013  . Hepatic steatosis 09/09/2010  . UTI 09/15/2006  . PYURIA 09/03/2006  . Human immunodeficiency virus (HIV) disease ([place]) 06/04/2006  . HERPES ZOSTER, UNCOMPLICATED 50/10/3816  . HSV 06/04/2006  . Depression 06/04/2006  . DISORDER, ATTENTION DEFICIT W/HYPERACTIVITY 06/04/2006  . THROMBOPHLEBITIS NOS 06/04/2006  . GERD 06/04/2006  . ARTHRITIS, HAND 06/04/2006  . HYPERGLYCEMIA, HX OF 06/04/2006  . HEPATITIS B, HX OF 06/04/2006   Past Medical History:  Diagnosis Date  . ADHD (attention deficit hyperactivity disorder)   . Anxiety   . Chronic kidney disease   . Clotting disorder ([place])   . Depression   . Diabetes mellitus without complication ([place])   . Diabetes mellitus, type II ([place])   . Dizziness 03/17/2015  . GERD (gastroesophageal reflux disease)   . HIV disease ([place])   . HIV infection ([place])   . Liver disease   . OSA (obstructive sleep apnea) 07/25/2015   Uses CPAP regularly    . Peripheral vascular disease ([place])   . Ulcer    Past Surgical History:  Procedure Laterality Date  . SMALL INTESTINE SURGERY    . STOMACH SURGERY    . TOE AMPUTATION Right 08/2016   right great toe   Allergies  Allergen Reactions  . Aspirin Swelling  . Ibuprofen Swelling  . Sustiva [Efavirenz] Swelling and Rash    And rash.  . Nsaids Other (See Comments)    unknwn   Prior to Admission medications   Medication Sig Start Date End Date Taking? Authorizing Provider  acetaminophen (TYLENOL) 500 MG tablet Take 1,000 mg by mouth every 8 (eight) hours as needed for moderate pain.     [provider]  ALPRAZolam Duanne Moron) 1 MG tablet Take 1 mg by mouth 3 (three) times daily as needed for anxiety.     [provider]  amphetamine-dextroamphetamine (ADDERALL) 30 MG tablet Take 30 mg by mouth 3 (three) times daily.     [provider]  atorvastatin (LIPITOR) 10 MG tablet Take 1 tablet (10 mg total) by mouth daily. 10/20/16   [name], MD  diclofenac sodium (VOLTAREN) 1 % GEL APPLY 2 GRAMS TO EACH KNEE IN THE MORNING AND AT BEDTIME AND APPLY 1 GRAM TO EACH KNEE IN THE AFTERNOON. 11/26/16   [name], MD  divalproex (DEPAKOTE ER) 500 MG 24 hr tablet Take 1 tablet (500 mg total) by mouth at bedtime. 12/05/15   Marcial Pacas, MD  insulin aspart (NOVOLOG FLEXPEN) 100 UNIT/ML FlexPen 3 times a day (just before each meal) 06-28-13 units 10/10/16   Renato Shin, MD  Insulin Glargine (LANTUS SOLOSTAR) 100 UNIT/ML Solostar Pen Inject 75 Units into the skin every morning. 10/10/16   Renato Shin, MD  Insulin Pen Needle (PEN NEEDLES 3/16") 31G X 5 MM MISC 1 Device by Other route 4 (four) times daily. Use to inject Lantus daily. 12/25/15   Renato Shin, MD  levothyroxine (SYNTHROID, LEVOTHROID) 50 MCG tablet Take 1 tablet (50 mcg total) by mouth at bedtime. 07/24/15   [name], MD  ondansetron (ZOFRAN-ODT) 4 MG disintegrating tablet Take 2 tablets (8 mg total) by  mouth every 8 (eight) hours as needed for nausea or vomiting. 02/11/16   Comer, Okey Regal, MD  pregabalin (LYRICA) 100 MG capsule Take 1 capsule (100 mg total) by mouth 3 (three) times daily. 12/11/16   [name], MD  protriptyline (VIVACTIL) 10 MG tablet Take 10 mg by mouth 3 (three) times daily.  01/30/16   [provider]  ranitidine (ZANTAC) 150 MG tablet Take 150 mg by mouth daily.    [provider]  REXULTI 1 MG TABS  11/10/16   [provider]  SURE COMFORT PEN NEEDLES 31G X 8 MM MISC USE TO INJECT LANTUS AND NOVOLOG DAILY. 12/22/16   Renato Shin, MD  TRINTELLIX 20 MG TABS Take 20 mg by mouth at bedtime. 01/26/15   [provider]  TRIUMEQ 600-50-300 MG tablet TAKE 1 TABLET BY MOUTH DAILY 04/02/16   Comer, Okey Regal, MD  UNABLE TO FIND CPAP MACHINE with standard Aclaim nasal mask with humidifier. Set at 14 cwp 04/12/13   Daub, Loura Back, MD  XARELTO 15 MG TABS tablet TAKE ONE TABLET BY MOUTH DAILY. 11/15/16   [name], MD  zolpidem (AMBIEN) 10 MG tablet Take 10-20 mg by mouth at bedtime as needed for sleep.     [provider]   Social History   Social History  . Marital status: Single    Spouse name: N/A  . Number of children: N/A  . Years of education: N/A   Occupational History  . Not on file.   Social History Main Topics  . Smoking status: Former Smoker    Packs/day: 0.10    Years: 10.00    Types: Cigars, Cigarettes    Quit date: 08/09/2014  . Smokeless tobacco: Never Used  . Alcohol use No  . Drug use: No  . Sexual activity: Not Currently    Partners: Male     Comment: pt. declined condoms   Other Topics Concern  . Not on file   Social History Narrative   Epworth Sleepiness Scale = 7 (as of 03/16/2015)   Review of Systems  Constitutional: Negative for fatigue and unexpected weight change.  Eyes: Negative for visual disturbance.  Respiratory: Positive  for shortness of breath. Negative for cough and chest  tightness.   Cardiovascular: Positive for leg swelling. Negative for chest pain and palpitations.  Gastrointestinal: Negative for abdominal pain and [name] in stool.  Musculoskeletal: Positive for myalgias.  Neurological: Positive for dizziness and light-headedness. Negative for headaches.       Objective:   Physical Exam  Constitutional: He is oriented to person, place, and time. He appears well-developed and well-nourished.  HENT:  Head: Normocephalic and atraumatic.  Eyes: Pupils are equal, round, and reactive to light. EOM are normal.  Neck: No JVD present. Carotid bruit is not present.  Cardiovascular: Normal rate, regular rhythm and normal heart sounds.   No murmur heard. Pulmonary/Chest: Effort normal and breath sounds normal. He has no rales.  Musculoskeletal: He exhibits no edema.  Pitting edema to the mid tibia on right, and to lower tibia on left  Neurological: He is alert and oriented to person, place, and time.  Skin: Skin is warm and dry.  Psychiatric: He has a normal mood and affect.  Vitals reviewed.   Vitals:   12/25/16 1714 12/25/16 1754 12/25/16 1755  BP: (!) 92/58 110/60 100/60  Pulse: 100    Resp: 18    Temp: 98.6 F (37 C)    TempSrc: Oral    SpO2: 96%    Weight: 250 lb (113.4 kg)    Height: 5' 10.5" (1.791 m)    Dg Chest 2 View  Result Date: 12/25/2016 CLINICAL DATA:  Chronic dyspnea. EXAM: CHEST  2 VIEW COMPARISON:  06/27/2016 FINDINGS: The lungs are clear without focal pneumonia, edema, pneumothorax or pleural effusion. The cardiopericardial silhouette is within normal limits for size. The visualized bony structures of the thorax are intact. IMPRESSION: No active cardiopulmonary disease. Electronically Signed   By: Misty Stanley M.D.   On: 12/25/2016 19:01       Assessment & Plan:  NASIRE REALI is a 61 y.o. male Pedal edema - Plan: Ambulatory referral to Cardiology DOE (dyspnea on exertion) - Plan: Ambulatory referral to Cardiology, Ambulatory  referral to Pulmonology, DG Chest 2 [place] initially. Previous vascular studies did not appear to indicate significant large vessel disease with PVD, but small vessels at foot. History of foot ulcers, followed by podiatry. He did have cardiomegaly on chest x-[name] earlier this year, but chest x-[name] in office overall reassuring.  Ultrasound on 12/26/16 also negative for DVT  -Will refer to cardiology as well as pulmonary for discussion of other investigations for his DOE. Does report prior second hand smoke exposure and cigar smoking. May also be related to deconditioning.   Other polyneuropathy  -Peripheral neuropathy, some improvement at 100 mg Lyrica dose. Repeat creatinine at this dose, options and adjusting dose to see if that affects his dizziness. If persistent pain at this dose or worsening, would recommend follow-up with neurology to discuss other changes  Chronic kidney disease, unspecified CKD stage - Plan: Basic metabolic panel  - Repeat BMP. Increase hydration  Dizziness - Plan: CBC  -May be multifactorial, repeat CBC, BMP as above. Increase fluid intake. Concerns were discussed with the multiple psychotropics and sedating medicines, and asked that he discuss his dizziness symptoms with his psychiatrist to see if any changes may be needed. Recheck in 2 weeks.  No orders of the defined types were placed in this encounter.  Patient Instructions   For shortness of breath, that may be due in part to deconditioning or weight, but may be worth meeting  again with cardiology to see if other testing may be needed.   Keep appointment for leg ultrasound, but less likely [name] clot since you are still taking Xarelto.   I will refer you to cardiology and pulmonary to discuss shortness of breath.   I am concerned that your dizziness and symptoms with leaning forward may be due to your medications. Please discuss these symptoms with your psychiatrist to see if some of the combinations may  need to be adjusted. Additionally the higher dose of Lyrica can cause dizziness.  You can try to change back to 97m Lyrica during the day, then 1027mat night if neuropathic pain is worse then. I will also repeat kidney function at this higher dose, as well as check your [name] counts. If Lyrica is not helping the foot burning, I would like you to meet with neurology to discuss other possible treatments as I am concerned about worsening dizziness/sedation with higher doses or additional medication.   Keep a record of your [name] pressures outside of the office and if you have any readings under 100 on the top number, especially if associated dizziness - I recommend going to the emergency room.   Return to the clinic or go to the nearest emergency room if any of your symptoms worsen or new symptoms occur.  Follow up in 2 weeks for dizziness.     IF you received an x-[name] today, you will receive an invoice from [facility]. Please contact [facility] at [phone] questions or concerns regarding your invoice.   IF you received labwork today, you will receive an invoice from [facility] at [phone] questions or concerns regarding your invoice.   Our billing staff will not be able to assist you with questions regarding bills from these companies.  You will be contacted with the lab results as soon as they are available. The fastest way to get your results is to activate your My Chart account. Instructions are located on the last page of this paperwork. If you have not heard from us[place] the results in 2 weeks, please contact this office.      I personally performed the services described in this documentation, which was scribed in my presence. The recorded information has been reviewed and considered for accuracy and completeness, addended by me as needed, and agree with information above.  Signed,   JeMerri RayMD [facility] 12/27/16 1:10 PM

## 2016-12-25 NOTE — Therapy (Signed)
[place], [place], [address] Phone: [phone]   Fax:  [phone]  Physical Therapy Treatment  Patient Details  Name: Daniel [name] MRN: [id number] Date of Birth: 12/15/1955 Referring Provider: Melida Quitter   Encounter Date: 12/25/2016      PT End of Session - 12/25/16 1439    Visit Number 5   Number of Visits 9   Date for PT Re-Evaluation 01/07/17   Authorization Type [employer] Other (covered 100%); 30 visit limit 4 used at eval    Authorization Time Period 12/10/16 to 01/10/17   Authorization - Visit Number 5   Authorization - Number of Visits 24   PT Start Time 9574   PT Stop Time 1509   PT Time Calculation (min) 34 min   Equipment Utilized During Treatment Gait belt   Activity Tolerance Patient tolerated treatment well  pt c/o shaking thinking blood sugar drop, sat and given candy and orange juice.   Behavior During Therapy [facility] for tasks assessed/performed      Past Medical History:  Diagnosis Date  . ADHD (attention deficit hyperactivity disorder)   . Anxiety   . Chronic kidney disease   . Clotting disorder ([place])   . Depression   . Diabetes mellitus without complication ([place])   . Diabetes mellitus, type II ([place])   . Dizziness 03/17/2015  . GERD (gastroesophageal reflux disease)   . HIV disease ([place])   . HIV infection ([place])   . Liver disease   . OSA (obstructive sleep apnea) 07/25/2015   Uses CPAP regularly  . Peripheral vascular disease ([place])   . Ulcer     Past Surgical History:  Procedure Laterality Date  . SMALL INTESTINE SURGERY    . STOMACH SURGERY    . TOE AMPUTATION Right 08/2016   right great toe    There were no vitals filed for this visit.      Subjective Assessment - 12/25/16 1435    Subjective Pt stated he is feeling good today, no reports of pain today.  Reports he had a close call LOB setting down tiles at home.  Reports he checked blood sugar with 158 mmol/L today.   Pertinent History thrombophlebitis, DM, depression, ADHD, anxiety, hx Hep B, history LBP, hx HIV infection, toe amputation 7/18   Patient Stated Goals "get knees replaced, get back worked on, work on my balance"    Currently in Pain? No/denies                         [facility] Adult PT Treatment/Exercise - 12/25/16 0001      Knee/Hip Exercises: Standing   Functional Squat 10 reps   Functional Squat Limitations good mechanics noted             Balance Exercises - 12/25/16 1502      Balance Exercises: Standing   Tandem Stance Eyes open;Foam/compliant surface;3 reps;30 secs   SLS Eyes open;3 reps   Balance Beam tandem and side step 1RT   Heel Raises Limitations 15x no HHA 3-5" holds   Toe Raise Limitations 15 with minimal HHA on incline slope           PT Education - 12/25/16 1440    Education provided Yes   Education Details Educated importance of complaince iwht HEP for maximal beneftis, encouraged to increase    Person(s) Educated Patient   Methods Explanation   Comprehension Verbalized understanding;Need further instruction  PT Short Term Goals - 12/16/16 1610      PT SHORT TERM GOAL #1   Title Patient to be compliant with correct performance of HEP, to be updated PRN    Time 1   Period Weeks   Status On-going     PT SHORT TERM GOAL #2   Title Patient to be educated and participatory in formation of exercise habits, and will report that he has been working on an independent exercise program at least 2x/week in order to improve physical fitness and assist in reducing pain    Time 2   Period Weeks   Status On-going           PT Long Term Goals - 12/16/16 1610      PT LONG TERM GOAL #1   Title Patient to score 5/5 in all tested muscle groups via MMT in order to improve floor to stand and functional balance    Time 4   Period Weeks   Status On-going     PT LONG TERM GOAL #2   Title Patient score 24/24 on DGI in order to show  improved functional balance/reduced fall risk    Time 4   Period Weeks   Status On-going     PT LONG TERM GOAL #3   Title Patient to be able to complete floor to stand transfer with RPE no more than 3/10 and no shortness of breath on a modified independent basis in order to show improved functional status    Time 4   Period Weeks   Status On-going     PT LONG TERM GOAL #4   Title Patient to report no dizziness with head turns in order to show improvement of symptoms and reduced fall risk    Time 4   Period Weeks   Status On-going     PT LONG TERM GOAL #5   Title Patient to be participatory in regular exercise program, at least 10-15 minutes in duration and 3 days per week, in order to maintain functional gains and promote improved health status    Time 4   Period Weeks   Status On-going               Plan - 12/25/16 1614    Clinical Impression Statement Continued session focus primarly balance activities.  Progressed to dynamic surface tandem gait and abduction with min A for safety with LOB episodes.  Pt presents with good mechanics wiht functional squats this session and no reports of pain through session.  EOS pt reports he felt shaky, pt had a seat and was given orange juice and candy.  EOS pt reports he feels a lot better and no longer shaky.     Rehab Potential Fair   Clinical Impairments Affecting Rehab Potential (+) reports he is motivated to try PT; (-) confession he will probably not do his HEP at home, obesity, chronicity of pain, neuropathy    PT Frequency 2x / week   PT Duration 4 weeks   PT Treatment/Interventions ADLs/Self Care Home Management;Canalith Repostioning;Biofeedback;Moist Heat;Ultrasound;Gait training;Stair training;Functional mobility training;Therapeutic activities;Therapeutic exercise;Balance training;Neuromuscular re-education;Patient/family education;Manual techniques;Energy conservation;Taping;Vestibular   PT Next Visit Plan ; balance and  advacned functional strength.  Begin vector stance next session.       PT Home Exercise Plan give 2nd session (time limitations at eval); heel raises/toe raises; Single leg stance and eye exercises; 11/01: tandem stnace and SLS      Patient will benefit from skilled therapeutic  intervention in order to improve the following deficits and impairments:  Abnormal gait, Improper body mechanics, Pain, Decreased coordination, Postural dysfunction, Decreased activity tolerance, Decreased strength, Decreased balance  Visit Diagnosis: Unsteadiness on feet  Muscle weakness (generalized)  Other symptoms and signs involving the musculoskeletal system  Other symptoms and signs involving the nervous system     Problem List Patient Active Problem List   Diagnosis Date Noted  . Confusion 09/17/2016  . Chronic migraine 09/17/2016  . Gait abnormality 09/17/2016  . Diabetic foot infection ([place]) 06/28/2016  . Screening examination for venereal disease 02/11/2016  . Critical lower limb ischemia 12/07/2015  . Fall 12/05/2015  . Toe ulcer, right ([place]) 09/19/2015  . Decreased pedal pulses 09/19/2015  . OSA on CPAP 09/05/2015  . Major depressive disorder, recurrent episode, moderate ([place]) 09/05/2015  . OSA (obstructive sleep apnea) 07/25/2015  . Passed out [facility]) 06/28/2015  . Low back pain 06/11/2015  . Abnormality of gait 06/11/2015  . Dizziness 03/17/2015  . Weakness 02/21/2015  . Chronic renal insufficiency, stage III (moderate) ([place]) 08/09/2014  . Diabetes ([place]) 11/07/2013  . Hematuria 06/21/2013  . Hepatic steatosis 09/09/2010  . UTI 09/15/2006  . PYURIA 09/03/2006  . Human immunodeficiency virus (HIV) disease ([place]) 06/04/2006  . HERPES ZOSTER, UNCOMPLICATED 41/58/3094  . HSV 06/04/2006  . Depression 06/04/2006  . DISORDER, ATTENTION DEFICIT W/HYPERACTIVITY 06/04/2006  . THROMBOPHLEBITIS NOS 06/04/2006  . GERD 06/04/2006  . ARTHRITIS, HAND 06/04/2006  . HYPERGLYCEMIA, HX OF 06/04/2006   . HEPATITIS B, HX OF 06/04/2006   Ihor Austin, LPTA; [address] [place]  Aldona Lento 12/25/2016, 5:33 PM  [place] [address] [place], [place], [address] Phone: [phone]   Fax:  [phone]  Name: Daniel [name] MRN: [id number] Date of Birth: [birth year]

## 2016-12-25 NOTE — Patient Instructions (Signed)
Tandem Stance    Right foot in front of left, heel touching toe both feet "straight ahead". Stand on Foot Triangle of Support with both feet. Balance in this position 30 seconds. Do with left foot in front of right.  Copyright  VHI. All rights reserved.   SINGLE LIMB STANCE    Stance: single leg on floor. Raise leg. Hold 30 seconds. Repeat with other leg. 5 reps per set, 1-2 sets every day.  Copyright  VHI. All rights reserved.

## 2016-12-26 ENCOUNTER — Ambulatory Visit (HOSPITAL_COMMUNITY)
Admission: RE | Admit: 2016-12-26 | Discharge: 2016-12-26 | Disposition: A | Discharge status: Home / Self Care | Payer: BLUE CROSS/BLUE SHIELD | Source: Ambulatory Visit | Attending: Family Medicine | Admitting: Family Medicine

## 2016-12-26 ENCOUNTER — Other Ambulatory Visit: Payer: Self-pay | Admitting: Family Medicine

## 2016-12-26 DIAGNOSIS — M7989 Other specified soft tissue disorders: Secondary | ICD-10-CM | POA: Diagnosis present

## 2016-12-26 DIAGNOSIS — I82409 Acute embolism and thrombosis of unspecified deep veins of unspecified lower extremity: Secondary | ICD-10-CM

## 2016-12-26 LAB — BASIC METABOLIC PANEL
BUN / CREAT RATIO: 12 (ref 10–24)
BUN: 17 mg/dL (ref 8–27)
CALCIUM: 9.3 mg/dL (ref 8.6–10.2)
CO2: 22 mmol/L (ref 20–29)
Chloride: 103 mmol/L (ref 96–106)
Creatinine, Ser: 1.4 mg/dL — ABNORMAL HIGH (ref 0.76–1.27)
GFR, EST AFRICAN AMERICAN: 61 mL/min/{1.73_m2} (ref >59)
GFR, EST NON AFRICAN AMERICAN: 53 mL/min/{1.73_m2} — AB (ref >59)
Glucose: 338 mg/dL — ABNORMAL HIGH (ref 65–99)
POTASSIUM: 4.6 mmol/L (ref 3.5–5.2)
Sodium: 141 mmol/L (ref 134–144)

## 2016-12-26 LAB — CBC
Hematocrit: 44.3 % (ref 37.5–51.0)
Hemoglobin: 15.2 g/dL (ref 13.0–17.7)
MCH: 32 pg (ref 26.6–33.0)
MCHC: 34.3 g/dL (ref 31.5–35.7)
MCV: 93 fL (ref 79–97)
PLATELETS: 152 10*3/uL (ref 150–379)
RBC: 4.75 x10E6/uL (ref 4.14–5.80)
RDW: 14.4 % (ref 12.3–15.4)
WBC: 8.2 10*3/uL (ref 3.4–10.8)

## 2016-12-26 NOTE — Telephone Encounter (Signed)
Please advise 

## 2016-12-26 NOTE — Progress Notes (Addendum)
Left lower extremity venous duplex completed No evidence of DVT,SVT, or Baker's Cyst  [facility], RVS 12/26/2016

## 2016-12-27 NOTE — Telephone Encounter (Signed)
Changed to 175m Lyrica on 10/18. Planned follow up in next few weeks.

## 2016-12-30 ENCOUNTER — Telehealth (HOSPITAL_COMMUNITY): Payer: Self-pay

## 2016-12-30 ENCOUNTER — Ambulatory Visit (HOSPITAL_COMMUNITY): Payer: BLUE CROSS/BLUE SHIELD

## 2016-12-30 NOTE — Telephone Encounter (Signed)
Patient changed his appt to 12-31-16 at 4:00 with Amy

## 2016-12-31 ENCOUNTER — Ambulatory Visit (HOSPITAL_COMMUNITY): Payer: BLUE CROSS/BLUE SHIELD | Admitting: Physical Therapy

## 2016-12-31 ENCOUNTER — Ambulatory Visit: Payer: BLUE CROSS/BLUE SHIELD | Admitting: Sports Medicine

## 2016-12-31 ENCOUNTER — Telehealth (HOSPITAL_COMMUNITY): Payer: Self-pay | Admitting: Family Medicine

## 2016-12-31 ENCOUNTER — Other Ambulatory Visit: Payer: Self-pay | Admitting: Neurology

## 2016-12-31 NOTE — Telephone Encounter (Signed)
12/31/16  left a message to cancel because he still was not feeling well

## 2017-01-01 ENCOUNTER — Ambulatory Visit (HOSPITAL_COMMUNITY): Payer: BLUE CROSS/BLUE SHIELD

## 2017-01-01 ENCOUNTER — Telehealth (HOSPITAL_COMMUNITY): Payer: Self-pay | Admitting: Family Medicine

## 2017-01-01 NOTE — Telephone Encounter (Signed)
01/01/17  pt called and said he was cancelling because he was still under the weather

## 2017-01-06 ENCOUNTER — Encounter (HOSPITAL_COMMUNITY): Payer: Self-pay | Admitting: Physical Therapy

## 2017-01-06 ENCOUNTER — Telehealth (HOSPITAL_COMMUNITY): Payer: Self-pay | Admitting: Physical Therapy

## 2017-01-06 ENCOUNTER — Ambulatory Visit (HOSPITAL_COMMUNITY): Payer: BLUE CROSS/BLUE SHIELD | Admitting: Physical Therapy

## 2017-01-06 DIAGNOSIS — R2681 Unsteadiness on feet: Secondary | ICD-10-CM | POA: Diagnosis not present

## 2017-01-06 DIAGNOSIS — R29898 Other symptoms and signs involving the musculoskeletal system: Secondary | ICD-10-CM

## 2017-01-06 DIAGNOSIS — M6281 Muscle weakness (generalized): Secondary | ICD-10-CM

## 2017-01-06 NOTE — Therapy (Signed)
[place] [place], [place], [address] Phone: [phone]   Fax:  [phone]  Physical Therapy Treatment (Re-Assessment)  Patient Details  Name: Daniel [name] MRN: [id number] Date of Birth: 05/06/1955 Referring Provider: Melida Quitter    Encounter Date: 01/06/2017  PT End of Session - 01/06/17 1519    Visit Number  6    Number of Visits  14    Date for PT Re-Evaluation  02/03/17    Authorization Type  [employer] Other (covered 100%); 30 visit limit 4 used at eval     Authorization Time Period  12/10/16 to 82/50/53; recert 97/67-34/19     Authorization - Visit Number  6    Authorization - Number of Visits  24    PT Start Time  3790 patient arrived late     PT Stop Time  1511    PT Time Calculation (min)  28 min    Equipment Utilized During Treatment  Gait belt    Activity Tolerance  Patient tolerated treatment well    Behavior During Therapy  [facility] for tasks assessed/performed       Past Medical History:  Diagnosis Date  . ADHD (attention deficit hyperactivity disorder)   . Anxiety   . Chronic kidney disease   . Clotting disorder ([place])   . Depression   . Diabetes mellitus without complication ([place])   . Diabetes mellitus, type II ([place])   . Dizziness 03/17/2015  . GERD (gastroesophageal reflux disease)   . HIV disease ([place])   . HIV infection ([place])   . Liver disease   . OSA (obstructive sleep apnea) 07/25/2015   Uses CPAP regularly  . Peripheral vascular disease ([place])   . Ulcer     Past Surgical History:  Procedure Laterality Date  . SMALL INTESTINE SURGERY    . STOMACH SURGERY    . TOE AMPUTATION Right 08/2016   right great toe    There were no vitals filed for this visit.  Subjective Assessment - 01/06/17 1513    Subjective  Paitent arrives stating he is doing OK, he feels he is getting better but has had some back pain after workin gin the bathroom. He has not been compliant with HEP or going to [facility]. He  continues to have concerns regarding his balance.     Pertinent History  thrombophlebitis, DM, depression, ADHD, anxiety, hx Hep B, history LBP, hx HIV infection, toe amputation 7/18    Patient Stated Goals  "get knees replaced, get back worked on, work on my balance"     Currently in Pain?  Yes    Pain Score  3     Pain Location  Back    Pain Orientation  Left;Right    Pain Descriptors / Indicators  Aching    Pain Type  Chronic pain    Pain Radiating Towards  none     Pain Onset  More than a month ago    Pain Frequency  Constant    Aggravating Factors   working on floor    Pain Relieving Factors  rest     Effect of Pain on Daily Activities  pushes through          [facility] PT Assessment - 01/06/17 0001      Assessment   Medical Diagnosis  balance     Referring Provider  Melida Quitter     Onset Date/Surgical Date  -- chronic     Next MD Visit  none with Dr. Redmond Baseman     Prior Therapy  PT here for his back/toe wound       Precautions   Precautions  Fall      Restrictions   Weight Bearing Restrictions  No      Balance Screen   Has the patient fallen in the past 6 months  Yes    How many times?  unsure     Has the patient had a decrease in activity level because of a fear of falling?   No    Is the patient reluctant to leave their home because of a fear of falling?   No      Prior Function   Level of Independence  Independent;Independent with basic ADLs;Independent with gait;Independent with transfers    Vocation  Retired;On disability      Strength   Overall Strength Comments  floor to stand close standby assist; RPE 5/10 from kneeling position     Right Hip Flexion  5/5    Right Hip ABduction  3/5    Left Hip Flexion  5/5    Left Hip ABduction  4+/5    Right Knee Flexion  5/5    Right Knee Extension  5/5    Left Knee Flexion  5/5    Left Knee Extension  5/5    Right Ankle Dorsiflexion  5/5    Left Ankle Dorsiflexion  5/5      Dynamic Gait Index   Level Surface   Normal    Change in Gait Speed  Normal    Gait with Horizontal Head Turns  Mild Impairment    Gait with Vertical Head Turns  Mild Impairment    Gait and Pivot Turn  Normal    Step Over Obstacle  Normal    Step Around Obstacles  Normal    Steps  Normal    Total Score  22                          PT Education - 01/06/17 1518    Education provided  Yes    Education Details  progress with skilled PT services, POC moving forward, encouragement for compliance with HEP/exercise program     Person(s) Educated  Patient    Methods  Explanation    Comprehension  Verbalized understanding;Need further instruction       PT Short Term Goals - 01/06/17 1520      PT SHORT TERM GOAL #1   Title  Patient to be compliant with correct performance of HEP, to be updated PRN     Time  1    Period  Weeks    Status  On-going      PT SHORT TERM GOAL #2   Title  Patient to be educated and participatory in formation of exercise habits, and will report that he has been working on an independent exercise program at least 2x/week in order to improve physical fitness and assist in reducing pain     Time  2    Period  Weeks    Status  On-going        PT Long Term Goals - 01/06/17 1520      PT LONG TERM GOAL #1   Title  Patient to score 5/5 in all tested muscle groups via MMT in order to improve floor to stand and functional balance     Time  4    Period  Weeks  Status  Partially Met      PT LONG TERM GOAL #2   Title  Patient score 24/24 on DGI in order to show improved functional balance/reduced fall risk     Time  4    Period  Weeks    Status  On-going      PT LONG TERM GOAL #3   Title  Patient to be able to complete floor to stand transfer with RPE no more than 3/10 and no shortness of breath on a modified independent basis in order to show improved functional status     Time  4    Period  Weeks    Status  Partially Met      PT LONG TERM GOAL #4   Title  Patient to  report no dizziness with head turns in order to show improvement of symptoms and reduced fall risk     Time  4    Period  Weeks    Status  On-going      PT LONG TERM GOAL #5   Title  Patient to be participatory in regular exercise program, at least 10-15 minutes in duration and 3 days per week, in order to maintain functional gains and promote improved health status     Time  4    Period  Weeks    Status  On-going            Plan - 01/06/17 1520    Clinical Impression Statement  Re-assessment performed today. Patient appears to be making progress with skilled PT services, And shows objective improvements, however continues to be limited in advanced dynamic balance, functional strength, and in functional task performance such as getting up from the floor easily. He appears interested in continuing with skilled PT services, although his compliance with HEP/exercise recommendations has been lacking. Due to ongoing functional impairments and frequent close calls with falls at home, recommend continuation of skilled PT services moving forward.     Rehab Potential  Fair    Clinical Impairments Affecting Rehab Potential  (+) reports he is motivated to try PT; (-) confession he will probably not do his HEP at home, obesity, chronicity of pain, neuropathy     PT Frequency  2x / week    PT Duration  4 weeks    PT Treatment/Interventions  ADLs/Self Care Home Management;Canalith Repostioning;Biofeedback;Moist Heat;Ultrasound;Gait training;Stair training;Functional mobility training;Therapeutic activities;Therapeutic exercise;Balance training;Neuromuscular re-education;Patient/family education;Manual techniques;Energy conservation;Taping;Vestibular    PT Next Visit Plan  dynamic balance, multitasking, advanced functional strength     PT Home Exercise Plan  give 2nd session (time limitations at eval); heel raises/toe raises; Single leg stance and eye exercises; 11/01: tandem stnace and SLS     Consulted and Agree with Plan of Care  Patient       Patient will benefit from skilled therapeutic intervention in order to improve the following deficits and impairments:  Abnormal gait, Improper body mechanics, Pain, Decreased coordination, Postural dysfunction, Decreased activity tolerance, Decreased strength, Decreased balance  Visit Diagnosis: Unsteadiness on feet - Plan: PT plan of care cert/re-cert  Muscle weakness (generalized) - Plan: PT plan of care cert/re-cert  Other symptoms and signs involving the musculoskeletal system - Plan: PT plan of care cert/re-cert     Problem List Patient Active Problem List   Diagnosis Date Noted  . Confusion 09/17/2016  . Chronic migraine 09/17/2016  . Gait abnormality 09/17/2016  . Diabetic foot infection ([place]) 06/28/2016  . Screening examination for venereal disease 02/11/2016  .  Critical lower limb ischemia 12/07/2015  . Fall 12/05/2015  . Toe ulcer, right ([place]) 09/19/2015  . Decreased pedal pulses 09/19/2015  . OSA on CPAP 09/05/2015  . Major depressive disorder, recurrent episode, moderate ([place]) 09/05/2015  . OSA (obstructive sleep apnea) 07/25/2015  . Passed out [facility]) 06/28/2015  . Low back pain 06/11/2015  . Abnormality of gait 06/11/2015  . Dizziness 03/17/2015  . Weakness 02/21/2015  . Chronic renal insufficiency, stage III (moderate) ([place]) 08/09/2014  . Diabetes ([place]) 11/07/2013  . Hematuria 06/21/2013  . Hepatic steatosis 09/09/2010  . UTI 09/15/2006  . PYURIA 09/03/2006  . Human immunodeficiency virus (HIV) disease ([place]) 06/04/2006  . HERPES ZOSTER, UNCOMPLICATED 65/78/4696  . HSV 06/04/2006  . Depression 06/04/2006  . DISORDER, ATTENTION DEFICIT W/HYPERACTIVITY 06/04/2006  . THROMBOPHLEBITIS NOS 06/04/2006  . GERD 06/04/2006  . ARTHRITIS, HAND 06/04/2006  . HYPERGLYCEMIA, HX OF 06/04/2006  . HEPATITIS B, HX OF 06/04/2006    Deniece Ree PT, DPT, CBIS  Supplemental Physical Therapist [place], [place], [address] Phone: [phone]   Fax:  [phone]  Name: BALDWIN RACICOT MRN: [id number] Date of Birth: 02-Mar-1955

## 2017-01-06 NOTE — Telephone Encounter (Signed)
Pt called to say he is on his way @2 :20pm.

## 2017-01-08 ENCOUNTER — Ambulatory Visit (INDEPENDENT_AMBULATORY_CARE_PROVIDER_SITE_OTHER): Payer: BLUE CROSS/BLUE SHIELD | Admitting: Endocrinology

## 2017-01-08 ENCOUNTER — Ambulatory Visit: Payer: BLUE CROSS/BLUE SHIELD | Admitting: Family Medicine

## 2017-01-08 ENCOUNTER — Encounter: Payer: Self-pay | Admitting: Family Medicine

## 2017-01-08 ENCOUNTER — Telehealth (HOSPITAL_COMMUNITY): Payer: Self-pay | Admitting: Family Medicine

## 2017-01-08 ENCOUNTER — Ambulatory Visit (HOSPITAL_COMMUNITY): Payer: BLUE CROSS/BLUE SHIELD

## 2017-01-08 ENCOUNTER — Encounter: Payer: Self-pay | Admitting: Cardiovascular Disease

## 2017-01-08 ENCOUNTER — Other Ambulatory Visit: Payer: Self-pay | Admitting: Internal Medicine

## 2017-01-08 ENCOUNTER — Encounter: Payer: Self-pay | Admitting: Endocrinology

## 2017-01-08 ENCOUNTER — Other Ambulatory Visit: Payer: Self-pay

## 2017-01-08 ENCOUNTER — Ambulatory Visit: Payer: BLUE CROSS/BLUE SHIELD | Admitting: Endocrinology

## 2017-01-08 VITALS — BP 118/62 | HR 99 | Resp 16 | Ht 70.5 in | Wt 251.4 lb

## 2017-01-08 VITALS — BP 138/88 | HR 89 | Wt 251.4 lb

## 2017-01-08 DIAGNOSIS — M79662 Pain in left lower leg: Secondary | ICD-10-CM | POA: Diagnosis not present

## 2017-01-08 DIAGNOSIS — M79661 Pain in right lower leg: Secondary | ICD-10-CM | POA: Diagnosis not present

## 2017-01-08 DIAGNOSIS — R609 Edema, unspecified: Secondary | ICD-10-CM | POA: Diagnosis not present

## 2017-01-08 DIAGNOSIS — M792 Neuralgia and neuritis, unspecified: Secondary | ICD-10-CM

## 2017-01-08 DIAGNOSIS — B20 Human immunodeficiency virus [HIV] disease: Secondary | ICD-10-CM

## 2017-01-08 DIAGNOSIS — Z86718 Personal history of other venous thrombosis and embolism: Secondary | ICD-10-CM

## 2017-01-08 DIAGNOSIS — Z794 Long term (current) use of insulin: Secondary | ICD-10-CM | POA: Diagnosis not present

## 2017-01-08 DIAGNOSIS — E0849 Diabetes mellitus due to underlying condition with other diabetic neurological complication: Secondary | ICD-10-CM

## 2017-01-08 DIAGNOSIS — R252 Cramp and spasm: Secondary | ICD-10-CM | POA: Diagnosis not present

## 2017-01-08 LAB — POCT GLYCOSYLATED HEMOGLOBIN (HGB A1C): Hemoglobin A1C: 8.9

## 2017-01-08 MED ORDER — INSULIN GLARGINE 100 UNIT/ML SOLOSTAR PEN: 70.0000 [IU] | PEN_INJECTOR | SUBCUTANEOUS | 99 refills | Status: DC

## 2017-01-08 MED ORDER — INSULIN ASPART 100 UNIT/ML FLEXPEN: PEN_INJECTOR | SUBCUTANEOUS | 11 refills | Status: DC

## 2017-01-08 NOTE — Patient Instructions (Addendum)
  Keep follow up appointment with pulmonologist and cardiologist for shortness of breath and leg swelling.   I would still recommend discussing your medications with psychiatrist with your dizziness to see if any changes are recommend.   Increase fluid intake (not caffeinated) to help with dizziness.  No change in Lyrica dose for now- continue Lyrica at same dose for now.   I will check a muscle test, electrolytes and blood clot test for calf pain. It may be due to therapy or tight muscles. Try gentle stretches as we discussed few times per day.   Return to the clinic or go to the nearest emergency room if any of your symptoms worsen or new symptoms occur.    IF you received an x-ray today, you will receive an invoice from [facility]. Please contact [facility] at [phone] with questions or concerns regarding your invoice.   IF you received labwork today, you will receive an invoice from [facility]. Please contact [facility] at [phone] with questions or concerns regarding your invoice.   Our billing staff will not be able to assist you with questions regarding bills from these companies.  You will be contacted with the lab results as soon as they are available. The fastest way to get your results is to activate your My Chart account. Instructions are located on the last page of this paperwork. If you have not heard from [name] regarding the results in 2 weeks, please contact this office.

## 2017-01-08 NOTE — Progress Notes (Signed)
Subjective:  By signing my name below, I, [name], attest that this documentation has been prepared under the direction and in the presence of [name], MD Electronically Signed: Ladene Artist, ED Scribe 01/08/2017 at 5:58 PM.   Patient ID: Daniel [name], male    DOB: [birth year], 61 y.o.   MRN: [id number]  Chief Complaint  Patient presents with  . Follow-up    patient states that he is here for follow up, unsure of reason   HPI Daniel [name] is a 61 y.o. male who presents to Primary Care at [facility] for follow-up. See prior visit. H/o multiple medical problems.   Dizziness BP at the lower side of normal last visit. Thought to be multifactorial. Recommended increase fluid intake. Also discussed multi psychotropics and sedating meds. Recommended he discuss meds with psychiatrist to see if any changes possible.   Pt has not seen psychiatrist since last visit to discuss possible change in medications. States dizziness/light-headedness does not seem to bother him much. He has been trying to consume more water but states he has also been drinking more iced tea lately.  Peripheral Neuropathy We have tried increasing Lyrcia to 100 mg, some improvement last visit. Discussed neurology f/u if further changes needed. Did discuss potential Lyrica decrease to 75 mg during the day to minimize daytime sedation.   Pt has noticed improvement in neuropathy with increasing Lyrica. He has not noticed dizziness during the day or sedation.  Peripheral Edema H/o peripheral vascular disease with small vessel disease at his feet. Followed by podiatry for foot ulcers. Pt had a negative [name] for DVT on 11/2. Persistent dyspnea on exertion but CXR 11/1 no active cardio pulmonary disease. Normal pro-BNP on 10/18. H/o CKD but most recent creatinine stable at 1.4. Ranged from 1.34-1.67 over the past year. Referred to cardiology and pulmonology at last visit. Wt Readings from Last 3 Encounters:  01/08/17  251 lb 6.4 oz (114 kg)  01/08/17 251 lb 6.4 oz (114 kg)  12/25/16 250 lb (113.4 kg)   DM He has uncontrolled DM. Seen by endocrinologist today. Lantus reduced to 70 units in the morning. Meal time Novolog increased from 5, 5, 15 to 15, 15, 30.   Bilateral Calf Pain Pt has noticed bilateral calf pain, R worse than L, x 1-2 weeks. Pt describes pain as an aching sensation that is exacerbated with ambulating. Pt reports associated bilateral leg swelling. He does PT twice a week but has not noticed increased pain with this. Pt recently order compression stockings.   Patient Active Problem List   Diagnosis Date Noted  . Confusion 09/17/2016  . Chronic migraine 09/17/2016  . Gait abnormality 09/17/2016  . Diabetic foot infection ([place]) 06/28/2016  . Screening examination for venereal disease 02/11/2016  . Critical lower limb ischemia 12/07/2015  . Fall 12/05/2015  . Toe ulcer, right ([place]) 09/19/2015  . Decreased pedal pulses 09/19/2015  . OSA on CPAP 09/05/2015  . Major depressive disorder, recurrent episode, moderate ([place]) 09/05/2015  . OSA (obstructive sleep apnea) 07/25/2015  . Passed out [facility]) 06/28/2015  . Low back pain 06/11/2015  . Abnormality of gait 06/11/2015  . Dizziness 03/17/2015  . Weakness 02/21/2015  . Chronic renal insufficiency, stage III (moderate) ([place]) 08/09/2014  . Diabetes ([place]) 11/07/2013  . Hematuria 06/21/2013  . Hepatic steatosis 09/09/2010  . UTI 09/15/2006  . PYURIA 09/03/2006  . Human immunodeficiency virus (HIV) disease ([place]) 06/04/2006  . HERPES ZOSTER, UNCOMPLICATED 85/88/5027  . HSV 06/04/2006  .  Depression 06/04/2006  . DISORDER, ATTENTION DEFICIT W/HYPERACTIVITY 06/04/2006  . THROMBOPHLEBITIS NOS 06/04/2006  . GERD 06/04/2006  . ARTHRITIS, HAND 06/04/2006  . HYPERGLYCEMIA, HX OF 06/04/2006  . HEPATITIS B, HX OF 06/04/2006   Past Medical History:  Diagnosis Date  . ADHD (attention deficit hyperactivity disorder)   . Anxiety   . Chronic  kidney disease   . Clotting disorder ([place])   . Depression   . Diabetes mellitus without complication ([place])   . Diabetes mellitus, type II ([place])   . Dizziness 03/17/2015  . GERD (gastroesophageal reflux disease)   . HIV disease ([place])   . HIV infection ([place])   . Liver disease   . OSA (obstructive sleep apnea) 07/25/2015   Uses CPAP regularly  . Peripheral vascular disease ([place])   . Ulcer    Past Surgical History:  Procedure Laterality Date  . SMALL INTESTINE SURGERY    . STOMACH SURGERY    . TOE AMPUTATION Right 08/2016   right great toe   Allergies  Allergen Reactions  . Aspirin Swelling  . Ibuprofen Swelling  . Sustiva [Efavirenz] Swelling and Rash    And rash.  . Nsaids Other (See Comments)    unknwn   Prior to Admission medications   Medication Sig Start Date End Date Taking? Authorizing Provider  acetaminophen (TYLENOL) 500 MG tablet Take 1,000 mg by mouth every 8 (eight) hours as needed for moderate pain.     [provider]  ALPRAZolam Duanne Moron) 1 MG tablet Take 1 mg by mouth 3 (three) times daily as needed for anxiety.     [provider]  amphetamine-dextroamphetamine (ADDERALL) 30 MG tablet Take 30 mg by mouth 3 (three) times daily.     [provider]  atorvastatin (LIPITOR) 10 MG tablet TAKE ONE TABLET BY MOUTH DAILY. 12/26/16   [name], MD  diclofenac sodium (VOLTAREN) 1 % GEL APPLY 2 GRAMS TO EACH KNEE IN THE MORNING AND AT BEDTIME AND APPLY 1 GRAM TO EACH KNEE IN THE AFTERNOON. 11/26/16   [name], MD  divalproex (DEPAKOTE ER) 500 MG 24 hr tablet TAKE (1) TABLET BY MOUTH AT BEDTIME. 01/01/17   Marcial Pacas, MD  insulin aspart (NOVOLOG FLEXPEN) 100 UNIT/ML FlexPen 3 times a day (just before each meal) 15-15-30 units 01/08/17   Renato Shin, MD  Insulin Glargine (LANTUS SOLOSTAR) 100 UNIT/ML Solostar Pen Inject 70 Units every morning into the skin. 01/08/17   Renato Shin, MD  Insulin Pen Needle (PEN NEEDLES 3/16") 31G X 5 MM  MISC 1 Device by Other route 4 (four) times daily. Use to inject Lantus daily. 12/25/15   Renato Shin, MD  levothyroxine (SYNTHROID, LEVOTHROID) 50 MCG tablet Take 1 tablet (50 mcg total) by mouth at bedtime. 07/24/15   [name], MD  ondansetron (ZOFRAN-ODT) 4 MG disintegrating tablet Take 2 tablets (8 mg total) by mouth every 8 (eight) hours as needed for nausea or vomiting. 02/11/16   Comer, Okey Regal, MD  pregabalin (LYRICA) 100 MG capsule Take 1 capsule (100 mg total) by mouth 3 (three) times daily. 12/11/16   [name], MD  protriptyline (VIVACTIL) 10 MG tablet Take 10 mg by mouth 3 (three) times daily.  01/30/16   [provider]  ranitidine (ZANTAC) 150 MG tablet Take 150 mg by mouth daily.    [provider]  REXULTI 1 MG TABS  11/10/16   [provider]  SURE COMFORT PEN NEEDLES 31G X 8 MM  MISC USE TO INJECT LANTUS AND NOVOLOG DAILY. 12/22/16   Renato Shin, MD  TRINTELLIX 20 MG TABS Take 20 mg by mouth at bedtime. 01/26/15   [provider]  TRIUMEQ 600-50-300 MG tablet TAKE 1 TABLET BY MOUTH DAILY 04/02/16   Comer, Okey Regal, MD  UNABLE TO FIND CPAP MACHINE with standard Aclaim nasal mask with humidifier. Set at 14 cwp 04/12/13   Daub, Loura Back, MD  XARELTO 15 MG TABS tablet TAKE ONE TABLET BY MOUTH DAILY. 12/26/16   [name], MD  zolpidem (AMBIEN) 10 MG tablet Take 10-20 mg by mouth at bedtime as needed for sleep.     [provider]   Social History   Socioeconomic History  . Marital status: Single    Spouse name: Not on file  . Number of children: Not on file  . Years of education: Not on file  . Highest education level: Not on file  Social Needs  . Financial resource strain: Not on file  . Food insecurity - worry: Not on file  . Food insecurity - inability: Not on file  . Transportation needs - medical: Not on file  . Transportation needs - non-medical: Not on file  Occupational History  . Not on file  Tobacco  Use  . Smoking status: Former Smoker    Packs/day: 0.10    Years: 10.00    Pack years: 1.00    Types: Cigars, Cigarettes    Last attempt to quit: 08/09/2014    Years since quitting: 2.4  . Smokeless tobacco: Never Used  Substance and Sexual Activity  . Alcohol use: No    Alcohol/week: 0.0 oz  . Drug use: No  . Sexual activity: Not Currently    Partners: Male    Comment: pt. declined condoms  Other Topics Concern  . Not on file  Social History Narrative   Epworth Sleepiness Scale = 7 (as of 03/16/2015)   Review of Systems  Constitutional: Negative for fatigue and unexpected weight change.  Eyes: Negative for visual disturbance.  Respiratory: Negative for cough, chest tightness and shortness of breath.   Cardiovascular: Positive for leg swelling. Negative for chest pain and palpitations.  Gastrointestinal: Negative for abdominal pain and blood in stool.  Musculoskeletal: Positive for myalgias.  Neurological: Negative for dizziness, light-headedness and headaches.      Objective:   Physical Exam  Constitutional: He is oriented to person, place, and time. He appears well-developed and well-nourished.  HENT:  Head: Normocephalic and atraumatic.  Eyes: EOM are normal. Pupils are equal, round, and reactive to light.  Neck: No JVD present. Carotid bruit is not present.  Cardiovascular: Normal rate, regular rhythm and normal heart sounds.  No murmur heard. Pulmonary/Chest: Effort normal and breath sounds normal. He has no rales.  Musculoskeletal: He exhibits edema.  2+ pedal edema to the mid tibia. Negative Homans. Slight discomfort posterior calf diffusely, more so on the R than L.  Neurological: He is alert and oriented to person, place, and time.  Skin: Skin is warm and dry.  Psychiatric: He has a normal mood and affect.  Vitals reviewed.  Vitals:   01/08/17 1732  BP: 118/62  Pulse: 99  Resp: 16  SpO2: 97%  Weight: 251 lb 6.4 oz (114 kg)  Height: 5' 10.5" (1.791 m)       Assessment & Plan:   Daniel [name] is a 61 y.o. male Peripheral edema  - Overall stable, previous workup as above overall reassuring.  Recommended compressive stockings, be careful with diet and sodium, RTC precautions if worsening. Cardiology follow-up as well as pulmonary follow-up for dyspnea on exertion.  History of DVT (deep vein thrombosis) - Plan: D-dimer, quantitative (not at [facility]) Bilateral calf pain - Plan: CK, D-dimer, quantitative (not at [facility]) Muscle cramping - Plan: Basic metabolic panel, CK  -Bilateral pain, history of DVT but anticoagulated, unlikely DVT. Maybe in part due to his peripheral edema versus muscle cramping.  -Check d-dimer, CPK, but those are likely going to be normal. Calf stretches discussed, compressive stockings discussed. RTC precautions  Peripheral neuropathic pain  -Improved with higher dose of Lyrica, continue Lyrica 100 mg.   Still recommended he contact his psychiatrist to discuss his current medications as multiple sedating medicines as well as medication that may contribute to his dizziness. Stressed importance of hydration without caffeinated beverages as well.  Meds ordered this encounter  Medications  . Multiple Vitamins-Minerals (ONE-A-DAY MENS 50+ ADVANTAGE PO)    Sig: Take by mouth.   Patient Instructions    Keep follow up appointment with pulmonologist and cardiologist for shortness of breath and leg swelling.   I would still recommend discussing your medications with psychiatrist with your dizziness to see if any changes are recommend.   Increase fluid intake (not caffeinated) to help with dizziness.  No change in Lyrica dose for now- continue Lyrica at same dose for now.   I will check a muscle test, electrolytes and blood clot test for calf pain. It may be due to therapy or tight muscles. Try gentle stretches as we discussed few times per day.   Return to the clinic or go to the nearest emergency room if any of your symptoms  worsen or new symptoms occur.    IF you received an x-ray today, you will receive an invoice from [facility]. Please contact [facility] at [phone] with questions or concerns regarding your invoice.   IF you received labwork today, you will receive an invoice from [facility]. Please contact [facility] at [phone] with questions or concerns regarding your invoice.   Our billing staff will not be able to assist you with questions regarding bills from these companies.  You will be contacted with the lab results as soon as they are available. The fastest way to get your results is to activate your My Chart account. Instructions are located on the last page of this paperwork. If you have not heard from [name] regarding the results in 2 weeks, please contact this office.      I personally performed the services described in this documentation, which was scribed in my presence. The recorded information has been reviewed and considered for accuracy and completeness, addended by me as needed, and agree with information above.  Signed,   [name], MD [facility] at [facility].  01/10/17 12:52 PM

## 2017-01-08 NOTE — Telephone Encounter (Signed)
01/08/17  called to cx but no reason was given

## 2017-01-08 NOTE — Progress Notes (Signed)
Subjective:    Patient ID: Daniel [name], male    DOB: Jun 27, 1955, 61 y.o.   MRN: [id number]  HPI Pt returns for f/u of diabetes mellitus: DM type: Insulin-requiring type 2.  Dx'ed: 0932 Complications: polyneuropathy, PAD, renal insufficiency, and foot ulcers.  Therapy: insulin since 2016. DKA: never Severe hypoglycemia: never.  Pancreatitis: never.   Other: he takes multiple daily injections, but basal insulin is emphasized.   Interval history: no cbg record, but states cbg's vary from 100-400.  It is in general higher as the day goes on.  He says he never misses the insulin.  pt states he feels well in general. Past Medical History:  Diagnosis Date  . ADHD (attention deficit hyperactivity disorder)   . Anxiety   . Chronic kidney disease   . Clotting disorder ([place])   . Depression   . Diabetes mellitus without complication ([place])   . Diabetes mellitus, type II ([place])   . Dizziness 03/17/2015  . GERD (gastroesophageal reflux disease)   . HIV disease ([place])   . HIV infection ([place])   . Liver disease   . OSA (obstructive sleep apnea) 07/25/2015   Uses CPAP regularly  . Peripheral vascular disease ([place])   . Ulcer     Past Surgical History:  Procedure Laterality Date  . SMALL INTESTINE SURGERY    . STOMACH SURGERY    . TOE AMPUTATION Right 08/2016   right great toe    Social History   Socioeconomic History  . Marital status: Single    Spouse name: Not on file  . Number of children: Not on file  . Years of education: Not on file  . Highest education level: Not on file  Social Needs  . Financial resource strain: Not on file  . Food insecurity - worry: Not on file  . Food insecurity - inability: Not on file  . Transportation needs - medical: Not on file  . Transportation needs - non-medical: Not on file  Occupational History  . Not on file  Tobacco Use  . Smoking status: Former Smoker    Packs/day: 0.10    Years: 10.00    Pack years: 1.00    Types: Cigars,  Cigarettes    Last attempt to quit: 08/09/2014    Years since quitting: 2.4  . Smokeless tobacco: Never Used  Substance and Sexual Activity  . Alcohol use: No    Alcohol/week: 0.0 oz  . Drug use: No  . Sexual activity: Not Currently    Partners: Male    Comment: pt. declined condoms  Other Topics Concern  . Not on file  Social History Narrative   Epworth Sleepiness Scale = 7 (as of 03/16/2015)    Current Outpatient Medications on File Prior to Visit  Medication Sig Dispense Refill  . acetaminophen (TYLENOL) 500 MG tablet Take 1,000 mg by mouth every 8 (eight) hours as needed for moderate pain.     [name] ALPRAZolam (XANAX) 1 MG tablet Take 1 mg by mouth 3 (three) times daily as needed for anxiety.     [name] amphetamine-dextroamphetamine (ADDERALL) 30 MG tablet Take 30 mg by mouth 3 (three) times daily.     [name] atorvastatin (LIPITOR) 10 MG tablet TAKE ONE TABLET BY MOUTH DAILY. 90 tablet 0  . diclofenac sodium (VOLTAREN) 1 % GEL APPLY 2 GRAMS TO EACH KNEE IN THE MORNING AND AT BEDTIME AND APPLY 1 GRAM TO EACH KNEE IN THE AFTERNOON. 300 g 0  . divalproex (DEPAKOTE ER)  500 MG 24 hr tablet TAKE (1) TABLET BY MOUTH AT BEDTIME. 90 tablet 2  . Insulin Pen Needle (PEN NEEDLES 3/16") 31G X 5 MM MISC 1 Device by Other route 4 (four) times daily. Use to inject Lantus daily. 120 each 11  . levothyroxine (SYNTHROID, LEVOTHROID) 50 MCG tablet Take 1 tablet (50 mcg total) by mouth at bedtime. 30 tablet 11  . ondansetron (ZOFRAN-ODT) 4 MG disintegrating tablet Take 2 tablets (8 mg total) by mouth every 8 (eight) hours as needed for nausea or vomiting. 60 tablet 5  . pregabalin (LYRICA) 100 MG capsule Take 1 capsule (100 mg total) by mouth 3 (three) times daily. 90 capsule 3  . protriptyline (VIVACTIL) 10 MG tablet Take 10 mg by mouth 3 (three) times daily.   11  . ranitidine (ZANTAC) 150 MG tablet Take 150 mg by mouth daily.    [name] REXULTI 1 MG TABS   98  . SURE COMFORT PEN NEEDLES 31G X 8 MM MISC USE TO INJECT  LANTUS AND NOVOLOG DAILY. 100 each 0  . TRINTELLIX 20 MG TABS Take 20 mg by mouth at bedtime.    [name] UNABLE TO FIND CPAP MACHINE with standard Aclaim nasal mask with humidifier. Set at 14 cwp (Patient taking differently: CPAP MACHINE with standard Aclaim nasal mask with humidifier. Set at 4 cwp) 1 each 0  . XARELTO 15 MG TABS tablet TAKE ONE TABLET BY MOUTH DAILY. 30 tablet 0  . zolpidem (AMBIEN) 10 MG tablet Take 10-15 mg at bedtime as needed by mouth for sleep.     . Multiple Vitamins-Minerals (ONE-A-DAY MENS 50+ ADVANTAGE PO) Take by mouth.     No current facility-administered medications on file prior to visit.     Allergies  Allergen Reactions  . Aspirin Swelling  . Ibuprofen Swelling  . Sustiva [Efavirenz] Swelling and Rash    And rash.  . Nsaids Other (See Comments)    unknwn    Family History  Problem Relation Age of Onset  . Cancer Brother   . Depression Brother   . COPD Mother   . Diabetes Neg Hx     BP 138/88 (BP Location: Right Arm, Patient Position: Sitting, Cuff Size: Normal)   Pulse 89   Wt 251 lb 6.4 oz (114 kg)   SpO2 97%   BMI 35.56 kg/m    Review of Systems He denies hypoglycemia    Objective:   Physical Exam VITAL SIGNS:  See vs page.  GENERAL: no distress. Pulses: foot pulses are intact bilaterally.   MSK: no deformity of the feet or ankles, except the right great toe is absent.  CV: 1+ bilat edema of the legs  Skin: Right foot ulcer is healed.  normal color and temp on the feet and ankles Neuro: sensation is intact to touch on the feet and ankles, but decreased from normal. There is bilateral onychomycosis of the toenails.    a1-----    Assessment & Plan:  Insulin-requiring type 2 DM, with PAD: worse Renal insuff: he needs more mealtime insulin, and less basal.  Patient Instructions  check your blood sugar twice a day.  vary the time of day when you check, between before the 3 meals, and at bedtime.  also check if you have symptoms of your  blood sugar being too high or too low.  please keep a record of the readings and bring it to your next appointment here (or you can bring the meter itself).  You can  write it on any piece of paper.  please call us sooner if your blood sugar goes below 70, or if you have a lot of readings over 200.   For now, please:  reduce the lantus to 70 units each morning, and:  increase the novolog to 3 times a day (just before each meal) 15-15-30 units.  Please come back for a follow-up appointment in 2 months.

## 2017-01-08 NOTE — Patient Instructions (Addendum)
check your blood sugar twice a day.  vary the time of day when you check, between before the 3 meals, and at bedtime.  also check if you have symptoms of your blood sugar being too high or too low.  please keep a record of the readings and bring it to your next appointment here (or you can bring the meter itself).  You can write it on any piece of paper.  please call us sooner if your blood sugar goes below 70, or if you have a lot of readings over 200.   For now, please:  reduce the lantus to 70 units each morning, and:  increase the novolog to 3 times a day (just before each meal) 15-15-30 units.  Please come back for a follow-up appointment in 2 months.

## 2017-01-09 LAB — BASIC METABOLIC PANEL
BUN / CREAT RATIO: 14 (ref 10–24)
BUN: 18 mg/dL (ref 8–27)
CALCIUM: 9.2 mg/dL (ref 8.6–10.2)
CHLORIDE: 100 mmol/L (ref 96–106)
CO2: 22 mmol/L (ref 20–29)
Creatinine, Ser: 1.3 mg/dL — ABNORMAL HIGH (ref 0.76–1.27)
GFR calc Af Amer: 67 mL/min/{1.73_m2} (ref >59)
GFR calc non Af Amer: 58 mL/min/{1.73_m2} — ABNORMAL LOW (ref >59)
GLUCOSE: 372 mg/dL — AB (ref 65–99)
Potassium: 4.3 mmol/L (ref 3.5–5.2)
Sodium: 137 mmol/L (ref 134–144)

## 2017-01-09 LAB — D-DIMER, QUANTITATIVE (NOT AT ARMC): D-DIMER: 1.1 mg{FEU}/L — AB (ref 0.00–0.49)

## 2017-01-09 LAB — CK: CK TOTAL: 42 U/L (ref 24–204)

## 2017-01-10 ENCOUNTER — Other Ambulatory Visit: Payer: Self-pay | Admitting: Family Medicine

## 2017-01-10 DIAGNOSIS — M79661 Pain in right lower leg: Secondary | ICD-10-CM

## 2017-01-10 DIAGNOSIS — M79662 Pain in left lower leg: Principal | ICD-10-CM

## 2017-01-10 DIAGNOSIS — R7989 Other specified abnormal findings of blood chemistry: Secondary | ICD-10-CM

## 2017-01-10 NOTE — Progress Notes (Unsigned)
Elevated ddimer, bilateral calf pain. On anticoagulation, but new calf pain.

## 2017-01-13 ENCOUNTER — Telehealth (HOSPITAL_COMMUNITY): Payer: Self-pay | Admitting: Family Medicine

## 2017-01-13 ENCOUNTER — Ambulatory Visit: Payer: BLUE CROSS/BLUE SHIELD | Admitting: Internal Medicine

## 2017-01-13 ENCOUNTER — Ambulatory Visit (HOSPITAL_COMMUNITY): Payer: BLUE CROSS/BLUE SHIELD

## 2017-01-13 ENCOUNTER — Encounter: Payer: Self-pay | Admitting: Internal Medicine

## 2017-01-13 VITALS — BP 126/74 | HR 93 | Ht 70.0 in | Wt 252.0 lb

## 2017-01-13 DIAGNOSIS — R0609 Other forms of dyspnea: Secondary | ICD-10-CM

## 2017-01-13 DIAGNOSIS — R0600 Dyspnea, unspecified: Secondary | ICD-10-CM | POA: Insufficient documentation

## 2017-01-13 NOTE — Progress Notes (Signed)
Subjective:     Patient ID: Daniel [name], male   DOB: [birth year],   MRN: [id number]  HPI  70 yowm cigar smoker quit 07/2014 because they got expensive with wt then around 180 and gained wt while on Prednisone (can't remember why needed it) with obesity complicated by remote DVT/PE  referred to pulmonary clinic 01/13/2017 by Dr   [name] for sob on maint xarelto    01/13/2017 1st [facility] office visit/ Donta Mcinroy   Chief Complaint  Patient presents with  . Pulmonary Consult    Referred by Dr. Carlota Raspberry. Pt c/o DOE for the past 2-3 yrs. He gets winded when carrying in the groceries and when he walks up stairs.   doe = MMRC3 = can't walk 100 yards even at a slow pace at a flat grade s stopping due to sob  eg can't do [employer] n[employer] size store s stopping due to sob and back pain. cpap / Lorrin Mais >> no resp symptoms noct or am flares of cough/ congestion  On prn zantac s/p remote NF   No obvious day to day or daytime variability or assoc excess/ purulent sputum or mucus plugs or hemoptysis or cp or chest tightness, subjective wheeze or overt sinus or hb symptoms. No unusual exp hx or h/o childhood pna/ asthma or knowledge of premature birth.  Sleeping ok flat on cpap  without nocturnal  or early am exacerbation  of respiratory  c/o's or need for noct saba. Also denies any obvious fluctuation of symptoms with weather or environmental changes or other aggravating or alleviating factors except as outlined above   Current Allergies, Complete Past Medical History, Past Surgical History, Family History, and Social History were reviewed in [employer] record.  ROS  The following are not active complaints unless bolded Hoarseness, sore throat, dysphagia, dental problems, itching, sneezing,  nasal congestion or discharge of excess mucus or purulent secretions, ear ache,   fever, chills, sweats, unintended wt loss or wt gain, classically pleuritic or exertional cp,  orthopnea pnd or leg  swelling R > L  , presyncope, palpitations, abdominal pain, anorexia, nausea, vomiting, diarrhea  or change in bowel habits or change in bladder habits, change in stools or change in urine, dysuria, hematuria,  rash, arthralgias/ chronic back pain, visual complaints, headache, numbness, weakness or ataxia or problems with walking or coordination,  change in mood/affect or memory.        Current Meds  Medication Sig  . acetaminophen (TYLENOL) 500 MG tablet Take 1,000 mg by mouth every 8 (eight) hours as needed for moderate pain.   [name] ALPRAZolam (XANAX) 1 MG tablet Take 1 mg by mouth 3 (three) times daily as needed for anxiety.   [name] amphetamine-dextroamphetamine (ADDERALL) 30 MG tablet Take 30 mg by mouth 3 (three) times daily.   [name] atorvastatin (LIPITOR) 10 MG tablet TAKE ONE TABLET BY MOUTH DAILY.  [name] diclofenac sodium (VOLTAREN) 1 % GEL APPLY 2 GRAMS TO EACH KNEE IN THE MORNING AND AT BEDTIME AND APPLY 1 GRAM TO EACH KNEE IN THE AFTERNOON.  [name] divalproex (DEPAKOTE ER) 500 MG 24 hr tablet TAKE (1) TABLET BY MOUTH AT BEDTIME.  . insulin aspart (NOVOLOG FLEXPEN) 100 UNIT/ML FlexPen 3 times a day (just before each meal) 15-15-30 units  . Insulin Glargine (LANTUS SOLOSTAR) 100 UNIT/ML Solostar Pen Inject 70 Units every morning into the skin.  [name] levothyroxine (SYNTHROID, LEVOTHROID) 50 MCG tablet Take 1 tablet (50 mcg total) by mouth at bedtime.  [name]  Multiple Vitamins-Minerals (ONE-A-DAY MENS 50+ ADVANTAGE PO) Take by mouth.  . ondansetron (ZOFRAN-ODT) 4 MG disintegrating tablet Take 2 tablets (8 mg total) by mouth every 8 (eight) hours as needed for nausea or vomiting.  . pregabalin (LYRICA) 100 MG capsule Take 1 capsule (100 mg total) by mouth 3 (three) times daily.  . protriptyline (VIVACTIL) 10 MG tablet Take 10 mg by mouth 3 (three) times daily.   . ranitidine (ZANTAC) 150 MG tablet Take 150 mg by mouth daily.  [name] REXULTI 1 MG TABS   . SURE COMFORT PEN NEEDLES 31G X 8 MM MISC USE TO INJECT LANTUS AND  NOVOLOG DAILY.  [name] TRINTELLIX 20 MG TABS Take 20 mg by mouth at bedtime.  . TRIUMEQ 600-50-300 MG tablet TAKE 1 TABLET BY MOUTH DAILY  . UNABLE TO FIND CPAP MACHINE with standard Aclaim nasal mask with humidifier. Set at 14 cwp (Patient taking differently: CPAP MACHINE with standard Aclaim nasal mask with humidifier. Set at 4 cwp)  . XARELTO 15 MG TABS tablet TAKE ONE TABLET BY MOUTH DAILY.  [name] zolpidem (AMBIEN) 10 MG tablet Take 10-15 mg at bedtime as needed by mouth for sleep.   . [DISCONTINUED] Insulin Pen Needle (PEN NEEDLES 3/16") 31G X 5 MM MISC 1 Device by Other route 4 (four) times daily. Use to inject Lantus daily.              Review of Systems     Objective:   Physical Exam  amb obese wm with helpless / hopelss affect / very vague on details of care.      Wt Readings from Last 3 Encounters:  01/13/17 252 lb (114.3 kg)  01/08/17 251 lb 6.4 oz (114 kg)  01/08/17 251 lb 6.4 oz (114 kg)    Vital signs reviewed  - Note on arrival 02 sats  95% on RA      HEENT: nl dentition, turbinates bilaterally, and oropharynx. Nl external ear canals without cough reflex Modified Mallampati Score =   3/4   NECK :  without JVD/Nodes/TM/ nl carotid upstrokes bilaterally   LUNGS: no acc muscle use,  Nl contour chest which is clear to A and P bilaterally without cough on insp or exp maneuvers   CV:  RRR  no s3 or murmur or increase in P2, and  Trace pitting R > L lower ext ABD:  soft and nontender with nl inspiratory excursion in the supine position. No bruits or organomegaly appreciated, bowel sounds nl  MS:  Nl gait/ ext warm without deformities, calf tenderness, cyanosis or clubbing No obvious joint restrictions   SKIN: warm and dry with min venous stasis changes R > L leg  NEURO:  alert, approp, nl sensorium with  no motor or cerebellar deficits apparent.       I personally reviewed images and agree with radiology impression as follows:  CXR:    12/25/16 No active  cardiopulmonary disease.    Labs  reviewed:      Chemistry      Component Value Date/Time   NA 137 01/08/2017 1839   K 4.3 01/08/2017 1839   CL 100 01/08/2017 1839   CO2 22 01/08/2017 1839   BUN 18 01/08/2017 1839   CREATININE 1.30 (H) 01/08/2017 1839   CREATININE 1.45 (H) 11/12/2015 1227      Component Value Date/Time   CALCIUM 9.2 01/08/2017 1839   ALKPHOS 139 (H) 12/11/2016 1853   AST 18 12/11/2016 1853   ALT 20 12/11/2016  1853   BILITOT 0.7 12/11/2016 1853        Lab Results  Component Value Date   WBC 8.2 12/25/2016   HGB 15.2 12/25/2016   HCT 44.3 12/25/2016   MCV 93 12/25/2016   PLT 152 12/25/2016     Lab Results  Component Value Date   DDIMER 1.10 (H) 01/08/2017      Lab Results  Component Value Date   TSH 0.736 09/17/2016     Lab Results  Component Value Date   PROBNP 31 12/11/2016           Assessment:

## 2017-01-13 NOTE — Telephone Encounter (Signed)
01/13/17  pt cx said he wouldn't be able to make it today

## 2017-01-13 NOTE — Patient Instructions (Addendum)
Weight control is simply a matter of calorie balance which needs to be tilted in your favor by eating less and exercising more.  To get the most out of exercise, you need to be continuously aware that you are short of breath, but never out of breath, for 30 minutes daily. As you improve, it will actually be easier for you to do the same amount of exercise  in  30 minutes so always push to the level where you are short of breath.  If this does not result in gradual weight reduction then I strongly recommend you see a nutritionist with a food diary x 2 weeks so that we can work out a negative calorie balance which is universally effective in steady weight loss programs.  Think of your calorie balance like you do your bank account where in this case you want the balance to go down so you must take in less calories than you burn up.  It's just that simple:  Hard to do, but easy to understand.  Good luck!    Pulmonary follow up is as needed for new cough or new breathing pattern

## 2017-01-13 NOTE — Progress Notes (Signed)
Would you like [name] to advise pt of this?

## 2017-01-13 NOTE — Progress Notes (Unsigned)
See lab notes. I ordered ultrasound, but asked he be called with plan. It appears Davina left him a voicemail yesterday. We can try to reach him again and get the ultrasound scheduled if possible. Thanks.

## 2017-01-14 ENCOUNTER — Ambulatory Visit: Payer: BLUE CROSS/BLUE SHIELD | Admitting: Cardiovascular Disease

## 2017-01-15 ENCOUNTER — Encounter: Payer: Self-pay | Admitting: Internal Medicine

## 2017-01-15 NOTE — Assessment & Plan Note (Signed)
Body mass index is 36.16 kg/m.  -  trending up slightly  Lab Results  Component Value Date   TSH 0.736 09/17/2016     Contributing to gerd risk/ doe/reviewed the need and the process to achieve and maintain neg calorie balance > defer f/u primary care including intermittently monitoring thyroid status

## 2017-01-15 NOTE — Assessment & Plan Note (Addendum)
Spirometry 01/13/2017  FEV1 2.62 (75%)  Ratio 84 min curvature no rx prio - 01/13/2017  Walked RA x 3 laps @ 185 ft each stopped due to  End of study, fast pace, no desat    - mild sob / back pain   Symptoms are  disproportionate to objective findings and not clear this is actually much of a  lung problem but pt does a ppear to have difficult to sort out respiratory symptoms of unknown origin for which  DDX  = almost all start with A and  include Adherence, Ace Inhibitors, Acid Reflux, Active Sinus Disease, Alpha 1 Antitripsin deficiency, Anxiety masquerading as Airways dz,  ABPA,  Allergy(esp in young), Aspiration (esp in elderly), Adverse effects of meds,  Active smokers, A bunch of PE's (a small clot burden can't cause this syndrome unless there is already severe underlying pulm or vascular dz with poor reserve) Anemia / thyroid dz,  plus two Bs  = Bronchiectasis and Beta blocker use..and one C= CHF  Adherence is always the initial "prime suspect" and is a multilayered concern that requires a "trust but verify" approach in every patient - starting with knowing how to use medications, especially inhalers, correctly, keeping up with refills and understanding the fundamental difference between maintenance and prns vs those medications only taken for a very short course and then stopped and not refilled.   ? Anxiety/depression/deconditiong  > usually at the bottom of this list of usual suspects but should be much higher on this pt's based on H and P and note pt on multiple psychotropic/ cns active meds > f/u PCP planned   ? Allergy/ asthma > very unlikely s variability or noct symptoms or assoc rhinitis  ? A bunch of PE's > very unlikely on xarelto - he tells me he developed a clot while on it but I could find no evidence for this in the records - if true it would place him at risk of dvt/pe which can occur in the very obese and argue for using coumadin in the future but I doubt active clotting now based  on h and p and ex test today and note w/u for d dimer already ordered by Dr Carlota Raspberry and will likely be neg as well  Anemia/ thyroid dz > ruled out by lab review  ? chf >  Ruled out with bnp so low    rec reconditioning/ wt loss / pulmonary f/u prn    Total time devoted to counseling  > 50 % of initial 60 min office visit:  review case with pt/ discussion of options/alternatives/ personally creating written customized instructions  in presence of pt  then going over those specific  Instructions directly with the pt including how to use all of the meds but in particular covering each new medication in detail and the difference between the maintenance= "automatic" meds and the prns using an action plan format for the latter (If this problem/symptom => do that organization reading Left to right).  Please see AVS from this visit for a full list of these instructions which I personally wrote for this pt and  are unique to this visit.

## 2017-01-20 ENCOUNTER — Encounter (HOSPITAL_COMMUNITY): Payer: Self-pay

## 2017-01-20 ENCOUNTER — Encounter: Payer: Self-pay | Admitting: Cardiovascular Disease

## 2017-01-20 ENCOUNTER — Ambulatory Visit (INDEPENDENT_AMBULATORY_CARE_PROVIDER_SITE_OTHER): Payer: BLUE CROSS/BLUE SHIELD | Admitting: Sports Medicine

## 2017-01-20 ENCOUNTER — Telehealth (HOSPITAL_COMMUNITY): Payer: Self-pay

## 2017-01-20 ENCOUNTER — Encounter: Payer: Self-pay | Admitting: Sports Medicine

## 2017-01-20 ENCOUNTER — Ambulatory Visit: Payer: BLUE CROSS/BLUE SHIELD | Admitting: Cardiovascular Disease

## 2017-01-20 ENCOUNTER — Ambulatory Visit (HOSPITAL_COMMUNITY): Payer: BLUE CROSS/BLUE SHIELD

## 2017-01-20 DIAGNOSIS — R0600 Dyspnea, unspecified: Secondary | ICD-10-CM

## 2017-01-20 DIAGNOSIS — I998 Other disorder of circulatory system: Secondary | ICD-10-CM | POA: Diagnosis not present

## 2017-01-20 DIAGNOSIS — I739 Peripheral vascular disease, unspecified: Secondary | ICD-10-CM | POA: Diagnosis not present

## 2017-01-20 DIAGNOSIS — M6281 Muscle weakness (generalized): Secondary | ICD-10-CM

## 2017-01-20 DIAGNOSIS — E1149 Type 2 diabetes mellitus with other diabetic neurological complication: Secondary | ICD-10-CM | POA: Diagnosis not present

## 2017-01-20 DIAGNOSIS — B351 Tinea unguium: Secondary | ICD-10-CM

## 2017-01-20 DIAGNOSIS — L97511 Non-pressure chronic ulcer of other part of right foot limited to breakdown of skin: Secondary | ICD-10-CM | POA: Diagnosis not present

## 2017-01-20 DIAGNOSIS — R2681 Unsteadiness on feet: Secondary | ICD-10-CM

## 2017-01-20 DIAGNOSIS — E1140 Type 2 diabetes mellitus with diabetic neuropathy, unspecified: Secondary | ICD-10-CM

## 2017-01-20 DIAGNOSIS — R0609 Other forms of dyspnea: Secondary | ICD-10-CM

## 2017-01-20 DIAGNOSIS — R29898 Other symptoms and signs involving the musculoskeletal system: Secondary | ICD-10-CM

## 2017-01-20 DIAGNOSIS — I70229 Atherosclerosis of native arteries of extremities with rest pain, unspecified extremity: Secondary | ICD-10-CM

## 2017-01-20 MED ORDER — TERBINAFINE HCL 250 MG PO TABS: ORAL_TABLET | ORAL | 0 refills | Status: DC

## 2017-01-20 NOTE — Assessment & Plan Note (Signed)
I saw Daniel [name] year ago for critical limb ischemia. His Doppler showedtriphasic waveforms in both legs. He underwent right great toe amputation at [facility] in May of this year which has healed nicely.

## 2017-01-20 NOTE — Progress Notes (Signed)
Subjective: Daniel [name] is a 61 y.o. male patient seen today in office for follow up evaluation of right foot, s/p Right hallux amputation done at [facility] and ulcer to 2nd toe, reports that it has healed and wants to discuss options for nail fungus. Patient denies constitional symptoms. Patient denies any other issues.   FBS not recorded, high per patient even though insulin has been adjusted  Patient Active Problem List   Diagnosis Date Noted  . Morbid obesity due to excess calories ([place]) complicated by DM / hyperlipidemia 01/15/2017  . DOE (dyspnea on exertion) 01/13/2017  . Confusion 09/17/2016  . Chronic migraine 09/17/2016  . Gait abnormality 09/17/2016  . Diabetic foot infection ([place]) 06/28/2016  . Screening examination for venereal disease 02/11/2016  . Critical lower limb ischemia 12/07/2015  . Fall 12/05/2015  . Toe ulcer, right ([place]) 09/19/2015  . Decreased pedal pulses 09/19/2015  . OSA on CPAP 09/05/2015  . Major depressive disorder, recurrent episode, moderate ([place]) 09/05/2015  . OSA (obstructive sleep apnea) 07/25/2015  . Passed out [facility]) 06/28/2015  . Low back pain 06/11/2015  . Abnormality of gait 06/11/2015  . Dizziness 03/17/2015  . Weakness 02/21/2015  . Chronic renal insufficiency, stage III (moderate) ([place]) 08/09/2014  . Diabetes ([place]) 11/07/2013  . Hematuria 06/21/2013  . Hepatic steatosis 09/09/2010  . UTI 09/15/2006  . PYURIA 09/03/2006  . Human immunodeficiency virus (HIV) disease ([place]) 06/04/2006  . HERPES ZOSTER, UNCOMPLICATED 56/43/3295  . HSV 06/04/2006  . Depression 06/04/2006  . DISORDER, ATTENTION DEFICIT W/HYPERACTIVITY 06/04/2006  . THROMBOPHLEBITIS NOS 06/04/2006  . GERD 06/04/2006  . ARTHRITIS, HAND 06/04/2006  . HYPERGLYCEMIA, HX OF 06/04/2006  . HEPATITIS B, HX OF 06/04/2006    Current Outpatient Medications on File Prior to Visit  Medication Sig Dispense Refill  . acetaminophen (TYLENOL) 500 MG tablet Take 1,000 mg by mouth  every 8 (eight) hours as needed for moderate pain.     [name] ALPRAZolam (XANAX) 1 MG tablet Take 1 mg by mouth 3 (three) times daily as needed for anxiety.     [name] amphetamine-dextroamphetamine (ADDERALL) 30 MG tablet Take 30 mg by mouth 3 (three) times daily.     [name] atorvastatin (LIPITOR) 10 MG tablet TAKE ONE TABLET BY MOUTH DAILY. 90 tablet 0  . diclofenac sodium (VOLTAREN) 1 % GEL APPLY 2 GRAMS TO EACH KNEE IN THE MORNING AND AT BEDTIME AND APPLY 1 GRAM TO EACH KNEE IN THE AFTERNOON. 300 g 0  . divalproex (DEPAKOTE ER) 500 MG 24 hr tablet TAKE (1) TABLET BY MOUTH AT BEDTIME. 90 tablet 2  . insulin aspart (NOVOLOG FLEXPEN) 100 UNIT/ML FlexPen 3 times a day (just before each meal) 15-15-30 units 30 mL 11  . Insulin Glargine (LANTUS SOLOSTAR) 100 UNIT/ML Solostar Pen Inject 70 Units every morning into the skin. 10 pen PRN  . levothyroxine (SYNTHROID, LEVOTHROID) 50 MCG tablet Take 1 tablet (50 mcg total) by mouth at bedtime. 30 tablet 11  . Multiple Vitamins-Minerals (ONE-A-DAY MENS 50+ ADVANTAGE PO) Take by mouth.    . ondansetron (ZOFRAN-ODT) 4 MG disintegrating tablet Take 2 tablets (8 mg total) by mouth every 8 (eight) hours as needed for nausea or vomiting. 60 tablet 5  . pregabalin (LYRICA) 100 MG capsule Take 1 capsule (100 mg total) by mouth 3 (three) times daily. 90 capsule 3  . protriptyline (VIVACTIL) 10 MG tablet Take 10 mg by mouth 3 (three) times daily.   11  . ranitidine (ZANTAC) 150 MG tablet  Take 150 mg by mouth daily as needed.     [name] REXULTI 1 MG TABS   98  . SURE COMFORT PEN NEEDLES 31G X 8 MM MISC USE TO INJECT LANTUS AND NOVOLOG DAILY. 100 each 0  . TRINTELLIX 20 MG TABS Take 20 mg by mouth at bedtime.    . TRIUMEQ 600-50-300 MG tablet TAKE 1 TABLET BY MOUTH DAILY 30 tablet 6  . UNABLE TO FIND CPAP MACHINE with standard Aclaim nasal mask with humidifier. Set at 14 cwp (Patient taking differently: CPAP MACHINE with standard Aclaim nasal mask with humidifier. Set at 4 cwp) 1 each 0  .  XARELTO 15 MG TABS tablet TAKE ONE TABLET BY MOUTH DAILY. 30 tablet 0  . zolpidem (AMBIEN) 10 MG tablet Take 10-15 mg at bedtime as needed by mouth for sleep.      No current facility-administered medications on file prior to visit.     Allergies  Allergen Reactions  . Aspirin Swelling  . Ibuprofen Swelling  . Sustiva [Efavirenz] Swelling and Rash    And rash.  . Nsaids Other (See Comments)    unknwn    Objective: There were no vitals filed for this visit.  General: No acute distress, AAOx3   Right foot: Incision well healed at surgical hallux amputation site, moderate swelling to right forefoot, blanchable erythema, decreased warmth, no drainage, There a healed ulceration at distal 2nd toe on right with minimal dry keratotic skin no opening no other signs of infection noted, nails 3-5 on right are thick yellow and mycotic with no signs of infection, Capillary fill time <5 seconds in all remaining toes on right, protective sensation diminished on right, No pain or crepitation with range of motion right foot. No pain with calf compression.   Left foot: Pre-ulcerative callus left hallux and left 2nd toe with minimal keratosis and no signs of infection. Dry skin. Nails 2-5 on left are thick yellow and mycotic with no signs of infection, No other acute findings.  Pes planus foot type bilateral.  Fungal culture + T Rubrum   Assessment and Plan:  Problem List Items Addressed This Visit    None    Visit Diagnoses    Dermatophytosis of nail    -  Primary   Relevant Medications   terbinafine (LAMISIL) 250 MG tablet   Right second toe ulcer, limited to breakdown of skin ([place])       Healed   Diabetic neuropathy with neurologic complication ([place])       PAD (peripheral artery disease) ([place])         -Patient seen and evaluated -Fungal culture results discussed patient desires to try PO Lamisil; Rx lamisil pulse dosing once weekly per month for 6 months; discussed risks with patient who  elect to try lamisil -Advised patient to continue with diabetic shoes and insoles  -Advised patient to limit activity to necessity  -Advised patient to elevate and continue with compression socks for edema control   -Will plan for med check at next office visit. In the meantime, patient to call office if any issues or problems arise.   Landis Martins, DPM

## 2017-01-20 NOTE — Assessment & Plan Note (Signed)
Mr. Isensee was referred back by Dr. Carlota Raspberry for dyspnea. This has not changed in severity. He did have a 2-D echo performed 04/05/15 that showed normal LV systolic function without valvular abnormalities. A Myoview stress test likewise was nonischemic and low risk.he also sees Dr. Melvyn Novas for pulmonary evaluation. I do not think that his dyspnea is cardiovascularly mediated.Daniel [name]

## 2017-01-20 NOTE — Therapy (Signed)
[place], [place], [address] Phone: [phone]   Fax:  [phone]  Patient Details  Name: Daniel [name] MRN: [id number] Date of Birth: 12/25/1955 Referring Provider:  [name], MD  Encounter Date: 01/20/2017   Patient arrived late today, and reports that his MD has cleared his R LE of DVT, however has recommended that they perform further testing on his L LE for possible DVT. Patient states "I'd like to do something today, I'm sure I don't have a DVT" however DPT present to provide education about risks/benefits of exercising with possible DVT, including PE and possible death if an untreated clot were to be present.   Patient agreeable to holding on PT sessions until he receives further testing for possible DVT, however states "I have an appointment at the [facility] tomorrow and I still plan on going" despite PT education to hold on this until testing is clear/negative for DVT in L LE.   Plan to hold PT session until all testing for possible DVT returns as negative moving forward. No charge for today's session.    Deniece Ree PT, DPT, CBIS  Supplemental Physical Therapist [facility] [address] [place], [place], [address] Phone: [phone]   Fax:  [phone]

## 2017-01-20 NOTE — Patient Instructions (Signed)
Medication Instructions: Your physician recommends that you continue on your current medications as directed. Please refer to the Current Medication list given to you today.   Follow-Up: Your physician recommends that you schedule a follow-up appointment as needed with Dr. Berry.    

## 2017-01-20 NOTE — Telephone Encounter (Signed)
He is waiting to find out if he has a blood clot and will call us back to let us know about future apptments

## 2017-01-20 NOTE — Progress Notes (Signed)
01/20/2017 Daniel [name] [facility]   Sep 29, 1955  [id number]  Primary Physician [name], MD Primary Cardiologist: [name] MD [name], [place], [place]  HPI:  Daniel [name] is a 61 y.o.  mildly overweight single Caucasian male who children who is currently disabled. His primary care provider is Dr. [name]. He was referred by [name], his podiatrist for peripheral vascular evaluation because of critical limb ischemia. He has a history of diabetes with diabetic peripheral neuropathy as well as hyperlipidemia. He has never had a heart attack but has had a small stroke in his pons. He apparently injured his right foot several months ago and has had nonhealing ulcers. He's had weekly visits with his podiatrist with minimal healing. He ultimately underwent right great toe amputation at [facility] this past May which is healed nicely. He is referred back today for dyspnea. This has been an ongoing recurrent problem. He was evaluated by Dr. [name] she will have a 2-D echo Myoview stress test both of which were unremarkable.     Current Meds  Medication Sig  . acetaminophen (TYLENOL) 500 MG tablet Take 1,000 mg by mouth every 8 (eight) hours as needed for moderate pain.   [name] ALPRAZolam (XANAX) 1 MG tablet Take 1 mg by mouth 3 (three) times daily as needed for anxiety.   [name] amphetamine-dextroamphetamine (ADDERALL) 30 MG tablet Take 30 mg by mouth 3 (three) times daily.   [name] atorvastatin (LIPITOR) 10 MG tablet TAKE ONE TABLET BY MOUTH DAILY.  [name] diclofenac sodium (VOLTAREN) 1 % GEL APPLY 2 GRAMS TO EACH KNEE IN THE MORNING AND AT BEDTIME AND APPLY 1 GRAM TO EACH KNEE IN THE AFTERNOON.  [name] divalproex (DEPAKOTE ER) 500 MG 24 hr tablet TAKE (1) TABLET BY MOUTH AT BEDTIME.  . insulin aspart (NOVOLOG FLEXPEN) 100 UNIT/ML FlexPen 3 times a day (just before each meal) 15-15-30 units  . Insulin Glargine (LANTUS SOLOSTAR) 100 UNIT/ML Solostar Pen Inject 70 Units every  morning into the skin.  [name] levothyroxine (SYNTHROID, LEVOTHROID) 50 MCG tablet Take 1 tablet (50 mcg total) by mouth at bedtime.  . Multiple Vitamins-Minerals (ONE-A-DAY MENS 50+ ADVANTAGE PO) Take by mouth.  . ondansetron (ZOFRAN-ODT) 4 MG disintegrating tablet Take 2 tablets (8 mg total) by mouth every 8 (eight) hours as needed for nausea or vomiting.  . pregabalin (LYRICA) 100 MG capsule Take 1 capsule (100 mg total) by mouth 3 (three) times daily.  . protriptyline (VIVACTIL) 10 MG tablet Take 10 mg by mouth 3 (three) times daily.   . ranitidine (ZANTAC) 150 MG tablet Take 150 mg by mouth daily as needed.   [name] REXULTI 1 MG TABS   . SURE COMFORT PEN NEEDLES 31G X 8 MM MISC USE TO INJECT LANTUS AND NOVOLOG DAILY.  [name] TRINTELLIX 20 MG TABS Take 20 mg by mouth at bedtime.  . TRIUMEQ 600-50-300 MG tablet TAKE 1 TABLET BY MOUTH DAILY  . UNABLE TO FIND CPAP MACHINE with standard Aclaim nasal mask with humidifier. Set at 14 cwp (Patient taking differently: CPAP MACHINE with standard Aclaim nasal mask with humidifier. Set at 4 cwp)  . XARELTO 15 MG TABS tablet TAKE ONE TABLET BY MOUTH DAILY.  [name] zolpidem (AMBIEN) 10 MG tablet Take 10-15 mg at bedtime as needed by mouth for sleep.      Allergies  Allergen Reactions  . Aspirin Swelling  . Ibuprofen Swelling  . Sustiva [Efavirenz] Swelling and Rash  And rash.  . Nsaids Other (See Comments)    unknwn    Social History   Socioeconomic History  . Marital status: Single    Spouse name: Not on file  . Number of children: Not on file  . Years of education: Not on file  . Highest education level: Not on file  Social Needs  . Financial resource strain: Not on file  . Food insecurity - worry: Not on file  . Food insecurity - inability: Not on file  . Transportation needs - medical: Not on file  . Transportation needs - non-medical: Not on file  Occupational History  . Not on file  Tobacco Use  . Smoking status: Former Smoker    Packs/day:  0.10    Years: 10.00    Pack years: 1.00    Types: Cigars    Last attempt to quit: 08/09/2014    Years since quitting: 2.4  . Smokeless tobacco: Never Used  Substance and Sexual Activity  . Alcohol use: No    Alcohol/week: 0.0 oz  . Drug use: No  . Sexual activity: Not Currently    Partners: Male    Comment: pt. declined condoms  Other Topics Concern  . Not on file  Social History Narrative   Epworth Sleepiness Scale = 7 (as of 03/16/2015)     Review of Systems: General: negative for chills, fever, night sweats or weight changes.  Cardiovascular: negative for chest pain, dyspnea on exertion, edema, orthopnea, palpitations, paroxysmal nocturnal dyspnea or shortness of breath Dermatological: negative for rash Respiratory: negative for cough or wheezing Urologic: negative for hematuria Abdominal: negative for nausea, vomiting, diarrhea, bright red blood per rectum, melena, or hematemesis Neurologic: negative for visual changes, syncope, or dizziness All other systems reviewed and are otherwise negative except as noted above.    Blood pressure 116/76, pulse 93, height 5' 10"  (1.778 m), weight 249 lb (112.9 kg).  General appearance: alert and no distress Neck: no adenopathy, no carotid bruit, no JVD, supple, symmetrical, trachea midline and thyroid not enlarged, symmetric, no tenderness/mass/nodules Lungs: clear to auscultation bilaterally Heart: regular rate and rhythm, S1, S2 normal, no murmur, click, rub or gallop Extremities: extremities normal, atraumatic, no cyanosis or edema Pulses: 2+ and symmetric Skin: Skin color, texture, turgor normal. No rashes or lesions Neurologic: Alert and oriented X 3, normal strength and tone. Normal symmetric reflexes. Normal coordination and gait  EKG sinus rhythm at 93 with incomplete right bundle branch block. I personally reviewed this EKG.  ASSESSMENT AND PLAN:   Critical lower limb ischemia I saw Mr. Deis year ago for critical limb  ischemia. His Doppler showedtriphasic waveforms in both legs. He underwent right great toe amputation at [facility] in May of this year which has healed nicely.  DOE (dyspnea on exertion) Mr. Zelman was referred back by Dr. Carlota Raspberry for dyspnea. This has not changed in severity. He did have a 2-D echo performed 04/05/15 that showed normal LV systolic function without valvular abnormalities. A Myoview stress test likewise was nonischemic and low risk.he also sees Dr. Melvyn Novas for pulmonary evaluation. I do not think that his dyspnea is cardiovascularly mediated.[name] MD FACP,FACC,FAHA, [facility] 01/20/2017 4:22 PM

## 2017-01-21 ENCOUNTER — Ambulatory Visit (HOSPITAL_COMMUNITY)
Admission: RE | Admit: 2017-01-21 | Discharge: 2017-01-21 | Disposition: A | Discharge status: Home / Self Care | Payer: BLUE CROSS/BLUE SHIELD | Source: Ambulatory Visit | Attending: Family Medicine | Admitting: Family Medicine

## 2017-01-21 ENCOUNTER — Other Ambulatory Visit: Payer: Self-pay | Admitting: Gastroenterology

## 2017-01-21 DIAGNOSIS — M79661 Pain in right lower leg: Secondary | ICD-10-CM | POA: Diagnosis present

## 2017-01-21 DIAGNOSIS — Z7901 Long term (current) use of anticoagulants: Secondary | ICD-10-CM

## 2017-01-21 DIAGNOSIS — R7989 Other specified abnormal findings of blood chemistry: Secondary | ICD-10-CM | POA: Diagnosis not present

## 2017-01-21 DIAGNOSIS — Z86718 Personal history of other venous thrombosis and embolism: Secondary | ICD-10-CM

## 2017-01-21 DIAGNOSIS — M79662 Pain in left lower leg: Secondary | ICD-10-CM | POA: Insufficient documentation

## 2017-01-21 NOTE — Progress Notes (Signed)
Bilateral lower extremity venous duplex completed. No evidence of DVT,superficial thrombosis, or Baker's cyst. No change since study of 12/27/2106.  [facility], [place] 01/21/2017, 1:15 PM

## 2017-01-22 ENCOUNTER — Encounter (HOSPITAL_COMMUNITY): Payer: Self-pay

## 2017-01-22 ENCOUNTER — Other Ambulatory Visit: Payer: Self-pay | Admitting: Endocrinology

## 2017-01-22 ENCOUNTER — Other Ambulatory Visit: Payer: Self-pay | Admitting: Family Medicine

## 2017-01-22 ENCOUNTER — Encounter (HOSPITAL_COMMUNITY): Payer: BLUE CROSS/BLUE SHIELD

## 2017-01-22 ENCOUNTER — Ambulatory Visit (HOSPITAL_COMMUNITY): Payer: BLUE CROSS/BLUE SHIELD

## 2017-01-22 DIAGNOSIS — R29898 Other symptoms and signs involving the musculoskeletal system: Secondary | ICD-10-CM

## 2017-01-22 DIAGNOSIS — R2681 Unsteadiness on feet: Secondary | ICD-10-CM | POA: Diagnosis not present

## 2017-01-22 DIAGNOSIS — I82409 Acute embolism and thrombosis of unspecified deep veins of unspecified lower extremity: Secondary | ICD-10-CM

## 2017-01-22 DIAGNOSIS — M6281 Muscle weakness (generalized): Secondary | ICD-10-CM

## 2017-01-22 NOTE — Therapy (Signed)
[place], [place], [address] Phone: [phone]   Fax:  [phone]  Physical Therapy Treatment  Patient Details  Name: Daniel [name] MRN: [id number] Date of Birth: 01-24-1956 Referring Provider: Melida Quitter    Encounter Date: 01/22/2017  PT End of Session - 01/22/17 1317    Visit Number  7    Number of Visits  14    Date for PT Re-Evaluation  02/03/17    Authorization Type  [employer] Other (covered 100%); 30 visit limit 4 used at eval     Authorization Time Period  12/10/16 to 63/89/37; recert 34/28-76/81     Authorization - Visit Number  7    Authorization - Number of Visits  24    PT Start Time  1309    PT Stop Time  1572    PT Time Calculation (min)  38 min    Equipment Utilized During Treatment  Gait belt    Activity Tolerance  Patient tolerated treatment well    Behavior During Therapy  [facility] for tasks assessed/performed       Past Medical History:  Diagnosis Date  . ADHD (attention deficit hyperactivity disorder)   . Anxiety   . Chronic kidney disease   . Clotting disorder ([place])   . Depression   . Diabetes mellitus without complication ([place])   . Diabetes mellitus, type II ([place])   . Dizziness 03/17/2015  . GERD (gastroesophageal reflux disease)   . HIV disease ([place])   . HIV infection ([place])   . Liver disease   . OSA (obstructive sleep apnea) 07/25/2015   Uses CPAP regularly  . Peripheral vascular disease ([place])   . Ulcer     Past Surgical History:  Procedure Laterality Date  . SMALL INTESTINE SURGERY    . STOMACH SURGERY    . TOE AMPUTATION Right 08/2016   right great toe    There were no vitals filed for this visit.  Subjective Assessment - 01/22/17 1316    Subjective  Pt arrived stated he is doing okay.  Reports he began exercises at the [facility] yesterday and is a little productive soreness.      Pertinent History  thrombophlebitis, DM, depression, ADHD, anxiety, hx Hep B, history LBP, hx  HIV infection, toe amputation 7/18    Patient Stated Goals  "get knees replaced, get back worked on, work on my balance"     Currently in Pain?  No/denies            [facility] - 01/22/17 0001      Knee/Hip Exercises: Standing   Forward Lunges  Both;2 sets;10 reps    Forward Lunges Limitations  6in step      Knee/Hip Exercises: Sidelying   Hip ABduction  15 reps;Both      Knee/Hip Exercises: Prone   Other Prone Exercises  quadruped to tall kneeling to half kneeling to stand 1rep with Rt LE leading, unable to complete with Lt LE          Balance Exercises - 01/22/17 1321      Balance Exercises: Standing   Balance Beam  tandem and side step 1RT    Tandem Gait  2 reps;Forward    Sidestepping  2 reps;Theraband          PT Short Term Goals - 01/06/17 1520      PT SHORT TERM GOAL #1   Title  Patient to be compliant with correct  performance of HEP, to be updated PRN     Time  1    Period  Weeks    Status  On-going      PT SHORT TERM GOAL #2   Title  Patient to be educated and participatory in formation of exercise habits, and will report that he has been working on an independent exercise program at least 2x/week in order to improve physical fitness and assist in reducing pain     Time  2    Period  Weeks    Status  On-going        PT Long Term Goals - 01/06/17 [place] #1   Title  Patient to score 5/5 in all tested muscle groups via MMT in order to improve floor to stand and functional balance     Time  4    Period  Weeks    Status  Partially Met      PT LONG TERM GOAL #2   Title  Patient score 24/24 on DGI in order to show improved functional balance/reduced fall risk     Time  4    Period  Weeks    Status  On-going      PT LONG TERM GOAL #3   Title  Patient to be able to complete floor to stand transfer with RPE no more than 3/10 and no shortness of breath on a modified independent basis in order to show  improved functional status     Time  4    Period  Weeks    Status  Partially Met      PT LONG TERM GOAL #4   Title  Patient to report no dizziness with head turns in order to show improvement of symptoms and reduced fall risk     Time  4    Period  Weeks    Status  On-going      PT LONG TERM GOAL #5   Title  Patient to be participatory in regular exercise program, at least 10-15 minutes in duration and 3 days per week, in order to maintain functional gains and promote improved health status     Time  4    Period  Weeks    Status  On-going            Plan - 01/22/17 1349    Clinical Impression Statement  Session foucs with dynamic balance training and functional strenghtening.  Added quadruped activities transitioning to standing with extreme difficulty reported wiht task.  No reports of pain through session.  Reviewed complaince with HEP, pt reports he plans to attend [employer] 3days/week and was encouraged to increase frequency with HEP for maximal benefits.      Rehab Potential  Fair    Clinical Impairments Affecting Rehab Potential  (+) reports he is motivated to try PT; (-) confession he will probably not do his HEP at home, obesity, chronicity of pain, neuropathy     PT Frequency  2x / week    PT Duration  4 weeks    PT Treatment/Interventions  ADLs/Self Care Home Management;Canalith Repostioning;Biofeedback;Moist Heat;Ultrasound;Gait training;Stair training;Functional mobility training;Therapeutic activities;Therapeutic exercise;Balance training;Neuromuscular re-education;Patient/family education;Manual techniques;Energy conservation;Taping;Vestibular    PT Next Visit Plan  dynamic balance, multitasking     PT Home Exercise Plan  give 2nd session (time limitations at eval); heel raises/toe raises; Single leg stance and eye exercises; 11/01: tandem stnace and SLSl; 11/29: abduction  Patient will benefit from skilled therapeutic intervention in order to improve the following  deficits and impairments:  Abnormal gait, Improper body mechanics, Pain, Decreased coordination, Postural dysfunction, Decreased activity tolerance, Decreased strength, Decreased balance  Visit Diagnosis: Unsteadiness on feet  Muscle weakness (generalized)  Other symptoms and signs involving the musculoskeletal system     Problem List Patient Active Problem List   Diagnosis Date Noted  . Morbid obesity due to excess calories ([place]) complicated by DM / hyperlipidemia 01/15/2017  . DOE (dyspnea on exertion) 01/13/2017  . Confusion 09/17/2016  . Chronic migraine 09/17/2016  . Gait abnormality 09/17/2016  . Diabetic foot infection ([place]) 06/28/2016  . Screening examination for venereal disease 02/11/2016  . Critical lower limb ischemia 12/07/2015  . Fall 12/05/2015  . Toe ulcer, right ([place]) 09/19/2015  . Decreased pedal pulses 09/19/2015  . OSA on CPAP 09/05/2015  . Major depressive disorder, recurrent episode, moderate ([place]) 09/05/2015  . OSA (obstructive sleep apnea) 07/25/2015  . Passed out [facility]) 06/28/2015  . Low back pain 06/11/2015  . Abnormality of gait 06/11/2015  . Dizziness 03/17/2015  . Weakness 02/21/2015  . Chronic renal insufficiency, stage III (moderate) ([place]) 08/09/2014  . Diabetes ([place]) 11/07/2013  . Hematuria 06/21/2013  . Hepatic steatosis 09/09/2010  . UTI 09/15/2006  . PYURIA 09/03/2006  . Human immunodeficiency virus (HIV) disease ([place]) 06/04/2006  . HERPES ZOSTER, UNCOMPLICATED 39/35/9409  . HSV 06/04/2006  . Depression 06/04/2006  . DISORDER, ATTENTION DEFICIT W/HYPERACTIVITY 06/04/2006  . THROMBOPHLEBITIS NOS 06/04/2006  . GERD 06/04/2006  . ARTHRITIS, HAND 06/04/2006  . HYPERGLYCEMIA, HX OF 06/04/2006  . HEPATITIS B, HX OF 06/04/2006   Ihor Austin, LPTA; [place]  Aldona Lento 01/22/2017, 1:56 PM  [place] [address] [place], [place], [address] Phone: [phone]   Fax:   [phone]  Name: HOWARD BUNTE MRN: [id number] Date of Birth: 09-Jan-1956

## 2017-01-22 NOTE — Telephone Encounter (Signed)
Not on our protocol list

## 2017-01-22 NOTE — Patient Instructions (Addendum)
Abduction: Side Leg Lift (Eccentric) - Side-Lying    Lie on side. Lift top leg slightly higher than shoulder level. Keep top leg straight with body, toes pointing forward. Slowly lower for 3-5 seconds. 10-15 reps per set, 2 sets per day, 4 days per week.  http://ecce.exer.us/62   Copyright  VHI. All rights reserved.   Tandem Stance    Right foot in front of left, heel touching toe both feet "straight ahead". Stand on Foot Triangle of Support with both feet. Balance in this position 30 seconds. Do with left foot in front of right.  Copyright  VHI. All rights reserved. [name]  SINGLE LIMB STANCE    Stance: single leg on floor. Raise leg. Hold 30 seconds. Repeat with other leg. 5 reps per set, 2 sets per day,.  Copyright  VHI. All rights reserved.   Heel Raise (Calf Strength / Balance)    Stand with support, ___ lb weights on ankles. Breathe in. Rise up on tiptoes, breathing out through pursed lips. Hold position to count of 5". Return slowly, breathing in. Repeat 15 times per session. Do___ sessions per day. Variation: Do without weights.  Copyright  VHI. All rights reserved.

## 2017-01-23 NOTE — Addendum Note (Signed)
Addended by: Zebedee Iba on: 01/23/2017 04:14 PM   Modules accepted: Orders

## 2017-01-23 NOTE — Telephone Encounter (Signed)
Refilled

## 2017-01-27 ENCOUNTER — Ambulatory Visit (HOSPITAL_COMMUNITY): Payer: BLUE CROSS/BLUE SHIELD | Attending: Otolaryngology

## 2017-01-27 DIAGNOSIS — R29898 Other symptoms and signs involving the musculoskeletal system: Secondary | ICD-10-CM

## 2017-01-27 DIAGNOSIS — M6281 Muscle weakness (generalized): Secondary | ICD-10-CM | POA: Diagnosis present

## 2017-01-27 DIAGNOSIS — R2681 Unsteadiness on feet: Secondary | ICD-10-CM | POA: Diagnosis present

## 2017-01-27 DIAGNOSIS — R29818 Other symptoms and signs involving the nervous system: Secondary | ICD-10-CM | POA: Insufficient documentation

## 2017-01-27 NOTE — Therapy (Signed)
[name], [place], [address] Phone: [phone]   Fax:  [phone]  Physical Therapy Treatment  Patient Details  Name: Daniel [name] MRN: [id number] Date of Birth: Mar 01, 1955 Referring Provider: Melida Quitter    Encounter Date: 01/27/2017  PT End of Session - 01/27/17 1433    Visit Number  8    Number of Visits  14    Date for PT Re-Evaluation  02/03/17    Authorization Type  BC[employer] Other (covered 100%); 30 visit limit 4 used at eval     Authorization Time Period  12/10/16 to 37/85/88; recert 50/27-74/12     Authorization - Visit Number  8    Authorization - Number of Visits  24    PT Start Time  8786    PT Stop Time  1516    PT Time Calculation (min)  43 min    Equipment Utilized During Treatment  --    Activity Tolerance  Patient tolerated treatment well    Behavior During Therapy  [facility] for tasks assessed/performed       Past Medical History:  Diagnosis Date  . ADHD (attention deficit hyperactivity disorder)   . Anxiety   . Chronic kidney disease   . Clotting disorder ([place])   . Depression   . Diabetes mellitus without complication ([place])   . Diabetes mellitus, type II ([place])   . Dizziness 03/17/2015  . GERD (gastroesophageal reflux disease)   . HIV disease ([place])   . HIV infection ([place])   . Liver disease   . OSA (obstructive sleep apnea) 07/25/2015   Uses CPAP regularly  . Peripheral vascular disease ([place])   . Ulcer     Past Surgical History:  Procedure Laterality Date  . SMALL INTESTINE SURGERY    . STOMACH SURGERY    . TOE AMPUTATION Right 08/2016   right great toe    There were no vitals filed for this visit.  Subjective Assessment - 01/27/17 1437    Subjective  Pt arrived stating he is doing okay today but has some tingling in BLE from his neuropathy and states if he forgets his lyrica it goes crazy. Reports he began exercises at the [facility] last week and he is planning to go tomorrow as he  scheduled another session with the trainer. Patient reports he hasn't been keeping up with his HEP at all and that he has a difficult time being motivated at home.   Pertinent History  thrombophlebitis, DM, depression, ADHD, anxiety, hx Hep B, history LBP, hx HIV infection, toe amputation 7/18    Currently in Pain?  Yes    Pain Score  2  neuropathy    Pain Location  Foot    Pain Orientation  Left;Right    Pain Descriptors / Indicators  Tingling    Pain Type  Chronic pain    Pain Onset  More than a month ago    Pain Frequency  Constant    Aggravating Factors   walking    Pain Relieving Factors  rest    Effect of Pain on Daily Activities  pushes through it    Multiple Pain Sites  No       [facility] Adult PT Treatment/Exercise - 01/27/17 0001      Knee/Hip Exercises: Aerobic   Nustep  5 minutes at level 6       Knee/Hip Exercises: Standing   Forward Step Up  1 set;20 reps;Both;Hand Hold: 1;Step Height:  6"      Knee/Hip Exercises: Supine   Bridges  Strengthening;1 set;20 reps      Knee/Hip Exercises: Sidelying   Hip ABduction  20 reps;Strengthening;Limitations    Hip ABduction Limitations  patient requires tactile and verbal cues to achieve proper position irritated feet on dorsal surface.      Balance Exercises - 01/27/17 1513      Balance Exercises: Standing   Standing Eyes Closed  Narrow base of support (BOS);Foam/compliant surface;30 secs;Limitations;5 reps perterbations for last 3 sets for postural righting    Tandem Stance  Eyes closed;Foam/compliant surface;3 reps;30 secs    Rockerboard  Lateral;Other time (comment);EO;Intermittent UE support 2x 1 minute    Marching Limitations  BLE 10 x 5 second holds on foam intermittent HHA       PT Education - 01/27/17 1629    Education provided  Yes    Education Details  Educated on benefits of fitness facilty and using his [employer]. Educated on importance of participating in his HEP to progress towards goals. Instructed in  proper form for exercises throughout session.    Person(s) Educated  Patient    Methods  Explanation;Tactile cues    Comprehension  Need further instruction;Verbalized understanding       PT Short Term Goals - 01/06/17 1520      PT SHORT TERM GOAL #1   Title  Patient to be compliant with correct performance of HEP, to be updated PRN     Time  1    Period  Weeks    Status  On-going      PT SHORT TERM GOAL #2   Title  Patient to be educated and participatory in formation of exercise habits, and will report that he has been working on an independent exercise program at least 2x/week in order to improve physical fitness and assist in reducing pain     Time  2    Period  Weeks    Status  On-going        PT Long Term Goals - 01/06/17 1520      PT [place] #1   Title  Patient to score 5/5 in all tested muscle groups via MMT in order to improve floor to stand and functional balance     Time  4    Period  Weeks    Status  Partially Met      PT LONG TERM GOAL #2   Title  Patient score 24/24 on DGI in order to show improved functional balance/reduced fall risk     Time  4    Period  Weeks    Status  On-going      PT LONG TERM GOAL #3   Title  Patient to be able to complete floor to stand transfer with RPE no more than 3/10 and no shortness of breath on a modified independent basis in order to show improved functional status     Time  4    Period  Weeks    Status  Partially Met      PT LONG TERM GOAL #4   Title  Patient to report no dizziness with head turns in order to show improvement of symptoms and reduced fall risk     Time  4    Period  Weeks    Status  On-going      PT LONG TERM GOAL #5   Title  Patient to be participatory in regular exercise program, at  least 10-15 minutes in duration and 3 days per week, in order to maintain functional gains and promote improved health status     Time  4    Period  Weeks    Status  On-going       Plan - 01/27/17 1631     Clinical Impression Statement  Patient is progressing in therapy and advanced dynamic balance training and functional strengthening during today's session. Patient continues to have difficulty and report fatigue with functional hip strengthening and balance activities, and has limited postural righting and balance reactions. Mr. Shirer continues to require re-enforcement and encouragement to participate in his HEP, but has demonstrated some improved independence with fitness by initiating a program at his [employer]. He will continue to benefit from skilled PT to address current impairments and progress towards goals to improve QOL.    Rehab Potential  Fair    Clinical Impairments Affecting Rehab Potential  (+) reports he is motivated to try PT; (-) confession he will probably not do his HEP at home, obesity, chronicity of pain, neuropathy     PT Frequency  2x / week    PT Duration  4 weeks    PT Treatment/Interventions  ADLs/Self Care Home Management;Canalith Repostioning;Biofeedback;Moist Heat;Ultrasound;Gait training;Stair training;Functional mobility training;Therapeutic activities;Therapeutic exercise;Balance training;Neuromuscular re-education;Patient/family education;Manual techniques;Energy conservation;Taping;Vestibular    PT Next Visit Plan  Continue to challenge dynamic balance and dual task activities, incorporate more dynamic gait next session. Update HEP.    PT Home Exercise Plan  give 2nd session (time limitations at eval); heel raises/toe raises; Single leg stance and eye exercises; 11/01: tandem stnace and SLSl; 11/29: abduction    Consulted and Agree with Plan of Care  Patient       Patient will benefit from skilled therapeutic intervention in order to improve the following deficits and impairments:  Abnormal gait, Improper body mechanics, Pain, Decreased coordination, Postural dysfunction, Decreased activity tolerance, Decreased strength, Decreased balance  Visit Diagnosis: Unsteadiness on  feet  Muscle weakness (generalized)  Other symptoms and signs involving the musculoskeletal system     Problem List Patient Active Problem List   Diagnosis Date Noted  . Morbid obesity due to excess calories ([place]) complicated by DM / hyperlipidemia 01/15/2017  . DOE (dyspnea on exertion) 01/13/2017  . Confusion 09/17/2016  . Chronic migraine 09/17/2016  . Gait abnormality 09/17/2016  . Diabetic foot infection ([place]) 06/28/2016  . Screening examination for venereal disease 02/11/2016  . Critical lower limb ischemia 12/07/2015  . Fall 12/05/2015  . Toe ulcer, right ([place]) 09/19/2015  . Decreased pedal pulses 09/19/2015  . OSA on CPAP 09/05/2015  . Major depressive disorder, recurrent episode, moderate ([employer]) 09/05/2015  . OSA (obstructive sleep apnea) 07/25/2015  . Passed out [facility]) 06/28/2015  . Low back pain 06/11/2015  . Abnormality of gait 06/11/2015  . Dizziness 03/17/2015  . Weakness 02/21/2015  . Chronic renal insufficiency, stage III (moderate) ([place]) 08/09/2014  . Diabetes ([place]) 11/07/2013  . Hematuria 06/21/2013  . Hepatic steatosis 09/09/2010  . UTI 09/15/2006  . PYURIA 09/03/2006  . Human immunodeficiency virus (HIV) disease ([place]) 06/04/2006  . HERPES ZOSTER, UNCOMPLICATED 15/40/0867  . HSV 06/04/2006  . Depression 06/04/2006  . DISORDER, ATTENTION DEFICIT W/HYPERACTIVITY 06/04/2006  . THROMBOPHLEBITIS NOS 06/04/2006  . GERD 06/04/2006  . ARTHRITIS, HAND 06/04/2006  . HYPERGLYCEMIA, HX OF 06/04/2006  . HEPATITIS B, HX OF 06/04/2006    Debara Pickett, PT, DPT Physical Therapist with [facility]  01/27/2017 4:41 PM  [place], [place], [address] Phone: [phone]   Fax:  [phone]  Name: Daniel [name] MRN: [id number] Date of Birth: 1955-09-08

## 2017-01-29 ENCOUNTER — Ambulatory Visit (HOSPITAL_COMMUNITY): Payer: BLUE CROSS/BLUE SHIELD

## 2017-01-29 ENCOUNTER — Telehealth (HOSPITAL_COMMUNITY): Payer: Self-pay

## 2017-01-29 NOTE — Telephone Encounter (Signed)
Pt called and states he is not feeling like coming today

## 2017-02-03 ENCOUNTER — Other Ambulatory Visit: Payer: Self-pay

## 2017-02-03 ENCOUNTER — Other Ambulatory Visit: Payer: BLUE CROSS/BLUE SHIELD

## 2017-02-03 ENCOUNTER — Ambulatory Visit (HOSPITAL_COMMUNITY): Payer: BLUE CROSS/BLUE SHIELD

## 2017-02-03 ENCOUNTER — Encounter (HOSPITAL_COMMUNITY): Payer: Self-pay

## 2017-02-03 DIAGNOSIS — R29898 Other symptoms and signs involving the musculoskeletal system: Secondary | ICD-10-CM

## 2017-02-03 DIAGNOSIS — R2681 Unsteadiness on feet: Secondary | ICD-10-CM

## 2017-02-03 DIAGNOSIS — M6281 Muscle weakness (generalized): Secondary | ICD-10-CM

## 2017-02-03 DIAGNOSIS — R29818 Other symptoms and signs involving the nervous system: Secondary | ICD-10-CM

## 2017-02-03 NOTE — Therapy (Signed)
[place], [place], [address] Phone: [phone]   Fax:  [phone]  Physical Therapy Treatment/Re-Assessment  Patient Details  Name: Daniel [name] MRN: [id number] Date of Birth: [birth year] Referring Provider: Melida Quitter   Encounter Date: 02/03/2017  PT End of Session - 02/03/17 1619    Visit Number  9    Number of Visits  14    Date for PT Re-Evaluation  02/03/17    Authorization Type  [employer] Other (covered 100%); 30 visit limit 4 used at eval     Authorization Time Period  12/10/16 to 47/65/46; recert 50/35-46/56     Authorization - Visit Number  9    Authorization - Number of Visits  24    PT Start Time  8127 patient arrived late    PT Stop Time  1517    PT Time Calculation (min)  36 min    Activity Tolerance  Patient tolerated treatment well    Behavior During Therapy  [facility] for tasks assessed/performed       Past Medical History:  Diagnosis Date  . ADHD (attention deficit hyperactivity disorder)   . Anxiety   . Chronic kidney disease   . Clotting disorder ([place])   . Depression   . Diabetes mellitus without complication ([place])   . Diabetes mellitus, type II ([place])   . Dizziness 03/17/2015  . GERD (gastroesophageal reflux disease)   . HIV disease ([place])   . HIV infection ([place])   . Liver disease   . OSA (obstructive sleep apnea) 07/25/2015   Uses CPAP regularly  . Peripheral vascular disease ([place])   . Ulcer     Past Surgical History:  Procedure Laterality Date  . SMALL INTESTINE SURGERY    . STOMACH SURGERY    . TOE AMPUTATION Right 08/2016   right great toe    There were no vitals filed for this visit.  Subjective Assessment - 02/03/17 1442    Subjective  Patient reports he fell backward into a pile of snow when going up snowy steps with no railing yesterday. He feels like he pulled a muscle on the right side of his neck when this happened and states it is painful to turn his head. He states  he feels his balance is still very off and that he can feel himself "wobbling" in shower when he closes his eyes. He reports he has fallen out of the shower before but not recently. He really wants his feet to stop hurting. Later today, he is going to dig his brother's truck out of the snow. States no one else can help his brother "all his friends are dead or in another state". He reports he has sentencing tomorrow for what was written as a DUI because he was driving while under the influence of his prescription medications. He is a little worried about what they will do tomorrow. He would like to continue with therapy as he feels he needs to for balance.    Pertinent History  thrombophlebitis, DM, depression, ADHD, anxiety, hx Hep B, history LBP, hx HIV infection, toe amputation 7/18    Patient Stated Goals  "get knees replaced, get back worked on, work on my balance"     Pain Score  3     Pain Location  Foot    Pain Orientation  Right;Left    Pain Descriptors / Indicators  Numbness    Pain Type  Chronic pain    Pain  Onset  More than a month ago    Pain Frequency  Constant    Aggravating Factors   walking    Pain Relieving Factors  rest    Effect of Pain on Daily Activities  works through it    Multiple Pain Sites  Yes    Pain Score  3    Pain Location  Neck    Pain Orientation  Right    Pain Descriptors / Indicators  Aching;Discomfort    Pain Type  Acute pain    Pain Onset  Yesterday    Pain Frequency  Intermittent    Aggravating Factors   turning neck to look behind    Pain Relieving Factors  icy-hot    Effect of Pain on Daily Activities  mild         [facility] PT Assessment - 02/03/17 0001      Assessment   Medical Diagnosis  balance     Referring Provider  Melida Quitter    Next MD Visit  none with Dr. Redmond Baseman     Prior Therapy  PT here for his back/toe wound       Precautions   Precautions  Fall      Balance Screen   Has the patient fallen in the past 6 months  Yes       Strength   Right Hip Flexion  5/5    Right Hip ABduction  4+/5    Left Hip Flexion  5/5    Left Hip ABduction  5/5    Right Knee Flexion  5/5    Right Knee Extension  5/5    Left Knee Flexion  5/5    Left Knee Extension  5/5    Right Ankle Dorsiflexion  5/5    Left Ankle Dorsiflexion  5/5      Standardized Balance Assessment   Standardized Balance Assessment  Dynamic Gait Index      Dynamic Gait Index   Level Surface  Normal    Change in Gait Speed  Normal    Gait with Horizontal Head Turns  Mild Impairment    Gait with Vertical Head Turns  Mild Impairment    Gait and Pivot Turn  Mild Impairment    Step Over Obstacle  Mild Impairment    Step Around Obstacles  Mild Impairment    Steps  Mild Impairment    Total Score  18       [facility] Adult PT Treatment/Exercise - 02/03/17 0001      Knee/Hip Exercises: Aerobic   Nustep  6 minutes on level 4      Balance Exercises - 02/03/17 1450      Balance Exercises: Standing   Standing Eyes Closed  Narrow base of support (BOS);Foam/compliant surface;30 secs;Limitations;5 reps    Tandem Stance  Eyes closed;Foam/compliant surface;3 reps;30 secs    Rockerboard  Lateral;Other time (comment);EO;Intermittent UE support;Anterior/posterior 2x 1 minute lateral and fwd/bkwd       PT Education - 02/03/17 1617    Education provided  Yes    Education Details  Educated on [place] of participation with HEp and [employer]. Discussed ways for patient to motivate himself with creating a reward system for performign his exercises at home. Educated on proper technique and cues for weight shifting to maintain balance. Cautioned patient to not shovel snow with his brother given his fall yesterday and poor balance results today on DGI. Educated on prognosis with therapy and need for increased participation in HEP to  progress with POC.    Person(s) Educated  Patient    Methods  Explanation    Comprehension  Verbalized understanding;Need further instruction        PT Short Term Goals - 02/03/17 1625      PT SHORT TERM GOAL #1   Title  Patient to be compliant with correct performance of HEP, to be updated PRN     Baseline  12/11 - not aprticipating regularly    Time  1    Period  Weeks    Status  On-going      PT SHORT TERM GOAL #2   Title  Patient to be educated and participatory in formation of exercise habits, and will report that he has been working on an independent exercise program at least 2x/week in order to improve physical fitness and assist in reducing pain     Baseline  12/11 - Participating in local program at [place] last 2 weeks    Time  2    Period  Weeks    Status  Partially Met        PT Long Term Goals - 02/03/17 1627      PT [place] #1   Title  Patient to score 5/5 in all tested muscle groups via MMT in order to improve floor to stand and functional balance     Baseline  12/5 - Right hip abduction remain 4+/5, all others 5/5    Time  4    Period  Weeks    Status  Partially Met      PT LONG TERM GOAL #2   Title  Patient score 24/24 on DGI in order to show improved functional balance/reduced fall risk     Baseline  12/11 - 18/24    Time  4    Period  Weeks    Status  On-going      PT LONG TERM GOAL #3   Title  Patient to be able to complete floor to stand transfer with RPE no more than 3/10 and no shortness of breath on a modified independent basis in order to show improved functional status     Time  4    Period  Weeks    Status  On-going      PT LONG TERM GOAL #4   Title  Patient to report no dizziness with head turns in order to show improvement of symptoms and reduced fall risk     Baseline  12/11 - ongoing    Time  4    Period  Weeks    Status  On-going      PT LONG TERM GOAL #5   Title  Patient to be participatory in regular exercise program, at least 10-15 minutes in duration and 3 days per week, in order to maintain functional gains and promote improved health status     Baseline   12/11 -[facility]x/week at [facility]    Time  4    Period  Weeks    Status  On-going        Plan - 02/03/17 1621    Clinical Impression Statement  Re-assessment performed today. Patient is making slow progress in therapy and is continuing to advance dynamic balance training and functional strengthening. He continues to have difficulty and report fatigue with functional hip strengthening and balance activities, he has limited postural righting and balance reactions. Daniel [name] reported a fall yesterday where he lost his balance backwards and has a DGI  of 18/24 today indicating he is at fall risk. He continues to require re-enforcement and encouragement to participate in his HEP however has demonstrated some increased independence with participating at his [employer]. He will continue to benefit from skilled PT to address current impairments and progress towards goals to improve QOL.    Rehab Potential  Fair    Clinical Impairments Affecting Rehab Potential  (+) reports he is motivated to try PT; (-) confession he will probably not do his HEP at home, obesity, chronicity of pain, neuropathy     PT Frequency  2x / week    PT Duration  4 weeks    PT Treatment/Interventions  ADLs/Self Care Home Management;Canalith Repostioning;Biofeedback;Moist Heat;Ultrasound;Gait training;Stair training;Functional mobility training;Therapeutic activities;Therapeutic exercise;Balance training;Neuromuscular re-education;Patient/family education;Manual techniques;Energy conservation;Taping;Vestibular    PT Next Visit Plan  Continue to challenge dynamic balance and dual task activities, incorporate more dynamic gait next session. Create motivatin method/reward system for patient to participate in HEP.    PT Home Exercise Plan  give 2nd session (time limitations at eval); heel raises/toe raises; Single leg stance and eye exercises; 11/01: tandem stnace and SLSl; 11/29: abduction    Consulted and Agree with Plan of Care  Patient        Patient will benefit from skilled therapeutic intervention in order to improve the following deficits and impairments:  Abnormal gait, Improper body mechanics, Pain, Decreased coordination, Postural dysfunction, Decreased activity tolerance, Decreased strength, Decreased balance  Visit Diagnosis: Unsteadiness on feet  Muscle weakness (generalized)  Other symptoms and signs involving the musculoskeletal system  Other symptoms and signs involving the nervous system     Problem List Patient Active Problem List   Diagnosis Date Noted  . Morbid obesity due to excess calories ([place]) complicated by DM / hyperlipidemia 01/15/2017  . DOE (dyspnea on exertion) 01/13/2017  . Confusion 09/17/2016  . Chronic migraine 09/17/2016  . Gait abnormality 09/17/2016  . Diabetic foot infection ([place]) 06/28/2016  . Screening examination for venereal disease 02/11/2016  . Critical lower limb ischemia 12/07/2015  . Fall 12/05/2015  . Toe ulcer, right ([place]) 09/19/2015  . Decreased pedal pulses 09/19/2015  . OSA on CPAP 09/05/2015  . Major depressive disorder, recurrent episode, moderate ([place]) 09/05/2015  . OSA (obstructive sleep apnea) 07/25/2015  . Passed out [facility]) 06/28/2015  . Low back pain 06/11/2015  . Abnormality of gait 06/11/2015  . Dizziness 03/17/2015  . Weakness 02/21/2015  . Chronic renal insufficiency, stage III (moderate) ([place]) 08/09/2014  . Diabetes ([place]) 11/07/2013  . Hematuria 06/21/2013  . Hepatic steatosis 09/09/2010  . UTI 09/15/2006  . PYURIA 09/03/2006  . Human immunodeficiency virus (HIV) disease ([place]) 06/04/2006  . HERPES ZOSTER, UNCOMPLICATED 66/29/4765  . HSV 06/04/2006  . Depression 06/04/2006  . DISORDER, ATTENTION DEFICIT W/HYPERACTIVITY 06/04/2006  . THROMBOPHLEBITIS NOS 06/04/2006  . GERD 06/04/2006  . ARTHRITIS, HAND 06/04/2006  . HYPERGLYCEMIA, HX OF 06/04/2006  . HEPATITIS B, HX OF 06/04/2006    Kipp Brood, PT, DPT Physical Therapist with  [facility]  02/03/2017 4:36 PM    [place] [address] [place], [place], [address] Phone: [phone]   Fax:  [phone]  Name: Daniel [name] MRN: [id number] Date of Birth: 07/20/1955

## 2017-02-05 ENCOUNTER — Ambulatory Visit (HOSPITAL_COMMUNITY): Payer: BLUE CROSS/BLUE SHIELD

## 2017-02-05 ENCOUNTER — Encounter (HOSPITAL_COMMUNITY): Payer: Self-pay

## 2017-02-05 DIAGNOSIS — R2681 Unsteadiness on feet: Secondary | ICD-10-CM | POA: Diagnosis not present

## 2017-02-05 DIAGNOSIS — R29818 Other symptoms and signs involving the nervous system: Secondary | ICD-10-CM

## 2017-02-05 DIAGNOSIS — M6281 Muscle weakness (generalized): Secondary | ICD-10-CM

## 2017-02-05 DIAGNOSIS — R29898 Other symptoms and signs involving the musculoskeletal system: Secondary | ICD-10-CM

## 2017-02-05 NOTE — Therapy (Signed)
[place], [place], [address] Phone: [phone]   Fax:  [phone]  Physical Therapy Treatment  Patient Details  Name: Daniel [name] MRN: [id number] Date of Birth: 02-May-1955 Referring Provider: Melida Quitter   Encounter Date: 02/05/2017  PT End of Session - 02/05/17 1509    Visit Number  10    Number of Visits  14    Authorization Type  [employer] Other (covered 100%); 30 visit limit 4 used at eval     Authorization Time Period  12/10/16 to 02/77/41; recert 28/78-67/67; 20/94-->09/01/6281    Authorization - Visit Number  10    Authorization - Number of Visits  24    PT Start Time  6629 pt late for apt    PT Stop Time  1530 EOS at 1520, Nustep 10 min not included wiht billing    PT Time Calculation (min)  48 min    Equipment Utilized During Treatment  Gait belt    Activity Tolerance  Patient tolerated treatment well    Behavior During Therapy  [facility] for tasks assessed/performed       Past Medical History:  Diagnosis Date  . ADHD (attention deficit hyperactivity disorder)   . Anxiety   . Chronic kidney disease   . Clotting disorder ([place])   . Depression   . Diabetes mellitus without complication ([place])   . Diabetes mellitus, type II ([place])   . Dizziness 03/17/2015  . GERD (gastroesophageal reflux disease)   . HIV disease ([place])   . HIV infection ([place])   . Liver disease   . OSA (obstructive sleep apnea) 07/25/2015   Uses CPAP regularly  . Peripheral vascular disease ([place])   . Ulcer     Past Surgical History:  Procedure Laterality Date  . SMALL INTESTINE SURGERY    . STOMACH SURGERY    . TOE AMPUTATION Right 08/2016   right great toe    There were no vitals filed for this visit.  Subjective Assessment - 02/05/17 1441    Subjective  Pt stated he has no further falls following the fall in snow earlier this week, pain minimal on posterior neck with fall.  Reports he had the court sentence for the DUI because  of the influence of presciption medicaion, has lost his license temporary.  Has plans to get another attorney concerning the prescription medication DUI.      Patient Stated Goals  "get knees replaced, get back worked on, work on my balance"     Currently in Pain?  Yes    Pain Score  1     Pain Location  Neck    Pain Orientation  Posterior    Pain Type  Chronic pain    Pain Onset  More than a month ago    Pain Frequency  Intermittent    Aggravating Factors   neck pain only with head movements    Pain Relieving Factors  rest    Effect of Pain on Daily Activities  works through it                      [facility] Adult PT Treatment/Exercise - 02/05/17 0001      Knee/Hip Exercises: Aerobic   Nustep  10 min L6 at EOS not included with billing          Balance Exercises - 02/05/17 1512      Balance Exercises: Standing   Tandem Stance  Eyes  open;Foam/compliant surface tandem stance on foam wiht palvo RTB 4x 15 each position    SLS  Eyes open;3 reps    SLS with Vectors  -- next session    Wall Bumps  Shoulder;Hip    Wall Bumps-Shoulders  Eyes opened;Anterior/posterior;10 reps    Wall Bumps-Hips  Eyes opened;Anterior/posterior;10 reps    Balance Beam  tandem and side step 2RT    Tandem Gait  1 rep          PT Short Term Goals - 02/03/17 1625      PT SHORT TERM GOAL #1   Title  Patient to be compliant with correct performance of HEP, to be updated PRN     Baseline  12/11 - not aprticipating regularly    Time  1    Period  Weeks    Status  On-going      PT SHORT TERM GOAL #2   Title  Patient to be educated and participatory in formation of exercise habits, and will report that he has been working on an independent exercise program at least 2x/week in order to improve physical fitness and assist in reducing pain     Baseline  12/11 - Participating in local program at [place] last 2 weeks    Time  2    Period  Weeks    Status  Partially Met        PT Long  Term Goals - 02/03/17 [place] #1   Title  Patient to score 5/5 in all tested muscle groups via MMT in order to improve floor to stand and functional balance     Baseline  12/5 - Right hip abduction remain 4+/5, all others 5/5    Time  4    Period  Weeks    Status  Partially Met      PT LONG TERM GOAL #2   Title  Patient score 24/24 on DGI in order to show improved functional balance/reduced fall risk     Baseline  12/11 - 18/24    Time  4    Period  Weeks    Status  On-going      PT LONG TERM GOAL #3   Title  Patient to be able to complete floor to stand transfer with RPE no more than 3/10 and no shortness of breath on a modified independent basis in order to show improved functional status     Time  4    Period  Weeks    Status  On-going      PT LONG TERM GOAL #4   Title  Patient to report no dizziness with head turns in order to show improvement of symptoms and reduced fall risk     Baseline  12/11 - ongoing    Time  4    Period  Weeks    Status  On-going      PT LONG TERM GOAL #5   Title  Patient to be participatory in regular exercise program, at least 10-15 minutes in duration and 3 days per week, in order to maintain functional gains and promote improved health status     Baseline  12/11 -1x/week at [facility]    Time  4    Period  Weeks    Status  On-going            Plan - 02/05/17 1523    Clinical Impression Statement  Session focus on dynamic  balance activities.  Pt improving static tandem stance with min cueing for core activation and spatial awareness.  Added palvo and wall bumps to improve core activation for dynamic balance training.  Reviewed importance of compliance with HEP for maximal benefits.  Pt was late for apt today so was unable to create motavation/encouragement reward program, plan to begin next session.      Rehab Potential  Fair    Clinical Impairments Affecting Rehab Potential  (+) reports he is motivated to try PT; (-)  confession he will probably not do his HEP at home, obesity, chronicity of pain, neuropathy     PT Frequency  2x / week    PT Duration  4 weeks    PT Treatment/Interventions  ADLs/Self Care Home Management;Canalith Repostioning;Biofeedback;Moist Heat;Ultrasound;Gait training;Stair training;Functional mobility training;Therapeutic activities;Therapeutic exercise;Balance training;Neuromuscular re-education;Patient/family education;Manual techniques;Energy conservation;Taping;Vestibular    PT Next Visit Plan  Continue to challenge dynamic balance and dual task activities, incorporate more dynamic gait next session. Create motivatin method/reward system for patient to participate in HEP.    PT Home Exercise Plan  give 2nd session (time limitations at eval); heel raises/toe raises; Single leg stance and eye exercises; 11/01: tandem stnace and SLSl; 11/29: abduction       Patient will benefit from skilled therapeutic intervention in order to improve the following deficits and impairments:  Abnormal gait, Improper body mechanics, Pain, Decreased coordination, Postural dysfunction, Decreased activity tolerance, Decreased strength, Decreased balance  Visit Diagnosis: Unsteadiness on feet  Muscle weakness (generalized)  Other symptoms and signs involving the musculoskeletal system  Other symptoms and signs involving the nervous system     Problem List Patient Active Problem List   Diagnosis Date Noted  . Morbid obesity due to excess calories ([place]) complicated by DM / hyperlipidemia 01/15/2017  . DOE (dyspnea on exertion) 01/13/2017  . Confusion 09/17/2016  . Chronic migraine 09/17/2016  . Gait abnormality 09/17/2016  . Diabetic foot infection ([place]) 06/28/2016  . Screening examination for venereal disease 02/11/2016  . Critical lower limb ischemia 12/07/2015  . Fall 12/05/2015  . Toe ulcer, right ([place]) 09/19/2015  . Decreased pedal pulses 09/19/2015  . OSA on CPAP 09/05/2015  . Major  depressive disorder, recurrent episode, moderate ([place]) 09/05/2015  . OSA (obstructive sleep apnea) 07/25/2015  . Passed out [facility]) 06/28/2015  . Low back pain 06/11/2015  . Abnormality of gait 06/11/2015  . Dizziness 03/17/2015  . Weakness 02/21/2015  . Chronic renal insufficiency, stage III (moderate) ([place]) 08/09/2014  . Diabetes ([place]) 11/07/2013  . Hematuria 06/21/2013  . Hepatic steatosis 09/09/2010  . UTI 09/15/2006  . PYURIA 09/03/2006  . Human immunodeficiency virus (HIV) disease ([place]) 06/04/2006  . HERPES ZOSTER, UNCOMPLICATED 83/25/4982  . HSV 06/04/2006  . Depression 06/04/2006  . DISORDER, ATTENTION DEFICIT W/HYPERACTIVITY 06/04/2006  . THROMBOPHLEBITIS NOS 06/04/2006  . GERD 06/04/2006  . ARTHRITIS, HAND 06/04/2006  . HYPERGLYCEMIA, HX OF 06/04/2006  . HEPATITIS B, HX OF 06/04/2006   Ihor Austin, LPTA; [place]  Aldona Lento 02/05/2017, 3:29 PM  [place] [address] [place], [place], [address] Phone: [phone]   Fax:  [phone]  Name: Daniel [name] MRN: [id number] Date of Birth: 08-20-1955

## 2017-02-10 ENCOUNTER — Ambulatory Visit
Admission: RE | Admit: 2017-02-10 | Discharge: 2017-02-10 | Disposition: A | Discharge status: Home / Self Care | Payer: BLUE CROSS/BLUE SHIELD | Source: Ambulatory Visit | Attending: Gastroenterology | Admitting: Gastroenterology

## 2017-02-10 ENCOUNTER — Other Ambulatory Visit: Payer: Self-pay

## 2017-02-10 ENCOUNTER — Ambulatory Visit (HOSPITAL_COMMUNITY): Payer: BLUE CROSS/BLUE SHIELD

## 2017-02-10 ENCOUNTER — Ambulatory Visit: Payer: BLUE CROSS/BLUE SHIELD | Admitting: Sports Medicine

## 2017-02-10 ENCOUNTER — Encounter (HOSPITAL_COMMUNITY): Payer: Self-pay

## 2017-02-10 DIAGNOSIS — R2681 Unsteadiness on feet: Secondary | ICD-10-CM | POA: Diagnosis not present

## 2017-02-10 DIAGNOSIS — M6281 Muscle weakness (generalized): Secondary | ICD-10-CM

## 2017-02-10 DIAGNOSIS — R29818 Other symptoms and signs involving the nervous system: Secondary | ICD-10-CM

## 2017-02-10 DIAGNOSIS — Z7901 Long term (current) use of anticoagulants: Secondary | ICD-10-CM

## 2017-02-10 DIAGNOSIS — Z86718 Personal history of other venous thrombosis and embolism: Secondary | ICD-10-CM

## 2017-02-10 DIAGNOSIS — R29898 Other symptoms and signs involving the musculoskeletal system: Secondary | ICD-10-CM

## 2017-02-10 NOTE — Therapy (Signed)
[place], [place], [address] Phone: [phone]   Fax:  [phone]  Physical Therapy Treatment  Patient Details  Name: Daniel [name] MRN: [id number] Date of Birth: 1955/08/15 Referring Provider: Melida Quitter   Encounter Date: 02/10/2017  PT End of Session - 02/10/17 1301    Visit Number  11    Number of Visits  14    Authorization Type  [employer] Other (covered 100%); 30 visit limit 4 used at eval     Authorization Time Period  12/10/16 to 83/25/49; recert 82/64-15/83; 09/40-->7/68/0881    Authorization - Visit Number  11    Authorization - Number of Visits  24    PT Start Time  1031    PT Stop Time  1345    PT Time Calculation (min)  43 min    Equipment Utilized During Treatment  Gait belt    Activity Tolerance  Patient tolerated treatment well    Behavior During Therapy  [facility] for tasks assessed/performed       Past Medical History:  Diagnosis Date  . ADHD (attention deficit hyperactivity disorder)   . Anxiety   . Chronic kidney disease   . Clotting disorder ([place])   . Depression   . Diabetes mellitus without complication ([place])   . Diabetes mellitus, type II ([place])   . Dizziness 03/17/2015  . GERD (gastroesophageal reflux disease)   . HIV disease ([place])   . HIV infection ([place])   . Liver disease   . OSA (obstructive sleep apnea) 07/25/2015   Uses CPAP regularly  . Peripheral vascular disease ([place])   . Ulcer     Past Surgical History:  Procedure Laterality Date  . SMALL INTESTINE SURGERY    . STOMACH SURGERY    . TOE AMPUTATION Right 08/2016   right great toe    There were no vitals filed for this visit.  Subjective Assessment - 02/10/17 1306    Subjective  Going to [place] for podiatry appointment. Patient denies falls and denies pain today. He continues to report non-compliance with HEP and has not returned to [facility].     Pertinent History  thrombophlebitis, DM, depression, ADHD, anxiety, hx Hep  B, history LBP, hx HIV infection, toe amputation 7/18    Patient Stated Goals  "get knees replaced, get back worked on, work on my balance"     Currently in Pain?  No/denies    Pain Onset  --       [facility] Adult PT Treatment/Exercise - 02/10/17 0001      Knee/Hip Exercises: Aerobic   Nustep  4 minutes on level 6 at EOS (not included in billing)      Knee/Hip Exercises: Standing   Forward Step Up  1 set;20 reps;Both;Step Height: 6";Hand Hold: 0    Rocker Board  4 minutes;Limitations    Rocker Board Limitations  2x 1 minute A/P; 2 x 1 minute latearl      Balance Exercises - 02/10/17 1317      Balance Exercises: Standing   Standing Eyes Closed  Solid surface;2 reps;30 secs tandem, intermitten hand support    Tandem Stance  Eyes open;Foam/compliant surface;30 secs;4 reps alternate foot position    SLS with Vectors  --    Balance Beam  tandem/retro 3x each and side step 2x both directions    Sidestepping  Limitations;Other (comment) side step over 4 low hurdles, 2x both directions    Marching Limitations  BLE 2 x  1 minute on foam intermittent HHA, 5 second holds for march    Other Standing Exercises  Foam/compliant surface;Upper extremity assist 2;Intermittent upper extremity assist;Other reps (comment);Limitations: 10 reps BLE tap 3 cones        PT Education - 02/10/17 8413    Education provided  Yes    Education Details  Educated on improtance of HEP participation and on proper form throughout therapy. Discussed motivation plan and reward system for participation in HEP, patient continues to be resistant. Also disucssed alteranatives to sugar and improtance of monitorign blood sugar, patient resistant to health changes.    Person(s) Educated  Patient    Methods  Explanation    Comprehension  Verbalized understanding;Need further instruction       PT Short Term Goals - 02/03/17 1625      PT SHORT TERM GOAL #1   Title  Patient to be compliant with correct performance of HEP, to be  updated PRN     Baseline  12/11 - not aprticipating regularly    Time  1    Period  Weeks    Status  On-going      PT SHORT TERM GOAL #2   Title  Patient to be educated and participatory in formation of exercise habits, and will report that he has been working on an independent exercise program at least 2x/week in order to improve physical fitness and assist in reducing pain     Baseline  12/11 - Participating in local program at [place] last 2 weeks    Time  2    Period  Weeks    Status  Partially Met        PT Long Term Goals - 02/03/17 1627      PT [place] #1   Title  Patient to score 5/5 in all tested muscle groups via MMT in order to improve floor to stand and functional balance     Baseline  12/5 - Right hip abduction remain 4+/5, all others 5/5    Time  4    Period  Weeks    Status  Partially Met      PT LONG TERM GOAL #2   Title  Patient score 24/24 on DGI in order to show improved functional balance/reduced fall risk     Baseline  12/11 - 18/24    Time  4    Period  Weeks    Status  On-going      PT LONG TERM GOAL #3   Title  Patient to be able to complete floor to stand transfer with RPE no more than 3/10 and no shortness of breath on a modified independent basis in order to show improved functional status     Time  4    Period  Weeks    Status  On-going      PT LONG TERM GOAL #4   Title  Patient to report no dizziness with head turns in order to show improvement of symptoms and reduced fall risk     Baseline  12/11 - ongoing    Time  4    Period  Weeks    Status  On-going      PT LONG TERM GOAL #5   Title  Patient to be participatory in regular exercise program, at least 10-15 minutes in duration and 3 days per week, in order to maintain functional gains and promote improved health status     Baseline  12/11 -1x/week  at [facility]    Time  4    Period  Weeks    Status  On-going         Plan - 02/10/17 1301    Clinical Impression Statement   Patient continues to make limited progress with PT. Today session continued to advance dynamic balance activities. Introduced retro gait for dynamic balance challenge, patient requires cues for weight shift to prevent LOB. Therapist continues to review importance of compliance with HEP for maximal benefits and improvements with balance and strength. Patient remains resistant to developing plan for motivation towards completing HEP and participating in [facility] program, will continue to discuss.    Rehab Potential  Fair    Clinical Impairments Affecting Rehab Potential  (+) reports he is motivated to try PT; (-) confession he will probably not do his HEP at home, obesity, chronicity of pain, neuropathy     PT Frequency  2x / week    PT Duration  4 weeks    PT Treatment/Interventions  ADLs/Self Care Home Management;Canalith Repostioning;Biofeedback;Moist Heat;Ultrasound;Gait training;Stair training;Functional mobility training;Therapeutic activities;Therapeutic exercise;Balance training;Neuromuscular re-education;Patient/family education;Manual techniques;Energy conservation;Taping;Vestibular    PT Next Visit Plan  Update HEP and create motivatin method/reward system for patient to participate in HEP and [employer] program. Continue to challenge dynamic balance and dual task activities, incorporate more dynamic gait next session. Give patient form to request health records for PT. Discuss healthy nutrition choices to manage DM.   PT Home Exercise Plan  give 2nd session (time limitations at eval); heel raises/toe raises; Single leg stance and eye exercises; 11/01: tandem stnace and SLSl; 11/29: abduction    Consulted and Agree with Plan of Care  Patient       Patient will benefit from skilled therapeutic intervention in order to improve the following deficits and impairments:  Abnormal gait, Improper body mechanics, Pain, Decreased coordination, Postural dysfunction, Decreased activity tolerance, Decreased strength,  Decreased balance  Visit Diagnosis: Unsteadiness on feet  Muscle weakness (generalized)  Other symptoms and signs involving the musculoskeletal system  Other symptoms and signs involving the nervous system     Problem List Patient Active Problem List   Diagnosis Date Noted  . Morbid obesity due to excess calories ([place]) complicated by DM / hyperlipidemia 01/15/2017  . DOE (dyspnea on exertion) 01/13/2017  . Confusion 09/17/2016  . Chronic migraine 09/17/2016  . Gait abnormality 09/17/2016  . Diabetic foot infection ([place]) 06/28/2016  . Screening examination for venereal disease 02/11/2016  . Critical lower limb ischemia 12/07/2015  . Fall 12/05/2015  . Toe ulcer, right ([place]) 09/19/2015  . Decreased pedal pulses 09/19/2015  . OSA on CPAP 09/05/2015  . Major depressive disorder, recurrent episode, moderate ([place]) 09/05/2015  . OSA (obstructive sleep apnea) 07/25/2015  . Passed out [facility]) 06/28/2015  . Low back pain 06/11/2015  . Abnormality of gait 06/11/2015  . Dizziness 03/17/2015  . Weakness 02/21/2015  . Chronic renal insufficiency, stage III (moderate) ([place]) 08/09/2014  . Diabetes ([place]) 11/07/2013  . Hematuria 06/21/2013  . Hepatic steatosis 09/09/2010  . UTI 09/15/2006  . PYURIA 09/03/2006  . Human immunodeficiency virus (HIV) disease ([place]) 06/04/2006  . HERPES ZOSTER, UNCOMPLICATED 48/18/5631  . HSV 06/04/2006  . Depression 06/04/2006  . DISORDER, ATTENTION DEFICIT W/HYPERACTIVITY 06/04/2006  . THROMBOPHLEBITIS NOS 06/04/2006  . GERD 06/04/2006  . ARTHRITIS, HAND 06/04/2006  . HYPERGLYCEMIA, HX OF 06/04/2006  . HEPATITIS B, HX OF 06/04/2006    Daniel [name], PT, DPT Physical Therapist with [facility]  02/10/2017 4:14 PM    [place] [address] [place], [place], [address] Phone: [phone]   Fax:  [phone]  Name: Daniel [name] MRN: [id number] Date of Birth:  10-06-1955

## 2017-02-11 ENCOUNTER — Ambulatory Visit: Payer: BLUE CROSS/BLUE SHIELD | Admitting: Sports Medicine

## 2017-02-12 ENCOUNTER — Encounter (HOSPITAL_COMMUNITY): Payer: BLUE CROSS/BLUE SHIELD

## 2017-02-13 ENCOUNTER — Ambulatory Visit (HOSPITAL_COMMUNITY): Payer: BLUE CROSS/BLUE SHIELD

## 2017-02-13 ENCOUNTER — Encounter (HOSPITAL_COMMUNITY): Payer: Self-pay

## 2017-02-13 DIAGNOSIS — M6281 Muscle weakness (generalized): Secondary | ICD-10-CM

## 2017-02-13 DIAGNOSIS — R2681 Unsteadiness on feet: Secondary | ICD-10-CM | POA: Diagnosis not present

## 2017-02-13 DIAGNOSIS — R29898 Other symptoms and signs involving the musculoskeletal system: Secondary | ICD-10-CM

## 2017-02-13 DIAGNOSIS — R29818 Other symptoms and signs involving the nervous system: Secondary | ICD-10-CM

## 2017-02-13 NOTE — Therapy (Signed)
Daniel [name], [place], [address] Phone: [phone]   Fax:  [phone]  Physical Therapy Treatment  Patient Details  Name: Daniel [name] MRN: [id number] Date of Birth: 27-Feb-1955 Referring Provider: Melida Quitter   Encounter Date: 02/13/2017  PT End of Session - 02/13/17 1526    Visit Number  12    Number of Visits  14    Date for PT Re-Evaluation  03/06/17    Authorization Type  [employer] Other (covered 100%); 30 visit limit 4 used at eval     Authorization Time Period  12/10/16 to 03/00/92; recert 33/00-76/22; 63/33-->5/45/6256    Authorization - Visit Number  12    Authorization - Number of Visits  24    PT Start Time  3893    PT Stop Time  1612 last 5 min on Nustep- no charge    PT Time Calculation (min)  51 min    Equipment Utilized During Treatment  Gait belt    Activity Tolerance  Patient tolerated treatment well    Behavior During Therapy  [facility] for tasks assessed/performed       Past Medical History:  Diagnosis Date  . ADHD (attention deficit hyperactivity disorder)   . Anxiety   . Chronic kidney disease   . Clotting disorder ([place])   . Depression   . Diabetes mellitus without complication ([place])   . Diabetes mellitus, type II ([place])   . Dizziness 03/17/2015  . GERD (gastroesophageal reflux disease)   . HIV disease ([place])   . HIV infection ([place])   . Liver disease   . OSA (obstructive sleep apnea) 07/25/2015   Uses CPAP regularly  . Peripheral vascular disease ([place])   . Ulcer     Past Surgical History:  Procedure Laterality Date  . SMALL INTESTINE SURGERY    . STOMACH SURGERY    . TOE AMPUTATION Right 08/2016   right great toe    There were no vitals filed for this visit.  Subjective Assessment - 02/13/17 1519    Subjective  Pt reports he has neck and back pain and feet continue to bother him, no current pain.  Does feel his balance is improving slowly.    Pertinent History  thrombophlebitis,  DM, depression, ADHD, anxiety, hx Hep B, history LBP, hx HIV infection, toe amputation 7/18    Patient Stated Goals  "get knees replaced, get back worked on, work on my balance"     Currently in Pain?  No/denies                      [facility] Adult PT Treatment/Exercise - 02/13/17 0001      Knee/Hip Exercises: Aerobic   Nustep  5 minutes on level 6 at EOS (not included in billing)      Knee/Hip Exercises: Standing   Functional Squat  10 reps    Rocker Board  2 minutes    Rocker Board Limitations  2x 1 minute A/P; 2 x 1 minute latearl          Balance Exercises - 02/13/17 1547      Balance Exercises: Standing   Tandem Stance  Eyes open;Foam/compliant surface;30 secs;4 reps    SLS  Eyes open;3 reps    Rockerboard  Anterior/posterior;Lateral    Balance Beam  tandem/retro 3x each and side step 2x both directions    Step Over Hurdles / [place]  6 and 12in hurdles 2RT forward and sidestep  Other Standing Exercises  scarf throw forward and backwards down long hallway        PT Education - 02/13/17 1608    Education provided  Yes    Education Details  Pt signed form to request health records for PT.  Educated on importance of HEP compliance for maximal benefits and beginning walking program to address weight gain.  Continued discussion of alternatives to sugar and importance of monitoring blood sugar, pt verbally understood and agreed but is patient resistant to health changes    Person(s) Educated  Patient    Methods  Explanation    Comprehension  Verbalized understanding;Returned demonstration;Need further instruction       PT Short Term Goals - 02/03/17 1625      PT SHORT TERM GOAL #1   Title  Patient to be compliant with correct performance of HEP, to be updated PRN     Baseline  12/11 - not aprticipating regularly    Time  1    Period  Weeks    Status  On-going      PT SHORT TERM GOAL #2   Title  Patient to be educated and participatory in formation of  exercise habits, and will report that he has been working on an independent exercise program at least 2x/week in order to improve physical fitness and assist in reducing pain     Baseline  12/11 - Participating in local program at [place] last 2 weeks    Time  2    Period  Weeks    Status  Partially Met        PT Long Term Goals - 02/03/17 1627      PT [place] #1   Title  Patient to score 5/5 in all tested muscle groups via MMT in order to improve floor to stand and functional balance     Baseline  12/5 - Right hip abduction remain 4+/5, all others 5/5    Time  4    Period  Weeks    Status  Partially Met      PT LONG TERM GOAL #2   Title  Patient score 24/24 on DGI in order to show improved functional balance/reduced fall risk     Baseline  12/11 - 18/24    Time  4    Period  Weeks    Status  On-going      PT LONG TERM GOAL #3   Title  Patient to be able to complete floor to stand transfer with RPE no more than 3/10 and no shortness of breath on a modified independent basis in order to show improved functional status     Time  4    Period  Weeks    Status  On-going      PT LONG TERM GOAL #4   Title  Patient to report no dizziness with head turns in order to show improvement of symptoms and reduced fall risk     Baseline  12/11 - ongoing    Time  4    Period  Weeks    Status  On-going      PT LONG TERM GOAL #5   Title  Patient to be participatory in regular exercise program, at least 10-15 minutes in duration and 3 days per week, in order to maintain functional gains and promote improved health status     Baseline  12/11 -[facility]x/week at [facility]    Time  4    Period  Weeks    Status  On-going            Plan - 02/13/17 1615    Clinical Impression Statement  Session focus on advanced dyanamic balance activities.  Added scarf throwing with forward and retro gait and increased height/demand with hurdles with min A required for safety.  Continued to discuss/educate  importance of HEP compliance for maximal benefits, pt verbally understand but remain resistant to change in routine and health.  Pt did discuss possible adoption of dog, pt encouraged to begin walking program with pet as motativation.  Pt signed form to retreive health records upon request.      Rehab Potential  Fair    Clinical Impairments Affecting Rehab Potential  (+) reports he is motivated to try PT; (-) confession he will probably not do his HEP at home, obesity, chronicity of pain, neuropathy     PT Frequency  2x / week    PT Duration  4 weeks    PT Treatment/Interventions  ADLs/Self Care Home Management;Canalith Repostioning;Biofeedback;Moist Heat;Ultrasound;Gait training;Stair training;Functional mobility training;Therapeutic activities;Therapeutic exercise;Balance training;Neuromuscular re-education;Patient/family education;Manual techniques;Energy conservation;Taping;Vestibular    PT Next Visit Plan  Update HEP and create motivatin method/reward system for patient to participate in HEP and [employer] program. Continue to challenge dynamic balance and dual task activities, incorporate more dynamic gait next session. F/U on giving health records to pt as he signed request today (02/13/17)- see front desk for record [place]  give 2nd session (time limitations at eval); heel raises/toe raises; Single leg stance and eye exercises; 11/01: tandem stnace and SLSl; 11/29: abduction       Patient will benefit from skilled therapeutic intervention in order to improve the following deficits and impairments:  Abnormal gait, Improper body mechanics, Pain, Decreased coordination, Postural dysfunction, Decreased activity tolerance, Decreased strength, Decreased balance  Visit Diagnosis: Unsteadiness on feet  Muscle weakness (generalized)  Other symptoms and signs involving the musculoskeletal system  Other symptoms and signs involving the nervous system     Problem  List Patient Active Problem List   Diagnosis Date Noted  . Morbid obesity due to excess calories ([place]) complicated by DM / hyperlipidemia 01/15/2017  . DOE (dyspnea on exertion) 01/13/2017  . Confusion 09/17/2016  . Chronic migraine 09/17/2016  . Gait abnormality 09/17/2016  . Diabetic foot infection ([place]) 06/28/2016  . Screening examination for venereal disease 02/11/2016  . Critical lower limb ischemia 12/07/2015  . Fall 12/05/2015  . Toe ulcer, right ([place]) 09/19/2015  . Decreased pedal pulses 09/19/2015  . OSA on CPAP 09/05/2015  . Major depressive disorder, recurrent episode, moderate ([place]) 09/05/2015  . OSA (obstructive sleep apnea) 07/25/2015  . Passed out [facility]) 06/28/2015  . Low back pain 06/11/2015  . Abnormality of gait 06/11/2015  . Dizziness 03/17/2015  . Weakness 02/21/2015  . Chronic renal insufficiency, stage III (moderate) ([place]) 08/09/2014  . Diabetes ([place]) 11/07/2013  . Hematuria 06/21/2013  . Hepatic steatosis 09/09/2010  . UTI 09/15/2006  . PYURIA 09/03/2006  . Human immunodeficiency virus (HIV) disease ([place]) 06/04/2006  . HERPES ZOSTER, UNCOMPLICATED 33/29/5188  . HSV 06/04/2006  . Depression 06/04/2006  . DISORDER, ATTENTION DEFICIT W/HYPERACTIVITY 06/04/2006  . THROMBOPHLEBITIS NOS 06/04/2006  . GERD 06/04/2006  . ARTHRITIS, HAND 06/04/2006  . HYPERGLYCEMIA, HX OF 06/04/2006  . HEPATITIS B, HX OF 06/04/2006   Ihor Austin, LPTA; [place]  Aldona Lento 02/13/2017, 4:24 PM  [facility]  [address] [place], [place], [address] Phone: [phone]   Fax:  [phone]  Name: JORELL AGNE MRN: [id number] Date of Birth: 03/03/1955

## 2017-02-18 ENCOUNTER — Encounter (HOSPITAL_COMMUNITY): Payer: Self-pay

## 2017-02-18 ENCOUNTER — Ambulatory Visit (HOSPITAL_COMMUNITY): Payer: BLUE CROSS/BLUE SHIELD

## 2017-02-18 DIAGNOSIS — R2681 Unsteadiness on feet: Secondary | ICD-10-CM | POA: Diagnosis not present

## 2017-02-18 DIAGNOSIS — R29898 Other symptoms and signs involving the musculoskeletal system: Secondary | ICD-10-CM

## 2017-02-18 DIAGNOSIS — M6281 Muscle weakness (generalized): Secondary | ICD-10-CM

## 2017-02-18 DIAGNOSIS — R29818 Other symptoms and signs involving the nervous system: Secondary | ICD-10-CM

## 2017-02-18 NOTE — Therapy (Signed)
[place], [place], [address] Phone: [phone]   Fax:  [phone]  Physical Therapy Treatment  Patient Details  Name: Daniel [name] MRN: [id number] Date of Birth: [birth year] Referring Provider: Melida Quitter   Encounter Date: 02/18/2017  PT End of Session - 02/18/17 1740    Visit Number  13    Number of Visits  14    Date for PT Re-Evaluation  03/06/17    Authorization Type  [employer] Other (covered 100%); 30 visit limit 4 used at eval     Authorization Time Period  12/10/16 to 08/67/61; recert 95/09-32/67; 12/45-->10/03/9831    Authorization - Visit Number  13    Authorization - Number of Visits  24    PT Start Time  8250    PT Stop Time  5397 last 10 min on nustep    PT Time Calculation (min)  48 min    Equipment Utilized During Treatment  Gait belt    Activity Tolerance  Patient tolerated treatment well    Behavior During Therapy  [facility] for tasks assessed/performed       Past Medical History:  Diagnosis Date  . ADHD (attention deficit hyperactivity disorder)   . Anxiety   . Chronic kidney disease   . Clotting disorder ([place])   . Depression   . Diabetes mellitus without complication ([place])   . Diabetes mellitus, type II ([place])   . Dizziness 03/17/2015  . GERD (gastroesophageal reflux disease)   . HIV disease ([place])   . HIV infection ([place])   . Liver disease   . OSA (obstructive sleep apnea) 07/25/2015   Uses CPAP regularly  . Peripheral vascular disease ([place])   . Ulcer     Past Surgical History:  Procedure Laterality Date  . SMALL INTESTINE SURGERY    . STOMACH SURGERY    . TOE AMPUTATION Right 08/2016   right great toe    There were no vitals filed for this visit.  Subjective Assessment - 02/18/17 1731    Subjective  Pt stated his ankle were sore following last sessoin, no reports of real pain currently.      Pertinent History  thrombophlebitis, DM, depression, ADHD, anxiety, hx Hep B, history  LBP, hx HIV infection, toe amputation 7/18    Patient Stated Goals  "get knees replaced, get back worked on, work on my balance"     Currently in Pain?  No/denies                      [facility] Adult PT Treatment/Exercise - 02/18/17 0001      Knee/Hip Exercises: Standing   Functional Squat  15 reps    Rocker Board  2 minutes    Rocker Board Limitations  -- DF/PF and R/L          Balance Exercises - 02/18/17 1805      Balance Exercises: Standing   SLS  Eyes open;3 reps Lt 30", Rt 20    SLS with Vectors  3 reps 3x 5" BLE    Rockerboard  Anterior/posterior;Lateral;Intermittent UE support 2 min    Balance Beam  tandem, 12" hurdles, retro, sidestep 2RT    Other Standing Exercises  scarf throw forward and backwards down long hallway; pickup scarf if drops on floor (~6 times)          PT Short Term Goals - 02/03/17 1625      PT SHORT TERM GOAL #1  Title  Patient to be compliant with correct performance of HEP, to be updated PRN     Baseline  12/11 - not aprticipating regularly    Time  1    Period  Weeks    Status  On-going      PT SHORT TERM GOAL #2   Title  Patient to be educated and participatory in formation of exercise habits, and will report that he has been working on an independent exercise program at least 2x/week in order to improve physical fitness and assist in reducing pain     Baseline  12/11 - Participating in local program at [place] last 2 weeks    Time  2    Period  Weeks    Status  Partially Met        PT Long Term Goals - 02/03/17 1627      PT [place] #1   Title  Patient to score 5/5 in all tested muscle groups via MMT in order to improve floor to stand and functional balance     Baseline  12/5 - Right hip abduction remain 4+/5, all others 5/5    Time  4    Period  Weeks    Status  Partially Met      PT LONG TERM GOAL #2   Title  Patient score 24/24 on DGI in order to show improved functional balance/reduced fall risk      Baseline  12/11 - 18/24    Time  4    Period  Weeks    Status  On-going      PT LONG TERM GOAL #3   Title  Patient to be able to complete floor to stand transfer with RPE no more than 3/10 and no shortness of breath on a modified independent basis in order to show improved functional status     Time  4    Period  Weeks    Status  On-going      PT LONG TERM GOAL #4   Title  Patient to report no dizziness with head turns in order to show improvement of symptoms and reduced fall risk     Baseline  12/11 - ongoing    Time  4    Period  Weeks    Status  On-going      PT LONG TERM GOAL #5   Title  Patient to be participatory in regular exercise program, at least 10-15 minutes in duration and 3 days per week, in order to maintain functional gains and promote improved health status     Baseline  12/11 -1x/week at [facility]    Time  4    Period  Weeks    Status  On-going            Plan - 02/18/17 1815    Clinical Impression Statement  Continued session focus on advanced balance activiteis and hip stabilty to assist with SLS.  Added vector stance and hurdles with balance beam for stability with min A for LOB episodes.  Pt is improving SLS and tandem on flat surfaces.  Cotninues to discuss importance of compliance iwht HEP for maximal benefits to reach personal goals.      Rehab Potential  Fair    Clinical Impairments Affecting Rehab Potential  (+) reports he is motivated to try PT; (-) confession he will probably not do his HEP at home, obesity, chronicity of pain, neuropathy     PT Frequency  2x /  week    PT Duration  4 weeks    PT Treatment/Interventions  ADLs/Self Care Home Management;Canalith Repostioning;Biofeedback;Moist Heat;Ultrasound;Gait training;Stair training;Functional mobility training;Therapeutic activities;Therapeutic exercise;Balance training;Neuromuscular re-education;Patient/family education;Manual techniques;Energy conservation;Taping;Vestibular    PT Next Visit Plan   Reassess next session.  Update HEP and create motivatin method/reward system for patient to participate in HEP and [employer] program. Continue to challenge dynamic balance and dual task activities, incorporate more dynamic gait next session. F/U on giving health records to pt as he signed request today (02/13/17)- see front desk for record [place]  give 2nd session (time limitations at eval); heel raises/toe raises; Single leg stance and eye exercises; 11/01: tandem stnace and SLSl; 11/29: abduction       Patient will benefit from skilled therapeutic intervention in order to improve the following deficits and impairments:  Abnormal gait, Improper body mechanics, Pain, Decreased coordination, Postural dysfunction, Decreased activity tolerance, Decreased strength, Decreased balance  Visit Diagnosis: Unsteadiness on feet  Muscle weakness (generalized)  Other symptoms and signs involving the musculoskeletal system  Other symptoms and signs involving the nervous system     Problem List Patient Active Problem List   Diagnosis Date Noted  . Morbid obesity due to excess calories ([place]) complicated by DM / hyperlipidemia 01/15/2017  . DOE (dyspnea on exertion) 01/13/2017  . Confusion 09/17/2016  . Chronic migraine 09/17/2016  . Gait abnormality 09/17/2016  . Diabetic foot infection ([place]) 06/28/2016  . Screening examination for venereal disease 02/11/2016  . Critical lower limb ischemia 12/07/2015  . Fall 12/05/2015  . Toe ulcer, right ([place]) 09/19/2015  . Decreased pedal pulses 09/19/2015  . OSA on CPAP 09/05/2015  . Major depressive disorder, recurrent episode, moderate ([place]) 09/05/2015  . OSA (obstructive sleep apnea) 07/25/2015  . Passed out [facility]) 06/28/2015  . Low back pain 06/11/2015  . Abnormality of gait 06/11/2015  . Dizziness 03/17/2015  . Weakness 02/21/2015  . Chronic renal insufficiency, stage III (moderate) ([place]) 08/09/2014  . Diabetes ([place])  11/07/2013  . Hematuria 06/21/2013  . Hepatic steatosis 09/09/2010  . UTI 09/15/2006  . PYURIA 09/03/2006  . Human immunodeficiency virus (HIV) disease ([place]) 06/04/2006  . HERPES ZOSTER, UNCOMPLICATED 63/87/5643  . HSV 06/04/2006  . Depression 06/04/2006  . DISORDER, ATTENTION DEFICIT W/HYPERACTIVITY 06/04/2006  . THROMBOPHLEBITIS NOS 06/04/2006  . GERD 06/04/2006  . ARTHRITIS, HAND 06/04/2006  . HYPERGLYCEMIA, HX OF 06/04/2006  . HEPATITIS B, HX OF 06/04/2006   Ihor Austin, LPTA; [place]  Aldona Lento 02/18/2017, 6:24 PM  [facility] [address] [place], [place], [address] Phone: [phone]   Fax:  [phone]  Name: Daniel [name] MRN: [id number] Date of Birth: 16-Jul-1955

## 2017-02-19 ENCOUNTER — Encounter (HOSPITAL_COMMUNITY): Payer: Self-pay

## 2017-02-19 ENCOUNTER — Other Ambulatory Visit: Payer: Self-pay

## 2017-02-19 ENCOUNTER — Ambulatory Visit (HOSPITAL_COMMUNITY): Payer: BLUE CROSS/BLUE SHIELD

## 2017-02-19 DIAGNOSIS — R29898 Other symptoms and signs involving the musculoskeletal system: Secondary | ICD-10-CM

## 2017-02-19 DIAGNOSIS — R2681 Unsteadiness on feet: Secondary | ICD-10-CM | POA: Diagnosis not present

## 2017-02-19 DIAGNOSIS — M6281 Muscle weakness (generalized): Secondary | ICD-10-CM

## 2017-02-19 DIAGNOSIS — R29818 Other symptoms and signs involving the nervous system: Secondary | ICD-10-CM

## 2017-02-19 NOTE — Therapy (Signed)
`[facility] [place], [place], [address] Phone: [phone]   Fax:  [phone]  Physical Therapy Treatment/Discharge Summary  Patient Details  Name: Daniel [name] MRN: [id number] Date of Birth: 1955-08-11 Referring Provider: Melida Quitter   Encounter Date: 02/19/2017  PT End of Session - 02/19/17 1441    Visit Number  14    Number of Visits  14    Date for PT Re-Evaluation  03/06/17    Authorization Type  [employer] Other (covered 100%); 30 visit limit 4 used at eval     Authorization Time Period  12/10/16 to 16/07/37; recert 10/62-69/48; 54/62-->08/27/5007    Authorization - Visit Number  14    Authorization - Number of Visits  24    PT Start Time  3818    PT Stop Time  1515    PT Time Calculation (min)  38 min    Equipment Utilized During Treatment  --    Activity Tolerance  Patient tolerated treatment well    Behavior During Therapy  [facility] for tasks assessed/performed       Past Medical History:  Diagnosis Date  . ADHD (attention deficit hyperactivity disorder)   . Anxiety   . Chronic kidney disease   . Clotting disorder ([place])   . Depression   . Diabetes mellitus without complication ([place])   . Diabetes mellitus, type II ([place])   . Dizziness 03/17/2015  . GERD (gastroesophageal reflux disease)   . HIV disease ([place])   . HIV infection ([place])   . Liver disease   . OSA (obstructive sleep apnea) 07/25/2015   Uses CPAP regularly  . Peripheral vascular disease ([place])   . Ulcer     Past Surgical History:  Procedure Laterality Date  . SMALL INTESTINE SURGERY    . STOMACH SURGERY    . TOE AMPUTATION Right 08/2016   right great toe    There were no vitals filed for this visit.  Subjective Assessment - 02/19/17 1435    Subjective  Patient states his legs are sore from the exercises he did yesterday and that he still has not started his HEP. He states he will try to start them tomorrow.    Pertinent History   thrombophlebitis, DM, depression, ADHD, anxiety, hx Hep B, history LBP, hx HIV infection, toe amputation 7/18    Patient Stated Goals  "get knees replaced, get back worked on, work on my balance"     Currently in Pain?  Yes    Pain Score  3     Pain Location  Leg    Pain Orientation  Right;Left    Pain Descriptors / Indicators  Aching;Sore    Pain Radiating Towards  no    Pain Onset  More than a month ago patient has had chronic pain but describes today's pain as a muscle soreness from yesterday    Pain Frequency  Intermittent       [facility] PT Assessment - 02/19/17 0001      Assessment   Medical Diagnosis  balance     Referring Provider  Melida Quitter    Onset Date/Surgical Date  -- chronic    Next MD Visit  none with Dr. Redmond Baseman  Dr. Nyoka Cowden PCP - going to talk about back pain    Prior Therapy  PT here for his back/toe wound       Observation/Other Assessments   Focus on Therapeutic Outcomes (FOTO)   26% limited 56% limited at  intake (12/10/16)      Strength   Right Hip Flexion  5/5    Right Hip ABduction  5/5    Left Hip Flexion  5/5    Left Hip ABduction  5/5    Right Knee Flexion  5/5    Right Knee Extension  5/5    Left Knee Flexion  5/5    Left Knee Extension  5/5    Right Ankle Dorsiflexion  5/5    Left Ankle Dorsiflexion  5/5      Dynamic Gait Index   Level Surface  Normal    Change in Gait Speed  Normal    Gait with Horizontal Head Turns  Normal    Gait with Vertical Head Turns  Normal    Gait and Pivot Turn  Normal    Step Over Obstacle  Normal    Step Around Obstacles  Mild Impairment    Steps  Mild Impairment    Total Score  22    DGI comment:  22/24 = decreased fall risk with dynamic gait activities      Balance Exercises - 02/19/17 1522      Balance Exercises: Standing   Tandem Gait  Forward;4 reps 15'        PT Education - 02/19/17 1523    Education provided  Yes    Education Details  Extensive time spent today discussing progress patient has made in  therapy, his current limitations, and how to progress with remaining goals. Patient's remaining goals require independence and self-motivation to participate in his HEP and to begin a regular exercise program at the [facility] as he previously was. I discussed possible methods for him to motivate himself by developing a reward system however, patient is reluctant to do so. I reviewed his HEP and educated him on safe form and appropriate frequency of exercises. I educated him to perform all balance activities at his kitchen counter. Patient inquired about if I could treat his low back pain and I educated him that we need a referral to treat his low back. I encouraged him to discuss this with his PCP at his appointment next week if he feels it is limiting his mobility. I also educated him that we can mail his records to him now that he has signed the release form, he stated he would like [name] to do so.    Person(s) Educated  Patient    Methods  Explanation    Comprehension  Verbalized understanding       PT Short Term Goals - 02/19/17 1441      PT SHORT TERM GOAL #1   Title  Patient to be compliant with correct performance of HEP, to be updated PRN     Baseline  12/11 - not aprticipating regularly; 12/27 - still not doing exercises, patient states he will try to start tomorrow    Time  1    Period  Weeks    Status  Not Met      PT SHORT TERM GOAL #2   Title  Patient to be educated and participatory in formation of exercise habits, and will report that he has been working on an independent exercise program at least 2x/week in order to improve physical fitness and assist in reducing pain     Baseline  12/11 - Participating in local program at [place] last 2 weeks; 12/27 - hasn't been abel to set a time to go back    Time  2  Period  Weeks    Status  Partially Met        PT Long Term Goals - 02/19/17 1442      PT LONG TERM GOAL #1   Title  Patient to score 5/5 in all tested muscle groups via  MMT in order to improve floor to stand and functional balance     Baseline  12/5 - Right hip abduction remain 4+/5, all others 5/5; 12/27 - all 5/5 with MMT    Time  4    Period  Weeks    Status  Achieved      PT LONG TERM GOAL #2   Title  Patient score 24/24 on DGI in order to show improved functional balance/reduced fall risk     Baseline  12/11 - (18/24); 12/27 - 22/24 (indicates decreased fall risk)    Time  4    Period  Weeks    Status  Partially Met      PT LONG TERM GOAL #3   Title  Patient to be able to complete floor to stand transfer with RPE no more than 3/10 and no shortness of breath on a modified independent basis in order to show improved functional status     Baseline  12/27 - requires moderate 5/10 effort    Time  4    Period  Weeks    Status  Not Met      PT LONG TERM GOAL #4   Title  Patient to report no dizziness with head turns in order to show improvement of symptoms and reduced fall risk     Baseline  12/11 - ongoing; 12/27 - patient reports decreased symptoms with head turns during gait    Time  4    Period  Weeks    Status  Partially Met      PT LONG TERM GOAL #5   Title  Patient to be participatory in regular exercise program, at least 10-15 minutes in duration and 3 days per week, in order to maintain functional gains and promote improved health status     Baseline  12/11 -1x/week at [facility]; 12/27 - non-compliant    Time  4    Period  Weeks    Status  Not Met       Plan - 02/19/17 1435    Clinical Impression Statement  Re-assessment was performed today and patient has met his goals for bilateral lower extremity strength, and partially met his balance related goals. He scored a 22/24 on the DGI indicating he is at a decreased risk for falling during gait activities. His remaining goals involve participation in his HEP and a regular exercise program at his gym. He has remained non-compliant with his home exercises and has stopped participating at the gym  after approximately 2 weeks. I discussed with the patient his improvements in balance and educated him that the final step is becoming independent and participating in his exercises on his own. While reluctant to discharge from PT at first, however, after explanation, he understood to achieve these final goals on his own as he has met all others and these goals require independence. He will be discharged from PT services.    Rehab Potential  Fair    Clinical Impairments Affecting Rehab Potential  (+) reports he is motivated to try PT; (-) confession he will probably not do his HEP at home, obesity, chronicity of pain, neuropathy     PT Frequency  2x / week  PT Duration  4 weeks    PT Treatment/Interventions  ADLs/Self Care Home Management;Canalith Repostioning;Biofeedback;Moist Heat;Ultrasound;Gait training;Stair training;Functional mobility training;Therapeutic activities;Therapeutic exercise;Balance training;Neuromuscular re-education;Patient/family education;Manual techniques;Energy conservation;Taping;Vestibular    PT Next Visit Plan  Discharging    PT Home Exercise Plan  give 2nd session (time limitations at eval); heel raises/toe raises; Single leg stance and eye exercises; 11/01: tandem stnace and SLSl; 11/29: abduction    Consulted and Agree with Plan of Care  Patient       Patient will benefit from skilled therapeutic intervention in order to improve the following deficits and impairments:  Abnormal gait, Improper body mechanics, Pain, Decreased coordination, Postural dysfunction, Decreased activity tolerance, Decreased strength, Decreased balance  Visit Diagnosis: Unsteadiness on feet  Muscle weakness (generalized)  Other symptoms and signs involving the musculoskeletal system  Other symptoms and signs involving the nervous system     Problem List Patient Active Problem List   Diagnosis Date Noted  . Morbid obesity due to excess calories ([place]) complicated by DM /  hyperlipidemia 01/15/2017  . DOE (dyspnea on exertion) 01/13/2017  . Confusion 09/17/2016  . Chronic migraine 09/17/2016  . Gait abnormality 09/17/2016  . Diabetic foot infection ([place]) 06/28/2016  . Screening examination for venereal disease 02/11/2016  . Critical lower limb ischemia 12/07/2015  . Fall 12/05/2015  . Toe ulcer, right ([place]) 09/19/2015  . Decreased pedal pulses 09/19/2015  . OSA on CPAP 09/05/2015  . Major depressive disorder, recurrent episode, moderate ([place]) 09/05/2015  . OSA (obstructive sleep apnea) 07/25/2015  . Passed out [facility]) 06/28/2015  . Low back pain 06/11/2015  . Abnormality of gait 06/11/2015  . Dizziness 03/17/2015  . Weakness 02/21/2015  . Chronic renal insufficiency, stage III (moderate) ([place]) 08/09/2014  . Diabetes ([place]) 11/07/2013  . Hematuria 06/21/2013  . Hepatic steatosis 09/09/2010  . UTI 09/15/2006  . PYURIA 09/03/2006  . Human immunodeficiency virus (HIV) disease ([place]) 06/04/2006  . HERPES ZOSTER, UNCOMPLICATED 28/78/6767  . HSV 06/04/2006  . Depression 06/04/2006  . DISORDER, ATTENTION DEFICIT W/HYPERACTIVITY 06/04/2006  . THROMBOPHLEBITIS NOS 06/04/2006  . GERD 06/04/2006  . ARTHRITIS, HAND 06/04/2006  . HYPERGLYCEMIA, HX OF 06/04/2006  . HEPATITIS B, HX OF 06/04/2006     PHYSICAL THERAPY DISCHARGE SUMMARY  Visits from Start of Care: 14  Current functional level related to goals / functional outcomes: See above for details.   Remaining deficits: Compliance with HEP. See above for details.   Education / Equipment: Extensive time spent today discussing progress patient has made in therapy, his current limitations, and how to progress with remaining goals. Patient's remaining goals require independence and self-motivation to participate in his HEP and to begin a regular exercise program at the [facility] as he previously was. I discussed possible methods for him to motivate himself by developing a reward system however, patient is  reluctant to do so. I reviewed his HEP and educated him on safe form and appropriate frequency of exercises. I educated him to perform all balance activities at his kitchen counter. Patient inquired about if I could treat his low back pain and I educated him that we need a referral to treat his low back. I encouraged him to discuss this with his PCP at his appointment next week if he feels it is limiting his mobility. I also educated him that we can mail his records to him now that he has signed the release form, he stated he would like [name] to do so.    Plan: Patient agrees  to discharge.  Patient goals were partially met. Patient is being discharged due to being pleased with the current functional level.  ?????       Kipp Brood, PT, DPT Physical Therapist with [facility]  02/19/2017 3:56 PM    [facility] [address] [place], [place], [address] Phone: [phone]   Fax:  [phone]  Name: Daniel [name] MRN: [id number] Date of Birth: 05/10/1955

## 2017-02-20 ENCOUNTER — Other Ambulatory Visit: Payer: Self-pay | Admitting: Family Medicine

## 2017-02-20 ENCOUNTER — Other Ambulatory Visit: Payer: Self-pay | Admitting: Endocrinology

## 2017-02-20 NOTE — Telephone Encounter (Signed)
Pt requesting refill on Voltaren Gel.  LOV 01/08/17 with Dr. Carlota Raspberry

## 2017-02-22 NOTE — Telephone Encounter (Signed)
Ok to refill - sent Rx.

## 2017-02-23 ENCOUNTER — Encounter (HOSPITAL_COMMUNITY): Payer: BLUE CROSS/BLUE SHIELD

## 2017-02-25 ENCOUNTER — Ambulatory Visit (HOSPITAL_COMMUNITY): Payer: BLUE CROSS/BLUE SHIELD

## 2017-02-26 ENCOUNTER — Ambulatory Visit (HOSPITAL_COMMUNITY): Payer: BLUE CROSS/BLUE SHIELD

## 2017-02-26 ENCOUNTER — Other Ambulatory Visit: Payer: Self-pay

## 2017-02-26 ENCOUNTER — Encounter: Payer: Self-pay | Admitting: Family Medicine

## 2017-02-26 ENCOUNTER — Ambulatory Visit: Payer: BLUE CROSS/BLUE SHIELD | Admitting: Family Medicine

## 2017-02-26 VITALS — BP 126/82 | HR 96 | Temp 98.2°F | Resp 16 | Ht 70.0 in | Wt 249.4 lb

## 2017-02-26 DIAGNOSIS — G479 Sleep disorder, unspecified: Secondary | ICD-10-CM | POA: Diagnosis not present

## 2017-02-26 DIAGNOSIS — R0600 Dyspnea, unspecified: Secondary | ICD-10-CM

## 2017-02-26 DIAGNOSIS — R609 Edema, unspecified: Secondary | ICD-10-CM

## 2017-02-26 DIAGNOSIS — M792 Neuralgia and neuritis, unspecified: Secondary | ICD-10-CM

## 2017-02-26 NOTE — Progress Notes (Signed)
Subjective:  By signing my name below, I, Daniel [name], attest that this documentation has been prepared under the direction and in the presence of [name], MD. Electronically Signed: [name], [place]. 02/26/2017 , 5:36 PM .  Patient was seen in Room 10 .   Patient ID: Daniel [name], male    DOB: 1955/11/07, 62 y.o.   MRN: [id number] Chief Complaint  Patient presents with  . Follow-up    patient presents for follow up on dizziness and edema   HPI Daniel [name] is a 62 y.o. male Patient has a history of multiple medical problems. Here for follow up of dizziness and peripheral neuropathy, as well as peripheral edema.   Dizziness See Nov 15th visit, improving at that time, thought related to medications with multiple psychotropics and sedating medications. He was also recommended fluid intake. Again, symptoms were improving.   He's off of his xanax, Adderall, and Ambien, but he's had some trouble sleeping. He also notes panicking but would eventually level out breathing with the cpap machine.   Peripheral edema He has a history of peripheral vascular disease with small vessel disease at his feet, followed by podiatry for foot ulcers. He had a normal pro-BNP in Oct. He had a slightly elevated d-dimer of 1.10 on Nov 15th. He had a bilateral lower extremity doppler on Nov 28th without DVT; no change since Nov 2nd. His creatinine was stable/improved in Nov at 1.3 and CPK was normal.   Wt Readings from Last 3 Encounters:  02/26/17 249 lb 6.4 oz (113.1 kg)  01/20/17 249 lb (112.9 kg)  01/13/17 252 lb (114.3 kg)   Diet: He states he hasn't changed his diet much since his last visit, still eating fast food. But, he's cut back on soda and sweet tea, some days without drinking any. He's drinking about the same amount of water as last visit.   Exercise: He was going to the [facility] on 10 machines, but then his schedule became messed up during the holidays. He plans to head back to  continue his exercise regime.    Peripheral neuropathy He has been currently going to outpatient rehab for balance, improving last visit with Lyrica 198m TID. Patient states his feet still burns with tingling. He's seen Dr. YKrista Bluein the past. His Vitamin B-12 level was normal at 528 in July 2018. He notes neuropathy worsens when he lays supine.    Dyspnea on exertion He was seen by cardiology in Nov 27th, did not think DOE was cardiovascular mediated. He was seen by Dr. WMelvyn Novas thought that obesity was a contributor. He also thought dyspnea was multi-factorial including deconditioning. Symptoms were disproportionate to objective findings. Spirometry on Nov 20th, FEV1 75%. He had no desaturation on 3 laps on room air. Ultimately, recommended reconditioning, weight loss, and pulmonary follow up as needed.   Patient Active Problem List   Diagnosis Date Noted  . Morbid obesity due to excess calories ([facility] complicated by DM / hyperlipidemia 01/15/2017  . DOE (dyspnea on exertion) 01/13/2017  . Confusion 09/17/2016  . Chronic migraine 09/17/2016  . Gait abnormality 09/17/2016  . Diabetic foot infection ([place] 06/28/2016  . Screening examination for venereal disease 02/11/2016  . Critical lower limb ischemia 12/07/2015  . Fall 12/05/2015  . Toe ulcer, right ([place] 09/19/2015  . Decreased pedal pulses 09/19/2015  . OSA on CPAP 09/05/2015  . Major depressive disorder, recurrent episode, moderate ([place] 09/05/2015  . OSA (obstructive sleep apnea) 07/25/2015  . Passed  out ([place]) 06/28/2015  . Low back pain 06/11/2015  . Abnormality of gait 06/11/2015  . Dizziness 03/17/2015  . Weakness 02/21/2015  . Chronic renal insufficiency, stage III (moderate) ([place]) 08/09/2014  . Diabetes ([place]) 11/07/2013  . Hematuria 06/21/2013  . Hepatic steatosis 09/09/2010  . UTI 09/15/2006  . PYURIA 09/03/2006  . Human immunodeficiency virus (HIV) disease ([place]) 06/04/2006  . HERPES ZOSTER, UNCOMPLICATED 20/35/5974  .  HSV 06/04/2006  . Depression 06/04/2006  . DISORDER, ATTENTION DEFICIT W/HYPERACTIVITY 06/04/2006  . THROMBOPHLEBITIS NOS 06/04/2006  . GERD 06/04/2006  . ARTHRITIS, HAND 06/04/2006  . HYPERGLYCEMIA, HX OF 06/04/2006  . HEPATITIS B, HX OF 06/04/2006   Past Medical History:  Diagnosis Date  . ADHD (attention deficit hyperactivity disorder)   . Anxiety   . Chronic kidney disease   . Clotting disorder ([place])   . Depression   . Diabetes mellitus without complication ([place])   . Diabetes mellitus, type II ([place])   . Dizziness 03/17/2015  . GERD (gastroesophageal reflux disease)   . HIV disease ([place])   . HIV infection ([place])   . Liver disease   . OSA (obstructive sleep apnea) 07/25/2015   Uses CPAP regularly  . Peripheral vascular disease ([place])   . Ulcer    Past Surgical History:  Procedure Laterality Date  . SMALL INTESTINE SURGERY    . STOMACH SURGERY    . TOE AMPUTATION Right 08/2016   right great toe   Allergies  Allergen Reactions  . Aspirin Swelling  . Ibuprofen Swelling  . Sustiva [Efavirenz] Swelling and Rash    And rash.  . Nsaids Other (See Comments)    unknwn   Prior to Admission medications   Medication Sig Start Date End Date Taking? Authorizing Provider  acetaminophen (TYLENOL) 500 MG tablet Take 1,000 mg by mouth every 8 (eight) hours as needed for moderate pain.    Yes [provider]  ALPRAZolam Duanne Moron) 1 MG tablet Take 1 mg by mouth 3 (three) times daily as needed for anxiety.    Yes [provider]  amphetamine-dextroamphetamine (ADDERALL) 30 MG tablet Take 30 mg by mouth 3 (three) times daily.    Yes [provider]  atorvastatin (LIPITOR) 10 MG tablet TAKE ONE TABLET BY MOUTH DAILY. 12/26/16  Yes [name], MD  diclofenac sodium (VOLTAREN) 1 % GEL APPLY 2 GRAMS TO EACH KNEE IN THE MORNING AND AT BEDTIME AND APPLY 1 GRAM TO EACH KNEE IN THE AFTERNOON. 02/22/17  Yes [name], MD  divalproex (DEPAKOTE ER) 500 MG 24 hr  tablet TAKE (1) TABLET BY MOUTH AT BEDTIME. 01/01/17  Yes Marcial Pacas, MD  insulin aspart (NOVOLOG FLEXPEN) 100 UNIT/ML FlexPen 3 times a day (just before each meal) 15-15-30 units 01/08/17  Yes Renato Shin, MD  Insulin Glargine (LANTUS SOLOSTAR) 100 UNIT/ML Solostar Pen Inject 70 Units every morning into the skin. 01/08/17  Yes Renato Shin, MD  levothyroxine (SYNTHROID, LEVOTHROID) 50 MCG tablet Take 1 tablet (50 mcg total) by mouth at bedtime. 07/24/15  Yes [name], MD  Multiple Vitamins-Minerals (ONE-A-DAY MENS 50+ ADVANTAGE PO) Take by mouth.   Yes [provider]  ondansetron (ZOFRAN-ODT) 4 MG disintegrating tablet Take 2 tablets (8 mg total) by mouth every 8 (eight) hours as needed for nausea or vomiting. 02/11/16  Yes Comer, Okey Regal, MD  pregabalin (LYRICA) 100 MG capsule Take 1 capsule (100 mg total) by mouth 3 (three) times daily. 12/11/16  Yes [name], MD  protriptyline (VIVACTIL) 10 MG tablet Take 10 mg by mouth 3 (three) times daily.  01/30/16  Yes [provider]  ranitidine (ZANTAC) 150 MG tablet Take 150 mg by mouth daily as needed.    Yes [provider]  REXULTI 1 MG TABS  11/10/16  Yes [provider]  SURE COMFORT PEN NEEDLES 31G X 8 MM MISC USE TO INJECT LANTUS AND [place]. 02/20/17  Yes Renato Shin, MD  terbinafine (LAMISIL) 250 MG tablet Take one tablet daily for 1 week out of each month 01/20/17  Yes Stover, Titorya, DPM  TRINTELLIX 20 MG TABS Take 20 mg by mouth at bedtime. 01/26/15  Yes [provider]  TRIUMEQ 600-50-300 MG tablet TAKE 1 TABLET BY MOUTH DAILY 01/09/17  Yes Comer, Okey Regal, MD  UNABLE TO FIND CPAP MACHINE with standard Aclaim nasal mask with humidifier. Set at 14 cwp Patient taking differently: CPAP MACHINE with standard Aclaim nasal mask with humidifier. Set at 4 cwp 04/12/13  Yes Daub, Loura Back, MD  XARELTO 15 MG TABS tablet TAKE ONE TABLET BY MOUTH DAILY. 01/23/17  Yes [name],  MD  zolpidem (AMBIEN) 10 MG tablet Take 10-15 mg at bedtime as needed by mouth for sleep.    Yes [provider]   Social History   Socioeconomic History  . Marital status: Single    Spouse name: Not on file  . Number of children: Not on file  . Years of education: Not on file  . Highest education level: Not on file  Social Needs  . Financial resource strain: Not on file  . Food insecurity - worry: Not on file  . Food insecurity - inability: Not on file  . Transportation needs - medical: Not on file  . Transportation needs - non-medical: Not on file  Occupational History  . Not on file  Tobacco Use  . Smoking status: Former Smoker    Packs/day: 0.10    Years: 10.00    Pack years: 1.00    Types: Cigars    Last attempt to quit: 08/09/2014    Years since quitting: 2.5  . Smokeless tobacco: Never Used  Substance and Sexual Activity  . Alcohol use: No    Alcohol/week: 0.0 oz  . Drug use: No  . Sexual activity: Not Currently    Partners: Male    Comment: pt. declined condoms  Other Topics Concern  . Not on file  Social History Narrative   Epworth Sleepiness Scale = 7 (as of 03/16/2015)   Review of Systems  Constitutional: Negative for fatigue and unexpected weight change.  Eyes: Negative for visual disturbance.  Respiratory: Negative for cough, chest tightness and shortness of breath.   Cardiovascular: Positive for leg swelling. Negative for chest pain and palpitations.  Gastrointestinal: Negative for abdominal pain and [name] in stool.  Neurological: Positive for dizziness. Negative for light-headedness and headaches.       Objective:   Physical Exam  Constitutional: He is oriented to person, place, and time. He appears well-developed and well-nourished.  HENT:  Head: Normocephalic and atraumatic.  Eyes: EOM are normal. Pupils are equal, round, and reactive to light.  Neck: No JVD present. Carotid bruit is not present.  Cardiovascular: Normal rate, regular  rhythm and normal heart sounds.  No murmur heard. Pulmonary/Chest: Effort normal and breath sounds normal. He has no rales.  Musculoskeletal: He exhibits no edema.  Trace pedal edema  Neurological: He is alert and oriented to person, place, and  time.  Skin: Skin is warm and dry.  Psychiatric: He has a normal mood and affect.  Vitals reviewed.   Vitals:   02/26/17 1648  BP: 126/82  Pulse: 96  Resp: 16  Temp: 98.2 F (36.8 C)  TempSrc: Oral  SpO2: 97%  Weight: 249 lb 6.4 oz (113.1 kg)  Height: 5' 10"  (1.778 m)      Assessment & Plan:  Daniel [name] is a 62 y.o. male Peripheral edema  - Improved at present. History of PVD, continue follow-up with podiatry, no new recommendations from recent cardiology evaluation  Peripheral neuropathic pain  -Improved at higher dose of Lyrica, still some symptoms. Recommended follow-up with neurology to determine if further dosing changes needed or change in therapy  Sleep difficulties  -May be combination of anxiety as well as certain sleep apnea. Recommend he coordinate with his psychiatrist as well as sleep specialist appropriate dosing of psychotropics and settings for his CPAP.  Dyspnea, unspecified type  -Prior workup with cardiology and pulmonary reviewed with possible multifactorial causes. Lungs clear at present, no signs of fluid overload. Activity as tolerated with possible component of deconditioning and watch diet for weight loss. ER/RTC precautions if any acute worsening of symptoms.  No orders of the defined types were placed in this encounter.  Patient Instructions    Returning to activity, low intensity exercise most days per week, and continue to try to drink more water, less tea or sodas. Good work on cutting back on those drinks.   OK to continue Lyrica 3 times per day.  If that is not helping your foot neuropathy, it may be best to discuss further changes with neurology. Please call [name] for appointment, but continue  same dose Lyrica for now.   For sleep issues - that may be best coordinated with your sleep specialist regarding the cpap and psychiatry to determine ideal medication doses for anxiety/sleep meds.  Follow up in next 3-6 months. Let me know if there are questions in the meantime.    IF you received an x-ray today, you will receive an invoice from [facility]. Please contact [facility] at [phone] with questions or concerns regarding your invoice.   IF you received labwork today, you will receive an invoice from [facility]. Please contact [facility] at [phone] with questions or concerns regarding your invoice.   Our billing staff will not be able to assist you with questions regarding bills from these companies.  You will be contacted with the lab results as soon as they are available. The fastest way to get your results is to activate your My Chart account. Instructions are located on the last page of this paperwork. If you have not heard from [name] regarding the results in 2 weeks, please contact this office.       I personally performed the services described in this documentation, which was scribed in my presence. The recorded information has been reviewed and considered for accuracy and completeness, addended by me as needed, and agree with information above.  Signed,   [name], MD [facility] [place].  02/27/17 3:04 PM

## 2017-02-26 NOTE — Patient Instructions (Addendum)
  Returning to activity, low intensity exercise most days per week, and continue to try to drink more water, less tea or sodas. Good work on cutting back on those drinks.   OK to continue Lyrica 3 times per day.  If that is not helping your foot neuropathy, it may be best to discuss further changes with neurology. Please call [name] for appointment, but continue same dose Lyrica for now.   For sleep issues - that may be best coordinated with your sleep specialist regarding the cpap and psychiatry to determine ideal medication doses for anxiety/sleep meds.  Follow up in next 3-6 months. Let me know if there are questions in the meantime.    IF you received an x-ray today, you will receive an invoice from [facility]. Please contact [facility] at [phone] with questions or concerns regarding your invoice.   IF you received labwork today, you will receive an invoice from [facility]. Please contact [facility] at [phone] with questions or concerns regarding your invoice.   Our billing staff will not be able to assist you with questions regarding bills from these companies.  You will be contacted with the lab results as soon as they are available. The fastest way to get your results is to activate your My Chart account. Instructions are located on the last page of this paperwork. If you have not heard from [name] regarding the results in 2 weeks, please contact this office.

## 2017-02-27 ENCOUNTER — Encounter: Payer: Self-pay | Admitting: Family Medicine

## 2017-03-03 ENCOUNTER — Encounter (HOSPITAL_COMMUNITY): Payer: BLUE CROSS/BLUE SHIELD

## 2017-03-05 ENCOUNTER — Encounter (HOSPITAL_COMMUNITY): Payer: BLUE CROSS/BLUE SHIELD

## 2017-03-10 ENCOUNTER — Encounter (HOSPITAL_COMMUNITY): Payer: BLUE CROSS/BLUE SHIELD

## 2017-03-10 ENCOUNTER — Ambulatory Visit: Payer: BLUE CROSS/BLUE SHIELD | Admitting: Endocrinology

## 2017-03-12 ENCOUNTER — Encounter (HOSPITAL_COMMUNITY): Payer: BLUE CROSS/BLUE SHIELD

## 2017-03-16 ENCOUNTER — Ambulatory Visit: Payer: BLUE CROSS/BLUE SHIELD | Admitting: Endocrinology

## 2017-03-17 ENCOUNTER — Ambulatory Visit: Payer: BLUE CROSS/BLUE SHIELD | Admitting: Sports Medicine

## 2017-03-18 ENCOUNTER — Other Ambulatory Visit: Payer: Self-pay | Admitting: Endocrinology

## 2017-03-18 ENCOUNTER — Encounter: Payer: Self-pay | Admitting: Endocrinology

## 2017-03-18 ENCOUNTER — Ambulatory Visit: Payer: BLUE CROSS/BLUE SHIELD | Admitting: Endocrinology

## 2017-03-18 VITALS — BP 138/80 | HR 86 | Wt 247.0 lb

## 2017-03-18 DIAGNOSIS — Z794 Long term (current) use of insulin: Secondary | ICD-10-CM

## 2017-03-18 DIAGNOSIS — E0849 Diabetes mellitus due to underlying condition with other diabetic neurological complication: Secondary | ICD-10-CM | POA: Diagnosis not present

## 2017-03-18 LAB — POCT GLYCOSYLATED HEMOGLOBIN (HGB A1C): HEMOGLOBIN A1C: 9.1

## 2017-03-18 MED ORDER — INSULIN GLARGINE 100 UNIT/ML SOLOSTAR PEN: 90.0000 [IU] | PEN_INJECTOR | SUBCUTANEOUS | 99 refills | Status: DC

## 2017-03-18 NOTE — Progress Notes (Signed)
Subjective:    Patient ID: Daniel [name], male    DOB: 1955/09/15, 62 y.o.   MRN: [id number]  HPI Pt returns for f/u of diabetes mellitus: DM type: Insulin-requiring type 2.  Dx'ed: 4097 Complications: polyneuropathy, PAD, renal insufficiency, and foot ulcers.  Therapy: insulin since 2016. DKA: never Severe hypoglycemia: never.  Pancreatitis: never.   Other: he takes multiple daily injections, but basal insulin is emphasized.   Interval history: no cbg record, but states cbg's vary from 200-300.  It is in general higher as the day goes on.  He says he never misses the insulin.  pt states he feels well in general.  He seldom misses the insulin.  He skips breakfast (and breakfast insulin) Past Medical History:  Diagnosis Date  . ADHD (attention deficit hyperactivity disorder)   . Anxiety   . Chronic kidney disease   . Clotting disorder ([place])   . Depression   . Diabetes mellitus without complication ([place])   . Diabetes mellitus, type II ([place])   . Dizziness 03/17/2015  . GERD (gastroesophageal reflux disease)   . HIV disease ([place])   . HIV infection ([place])   . Liver disease   . OSA (obstructive sleep apnea) 07/25/2015   Uses CPAP regularly  . Peripheral vascular disease ([place])   . Ulcer     Past Surgical History:  Procedure Laterality Date  . SMALL INTESTINE SURGERY    . STOMACH SURGERY    . TOE AMPUTATION Right 08/2016   right great toe    Social History   Socioeconomic History  . Marital status: Single    Spouse name: Not on file  . Number of children: Not on file  . Years of education: Not on file  . Highest education level: Not on file  Social Needs  . Financial resource strain: Not on file  . Food insecurity - worry: Not on file  . Food insecurity - inability: Not on file  . Transportation needs - medical: Not on file  . Transportation needs - non-medical: Not on file  Occupational History  . Not on file  Tobacco Use  . Smoking status: Former Smoker   Packs/day: 0.10    Years: 10.00    Pack years: 1.00    Types: Cigars    Last attempt to quit: 08/09/2014    Years since quitting: 2.6  . Smokeless tobacco: Never Used  Substance and Sexual Activity  . Alcohol use: No    Alcohol/week: 0.0 oz  . Drug use: No  . Sexual activity: Not Currently    Partners: Male    Comment: pt. declined condoms  Other Topics Concern  . Not on file  Social History Narrative   Epworth Sleepiness Scale = 7 (as of 03/16/2015)    Current Outpatient Medications on File Prior to Visit  Medication Sig Dispense Refill  . acetaminophen (TYLENOL) 500 MG tablet Take 1,000 mg by mouth every 8 (eight) hours as needed for moderate pain.     [name] ALPRAZolam (XANAX) 1 MG tablet Take 1 mg by mouth 3 (three) times daily as needed for anxiety.     [name] amphetamine-dextroamphetamine (ADDERALL) 30 MG tablet Take 30 mg by mouth 3 (three) times daily.     [name] atorvastatin (LIPITOR) 10 MG tablet TAKE ONE TABLET BY MOUTH DAILY. 90 tablet 0  . diclofenac sodium (VOLTAREN) 1 % GEL APPLY 2 GRAMS TO EACH KNEE IN THE MORNING AND AT BEDTIME AND APPLY 1 GRAM TO EACH KNEE IN  THE AFTERNOON. 300 g 0  . divalproex (DEPAKOTE ER) 500 MG 24 hr tablet TAKE (1) TABLET BY MOUTH AT BEDTIME. 90 tablet 2  . insulin aspart (NOVOLOG FLEXPEN) 100 UNIT/ML FlexPen 3 times a day (just before each meal) 15-15-30 units 30 mL 11  . levothyroxine (SYNTHROID, LEVOTHROID) 50 MCG tablet Take 1 tablet (50 mcg total) by mouth at bedtime. 30 tablet 11  . Multiple Vitamins-Minerals (ONE-A-DAY MENS 50+ ADVANTAGE PO) Take by mouth.    . ondansetron (ZOFRAN-ODT) 4 MG disintegrating tablet Take 2 tablets (8 mg total) by mouth every 8 (eight) hours as needed for nausea or vomiting. 60 tablet 5  . pregabalin (LYRICA) 100 MG capsule Take 1 capsule (100 mg total) by mouth 3 (three) times daily. 90 capsule 3  . protriptyline (VIVACTIL) 10 MG tablet Take 10 mg by mouth 3 (three) times daily.   11  . ranitidine (ZANTAC) 150 MG tablet  Take 150 mg by mouth daily as needed.     [name] REXULTI 1 MG TABS   98  . terbinafine (LAMISIL) 250 MG tablet Take one tablet daily for 1 week out of each month 42 tablet 0  . TRINTELLIX 20 MG TABS Take 20 mg by mouth at bedtime.    . TRIUMEQ 600-50-300 MG tablet TAKE 1 TABLET BY MOUTH DAILY 30 tablet 6  . UNABLE TO FIND CPAP MACHINE with standard Aclaim nasal mask with humidifier. Set at 14 cwp (Patient taking differently: CPAP MACHINE with standard Aclaim nasal mask with humidifier. Set at 4 cwp) 1 each 0  . XARELTO 15 MG TABS tablet TAKE ONE TABLET BY MOUTH DAILY. 30 tablet 3  . zolpidem (AMBIEN) 10 MG tablet Take 10-15 mg at bedtime as needed by mouth for sleep.      No current facility-administered medications on file prior to visit.     Allergies  Allergen Reactions  . Aspirin Swelling  . Ibuprofen Swelling  . Sustiva [Efavirenz] Swelling and Rash    And rash.  . Nsaids Other (See Comments)    unknwn    Family History  Problem Relation Age of Onset  . Depression Brother   . Throat cancer Brother        half brother, never smoker  . COPD Mother   . Diabetes Neg Hx     BP 138/80 (BP Location: Left Arm, Patient Position: Sitting, Cuff Size: Normal)   Pulse 86   Wt 247 lb (112 kg)   SpO2 95%   BMI 35.44 kg/m    Review of Systems Denies hypoglycemia.      Objective:   Physical Exam VITAL SIGNS:  See vs page.  GENERAL: no distress. Pulses: foot pulses are intact bilaterally.   MSK: no deformity of the feet or ankles, except the right great toe is absent.  CV: 1+ bilat edema of the legs  Skin: Right foot ulcer is healed.  normal color and temp on the feet and ankles Neuro: sensation is intact to touch on the feet and ankles, but decreased from normal. There is bilateral onychomycosis of the toenails.   Lab Results  Component Value Date   CREATININE 1.30 (H) 01/08/2017   BUN 18 01/08/2017   NA 137 01/08/2017   K 4.3 01/08/2017   CL 100 01/08/2017   CO2 22  01/08/2017    Lab Results  Component Value Date   HGBA1C 9.1 03/18/2017       Assessment & Plan:  Insulin-requiring type 2 DM, with PAD:  worse Noncompliance with cbg recording: this compromises the rx of DM Renal insuff: in this setting, he prob needs more mealtime insulin.  This is supported by the pattern of reported cbg's, but he has not done well with multiple daily injections, so we'll continue to emphasize basal  Patient Instructions  check your blood sugar twice a day.  vary the time of day when you check, between before the 3 meals, and at bedtime.  also check if you have symptoms of your blood sugar being too high or too low.  please keep a record of the readings and bring it to your next appointment here (or you can bring the meter itself).  You can write it on any piece of paper.  please call us sooner if your blood sugar goes below 70, or if you have a lot of readings over 200.   For now, please:  increase the lantus to 90 units each morning, and:  Please continue the same novolog: 3 times a day (just before each meal) 15-15-30 units.  Please come back for a follow-up appointment in 2 months.

## 2017-03-18 NOTE — Patient Instructions (Signed)
check your blood sugar twice a day.  vary the time of day when you check, between before the 3 meals, and at bedtime.  also check if you have symptoms of your blood sugar being too high or too low.  please keep a record of the readings and bring it to your next appointment here (or you can bring the meter itself).  You can write it on any piece of paper.  please call us sooner if your blood sugar goes below 70, or if you have a lot of readings over 200.   For now, please:  increase the lantus to 90 units each morning, and:  Please continue the same novolog: 3 times a day (just before each meal) 15-15-30 units.  Please come back for a follow-up appointment in 2 months.

## 2017-03-26 ENCOUNTER — Other Ambulatory Visit: Payer: Self-pay | Admitting: Family Medicine

## 2017-03-31 ENCOUNTER — Encounter: Payer: Self-pay | Admitting: Family Medicine

## 2017-03-31 ENCOUNTER — Ambulatory Visit: Payer: BLUE CROSS/BLUE SHIELD | Admitting: Family Medicine

## 2017-03-31 VITALS — BP 146/81 | HR 105 | Temp 98.5°F | Resp 16 | Ht 71.5 in | Wt 248.2 lb

## 2017-03-31 DIAGNOSIS — M545 Low back pain, unspecified: Secondary | ICD-10-CM

## 2017-03-31 MED ORDER — CYCLOBENZAPRINE HCL 5 MG PO TABS: 2.5000 mg | ORAL_TABLET | Freq: Every evening | ORAL | 0 refills | Status: DC | PRN

## 2017-03-31 NOTE — Progress Notes (Signed)
By signing my name below, I, Daniel [name], attest that this documentation has been prepared under the direction and in the presence of Daniel Raspberry Ranell Patrick, MD.  Electronically Signed: Theresia [name], Medical Scribe 03/31/17 at 2:36 PM Subjective:    Patient ID: Daniel [name], male    DOB: 12-Nov-1955, 62 y.o.   MRN: [id number] Chief Complaint  Patient presents with  . Back Pain    HPI [name] is a 62 y.o. male who presents to Primary Care at [facility] complaining of back pain. He has a history of multiple medical problems including obesity, Type 2 DM, peripheral vascular disease, CKD, and HIV.   He had an MRI in April 2017 with L4-5 moderate facet hypertrophy, L5-S1 small midline disk protrusion with mild facet hypertrophy, mild right minimal left foraminal narrowing without apparent nerve root impingement.   He reports that his back pain is intermittent and located central to right side onset 2 weeks ago. No hx of fall or injury. He notes that the pain sometimes radiates up his spine. He states his pain is worse with lateral flexion (left greater than right) and is improved with rest. He has tried tried heat, ice, Tylenol, and applying voltaren gel over the area. He reports that he doe stake Ambien at night. No new changes in his sleep. No new mattress. No symptoms consistent with kidney stones. Pt denies night sweats, hematuria, dysuria, urinary frequency, urinary or bowel incontinence, fever, saddle anesthesia, leg radiation, leg weakness    Patient Active Problem List   Diagnosis Date Noted  . Morbid obesity due to excess calories ([place]) complicated by DM / hyperlipidemia 01/15/2017  . DOE (dyspnea on exertion) 01/13/2017  . Confusion 09/17/2016  . Chronic migraine 09/17/2016  . Gait abnormality 09/17/2016  . Diabetic foot infection ([place]) 06/28/2016  . Screening examination for venereal disease 02/11/2016  . Critical lower limb ischemia 12/07/2015  . Fall 12/05/2015  . Toe  ulcer, right ([place]) 09/19/2015  . Decreased pedal pulses 09/19/2015  . OSA on CPAP 09/05/2015  . Major depressive disorder, recurrent episode, moderate ([place]) 09/05/2015  . OSA (obstructive sleep apnea) 07/25/2015  . Passed out [facility]) 06/28/2015  . Low back pain 06/11/2015  . Abnormality of gait 06/11/2015  . Dizziness 03/17/2015  . Weakness 02/21/2015  . Chronic renal insufficiency, stage III (moderate) ([place]) 08/09/2014  . Diabetes ([place]) 11/07/2013  . Hematuria 06/21/2013  . Hepatic steatosis 09/09/2010  . UTI 09/15/2006  . PYURIA 09/03/2006  . Human immunodeficiency virus (HIV) disease ([place]) 06/04/2006  . HERPES ZOSTER, UNCOMPLICATED 50/04/7046  . HSV 06/04/2006  . Depression 06/04/2006  . DISORDER, ATTENTION DEFICIT W/HYPERACTIVITY 06/04/2006  . THROMBOPHLEBITIS NOS 06/04/2006  . GERD 06/04/2006  . ARTHRITIS, HAND 06/04/2006  . HYPERGLYCEMIA, HX OF 06/04/2006  . HEPATITIS B, HX OF 06/04/2006   Past Medical History:  Diagnosis Date  . ADHD (attention deficit hyperactivity disorder)   . Anxiety   . Chronic kidney disease   . Clotting disorder ([place])   . Depression   . Diabetes mellitus without complication ([place])   . Diabetes mellitus, type II ([place])   . Dizziness 03/17/2015  . GERD (gastroesophageal reflux disease)   . HIV disease ([place])   . HIV infection ([place])   . Liver disease   . OSA (obstructive sleep apnea) 07/25/2015   Uses CPAP regularly  . Peripheral vascular disease ([place])   . Ulcer    Past Surgical History:  Procedure Laterality Date  . SMALL INTESTINE SURGERY    .  STOMACH SURGERY    . TOE AMPUTATION Right 08/2016   right great toe   Allergies  Allergen Reactions  . Aspirin Swelling  . Ibuprofen Swelling  . Sustiva [Efavirenz] Swelling and Rash    And rash.  . Nsaids Other (See Comments)    unknwn   Prior to Admission medications   Medication Sig Start Date End Date Taking? Authorizing Provider  acetaminophen (TYLENOL) 500 MG tablet Take 1,000 mg by  mouth every 8 (eight) hours as needed for moderate pain.     [provider]  ALPRAZolam Duanne Moron) 1 MG tablet Take 1 mg by mouth 3 (three) times daily as needed for anxiety.     [provider]  amphetamine-dextroamphetamine (ADDERALL) 30 MG tablet Take 30 mg by mouth 3 (three) times daily.     [provider]  atorvastatin (LIPITOR) 10 MG tablet TAKE ONE TABLET BY MOUTH DAILY. 03/26/17   [name], MD  diclofenac sodium (VOLTAREN) 1 % GEL APPLY 2 GRAMS TO EACH KNEE IN THE MORNING AND AT BEDTIME AND APPLY 1 GRAM TO EACH KNEE IN THE AFTERNOON. 02/22/17   [name], MD  divalproex (DEPAKOTE ER) 500 MG 24 hr tablet TAKE (1) TABLET BY MOUTH AT BEDTIME. 01/01/17   Marcial Pacas, MD  insulin aspart (NOVOLOG FLEXPEN) 100 UNIT/ML FlexPen 3 times a day (just before each meal) 15-15-30 units 01/08/17   Renato Shin, MD  Insulin Glargine (LANTUS SOLOSTAR) 100 UNIT/ML Solostar Pen Inject 90 Units into the skin every morning. 03/18/17   Renato Shin, MD  levothyroxine (SYNTHROID, LEVOTHROID) 50 MCG tablet Take 1 tablet (50 mcg total) by mouth at bedtime. 07/24/15   [name], MD  Multiple Vitamins-Minerals (ONE-A-DAY MENS 50+ ADVANTAGE PO) Take by mouth.    [provider]  ondansetron (ZOFRAN-ODT) 4 MG disintegrating tablet Take 2 tablets (8 mg total) by mouth every 8 (eight) hours as needed for nausea or vomiting. 02/11/16   Comer, Okey Regal, MD  pregabalin (LYRICA) 100 MG capsule Take 1 capsule (100 mg total) by mouth 3 (three) times daily. 12/11/16   [name], MD  protriptyline (VIVACTIL) 10 MG tablet Take 10 mg by mouth 3 (three) times daily.  01/30/16   [provider]  ranitidine (ZANTAC) 150 MG tablet Take 150 mg by mouth daily as needed.     [provider]  REXULTI 1 MG TABS  11/10/16   [provider]  SURE COMFORT PEN NEEDLES 31G X 8 MM MISC USE TO INJECT LANTUS AND NOVOLOG DAILY. 03/18/17   Renato Shin, MD    terbinafine (LAMISIL) 250 MG tablet Take one tablet daily for 1 week out of each month 01/20/17   Stover, Titorya, DPM  TRINTELLIX 20 MG TABS Take 20 mg by mouth at bedtime. 01/26/15   [provider]  TRIUMEQ 600-50-300 MG tablet TAKE 1 TABLET BY MOUTH DAILY 01/09/17   Comer, Okey Regal, MD  UNABLE TO FIND CPAP MACHINE with standard Aclaim nasal mask with humidifier. Set at 14 cwp Patient taking differently: CPAP MACHINE with standard Aclaim nasal mask with humidifier. Set at 4 cwp 04/12/13   Daub, Loura Back, MD  XARELTO 15 MG TABS tablet TAKE ONE TABLET BY MOUTH DAILY. 01/23/17   [name], MD  zolpidem (AMBIEN) 10 MG tablet Take 10-15 mg at bedtime as needed by mouth for sleep.     [provider]   Social History   Socioeconomic History  . Marital status:  Single    Spouse name: Not on file  . Number of children: Not on file  . Years of education: Not on file  . Highest education level: Not on file  Social Needs  . Financial resource strain: Not on file  . Food insecurity - worry: Not on file  . Food insecurity - inability: Not on file  . Transportation needs - medical: Not on file  . Transportation needs - non-medical: Not on file  Occupational History  . Not on file  Tobacco Use  . Smoking status: Former Smoker    Packs/day: 0.10    Years: 10.00    Pack years: 1.00    Types: Cigars    Last attempt to quit: 08/09/2014    Years since quitting: 2.6  . Smokeless tobacco: Never Used  Substance and Sexual Activity  . Alcohol use: No    Alcohol/week: 0.0 oz  . Drug use: No  . Sexual activity: Not Currently    Partners: Male    Comment: pt. declined condoms  Other Topics Concern  . Not on file  Social History Narrative   Epworth Sleepiness Scale = 7 (as of 03/16/2015)      Review of Systems  Constitutional: Negative for diaphoresis, fever and unexpected weight change.  Endocrine: Negative for polyuria.  Genitourinary: Negative for difficulty  urinating, dysuria, flank pain, hematuria and urgency.  Musculoskeletal: Positive for back pain.  Neurological: Negative for weakness and numbness.       Objective:   Physical Exam  Constitutional: He is oriented to person, place, and time. No distress.  HENT:  Head: Normocephalic and atraumatic.  Eyes: Conjunctivae and EOM are normal. Pupils are equal, round, and reactive to light. No scleral icterus.  Neck: Neck supple. No thyromegaly present.  Cardiovascular: Normal rate.  Pulmonary/Chest: Effort normal. No respiratory distress.  Musculoskeletal: He exhibits no tenderness.  Lumbar spine: No midline bony tenderness.Possible minimal spasm of right paraspinal muscle. Negative seated straight leg raise. Strength is intact in lower extremities. Flexion to 90 degrees with full extension.   Neurological: He is alert and oriented to person, place, and time.  Skin: Skin is warm and dry. No rash noted. He is not diaphoretic. No erythema.    Slight discomfort with left greater than right lateral flexion.    Vitals:   03/31/17 1335  BP: (!) 146/81  Pulse: (!) 105  Resp: 16  Temp: 98.5 F (36.9 C)  TempSrc: Oral  SpO2: 96%  Weight: 248 lb 3.2 oz (112.6 kg)  Height: 5' 11.5" (1.816 m)       Assessment & Plan:   Daniel [name] is a 62 y.o. male Acute right-sided low back pain without sciatica - Plan: cyclobenzaprine (FLEXERIL) 5 MG tablet  - Suspected muscular cause versus degenerative disc disease, prior facet issues. No red flags on exam or history, no known injury, imaging deferred at present.   -Symptomatic care with Tylenol, range of motion, stretches, topical Biofreeze or other topical treatment okay, and low-dose Flexeril only if needed at night. Side effects and precautions were discussed including not combine with other sedating medication.  -Recheck in 2 weeks if not improving, sooner if worse for possible repeat imaging.  Meds ordered this encounter  Medications  .  cyclobenzaprine (FLEXERIL) 5 MG tablet    Sig: Take 0.5-1 tablets (2.5-5 mg total) by mouth at bedtime as needed.    Dispense:  10 tablet    Refill:  0   Patient Instructions  Back pain appears to be muscular at this time. Okay to continue Tylenol, topical muscle spray such as Biofreeze if needed, gentle range of motion and stretches. I did write for a few muscle relaxants to take at bedtime, but do not combine those with other sedating medicines and be careful with falling and dizziness while taking that medication. If you're not improving in the next 2 weeks, or worsening sooner, return for possible x-rays or other testing.  Return to the clinic or go to the nearest emergency room if any of your symptoms worsen or new symptoms occur.  Back Pain, Adult Many adults have back pain from time to time. Common causes of back pain include:  A strained muscle or ligament.  Wear and tear (degeneration) of the spinal disks.  Arthritis.  A hit to the back.  Back pain can be short-lived (acute) or last a long time (chronic). A physical exam, lab tests, and imaging studies may be done to find the cause of your pain. Follow these instructions at home: Managing pain and stiffness  Take over-the-counter and prescription medicines only as told by your health care provider.  If directed, apply heat to the affected area as often as told by your health care provider. Use the heat source that your health care provider recommends, such as a moist heat pack or a heating pad. ? Place a towel between your skin and the heat source. ? Leave the heat on for 20-30 minutes. ? Remove the heat if your skin turns bright red. This is especially important if you are unable to feel pain, heat, or cold. You have a greater risk of getting burned.  If directed, apply ice to the injured area: ? Put ice in a plastic bag. ? Place a towel between your skin and the bag. ? Leave the ice on for 20 minutes, 2-3 times a day  for the first 2-3 days. Activity  Do not stay in bed. Resting more than 1-2 days can delay your recovery.  Take short walks on even surfaces as soon as you are able. Try to increase the length of time you walk each day.  Do not sit, drive, or stand in one place for more than 30 minutes at a time. Sitting or standing for long periods of time can put stress on your back.  Use proper lifting techniques. When you bend and lift, use positions that put less stress on your back: ? [place] your knees. ? Keep the load close to your body. ? Avoid twisting.  Exercise regularly as told by your health care provider. Exercising will help your back heal faster. This also helps prevent back injuries by keeping muscles strong and flexible.  Your health care provider may recommend that you see a physical therapist. This person can help you come up with a safe exercise program. Do any exercises as told by your physical therapist. Lifestyle  Maintain a healthy weight. Extra weight puts stress on your back and makes it difficult to have good posture.  Avoid activities or situations that make you feel anxious or stressed. Learn ways to manage anxiety and stress. One way to manage stress is through exercise. Stress and anxiety increase muscle tension and can make back pain worse. General instructions  Sleep on a firm mattress in a comfortable position. Try lying on your side with your knees slightly bent. If you lie on your back, put a pillow under your knees.  Follow your treatment plan as told by  your health care provider. This may include: ? Cognitive or behavioral therapy. ? Acupuncture or massage therapy. ? Meditation or yoga. Contact a health care provider if:  You have pain that is not relieved with rest or medicine.  You have increasing pain going down into your legs or buttocks.  Your pain does not improve in 2 weeks.  You have pain at night.  You lose weight.  You have a fever or  chills. Get help right away if:  You develop new bowel or bladder control problems.  You have unusual weakness or numbness in your arms or legs.  You develop nausea or vomiting.  You develop abdominal pain.  You feel faint. Summary  Many adults have back pain from time to time. A physical exam, lab tests, and imaging studies may be done to find the cause of your pain.  Use proper lifting techniques. When you bend and lift, use positions that put less stress on your back.  Take over-the-counter and prescription medicines and apply heat or ice as directed by your health care provider. This information is not intended to replace advice given to you by your health care provider. Make sure you discuss any questions you have with your health care provider. Document Released: 02/10/2005 Document Revised: 03/17/2016 Document Reviewed: 03/17/2016 Elsevier Interactive Patient Education  2018 [employer].    IF you received an x-ray today, you will receive an invoice from [facility]. Please contact [facility] at [phone] with questions or concerns regarding your invoice.   IF you received labwork today, you will receive an invoice from [place] [facility]. Please contact [facility] at [phone] with questions or concerns regarding your invoice.   Our billing staff will not be able to assist you with questions regarding bills from these companies.  You will be contacted with the lab results as soon as they are available. The fastest way to get your results is to activate your My Chart account. Instructions are located on the last page of this paperwork. If you have not heard from [name] regarding the results in 2 weeks, please contact this office.      I personally performed the services described in this documentation, which was scribed in my presence. The recorded information has been reviewed and considered for accuracy and completeness, addended by me as needed, and agree with  information above.  Signed,   [name], MD Primary Care at [place].  03/31/17 2:43 PM

## 2017-03-31 NOTE — Patient Instructions (Addendum)
Back pain appears to be muscular at this time. Okay to continue Tylenol, topical muscle spray such as Biofreeze if needed, gentle range of motion and stretches. I did write for a few muscle relaxants to take at bedtime, but do not combine those with other sedating medicines and be careful with falling and dizziness while taking that medication. If you're not improving in the next 2 weeks, or worsening sooner, return for possible x-rays or other testing.  Return to the clinic or go to the nearest emergency room if any of your symptoms worsen or new symptoms occur.  Back Pain, Adult Many adults have back pain from time to time. Common causes of back pain include:  A strained muscle or ligament.  Wear and tear (degeneration) of the spinal disks.  Arthritis.  A hit to the back.  Back pain can be short-lived (acute) or last a long time (chronic). A physical exam, lab tests, and imaging studies may be done to find the cause of your pain. Follow these instructions at home: Managing pain and stiffness  Take over-the-counter and prescription medicines only as told by your health care provider.  If directed, apply heat to the affected area as often as told by your health care provider. Use the heat source that your health care provider recommends, such as a moist heat pack or a heating pad. ? Place a towel between your skin and the heat source. ? Leave the heat on for 20-30 minutes. ? Remove the heat if your skin turns bright red. This is especially important if you are unable to feel pain, heat, or cold. You have a greater risk of getting burned.  If directed, apply ice to the injured area: ? Put ice in a plastic bag. ? Place a towel between your skin and the bag. ? Leave the ice on for 20 minutes, 2-3 times a day for the first 2-3 days. Activity  Do not stay in bed. Resting more than 1-2 days can delay your recovery.  Take short walks on even surfaces as soon as you are able. Try to  increase the length of time you walk each day.  Do not sit, drive, or stand in one place for more than 30 minutes at a time. Sitting or standing for long periods of time can put stress on your back.  Use proper lifting techniques. When you bend and lift, use positions that put less stress on your back: ? [place] your knees. ? Keep the load close to your body. ? Avoid twisting.  Exercise regularly as told by your health care provider. Exercising will help your back heal faster. This also helps prevent back injuries by keeping muscles strong and flexible.  Your health care provider may recommend that you see a physical therapist. This person can help you come up with a safe exercise program. Do any exercises as told by your physical therapist. Lifestyle  Maintain a healthy weight. Extra weight puts stress on your back and makes it difficult to have good posture.  Avoid activities or situations that make you feel anxious or stressed. Learn ways to manage anxiety and stress. One way to manage stress is through exercise. Stress and anxiety increase muscle tension and can make back pain worse. General instructions  Sleep on a firm mattress in a comfortable position. Try lying on your side with your knees slightly bent. If you lie on your back, put a pillow under your knees.  Follow your treatment plan as told by  your health care provider. This may include: ? Cognitive or behavioral therapy. ? Acupuncture or massage therapy. ? Meditation or yoga. Contact a health care provider if:  You have pain that is not relieved with rest or medicine.  You have increasing pain going down into your legs or buttocks.  Your pain does not improve in 2 weeks.  You have pain at night.  You lose weight.  You have a fever or chills. Get help right away if:  You develop new bowel or bladder control problems.  You have unusual weakness or numbness in your arms or legs.  You develop nausea or  vomiting.  You develop abdominal pain.  You feel faint. Summary  Many adults have back pain from time to time. A physical exam, lab tests, and imaging studies may be done to find the cause of your pain.  Use proper lifting techniques. When you bend and lift, use positions that put less stress on your back.  Take over-the-counter and prescription medicines and apply heat or ice as directed by your health care provider. This information is not intended to replace advice given to you by your health care provider. Make sure you discuss any questions you have with your health care provider. Document Released: 02/10/2005 Document Revised: 03/17/2016 Document Reviewed: 03/17/2016 Elsevier Interactive Patient Education  2018 [employer].    IF you received an x-ray today, you will receive an invoice from [facility]. Please contact [facility] at [phone] with questions or concerns regarding your invoice.   IF you received labwork today, you will receive an invoice from [place]. Please contact [facility] at [phone] with questions or concerns regarding your invoice.   Our billing staff will not be able to assist you with questions regarding bills from these companies.  You will be contacted with the lab results as soon as they are available. The fastest way to get your results is to activate your My Chart account. Instructions are located on the last page of this paperwork. If you have not heard from [name] regarding the results in 2 weeks, please contact this office.

## 2017-04-10 ENCOUNTER — Encounter: Payer: Self-pay | Admitting: Family Medicine

## 2017-04-10 DIAGNOSIS — M545 Low back pain, unspecified: Secondary | ICD-10-CM

## 2017-04-11 MED ORDER — CYCLOBENZAPRINE HCL 5 MG PO TABS: 2.5000 mg | ORAL_TABLET | Freq: Every evening | ORAL | 0 refills | Status: DC | PRN

## 2017-04-14 ENCOUNTER — Ambulatory Visit: Payer: BLUE CROSS/BLUE SHIELD | Admitting: Family Medicine

## 2017-04-16 ENCOUNTER — Other Ambulatory Visit: Payer: Self-pay

## 2017-04-16 ENCOUNTER — Encounter: Payer: Self-pay | Admitting: Family Medicine

## 2017-04-16 ENCOUNTER — Ambulatory Visit: Payer: BLUE CROSS/BLUE SHIELD | Admitting: Family Medicine

## 2017-04-16 VITALS — BP 133/84 | HR 98 | Temp 98.1°F | Resp 16 | Ht 71.0 in | Wt 249.0 lb

## 2017-04-16 DIAGNOSIS — K143 Hypertrophy of tongue papillae: Secondary | ICD-10-CM | POA: Diagnosis not present

## 2017-04-16 DIAGNOSIS — R591 Generalized enlarged lymph nodes: Secondary | ICD-10-CM | POA: Diagnosis not present

## 2017-04-16 DIAGNOSIS — J029 Acute pharyngitis, unspecified: Secondary | ICD-10-CM

## 2017-04-16 DIAGNOSIS — S40812A Abrasion of left upper arm, initial encounter: Secondary | ICD-10-CM

## 2017-04-16 DIAGNOSIS — W540XXA Bitten by dog, initial encounter: Secondary | ICD-10-CM | POA: Diagnosis not present

## 2017-04-16 LAB — POCT RAPID STREP A (OFFICE): Rapid Strep A Screen: NEGATIVE

## 2017-04-16 LAB — POCT SKIN KOH: Skin KOH, POC: NEGATIVE

## 2017-04-16 MED ORDER — AMOXICILLIN-POT CLAVULANATE 875-125 MG PO TABS: 1.0000 | ORAL_TABLET | Freq: Two times a day (BID) | ORAL | 0 refills | Status: DC

## 2017-04-16 NOTE — Progress Notes (Signed)
Subjective:  By signing my name below, I, Daniel [name], attest that this documentation has been prepared under the direction and in the presence of Daniel Raspberry Ranell Patrick, MD.  Electronically Signed: Theresia [name], Medical Scribe 04/16/17 at 6:27 PM  Patient was seen in Room 12   Patient ID: Daniel [name], male    DOB: August 19, 1955, 62 y.o.   MRN: [id number] Chief Complaint  Patient presents with  . Sore Throat    patient presents to office c/o sore throat and swollen glands x 2 days. Patient reports difficulty swallowing yesterday, used OTC cough drop with some relief    HPI [name] is a 62 y.o. male who presents to Primary Care at [facility] complaining of a sore throat. He has a history of multiple medical problems including obesity, Type 2 DM, peripheral vascular disease, CKD, and HIV.   Sore throat Onset 2 days ago with tongue soreness. He reports swollen glands that is primarily on the left side. He has tried OTC cough drop with some relief. He reports no contact with similar symptoms. Per pt, he is also experiencing symptoms of thrush which he as experienced in the past. He denies fever, chills, cough, body aches or any other complaints at this time.  Abrasions and puncture wounds He also notes that he recently got a puppy and he has several abrasions and puncture wounds on his left arm from the dog teething. He reports that he is cleaning the wounds with soap and water.   HIV His next appointment with [name] is in March. History of CD4 count 910 in 11/2016 and undetectable HIV RNA.   Back pain Unresolved from last visit.     Patient Active Problem List   Diagnosis Date Noted  . Morbid obesity due to excess calories ([place]) complicated by DM / hyperlipidemia 01/15/2017  . DOE (dyspnea on exertion) 01/13/2017  . Confusion 09/17/2016  . Chronic migraine 09/17/2016  . Gait abnormality 09/17/2016  . Diabetic foot infection ([place]) 06/28/2016  . Screening examination for  venereal disease 02/11/2016  . Critical lower limb ischemia 12/07/2015  . Fall 12/05/2015  . Toe ulcer, right ([place]) 09/19/2015  . Decreased pedal pulses 09/19/2015  . OSA on CPAP 09/05/2015  . Major depressive disorder, recurrent episode, moderate ([place]) 09/05/2015  . OSA (obstructive sleep apnea) 07/25/2015  . Passed out [facility]) 06/28/2015  . Low back pain 06/11/2015  . Abnormality of gait 06/11/2015  . Dizziness 03/17/2015  . Weakness 02/21/2015  . Chronic renal insufficiency, stage III (moderate) ([place]) 08/09/2014  . Diabetes ([place]) 11/07/2013  . Hematuria 06/21/2013  . Hepatic steatosis 09/09/2010  . UTI 09/15/2006  . PYURIA 09/03/2006  . Human immunodeficiency virus (HIV) disease ([place]) 06/04/2006  . HERPES ZOSTER, UNCOMPLICATED 99/77/4142  . HSV 06/04/2006  . Depression 06/04/2006  . DISORDER, ATTENTION DEFICIT W/HYPERACTIVITY 06/04/2006  . THROMBOPHLEBITIS NOS 06/04/2006  . GERD 06/04/2006  . ARTHRITIS, HAND 06/04/2006  . HYPERGLYCEMIA, HX OF 06/04/2006  . HEPATITIS B, HX OF 06/04/2006   Past Medical History:  Diagnosis Date  . ADHD (attention deficit hyperactivity disorder)   . Anxiety   . Chronic kidney disease   . Clotting disorder ([place])   . Depression   . Diabetes mellitus without complication ([place])   . Diabetes mellitus, type II ([place])   . Dizziness 03/17/2015  . GERD (gastroesophageal reflux disease)   . HIV disease ([place])   . HIV infection ([place])   . Liver disease   . OSA (obstructive  sleep apnea) 07/25/2015   Uses CPAP regularly  . Peripheral vascular disease ([place])   . Ulcer    Past Surgical History:  Procedure Laterality Date  . SMALL INTESTINE SURGERY    . STOMACH SURGERY    . TOE AMPUTATION Right 08/2016   right great toe   Allergies  Allergen Reactions  . Aspirin Swelling  . Ibuprofen Swelling  . Sustiva [Efavirenz] Swelling and Rash    And rash.  . Nsaids Other (See Comments)    unknwn   Prior to Admission medications   Medication Sig Start  Date End Date Taking? Authorizing Provider  acetaminophen (TYLENOL) 500 MG tablet Take 1,000 mg by mouth every 8 (eight) hours as needed for moderate pain.    Yes [provider]  ALPRAZolam Duanne Moron) 1 MG tablet Take 1 mg by mouth 2 (two) times daily.    Yes [provider]  amphetamine-dextroamphetamine (ADDERALL) 30 MG tablet Take 30 mg by mouth 2 (two) times daily.    Yes [provider]  atorvastatin (LIPITOR) 10 MG tablet TAKE ONE TABLET BY MOUTH DAILY. 03/26/17  Yes [name], MD  cyclobenzaprine (FLEXERIL) 5 MG tablet Take 0.5-1 tablets (2.5-5 mg total) by mouth at bedtime as needed. 04/11/17  Yes [name], MD  diclofenac sodium (VOLTAREN) 1 % GEL APPLY 2 GRAMS TO EACH KNEE IN THE MORNING AND AT BEDTIME AND APPLY 1 GRAM TO EACH KNEE IN THE AFTERNOON. 02/22/17  Yes [name], MD  divalproex (DEPAKOTE ER) 500 MG 24 hr tablet TAKE (1) TABLET BY MOUTH AT BEDTIME. 01/01/17  Yes Marcial Pacas, MD  insulin aspart (NOVOLOG FLEXPEN) 100 UNIT/ML FlexPen 3 times a day (just before each meal) 15-15-30 units 01/08/17  Yes Renato Shin, MD  Insulin Glargine (LANTUS SOLOSTAR) 100 UNIT/ML Solostar Pen Inject 90 Units into the skin every morning. 03/18/17  Yes Renato Shin, MD  levothyroxine (SYNTHROID, LEVOTHROID) 50 MCG tablet Take 1 tablet (50 mcg total) by mouth at bedtime. 07/24/15  Yes [name], MD  Multiple Vitamins-Minerals (ONE-A-DAY MENS 50+ ADVANTAGE PO) Take by mouth.   Yes [provider]  ondansetron (ZOFRAN-ODT) 4 MG disintegrating tablet Take 2 tablets (8 mg total) by mouth every 8 (eight) hours as needed for nausea or vomiting. 02/11/16  Yes Comer, Okey Regal, MD  pregabalin (LYRICA) 100 MG capsule Take 1 capsule (100 mg total) by mouth 3 (three) times daily. 12/11/16  Yes [name], MD  protriptyline (VIVACTIL) 10 MG tablet Take 10 mg by mouth 3 (three) times daily.  01/30/16  Yes [provider]  ranitidine (ZANTAC) 150  MG tablet Take 150 mg by mouth daily as needed.    Yes [provider]  REXULTI 1 MG TABS  11/10/16  Yes [provider]  SURE COMFORT PEN NEEDLES 31G X 8 MM MISC USE TO INJECT LANTUS AND [place]. 03/18/17  Yes Renato Shin, MD  terbinafine (LAMISIL) 250 MG tablet Take one tablet daily for 1 week out of each month 01/20/17  Yes Stover, Titorya, DPM  TRINTELLIX 20 MG TABS Take 20 mg by mouth at bedtime. 01/26/15  Yes [provider]  TRIUMEQ 600-50-300 MG tablet TAKE 1 TABLET BY MOUTH DAILY 01/09/17  Yes Comer, Okey Regal, MD  UNABLE TO FIND CPAP MACHINE with standard Aclaim nasal mask with humidifier. Set at 14 cwp Patient taking differently: CPAP MACHINE with standard Aclaim nasal mask with humidifier. Set at 4 cwp 04/12/13  Yes Arlyss Queen  A, MD  XARELTO 15 MG TABS tablet TAKE ONE TABLET BY MOUTH DAILY. 01/23/17  Yes [name], MD  zolpidem (AMBIEN) 10 MG tablet Take 10 mg by mouth at bedtime as needed for sleep.    Yes [provider]   Social History   Socioeconomic History  . Marital status: Single    Spouse name: Not on file  . Number of children: Not on file  . Years of education: Not on file  . Highest education level: Not on file  Social Needs  . Financial resource strain: Not on file  . Food insecurity - worry: Not on file  . Food insecurity - inability: Not on file  . Transportation needs - medical: Not on file  . Transportation needs - non-medical: Not on file  Occupational History  . Not on file  Tobacco Use  . Smoking status: Former Smoker    Packs/day: 0.10    Years: 10.00    Pack years: 1.00    Types: Cigars    Last attempt to quit: 08/09/2014    Years since quitting: 2.6  . Smokeless tobacco: Never Used  Substance and Sexual Activity  . Alcohol use: No    Alcohol/week: 0.0 oz  . Drug use: No  . Sexual activity: Not Currently    Partners: Male    Comment: pt. declined condoms  Other Topics Concern  . Not on file    Social History Narrative   Epworth Sleepiness Scale = 7 (as of 03/16/2015)      Review of Systems  Constitutional: Negative for chills and fever.  HENT: Positive for sore throat.   Respiratory: Negative for cough.   Musculoskeletal: Positive for back pain. Negative for myalgias.       Objective:   Physical Exam  Constitutional: He is oriented to person, place, and time. He appears well-developed and well-nourished. No distress.  HENT:  Head: Normocephalic and atraumatic.  Slight white coating of the tongue. The buccal mucosa is uninvolved. Minimal erythema of the posterior oropharynx. No exudate. No other mouth lesions.   Eyes: Conjunctivae are normal. Pupils are equal, round, and reactive to light. No scleral icterus.  Neck: Neck supple.  No significant lymphadenopathy in the neck. Slight prominance of the left AC node. Slight tenderness.    Cardiovascular: Normal rate.  Pulmonary/Chest: Effort normal.  Abdominal: He exhibits no distension.  Neurological: He is alert and oriented to person, place, and time.  Skin: He is not diaphoretic. There is erythema.  Multiple abrasions and small punctures. Few with some sur erythema approximately 2 mm out from wounds.  Vitals reviewed.   Vitals:   04/16/17 1741  BP: 133/84  Pulse: 98  Resp: 16  Temp: 98.1 F (36.7 C)  TempSrc: Oral  SpO2: 96%  Weight: 249 lb (112.9 kg)  Height: 5' 11"  (1.803 m)  HC: 5" (12.7 cm)       Assessment & Plan:  PRAJWAL FELLNER is a 62 y.o. male Sore throat - Plan: POCT rapid strep A, amoxicillin-clavulanate (AUGMENTIN) 875-125 MG tablet, Lymphadenopathy - Plan: Culture, Group A Strep, POCT rapid strep A, amoxicillin-clavulanate (AUGMENTIN) 875-125 MG tablet  - Reassuring strep and flu testing. Prior lymphadenopathy, but slight improvement of symptoms. Viral illness possible, but Augmentin prescription given if symptoms worsen as well as for treatment of dog bites/wounds below. Culture pending.    Abrasion of left upper extremity, initial encounter - Plan: amoxicillin-clavulanate (AUGMENTIN) 875-125 MG tablet Dog bite, initial encounter - Plan:  amoxicillin-clavulanate (AUGMENTIN) 875-125 MG tablet  -Multiple small punctures from the dog at home. No apparent certain infection at present or discharge, wound care discussed with clean with soap and water, and if any surrounding erythema, discharge, start Augmentin. History of HIV, but most recent CD4/viral load are reassuring.  Tongue coating - Plan: POCT Skin KOH  -Negative thrush testing, but if symptoms persist, trial of Mycelex as possible false-negative testing.  Meds ordered this encounter  Medications  . amoxicillin-clavulanate (AUGMENTIN) 875-125 MG tablet    Sig: Take 1 tablet by mouth 2 (two) times daily.    Dispense:  20 tablet    Refill:  0   Patient Instructions   I will let you know results of strep test, and thrush test, but I expect those to be normal. Cleanse bites on arms/abrasions with soap and water, if any increased redness, or any discharge, start antibiotic.   Also if you have worsening sore throat, fevers, or more swelling of the lymph node on the side of your neck, start Augmentin as we discussed.  Return to the clinic or go to the nearest emergency room if any of your symptoms worsen or new symptoms occur.   Animal Bite Animal bites can range from mild to serious. An animal bite can result in a scratch on the skin, a deep open cut, a puncture of the skin, a crush injury, or tearing away of the skin or a body part. A small bite from a house pet will usually not cause serious problems. However, some animal bites can become infected or injure a bone or other tissue. Bites from certain animals can be more dangerous because of the risk of spreading rabies, which is a serious viral infection. This risk is higher with bites from stray animals or wild animals, such as raccoons, foxes, skunks, and bats. Dogs are  responsible for most animal bites. Children are bitten more often than adults. What are the signs or symptoms? Common symptoms of an animal bite include:  Pain.  Bleeding.  Swelling.  Bruising.  How is this diagnosed? This condition may be diagnosed based on a physical exam and medical history. Your health care provider will examine the wound and ask for details about the animal and how the bite happened. You may also have tests, such as:  Blood tests to check for infection or to determine if surgery is needed.  X-rays to check for damage to bones or joints.  Culture test. This uses a sample of fluid from the wound to check for infection.  How is this treated? Treatment varies depending on the location and type of animal bite and your medical history. Treatment may include:  Wound care. This often includes cleaning the wound, flushing the wound with saline solution, and applying a bandage (dressing). Sometimes, the wound is left open to heal because of the high risk of infection. However, in some cases, the wound may be closed with stitches (sutures), staples, skin glue, or adhesive strips.  Antibiotic medicine.  Tetanus shot.  Rabies treatment if the animal could have rabies.  In some cases, bites that have become infected may require IV antibiotics and surgical treatment in the hospital. Follow these instructions at home: Wound care  Follow instructions from your health care provider about how to take care of your wound. Make sure you: ? Wash your hands with soap and water before you change your dressing. If soap and water are not available, use hand sanitizer. ? Change your dressing  as told by your health care provider. ? Leave sutures, skin glue, or adhesive strips in place. These skin closures may need to be in place for 2 weeks or longer. If adhesive strip edges start to loosen and curl up, you may trim the loose edges. Do not remove adhesive strips completely unless your  health care provider tells you to do that.  Check your wound every day for signs of infection. Watch for: ? Increasing redness, swelling, or pain. ? Fluid, blood, or pus. General instructions  Take or apply over-the-counter and prescription medicines only as told by your health care provider.  If you were prescribed an antibiotic, take or apply it as told by your health care provider. Do not stop using the antibiotic even if your condition improves.  Keep the injured area raised (elevated) above the level of your heart while you are sitting or lying down, if this is possible.  If directed, apply ice to the injured area. ? Put ice in a plastic bag. ? Place a towel between your skin and the bag. ? Leave the ice on for 20 minutes, 2-3 times per day.  Keep all follow-up visits as told by your health care provider. This is important. Contact a health care provider if:  You have increasing redness, swelling, or pain at the site of your wound.  You have a general feeling of sickness (malaise).  You feel nauseous or you vomit.  You have pain that does not get better. Get help right away if:  You have a red streak extending away from your wound.  You have fluid, blood, or pus coming from your wound.  You have a fever or chills.  You have trouble moving your injured area.  You have numbness or tingling extending beyond the wound. This information is not intended to replace advice given to you by your health care provider. Make sure you discuss any questions you have with your health care provider. Document Released: 10/29/2010 Document Revised: 06/20/2015 Document Reviewed: 06/28/2014 Elsevier Interactive Patient Education  2018 [place].  Sore Throat A sore throat is pain, burning, irritation, or scratchiness in the throat. When you have a sore throat, you may feel pain or tenderness in your throat when you swallow or talk. Many things can cause a sore throat,  including:  An infection.  Seasonal allergies.  Dryness in the air.  Irritants, such as smoke or pollution.  Gastroesophageal reflux disease (GERD).  A tumor.  A sore throat is often the first sign of another sickness. It may happen with other symptoms, such as coughing, sneezing, fever, and swollen neck glands. Most sore throats go away without medical treatment. Follow these instructions at home:  Take over-the-counter medicines only as told by your health care provider.  Drink enough fluids to keep your urine clear or pale yellow.  Rest as needed.  To help with pain, try: ? Sipping warm liquids, such as broth, herbal tea, or warm water. ? Eating or drinking cold or frozen liquids, such as frozen ice pops. ? Gargling with a salt-water mixture 3-4 times a day or as needed. To make a salt-water mixture, completely dissolve -1 tsp of salt in 1 cup of warm water. ? Sucking on hard candy or throat lozenges. ? Putting a cool-mist humidifier in your bedroom at night to moisten the air. ? Sitting in the bathroom with the door closed for 5-10 minutes while you run hot water in the shower.  Do not use any  tobacco products, such as cigarettes, chewing tobacco, and e-cigarettes. If you need help quitting, ask your health care provider. Contact a health care provider if:  You have a fever for more than 2-3 days.  You have symptoms that last (are persistent) for more than 2-3 days.  Your throat does not get better within 7 days.  You have a fever and your symptoms suddenly get worse. Get help right away if:  You have difficulty breathing.  You cannot swallow fluids, soft foods, or your saliva.  You have increased swelling in your throat or neck.  You have persistent nausea and vomiting. This information is not intended to replace advice given to you by your health care provider. Make sure you discuss any questions you have with your health care provider. Document Released:  03/20/2004 Document Revised: 10/07/2015 Document Reviewed: 12/01/2014 Elsevier Interactive Patient Education  2018 [employer].   IF you received an x-ray today, you will receive an invoice from [facility]. Please contact [facility] at [phone] with questions or concerns regarding your invoice.   IF you received labwork today, you will receive an invoice from [facility]. Please contact [facility] at [phone] with questions or concerns regarding your invoice.   Our billing staff will not be able to assist you with questions regarding bills from these companies.  You will be contacted with the lab results as soon as they are available. The fastest way to get your results is to activate your My Chart account. Instructions are located on the last page of this paperwork. If you have not heard from [name] regarding the results in 2 weeks, please contact this office.      I personally performed the services described in this documentation, which was scribed in my presence. The recorded information has been reviewed and considered for accuracy and completeness, addended by me as needed, and agree with information above.  Signed,   [name], MD [facility] at [place].  04/18/17 2:00 PM

## 2017-04-16 NOTE — Patient Instructions (Addendum)
I will let you know results of strep test, and thrush test, but I expect those to be normal. Cleanse bites on arms/abrasions with soap and water, if any increased redness, or any discharge, start antibiotic.   Also if you have worsening sore throat, fevers, or more swelling of the lymph node on the side of your neck, start Augmentin as we discussed.  Return to the clinic or go to the nearest emergency room if any of your symptoms worsen or new symptoms occur.   Animal Bite Animal bites can range from mild to serious. An animal bite can result in a scratch on the skin, a deep open cut, a puncture of the skin, a crush injury, or tearing away of the skin or a body part. A small bite from a house pet will usually not cause serious problems. However, some animal bites can become infected or injure a bone or other tissue. Bites from certain animals can be more dangerous because of the risk of spreading rabies, which is a serious viral infection. This risk is higher with bites from stray animals or wild animals, such as raccoons, foxes, skunks, and bats. Dogs are responsible for most animal bites. Children are bitten more often than adults. What are the signs or symptoms? Common symptoms of an animal bite include:  Pain.  Bleeding.  Swelling.  Bruising.  How is this diagnosed? This condition may be diagnosed based on a physical exam and medical history. Your health care provider will examine the wound and ask for details about the animal and how the bite happened. You may also have tests, such as:  Blood tests to check for infection or to determine if surgery is needed.  X-rays to check for damage to bones or joints.  Culture test. This uses a sample of fluid from the wound to check for infection.  How is this treated? Treatment varies depending on the location and type of animal bite and your medical history. Treatment may include:  Wound care. This often includes cleaning the wound,  flushing the wound with saline solution, and applying a bandage (dressing). Sometimes, the wound is left open to heal because of the high risk of infection. However, in some cases, the wound may be closed with stitches (sutures), staples, skin glue, or adhesive strips.  Antibiotic medicine.  Tetanus shot.  Rabies treatment if the animal could have rabies.  In some cases, bites that have become infected may require IV antibiotics and surgical treatment in the hospital. Follow these instructions at home: Wound care  Follow instructions from your health care provider about how to take care of your wound. Make sure you: ? Wash your hands with soap and water before you change your dressing. If soap and water are not available, use hand sanitizer. ? Change your dressing as told by your health care provider. ? Leave sutures, skin glue, or adhesive strips in place. These skin closures may need to be in place for 2 weeks or longer. If adhesive strip edges start to loosen and curl up, you may trim the loose edges. Do not remove adhesive strips completely unless your health care provider tells you to do that.  Check your wound every day for signs of infection. Watch for: ? Increasing redness, swelling, or pain. ? Fluid, blood, or pus. General instructions  Take or apply over-the-counter and prescription medicines only as told by your health care provider.  If you were prescribed an antibiotic, take or apply it as told by  your health care provider. Do not stop using the antibiotic even if your condition improves.  Keep the injured area raised (elevated) above the level of your heart while you are sitting or lying down, if this is possible.  If directed, apply ice to the injured area. ? Put ice in a plastic bag. ? Place a towel between your skin and the bag. ? Leave the ice on for 20 minutes, 2-3 times per day.  Keep all follow-up visits as told by your health care provider. This is  important. Contact a health care provider if:  You have increasing redness, swelling, or pain at the site of your wound.  You have a general feeling of sickness (malaise).  You feel nauseous or you vomit.  You have pain that does not get better. Get help right away if:  You have a red streak extending away from your wound.  You have fluid, blood, or pus coming from your wound.  You have a fever or chills.  You have trouble moving your injured area.  You have numbness or tingling extending beyond the wound. This information is not intended to replace advice given to you by your health care provider. Make sure you discuss any questions you have with your health care provider. Document Released: 10/29/2010 Document Revised: 06/20/2015 Document Reviewed: 06/28/2014 Elsevier Interactive Patient Education  2018 [place].  Sore Throat A sore throat is pain, burning, irritation, or scratchiness in the throat. When you have a sore throat, you may feel pain or tenderness in your throat when you swallow or talk. Many things can cause a sore throat, including:  An infection.  Seasonal allergies.  Dryness in the air.  Irritants, such as smoke or pollution.  Gastroesophageal reflux disease (GERD).  A tumor.  A sore throat is often the first sign of another sickness. It may happen with other symptoms, such as coughing, sneezing, fever, and swollen neck glands. Most sore throats go away without medical treatment. Follow these instructions at home:  Take over-the-counter medicines only as told by your health care provider.  Drink enough fluids to keep your urine clear or pale yellow.  Rest as needed.  To help with pain, try: ? Sipping warm liquids, such as broth, herbal tea, or warm water. ? Eating or drinking cold or frozen liquids, such as frozen ice pops. ? Gargling with a salt-water mixture 3-4 times a day or as needed. To make a salt-water mixture, completely dissolve  -1 tsp of salt in 1 cup of warm water. ? Sucking on hard candy or throat lozenges. ? Putting a cool-mist humidifier in your bedroom at night to moisten the air. ? Sitting in the bathroom with the door closed for 5-10 minutes while you run hot water in the shower.  Do not use any tobacco products, such as cigarettes, chewing tobacco, and e-cigarettes. If you need help quitting, ask your health care provider. Contact a health care provider if:  You have a fever for more than 2-3 days.  You have symptoms that last (are persistent) for more than 2-3 days.  Your throat does not get better within 7 days.  You have a fever and your symptoms suddenly get worse. Get help right away if:  You have difficulty breathing.  You cannot swallow fluids, soft foods, or your saliva.  You have increased swelling in your throat or neck.  You have persistent nausea and vomiting. This information is not intended to replace advice given to you by  your health care provider. Make sure you discuss any questions you have with your health care provider. Document Released: 03/20/2004 Document Revised: 10/07/2015 Document Reviewed: 12/01/2014 Elsevier Interactive Patient Education  2018 [employer].   IF you received an x-ray today, you will receive an invoice from [facility]. Please contact [facility] at [phone] with questions or concerns regarding your invoice.   IF you received labwork today, you will receive an invoice from [facility]. Please contact [facility] at [phone] with questions or concerns regarding your invoice.   Our billing staff will not be able to assist you with questions regarding bills from these companies.  You will be contacted with the lab results as soon as they are available. The fastest way to get your results is to activate your My Chart account. Instructions are located on the last page of this paperwork. If you have not heard from [name] regarding the results  in 2 weeks, please contact this office.

## 2017-04-18 LAB — CULTURE, GROUP A STREP: Strep A Culture: NEGATIVE

## 2017-04-21 ENCOUNTER — Encounter: Payer: Self-pay | Admitting: Family Medicine

## 2017-04-21 ENCOUNTER — Other Ambulatory Visit: Payer: Self-pay

## 2017-04-21 ENCOUNTER — Ambulatory Visit (INDEPENDENT_AMBULATORY_CARE_PROVIDER_SITE_OTHER): Payer: BLUE CROSS/BLUE SHIELD

## 2017-04-21 ENCOUNTER — Ambulatory Visit: Payer: BLUE CROSS/BLUE SHIELD | Admitting: Sports Medicine

## 2017-04-21 ENCOUNTER — Ambulatory Visit: Payer: BLUE CROSS/BLUE SHIELD | Admitting: Family Medicine

## 2017-04-21 VITALS — BP 138/72 | HR 101 | Temp 98.5°F | Resp 18 | Ht 71.0 in | Wt 247.8 lb

## 2017-04-21 DIAGNOSIS — M545 Low back pain, unspecified: Secondary | ICD-10-CM

## 2017-04-21 DIAGNOSIS — M25562 Pain in left knee: Secondary | ICD-10-CM

## 2017-04-21 DIAGNOSIS — M25561 Pain in right knee: Secondary | ICD-10-CM

## 2017-04-21 DIAGNOSIS — S80212A Abrasion, left knee, initial encounter: Secondary | ICD-10-CM

## 2017-04-21 MED ORDER — CYCLOBENZAPRINE HCL 5 MG PO TABS: 2.5000 mg | ORAL_TABLET | Freq: Every evening | ORAL | 0 refills | Status: DC | PRN

## 2017-04-21 NOTE — Patient Instructions (Addendum)
Continue range of motion, stretches, Tylenol for needed for low back pain. I will refill Flexeril until you are able to follow-up with orthopedics next week. Do not combine that with other sedating medicines including Ambien. If still having back pain at that time, would discuss with them further evaluation.  I'll check x-rays of your knees today, but unlikely to have fracture. Tylenol is safest, range of motion when seated before standing up to see if that will lessen the instability symptoms. If any worsening prior to visit with orthopedics next week, return for recheck.   Return to the clinic or go to the nearest emergency room if any of your symptoms worsen or new symptoms occur.   Back Pain, Adult Many adults have back pain from time to time. Common causes of back pain include:  A strained muscle or ligament.  Wear and tear (degeneration) of the spinal disks.  Arthritis.  A hit to the back.  Back pain can be short-lived (acute) or last a long time (chronic). A physical exam, lab tests, and imaging studies may be done to find the cause of your pain. Follow these instructions at home: Managing pain and stiffness  Take over-the-counter and prescription medicines only as told by your health care provider.  If directed, apply heat to the affected area as often as told by your health care provider. Use the heat source that your health care provider recommends, such as a moist heat pack or a heating pad. ? Place a towel between your skin and the heat source. ? Leave the heat on for 20-30 minutes. ? Remove the heat if your skin turns bright red. This is especially important if you are unable to feel pain, heat, or cold. You have a greater risk of getting burned.  If directed, apply ice to the injured area: ? Put ice in a plastic bag. ? Place a towel between your skin and the bag. ? Leave the ice on for 20 minutes, 2-3 times a day for the first 2-3 days. Activity  Do not stay in bed.  Resting more than 1-2 days can delay your recovery.  Take short walks on even surfaces as soon as you are able. Try to increase the length of time you walk each day.  Do not sit, drive, or stand in one place for more than 30 minutes at a time. Sitting or standing for long periods of time can put stress on your back.  Use proper lifting techniques. When you bend and lift, use positions that put less stress on your back: ? [place] your knees. ? Keep the load close to your body. ? Avoid twisting.  Exercise regularly as told by your health care provider. Exercising will help your back heal faster. This also helps prevent back injuries by keeping muscles strong and flexible.  Your health care provider may recommend that you see a physical therapist. This person can help you come up with a safe exercise program. Do any exercises as told by your physical therapist. Lifestyle  Maintain a healthy weight. Extra weight puts stress on your back and makes it difficult to have good posture.  Avoid activities or situations that make you feel anxious or stressed. Learn ways to manage anxiety and stress. One way to manage stress is through exercise. Stress and anxiety increase muscle tension and can make back pain worse. General instructions  Sleep on a firm mattress in a comfortable position. Try lying on your side with your knees slightly bent.  If you lie on your back, put a pillow under your knees.  Follow your treatment plan as told by your health care provider. This may include: ? Cognitive or behavioral therapy. ? Acupuncture or massage therapy. ? Meditation or yoga. Contact a health care provider if:  You have pain that is not relieved with rest or medicine.  You have increasing pain going down into your legs or buttocks.  Your pain does not improve in 2 weeks.  You have pain at night.  You lose weight.  You have a fever or chills. Get help right away if:  You develop new bowel or  bladder control problems.  You have unusual weakness or numbness in your arms or legs.  You develop nausea or vomiting.  You develop abdominal pain.  You feel faint. Summary  Many adults have back pain from time to time. A physical exam, lab tests, and imaging studies may be done to find the cause of your pain.  Use proper lifting techniques. When you bend and lift, use positions that put less stress on your back.  Take over-the-counter and prescription medicines and apply heat or ice as directed by your health care provider. This information is not intended to replace advice given to you by your health care provider. Make sure you discuss any questions you have with your health care provider. Document Released: 02/10/2005 Document Revised: 03/17/2016 Document Reviewed: 03/17/2016 Elsevier Interactive Patient Education  2018 [employer].  Knee Pain, Adult Knee pain in adults is common. It can be caused by many things, including:  Arthritis.  A fluid-filled sac (cyst) or growth in your knee.  An infection in your knee.  An injury that will not heal.  Damage, swelling, or irritation of the tissues that support your knee.  Knee pain is usually not a sign of a serious problem. The pain may go away on its own with time and rest. If it does not, a health care provider may order tests to find the cause of the pain. These may include:  Imaging tests, such as an X-ray, MRI, or ultrasound.  Joint aspiration. In this test, fluid is removed from the knee.  Arthroscopy. In this test, a lighted tube is inserted into knee and an image is projected onto a TV screen.  A biopsy. In this test, a sample of tissue is removed from the body and studied under a microscope.  Follow these instructions at home: Pay attention to any changes in your symptoms. Take these actions to relieve your pain. Activity  Rest your knee.  Do not do things that cause pain or make pain worse.  Avoid  high-impact activities or exercises, such as running, jumping rope, or doing jumping jacks. General instructions  Take over-the-counter and prescription medicines only as told by your health care provider.  Raise (elevate) your knee above the level of your heart when you are sitting or lying down.  Sleep with a pillow under your knee.  If directed, apply ice to the knee: ? Put ice in a plastic bag. ? Place a towel between your skin and the bag. ? Leave the ice on for 20 minutes, 2-3 times a day.  Ask your health care provider if you should wear an elastic knee support.  Lose weight if you are overweight. Extra weight can put pressure on your knee.  Do not use any products that contain nicotine or tobacco, such as cigarettes and e-cigarettes. Smoking may slow the healing of any bone  and joint problems that you may have. If you need help quitting, ask your health care provider. Contact a health care provider if:  Your knee pain continues, changes, or gets worse.  You have a fever along with knee pain.  Your knee buckles or locks up.  Your knee swells, and the swelling becomes worse. Get help right away if:  Your knee feels warm to the touch.  You cannot move your knee.  You have severe pain in your knee.  You have chest pain.  You have trouble breathing. Summary  Knee pain in adults is common. It can be caused by many things, including, arthritis, infection, cysts, or injury.  Knee pain is usually not a sign of a serious problem, but if it does not go away, a health care provider may perform tests to know the cause of the pain.  Pay attention to any changes in your symptoms. Relieve your pain with rest, medicines, light activity, and use of ice.  Get help if your pain continues or becomes very severe, or if your knee buckles or locks up, or if you have chest pain or trouble breathing. This information is not intended to replace advice given to you by your health care  provider. Make sure you discuss any questions you have with your health care provider. Document Released: 12/08/2006 Document Revised: 02/01/2016 Document Reviewed: 02/01/2016 Elsevier Interactive Patient Education  2018 [employer].    IF you received an x-ray today, you will receive an invoice from [facility]. Please contact [facility] at [phone] with questions or concerns regarding your invoice.   IF you received labwork today, you will receive an invoice from [facility]. Please contact [facility] at [phone] with questions or concerns regarding your invoice.   Our billing staff will not be able to assist you with questions regarding bills from these companies.  You will be contacted with the lab results as soon as they are available. The fastest way to get your results is to activate your My Chart account. Instructions are located on the last page of this paperwork. If you have not heard from [name] regarding the results in 2 weeks, please contact this office.

## 2017-04-21 NOTE — Progress Notes (Signed)
Subjective:    Patient ID: Daniel [name], male    DOB: 1955-10-09, 62 y.o.   MRN: [id number]  HPI  Daniel [name] is a 62 y.o. male Presents today for: Chief Complaint  Patient presents with  . Back Pain    Pt states back isn't stiff just hurts. Pt states pains meds have started to to help.   . Follow-up  . Knee Pain    x4 days, pt states he fell and hurt his knees. Pt states his knees have been stiff since the fall. Pt states he fell down one step. Pt states he didn't take in to account te height of the step. Pt states he fell on his knees and rolled.   History of multiple medical problems - being seen today for knee pain and back pain.   Back pain: Seen 03/31/2017. History of MRI April 2017 with L4-5 moderate facet hypertrophy, L5-S1 small midline disc protrusion with mild facet hypertrophy, mild right minimal left foraminal narrowing without apparent nerve root impingement. Reported intermittent back pain, primarily central to right side for 2 weeks at February 5 visit. Pain worse with left greater than right lateral flexion at that time.   No apparent red flag symptoms at that time. Suspected mechanical pain/degenerative disc disease with facet disease. Discussed Tylenol, range of motion, Biofreeze and low-dose Flexeril. deferred imaging.   Has been taking 1/2-1 flexeril twice per day.  Still some soreness. Still R sided, but runs up and down. Feels better, but running up and down. No bowel or bladder incontinence, no saddle anesthesia, no lower extremity weakness. No change in pain with recent fall.   Knee pain: Pain past 4 days. States he fell and hurt knees with stiffness since fall. Coming down steps missed last step, both knees hit concrete and rolled over. Able to weight bear.  Abrasion on left knee, but pain in R knee primarily. Has had some pain in R knee in past, but now with giving way feeling if sitting for awhile, then standing. L knee feels unstable a little, but not as bad  as the right. Has appt with ortho next Wednesday-  Dr. Fredonia [name].   Review of Systems  Genitourinary: Negative for difficulty urinating.  Musculoskeletal: Positive for back pain and joint swelling (min on right after fall. ).  Skin: Positive for wound (abrasion knee. left. ).       Objective:   Physical Exam  Constitutional: He is oriented to person, place, and time. He appears well-developed and well-nourished.  HENT:  Head: Normocephalic and atraumatic.  Pulmonary/Chest: Effort normal.  Musculoskeletal:       Right knee: He exhibits normal range of motion, no swelling and no bony tenderness. No tenderness found.       Left knee: He exhibits normal range of motion, no swelling and no bony tenderness. No tenderness found.       Lumbar back: He exhibits normal range of motion.  Neurological: He is alert and oriented to person, place, and time.  Skin: Skin is warm and dry.  Healing abrasion left anterior knee. No surrounding erythema.   Psychiatric: He has a normal mood and affect. His behavior is normal.   Dg Lumbar Spine 2-3 Views  Result Date: 04/21/2017 CLINICAL DATA:  Fall 4 days ago low back pain EXAM: LUMBAR SPINE - 2-3 VIEW COMPARISON:  CT 02/10/2017 FINDINGS: Mild left SI joint disease. Lumbar alignment within normal limits. Vertebral body heights are normal. Mild degenerative changes  at L5-S1. Small anterior osteophytes at most levels. Aortic atherosclerosis. IMPRESSION: Mild degenerative changes.  No acute osseous abnormality Electronically Signed   By: Donavan Foil M.D.   On: 04/21/2017 17:58   Dg Knee Complete 4 Views Left  Result Date: 04/21/2017 CLINICAL DATA:  Fall 4 days ago. Instability of the right more than left knees. EXAM: LEFT KNEE - COMPLETE 4+ VIEW COMPARISON:  03/17/2013 FINDINGS: No evidence of fracture, dislocation, or joint effusion. Mild spurring at the patellofemoral and medial compartments. IMPRESSION: 1. No acute finding. 2. Mild degenerative spurring.  Electronically Signed   By: Monte Fantasia M.D.   On: 04/21/2017 18:02   Dg Knee Complete 4 Views Right  Result Date: 04/21/2017 CLINICAL DATA:  Pain in both knees. EXAM: RIGHT KNEE - COMPLETE 4+ VIEW COMPARISON:  03/17/2013 FINDINGS: No evidence of fracture, dislocation, or joint effusion. Moderate degenerative spurring greatest at the medial compartment where there is likely also joint narrowing. IMPRESSION: 1. No acute finding. 2. Moderate degenerative spurring. Electronically Signed   By: Monte Fantasia M.D.   On: 04/21/2017 18:07       Assessment & Plan:    Daniel [name] is a 62 y.o. male Pain in both knees, unspecified chronicity - Plan: DG Knee Complete 4 Views Left, DG Knee Complete 4 Views Right Abrasion, knee, left, initial encounter  -Suspected contusion with abrasion. No concerning findings on x-ray. Discussed range of motion well-seated to lessen feeling of instability with standing. However if he has persistent instability recommended discussion at upcoming follow-up with his orthopedist  Right-sided low back pain without sciatica, unspecified chronicity - Plan: DG Lumbar Spine 2-3 Views Acute right-sided low back pain without sciatica - Plan: cyclobenzaprine (FLEXERIL) 5 MG tablet  -Reassuring imaging. Flexeril refilled, potential side effects and risks discussed, as well as avoidance with other sedating medications. Tylenol as needed. Continue range of motion/stretches. Can discuss at upcoming follow-up with orthopedics.  Meds ordered this encounter  Medications  . cyclobenzaprine (FLEXERIL) 5 MG tablet    Sig: Take 0.5-1 tablets (2.5-5 mg total) by mouth at bedtime as needed.    Dispense:  10 tablet    Refill:  0   Patient Instructions   Continue range of motion, stretches, Tylenol for needed for low back pain. I will refill Flexeril until you are able to follow-up with orthopedics next week. Do not combine that with other sedating medicines including Ambien. If still  having back pain at that time, would discuss with them further evaluation.  I'll check x-rays of your knees today, but unlikely to have fracture. Tylenol is safest, range of motion when seated before standing up to see if that will lessen the instability symptoms. If any worsening prior to visit with orthopedics next week, return for recheck.   Return to the clinic or go to the nearest emergency room if any of your symptoms worsen or new symptoms occur.   Back Pain, Adult Many adults have back pain from time to time. Common causes of back pain include:  A strained muscle or ligament.  Wear and tear (degeneration) of the spinal disks.  Arthritis.  A hit to the back.  Back pain can be short-lived (acute) or last a long time (chronic). A physical exam, lab tests, and imaging studies may be done to find the cause of your pain. Follow these instructions at home: Managing pain and stiffness  Take over-the-counter and prescription medicines only as told by your health care provider.  If directed,  apply heat to the affected area as often as told by your health care provider. Use the heat source that your health care provider recommends, such as a moist heat pack or a heating pad. ? Place a towel between your skin and the heat source. ? Leave the heat on for 20-30 minutes. ? Remove the heat if your skin turns bright red. This is especially important if you are unable to feel pain, heat, or cold. You have a greater risk of getting burned.  If directed, apply ice to the injured area: ? Put ice in a plastic bag. ? Place a towel between your skin and the bag. ? Leave the ice on for 20 minutes, 2-3 times a day for the first 2-3 days. Activity  Do not stay in bed. Resting more than 1-2 days can delay your recovery.  Take short walks on even surfaces as soon as you are able. Try to increase the length of time you walk each day.  Do not sit, drive, or stand in one place for more than 30 minutes  at a time. Sitting or standing for long periods of time can put stress on your back.  Use proper lifting techniques. When you bend and lift, use positions that put less stress on your back: ? Hopedale your knees. ? Keep the load close to your body. ? Avoid twisting.  Exercise regularly as told by your health care provider. Exercising will help your back heal faster. This also helps prevent back injuries by keeping muscles strong and flexible.  Your health care provider may recommend that you see a physical therapist. This person can help you come up with a safe exercise program. Do any exercises as told by your physical therapist. Lifestyle  Maintain a healthy weight. Extra weight puts stress on your back and makes it difficult to have good posture.  Avoid activities or situations that make you feel anxious or stressed. Learn ways to manage anxiety and stress. One way to manage stress is through exercise. Stress and anxiety increase muscle tension and can make back pain worse. General instructions  Sleep on a firm mattress in a comfortable position. Try lying on your side with your knees slightly bent. If you lie on your back, put a pillow under your knees.  Follow your treatment plan as told by your health care provider. This may include: ? Cognitive or behavioral therapy. ? Acupuncture or massage therapy. ? Meditation or yoga. Contact a health care provider if:  You have pain that is not relieved with rest or medicine.  You have increasing pain going down into your legs or buttocks.  Your pain does not improve in 2 weeks.  You have pain at night.  You lose weight.  You have a fever or chills. Get help right away if:  You develop new bowel or bladder control problems.  You have unusual weakness or numbness in your arms or legs.  You develop nausea or vomiting.  You develop abdominal pain.  You feel faint. Summary  Many adults have back pain from time to time. A physical  exam, lab tests, and imaging studies may be done to find the cause of your pain.  Use proper lifting techniques. When you bend and lift, use positions that put less stress on your back.  Take over-the-counter and prescription medicines and apply heat or ice as directed by your health care provider. This information is not intended to replace advice given to you by your health  care provider. Make sure you discuss any questions you have with your health care provider. Document Released: 02/10/2005 Document Revised: 03/17/2016 Document Reviewed: 03/17/2016 Elsevier Interactive Patient Education  2018 [employer].  Knee Pain, Adult Knee pain in adults is common. It can be caused by many things, including:  Arthritis.  A fluid-filled sac (cyst) or growth in your knee.  An infection in your knee.  An injury that will not heal.  Damage, swelling, or irritation of the tissues that support your knee.  Knee pain is usually not a sign of a serious problem. The pain may go away on its own with time and rest. If it does not, a health care provider may order tests to find the cause of the pain. These may include:  Imaging tests, such as an X-ray, MRI, or ultrasound.  Joint aspiration. In this test, fluid is removed from the knee.  Arthroscopy. In this test, a lighted tube is inserted into knee and an image is projected onto a TV screen.  A biopsy. In this test, a sample of tissue is removed from the body and studied under a microscope.  Follow these instructions at home: Pay attention to any changes in your symptoms. Take these actions to relieve your pain. Activity  Rest your knee.  Do not do things that cause pain or make pain worse.  Avoid high-impact activities or exercises, such as running, jumping rope, or doing jumping jacks. General instructions  Take over-the-counter and prescription medicines only as told by your health care provider.  Raise (elevate) your knee above the  level of your heart when you are sitting or lying down.  Sleep with a pillow under your knee.  If directed, apply ice to the knee: ? Put ice in a plastic bag. ? Place a towel between your skin and the bag. ? Leave the ice on for 20 minutes, 2-3 times a day.  Ask your health care provider if you should wear an elastic knee support.  Lose weight if you are overweight. Extra weight can put pressure on your knee.  Do not use any products that contain nicotine or tobacco, such as cigarettes and e-cigarettes. Smoking may slow the healing of any bone and joint problems that you may have. If you need help quitting, ask your health care provider. Contact a health care provider if:  Your knee pain continues, changes, or gets worse.  You have a fever along with knee pain.  Your knee buckles or locks up.  Your knee swells, and the swelling becomes worse. Get help right away if:  Your knee feels warm to the touch.  You cannot move your knee.  You have severe pain in your knee.  You have chest pain.  You have trouble breathing. Summary  Knee pain in adults is common. It can be caused by many things, including, arthritis, infection, cysts, or injury.  Knee pain is usually not a sign of a serious problem, but if it does not go away, a health care provider may perform tests to know the cause of the pain.  Pay attention to any changes in your symptoms. Relieve your pain with rest, medicines, light activity, and use of ice.  Get help if your pain continues or becomes very severe, or if your knee buckles or locks up, or if you have chest pain or trouble breathing. This information is not intended to replace advice given to you by your health care provider. Make sure you discuss any questions  you have with your health care provider. Document Released: 12/08/2006 Document Revised: 02/01/2016 Document Reviewed: 02/01/2016 Elsevier Interactive Patient Education  2018 [employer].    IF you  received an x-ray today, you will receive an invoice from [facility]. Please contact [facility] at [phone] with questions or concerns regarding your invoice.   IF you received labwork today, you will receive an invoice from [place]. Please contact [facility] at [phone] with questions or concerns regarding your invoice.   Our billing staff will not be able to assist you with questions regarding bills from these companies.  You will be contacted with the lab results as soon as they are available. The fastest way to get your results is to activate your My Chart account. Instructions are located on the last page of this paperwork. If you have not heard from [name] regarding the results in 2 weeks, please contact this office.      I personally performed the services described in this documentation, which was scribed in my presence. The recorded information has been reviewed and considered for accuracy and completeness, addended by me as needed, and agree with information above.  Signed,   [name], MD [facility] [place].  04/25/17 11:11 AM

## 2017-04-28 ENCOUNTER — Encounter: Payer: Self-pay | Admitting: Sports Medicine

## 2017-04-28 ENCOUNTER — Ambulatory Visit: Payer: BLUE CROSS/BLUE SHIELD | Admitting: Sports Medicine

## 2017-04-28 DIAGNOSIS — R234 Changes in skin texture: Secondary | ICD-10-CM | POA: Diagnosis not present

## 2017-04-28 DIAGNOSIS — M21379 Foot drop, unspecified foot: Secondary | ICD-10-CM

## 2017-04-28 DIAGNOSIS — B351 Tinea unguium: Secondary | ICD-10-CM

## 2017-04-28 DIAGNOSIS — R2689 Other abnormalities of gait and mobility: Secondary | ICD-10-CM

## 2017-04-28 NOTE — Progress Notes (Signed)
Subjective: Daniel [name] is a 62 y.o. male patient seen today in office for follow up evaluation of right foot, s/p Right hallux amputation done at [facility] and ulcer to 2nd toe, reports that it has healed and wants to have his left 1st toe looked at because of callus and some bleeding. Reports also issues on right with gait after amputation. Denies any issue with PO Lamisil and is taking pulse dose and have 1 more week left. Patient denies constitional symptoms. Patient denies any other issues.   FBS not recorded, high per patient even though insulin has been adjusted last A1c was 9.1.   Patient Active Problem List   Diagnosis Date Noted  . Morbid obesity due to excess calories ([place]) complicated by DM / hyperlipidemia 01/15/2017  . DOE (dyspnea on exertion) 01/13/2017  . Confusion 09/17/2016  . Chronic migraine 09/17/2016  . Gait abnormality 09/17/2016  . Diabetic foot infection ([place]) 06/28/2016  . Screening examination for venereal disease 02/11/2016  . Critical lower limb ischemia 12/07/2015  . Fall 12/05/2015  . Toe ulcer, right ([place]) 09/19/2015  . Decreased pedal pulses 09/19/2015  . OSA on CPAP 09/05/2015  . Major depressive disorder, recurrent episode, moderate ([place]) 09/05/2015  . OSA (obstructive sleep apnea) 07/25/2015  . Passed out [facility]) 06/28/2015  . Low back pain 06/11/2015  . Abnormality of gait 06/11/2015  . Dizziness 03/17/2015  . Weakness 02/21/2015  . Chronic renal insufficiency, stage III (moderate) ([place]) 08/09/2014  . Diabetes ([place]) 11/07/2013  . Hematuria 06/21/2013  . Hepatic steatosis 09/09/2010  . UTI 09/15/2006  . PYURIA 09/03/2006  . Human immunodeficiency virus (HIV) disease ([place]) 06/04/2006  . HERPES ZOSTER, UNCOMPLICATED 85/92/9244  . HSV 06/04/2006  . Depression 06/04/2006  . DISORDER, ATTENTION DEFICIT W/HYPERACTIVITY 06/04/2006  . THROMBOPHLEBITIS NOS 06/04/2006  . GERD 06/04/2006  . ARTHRITIS, HAND 06/04/2006  . HYPERGLYCEMIA, HX OF  06/04/2006  . HEPATITIS B, HX OF 06/04/2006    Current Outpatient Medications on File Prior to Visit  Medication Sig Dispense Refill  . acetaminophen (TYLENOL) 500 MG tablet Take 1,000 mg by mouth every 8 (eight) hours as needed for moderate pain.     [name] ALPRAZolam (XANAX) 1 MG tablet Take 1 mg by mouth 2 (two) times daily.     [name] amoxicillin-clavulanate (AUGMENTIN) 875-125 MG tablet Take 1 tablet by mouth 2 (two) times daily. 20 tablet 0  . amphetamine-dextroamphetamine (ADDERALL) 30 MG tablet Take 30 mg by mouth 2 (two) times daily.     [name] atorvastatin (LIPITOR) 10 MG tablet TAKE ONE TABLET BY MOUTH DAILY. 90 tablet 0  . cyclobenzaprine (FLEXERIL) 5 MG tablet Take 0.5-1 tablets (2.5-5 mg total) by mouth at bedtime as needed. 10 tablet 0  . diclofenac sodium (VOLTAREN) 1 % GEL APPLY 2 GRAMS TO EACH KNEE IN THE MORNING AND AT BEDTIME AND APPLY 1 GRAM TO EACH KNEE IN THE AFTERNOON. 300 g 0  . divalproex (DEPAKOTE ER) 500 MG 24 hr tablet TAKE (1) TABLET BY MOUTH AT BEDTIME. 90 tablet 2  . insulin aspart (NOVOLOG FLEXPEN) 100 UNIT/ML FlexPen 3 times a day (just before each meal) 15-15-30 units 30 mL 11  . Insulin Glargine (LANTUS SOLOSTAR) 100 UNIT/ML Solostar Pen Inject 90 Units into the skin every morning. 10 pen PRN  . levothyroxine (SYNTHROID, LEVOTHROID) 50 MCG tablet Take 1 tablet (50 mcg total) by mouth at bedtime. 30 tablet 11  . Multiple Vitamins-Minerals (ONE-A-DAY MENS 50+ ADVANTAGE PO) Take by mouth.    [name]  ondansetron (ZOFRAN-ODT) 4 MG disintegrating tablet Take 2 tablets (8 mg total) by mouth every 8 (eight) hours as needed for nausea or vomiting. 60 tablet 5  . pregabalin (LYRICA) 100 MG capsule Take 1 capsule (100 mg total) by mouth 3 (three) times daily. 90 capsule 3  . protriptyline (VIVACTIL) 10 MG tablet Take 10 mg by mouth 3 (three) times daily.   11  . ranitidine (ZANTAC) 150 MG tablet Take 150 mg by mouth daily as needed.     [name] REXULTI 1 MG TABS   98  . SURE COMFORT PEN NEEDLES  31G X 8 MM MISC USE TO INJECT LANTUS AND NOVOLOG DAILY. 100 each 0  . terbinafine (LAMISIL) 250 MG tablet Take one tablet daily for 1 week out of each month 42 tablet 0  . TRINTELLIX 20 MG TABS Take 20 mg by mouth at bedtime.    . TRIUMEQ 600-50-300 MG tablet TAKE 1 TABLET BY MOUTH DAILY 30 tablet 6  . UNABLE TO FIND CPAP MACHINE with standard [employer] nasal mask with humidifier. Set at 14 cwp (Patient taking differently: CPAP MACHINE with standard [employer] nasal mask with humidifier. Set at 4 cwp) 1 each 0  . XARELTO 15 MG TABS tablet TAKE ONE TABLET BY MOUTH DAILY. 30 tablet 3  . zolpidem (AMBIEN) 10 MG tablet Take 10 mg by mouth at bedtime as needed for sleep.      No current facility-administered medications on file prior to visit.     Allergies  Allergen Reactions  . Aspirin Swelling  . Ibuprofen Swelling  . Sustiva [Efavirenz] Swelling and Rash    And rash.  . Nsaids Other (See Comments)    unknwn    Objective: There were no vitals filed for this visit.  General: No acute distress, AAOx3   Right foot: Incision well healed at surgical hallux amputation site, moderate swelling to right forefoot, blanchable erythema, decreased warmth, no drainage, There a healed ulceration at distal 2nd toe on right with minimal dry keratotic skin no opening no other signs of infection noted, nails 3-5 on right are thick yellow and mycotic with no signs of infection and proximal clearinf, Capillary fill time <5 seconds in all remaining toes on right, protective sensation diminished on right, No pain or crepitation with range of motion right foot. No pain with calf compression.   Left foot: Pre-ulcerative callus left hallux with fissure and left 2nd toe with minimal keratosis and no signs of infection. Dry skin. Nails 2-5 on left are thick yellow and mycotic with proximal clearing and no signs of infection, No other acute findings.  Pes planus foot type bilateral. Drop foot gait on right after amputation.    Assessment and Plan:  Problem List Items Addressed This Visit    None    Visit Diagnoses    Fissure in skin of foot    -  Primary   left 1st toe   Dermatophytosis of nail       Drop foot gait         -Patient seen and evaluated -Applied antibiotic cream to left 1st toe fissure and dispensed silicone padding for patient to use to toe daily -Continue lamisil pulse dosing once weekly per month for 1 more month -Advised patient to continue with diabetic shoes and insoles; Patient was assessed by Benjie Karvonen at today's visit for articulated AFO; Office to check insurance benefits and arrange casting for brace   -Advised patient to limit activity to necessity  -Advised  patient to elevate and continue with compression socks for edema control especially on right and may resume on left once fissure of 1st toe is resolved -Will plan for left toe check at next office visit. In the meantime, patient to call office if any issues or problems arise.   Landis Martins, DPM

## 2017-04-29 ENCOUNTER — Other Ambulatory Visit: Payer: Self-pay | Admitting: Family Medicine

## 2017-04-29 DIAGNOSIS — M792 Neuralgia and neuritis, unspecified: Secondary | ICD-10-CM

## 2017-04-29 NOTE — Telephone Encounter (Signed)
LOV 04/21/17 with Dr. Carlota Raspberry / Refill request for Lyrica

## 2017-04-30 ENCOUNTER — Ambulatory Visit: Payer: BLUE CROSS/BLUE SHIELD

## 2017-04-30 ENCOUNTER — Other Ambulatory Visit: Payer: BLUE CROSS/BLUE SHIELD

## 2017-04-30 DIAGNOSIS — B20 Human immunodeficiency virus [HIV] disease: Secondary | ICD-10-CM

## 2017-05-01 ENCOUNTER — Encounter: Payer: Self-pay | Admitting: Internal Medicine

## 2017-05-01 LAB — T-HELPER CELL (CD4) - (RCID CLINIC ONLY)
CD4 % Helper T Cell: 29 % — ABNORMAL LOW (ref 33–55)
CD4 T CELL ABS: 590 /uL (ref 400–2700)

## 2017-05-02 LAB — HIV-1 RNA QUANT-NO REFLEX-BLD
HIV 1 RNA QUANT: NOT DETECTED {copies}/mL
HIV-1 RNA Quant, Log: <1.3 Log copies/mL

## 2017-05-06 NOTE — Telephone Encounter (Signed)
Refilled

## 2017-05-14 ENCOUNTER — Ambulatory Visit: Payer: BLUE CROSS/BLUE SHIELD | Admitting: Internal Medicine

## 2017-05-14 ENCOUNTER — Encounter: Payer: Self-pay | Admitting: Internal Medicine

## 2017-05-14 VITALS — BP 150/92 | HR 111 | Temp 98.3°F | Ht 70.0 in | Wt 248.0 lb

## 2017-05-14 DIAGNOSIS — B20 Human immunodeficiency virus [HIV] disease: Secondary | ICD-10-CM | POA: Diagnosis not present

## 2017-05-14 DIAGNOSIS — Z113 Encounter for screening for infections with a predominantly sexual mode of transmission: Secondary | ICD-10-CM

## 2017-05-14 NOTE — Assessment & Plan Note (Signed)
Encouraged exercise.

## 2017-05-14 NOTE — Progress Notes (Signed)
   Subjective:    Patient ID: Daniel [name], male    DOB: 1956/02/11, 62 y.o.   MRN: [id number]  HPI Here for follow up of HIV I last saw him in October and he continues to do well with a CD4 of 590 and viral load < 20.  No new issues.  Continues on Triumeq.     Review of Systems  Constitutional: Negative for fatigue.  Skin: Negative for rash.       Objective:   Physical Exam  Constitutional: He appears well-developed and well-nourished. No distress.  HENT:  Mouth/Throat: No oropharyngeal exudate.  Eyes: No scleral icterus.  Cardiovascular: Normal rate, regular rhythm and normal heart sounds.  No murmur heard. Pulmonary/Chest: Effort normal and breath sounds normal. No respiratory distress.  Skin: No rash noted.          Assessment & Plan:

## 2017-05-14 NOTE — Assessment & Plan Note (Signed)
Doing well on Triumeq.  rtc 6 months.

## 2017-05-15 ENCOUNTER — Encounter: Payer: Self-pay | Admitting: Endocrinology

## 2017-05-15 ENCOUNTER — Ambulatory Visit: Payer: BLUE CROSS/BLUE SHIELD | Admitting: Endocrinology

## 2017-05-15 ENCOUNTER — Other Ambulatory Visit: Payer: Self-pay | Admitting: Family Medicine

## 2017-05-15 VITALS — BP 158/90 | HR 95 | Wt 249.2 lb

## 2017-05-15 DIAGNOSIS — E0849 Diabetes mellitus due to underlying condition with other diabetic neurological complication: Secondary | ICD-10-CM | POA: Diagnosis not present

## 2017-05-15 DIAGNOSIS — I82409 Acute embolism and thrombosis of unspecified deep veins of unspecified lower extremity: Secondary | ICD-10-CM

## 2017-05-15 DIAGNOSIS — Z794 Long term (current) use of insulin: Secondary | ICD-10-CM

## 2017-05-15 LAB — POCT GLYCOSYLATED HEMOGLOBIN (HGB A1C): HEMOGLOBIN A1C: 9.3

## 2017-05-15 MED ORDER — INSULIN GLARGINE 100 UNIT/ML SOLOSTAR PEN
110.0000 [IU] | PEN_INJECTOR | SUBCUTANEOUS | 99 refills | Status: DC
Start: 2017-05-15 — End: 2017-07-15

## 2017-05-15 NOTE — Progress Notes (Signed)
Subjective:    Patient ID: Daniel [name], male    DOB: 10/09/1955, 62 y.o.   MRN: [id number]  HPI Pt returns for f/u of diabetes mellitus: DM type: Insulin-requiring type 2.  Dx'ed: 4580 Complications: polyneuropathy, PAD, renal insufficiency, and foot ulcers.  Therapy: insulin since 2016. DKA: never Severe hypoglycemia: never.  Pancreatitis: never.   Other: he takes multiple daily injections, but basal insulin is emphasized.   Interval history: no cbg record, but states cbg's vary from 98-400.  It is in general higher as the day goes on.  He says he seldom misses the insulin.  pt states he feels well in general.  He often he often skips breakfast (and breakfast insulin). Past Medical History:  Diagnosis Date  . ADHD (attention deficit hyperactivity disorder)   . Anxiety   . Chronic kidney disease   . Clotting disorder ([place])   . Depression   . Diabetes mellitus without complication ([place])   . Diabetes mellitus, type II ([place])   . Dizziness 03/17/2015  . GERD (gastroesophageal reflux disease)   . HIV disease ([place])   . HIV infection ([place])   . Liver disease   . OSA (obstructive sleep apnea) 07/25/2015   Uses CPAP regularly  . Peripheral vascular disease ([place])   . Ulcer     Past Surgical History:  Procedure Laterality Date  . SMALL INTESTINE SURGERY    . STOMACH SURGERY    . TOE AMPUTATION Right 08/2016   right great toe    Social History   Socioeconomic History  . Marital status: Single    Spouse name: Not on file  . Number of children: Not on file  . Years of education: Not on file  . Highest education level: Not on file  Occupational History  . Not on file  Social Needs  . Financial resource strain: Not on file  . Food insecurity:    Worry: Not on file    Inability: Not on file  . Transportation needs:    Medical: Not on file    Non-medical: Not on file  Tobacco Use  . Smoking status: Former Smoker    Packs/day: 0.10    Years: 10.00    Pack years: 1.00      Types: Cigars    Last attempt to quit: 08/09/2014    Years since quitting: 2.7  . Smokeless tobacco: Never Used  Substance and Sexual Activity  . Alcohol use: No    Alcohol/week: 0.0 oz  . Drug use: No  . Sexual activity: Not Currently    Partners: Male    Comment: pt. declined condoms  Lifestyle  . Physical activity:    Days per week: Not on file    Minutes per session: Not on file  . Stress: Not on file  Relationships  . Social connections:    Talks on phone: Not on file    Gets together: Not on file    Attends religious service: Not on file    Active member of club or organization: Not on file    Attends meetings of clubs or organizations: Not on file    Relationship status: Not on file  . Intimate partner violence:    Fear of current or ex partner: Not on file    Emotionally abused: Not on file    Physically abused: Not on file    Forced sexual activity: Not on file  Other Topics Concern  . Not on file  Social History Narrative  Epworth Sleepiness Scale = 7 (as of 03/16/2015)    Current Outpatient Medications on File Prior to Visit  Medication Sig Dispense Refill  . acetaminophen (TYLENOL) 500 MG tablet Take 1,000 mg by mouth every 8 (eight) hours as needed for moderate pain.     [name] ALPRAZolam (XANAX) 1 MG tablet Take 1 mg by mouth 2 (two) times daily.     [name] amoxicillin-clavulanate (AUGMENTIN) 875-125 MG tablet Take 1 tablet by mouth 2 (two) times daily. 20 tablet 0  . amphetamine-dextroamphetamine (ADDERALL) 30 MG tablet Take 30 mg by mouth 2 (two) times daily.     [name] atorvastatin (LIPITOR) 10 MG tablet TAKE ONE TABLET BY MOUTH DAILY. 90 tablet 0  . cyclobenzaprine (FLEXERIL) 5 MG tablet Take 0.5-1 tablets (2.5-5 mg total) by mouth at bedtime as needed. 10 tablet 0  . diclofenac sodium (VOLTAREN) 1 % GEL APPLY 2 GRAMS TO EACH KNEE IN THE MORNING AND AT BEDTIME AND APPLY 1 GRAM TO EACH KNEE IN THE AFTERNOON. 300 g 0  . divalproex (DEPAKOTE ER) 500 MG 24 hr tablet TAKE  (1) TABLET BY MOUTH AT BEDTIME. 90 tablet 2  . insulin aspart (NOVOLOG FLEXPEN) 100 UNIT/ML FlexPen 3 times a day (just before each meal) 15-15-30 units 30 mL 11  . levothyroxine (SYNTHROID, LEVOTHROID) 50 MCG tablet Take 1 tablet (50 mcg total) by mouth at bedtime. 30 tablet 11  . LYRICA 100 MG capsule TAKE 1 CAPSULE BY MOUTH THREE TIMES DAILY. 90 capsule 0  . Multiple Vitamins-Minerals (ONE-A-DAY MENS 50+ ADVANTAGE PO) Take by mouth.    . ondansetron (ZOFRAN-ODT) 4 MG disintegrating tablet Take 2 tablets (8 mg total) by mouth every 8 (eight) hours as needed for nausea or vomiting. 60 tablet 5  . protriptyline (VIVACTIL) 10 MG tablet Take 10 mg by mouth 3 (three) times daily.   11  . ranitidine (ZANTAC) 150 MG tablet Take 150 mg by mouth daily as needed.     [name] REXULTI 1 MG TABS   98  . SURE COMFORT PEN NEEDLES 31G X 8 MM MISC USE TO INJECT LANTUS AND NOVOLOG DAILY. 100 each 0  . terbinafine (LAMISIL) 250 MG tablet Take one tablet daily for 1 week out of each month 42 tablet 0  . TRINTELLIX 20 MG TABS Take 20 mg by mouth at bedtime.    . TRIUMEQ 600-50-300 MG tablet TAKE 1 TABLET BY MOUTH DAILY 30 tablet 6  . UNABLE TO FIND CPAP MACHINE with standard Aclaim nasal mask with humidifier. Set at 14 cwp (Patient taking differently: CPAP MACHINE with standard Aclaim nasal mask with humidifier. Set at 4 cwp) 1 each 0  . XARELTO 15 MG TABS tablet TAKE ONE TABLET BY MOUTH DAILY. 30 tablet 3  . zolpidem (AMBIEN) 10 MG tablet Take 10 mg by mouth at bedtime as needed for sleep.      No current facility-administered medications on file prior to visit.     Allergies  Allergen Reactions  . Aspirin Swelling  . Ibuprofen Swelling  . Sustiva [Efavirenz] Swelling and Rash    And rash.  . Nsaids Other (See Comments)    unknwn    Family History  Problem Relation Age of Onset  . Depression Brother   . Throat cancer Brother        half brother, never smoker  . COPD Mother   . Diabetes Neg Hx     BP  (!) 158/90 (BP Location: Left Arm, Patient Position: Sitting, Cuff Size:  Normal)   Pulse 95   Wt 249 lb 3.2 oz (113 kg)   SpO2 97%   BMI 35.76 kg/m    Review of Systems He denies hypoglycemia.     Objective:   Physical Exam VITAL SIGNS:  See vs page.  GENERAL: no distress. Pulses: foot pulses are intact bilaterally.   MSK: no deformity of the feet or ankles, except the right great toe is absent.  CV: 1+ bilat edema of the legs  Skin: Right foot ulcer is healed.  normal color and temp on the feet and ankles Neuro: sensation is intact to touch on the feet and ankles, but decreased from normal. There is bilateral onychomycosis of the toenails.     Lab Results  Component Value Date   CREATININE 1.30 (H) 01/08/2017   BUN 18 01/08/2017   NA 137 01/08/2017   K 4.3 01/08/2017   CL 100 01/08/2017   CO2 22 01/08/2017   Lab Results  Component Value Date   HGBA1C 9.3 05/15/2017       Assessment & Plan:  Insulin-requiring type 2 DM: worse.  Renal insuff: I advised pt to monitor for hypoglycemia.    Patient Instructions  check your blood sugar twice a day.  vary the time of day when you check, between before the 3 meals, and at bedtime.  also check if you have symptoms of your blood sugar being too high or too low.  please keep a record of the readings and bring it to your next appointment here (or you can bring the meter itself).  You can write it on any piece of paper.  please call us sooner if your blood sugar goes below 70, or if you have a lot of readings over 200.   For now, please:  increase the lantus to 110 units each morning, and:  Please continue the same novolog: 3 times a day (just before each meal) 15-15-30 units.  If you skip a meal, you should also skip the novolog.   Please come back for a follow-up appointment in 2 months.

## 2017-05-15 NOTE — Patient Instructions (Addendum)
check your blood sugar twice a day.  vary the time of day when you check, between before the 3 meals, and at bedtime.  also check if you have symptoms of your blood sugar being too high or too low.  please keep a record of the readings and bring it to your next appointment here (or you can bring the meter itself).  You can write it on any piece of paper.  please call us sooner if your blood sugar goes below 70, or if you have a lot of readings over 200.   For now, please:  increase the lantus to 110 units each morning, and:  Please continue the same novolog: 3 times a day (just before each meal) 15-15-30 units.  If you skip a meal, you should also skip the novolog.   Please come back for a follow-up appointment in 2 months.

## 2017-05-15 NOTE — Telephone Encounter (Signed)
Refill of Xanax and Voltaren  Xanax is Historical Provider  LOV 04/21/17  Dr. Carlota Raspberry  [facility] - [place], [place] - [place]

## 2017-05-19 ENCOUNTER — Telehealth: Payer: Self-pay | Admitting: Endocrinology

## 2017-05-19 ENCOUNTER — Encounter: Payer: Self-pay | Admitting: Sports Medicine

## 2017-05-19 ENCOUNTER — Ambulatory Visit: Payer: BLUE CROSS/BLUE SHIELD | Admitting: Sports Medicine

## 2017-05-19 DIAGNOSIS — R234 Changes in skin texture: Secondary | ICD-10-CM | POA: Diagnosis not present

## 2017-05-19 DIAGNOSIS — Z89411 Acquired absence of right great toe: Secondary | ICD-10-CM

## 2017-05-19 DIAGNOSIS — E1140 Type 2 diabetes mellitus with diabetic neuropathy, unspecified: Secondary | ICD-10-CM

## 2017-05-19 DIAGNOSIS — E1149 Type 2 diabetes mellitus with other diabetic neurological complication: Secondary | ICD-10-CM

## 2017-05-19 DIAGNOSIS — B351 Tinea unguium: Secondary | ICD-10-CM | POA: Diagnosis not present

## 2017-05-19 NOTE — Telephone Encounter (Signed)
Patient insurance is changing the first of April.  He would like to know if we have any discount cards for the Lantus Pen and the Novolog Flex Pen   Please advise  Please leave message if patient does not answer.

## 2017-05-19 NOTE — Progress Notes (Signed)
Subjective: Daniel [name] is a 62 y.o. male patient seen today in office for follow up evaluation of Left great toe fissure. Patient states that it DOES NOT feel bad. Patient denies constitional symptoms. Requests nails to be filed. Patient denies any other issues.   FBS 169 this AM, last A1c was 9.3.   Patient Active Problem List   Diagnosis Date Noted  . Morbid obesity due to excess calories ([place]) complicated by DM / hyperlipidemia 01/15/2017  . DOE (dyspnea on exertion) 01/13/2017  . Confusion 09/17/2016  . Chronic migraine 09/17/2016  . Gait abnormality 09/17/2016  . Diabetic foot infection ([place]) 06/28/2016  . Screening examination for venereal disease 02/11/2016  . Critical lower limb ischemia 12/07/2015  . Fall 12/05/2015  . Toe ulcer, right ([place]) 09/19/2015  . Decreased pedal pulses 09/19/2015  . OSA on CPAP 09/05/2015  . Major depressive disorder, recurrent episode, moderate ([place]) 09/05/2015  . OSA (obstructive sleep apnea) 07/25/2015  . Low back pain 06/11/2015  . Abnormality of gait 06/11/2015  . Dizziness 03/17/2015  . Weakness 02/21/2015  . Chronic renal insufficiency, stage III (moderate) ([place]) 08/09/2014  . Diabetes ([place]) 11/07/2013  . Hematuria 06/21/2013  . Hepatic steatosis 09/09/2010  . Human immunodeficiency virus (HIV) disease ([place]) 06/04/2006  . HERPES ZOSTER, UNCOMPLICATED 77/41/2878  . THROMBOPHLEBITIS NOS 06/04/2006  . GERD 06/04/2006  . ARTHRITIS, HAND 06/04/2006    Current Outpatient Medications on File Prior to Visit  Medication Sig Dispense Refill  . acetaminophen (TYLENOL) 500 MG tablet Take 1,000 mg by mouth every 8 (eight) hours as needed for moderate pain.     [name] ALPRAZolam (XANAX) 1 MG tablet Take 1 mg by mouth 2 (two) times daily.     [name] amoxicillin-clavulanate (AUGMENTIN) 875-125 MG tablet Take 1 tablet by mouth 2 (two) times daily. 20 tablet 0  . amphetamine-dextroamphetamine (ADDERALL) 30 MG tablet Take 30 mg by mouth 2 (two) times daily.      [name] atorvastatin (LIPITOR) 10 MG tablet TAKE ONE TABLET BY MOUTH DAILY. 90 tablet 0  . cyclobenzaprine (FLEXERIL) 5 MG tablet Take 0.5-1 tablets (2.5-5 mg total) by mouth at bedtime as needed. 10 tablet 0  . diclofenac sodium (VOLTAREN) 1 % GEL APPLY 2 GRAMS TO EACH KNEE IN THE MORNING AND AT BEDTIME AND APPLY 1 GRAM TO EACH KNEE IN THE AFTERNOON. 300 g 0  . divalproex (DEPAKOTE ER) 500 MG 24 hr tablet TAKE (1) TABLET BY MOUTH AT BEDTIME. 90 tablet 2  . insulin aspart (NOVOLOG FLEXPEN) 100 UNIT/ML FlexPen 3 times a day (just before each meal) 15-15-30 units 30 mL 11  . Insulin Glargine (LANTUS SOLOSTAR) 100 UNIT/ML Solostar Pen Inject 110 Units into the skin every morning. 15 pen PRN  . levothyroxine (SYNTHROID, LEVOTHROID) 50 MCG tablet Take 1 tablet (50 mcg total) by mouth at bedtime. 30 tablet 11  . LYRICA 100 MG capsule TAKE 1 CAPSULE BY MOUTH THREE TIMES DAILY. 90 capsule 0  . Multiple Vitamins-Minerals (ONE-A-DAY MENS 50+ ADVANTAGE PO) Take by mouth.    . ondansetron (ZOFRAN-ODT) 4 MG disintegrating tablet Take 2 tablets (8 mg total) by mouth every 8 (eight) hours as needed for nausea or vomiting. 60 tablet 5  . protriptyline (VIVACTIL) 10 MG tablet Take 10 mg by mouth 3 (three) times daily.   11  . ranitidine (ZANTAC) 150 MG tablet Take 150 mg by mouth daily as needed.     [name] REXULTI 1 MG TABS   98  . SURE COMFORT  PEN NEEDLES 31G X 8 MM MISC USE TO INJECT LANTUS AND NOVOLOG DAILY. 100 each 0  . terbinafine (LAMISIL) 250 MG tablet Take one tablet daily for 1 week out of each month 42 tablet 0  . TRINTELLIX 20 MG TABS Take 20 mg by mouth at bedtime.    . TRIUMEQ 600-50-300 MG tablet TAKE 1 TABLET BY MOUTH DAILY 30 tablet 6  . UNABLE TO FIND CPAP MACHINE with standard Aclaim nasal mask with humidifier. Set at 14 cwp (Patient taking differently: CPAP MACHINE with standard Aclaim nasal mask with humidifier. Set at 4 cwp) 1 each 0  . XARELTO 15 MG TABS tablet TAKE ONE TABLET BY MOUTH DAILY. 30  tablet 0  . zolpidem (AMBIEN) 10 MG tablet Take 10 mg by mouth at bedtime as needed for sleep.      No current facility-administered medications on file prior to visit.     Allergies  Allergen Reactions  . Aspirin Swelling  . Ibuprofen Swelling  . Sustiva [Efavirenz] Swelling and Rash    And rash.  . Nsaids Other (See Comments)    unknwn    Objective: There were no vitals filed for this visit.  General: No acute distress, AAOx3   Right foot: Hallux amp site healed, There's a continued healed ulceration at distal 2nd toe on right with minimal dry keratotic skin no opening no other signs of infection noted, nails 3-5 on right are thick yellow and mycotic with no signs of infection and proximal clearing, Capillary fill time <5 seconds in all remaining toes on right, protective sensation diminished on right, No pain or crepitation with range of motion right foot. No pain with calf compression.   Left foot: Pre-ulcerative callus left hallux with fissure and left 2nd toe that is healed with minimal keratosis and no signs of infection. Dry skin. Nails 2-5 on left are thick yellow and mycotic with proximal clearing and no signs of infection, No other acute findings.  Pes planus foot type bilateral. Drop foot gait on right after amputation.   Assessment and Plan:  Problem List Items Addressed This Visit    None    Visit Diagnoses    Fissure in skin of foot    -  Primary   Dermatophytosis of nail       Diabetic neuropathy with neurologic complication ([place])       Status post amputation of right great toe ([place])         -Patient seen and evaluated -Recommend daily monitoring of feet -No dressings needed at healed fissure on left foot  -Mechanically smoothed nails at no charge  -Advised patient to elevate and continue with compression socks for edema control  -Will plan for routine diabetic nail care at next visit. In the meantime, patient to call office if any issues or problems arise.    Landis Martins, DPM

## 2017-05-19 NOTE — Telephone Encounter (Signed)
LVM that I did have discount cards for both. I just just needed to know if I needed to put them up front at the front desk for pick up or mail them?

## 2017-06-03 DIAGNOSIS — M170 Bilateral primary osteoarthritis of knee: Secondary | ICD-10-CM | POA: Diagnosis not present

## 2017-06-09 ENCOUNTER — Telehealth: Payer: Self-pay

## 2017-06-09 ENCOUNTER — Other Ambulatory Visit: Payer: Self-pay

## 2017-06-09 DIAGNOSIS — B20 Human immunodeficiency virus [HIV] disease: Secondary | ICD-10-CM

## 2017-06-09 MED ORDER — ABACAVIR-DOLUTEGRAVIR-LAMIVUD 600-50-300 MG PO TABS: 1.0000 | ORAL_TABLET | Freq: Every day | ORAL | 6 refills | Status: DC

## 2017-06-09 NOTE — Telephone Encounter (Signed)
Pt called today to let us know that his insurance and pharmacy had changed 05/25/17. Pt would like future medication to be sent to [facility]  speciality in [place]. I was able to assist pt with switching his pharmacy as requested and sent the prescription to [facility] in [place].  [place]

## 2017-06-10 DIAGNOSIS — M170 Bilateral primary osteoarthritis of knee: Secondary | ICD-10-CM | POA: Diagnosis not present

## 2017-06-16 ENCOUNTER — Other Ambulatory Visit: Payer: Self-pay | Admitting: Internal Medicine

## 2017-06-16 ENCOUNTER — Other Ambulatory Visit: Payer: Self-pay | Admitting: Family Medicine

## 2017-06-16 DIAGNOSIS — M545 Low back pain, unspecified: Secondary | ICD-10-CM

## 2017-06-16 DIAGNOSIS — M792 Neuralgia and neuritis, unspecified: Secondary | ICD-10-CM

## 2017-06-16 NOTE — Telephone Encounter (Signed)
Please advise on refill.   Last seen 04/21/2017. Knee pain

## 2017-06-16 NOTE — Telephone Encounter (Signed)
Discussed in January - refilled.

## 2017-06-16 NOTE — Telephone Encounter (Signed)
Patient last seen in February, and plan was to follow-up with orthopedics for back pain at that time.  If he is needing Flexeril again, needs office visit.  Thanks.

## 2017-06-16 NOTE — Telephone Encounter (Signed)
Okay to refill medication? Pt was last seen on 04/21/2017 for knee pain.   Please advise

## 2017-06-17 DIAGNOSIS — M545 Low back pain: Secondary | ICD-10-CM | POA: Diagnosis not present

## 2017-06-17 DIAGNOSIS — M170 Bilateral primary osteoarthritis of knee: Secondary | ICD-10-CM | POA: Diagnosis not present

## 2017-06-18 ENCOUNTER — Other Ambulatory Visit: Payer: Self-pay | Admitting: Family Medicine

## 2017-06-18 DIAGNOSIS — M545 Low back pain, unspecified: Secondary | ICD-10-CM

## 2017-06-19 ENCOUNTER — Other Ambulatory Visit: Payer: Self-pay | Admitting: Endocrinology

## 2017-06-19 DIAGNOSIS — I82409 Acute embolism and thrombosis of unspecified deep veins of unspecified lower extremity: Secondary | ICD-10-CM

## 2017-06-19 DIAGNOSIS — M792 Neuralgia and neuritis, unspecified: Secondary | ICD-10-CM

## 2017-06-20 NOTE — Telephone Encounter (Signed)
Please refill the pen needles prn The other meds are for conditions I do not see pt for.

## 2017-06-22 ENCOUNTER — Telehealth: Payer: Self-pay | Admitting: Family Medicine

## 2017-06-22 ENCOUNTER — Other Ambulatory Visit: Payer: Self-pay

## 2017-06-22 DIAGNOSIS — I82409 Acute embolism and thrombosis of unspecified deep veins of unspecified lower extremity: Secondary | ICD-10-CM

## 2017-06-22 DIAGNOSIS — M792 Neuralgia and neuritis, unspecified: Secondary | ICD-10-CM

## 2017-06-22 MED ORDER — INSULIN PEN NEEDLE 31G X 8 MM MISC: 0 refills | Status: DC

## 2017-06-22 MED ORDER — ATORVASTATIN CALCIUM 10 MG PO TABS: 10.0000 mg | ORAL_TABLET | Freq: Every day | ORAL | 0 refills | Status: DC

## 2017-06-22 MED ORDER — RIVAROXABAN 15 MG PO TABS: 15.0000 mg | ORAL_TABLET | Freq: Every day | ORAL | 0 refills | Status: DC

## 2017-06-22 MED ORDER — PREGABALIN 100 MG PO CAPS: 100.0000 mg | ORAL_CAPSULE | Freq: Three times a day (TID) | ORAL | 0 refills | Status: DC

## 2017-06-22 NOTE — Telephone Encounter (Signed)
Copied from [place] [id number]. Topic: Quick Communication - Rx Refill/Question >> Jun 22, 2017 11:45 AM Antonieta Iba C wrote: Medication: Lyrica, Atorvastatin, ondansetron (ZOFRAN-ODT) 4 MG disintegrating tablet, Xarelto   Has the patient contacted their pharmacy? Yes   (Agent: If no, request that the patient contact the pharmacy for the refill.) Preferred Pharmacy (with phone number or street name): [facility], [place] [phone] (Phone) [phone] (Fax)     Agent: Please be advised that RX refills may take up to 3 business days. We ask that you follow-up with your pharmacy.

## 2017-06-22 NOTE — Progress Notes (Signed)
LOV 04/21/17 Dr. Carlota Raspberry Last refill 05/16/17 # 30 with 0 refills.

## 2017-06-22 NOTE — Telephone Encounter (Signed)
Lyrica sent to the wrong pharmacy. It will need to be sent to the below pharmacy.  Last filled:06/16/17 90 tab/0 refills PIR:[id number] Pharmacy: [place], [place] - [place] [phone] (Phone) [phone] (Fax)

## 2017-06-22 NOTE — Telephone Encounter (Signed)
Faxed rx to correct pharmacy and cancelled old one. Attempted to call pt twice and pt hung up phone.

## 2017-06-22 NOTE — Telephone Encounter (Signed)
Pt returning call to [facility] regarding Lyrica. He is needing it sent to [place], [place]. He states that Helen left him a message stating it was not on his med list with Dr. Carlota Raspberry. It was called in on 06/16/17 but he told his old pharmacy to restock it due to having a falling out with them.

## 2017-06-24 DIAGNOSIS — M170 Bilateral primary osteoarthritis of knee: Secondary | ICD-10-CM | POA: Diagnosis not present

## 2017-07-03 DIAGNOSIS — G4733 Obstructive sleep apnea (adult) (pediatric): Secondary | ICD-10-CM | POA: Diagnosis not present

## 2017-07-09 ENCOUNTER — Encounter: Payer: Self-pay | Admitting: Family Medicine

## 2017-07-09 ENCOUNTER — Ambulatory Visit (INDEPENDENT_AMBULATORY_CARE_PROVIDER_SITE_OTHER): Payer: PPO | Admitting: Family Medicine

## 2017-07-09 VITALS — BP 140/86 | HR 97 | Temp 98.3°F | Ht 71.0 in | Wt 246.6 lb

## 2017-07-09 DIAGNOSIS — L739 Follicular disorder, unspecified: Secondary | ICD-10-CM | POA: Diagnosis not present

## 2017-07-09 MED ORDER — DOXYCYCLINE HYCLATE 100 MG PO TABS: 100.0000 mg | ORAL_TABLET | Freq: Two times a day (BID) | ORAL | 0 refills | Status: DC

## 2017-07-09 NOTE — Progress Notes (Signed)
Subjective:  By signing my name below, I, Daniel [name], attest that this documentation has been prepared under the direction and in the presence of Daniel Agreste, MD Electronically Signed: Ladene [name], ED Scribe 07/09/2017 at 8:19 AM.   Patient ID: Daniel [name], male    DOB: July 27, 1955, 62 y.o.   MRN: [id number]  Chief Complaint  Patient presents with  . pubic bumps    pt reports bumps to be the size of half a little finger on the right side of pubic area   HPI Daniel [name] is a 62 y.o. male who presents to Primary Care at [facility] complaining of genital rash. Multiple medical problems including h/o HIV, chronic renal insufficiency, obesity, DM. Followed by [facility] for ID. Most recent HIV RNA not detected on 3/7. CD4 of 590 on 3/7. Followed by [name] for DM with A1C of 9.3 in March.  Pt first noticed the mildly pruritic and painful rash 4 days ago, but states he noticed 2-3 more bumps today. No treatments tried PTA. Denies pus or drainage, fever, chills, cold-like symptoms, shaving the area or h/o MRSA. No antibiotic allergies.  Patient Active Problem List   Diagnosis Date Noted  . Morbid obesity due to excess calories ([place]) complicated by DM / hyperlipidemia 01/15/2017  . DOE (dyspnea on exertion) 01/13/2017  . Confusion 09/17/2016  . Chronic migraine 09/17/2016  . Gait abnormality 09/17/2016  . Diabetic foot infection ([place]) 06/28/2016  . Screening examination for venereal disease 02/11/2016  . Critical lower limb ischemia 12/07/2015  . Fall 12/05/2015  . Toe ulcer, right ([place]) 09/19/2015  . Decreased pedal pulses 09/19/2015  . OSA on CPAP 09/05/2015  . Major depressive disorder, recurrent episode, moderate ([place]) 09/05/2015  . OSA (obstructive sleep apnea) 07/25/2015  . Low back pain 06/11/2015  . Abnormality of gait 06/11/2015  . Dizziness 03/17/2015  . Weakness 02/21/2015  . Chronic renal insufficiency, stage III (moderate) ([place]) 08/09/2014  . Diabetes  ([place]) 11/07/2013  . Hematuria 06/21/2013  . Hepatic steatosis 09/09/2010  . Human immunodeficiency virus (HIV) disease ([place]) 06/04/2006  . HERPES ZOSTER, UNCOMPLICATED 30/08/6224  . THROMBOPHLEBITIS NOS 06/04/2006  . GERD 06/04/2006  . ARTHRITIS, HAND 06/04/2006   Past Medical History:  Diagnosis Date  . ADHD (attention deficit hyperactivity disorder)   . Anxiety   . Chronic kidney disease   . Clotting disorder ([place])   . Depression   . Diabetes mellitus without complication ([place])   . Diabetes mellitus, type II ([place])   . Dizziness 03/17/2015  . GERD (gastroesophageal reflux disease)   . HIV disease ([place])   . HIV infection ([place])   . Liver disease   . OSA (obstructive sleep apnea) 07/25/2015   Uses CPAP regularly  . Peripheral vascular disease ([place])   . Ulcer    Past Surgical History:  Procedure Laterality Date  . SMALL INTESTINE SURGERY    . STOMACH SURGERY    . TOE AMPUTATION Right 08/2016   right great toe   Allergies  Allergen Reactions  . Aspirin Swelling  . Ibuprofen Swelling  . Sustiva [Efavirenz] Swelling and Rash    And rash.  . Nsaids Other (See Comments)    unknwn   Prior to Admission medications   Medication Sig Start Date End Date Taking? Authorizing Provider  abacavir-dolutegravir-lamiVUDine (TRIUMEQ) 600-50-300 MG tablet Take 1 tablet by mouth daily. 06/09/17   Thayer Headings, MD  acetaminophen (TYLENOL) 500 MG tablet Take 1,000 mg by mouth every  8 (eight) hours as needed for moderate pain.     [provider]  ALPRAZolam Duanne Moron) 1 MG tablet Take 1 mg by mouth 2 (two) times daily.     [provider]  amoxicillin-clavulanate (AUGMENTIN) 875-125 MG tablet Take 1 tablet by mouth 2 (two) times daily. 04/16/17   Daniel Agreste, MD  amphetamine-dextroamphetamine (ADDERALL) 30 MG tablet Take 30 mg by mouth 2 (two) times daily.     [provider]  atorvastatin (LIPITOR) 10 MG tablet Take 1 tablet (10 mg total) by mouth daily. 06/22/17    Daniel Agreste, MD  cyclobenzaprine (FLEXERIL) 5 MG tablet Take 0.5-1 tablets (2.5-5 mg total) by mouth at bedtime as needed. 04/21/17   Daniel Agreste, MD  diclofenac sodium (VOLTAREN) 1 % GEL APPLY 2 GRAMS TO EACH KNEE IN THE MORNING AND AT BEDTIME AND APPLY 1 GRAM TO EACH KNEE IN THE AFTERNOON. 05/16/17   Daniel Agreste, MD  divalproex (DEPAKOTE ER) 500 MG 24 hr tablet TAKE (1) TABLET BY MOUTH AT BEDTIME. 01/01/17   Marcial Pacas, MD  insulin aspart (NOVOLOG FLEXPEN) 100 UNIT/ML FlexPen 3 times a day (just before each meal) 15-15-30 units 01/08/17   Renato Shin, MD  Insulin Glargine (LANTUS SOLOSTAR) 100 UNIT/ML Solostar Pen Inject 110 Units into the skin every morning. 05/15/17   Renato Shin, MD  Insulin Pen Needle (SURE COMFORT PEN NEEDLES) 31G X 8 MM MISC USE TO INJECT LANTUS AND NOVOLOG DAILY. 06/22/17   Renato Shin, MD  levothyroxine (SYNTHROID, LEVOTHROID) 50 MCG tablet Take 1 tablet (50 mcg total) by mouth at bedtime. 07/24/15   [name], MD  Multiple Vitamins-Minerals (ONE-A-DAY MENS 50+ ADVANTAGE PO) Take by mouth.    [provider]  ondansetron (ZOFRAN-ODT) 4 MG disintegrating tablet PLACE 2 TABLETS ON TONGUE EVERY 8 HOURS AS NEEDED FOR NAUSEA/VOMITING. 06/16/17   Comer, Okey Regal, MD  pregabalin (LYRICA) 100 MG capsule Take 1 capsule (100 mg total) by mouth 3 (three) times daily. 06/22/17   Daniel Agreste, MD  protriptyline (VIVACTIL) 10 MG tablet Take 10 mg by mouth 3 (three) times daily.  01/30/16   [provider]  ranitidine (ZANTAC) 150 MG tablet Take 150 mg by mouth daily as needed.     [provider]  REXULTI 1 MG TABS  11/10/16   [provider]  Rivaroxaban (XARELTO) 15 MG TABS tablet Take 1 tablet (15 mg total) by mouth daily. Pt. Needs office visit for INR /PT. 06/22/17   Daniel Agreste, MD  terbinafine (LAMISIL) 250 MG tablet Take one tablet daily for 1 week out of each month 01/20/17   Stover, Titorya, DPM  TRINTELLIX 20  MG TABS Take 20 mg by mouth at bedtime. 01/26/15   [provider]  UNABLE TO FIND CPAP MACHINE with standard Aclaim nasal mask with humidifier. Set at 14 cwp Patient taking differently: CPAP MACHINE with standard Aclaim nasal mask with humidifier. Set at 4 cwp 04/12/13   [name], MD  zolpidem (AMBIEN) 10 MG tablet Take 10 mg by mouth at bedtime as needed for sleep.     [provider]   Social History   Socioeconomic History  . Marital status: Single    Spouse name: Not on file  . Number of children: Not on file  . Years of education: Not on file  . Highest education level: Not on file  Occupational History  . Not on file  Social Needs  .  Financial resource strain: Not on file  . Food insecurity:    Worry: Not on file    Inability: Not on file  . Transportation needs:    Medical: Not on file    Non-medical: Not on file  Tobacco Use  . Smoking status: Former Smoker    Packs/day: 0.10    Years: 10.00    Pack years: 1.00    Types: Cigars    Last attempt to quit: 08/09/2014    Years since quitting: 2.9  . Smokeless tobacco: Never Used  Substance and Sexual Activity  . Alcohol use: No    Alcohol/week: 0.0 oz  . Drug use: No  . Sexual activity: Not Currently    Partners: Male    Comment: pt. declined condoms  Lifestyle  . Physical activity:    Days per week: Not on file    Minutes per session: Not on file  . Stress: Not on file  Relationships  . Social connections:    Talks on phone: Not on file    Gets together: Not on file    Attends religious service: Not on file    Active member of club or organization: Not on file    Attends meetings of clubs or organizations: Not on file    Relationship status: Not on file  . Intimate partner violence:    Fear of current or ex partner: Not on file    Emotionally abused: Not on file    Physically abused: Not on file    Forced sexual activity: Not on file  Other Topics Concern  . Not on file  Social  History Narrative   Epworth Sleepiness Scale = 7 (as of 03/16/2015)   Review of Systems  Constitutional: Negative for chills and fever.  Skin: Positive for rash.      Objective:   Physical Exam  Constitutional: He is oriented to person, place, and time. He appears well-developed and well-nourished. No distress.  HENT:  Head: Normocephalic and atraumatic.  Eyes: Conjunctivae and EOM are normal.  Neck: Neck supple. No tracheal deviation present.  Cardiovascular: Normal rate.  Pulmonary/Chest: Effort normal. No respiratory distress.  Abdominal:  1 small pustule on lower abdominal wall with minimal surrounding erythema. Unroofed for culture.  Genitourinary:  Genitourinary Comments: R groin just above scrotum: 3 small papules with minimal induration with slight excoriated appearance over surface. Induration ~3-5 mm on each. No inguinal lymphadenopathy. Few small erythematous papules on L groin as well. No vesicles.  Musculoskeletal: Normal range of motion.  Lymphadenopathy: No inguinal adenopathy noted on the right or left side.  Neurological: He is alert and oriented to person, place, and time.  Skin: Skin is warm and dry. Rash noted. Rash is not vesicular.  Psychiatric: He has a normal mood and affect. His behavior is normal.  Nursing note and vitals reviewed.  Vitals:   07/09/17 0810  BP: 140/86  Pulse: 97  Temp: 98.3 F (36.8 C)  TempSrc: Oral  SpO2: 97%  Weight: 246 lb 9.6 oz (111.9 kg)  Height: 5' 11"  (1.803 m)      Assessment & Plan:    VINAYAK BOBIER is a 62 y.o. male Folliculitis - Plan: WOUND CULTURE, doxycycline (VIBRA-TABS) 100 MG tablet To areas of suspected folliculitis, upper lesion on the abdominal wall was unroofed with culture obtained.  Other areas with minimal induration, but no discharge or central fluctuance.  -Start doxycycline, warm compresses, RTC precautions if not improving or spread of rash  Meds  ordered this encounter  Medications  .  doxycycline (VIBRA-TABS) 100 MG tablet    Sig: Take 1 tablet (100 mg total) by mouth 2 (two) times daily.    Dispense:  20 tablet    Refill:  0   Patient Instructions    Bumps appear to be a folliculitis, possible skin infection.  At this time I do not think opening them is needed, but I did do a culture of the small pustule on your lower abdominal wall.  Start doxycycline 1 pill twice per day, warm compresses 3-4 times per day with gentle pressure afterwards. Cleanse area daily with soap and water.  If any worsening of rash, spread of rash, or not improving within the next week, return for recheck.   Folliculitis Folliculitis is inflammation of the hair follicles. Folliculitis most commonly occurs on the scalp, thighs, legs, back, and buttocks. However, it can occur anywhere on the body. What are the causes? This condition may be caused by:  A bacterial infection (common).  A fungal infection.  A viral infection.  Coming into contact with certain chemicals, especially oils and tars.  Shaving or waxing.  Applying greasy ointments or creams to your skin often.  Long-lasting folliculitis and folliculitis that keeps coming back can be caused by bacteria that live in the nostrils. What increases the risk? This condition is more likely to develop in people with:  A weakened immune system.  Diabetes.  Obesity.  What are the signs or symptoms? Symptoms of this condition include:  Redness.  Soreness.  Swelling.  Itching.  Small white or yellow, pus-filled, itchy spots (pustules) that appear over a reddened area. If there is an infection that goes deep into the follicle, these may develop into a boil (furuncle).  A group of closely packed boils (carbuncle). These tend to form in hairy, sweaty areas of the body.  How is this diagnosed? This condition is diagnosed with a skin exam. To find what is causing the condition, your health care provider may take a sample of one of  the pustules or boils for testing. How is this treated? This condition may be treated by:  Applying warm compresses to the affected areas.  Taking an antibiotic medicine or applying an antibiotic medicine to the skin.  Applying or bathing with an antiseptic solution.  Taking an over-the-counter medicine to help with itching.  Having a procedure to drain any pustules or boils. This may be done if a pustule or boil contains a lot of pus or fluid.  Laser hair removal. This may be done to treat long-lasting folliculitis.  Follow these instructions at home:  If directed, apply heat to the affected area as often as told by your health care provider. Use the heat source that your health care provider recommends, such as a moist heat pack or a heating pad. ? Place a towel between your skin and the heat source. ? Leave the heat on for 20-30 minutes. ? Remove the heat if your skin turns bright red. This is especially important if you are unable to feel pain, heat, or cold. You may have a greater risk of getting burned.  If you were prescribed an antibiotic medicine, use it as told by your health care provider. Do not stop using the antibiotic even if you start to feel better.  Take over-the-counter and prescription medicines only as told by your health care provider.  Do not shave irritated skin.  Keep all follow-up visits as told by your  health care provider. This is important. Get help right away if:  You have more redness, swelling, or pain in the affected area.  Red streaks are spreading from the affected area.  You have a fever. This information is not intended to replace advice given to you by your health care provider. Make sure you discuss any questions you have with your health care provider. Document Released: 04/21/2001 Document Revised: 08/31/2015 Document Reviewed: 12/01/2014 Elsevier Interactive Patient Education  2018 [employer].     IF you received an x-ray today,  you will receive an invoice from [facility]. Please contact [facility] at [phone] with questions or concerns regarding your invoice.   IF you received labwork today, you will receive an invoice from [facility]. Please contact [facility] at [phone] with questions or concerns regarding your invoice.   Our billing staff will not be able to assist you with questions regarding bills from these companies.  You will be contacted with the lab results as soon as they are available. The fastest way to get your results is to activate your My Chart account. Instructions are located on the last page of this paperwork. If you have not heard from [name] regarding the results in 2 weeks, please contact this office.       I personally performed the services described in this documentation, which was scribed in my presence. The recorded information has been reviewed and considered for accuracy and completeness, addended by me as needed, and agree with information above.  Signed,   [name], MD [facility].  07/09/17 8:33 AM

## 2017-07-09 NOTE — Patient Instructions (Addendum)
Bumps appear to be a folliculitis, possible skin infection.  At this time I do not think opening them is needed, but I did do a culture of the small pustule on your lower abdominal wall.  Start doxycycline 1 pill twice per day, warm compresses 3-4 times per day with gentle pressure afterwards. Cleanse area daily with soap and water.  If any worsening of rash, spread of rash, or not improving within the next week, return for recheck.   Folliculitis Folliculitis is inflammation of the hair follicles. Folliculitis most commonly occurs on the scalp, thighs, legs, back, and buttocks. However, it can occur anywhere on the body. What are the causes? This condition may be caused by:  A bacterial infection (common).  A fungal infection.  A viral infection.  Coming into contact with certain chemicals, especially oils and tars.  Shaving or waxing.  Applying greasy ointments or creams to your skin often.  Long-lasting folliculitis and folliculitis that keeps coming back can be caused by bacteria that live in the nostrils. What increases the risk? This condition is more likely to develop in people with:  A weakened immune system.  Diabetes.  Obesity.  What are the signs or symptoms? Symptoms of this condition include:  Redness.  Soreness.  Swelling.  Itching.  Small white or yellow, pus-filled, itchy spots (pustules) that appear over a reddened area. If there is an infection that goes deep into the follicle, these may develop into a boil (furuncle).  A group of closely packed boils (carbuncle). These tend to form in hairy, sweaty areas of the body.  How is this diagnosed? This condition is diagnosed with a skin exam. To find what is causing the condition, your health care provider may take a sample of one of the pustules or boils for testing. How is this treated? This condition may be treated by:  Applying warm compresses to the affected areas.  Taking an antibiotic medicine  or applying an antibiotic medicine to the skin.  Applying or bathing with an antiseptic solution.  Taking an over-the-counter medicine to help with itching.  Having a procedure to drain any pustules or boils. This may be done if a pustule or boil contains a lot of pus or fluid.  Laser hair removal. This may be done to treat long-lasting folliculitis.  Follow these instructions at home:  If directed, apply heat to the affected area as often as told by your health care provider. Use the heat source that your health care provider recommends, such as a moist heat pack or a heating pad. ? Place a towel between your skin and the heat source. ? Leave the heat on for 20-30 minutes. ? Remove the heat if your skin turns bright red. This is especially important if you are unable to feel pain, heat, or cold. You may have a greater risk of getting burned.  If you were prescribed an antibiotic medicine, use it as told by your health care provider. Do not stop using the antibiotic even if you start to feel better.  Take over-the-counter and prescription medicines only as told by your health care provider.  Do not shave irritated skin.  Keep all follow-up visits as told by your health care provider. This is important. Get help right away if:  You have more redness, swelling, or pain in the affected area.  Red streaks are spreading from the affected area.  You have a fever. This information is not intended to replace advice given to you by  your health care provider. Make sure you discuss any questions you have with your health care provider. Document Released: 04/21/2001 Document Revised: 08/31/2015 Document Reviewed: 12/01/2014 Elsevier Interactive Patient Education  2018 [employer].     IF you received an x-ray today, you will receive an invoice from [facility]. Please contact [facility] at [phone] with questions or concerns regarding your invoice.   IF you  received labwork today, you will receive an invoice from [facility]. Please contact [facility] at [phone] with questions or concerns regarding your invoice.   Our billing staff will not be able to assist you with questions regarding bills from these companies.  You will be contacted with the lab results as soon as they are available. The fastest way to get your results is to activate your My Chart account. Instructions are located on the last page of this paperwork. If you have not heard from [name] regarding the results in 2 weeks, please contact this office.

## 2017-07-11 ENCOUNTER — Other Ambulatory Visit: Payer: Self-pay | Admitting: Family Medicine

## 2017-07-12 LAB — WOUND CULTURE

## 2017-07-15 ENCOUNTER — Other Ambulatory Visit: Payer: Self-pay | Admitting: Internal Medicine

## 2017-07-15 ENCOUNTER — Other Ambulatory Visit: Payer: Self-pay | Admitting: Family Medicine

## 2017-07-15 ENCOUNTER — Encounter: Payer: Self-pay | Admitting: Endocrinology

## 2017-07-15 ENCOUNTER — Ambulatory Visit (INDEPENDENT_AMBULATORY_CARE_PROVIDER_SITE_OTHER): Payer: PPO | Admitting: Endocrinology

## 2017-07-15 VITALS — BP 148/78 | HR 97 | Wt 248.6 lb

## 2017-07-15 DIAGNOSIS — E0849 Diabetes mellitus due to underlying condition with other diabetic neurological complication: Secondary | ICD-10-CM

## 2017-07-15 DIAGNOSIS — I82409 Acute embolism and thrombosis of unspecified deep veins of unspecified lower extremity: Secondary | ICD-10-CM

## 2017-07-15 DIAGNOSIS — Z794 Long term (current) use of insulin: Secondary | ICD-10-CM | POA: Diagnosis not present

## 2017-07-15 DIAGNOSIS — M792 Neuralgia and neuritis, unspecified: Secondary | ICD-10-CM

## 2017-07-15 LAB — POCT GLYCOSYLATED HEMOGLOBIN (HGB A1C): Hemoglobin A1C: 8.9 % — AB (ref 4.0–5.6)

## 2017-07-15 MED ORDER — FREESTYLE LIBRE 14 DAY READER DEVI
1.0000 | Freq: Once | 0 refills | Status: DC
Start: 2017-07-15 — End: 2017-08-13

## 2017-07-15 MED ORDER — INSULIN GLARGINE 100 UNIT/ML SOLOSTAR PEN: 140.0000 [IU] | PEN_INJECTOR | SUBCUTANEOUS | 99 refills | Status: DC

## 2017-07-15 MED ORDER — INSULIN ASPART 100 UNIT/ML FLEXPEN: PEN_INJECTOR | SUBCUTANEOUS | 11 refills | Status: DC

## 2017-07-15 MED ORDER — FREESTYLE LIBRE 14 DAY SENSOR MISC: 1.0000 | 3 refills | Status: DC

## 2017-07-15 NOTE — Telephone Encounter (Signed)
Please advise on refill for patient.   Last seen 1/93/7902 on Folliculitis Last refilled 06/22/17. Only received a month supply with no refills.   Thanks, [employer]

## 2017-07-15 NOTE — Patient Instructions (Addendum)
check your blood sugar twice a day.  vary the time of day when you check, between before the 3 meals, and at bedtime.  also check if you have symptoms of your blood sugar being too high or too low.  please keep a record of the readings and bring it to your next appointment here (or you can bring the meter itself).  You can write it on any piece of paper.  please call us sooner if your blood sugar goes below 70, or if you have a lot of readings over 200.   For now, please:  increase the lantus to 140 units each morning, and:  reduce the novolog to 3 times a day (just before each meal) 12-04-18 units.  If you skip a meal, you should also skip the novolog.  I have sent a prescription to your pharmacy, for the "freestlye Libre" continuous glucose monitor.    Please come back for a follow-up appointment in 2 months.

## 2017-07-15 NOTE — Progress Notes (Signed)
Subjective:    Patient ID: Daniel [name], male    DOB: 1955/10/25, 62 y.o.   MRN: [id number]  HPI Pt returns for f/u of diabetes mellitus: DM type: Insulin-requiring type 2.  Dx'ed: 0712 Complications: polyneuropathy, PAD, renal insufficiency, and foot ulcers.  Therapy: insulin since 2016. DKA: never Severe hypoglycemia: never.  Pancreatitis: never.   Other: he takes multiple daily injections, but basal insulin is emphasized.   Interval history: he seldom checks cbg.  He thinks he had an episode of mild hypoglycemia yesterday afternoon, but, did not check cbg then.  He requests continuous glucose monitor.  He often forgets insulin.   Past Medical History:  Diagnosis Date  . ADHD (attention deficit hyperactivity disorder)   . Anxiety   . Chronic kidney disease   . Clotting disorder ([place])   . Depression   . Diabetes mellitus without complication ([place])   . Diabetes mellitus, type II ([place])   . Dizziness 03/17/2015  . GERD (gastroesophageal reflux disease)   . HIV disease ([place])   . HIV infection ([place])   . Liver disease   . OSA (obstructive sleep apnea) 07/25/2015   Uses CPAP regularly  . Peripheral vascular disease ([place])   . Ulcer     Past Surgical History:  Procedure Laterality Date  . SMALL INTESTINE SURGERY    . STOMACH SURGERY    . TOE AMPUTATION Right 08/2016   right great toe    Social History   Socioeconomic History  . Marital status: Single    Spouse name: Not on file  . Number of children: Not on file  . Years of education: Not on file  . Highest education level: Not on file  Occupational History  . Not on file  Social Needs  . Financial resource strain: Not on file  . Food insecurity:    Worry: Not on file    Inability: Not on file  . Transportation needs:    Medical: Not on file    Non-medical: Not on file  Tobacco Use  . Smoking status: Former Smoker    Packs/day: 0.10    Years: 10.00    Pack years: 1.00    Types: Cigars    Last attempt to  quit: 08/09/2014    Years since quitting: 2.9  . Smokeless tobacco: Never Used  Substance and Sexual Activity  . Alcohol use: No    Alcohol/week: 0.0 oz  . Drug use: No  . Sexual activity: Not Currently    Partners: Male    Comment: pt. declined condoms  Lifestyle  . Physical activity:    Days per week: Not on file    Minutes per session: Not on file  . Stress: Not on file  Relationships  . Social connections:    Talks on phone: Not on file    Gets together: Not on file    Attends religious service: Not on file    Active member of club or organization: Not on file    Attends meetings of clubs or organizations: Not on file    Relationship status: Not on file  . Intimate partner violence:    Fear of current or ex partner: Not on file    Emotionally abused: Not on file    Physically abused: Not on file    Forced sexual activity: Not on file  Other Topics Concern  . Not on file  Social History Narrative   Epworth Sleepiness Scale = 7 (as of 03/16/2015)  Current Outpatient Medications on File Prior to Visit  Medication Sig Dispense Refill  . abacavir-dolutegravir-lamiVUDine (TRIUMEQ) 600-50-300 MG tablet Take 1 tablet by mouth daily. 30 tablet 6  . acetaminophen (TYLENOL) 500 MG tablet Take 1,000 mg by mouth every 8 (eight) hours as needed for moderate pain.     [name] ALPRAZolam (XANAX) 1 MG tablet Take 1 mg by mouth 2 (two) times daily.     [name] amphetamine-dextroamphetamine (ADDERALL) 30 MG tablet Take 30 mg by mouth 2 (two) times daily.     [name] atorvastatin (LIPITOR) 10 MG tablet Take 1 tablet (10 mg total) by mouth daily. 90 tablet 0  . diclofenac sodium (VOLTAREN) 1 % GEL APPLY 2 GRAMS TO EACH KNEE IN THE MORNING AND AT BEDTIME AND APPLY 1 GRAM TO EACH KNEE IN THE AFTERNOON. 300 g 0  . divalproex (DEPAKOTE ER) 500 MG 24 hr tablet TAKE (1) TABLET BY MOUTH AT BEDTIME. 90 tablet 2  . doxycycline (VIBRA-TABS) 100 MG tablet Take 1 tablet (100 mg total) by mouth 2 (two) times daily. 20  tablet 0  . Insulin Pen Needle (SURE COMFORT PEN NEEDLES) 31G X 8 MM MISC USE TO INJECT LANTUS AND NOVOLOG DAILY. 100 each 0  . levothyroxine (SYNTHROID, LEVOTHROID) 50 MCG tablet Take 1 tablet (50 mcg total) by mouth at bedtime. 30 tablet 11  . Multiple Vitamins-Minerals (ONE-A-DAY MENS 50+ ADVANTAGE PO) Take by mouth.    . pregabalin (LYRICA) 100 MG capsule Take 1 capsule (100 mg total) by mouth 3 (three) times daily. 90 capsule 0  . protriptyline (VIVACTIL) 10 MG tablet Take 10 mg by mouth 3 (three) times daily.   11  . ranitidine (ZANTAC) 150 MG tablet Take 150 mg by mouth daily as needed.     [name] REXULTI 1 MG TABS   98  . Rivaroxaban (XARELTO) 15 MG TABS tablet Take 1 tablet (15 mg total) by mouth daily. Pt. Needs office visit for INR /PT. 30 tablet 0  . terbinafine (LAMISIL) 250 MG tablet Take one tablet daily for 1 week out of each month 42 tablet 0  . TRINTELLIX 20 MG TABS Take 20 mg by mouth at bedtime.    [name] UNABLE TO FIND CPAP MACHINE with standard Aclaim nasal mask with humidifier. Set at 14 cwp (Patient taking differently: CPAP MACHINE with standard Aclaim nasal mask with humidifier. Set at 4 cwp) 1 each 0  . zolpidem (AMBIEN) 10 MG tablet Take 10 mg by mouth at bedtime as needed for sleep.     [name] amoxicillin-clavulanate (AUGMENTIN) 875-125 MG tablet Take 1 tablet by mouth 2 (two) times daily. (Patient not taking: Reported on 07/15/2017) 20 tablet 0   No current facility-administered medications on file prior to visit.     Allergies  Allergen Reactions  . Aspirin Swelling  . Ibuprofen Swelling  . Sustiva [Efavirenz] Swelling and Rash    And rash.  . Nsaids Other (See Comments)    unknwn    Family History  Problem Relation Age of Onset  . Depression Brother   . Throat cancer Brother        half brother, never smoker  . COPD Mother   . Diabetes Neg Hx     BP (!) 148/78   Pulse 97   Wt 248 lb 9.6 oz (112.8 kg)   SpO2 97%   BMI 34.67 kg/m    Review of Systems He  denies hypoglycemia    Objective:   Physical Exam VITAL SIGNS:  See vs page.  GENERAL: no distress. Pulses: foot pulses are intact bilaterally.   MSK: no deformity of the feet or ankles, except the right great toe is surgically absent.  CV: 1+ bilat edema of the legs  Skin: Right foot ulcer is healed.  normal color and temp on the feet and ankles.  Neuro: sensation is intact to touch on the feet and ankles, but decreased from normal.  There is bilateral onychomycosis of the toenails.    Lab Results  Component Value Date   HGBA1C 8.9 (A) 07/15/2017      Assessment & Plan:  Insulin-requiring type 2 DM, with renal insuff: she needs increased rx.  Hypoglycemia: this limits aggressiveness of glycemic control.  Noncompliance with cbg recording and insulin.  Plan will be to emphasize the basal insulin.   Patient Instructions  check your blood sugar twice a day.  vary the time of day when you check, between before the 3 meals, and at bedtime.  also check if you have symptoms of your blood sugar being too high or too low.  please keep a record of the readings and bring it to your next appointment here (or you can bring the meter itself).  You can write it on any piece of paper.  please call us sooner if your blood sugar goes below 70, or if you have a lot of readings over 200.   For now, please:  increase the lantus to 140 units each morning, and:  reduce the novolog to 3 times a day (just before each meal) 12-04-18 units.  If you skip a meal, you should also skip the novolog.  I have sent a prescription to your pharmacy, for the "freestlye Libre" continuous glucose monitor.    Please come back for a follow-up appointment in 2 months.

## 2017-07-16 ENCOUNTER — Telehealth: Payer: Self-pay | Admitting: Family Medicine

## 2017-07-16 NOTE — Telephone Encounter (Signed)
Copied from [place] [id number]. Topic: Quick Communication - See Telephone Encounter >> Jul 16, 2017  3:23 PM Cleaster Corin, [place] wrote: CRM for notification. See Telephone encounter for: 07/16/17.  Pt. Stating that he received a call from the office but no notes put in.

## 2017-07-22 NOTE — Telephone Encounter (Signed)
I thought this had already been sent in.  I refilled today, but see if he needs a short term supply until this refill processes from [facility]. Thanks.   -JG

## 2017-07-27 ENCOUNTER — Telehealth: Payer: Self-pay | Admitting: Endocrinology

## 2017-07-27 NOTE — Telephone Encounter (Signed)
Pharmacy -[facility] calling re: request for for RX for Freestyle Precision Neo Test Strips for the FreeStyle Libre Meter. If questions call PHARM at ph# [phone]

## 2017-07-28 ENCOUNTER — Other Ambulatory Visit: Payer: Self-pay

## 2017-07-28 ENCOUNTER — Ambulatory Visit: Payer: BLUE CROSS/BLUE SHIELD | Admitting: Sports Medicine

## 2017-07-28 MED ORDER — GLUCOSE BLOOD VI STRP: ORAL_STRIP | 12 refills | Status: DC

## 2017-07-28 NOTE — Telephone Encounter (Signed)
I have sent to [facility].

## 2017-08-03 DIAGNOSIS — L918 Other hypertrophic disorders of the skin: Secondary | ICD-10-CM | POA: Diagnosis not present

## 2017-08-03 DIAGNOSIS — L821 Other seborrheic keratosis: Secondary | ICD-10-CM | POA: Diagnosis not present

## 2017-08-11 ENCOUNTER — Encounter: Payer: Self-pay | Admitting: Sports Medicine

## 2017-08-11 ENCOUNTER — Ambulatory Visit: Payer: PPO | Admitting: Sports Medicine

## 2017-08-11 DIAGNOSIS — M79674 Pain in right toe(s): Secondary | ICD-10-CM

## 2017-08-11 DIAGNOSIS — L84 Corns and callosities: Secondary | ICD-10-CM | POA: Diagnosis not present

## 2017-08-11 DIAGNOSIS — E1140 Type 2 diabetes mellitus with diabetic neuropathy, unspecified: Secondary | ICD-10-CM

## 2017-08-11 DIAGNOSIS — B351 Tinea unguium: Secondary | ICD-10-CM

## 2017-08-11 DIAGNOSIS — M79675 Pain in left toe(s): Secondary | ICD-10-CM

## 2017-08-11 DIAGNOSIS — Z89411 Acquired absence of right great toe: Secondary | ICD-10-CM | POA: Diagnosis not present

## 2017-08-11 DIAGNOSIS — E1149 Type 2 diabetes mellitus with other diabetic neurological complication: Secondary | ICD-10-CM

## 2017-08-11 NOTE — Progress Notes (Signed)
Subjective: Daniel [name] is a 62 y.o. male patient seen today in office for follow up evaluation of Left great toe callus and for diabetic nail trim. Patient states that he feels fine. Denies swelling, redness, warmth at left great toe. Patient denies any other issues.   FBS 303 right now.  Patient Active Problem List   Diagnosis Date Noted  . Morbid obesity due to excess calories ([place]) complicated by DM / hyperlipidemia 01/15/2017  . DOE (dyspnea on exertion) 01/13/2017  . Confusion 09/17/2016  . Chronic migraine 09/17/2016  . Gait abnormality 09/17/2016  . Diabetic foot infection ([place]) 06/28/2016  . Screening examination for venereal disease 02/11/2016  . Critical lower limb ischemia 12/07/2015  . Fall 12/05/2015  . Toe ulcer, right ([place]) 09/19/2015  . Decreased pedal pulses 09/19/2015  . OSA on CPAP 09/05/2015  . Major depressive disorder, recurrent episode, moderate ([place]) 09/05/2015  . OSA (obstructive sleep apnea) 07/25/2015  . Low back pain 06/11/2015  . Abnormality of gait 06/11/2015  . Dizziness 03/17/2015  . Weakness 02/21/2015  . Chronic renal insufficiency, stage III (moderate) ([place]) 08/09/2014  . Diabetes ([place]) 11/07/2013  . Hematuria 06/21/2013  . Hepatic steatosis 09/09/2010  . Human immunodeficiency virus (HIV) disease ([place]) 06/04/2006  . HERPES ZOSTER, UNCOMPLICATED 25/85/2778  . THROMBOPHLEBITIS NOS 06/04/2006  . GERD 06/04/2006  . ARTHRITIS, HAND 06/04/2006    Current Outpatient Medications on File Prior to Visit  Medication Sig Dispense Refill  . abacavir-dolutegravir-lamiVUDine (TRIUMEQ) 600-50-300 MG tablet Take 1 tablet by mouth daily. 30 tablet 6  . acetaminophen (TYLENOL) 500 MG tablet Take 1,000 mg by mouth every 8 (eight) hours as needed for moderate pain.     [name] ALPRAZolam (XANAX) 1 MG tablet Take 1 mg by mouth 2 (two) times daily.     [name] amoxicillin-clavulanate (AUGMENTIN) 875-125 MG tablet Take 1 tablet by mouth 2 (two) times daily. 20 tablet 0   . amphetamine-dextroamphetamine (ADDERALL) 30 MG tablet Take 30 mg by mouth 2 (two) times daily.     [name] atorvastatin (LIPITOR) 10 MG tablet Take 1 tablet (10 mg total) by mouth daily. 90 tablet 0  . Continuous Blood Gluc Sensor (FREESTYLE LIBRE 14 DAY SENSOR) MISC 1 Device by Does not apply route every 14 (fourteen) days. 6 each 3  . diclofenac sodium (VOLTAREN) 1 % GEL APPLY 2 GRAMS TO EACH KNEE IN THE MORNING AND AT BEDTIME AND APPLY 1 GRAM TO EACH KNEE IN THE AFTERNOON. 300 g 0  . divalproex (DEPAKOTE ER) 500 MG 24 hr tablet TAKE (1) TABLET BY MOUTH AT BEDTIME. 90 tablet 2  . doxycycline (VIBRA-TABS) 100 MG tablet Take 1 tablet (100 mg total) by mouth 2 (two) times daily. 20 tablet 0  . glucose blood (FREESTYLE PRECISION NEO TEST) test strip Used to check blood sugars twice daily. 100 each 12  . insulin aspart (NOVOLOG FLEXPEN) 100 UNIT/ML FlexPen 3 times a day (just before each meal) 12-04-18 units 30 mL 11  . Insulin Glargine (LANTUS SOLOSTAR) 100 UNIT/ML Solostar Pen Inject 140 Units into the skin every morning. 20 pen PRN  . Insulin Pen Needle (SURE COMFORT PEN NEEDLES) 31G X 8 MM MISC USE TO INJECT LANTUS AND NOVOLOG DAILY. 100 each 0  . levothyroxine (SYNTHROID, LEVOTHROID) 50 MCG tablet Take 1 tablet (50 mcg total) by mouth at bedtime. 30 tablet 11  . LYRICA 100 MG capsule TAKE ONE CAPSULE BY MOUTH THREE TIMES DAILY 90 capsule 0  . Multiple Vitamins-Minerals (ONE-A-DAY MENS 50+  ADVANTAGE PO) Take by mouth.    . ondansetron (ZOFRAN-ODT) 4 MG disintegrating tablet DISSOLVE 2 TABLETS UNDER THE TONGUE EVERY 8 HOURS AS NEEDED 60 tablet 0  . protriptyline (VIVACTIL) 10 MG tablet Take 10 mg by mouth 3 (three) times daily.   11  . ranitidine (ZANTAC) 150 MG tablet Take 150 mg by mouth daily as needed.     [name] REXULTI 1 MG TABS   98  . terbinafine (LAMISIL) 250 MG tablet Take one tablet daily for 1 week out of each month 42 tablet 0  . TRINTELLIX 20 MG TABS Take 20 mg by mouth at bedtime.    [name]  UNABLE TO FIND CPAP MACHINE with standard Aclaim nasal mask with humidifier. Set at 14 cwp (Patient taking differently: CPAP MACHINE with standard Aclaim nasal mask with humidifier. Set at 4 cwp) 1 each 0  . XARELTO 15 MG TABS tablet TAKE 1 TABLET BY MOUTH EVERY DAY 30 tablet 0  . zolpidem (AMBIEN) 10 MG tablet Take 10 mg by mouth at bedtime as needed for sleep.      No current facility-administered medications on file prior to visit.     Allergies  Allergen Reactions  . Aspirin Swelling  . Ibuprofen Swelling  . Sustiva [Efavirenz] Swelling and Rash    And rash.  . Nsaids Other (See Comments)    unknwn    Objective: There were no vitals filed for this visit.  General: No acute distress, AAOx3   Right foot: Hallux amp site healed, nails 2-5 on right are long, thick, yellow and mycotic with no signs of infection and proximal clearing, Capillary fill time <5 seconds in all remaining toes on right, protective sensation diminished on right, No pain or crepitation with range of motion right foot. No pain with calf compression.   Left foot: Pre-ulcerative callus left hallux with dry blood at left great toe medial aspect with no signs of infection noted. Nails 1-5 on left are thick yellow and mycotic with proximal clearing and no signs of infection, No other acute findings.  Pes planus foot type bilateral. Drop foot gait on right after amputation.   Assessment and Plan:  Problem List Items Addressed This Visit    None    Visit Diagnoses    Pain due to onychomycosis of toenails of both feet    -  Primary   Pre-ulcerative calluses       Diabetic neuropathy with neurologic complication ([place])       Status post amputation of right great toe ([place])         -Patient seen and evaluated -Recommend daily monitoring of feet and encouraged glycemic control -Mechanically debrided callus using sterile chisel blade at left great toe; advised close monitoring and to prevent shoes that can rub 1st toe  on left -Debrided nails x 9 using sterile nail nipper without incident and smoothed with rotary bur  -Advised patient to elevate and continue with compression socks for edema control  -Will plan for routine diabetic foot care at next visit. In the meantime, patient to call office if any issues or problems arise.   Landis Martins, DPM

## 2017-08-12 ENCOUNTER — Other Ambulatory Visit: Payer: Self-pay | Admitting: Family Medicine

## 2017-08-12 DIAGNOSIS — I82409 Acute embolism and thrombosis of unspecified deep veins of unspecified lower extremity: Secondary | ICD-10-CM

## 2017-08-12 DIAGNOSIS — M792 Neuralgia and neuritis, unspecified: Secondary | ICD-10-CM

## 2017-08-13 ENCOUNTER — Other Ambulatory Visit: Payer: Self-pay | Admitting: Endocrinology

## 2017-08-13 ENCOUNTER — Other Ambulatory Visit: Payer: Self-pay | Admitting: Internal Medicine

## 2017-08-13 ENCOUNTER — Other Ambulatory Visit: Payer: Self-pay | Admitting: Family Medicine

## 2017-08-13 DIAGNOSIS — M792 Neuralgia and neuritis, unspecified: Secondary | ICD-10-CM

## 2017-08-13 DIAGNOSIS — I82409 Acute embolism and thrombosis of unspecified deep veins of unspecified lower extremity: Secondary | ICD-10-CM

## 2017-08-13 NOTE — Telephone Encounter (Signed)
Refill of Lyrica  LRF 07/22/17  #90  0 refills    Refill of Xarelo  LRF 07/22/17  #90  0 refills   LOV 07/09/17  Dr. Carlota Raspberry

## 2017-08-14 NOTE — Telephone Encounter (Signed)
Lyrica last filled on 07/22/17 #90 LOV: 04/21/17 Dr. Carlota Raspberry

## 2017-08-15 NOTE — Telephone Encounter (Signed)
Refill for lyrica?  LOV 07/09/2017 Last refilled 07/22/2017 with 90 tabs sig to 1 tab TID

## 2017-08-16 NOTE — Telephone Encounter (Signed)
Refilled lyrica.

## 2017-08-19 DIAGNOSIS — E119 Type 2 diabetes mellitus without complications: Secondary | ICD-10-CM | POA: Diagnosis not present

## 2017-08-19 DIAGNOSIS — H2513 Age-related nuclear cataract, bilateral: Secondary | ICD-10-CM | POA: Diagnosis not present

## 2017-08-19 DIAGNOSIS — H5203 Hypermetropia, bilateral: Secondary | ICD-10-CM | POA: Diagnosis not present

## 2017-08-19 DIAGNOSIS — Z794 Long term (current) use of insulin: Secondary | ICD-10-CM | POA: Diagnosis not present

## 2017-08-19 LAB — HM DIABETES EYE EXAM

## 2017-08-24 ENCOUNTER — Telehealth: Payer: Self-pay | Admitting: Family Medicine

## 2017-08-24 NOTE — Telephone Encounter (Signed)
Called patient offered him appointment with someone other than greene patient declined and hung up

## 2017-08-24 NOTE — Telephone Encounter (Signed)
Copied from [place] [id number]. Topic: General - Other >> Aug 24, 2017 10:59 AM Judyann Munson wrote: Reason for CRM: Patient is calling to see if Dr.Green can send over something for him, he stated he has Bell's Palsy, advise patient Dr.Green schedule is full until 7-8. Please advise

## 2017-08-26 DIAGNOSIS — L739 Follicular disorder, unspecified: Secondary | ICD-10-CM | POA: Diagnosis not present

## 2017-08-26 DIAGNOSIS — Z6834 Body mass index (BMI) 34.0-34.9, adult: Secondary | ICD-10-CM | POA: Diagnosis not present

## 2017-08-26 DIAGNOSIS — G510 Bell's palsy: Secondary | ICD-10-CM | POA: Diagnosis not present

## 2017-09-01 ENCOUNTER — Encounter: Payer: Self-pay | Admitting: Sports Medicine

## 2017-09-01 ENCOUNTER — Ambulatory Visit: Payer: PPO | Admitting: Sports Medicine

## 2017-09-01 DIAGNOSIS — E1149 Type 2 diabetes mellitus with other diabetic neurological complication: Secondary | ICD-10-CM | POA: Diagnosis not present

## 2017-09-01 DIAGNOSIS — Z89411 Acquired absence of right great toe: Secondary | ICD-10-CM

## 2017-09-01 DIAGNOSIS — E1140 Type 2 diabetes mellitus with diabetic neuropathy, unspecified: Secondary | ICD-10-CM

## 2017-09-01 DIAGNOSIS — T148XXA Other injury of unspecified body region, initial encounter: Secondary | ICD-10-CM | POA: Diagnosis not present

## 2017-09-01 NOTE — Progress Notes (Signed)
Subjective: Daniel [name] is a 62 y.o. male patient seen today in office for evaluation of right foot blood blister that he noticed it 2 days ago and applied betadine. Patient denies any other issues.   FBS elevated   Patient Active Problem List   Diagnosis Date Noted  . Morbid obesity due to excess calories ([place]) complicated by DM / hyperlipidemia 01/15/2017  . DOE (dyspnea on exertion) 01/13/2017  . Confusion 09/17/2016  . Chronic migraine 09/17/2016  . Gait abnormality 09/17/2016  . Diabetic foot infection ([place]) 06/28/2016  . Screening examination for venereal disease 02/11/2016  . Critical lower limb ischemia 12/07/2015  . Fall 12/05/2015  . Toe ulcer, right ([place]) 09/19/2015  . Decreased pedal pulses 09/19/2015  . OSA on CPAP 09/05/2015  . Major depressive disorder, recurrent episode, moderate ([place]) 09/05/2015  . OSA (obstructive sleep apnea) 07/25/2015  . Low back pain 06/11/2015  . Abnormality of gait 06/11/2015  . Dizziness 03/17/2015  . Weakness 02/21/2015  . Chronic renal insufficiency, stage III (moderate) () 08/09/2014  . Diabetes ([place]) 11/07/2013  . Hematuria 06/21/2013  . Hepatic steatosis 09/09/2010  . Human immunodeficiency virus (HIV) disease ([place]) 06/04/2006  . HERPES ZOSTER, UNCOMPLICATED 03/04/3233  . THROMBOPHLEBITIS NOS 06/04/2006  . GERD 06/04/2006  . ARTHRITIS, HAND 06/04/2006    Current Outpatient Medications on File Prior to Visit  Medication Sig Dispense Refill  . abacavir-dolutegravir-lamiVUDine (TRIUMEQ) 600-50-300 MG tablet Take 1 tablet by mouth daily. 30 tablet 6  . acetaminophen (TYLENOL) 500 MG tablet Take 1,000 mg by mouth every 8 (eight) hours as needed for moderate pain.     [name] ALPRAZolam (XANAX) 1 MG tablet Take 1 mg by mouth 2 (two) times daily.     [name] amoxicillin-clavulanate (AUGMENTIN) 875-125 MG tablet Take 1 tablet by mouth 2 (two) times daily. 20 tablet 0  . amphetamine-dextroamphetamine (ADDERALL) 30 MG tablet Take 30 mg by mouth  2 (two) times daily.     [name] atorvastatin (LIPITOR) 10 MG tablet Take 1 tablet (10 mg total) by mouth daily. 90 tablet 0  . Continuous Blood Gluc Receiver (FREESTYLE LIBRE 14 DAY READER) DEVI AS DIRECTED 1 Device 0  . Continuous Blood Gluc Sensor (FREESTYLE LIBRE 14 DAY SENSOR) MISC 1 Device by Does not apply route every 14 (fourteen) days. 6 each 3  . diclofenac sodium (VOLTAREN) 1 % GEL APPLY 2 GRAMS TO EACH KNEE IN THE MORNING AND AT BEDTIME AND APPLY 1 GRAM TO EACH KNEE IN THE AFTERNOON. 300 g 0  . divalproex (DEPAKOTE ER) 500 MG 24 hr tablet TAKE (1) TABLET BY MOUTH AT BEDTIME. 90 tablet 2  . doxycycline (VIBRA-TABS) 100 MG tablet Take 1 tablet (100 mg total) by mouth 2 (two) times daily. 20 tablet 0  . glucose blood (FREESTYLE PRECISION NEO TEST) test strip Used to check blood sugars twice daily. 100 each 12  . insulin aspart (NOVOLOG FLEXPEN) 100 UNIT/ML FlexPen 3 times a day (just before each meal) 12-04-18 units 30 mL 11  . Insulin Glargine (LANTUS SOLOSTAR) 100 UNIT/ML Solostar Pen Inject 140 Units into the skin every morning. 20 pen PRN  . Insulin Pen Needle (SURE COMFORT PEN NEEDLES) 31G X 8 MM MISC USE TO INJECT LANTUS AND NOVOLOG DAILY. 100 each 0  . levothyroxine (SYNTHROID, LEVOTHROID) 50 MCG tablet Take 1 tablet (50 mcg total) by mouth at bedtime. 30 tablet 11  . LYRICA 100 MG capsule TAKE 1 CAPSULE BY MOUTH THREE TIMES DAILY 90 capsule 0  .  Multiple Vitamins-Minerals (ONE-A-DAY MENS 50+ ADVANTAGE PO) Take by mouth.    . ondansetron (ZOFRAN-ODT) 4 MG disintegrating tablet DISSOLVE 2 TABLETS UNDER THE TONGUE EVERY 8 HOURS AS NEEDED 60 tablet 0  . protriptyline (VIVACTIL) 10 MG tablet Take 10 mg by mouth 3 (three) times daily.   11  . ranitidine (ZANTAC) 150 MG tablet Take 150 mg by mouth daily as needed.     [name] REXULTI 1 MG TABS   98  . terbinafine (LAMISIL) 250 MG tablet Take one tablet daily for 1 week out of each month 42 tablet 0  . TRINTELLIX 20 MG TABS Take 20 mg by mouth at  bedtime.    [name] UNABLE TO FIND CPAP MACHINE with standard Aclaim nasal mask with humidifier. Set at 14 cwp (Patient taking differently: CPAP MACHINE with standard Aclaim nasal mask with humidifier. Set at 4 cwp) 1 each 0  . XARELTO 15 MG TABS tablet TAKE 1 TABLET BY MOUTH EVERY DAY 30 tablet 0  . XARELTO 15 MG TABS tablet TAKE 1 TABLET BY MOUTH EVERY DAY 30 tablet 0  . zolpidem (AMBIEN) 10 MG tablet Take 10 mg by mouth at bedtime as needed for sleep.      No current facility-administered medications on file prior to visit.     Allergies  Allergen Reactions  . Aspirin Swelling  . Ibuprofen Swelling  . Sustiva [Efavirenz] Swelling and Rash    And rash.  . Nsaids Other (See Comments)    unknwn    Objective: There were no vitals filed for this visit.  General: No acute distress, AAOx3   Right foot: + blood blister plantar right forefoot with no surrounding warmth, erythema or edema, nails short and thick, Capillary fill time <5 seconds in all remaining toes on right, protective sensation diminished on right, No pain or crepitation with range of motion right foot. S/p R hallux amputation. No pain with calf compression.   Left foot: No acute findings.  Assessment and Plan:  Problem List Items Addressed This Visit    None    Visit Diagnoses    Blood blister    -  Primary   Diabetic neuropathy with neurologic complication ([place])       Status post amputation of right great toe ([place])         -Patient seen and evaluated -Discussed treatment options for blister -After sterile betadine prep, lanced blood blisters removing bloody fluid and dressed with betadine dressing. Patient tolerated the I&D procedure without complication -Continue with Doxycline and betadine dressings daily -Advised to monitor for worsening signs of infection if occurs return to office or ER -Will plan for blister check and xrays if still an issue on right at next visit. In the meantime, patient to call office if any  issues or problems arise.   Landis Martins, DPM

## 2017-09-03 ENCOUNTER — Encounter: Payer: Self-pay | Admitting: Family Medicine

## 2017-09-03 ENCOUNTER — Ambulatory Visit (INDEPENDENT_AMBULATORY_CARE_PROVIDER_SITE_OTHER): Payer: PPO | Admitting: Family Medicine

## 2017-09-03 VITALS — BP 128/77 | HR 98 | Temp 97.9°F | Resp 18 | Ht 71.0 in | Wt 248.0 lb

## 2017-09-03 DIAGNOSIS — R29810 Facial weakness: Secondary | ICD-10-CM

## 2017-09-03 DIAGNOSIS — E785 Hyperlipidemia, unspecified: Secondary | ICD-10-CM

## 2017-09-03 DIAGNOSIS — G510 Bell's palsy: Secondary | ICD-10-CM | POA: Diagnosis not present

## 2017-09-03 DIAGNOSIS — H538 Other visual disturbances: Secondary | ICD-10-CM

## 2017-09-03 NOTE — Progress Notes (Addendum)
Subjective:  By signing my name below, I, Daniel [name], attest that this documentation has been prepared under the direction and in the presence of Daniel Ray, MD. Electronically Signed: [name], [place]. 09/03/2017 , 6:29 PM .  Patient was seen in Room 11 .   Patient ID: Daniel [name], male    DOB: 1955-03-13, 62 y.o.   MRN: [id number] Chief Complaint  Patient presents with  . bells palsy    one week follow up  . Hyperlipidemia    check up on his cholesterol   HPI Daniel [name] is a 62 y.o. male Here for follow up from urgent care visit. He initially called on July 1st with concerns of possible Bell's Palsy and request for medication. Appears he was offered an appointment with another provider but he had declined. He was seen at July 3rd at [facility] urgent care at [facility], noted to have right facial droop and weakness, as well as inability to close eye x1 day. He reported history of Bell's Palsy, and felt similar to previous symptoms. He was unable to move his forehead or any of his right face, with minimal eye movement. Sensation was intact; noted to have right facial droop. He was diagnosed with Bell's Palsy, and treated with prednisone 60 mg x5 days, then taper, Valtrex 1 g tid for 1 week, doxycycline 100 mg bid for reported folliculitis of groin, and Lacri-Lube for eye care. Of note, last neuro imaging was CT head in May 2018 with stable brain atrophy without acute intracranial abnormality. Last brain MRI was done in March 2017, negative for acute infarct; however did have small hyperintensity right pons compatible with chronic ischemia.   Patient states he noticed facial droop with mouth not working on July 1st. He also noticed his right eye was unable to close. He denies any other weaknesses in his arms and his legs. He reports his mouth has gotten worse since July 3rd visit. He denies any new headaches. He noticed blurry vision of his right eye today. He mentions seeing  his ophthalmologist, Dr. Gershon Crane, on June 26th. He is not fasting today.   He's been checking his [name] sugars, running up to 400s. He denies changing his insulin while on prednisone. Home [name] sugar readings, today 114-152, yesterday 115-152, and 2 days ago 176-389. He has an appointment with [name] in 2 weeks.   Hyperlipidemia He requests repeat testing.   Lab Results  Component Value Date   CHOL 164 06/05/2016   HDL 37 (L) 06/05/2016   LDLCALC 56 06/05/2016   TRIG 356 (H) 06/05/2016   CHOLHDL 4.4 06/05/2016   Lab Results  Component Value Date   ALT 20 12/11/2016   AST 18 12/11/2016   ALKPHOS 139 (H) 12/11/2016   BILITOT 0.7 12/11/2016   He was previously taking Lipitor, but he had stopped on July 2nd due to muscle pain in his legs. After stop taking his Lipitor, his leg pain had improved about 90%.   Patient Active Problem List   Diagnosis Date Noted  . Morbid obesity due to excess calories ([place]) complicated by DM / hyperlipidemia 01/15/2017  . DOE (dyspnea on exertion) 01/13/2017  . Confusion 09/17/2016  . Chronic migraine 09/17/2016  . Gait abnormality 09/17/2016  . Diabetic foot infection ([place]) 06/28/2016  . Screening examination for venereal disease 02/11/2016  . Critical lower limb ischemia 12/07/2015  . Fall 12/05/2015  . Toe ulcer, right ([place]) 09/19/2015  . Decreased pedal pulses 09/19/2015  .  OSA on CPAP 09/05/2015  . Major depressive disorder, recurrent episode, moderate ([place]) 09/05/2015  . OSA (obstructive sleep apnea) 07/25/2015  . Low back pain 06/11/2015  . Abnormality of gait 06/11/2015  . Dizziness 03/17/2015  . Weakness 02/21/2015  . Chronic renal insufficiency, stage III (moderate) ([place]) 08/09/2014  . Diabetes ([place]) 11/07/2013  . Hematuria 06/21/2013  . Hepatic steatosis 09/09/2010  . Human immunodeficiency virus (HIV) disease ([place]) 06/04/2006  . HERPES ZOSTER, UNCOMPLICATED 63/87/5643  . THROMBOPHLEBITIS NOS 06/04/2006  . GERD 06/04/2006    . ARTHRITIS, HAND 06/04/2006   Past Medical History:  Diagnosis Date  . ADHD (attention deficit hyperactivity disorder)   . Anxiety   . Chronic kidney disease   . Clotting disorder ([place])   . Depression   . Diabetes mellitus without complication ([place])   . Diabetes mellitus, type II ([place])   . Dizziness 03/17/2015  . GERD (gastroesophageal reflux disease)   . HIV disease ([place])   . HIV infection ([place])   . Liver disease   . OSA (obstructive sleep apnea) 07/25/2015   Uses CPAP regularly  . Peripheral vascular disease ([place])   . Ulcer    Past Surgical History:  Procedure Laterality Date  . SMALL INTESTINE SURGERY    . STOMACH SURGERY    . TOE AMPUTATION Right 08/2016   right great toe   Allergies  Allergen Reactions  . Aspirin Swelling  . Ibuprofen Swelling  . Sustiva [Efavirenz] Swelling and Rash    And rash.  . Nsaids Other (See Comments)    unknwn   Prior to Admission medications   Medication Sig Start Date End Date Taking? Authorizing Provider  abacavir-dolutegravir-lamiVUDine (TRIUMEQ) 600-50-300 MG tablet Take 1 tablet by mouth daily. 06/09/17   Comer, Okey Regal, MD  acetaminophen (TYLENOL) 500 MG tablet Take 1,000 mg by mouth every 8 (eight) hours as needed for moderate pain.     [provider]  ALPRAZolam Duanne Moron) 1 MG tablet Take 1 mg by mouth 2 (two) times daily.     [provider]  amoxicillin-clavulanate (AUGMENTIN) 875-125 MG tablet Take 1 tablet by mouth 2 (two) times daily. 04/16/17   [name], MD  amphetamine-dextroamphetamine (ADDERALL) 30 MG tablet Take 30 mg by mouth 2 (two) times daily.     [provider]  atorvastatin (LIPITOR) 10 MG tablet Take 1 tablet (10 mg total) by mouth daily. 06/22/17   [name], MD  Continuous [name] Gluc Receiver (FREESTYLE LIBRE 14 DAY READER) DEVI AS DIRECTED 08/13/17   Renato Shin, MD  Continuous [name] Gluc Sensor (FREESTYLE LIBRE 14 DAY SENSOR) MISC 1 Device by Does not apply route  every 14 (fourteen) days. 07/15/17   Renato Shin, MD  diclofenac sodium (VOLTAREN) 1 % GEL APPLY 2 GRAMS TO EACH KNEE IN THE MORNING AND AT BEDTIME AND APPLY 1 GRAM TO EACH KNEE IN THE AFTERNOON. 05/16/17   [name], MD  divalproex (DEPAKOTE ER) 500 MG 24 hr tablet TAKE (1) TABLET BY MOUTH AT BEDTIME. 01/01/17   Marcial Pacas, MD  doxycycline (VIBRA-TABS) 100 MG tablet Take 1 tablet (100 mg total) by mouth 2 (two) times daily. 07/09/17   [name], MD  glucose [name] (FREESTYLE PRECISION NEO TEST) test strip Used to check [name] sugars twice daily. 07/28/17   Renato Shin, MD  insulin aspart (NOVOLOG FLEXPEN) 100 UNIT/ML FlexPen 3 times a day (just before each meal) 12-04-18 units 07/15/17   Renato Shin, MD  Insulin Glargine (LANTUS  SOLOSTAR) 100 UNIT/ML Solostar Pen Inject 140 Units into the skin every morning. 07/15/17   Renato Shin, MD  Insulin Pen Needle (SURE COMFORT PEN NEEDLES) 31G X 8 MM MISC USE TO INJECT LANTUS AND NOVOLOG DAILY. 06/22/17   Renato Shin, MD  levothyroxine (SYNTHROID, LEVOTHROID) 50 MCG tablet Take 1 tablet (50 mcg total) by mouth at bedtime. 07/24/15   [name], MD  LYRICA 100 MG capsule TAKE 1 CAPSULE BY MOUTH THREE TIMES DAILY 08/16/17   [name], MD  Multiple Vitamins-Minerals (ONE-A-DAY MENS 50+ ADVANTAGE PO) Take by mouth.    [provider]  ondansetron (ZOFRAN-ODT) 4 MG disintegrating tablet DISSOLVE 2 TABLETS UNDER THE TONGUE EVERY 8 HOURS AS NEEDED 08/13/17   Comer, Okey Regal, MD  protriptyline (VIVACTIL) 10 MG tablet Take 10 mg by mouth 3 (three) times daily.  01/30/16   [provider]  ranitidine (ZANTAC) 150 MG tablet Take 150 mg by mouth daily as needed.     [provider]  REXULTI 1 MG TABS  11/10/16   [provider]  terbinafine (LAMISIL) 250 MG tablet Take one tablet daily for 1 week out of each month 01/20/17   Stover, Titorya, DPM  TRINTELLIX 20 MG TABS Take 20 mg by mouth at bedtime. 01/26/15    [provider]  UNABLE TO FIND CPAP MACHINE with standard Aclaim nasal mask with humidifier. Set at 14 cwp Patient taking differently: CPAP MACHINE with standard Aclaim nasal mask with humidifier. Set at 4 cwp 04/12/13   [name], MD  XARELTO 15 MG TABS tablet TAKE 1 TABLET BY MOUTH EVERY DAY 07/22/17   [name], MD  XARELTO 15 MG TABS tablet TAKE 1 TABLET BY MOUTH EVERY DAY 08/14/17   [name], MD  zolpidem (AMBIEN) 10 MG tablet Take 10 mg by mouth at bedtime as needed for sleep.     [provider]   Social History   Socioeconomic History  . Marital status: Single    Spouse name: Not on file  . Number of children: Not on file  . Years of education: Not on file  . Highest education level: Not on file  Occupational History  . Not on file  Social Needs  . Financial resource strain: Not on file  . Food insecurity:    Worry: Not on file    Inability: Not on file  . Transportation needs:    Medical: Not on file    Non-medical: Not on file  Tobacco Use  . Smoking status: Former Smoker    Packs/day: 0.10    Years: 10.00    Pack years: 1.00    Types: Cigars    Last attempt to quit: 08/09/2014    Years since quitting: 3.0  . Smokeless tobacco: Never Used  Substance and Sexual Activity  . Alcohol use: No    Alcohol/week: 0.0 oz  . Drug use: No  . Sexual activity: Not Currently    Partners: Male    Comment: pt. declined condoms  Lifestyle  . Physical activity:    Days per week: Not on file    Minutes per session: Not on file  . Stress: Not on file  Relationships  . Social connections:    Talks on phone: Not on file    Gets together: Not on file    Attends religious service: Not on file    Active member of club or organization: Not on file    Attends  meetings of clubs or organizations: Not on file    Relationship status: Not on file  . Intimate partner violence:    Fear of current or ex partner: Not on file    Emotionally abused: Not  on file    Physically abused: Not on file    Forced sexual activity: Not on file  Other Topics Concern  . Not on file  Social History Narrative   Epworth Sleepiness Scale = 7 (as of 03/16/2015)   Review of Systems  Constitutional: Negative for fatigue and unexpected weight change.  Eyes: Positive for visual disturbance.  Respiratory: Negative for cough, chest tightness and shortness of breath.   Cardiovascular: Negative for chest pain, palpitations and leg swelling.  Gastrointestinal: Negative for abdominal pain and [name] in stool.  Neurological: Positive for facial asymmetry. Negative for dizziness, speech difficulty, weakness, light-headedness and headaches.       Objective:   Physical Exam  Constitutional: He is oriented to person, place, and time. He appears well-developed and well-nourished.  HENT:  Head: Normocephalic and atraumatic.  Eyes: Pupils are equal, round, and reactive to light. EOM are normal.  Neck: No JVD present. Carotid bruit is not present.  Cardiovascular: Normal rate, regular rhythm and normal heart sounds.  No murmur heard. Pulmonary/Chest: Effort normal and breath sounds normal. He has no rales.  Musculoskeletal: He exhibits no edema.  Neurological: He is alert and oriented to person, place, and time.  Slight resting tremor but no pronator drift, equal grip strength; slight air leakage from the right from puffing of cheeks, minimal movement on the right with facial droop, unable to close right eye lid with minimal movement, minimal movement of right forehead muscles; minimal to no movement of right face  Skin: Skin is warm and dry.  Psychiatric: He has a normal mood and affect.  Vitals reviewed.   Vitals:   09/03/17 1737  BP: 128/77  Pulse: 98  Resp: 18  Temp: 97.9 F (36.6 C)  TempSrc: Oral  SpO2: 98%  Weight: 248 lb (112.5 kg)  Height: 5' 11"  (1.803 m)       Assessment & Plan:   MAKSIM PEREGOY is a 62 y.o. male Weakness on right side of  face Blurry vision, right eye Bell's palsy  -Initial symptoms and exam overall appear to be consistent with Bell's palsy.  Initial onset of symptoms July 1.  Evaluation by urgent care July 3 and placed on high-dose prednisone as well as Valtrex at that time.  On review of the note from the urgent care visit, he was unable to move his forehead or move any of his right face and did have some right facial drooping at that time it appears to be similar to his exam at present.  This would represent a grade V House-Brackman scale in terms of severity.Reports worsening symptoms, but again as above his exam at present appears similar to the one at urgent care visit.  No other focal weakness, no pronator drift.  -Finishing Valtrex, continuing on prednisone.  Home [name] sugars were significantly higher, but now moving more towards his baseline.  He is followed by endocrinology for diabetes.  -On review of his previous neuroimaging, he did have an MRI in 2017 showing a hyperintensity at the right pons consistent with likely chronic ischemia.  Given current symptoms I did place a call to neurology as although rare cause of Bell's palsy symptoms, could have ischemia/stroke at the pons affecting his facial nerve nucleus or facial  nerve tract with lower motor neuron symptoms.  Based on timing of symptoms, was not sent to ER or acute changes at this time. Waiting on neuro recommendations.   -With right eye blurring, advised to contact his ophthalmologist ASAP for likely slit-lamp exam as dry eye/abrasion/ulceration or possible with inability to completely close eye.  Continue eyedrops with Refresh Tears during the day, Lacri-Lube at night if possible.  Continue patch as needed.  -ER precautions if any progressive neurologic symptoms or new symptoms.  Understanding expressed  Hyperlipidemia, unspecified hyperlipidemia type - Plan: Lipid panel  -He stopped statin on his own, and reports improvement of myalgias.  However  given his multiple comorbidities including diabetes likely needs to be on some form of statin.  Will recheck lipid panel for base reading then can provide further recommendations to find statin he can tolerate.   09/09/17 11:42 AM Phone call placed yesterday as well as today to reach patient.  I did try to reach neurology last Friday but did not receive a call back.  Did speak to neurologist yesterday and based on symptoms did recommend possible MRI brain with and without contrast to screen for inflammatory lesion, or possible CVA causing his symptoms besides the typical Bell's palsy.  Based on timing of symptoms, this was not deemed to be urgent in nature, but did recommend imaging.  I left a voicemail yesterday and again today for patient to call back and let us know a better time to reach him to review these options.  If we are going to do MRI with contrast, I do not see where we have checked his renal function recently, and with history of kidney disease would need to check that level first.   No orders of the defined types were placed in this encounter.  Patient Instructions   Although your symptoms appear to be possible Bell's palsy, that can also c due to aused by an issue within the brain specifically at the pons where you have had a possible abnormality previously.  As those symptoms have worsened, I will contact neurology tomorrow to decide if neuro imaging such as MRI are needed next or possible evaluation with neurology.   Please call your ophthalmologist tomorrow morning for appointment tomorrow so they can evaluate the right eye and make sure there are no ulcerations or irritation from dry eye that can occur with Bell's palsy.  For now make sure you use artificial eyedrops throughout the day, and Lacri-Lube at night if available.   Return when you are fasting for repeat cholesterol test as likely will need to be on some form of statin, other than Lipitor if that caused muscle aches.    Please follow up to discuss Lyrica or other concerns were were unable to address at today's visit.   Return to the clinic or go to the nearest emergency room if any of your symptoms worsen or new symptoms occur.   IF you received an x-[name] today, you will receive an invoice from [facility]. Please contact [facility] at [phone] with questions or concerns regarding your invoice.   IF you received labwork today, you will receive an invoice from [facility]. Please contact [facility] at [phone] with questions or concerns regarding your invoice.   Our billing staff will not be Daniel to assist you with questions regarding bills from these companies.  You will be contacted with the lab results as soon as they are available. The fastest way to get your results is to activate  your My Chart account. Instructions are located on the last page of this paperwork. If you have not heard from [name] regarding the results in 2 weeks, please contact this office.       I personally performed the services described in this documentation, which was scribed in my presence. The recorded information has been reviewed and considered for accuracy and completeness, addended by me as needed, and agree with information above.  Signed,   Daniel Ray, MD [facility].  09/04/17 1:24 PM

## 2017-09-03 NOTE — Patient Instructions (Addendum)
Although your symptoms appear to be possible Bell's palsy, that can also c due to aused by an issue within the brain specifically at the pons where you have had a possible abnormality previously.  As those symptoms have worsened, I will contact neurology tomorrow to decide if neuro imaging such as MRI are needed next or possible evaluation with neurology.   Please call your ophthalmologist tomorrow morning for appointment tomorrow so they can evaluate the right eye and make sure there are no ulcerations or irritation from dry eye that can occur with Bell's palsy.  For now make sure you use artificial eyedrops throughout the day, and Lacri-Lube at night if available.   Return when you are fasting for repeat cholesterol test as likely will need to be on some form of statin, other than Lipitor if that caused muscle aches.   Please follow up to discuss Lyrica or other concerns were were unable to address at today's visit.   Return to the clinic or go to the nearest emergency room if any of your symptoms worsen or new symptoms occur.   IF you received an x-ray today, you will receive an invoice from [facility]. Please contact [facility] at [phone] with questions or concerns regarding your invoice.   IF you received labwork today, you will receive an invoice from [facility]. Please contact [facility] at [phone] with questions or concerns regarding your invoice.   Our billing staff will not be able to assist you with questions regarding bills from these companies.  You will be contacted with the lab results as soon as they are available. The fastest way to get your results is to activate your My Chart account. Instructions are located on the last page of this paperwork. If you have not heard from [name] regarding the results in 2 weeks, please contact this office.

## 2017-09-08 ENCOUNTER — Telehealth: Payer: Self-pay | Admitting: Family Medicine

## 2017-09-08 NOTE — Telephone Encounter (Signed)
Call patient to check status. Left message.  I did discuss his previous symptoms with neurology, no acute work-up needed based on timing of his symptoms, but they did mention possibility of checking MRI with and without contrast to rule out demyelinating lesion, or inflammatory lesion, or stroke at pons as discussed in my note.  Left message for patient that I will try to check on his status later today or  tomorrow to discuss next step with possible MRI.

## 2017-09-09 ENCOUNTER — Encounter: Payer: Self-pay | Admitting: *Deleted

## 2017-09-09 NOTE — Telephone Encounter (Signed)
Requesting call back from Dr Carlota Raspberry tomorrow 8:30-9am and then again after 3:30pm

## 2017-09-10 NOTE — Telephone Encounter (Signed)
Dr Carlota Raspberry please advise.

## 2017-09-11 ENCOUNTER — Telehealth: Payer: Self-pay | Admitting: Family Medicine

## 2017-09-11 NOTE — Telephone Encounter (Signed)
Copied from [place] [id number]. Topic: Quick Communication - See Telephone Encounter >> Sep 11, 2017 12:14 PM Neva Seat wrote: Pt is wanting to know if Dr. Carlota Raspberry wants him to have an MRI or not.  Pt wants Dr. Carlota Raspberry to give him a call today after 3:30 pm Please call: [phone]

## 2017-09-11 NOTE — Telephone Encounter (Signed)
Unfortunately I was not able to call him during those hours yesterday.  Tried to reach again today and left VM.  I can discuss options early next week by phone.

## 2017-09-14 ENCOUNTER — Other Ambulatory Visit: Payer: Self-pay | Admitting: Endocrinology

## 2017-09-14 ENCOUNTER — Telehealth: Payer: Self-pay | Admitting: Family Medicine

## 2017-09-14 DIAGNOSIS — R29818 Other symptoms and signs involving the nervous system: Secondary | ICD-10-CM

## 2017-09-14 NOTE — Telephone Encounter (Signed)
Copied from [place] [id number]. Topic: Quick Communication - See Telephone Encounter >> Sep 11, 2017 12:14 PM Neva Seat wrote: Pt is wanting to know if Dr. Carlota Raspberry wants him to have an MRI or not.  Pt wants Dr. Carlota Raspberry to give him a call today after 3:30 pm Please call: [phone] >> Sep 14, 2017 11:45 AM Mylinda Latina, NT wrote: Patient called and states that he would like the nurse to go ahead and schedule the MRI scan . He states he is not available for tomorrow. Please call patient with that appt time and date. CB# [phone]

## 2017-09-15 ENCOUNTER — Other Ambulatory Visit: Payer: Self-pay | Admitting: Internal Medicine

## 2017-09-15 ENCOUNTER — Encounter: Payer: Self-pay | Admitting: Sports Medicine

## 2017-09-15 ENCOUNTER — Other Ambulatory Visit: Payer: Self-pay | Admitting: Family Medicine

## 2017-09-15 ENCOUNTER — Ambulatory Visit: Payer: PPO | Admitting: Sports Medicine

## 2017-09-15 ENCOUNTER — Other Ambulatory Visit: Payer: Self-pay | Admitting: Neurology

## 2017-09-15 DIAGNOSIS — G510 Bell's palsy: Secondary | ICD-10-CM | POA: Diagnosis not present

## 2017-09-15 DIAGNOSIS — Z89411 Acquired absence of right great toe: Secondary | ICD-10-CM

## 2017-09-15 DIAGNOSIS — E1140 Type 2 diabetes mellitus with diabetic neuropathy, unspecified: Secondary | ICD-10-CM

## 2017-09-15 DIAGNOSIS — L97511 Non-pressure chronic ulcer of other part of right foot limited to breakdown of skin: Secondary | ICD-10-CM | POA: Diagnosis not present

## 2017-09-15 DIAGNOSIS — I82409 Acute embolism and thrombosis of unspecified deep veins of unspecified lower extremity: Secondary | ICD-10-CM

## 2017-09-15 DIAGNOSIS — H04123 Dry eye syndrome of bilateral lacrimal glands: Secondary | ICD-10-CM | POA: Diagnosis not present

## 2017-09-15 DIAGNOSIS — M792 Neuralgia and neuritis, unspecified: Secondary | ICD-10-CM

## 2017-09-15 DIAGNOSIS — E1149 Type 2 diabetes mellitus with other diabetic neurological complication: Secondary | ICD-10-CM

## 2017-09-15 LAB — HM DIABETES EYE EXAM

## 2017-09-15 NOTE — Progress Notes (Signed)
Subjective: Daniel [name] is a 62 y.o. male patient seen today in office for follow up evaluation of right foot blood blister. Reports that his foot seems a little swollen. Has completed Doxycyline and has been applying betadine. Denies nausea, vomiting, fever, chills, or any other constitutional symptoms. Patient denies any other issues.   FBS not recorded   Patient Active Problem List   Diagnosis Date Noted  . Morbid obesity due to excess calories ([place]) complicated by DM / hyperlipidemia 01/15/2017  . DOE (dyspnea on exertion) 01/13/2017  . Confusion 09/17/2016  . Chronic migraine 09/17/2016  . Gait abnormality 09/17/2016  . Diabetic foot infection ([employer]) 06/28/2016  . Screening examination for venereal disease 02/11/2016  . Critical lower limb ischemia 12/07/2015  . Fall 12/05/2015  . Toe ulcer, right ([place]) 09/19/2015  . Decreased pedal pulses 09/19/2015  . OSA on CPAP 09/05/2015  . Major depressive disorder, recurrent episode, moderate ([place]) 09/05/2015  . OSA (obstructive sleep apnea) 07/25/2015  . Low back pain 06/11/2015  . Abnormality of gait 06/11/2015  . Dizziness 03/17/2015  . Weakness 02/21/2015  . Chronic renal insufficiency, stage III (moderate) ([place]) 08/09/2014  . Diabetes ([place]) 11/07/2013  . Hematuria 06/21/2013  . Hepatic steatosis 09/09/2010  . Human immunodeficiency virus (HIV) disease ([place]) 06/04/2006  . HERPES ZOSTER, UNCOMPLICATED 50/56/9794  . THROMBOPHLEBITIS NOS 06/04/2006  . GERD 06/04/2006  . ARTHRITIS, HAND 06/04/2006    Current Outpatient Medications on File Prior to Visit  Medication Sig Dispense Refill  . abacavir-dolutegravir-lamiVUDine (TRIUMEQ) 600-50-300 MG tablet Take 1 tablet by mouth daily. 30 tablet 6  . acetaminophen (TYLENOL) 500 MG tablet Take 1,000 mg by mouth every 8 (eight) hours as needed for moderate pain.     [name] ALPRAZolam (XANAX) 1 MG tablet Take 1 mg by mouth 2 (two) times daily.     [name] amoxicillin-clavulanate (AUGMENTIN) 875-125  MG tablet Take by mouth.    [name] amphetamine-dextroamphetamine (ADDERALL) 30 MG tablet Take 30 mg by mouth 2 (two) times daily.     . Continuous Blood Gluc Receiver (FREESTYLE LIBRE 14 DAY READER) DEVI AS DIRECTED 1 Device 0  . Continuous Blood Gluc Sensor (FREESTYLE LIBRE 14 DAY SENSOR) MISC 1 Device by Does not apply route every 14 (fourteen) days. 6 each 3  . diclofenac sodium (VOLTAREN) 1 % GEL APPLY 2 GRAMS TO EACH KNEE IN THE MORNING AND AT BEDTIME AND APPLY 1 GRAM TO EACH KNEE IN THE AFTERNOON. 300 g 0  . gabapentin (NEURONTIN) 300 MG capsule 300 mg.    . glucose blood (FREESTYLE PRECISION NEO TEST) test strip Used to check blood sugars twice daily. 100 each 12  . insulin aspart (NOVOLOG FLEXPEN) 100 UNIT/ML FlexPen 3 times a day (just before each meal) 12-04-18 units 30 mL 11  . Insulin Glargine (LANTUS SOLOSTAR) 100 UNIT/ML Solostar Pen Inject 140 Units into the skin every morning. 20 pen PRN  . Insulin Pen Needle (B-D ULTRAFINE III SHORT PEN) 31G X 8 MM MISC USE TO INJECT LANTUS AND NOVOLOG DAILY 100 each 0  . levothyroxine (SYNTHROID, LEVOTHROID) 50 MCG tablet Take 1 tablet (50 mcg total) by mouth at bedtime. 30 tablet 11  . LYRICA 100 MG capsule TAKE 1 CAPSULE BY MOUTH THREE TIMES DAILY 90 capsule 0  . Multiple Vitamins-Minerals (ONE-A-DAY MENS 50+ ADVANTAGE PO) Take by mouth.    . ondansetron (ZOFRAN-ODT) 4 MG disintegrating tablet DISSOLVE 2 TABLETS UNDER THE TONGUE EVERY 8 HOURS AS NEEDED 60 tablet 0  . predniSONE (  DELTASONE) 10 MG tablet Take 6 po qd x 5 days then 4-3-2-1 po qd then stop    . protriptyline (VIVACTIL) 10 MG tablet Take 10 mg by mouth 3 (three) times daily.   11  . ranitidine (ZANTAC) 150 MG tablet Take 150 mg by mouth daily as needed.     [name] REXULTI 1 MG TABS   98  . terbinafine (LAMISIL) 250 MG tablet Take one tablet daily for 1 week out of each month 42 tablet 0  . TRINTELLIX 20 MG TABS Take 20 mg by mouth at bedtime.    [name] UNABLE TO FIND CPAP MACHINE with standard  Aclaim nasal mask with humidifier. Set at 14 cwp (Patient taking differently: CPAP MACHINE with standard Aclaim nasal mask with humidifier. Set at 4 cwp) 1 each 0  . valACYclovir (VALTREX) 1000 MG tablet TK 1 T PO TID FOR 7 DAYS  0  . XARELTO 15 MG TABS tablet TAKE 1 TABLET BY MOUTH EVERY DAY 30 tablet 0  . zolpidem (AMBIEN) 10 MG tablet Take 10 mg by mouth at bedtime as needed for sleep.      No current facility-administered medications on file prior to visit.     Allergies  Allergen Reactions  . Aspirin Swelling  . Ibuprofen Swelling  . Sustiva [Efavirenz] Swelling and Rash    And rash.  . Nsaids Other (See Comments)    unknwn    Objective: There were no vitals filed for this visit.  General: No acute distress, AAOx3   Right foot: + partial thickness ulceration at area of previous blister with granular base that measures 0.3x0.2x0.1cm at plantar right forefoot sub met 2 with no surrounding warmth, erythema or edema, nails short and thick, Capillary fill time <5 seconds in all remaining toes on right, protective sensation diminished on right, No pain or crepitation with range of motion right foot. S/p R hallux amputation. No pain with calf compression.   Left foot: No acute findings.  Assessment and Plan:  Problem List Items Addressed This Visit    None    Visit Diagnoses    Ulcer of right foot limited to breakdown of skin ([place])    -  Primary   Diabetic neuropathy with neurologic complication ([place])       Status post amputation of right great toe ([place])         -Patient seen and evaluated -Discussed treatment options for ulceration at right plantar forefoot - Excisionally dedbrided ulceration at right plantar forefoot to healthy bleeding borders removing nonviable tissue using a sterile chisel blade. Wound measures post debridement as above. Wound was debrided to the level of the dermis with viable wound base exposed to promote healing. Hemostasis was achieved with manuel  pressure. Patient tolerated procedure well without any discomfort or anesthesia necessary for this wound debridement.  -Applied betadine and dry sterile dressing and instructed patient to continue with daily dressings at home consisting of the same. - Advised patient to go to the ER or return to office if the wound worsens or if constitutional symptoms are present. -Applied offloading padding to right insole  -Rick advised patient to get new laces for his shoes  -Will plan for continued wound care and xrays if still an issue on right at next visit. In the meantime, patient to call office if any issues or problems arise.   Landis Martins, DPM

## 2017-09-16 NOTE — Telephone Encounter (Signed)
MRI ordered.  As that is with contrast, not sure if they would want him to have a updated creatinine as previously had been mildly elevated.  Okay for verbal order for BUN/creatinine if needed.  Please schedule next few days if possible.

## 2017-09-16 NOTE — Telephone Encounter (Signed)
Called and spoke with Mardene Celeste at [facility] to get pt scheduled. She is going to call pt to do the MRI screening questions and will schedule him then. Thanks!

## 2017-09-17 NOTE — Telephone Encounter (Signed)
Medication refills:  Atorvastatin D/c'd 09/03/17 due to muscle pain; scheduled for lipid panel; not done yet  Lyrica; LRF 08/16/17; #90; no refills  Xarelto; LRF 07/22/17; #30; no refills  Last office visit: 09/03/17  PCP Dr. Carlota Raspberry

## 2017-09-21 ENCOUNTER — Encounter: Payer: Self-pay | Admitting: Endocrinology

## 2017-09-21 ENCOUNTER — Ambulatory Visit (INDEPENDENT_AMBULATORY_CARE_PROVIDER_SITE_OTHER): Payer: PPO | Admitting: Endocrinology

## 2017-09-21 VITALS — BP 132/78 | HR 107 | Ht 71.0 in | Wt 255.2 lb

## 2017-09-21 DIAGNOSIS — E0849 Diabetes mellitus due to underlying condition with other diabetic neurological complication: Secondary | ICD-10-CM | POA: Diagnosis not present

## 2017-09-21 DIAGNOSIS — Z794 Long term (current) use of insulin: Secondary | ICD-10-CM | POA: Diagnosis not present

## 2017-09-21 LAB — POCT GLYCOSYLATED HEMOGLOBIN (HGB A1C): HEMOGLOBIN A1C: 7.6 % — AB (ref 4.0–5.6)

## 2017-09-21 MED ORDER — INSULIN ASPART 100 UNIT/ML FLEXPEN: PEN_INJECTOR | SUBCUTANEOUS | 11 refills | Status: DC

## 2017-09-21 MED ORDER — INSULIN GLARGINE 100 UNIT/ML SOLOSTAR PEN: 150.0000 [IU] | PEN_INJECTOR | SUBCUTANEOUS | 99 refills | Status: DC

## 2017-09-21 NOTE — Progress Notes (Signed)
Subjective:    Patient ID: Daniel [name], male    DOB: 03/13/1955, 62 y.o.   MRN: [id number]  HPI Pt returns for f/u of diabetes mellitus: DM type: Insulin-requiring type 2.  Dx'ed: 5397 Complications: polyneuropathy, PAD, renal insufficiency, and foot ulcers.  Therapy: insulin since 2016. DKA: never Severe hypoglycemia: never.  Pancreatitis: never.   Other: he takes multiple daily injections, but basal insulin is emphasized, with improved results.  Interval history: I reviewed continuous glucose monitor data.  Glucose varies from 53-370.  It is in general lowest at approx 6 PM.  Pt says she takes insulin as rx'ed.   Past Medical History:  Diagnosis Date  . ADHD (attention deficit hyperactivity disorder)   . Anxiety   . Chronic kidney disease   . Clotting disorder ([place])   . Depression   . Diabetes mellitus without complication ([place])   . Diabetes mellitus, type II ([place])   . Dizziness 03/17/2015  . GERD (gastroesophageal reflux disease)   . HIV disease ([place])   . HIV infection ([place])   . Liver disease   . OSA (obstructive sleep apnea) 07/25/2015   Uses CPAP regularly  . Peripheral vascular disease ([place])   . Ulcer     Past Surgical History:  Procedure Laterality Date  . SMALL INTESTINE SURGERY    . STOMACH SURGERY    . TOE AMPUTATION Right 08/2016   right great toe    Social History   Socioeconomic History  . Marital status: Single    Spouse name: Not on file  . Number of children: Not on file  . Years of education: Not on file  . Highest education level: Not on file  Occupational History  . Not on file  Social Needs  . Financial resource strain: Not on file  . Food insecurity:    Worry: Not on file    Inability: Not on file  . Transportation needs:    Medical: Not on file    Non-medical: Not on file  Tobacco Use  . Smoking status: Former Smoker    Packs/day: 0.10    Years: 10.00    Pack years: 1.00    Types: Cigars    Last attempt to quit: 08/09/2014   Years since quitting: 3.1  . Smokeless tobacco: Never Used  Substance and Sexual Activity  . Alcohol use: No    Alcohol/week: 0.0 oz  . Drug use: No  . Sexual activity: Not Currently    Partners: Male    Comment: pt. declined condoms  Lifestyle  . Physical activity:    Days per week: Not on file    Minutes per session: Not on file  . Stress: Not on file  Relationships  . Social connections:    Talks on phone: Not on file    Gets together: Not on file    Attends religious service: Not on file    Active member of club or organization: Not on file    Attends meetings of clubs or organizations: Not on file    Relationship status: Not on file  . Intimate partner violence:    Fear of current or ex partner: Not on file    Emotionally abused: Not on file    Physically abused: Not on file    Forced sexual activity: Not on file  Other Topics Concern  . Not on file  Social History Narrative   Epworth Sleepiness Scale = 7 (as of 03/16/2015)    Current Outpatient Medications  on File Prior to Visit  Medication Sig Dispense Refill  . abacavir-dolutegravir-lamiVUDine (TRIUMEQ) 600-50-300 MG tablet Take 1 tablet by mouth daily. 30 tablet 6  . acetaminophen (TYLENOL) 500 MG tablet Take 1,000 mg by mouth every 8 (eight) hours as needed for moderate pain.     [name] ALPRAZolam (XANAX) 1 MG tablet Take 1 mg by mouth 2 (two) times daily.     [name] amoxicillin-clavulanate (AUGMENTIN) 875-125 MG tablet Take by mouth.    [name] amphetamine-dextroamphetamine (ADDERALL) 30 MG tablet Take 30 mg by mouth 2 (two) times daily.     . Continuous Blood Gluc Receiver (FREESTYLE LIBRE 14 DAY READER) DEVI AS DIRECTED 1 Device 0  . Continuous Blood Gluc Sensor (FREESTYLE LIBRE 14 DAY SENSOR) MISC 1 Device by Does not apply route every 14 (fourteen) days. 6 each 3  . diclofenac sodium (VOLTAREN) 1 % GEL APPLY 2 GRAMS TO EACH KNEE IN THE MORNING AND AT BEDTIME AND APPLY 1 GRAM TO EACH KNEE IN THE AFTERNOON. 300 g 0  .  divalproex (DEPAKOTE ER) 500 MG 24 hr tablet TAKE 1 TABLET BY MOUTH EVERY NIGHT AT BEDTIME 90 tablet 0  . gabapentin (NEURONTIN) 300 MG capsule 300 mg.    . glucose blood (FREESTYLE PRECISION NEO TEST) test strip Used to check blood sugars twice daily. 100 each 12  . Insulin Pen Needle (B-D ULTRAFINE III SHORT PEN) 31G X 8 MM MISC USE TO INJECT LANTUS AND NOVOLOG DAILY 100 each 0  . levothyroxine (SYNTHROID, LEVOTHROID) 50 MCG tablet Take 1 tablet (50 mcg total) by mouth at bedtime. 30 tablet 11  . LYRICA 100 MG capsule TAKE 1 CAPSULE BY MOUTH THREE TIMES DAILY 90 capsule 0  . Multiple Vitamins-Minerals (ONE-A-DAY MENS 50+ ADVANTAGE PO) Take by mouth.    . ondansetron (ZOFRAN-ODT) 4 MG disintegrating tablet DISSOLVE 2 TABLETS UNDER THE TONGUE EVERY 8 HOURS AS NEEDED 60 tablet 0  . predniSONE (DELTASONE) 10 MG tablet Take 6 po qd x 5 days then 4-3-2-1 po qd then stop    . protriptyline (VIVACTIL) 10 MG tablet Take 10 mg by mouth 3 (three) times daily.   11  . ranitidine (ZANTAC) 150 MG tablet Take 150 mg by mouth daily as needed.     [name] REXULTI 1 MG TABS   98  . terbinafine (LAMISIL) 250 MG tablet Take one tablet daily for 1 week out of each month 42 tablet 0  . TRINTELLIX 20 MG TABS Take 20 mg by mouth at bedtime.    [name] UNABLE TO FIND CPAP MACHINE with standard Aclaim nasal mask with humidifier. Set at 14 cwp (Patient taking differently: CPAP MACHINE with standard Aclaim nasal mask with humidifier. Set at 4 cwp) 1 each 0  . valACYclovir (VALTREX) 1000 MG tablet TK 1 T PO TID FOR 7 DAYS  0  . XARELTO 15 MG TABS tablet TAKE 1 TABLET BY MOUTH EVERY DAY 30 tablet 0  . zolpidem (AMBIEN) 10 MG tablet Take 10 mg by mouth at bedtime as needed for sleep.      No current facility-administered medications on file prior to visit.     Allergies  Allergen Reactions  . Aspirin Swelling  . Ibuprofen Swelling  . Sustiva [Efavirenz] Swelling and Rash    And rash.  . Nsaids Other (See Comments)    unknwn     Family History  Problem Relation Age of Onset  . Depression Brother   . Throat cancer Brother  half brother, never smoker  . COPD Mother   . Diabetes Neg Hx     BP 132/78 (BP Location: Right Arm, Patient Position: Sitting, Cuff Size: Normal)   Pulse (!) 107   Ht 5' 11"  (1.803 m)   Wt 255 lb 3.2 oz (115.8 kg)   SpO2 95%   BMI 35.59 kg/m   Review of Systems Denies LOC    Objective:   Physical Exam VITAL SIGNS:  See vs page.  GENERAL: no distress. Pulses: foot pulses are intact bilaterally.   MSK: no deformity of the feet or ankles, except the right great toe is surgically absent.  Right foot ulcer is bandaged CV: 2+ bilat edema of the legs  Skin:  normal color and temp on the feet and ankles.  Neuro: sensation is intact to touch on the feet and ankles, but decreased from normal.  There is bilateral onychomycosis of the toenails.    A1c=7.6%    Assessment & Plan:  Insulin-requiring type 2 DM, with PAD: this is the best control this pt should aim for, given this regimen, which does match insulin to his changing needs throughout the day.     Patient Instructions  check your blood sugar twice a day.  vary the time of day when you check, between before the 3 meals, and at bedtime.  also check if you have symptoms of your blood sugar being too high or too low.  please keep a record of the readings and bring it to your next appointment here (or you can bring the meter itself).  You can write it on any piece of paper.  please call us sooner if your blood sugar goes below 70, or if you have a lot of readings over 200.   please increase the lantus to 140 units each morning, and:  reduce the novolog to 2 times a day (just before meals) 10-0-20 units.  If you skip a meal, you should also skip the novolog.   Please come back for a follow-up appointment in 3 months.

## 2017-09-21 NOTE — Patient Instructions (Addendum)
check your blood sugar twice a day.  vary the time of day when you check, between before the 3 meals, and at bedtime.  also check if you have symptoms of your blood sugar being too high or too low.  please keep a record of the readings and bring it to your next appointment here (or you can bring the meter itself).  You can write it on any piece of paper.  please call us sooner if your blood sugar goes below 70, or if you have a lot of readings over 200.   please increase the lantus to 140 units each morning, and:  reduce the novolog to 2 times a day (just before meals) 10-0-20 units.  If you skip a meal, you should also skip the novolog.   Please come back for a follow-up appointment in 3 months.

## 2017-09-22 NOTE — Telephone Encounter (Signed)
Refill placed for Lyrica and Xarelto, but needs office visit to discuss these medications further as last discussed in January for Lyrica.  Other more recent office visits have been for acute issues.    Lipitor was not refilled as he had taken himself off of that medication due to significant myalgias last visit and there was a plan for him to return for fasting lab work which has not yet been done.  Please have him return for the fasting lab work and schedule office visit with me to review chronic issues/above medications in the next 1 month.  Thank you.

## 2017-09-23 ENCOUNTER — Telehealth: Payer: Self-pay | Admitting: *Deleted

## 2017-09-23 NOTE — Telephone Encounter (Signed)
Have patient apply betadine to area. If swelling warmth redness worsens to go to ER or come to office for further evaluation. -[name]

## 2017-09-23 NOTE — Telephone Encounter (Signed)
Patient states that his foot was swollen up between 2nd & 3rd toe it had a pocket in it first notices today squeezed it and drainage came out.  No odor sightly bloody water like drainage.

## 2017-09-24 ENCOUNTER — Ambulatory Visit
Admission: RE | Admit: 2017-09-24 | Discharge: 2017-09-24 | Disposition: A | Discharge status: Home / Self Care | Payer: PPO | Source: Ambulatory Visit | Attending: Family Medicine | Admitting: Family Medicine

## 2017-09-24 DIAGNOSIS — R29810 Facial weakness: Secondary | ICD-10-CM | POA: Diagnosis not present

## 2017-09-24 DIAGNOSIS — R29818 Other symptoms and signs involving the nervous system: Secondary | ICD-10-CM

## 2017-09-24 MED ORDER — GADOBENATE DIMEGLUMINE 529 MG/ML IV SOLN
20.0000 mL | Freq: Once | INTRAVENOUS | Status: AC | PRN
  Administered 2017-09-24: 20 mL via INTRAVENOUS

## 2017-09-28 ENCOUNTER — Emergency Department (HOSPITAL_COMMUNITY): Payer: PPO

## 2017-09-28 ENCOUNTER — Encounter (HOSPITAL_COMMUNITY): Payer: Self-pay | Admitting: Emergency Medicine

## 2017-09-28 ENCOUNTER — Emergency Department (HOSPITAL_COMMUNITY)
Admission: EM | Admit: 2017-09-28 | Discharge: 2017-09-28 | Disposition: A | Discharge status: Home / Self Care | Payer: PPO | Source: Home / Self Care | Attending: Emergency Medicine | Admitting: Emergency Medicine

## 2017-09-28 ENCOUNTER — Other Ambulatory Visit: Payer: Self-pay

## 2017-09-28 DIAGNOSIS — R000 Tachycardia, unspecified: Secondary | ICD-10-CM | POA: Diagnosis not present

## 2017-09-28 DIAGNOSIS — W19XXXA Unspecified fall, initial encounter: Secondary | ICD-10-CM

## 2017-09-28 DIAGNOSIS — M545 Low back pain: Secondary | ICD-10-CM | POA: Insufficient documentation

## 2017-09-28 DIAGNOSIS — Z87891 Personal history of nicotine dependence: Secondary | ICD-10-CM

## 2017-09-28 DIAGNOSIS — Y9289 Other specified places as the place of occurrence of the external cause: Secondary | ICD-10-CM | POA: Insufficient documentation

## 2017-09-28 DIAGNOSIS — M549 Dorsalgia, unspecified: Secondary | ICD-10-CM | POA: Diagnosis not present

## 2017-09-28 DIAGNOSIS — E119 Type 2 diabetes mellitus without complications: Secondary | ICD-10-CM

## 2017-09-28 DIAGNOSIS — S0990XA Unspecified injury of head, initial encounter: Secondary | ICD-10-CM | POA: Diagnosis not present

## 2017-09-28 DIAGNOSIS — R0689 Other abnormalities of breathing: Secondary | ICD-10-CM | POA: Diagnosis not present

## 2017-09-28 DIAGNOSIS — W11XXXA Fall on and from ladder, initial encounter: Secondary | ICD-10-CM | POA: Insufficient documentation

## 2017-09-28 DIAGNOSIS — Z7901 Long term (current) use of anticoagulants: Secondary | ICD-10-CM

## 2017-09-28 DIAGNOSIS — Y9389 Activity, other specified: Secondary | ICD-10-CM | POA: Insufficient documentation

## 2017-09-28 DIAGNOSIS — Z79899 Other long term (current) drug therapy: Secondary | ICD-10-CM | POA: Insufficient documentation

## 2017-09-28 DIAGNOSIS — B20 Human immunodeficiency virus [HIV] disease: Secondary | ICD-10-CM | POA: Insufficient documentation

## 2017-09-28 DIAGNOSIS — N183 Chronic kidney disease, stage 3 (moderate): Secondary | ICD-10-CM

## 2017-09-28 DIAGNOSIS — Y999 Unspecified external cause status: Secondary | ICD-10-CM

## 2017-09-28 DIAGNOSIS — R0902 Hypoxemia: Secondary | ICD-10-CM | POA: Diagnosis not present

## 2017-09-28 DIAGNOSIS — S299XXA Unspecified injury of thorax, initial encounter: Secondary | ICD-10-CM

## 2017-09-28 DIAGNOSIS — Z794 Long term (current) use of insulin: Secondary | ICD-10-CM | POA: Insufficient documentation

## 2017-09-28 DIAGNOSIS — R0789 Other chest pain: Secondary | ICD-10-CM | POA: Diagnosis not present

## 2017-09-28 DIAGNOSIS — R319 Hematuria, unspecified: Secondary | ICD-10-CM

## 2017-09-28 DIAGNOSIS — R0602 Shortness of breath: Secondary | ICD-10-CM | POA: Diagnosis not present

## 2017-09-28 DIAGNOSIS — R410 Disorientation, unspecified: Secondary | ICD-10-CM | POA: Diagnosis not present

## 2017-09-28 DIAGNOSIS — S199XXA Unspecified injury of neck, initial encounter: Secondary | ICD-10-CM | POA: Diagnosis not present

## 2017-09-28 DIAGNOSIS — S3991XA Unspecified injury of abdomen, initial encounter: Secondary | ICD-10-CM | POA: Diagnosis not present

## 2017-09-28 LAB — I-STAT CHEM 8, ED
BUN: 21 mg/dL (ref 8–23)
CHLORIDE: 103 mmol/L (ref 98–111)
Calcium, Ion: 1.17 mmol/L (ref 1.15–1.40)
Creatinine, Ser: 1.5 mg/dL — ABNORMAL HIGH (ref 0.61–1.24)
GLUCOSE: 177 mg/dL — AB (ref 70–99)
HCT: 40 % (ref 39.0–52.0)
Hemoglobin: 13.6 g/dL (ref 13.0–17.0)
POTASSIUM: 4.3 mmol/L (ref 3.5–5.1)
Sodium: 137 mmol/L (ref 135–145)
TCO2: 25 mmol/L (ref 22–32)

## 2017-09-28 LAB — COMPREHENSIVE METABOLIC PANEL
ALT: 26 U/L (ref 0–44)
ANION GAP: 6 (ref 5–15)
AST: 22 U/L (ref 15–41)
Albumin: 3.1 g/dL — ABNORMAL LOW (ref 3.5–5.0)
Alkaline Phosphatase: 77 U/L (ref 38–126)
BUN: 22 mg/dL (ref 8–23)
CHLORIDE: 102 mmol/L (ref 98–111)
CO2: 26 mmol/L (ref 22–32)
Calcium: 8.4 mg/dL — ABNORMAL LOW (ref 8.9–10.3)
Creatinine, Ser: 1.45 mg/dL — ABNORMAL HIGH (ref 0.61–1.24)
GFR, EST AFRICAN AMERICAN: 57 mL/min — AB (ref >60)
GFR, EST NON AFRICAN AMERICAN: 49 mL/min — AB (ref >60)
Glucose, Bld: 181 mg/dL — ABNORMAL HIGH (ref 70–99)
POTASSIUM: 4.1 mmol/L (ref 3.5–5.1)
SODIUM: 134 mmol/L — AB (ref 135–145)
Total Bilirubin: 1.3 mg/dL — ABNORMAL HIGH (ref 0.3–1.2)
Total Protein: 6.6 g/dL (ref 6.5–8.1)

## 2017-09-28 LAB — URINALYSIS, ROUTINE W REFLEX MICROSCOPIC
BILIRUBIN URINE: NEGATIVE
Bacteria, UA: NONE SEEN
Glucose, UA: >=500 mg/dL — AB
KETONES UR: NEGATIVE mg/dL
LEUKOCYTES UA: NEGATIVE
NITRITE: NEGATIVE
PH: 6 (ref 5.0–8.0)
PROTEIN: 100 mg/dL — AB
Specific Gravity, Urine: 1.025 (ref 1.005–1.030)

## 2017-09-28 LAB — CBC WITH DIFFERENTIAL/PLATELET
Basophils Absolute: 0 10*3/uL (ref 0.0–0.1)
Basophils Relative: 0 %
EOS ABS: 0 10*3/uL (ref 0.0–0.7)
EOS PCT: 0 %
HCT: 42 % (ref 39.0–52.0)
Hemoglobin: 14.6 g/dL (ref 13.0–17.0)
LYMPHS ABS: 1.2 10*3/uL (ref 0.7–4.0)
Lymphocytes Relative: 11 %
MCH: 33.4 pg (ref 26.0–34.0)
MCHC: 34.8 g/dL (ref 30.0–36.0)
MCV: 96.1 fL (ref 78.0–100.0)
MONO ABS: 1.5 10*3/uL — AB (ref 0.1–1.0)
Monocytes Relative: 14 %
Neutro Abs: 8 10*3/uL — ABNORMAL HIGH (ref 1.7–7.7)
Neutrophils Relative %: 75 %
PLATELETS: 108 10*3/uL — AB (ref 150–400)
RBC: 4.37 MIL/uL (ref 4.22–5.81)
RDW: 13.7 % (ref 11.5–15.5)
WBC: 10.7 10*3/uL — ABNORMAL HIGH (ref 4.0–10.5)

## 2017-09-28 LAB — RAPID URINE DRUG SCREEN, HOSP PERFORMED
Amphetamines: POSITIVE — AB
BARBITURATES: NOT DETECTED
Benzodiazepines: POSITIVE — AB
Cocaine: NOT DETECTED
Opiates: NOT DETECTED
Tetrahydrocannabinol: NOT DETECTED

## 2017-09-28 LAB — ETHANOL

## 2017-09-28 MED ORDER — IOPAMIDOL (ISOVUE-300) INJECTION 61%
100.0000 mL | Freq: Once | INTRAVENOUS | Status: AC | PRN
  Administered 2017-09-28: 100 mL via INTRAVENOUS

## 2017-09-28 MED ORDER — SODIUM CHLORIDE 0.9 % IV BOLUS
500.0000 mL | Freq: Once | INTRAVENOUS | Status: AC
  Administered 2017-09-28: 500 mL via INTRAVENOUS

## 2017-09-28 NOTE — ED Notes (Signed)
Patient removed c-collar.

## 2017-09-28 NOTE — ED Notes (Signed)
Pt spilled urinal on himself.  Bed changed and pt cleaned.  Pt asking when he can "get the hell Daniel [name] here."  Much more alert than when he arrived as he has been observed from the nursing station snoring asleep for over 3 hours.  Pt frequently dozes off without stimulation and appears quite medicated.  Has asked for his home medication bag several times and even referred to it as his "bag of candy."  Medications moved out of reach of pt.

## 2017-09-28 NOTE — ED Provider Notes (Signed)
[facility] EMERGENCY DEPARTMENT Provider Note   CSN: [id number] Arrival date & time: 09/28/17  1304     History   Chief Complaint Chief Complaint  Patient presents with  . Fall    HPI MAVRYK PINO is a 62 y.o. male.  Patient states that he fell off a ladder from about 1 story up this weekend.  He also states that he fell this morning could get up from the floor.  Patient complains of pain in his lower back  The history is provided by the patient. No language interpreter was used.  Fall  This is a new problem. The current episode started more than 2 days ago. The problem occurs rarely. The problem has been resolved. Pertinent negatives include no chest pain, no abdominal pain and no headaches. Nothing aggravates the symptoms. Nothing relieves the symptoms. He has tried nothing for the symptoms. The treatment provided no relief.    Past Medical History:  Diagnosis Date  . ADHD (attention deficit hyperactivity disorder)   . Anxiety   . Chronic kidney disease   . Clotting disorder ([place])   . Depression   . Diabetes mellitus without complication ([place])   . Diabetes mellitus, type II ([place])   . Dizziness 03/17/2015  . GERD (gastroesophageal reflux disease)   . HIV disease ([place])   . HIV infection ([place])   . Liver disease   . OSA (obstructive sleep apnea) 07/25/2015   Uses CPAP regularly  . Peripheral vascular disease ([place])   . Ulcer     Patient Active Problem List   Diagnosis Date Noted  . Morbid obesity due to excess calories ([place]) complicated by DM / hyperlipidemia 01/15/2017  . DOE (dyspnea on exertion) 01/13/2017  . Confusion 09/17/2016  . Chronic migraine 09/17/2016  . Gait abnormality 09/17/2016  . Diabetic foot infection ([place]) 06/28/2016  . Screening examination for venereal disease 02/11/2016  . Critical lower limb ischemia 12/07/2015  . Fall 12/05/2015  . Toe ulcer, right ([place]) 09/19/2015  . Decreased pedal pulses 09/19/2015  . OSA on CPAP 09/05/2015  . Major  depressive disorder, recurrent episode, moderate ([place]) 09/05/2015  . OSA (obstructive sleep apnea) 07/25/2015  . Low back pain 06/11/2015  . Abnormality of gait 06/11/2015  . Dizziness 03/17/2015  . Weakness 02/21/2015  . Chronic renal insufficiency, stage III (moderate) ([place]) 08/09/2014  . Diabetes ([place]) 11/07/2013  . Hematuria 06/21/2013  . Hepatic steatosis 09/09/2010  . Human immunodeficiency virus (HIV) disease ([place]) 06/04/2006  . HERPES ZOSTER, UNCOMPLICATED 59/56/3875  . THROMBOPHLEBITIS NOS 06/04/2006  . GERD 06/04/2006  . ARTHRITIS, HAND 06/04/2006    Past Surgical History:  Procedure Laterality Date  . SMALL INTESTINE SURGERY    . STOMACH SURGERY    . TOE AMPUTATION Right 08/2016   right great toe        Home Medications    Prior to Admission medications   Medication Sig Start Date End Date Taking? Authorizing Provider  abacavir-dolutegravir-lamiVUDine (TRIUMEQ) 600-50-300 MG tablet Take 1 tablet by mouth daily. 06/09/17  Yes Comer, Okey Regal, MD  acetaminophen (TYLENOL) 500 MG tablet Take 1,000 mg by mouth every 8 (eight) hours as needed for moderate pain.    Yes [provider]  ALPRAZolam Duanne Moron) 1 MG tablet Take 1 mg by mouth 4 (four) times daily as needed for anxiety.    Yes [provider]  divalproex (DEPAKOTE ER) 500 MG 24 hr tablet TAKE 1 TABLET BY MOUTH EVERY NIGHT AT BEDTIME 09/15/17  Yes  Marcial Pacas, MD  insulin aspart (NOVOLOG FLEXPEN) 100 UNIT/ML FlexPen 10 units with breakfast, and 20 units with evening meal Patient taking differently: Inject 10-20 Units into the skin See admin instructions. 10 units with breakfast, and 20 units with evening meal 09/21/17  Yes Renato Shin, MD  Insulin Glargine (LANTUS SOLOSTAR) 100 UNIT/ML Solostar Pen Inject 150 Units into the skin every morning. 09/21/17  Yes Renato Shin, MD  levothyroxine (SYNTHROID, LEVOTHROID) 50 MCG tablet Take 1 tablet (50 mcg total) by mouth at bedtime. 07/24/15  Yes [name], MD  LYRICA 100 MG capsule TAKE 1 CAPSULE BY MOUTH THREE TIMES DAILY 09/22/17  Yes [name], MD  protriptyline (VIVACTIL) 10 MG tablet Take 10 mg by mouth 3 (three) times daily.  01/30/16  Yes [provider]  ranitidine (ZANTAC) 150 MG tablet Take 150 mg by mouth daily as needed.    Yes [provider]  TRINTELLIX 20 MG TABS Take 20 mg by mouth at bedtime. 01/26/15  Yes [provider]  XARELTO 15 MG TABS tablet TAKE 1 TABLET BY MOUTH EVERY DAY 09/22/17  Yes [name], MD  zolpidem (AMBIEN) 10 MG tablet Take 10 mg by mouth at bedtime.    Yes [provider]  Continuous Blood Gluc Receiver (FREESTYLE LIBRE 14 DAY READER) DEVI AS DIRECTED 08/13/17   Renato Shin, MD  Continuous Blood Gluc Sensor (FREESTYLE LIBRE 14 DAY SENSOR) MISC 1 Device by Does not apply route every 14 (fourteen) days. 07/15/17   Renato Shin, MD  diclofenac sodium (VOLTAREN) 1 % GEL APPLY 2 GRAMS TO EACH KNEE IN THE MORNING AND AT BEDTIME AND APPLY 1 GRAM TO EACH KNEE IN THE AFTERNOON. 05/16/17   [name], MD  gabapentin (NEURONTIN) 300 MG capsule 300 mg.    [provider]  glucose blood (FREESTYLE PRECISION NEO TEST) test strip Used to check blood sugars twice daily. 07/28/17   Renato Shin, MD  Insulin Pen Needle (B-D ULTRAFINE III SHORT PEN) 31G X 8 MM MISC USE TO INJECT LANTUS AND NOVOLOG DAILY 09/15/17   Renato Shin, MD  ondansetron (ZOFRAN-ODT) 4 MG disintegrating tablet DISSOLVE 2 TABLETS UNDER THE TONGUE EVERY 8 HOURS AS NEEDED Patient not taking: Reported on 09/28/2017 08/13/17   Thayer Headings, MD  predniSONE (DELTASONE) 10 MG tablet Take 6 po qd x 5 days then 4-3-2-1 po qd then stop 08/26/17   [provider]  UNABLE TO FIND CPAP MACHINE with standard Aclaim nasal mask with humidifier. Set at 14 cwp Patient taking differently: CPAP MACHINE with standard Aclaim nasal mask with humidifier. Set at 4 cwp 04/12/13   [name], MD  XARELTO 15 MG  TABS tablet TAKE 1 TABLET BY MOUTH EVERY DAY Patient not taking: Reported on 09/28/2017 07/22/17   [name], MD    Family History Family History  Problem Relation Age of Onset  . Depression Brother   . Throat cancer Brother        half brother, never smoker  . COPD Mother   . Diabetes Neg Hx     Social History Social History   Tobacco Use  . Smoking status: Former Smoker    Packs/day: 0.10    Years: 10.00    Pack years: 1.00    Types: Cigars    Last attempt to quit: 08/09/2014    Years since quitting: 3.1  . Smokeless tobacco: Never Used  Substance Use Topics  . Alcohol use: No  Alcohol/week: 0.0 oz  . Drug use: No     Allergies   Aspirin; Ibuprofen; Sustiva [efavirenz]; and Nsaids   Review of Systems Review of Systems  Constitutional: Negative for appetite change and fatigue.  HENT: Negative for congestion, ear discharge and sinus pressure.   Eyes: Negative for discharge.  Respiratory: Negative for cough.   Cardiovascular: Negative for chest pain.  Gastrointestinal: Negative for abdominal pain and diarrhea.  Genitourinary: Negative for frequency and hematuria.  Musculoskeletal: Positive for back pain.  Skin: Negative for rash.  Neurological: Negative for seizures and headaches.  Psychiatric/Behavioral: Negative for hallucinations.     Physical Exam Updated Vital Signs BP (!) 120/49   Pulse (!) 102   Temp 98.3 F (36.8 C) (Oral)   Resp (!) 21   Ht 5' 11"  (1.803 m)   Wt 115.7 kg (255 lb)   SpO2 100%   BMI 35.57 kg/m   Physical Exam  Constitutional: He is oriented to person, place, and time. He appears well-developed.  HENT:  Head: Normocephalic.  Eyes: Conjunctivae and EOM are normal. No scleral icterus.  Neck: Neck supple. No thyromegaly present.  Cardiovascular: Normal rate and regular rhythm. Exam reveals no gallop and no friction rub.  No murmur heard. Pulmonary/Chest: No stridor. He has no wheezes. He has no rales. He exhibits no  tenderness.  Abdominal: He exhibits no distension. There is no tenderness. There is no rebound.  Musculoskeletal: Normal range of motion. He exhibits no edema.  Patient able to ambulate slightly unsteady.  Patient did walk without a cane or walker, moderate lumbar spine tenderness  Lymphadenopathy:    He has no cervical adenopathy.  Neurological: He is oriented to person, place, and time. He exhibits normal muscle tone. Coordination normal.  Patient mildly lethargic  Skin: No rash noted. No erythema.  Psychiatric: He has a normal mood and affect. His behavior is normal.     ED Treatments / Results  Labs (all labs ordered are listed, but only abnormal results are displayed) Labs Reviewed  CBC WITH DIFFERENTIAL/PLATELET - Abnormal; Notable for the following components:      Result Value   WBC 10.7 (*)    Platelets 108 (*)    Neutro Abs 8.0 (*)    Monocytes Absolute 1.5 (*)    All other components within normal limits  COMPREHENSIVE METABOLIC PANEL - Abnormal; Notable for the following components:   Sodium 134 (*)    Glucose, Bld 181 (*)    Creatinine, Ser 1.45 (*)    Calcium 8.4 (*)    Albumin 3.1 (*)    Total Bilirubin 1.3 (*)    GFR calc non Af Amer 49 (*)    GFR calc Af Amer 57 (*)    All other components within normal limits  URINALYSIS, ROUTINE W REFLEX MICROSCOPIC - Abnormal; Notable for the following components:   Glucose, UA >=500 (*)    Hgb urine dipstick MODERATE (*)    Protein, ur 100 (*)    All other components within normal limits  RAPID URINE DRUG SCREEN, HOSP PERFORMED - Abnormal; Notable for the following components:   Benzodiazepines POSITIVE (*)    Amphetamines POSITIVE (*)    All other components within normal limits  I-STAT CHEM 8, ED - Abnormal; Notable for the following components:   Creatinine, Ser 1.50 (*)    Glucose, Bld 177 (*)    All other components within normal limits  ETHANOL    EKG None  Radiology Ct Head Wo  Contrast  Result Date:  09/28/2017 CLINICAL DATA:  Multiple recent falls EXAM: CT HEAD WITHOUT CONTRAST CT CERVICAL SPINE WITHOUT CONTRAST TECHNIQUE: Multidetector CT imaging of the head and cervical spine was performed following the standard protocol without intravenous contrast. Multiplanar CT image reconstructions of the cervical spine were also generated. COMPARISON:  None. FINDINGS: CT HEAD FINDINGS Brain: There is no mass, hemorrhage or extra-axial collection. The size and configuration of the ventricles and extra-axial CSF spaces are normal. There is no acute or chronic infarction. There is hypoattenuation of the periventricular white matter, most commonly indicating chronic ischemic microangiopathy. Vascular: No abnormal hyperdensity of the major intracranial arteries or dural venous sinuses. No intracranial atherosclerosis. Skull: The visualized skull base, calvarium and extracranial soft tissues are normal. Sinuses/Orbits: No fluid levels or advanced mucosal thickening of the visualized paranasal sinuses. No mastoid or middle ear effusion. The orbits are normal. CT CERVICAL SPINE FINDINGS Alignment: No static subluxation. Facets are aligned. Occipital condyles are normally positioned. Skull base and vertebrae: No acute fracture. Soft tissues and spinal canal: No prevertebral fluid or swelling. No visible canal hematoma. Disc levels: No advanced spinal canal or neural foraminal stenosis. Upper chest: No pneumothorax, pulmonary nodule or pleural effusion. Other: Normal visualized paraspinal cervical soft tissues. IMPRESSION: No acute intracranial abnormality. No fracture or static subluxation of the cervical spine. 1. Mild chronic small vessel disease. Electronically Signed   By: Ulyses Jarred M.D.   On: 09/28/2017 16:34   Ct Chest W Contrast  Result Date: 09/28/2017 CLINICAL DATA:  Recurrent falls for 3 days. Macroscopic hematuria. Lethargy. History of HIV, diabetes, bowel surgery. EXAM: CT CHEST, ABDOMEN, AND PELVIS WITH  CONTRAST TECHNIQUE: Multidetector CT imaging of the chest, abdomen and pelvis was performed following the standard protocol during bolus administration of intravenous contrast. CONTRAST:  177m ISOVUE-300 IOPAMIDOL (ISOVUE-300) INJECTION 61% COMPARISON:  CT abdomen and pelvis April 13, 2016. FINDINGS: CT CHEST FINDINGS CARDIOVASCULAR: Heart is upper limits of normal size. Trace pericardial effusion. Thoracic aorta is normal course and caliber, unremarkable. Pulmonary vascular congestion. MEDIASTINUM/NODES: No mediastinal mass. No lymphadenopathy by CT size criteria. Normal appearance of thoracic esophagus though not tailored for evaluation. LUNGS/PLEURA: Tracheobronchial tree is patent, no pneumothorax. No pleural effusions, focal consolidations, pulmonary nodules or masses. Bilateral lower lobe atelectasis. MUSCULOSKELETAL: Nonacute. Old LEFT ninth rib fracture. Mild spondylosis. CT ABDOMEN AND PELVIS FINDINGS HEPATOBILIARY: Diffusely hypodense liver compatible with steatosis. Enlarged, 24.8 cm in craniocaudad dimension. Normal gallbladder. PANCREAS: Fatty atrophy, punctate calcifications.  Nonacute. SPLEEN: Enlarged, 18.6 cm in craniocaudad dimension. ADRENALS/URINARY TRACT: Kidneys are orthotopic, demonstrating symmetric enhancement. 2 mm LEFT lower pole nephrolithiasis. No hydronephrosis or solid renal masses. The unopacified ureters are normal in course and caliber. Delayed imaging through the kidneys demonstrates symmetric prompt contrast excretion within the proximal urinary collecting system. Urinary bladder is adequately distended and unremarkable. Normal adrenal glands. STOMACH/BOWEL: Status post partial gastrectomy. The stomach, small and large bowel are normal in caliber without inflammatory changes. Large amount of retained large bowel stool. Midline mobile cecum. Normal appendix. VASCULAR/LYMPHATIC: Aortoiliac vessels are normal in course and caliber. Mild calcific atherosclerosis. No  lymphadenopathy by CT size criteria. REPRODUCTIVE: Prostate size is normal.  Punctate calcifications. OTHER: No intraperitoneal free fluid or free air. MUSCULOSKELETAL: Non-acute. Small fat containing LEFT inguinal hernia. Bridging LEFT sacroiliac osteophyte. IMPRESSION: CT CHEST: 1. No CT findings of acute trauma. 2. Mild cardiomegaly and pulmonary vascular congestion. CT ABDOMEN AND PELVIS: 1. No CT findings of acute trauma. No acute intra-abdominopelvic process. 2. 2  mm nonobstructing LEFT nephrolithiasis. 3. Large amount of retained large bowel stool without bowel obstruction. 4. Worsening hepatosplenomegaly. Aortic Atherosclerosis (ICD10-I70.0). Electronically Signed   By: Elon Alas M.D.   On: 09/28/2017 16:37   Ct Cervical Spine Wo Contrast  Result Date: 09/28/2017 CLINICAL DATA:  Multiple recent falls EXAM: CT HEAD WITHOUT CONTRAST CT CERVICAL SPINE WITHOUT CONTRAST TECHNIQUE: Multidetector CT imaging of the head and cervical spine was performed following the standard protocol without intravenous contrast. Multiplanar CT image reconstructions of the cervical spine were also generated. COMPARISON:  None. FINDINGS: CT HEAD FINDINGS Brain: There is no mass, hemorrhage or extra-axial collection. The size and configuration of the ventricles and extra-axial CSF spaces are normal. There is no acute or chronic infarction. There is hypoattenuation of the periventricular white matter, most commonly indicating chronic ischemic microangiopathy. Vascular: No abnormal hyperdensity of the major intracranial arteries or dural venous sinuses. No intracranial atherosclerosis. Skull: The visualized skull base, calvarium and extracranial soft tissues are normal. Sinuses/Orbits: No fluid levels or advanced mucosal thickening of the visualized paranasal sinuses. No mastoid or middle ear effusion. The orbits are normal. CT CERVICAL SPINE FINDINGS Alignment: No static subluxation. Facets are aligned. Occipital condyles  are normally positioned. Skull base and vertebrae: No acute fracture. Soft tissues and spinal canal: No prevertebral fluid or swelling. No visible canal hematoma. Disc levels: No advanced spinal canal or neural foraminal stenosis. Upper chest: No pneumothorax, pulmonary nodule or pleural effusion. Other: Normal visualized paraspinal cervical soft tissues. IMPRESSION: No acute intracranial abnormality. No fracture or static subluxation of the cervical spine. 1. Mild chronic small vessel disease. Electronically Signed   By: Ulyses Jarred M.D.   On: 09/28/2017 16:34   Ct Abdomen Pelvis W Contrast  Result Date: 09/28/2017 CLINICAL DATA:  Recurrent falls for 3 days. Macroscopic hematuria. Lethargy. History of HIV, diabetes, bowel surgery. EXAM: CT CHEST, ABDOMEN, AND PELVIS WITH CONTRAST TECHNIQUE: Multidetector CT imaging of the chest, abdomen and pelvis was performed following the standard protocol during bolus administration of intravenous contrast. CONTRAST:  179m ISOVUE-300 IOPAMIDOL (ISOVUE-300) INJECTION 61% COMPARISON:  CT abdomen and pelvis April 13, 2016. FINDINGS: CT CHEST FINDINGS CARDIOVASCULAR: Heart is upper limits of normal size. Trace pericardial effusion. Thoracic aorta is normal course and caliber, unremarkable. Pulmonary vascular congestion. MEDIASTINUM/NODES: No mediastinal mass. No lymphadenopathy by CT size criteria. Normal appearance of thoracic esophagus though not tailored for evaluation. LUNGS/PLEURA: Tracheobronchial tree is patent, no pneumothorax. No pleural effusions, focal consolidations, pulmonary nodules or masses. Bilateral lower lobe atelectasis. MUSCULOSKELETAL: Nonacute. Old LEFT ninth rib fracture. Mild spondylosis. CT ABDOMEN AND PELVIS FINDINGS HEPATOBILIARY: Diffusely hypodense liver compatible with steatosis. Enlarged, 24.8 cm in craniocaudad dimension. Normal gallbladder. PANCREAS: Fatty atrophy, punctate calcifications.  Nonacute. SPLEEN: Enlarged, 18.6 cm in  craniocaudad dimension. ADRENALS/URINARY TRACT: Kidneys are orthotopic, demonstrating symmetric enhancement. 2 mm LEFT lower pole nephrolithiasis. No hydronephrosis or solid renal masses. The unopacified ureters are normal in course and caliber. Delayed imaging through the kidneys demonstrates symmetric prompt contrast excretion within the proximal urinary collecting system. Urinary bladder is adequately distended and unremarkable. Normal adrenal glands. STOMACH/BOWEL: Status post partial gastrectomy. The stomach, small and large bowel are normal in caliber without inflammatory changes. Large amount of retained large bowel stool. Midline mobile cecum. Normal appendix. VASCULAR/LYMPHATIC: Aortoiliac vessels are normal in course and caliber. Mild calcific atherosclerosis. No lymphadenopathy by CT size criteria. REPRODUCTIVE: Prostate size is normal.  Punctate calcifications. OTHER: No intraperitoneal free fluid or free air. MUSCULOSKELETAL: Non-acute. Small fat containing  LEFT inguinal hernia. Bridging LEFT sacroiliac osteophyte. IMPRESSION: CT CHEST: 1. No CT findings of acute trauma. 2. Mild cardiomegaly and pulmonary vascular congestion. CT ABDOMEN AND PELVIS: 1. No CT findings of acute trauma. No acute intra-abdominopelvic process. 2. 2 mm nonobstructing LEFT nephrolithiasis. 3. Large amount of retained large bowel stool without bowel obstruction. 4. Worsening hepatosplenomegaly. Aortic Atherosclerosis (ICD10-I70.0). Electronically Signed   By: Elon Alas M.D.   On: 09/28/2017 16:37   Dg Chest Portable 1 View  Result Date: 09/28/2017 CLINICAL DATA:  Shortness of breath. EXAM: PORTABLE CHEST 1 VIEW COMPARISON:  Chest x-ray dated December 25, 2016. FINDINGS: The heart size and mediastinal contours are within normal limits. Both lungs are grossly clear. The visualized skeletal structures are unremarkable. IMPRESSION: No active disease. Electronically Signed   By: Titus Dubin M.D.   On: 09/28/2017 14:54      Procedures Procedures (including critical care time)  Medications Ordered in ED Medications  sodium chloride 0.9 % bolus 500 mL (0 mLs Intravenous Stopped 09/28/17 1440)  iopamidol (ISOVUE-300) 61 % injection 100 mL (100 mLs Intravenous Contrast Given 09/28/17 1541)     Initial Impression / Assessment and Plan / ED Course  I have reviewed the triage vital signs and the nursing notes.  Pertinent labs & imaging results that were available during my care of the patient were reviewed by me and considered in my medical decision making (see chart for details).   Patient with 2 falls recently.  No significant injuries seen on CT of head neck chest and abdomen and pelvis.  Patient mildly ataxic walking.  Patient does take Xanax and I suspect this this has something to do with he being somewhat unsteady.pt wants to go home.  He has been instructed to use a walker at all times.  And he states that his brother and his aunt lived with him to assist him.  Patient will follow up with his PCP   Final Clinical Impressions(s) / ED Diagnoses   Final diagnoses:  Fall, initial encounter    ED Discharge Orders    None       Milton Ferguson, MD 09/28/17 1756

## 2017-09-28 NOTE — ED Notes (Signed)
Pt's friend has arrived to take him home.  Pt is alert and oriented, but very unsteady.  MD is aware.

## 2017-09-28 NOTE — Discharge Instructions (Addendum)
Use a walker at all times when you ambulate.  Follow-up with your family doctor this week

## 2017-09-28 NOTE — ED Notes (Signed)
Gave patient's wallet to security. Counted cash in wallet with Artist as witness. Patient had $247.00 in wallet when given to security.

## 2017-09-28 NOTE — ED Notes (Signed)
Lab coming to draw blood work

## 2017-09-28 NOTE — ED Triage Notes (Signed)
Patient brought in by EMS for complaint of fall 3 times in the last 3 days. Patient states he rolled out of bed this morning and has been laying in the floor since 6am. Complaining of pain to neck, lower back, and bilateral flank.

## 2017-09-28 NOTE — ED Notes (Signed)
Pt keeps looking for his cell phone.  No staff saw a phone and belongings were given to security on arrival and pt signed for those prior to discharge.  Attempted to call phone and could not hear it ringing.  Pt continues to be drowsy and doze off frequently.  Can hardly hold self up in bed and is forgetful.  MD aware of condition prior to d/c and friend is comfortable taking pt home.  Assisted out by staff via wc

## 2017-09-30 ENCOUNTER — Telehealth: Payer: Self-pay | Admitting: *Deleted

## 2017-09-30 ENCOUNTER — Encounter (HOSPITAL_COMMUNITY): Payer: Self-pay | Admitting: Family Medicine

## 2017-09-30 ENCOUNTER — Other Ambulatory Visit: Payer: Self-pay

## 2017-09-30 ENCOUNTER — Emergency Department (HOSPITAL_COMMUNITY): Payer: PPO

## 2017-09-30 ENCOUNTER — Inpatient Hospital Stay (HOSPITAL_COMMUNITY)
Admission: EM | Admit: 2017-09-30 | Discharge: 2017-10-05 | DRG: 617 | Disposition: A | Discharge status: Home Health | Payer: PPO | Attending: Internal Medicine | Admitting: Internal Medicine

## 2017-09-30 DIAGNOSIS — N183 Chronic kidney disease, stage 3 unspecified: Secondary | ICD-10-CM | POA: Diagnosis present

## 2017-09-30 DIAGNOSIS — Z888 Allergy status to other drugs, medicaments and biological substances status: Secondary | ICD-10-CM

## 2017-09-30 DIAGNOSIS — Z794 Long term (current) use of insulin: Secondary | ICD-10-CM | POA: Diagnosis not present

## 2017-09-30 DIAGNOSIS — S0500XA Injury of conjunctiva and corneal abrasion without foreign body, unspecified eye, initial encounter: Secondary | ICD-10-CM | POA: Diagnosis present

## 2017-09-30 DIAGNOSIS — G4700 Insomnia, unspecified: Secondary | ICD-10-CM | POA: Diagnosis not present

## 2017-09-30 DIAGNOSIS — E1165 Type 2 diabetes mellitus with hyperglycemia: Secondary | ICD-10-CM | POA: Diagnosis not present

## 2017-09-30 DIAGNOSIS — E46 Unspecified protein-calorie malnutrition: Secondary | ICD-10-CM | POA: Diagnosis not present

## 2017-09-30 DIAGNOSIS — Z87891 Personal history of nicotine dependence: Secondary | ICD-10-CM | POA: Diagnosis not present

## 2017-09-30 DIAGNOSIS — E039 Hypothyroidism, unspecified: Secondary | ICD-10-CM | POA: Diagnosis not present

## 2017-09-30 DIAGNOSIS — L089 Local infection of the skin and subcutaneous tissue, unspecified: Secondary | ICD-10-CM | POA: Diagnosis present

## 2017-09-30 DIAGNOSIS — M86171 Other acute osteomyelitis, right ankle and foot: Secondary | ICD-10-CM | POA: Diagnosis present

## 2017-09-30 DIAGNOSIS — L97519 Non-pressure chronic ulcer of other part of right foot with unspecified severity: Secondary | ICD-10-CM | POA: Diagnosis present

## 2017-09-30 DIAGNOSIS — Z89411 Acquired absence of right great toe: Secondary | ICD-10-CM

## 2017-09-30 DIAGNOSIS — Z886 Allergy status to analgesic agent status: Secondary | ICD-10-CM | POA: Diagnosis not present

## 2017-09-30 DIAGNOSIS — E119 Type 2 diabetes mellitus without complications: Secondary | ICD-10-CM | POA: Diagnosis not present

## 2017-09-30 DIAGNOSIS — S99921A Unspecified injury of right foot, initial encounter: Secondary | ICD-10-CM | POA: Diagnosis not present

## 2017-09-30 DIAGNOSIS — S93129A Dislocation of metatarsophalangeal joint of unspecified toe(s), initial encounter: Secondary | ICD-10-CM | POA: Diagnosis present

## 2017-09-30 DIAGNOSIS — M8610 Other acute osteomyelitis, unspecified site: Secondary | ICD-10-CM

## 2017-09-30 DIAGNOSIS — M79676 Pain in unspecified toe(s): Secondary | ICD-10-CM | POA: Diagnosis not present

## 2017-09-30 DIAGNOSIS — X58XXXA Exposure to other specified factors, initial encounter: Secondary | ICD-10-CM | POA: Diagnosis present

## 2017-09-30 DIAGNOSIS — M869 Osteomyelitis, unspecified: Secondary | ICD-10-CM

## 2017-09-30 DIAGNOSIS — K5900 Constipation, unspecified: Secondary | ICD-10-CM

## 2017-09-30 DIAGNOSIS — R457 State of emotional shock and stress, unspecified: Secondary | ICD-10-CM | POA: Diagnosis not present

## 2017-09-30 DIAGNOSIS — F331 Major depressive disorder, recurrent, moderate: Secondary | ICD-10-CM | POA: Diagnosis present

## 2017-09-30 DIAGNOSIS — F909 Attention-deficit hyperactivity disorder, unspecified type: Secondary | ICD-10-CM | POA: Diagnosis present

## 2017-09-30 DIAGNOSIS — D62 Acute posthemorrhagic anemia: Secondary | ICD-10-CM | POA: Diagnosis not present

## 2017-09-30 DIAGNOSIS — E11628 Type 2 diabetes mellitus with other skin complications: Secondary | ICD-10-CM | POA: Diagnosis not present

## 2017-09-30 DIAGNOSIS — I10 Essential (primary) hypertension: Secondary | ICD-10-CM | POA: Diagnosis not present

## 2017-09-30 DIAGNOSIS — E1142 Type 2 diabetes mellitus with diabetic polyneuropathy: Secondary | ICD-10-CM | POA: Diagnosis present

## 2017-09-30 DIAGNOSIS — G4733 Obstructive sleep apnea (adult) (pediatric): Secondary | ICD-10-CM

## 2017-09-30 DIAGNOSIS — R0902 Hypoxemia: Secondary | ICD-10-CM | POA: Diagnosis not present

## 2017-09-30 DIAGNOSIS — S93126A Dislocation of metatarsophalangeal joint of unspecified lesser toe(s), initial encounter: Secondary | ICD-10-CM | POA: Diagnosis present

## 2017-09-30 DIAGNOSIS — I824Y9 Acute embolism and thrombosis of unspecified deep veins of unspecified proximal lower extremity: Secondary | ICD-10-CM | POA: Diagnosis not present

## 2017-09-30 DIAGNOSIS — E11621 Type 2 diabetes mellitus with foot ulcer: Secondary | ICD-10-CM | POA: Diagnosis not present

## 2017-09-30 DIAGNOSIS — L02611 Cutaneous abscess of right foot: Secondary | ICD-10-CM | POA: Diagnosis not present

## 2017-09-30 DIAGNOSIS — Z86718 Personal history of other venous thrombosis and embolism: Secondary | ICD-10-CM | POA: Diagnosis not present

## 2017-09-30 DIAGNOSIS — K219 Gastro-esophageal reflux disease without esophagitis: Secondary | ICD-10-CM | POA: Diagnosis not present

## 2017-09-30 DIAGNOSIS — E876 Hypokalemia: Secondary | ICD-10-CM | POA: Diagnosis not present

## 2017-09-30 DIAGNOSIS — E1122 Type 2 diabetes mellitus with diabetic chronic kidney disease: Secondary | ICD-10-CM | POA: Diagnosis not present

## 2017-09-30 DIAGNOSIS — F418 Other specified anxiety disorders: Secondary | ICD-10-CM | POA: Diagnosis not present

## 2017-09-30 DIAGNOSIS — R296 Repeated falls: Secondary | ICD-10-CM | POA: Diagnosis present

## 2017-09-30 DIAGNOSIS — Z21 Asymptomatic human immunodeficiency virus [HIV] infection status: Secondary | ICD-10-CM | POA: Diagnosis present

## 2017-09-30 DIAGNOSIS — L03115 Cellulitis of right lower limb: Secondary | ICD-10-CM | POA: Diagnosis not present

## 2017-09-30 DIAGNOSIS — W132XXA Fall from, out of or through roof, initial encounter: Secondary | ICD-10-CM | POA: Diagnosis present

## 2017-09-30 DIAGNOSIS — Z7901 Long term (current) use of anticoagulants: Secondary | ICD-10-CM

## 2017-09-30 DIAGNOSIS — B20 Human immunodeficiency virus [HIV] disease: Secondary | ICD-10-CM | POA: Diagnosis present

## 2017-09-30 DIAGNOSIS — E1169 Type 2 diabetes mellitus with other specified complication: Principal | ICD-10-CM | POA: Diagnosis present

## 2017-09-30 DIAGNOSIS — Z9989 Dependence on other enabling machines and devices: Secondary | ICD-10-CM | POA: Diagnosis not present

## 2017-09-30 DIAGNOSIS — E1151 Type 2 diabetes mellitus with diabetic peripheral angiopathy without gangrene: Secondary | ICD-10-CM | POA: Diagnosis not present

## 2017-09-30 DIAGNOSIS — K769 Liver disease, unspecified: Secondary | ICD-10-CM | POA: Diagnosis present

## 2017-09-30 DIAGNOSIS — S93124A Dislocation of metatarsophalangeal joint of right lesser toe(s), initial encounter: Secondary | ICD-10-CM

## 2017-09-30 DIAGNOSIS — M86471 Chronic osteomyelitis with draining sinus, right ankle and foot: Secondary | ICD-10-CM | POA: Diagnosis not present

## 2017-09-30 DIAGNOSIS — I82409 Acute embolism and thrombosis of unspecified deep veins of unspecified lower extremity: Secondary | ICD-10-CM

## 2017-09-30 DIAGNOSIS — Z79899 Other long term (current) drug therapy: Secondary | ICD-10-CM

## 2017-09-30 DIAGNOSIS — M7989 Other specified soft tissue disorders: Secondary | ICD-10-CM | POA: Diagnosis not present

## 2017-09-30 DIAGNOSIS — R609 Edema, unspecified: Secondary | ICD-10-CM | POA: Diagnosis not present

## 2017-09-30 DIAGNOSIS — Z8669 Personal history of other diseases of the nervous system and sense organs: Secondary | ICD-10-CM

## 2017-09-30 LAB — CBC WITH DIFFERENTIAL/PLATELET
BASOS PCT: 0 %
Basophils Absolute: 0 10*3/uL (ref 0.0–0.1)
EOS ABS: 0.3 10*3/uL (ref 0.0–0.7)
EOS PCT: 3 %
HCT: 41.7 % (ref 39.0–52.0)
HEMOGLOBIN: 14.7 g/dL (ref 13.0–17.0)
Lymphocytes Relative: 20 %
Lymphs Abs: 1.7 10*3/uL (ref 0.7–4.0)
MCH: 33.5 pg (ref 26.0–34.0)
MCHC: 35.3 g/dL (ref 30.0–36.0)
MCV: 95 fL (ref 78.0–100.0)
MONOS PCT: 17 %
Monocytes Absolute: 1.5 10*3/uL — ABNORMAL HIGH (ref 0.1–1.0)
NEUTROS PCT: 60 %
Neutro Abs: 5.1 10*3/uL (ref 1.7–7.7)
PLATELETS: 172 10*3/uL (ref 150–400)
RBC: 4.39 MIL/uL (ref 4.22–5.81)
RDW: 13.2 % (ref 11.5–15.5)
WBC: 8.6 10*3/uL (ref 4.0–10.5)

## 2017-09-30 LAB — COMPREHENSIVE METABOLIC PANEL
ALK PHOS: 83 U/L (ref 38–126)
ALT: 32 U/L (ref 0–44)
ANION GAP: 8 (ref 5–15)
AST: 30 U/L (ref 15–41)
Albumin: 3.3 g/dL — ABNORMAL LOW (ref 3.5–5.0)
BUN: 25 mg/dL — ABNORMAL HIGH (ref 8–23)
CHLORIDE: 103 mmol/L (ref 98–111)
CO2: 23 mmol/L (ref 22–32)
Calcium: 8.9 mg/dL (ref 8.9–10.3)
Creatinine, Ser: 1.39 mg/dL — ABNORMAL HIGH (ref 0.61–1.24)
GFR calc non Af Amer: 52 mL/min — ABNORMAL LOW (ref >60)
GLUCOSE: 182 mg/dL — AB (ref 70–99)
Potassium: 3.8 mmol/L (ref 3.5–5.1)
SODIUM: 134 mmol/L — AB (ref 135–145)
Total Bilirubin: 1.1 mg/dL (ref 0.3–1.2)
Total Protein: 7.5 g/dL (ref 6.5–8.1)

## 2017-09-30 MED ORDER — METRONIDAZOLE IN NACL 5-0.79 MG/ML-% IV SOLN
500.0000 mg | Freq: Three times a day (TID) | INTRAVENOUS | Status: DC
  Administered 2017-10-01 – 2017-10-04 (×10): 500 mg via INTRAVENOUS
  Filled 2017-09-30 (×11): qty 100

## 2017-09-30 MED ORDER — INSULIN GLARGINE 100 UNIT/ML ~~LOC~~ SOLN
75.0000 [IU] | Freq: Every day | SUBCUTANEOUS | Status: DC
  Filled 2017-09-30: qty 0.75

## 2017-09-30 MED ORDER — PREGABALIN 100 MG PO CAPS
100.0000 mg | ORAL_CAPSULE | Freq: Three times a day (TID) | ORAL | Status: DC
  Administered 2017-10-01 – 2017-10-05 (×14): 100 mg via ORAL
  Filled 2017-09-30 (×13): qty 1

## 2017-09-30 MED ORDER — ABACAVIR-DOLUTEGRAVIR-LAMIVUD 600-50-300 MG PO TABS
1.0000 | ORAL_TABLET | Freq: Every day | ORAL | Status: DC
  Administered 2017-10-01 – 2017-10-04 (×4): 1 via ORAL
  Filled 2017-09-30 (×5): qty 1

## 2017-09-30 MED ORDER — SODIUM CHLORIDE 0.9 % IV SOLN
2.0000 g | INTRAVENOUS | Status: DC
  Administered 2017-10-01 – 2017-10-04 (×4): 2 g via INTRAVENOUS
  Filled 2017-09-30 (×4): qty 20

## 2017-09-30 MED ORDER — SODIUM CHLORIDE 0.9 % IV SOLN: INTRAVENOUS | Status: AC

## 2017-09-30 MED ORDER — HYDROCODONE-ACETAMINOPHEN 5-325 MG PO TABS
1.0000 | ORAL_TABLET | ORAL | Status: DC | PRN
  Administered 2017-10-01 – 2017-10-02 (×3): 2 via ORAL
  Filled 2017-09-30 (×3): qty 2

## 2017-09-30 MED ORDER — FAMOTIDINE 20 MG PO TABS
20.0000 mg | ORAL_TABLET | Freq: Two times a day (BID) | ORAL | Status: DC | PRN
  Administered 2017-09-30 – 2017-10-04 (×3): 20 mg via ORAL
  Filled 2017-09-30 (×3): qty 1

## 2017-09-30 MED ORDER — DIVALPROEX SODIUM ER 500 MG PO TB24
500.0000 mg | ORAL_TABLET | Freq: Every day | ORAL | Status: DC
  Administered 2017-10-01 – 2017-10-04 (×4): 500 mg via ORAL
  Filled 2017-09-30 (×5): qty 1

## 2017-09-30 MED ORDER — VORTIOXETINE HBR 20 MG PO TABS
20.0000 mg | ORAL_TABLET | Freq: Every day | ORAL | Status: DC
  Administered 2017-10-01 – 2017-10-04 (×4): 20 mg via ORAL
  Filled 2017-09-30 (×5): qty 1

## 2017-09-30 MED ORDER — ALPRAZOLAM 0.5 MG PO TABS
1.0000 mg | ORAL_TABLET | Freq: Four times a day (QID) | ORAL | Status: DC | PRN
  Administered 2017-10-01 – 2017-10-04 (×5): 1 mg via ORAL
  Filled 2017-09-30 (×5): qty 2

## 2017-09-30 MED ORDER — ONDANSETRON HCL 4 MG PO TABS
4.0000 mg | ORAL_TABLET | Freq: Four times a day (QID) | ORAL | Status: DC | PRN
  Administered 2017-10-01: 4 mg via ORAL
  Filled 2017-09-30: qty 1

## 2017-09-30 MED ORDER — PIPERACILLIN-TAZOBACTAM 3.375 G IVPB 30 MIN
3.3750 g | Freq: Once | INTRAVENOUS | Status: AC
  Administered 2017-09-30: 3.375 g via INTRAVENOUS
  Filled 2017-09-30: qty 50

## 2017-09-30 MED ORDER — VANCOMYCIN HCL 10 G IV SOLR
2000.0000 mg | Freq: Once | INTRAVENOUS | Status: DC
  Filled 2017-09-30: qty 2000

## 2017-09-30 MED ORDER — BREXPIPRAZOLE 1 MG PO TABS
1.0000 mg | ORAL_TABLET | Freq: Every day | ORAL | Status: DC
  Administered 2017-10-01 – 2017-10-04 (×4): 1 mg via ORAL
  Filled 2017-09-30 (×5): qty 1

## 2017-09-30 MED ORDER — INSULIN ASPART 100 UNIT/ML ~~LOC~~ SOLN
0.0000 [IU] | SUBCUTANEOUS | Status: DC
  Administered 2017-10-02 – 2017-10-03 (×3): 3 [IU] via SUBCUTANEOUS
  Administered 2017-10-04: 2 [IU] via SUBCUTANEOUS
  Administered 2017-10-04: 3 [IU] via SUBCUTANEOUS
  Administered 2017-10-04: 5 [IU] via SUBCUTANEOUS
  Administered 2017-10-04 – 2017-10-05 (×2): 3 [IU] via SUBCUTANEOUS
  Administered 2017-10-05 (×2): 2 [IU] via SUBCUTANEOUS

## 2017-09-30 MED ORDER — HEPARIN (PORCINE) IN NACL 100-0.45 UNIT/ML-% IJ SOLN
1900.0000 [IU]/h | INTRAMUSCULAR | Status: AC
  Administered 2017-10-01 – 2017-10-02 (×4): 1600 [IU]/h via INTRAVENOUS
  Filled 2017-09-30 (×4): qty 250

## 2017-09-30 MED ORDER — ZOLPIDEM TARTRATE 5 MG PO TABS
10.0000 mg | ORAL_TABLET | Freq: Every day | ORAL | Status: DC
  Administered 2017-10-01 – 2017-10-04 (×5): 10 mg via ORAL
  Filled 2017-09-30 (×4): qty 2

## 2017-09-30 MED ORDER — LEVOTHYROXINE SODIUM 50 MCG PO TABS
50.0000 ug | ORAL_TABLET | Freq: Every day | ORAL | Status: DC
  Administered 2017-10-01 – 2017-10-04 (×5): 50 ug via ORAL
  Filled 2017-09-30 (×4): qty 1

## 2017-09-30 MED ORDER — ACETAMINOPHEN 325 MG PO TABS
650.0000 mg | ORAL_TABLET | Freq: Four times a day (QID) | ORAL | Status: DC | PRN
  Administered 2017-10-03: 650 mg via ORAL
  Filled 2017-09-30: qty 2

## 2017-09-30 MED ORDER — DICLOFENAC SODIUM 1 % TD GEL
2.0000 g | Freq: Two times a day (BID) | TRANSDERMAL | Status: DC
  Administered 2017-10-01 – 2017-10-05 (×8): 2 g via TOPICAL
  Filled 2017-09-30: qty 100

## 2017-09-30 MED ORDER — ACETAMINOPHEN 650 MG RE SUPP: 650.0000 mg | Freq: Four times a day (QID) | RECTAL | Status: DC | PRN

## 2017-09-30 MED ORDER — ONDANSETRON HCL 4 MG/2ML IJ SOLN: 4.0000 mg | Freq: Four times a day (QID) | INTRAMUSCULAR | Status: DC | PRN

## 2017-09-30 MED ORDER — SENNOSIDES-DOCUSATE SODIUM 8.6-50 MG PO TABS: 1.0000 | ORAL_TABLET | Freq: Every evening | ORAL | Status: DC | PRN

## 2017-09-30 MED ORDER — PROTRIPTYLINE HCL 10 MG PO TABS
10.0000 mg | ORAL_TABLET | Freq: Three times a day (TID) | ORAL | Status: DC
  Filled 2017-09-30 (×3): qty 1

## 2017-09-30 NOTE — Progress Notes (Signed)
ANTICOAGULATION CONSULT NOTE - Initial Consult  Pharmacy Consult for heparin Indication: DVT  Allergies  Allergen Reactions  . Aspirin Swelling  . Ibuprofen Swelling  . Sustiva [Efavirenz] Swelling and Rash  . Nsaids Other (See Comments)    unknwn    Patient Measurements: Height: 5' 11"  (180.3 cm) Weight: 255 lb (115.7 kg) IBW/kg (Calculated) : 75.3 Heparin Dosing Weight: 100 kg  Vital Signs: Temp: 98.1 F (36.7 C) (08/07 2150) Temp Source: Oral (08/07 2150) BP: 119/71 (08/07 2123) Pulse Rate: 88 (08/07 2123)  Labs: Recent Labs    09/28/17 1344 09/28/17 1357 09/30/17 1816  HGB 14.6 13.6 14.7  HCT 42.0 40.0 41.7  PLT 108*  --  172  CREATININE 1.45* 1.50* 1.39*    Estimated Creatinine Clearance: 69.5 mL/min (A) (by C-G formula based on SCr of 1.39 mg/dL (H)).   Medical History: Past Medical History:  Diagnosis Date  . ADHD (attention deficit hyperactivity disorder)   . Anxiety   . Chronic kidney disease   . Clotting disorder ([place])   . Depression   . Diabetes mellitus without complication ([place])   . Diabetes mellitus, type II ([place])   . Dizziness 03/17/2015  . GERD (gastroesophageal reflux disease)   . HIV disease ([place])   . HIV infection ([place])   . Liver disease   . OSA (obstructive sleep apnea) 07/25/2015   Uses CPAP regularly  . Peripheral vascular disease ([place])   . Ulcer      Assessment:  41 yom with increased swelling of 2nd right toe. Holding Xarelto and starting heparin infusion in anticipation of possible surgery tomorrow.   Goal of Therapy:  Heparin level 0.3-0.7 units/ml Monitor platelets by anticoagulation protocol: Yes   Plan:  No bolus Start heparin infusion at 1600 units/hr Check anti-Xa level in 6-8 hours and daily while on heparin Continue to monitor H&H and platelets      Despina Pole 09/30/2017,11:10 PM

## 2017-09-30 NOTE — Telephone Encounter (Signed)
Ok. Thanks!

## 2017-09-30 NOTE — ED Notes (Signed)
Pt has taken some of his home medications, but states he did not take Xarelto (last taken yesterday/last night)  Pt states he took his Triumec, lyrica, Levothyroxine, tritellix, rexulti, and arthyrozene around 2300 tonight.

## 2017-09-30 NOTE — Telephone Encounter (Signed)
Patient call the office stating that his toe is all blown up and leaking a bloody drainage.  Patient states was still having slight drainage from phone call last week Saturday when he changed it. States that he fell out of a tree on Saturday morning and has not been paying close attention to his foot.   Patient states that he did not check yesterday so is not sure when it blew up. At fist offered to bring patient in Thursday morning but the more he talked about what he was seeing I recommended he go straight to the closest emergency room.  Patient states that he will be going to [facility].  Requested patient keep [name] updated.

## 2017-09-30 NOTE — ED Notes (Signed)
Two sets of blood cultures drawn prior to antibiotic administration

## 2017-09-30 NOTE — ED Triage Notes (Signed)
Pt here for check of right great toe. Increased drainage per patient.

## 2017-09-30 NOTE — H&P (Signed)
History and Physical    Daniel [name] [facility] MMH:[id number] DOB: [birth year] DOA: 09/30/2017  PCP: Daniel Agreste, MD   Patient coming from: Home   Chief Complaint: Right second toe swelling and drainage   HPI: Daniel [name] is a 62 y.o. male with medical history significant for HIV (CD4 590 and VL undetectable in March '19), depression with anxiety and insomnia, chronic kidney disease stage III, insulin-dependent diabetes mellitus, history of DVT on Xarelto, hypothyroidism, and OSA, presented to the emergency department for evaluation of increasing swelling and drainage from his second toe on the right foot.  Patient has history of right first toe amputation and has been following with podiatry for ulcers on the right foot.  He fell on 09/26/2017 and was evaluated in the emergency department with negative imaging and eventually discharged home, but he was not complaining of any foot problems at that time.  Since then, he has noticed increasing swelling and drainage from the second toe on the right foot.  He denies any pain, noting that he does not have sensation in his feet.  He has not appreciated any fevers or chills.  Denies chest pain, palpitations, or shortness of breath.  Had been on doxycycline for right foot ulcers, but completed that.  ED Course: Upon arrival to the ED, patient is found to be afebrile, saturating well on room air, and with vitals otherwise normal.  Chemistry panel is notable for slight hyponatremia and creatinine 1.39, similar to priors.  CBC is unremarkable.  Plain radiographs of the right foot demonstrate dislocation of the second MTP with possible osteomyelitis involving the second and third digits.  Blood cultures were collected in the ED and the patient was started on empiric Zosyn.  Orthopedic surgery was consulted and recommended a medical admission to [facility].  Patient remains hemodynamically stable, in no apparent respiratory distress, and will be admitted to the  medical-surgical unit at [facility] for ongoing evaluation and management of foot infection in a diabetic with concern for underlying osteomyelitis.  Review of Systems:  All other systems reviewed and apart from HPI, are negative.  Past Medical History:  Diagnosis Date  . ADHD (attention deficit hyperactivity disorder)   . Anxiety   . Chronic kidney disease   . Clotting disorder ([place])   . Depression   . Diabetes mellitus without complication ([place])   . Diabetes mellitus, type II ([place])   . Dizziness 03/17/2015  . GERD (gastroesophageal reflux disease)   . HIV disease ([place])   . HIV infection ([place])   . Liver disease   . OSA (obstructive sleep apnea) 07/25/2015   Uses CPAP regularly  . Peripheral vascular disease ([place])   . Ulcer     Past Surgical History:  Procedure Laterality Date  . SMALL INTESTINE SURGERY    . STOMACH SURGERY    . TOE AMPUTATION Right 08/2016   right great toe     reports that he quit smoking about 3 years ago. His smoking use included cigars. He has a 1.00 pack-year smoking history. He has never used smokeless tobacco. He reports that he does not drink alcohol or use drugs.  Allergies  Allergen Reactions  . Aspirin Swelling  . Ibuprofen Swelling  . Sustiva [Efavirenz] Swelling and Rash  . Nsaids Other (See Comments)    unknwn    Family History  Problem Relation Age of Onset  . Depression Brother   . Throat cancer Brother  half brother, never smoker  . COPD Mother   . Diabetes Neg Hx      Prior to Admission medications   Medication Sig Start Date End Date Taking? Authorizing Provider  abacavir-dolutegravir-lamiVUDine (TRIUMEQ) 600-50-300 MG tablet Take 1 tablet by mouth daily. Patient taking differently: Take 1 tablet by mouth at bedtime.  06/09/17  Yes Comer, Okey Regal, MD  acetaminophen (TYLENOL) 500 MG tablet Take 1,000-1,500 mg by mouth every 8 (eight) hours as needed for moderate pain.    Yes [provider]    ALPRAZolam Duanne Moron) 1 MG tablet Take 1 mg by mouth 4 (four) times daily as needed for anxiety.    Yes [provider]  amphetamine-dextroamphetamine (ADDERALL) 30 MG tablet Take 30 mg by mouth 3 (three) times daily.   Yes [provider]  Brexpiprazole (REXULTI) 1 MG TABS Take 1 mg by mouth at bedtime.   Yes [provider]  diclofenac sodium (VOLTAREN) 1 % GEL APPLY 2 GRAMS TO EACH KNEE IN THE MORNING AND AT BEDTIME AND APPLY 1 GRAM TO EACH KNEE IN THE AFTERNOON. 05/16/17  Yes Daniel Agreste, MD  divalproex (DEPAKOTE ER) 500 MG 24 hr tablet TAKE 1 TABLET BY MOUTH EVERY NIGHT AT BEDTIME 09/15/17  Yes Marcial Pacas, MD  insulin aspart (NOVOLOG FLEXPEN) 100 UNIT/ML FlexPen 10 units with breakfast, and 20 units with evening meal Patient taking differently: Inject 10-20 Units into the skin See admin instructions. 10 units with breakfast, and 20 units with evening meal 09/21/17  Yes Renato Shin, MD  Insulin Glargine (LANTUS SOLOSTAR) 100 UNIT/ML Solostar Pen Inject 150 Units into the skin every morning. 09/21/17  Yes Renato Shin, MD  levothyroxine (SYNTHROID, LEVOTHROID) 50 MCG tablet Take 1 tablet (50 mcg total) by mouth at bedtime. 07/24/15  Yes [name], MD  LYRICA 100 MG capsule TAKE 1 CAPSULE BY MOUTH THREE TIMES DAILY 09/22/17  Yes Daniel Agreste, MD  ondansetron (ZOFRAN) 8 MG tablet Take 8 mg by mouth every 8 (eight) hours as needed for nausea or vomiting.   Yes [provider]  protriptyline (VIVACTIL) 10 MG tablet Take 10 mg by mouth 3 (three) times daily.  01/30/16  Yes [provider]  ranitidine (ZANTAC) 150 MG tablet Take 150 mg by mouth daily as needed.    Yes [provider]  TRINTELLIX 20 MG TABS Take 20 mg by mouth at bedtime. 01/26/15  Yes [provider]  XARELTO 15 MG TABS tablet TAKE 1 TABLET BY MOUTH EVERY DAY 09/22/17  Yes Daniel Agreste, MD  zolpidem (AMBIEN) 10 MG tablet Take 10 mg by mouth at bedtime.    Yes  [provider]  Continuous Blood Gluc Receiver (FREESTYLE LIBRE 14 DAY READER) DEVI AS DIRECTED 08/13/17   Renato Shin, MD  Continuous Blood Gluc Sensor (FREESTYLE LIBRE 14 DAY SENSOR) MISC 1 Device by Does not apply route every 14 (fourteen) days. 07/15/17   Renato Shin, MD  glucose blood (FREESTYLE PRECISION NEO TEST) test strip Used to check blood sugars twice daily. 07/28/17   Renato Shin, MD  Insulin Pen Needle (B-D ULTRAFINE III SHORT PEN) 31G X 8 MM MISC USE TO INJECT LANTUS AND NOVOLOG DAILY 09/15/17   Renato Shin, MD  UNABLE TO FIND CPAP MACHINE with standard Aclaim nasal mask with humidifier. Set at 14 cwp Patient taking differently: CPAP MACHINE with standard Aclaim nasal mask with humidifier. Set at 4 cwp 04/12/13   Daub, Loura Back, MD  Physical Exam: Vitals:   09/30/17 1756 09/30/17 1757 09/30/17 2123 09/30/17 2150  BP:  121/62 119/71   Pulse:  93 88   Resp:  18 16   Temp:  98.4 F (36.9 C)  98.1 F (36.7 C)  TempSrc:  Oral  Oral  SpO2:  95% 98%   Weight: 115.7 kg (255 lb)     Height: 5' 11"  (1.803 m)         Constitutional: NAD, calm  Eyes: PERTLA, lids and conjunctivae normal ENMT: Mucous membranes are moist. Posterior pharynx clear of any exudate or lesions.   Neck: normal, supple, no masses, no thyromegaly Respiratory: clear to auscultation bilaterally, no wheezing, no crackles. Normal respiratory effort.  Cardiovascular: S1 & S2 heard, regular rate and rhythm. No significant JVD. Abdomen: No distension, no tenderness, soft. Bowel sounds normal.  Musculoskeletal: Status-post 1st toe amputation on right. No joint deformity upper and lower extremities.    Skin: Right second toe is markedly swollen with splitting of skin and serosanguinous drainage; there is surrounding erythema and heat involving the dorsal right foot. Warm, dry, well-perfused. Neurologic: No dysarthria or aphasia. PERRL. Sensation diminished in feet bilaterally. Moving all extremities.    Psychiatric: Alert and oriented x 3. Calm, cooperative.     Labs on Admission: I have personally reviewed following labs and imaging studies  CBC: Recent Labs  Lab 09/28/17 1344 09/28/17 1357 09/30/17 1816  WBC 10.7*  --  8.6  NEUTROABS 8.0*  --  5.1  HGB 14.6 13.6 14.7  HCT 42.0 40.0 41.7  MCV 96.1  --  95.0  PLT 108*  --  903   Basic Metabolic Panel: Recent Labs  Lab 09/28/17 1344 09/28/17 1357 09/30/17 1816  NA 134* 137 134*  K 4.1 4.3 3.8  CL 102 103 103  CO2 26  --  23  GLUCOSE 181* 177* 182*  BUN 22 21 25*  CREATININE 1.45* 1.50* 1.39*  CALCIUM 8.4*  --  8.9   GFR: Estimated Creatinine Clearance: 69.5 mL/min (A) (by C-G formula based on SCr of 1.39 mg/dL (H)). Liver Function Tests: Recent Labs  Lab 09/28/17 1344 09/30/17 1816  AST 22 30  ALT 26 32  ALKPHOS 77 83  BILITOT 1.3* 1.1  PROT 6.6 7.5  ALBUMIN 3.1* 3.3*   No results for input(s): LIPASE, AMYLASE in the last 168 hours. No results for input(s): AMMONIA in the last 168 hours. Coagulation Profile: No results for input(s): INR, PROTIME in the last 168 hours. Cardiac Enzymes: No results for input(s): CKTOTAL, CKMB, CKMBINDEX, TROPONINI in the last 168 hours. BNP (last 3 results) Recent Labs    12/11/16 1853  PROBNP 31   HbA1C: No results for input(s): HGBA1C in the last 72 hours. CBG: No results for input(s): GLUCAP in the last 168 hours. Lipid Profile: No results for input(s): CHOL, HDL, LDLCALC, TRIG, CHOLHDL, LDLDIRECT in the last 72 hours. Thyroid Function Tests: No results for input(s): TSH, T4TOTAL, FREET4, T3FREE, THYROIDAB in the last 72 hours. Anemia Panel: No results for input(s): VITAMINB12, FOLATE, FERRITIN, TIBC, IRON, RETICCTPCT in the last 72 hours. Urine analysis:    Component Value Date/Time   COLORURINE YELLOW 09/28/2017 [place] 09/28/2017 1327   LABSPEC 1.025 09/28/2017 1327   PHURINE 6.0 09/28/2017 1327   GLUCOSEU >=500 (A) 09/28/2017 1327    GLUCOSEU NEG mg/dL 08/21/2006 0220   HGBUR MODERATE (A) 09/28/2017 1327   HGBUR trace-intact 01/01/2007 1340   BILIRUBINUR NEGATIVE 09/28/2017 1327  BILIRUBINUR negative 07/11/2015 1413   BILIRUBINUR neg 08/09/2014 1401   KETONESUR NEGATIVE 09/28/2017 1327   PROTEINUR 100 (A) 09/28/2017 1327   UROBILINOGEN 1.0 07/11/2015 1413   UROBILINOGEN 0.2 01/01/2007 1340   NITRITE NEGATIVE 09/28/2017 1327   LEUKOCYTESUR NEGATIVE 09/28/2017 1327   Sepsis Labs: @LABRCNTIP (procalcitonin:4,lacticidven:4) ) Recent Results (from the past 240 hour(s))  Blood culture (routine x 2)     Status: None (Preliminary result)   Collection Time: 09/30/17  6:12 PM  Result Value Ref Range Status   Specimen Description BLOOD RIGHT ARM  Final   Special Requests   Final    BOTTLES DRAWN AEROBIC AND ANAEROBIC Blood Culture adequate volume Performed at [facility], [address] [place], [place] [address]    Culture PENDING  Incomplete   Report Status PENDING  Incomplete  Blood culture (routine x 2)     Status: None (Preliminary result)   Collection Time: 09/30/17  9:34 PM  Result Value Ref Range Status   Specimen Description RIGHT ANTECUBITAL  Final   Special Requests   Final    BOTTLES DRAWN AEROBIC AND ANAEROBIC Blood Culture adequate volume Performed at [facility], [address] [place], [place] [address]    Culture PENDING  Incomplete   Report Status PENDING  Incomplete     Radiological Exams on Admission: Dg Foot Complete Right  Result Date: 09/30/2017 CLINICAL DATA:  61 year old diabetic with 2-3 month history of blistering of the RIGHT foot after a deep treatment procedure. Currently patient has open wounds on the foot with marked swelling. Prior RIGHT great toe amputation. EXAM: RIGHT FOOT COMPLETE - 3+ VIEW COMPARISON:  09/30/2016, 06/27/2016 and earlier. FINDINGS: Severe diffuse soft tissue swelling, especially involving the great toe stump and the second toe. Dislocation of the second MTP  joint, with slight osteolysis of the head of the proximal phalanx of the second toe. Flattening of the head of the third metatarsal with subchondral cyst formation since the prior examination 1 year ago. Joint space narrowing involving the fourth MTP joint, also new since the examination 1 year ago. Remaining joint spaces well-preserved. Mild osseous demineralization. IMPRESSION: 1. Dislocation of the second MTP joint with possible osteomyelitis involving the base of the proximal phalanx of the second toe. 2. Flattening of the head of the third metatarsal, new since the examination 1 year ago, possibly related to osteonecrosis. 3. Developing osteoarthritis involving the fourth MTP joint. 4. Soft tissue swelling involving the right great toe stump and the second toe, associated with ulcerations. Electronically Signed   By: Evangeline Dakin M.D.   On: 09/30/2017 20:59    EKG: Not performed.   Assessment/Plan   1. Diabetic foot infection  - Presents with increased swelling and drainage from right 2nd toe  - Radiographs reveal dislocation of second MTP, possibly chronic, as well as possible osteomyelitis  - Orthopedic surgery was consulted by ED physician and recommended admission to [facility]  - Blood cultures were collected in ED and empiric Zosyn was started  - Check inflammatory markers, follow cultures, continue empiric antibiotics with Rocephin, Flagy, and vancomycin    2. Insulin-dependent DM  - A1c was 7.6% last month  - Managed at home with Lantus 150 units qAM, and Novolog 10 units qAM and 20 qPM  - He will be NPO after midnight  - Check CBG's, continue Lantus with 75 units qAM and Novolog per sliding-scale to start    3. HIV  - CD4 was 590 and viral load  undetectable in March  - Continue Triumeq    4. History of DVT  - No evidence for acute VTE  - Hold Xarelto and use IV heparin for now in anticipation of orthopedic surgery    5. Depression, anxiety, insomnia  - Stable    - Continue current medications   6. OSA  - Continue CPAP qHS    7. Hypothyroidism  - Continue Synthroid   8. CKD stage III  - SCR is 1.39 on admission, consistent with apparent baseline  - Renally-dose medications    DVT prophylaxis: IV heparin infusion  Code Status: Full  Family Communication: Discussed with patient  Consults called: Orthopedic surgery Admission status: Inpatient     Vianne Bulls, MD [facility] Pager [phone]  If 7PM-7AM, please contact night-coverage [email or web address] Password [facility]  09/30/2017, 10:37 PM

## 2017-09-30 NOTE — Progress Notes (Signed)
ANTIBIOTIC CONSULT NOTE - INITIAL  Pharmacy Consult for vancomycin Indication: diabetic foot infection  Allergies  Allergen Reactions  . Aspirin Swelling  . Ibuprofen Swelling  . Sustiva [Efavirenz] Swelling and Rash  . Nsaids Other (See Comments)    unknwn    Patient Measurements: Height: 5' 11"  (180.3 cm) Weight: 255 lb (115.7 kg) IBW/kg (Calculated) : 75.3 Adjusted Body Weight:   Vital Signs: Temp: 98.1 F (36.7 C) (08/07 2150) Temp Source: Oral (08/07 2150) BP: 119/71 (08/07 2123) Pulse Rate: 88 (08/07 2123) Intake/Output from previous day: No intake/output data recorded. Intake/Output from this shift: Total I/O In: 80 [IV Piggyback:80] Out: -   Labs: Recent Labs    09/28/17 1344 09/28/17 1357 09/30/17 1816  WBC 10.7*  --  8.6  HGB 14.6 13.6 14.7  PLT 108*  --  172  CREATININE 1.45* 1.50* 1.39*   Estimated Creatinine Clearance: 69.5 mL/min (A) (by C-G formula based on SCr of 1.39 mg/dL (H)). No results for input(s): VANCOTROUGH, VANCOPEAK, VANCORANDOM, GENTTROUGH, GENTPEAK, GENTRANDOM, TOBRATROUGH, TOBRAPEAK, TOBRARND, AMIKACINPEAK, AMIKACINTROU, AMIKACIN in the last 72 hours.   Microbiology: Recent Results (from the past 720 hour(s))  Blood culture (routine x 2)     Status: None (Preliminary result)   Collection Time: 09/30/17  6:12 PM  Result Value Ref Range Status   Specimen Description BLOOD RIGHT ARM  Final   Special Requests   Final    BOTTLES DRAWN AEROBIC AND ANAEROBIC Blood Culture adequate volume Performed at [facility], [address] [place], [place] [address]    Culture PENDING  Incomplete   Report Status PENDING  Incomplete  Blood culture (routine x 2)     Status: None (Preliminary result)   Collection Time: 09/30/17  9:34 PM  Result Value Ref Range Status   Specimen Description RIGHT ANTECUBITAL  Final   Special Requests   Final    BOTTLES DRAWN AEROBIC AND ANAEROBIC Blood Culture adequate volume Performed at [facility],  [address], [place],  [address]    Culture PENDING  Incomplete   Report Status PENDING  Incomplete    Medical History: Past Medical History:  Diagnosis Date  . ADHD (attention deficit hyperactivity disorder)   . Anxiety   . Chronic kidney disease   . Clotting disorder ([place])   . Depression   . Diabetes mellitus without complication ([place])   . Diabetes mellitus, type II ([place])   . Dizziness 03/17/2015  . GERD (gastroesophageal reflux disease)   . HIV disease ([place])   . HIV infection ([place])   . Liver disease   . OSA (obstructive sleep apnea) 07/25/2015   Uses CPAP regularly  . Peripheral vascular disease ([place])   . Ulcer     Assessment: 29 yom with diabetic foot infection and history of MRSA.    Plan:  Give vancomycin 2g IV x1 dose Await further floor orders  Despina Pole 09/30/2017,11:17 PM

## 2017-09-30 NOTE — Progress Notes (Signed)
Reviewed case with Dr. Tomi Bamberger. Evaluated x-rays and photo in chart. Diabetic male with infected second toe. Will need IV antibiotics, admission and discussion of possible amputation. Recommend transfer to [facility] with formal ortho consult to follow in the AM.  Shona Needles, MD [facility] [phone] (phone)

## 2017-09-30 NOTE — ED Provider Notes (Signed)
[facility] EMERGENCY DEPARTMENT Provider Note   CSN: [id number] Arrival date & time: 09/30/17  1751     History   Chief Complaint Chief Complaint  Patient presents with  . Wound Check    HPI Daniel [name] is a 62 y.o. male.  HPI Patient presents to the emergency room for evaluation of a foot injury.  Patient states he fell off a roof on Saturday.  Since then he has had some trouble with a headache as well as intermittent pains in his abdomen.  He also has had some pain and swelling in his right foot.  Patient initially did not mention that he was seen in the emergency room on August 5 for the same event but after I reviewed the medical records and mention this to him and the patient recalled that he was seen.  A full evaluation including CT scans from his head to his pelvis.  No acute traumatic findings were noted.  Patient however noticed increasing swelling of the 2nd toe on his right foot.  Patient has a history of prior amputations.  He states that his toe was swollen prior to the fall but since then it has exploded.  He denies any fevers or chills.  No vomiting or diarrhea. Past Medical History:  Diagnosis Date  . ADHD (attention deficit hyperactivity disorder)   . Anxiety   . Chronic kidney disease   . Clotting disorder ([place])   . Depression   . Diabetes mellitus without complication ([place])   . Diabetes mellitus, type II ([place])   . Dizziness 03/17/2015  . GERD (gastroesophageal reflux disease)   . HIV disease ([place])   . HIV infection ([place])   . Liver disease   . OSA (obstructive sleep apnea) 07/25/2015   Uses CPAP regularly  . Peripheral vascular disease ([place])   . Ulcer     Patient Active Problem List   Diagnosis Date Noted  . Morbid obesity due to excess calories ([place]) complicated by DM / hyperlipidemia 01/15/2017  . DOE (dyspnea on exertion) 01/13/2017  . Confusion 09/17/2016  . Chronic migraine 09/17/2016  . Gait abnormality 09/17/2016  . Diabetic foot infection  ([place]) 06/28/2016  . Screening examination for venereal disease 02/11/2016  . Critical lower limb ischemia 12/07/2015  . Fall 12/05/2015  . Toe ulcer, right ([place]) 09/19/2015  . Decreased pedal pulses 09/19/2015  . OSA on CPAP 09/05/2015  . Major depressive disorder, recurrent episode, moderate ([place]) 09/05/2015  . OSA (obstructive sleep apnea) 07/25/2015  . Low back pain 06/11/2015  . Abnormality of gait 06/11/2015  . Dizziness 03/17/2015  . Weakness 02/21/2015  . Chronic renal insufficiency, stage III (moderate) () 08/09/2014  . Diabetes ([place]) 11/07/2013  . Hematuria 06/21/2013  . Hepatic steatosis 09/09/2010  . Human immunodeficiency virus (HIV) disease ([place]) 06/04/2006  . HERPES ZOSTER, UNCOMPLICATED 93/57/0177  . THROMBOPHLEBITIS NOS 06/04/2006  . GERD 06/04/2006  . ARTHRITIS, HAND 06/04/2006    Past Surgical History:  Procedure Laterality Date  . SMALL INTESTINE SURGERY    . STOMACH SURGERY    . TOE AMPUTATION Right 08/2016   right great toe        Home Medications    Prior to Admission medications   Medication Sig Start Date End Date Taking? Authorizing Provider  abacavir-dolutegravir-lamiVUDine (TRIUMEQ) 600-50-300 MG tablet Take 1 tablet by mouth daily. Patient taking differently: Take 1 tablet by mouth at bedtime.  06/09/17  Yes Comer, Okey Regal, MD  acetaminophen (TYLENOL) 500 MG tablet Take  1,000-1,500 mg by mouth every 8 (eight) hours as needed for moderate pain.    Yes [provider]  ALPRAZolam Duanne Moron) 1 MG tablet Take 1 mg by mouth 4 (four) times daily as needed for anxiety.    Yes [provider]  amphetamine-dextroamphetamine (ADDERALL) 30 MG tablet Take 30 mg by mouth 3 (three) times daily.   Yes [provider]  Brexpiprazole (REXULTI) 1 MG TABS Take 1 mg by mouth at bedtime.   Yes [provider]  diclofenac sodium (VOLTAREN) 1 % GEL APPLY 2 GRAMS TO EACH KNEE IN THE MORNING AND AT BEDTIME AND APPLY 1 GRAM TO EACH  KNEE IN THE AFTERNOON. 05/16/17  Yes [name], MD  divalproex (DEPAKOTE ER) 500 MG 24 hr tablet TAKE 1 TABLET BY MOUTH EVERY NIGHT AT BEDTIME 09/15/17  Yes Marcial Pacas, MD  insulin aspart (NOVOLOG FLEXPEN) 100 UNIT/ML FlexPen 10 units with breakfast, and 20 units with evening meal Patient taking differently: Inject 10-20 Units into the skin See admin instructions. 10 units with breakfast, and 20 units with evening meal 09/21/17  Yes Renato Shin, MD  Insulin Glargine (LANTUS SOLOSTAR) 100 UNIT/ML Solostar Pen Inject 150 Units into the skin every morning. 09/21/17  Yes Renato Shin, MD  levothyroxine (SYNTHROID, LEVOTHROID) 50 MCG tablet Take 1 tablet (50 mcg total) by mouth at bedtime. 07/24/15  Yes [name], MD  LYRICA 100 MG capsule TAKE 1 CAPSULE BY MOUTH THREE TIMES DAILY 09/22/17  Yes [name], MD  ondansetron (ZOFRAN) 8 MG tablet Take 8 mg by mouth every 8 (eight) hours as needed for nausea or vomiting.   Yes [provider]  protriptyline (VIVACTIL) 10 MG tablet Take 10 mg by mouth 3 (three) times daily.  01/30/16  Yes [provider]  ranitidine (ZANTAC) 150 MG tablet Take 150 mg by mouth daily as needed.    Yes [provider]  TRINTELLIX 20 MG TABS Take 20 mg by mouth at bedtime. 01/26/15  Yes [provider]  XARELTO 15 MG TABS tablet TAKE 1 TABLET BY MOUTH EVERY DAY 09/22/17  Yes [name], MD  zolpidem (AMBIEN) 10 MG tablet Take 10 mg by mouth at bedtime.    Yes [provider]  Continuous Blood Gluc Receiver (FREESTYLE LIBRE 14 DAY READER) DEVI AS DIRECTED 08/13/17   Renato Shin, MD  Continuous Blood Gluc Sensor (FREESTYLE LIBRE 14 DAY SENSOR) MISC 1 Device by Does not apply route every 14 (fourteen) days. 07/15/17   Renato Shin, MD  glucose blood (FREESTYLE PRECISION NEO TEST) test strip Used to check blood sugars twice daily. 07/28/17   Renato Shin, MD  Insulin Pen Needle (B-D ULTRAFINE III SHORT PEN) 31G X 8 MM  MISC USE TO INJECT LANTUS AND NOVOLOG DAILY 09/15/17   Renato Shin, MD  ondansetron (ZOFRAN-ODT) 4 MG disintegrating tablet DISSOLVE 2 TABLETS UNDER THE TONGUE EVERY 8 HOURS AS NEEDED Patient not taking: Reported on 09/28/2017 08/13/17   Thayer Headings, MD  UNABLE TO FIND CPAP MACHINE with standard Aclaim nasal mask with humidifier. Set at 14 cwp Patient taking differently: CPAP MACHINE with standard Aclaim nasal mask with humidifier. Set at 4 cwp 04/12/13   [name], MD    Family History Family History  Problem Relation Age of Onset  . Depression Brother   . Throat cancer Brother        half brother, never smoker  . COPD Mother   . Diabetes Neg Hx  Social History Social History   Tobacco Use  . Smoking status: Former Smoker    Packs/day: 0.10    Years: 10.00    Pack years: 1.00    Types: Cigars    Last attempt to quit: 08/09/2014    Years since quitting: 3.1  . Smokeless tobacco: Never Used  Substance Use Topics  . Alcohol use: No    Alcohol/week: 0.0 oz  . Drug use: No     Allergies   Aspirin; Ibuprofen; Sustiva [efavirenz]; and Nsaids   Review of Systems Review of Systems  All other systems reviewed and are negative.    Physical Exam Updated Vital Signs BP 119/71   Pulse 88   Temp 98.1 F (36.7 C) (Oral)   Resp 16   Ht 1.803 m (5' 11" )   Wt 115.7 kg (255 lb)   SpO2 98%   BMI 35.57 kg/m   Physical Exam  Constitutional: No distress.  Overweight  HENT:  Head: Normocephalic and atraumatic.  Right Ear: External ear normal.  Left Ear: External ear normal.  Eyes: Conjunctivae are normal. Right eye exhibits no discharge. Left eye exhibits no discharge. No scleral icterus.  Neck: Neck supple. No tracheal deviation present.  Cardiovascular: Normal rate, regular rhythm and intact distal pulses.  Pulmonary/Chest: Effort normal and breath sounds normal. No stridor. No respiratory distress. He has no wheezes. He has no rales.  Abdominal: Soft. Bowel  sounds are normal. He exhibits no distension. There is no tenderness. There is no rebound and no guarding.  Musculoskeletal: He exhibits edema, tenderness and deformity.  2nd toe is edematous and ecchymotic, at least twice the size of the normal circumference, patient does have what appears missing epithelial tissue with exposed underlying grandulation, subq tissue  Neurological: He is alert. He has normal strength. No cranial nerve deficit (no facial droop, extraocular movements intact, no slurred speech) or sensory deficit. He exhibits normal muscle tone. He displays no seizure activity. Coordination normal.  Skin: Skin is warm and dry. No rash noted.  Psychiatric: He has a normal mood and affect.  Nursing note and vitals reviewed.      ED Treatments / Results  Labs (all labs ordered are listed, but only abnormal results are displayed) Labs Reviewed  CBC WITH DIFFERENTIAL/PLATELET - Abnormal; Notable for the following components:      Result Value   Monocytes Absolute 1.5 (*)    All other components within normal limits  COMPREHENSIVE METABOLIC PANEL - Abnormal; Notable for the following components:   Sodium 134 (*)    Glucose, Bld 182 (*)    BUN 25 (*)    Creatinine, Ser 1.39 (*)    Albumin 3.3 (*)    GFR calc non Af Amer 52 (*)    All other components within normal limits    EKG None  Radiology Dg Foot Complete Right  Result Date: 09/30/2017 CLINICAL DATA:  62 year old diabetic with 2-3 month history of blistering of the RIGHT foot after a deep treatment procedure. Currently patient has open wounds on the foot with marked swelling. Prior RIGHT great toe amputation. EXAM: RIGHT FOOT COMPLETE - 3+ VIEW COMPARISON:  09/30/2016, 06/27/2016 and earlier. FINDINGS: Severe diffuse soft tissue swelling, especially involving the great toe stump and the second toe. Dislocation of the second MTP joint, with slight osteolysis of the head of the proximal phalanx of the second toe. Flattening  of the head of the third metatarsal with subchondral cyst formation since the prior examination 1 year  ago. Joint space narrowing involving the fourth MTP joint, also new since the examination 1 year ago. Remaining joint spaces well-preserved. Mild osseous demineralization. IMPRESSION: 1. Dislocation of the second MTP joint with possible osteomyelitis involving the base of the proximal phalanx of the second toe. 2. Flattening of the head of the third metatarsal, new since the examination 1 year ago, possibly related to osteonecrosis. 3. Developing osteoarthritis involving the fourth MTP joint. 4. Soft tissue swelling involving the right great toe stump and the second toe, associated with ulcerations. Electronically Signed   By: Evangeline Dakin M.D.   On: 09/30/2017 20:59    Procedures Procedures (including critical care time)  Medications Ordered in ED Medications  piperacillin-tazobactam (ZOSYN) IVPB 3.375 g (3.375 g Intravenous New Bag/Given 09/30/17 2147)     Initial Impression / Assessment and Plan / ED Course  I have reviewed the triage vital signs and the nursing notes.  Pertinent labs & imaging results that were available during my care of the patient were reviewed by me and considered in my medical decision making (see chart for details).  Clinical Course as of Sep 30 2149  Wed Sep 30, 2017  2114 Laboratory tests are reassuring with the exception of hyperglycemia   [JK]  2114 Chronic renal insufficiency stable.   [JK]  2114 X-ray shows a dislocation and suggest possible osteomyelitis   [JK]  2147 Discussed with Dr Doreatha Martin.  Recommends admission to [facility].     [JK]  2147 D/w Dr Myna Hidalgo.  Will arrange for admission   [JK]    Clinical Course User Index [JK] Dorie Rank, MD     presented to the emergency room for evaluation of a foot injury.  Patient fell off the roof on Saturday.  Patient was seen in the ED 2 days ago but it does not appear that his foot evaluated at that  time.  Patient noticed the worsening swelling today.  His x-ray suggest the possibility of osteomyelitis and show a dislocation of his toe.  Plan on admission to the hospital for IV antibiotics.  He will need orthopedic consultation.  Unfortunately there is no one available here at any [facility] and Dr. Doreatha Martin is covering at [facility].  Patient will require transfer down to [facility].  Final Clinical Impressions(s) / ED Diagnoses   Final diagnoses:  Other acute osteomyelitis, unspecified site [facility])  Dislocation of MTP joint of right lesser toe(s), init    ED Discharge Orders    None       Dorie Rank, MD 09/30/17 2152

## 2017-10-01 ENCOUNTER — Encounter (HOSPITAL_COMMUNITY): Payer: Self-pay | Admitting: General Practice

## 2017-10-01 DIAGNOSIS — E11621 Type 2 diabetes mellitus with foot ulcer: Secondary | ICD-10-CM

## 2017-10-01 DIAGNOSIS — E1122 Type 2 diabetes mellitus with diabetic chronic kidney disease: Secondary | ICD-10-CM

## 2017-10-01 DIAGNOSIS — E039 Hypothyroidism, unspecified: Secondary | ICD-10-CM

## 2017-10-01 DIAGNOSIS — E1142 Type 2 diabetes mellitus with diabetic polyneuropathy: Secondary | ICD-10-CM

## 2017-10-01 DIAGNOSIS — Z886 Allergy status to analgesic agent status: Secondary | ICD-10-CM

## 2017-10-01 DIAGNOSIS — K5900 Constipation, unspecified: Secondary | ICD-10-CM

## 2017-10-01 DIAGNOSIS — L089 Local infection of the skin and subcutaneous tissue, unspecified: Secondary | ICD-10-CM

## 2017-10-01 DIAGNOSIS — F331 Major depressive disorder, recurrent, moderate: Secondary | ICD-10-CM

## 2017-10-01 DIAGNOSIS — E11628 Type 2 diabetes mellitus with other skin complications: Secondary | ICD-10-CM

## 2017-10-01 DIAGNOSIS — Z87891 Personal history of nicotine dependence: Secondary | ICD-10-CM

## 2017-10-01 DIAGNOSIS — N183 Chronic kidney disease, stage 3 (moderate): Secondary | ICD-10-CM

## 2017-10-01 DIAGNOSIS — L97519 Non-pressure chronic ulcer of other part of right foot with unspecified severity: Secondary | ICD-10-CM

## 2017-10-01 DIAGNOSIS — Z9989 Dependence on other enabling machines and devices: Secondary | ICD-10-CM

## 2017-10-01 DIAGNOSIS — Z794 Long term (current) use of insulin: Secondary | ICD-10-CM

## 2017-10-01 DIAGNOSIS — G4733 Obstructive sleep apnea (adult) (pediatric): Secondary | ICD-10-CM

## 2017-10-01 DIAGNOSIS — E1169 Type 2 diabetes mellitus with other specified complication: Principal | ICD-10-CM

## 2017-10-01 DIAGNOSIS — Z86718 Personal history of other venous thrombosis and embolism: Secondary | ICD-10-CM

## 2017-10-01 DIAGNOSIS — Z888 Allergy status to other drugs, medicaments and biological substances status: Secondary | ICD-10-CM

## 2017-10-01 DIAGNOSIS — M869 Osteomyelitis, unspecified: Secondary | ICD-10-CM

## 2017-10-01 DIAGNOSIS — M8610 Other acute osteomyelitis, unspecified site: Secondary | ICD-10-CM

## 2017-10-01 DIAGNOSIS — E119 Type 2 diabetes mellitus without complications: Secondary | ICD-10-CM

## 2017-10-01 DIAGNOSIS — B20 Human immunodeficiency virus [HIV] disease: Secondary | ICD-10-CM

## 2017-10-01 DIAGNOSIS — Z89411 Acquired absence of right great toe: Secondary | ICD-10-CM

## 2017-10-01 LAB — BASIC METABOLIC PANEL
Anion gap: 8 (ref 5–15)
BUN: 22 mg/dL (ref 8–23)
CALCIUM: 8.4 mg/dL — AB (ref 8.9–10.3)
CHLORIDE: 102 mmol/L (ref 98–111)
CO2: 23 mmol/L (ref 22–32)
CREATININE: 1.34 mg/dL — AB (ref 0.61–1.24)
GFR calc Af Amer: >60 mL/min (ref >60)
GFR calc non Af Amer: 54 mL/min — ABNORMAL LOW (ref >60)
GLUCOSE: 172 mg/dL — AB (ref 70–99)
Potassium: 3.4 mmol/L — ABNORMAL LOW (ref 3.5–5.1)
Sodium: 133 mmol/L — ABNORMAL LOW (ref 135–145)

## 2017-10-01 LAB — MRSA PCR SCREENING: MRSA BY PCR: NEGATIVE

## 2017-10-01 LAB — CBC WITH DIFFERENTIAL/PLATELET
BASOS PCT: 0 %
Basophils Absolute: 0 10*3/uL (ref 0.0–0.1)
EOS ABS: 0.2 10*3/uL (ref 0.0–0.7)
Eosinophils Relative: 4 %
HEMATOCRIT: 38.6 % — AB (ref 39.0–52.0)
HEMOGLOBIN: 13.7 g/dL (ref 13.0–17.0)
LYMPHS ABS: 1.9 10*3/uL (ref 0.7–4.0)
Lymphocytes Relative: 31 %
MCH: 33.3 pg (ref 26.0–34.0)
MCHC: 35.5 g/dL (ref 30.0–36.0)
MCV: 93.7 fL (ref 78.0–100.0)
MONO ABS: 1 10*3/uL (ref 0.1–1.0)
MONOS PCT: 17 %
NEUTROS ABS: 3.1 10*3/uL (ref 1.7–7.7)
NEUTROS PCT: 48 %
Platelets: 156 10*3/uL (ref 150–400)
RBC: 4.12 MIL/uL — ABNORMAL LOW (ref 4.22–5.81)
RDW: 13.2 % (ref 11.5–15.5)
WBC: 6.3 10*3/uL (ref 4.0–10.5)

## 2017-10-01 LAB — SEDIMENTATION RATE: Sed Rate: 90 mm/hr — ABNORMAL HIGH (ref 0–16)

## 2017-10-01 LAB — MAGNESIUM: MAGNESIUM: 2 mg/dL (ref 1.7–2.4)

## 2017-10-01 LAB — HEPARIN LEVEL (UNFRACTIONATED)
HEPARIN UNFRACTIONATED: 0.33 [IU]/mL (ref 0.30–0.70)
Heparin Unfractionated: 0.42 IU/mL (ref 0.30–0.70)

## 2017-10-01 LAB — GLUCOSE, CAPILLARY
GLUCOSE-CAPILLARY: 148 mg/dL — AB (ref 70–99)
GLUCOSE-CAPILLARY: 191 mg/dL — AB (ref 70–99)
GLUCOSE-CAPILLARY: 108 mg/dL — AB (ref 70–99)
Glucose-Capillary: 125 mg/dL — ABNORMAL HIGH (ref 70–99)

## 2017-10-01 LAB — C-REACTIVE PROTEIN: CRP: 24.9 mg/dL — AB (ref <1.0)

## 2017-10-01 LAB — APTT: APTT: 30 s (ref 24–36)

## 2017-10-01 LAB — PREALBUMIN: Prealbumin: 9 mg/dL — ABNORMAL LOW (ref 18–38)

## 2017-10-01 LAB — CBG MONITORING, ED: GLUCOSE-CAPILLARY: 185 mg/dL — AB (ref 70–99)

## 2017-10-01 MED ORDER — PRO-STAT SUGAR FREE PO LIQD
30.0000 mL | Freq: Two times a day (BID) | ORAL | Status: DC
  Administered 2017-10-01: 30 mL via ORAL
  Filled 2017-10-01 (×3): qty 30

## 2017-10-01 MED ORDER — VANCOMYCIN HCL IN DEXTROSE 1-5 GM/200ML-% IV SOLN
1000.0000 mg | INTRAVENOUS | Status: AC
  Administered 2017-10-01 (×2): 1000 mg via INTRAVENOUS
  Filled 2017-10-01 (×2): qty 200

## 2017-10-01 MED ORDER — INSULIN GLARGINE 100 UNIT/ML ~~LOC~~ SOLN
150.0000 [IU] | Freq: Every day | SUBCUTANEOUS | Status: DC
  Administered 2017-10-01: 150 [IU] via SUBCUTANEOUS
  Filled 2017-10-01: qty 1.5

## 2017-10-01 MED ORDER — POTASSIUM CHLORIDE CRYS ER 20 MEQ PO TBCR
40.0000 meq | EXTENDED_RELEASE_TABLET | Freq: Once | ORAL | Status: AC
  Administered 2017-10-01: 40 meq via ORAL
  Filled 2017-10-01: qty 2

## 2017-10-01 MED ORDER — VANCOMYCIN HCL IN DEXTROSE 1-5 GM/200ML-% IV SOLN
1000.0000 mg | Freq: Two times a day (BID) | INTRAVENOUS | Status: DC
  Administered 2017-10-01 – 2017-10-02 (×2): 1000 mg via INTRAVENOUS
  Filled 2017-10-01 (×3): qty 200

## 2017-10-01 MED ORDER — SENNOSIDES-DOCUSATE SODIUM 8.6-50 MG PO TABS
1.0000 | ORAL_TABLET | Freq: Two times a day (BID) | ORAL | Status: DC
  Administered 2017-10-01 – 2017-10-05 (×8): 1 via ORAL
  Filled 2017-10-01 (×7): qty 1

## 2017-10-01 MED ORDER — AMPHETAMINE-DEXTROAMPHETAMINE 10 MG PO TABS
30.0000 mg | ORAL_TABLET | Freq: Three times a day (TID) | ORAL | Status: DC
  Administered 2017-10-01 – 2017-10-05 (×8): 30 mg via ORAL
  Filled 2017-10-01 (×10): qty 3

## 2017-10-01 MED ORDER — POLYETHYLENE GLYCOL 3350 17 G PO PACK
17.0000 g | PACK | Freq: Two times a day (BID) | ORAL | Status: DC
  Administered 2017-10-01 – 2017-10-05 (×8): 17 g via ORAL
  Filled 2017-10-01 (×7): qty 1

## 2017-10-01 MED ORDER — PROTRIPTYLINE HCL 10 MG PO TABS
10.0000 mg | ORAL_TABLET | Freq: Three times a day (TID) | ORAL | Status: DC
  Administered 2017-10-01 – 2017-10-05 (×8): 10 mg via ORAL
  Filled 2017-10-01 (×11): qty 1

## 2017-10-01 MED ORDER — INSULIN GLARGINE 100 UNIT/ML ~~LOC~~ SOLN
75.0000 [IU] | Freq: Every day | SUBCUTANEOUS | Status: DC
  Administered 2017-10-03 – 2017-10-05 (×3): 75 [IU] via SUBCUTANEOUS
  Filled 2017-10-01 (×4): qty 0.75

## 2017-10-01 MED ORDER — BISACODYL 10 MG RE SUPP: 10.0000 mg | Freq: Every day | RECTAL | Status: DC | PRN

## 2017-10-01 MED ORDER — INSULIN ASPART 100 UNIT/ML ~~LOC~~ SOLN: 20.0000 [IU] | Freq: Every day | SUBCUTANEOUS | Status: DC

## 2017-10-01 NOTE — Progress Notes (Signed)
PROGRESS NOTE    Daniel [name] [facility]  SRP:[id number] DOB: [birth year] DOA: 09/30/2017 PCP: [name], MD   Brief Narrative:  Patient 62 year old gentleman with history of HIV, last CD4 count of 590 and viral load undetectable March 2019, history of depression with anxiety, insomnia, obstructive sleep apnea, chronic kidney disease stage III, insulin-dependent diabetes mellitus, history of DVT on Xarelto, hypothyroidism presenting to the ED with increased swelling drainage of his second toe on the right foot.  Work-up done consistent with diabetic foot infection with dislocation of second MTP and possible osteomyelitis.  Patient placed empirically on IV antibiotics.  Orthopedics consulted.  ID consulted.   Assessment & Plan:   Principal Problem:   Diabetic foot infection ([place]) Active Problems:   Human immunodeficiency virus (HIV) disease (Daniel [name])   Insulin-requiring or dependent type II diabetes mellitus (Daniel [name])   Chronic renal insufficiency, stage III (moderate) (Daniel [name])   OSA on CPAP   Major depressive disorder, recurrent episode, moderate (Daniel [name])   History of DVT (deep vein thrombosis)   Osteomyelitis (Daniel [name])   Hypothyroidism   Constipation  #1 diabetic foot infection/right foot cellulitis with osteomyelitis Patient presented with increased swelling and drainage second right toe.  Plain films reveal dislocation of second MTP and concern for possible osteomyelitis.  Blood cultures have been obtained and are pending.  CRP elevated at 24.9.  Sed rate at 90.  Patient seen in consultation by orthopedic who were concerned about osteomyelitis and feel patient would likely need an amputation.  Dr. Sharol Given to evaluate patient.  Patient to be n.p.o. after midnight pending Dr. Jess Barters evaluation.  Continue empiric IV vancomycin, IV Rocephin, IV Flagyl.  ID has been consulted and are following and asking for deep culture of the right plantar foot ulcer be done if patient undergoes surgery.  2.  HIV  disease Well-controlled.  Viral load undetectable in March.  CD4 count of 590.  Continue current regimen of triumeq.  ID following.  3.  Diabetes mellitus type 2, insulin-dependent Hemoglobin A1c was 7.6 on 09/21/2017.  Patient was initially made n.p.o. last night and as such was only given half his home dose of Lantus.  Resume full dose Lantus 150 units today and placed back on half home dose tomorrow as patient is going to be n.p.o. after midnight.  We will give home regimen of NovoLog 20 units with supper today for his meal coverage.  As patient is going to be n.p.o. after midnight we will continue sliding scale and hold meal coverage insulin tomorrow.  4.  History of DVT Xarelto on hold in anticipation of possible surgery.  Continue IV heparin.  5.  Chronic kidney disease stage III Stable.  Monitor closely as patient on IV vancomycin.  6.  Hypokalemia Magnesium level at 2.0.  Replete potassium.  7.  Hypothyroidism Continue home dose Synthroid.  8.  Obstructive sleep apnea CPAP nightly.  9.  Anxiety/depression/insomnia Stable.  Continue Depakote,trintellix  10.  Constipation Noted on abdominal films.  Placed on MiraLAX twice daily and Senokot as twice daily.   DVT prophylaxis: Heparin Code Status: Full Family Communication: Updated patient.  No family at bedside. Disposition Plan: SNF versus home with home health when okay with orthopedics and ID.   Consultants:   Orthopedics: Hilbert Odor, PA 10/01/2017  Infectious disease: [name] 10/01/2017  Procedures:  CT head CT C-spine 09/28/2017  CT chest 09/28/2017  CT abdomen and pelvis 09/28/2017  Plain films of the right foot 09/30/2017  Antimicrobials:   IV Rocephin  10/01/2017  IV Flagyl 10/01/2017  IV vancomycin 09/30/2017   Subjective: Sleeping.  Easily arousable.  Denies any chest pain or shortness of breath when awake however states has obstructive sleep apnea and is on a CPAP machine and at times gets short of  breath while sleeping which is chronic.  Objective: Vitals:   10/01/17 0025 10/01/17 0333 10/01/17 0440 10/01/17 0544  BP: 122/66 120/72 128/73 138/80  Pulse: 96 83 80 86  Resp: 17 16 17 18   Temp: 98.6 F (37 C)  98.4 F (36.9 C) 98 F (36.7 C)  TempSrc: Oral  Oral Oral  SpO2: 99% 99% 99% 98%  Weight:      Height:        Intake/Output Summary (Last 24 hours) at 10/01/2017 1903 Last data filed at 10/01/2017 1700 Gross per 24 hour  Intake 1938.04 ml  Output 800 ml  Net 1138.04 ml   Filed Weights   09/30/17 1756  Weight: 115.7 kg    Examination:  General exam: Appears calm and comfortable.  Facial droop noted Respiratory system: Clear to auscultation. Respiratory effort normal. Cardiovascular system: S1 & S2 heard, RRR. No JVD, murmurs, rubs, gallops or clicks. No pedal edema. Gastrointestinal system: Abdomen is nondistended, soft and nontender. No organomegaly or masses felt. Normal bowel sounds heard. Central nervous system: Alert and oriented. No focal neurological deficits. Extremities: Right lower extremity wrapped with drainage noted in dressing.  Symmetric 5 x 5 power. Skin: No rashes, lesions or ulcers Psychiatry: Judgement and insight appear normal. Mood & affect appropriate.     Data Reviewed: I have personally reviewed following labs and imaging studies  CBC: Recent Labs  Lab 09/28/17 1344 09/28/17 1357 09/30/17 1816 10/01/17 0441  WBC 10.7*  --  8.6 6.3  NEUTROABS 8.0*  --  5.1 3.1  HGB 14.6 13.6 14.7 13.7  HCT 42.0 40.0 41.7 38.6*  MCV 96.1  --  95.0 93.7  PLT 108*  --  172 625   Basic Metabolic Panel: Recent Labs  Lab 09/28/17 1344 09/28/17 1357 09/30/17 1816 10/01/17 0441 10/01/17 1134  NA 134* 137 134* 133*  --   K 4.1 4.3 3.8 3.4*  --   CL 102 103 103 102  --   CO2 26  --  23 23  --   GLUCOSE 181* 177* 182* 172*  --   BUN 22 21 25* 22  --   CREATININE 1.45* 1.50* 1.39* 1.34*  --   CALCIUM 8.4*  --  8.9 8.4*  --   MG  --   --   --    --  2.0   GFR: Estimated Creatinine Clearance: 72.1 mL/min (A) (by C-G formula based on SCr of 1.34 mg/dL (H)). Liver Function Tests: Recent Labs  Lab 09/28/17 1344 09/30/17 1816  AST 22 30  ALT 26 32  ALKPHOS 77 83  BILITOT 1.3* 1.1  PROT 6.6 7.5  ALBUMIN 3.1* 3.3*   No results for input(s): LIPASE, AMYLASE in the last 168 hours. No results for input(s): AMMONIA in the last 168 hours. Coagulation Profile: No results for input(s): INR, PROTIME in the last 168 hours. Cardiac Enzymes: No results for input(s): CKTOTAL, CKMB, CKMBINDEX, TROPONINI in the last 168 hours. BNP (last 3 results) Recent Labs    12/11/16 1853  PROBNP 31   HbA1C: No results for input(s): HGBA1C in the last 72 hours. CBG: Recent Labs  Lab 10/01/17 0012 10/01/17 0547 10/01/17 1207 10/01/17 1717  GLUCAP 185* 148* 191*  125*   Lipid Profile: No results for input(s): CHOL, HDL, LDLCALC, TRIG, CHOLHDL, LDLDIRECT in the last 72 hours. Thyroid Function Tests: No results for input(s): TSH, T4TOTAL, FREET4, T3FREE, THYROIDAB in the last 72 hours. Anemia Panel: No results for input(s): VITAMINB12, FOLATE, FERRITIN, TIBC, IRON, RETICCTPCT in the last 72 hours. Sepsis Labs: No results for input(s): PROCALCITON, LATICACIDVEN in the last 168 hours.  Recent Results (from the past 240 hour(s))  Blood culture (routine x 2)     Status: None (Preliminary result)   Collection Time: 09/30/17  6:12 PM  Result Value Ref Range Status   Specimen Description BLOOD RIGHT ARM  Final   Special Requests   Final    BOTTLES DRAWN AEROBIC AND ANAEROBIC Blood Culture adequate volume   Culture   Final    NO GROWTH < 12 HOURS Performed at [facility], [address], [place], [place] [address]    Report Status PENDING  Incomplete  Blood culture (routine x 2)     Status: None (Preliminary result)   Collection Time: 09/30/17  9:34 PM  Result Value Ref Range Status   Specimen Description RIGHT ANTECUBITAL  Final   Special  Requests   Final    BOTTLES DRAWN AEROBIC AND ANAEROBIC Blood Culture adequate volume   Culture   Final    NO GROWTH < 12 HOURS Performed at [facility], [address] [place], [place] [address]    Report Status PENDING  Incomplete  MRSA PCR Screening     Status: None   Collection Time: 10/01/17 11:56 AM  Result Value Ref Range Status   MRSA by PCR NEGATIVE NEGATIVE Final    Comment:        The GeneXpert MRSA Assay (FDA approved for NASAL specimens only), is one component of a comprehensive MRSA colonization surveillance program. It is not intended to diagnose MRSA infection nor to guide or monitor treatment for MRSA infections. Performed at [facility], [place] [address] [place], [place] [address]          Radiology Studies: Dg Foot Complete Right  Result Date: 09/30/2017 CLINICAL DATA:  62 year old diabetic with 2-3 month history of blistering of the RIGHT foot after a deep treatment procedure. Currently patient has open wounds on the foot with marked swelling. Prior RIGHT great toe amputation. EXAM: RIGHT FOOT COMPLETE - 3+ VIEW COMPARISON:  09/30/2016, 06/27/2016 and earlier. FINDINGS: Severe diffuse soft tissue swelling, especially involving the great toe stump and the second toe. Dislocation of the second MTP joint, with slight osteolysis of the head of the proximal phalanx of the second toe. Flattening of the head of the third metatarsal with subchondral cyst formation since the prior examination 1 year ago. Joint space narrowing involving the fourth MTP joint, also new since the examination 1 year ago. Remaining joint spaces well-preserved. Mild osseous demineralization. IMPRESSION: 1. Dislocation of the second MTP joint with possible osteomyelitis involving the base of the proximal phalanx of the second toe. 2. Flattening of the head of the third metatarsal, new since the examination 1 year ago, possibly related to osteonecrosis. 3. Developing osteoarthritis involving  the fourth MTP joint. 4. Soft tissue swelling involving the right great toe stump and the second toe, associated with ulcerations. Electronically Signed   By: Evangeline Dakin M.D.   On: 09/30/2017 20:59        Scheduled Meds: . abacavir-dolutegravir-lamiVUDine  1 tablet Oral QHS  . amphetamine-dextroamphetamine  30 mg Oral TID  . Brexpiprazole  1 mg Oral QHS  . diclofenac sodium  2 g Topical BID  . divalproex  500 mg Oral QHS  . feeding supplement (PRO-STAT SUGAR FREE 64)  30 mL Oral BID  . insulin aspart  0-15 Units Subcutaneous Q4H  . insulin aspart  20 Units Subcutaneous QAC supper  . insulin glargine  150 Units Subcutaneous Daily  . levothyroxine  50 mcg Oral QHS  . polyethylene glycol  17 g Oral BID  . pregabalin  100 mg Oral TID  . protriptyline  10 mg Oral TID  . senna-docusate  1 tablet Oral BID  . vortioxetine HBr  20 mg Oral QHS  . zolpidem  10 mg Oral QHS   Continuous Infusions: . cefTRIAXone (ROCEPHIN)  IV 2 g (10/01/17 0930)  . heparin 1,600 Units/hr (10/01/17 1312)  . metronidazole 500 mg (10/01/17 1742)  . vancomycin 1,000 mg (10/01/17 1325)     LOS: 1 day    Time spent: 35 minutes    Irine Seal, MD [facility] Pager [phone]  If 7PM-7AM, please contact night-coverage [email or web address] Password [email or web address] 10/01/2017, 7:03 PM

## 2017-10-01 NOTE — Progress Notes (Signed)
RT took CPAP up to pts room and set it up for pt to place on when ready for bed. RT let pt know if they needed any help to let RT know. Will continue to monitor as needed.

## 2017-10-01 NOTE — Consult Note (Signed)
ORTHOPAEDIC CONSULTATION  REQUESTING PHYSICIAN: Eugenie Filler, MD  Chief Complaint: Cellulitis ulceration swelling purulent drainage  HPI: Daniel [name] is a 62 y.o. male who presents with diabetic insensate neuropathy with previous right great toe amputation several years ago who presents with sausage digit swelling blistering of the right second toe with ulcer beneath the second metatarsal head purulent drainage  Past Medical History:  Diagnosis Date  . ADHD (attention deficit hyperactivity disorder)   . Anxiety   . Chronic kidney disease   . Clotting disorder ([place])   . Depression   . Diabetes mellitus without complication ([place])   . Diabetes mellitus, type II ([place])   . Dizziness 03/17/2015  . GERD (gastroesophageal reflux disease)   . HIV disease ([place])   . HIV infection ([place])   . Liver disease   . OSA (obstructive sleep apnea) 07/25/2015   Uses CPAP regularly  . Peripheral vascular disease ([place])   . Ulcer    Past Surgical History:  Procedure Laterality Date  . SMALL INTESTINE SURGERY    . STOMACH SURGERY    . TOE AMPUTATION Right 08/2016   right great toe   Social History   Socioeconomic History  . Marital status: Single    Spouse name: Not on file  . Number of children: Not on file  . Years of education: Not on file  . Highest education level: Not on file  Occupational History    Comment: DISABILITY  Social Needs  . Financial resource strain: Not hard at all  . Food insecurity:    Worry: Never true    Inability: Never true  . Transportation needs:    Medical: No    Non-medical: No  Tobacco Use  . Smoking status: Former Smoker    Packs/day: 0.10    Years: 10.00    Pack years: 1.00    Types: Cigars    Last attempt to quit: 08/09/2014    Years since quitting: 3.1  . Smokeless tobacco: Never Used  Substance and Sexual Activity  . Alcohol use: No    Alcohol/week: 0.0 standard drinks  . Drug use: No  . Sexual activity: Not Currently   Partners: Male    Comment: pt. declined condoms  Lifestyle  . Physical activity:    Days per week: 0 days    Minutes per session: 0 min  . Stress: Only a little  Relationships  . Social connections:    Talks on phone: More than three times a week    Gets together: Once a week    Attends religious service: Never    Active member of club or organization: No    Attends meetings of clubs or organizations: Not on file    Relationship status: Never married  Other Topics Concern  . Not on file  Social History Narrative   Epworth Sleepiness Scale = 7 (as of 03/16/2015)   Family History  Problem Relation Age of Onset  . Depression Brother   . Throat cancer Brother        half brother, never smoker  . COPD Mother   . Diabetes Neg Hx    - negative except otherwise stated in the family history section Allergies  Allergen Reactions  . Aspirin Swelling  . Ibuprofen Swelling  . Sustiva [Efavirenz] Swelling and Rash  . Nsaids Other (See Comments)    unknwn   Prior to Admission medications   Medication Sig Start Date End Date Taking? Authorizing Provider  abacavir-dolutegravir-lamiVUDine (  TRIUMEQ) [phone] MG tablet Take 1 tablet by mouth daily. Patient taking differently: Take 1 tablet by mouth at bedtime.  06/09/17  Yes Comer, Okey Regal, MD  acetaminophen (TYLENOL) 500 MG tablet Take 1,000-1,500 mg by mouth every 8 (eight) hours as needed for moderate pain.    Yes [provider]  ALPRAZolam Duanne Moron) 1 MG tablet Take 1 mg by mouth 4 (four) times daily as needed for anxiety.    Yes [provider]  amphetamine-dextroamphetamine (ADDERALL) 30 MG tablet Take 30 mg by mouth 3 (three) times daily.   Yes [provider]  Brexpiprazole (REXULTI) 1 MG TABS Take 1 mg by mouth at bedtime.   Yes [provider]  diclofenac sodium (VOLTAREN) 1 % GEL APPLY 2 GRAMS TO EACH KNEE IN THE MORNING AND AT BEDTIME AND APPLY 1 GRAM TO EACH KNEE IN THE AFTERNOON. 05/16/17  Yes  [name], MD  divalproex (DEPAKOTE ER) 500 MG 24 hr tablet TAKE 1 TABLET BY MOUTH EVERY NIGHT AT BEDTIME 09/15/17  Yes Marcial Pacas, MD  insulin aspart (NOVOLOG FLEXPEN) 100 UNIT/ML FlexPen 10 units with breakfast, and 20 units with evening meal Patient taking differently: Inject 10-20 Units into the skin See admin instructions. 10 units with breakfast, and 20 units with evening meal 09/21/17  Yes Renato Shin, MD  Insulin Glargine (LANTUS SOLOSTAR) 100 UNIT/ML Solostar Pen Inject 150 Units into the skin every morning. 09/21/17  Yes Renato Shin, MD  levothyroxine (SYNTHROID, LEVOTHROID) 50 MCG tablet Take 1 tablet (50 mcg total) by mouth at bedtime. 07/24/15  Yes [name], MD  LYRICA 100 MG capsule TAKE 1 CAPSULE BY MOUTH THREE TIMES DAILY 09/22/17  Yes [name], MD  ondansetron (ZOFRAN) 8 MG tablet Take 8 mg by mouth every 8 (eight) hours as needed for nausea or vomiting.   Yes [provider]  protriptyline (VIVACTIL) 10 MG tablet Take 10 mg by mouth 3 (three) times daily.  01/30/16  Yes [provider]  ranitidine (ZANTAC) 150 MG tablet Take 150 mg by mouth daily as needed.    Yes [provider]  TRINTELLIX 20 MG TABS Take 20 mg by mouth at bedtime. 01/26/15  Yes [provider]  XARELTO 15 MG TABS tablet TAKE 1 TABLET BY MOUTH EVERY DAY 09/22/17  Yes [name], MD  zolpidem (AMBIEN) 10 MG tablet Take 10 mg by mouth at bedtime.    Yes [provider]  Continuous Blood Gluc Receiver (FREESTYLE LIBRE 14 DAY READER) DEVI AS DIRECTED 08/13/17   Renato Shin, MD  Continuous Blood Gluc Sensor (FREESTYLE LIBRE 14 DAY SENSOR) MISC 1 Device by Does not apply route every 14 (fourteen) days. 07/15/17   Renato Shin, MD  glucose blood (FREESTYLE PRECISION NEO TEST) test strip Used to check blood sugars twice daily. 07/28/17   Renato Shin, MD  Insulin Pen Needle (B-D ULTRAFINE III SHORT PEN) 31G X 8 MM MISC USE TO INJECT LANTUS AND NOVOLOG  DAILY 09/15/17   Renato Shin, MD  UNABLE TO FIND CPAP MACHINE with standard [employer] nasal mask with humidifier. Set at 14 cwp Patient taking differently: CPAP MACHINE with standard [employer] nasal mask with humidifier. Set at 4 cwp 04/12/13   [name], MD   Dg Foot Complete Right  Result Date: 09/30/2017 CLINICAL DATA:  62 year old diabetic with 2-3 month history of blistering of the RIGHT foot after a deep treatment procedure. Currently patient has open wounds on the foot with marked  swelling. Prior RIGHT great toe amputation. EXAM: RIGHT FOOT COMPLETE - 3+ VIEW COMPARISON:  09/30/2016, 06/27/2016 and earlier. FINDINGS: Severe diffuse soft tissue swelling, especially involving the great toe stump and the second toe. Dislocation of the second MTP joint, with slight osteolysis of the head of the proximal phalanx of the second toe. Flattening of the head of the third metatarsal with subchondral cyst formation since the prior examination 1 year ago. Joint space narrowing involving the fourth MTP joint, also new since the examination 1 year ago. Remaining joint spaces well-preserved. Mild osseous demineralization. IMPRESSION: 1. Dislocation of the second MTP joint with possible osteomyelitis involving the base of the proximal phalanx of the second toe. 2. Flattening of the head of the third metatarsal, new since the examination 1 year ago, possibly related to osteonecrosis. 3. Developing osteoarthritis involving the fourth MTP joint. 4. Soft tissue swelling involving the right great toe stump and the second toe, associated with ulcerations. Electronically Signed   By: Evangeline Dakin M.D.   On: 09/30/2017 20:59   - pertinent xrays, CT, MRI studies were reviewed and independently interpreted  Positive ROS: All other systems have been reviewed and were otherwise negative with the exception of those mentioned in the HPI and as above.  Physical Exam: General: Alert, no acute distress Psychiatric: Patient is  competent for consent with normal mood and affect Lymphatic: No axillary or cervical lymphadenopathy Cardiovascular: No pedal edema Respiratory: No cyanosis, no use of accessory musculature GI: No organomegaly, abdomen is soft and non-tender    Images:  @ENCIMAGES @  Labs:  Lab Results  Component Value Date   HGBA1C 7.6 (A) 09/21/2017   HGBA1C 8.9 (A) 07/15/2017   HGBA1C 9.3 05/15/2017   ESRSEDRATE 90 (H) 09/30/2017   ESRSEDRATE 11 07/11/2015   CRP 24.9 (H) 09/30/2017   REPTSTATUS PENDING 09/30/2017   GRAMSTAIN No WBC Seen 04/01/2016   GRAMSTAIN No Squamous Epithelial Cells Seen 04/01/2016   GRAMSTAIN No Organisms Seen 04/01/2016   CULT  09/30/2017    NO GROWTH < 12 HOURS Performed at [facility], [address], [place], [place] [address]    LABORGA NO GROWTH 2 DAYS 04/01/2016    Lab Results  Component Value Date   ALBUMIN 3.3 (L) 09/30/2017   ALBUMIN 3.1 (L) 09/28/2017   ALBUMIN 4.1 12/11/2016   PREALBUMIN 9.0 (L) 09/30/2017    Neurologic: Patient does not have protective sensation bilateral lower extremities.   MUSCULOSKELETAL:   Skin: Examination patient has cellulitis and swelling of the forefoot.  There is an ulcer beneath the second metatarsal head that probes to bone.  Patient has a strong dorsalis pedis pulse with cellulitis to the midfoot and swelling of the right lower extremity.  Review of the MRI scan does show destructive bony changes of the MTP joint of the second toe consistent with chronic osteomyelitis.  There are bony changes to the first metatarsal most likely due to the initial surgery.  Patient has a pre-albumin of 9 with severe protein caloric malnutrition.  Hemoglobin A1c is elevated consistent with poorly controlled diabetes.  Patient has a history of tobacco use.  Assessment: Assessment: Diabetic insensate neuropathy uncontrolled diabetes protein caloric malnutrition with osteomyelitis ulceration and cellulitis of the right  forefoot  Plan: Plan: Patient will need a transmetatarsal amputation for attempted foot salvage.  I will discuss this with my colleagues I am leaving out of town early in the morning.  Thank you for the consult and the opportunity to see Mr. Lacock  Meridee Score, MD [facility] [phone] 8:11 PM

## 2017-10-01 NOTE — Consult Note (Addendum)
[facility] for Infectious Disease  Total days of antibiotics 2        Day 1 ceftriaxone        Day 1 metronidazole              Reason for Consult: diabetic foot osteo    Referring Physician: Grandville Silos  Principal Problem:   Diabetic foot infection ([place]) Active Problems:   Human immunodeficiency virus (HIV) disease ([place])   Insulin-requiring or dependent type II diabetes mellitus ([place])   Chronic renal insufficiency, stage III (moderate) ([name])   OSA on CPAP   Major depressive disorder, recurrent episode, moderate ([name])   History of DVT (deep vein thrombosis)    HPI: Daniel [name] is a 62 y.o. male with well controlled hiv disease, DM c/b peripheral neuropathy and diabetic foot ulcer s/p R great toe amputation. Daniel [name] noticed over the last 2 weeks having increasing drainage from plantar ulcer went to wound care that did some manipulation of the wound and since then had swelling erythema to his right foot and ankle with impressive swelling and desquamation of his 2nd digit of right foot. Denies fever, chills, nightsweat.  Past Medical History:  Diagnosis Date  . ADHD (attention deficit hyperactivity disorder)   . Anxiety   . Chronic kidney disease   . Clotting disorder ([place])   . Depression   . Diabetes mellitus without complication ([place])   . Diabetes mellitus, type II ([place])   . Dizziness 03/17/2015  . GERD (gastroesophageal reflux disease)   . HIV disease ([place])   . HIV infection ([place])   . Liver disease   . OSA (obstructive sleep apnea) 07/25/2015   Uses CPAP regularly  . Peripheral vascular disease ([place])   . Ulcer     Allergies:  Allergies  Allergen Reactions  . Aspirin Swelling  . Ibuprofen Swelling  . Sustiva [Efavirenz] Swelling and Rash  . Nsaids Other (See Comments)    unknwn   MEDICATIONS: . abacavir-dolutegravir-lamiVUDine  1 tablet Oral QHS  . amphetamine-dextroamphetamine  30 mg Oral TID  . Brexpiprazole  1 mg Oral QHS  . diclofenac sodium  2 g  Topical BID  . divalproex  500 mg Oral QHS  . insulin aspart  0-15 Units Subcutaneous Q4H  . insulin aspart  20 Units Subcutaneous QAC supper  . insulin glargine  150 Units Subcutaneous Daily  . levothyroxine  50 mcg Oral QHS  . pregabalin  100 mg Oral TID  . protriptyline  10 mg Oral TID  . vortioxetine HBr  20 mg Oral QHS  . zolpidem  10 mg Oral QHS    Social History   Tobacco Use  . Smoking status: Former Smoker    Packs/day: 0.10    Years: 10.00    Pack years: 1.00    Types: Cigars    Last attempt to quit: 08/09/2014    Years since quitting: 3.1  . Smokeless tobacco: Never Used  Substance Use Topics  . Alcohol use: No    Alcohol/week: 0.0 standard drinks  . Drug use: No    Family History  Problem Relation Age of Onset  . Depression Brother   . Throat cancer Brother        half brother, never smoker  . COPD Mother   . Diabetes Neg Hx     Review of Systems  Constitutional: Negative for fever, chills, diaphoresis, activity change, appetite change, fatigue and unexpected weight change.  HENT: Negative for congestion, sore throat,  rhinorrhea, sneezing, trouble swallowing and sinus pressure.  Eyes: Negative for photophobia and visual disturbance.  Respiratory: Negative for cough, chest tightness, shortness of breath, wheezing and stridor.  Cardiovascular: Negative for chest pain, palpitations and leg swelling.  Gastrointestinal: Negative for nausea, vomiting, abdominal pain, diarrhea, constipation, blood in stool, abdominal distention and anal bleeding.  Genitourinary: Negative for dysuria, hematuria, flank pain and difficulty urinating.  Musculoskeletal: Negative for myalgias, back pain, joint swelling, arthralgias and gait problem.  Skin: positive wound Neurological: Negative for dizziness, tremors, weakness and light-headedness.  Hematological: Negative for adenopathy. Does not bruise/bleed easily.  Psychiatric/Behavioral: Negative for behavioral problems, confusion,  sleep disturbance, dysphoric mood, decreased concentration and agitation.     OBJECTIVE: Temp:  [98 F (36.7 C)-98.6 F (37 C)] 98 F (36.7 C) (08/08 0544) Pulse Rate:  [80-96] 86 (08/08 0544) Resp:  [16-18] 18 (08/08 0544) BP: (119-138)/(62-80) 138/80 (08/08 0544) SpO2:  [95 %-99 %] 98 % (08/08 0544) Weight:  [115.7 kg] 115.7 kg (08/07 1756) Physical Exam  Constitutional: He is oriented to person, place, and time. He appears well-developed and well-nourished. No distress.  HENT:  Mouth/Throat: Oropharynx is clear and moist. No oropharyngeal exudate.  Cardiovascular: Normal rate, regular rhythm and normal heart sounds. Exam reveals no gallop and no friction rub.  No murmur heard.  Pulmonary/Chest: Effort normal and breath sounds normal. No respiratory distress. He has no wheezes.  Abdominal: Soft. Bowel sounds are normal. He exhibits no distension. There is no tenderness.  Lymphadenopathy:  He has no cervical adenopathy.  Ext = impressively edematous, erythematous 2nd toe with desquamation, plantar aspect has soft draining ulcer. Erythema and swelling to dorsum of foot as a whole and up to the ankle. Neurological: He is alert and oriented to person, place, and time. Decreased sensation to feet Skin: Skin is warm and dry. No rash noted. No erythema.  Psychiatric: He has a normal mood and affect. His behavior is normal.     LABS: Results for orders placed or performed during the hospital encounter of 09/30/17 (from the past 48 hour(s))  Blood culture (routine x 2)     Status: None (Preliminary result)   Collection Time: 09/30/17  6:12 PM  Result Value Ref Range   Specimen Description BLOOD RIGHT ARM    Special Requests      BOTTLES DRAWN AEROBIC AND ANAEROBIC Blood Culture adequate volume   Culture      NO GROWTH < 12 HOURS Performed at [facility], [address] [place], [place] [address]    Report Status PENDING   CBC with Differential     Status: Abnormal   Collection  Time: 09/30/17  6:16 PM  Result Value Ref Range   WBC 8.6 4.0 - 10.5 K/uL   RBC 4.39 4.22 - 5.81 MIL/uL   Hemoglobin 14.7 13.0 - 17.0 g/dL   HCT 41.7 39.0 - 52.0 %   MCV 95.0 78.0 - 100.0 fL   MCH 33.5 26.0 - 34.0 pg   MCHC 35.3 30.0 - 36.0 g/dL   RDW 13.2 11.5 - 15.5 %   Platelets 172 150 - 400 K/uL   Neutrophils Relative % 60 %   Neutro Abs 5.1 1.7 - 7.7 K/uL   Lymphocytes Relative 20 %   Lymphs Abs 1.7 0.7 - 4.0 K/uL   Monocytes Relative 17 %   Monocytes Absolute 1.5 (H) 0.1 - 1.0 K/uL   Eosinophils Relative 3 %   Eosinophils Absolute 0.3 0.0 - 0.7 K/uL   Basophils  Relative 0 %   Basophils Absolute 0.0 0.0 - 0.1 K/uL    Comment: Performed at [facility], [address], [place], [place] [address]  Comprehensive metabolic panel     Status: Abnormal   Collection Time: 09/30/17  6:16 PM  Result Value Ref Range   Sodium 134 (L) 135 - 145 mmol/L   Potassium 3.8 3.5 - 5.1 mmol/L   Chloride 103 98 - 111 mmol/L   CO2 23 22 - 32 mmol/L   Glucose, Bld 182 (H) 70 - 99 mg/dL   BUN 25 (H) 8 - 23 mg/dL   Creatinine, Ser 1.39 (H) 0.61 - 1.24 mg/dL   Calcium 8.9 8.9 - 10.3 mg/dL   Total Protein 7.5 6.5 - 8.1 g/dL   Albumin 3.3 (L) 3.5 - 5.0 g/dL   AST 30 15 - 41 U/L   ALT 32 0 - 44 U/L   Alkaline Phosphatase 83 38 - 126 U/L   Total Bilirubin 1.1 0.3 - 1.2 mg/dL   GFR calc non Af Amer 52 (L) >60 mL/min   GFR calc Af Amer >60 >60 mL/min    Comment: (NOTE) The eGFR has been calculated using the CKD EPI equation. This calculation has not been validated in all clinical situations. eGFR's persistently <60 mL/min signify possible Chronic Kidney Disease.    Anion gap 8 5 - 15    Comment: Performed at [facility], [address] [place], [place] [address]  Sedimentation rate     Status: Abnormal   Collection Time: 09/30/17  6:16 PM  Result Value Ref Range   Sed Rate 90 (H) 0 - 16 mm/hr    Comment: Performed at [facility], [address] [place], [place] [address]  Blood culture  (routine x 2)     Status: None (Preliminary result)   Collection Time: 09/30/17  9:34 PM  Result Value Ref Range   Specimen Description RIGHT ANTECUBITAL    Special Requests      BOTTLES DRAWN AEROBIC AND ANAEROBIC Blood Culture adequate volume   Culture      NO GROWTH < 12 HOURS Performed at [facility], [address], [place], [place] [address]    Report Status PENDING   C-reactive protein     Status: Abnormal   Collection Time: 09/30/17  9:34 PM  Result Value Ref Range   CRP 24.9 (H) <1.0 mg/dL    Comment: Performed at [facility], [place] [address] [place], [place] [address]  Prealbumin     Status: Abnormal   Collection Time: 09/30/17  9:34 PM  Result Value Ref Range   Prealbumin 9.0 (L) 18 - 38 mg/dL    Comment: Performed at [facility] [address] [place], [place] [address]  Heparin level (unfractionated)     Status: None   Collection Time: 09/30/17 11:17 PM  Result Value Ref Range   Heparin Unfractionated 0.42 0.30 - 0.70 IU/mL    Comment: (NOTE) If heparin results are below expected values, and patient dosage has  been confirmed, suggest follow up testing of antithrombin III levels. Performed at [facility], [address] [place], [place] [address]   APTT     Status: None   Collection Time: 09/30/17 11:17 PM  Result Value Ref Range   aPTT 30 24 - 36 seconds    Comment: Performed at [facility] [address] [place], [place] [address]  CBG monitoring, ED     Status: Abnormal   Collection  Time: 10/01/17 12:12 AM  Result Value Ref Range   Glucose-Capillary 185 (H) 70 - 99 mg/dL  Basic metabolic panel     Status: Abnormal   Collection Time: 10/01/17  4:41 AM  Result Value Ref Range   Sodium 133 (L) 135 - 145 mmol/L   Potassium 3.4 (L) 3.5 - 5.1 mmol/L   Chloride 102 98 - 111 mmol/L   CO2 23 22 - 32 mmol/L   Glucose, Bld 172 (H) 70 - 99 mg/dL   BUN 22 8 - 23 mg/dL   Creatinine, Ser 1.34 (H) 0.61 - 1.24 mg/dL   Calcium 8.4 (L) 8.9 - 10.3 mg/dL    GFR calc non Af Amer 54 (L) >60 mL/min   GFR calc Af Amer >60 >60 mL/min    Comment: (NOTE) The eGFR has been calculated using the CKD EPI equation. This calculation has not been validated in all clinical situations. eGFR's persistently <60 mL/min signify possible Chronic Kidney Disease.    Anion gap 8 5 - 15    Comment: Performed at [facility], [address], [place], [place] [address]  CBC WITH DIFFERENTIAL     Status: Abnormal   Collection Time: 10/01/17  4:41 AM  Result Value Ref Range   WBC 6.3 4.0 - 10.5 K/uL   RBC 4.12 (L) 4.22 - 5.81 MIL/uL   Hemoglobin 13.7 13.0 - 17.0 g/dL   HCT 38.6 (L) 39.0 - 52.0 %   MCV 93.7 78.0 - 100.0 fL   MCH 33.3 26.0 - 34.0 pg   MCHC 35.5 30.0 - 36.0 g/dL   RDW 13.2 11.5 - 15.5 %   Platelets 156 150 - 400 K/uL   Neutrophils Relative % 48 %   Neutro Abs 3.1 1.7 - 7.7 K/uL   Lymphocytes Relative 31 %   Lymphs Abs 1.9 0.7 - 4.0 K/uL   Monocytes Relative 17 %   Monocytes Absolute 1.0 0.1 - 1.0 K/uL   Eosinophils Relative 4 %   Eosinophils Absolute 0.2 0.0 - 0.7 K/uL   Basophils Relative 0 %   Basophils Absolute 0.0 0.0 - 0.1 K/uL    Comment: Performed at [facility], [address], [place], [place] [address]  Heparin level (unfractionated)     Status: None   Collection Time: 10/01/17  4:41 AM  Result Value Ref Range   Heparin Unfractionated 0.33 0.30 - 0.70 IU/mL    Comment: (NOTE) If heparin results are below expected values, and patient dosage has  been confirmed, suggest follow up testing of antithrombin III levels. Performed at [facility], [address] [place], [place] [address]   Glucose, capillary     Status: Abnormal   Collection Time: 10/01/17  5:47 AM  Result Value Ref Range   Glucose-Capillary 148 (H) 70 - 99 mg/dL  Magnesium     Status: None   Collection Time: 10/01/17 11:34 AM  Result Value Ref Range   Magnesium 2.0 1.7 - 2.4 mg/dL    Comment: Performed at [facility] [address] [place],   [address]  MRSA PCR Screening     Status: None   Collection Time: 10/01/17 11:56 AM  Result Value Ref Range   MRSA by PCR NEGATIVE NEGATIVE    Comment:        The GeneXpert MRSA Assay (FDA approved for NASAL specimens only), is one component of a comprehensive MRSA colonization surveillance program. It is not intended to diagnose MRSA infection nor to guide or monitor  treatment for MRSA infections. Performed at [facility], [place] [address], [place], [place] [address]   Glucose, capillary     Status: Abnormal   Collection Time: 10/01/17 12:07 PM  Result Value Ref Range   Glucose-Capillary 191 (H) 70 - 99 mg/dL   Lab Results  Component Value Date   ESRSEDRATE 90 (H) 09/30/2017    MICRO: 8/7 blood cx IMAGING: Dg Foot Complete Right  Result Date: 09/30/2017 CLINICAL DATA:  62 year old diabetic with 2-3 month history of blistering of the RIGHT foot after a deep treatment procedure. Currently patient has open wounds on the foot with marked swelling. Prior RIGHT great toe amputation. EXAM: RIGHT FOOT COMPLETE - 3+ VIEW COMPARISON:  09/30/2016, 06/27/2016 and earlier. FINDINGS: Severe diffuse soft tissue swelling, especially involving the great toe stump and the second toe. Dislocation of the second MTP joint, with slight osteolysis of the head of the proximal phalanx of the second toe. Flattening of the head of the third metatarsal with subchondral cyst formation since the prior examination 1 year ago. Joint space narrowing involving the fourth MTP joint, also new since the examination 1 year ago. Remaining joint spaces well-preserved. Mild osseous demineralization. IMPRESSION: 1. Dislocation of the second MTP joint with possible osteomyelitis involving the base of the proximal phalanx of the second toe. 2. Flattening of the head of the third metatarsal, new since the examination 1 year ago, possibly related to osteonecrosis. 3. Developing osteoarthritis involving the fourth MTP joint. 4.  Soft tissue swelling involving the right great toe stump and the second toe, associated with ulcerations. Electronically Signed   By: Evangeline Dakin M.D.   On: 09/30/2017 20:59    Assessment/Plan:  62yo M with HIV disease, well controlled, but likely poorly controlled DM with DFU/osteomyelitis with cellulitis,draining wound and likely 2nd toe MTP osteomyelitis.  - recommend surgical debridement either 2nd ray amputation vs. TM amputation for source control - will continue on ceftriaxone, metronidazole and vancomycin - please do deep culture of right plantar foot ulcer if he is not going to surgery soon since need assistance with narrowing abtx. I suspect MSSA given quick onset after manipulation - may need vascular studies to see if adequate blood flow for healing.  - length of therapy will depend on how much debridement is done for the patient  Hx of ckd 3 = will adjust vancomycin accordingly to renal function  hiv disease= well controlled in march. Will continue with triumeq  DM = recommend to check hemoglobin a 1c to see if medications need to be adjusted.

## 2017-10-01 NOTE — Progress Notes (Signed)
[place] for heparin Indication: DVT  Allergies  Allergen Reactions  . Aspirin Swelling  . Ibuprofen Swelling  . Sustiva [Efavirenz] Swelling and Rash  . Nsaids Other (See Comments)    unknwn   Patient Measurements: Height: 5' 11"  (180.3 cm) Weight: 255 lb (115.7 kg) IBW/kg (Calculated) : 75.3 Heparin Dosing Weight: 100 kg  Vital Signs: Temp: 98 F (36.7 C) (08/08 0544) Temp Source: Oral (08/08 0544) BP: 138/80 (08/08 0544) Pulse Rate: 86 (08/08 0544)  Labs: Recent Labs    09/28/17 1344 09/28/17 1357 09/30/17 1816 09/30/17 2317 10/01/17 0441  HGB 14.6 13.6 14.7  --  13.7  HCT 42.0 40.0 41.7  --  38.6*  PLT 108*  --  172  --  156  APTT  --   --   --  30  --   HEPARINUNFRC  --   --   --  0.42 0.33  CREATININE 1.45* 1.50* 1.39*  --  1.34*   Estimated Creatinine Clearance: 72.1 mL/min (A) (by C-G formula based on SCr of 1.34 mg/dL (H)).  Medical History: Past Medical History:  Diagnosis Date  . ADHD (attention deficit hyperactivity disorder)   . Anxiety   . Chronic kidney disease   . Clotting disorder ([place])   . Depression   . Diabetes mellitus without complication ([place])   . Diabetes mellitus, type II ([place])   . Dizziness 03/17/2015  . GERD (gastroesophageal reflux disease)   . HIV disease ([place])   . HIV infection ([place])   . Liver disease   . OSA (obstructive sleep apnea) 07/25/2015   Uses CPAP regularly  . Peripheral vascular disease ([place])   . Ulcer    Assessment: 56 yom with increased swelling of 2nd right toe. Holding Xarelto and starting heparin infusion in anticipation of possible surgery tomorrow.    HL = 0.33 and at the lower end of range.  No noted bleeding complications.  Goal of Therapy:  Heparin level 0.3-0.7 units/ml Monitor platelets by anticoagulation protocol: Yes   Plan:  Continue IV heparin infusion at 1600 units/hr Check anti-Xa level in daily while on heparin Continue to monitor H&H and  platelets  Rober Minion, PharmD., MS Clinical Pharmacist Pager:  [phone] Thank you for allowing pharmacy to be part of this patients care team. 10/01/2017,7:51 AM

## 2017-10-01 NOTE — Progress Notes (Signed)
Dr. Sharol Given in to see patient and discussed plan of care with patient.

## 2017-10-01 NOTE — Consult Note (Signed)
[place] Nurse wound consult note Reason for Consult:  Right cellulitis and osteomyelitis.  Dr Sharol Given to evaluate and surgical intervention is planned.  No further WOC needs at this time.   Will not follow at this time.  Please re-consult if needed.  Domenic Moras RN BSN [place] Pager [phone]

## 2017-10-01 NOTE — Social Work (Addendum)
CSW acknowledging consult for "access meds for discharge."  For medication assistance please consult RN Case Management.  CSW signing off. Please consult if any additional needs arise.  [place], [place] Work [phone]

## 2017-10-01 NOTE — Progress Notes (Addendum)
Initial Nutrition Assessment  DOCUMENTATION CODES:   Obesity unspecified  INTERVENTION:     Prostat liquid protein po 30 ml BID with meals, each supplement provides 100 kcal, 15 grams protein  NUTRITION DIAGNOSIS:   Increased nutrient needs related to wound healing as evidenced by estimated needs  GOAL:   Patient will meet greater than or equal to 90% of their needs  MONITOR:   PO intake, Supplement acceptance, Labs, Skin, Weight trends, I & O's  REASON FOR ASSESSMENT:   Consult Wound healing  ASSESSMENT:    62 y.o. Male with PMH significant for HIV, CKD, insulin-dependent diabetes mellitus, and OSA, presented to ED for evaluation of increasing swelling and drainage from his second toe on the right foot.  Pt admitted with R foot cellulitis with acute osteomyelitis. He is sleeping soundly upon RD visit. Snoring. Did not wake. Labs and medications reviewed. Na 133 (L). K 3.4 (L).  Pt to be NPO after MN for surgical intervention. RD to add however Prostat liquid protein. CBG's [phone].  NUTRITION - FOCUSED PHYSICAL EXAM:  Unable to complete at this time.  Diet Order:   Diet Order            Diet NPO time specified Except for: Sips with Meds  Diet effective midnight        Diet Carb Modified Fluid consistency: Thin; Room service appropriate? Yes  Diet effective now             EDUCATION NEEDS:   Not appropriate for education at this time  Skin:  Skin Assessment: Skin Integrity Issues: Skin Integrity Issues:: Other (Comment) Other: R foot cellulitis  Last BM:  8/8  Height:   Ht Readings from Last 1 Encounters:  09/30/17 5' 11"  (1.803 m)   Weight:   Wt Readings from Last 1 Encounters:  09/30/17 115.7 kg   BMI:  Body mass index is 35.57 kg/m.  Estimated Nutritional Needs:   Kcal:  2000-2200  Protein:  105-120 gm  Fluid:  2.0-2.2 L  Arthur Holms, RD, LDN Pager #: [phone] After-Hours Pager #: [phone]

## 2017-10-01 NOTE — Progress Notes (Signed)
ANTIBIOTIC CONSULT NOTE   Pharmacy Consult for vancomycin Indication: diabetic foot infection  Allergies  Allergen Reactions  . Aspirin Swelling  . Ibuprofen Swelling  . Sustiva [Efavirenz] Swelling and Rash  . Nsaids Other (See Comments)    unknwn   Patient Measurements: Height: 5' 11"  (180.3 cm) Weight: 255 lb (115.7 kg) IBW/kg (Calculated) : 75.3 Adjusted Body Weight:   Vital Signs: Temp: 98 F (Daniel.7 C) (08/08 0544) Temp Source: Oral (08/08 0544) BP: 138/80 (08/08 0544) Pulse Rate: 86 (08/08 0544) Intake/Output from previous day: 08/07 0701 - 08/08 0700 In: 480 [IV Piggyback:480] Out: -  Intake/Output from this shift: Total I/O In: 0  Out: 400 [Urine:400]  Labs: Recent Labs    09/28/17 1344 09/28/17 1357 09/30/17 1816 10/01/17 0441  WBC 10.7*  --  8.6 6.3  HGB 14.6 13.6 14.7 13.7  PLT 108*  --  172 156  CREATININE 1.45* 1.50* 1.39* 1.34*   Estimated Creatinine Clearance: 72.1 mL/min (A) (by C-G formula based on SCr of 1.34 mg/dL (H)).  Microbiology: Recent Results (from the past 720 hour(s))  Blood culture (routine x 2)     Status: None (Preliminary result)   Collection Time: 09/30/17  6:12 PM  Result Value Ref Range Status   Specimen Description BLOOD RIGHT ARM  Final   Special Requests   Final    BOTTLES DRAWN AEROBIC AND ANAEROBIC Blood Culture adequate volume   Culture   Final    NO GROWTH < 12 HOURS Performed at [facility], [address], [place], [place] [address]    Report Status PENDING  Incomplete  Blood culture (routine x 2)     Status: None (Preliminary result)   Collection Time: 09/30/17  9:34 PM  Result Value Ref Range Status   Specimen Description RIGHT ANTECUBITAL  Final   Special Requests   Final    BOTTLES DRAWN AEROBIC AND ANAEROBIC Blood Culture adequate volume   Culture   Final    NO GROWTH < 12 HOURS Performed at [facility], [address] [place],  [address]    Report Status PENDING  Incomplete   Medical  History: Past Medical History:  Diagnosis Date  . ADHD (attention deficit hyperactivity disorder)   . Anxiety   . Chronic kidney disease   . Clotting disorder ([place])   . Depression   . Diabetes mellitus without complication ([place])   . Diabetes mellitus, type II ([place])   . Dizziness 03/17/2015  . GERD (gastroesophageal reflux disease)   . HIV disease ([place])   . HIV infection ([place])   . Liver disease   . OSA (obstructive sleep apnea) 07/25/2015   Uses CPAP regularly  . Peripheral vascular disease ([place])   . Ulcer    Assessment: Daniel [name] with diabetic foot infection - osteomyelitis and will likely need amputation.  He received one dose of IV Vancomycin around 02:00AM today.  He has a history of MRSA.  His creatinine is 1.34 with an estimated clearance of 24m/min.  He is obese and we will use an adjusted weight for his maintenance dose.  Allergies reviewed and no known antibiotic allergies.  Plan:  - Begin IV maintenance dose of Vancomycin 1gm every 12 [facility] - Monitor renal function, cultures and ongoing plans for IV antibiotics.  NRober Minion PharmD., MS Clinical Pharmacist Pager:  [phone] you for allowing pharmacy to be part of this patients care team.  10/01/2017,11:57 AM

## 2017-10-01 NOTE — Progress Notes (Signed)
Dressing change to right foot using Xeroform and Kerlix performed by Pearson Grippe, RN.  Patient tolerated dressing change without any complaints.

## 2017-10-01 NOTE — Consult Note (Signed)
Reason for Consult:Toe swelling Referring Physician: D Jenner Daniel [name] is an 62 y.o. male.  HPI: Daniel [name] is a diabetic with a hx/o foot ulcerations. He underwent amputation of his great toe 2y ago at [facility]. He noted 2nd toe swelling about 3d ago. Denies associated pain. Denies fevers, chills, sweats, N/V.   Past Medical History:  Diagnosis Date  . ADHD (attention deficit hyperactivity disorder)   . Anxiety   . Chronic kidney disease   . Clotting disorder ([name])   . Depression   . Diabetes mellitus without complication (Daniel [name])   . Diabetes mellitus, type II (Daniel [name])   . Dizziness 03/17/2015  . GERD (gastroesophageal reflux disease)   . HIV disease (Daniel [name])   . HIV infection (Daniel [name])   . Liver disease   . OSA (obstructive sleep apnea) 07/25/2015   Uses CPAP regularly  . Peripheral vascular disease (Daniel [name])   . Ulcer     Past Surgical History:  Procedure Laterality Date  . SMALL INTESTINE SURGERY    . STOMACH SURGERY    . TOE AMPUTATION Right 08/2016   right great toe    Family History  Problem Relation Age of Onset  . Depression Brother   . Throat cancer Brother        half brother, never smoker  . COPD Mother   . Diabetes Neg Hx     Social History:  reports that he quit smoking about 3 years ago. His smoking use included cigars. He has a 1.00 pack-year smoking history. He has never used smokeless tobacco. He reports that he does not drink alcohol or use drugs.  Allergies:  Allergies  Allergen Reactions  . Aspirin Swelling  . Ibuprofen Swelling  . Sustiva [Efavirenz] Swelling and Rash  . Nsaids Other (See Comments)    unknwn    Medications: I have reviewed the patient's current medications.  Results for orders placed or performed during the hospital encounter of 09/30/17 (from the past 48 hour(s))  Blood culture (routine x 2)     Status: None (Preliminary result)   Collection Time: 09/30/17  6:12 PM  Result Value Ref Range   Specimen Description BLOOD RIGHT ARM    Special Requests      BOTTLES DRAWN AEROBIC AND ANAEROBIC Blood Culture adequate volume   Culture      NO GROWTH < 12 HOURS Performed at [facility], [address] [place], [place] [address]    Report Status PENDING   CBC with Differential     Status: Abnormal   Collection Time: 09/30/17  6:16 PM  Result Value Ref Range   WBC 8.6 4.0 - 10.5 K/uL   RBC 4.39 4.22 - 5.81 MIL/uL   Hemoglobin 14.7 13.0 - 17.0 g/dL   HCT 41.7 39.0 - 52.0 %   MCV 95.0 78.0 - 100.0 fL   MCH 33.5 26.0 - 34.0 pg   MCHC 35.3 30.0 - 36.0 g/dL   RDW 13.2 11.5 - 15.5 %   Platelets 172 150 - 400 K/uL   Neutrophils Relative % 60 %   Neutro Abs 5.1 1.7 - 7.7 K/uL   Lymphocytes Relative 20 %   Lymphs Abs 1.7 0.7 - 4.0 K/uL   Monocytes Relative 17 %   Monocytes Absolute 1.5 (H) 0.1 - 1.0 K/uL   Eosinophils Relative 3 %   Eosinophils Absolute 0.3 0.0 - 0.7 K/uL   Basophils Relative 0 %   Basophils Absolute 0.0 0.0 - 0.1 K/uL    Comment:  Performed at [facility], [address], [place], [place] [address]  Comprehensive metabolic panel     Status: Abnormal   Collection Time: 09/30/17  6:16 PM  Result Value Ref Range   Sodium 134 (L) 135 - 145 mmol/L   Potassium 3.8 3.5 - 5.1 mmol/L   Chloride 103 98 - 111 mmol/L   CO2 23 22 - 32 mmol/L   Glucose, Bld 182 (H) 70 - 99 mg/dL   BUN 25 (H) 8 - 23 mg/dL   Creatinine, Ser 1.39 (H) 0.61 - 1.24 mg/dL   Calcium 8.9 8.9 - 10.3 mg/dL   Total Protein 7.5 6.5 - 8.1 g/dL   Albumin 3.3 (L) 3.5 - 5.0 g/dL   AST 30 15 - 41 U/L   ALT 32 0 - 44 U/L   Alkaline Phosphatase 83 38 - 126 U/L   Total Bilirubin 1.1 0.3 - 1.2 mg/dL   GFR calc non Af Amer 52 (L) >60 mL/min   GFR calc Af Amer >60 >60 mL/min    Comment: (NOTE) The eGFR has been calculated using the CKD EPI equation. This calculation has not been validated in all clinical situations. eGFR's persistently <60 mL/min signify possible Chronic Kidney Disease.    Anion gap 8 5 - 15    Comment: Performed at [facility], [address], [name], [place] [address]  Sedimentation rate     Status: Abnormal   Collection Time: 09/30/17  6:16 PM  Result Value Ref Range   Sed Rate 90 (H) 0 - 16 mm/hr    Comment: Performed at [facility], [address], [place], [place] [address]  Blood culture (routine x 2)     Status: None (Preliminary result)   Collection Time: 09/30/17  9:34 PM  Result Value Ref Range   Specimen Description RIGHT ANTECUBITAL    Special Requests      BOTTLES DRAWN AEROBIC AND ANAEROBIC Blood Culture adequate volume   Culture      NO GROWTH < 12 HOURS Performed at [facility], [address] [place], [place] [address]    Report Status PENDING   Heparin level (unfractionated)     Status: None   Collection Time: 09/30/17 11:17 PM  Result Value Ref Range   Heparin Unfractionated 0.42 0.30 - 0.70 IU/mL    Comment: (NOTE) If heparin results are below expected values, and patient dosage has  been confirmed, suggest follow up testing of antithrombin III levels. Performed at [facility], [address], [place], [place] [address]   APTT     Status: None   Collection Time: 09/30/17 11:17 PM  Result Value Ref Range   aPTT 30 24 - 36 seconds    Comment: Performed at [facility], [address] [place], [place] [address]  CBG monitoring, ED     Status: Abnormal   Collection Time: 10/01/17 12:12 AM  Result Value Ref Range   Glucose-Capillary 185 (H) 70 - 99 mg/dL  Basic metabolic panel     Status: Abnormal   Collection Time: 10/01/17  4:41 AM  Result Value Ref Range   Sodium 133 (L) 135 - 145 mmol/L   Potassium 3.4 (L) 3.5 - 5.1 mmol/L   Chloride 102 98 - 111 mmol/L   CO2 23 22 - 32 mmol/L   Glucose, Bld 172 (H) 70 - 99 mg/dL   BUN 22 8 - 23 mg/dL   Creatinine, Ser 1.34 (H) 0.61 - 1.24 mg/dL   Calcium 8.4 (L) 8.9 -  10.3 mg/dL   GFR calc non Af Amer 54 (L) >60 mL/min   GFR calc Af Amer >60 >60 mL/min    Comment: (NOTE) The eGFR has been calculated using the CKD EPI equation. This  calculation has not been validated in all clinical situations. eGFR's persistently <60 mL/min signify possible Chronic Kidney Disease.    Anion gap 8 5 - 15    Comment: Performed at [facility], [address], [name], [place] [address]  CBC WITH DIFFERENTIAL     Status: Abnormal   Collection Time: 10/01/17  4:41 AM  Result Value Ref Range   WBC 6.3 4.0 - 10.5 K/uL   RBC 4.12 (L) 4.22 - 5.81 MIL/uL   Hemoglobin 13.7 13.0 - 17.0 g/dL   HCT 38.6 (L) 39.0 - 52.0 %   MCV 93.7 78.0 - 100.0 fL   MCH 33.3 26.0 - 34.0 pg   MCHC 35.5 30.0 - 36.0 g/dL   RDW 13.2 11.5 - 15.5 %   Platelets 156 150 - 400 K/uL   Neutrophils Relative % 48 %   Neutro Abs 3.1 1.7 - 7.7 K/uL   Lymphocytes Relative 31 %   Lymphs Abs 1.9 0.7 - 4.0 K/uL   Monocytes Relative 17 %   Monocytes Absolute 1.0 0.1 - 1.0 K/uL   Eosinophils Relative 4 %   Eosinophils Absolute 0.2 0.0 - 0.7 K/uL   Basophils Relative 0 %   Basophils Absolute 0.0 0.0 - 0.1 K/uL    Comment: Performed at [facility], [address], [place], [place] [address]  Heparin level (unfractionated)     Status: None   Collection Time: 10/01/17  4:41 AM  Result Value Ref Range   Heparin Unfractionated 0.33 0.30 - 0.70 IU/mL    Comment: (NOTE) If heparin results are below expected values, and patient dosage has  been confirmed, suggest follow up testing of antithrombin III levels. Performed at [facility], [address] [place], [place] [address]   Glucose, capillary     Status: Abnormal   Collection Time: 10/01/17  5:47 AM  Result Value Ref Range   Glucose-Capillary 148 (H) 70 - 99 mg/dL    Dg Foot Complete Right  Result Date: 09/30/2017 CLINICAL DATA:  61 year old diabetic with 2-3 month history of blistering of the RIGHT foot after a deep treatment procedure. Currently patient has open wounds on the foot with marked swelling. Prior RIGHT great toe amputation. EXAM: RIGHT FOOT COMPLETE - 3+ VIEW COMPARISON:  09/30/2016, 06/27/2016 and earlier.  FINDINGS: Severe diffuse soft tissue swelling, especially involving the great toe stump and the second toe. Dislocation of the second MTP joint, with slight osteolysis of the head of the proximal phalanx of the second toe. Flattening of the head of the third metatarsal with subchondral cyst formation since the prior examination 1 year ago. Joint space narrowing involving the fourth MTP joint, also new since the examination 1 year ago. Remaining joint spaces well-preserved. Mild osseous demineralization. IMPRESSION: 1. Dislocation of the second MTP joint with possible osteomyelitis involving the base of the proximal phalanx of the second toe. 2. Flattening of the head of the third metatarsal, new since the examination 1 year ago, possibly related to osteonecrosis. 3. Developing osteoarthritis involving the fourth MTP joint. 4. Soft tissue swelling involving the right great toe stump and the second toe, associated with ulcerations. Electronically Signed   By: Evangeline Dakin M.D.   On: 09/30/2017 20:59    Review of Systems  Constitutional: Negative  for chills, fever and weight loss.  HENT: Negative for ear discharge, ear pain, hearing loss and tinnitus.   Eyes: Negative for blurred vision, double vision, photophobia and pain.  Respiratory: Negative for cough, sputum production and shortness of breath.   Cardiovascular: Negative for chest pain.  Gastrointestinal: Negative for abdominal pain, nausea and vomiting.  Genitourinary: Negative for dysuria, flank pain, frequency and urgency.  Musculoskeletal: Negative for back pain, falls, joint pain, myalgias and neck pain.  Neurological: Negative for dizziness, tingling, sensory change, focal weakness, loss of consciousness and headaches.  Endo/Heme/Allergies: Does not bruise/bleed easily.  Psychiatric/Behavioral: Negative for depression, memory loss and substance abuse. The patient is not nervous/anxious.    Blood pressure 138/80, pulse 86, temperature 98  F (36.7 C), temperature source Oral, resp. rate 18, height 5' 11"  (1.803 m), weight 115.7 kg, SpO2 98 %. Physical Exam  Constitutional: He appears well-developed and well-nourished. No distress.  HENT:  Head: Normocephalic and atraumatic.  Eyes: Conjunctivae are normal. Right eye exhibits no discharge. Left eye exhibits no discharge. No scleral icterus.  Neck: Normal range of motion.  Cardiovascular: Normal rate and regular rhythm.  Respiratory: Effort normal. No respiratory distress.  Musculoskeletal:  LLE No traumatic wounds, ecchymosis, or rash  2nd toe fusiform edema, erythematous extending to distal forefoot, 3rd toe. Great toe surgically absent.  No knee or ankle effusion  Knee stable to varus/ valgus and anterior/posterior stress  Sens DPN, SPN, TN paresthetic  Motor EHL, ext, flex, evers 5/5  DP 2+, PT 1+, 1+ NP edema  Neurological: He is alert.  Skin: Skin is warm and dry. He is not diaphoretic.  Psychiatric: He has a normal mood and affect. His behavior is normal.      Assessment/Plan: Right foot cellulitis with osteomyelitis -- Will need amputation. Dr. Sharol Given to evaluate. Can eat today but will make NPO after MN. Continue abx.    Lisette Abu, PA-C Orthopedic Surgery [phone] 10/01/2017, 9:26 AM

## 2017-10-01 NOTE — Progress Notes (Signed)
[facility] Infusion Coordinator will follow pt with ID team to support home infusion pharmacy services for home IVvABX as ordered.  If patient discharges after hours, please call [phone].   [name] 10/01/2017, 10:52 PM

## 2017-10-02 ENCOUNTER — Inpatient Hospital Stay (HOSPITAL_COMMUNITY): Payer: PPO | Admitting: Certified Registered Nurse Anesthetist

## 2017-10-02 ENCOUNTER — Encounter (HOSPITAL_COMMUNITY)
Admission: EM | Disposition: A | Discharge status: Home Health | Payer: Self-pay | Source: Home / Self Care | Attending: Internal Medicine

## 2017-10-02 ENCOUNTER — Encounter (HOSPITAL_COMMUNITY): Payer: Self-pay | Admitting: *Deleted

## 2017-10-02 DIAGNOSIS — M86171 Other acute osteomyelitis, right ankle and foot: Secondary | ICD-10-CM

## 2017-10-02 HISTORY — PX: AMPUTATION: SHX166

## 2017-10-02 LAB — CBC
HCT: 39.7 % (ref 39.0–52.0)
HEMOGLOBIN: 13.8 g/dL (ref 13.0–17.0)
MCH: 32.9 pg (ref 26.0–34.0)
MCHC: 34.8 g/dL (ref 30.0–36.0)
MCV: 94.7 fL (ref 78.0–100.0)
PLATELETS: 158 10*3/uL (ref 150–400)
RBC: 4.19 MIL/uL — AB (ref 4.22–5.81)
RDW: 12.7 % (ref 11.5–15.5)
WBC: 5.7 10*3/uL (ref 4.0–10.5)

## 2017-10-02 LAB — BASIC METABOLIC PANEL
Anion gap: 11 (ref 5–15)
BUN: 21 mg/dL (ref 8–23)
CALCIUM: 8.8 mg/dL — AB (ref 8.9–10.3)
CO2: 24 mmol/L (ref 22–32)
Chloride: 101 mmol/L (ref 98–111)
Creatinine, Ser: 1.48 mg/dL — ABNORMAL HIGH (ref 0.61–1.24)
GFR calc Af Amer: 56 mL/min — ABNORMAL LOW (ref >60)
GFR, EST NON AFRICAN AMERICAN: 48 mL/min — AB (ref >60)
GLUCOSE: 120 mg/dL — AB (ref 70–99)
POTASSIUM: 3.6 mmol/L (ref 3.5–5.1)
SODIUM: 136 mmol/L (ref 135–145)

## 2017-10-02 LAB — HEPARIN LEVEL (UNFRACTIONATED): Heparin Unfractionated: 0.19 IU/mL — ABNORMAL LOW (ref 0.30–0.70)

## 2017-10-02 LAB — GLUCOSE, CAPILLARY
GLUCOSE-CAPILLARY: 172 mg/dL — AB (ref 70–99)
Glucose-Capillary: 114 mg/dL — ABNORMAL HIGH (ref 70–99)
Glucose-Capillary: 115 mg/dL — ABNORMAL HIGH (ref 70–99)
Glucose-Capillary: 75 mg/dL (ref 70–99)
Glucose-Capillary: 80 mg/dL (ref 70–99)

## 2017-10-02 SURGERY — AMPUTATION, FOOT, RAY
Anesthesia: General | Laterality: Right

## 2017-10-02 MED ORDER — MIDAZOLAM HCL 2 MG/2ML IJ SOLN
INTRAMUSCULAR | Status: AC
  Filled 2017-10-02: qty 2

## 2017-10-02 MED ORDER — CEFAZOLIN SODIUM 1 G IJ SOLR
INTRAMUSCULAR | Status: AC
  Filled 2017-10-02: qty 20

## 2017-10-02 MED ORDER — DEXTROSE-NACL 5-0.9 % IV SOLN
INTRAVENOUS | Status: DC
  Administered 2017-10-02: 13:00:00 via INTRAVENOUS

## 2017-10-02 MED ORDER — LACTATED RINGERS IV SOLN
INTRAVENOUS | Status: DC | PRN
  Administered 2017-10-02: 15:00:00 via INTRAVENOUS

## 2017-10-02 MED ORDER — LIDOCAINE 2% (20 MG/ML) 5 ML SYRINGE
INTRAMUSCULAR | Status: DC | PRN
  Administered 2017-10-02: 100 mg via INTRAVENOUS

## 2017-10-02 MED ORDER — ONDANSETRON HCL 4 MG/2ML IJ SOLN
INTRAMUSCULAR | Status: AC
  Filled 2017-10-02: qty 2

## 2017-10-02 MED ORDER — LACTATED RINGERS IV SOLN
INTRAVENOUS | Status: DC
  Administered 2017-10-02: 14:00:00 via INTRAVENOUS

## 2017-10-02 MED ORDER — HEPARIN (PORCINE) IN NACL 100-0.45 UNIT/ML-% IJ SOLN
1900.0000 [IU]/h | INTRAMUSCULAR | Status: DC
  Administered 2017-10-03: 1900 [IU]/h via INTRAVENOUS
  Filled 2017-10-02: qty 250

## 2017-10-02 MED ORDER — PHENYLEPHRINE 40 MCG/ML (10ML) SYRINGE FOR IV PUSH (FOR BLOOD PRESSURE SUPPORT)
PREFILLED_SYRINGE | INTRAVENOUS | Status: AC
  Filled 2017-10-02: qty 10

## 2017-10-02 MED ORDER — ONDANSETRON HCL 4 MG/2ML IJ SOLN
INTRAMUSCULAR | Status: DC | PRN
  Administered 2017-10-02: 4 mg via INTRAVENOUS

## 2017-10-02 MED ORDER — 0.9 % SODIUM CHLORIDE (POUR BTL) OPTIME
TOPICAL | Status: DC | PRN
  Administered 2017-10-02: 1000 mL

## 2017-10-02 MED ORDER — PROPOFOL 10 MG/ML IV BOLUS
INTRAVENOUS | Status: AC
  Filled 2017-10-02: qty 40

## 2017-10-02 MED ORDER — PHENYLEPHRINE 40 MCG/ML (10ML) SYRINGE FOR IV PUSH (FOR BLOOD PRESSURE SUPPORT)
PREFILLED_SYRINGE | INTRAVENOUS | Status: DC | PRN
  Administered 2017-10-02 (×4): 80 ug via INTRAVENOUS

## 2017-10-02 MED ORDER — FENTANYL CITRATE (PF) 250 MCG/5ML IJ SOLN
INTRAMUSCULAR | Status: AC
  Filled 2017-10-02: qty 5

## 2017-10-02 MED ORDER — HEPARIN BOLUS VIA INFUSION
3000.0000 [IU] | Freq: Once | INTRAVENOUS | Status: AC
Start: 2017-10-02 — End: 2017-10-02
  Administered 2017-10-02: 3000 [IU] via INTRAVENOUS
  Filled 2017-10-02: qty 3000

## 2017-10-02 MED ORDER — FENTANYL CITRATE (PF) 250 MCG/5ML IJ SOLN
INTRAMUSCULAR | Status: DC | PRN
  Administered 2017-10-02: 25 ug via INTRAVENOUS

## 2017-10-02 MED ORDER — DOXYCYCLINE HYCLATE 100 MG PO TABS
100.0000 mg | ORAL_TABLET | Freq: Two times a day (BID) | ORAL | Status: DC
  Administered 2017-10-02 – 2017-10-05 (×7): 100 mg via ORAL
  Filled 2017-10-02 (×6): qty 1

## 2017-10-02 MED ORDER — VANCOMYCIN HCL 10 G IV SOLR
1250.0000 mg | INTRAVENOUS | Status: DC
  Administered 2017-10-03: 1250 mg via INTRAVENOUS
  Filled 2017-10-02: qty 1250

## 2017-10-02 MED ORDER — MIDAZOLAM HCL 2 MG/2ML IJ SOLN
INTRAMUSCULAR | Status: DC | PRN
  Administered 2017-10-02: 1 mg via INTRAVENOUS

## 2017-10-02 MED ORDER — CEFAZOLIN SODIUM-DEXTROSE 2-3 GM-%(50ML) IV SOLR
INTRAVENOUS | Status: DC | PRN
  Administered 2017-10-02: 2 g via INTRAVENOUS

## 2017-10-02 MED ORDER — PROPOFOL 10 MG/ML IV BOLUS
INTRAVENOUS | Status: DC | PRN
  Administered 2017-10-02: 200 mg via INTRAVENOUS

## 2017-10-02 MED ORDER — LIDOCAINE 2% (20 MG/ML) 5 ML SYRINGE
INTRAMUSCULAR | Status: AC
  Filled 2017-10-02: qty 5

## 2017-10-02 SURGICAL SUPPLY — 48 items
BANDAGE GAUZE 4  KLING STR (GAUZE/BANDAGES/DRESSINGS) ×2 IMPLANT
BLADE AVERAGE 25X9 (BLADE) ×2 IMPLANT
BLADE SURG 15 STRL LF DISP TIS (BLADE) IMPLANT
BLADE SURG 15 STRL SS (BLADE) ×2
BNDG COHESIVE 6X5 TAN STRL LF (GAUZE/BANDAGES/DRESSINGS) ×1 IMPLANT
BNDG GAUZE ELAST 4 BULKY (GAUZE/BANDAGES/DRESSINGS) ×1 IMPLANT
COVER LIGHT HANDLE STERIS (MISCELLANEOUS) ×2 IMPLANT
COVER SURGICAL LIGHT HANDLE (MISCELLANEOUS) ×2 IMPLANT
CUFF TOURNIQUET SINGLE 24IN (TOURNIQUET CUFF) ×2 IMPLANT
DRSG EMULSION OIL 3X3 NADH (GAUZE/BANDAGES/DRESSINGS) ×2 IMPLANT
DURAPREP 26ML APPLICATOR (WOUND CARE) ×2 IMPLANT
ELECT CAUTERY BLADE 6.4 (BLADE) IMPLANT
ELECT REM PT RETURN 9FT ADLT (ELECTROSURGICAL) ×2
ELECTRODE REM PT RTRN 9FT ADLT (ELECTROSURGICAL) ×1 IMPLANT
FACESHIELD WRAPAROUND (MASK) ×2 IMPLANT
FACESHIELD WRAPAROUND OR TEAM (MASK) IMPLANT
GAUZE SPONGE 4X4 12PLY STRL (GAUZE/BANDAGES/DRESSINGS) ×2 IMPLANT
GAUZE XEROFORM 5X9 LF (GAUZE/BANDAGES/DRESSINGS) ×1 IMPLANT
GLOVE BIOGEL PI IND STRL 7.0 (GLOVE) ×1 IMPLANT
GLOVE BIOGEL PI INDICATOR 7.0 (GLOVE) ×1
GLOVE ECLIPSE 7.0 STRL STRAW (GLOVE) ×2 IMPLANT
GLOVE SKINSENSE NS SZ7.5 (GLOVE) ×3
GLOVE SKINSENSE STRL SZ7.5 (GLOVE) ×3 IMPLANT
GLOVE SURG SYN 7.5  E (GLOVE) ×1
GLOVE SURG SYN 7.5 E (GLOVE) ×1 IMPLANT
GLOVE SURG SYN 7.5 PF PI (GLOVE) ×1 IMPLANT
GOWN STRL REIN XL XLG (GOWN DISPOSABLE) ×2 IMPLANT
KIT BASIN OR (CUSTOM PROCEDURE TRAY) ×2 IMPLANT
KIT TURNOVER KIT B (KITS) ×2 IMPLANT
MANIFOLD NEPTUNE II (INSTRUMENTS) ×2 IMPLANT
NS IRRIG 1000ML POUR BTL (IV SOLUTION) ×2 IMPLANT
PACK ORTHO EXTREMITY (CUSTOM PROCEDURE TRAY) ×2 IMPLANT
PAD ABD 8X10 STRL (GAUZE/BANDAGES/DRESSINGS) ×2 IMPLANT
PAD ARMBOARD 7.5X6 YLW CONV (MISCELLANEOUS) ×4 IMPLANT
PAD CAST 4YDX4 CTTN HI CHSV (CAST SUPPLIES) IMPLANT
PADDING CAST COTTON 4X4 STRL (CAST SUPPLIES) ×2
SPONGE LAP 18X18 X RAY DECT (DISPOSABLE) ×2 IMPLANT
SUCTION FRAZIER HANDLE 10FR (MISCELLANEOUS) ×1
SUCTION TUBE FRAZIER 10FR DISP (MISCELLANEOUS) ×1 IMPLANT
SUT ETHILON 2 0 PSLX (SUTURE) ×5 IMPLANT
SUT MON AB 2-0 CT1 36 (SUTURE) ×2 IMPLANT
SUT PDS AB 0 CT 36 (SUTURE) IMPLANT
TOWEL OR 17X24 6PK STRL BLUE (TOWEL DISPOSABLE) IMPLANT
TOWEL OR 17X26 10 PK STRL BLUE (TOWEL DISPOSABLE) ×2 IMPLANT
TUBE CONNECTING 12X1/4 (SUCTIONS) ×2 IMPLANT
TUBING CYSTO DISP (UROLOGICAL SUPPLIES) ×2 IMPLANT
UNDERPAD 30X30 (UNDERPADS AND DIAPERS) ×2 IMPLANT
WATER STERILE IRR 1000ML POUR (IV SOLUTION) ×1 IMPLANT

## 2017-10-02 NOTE — Progress Notes (Signed)
Pharmacy Antibiotic Note  Daniel [name] is a 62 y.o. male admitted on 09/30/2017 with increased swelling of 2nd right toe, found to have diabetic foot infection/osteomyelitis.  Pharmacy has been consulted for vancomycin dosing.  Patient is also on Rocephin and Flagyl.  Renal function is worsening.  Afebrile, WBC WNL.   Plan: Change vanc to 127m IV Q24H CTX 2gm IV Q24H + Flagyl 509mIV Q8H per MD Monitor renal fxn, micro data, abx LOT post amputation F/U with resuming AC post surgery   Height: 5' 11"  (180.3 cm) Weight: 255 lb (115.7 kg) IBW/kg (Calculated) : 75.3  Temp (24hrs), Avg:98.1 F (36.7 C), Min:97.6 F (36.4 C), Max:98.6 F (37 C)  Recent Labs  Lab 09/28/17 1344 09/28/17 1357 09/30/17 1816 10/01/17 0441 10/02/17 0422  WBC 10.7*  --  8.6 6.3 5.7  CREATININE 1.45* 1.50* 1.39* 1.34* 1.48*    Estimated Creatinine Clearance: 65.3 mL/min (A) (by C-G formula based on SCr of 1.48 mg/dL (H)).    Allergies  Allergen Reactions  . Aspirin Swelling  . Ibuprofen Swelling  . Sustiva [Efavirenz] Swelling and Rash  . Nsaids Other (See Comments)    unknwn     HIV on Triumeq - CD4 590  Vanc 8/8 >> CTX 8/8 >> Flagyl   8/7 BCx - NGTD 8/8 MRSA PCR - negative   Daniel [name] D. DaMina MarblePharmD, BCPS, BCJugtown/10/2017, 9:21 AM

## 2017-10-02 NOTE — Progress Notes (Signed)
PROGRESS NOTE    Daniel [name] [facility]  XKG:[id number] DOB: [birth year] DOA: 09/30/2017 PCP: [name], MD   Brief Narrative:  Patient 62 year old gentleman with history of HIV, last CD4 count of 590 and viral load undetectable March 2019, history of depression with anxiety, insomnia, obstructive sleep apnea, chronic kidney disease stage III, insulin-dependent diabetes mellitus, history of DVT on Xarelto, hypothyroidism presenting to the ED with increased swelling drainage of his second toe on the right foot.  Work-up done consistent with diabetic foot infection with dislocation of second MTP and possible osteomyelitis.  Patient placed empirically on IV antibiotics.  Orthopedics consulted.  ID consulted.   Assessment & Plan:   Principal Problem:   Diabetic foot infection ([place]) Active Problems:   Human immunodeficiency virus (HIV) disease ([place])   Insulin-requiring or dependent type II diabetes mellitus ([place])   Chronic renal insufficiency, stage III (moderate) ([name])   OSA on CPAP   Major depressive disorder, recurrent episode, moderate ([name])   History of DVT (deep vein thrombosis)   Osteomyelitis ([name])   Hypothyroidism   Constipation  #1 diabetic foot infection/right foot cellulitis with osteomyelitis Patient presented with increased swelling and drainage second right toe.  Plain films revealled dislocation of second MTP and concern for possible osteomyelitis.  Blood cultures have been obtained and are pending.  CRP elevated at 24.9.  Sed rate at 90.  Patient seen in consultation by orthopedic surgery who were concerned about osteomyelitis and feel patient would likely need an amputation.  Patient has been seen by orthopedics and patient for transmetatarsal amputation this afternoon 10/02/2017.  Patient has also been seen in consultation by ID who are recommending to continue empiric IV vancomycin, IV Rocephin and IV Flagyl pending surgery and probable deep culture of right plantar foot ulcer  perioperatively.  Orthopedics and ID following.   2.  HIV disease Well-controlled.  Viral load undetectable in March.  CD4 count of 590.  Continue current regimen of triumeq.  ID following.  3.  Diabetes mellitus type 2, insulin-dependent Hemoglobin A1c was 7.6 on 09/21/2017.  Patient was initially made n.p.o. the night of 10/01/2107 and as such was only given half his home dose of Lantus.  Patient was placed back on home dose of Lantus 150 units daily on 10/01/2017.  Patient currently n.p.o. and awaiting transmetatarsal amputation and as such Lantus dose has been decreased to 75 units daily.  Continue sliding scale insulin.  Follow.   4.  History of DVT Xarelto on hold in anticipation of possible surgery.  Continue IV heparin.  5.  Chronic kidney disease stage III Stable.  Monitor closely as patient on IV vancomycin.  6.  Hypokalemia Magnesium level at 2.0.  Potassium repleted.  Follow.    7.  Hypothyroidism Continue home dose Synthroid.  8.  Obstructive sleep apnea CPAP nightly.  9.  Anxiety/depression/insomnia Stable.  Continue Depakote,trintellix  10.  Constipation Noted on abdominal films.  Continue bowel regimen of MiraLAX twice daily and Senokot as twice daily.   DVT prophylaxis: Heparin Code Status: Full Family Communication: Updated patient.  No family at bedside. Disposition Plan: SNF versus home with home health when okay with orthopedics and ID.   Consultants:   Orthopedics: Hilbert Odor, PA 10/01/2017  Infectious disease: [name] 10/01/2017  Procedures:  CT head CT C-spine 09/28/2017  CT chest 09/28/2017  CT abdomen and pelvis 09/28/2017  Plain films of the right foot 09/30/2017  Trans-metatarsal amputation pending 10/02/2017  Antimicrobials:   IV Rocephin 10/01/2017  IV Flagyl 10/01/2017  IV vancomycin 09/30/2017   Subjective: Asleep however easily arousable.  No chest pain or shortness of breath.   Objective: Vitals:   10/01/17 0440 10/01/17 0544  10/01/17 2031 10/02/17 0412  BP: 128/73 138/80 119/74 124/70  Pulse: 80 86 88 73  Resp: 17 18 17 17   Temp: 98.4 F (36.9 C) 98 F (36.7 C) 98.6 F (37 C) 97.6 F (36.4 C)  TempSrc: Oral Oral Oral Axillary  SpO2: 99% 98% 99% 94%  Weight:      Height:        Intake/Output Summary (Last 24 hours) at 10/02/2017 1312 Last data filed at 10/02/2017 1307 Gross per 24 hour  Intake 1458.04 ml  Output 1100 ml  Net 358.04 ml   Filed Weights   09/30/17 1756  Weight: 115.7 kg    Examination:  General exam: Sleeping.  Facial droop noted Respiratory system: CTAB.  No wheezes, no crackles, no rhonchi.   Cardiovascular system: Regular rate rhythm no murmurs rubs or gallops.  No JVD.  No lower extremity edema.  Gastrointestinal system: Abdomen is soft, nontender, nondistended, positive bowel sounds.  No hepatosplenomegaly.   Central nervous system: Alert and oriented. No focal neurological deficits. Extremities: Right lower extremity wrapped with drainage noted in dressing.  Symmetric 5 x 5 power. Skin: No rashes, lesions or ulcers Psychiatry: Judgement and insight appear normal. Mood & affect appropriate.     Data Reviewed: I have personally reviewed following labs and imaging studies  CBC: Recent Labs  Lab 09/28/17 1344 09/28/17 1357 09/30/17 1816 10/01/17 0441 10/02/17 0422  WBC 10.7*  --  8.6 6.3 5.7  NEUTROABS 8.0*  --  5.1 3.1  --   HGB 14.6 13.6 14.7 13.7 13.8  HCT 42.0 40.0 41.7 38.6* 39.7  MCV 96.1  --  95.0 93.7 94.7  PLT 108*  --  172 156 638   Basic Metabolic Panel: Recent Labs  Lab 09/28/17 1344 09/28/17 1357 09/30/17 1816 10/01/17 0441 10/01/17 1134 10/02/17 0422  NA 134* 137 134* 133*  --  136  K 4.1 4.3 3.8 3.4*  --  3.6  CL 102 103 103 102  --  101  CO2 26  --  23 23  --  24  GLUCOSE 181* 177* 182* 172*  --  120*  BUN 22 21 25* 22  --  21  CREATININE 1.45* 1.50* 1.39* 1.34*  --  1.48*  CALCIUM 8.4*  --  8.9 8.4*  --  8.8*  MG  --   --   --   --  2.0   --    GFR: Estimated Creatinine Clearance: 65.3 mL/min (A) (by C-G formula based on SCr of 1.48 mg/dL (H)). Liver Function Tests: Recent Labs  Lab 09/28/17 1344 09/30/17 1816  AST 22 30  ALT 26 32  ALKPHOS 77 83  BILITOT 1.3* 1.1  PROT 6.6 7.5  ALBUMIN 3.1* 3.3*   No results for input(s): LIPASE, AMYLASE in the last 168 hours. No results for input(s): AMMONIA in the last 168 hours. Coagulation Profile: No results for input(s): INR, PROTIME in the last 168 hours. Cardiac Enzymes: No results for input(s): CKTOTAL, CKMB, CKMBINDEX, TROPONINI in the last 168 hours. BNP (last 3 results) Recent Labs    12/11/16 1853  PROBNP 31   HbA1C: No results for input(s): HGBA1C in the last 72 hours. CBG: Recent Labs  Lab 10/01/17 1717 10/01/17 2110 10/02/17 0035 10/02/17 0505 10/02/17 1203  GLUCAP 125* 108*  115* 114* 80   Lipid Profile: No results for input(s): CHOL, HDL, LDLCALC, TRIG, CHOLHDL, LDLDIRECT in the last 72 hours. Thyroid Function Tests: No results for input(s): TSH, T4TOTAL, FREET4, T3FREE, THYROIDAB in the last 72 hours. Anemia Panel: No results for input(s): VITAMINB12, FOLATE, FERRITIN, TIBC, IRON, RETICCTPCT in the last 72 hours. Sepsis Labs: No results for input(s): PROCALCITON, LATICACIDVEN in the last 168 hours.  Recent Results (from the past 240 hour(s))  Blood culture (routine x 2)     Status: None (Preliminary result)   Collection Time: 09/30/17  6:12 PM  Result Value Ref Range Status   Specimen Description BLOOD RIGHT ARM  Final   Special Requests   Final    BOTTLES DRAWN AEROBIC AND ANAEROBIC Blood Culture adequate volume   Culture   Final    NO GROWTH 2 DAYS Performed at [facility], [address], [place], [place] [address]    Report Status PENDING  Incomplete  Blood culture (routine x 2)     Status: None (Preliminary result)   Collection Time: 09/30/17  9:34 PM  Result Value Ref Range Status   Specimen Description RIGHT ANTECUBITAL  Final     Special Requests   Final    BOTTLES DRAWN AEROBIC AND ANAEROBIC Blood Culture adequate volume   Culture   Final    NO GROWTH 2 DAYS Performed at [facility], [address] [place], [place] [address]    Report Status PENDING  Incomplete  MRSA PCR Screening     Status: None   Collection Time: 10/01/17 11:56 AM  Result Value Ref Range Status   MRSA by PCR NEGATIVE NEGATIVE Final    Comment:        The GeneXpert MRSA Assay (FDA approved for NASAL specimens only), is one component of a comprehensive MRSA colonization surveillance program. It is not intended to diagnose MRSA infection nor to guide or monitor treatment for MRSA infections. Performed at [facility], [place] [address], [place],  [address]          Radiology Studies: Dg Foot Complete Right  Result Date: 09/30/2017 CLINICAL DATA:  62 year old diabetic with 2-3 month history of blistering of the RIGHT foot after a deep treatment procedure. Currently patient has open wounds on the foot with marked swelling. Prior RIGHT great toe amputation. EXAM: RIGHT FOOT COMPLETE - 3+ VIEW COMPARISON:  09/30/2016, 06/27/2016 and earlier. FINDINGS: Severe diffuse soft tissue swelling, especially involving the great toe stump and the second toe. Dislocation of the second MTP joint, with slight osteolysis of the head of the proximal phalanx of the second toe. Flattening of the head of the third metatarsal with subchondral cyst formation since the prior examination 1 year ago. Joint space narrowing involving the fourth MTP joint, also new since the examination 1 year ago. Remaining joint spaces well-preserved. Mild osseous demineralization. IMPRESSION: 1. Dislocation of the second MTP joint with possible osteomyelitis involving the base of the proximal phalanx of the second toe. 2. Flattening of the head of the third metatarsal, new since the examination 1 year ago, possibly related to osteonecrosis. 3. Developing osteoarthritis  involving the fourth MTP joint. 4. Soft tissue swelling involving the right great toe stump and the second toe, associated with ulcerations. Electronically Signed   By: Evangeline Dakin M.D.   On: 09/30/2017 20:59        Scheduled Meds: . abacavir-dolutegravir-lamiVUDine  1 tablet Oral QHS  . amphetamine-dextroamphetamine  30 mg Oral TID  .  Brexpiprazole  1 mg Oral QHS  . diclofenac sodium  2 g Topical BID  . divalproex  500 mg Oral QHS  . feeding supplement (PRO-STAT SUGAR FREE 64)  30 mL Oral BID  . insulin aspart  0-15 Units Subcutaneous Q4H  . insulin glargine  75 Units Subcutaneous Daily  . levothyroxine  50 mcg Oral QHS  . polyethylene glycol  17 g Oral BID  . pregabalin  100 mg Oral TID  . protriptyline  10 mg Oral TID  . senna-docusate  1 tablet Oral BID  . vortioxetine HBr  20 mg Oral QHS  . zolpidem  10 mg Oral QHS   Continuous Infusions: . cefTRIAXone (ROCEPHIN)  IV 2 g (10/02/17 0534)  . dextrose 5 % and 0.9% NaCl 20 mL/hr at 10/02/17 1307  . metronidazole 500 mg (10/02/17 4081)  . [START ON 10/03/2017] vancomycin       LOS: 2 days    Time spent: 35 minutes    Irine Seal, MD [facility] Pager [phone]  If 7PM-7AM, please contact night-coverage [email or web address] Password [email or web address] 10/02/2017, 1:12 PM

## 2017-10-02 NOTE — Transfer of Care (Signed)
Immediate Anesthesia Transfer of Care Note  Patient: [name]  Procedure(s) Performed: RIGHT TRANSMETATARSAL AMPUTATION (Right )  Patient Location: PACU  Anesthesia Type:General  Level of Consciousness: awake and oriented  Airway & Oxygen Therapy: Patient Spontanous Breathing and Patient connected to nasal cannula oxygen  Post-op Assessment: Report given to RN and Post -op Vital signs reviewed and stable  Post vital signs: Reviewed and stable  Last Vitals:  Vitals Value Taken Time  BP 100/80 10/02/2017  4:56 PM  Temp    Pulse 77 10/02/2017  5:02 PM  Resp 18 10/02/2017  5:02 PM  SpO2 100 % 10/02/2017  5:02 PM  Vitals shown include unvalidated device data.  Last Pain:  Vitals:   10/02/17 1200  TempSrc:   PainSc: 0-No pain      Patients Stated Pain Goal: 2 (61/25/48 3234)  Complications: No apparent anesthesia complications

## 2017-10-02 NOTE — Anesthesia Preprocedure Evaluation (Addendum)
Anesthesia Evaluation  Patient identified by MRN, date of birth, ID band  Reviewed: Allergy & Precautions, NPO status , Patient's Chart, lab work & pertinent test results  Airway Mallampati: III  TM Distance: >3 FB Neck ROM: Full    Dental  (+) Dental Advisory Given, Poor Dentition, Chipped   Pulmonary sleep apnea , former smoker,    breath sounds clear to auscultation       Cardiovascular + Peripheral Vascular Disease and + DOE   Rhythm:Regular Rate:Normal     Neuro/Psych  Headaches,    GI/Hepatic GERD  ,  Endo/Other  diabetesHypothyroidism   Renal/GU Renal disease     Musculoskeletal   Abdominal   Peds  Hematology   Anesthesia Other Findings   Reproductive/Obstetrics                            Anesthesia Physical Anesthesia Plan  ASA: III  Anesthesia Plan: General   Post-op Pain Management:    Induction: Intravenous  PONV Risk Score and Plan: Ondansetron, Dexamethasone and Midazolam  Airway Management Planned: Oral ETT  Additional Equipment:   Intra-op Plan:   Post-operative Plan: Extubation in OR  Informed Consent: I have reviewed the patients History and Physical, chart, labs and discussed the procedure including the risks, benefits and alternatives for the proposed anesthesia with the patient or authorized representative who has indicated his/her understanding and acceptance.   Dental advisory given  Plan Discussed with: Anesthesiologist and CRNA  Anesthesia Plan Comments:        Anesthesia Quick Evaluation

## 2017-10-02 NOTE — Op Note (Addendum)
   Date of Surgery: 10/02/2017  INDICATIONS: Mr. Goetze is a 62 y.o.-year-old male with a right foot osteomyelitis and abscess;  The patient did consent to the procedure after discussion of the risks and benefits.  PREOPERATIVE DIAGNOSIS: Right foot abscess and osteomyelitis  POSTOPERATIVE DIAGNOSIS: Same.  PROCEDURE:  1.  Irrigation and debridement of bone, skin, muscle, tendon, fascia 15 x 15 cm 2.  Right foot transmetatarsal amputation 3.  Right foot adjacent tissue rearrangement 12 cm  SURGEON: N. Eduard Roux, M.D.  ASSIST: Ciro Backer Gray, [place]; necessary for the timely completion of procedure and due to complexity of procedure.  ANESTHESIA:  general  IV FLUIDS AND URINE: See anesthesia.  ESTIMATED BLOOD LOSS: Minimal mL.  IMPLANTS: None  DRAINS: None  COMPLICATIONS: None.  DESCRIPTION OF PROCEDURE: The patient was brought to the operating room and placed supine on the operating table.  The patient had been signed prior to the procedure and this was documented. The patient had the anesthesia placed by the anesthesiologist.  A time-out was performed to confirm that this was the correct patient, site, side and location. The patient did receive antibiotics prior to the incision and was re-dosed during the procedure as needed at indicated intervals.  A tourniquet was placed.  The patient had the operative extremity prepped and draped in the standard surgical fashion.    A large fishmouth incision was made over the right foot.  The plantar incision was made to ellipse out the draining sinus.  Sharp dissection was carried down onto the metatarsals.  Sharp excisional debridement was performed using a 15 blade.  Debridement was performed on the skin, muscle, bone, subcutaneous tissue.  After thorough debridement the decision was made to perform transmetatarsal amputation given the fact that the foot was not salvageable.  I then performed a transmetatarsal amputation with an oscillating  saw with a normal metatarsal cascade.  After this was done I then thoroughly irrigated the surgical wound with 6 L normal saline.  Tourniquet was then deflated hemostasis was obtained.  I then performed adjacent tissue rearrangement in order to close the surgical wound with 2-0 Monocryl and 2-0 nylon.  Sterile dressings were applied.  Patient tolerated procedure well had no immediate complications.  POSTOPERATIVE PLAN: Patient will be nonweightbearing to the right lower extremity until the wound is healed.  He will need to remain on doxycycline for 2 more weeks after surgery.  Azucena Cecil, MD [place] 4:26 PM

## 2017-10-02 NOTE — Anesthesia Postprocedure Evaluation (Signed)
Anesthesia Post Note  Patient: Daniel [name] [facility]  Procedure(s) Performed: RIGHT TRANSMETATARSAL AMPUTATION (Right )     Patient location during evaluation: PACU Anesthesia Type: General Level of consciousness: awake Pain management: pain level controlled Vital Signs Assessment: post-procedure vital signs reviewed and stable Respiratory status: spontaneous breathing Cardiovascular status: stable Postop Assessment: no apparent nausea or vomiting Anesthetic complications: no    Last Vitals:  Vitals:   10/01/17 2031 10/02/17 0412  BP: 119/74 124/70  Pulse: 88 73  Resp: 17 17  Temp: 37 C 36.4 C  SpO2: 99% 94%    Last Pain:  Vitals:   10/02/17 1200  TempSrc:   PainSc: 0-No pain                 Takeru Bose

## 2017-10-02 NOTE — H&P (Signed)

## 2017-10-02 NOTE — Consult Note (Signed)
ORTHOPAEDIC CONSULTATION  REQUESTING PHYSICIAN: Eugenie Filler, MD  Chief Complaint: right diabetic foot ulcer and osteomyelitis  HPI: Daniel [name] is a 62 y.o. male who presents with 1 week of right foot swelling, redness, drainage and sausage digit of 2nd toe.  He is a poorly controlled diabetic and is s/p great toe amp at [facility].  Has h/o DVT on xarelto.  Currently on IV heparin. Due to availability of Dr. Sharol Given, he has asked me to assume orthopedic care.  Past Medical History:  Diagnosis Date  . ADHD (attention deficit hyperactivity disorder)   . Anxiety   . Chronic kidney disease   . Clotting disorder ([place])   . Depression   . Diabetes mellitus without complication ([place])   . Diabetes mellitus, type II ([place])   . Dizziness 03/17/2015  . GERD (gastroesophageal reflux disease)   . HIV disease ([place])   . HIV infection ([place])   . Liver disease   . OSA (obstructive sleep apnea) 07/25/2015   Uses CPAP regularly  . Peripheral vascular disease ([place])   . Ulcer    Past Surgical History:  Procedure Laterality Date  . SMALL INTESTINE SURGERY    . STOMACH SURGERY    . TOE AMPUTATION Right 08/2016   right great toe   Social History   Socioeconomic History  . Marital status: Single    Spouse name: Not on file  . Number of children: Not on file  . Years of education: Not on file  . Highest education level: Not on file  Occupational History    Comment: DISABILITY  Social Needs  . Financial resource strain: Not hard at all  . Food insecurity:    Worry: Never true    Inability: Never true  . Transportation needs:    Medical: No    Non-medical: No  Tobacco Use  . Smoking status: Former Smoker    Packs/day: 0.10    Years: 10.00    Pack years: 1.00    Types: Cigars    Last attempt to quit: 08/09/2014    Years since quitting: 3.1  . Smokeless tobacco: Never Used  Substance and Sexual Activity  . Alcohol use: No    Alcohol/week: 0.0 standard drinks  . Drug use: No    . Sexual activity: Not Currently    Partners: Male    Comment: pt. declined condoms  Lifestyle  . Physical activity:    Days per week: 0 days    Minutes per session: 0 min  . Stress: Only a little  Relationships  . Social connections:    Talks on phone: More than three times a week    Gets together: Once a week    Attends religious service: Never    Active member of club or organization: No    Attends meetings of clubs or organizations: Not on file    Relationship status: Never married  Other Topics Concern  . Not on file  Social History Narrative   Epworth Sleepiness Scale = 7 (as of 03/16/2015)   Family History  Problem Relation Age of Onset  . Depression Brother   . Throat cancer Brother        half brother, never smoker  . COPD Mother   . Diabetes Neg Hx    - negative except otherwise stated in the family history section Allergies  Allergen Reactions  . Aspirin Swelling  . Ibuprofen Swelling  . Sustiva [Efavirenz] Swelling and Rash  . Nsaids Other (  See Comments)    unknwn   Prior to Admission medications   Medication Sig Start Date End Date Taking? Authorizing Provider  abacavir-dolutegravir-lamiVUDine (TRIUMEQ) 600-50-300 MG tablet Take 1 tablet by mouth daily. Patient taking differently: Take 1 tablet by mouth at bedtime.  06/09/17  Yes Comer, Okey Regal, MD  acetaminophen (TYLENOL) 500 MG tablet Take 1,000-1,500 mg by mouth every 8 (eight) hours as needed for moderate pain.    Yes [provider]  ALPRAZolam Duanne Moron) 1 MG tablet Take 1 mg by mouth 4 (four) times daily as needed for anxiety.    Yes [provider]  amphetamine-dextroamphetamine (ADDERALL) 30 MG tablet Take 30 mg by mouth 3 (three) times daily.   Yes [provider]  Brexpiprazole (REXULTI) 1 MG TABS Take 1 mg by mouth at bedtime.   Yes [provider]  diclofenac sodium (VOLTAREN) 1 % GEL APPLY 2 GRAMS TO EACH KNEE IN THE MORNING AND AT BEDTIME AND APPLY 1 GRAM TO  EACH KNEE IN THE AFTERNOON. 05/16/17  Yes [name], MD  divalproex (DEPAKOTE ER) 500 MG 24 hr tablet TAKE 1 TABLET BY MOUTH EVERY NIGHT AT BEDTIME 09/15/17  Yes Marcial Pacas, MD  insulin aspart (NOVOLOG FLEXPEN) 100 UNIT/ML FlexPen 10 units with breakfast, and 20 units with evening meal Patient taking differently: Inject 10-20 Units into the skin See admin instructions. 10 units with breakfast, and 20 units with evening meal 09/21/17  Yes Renato Shin, MD  Insulin Glargine (LANTUS SOLOSTAR) 100 UNIT/ML Solostar Pen Inject 150 Units into the skin every morning. 09/21/17  Yes Renato Shin, MD  levothyroxine (SYNTHROID, LEVOTHROID) 50 MCG tablet Take 1 tablet (50 mcg total) by mouth at bedtime. 07/24/15  Yes [name], MD  LYRICA 100 MG capsule TAKE 1 CAPSULE BY MOUTH THREE TIMES DAILY 09/22/17  Yes [name], MD  ondansetron (ZOFRAN) 8 MG tablet Take 8 mg by mouth every 8 (eight) hours as needed for nausea or vomiting.   Yes [provider]  protriptyline (VIVACTIL) 10 MG tablet Take 10 mg by mouth 3 (three) times daily.  01/30/16  Yes [provider]  ranitidine (ZANTAC) 150 MG tablet Take 150 mg by mouth daily as needed.    Yes [provider]  TRINTELLIX 20 MG TABS Take 20 mg by mouth at bedtime. 01/26/15  Yes [provider]  XARELTO 15 MG TABS tablet TAKE 1 TABLET BY MOUTH EVERY DAY 09/22/17  Yes [name], MD  zolpidem (AMBIEN) 10 MG tablet Take 10 mg by mouth at bedtime.    Yes [provider]  Continuous Blood Gluc Receiver (FREESTYLE LIBRE 14 DAY READER) DEVI AS DIRECTED 08/13/17   Renato Shin, MD  Continuous Blood Gluc Sensor (FREESTYLE LIBRE 14 DAY SENSOR) MISC 1 Device by Does not apply route every 14 (fourteen) days. 07/15/17   Renato Shin, MD  glucose blood (FREESTYLE PRECISION NEO TEST) test strip Used to check blood sugars twice daily. 07/28/17   Renato Shin, MD  Insulin Pen Needle (B-D ULTRAFINE III SHORT PEN) 31G X 8  MM MISC USE TO INJECT LANTUS AND NOVOLOG DAILY 09/15/17   Renato Shin, MD  UNABLE TO FIND CPAP MACHINE with standard [employer] nasal mask with humidifier. Set at 14 cwp Patient taking differently: CPAP MACHINE with standard Aclaim nasal mask with humidifier. Set at 4 cwp 04/12/13   [name], MD   Dg Foot Complete Right  Result Date: 09/30/2017 CLINICAL DATA:  62 year old diabetic  with 2-3 month history of blistering of the RIGHT foot after a deep treatment procedure. Currently patient has open wounds on the foot with marked swelling. Prior RIGHT great toe amputation. EXAM: RIGHT FOOT COMPLETE - 3+ VIEW COMPARISON:  09/30/2016, 06/27/2016 and earlier. FINDINGS: Severe diffuse soft tissue swelling, especially involving the great toe stump and the second toe. Dislocation of the second MTP joint, with slight osteolysis of the head of the proximal phalanx of the second toe. Flattening of the head of the third metatarsal with subchondral cyst formation since the prior examination 1 year ago. Joint space narrowing involving the fourth MTP joint, also new since the examination 1 year ago. Remaining joint spaces well-preserved. Mild osseous demineralization. IMPRESSION: 1. Dislocation of the second MTP joint with possible osteomyelitis involving the base of the proximal phalanx of the second toe. 2. Flattening of the head of the third metatarsal, new since the examination 1 year ago, possibly related to osteonecrosis. 3. Developing osteoarthritis involving the fourth MTP joint. 4. Soft tissue swelling involving the right great toe stump and the second toe, associated with ulcerations. Electronically Signed   By: Evangeline Dakin M.D.   On: 09/30/2017 20:59   - pertinent xrays, CT, MRI studies were reviewed and independently interpreted  Positive ROS: All other systems have been reviewed and were otherwise negative with the exception of those mentioned in the HPI and as above.  Physical Exam: General: Alert, no  acute distress Cardiovascular: No pedal edema Respiratory: No cyanosis, no use of accessory musculature GI: No organomegaly, abdomen is soft and non-tender Skin: No lesions in the area of chief complaint Neurologic: Sensation decreased distally Psychiatric: Patient is competent for consent with normal mood and affect Lymphatic: No axillary or cervical lymphadenopathy  MUSCULOSKELETAL:  - strong DP pulse - plantar ulcer of 2nd MT head with slight drainage - sausage digit of 2nd toe, cellulitis, epidermiolysis  Assessment: Severe right diabetic foot ulcer and osteomyelitis  Plan: - needs transmet amp today, hopefully he is able to heal from this and that the infection hasn't progressed too far proximally - consent obtained  Thank you for the consult and the opportunity to see Daniel [name]. Eduard Roux, MD [place] 7:48 AM

## 2017-10-02 NOTE — Progress Notes (Signed)
ANTICOAGULATION CONSULT NOTE - Follow Up Consult  Pharmacy Consult for heparin Indication: DVT  Labs: Recent Labs    09/30/17 1816 09/30/17 2317 10/01/17 0441 10/02/17 0422  HGB 14.7  --  13.7 13.8  HCT 41.7  --  38.6* 39.7  PLT 172  --  156 158  APTT  --  30  --   --   HEPARINUNFRC  --  0.42 0.33 0.19*  CREATININE 1.39*  --  1.34* 1.48*    Assessment: 62yo male now subtherapeutic on heparin after two levels at goal though had been trending down.  Goal of Therapy:  Heparin level 0.3-0.7 units/ml   Plan:  Will rebolus with heparin 3000 units and increase heparin gtt by 3 units/kgABW/hr to 1900 units/hr and check level in 6 hours.    Wynona Neat, PharmD, BCPS  10/02/2017,6:32 AM

## 2017-10-02 NOTE — Progress Notes (Signed)
ANTICOAGULATION CONSULT NOTE - Follow Up Consult  Pharmacy Consult for heparin Indication: DVT  Labs: Recent Labs    09/30/17 1816 09/30/17 2317 10/01/17 0441 10/02/17 0422  HGB 14.7  --  13.7 13.8  HCT 41.7  --  38.6* 39.7  PLT 172  --  156 158  APTT  --  30  --   --   HEPARINUNFRC  --  0.42 0.33 0.19*  CREATININE 1.39*  --  1.34* 1.48*    Assessment: 62yo male now subtherapeutic on heparin after two levels at goal though had been trending down.  Patient now s/p transmetatarsal amputation. Restart of heparin d/w [name]. Will restart heparin at last rate at 1630 on 8/10 (24hours post-op).   Goal of Therapy:  Heparin level 0.3-0.7 units/ml   Plan:  Restart heparin at 1900 units/hr at 1630 on 8/10   Reeta Kuk A. Levada Dy, PharmD, [place] Pager: [phone] Please utilize Amion for appropriate phone number to reach the unit pharmacist ([place])    10/02/2017,6:58 PM

## 2017-10-02 NOTE — Anesthesia Procedure Notes (Signed)
Procedure Name: LMA Insertion Date/Time: 10/02/2017 3:25 PM Performed by: Harden Mo, CRNA Pre-anesthesia Checklist: Patient identified, Emergency Drugs available, Suction available and Patient being monitored Patient Re-evaluated:Patient Re-evaluated prior to induction Oxygen Delivery Method: Circle System Utilized Preoxygenation: Pre-oxygenation with 100% oxygen Induction Type: IV induction LMA: LMA inserted LMA Size: 5.0 Number of attempts: 1 Airway Equipment and Method: Bite block Placement Confirmation: positive ETCO2 Tube secured with: Tape Dental Injury: Teeth and Oropharynx as per pre-operative assessment

## 2017-10-03 ENCOUNTER — Encounter (HOSPITAL_COMMUNITY): Payer: Self-pay | Admitting: Orthopaedic Surgery

## 2017-10-03 LAB — CBC WITH DIFFERENTIAL/PLATELET
Abs Immature Granulocytes: 0.3 10*3/uL — ABNORMAL HIGH (ref 0.0–0.1)
BASOS ABS: 0.1 10*3/uL (ref 0.0–0.1)
Basophils Relative: 1 %
EOS ABS: 0.1 10*3/uL (ref 0.0–0.7)
EOS PCT: 2 %
HCT: 42.1 % (ref 39.0–52.0)
Hemoglobin: 14.3 g/dL (ref 13.0–17.0)
Immature Granulocytes: 4 %
Lymphocytes Relative: 20 %
Lymphs Abs: 1.5 10*3/uL (ref 0.7–4.0)
MCH: 31.8 pg (ref 26.0–34.0)
MCHC: 34 g/dL (ref 30.0–36.0)
MCV: 93.8 fL (ref 78.0–100.0)
Monocytes Absolute: 1.2 10*3/uL — ABNORMAL HIGH (ref 0.1–1.0)
Monocytes Relative: 15 %
Neutro Abs: 4.6 10*3/uL (ref 1.7–7.7)
Neutrophils Relative %: 58 %
Platelets: 200 10*3/uL (ref 150–400)
RBC: 4.49 MIL/uL (ref 4.22–5.81)
RDW: 12.7 % (ref 11.5–15.5)
WBC: 7.8 10*3/uL (ref 4.0–10.5)

## 2017-10-03 LAB — HEPARIN LEVEL (UNFRACTIONATED)

## 2017-10-03 LAB — GLUCOSE, CAPILLARY
GLUCOSE-CAPILLARY: 88 mg/dL (ref 70–99)
GLUCOSE-CAPILLARY: 120 mg/dL — AB (ref 70–99)
GLUCOSE-CAPILLARY: 166 mg/dL — AB (ref 70–99)
GLUCOSE-CAPILLARY: 117 mg/dL — AB (ref 70–99)
Glucose-Capillary: 112 mg/dL — ABNORMAL HIGH (ref 70–99)
Glucose-Capillary: 155 mg/dL — ABNORMAL HIGH (ref 70–99)
Glucose-Capillary: 199 mg/dL — ABNORMAL HIGH (ref 70–99)
Glucose-Capillary: 97 mg/dL (ref 70–99)

## 2017-10-03 LAB — BASIC METABOLIC PANEL
Anion gap: 8 (ref 5–15)
BUN: 19 mg/dL (ref 8–23)
CALCIUM: 8.8 mg/dL — AB (ref 8.9–10.3)
CO2: 26 mmol/L (ref 22–32)
CREATININE: 1.54 mg/dL — AB (ref 0.61–1.24)
Chloride: 104 mmol/L (ref 98–111)
GFR calc non Af Amer: 46 mL/min — ABNORMAL LOW (ref >60)
GFR, EST AFRICAN AMERICAN: 53 mL/min — AB (ref >60)
Glucose, Bld: 135 mg/dL — ABNORMAL HIGH (ref 70–99)
Potassium: 4.5 mmol/L (ref 3.5–5.1)
SODIUM: 138 mmol/L (ref 135–145)

## 2017-10-03 MED ORDER — PANTOPRAZOLE SODIUM 40 MG PO TBEC
40.0000 mg | DELAYED_RELEASE_TABLET | Freq: Every day | ORAL | Status: DC
  Administered 2017-10-04 – 2017-10-05 (×2): 40 mg via ORAL
  Filled 2017-10-03 (×2): qty 1

## 2017-10-03 MED ORDER — HEPARIN (PORCINE) IN NACL 100-0.45 UNIT/ML-% IJ SOLN
2500.0000 [IU]/h | INTRAMUSCULAR | Status: DC
  Administered 2017-10-04: 2200 [IU]/h via INTRAVENOUS
  Filled 2017-10-03 (×2): qty 250

## 2017-10-03 MED ORDER — GI COCKTAIL ~~LOC~~
30.0000 mL | Freq: Three times a day (TID) | ORAL | Status: DC | PRN
  Administered 2017-10-05: 30 mL via ORAL
  Filled 2017-10-03 (×2): qty 30

## 2017-10-03 MED ORDER — INSULIN ASPART 100 UNIT/ML ~~LOC~~ SOLN
10.0000 [IU] | Freq: Every day | SUBCUTANEOUS | Status: DC
  Administered 2017-10-03 – 2017-10-04 (×2): 10 [IU] via SUBCUTANEOUS

## 2017-10-03 MED ORDER — KETOTIFEN FUMARATE 0.025 % OP SOLN
1.0000 [drp] | Freq: Two times a day (BID) | OPHTHALMIC | Status: DC
  Administered 2017-10-03 – 2017-10-05 (×4): 1 [drp] via OPHTHALMIC
  Filled 2017-10-03: qty 5

## 2017-10-03 MED ORDER — INSULIN ASPART 100 UNIT/ML ~~LOC~~ SOLN
10.0000 [IU] | Freq: Every day | SUBCUTANEOUS | Status: DC
  Administered 2017-10-04 – 2017-10-05 (×2): 10 [IU] via SUBCUTANEOUS

## 2017-10-03 MED ORDER — ARTIFICIAL TEARS OPHTHALMIC OINT
TOPICAL_OINTMENT | Freq: Three times a day (TID) | OPHTHALMIC | Status: DC
  Administered 2017-10-03 – 2017-10-04 (×4): via OPHTHALMIC
  Administered 2017-10-04: 1 via OPHTHALMIC
  Administered 2017-10-05: 12:00:00 via OPHTHALMIC
  Administered 2017-10-05: 1 via OPHTHALMIC
  Filled 2017-10-03: qty 3.5

## 2017-10-03 NOTE — Progress Notes (Signed)
PROGRESS NOTE    Daniel [name] [facility]  EXH:[id number] DOB: 02-10-1956 DOA: 09/30/2017 PCP: [name], MD   Brief Narrative:  Patient 62 year old gentleman with history of HIV, last CD4 count of 590 and viral load undetectable March 2019, history of depression with anxiety, insomnia, obstructive sleep apnea, chronic kidney disease stage III, insulin-dependent diabetes mellitus, history of DVT on Xarelto, hypothyroidism presenting to the ED with increased swelling drainage of his second toe on the right foot.  Work-up done consistent with diabetic foot infection with dislocation of second MTP and possible osteomyelitis.  Patient placed empirically on IV antibiotics.  Orthopedics consulted.  ID consulted.   Assessment & Plan:   Principal Problem:   Diabetic foot infection ([place]) Active Problems:   Human immunodeficiency virus (HIV) disease ([place])   Insulin-requiring or dependent type II diabetes mellitus ([place])   Chronic renal insufficiency, stage III (moderate) ([name])   OSA on CPAP   Major depressive disorder, recurrent episode, moderate ([name])   History of DVT (deep vein thrombosis)   Osteomyelitis ([name])   Hypothyroidism   Constipation  #1 diabetic foot infection/right foot cellulitis with osteomyelitis Patient presented with increased swelling and drainage second right toe.  Plain films revealled dislocation of second MTP and concern for possible osteomyelitis.  Blood cultures have been obtained and are pending.  CRP elevated at 24.9.  Sed rate at 90.  Patient seen in consultation by orthopedic surgery who were concerned about osteomyelitis and feel patient would likely need an amputation.  Patient has been seen by orthopedics and patient s/p transmetatarsal amputation on 10/02/2017.  Patient has also been seen in consultation by ID who are recommending to continue empiric IV vancomycin, IV Rocephin and IV Flagyl pending surgery and probable deep culture of right plantar foot ulcer  perioperatively.  Orthopedics recommending 2 weeks of oral doxycycline postoperatively.  Orthopedics and ID following.   2.  HIV disease Well-controlled.  Viral load undetectable in March.  CD4 count of 590.  Continue current regimen of triumeq.  ID following.  3.  Diabetes mellitus type 2, insulin-dependent Hemoglobin A1c was 7.6 on 09/21/2017.  Patient was initially made n.p.o. the night of 10/01/2107 and as such was only given half his home dose of Lantus.  Patient was placed back on home dose of Lantus 150 units daily on 10/01/2017.  Patient's his dose was decreased to 75 units daily in anticipation of surgery on 10/02/2017.  Patient had been placed back on oral diet will place back on his home dose Lantus at 150 units tomorrow 10/04/2017.  Placed on NovoLog 10 units twice daily for meal coverage.  Continue sliding scale insulin.    4.  History of DVT Xarelto on hold in anticipation of possible surgery.  IV heparin was held perioperatively.  IV heparin to be resumed this afternoon 24 hours postop.  Monitor over the next 24 hours and if no bleeding and hemoglobin stable could likely resume his Eliquis in the next 24 to 48 hours.   5.  Chronic kidney disease stage III Renal function seems stable.  Monitor closely while on empiric IV vancomycin.   6.  Hypokalemia Magnesium level at 2.0.  Potassium repleted.  Follow.    7.  Hypothyroidism Continue home dose Synthroid.  8.  Obstructive sleep apnea CPAP nightly.  9.  Anxiety/depression/insomnia Continue Depakote and Trintellix.    10.  Constipation Noted on abdominal films.  Continue bowel regimen of MiraLAX twice daily and Senokot as twice daily.  11.  History of Bell's palsy /right eye discomfort Patient with complaints of right eye discomfort with history of Bell's palsy.  Patient states was supposed to have a patch on his right eye however his dog ate 3 office patches.  Patient states was supposed to follow-up with an ophthalmologist in the  outpatient setting and as such we will follow-up with him on discharge hopefully early next week.??  For corneal abrasion.  Place on Lacri-Lube 3 times daily.   DVT prophylaxis: Heparin Code Status: Full Family Communication: Updated patient.  No family at bedside. Disposition Plan: SNF versus home with home health when okay with orthopedics and ID.   Consultants:   Orthopedics: Hilbert Odor, PA 10/01/2017  Infectious disease: [name] 10/01/2017  Procedures:  CT head CT C-spine 09/28/2017  CT chest 09/28/2017  CT abdomen and pelvis 09/28/2017  Plain films of the right foot 09/30/2017  Trans-metatarsal amputation 10/02/2017--Per Dr Erlinda Hong  Antimicrobials:   IV Rocephin 10/01/2017  IV Flagyl 10/01/2017  IV vancomycin 09/30/2017   Subjective: Sitting up in bed.  Complaining of feeling that there is something in his eye although cannot seem to get it out.  No chest pain.  No shortness of breath.    Objective: Vitals:   10/02/17 1811 10/02/17 1835 10/02/17 2021 10/03/17 0423  BP: 110/64 112/69 (!) 104/59 124/72  Pulse: 68 70 86 94  Resp: 18 16    Temp:  (!) 97.4 F (36.3 C) 97.7 F (36.5 C) 98.7 F (37.1 C)  TempSrc:  Oral Oral Oral  SpO2: 100% 100% 99% 99%  Weight:      Height:        Intake/Output Summary (Last 24 hours) at 10/03/2017 1304 Last data filed at 10/03/2017 0900 Gross per 24 hour  Intake 3399.46 ml  Output 870 ml  Net 2529.46 ml   Filed Weights   09/30/17 1756 10/02/17 1403  Weight: 115.7 kg 115 kg    Examination:  General exam: Sitting up in bed.  Respiratory system: Lungs are to auscultation bilaterally.  No wheezes, no crackles, no rhonchi.  Cardiovascular system: RRR no murmurs rubs or gallops.  No JVD.  No lower extremity edema.  Gastrointestinal system: Abdomen is nontender, nondistended, soft, positive bowel sounds.  No hepatosplenomegaly.   Central nervous system: Alert and oriented. No focal neurological deficits. Extremities: Right lower  extremity wrapped in bandage status post transmetatarsal amputation.  Skin: No rashes, lesions or ulcers Psychiatry: Judgement and insight appear normal. Mood & affect appropriate.     Data Reviewed: I have personally reviewed following labs and imaging studies  CBC: Recent Labs  Lab 09/28/17 1344 09/28/17 1357 09/30/17 1816 10/01/17 0441 10/02/17 0422 10/03/17 0431  WBC 10.7*  --  8.6 6.3 5.7 7.8  NEUTROABS 8.0*  --  5.1 3.1  --  4.6  HGB 14.6 13.6 14.7 13.7 13.8 14.3  HCT 42.0 40.0 41.7 38.6* 39.7 42.1  MCV 96.1  --  95.0 93.7 94.7 93.8  PLT 108*  --  172 156 158 478   Basic Metabolic Panel: Recent Labs  Lab 09/28/17 1344 09/28/17 1357 09/30/17 1816 10/01/17 0441 10/01/17 1134 10/02/17 0422 10/03/17 0431  NA 134* 137 134* 133*  --  136 138  K 4.1 4.3 3.8 3.4*  --  3.6 4.5  CL 102 103 103 102  --  101 104  CO2 26  --  23 23  --  24 26  GLUCOSE 181* 177* 182* 172*  --  120* 135*  BUN 22 21 25* 22  --  21 19  CREATININE 1.45* 1.50* 1.39* 1.34*  --  1.48* 1.54*  CALCIUM 8.4*  --  8.9 8.4*  --  8.8* 8.8*  MG  --   --   --   --  2.0  --   --    GFR: Estimated Creatinine Clearance: 62.5 mL/min (A) (by C-G formula based on SCr of 1.54 mg/dL (H)). Liver Function Tests: Recent Labs  Lab 09/28/17 1344 09/30/17 1816  AST 22 30  ALT 26 32  ALKPHOS 77 83  BILITOT 1.3* 1.1  PROT 6.6 7.5  ALBUMIN 3.1* 3.3*   No results for input(s): LIPASE, AMYLASE in the last 168 hours. No results for input(s): AMMONIA in the last 168 hours. Coagulation Profile: No results for input(s): INR, PROTIME in the last 168 hours. Cardiac Enzymes: No results for input(s): CKTOTAL, CKMB, CKMBINDEX, TROPONINI in the last 168 hours. BNP (last 3 results) Recent Labs    12/11/16 1853  PROBNP 31   HbA1C: No results for input(s): HGBA1C in the last 72 hours. CBG: Recent Labs  Lab 10/02/17 2024 10/03/17 0020 10/03/17 0426 10/03/17 0802 10/03/17 1148  GLUCAP 172* 97 120* 199* 166*    Lipid Profile: No results for input(s): CHOL, HDL, LDLCALC, TRIG, CHOLHDL, LDLDIRECT in the last 72 hours. Thyroid Function Tests: No results for input(s): TSH, T4TOTAL, FREET4, T3FREE, THYROIDAB in the last 72 hours. Anemia Panel: No results for input(s): VITAMINB12, FOLATE, FERRITIN, TIBC, IRON, RETICCTPCT in the last 72 hours. Sepsis Labs: No results for input(s): PROCALCITON, LATICACIDVEN in the last 168 hours.  Recent Results (from the past 240 hour(s))  Blood culture (routine x 2)     Status: None (Preliminary result)   Collection Time: 09/30/17  6:12 PM  Result Value Ref Range Status   Specimen Description BLOOD RIGHT ARM  Final   Special Requests   Final    BOTTLES DRAWN AEROBIC AND ANAEROBIC Blood Culture adequate volume   Culture   Final    NO GROWTH 3 DAYS Performed at [facility], [address], [place], [place] [address]    Report Status PENDING  Incomplete  Blood culture (routine x 2)     Status: None (Preliminary result)   Collection Time: 09/30/17  9:34 PM  Result Value Ref Range Status   Specimen Description RIGHT ANTECUBITAL  Final   Special Requests   Final    BOTTLES DRAWN AEROBIC AND ANAEROBIC Blood Culture adequate volume   Culture   Final    NO GROWTH 3 DAYS Performed at [facility], [address] [place], [place] [address]    Report Status PENDING  Incomplete  MRSA PCR Screening     Status: None   Collection Time: 10/01/17 11:56 AM  Result Value Ref Range Status   MRSA by PCR NEGATIVE NEGATIVE Final    Comment:        The GeneXpert MRSA Assay (FDA approved for NASAL specimens only), is one component of a comprehensive MRSA colonization surveillance program. It is not intended to diagnose MRSA infection nor to guide or monitor treatment for MRSA infections. Performed at [facility], [place] [address], [place], [place] [address]          Radiology Studies: No results found.      Scheduled Meds: .  abacavir-dolutegravir-lamiVUDine  1 tablet Oral QHS  . amphetamine-dextroamphetamine  30 mg Oral TID  . Brexpiprazole  1 mg Oral QHS  . diclofenac sodium  2 g Topical BID  . divalproex  500 mg Oral QHS  . doxycycline  100 mg Oral Q12H  . feeding supplement (PRO-STAT SUGAR FREE 64)  30 mL Oral BID  . insulin aspart  0-15 Units Subcutaneous Q4H  . insulin aspart  10 Units Subcutaneous QAC supper  . insulin glargine  75 Units Subcutaneous Daily  . levothyroxine  50 mcg Oral QHS  . polyethylene glycol  17 g Oral BID  . pregabalin  100 mg Oral TID  . protriptyline  10 mg Oral TID  . senna-docusate  1 tablet Oral BID  . vortioxetine HBr  20 mg Oral QHS  . zolpidem  10 mg Oral QHS   Continuous Infusions: . cefTRIAXone (ROCEPHIN)  IV 2 g (10/03/17 0609)  . dextrose 5 % and 0.9% NaCl Stopped (10/02/17 1904)  . heparin    . lactated ringers Stopped (10/02/17 1904)  . metronidazole 500 mg (10/03/17 0820)  . vancomycin Stopped (10/03/17 0158)     LOS: 3 days    Time spent: 35 minutes    Irine Seal, MD [facility] Pager [phone]  If 7PM-7AM, please contact night-coverage [email or web address] Password [email or web address] 10/03/2017, 1:04 PM

## 2017-10-03 NOTE — Progress Notes (Signed)
RN called materials to try and see if they had an eye patch no  Answer will try back again shortly.

## 2017-10-03 NOTE — Progress Notes (Signed)
Pt told RN that his right eye hurts, and feels like there is something in his eye RN paged Hospitalist awaiting call back.

## 2017-10-03 NOTE — Progress Notes (Signed)
Patient states he is able to place self on CPAP when ready. RT filled water chamber with sterile water and informed patient if he has any trouble have RN contact RT.

## 2017-10-03 NOTE — Progress Notes (Addendum)
[name] for Infectious Disease    Date of Admission:  09/30/2017   Total days of antibiotics 4           ID: Daniel [name] is a 62 y.o. male with  Well controlled hiv diseae, DM with DFU/osteo c/b neuropathy Principal Problem:   Diabetic foot infection ([place]) Active Problems:   Human immunodeficiency virus (HIV) disease ([place])   Insulin-requiring or dependent type II diabetes mellitus ([place])   Chronic renal insufficiency, stage III (moderate) ([name])   OSA on CPAP   Major depressive disorder, recurrent episode, moderate ([name])   History of DVT (deep vein thrombosis)   Osteomyelitis ([name])   Hypothyroidism   Constipation    Subjective: Underwent TM amputation of right foot last night. He is having significant eye pain in right eye. "feels like something is there like a stye" Medications:  . abacavir-dolutegravir-lamiVUDine  1 tablet Oral QHS  . amphetamine-dextroamphetamine  30 mg Oral TID  . Brexpiprazole  1 mg Oral QHS  . diclofenac sodium  2 g Topical BID  . divalproex  500 mg Oral QHS  . doxycycline  100 mg Oral Q12H  . feeding supplement (PRO-STAT SUGAR FREE 64)  30 mL Oral BID  . insulin aspart  0-15 Units Subcutaneous Q4H  . insulin aspart  10 Units Subcutaneous QAC supper  . insulin glargine  75 Units Subcutaneous Daily  . levothyroxine  50 mcg Oral QHS  . polyethylene glycol  17 g Oral BID  . pregabalin  100 mg Oral TID  . protriptyline  10 mg Oral TID  . senna-docusate  1 tablet Oral BID  . vortioxetine HBr  20 mg Oral QHS  . zolpidem  10 mg Oral QHS    Objective: Vital signs in last 24 hours: Temp:  [97 F (36.1 C)-98.9 F (37.2 C)] 98.9 F (37.2 C) (08/10 1327) Pulse Rate:  [67-94] 94 (08/10 1327) Resp:  [16-23] 16 (08/10 1327) BP: (99-124)/(58-80) 122/64 (08/10 1327) SpO2:  [95 %-100 %] 95 % (08/10 1327) Physical Exam  Constitutional: He is oriented to person, place, and time. He appears well-developed and well-nourished. No distress.  HENT:  both eyes injected by R> L. Had initial pain looking laterally, but then just caused him to mild discomfort Mouth/Throat: Oropharynx is clear and moist. No oropharyngeal exudate.  Cardiovascular: Normal rate, regular rhythm and normal heart sounds. Exam reveals no gallop and no friction rub.  No murmur heard.  Pulmonary/Chest: Effort normal and breath sounds normal. No respiratory distress. He has no wheezes.  Abdominal: Soft. Bowel sounds are normal. He exhibits no distension. There is no tenderness.  Ext: right foot TM amputation-wrapped Neurological: He is alert and oriented to person, place, and time.  Skin: Skin is warm and dry. No rash noted. No erythema.  Psychiatric: He has a normal mood and affect. His behavior is normal.     Lab Results Recent Labs    10/02/17 0422 10/03/17 0431  WBC 5.7 7.8  HGB 13.8 14.3  HCT 39.7 42.1  NA 136 138  K 3.6 4.5  CL 101 104  CO2 24 26  BUN 21 19  CREATININE 1.48* 1.54*   Liver Panel Recent Labs    09/30/17 1816  PROT 7.5  ALBUMIN 3.3*  AST 30  ALT 32  ALKPHOS 83  BILITOT 1.1   Sedimentation Rate Recent Labs    09/30/17 1816  ESRSEDRATE 90*   C-Reactive Protein Recent Labs    09/30/17 2134  CRP 24.9*    Microbiology: No tissue sent  Assessment/Plan: hiv disease= continue with triumeq  DFU s/p amputation = suspect that he had clear margins, dr Erlinda Hong recommending 2 wk of doxycycline but would also add amox/clav and d/c metronidazole and ceftriaxone at discharge  Eye discomfort = unable to see if there is a foreign object. Would try moisturizing eye drops to see if any improvement.  Will sign off. Pt to follow up with Dr Linus Salmons in the coming month  [facility] for Infectious Diseases Cell: [phone] Pager: [phone]  10/03/2017, 3:56 PM

## 2017-10-03 NOTE — Plan of Care (Signed)

## 2017-10-03 NOTE — Progress Notes (Signed)
   Subjective:  Patient reports pain as mild.  No events.  Objective:   VITALS:   Vitals:   10/02/17 1811 10/02/17 1835 10/02/17 2021 10/03/17 0423  BP: 110/64 112/69 (!) 104/59 124/72  Pulse: 68 70 86 94  Resp: 18 16    Temp:  (!) 97.4 F (36.3 C) 97.7 F (36.5 C) 98.7 F (37.1 C)  TempSrc:  Oral Oral Oral  SpO2: 100% 100% 99% 99%  Weight:      Height:        Dorsiflexion/Plantar flexion intact Incision: dressing C/D/I and no drainage   Lab Results  Component Value Date   WBC 7.8 10/03/2017   HGB 14.3 10/03/2017   HCT 42.1 10/03/2017   MCV 93.8 10/03/2017   PLT 200 10/03/2017     Assessment/Plan:  1 Day Post-Op   - Expected postop acute blood loss anemia - will monitor for symptoms - Up with PT/OT - DVT ppx - SCDs, ambulation, IV heparin can be resumed 24 hrs after surgery - NWB operative extremity - Pain controlled - needs 2 weeks of doxycycline post discharge - f/u 1 week in office for wound check, surgical dressing should be not changed until f/u appt  Eduard Roux 10/03/2017, 8:48 AM [phone]

## 2017-10-04 LAB — GLUCOSE, CAPILLARY
GLUCOSE-CAPILLARY: 167 mg/dL — AB (ref 70–99)
GLUCOSE-CAPILLARY: 213 mg/dL — AB (ref 70–99)
Glucose-Capillary: 109 mg/dL — ABNORMAL HIGH (ref 70–99)
Glucose-Capillary: 126 mg/dL — ABNORMAL HIGH (ref 70–99)
Glucose-Capillary: 190 mg/dL — ABNORMAL HIGH (ref 70–99)

## 2017-10-04 LAB — CBC
HCT: 41.3 % (ref 39.0–52.0)
Hemoglobin: 14.3 g/dL (ref 13.0–17.0)
MCH: 32.7 pg (ref 26.0–34.0)
MCHC: 34.6 g/dL (ref 30.0–36.0)
MCV: 94.5 fL (ref 78.0–100.0)
PLATELETS: 209 10*3/uL (ref 150–400)
RBC: 4.37 MIL/uL (ref 4.22–5.81)
RDW: 13 % (ref 11.5–15.5)
WBC: 9.1 10*3/uL (ref 4.0–10.5)

## 2017-10-04 LAB — HEPARIN LEVEL (UNFRACTIONATED)
HEPARIN UNFRACTIONATED: 0.14 [IU]/mL — AB (ref 0.30–0.70)
Heparin Unfractionated: 0.17 IU/mL — ABNORMAL LOW (ref 0.30–0.70)

## 2017-10-04 LAB — BASIC METABOLIC PANEL
Anion gap: 10 (ref 5–15)
BUN: 15 mg/dL (ref 8–23)
CALCIUM: 8.9 mg/dL (ref 8.9–10.3)
CO2: 25 mmol/L (ref 22–32)
CREATININE: 1.48 mg/dL — AB (ref 0.61–1.24)
Chloride: 102 mmol/L (ref 98–111)
GFR calc Af Amer: 56 mL/min — ABNORMAL LOW (ref >60)
GFR, EST NON AFRICAN AMERICAN: 48 mL/min — AB (ref >60)
GLUCOSE: 127 mg/dL — AB (ref 70–99)
POTASSIUM: 4.2 mmol/L (ref 3.5–5.1)
SODIUM: 137 mmol/L (ref 135–145)

## 2017-10-04 MED ORDER — RIVAROXABAN 20 MG PO TABS
20.0000 mg | ORAL_TABLET | Freq: Every day | ORAL | Status: DC
  Administered 2017-10-04 – 2017-10-05 (×2): 20 mg via ORAL
  Filled 2017-10-04 (×2): qty 1

## 2017-10-04 MED ORDER — AMOXICILLIN-POT CLAVULANATE 500-125 MG PO TABS
1.0000 | ORAL_TABLET | Freq: Two times a day (BID) | ORAL | Status: DC
  Administered 2017-10-04 – 2017-10-05 (×3): 500 mg via ORAL
  Filled 2017-10-04 (×4): qty 1

## 2017-10-04 NOTE — Progress Notes (Signed)
[place] for Infectious Disease       ID: Daniel [name] is a 62 y.o. male with  DfU/osteo s/p TM amputation Principal Problem:   Diabetic foot infection ([place]) Active Problems:   Human immunodeficiency virus (HIV) disease ([place])   Insulin-requiring or dependent type II diabetes mellitus ([place])   Chronic renal insufficiency, stage III (moderate) ([name])   OSA on CPAP   Major depressive disorder, recurrent episode, moderate ([name])   History of DVT (deep vein thrombosis)   Osteomyelitis ([name])   Hypothyroidism   Constipation    Subjective: Afebrile, eye feeling better   Medications:  . abacavir-dolutegravir-lamiVUDine  1 tablet Oral QHS  . amoxicillin-clavulanate  1 tablet Oral BID  . amphetamine-dextroamphetamine  30 mg Oral TID  . artificial tears   Right Eye Q8H  . Brexpiprazole  1 mg Oral QHS  . diclofenac sodium  2 g Topical BID  . divalproex  500 mg Oral QHS  . doxycycline  100 mg Oral Q12H  . feeding supplement (PRO-STAT SUGAR FREE 64)  30 mL Oral BID  . insulin aspart  0-15 Units Subcutaneous Q4H  . insulin aspart  10 Units Subcutaneous QAC supper  . insulin aspart  10 Units Subcutaneous Q breakfast  . insulin glargine  75 Units Subcutaneous Daily  . ketotifen  1 drop Both Eyes BID  . levothyroxine  50 mcg Oral QHS  . pantoprazole  40 mg Oral Q0600  . polyethylene glycol  17 g Oral BID  . pregabalin  100 mg Oral TID  . protriptyline  10 mg Oral TID  . rivaroxaban  20 mg Oral Daily  . senna-docusate  1 tablet Oral BID  . vortioxetine HBr  20 mg Oral QHS  . zolpidem  10 mg Oral QHS    Objective: Vital signs in last 24 hours: Temp:  [97.8 F (36.6 C)-98.8 F (37.1 C)] 98.6 F (37 C) (08/11 1307) Pulse Rate:  [85-93] 93 (08/11 1307) Resp:  [16] 16 (08/11 1307) BP: (113-122)/(64-71) 116/64 (08/11 1307) SpO2:  [97 %-98 %] 98 % (08/11 1307) Physical Exam  Constitutional: He is oriented to person, place, and time. He appears well-developed and  well-nourished. No distress.  HENT:  Mouth/Throat: Oropharynx is clear and moist. No oropharyngeal exudate.  Cardiovascular: Normal rate, regular rhythm and normal heart sounds. Exam reveals no gallop and no friction rub.  No murmur heard.  Pulmonary/Chest: Effort normal and breath sounds normal. No respiratory distress. He has no wheezes.  Abdominal: Soft. Bowel sounds are normal. He exhibits no distension. There is no tenderness.  Ext: right foot is wrapped from surgery Neurological: He is alert and oriented to person, place, and time.  Skin: Skin is warm and dry. No rash noted. No erythema.  Psychiatric: He has a normal mood and affect. His behavior is normal.     Lab Results Recent Labs    10/03/17 0431 10/04/17 0047  WBC 7.8 9.1  HGB 14.3 14.3  HCT 42.1 41.3  NA 138 137  K 4.5 4.2  CL 104 102  CO2 26 25  BUN 19 15  CREATININE 1.54* 1.48*   Lab Results  Component Value Date   ESRSEDRATE 90 (H) 09/30/2017    Microbiology: reviewed Studies/Results: No results found.   Assessment/Plan: DFU/osteo = on 2 wk of mop-up with doxycycline 130m bid plus augmentin. Defer wound care to dr xErlinda Hong hiv disease= continue on triumeq, will check cd 4 count and viral load for upcoming  visit  Corneal abrasion? = has improvement with drops and gel.  Will sign off. We will see him in clinic in 2-4 wk  [facility] for Infectious Diseases Cell: [phone] Pager: [phone]  10/04/2017, 7:21 PM

## 2017-10-04 NOTE — Progress Notes (Deleted)
ANTICOAGULATION CONSULT NOTE - Follow Up Consult  Pharmacy Consult for Heparin Indication: DVT  Allergies  Allergen Reactions  . Aspirin Swelling  . Ibuprofen Swelling  . Sustiva [Efavirenz] Swelling and Rash  . Nsaids Other (See Comments)    unknwn    Patient Measurements: Height: 5' 11"  (180.3 cm) Weight: 253 lb 8.5 oz (115 kg) IBW/kg (Calculated) : 75.3 Heparin Dosing Weight: 100.4  Vital Signs: Temp: 97.8 F (36.6 C) (08/11 0530) Temp Source: Oral (08/11 0530) BP: 113/66 (08/11 0530) Pulse Rate: 85 (08/11 0530)  Labs: Recent Labs    10/02/17 0422 10/03/17 0431 10/04/17 0047 10/04/17 0727  HGB 13.8 14.3 14.3  --   HCT 39.7 42.1 41.3  --   PLT 158 200 209  --   HEPARINUNFRC 0.19* <0.10* 0.14* 0.17*  CREATININE 1.48* 1.54* 1.48*  --     Estimated Creatinine Clearance: 65 mL/min (A) (by C-G formula based on SCr of 1.48 mg/dL (H)).   Medications:  Scheduled:  . abacavir-dolutegravir-lamiVUDine  1 tablet Oral QHS  . amphetamine-dextroamphetamine  30 mg Oral TID  . artificial tears   Right Eye Q8H  . Brexpiprazole  1 mg Oral QHS  . diclofenac sodium  2 g Topical BID  . divalproex  500 mg Oral QHS  . doxycycline  100 mg Oral Q12H  . feeding supplement (PRO-STAT SUGAR FREE 64)  30 mL Oral BID  . insulin aspart  0-15 Units Subcutaneous Q4H  . insulin aspart  10 Units Subcutaneous QAC supper  . insulin aspart  10 Units Subcutaneous Q breakfast  . insulin glargine  75 Units Subcutaneous Daily  . ketotifen  1 drop Both Eyes BID  . levothyroxine  50 mcg Oral QHS  . pantoprazole  40 mg Oral Q0600  . polyethylene glycol  17 g Oral BID  . pregabalin  100 mg Oral TID  . protriptyline  10 mg Oral TID  . senna-docusate  1 tablet Oral BID  . vortioxetine HBr  20 mg Oral QHS  . zolpidem  10 mg Oral QHS    Assessment: 62 y/o male admitted for diabetic foot infection, history of DVT on xarleto PTA. Status post 48 hours transmetatarsal amputation. Pharmacy consulted to  restart heparin post-op. Currently subtherapeutic on heparin. Last dose of xarelto on 8/6.  H&H wnl.   Goal of Therapy:  Heparin level 0.3-0.7 units/ml Monitor platelets by anticoagulation protocol: Yes   Plan:  Increase heparin infusion to 2500 units/hr Check anti-Xa level in 6 hours and daily while on heparin Continue to monitor H&H and platelets  Thank you for allowing pharmacy to be a part of this patient's care.  Tamela Gammon, PharmD PGY1 Pharmacy Resident Direct Phone: [phone] 10/04/2017 8:13 AM

## 2017-10-04 NOTE — Progress Notes (Signed)
  ANTICOAGULATION CONSULT NOTE - Initial Consult  Pharmacy Consult for Xarelto Indication: DVT  Allergies  Allergen Reactions  . Aspirin Swelling  . Ibuprofen Swelling  . Sustiva [Efavirenz] Swelling and Rash  . Nsaids Other (See Comments)    unknwn    Patient Measurements: Height: 5' 11"  (180.3 cm) Weight: 253 lb 8.5 oz (115 kg) IBW/kg (Calculated) : 75.3  Vital Signs: Temp: 97.8 F (36.6 C) (08/11 0530) Temp Source: Oral (08/11 0530) BP: 113/66 (08/11 0530) Pulse Rate: 85 (08/11 0530)  Labs: Recent Labs    10/02/17 0422 10/03/17 0431 10/04/17 0047 10/04/17 0727  HGB 13.8 14.3 14.3  --   HCT 39.7 42.1 41.3  --   PLT 158 200 209  --   HEPARINUNFRC 0.19* <0.10* 0.14* 0.17*  CREATININE 1.48* 1.54* 1.48*  --     Estimated Creatinine Clearance: 65 mL/min (A) (by C-G formula based on SCr of 1.48 mg/dL (H)).   Medical History: Past Medical History:  Diagnosis Date  . ADHD (attention deficit hyperactivity disorder)   . Anxiety   . Chronic kidney disease   . Clotting disorder ([place])   . Depression   . Diabetes mellitus without complication ([place])   . Diabetes mellitus, type II ([place])   . Dizziness 03/17/2015  . GERD (gastroesophageal reflux disease)   . HIV disease ([place])   . HIV infection ([place])   . Liver disease   . OSA (obstructive sleep apnea) 07/25/2015   Uses CPAP regularly  . Peripheral vascular disease ([place])   . Ulcer     Medications:  Scheduled:  . abacavir-dolutegravir-lamiVUDine  1 tablet Oral QHS  . amphetamine-dextroamphetamine  30 mg Oral TID  . artificial tears   Right Eye Q8H  . Brexpiprazole  1 mg Oral QHS  . diclofenac sodium  2 g Topical BID  . divalproex  500 mg Oral QHS  . doxycycline  100 mg Oral Q12H  . feeding supplement (PRO-STAT SUGAR FREE 64)  30 mL Oral BID  . insulin aspart  0-15 Units Subcutaneous Q4H  . insulin aspart  10 Units Subcutaneous QAC supper  . insulin aspart  10 Units Subcutaneous Q breakfast  . insulin glargine   75 Units Subcutaneous Daily  . ketotifen  1 drop Both Eyes BID  . levothyroxine  50 mcg Oral QHS  . pantoprazole  40 mg Oral Q0600  . polyethylene glycol  17 g Oral BID  . pregabalin  100 mg Oral TID  . protriptyline  10 mg Oral TID  . senna-docusate  1 tablet Oral BID  . vortioxetine HBr  20 mg Oral QHS  . zolpidem  10 mg Oral QHS    Assessment: 62 y/o male admitted for diabetic foot infection, history of DVT on xarleto anticoagulation PTA. Status post 48 hours transmetatarsal amputation. Pharmacy consulted to restart xarelto post-op. Patient was subtherapeutic on heparin. Last dose of xarelto  on 8/6.  H&H wnl.    Goal of Therapy:  Anticoagulation Monitor platelets by anticoagulation protocol: Yes   Plan:  STOP heparin infusion. Start Xarelto 64m PO once daily Monitor signs & symptoms of bleeding, daily CBC.   Thank you for allowing pharmacy to be a part of this patient's care.  JTamela Gammon PharmD PGY1 Pharmacy Resident Direct Phone: [phone]/12/2017 9:00 AM

## 2017-10-04 NOTE — Progress Notes (Signed)
Patient stated he would place himself on CPAP when he is ready for bed. RT informed patient to have RT called if assistance is needed. RT will monitor as needed.

## 2017-10-04 NOTE — Progress Notes (Signed)
PROGRESS NOTE    [facility]  XBM:[id number] DOB: [birth year] DOA: 09/30/2017 PCP: [name], MD   Brief Narrative:  Patient 63 year old gentleman with history of HIV, last CD4 count of 590 and viral load undetectable March 2019, history of depression with anxiety, insomnia, obstructive sleep apnea, chronic kidney disease stage III, insulin-dependent diabetes mellitus, history of DVT on Xarelto, hypothyroidism presenting to the ED with increased swelling drainage of his second toe on the right foot.  Work-up done consistent with diabetic foot infection with dislocation of second MTP and possible osteomyelitis.  Patient placed empirically on IV antibiotics.  Orthopedics consulted.  ID consulted.   Assessment & Plan:   Principal Problem:   Diabetic foot infection ([place]) Active Problems:   Human immunodeficiency virus (HIV) disease ([place])   Insulin-requiring or dependent type II diabetes mellitus ([place])   Chronic renal insufficiency, stage III (moderate) ([name])   OSA on CPAP   Major depressive disorder, recurrent episode, moderate ([name])   History of DVT (deep vein thrombosis)   Osteomyelitis ([name])   Hypothyroidism   Constipation  #1 diabetic foot infection/right foot cellulitis with osteomyelitis Patient presented with increased swelling and drainage second right toe.  Plain films revealled dislocation of second MTP and concern for possible osteomyelitis.  Blood cultures have been obtained and are pending.  CRP elevated at 24.9.  Sed rate at 90.  Patient seen in consultation by orthopedic surgery who were concerned about osteomyelitis and feel patient would likely need an amputation.  Patient has been seen by orthopedics and patient s/p transmetatarsal amputation on 10/02/2017.  Patient has also been seen in consultation by ID.  Patient initially on IV vancomycin, IV Rocephin and IV Flagyl.  IV vancomycin, IV Rocephin IV Flagyl have been discontinued and patient has now been started on  oral doxycycline as well as Augmentin.  Orthopedics recommending 2 weeks of oral doxycycline postoperatively.  Patient will likely be discharged on 2 weeks worth of oral doxycycline and Augmentin per ID recommendations.  Outpatient follow-up with orthopedics and ID.   2.  HIV disease Well-controlled.  Viral load undetectable in March.  CD4 count of 590.  Continue current regimen of triumeq.  ID following.  3.  Diabetes mellitus type 2, insulin-dependent Hemoglobin A1c was 7.6 on 09/21/2017.  Patient was initially made n.p.o. the night of 10/01/2107 and as such was only given half his home dose of Lantus.  Patient was placed back on home dose of Lantus 150 units daily on 10/01/2017.  Patient's his dose was decreased to 75 units daily in anticipation of surgery on 10/02/2017.  Patient had been placed back on oral diet.  CBGs ranging from 109-213.  Continue current dose of Lantus 75 units daily and meal coverage NovoLog 10 units twice daily.  Continue sliding scale insulin.  Outpatient follow-up with endocrinologist.    4.  History of DVT Xarelto was held in anticipation of possible surgery.  IV heparin was held perioperatively.  IV heparin was resumed on 10/03/2017.  Patient with no bleeding.  Resume home regimen Xarelto and discontinue heparin today.    5.  Chronic kidney disease stage III Renal function seems stable.  IV vancomycin has been discontinued.  6.  Hypokalemia Magnesium level at 2.0.  Potassium repleted.  Follow.    7.  Hypothyroidism Continue Synthroid.   8.  Obstructive sleep apnea CPAP nightly.  9.  Anxiety/depression/insomnia Stable.  Continue Depakote and Trintellix.    10.  Constipation Noted on abdominal films.  Continue bowel regimen of MiraLAX twice daily and Senokot as twice daily.  11.  History of Bell's palsy /right eye discomfort Patient with complaints of right eye discomfort (10/03/2017) with history of Bell's palsy.  Patient states was supposed to have a patch on his  right eye however his dog ate 3 office patches.  Patient states was supposed to follow-up with an ophthalmologist in the outpatient setting and as such we will follow-up with him on discharge hopefully early next week. Patient started on Lacri-Lube with clinical improvement.  Outpatient follow-up.   DVT prophylaxis: Heparin Code Status: Full Family Communication: Updated patient.  No family at bedside. Disposition Plan: SNF versus home with home health when okay with orthopedics and ID.   Consultants:   Orthopedics: Hilbert Odor, PA 10/01/2017  Infectious disease: [name] 10/01/2017  Procedures:  CT head CT C-spine 09/28/2017  CT chest 09/28/2017  CT abdomen and pelvis 09/28/2017  Plain films of the right foot 09/30/2017  Trans-metatarsal amputation 10/02/2017--Per Dr Erlinda Hong  Antimicrobials:   IV Rocephin 10/01/2017>>>>> 10/04/2017  IV Flagyl 10/01/2017>>>>> 10/04/2017  IV vancomycin 09/30/2017>>>> 10/03/2017  Augmentin 10/04/2017>>>>  Oral doxycycline 10/02/2017   Subjective: Denies any chest pain.  No shortness of breath.  I feels better after being started on Lacri-Lube.  No bleeding.     Objective: Vitals:   10/03/17 1327 10/03/17 1950 10/04/17 0530 10/04/17 1307  BP: 122/64 122/71 113/66 116/64  Pulse: 94 92 85 93  Resp: 16   16  Temp: 98.9 F (37.2 C) 98.8 F (37.1 C) 97.8 F (36.6 C) 98.6 F (37 C)  TempSrc: Oral Oral Oral Oral  SpO2: 95% 98% 97% 98%  Weight:      Height:        Intake/Output Summary (Last 24 hours) at 10/04/2017 1415 Last data filed at 10/04/2017 0900 Gross per 24 hour  Intake 1838.83 ml  Output 1250 ml  Net 588.83 ml   Filed Weights   09/30/17 1756 10/02/17 1403  Weight: 115.7 kg 115 kg    Examination:  General exam: NAD Respiratory system: CTAB.  No wheezes, no crackles, no rhonchi.  Cardiovascular system: Regular rate rhythm no murmurs rubs or gallops.  No JVD.  No lower extremity edema. Gastrointestinal system: Abdomen is soft,  nontender, nondistended, positive bowel sounds.  No hepatosplenomegaly.    Central nervous system: Alert and oriented. No focal neurological deficits. Extremities: Right lower extremity wrapped in bandage status post transmetatarsal amputation.  Skin: No rashes, lesions or ulcers Psychiatry: Judgement and insight appear normal. Mood & affect appropriate.     Data Reviewed: I have personally reviewed following labs and imaging studies  CBC: Recent Labs  Lab 09/28/17 1344  09/30/17 1816 10/01/17 0441 10/02/17 0422 10/03/17 0431 10/04/17 0047  WBC 10.7*  --  8.6 6.3 5.7 7.8 9.1  NEUTROABS 8.0*  --  5.1 3.1  --  4.6  --   HGB 14.6   < > 14.7 13.7 13.8 14.3 14.3  HCT 42.0   < > 41.7 38.6* 39.7 42.1 41.3  MCV 96.1  --  95.0 93.7 94.7 93.8 94.5  PLT 108*  --  172 156 158 200 209   < > = values in this interval not displayed.   Basic Metabolic Panel: Recent Labs  Lab 09/30/17 1816 10/01/17 0441 10/01/17 1134 10/02/17 0422 10/03/17 0431 10/04/17 0047  NA 134* 133*  --  136 138 137  K 3.8 3.4*  --  3.6 4.5 4.2  CL 103  102  --  101 104 102  CO2 23 23  --  24 26 25   GLUCOSE 182* 172*  --  120* 135* 127*  BUN 25* 22  --  21 19 15   CREATININE 1.39* 1.34*  --  1.48* 1.54* 1.48*  CALCIUM 8.9 8.4*  --  8.8* 8.8* 8.9  MG  --   --  2.0  --   --   --    GFR: Estimated Creatinine Clearance: 65 mL/min (A) (by C-G formula based on SCr of 1.48 mg/dL (H)). Liver Function Tests: Recent Labs  Lab 09/28/17 1344 09/30/17 1816  AST 22 30  ALT 26 32  ALKPHOS 77 83  BILITOT 1.3* 1.1  PROT 6.6 7.5  ALBUMIN 3.1* 3.3*   No results for input(s): LIPASE, AMYLASE in the last 168 hours. No results for input(s): AMMONIA in the last 168 hours. Coagulation Profile: No results for input(s): INR, PROTIME in the last 168 hours. Cardiac Enzymes: No results for input(s): CKTOTAL, CKMB, CKMBINDEX, TROPONINI in the last 168 hours. BNP (last 3 results) Recent Labs    12/11/16 1853  PROBNP 31    HbA1C: No results for input(s): HGBA1C in the last 72 hours. CBG: Recent Labs  Lab 10/03/17 1600 10/03/17 1953 10/03/17 2322 10/04/17 0756 10/04/17 1140  GLUCAP 155* 117* 112* 109* 213*   Lipid Profile: No results for input(s): CHOL, HDL, LDLCALC, TRIG, CHOLHDL, LDLDIRECT in the last 72 hours. Thyroid Function Tests: No results for input(s): TSH, T4TOTAL, FREET4, T3FREE, THYROIDAB in the last 72 hours. Anemia Panel: No results for input(s): VITAMINB12, FOLATE, FERRITIN, TIBC, IRON, RETICCTPCT in the last 72 hours. Sepsis Labs: No results for input(s): PROCALCITON, LATICACIDVEN in the last 168 hours.  Recent Results (from the past 240 hour(s))  Blood culture (routine x 2)     Status: None (Preliminary result)   Collection Time: 09/30/17  6:12 PM  Result Value Ref Range Status   Specimen Description BLOOD RIGHT ARM  Final   Special Requests   Final    BOTTLES DRAWN AEROBIC AND ANAEROBIC Blood Culture adequate volume   Culture   Final    NO GROWTH 3 DAYS Performed at [facility], [address], [place], [place] [address]    Report Status PENDING  Incomplete  Blood culture (routine x 2)     Status: None (Preliminary result)   Collection Time: 09/30/17  9:34 PM  Result Value Ref Range Status   Specimen Description RIGHT ANTECUBITAL  Final   Special Requests   Final    BOTTLES DRAWN AEROBIC AND ANAEROBIC Blood Culture adequate volume   Culture   Final    NO GROWTH 3 DAYS Performed at [facility], [address], [place], [name] [address]    Report Status PENDING  Incomplete  MRSA PCR Screening     Status: None   Collection Time: 10/01/17 11:56 AM  Result Value Ref Range Status   MRSA by PCR NEGATIVE NEGATIVE Final    Comment:        The GeneXpert MRSA Assay (FDA approved for NASAL specimens only), is one component of a comprehensive MRSA colonization surveillance program. It is not intended to diagnose MRSA infection nor to guide or monitor treatment for MRSA  infections. Performed at [facility], [place] [address] [place], [place] [address]          Radiology Studies: No results found.      Scheduled Meds: . abacavir-dolutegravir-lamiVUDine  1 tablet Oral QHS  .  amoxicillin-clavulanate  1 tablet Oral BID  . amphetamine-dextroamphetamine  30 mg Oral TID  . artificial tears   Right Eye Q8H  . Brexpiprazole  1 mg Oral QHS  . diclofenac sodium  2 g Topical BID  . divalproex  500 mg Oral QHS  . doxycycline  100 mg Oral Q12H  . feeding supplement (PRO-STAT SUGAR FREE 64)  30 mL Oral BID  . insulin aspart  0-15 Units Subcutaneous Q4H  . insulin aspart  10 Units Subcutaneous QAC supper  . insulin aspart  10 Units Subcutaneous Q breakfast  . insulin glargine  75 Units Subcutaneous Daily  . ketotifen  1 drop Both Eyes BID  . levothyroxine  50 mcg Oral QHS  . pantoprazole  40 mg Oral Q0600  . polyethylene glycol  17 g Oral BID  . pregabalin  100 mg Oral TID  . protriptyline  10 mg Oral TID  . rivaroxaban  20 mg Oral Daily  . senna-docusate  1 tablet Oral BID  . vortioxetine HBr  20 mg Oral QHS  . zolpidem  10 mg Oral QHS   Continuous Infusions: . lactated ringers Stopped (10/02/17 1904)     LOS: 4 days    Time spent: 35 minutes    Irine Seal, MD [facility] Pager [phone]  If 7PM-7AM, please contact night-coverage [email or web address] Password [email or web address] 10/04/2017, 2:15 PM

## 2017-10-04 NOTE — Progress Notes (Signed)
ANTICOAGULATION CONSULT NOTE - Follow Up Consult  Pharmacy Consult for heparin Indication: DVT  Labs: Recent Labs    10/02/17 0422 10/03/17 0431 10/04/17 0047  HGB 13.8 14.3 14.3  HCT 39.7 42.1 41.3  PLT 158 200 209  HEPARINUNFRC 0.19* <0.10* 0.14*  CREATININE 1.48* 1.54* 1.48*    Assessment: 62yo male subtherapeutic on heparin after resumed post-op; most likely prior therapeutic levels were due to PTA DOAC which has now cleared.  Goal of Therapy:  Heparin level 0.3-0.7 units/ml   Plan:  Will increase heparin gtt by 3 unitsABW/kg/hr to 2200 units/hr and check level in 6 hours.    Wynona Neat, PharmD, BCPS  10/04/2017,1:40 AM

## 2017-10-05 DIAGNOSIS — M86471 Chronic osteomyelitis with draining sinus, right ankle and foot: Secondary | ICD-10-CM

## 2017-10-05 DIAGNOSIS — I824Y9 Acute embolism and thrombosis of unspecified deep veins of unspecified proximal lower extremity: Secondary | ICD-10-CM

## 2017-10-05 DIAGNOSIS — I82409 Acute embolism and thrombosis of unspecified deep veins of unspecified lower extremity: Secondary | ICD-10-CM

## 2017-10-05 LAB — CBC
HEMATOCRIT: 40.1 % (ref 39.0–52.0)
Hemoglobin: 13.7 g/dL (ref 13.0–17.0)
MCH: 32.2 pg (ref 26.0–34.0)
MCHC: 34.2 g/dL (ref 30.0–36.0)
MCV: 94.1 fL (ref 78.0–100.0)
Platelets: 189 10*3/uL (ref 150–400)
RBC: 4.26 MIL/uL (ref 4.22–5.81)
RDW: 12.9 % (ref 11.5–15.5)
WBC: 8.8 10*3/uL (ref 4.0–10.5)

## 2017-10-05 LAB — CULTURE, BLOOD (ROUTINE X 2)
CULTURE: NO GROWTH
Culture: NO GROWTH
Special Requests: ADEQUATE
Special Requests: ADEQUATE

## 2017-10-05 LAB — BASIC METABOLIC PANEL
Anion gap: 9 (ref 5–15)
BUN: 16 mg/dL (ref 8–23)
CO2: 22 mmol/L (ref 22–32)
CREATININE: 1.45 mg/dL — AB (ref 0.61–1.24)
Calcium: 8.6 mg/dL — ABNORMAL LOW (ref 8.9–10.3)
Chloride: 106 mmol/L (ref 98–111)
GFR calc non Af Amer: 49 mL/min — ABNORMAL LOW (ref >60)
GFR, EST AFRICAN AMERICAN: 57 mL/min — AB (ref >60)
GLUCOSE: 130 mg/dL — AB (ref 70–99)
Potassium: 4 mmol/L (ref 3.5–5.1)
Sodium: 137 mmol/L (ref 135–145)

## 2017-10-05 LAB — GLUCOSE, CAPILLARY
GLUCOSE-CAPILLARY: 162 mg/dL — AB (ref 70–99)
GLUCOSE-CAPILLARY: 144 mg/dL — AB (ref 70–99)
Glucose-Capillary: 131 mg/dL — ABNORMAL HIGH (ref 70–99)

## 2017-10-05 LAB — T-HELPER CELLS (CD4) COUNT (NOT AT ARMC)
CD4 % Helper T Cell: 38 % (ref 33–55)
CD4 T Cell Abs: 670 /uL (ref 400–2700)

## 2017-10-05 MED ORDER — POLYETHYLENE GLYCOL 3350 17 G PO PACK: 17.0000 g | PACK | Freq: Two times a day (BID) | ORAL | 0 refills | Status: DC

## 2017-10-05 MED ORDER — PANTOPRAZOLE SODIUM 40 MG PO TBEC: 40.0000 mg | DELAYED_RELEASE_TABLET | Freq: Every day | ORAL | 0 refills | Status: DC

## 2017-10-05 MED ORDER — SENNOSIDES-DOCUSATE SODIUM 8.6-50 MG PO TABS: 1.0000 | ORAL_TABLET | Freq: Two times a day (BID) | ORAL | Status: DC

## 2017-10-05 MED ORDER — ARTIFICIAL TEARS OPHTHALMIC OINT: TOPICAL_OINTMENT | Freq: Three times a day (TID) | OPHTHALMIC | 0 refills | Status: AC

## 2017-10-05 MED ORDER — AMOXICILLIN-POT CLAVULANATE 500-125 MG PO TABS: 1.0000 | ORAL_TABLET | Freq: Two times a day (BID) | ORAL | 0 refills | Status: AC

## 2017-10-05 MED ORDER — DOXYCYCLINE HYCLATE 100 MG PO TABS: 100.0000 mg | ORAL_TABLET | Freq: Two times a day (BID) | ORAL | 0 refills | Status: AC

## 2017-10-05 MED ORDER — HYDROCODONE-ACETAMINOPHEN 5-325 MG PO TABS: 1.0000 | ORAL_TABLET | ORAL | 0 refills | Status: DC | PRN

## 2017-10-05 MED ORDER — RIVAROXABAN 20 MG PO TABS: 20.0000 mg | ORAL_TABLET | Freq: Every day | ORAL | 1 refills | Status: DC

## 2017-10-05 MED ORDER — KETOTIFEN FUMARATE 0.025 % OP SOLN: 1.0000 [drp] | Freq: Two times a day (BID) | OPHTHALMIC | 0 refills | Status: AC

## 2017-10-05 NOTE — Care Management Note (Addendum)
Case Management Note  Patient Details  Name: KEIDEN DESKIN MRN: [id number] Date of Birth: [birth year]  Subjective/Objective:    Diabetic foot infection with cellulitis s/p transmetatarsal amputation 10/02/17 , HIV, DM,                 Action/Plan: NCM spoke to pt and declined light weight manual wheelchair at this time to reserve his benefit for getting a scooter in the future. Explained he can rent wheelchair. Pt purchased a knee scooter via [employer] to be delivered to home on Wednesday. He has RW at home and shower bench. Contacted [facility] for 3n1 bedside commode for home.   Expected Discharge Date:  10/05/17               Expected Discharge Plan:  [place]  In-House Referral:  NA  Discharge planning Services  CM Consult  Post Acute Care Choice:  Home Health Choice offered to:  Patient  DME Arranged:  N/A DME Agency:  NA  HH Arranged:  PT, OT, Nurse's Aide [place] Agency:  [facility]  Status of Service:  Completed, signed off  If discussed at [place] of Stay Meetings, dates discussed:    Additional Comments:  Erenest Rasher, RN 10/05/2017, 3:17 PM

## 2017-10-05 NOTE — Evaluation (Signed)
Occupational Therapy Evaluation Patient Details Name: Daniel [name] MRN: [id number] DOB: Oct 13, 1955 Today's Date: 10/05/2017    History of Present Illness 62 y.o. male s/p transmetatarsal amputation on R LE. PMH HIV, CKD stage 3, DM, and history of ray amputation on R LE.    Clinical Impression   Pt with decline in function and safety with ADLs and ADL mobility with decreased strength, balance and endurance. Pt would benefit from acute OT services to address impairments to maximize level of function and safety    Follow Up Recommendations  Home health OT    Equipment Recommendations  3 in 1 bedside commode;Tub/shower bench;Other (comment)(reacher)    Recommendations for Other Services       Precautions / Restrictions Precautions Precautions: Fall Restrictions Weight Bearing Restrictions: Yes RLE Weight Bearing: Non weight bearing      Mobility Bed Mobility Overal bed mobility: Needs Assistance Bed Mobility: Supine to Sit     Supine to sit: Min guard     General bed mobility comments: pt up in recliner uopn arrival. Per PT note pt is min guard A  Transfers Overall transfer level: Needs assistance Equipment used: Rolling walker (2 wheeled) Transfers: Sit to/from [facility] Sit to Stand: Min assist Stand pivot transfers: Min assist       General transfer comment: Min A for steadying RW, and min cues for hand placement, safety and maintence of NWM precautions    Balance Overall balance assessment: Needs assistance Sitting-balance support: No upper extremity supported Sitting balance-Leahy Scale: Good     Standing balance support: Bilateral upper extremity supported;During functional activity Standing balance-Leahy Scale: Poor Standing balance comment: Pt required bil UE support with dynamic stance activity to maintain NWB precautions                            ADL either performed or assessed with clinical judgement   ADL  Overall ADL's : Needs assistance/impaired Eating/Feeding: Independent;Sitting   Grooming: Wash/dry hands;Wash/dry face;Standing;Minimal assistance Grooming Details (indicate cue type and reason): min A for steadying Upper Body Bathing: Set up;Sitting   Lower Body Bathing: Moderate assistance   Upper Body Dressing : Set up;Sitting   Lower Body Dressing: Moderate assistance   Toilet Transfer: Minimal assistance;Cueing for safety;RW;BSC   Toileting- Clothing Manipulation and Hygiene: Minimal assistance;Sit to/from stand;Sitting/lateral lean     Tub/Shower Transfer Details (indicate cue type and reason): pt has tub shower at home with tub/shower seat and reports that he will not have enough space to use a 3 in 1 or tub bench Functional mobility during ADLs: Minimal assistance;Rolling walker;Cueing for safety General ADL Comments: pt educated on use of tub bench, 3 in 1 and reacher for home use     Vision Baseline Vision/History: Wears glasses Patient Visual Report: No change from baseline(hx of Bells Palsy)       Perception     Praxis      Pertinent Vitals/Pain Pain Assessment: No/denies pain Pain Score: 0-No pain     Hand Dominance Right   Extremity/Trunk Assessment Upper Extremity Assessment Upper Extremity Assessment: Overall WFL for tasks assessed   Lower Extremity Assessment Lower Extremity Assessment: Defer to PT evaluation   Cervical / Trunk Assessment Cervical / Trunk Assessment: Normal   Communication Communication Communication: No difficulties   Cognition Arousal/Alertness: Awake/alert Behavior During Therapy: WFL for tasks assessed/performed Overall Cognitive Status: Within Functional Limits for tasks assessed  General Comments       Exercises     Shoulder Instructions      Home Living Family/patient expects to be discharged to:: Private residence Living Arrangements: Other  relatives Available Help at Discharge: Other (Comment)(reporst that his waunt's housekeeper/helper will assist him as well) Type of Home: House Home Access: Stairs to enter Entrance Stairs-Number of Steps: 1 Entrance Stairs-Rails: None Home Layout: Two level Alternate Level Stairs-Number of Steps: 16 Alternate Level Stairs-Rails: Can reach both Bathroom Shower/Tub: Teacher, early years/pre: Standard     Home Equipment: Environmental consultant - 2 wheels;Hand held shower head;Cane - single point;Shower seat   Additional Comments: Bathroom is located on first level, but pts bedroom is on second level. Able to sleep on couch or in recliner if has to. Aunt and brother live with pt, but his Elenor Legato is very demented and requires assitance herself. Is hopefull her aid/housemaid will also be able to assist him with needs at d/c. Brother lives with pt also but does not believe he will be able to assist.      Prior Functioning/Environment Level of Independence: Independent                 OT Problem List: Decreased strength;Decreased activity tolerance;Decreased knowledge of use of DME or AE;Impaired balance (sitting and/or standing);Pain      OT Treatment/Interventions: Self-care/ADL training;DME and/or AE instruction;Therapeutic activities;Patient/family education    OT Goals(Current goals can be found in the care plan section) Acute Rehab OT Goals Patient Stated Goal: "to go home" OT Goal Formulation: With patient Time For Goal Achievement: 10/19/17 Potential to Achieve Goals: Good  OT Frequency: Min 2X/week   Barriers to D/C:    no barriers       Co-evaluation              AM-PAC PT "6 Clicks" Daily Activity     Outcome Measure Help from another person eating meals?: None Help from another person taking care of personal grooming?: A Little Help from another person toileting, which includes using toliet, bedpan, or urinal?: A Little Help from another person bathing (including  washing, rinsing, drying)?: A Lot Help from another person to put on and taking off regular upper body clothing?: None Help from another person to put on and taking off regular lower body clothing?: A Lot 6 Click Score: 18   End of Session Equipment Utilized During Treatment: Gait belt;Rolling walker;Other (comment)(3 in 1, tub bench)  Activity Tolerance: Patient tolerated treatment well Patient left: in chair;with call bell/phone within reach  OT Visit Diagnosis: Unsteadiness on feet (R26.81);Other abnormalities of gait and mobility (R26.89);Pain;History of falling (Z91.81) Pain - Right/Left: Right Pain - part of body: Ankle and joints of foot                Time: 9379-0240 OT Time Calculation (min): 32 min Charges:  OT Evaluation $OT Eval Moderate Complexity: 1 Mod    Britt Bottom 10/05/2017, 1:21 PM

## 2017-10-05 NOTE — Discharge Summary (Signed)
Physician Discharge Summary  Daniel [name] BZJ:[id number] DOB: Mar 08, 1955 DOA: 09/30/2017  PCP: [name], MD  Admit date: 09/30/2017 Discharge date: 10/05/2017  Time spent: 60 minutes  Recommendations for Outpatient Follow-up:  1. Follow-up with Dr.Comer, as previously scheduled. 2. Follow-up with [name] in 1 week. 3. Follow-up with [name], MD in 2 to 3 weeks.  On follow-up patient will need a basic metabolic profile done to follow-up on electrolytes and renal function.  Patient's diabetes will also need to be reassessed at that time.   Discharge Diagnoses:  Principal Problem:   Diabetic foot infection ([place]) Active Problems:   Human immunodeficiency virus (HIV) disease ([place])   Insulin-requiring or dependent type II diabetes mellitus ([place])   Chronic renal insufficiency, stage III (moderate) ([name])   OSA on CPAP   Major depressive disorder, recurrent episode, moderate ([name])   History of DVT (deep vein thrombosis)   Osteomyelitis ([place])   Hypothyroidism   Constipation   Discharge Condition: Stable and improved  Diet recommendation: Carb modified  Filed Weights   09/30/17 1756 10/02/17 1403  Weight: 115.7 kg 115 kg    History of present illness:  Per Dr. Anselmo Rod is a 62 y.o. male with medical history significant for HIV (CD4 590 and VL undetectable in March '19), depression with anxiety and insomnia, chronic kidney disease stage III, insulin-dependent diabetes mellitus, history of DVT on Xarelto, hypothyroidism, and OSA, presented to the emergency department for evaluation of increasing swelling and drainage from his second toe on the right foot.  Patient has history of right first toe amputation and has been following with podiatry for ulcers on the right foot.  He fell on 09/26/2017 and was evaluated in the emergency department with negative imaging and eventually discharged home, but he was not complaining of any foot problems at that time.  Since then,  he has noticed increasing swelling and drainage from the second toe on the right foot.  He denied any pain, noting that he did not have sensation in his feet.  He had not appreciated any fevers or chills.  Denies chest pain, palpitations, or shortness of breath.  Had been on doxycycline for right foot ulcers, but completed that.  ED Course: Upon arrival to the ED, patient was found to be afebrile, saturating well on room air, and with vitals otherwise normal.  Chemistry panel was notable for slight hyponatremia and creatinine 1.39, similar to priors.  CBC was unremarkable.  Plain radiographs of the right foot demonstrated dislocation of the second MTP with possible osteomyelitis involving the second and third digits.  Blood cultures were collected in the ED and the patient was started on empiric Zosyn.  Orthopedic surgery was consulted and recommended a medical admission to [name].  Patient remained hemodynamically stable, in no apparent respiratory distress, and will be admitted to the medical-surgical unit at [name] for ongoing evaluation and management of foot infection in a diabetic with concern for underlying osteomyelitis.  [name] Course:  1 diabetic foot infection/right foot cellulitis with osteomyelitis Patient presented with increased swelling and drainage second right toe.  Plain films revealled dislocation of second MTP and concern for possible osteomyelitis.  Blood cultures have been obtained with no growth to date.  CRP elevated at 24.9.  Sed rate at 90.  Patient seen in consultation by orthopedic surgery who were concerned about osteomyelitis and feel patient would likely need an amputation.  Patient was seen by orthopedics and  patient s/p transmetatarsal amputation on 10/02/2017.  Patient has also been seen in consultation by ID.  Patient initially on IV vancomycin, IV Rocephin and IV Flagyl.  IV vancomycin, IV Rocephin IV Flagyl have been discontinued and patient has  now been started on oral doxycycline as well as Augmentin.  Orthopedics recommending 2 weeks of oral doxycycline postoperatively.  Patient will be discharged on 2 weeks worth of oral doxycycline and Augmentin per ID recommendations.  Outpatient follow-up with orthopedics and ID.   2.  HIV disease Well-controlled.  Viral load undetectable in March.  CD4 count of 590.  Continue on home regimen of triumeq.  Patient was seen by ID during the hospitalization and will follow up with ID as previously scheduled in the outpatient setting.   3.  Diabetes mellitus type 2, insulin-dependent Hemoglobin A1c was 7.6 on 09/21/2017.  Patient was initially made n.p.o. the night of 10/01/2107 and as such was only given half his home dose of Lantus.  Patient was placed back on home dose of Lantus 150 units daily on 10/01/2017.  Patient's Lantus dose was decreased to 75 units daily in anticipation of surgery on 10/02/2017.  Patient's Lantus was resumed at 75 units daily as well as NovoLog 10 units twice daily with meals and patient maintained on a sliding scale insulin.  Patient was discharged back on his home regimen of Lantus and NovoLog and is to follow-up with his endocrinologist in the outpatient setting.   4.  History of DVT Xarelto was held in anticipation of surgery.  IV heparin was held perioperatively.  IV heparin was resumed on 10/03/2017.  Patient with no bleeding.  Resumed home regimen Xarelto and discontinued heparin on 10/04/2017.  Patient's dose of Xarelto was increased to 20 mg daily per pharmacy recommendations.  Outpatient follow-up with PCP.    5.  Chronic kidney disease stage III Renal function remained stable throughout the hospitalization.  Outpatient follow-up.   6.  Hypokalemia Magnesium level at 2.0.  Potassium repleted.    7.  Hypothyroidism Patient maintained on his home regimen of Synthroid.   8.  Obstructive sleep apnea CPAP nightly.  9.  Anxiety/depression/insomnia Stable.  Patient  maintained on home regimen of Depakote and Trintellix.    10.  Constipation Noted on abdominal films.    Patient placed on the MiraLAX and Senokot bowel regimen.  Outpatient follow-up with PCP.  11.  History of Bell's palsy /right eye discomfort Patient with complaints of right eye discomfort (10/03/2017) with history of Bell's palsy.  Patient stated was supposed to have a patch on his right eye however his dog ate 3 of his patches.  Patient stated was supposed to follow-up with an ophthalmologist in the outpatient setting and as such will follow-up ophthalmologist on discharge. Patient started on Lacri-Lube and eyedrops with clinical improvement.  Outpatient follow-up.    Procedures:  CT head CT C-spine 09/28/2017  CT chest 09/28/2017  CT abdomen and pelvis 09/28/2017  Plain films of the right foot 09/30/2017  Trans-metatarsal amputation 10/02/2017--Per Dr Erlinda Hong  Consultations:  Orthopedics: Hilbert Odor, PA 10/01/2017  Infectious disease: [name] 10/01/2017  Discharge Exam: Vitals:   10/05/17 0528 10/05/17 1442  BP: 115/76 114/70  Pulse: 97 90  Resp:  16  Temp: 97.7 F (36.5 C) 98.4 F (36.9 C)  SpO2: 97% 98%    General: NAD Cardiovascular: RRR Respiratory: CTAB  Discharge Instructions   Discharge Instructions    Diet Carb Modified   Complete by:  As directed  Increase activity slowly   Complete by:  As directed      Allergies as of 10/05/2017      Reactions   Aspirin Swelling   Ibuprofen Swelling   Sustiva [efavirenz] Swelling, Rash   Nsaids Other (See Comments)   unknwn      Medication List    TAKE these medications   abacavir-dolutegravir-lamiVUDine 600-50-300 MG tablet Commonly known as:  TRIUMEQ Take 1 tablet by mouth daily. What changed:  when to take this   acetaminophen 500 MG tablet Commonly known as:  TYLENOL Take 1,000-1,500 mg by mouth every 8 (eight) hours as needed for moderate pain.   ADDERALL 30 MG tablet Generic drug:   amphetamine-dextroamphetamine Take 30 mg by mouth 3 (three) times daily.   ALPRAZolam 1 MG tablet Commonly known as:  XANAX Take 1 mg by mouth 4 (four) times daily as needed for anxiety.   AMBIEN 10 MG tablet Generic drug:  zolpidem Take 10 mg by mouth at bedtime.   amoxicillin-clavulanate 500-125 MG tablet Commonly known as:  AUGMENTIN Take 1 tablet (500 mg total) by mouth 2 (two) times daily for 14 days.   artificial tears Oint ophthalmic ointment Commonly known as:  LACRILUBE Place into the right eye every 8 (eight) hours for 7 days.   diclofenac sodium 1 % Gel Commonly known as:  VOLTAREN APPLY 2 GRAMS TO EACH KNEE IN THE MORNING AND AT BEDTIME AND APPLY 1 GRAM TO EACH KNEE IN THE AFTERNOON.   divalproex 500 MG 24 hr tablet Commonly known as:  DEPAKOTE ER TAKE 1 TABLET BY MOUTH EVERY NIGHT AT BEDTIME   doxycycline 100 MG tablet Commonly known as:  VIBRA-TABS Take 1 tablet (100 mg total) by mouth every 12 (twelve) hours for 14 days.   FREESTYLE LIBRE 14 DAY READER Devi AS DIRECTED   FREESTYLE LIBRE 14 DAY SENSOR Misc 1 Device by Does not apply route every 14 (fourteen) days.   glucose blood test strip Used to check blood sugars twice daily.   HYDROcodone-acetaminophen 5-325 MG tablet Commonly known as:  NORCO/VICODIN Take 1-2 tablets by mouth every 4 (four) hours as needed for moderate pain.   insulin aspart 100 UNIT/ML FlexPen Commonly known as:  NOVOLOG 10 units with breakfast, and 20 units with evening meal What changed:    how much to take  how to take this  when to take this   Insulin Glargine 100 UNIT/ML Solostar Pen Commonly known as:  LANTUS Inject 150 Units into the skin every morning.   Insulin Pen Needle 31G X 8 MM Misc USE TO INJECT LANTUS AND NOVOLOG DAILY   ketotifen 0.025 % ophthalmic solution Commonly known as:  ZADITOR Place 1 drop into both eyes 2 (two) times daily for 7 days.   levothyroxine 50 MCG tablet Commonly known as:   SYNTHROID, LEVOTHROID Take 1 tablet (50 mcg total) by mouth at bedtime.   LYRICA 100 MG capsule Generic drug:  pregabalin TAKE 1 CAPSULE BY MOUTH THREE TIMES DAILY   ondansetron 8 MG tablet Commonly known as:  ZOFRAN Take 8 mg by mouth every 8 (eight) hours as needed for nausea or vomiting.   pantoprazole 40 MG tablet Commonly known as:  PROTONIX Take 1 tablet (40 mg total) by mouth daily at 6 (six) AM. Start taking on:  10/06/2017   polyethylene glycol packet Commonly known as:  MIRALAX / GLYCOLAX Take 17 g by mouth 2 (two) times daily.   protriptyline 10 MG tablet Commonly known  as:  VIVACTIL Take 10 mg by mouth 3 (three) times daily.   ranitidine 150 MG tablet Commonly known as:  ZANTAC Take 150 mg by mouth daily as needed.   REXULTI 1 MG Tabs Generic drug:  Brexpiprazole Take 1 mg by mouth at bedtime.   rivaroxaban 20 MG Tabs tablet Commonly known as:  XARELTO Take 1 tablet (20 mg total) by mouth daily. What changed:    medication strength  how much to take   senna-docusate 8.6-50 MG tablet Commonly known as:  Senokot-S Take 1 tablet by mouth 2 (two) times daily.   TRINTELLIX 20 MG Tabs tablet Generic drug:  vortioxetine HBr Take 20 mg by mouth at bedtime.   UNABLE TO FIND CPAP MACHINE with standard [employer]claim nasal mask with humidifier. Set at 14 cwp What changed:  additional instructions            Durable Medical Equipment  (From admission, onward)         Start     Ordered   10/05/17 1203  For home use only DME 3 n 1  Once     10/05/17 1202   10/05/17 1203  For home use only DME Crutches  Once     10/05/17 1202         Allergies  Allergen Reactions  . Aspirin Swelling  . Ibuprofen Swelling  . Sustiva [Efavirenz] Swelling and Rash  . Nsaids Other (See Comments)    unknwn   Follow-up Information    Leandrew Koyanagi, MD In 1 week.   Specialty:  Orthopedic Surgery Why:  For wound re-check Contact information: [place] [address] [phone]        [name], MD. Schedule an appointment as soon as possible for a visit in 2 week(s).   Specialties:  Family Medicine, Sports Medicine Why:  f/u in 2-3 weeks. Contact information: [place] [address] [phone]        Thayer Headings, MD Follow up.   Specialty:  Infectious Diseases Why:  f/u as scheduled. Contact information: [address] [place] [address] [phone]        [facility] Follow up.   Specialty:  [place] Why:  Home Health Physical Therapy, Occupational Therapy and aide- agency will call to arrange initial visit Contact information: [address] [place] [address] [phone]            The results of significant diagnostics from this hospitalization (including imaging, microbiology, ancillary and laboratory) are listed below for reference.    Significant Diagnostic Studies: Ct Head Wo Contrast  Result Date: 09/28/2017 CLINICAL DATA:  Multiple recent falls EXAM: CT HEAD WITHOUT CONTRAST CT CERVICAL SPINE WITHOUT CONTRAST TECHNIQUE: Multidetector CT imaging of the head and cervical spine was performed following the standard protocol without intravenous contrast. Multiplanar CT image reconstructions of the cervical spine were also generated. COMPARISON:  None. FINDINGS: CT HEAD FINDINGS Brain: There is no mass, hemorrhage or extra-axial collection. The size and configuration of the ventricles and extra-axial CSF spaces are normal. There is no acute or chronic infarction. There is hypoattenuation of the periventricular white matter, most commonly indicating chronic ischemic microangiopathy. Vascular: No abnormal hyperdensity of the major intracranial arteries or dural venous sinuses. No intracranial atherosclerosis. Skull: The visualized skull base, calvarium and extracranial soft tissues are normal. Sinuses/Orbits: No  fluid levels or advanced mucosal thickening of the visualized paranasal sinuses. No  mastoid or middle ear effusion. The orbits are normal. CT CERVICAL SPINE FINDINGS Alignment: No static subluxation. Facets are aligned. Occipital condyles are normally positioned. Skull base and vertebrae: No acute fracture. Soft tissues and spinal canal: No prevertebral fluid or swelling. No visible canal hematoma. Disc levels: No advanced spinal canal or neural foraminal stenosis. Upper chest: No pneumothorax, pulmonary nodule or pleural effusion. Other: Normal visualized paraspinal cervical soft tissues. IMPRESSION: No acute intracranial abnormality. No fracture or static subluxation of the cervical spine. 1. Mild chronic small vessel disease. Electronically Signed   By: Ulyses Jarred M.D.   On: 09/28/2017 16:34   Ct Chest W Contrast  Result Date: 09/28/2017 CLINICAL DATA:  Recurrent falls for 3 days. Macroscopic hematuria. Lethargy. History of HIV, diabetes, bowel surgery. EXAM: CT CHEST, ABDOMEN, AND PELVIS WITH CONTRAST TECHNIQUE: Multidetector CT imaging of the chest, abdomen and pelvis was performed following the standard protocol during bolus administration of intravenous contrast. CONTRAST:  184m ISOVUE-300 IOPAMIDOL (ISOVUE-300) INJECTION 61% COMPARISON:  CT abdomen and pelvis April 13, 2016. FINDINGS: CT CHEST FINDINGS CARDIOVASCULAR: Heart is upper limits of normal size. Trace pericardial effusion. Thoracic aorta is normal course and caliber, unremarkable. Pulmonary vascular congestion. MEDIASTINUM/NODES: No mediastinal mass. No lymphadenopathy by CT size criteria. Normal appearance of thoracic esophagus though not tailored for evaluation. LUNGS/PLEURA: Tracheobronchial tree is patent, no pneumothorax. No pleural effusions, focal consolidations, pulmonary nodules or masses. Bilateral lower lobe atelectasis. MUSCULOSKELETAL: Nonacute. Old LEFT ninth rib fracture. Mild spondylosis. CT ABDOMEN AND PELVIS FINDINGS  HEPATOBILIARY: Diffusely hypodense liver compatible with steatosis. Enlarged, 24.8 cm in craniocaudad dimension. Normal gallbladder. PANCREAS: Fatty atrophy, punctate calcifications.  Nonacute. SPLEEN: Enlarged, 18.6 cm in craniocaudad dimension. ADRENALS/URINARY TRACT: Kidneys are orthotopic, demonstrating symmetric enhancement. 2 mm LEFT lower pole nephrolithiasis. No hydronephrosis or solid renal masses. The unopacified ureters are normal in course and caliber. Delayed imaging through the kidneys demonstrates symmetric prompt contrast excretion within the proximal urinary collecting system. Urinary bladder is adequately distended and unremarkable. Normal adrenal glands. STOMACH/BOWEL: Status post partial gastrectomy. The stomach, small and large bowel are normal in caliber without inflammatory changes. Large amount of retained large bowel stool. Midline mobile cecum. Normal appendix. VASCULAR/LYMPHATIC: Aortoiliac vessels are normal in course and caliber. Mild calcific atherosclerosis. No lymphadenopathy by CT size criteria. REPRODUCTIVE: Prostate size is normal.  Punctate calcifications. OTHER: No intraperitoneal free fluid or free air. MUSCULOSKELETAL: Non-acute. Small fat containing LEFT inguinal hernia. Bridging LEFT sacroiliac osteophyte. IMPRESSION: CT CHEST: 1. No CT findings of acute trauma. 2. Mild cardiomegaly and pulmonary vascular congestion. CT ABDOMEN AND PELVIS: 1. No CT findings of acute trauma. No acute intra-abdominopelvic process. 2. 2 mm nonobstructing LEFT nephrolithiasis. 3. Large amount of retained large bowel stool without bowel obstruction. 4. Worsening hepatosplenomegaly. Aortic Atherosclerosis (ICD10-I70.0). Electronically Signed   By: CElon AlasM.D.   On: 09/28/2017 16:37   Ct Cervical Spine Wo Contrast  Result Date: 09/28/2017 CLINICAL DATA:  Multiple recent falls EXAM: CT HEAD WITHOUT CONTRAST CT CERVICAL SPINE WITHOUT CONTRAST TECHNIQUE: Multidetector CT imaging of the  head and cervical spine was performed following the standard protocol without intravenous contrast. Multiplanar CT image reconstructions of the cervical spine were also generated. COMPARISON:  None. FINDINGS: CT HEAD FINDINGS Brain: There is no mass, hemorrhage or extra-axial collection. The size and configuration of the ventricles and extra-axial CSF spaces are normal. There is no acute or chronic infarction. There is hypoattenuation of the periventricular white matter, most commonly indicating chronic ischemic microangiopathy. Vascular: No  abnormal hyperdensity of the major intracranial arteries or dural venous sinuses. No intracranial atherosclerosis. Skull: The visualized skull base, calvarium and extracranial soft tissues are normal. Sinuses/Orbits: No fluid levels or advanced mucosal thickening of the visualized paranasal sinuses. No mastoid or middle ear effusion. The orbits are normal. CT CERVICAL SPINE FINDINGS Alignment: No static subluxation. Facets are aligned. Occipital condyles are normally positioned. Skull base and vertebrae: No acute fracture. Soft tissues and spinal canal: No prevertebral fluid or swelling. No visible canal hematoma. Disc levels: No advanced spinal canal or neural foraminal stenosis. Upper chest: No pneumothorax, pulmonary nodule or pleural effusion. Other: Normal visualized paraspinal cervical soft tissues. IMPRESSION: No acute intracranial abnormality. No fracture or static subluxation of the cervical spine. 1. Mild chronic small vessel disease. Electronically Signed   By: Ulyses Jarred M.D.   On: 09/28/2017 16:34   [name] Contrast  Result Date: 09/24/2017 CLINICAL DATA:  Subacute right facial weakness. Possible Bell's palsy. Creatinine was obtained on site at [facility] at [address] Results: Creatinine 1.6 mg/dL. EXAM: MRI HEAD WITHOUT AND WITH CONTRAST TECHNIQUE: Multiplanar, multiecho pulse sequences of the brain and surrounding structures were  obtained without and with intravenous contrast. CONTRAST:  40m MULTIHANCE GADOBENATE DIMEGLUMINE 529 MG/ML IV SOLN COMPARISON:  Brain MRI 05/13/2015 FINDINGS: BRAIN: There is no acute infarct, acute hemorrhage or mass effect. The midline structures are normal. There are no old unchanged appearance of small right pontine chronic infarct. The white matter signal is normal for the patient's age. Generalized atrophy without lobar predilection. Susceptibility-sensitive sequences show no chronic microhemorrhage or superficial siderosis. No abnormal contrast enhancement. No asymmetric facial nerve contrast enhancement. VASCULAR: Major intracranial arterial and venous sinus flow voids are preserved. SKULL AND UPPER CERVICAL SPINE: The visualized skull base, calvarium, upper cervical spine and extracranial soft tissues are normal. SINUSES/ORBITS: No fluid levels or advanced mucosal thickening. No mastoid or middle ear effusion. The orbits are normal. IMPRESSION: 1. Old right pontine infarct and mild generalized atrophy without acute abnormality. 2. No asymmetric facial nerve contrast-enhancement. Electronically Signed   By: KUlyses JarredM.D.   On: 09/24/2017 19:44   Ct Abdomen Pelvis W Contrast  Result Date: 09/28/2017 CLINICAL DATA:  Recurrent falls for 3 days. Macroscopic hematuria. Lethargy. History of HIV, diabetes, bowel surgery. EXAM: CT CHEST, ABDOMEN, AND PELVIS WITH CONTRAST TECHNIQUE: Multidetector CT imaging of the chest, abdomen and pelvis was performed following the standard protocol during bolus administration of intravenous contrast. CONTRAST:  1054mISOVUE-300 IOPAMIDOL (ISOVUE-300) INJECTION 61% COMPARISON:  CT abdomen and pelvis April 13, 2016. FINDINGS: CT CHEST FINDINGS CARDIOVASCULAR: Heart is upper limits of normal size. Trace pericardial effusion. Thoracic aorta is normal course and caliber, unremarkable. Pulmonary vascular congestion. MEDIASTINUM/NODES: No mediastinal mass. No lymphadenopathy  by CT size criteria. Normal appearance of thoracic esophagus though not tailored for evaluation. LUNGS/PLEURA: Tracheobronchial tree is patent, no pneumothorax. No pleural effusions, focal consolidations, pulmonary nodules or masses. Bilateral lower lobe atelectasis. MUSCULOSKELETAL: Nonacute. Old LEFT ninth rib fracture. Mild spondylosis. CT ABDOMEN AND PELVIS FINDINGS HEPATOBILIARY: Diffusely hypodense liver compatible with steatosis. Enlarged, 24.8 cm in craniocaudad dimension. Normal gallbladder. PANCREAS: Fatty atrophy, punctate calcifications.  Nonacute. SPLEEN: Enlarged, 18.6 cm in craniocaudad dimension. ADRENALS/URINARY TRACT: Kidneys are orthotopic, demonstrating symmetric enhancement. 2 mm LEFT lower pole nephrolithiasis. No hydronephrosis or solid renal masses. The unopacified ureters are normal in course and caliber. Delayed imaging through the kidneys demonstrates symmetric prompt contrast excretion within the proximal urinary collecting system. Urinary  bladder is adequately distended and unremarkable. Normal adrenal glands. STOMACH/BOWEL: Status post partial gastrectomy. The stomach, small and large bowel are normal in caliber without inflammatory changes. Large amount of retained large bowel stool. Midline mobile cecum. Normal appendix. VASCULAR/LYMPHATIC: Aortoiliac vessels are normal in course and caliber. Mild calcific atherosclerosis. No lymphadenopathy by CT size criteria. REPRODUCTIVE: Prostate size is normal.  Punctate calcifications. OTHER: No intraperitoneal free fluid or free air. MUSCULOSKELETAL: Non-acute. Small fat containing LEFT inguinal hernia. Bridging LEFT sacroiliac osteophyte. IMPRESSION: CT CHEST: 1. No CT findings of acute trauma. 2. Mild cardiomegaly and pulmonary vascular congestion. CT ABDOMEN AND PELVIS: 1. No CT findings of acute trauma. No acute intra-abdominopelvic process. 2. 2 mm nonobstructing LEFT nephrolithiasis. 3. Large amount of retained large bowel stool without  bowel obstruction. 4. Worsening hepatosplenomegaly. Aortic Atherosclerosis (ICD10-I70.0). Electronically Signed   By: Daniel [name] M.D.   On: 09/28/2017 16:37   Dg Chest Portable 1 View  Result Date: 09/28/2017 CLINICAL DATA:  Shortness of breath. EXAM: PORTABLE CHEST 1 VIEW COMPARISON:  Chest x-ray dated December 25, 2016. FINDINGS: The heart size and mediastinal contours are within normal limits. Both lungs are grossly clear. The visualized skeletal structures are unremarkable. IMPRESSION: No active disease. Electronically Signed   By: Daniel [name] M.D.   On: 09/28/2017 14:54   Dg Foot Complete Right  Result Date: 09/30/2017 CLINICAL DATA:  62 year old diabetic with 2-3 month history of blistering of the RIGHT foot after a deep treatment procedure. Currently patient has open wounds on the foot with marked swelling. Prior RIGHT great toe amputation. EXAM: RIGHT FOOT COMPLETE - 3+ VIEW COMPARISON:  09/30/2016, 06/27/2016 and earlier. FINDINGS: Severe diffuse soft tissue swelling, especially involving the great toe stump and the second toe. Dislocation of the second MTP joint, with slight osteolysis of the head of the proximal phalanx of the second toe. Flattening of the head of the third metatarsal with subchondral cyst formation since the prior examination 1 year ago. Joint space narrowing involving the fourth MTP joint, also new since the examination 1 year ago. Remaining joint spaces well-preserved. Mild osseous demineralization. IMPRESSION: 1. Dislocation of the second MTP joint with possible osteomyelitis involving the base of the proximal phalanx of the second toe. 2. Flattening of the head of the third metatarsal, new since the examination 1 year ago, possibly related to osteonecrosis. 3. Developing osteoarthritis involving the fourth MTP joint. 4. Soft tissue swelling involving the right great toe stump and the second toe, associated with ulcerations. Electronically Signed   By: Evangeline Dakin  M.D.   On: 09/30/2017 20:59    Microbiology: Recent Results (from the past 240 hour(s))  Blood culture (routine x 2)     Status: None   Collection Time: 09/30/17  6:12 PM  Result Value Ref Range Status   Specimen Description BLOOD RIGHT ARM  Final   Special Requests   Final    BOTTLES DRAWN AEROBIC AND ANAEROBIC Blood Culture adequate volume   Culture   Final    NO GROWTH 5 DAYS Performed at [name], [address], [place], [place] [address]    Report Status 10/05/2017 FINAL  Final  Blood culture (routine x 2)     Status: None   Collection Time: 09/30/17  9:34 PM  Result Value Ref Range Status   Specimen Description RIGHT ANTECUBITAL  Final   Special Requests   Final    BOTTLES DRAWN AEROBIC AND ANAEROBIC Blood Culture adequate volume   Culture  Final    NO GROWTH 5 DAYS Performed at [facility], [address], [place], [place] [address]    Report Status 10/05/2017 FINAL  Final  MRSA PCR Screening     Status: None   Collection Time: 10/01/17 11:56 AM  Result Value Ref Range Status   MRSA by PCR NEGATIVE NEGATIVE Final    Comment:        The GeneXpert MRSA Assay (FDA approved for NASAL specimens only), is one component of a comprehensive MRSA colonization surveillance program. It is not intended to diagnose MRSA infection nor to guide or monitor treatment for MRSA infections. Performed at [name], [place] [address], [place], [place] [address]      Labs: Basic Metabolic Panel: Recent Labs  Lab 10/01/17 0441 10/01/17 1134 10/02/17 0422 10/03/17 0431 10/04/17 0047 10/05/17 0346  NA 133*  --  136 138 137 137  K 3.4*  --  3.6 4.5 4.2 4.0  CL 102  --  101 104 102 106  CO2 23  --  24 26 25 22   GLUCOSE 172*  --  120* 135* 127* 130*  BUN 22  --  21 19 15 16   CREATININE 1.34*  --  1.48* 1.54* 1.48* 1.45*  CALCIUM 8.4*  --  8.8* 8.8* 8.9 8.6*  MG  --  2.0  --   --   --   --    Liver Function Tests: Recent Labs  Lab 09/30/17 1816  AST 30  ALT 32   ALKPHOS 83  BILITOT 1.1  PROT 7.5  ALBUMIN 3.3*   No results for input(s): LIPASE, AMYLASE in the last 168 hours. No results for input(s): AMMONIA in the last 168 hours. CBC: Recent Labs  Lab 09/30/17 1816 10/01/17 0441 10/02/17 0422 10/03/17 0431 10/04/17 0047 10/05/17 0346  WBC 8.6 6.3 5.7 7.8 9.1 8.8  NEUTROABS 5.1 3.1  --  4.6  --   --   HGB 14.7 13.7 13.8 14.3 14.3 13.7  HCT 41.7 38.6* 39.7 42.1 41.3 40.1  MCV 95.0 93.7 94.7 93.8 94.5 94.1  PLT 172 156 158 200 209 189   Cardiac Enzymes: No results for input(s): CKTOTAL, CKMB, CKMBINDEX, TROPONINI in the last 168 hours. BNP: BNP (last 3 results) No results for input(s): BNP in the last 8760 hours.  ProBNP (last 3 results) Recent Labs    12/11/16 1853  PROBNP 31    CBG: Recent Labs  Lab 10/04/17 1959 10/04/17 2342 10/05/17 0532 10/05/17 0818 10/05/17 1152  GLUCAP 190* 167* 131* 162* 144*       Signed:  Irine Seal MD.  [facility] 10/05/2017, 3:41 PM

## 2017-10-05 NOTE — Care Management Important Message (Signed)
Important Message  Patient Details  Name: ISAIHA ASARE MRN: [id number] Date of Birth: [birth year]   Medicare Important Message Given:  Yes    Erenest Rasher, RN 10/05/2017, 3:07 PM

## 2017-10-05 NOTE — Progress Notes (Signed)
Physical Therapy Treatment Patient Details Name: Daniel [name] MRN: [id number] DOB: 05-28-1955 Today's Date: 10/05/2017    History of Present Illness 62 y/o male s/p transmetatarsal amputation on R LE. PMH HIV, CKD stage 3, DM, and history of ray amputation on R LE.     PT Comments    Pt seen for second session as plan is for pt to d/c home today. Pt perseverating on using crutches for mobility and stair negotiation, however upon trial during session pt was unable to manage crutches without significant assistance. Attempted to take 1 step forward with crutches and pt was unable to complete due to decreased UE and core strength required to advance weight bearing foot. Pt did complete 1 stair with RW for support and +2 min assist. It was strongly recommended to pt that he primarily use the wheelchair for mobility until strength improves and/or the doctor advances pt's weight bearing restrictions. Pt initially agreeable, however after session patient asking to speak to therapist again - reports he does not want the wheelchair and that he has already ordered himself a knee scooter online. Pt educated on safety and continued recommendation for the wheelchair. Will continue to follow.    Follow Up Recommendations  Home health PT;Supervision/Assistance - 24 hour     Equipment Recommendations  Wheelchair (measurements PT);Wheelchair cushion (measurements PT)    Recommendations for Other Services       Precautions / Restrictions Precautions Precautions: Fall Restrictions Weight Bearing Restrictions: Yes RLE Weight Bearing: Non weight bearing    Mobility  Bed Mobility Overal bed mobility: Needs Assistance Bed Mobility: Sit to Supine     Supine to sit: Min guard Sit to supine: Min guard   General bed mobility comments: Assist for management of catheter but was able to transition to long sitting in bed without assistance.   Transfers Overall transfer level: Needs assistance Equipment  used: Rolling walker (2 wheeled) Transfers: Sit to/from [employer] Sit to Stand: Min assist;Mod assist Stand pivot transfers: Min assist       General transfer comment: Assist level progressively increased as pt fatigued. Attempted with RW and crutches per pt request. Pt was not able to safely manage crutches and RW was recommended choice.   Ambulation/Gait Ambulation/Gait assistance: Min assist;+2 safety/equipment Gait Distance (Feet): 5 Feet Assistive device: Rolling walker (2 wheeled) Gait Pattern/deviations: Step-to pattern;Decreased stride length;Trunk flexed Gait velocity: Decreased Gait velocity interpretation: <1.8 ft/sec, indicate of risk for recurrent falls General Gait Details: Attempted with crutches per pt request first. Pt was unable to take 1 step due to decreased UE and core strength to support himself during swing through. With RW, pt only able to manage ~5 feet before requiring a seated rest break.    Stairs Stairs: Yes Stairs assistance: Min assist;+2 physical assistance;+2 safety/equipment Stair Management: No rails;Backwards;With walker Number of Stairs: 1 General stair comments: VC's for sequencing and general safety. Pt was able to negotiate 1 stair and was successful with +2 min assist for balance support and power-up to step.    Wheelchair Mobility    Modified Rankin (Stroke Patients Only)       Balance Overall balance assessment: Needs assistance Sitting-balance support: No upper extremity supported Sitting balance-Leahy Scale: Good     Standing balance support: Bilateral upper extremity supported;During functional activity Standing balance-Leahy Scale: Poor Standing balance comment: Pt required bil UE support with dynamic stance activity to maintain NWB precautions  Cognition Arousal/Alertness: Awake/alert Behavior During Therapy: WFL for tasks assessed/performed Overall Cognitive Status:  Within Functional Limits for tasks assessed                                        Exercises      General Comments        Pertinent Vitals/Pain Pain Assessment: Faces Pain Score: 0-No pain Faces Pain Scale: Hurts little more Pain Location: R foot Pain Descriptors / Indicators: Operative site guarding Pain Intervention(s): Limited activity within patient's tolerance;Monitored during session;Repositioned    Home Living Family/patient expects to be discharged to:: Private residence Living Arrangements: Other relatives Available Help at Discharge: Other (Comment)(reporst that his waunt's housekeeper/helper will assist him as well) Type of Home: House Home Access: Stairs to enter Entrance Stairs-Rails: None Home Layout: Two level Home Equipment: Walker - 2 wheels;Hand held shower head;Cane - single point;Shower seat Additional Comments: Bathroom is located on first level, but pts bedroom is on second level. Able to sleep on couch or in recliner if has to. Aunt and brother live with pt, but his Elenor Legato is very demented and requires assitance herself. Is hopefull her aid/housemaid will also be able to assist him with needs at d/c. Brother lives with pt also but does not believe he will be able to assist.    Prior Function Level of Independence: Independent          PT Goals (current goals can now be found in the care plan section) Acute Rehab PT Goals Patient Stated Goal: "to go home" PT Goal Formulation: With patient Time For Goal Achievement: 10/12/17 Potential to Achieve Goals: Fair Progress towards PT goals: Progressing toward goals    Frequency    Min 3X/week      PT Plan Current plan remains appropriate    Co-evaluation              AM-PAC PT "6 Clicks" Daily Activity  Outcome Measure  Difficulty turning over in bed (including adjusting bedclothes, sheets and blankets)?: A Lot Difficulty moving from lying on back to sitting on the side of  the bed? : A Lot Difficulty sitting down on and standing up from a chair with arms (e.g., wheelchair, bedside commode, etc,.)?: Unable Help needed moving to and from a bed to chair (including a wheelchair)?: A Lot Help needed walking in hospital room?: A Lot Help needed climbing 3-5 steps with a railing? : A Lot 6 Click Score: 11    End of Session Equipment Utilized During Treatment: Gait belt Activity Tolerance: Patient tolerated treatment well Patient left: in chair;with call bell/phone within reach Nurse Communication: Mobility status PT Visit Diagnosis: Other abnormalities of gait and mobility (R26.89);Muscle weakness (generalized) (M62.81);History of falling (Z91.81)     Time: 6378-5885 PT Time Calculation (min) (ACUTE ONLY): 26 min  Charges:  $Gait Training: 23-37 mins                     Rolinda Roan, PT, DPT Acute Rehabilitation Services Pager: [phone]    Thelma Comp 10/05/2017, 3:37 PM

## 2017-10-05 NOTE — Progress Notes (Signed)
RN gave pt discharge instructions and pt stated understanding, PT iv has been removed all belongings have been packed RN gave pt home medications that were picked up from pharmacy. Family member packed belongings took 3 in 1 to car. Awaiting wheelchair

## 2017-10-05 NOTE — Progress Notes (Signed)
Physical Therapy Evaluation Patient Details Name: Daniel [name] MRN: [id number] DOB: 1955-05-10 Today's Date: 10/05/2017   History of Present Illness  62 y.o. male s/p transmetatarsal amputation on R LE. PMH includes HIV, CKD stage 3, DM, and history of ray amputation on R LE.    Clinical Impression  Patient is s/p above surgery resulting in functional limitations due to the deficits listed below (see PT Problem List). Patient performed transfers with gross min A, requiring cues to maintain NWB precautions, hand placement, and safety with RW. Pt initially anxious about stand pivot with hop-to pattern secondary to baseline L knee pain, but able to complete transfer in 3-4 hops without L LE knee buckling or reports of increased pain. Pt utilized RW for transfers at time of eval, but requests crutches at next session for stair negotiation.Pt has RW at d/c and recommend w/c at d/c also for energy conservation with longer distances. Pt does not have much help at d/c, but hopeful the aid that assist's his elderly aunt will be able to also assist with his needs upon d/c. Patient will benefit from skilled PT to increase their independence and safety with mobility to allow discharge to the venue listed below.       Follow Up Recommendations Home health PT    Equipment Recommendations  Wheelchair (measurements PT);Wheelchair cushion (measurements PT)    Recommendations for Other Services       Precautions / Restrictions Precautions Precautions: Fall Restrictions Weight Bearing Restrictions: Yes RLE Weight Bearing: Non weight bearing      Mobility  Bed Mobility Overal bed mobility: Needs Assistance Bed Mobility: Supine to Sit     Supine to sit: Min guard     General bed mobility comments: pt up in recliner uopn arrival. Per PT note pt is min guard A  Transfers Overall transfer level: Needs assistance Equipment used: Rolling walker (2 wheeled) Transfers: Sit to/from [employer] Sit to Stand: Min assist Stand pivot transfers: Min assist       General transfer comment: Min A for steadying RW, and min cues for hand placement, safety and maintence of NWM precautions  Ambulation/Gait             General Gait Details: unable   Stairs            Wheelchair Mobility    Modified Rankin (Stroke Patients Only)       Balance Overall balance assessment: Needs assistance Sitting-balance support: No upper extremity supported Sitting balance-Leahy Scale: Good     Standing balance support: Bilateral upper extremity supported;During functional activity Standing balance-Leahy Scale: Poor Standing balance comment: Pt required bil UE support with dynamic stance activity to maintain NWB precautions                              Pertinent Vitals/Pain Pain Assessment: No/denies pain Pain Score: 0-No pain    Home Living Family/patient expects to be discharged to:: Private residence Living Arrangements: Other relatives Available Help at Discharge: Other (Comment)(reporst that his waunt's housekeeper/helper will assist him as well) Type of Home: House Home Access: Stairs to enter Entrance Stairs-Rails: None Entrance Stairs-Number of Steps: 1 Home Layout: Two level Home Equipment: Walker - 2 wheels;Hand held shower head;Cane - single point;Shower seat Additional Comments: Bathroom is located on first level, but pts bedroom is on second level. Able to sleep on couch or in recliner if has to. Aunt and brother live  with pt, but his Aunt has multiple medical problems and requires assitance of aid. Is hopefull her aid/housemaid will also be able to assist him with needs at d/c. Brother lives with pt also but does not believe he will be able to assist.    Prior Function Level of Independence: Independent               Hand Dominance   Dominant Hand: Right    Extremity/Trunk Assessment   Upper Extremity Assessment Upper Extremity  Assessment: Overall WFL for tasks assessed    Lower Extremity Assessment Lower Extremity Assessment: Defer to PT evaluation    Cervical / Trunk Assessment Cervical / Trunk Assessment: Normal  Communication   Communication: No difficulties  Cognition Arousal/Alertness: Awake/alert Behavior During Therapy: WFL for tasks assessed/performed Overall Cognitive Status: Within Functional Limits for tasks assessed                                        General Comments      Exercises     Assessment/Plan    PT Assessment Patient needs continued PT services  PT Problem List Decreased strength;Decreased range of motion;Decreased activity tolerance;Decreased balance;Decreased mobility;Decreased coordination;Decreased knowledge of use of DME;Decreased safety awareness       PT Treatment Interventions DME instruction;Gait training;Stair training;Functional mobility training;Therapeutic activities;Therapeutic exercise;Balance training;Patient/family education    PT Goals (Current goals can be found in the Care Plan section)  Acute Rehab PT Goals Patient Stated Goal: "to go home" PT Goal Formulation: With patient Time For Goal Achievement: 10/12/17 Potential to Achieve Goals: Fair    Frequency Min 3X/week   Barriers to discharge Decreased caregiver support      Co-evaluation               AM-PAC PT "6 Clicks" Daily Activity  Outcome Measure Difficulty turning over in bed (including adjusting bedclothes, sheets and blankets)?: A Lot Difficulty moving from lying on back to sitting on the side of the bed? : A Lot Difficulty sitting down on and standing up from a chair with arms (e.g., wheelchair, bedside commode, etc,.)?: Unable Help needed moving to and from a bed to chair (including a wheelchair)?: A Lot Help needed walking in hospital room?: Total Help needed climbing 3-5 steps with a railing? : A Lot 6 Click Score: 10    End of Session Equipment  Utilized During Treatment: Gait belt Activity Tolerance: Patient tolerated treatment well Patient left: in chair;with call bell/phone within reach Nurse Communication: Mobility status PT Visit Diagnosis: Other abnormalities of gait and mobility (R26.89);Muscle weakness (generalized) (M62.81);History of falling (Z91.81)    Time: 7902-4097 PT Time Calculation (min) (ACUTE ONLY): 31 min   Charges:   PT Evaluation $PT Eval Moderate Complexity: 1 Mod PT Treatments $Gait Training: 8-22 mins        Einar Crow, [place]  Student Physical Therapist Acute Rehab [phone]   Einar Crow 10/05/2017, 1:33 PM

## 2017-10-06 ENCOUNTER — Telehealth (INDEPENDENT_AMBULATORY_CARE_PROVIDER_SITE_OTHER): Payer: Self-pay | Admitting: Orthopaedic Surgery

## 2017-10-06 ENCOUNTER — Ambulatory Visit: Payer: PPO | Admitting: Sports Medicine

## 2017-10-06 ENCOUNTER — Ambulatory Visit: Payer: PPO | Admitting: Neurology

## 2017-10-06 ENCOUNTER — Telehealth: Payer: Self-pay | Admitting: Family Medicine

## 2017-10-06 DIAGNOSIS — Z794 Long term (current) use of insulin: Secondary | ICD-10-CM | POA: Diagnosis not present

## 2017-10-06 DIAGNOSIS — Z7901 Long term (current) use of anticoagulants: Secondary | ICD-10-CM | POA: Diagnosis not present

## 2017-10-06 DIAGNOSIS — Z86718 Personal history of other venous thrombosis and embolism: Secondary | ICD-10-CM | POA: Diagnosis not present

## 2017-10-06 DIAGNOSIS — B20 Human immunodeficiency virus [HIV] disease: Secondary | ICD-10-CM | POA: Diagnosis not present

## 2017-10-06 DIAGNOSIS — E1122 Type 2 diabetes mellitus with diabetic chronic kidney disease: Secondary | ICD-10-CM | POA: Diagnosis not present

## 2017-10-06 DIAGNOSIS — N183 Chronic kidney disease, stage 3 (moderate): Secondary | ICD-10-CM | POA: Diagnosis not present

## 2017-10-06 DIAGNOSIS — K5900 Constipation, unspecified: Secondary | ICD-10-CM | POA: Diagnosis not present

## 2017-10-06 DIAGNOSIS — G4733 Obstructive sleep apnea (adult) (pediatric): Secondary | ICD-10-CM | POA: Diagnosis not present

## 2017-10-06 DIAGNOSIS — F330 Major depressive disorder, recurrent, mild: Secondary | ICD-10-CM | POA: Diagnosis not present

## 2017-10-06 DIAGNOSIS — L03115 Cellulitis of right lower limb: Secondary | ICD-10-CM | POA: Diagnosis not present

## 2017-10-06 DIAGNOSIS — Z4781 Encounter for orthopedic aftercare following surgical amputation: Secondary | ICD-10-CM | POA: Diagnosis not present

## 2017-10-06 DIAGNOSIS — Z89421 Acquired absence of other right toe(s): Secondary | ICD-10-CM | POA: Diagnosis not present

## 2017-10-06 DIAGNOSIS — M86171 Other acute osteomyelitis, right ankle and foot: Secondary | ICD-10-CM | POA: Diagnosis not present

## 2017-10-06 DIAGNOSIS — E039 Hypothyroidism, unspecified: Secondary | ICD-10-CM | POA: Diagnosis not present

## 2017-10-06 DIAGNOSIS — E11628 Type 2 diabetes mellitus with other skin complications: Secondary | ICD-10-CM | POA: Diagnosis not present

## 2017-10-06 LAB — HIV-1 RNA QUANT-NO REFLEX-BLD: LOG10 HIV-1 RNA: UNDETERMINED log10copy/mL

## 2017-10-06 NOTE — Care Management Important Message (Signed)
Important Message  Patient Details  Name: Daniel [name] MRN: [id number] Date of Birth: [birth year]   Medicare Important Message Given:  Yes    [name] [address] 10/06/2017, 8:08 AM

## 2017-10-06 NOTE — Telephone Encounter (Signed)
yes

## 2017-10-06 NOTE — Telephone Encounter (Signed)
Debbie PT at [facility] had her eval with patient today and is requesting verbal orders  2 x for 3 weeks   CB # [phone]

## 2017-10-06 NOTE — Telephone Encounter (Signed)
Copied from [place] [id number]. Topic: General - Other >> Oct 06, 2017  4:53 PM Valla Leaver wrote: Reason for CRM: Jackelyn Poling, PT with [facility] calling to notify that Lyrica and Divalproex are on the patient's medication list and she needs an "okay" from Dr. Nyoka Cowden to continue these together. Surgeon prescribed hydrocodone w/ acetaminophen and Debbie wants to know what is the max amount of acetaminophen the patient can take in combination with it? CB#[phone]

## 2017-10-06 NOTE — Telephone Encounter (Signed)
Okay on orders.

## 2017-10-07 NOTE — Telephone Encounter (Signed)
Max dose of 4 g of acetaminophen total per day.  He has been on Depakote as well as Lyrica in the past, can continue same.

## 2017-10-07 NOTE — Telephone Encounter (Signed)
Called to advise.

## 2017-10-08 NOTE — Telephone Encounter (Signed)
Daniel [name] was informed.

## 2017-10-09 ENCOUNTER — Encounter (INDEPENDENT_AMBULATORY_CARE_PROVIDER_SITE_OTHER): Payer: Self-pay | Admitting: Orthopaedic Surgery

## 2017-10-09 ENCOUNTER — Ambulatory Visit (INDEPENDENT_AMBULATORY_CARE_PROVIDER_SITE_OTHER): Payer: PPO | Admitting: Physician Assistant

## 2017-10-09 DIAGNOSIS — Z89431 Acquired absence of right foot: Secondary | ICD-10-CM | POA: Insufficient documentation

## 2017-10-09 NOTE — Progress Notes (Signed)
Post-Op Visit Note   Patient: Daniel [name]           Date of Birth: 01-20-1956           MRN: [id number] Visit Date: 10/09/2017 PCP: [name], MD   Assessment & Plan:  Chief Complaint:  Chief Complaint  Patient presents with  . Right Foot - Pain, Routine Post Op   Visit Diagnoses:  1. S/P transmetatarsal amputation of foot, right ([place])     Plan: Daniel [name] is a pleasant 62 year old gentleman who presents to our clinic today 7 days status post right transmetatarsal amputation, date of surgery 10/02/2017.  He has been somewhat compliant nonweightbearing using a knee scooter.  He has not been elevating for swelling.  No fevers, chills or any other systemic symptoms.  He has noticed bloody drainage to the dressing.  Examination of the right foot shows slight dehiscence of the wound with mild active bloody drainage.  Minimal pair incisional erythema.  No warmth.  No tenderness however the patient does have neuropathy.  At this point, we would like the patient to elevate his right lower extremity above the heart at all times other than when he has to eat or use the restroom.  He will remain nonweightbearing, but can use his heel for transfers to areas such as the restroom.  We do not want him to get this wet yet.  He will continue his doxycycline for which she has 1 more week.  We will do wet-to-dry dressing changes.  He already has a home health nurse so we wrote a new prescription today for them to start with dressing changes twice daily.  He will follow-up with [name] in 2 weeks time for hopeful suture removal.  Call with concerning questions in the meantime.  Follow-Up Instructions: Return in about 2 weeks (around 10/23/2017).   Orders:  No orders of the defined types were placed in this encounter.  No orders of the defined types were placed in this encounter.   Imaging: No results found.  PMFS History: Patient Active Problem List   Diagnosis Date Noted  . S/P transmetatarsal  amputation of foot, right ([place]) 10/09/2017  . Deep vein thrombosis (DVT) ([place])   . Osteomyelitis ([place])   . Hypothyroidism   . Constipation   . History of DVT (deep vein thrombosis) 09/30/2017  . Morbid obesity due to excess calories ([place]) complicated by DM / hyperlipidemia 01/15/2017  . DOE (dyspnea on exertion) 01/13/2017  . Chronic migraine 09/17/2016  . Gait abnormality 09/17/2016  . Diabetic foot infection ([place]) 06/28/2016  . Fall 12/05/2015  . Toe ulcer, right ([place]) 09/19/2015  . Decreased pedal pulses 09/19/2015  . OSA on CPAP 09/05/2015  . Major depressive disorder, recurrent episode, moderate ([place]) 09/05/2015  . Low back pain 06/11/2015  . Abnormality of gait 06/11/2015  . Dizziness 03/17/2015  . Weakness 02/21/2015  . Chronic renal insufficiency, stage III (moderate) ([place]) 08/09/2014  . Insulin-requiring or dependent type II diabetes mellitus ([place]) 11/07/2013  . Hematuria 06/21/2013  . Hepatic steatosis 09/09/2010  . Human immunodeficiency virus (HIV) disease ([place]) 06/04/2006  . HERPES ZOSTER, UNCOMPLICATED 73/22/0254  . THROMBOPHLEBITIS NOS 06/04/2006  . GERD 06/04/2006  . ARTHRITIS, HAND 06/04/2006   Past Medical History:  Diagnosis Date  . ADHD (attention deficit hyperactivity disorder)   . Anxiety   . Chronic kidney disease   . Clotting disorder ([place])   . Depression   . Diabetes mellitus without complication ([place])   .  Diabetes mellitus, type II ([place])   . Dizziness 03/17/2015  . GERD (gastroesophageal reflux disease)   . HIV disease ([place])   . HIV infection ([place])   . Liver disease   . OSA (obstructive sleep apnea) 07/25/2015   Uses CPAP regularly  . Peripheral vascular disease ([place])   . Ulcer     Family History  Problem Relation Age of Onset  . Depression Brother   . Throat cancer Brother        half brother, never smoker  . COPD Mother   . Diabetes Neg Hx     Past Surgical History:  Procedure Laterality Date  . AMPUTATION Right 10/02/2017    Procedure: RIGHT TRANSMETATARSAL AMPUTATION;  Surgeon: Leandrew Koyanagi, MD;  Location: [facility];  Service: Orthopedics;  Laterality: Right;  . SMALL INTESTINE SURGERY    . STOMACH SURGERY    . TOE AMPUTATION Right 08/2016   right great toe   Social History   Occupational History    Comment: DISABILITY  Tobacco Use  . Smoking status: Former Smoker    Packs/day: 0.10    Years: 10.00    Pack years: 1.00    Types: Cigars    Last attempt to quit: 08/09/2014    Years since quitting: 3.1  . Smokeless tobacco: Never Used  Substance and Sexual Activity  . Alcohol use: No    Alcohol/week: 0.0 standard drinks  . Drug use: No  . Sexual activity: Not Currently    Partners: Male    Comment: pt. declined condoms

## 2017-10-12 ENCOUNTER — Other Ambulatory Visit: Payer: Self-pay | Admitting: Endocrinology

## 2017-10-16 ENCOUNTER — Emergency Department (HOSPITAL_COMMUNITY)
Admission: EM | Admit: 2017-10-16 | Discharge: 2017-10-16 | Disposition: A | Discharge status: Home / Self Care | Payer: PPO | Attending: Emergency Medicine | Admitting: Emergency Medicine

## 2017-10-16 ENCOUNTER — Other Ambulatory Visit: Payer: Self-pay

## 2017-10-16 ENCOUNTER — Emergency Department (HOSPITAL_COMMUNITY): Payer: PPO

## 2017-10-16 ENCOUNTER — Encounter (HOSPITAL_COMMUNITY): Payer: Self-pay | Admitting: Emergency Medicine

## 2017-10-16 DIAGNOSIS — Z87891 Personal history of nicotine dependence: Secondary | ICD-10-CM | POA: Diagnosis not present

## 2017-10-16 DIAGNOSIS — E039 Hypothyroidism, unspecified: Secondary | ICD-10-CM | POA: Insufficient documentation

## 2017-10-16 DIAGNOSIS — Z7901 Long term (current) use of anticoagulants: Secondary | ICD-10-CM | POA: Insufficient documentation

## 2017-10-16 DIAGNOSIS — Z794 Long term (current) use of insulin: Secondary | ICD-10-CM | POA: Diagnosis not present

## 2017-10-16 DIAGNOSIS — Z79899 Other long term (current) drug therapy: Secondary | ICD-10-CM | POA: Insufficient documentation

## 2017-10-16 DIAGNOSIS — N189 Chronic kidney disease, unspecified: Secondary | ICD-10-CM | POA: Diagnosis not present

## 2017-10-16 DIAGNOSIS — R404 Transient alteration of awareness: Secondary | ICD-10-CM | POA: Diagnosis not present

## 2017-10-16 DIAGNOSIS — E1165 Type 2 diabetes mellitus with hyperglycemia: Secondary | ICD-10-CM | POA: Diagnosis not present

## 2017-10-16 DIAGNOSIS — R531 Weakness: Secondary | ICD-10-CM | POA: Diagnosis not present

## 2017-10-16 DIAGNOSIS — R918 Other nonspecific abnormal finding of lung field: Secondary | ICD-10-CM | POA: Diagnosis not present

## 2017-10-16 DIAGNOSIS — R0902 Hypoxemia: Secondary | ICD-10-CM | POA: Diagnosis not present

## 2017-10-16 DIAGNOSIS — M7989 Other specified soft tissue disorders: Secondary | ICD-10-CM | POA: Diagnosis not present

## 2017-10-16 DIAGNOSIS — E1122 Type 2 diabetes mellitus with diabetic chronic kidney disease: Secondary | ICD-10-CM | POA: Insufficient documentation

## 2017-10-16 LAB — URINALYSIS, ROUTINE W REFLEX MICROSCOPIC
BACTERIA UA: NONE SEEN
BILIRUBIN URINE: NEGATIVE
Hgb urine dipstick: NEGATIVE
KETONES UR: NEGATIVE mg/dL
Leukocytes, UA: NEGATIVE
NITRITE: NEGATIVE
PH: 5 (ref 5.0–8.0)
Protein, ur: 30 mg/dL — AB
SPECIFIC GRAVITY, URINE: 1.029 (ref 1.005–1.030)

## 2017-10-16 LAB — COMPREHENSIVE METABOLIC PANEL
ALK PHOS: 92 U/L (ref 38–126)
ALT: 25 U/L (ref 0–44)
ANION GAP: 7 (ref 5–15)
AST: 18 U/L (ref 15–41)
Albumin: 3.1 g/dL — ABNORMAL LOW (ref 3.5–5.0)
BILIRUBIN TOTAL: 0.6 mg/dL (ref 0.3–1.2)
BUN: 21 mg/dL (ref 8–23)
CALCIUM: 9.2 mg/dL (ref 8.9–10.3)
CO2: 27 mmol/L (ref 22–32)
Chloride: 104 mmol/L (ref 98–111)
Creatinine, Ser: 1.44 mg/dL — ABNORMAL HIGH (ref 0.61–1.24)
GFR calc Af Amer: 58 mL/min — ABNORMAL LOW (ref >60)
GFR calc non Af Amer: 50 mL/min — ABNORMAL LOW (ref >60)
Glucose, Bld: 276 mg/dL — ABNORMAL HIGH (ref 70–99)
POTASSIUM: 4.5 mmol/L (ref 3.5–5.1)
Sodium: 138 mmol/L (ref 135–145)
TOTAL PROTEIN: 6.7 g/dL (ref 6.5–8.1)

## 2017-10-16 LAB — CBC WITH DIFFERENTIAL/PLATELET
BASOS PCT: 0 %
Basophils Absolute: 0 10*3/uL (ref 0.0–0.1)
Eosinophils Absolute: 0.2 10*3/uL (ref 0.0–0.7)
Eosinophils Relative: 2 %
HEMATOCRIT: 38 % — AB (ref 39.0–52.0)
Hemoglobin: 13.1 g/dL (ref 13.0–17.0)
LYMPHS PCT: 30 %
Lymphs Abs: 2.3 10*3/uL (ref 0.7–4.0)
MCH: 32.7 pg (ref 26.0–34.0)
MCHC: 34.5 g/dL (ref 30.0–36.0)
MCV: 94.8 fL (ref 78.0–100.0)
MONO ABS: 1 10*3/uL (ref 0.1–1.0)
Monocytes Relative: 13 %
NEUTROS ABS: 4.1 10*3/uL (ref 1.7–7.7)
Neutrophils Relative %: 55 %
Platelets: 166 10*3/uL (ref 150–400)
RBC: 4.01 MIL/uL — ABNORMAL LOW (ref 4.22–5.81)
RDW: 13.9 % (ref 11.5–15.5)
WBC: 7.7 10*3/uL (ref 4.0–10.5)

## 2017-10-16 LAB — CBG MONITORING, ED: Glucose-Capillary: 297 mg/dL — ABNORMAL HIGH (ref 70–99)

## 2017-10-16 LAB — VALPROIC ACID LEVEL: Valproic Acid Lvl: 18 ug/mL — ABNORMAL LOW (ref 50.0–100.0)

## 2017-10-16 MED ORDER — SODIUM CHLORIDE 0.9 % IV SOLN
INTRAVENOUS | Status: DC
  Administered 2017-10-16: 11:00:00 via INTRAVENOUS

## 2017-10-16 NOTE — ED Notes (Signed)
Per Dr. Sabra Heck - patient needs crutches.  We attempted to get patient to ambulate and was unsuccessful.    After crutches, patient was able to walk.  Advised Dr. Sabra Heck.

## 2017-10-16 NOTE — ED Provider Notes (Signed)
[facility] EMERGENCY DEPARTMENT Provider Note   CSN: [id number] Arrival date & time: 10/16/17  9937     History   Chief Complaint Chief Complaint  Patient presents with  . Weakness    HPI Daniel [name] is a 62 y.o. male.  HPI  The patient is a obese 62 year old male who presents from home where he lives with family members after waking up this morning feeling generally weak, states that he tried to walk into the kitchen, was feeling very weak, poured himself a glass of juice but then he fell forward landing on his knees.  He reports that this weakness is generalized, nonfocal, persistent, he has been eating without difficulties and denies any diarrhea dysuria fevers chills nausea or vomiting.  He has been having slight difficulty walking on his right foot after undergoing a right foot transmetatarsal amputation secondary to right foot abscess and osteomyelitis.  He has had his wound care at the office, has been getting dressing changes, he has not had one recently.  He denies any foul smell or spreading redness or swelling of that foot.  The patient was able to get himself up off the floor this morning.  Got to his bed, called 911 for assistance due to severe weakness  Past Medical History:  Diagnosis Date  . ADHD (attention deficit hyperactivity disorder)   . Anxiety   . Chronic kidney disease   . Clotting disorder ([place])   . Depression   . Diabetes mellitus without complication ([place])   . Diabetes mellitus, type II ([place])   . Dizziness 03/17/2015  . GERD (gastroesophageal reflux disease)   . HIV disease ([place])   . HIV infection ([place])   . Liver disease   . OSA (obstructive sleep apnea) 07/25/2015   Uses CPAP regularly  . Peripheral vascular disease ([place])   . Ulcer     Patient Active Problem List   Diagnosis Date Noted  . S/P transmetatarsal amputation of foot, right ([place]) 10/09/2017  . Deep vein thrombosis (DVT) ([place])   . Osteomyelitis ([place])   . Hypothyroidism   .  Constipation   . History of DVT (deep vein thrombosis) 09/30/2017  . Morbid obesity due to excess calories ([place]) complicated by DM / hyperlipidemia 01/15/2017  . DOE (dyspnea on exertion) 01/13/2017  . Chronic migraine 09/17/2016  . Gait abnormality 09/17/2016  . Diabetic foot infection ([place]) 06/28/2016  . Fall 12/05/2015  . Toe ulcer, right ([place]) 09/19/2015  . Decreased pedal pulses 09/19/2015  . OSA on CPAP 09/05/2015  . Major depressive disorder, recurrent episode, moderate ([place]) 09/05/2015  . Low back pain 06/11/2015  . Abnormality of gait 06/11/2015  . Dizziness 03/17/2015  . Weakness 02/21/2015  . Chronic renal insufficiency, stage III (moderate) ([place]) 08/09/2014  . Insulin-requiring or dependent type II diabetes mellitus ([place]) 11/07/2013  . Hematuria 06/21/2013  . Hepatic steatosis 09/09/2010  . Human immunodeficiency virus (HIV) disease ([place]) 06/04/2006  . HERPES ZOSTER, UNCOMPLICATED 16/96/7893  . THROMBOPHLEBITIS NOS 06/04/2006  . GERD 06/04/2006  . ARTHRITIS, HAND 06/04/2006    Past Surgical History:  Procedure Laterality Date  . AMPUTATION Right 10/02/2017   Procedure: RIGHT TRANSMETATARSAL AMPUTATION;  Surgeon: Leandrew Koyanagi, MD;  Location: [facility];  Service: Orthopedics;  Laterality: Right;  . SMALL INTESTINE SURGERY    . STOMACH SURGERY    . TOE AMPUTATION Right 08/2016   right great toe        Home Medications    Prior to Admission medications  Medication Sig Start Date End Date Taking? Authorizing Provider  abacavir-dolutegravir-lamiVUDine (TRIUMEQ) 600-50-300 MG tablet Take 1 tablet by mouth daily. Patient taking differently: Take 1 tablet by mouth at bedtime.  06/09/17  Yes Comer, Okey Regal, MD  acetaminophen (TYLENOL) 500 MG tablet Take 1,000-1,500 mg by mouth every 8 (eight) hours as needed for moderate pain.    Yes [provider]  ALPRAZolam Duanne Moron) 1 MG tablet Take 1 mg by mouth 4 (four) times daily as needed for anxiety.    Yes [provider]  amoxicillin-clavulanate (AUGMENTIN) 500-125 MG tablet Take 1 tablet (500 mg total) by mouth 2 (two) times daily for 14 days. 10/05/17 10/19/17 Yes Eugenie Filler, MD  amphetamine-dextroamphetamine (ADDERALL) 30 MG tablet Take 30 mg by mouth 3 (three) times daily.   Yes [provider]  Brexpiprazole (REXULTI) 1 MG TABS Take 1 mg by mouth at bedtime.   Yes [provider]  diclofenac sodium (VOLTAREN) 1 % GEL APPLY 2 GRAMS TO EACH KNEE IN THE MORNING AND AT BEDTIME AND APPLY 1 GRAM TO EACH KNEE IN THE AFTERNOON. 05/16/17  Yes [name], MD  divalproex (DEPAKOTE ER) 500 MG 24 hr tablet TAKE 1 TABLET BY MOUTH EVERY NIGHT AT BEDTIME 09/15/17  Yes Marcial Pacas, MD  doxycycline (VIBRA-TABS) 100 MG tablet Take 1 tablet (100 mg total) by mouth every 12 (twelve) hours for 14 days. 10/05/17 10/19/17 Yes Eugenie Filler, MD  glucose blood (FREESTYLE PRECISION NEO TEST) test strip Used to check blood sugars twice daily. 07/28/17  Yes Renato Shin, MD  HYDROcodone-acetaminophen (NORCO/VICODIN) 5-325 MG tablet Take 1-2 tablets by mouth every 4 (four) hours as needed for moderate pain. 10/05/17  Yes Eugenie Filler, MD  insulin aspart (NOVOLOG FLEXPEN) 100 UNIT/ML FlexPen 10 units with breakfast, and 20 units with evening meal Patient taking differently: Inject 10-20 Units into the skin See admin instructions. 10 units with breakfast, and 20 units with evening meal 09/21/17  Yes Renato Shin, MD  Insulin Glargine (LANTUS SOLOSTAR) 100 UNIT/ML Solostar Pen Inject 150 Units into the skin every morning. 09/21/17  Yes Renato Shin, MD  Insulin Pen Needle (B-D ULTRAFINE III SHORT PEN) 31G X 8 MM MISC USE TO INJECT LANTUS AND NOVOLOG DAILY 10/12/17  Yes Renato Shin, MD  levothyroxine (SYNTHROID, LEVOTHROID) 50 MCG tablet Take 1 tablet (50 mcg total) by mouth at bedtime. 07/24/15  Yes [name], MD  LYRICA 100 MG capsule TAKE 1 CAPSULE BY MOUTH THREE TIMES DAILY 09/22/17  Yes  [name], MD  ondansetron (ZOFRAN) 8 MG tablet Take 8 mg by mouth every 8 (eight) hours as needed for nausea or vomiting.   Yes [provider]  pantoprazole (PROTONIX) 40 MG tablet Take 1 tablet (40 mg total) by mouth daily at 6 (six) AM. 10/06/17  Yes Eugenie Filler, MD  polyethylene glycol (MIRALAX / GLYCOLAX) packet Take 17 g by mouth 2 (two) times daily. 10/05/17  Yes Eugenie Filler, MD  protriptyline (VIVACTIL) 10 MG tablet Take 10 mg by mouth 3 (three) times daily.  01/30/16  Yes [provider]  ranitidine (ZANTAC) 150 MG tablet Take 150 mg by mouth daily as needed.    Yes [provider]  rivaroxaban (XARELTO) 20 MG TABS tablet Take 1 tablet (20 mg total) by mouth daily. 10/05/17  Yes Eugenie Filler, MD  senna-docusate (SENOKOT-S) 8.6-50 MG tablet Take 1 tablet by mouth 2 (two) times daily. 10/05/17  Yes [place] Silos,  Malachy Moan, MD  TRINTELLIX 20 MG TABS Take 20 mg by mouth at bedtime. 01/26/15  Yes [provider]  UNABLE TO FIND CPAP MACHINE with standard Aclaim nasal mask with humidifier. Set at 14 cwp Patient taking differently: CPAP MACHINE with standard Aclaim nasal mask with humidifier. Set at 4 cwp 04/12/13  Yes Daub, Loura Back, MD  zolpidem (AMBIEN) 10 MG tablet Take 10 mg by mouth at bedtime.    Yes [provider]    Family History Family History  Problem Relation Age of Onset  . Depression Brother   . Throat cancer Brother        half brother, never smoker  . COPD Mother   . Diabetes Neg Hx     Social History Social History   Tobacco Use  . Smoking status: Former Smoker    Packs/day: 0.10    Years: 10.00    Pack years: 1.00    Types: Cigars    Last attempt to quit: 08/09/2014    Years since quitting: 3.1  . Smokeless tobacco: Never Used  Substance Use Topics  . Alcohol use: No    Alcohol/week: 0.0 standard drinks  . Drug use: No     Allergies   Aspirin; Ibuprofen; Sustiva [efavirenz]; and  Nsaids   Review of Systems Review of Systems  All other systems reviewed and are negative.    Physical Exam Updated Vital Signs BP 105/72   Pulse 85   Temp 97.6 F (36.4 C) (Oral)   Resp 15   Ht 5' 11"  (1.803 m)   Wt 113.4 kg   SpO2 94%   BMI 34.87 kg/m   Physical Exam  Constitutional: He appears well-developed and well-nourished. No distress.  HENT:  Head: Normocephalic and atraumatic.  Mouth/Throat: Oropharynx is clear and moist. No oropharyngeal exudate.  Eyes: Pupils are equal, round, and reactive to light. Conjunctivae and EOM are normal. Right eye exhibits no discharge. Left eye exhibits no discharge. No scleral icterus.  Neck: Normal range of motion. Neck supple. No JVD present. No thyromegaly present.  Cardiovascular: Normal rate, regular rhythm, normal heart sounds and intact distal pulses. Exam reveals no gallop and no friction rub.  No murmur heard. Pulmonary/Chest: Effort normal and breath sounds normal. No respiratory distress. He has no wheezes. He has no rales.  Abdominal: Soft. Bowel sounds are normal. He exhibits no distension and no mass. There is no tenderness.  Musculoskeletal: Normal range of motion. He exhibits no edema or tenderness.  The incised wound of the right foot has slight gaping, small amount of serous material, no foul smell, erythema is spread to 1 inch of the incision dorsally.  Minimal edema of the right lower extremity  Lymphadenopathy:    He has no cervical adenopathy.  Neurological: He is alert. Coordination normal.  The patient is sleepy appearing but arousable and follows commands without difficulty in all 4 extremities including normal grips, able to straight leg raise bilaterally, he does have a right-sided facial droop which she states is consistent with a Bell's palsy that he suffered approximately 6 weeks ago.  Review of the medical record shows that this was corroborated in the notes.  Skin: Skin is warm and dry. No rash noted. No  erythema.  Psychiatric: He has a normal mood and affect. His behavior is normal.  Nursing note and vitals reviewed.    ED Treatments / Results  Labs (all labs ordered are listed, but only abnormal results are displayed) Labs Reviewed  CBC WITH DIFFERENTIAL/PLATELET - Abnormal; Notable for the following components:      Result Value   RBC 4.01 (*)    HCT 38.0 (*)    All other components within normal limits  COMPREHENSIVE METABOLIC PANEL - Abnormal; Notable for the following components:   Glucose, Bld 276 (*)    Creatinine, Ser 1.44 (*)    Albumin 3.1 (*)    GFR calc non Af Amer 50 (*)    GFR calc Af Amer 58 (*)    All other components within normal limits  VALPROIC ACID LEVEL - Abnormal; Notable for the following components:   Valproic Acid Lvl 18 (*)    All other components within normal limits  URINALYSIS, ROUTINE W REFLEX MICROSCOPIC - Abnormal; Notable for the following components:   Glucose, UA >=500 (*)    Protein, ur 30 (*)    All other components within normal limits  CBG MONITORING, ED - Abnormal; Notable for the following components:   Glucose-Capillary 297 (*)    All other components within normal limits    EKG EKG Interpretation  Date/Time:  Friday October 16 2017 10:08:44 EDT Ventricular Rate:  89 PR Interval:    QRS Duration: 102 QT Interval:  338 QTC Calculation: 412 R Axis:   38 Text Interpretation:  Sinus rhythm Low voltage, precordial leads since last tracing no significant change Confirmed by Noemi Chapel [phone]) on 10/16/2017 10:27:07 AM   Radiology Dg Chest Port 1 View  Result Date: 10/16/2017 CLINICAL DATA:  Postop foot infection EXAM: PORTABLE CHEST 1 VIEW COMPARISON:  Chest CT 09/28/2017 FINDINGS: Enlarged appearance of the heart, accentuated by mediastinal fat based on CT. Vascular pedicle widening. Low lung volumes. There is no edema, consolidation, effusion, or pneumothorax. IMPRESSION: Low volume chest without acute finding. Electronically  Signed   By: Monte Fantasia M.D.   On: 10/16/2017 11:08   Dg Foot Complete Right  Result Date: 10/16/2017 CLINICAL DATA:  Postoperative foot infection.  Diabetes. EXAM: RIGHT FOOT COMPLETE - 3+ VIEW COMPARISON:  09/30/2017. FINDINGS: Transmetatarsal amputation. No acute bony abnormality.Soft tissue swelling noted diffusely about the stump. No radiopaque foreign body noted. IMPRESSION: Transmetatarsal amputation. No acute bony abnormality. Diffuse soft tissue swelling noted about the stump. Soft tissue infection cannot be excluded. Electronically Signed   By: Marcello Moores  Register   On: 10/16/2017 11:09    Procedures Procedures (including critical care time)  Medications Ordered in ED Medications  0.9 %  sodium chloride infusion ( Intravenous New Bag/Given 10/16/17 1120)     Initial Impression / Assessment and Plan / ED Course  I have reviewed the triage vital signs and the nursing notes.  Pertinent labs & imaging results that were available during my care of the patient were reviewed by me and considered in my medical decision making (see chart for details).  Clinical Course as of Oct 16 1516  Fri Oct 16, 2017  1419 No acute findings on labs,   [BM]  1510 I have given the patient 1 L of IV fluids, he is awake alert, he was able to stand at the bedside, he was told not to ambulate on his foot by his surgeon, the patient reports he does not have a walker or wheelchair or crutches and when offered any of these 3 he requested crutches.  He has walked with him before, he has good strength in his legs and his arms, he has his mental status with him and no focal neurologic deficits to suggest the  need for further neurologic work-up.  Urinalysis pending, anticipate discharge if negative.   [BM]  1510 I have personally applied his dressing, Ace wrap and   [BM]  1510 Assisted him with standing   [BM]    Clinical Course User Index [BM] Noemi Chapel, MD   The patient is generally weak, he is able  to follow commands and answer my questions appropriately, the cause of his generalized weakness is unclear and thus lab work-up will ensue.  He is a diabetic and his blood sugar on the ambulance was in the mid 200s.  Will check labs, give some fluids, I will take a picture of his wound, discussed with the surgeon, he is currently on both doxycycline and Augmentin, it does not appear to be worsening.  Will obtain an x-ray of the foot to rule out deeper tissue or bony infection.    No leukocytosis, patient states that there is no spreading redness of the foot, serosanguineous drainage present but no purulence.  Dressing was rewrapped, renal function is at baseline based on prior record review.  No fever, no tachycardia, awake and alert, given IV fluids and feeling better.  Sterile dressing placed  Final Clinical Impressions(s) / ED Diagnoses   Final diagnoses:  Generalized weakness      Noemi Chapel, MD 10/16/17 [phone]

## 2017-10-16 NOTE — ED Triage Notes (Signed)
Patient brought in by EMS with complaint of generalized weakness starting this morning. Patient states he had partial right foot amputation 2 weeks ago. States during triage he started having upper right leg pain "like a blood clot."

## 2017-10-16 NOTE — Discharge Instructions (Signed)
Your testing today shows that your blood sugar is slightly elevated but there was no other acute findings Please finish the antibiotics that you were given for your postoperative treatment including doxycycline and Augmentin If you should develop fever increased pain swelling drainage foul smell or spreading redness you should be seen immediately in the emergency department Please call your surgeon today to make a postoperative follow-up, you should be seen within 3 days either by your family doctor or your surgeon.

## 2017-10-20 ENCOUNTER — Ambulatory Visit: Payer: PPO | Admitting: Sports Medicine

## 2017-10-21 ENCOUNTER — Other Ambulatory Visit: Payer: Self-pay

## 2017-10-21 ENCOUNTER — Encounter (INDEPENDENT_AMBULATORY_CARE_PROVIDER_SITE_OTHER): Payer: Self-pay | Admitting: Orthopaedic Surgery

## 2017-10-21 ENCOUNTER — Ambulatory Visit (INDEPENDENT_AMBULATORY_CARE_PROVIDER_SITE_OTHER): Payer: PPO | Admitting: Orthopaedic Surgery

## 2017-10-21 ENCOUNTER — Other Ambulatory Visit: Payer: Self-pay | Admitting: Family Medicine

## 2017-10-21 ENCOUNTER — Telehealth (INDEPENDENT_AMBULATORY_CARE_PROVIDER_SITE_OTHER): Payer: Self-pay | Admitting: Radiology

## 2017-10-21 ENCOUNTER — Telehealth (INDEPENDENT_AMBULATORY_CARE_PROVIDER_SITE_OTHER): Payer: Self-pay | Admitting: Orthopaedic Surgery

## 2017-10-21 DIAGNOSIS — M792 Neuralgia and neuritis, unspecified: Secondary | ICD-10-CM

## 2017-10-21 DIAGNOSIS — I82409 Acute embolism and thrombosis of unspecified deep veins of unspecified lower extremity: Secondary | ICD-10-CM

## 2017-10-21 DIAGNOSIS — Z89431 Acquired absence of right foot: Secondary | ICD-10-CM

## 2017-10-21 MED ORDER — DOXYCYCLINE HYCLATE 100 MG PO TABS: 100.0000 mg | ORAL_TABLET | Freq: Two times a day (BID) | ORAL | 0 refills | Status: DC

## 2017-10-21 NOTE — Progress Notes (Signed)
Daniel [name] is approximately 3 weeks status post right transmetatarsal amputation.  He has not had any wound care provided to him.  [facility] not [facility] the patient since we put in the order.  He presents today for follow-up.  His surgical wound has some superficial dehiscence but there is good granulation tissue underneath.  There is no evidence of infection or drainage.  He does have dependent edema.  Today we remove the sutures and reapplied the wet-to-dry dressings.  I stressed the importance of strict elevation.  We will reengage [facility] to make sure that they go out to his house for dressing changes.  Recheck in 2 weeks.  Continue doxycycline.

## 2017-10-21 NOTE — Telephone Encounter (Signed)
Lyrica and Xarelto refilled.  However please verify that he is taking 20 mg dose of Xarelto, as it was increased from 15 mg when he was in the hospital.  There does appear to be a prescription from August 12 for the same dose, should only be on 1 pill/day, 20 mg.  Let me know if there are questions

## 2017-10-21 NOTE — Telephone Encounter (Signed)
Xarelto refill; LRF 10/05/17; # 30; RF x 1 Lyrical refill; LRF 09/22/17; # 90; RF 0 Last OV: 09/03/17 PCP: Dr. Carlota Raspberry Pharmacy: [facility]; [place], [place]

## 2017-10-21 NOTE — Telephone Encounter (Signed)
Patient stated at check out that he has caretakers coming to his house and they told him he might benefit being in a rehab center.  He wanted to know what [name] thought about that.  CB#[phone].  Thank you.

## 2017-10-21 NOTE — Patient Outreach (Signed)
[facility]) Care Management  10/21/2017  Daniel [name] [facility] [birth year] [id number]   Referral Date:10/20/17 Referral Source: [facility] Referral Reason: Patient concerned about caring for self   Outreach Attempt: Spoke with patient.  He is able to verify HIPAA. Discussed with patient reason for referral.  Patient states that he is going to surgeon [name] today to discuss status of his right foot.  He states he has some bloody drainage to wound.  He states that he has some problems with his non-weightbearing status. He states there is no way he can care for himself.  Discussed possible rehab and having a discussion with physician today.  He states that he is ok with this if he continues non-weightbearing status in order to get his foot healed.  According to physician records patient is to have wet to dry dressing changes twice a day to wound site.  Patient states he has not had any dressing changes.  Will call [facility].    Patient lives in the home with aunt and brother.  He does not have assistance with care from his brother or aunt as they have their own health issues.    Patient admits to DM, PVD, HIV, CKD, and some problems with depression. Patient takes his medications as prescribed and offers no concerns.  Patient has had 3 ED/ hospital visits this month and is high risk for readmission.  Patient reports that he has not checked his sugars but has the type of meter that constantly monitors sugars and is waiting on new needles that will arrive anytime per patient.     Update: [place] and patient only has order for PT and OT.  Telephone call back to patient to inform of this status. Patient will discussed with physician today on visit.  Telephone call to Dr. Phoebe Sharps office to inform of dressing status.  CM left voice message.    Plan: RN CM will refer patient to community nurse for care coordination and assessment as patient is high risk for readmit and multiple  falls.     Jone Baseman, RN, MSN [facility] Care Management Care Management Coordinator Direct Line [phone] Cell [phone] Toll Free: [phone]  Fax: [phone]'

## 2017-10-21 NOTE — Telephone Encounter (Signed)
Deonna with [facility], states patient is concerned about his care, per last OV he was supposed to have home health coming out twice a week but no one has come. She did not see a nurse order from [facility] so therefore has had no dressing changes. She just wanted to make someone aware, she knows of his appt scheduled for today. If you have any questions Deonnas # [phone]

## 2017-10-21 NOTE — Telephone Encounter (Signed)
Refill

## 2017-10-21 NOTE — Telephone Encounter (Signed)
Nurse from [facility] called, patient is asking if he can get the foot wet, I advised no.  Patient also c/o odor now.  He has appt today and we can re eval.

## 2017-10-21 NOTE — Telephone Encounter (Signed)
FYI - Patient has appointment today at 2:15pm

## 2017-10-22 ENCOUNTER — Encounter (INDEPENDENT_AMBULATORY_CARE_PROVIDER_SITE_OTHER): Payer: Self-pay | Admitting: Radiology

## 2017-10-22 ENCOUNTER — Other Ambulatory Visit: Payer: Self-pay | Admitting: *Deleted

## 2017-10-22 ENCOUNTER — Ambulatory Visit (INDEPENDENT_AMBULATORY_CARE_PROVIDER_SITE_OTHER): Payer: PPO | Admitting: Orthopaedic Surgery

## 2017-10-22 ENCOUNTER — Telehealth (INDEPENDENT_AMBULATORY_CARE_PROVIDER_SITE_OTHER): Payer: Self-pay | Admitting: Radiology

## 2017-10-22 ENCOUNTER — Telehealth (INDEPENDENT_AMBULATORY_CARE_PROVIDER_SITE_OTHER): Payer: Self-pay | Admitting: Orthopaedic Surgery

## 2017-10-22 ENCOUNTER — Encounter: Payer: Self-pay | Admitting: *Deleted

## 2017-10-22 NOTE — Telephone Encounter (Signed)
Yes on PT.

## 2017-10-22 NOTE — Telephone Encounter (Signed)
IC patient and LM advising.

## 2017-10-22 NOTE — Telephone Encounter (Signed)
Info faxed to [facility]

## 2017-10-22 NOTE — Telephone Encounter (Signed)
Debbie, PT at [facility] had eval today with patient and is requesting to see him 2 x for 2 weeks. She also said patient had a fall at 3 a.m. this morning but there were no injuries, patient continues to be noncompliant with weight bearing status so she will continue with transfers only to maintain this.   Debbies # [phone]

## 2017-10-22 NOTE — Telephone Encounter (Signed)
Please advise 

## 2017-10-22 NOTE — Telephone Encounter (Signed)
See message.

## 2017-10-22 NOTE — Patient Outreach (Signed)
Referral received from telephonic RN for community care management, RN CM called pt to schedule initial home visit, no answer to telephone [phone] and no option to leave voicemail,  RN CM mailed unsuccessful outreach letter to patient's home.  PLAN Outreach in 3-4 business days  Jacqlyn Larsen [facility], [place] [phone]

## 2017-10-22 NOTE — Telephone Encounter (Signed)
Fine by me but in order to go to one he would have to be admitted to hospital first or go to ER and state that he is unsafe to be at home

## 2017-10-23 NOTE — Telephone Encounter (Signed)
Called Debbie back no answer, LMOM with details. See message below.

## 2017-10-27 DIAGNOSIS — E11628 Type 2 diabetes mellitus with other skin complications: Secondary | ICD-10-CM | POA: Diagnosis not present

## 2017-10-27 DIAGNOSIS — G4733 Obstructive sleep apnea (adult) (pediatric): Secondary | ICD-10-CM | POA: Diagnosis not present

## 2017-10-27 DIAGNOSIS — Z4781 Encounter for orthopedic aftercare following surgical amputation: Secondary | ICD-10-CM | POA: Diagnosis not present

## 2017-10-27 DIAGNOSIS — Z86718 Personal history of other venous thrombosis and embolism: Secondary | ICD-10-CM | POA: Diagnosis not present

## 2017-10-27 DIAGNOSIS — E039 Hypothyroidism, unspecified: Secondary | ICD-10-CM | POA: Diagnosis not present

## 2017-10-27 DIAGNOSIS — K5900 Constipation, unspecified: Secondary | ICD-10-CM | POA: Diagnosis not present

## 2017-10-27 DIAGNOSIS — Z89421 Acquired absence of other right toe(s): Secondary | ICD-10-CM | POA: Diagnosis not present

## 2017-10-27 DIAGNOSIS — M86171 Other acute osteomyelitis, right ankle and foot: Secondary | ICD-10-CM | POA: Diagnosis not present

## 2017-10-28 ENCOUNTER — Inpatient Hospital Stay: Payer: PPO | Admitting: Family Medicine

## 2017-10-29 ENCOUNTER — Other Ambulatory Visit: Payer: Self-pay

## 2017-10-29 NOTE — Patient Outreach (Signed)
[facility]) Care Management  10/29/2017  Daniel [name] [facility] 1955/05/10 [id number]    Outreach call placed to Daniel [name]. No answer at listed number. RN CM left a HIPAA compliant voice message requesting a return call.   PLAN RN CM will follow up and attempt to schedule initial home visit in 3-4 business days.   [place] [phone]

## 2017-11-02 ENCOUNTER — Other Ambulatory Visit: Payer: BLUE CROSS/BLUE SHIELD

## 2017-11-02 ENCOUNTER — Other Ambulatory Visit: Payer: Self-pay

## 2017-11-02 DIAGNOSIS — Z4781 Encounter for orthopedic aftercare following surgical amputation: Secondary | ICD-10-CM | POA: Diagnosis not present

## 2017-11-02 DIAGNOSIS — Z89421 Acquired absence of other right toe(s): Secondary | ICD-10-CM | POA: Diagnosis not present

## 2017-11-02 DIAGNOSIS — K5900 Constipation, unspecified: Secondary | ICD-10-CM | POA: Diagnosis not present

## 2017-11-02 DIAGNOSIS — E11628 Type 2 diabetes mellitus with other skin complications: Secondary | ICD-10-CM | POA: Diagnosis not present

## 2017-11-02 DIAGNOSIS — E039 Hypothyroidism, unspecified: Secondary | ICD-10-CM | POA: Diagnosis not present

## 2017-11-02 DIAGNOSIS — M86171 Other acute osteomyelitis, right ankle and foot: Secondary | ICD-10-CM | POA: Diagnosis not present

## 2017-11-02 DIAGNOSIS — Z86718 Personal history of other venous thrombosis and embolism: Secondary | ICD-10-CM | POA: Diagnosis not present

## 2017-11-02 NOTE — Patient Outreach (Signed)
[facility]) Care Management  11/02/2017  Daniel [name] [facility] 02/11/1956 [id number]    2nd Outreach Attempt RN CM attempted to contact [name]. Outreach unsuccessful. Left HIPAA compliant voice message requesting a return call.   PLAN RN CM will attempt to contact member within 3-4 business days.   [place] [phone]

## 2017-11-03 ENCOUNTER — Other Ambulatory Visit: Payer: PPO

## 2017-11-03 DIAGNOSIS — Z86718 Personal history of other venous thrombosis and embolism: Secondary | ICD-10-CM | POA: Diagnosis not present

## 2017-11-03 DIAGNOSIS — E11628 Type 2 diabetes mellitus with other skin complications: Secondary | ICD-10-CM | POA: Diagnosis not present

## 2017-11-03 DIAGNOSIS — Z4781 Encounter for orthopedic aftercare following surgical amputation: Secondary | ICD-10-CM | POA: Diagnosis not present

## 2017-11-03 DIAGNOSIS — K5900 Constipation, unspecified: Secondary | ICD-10-CM | POA: Diagnosis not present

## 2017-11-03 DIAGNOSIS — E039 Hypothyroidism, unspecified: Secondary | ICD-10-CM | POA: Diagnosis not present

## 2017-11-03 DIAGNOSIS — Z89421 Acquired absence of other right toe(s): Secondary | ICD-10-CM | POA: Diagnosis not present

## 2017-11-03 DIAGNOSIS — M86171 Other acute osteomyelitis, right ankle and foot: Secondary | ICD-10-CM | POA: Diagnosis not present

## 2017-11-04 ENCOUNTER — Other Ambulatory Visit (HOSPITAL_COMMUNITY)
Admission: RE | Admit: 2017-11-04 | Discharge: 2017-11-04 | Disposition: A | Discharge status: Home / Self Care | Payer: PPO | Source: Ambulatory Visit | Attending: Internal Medicine | Admitting: Internal Medicine

## 2017-11-04 ENCOUNTER — Other Ambulatory Visit: Payer: PPO

## 2017-11-04 ENCOUNTER — Encounter (INDEPENDENT_AMBULATORY_CARE_PROVIDER_SITE_OTHER): Payer: Self-pay | Admitting: Orthopaedic Surgery

## 2017-11-04 ENCOUNTER — Other Ambulatory Visit: Payer: Self-pay

## 2017-11-04 ENCOUNTER — Ambulatory Visit (INDEPENDENT_AMBULATORY_CARE_PROVIDER_SITE_OTHER): Payer: PPO | Admitting: Orthopaedic Surgery

## 2017-11-04 DIAGNOSIS — Z113 Encounter for screening for infections with a predominantly sexual mode of transmission: Secondary | ICD-10-CM

## 2017-11-04 DIAGNOSIS — B20 Human immunodeficiency virus [HIV] disease: Secondary | ICD-10-CM

## 2017-11-04 DIAGNOSIS — E785 Hyperlipidemia, unspecified: Secondary | ICD-10-CM

## 2017-11-04 DIAGNOSIS — Z89431 Acquired absence of right foot: Secondary | ICD-10-CM

## 2017-11-04 MED ORDER — DOXYCYCLINE HYCLATE 100 MG PO TABS: 100.0000 mg | ORAL_TABLET | Freq: Two times a day (BID) | ORAL | 0 refills | Status: DC

## 2017-11-04 NOTE — Progress Notes (Signed)
Post-Op Visit Note   Patient: Daniel [name]           Date of Birth: [birth year]           MRN: [id number] Visit Date: 11/04/2017 PCP: [name], MD   Assessment & Plan:  Chief Complaint:  Chief Complaint  Patient presents with  . Right Foot - Pain, Follow-up   Visit Diagnoses:  1. S/P transmetatarsal amputation of foot, right [facility])     Plan: Patient is a 62 year old gentleman who presents to our clinic today approximately 1 month out right transmetatarsal amputation, date of surgery 10/02/2017.  [facility] has been coming out to his house twice a week for wet-to-dry dressing changes over the past 2 weeks.  He has been on doxycycline.  He has been elevating for swelling.  No fevers, chills or any other systemic symptoms.  At this point continue with wet-to-dry dressings twice a day and elevation.  Continue doxycycline.  Follow-up in 3 weeks for another wound check.  Follow-Up Instructions: Return in about 3 weeks (around 11/25/2017).   Orders:  No orders of the defined types were placed in this encounter.  No orders of the defined types were placed in this encounter.   Imaging: No results found.  PMFS History: Patient Active Problem List   Diagnosis Date Noted  . S/P transmetatarsal amputation of foot, right ([place]) 10/09/2017  . Deep vein thrombosis (DVT) ([place])   . Osteomyelitis ([place])   . Hypothyroidism   . Constipation   . History of DVT (deep vein thrombosis) 09/30/2017  . Morbid obesity due to excess calories ([place]) complicated by DM / hyperlipidemia 01/15/2017  . DOE (dyspnea on exertion) 01/13/2017  . Chronic migraine 09/17/2016  . Gait abnormality 09/17/2016  . Diabetic foot infection ([place]) 06/28/2016  . Fall 12/05/2015  . Toe ulcer, right ([place]) 09/19/2015  . Decreased pedal pulses 09/19/2015  . OSA on CPAP 09/05/2015  . Major depressive disorder, recurrent episode, moderate ([place]) 09/05/2015  . Low back pain 06/11/2015  . Abnormality of gait  06/11/2015  . Dizziness 03/17/2015  . Weakness 02/21/2015  . Chronic renal insufficiency, stage III (moderate) ([place]) 08/09/2014  . Insulin-requiring or dependent type II diabetes mellitus ([place]) 11/07/2013  . Hematuria 06/21/2013  . Hepatic steatosis 09/09/2010  . Human immunodeficiency virus (HIV) disease ([place]) 06/04/2006  . HERPES ZOSTER, UNCOMPLICATED 24/40/1027  . THROMBOPHLEBITIS NOS 06/04/2006  . GERD 06/04/2006  . ARTHRITIS, HAND 06/04/2006   Past Medical History:  Diagnosis Date  . ADHD (attention deficit hyperactivity disorder)   . Anxiety   . Chronic kidney disease   . Clotting disorder ([place])   . Depression   . Diabetes mellitus without complication ([place])   . Diabetes mellitus, type II ([place])   . Dizziness 03/17/2015  . GERD (gastroesophageal reflux disease)   . HIV disease ([place])   . HIV infection ([place])   . Liver disease   . OSA (obstructive sleep apnea) 07/25/2015   Uses CPAP regularly  . Peripheral vascular disease ([place])   . Ulcer     Family History  Problem Relation Age of Onset  . Depression Brother   . Throat cancer Brother        half brother, never smoker  . COPD Mother   . Diabetes Neg Hx     Past Surgical History:  Procedure Laterality Date  . AMPUTATION Right 10/02/2017   Procedure: RIGHT TRANSMETATARSAL AMPUTATION;  Surgeon: Leandrew Koyanagi, MD;  Location: [place];  Service: Orthopedics;  Laterality: Right;  . SMALL INTESTINE SURGERY    . STOMACH SURGERY    . TOE AMPUTATION Right 08/2016   right great toe   Social History   Occupational History    Comment: DISABILITY  Tobacco Use  . Smoking status: Former Smoker    Packs/day: 0.10    Years: 10.00    Pack years: 1.00    Types: Cigars    Last attempt to quit: 08/09/2014    Years since quitting: 3.2  . Smokeless tobacco: Never Used  Substance and Sexual Activity  . Alcohol use: No    Alcohol/week: 0.0 standard drinks  . Drug use: No  . Sexual activity: Not Currently    Partners: Male     Comment: pt. declined condoms

## 2017-11-05 ENCOUNTER — Ambulatory Visit: Payer: Self-pay

## 2017-11-06 ENCOUNTER — Other Ambulatory Visit: Payer: Self-pay | Admitting: Endocrinology

## 2017-11-06 LAB — COMPLETE METABOLIC PANEL WITH GFR
AG Ratio: 1.3 (calc) (ref 1.0–2.5)
ALBUMIN MSPROF: 4 g/dL (ref 3.6–5.1)
ALT: 21 U/L (ref 9–46)
AST: 23 U/L (ref 10–35)
Alkaline phosphatase (APISO): 121 U/L — ABNORMAL HIGH (ref 40–115)
BUN / CREAT RATIO: 13 (calc) (ref 6–22)
BUN: 20 mg/dL (ref 7–25)
CALCIUM: 9.7 mg/dL (ref 8.6–10.3)
CHLORIDE: 103 mmol/L (ref 98–110)
CO2: 26 mmol/L (ref 20–32)
Creat: 1.51 mg/dL — ABNORMAL HIGH (ref 0.70–1.25)
GFR, EST NON AFRICAN AMERICAN: 48 mL/min/{1.73_m2} — AB (ref >=60)
GFR, Est African American: 56 mL/min/{1.73_m2} — ABNORMAL LOW (ref >=60)
GLOBULIN: 3.1 g/dL (ref 1.9–3.7)
Glucose, Bld: 60 mg/dL — ABNORMAL LOW (ref 65–99)
Potassium: 4.2 mmol/L (ref 3.5–5.3)
SODIUM: 139 mmol/L (ref 135–146)
Total Bilirubin: 0.7 mg/dL (ref 0.2–1.2)
Total Protein: 7.1 g/dL (ref 6.1–8.1)

## 2017-11-06 LAB — CBC WITH DIFFERENTIAL/PLATELET
BASOS PCT: 0.6 %
Basophils Absolute: 52 cells/uL (ref 0–200)
Eosinophils Absolute: 189 cells/uL (ref 15–500)
Eosinophils Relative: 2.2 %
HEMATOCRIT: 44.3 % (ref 38.5–50.0)
HEMOGLOBIN: 15.4 g/dL (ref 13.2–17.1)
LYMPHS ABS: 2872 {cells}/uL (ref 850–3900)
MCH: 31.8 pg (ref 27.0–33.0)
MCHC: 34.8 g/dL (ref 32.0–36.0)
MCV: 91.5 fL (ref 80.0–100.0)
MPV: 9.3 fL (ref 7.5–12.5)
Monocytes Relative: 10.1 %
NEUTROS ABS: 4618 {cells}/uL (ref 1500–7800)
Neutrophils Relative %: 53.7 %
Platelets: 201 10*3/uL (ref 140–400)
RBC: 4.84 10*6/uL (ref 4.20–5.80)
RDW: 14.9 % (ref 11.0–15.0)
Total Lymphocyte: 33.4 %
WBC mixed population: 869 cells/uL (ref 200–950)
WBC: 8.6 10*3/uL (ref 3.8–10.8)

## 2017-11-06 LAB — URINE CYTOLOGY ANCILLARY ONLY
CHLAMYDIA, DNA PROBE: NEGATIVE
Neisseria Gonorrhea: NEGATIVE

## 2017-11-06 LAB — T-HELPER CELL (CD4) - (RCID CLINIC ONLY)
CD4 T CELL HELPER: 31 % — AB (ref 33–55)
CD4 T Cell Abs: 900 /uL (ref 400–2700)

## 2017-11-06 LAB — HIV-1 RNA QUANT-NO REFLEX-BLD
HIV 1 RNA QUANT: DETECTED {copies}/mL — AB
HIV-1 RNA QUANT, LOG: DETECTED {Log_copies}/mL — AB

## 2017-11-06 LAB — RPR: RPR Ser Ql: NONREACTIVE

## 2017-11-09 DIAGNOSIS — E039 Hypothyroidism, unspecified: Secondary | ICD-10-CM | POA: Diagnosis not present

## 2017-11-09 DIAGNOSIS — E11628 Type 2 diabetes mellitus with other skin complications: Secondary | ICD-10-CM | POA: Diagnosis not present

## 2017-11-09 DIAGNOSIS — M86171 Other acute osteomyelitis, right ankle and foot: Secondary | ICD-10-CM | POA: Diagnosis not present

## 2017-11-09 DIAGNOSIS — Z4781 Encounter for orthopedic aftercare following surgical amputation: Secondary | ICD-10-CM | POA: Diagnosis not present

## 2017-11-09 DIAGNOSIS — Z86718 Personal history of other venous thrombosis and embolism: Secondary | ICD-10-CM | POA: Diagnosis not present

## 2017-11-09 DIAGNOSIS — K5900 Constipation, unspecified: Secondary | ICD-10-CM | POA: Diagnosis not present

## 2017-11-09 DIAGNOSIS — Z89421 Acquired absence of other right toe(s): Secondary | ICD-10-CM | POA: Diagnosis not present

## 2017-11-12 ENCOUNTER — Other Ambulatory Visit: Payer: Self-pay

## 2017-11-12 NOTE — Patient Outreach (Signed)
[facility]) Care Management  11/12/2017  [place] 20-Dec-1955 [id number]   RNCM contacted patient for telephonic outreach. Outreach unsuccessful. Left HIPAA compliant voice message requesting a return call.   PLAN Will follow up in 3-4 business days.   [place] [phone]

## 2017-11-16 ENCOUNTER — Ambulatory Visit (INDEPENDENT_AMBULATORY_CARE_PROVIDER_SITE_OTHER): Payer: PPO | Admitting: Internal Medicine

## 2017-11-16 ENCOUNTER — Other Ambulatory Visit: Payer: Self-pay | Admitting: *Deleted

## 2017-11-16 ENCOUNTER — Encounter: Payer: Self-pay | Admitting: Internal Medicine

## 2017-11-16 ENCOUNTER — Other Ambulatory Visit: Payer: Self-pay

## 2017-11-16 ENCOUNTER — Ambulatory Visit (INDEPENDENT_AMBULATORY_CARE_PROVIDER_SITE_OTHER): Payer: PPO | Admitting: Family Medicine

## 2017-11-16 ENCOUNTER — Encounter: Payer: Self-pay | Admitting: Family Medicine

## 2017-11-16 VITALS — BP 114/70 | HR 99 | Temp 97.9°F | Ht 71.0 in | Wt 252.0 lb

## 2017-11-16 VITALS — BP 110/75 | HR 96 | Temp 98.1°F | Wt 252.0 lb

## 2017-11-16 DIAGNOSIS — Z23 Encounter for immunization: Secondary | ICD-10-CM | POA: Diagnosis not present

## 2017-11-16 DIAGNOSIS — Z7189 Other specified counseling: Secondary | ICD-10-CM

## 2017-11-16 DIAGNOSIS — M86471 Chronic osteomyelitis with draining sinus, right ankle and foot: Secondary | ICD-10-CM

## 2017-11-16 DIAGNOSIS — Z86718 Personal history of other venous thrombosis and embolism: Secondary | ICD-10-CM

## 2017-11-16 DIAGNOSIS — M792 Neuralgia and neuritis, unspecified: Secondary | ICD-10-CM

## 2017-11-16 DIAGNOSIS — B20 Human immunodeficiency virus [HIV] disease: Secondary | ICD-10-CM | POA: Diagnosis not present

## 2017-11-16 DIAGNOSIS — Z7185 Encounter for immunization safety counseling: Secondary | ICD-10-CM

## 2017-11-16 MED ORDER — PREGABALIN 150 MG PO CAPS: 150.0000 mg | ORAL_CAPSULE | Freq: Three times a day (TID) | ORAL | 2 refills | Status: DC

## 2017-11-16 NOTE — Assessment & Plan Note (Signed)
He has been treated with source control from the surgery.  He has completed postop "mop up" antibiotics and I will have him stop all his antibiotics at this time as there is no indication for further treatment with no current infection.

## 2017-11-16 NOTE — Patient Instructions (Addendum)
For neuropathic pain, we can try higher dose of Lyrica for now, but as we discussed there is an increased risk of side effects over 334m per day. We can try 1532m3 times a day (as you have been tolerating the 400 mg total per day)  I expect the 3 times per day dosing may actually be more beneficial to keep symptoms from returning during the middle the day.  Please watch for any lightheadedness, dizziness or worsening side effects with Lyrica.  If those occur let me know and we can look into other options.  Follow-up with Dr. YaKrista Blues planned, but we can adjust doses prior to that visit if needed.  Thank you for coming in today.  If you have lab work done today you will be contacted with your lab results within the next 2 weeks.  If you have not heard from usKoreahen please contact usKoreaThe fastest way to get your results is to register for My Chart.   IF you received an x-ray today, you will receive an invoice from [facility]. Please contact [facility] at [phone] questions or concerns regarding your invoice.   IF you received labwork today, you will receive an invoice from [facility] at [phone] questions or concerns regarding your invoice.   Our billing staff will not be able to assist you with questions regarding bills from these companies.  You will be contacted with the lab results as soon as they are available. The fastest way to get your results is to activate your My Chart account. Instructions are located on the last page of this paperwork. If you have not heard from usKoreaegarding the results in 2 weeks, please contact this office.

## 2017-11-16 NOTE — Progress Notes (Signed)
I,Arielle J Pollard,acting as a scribe for [name], MD.,have documented all relevant documentation on the behalf of [name], MD,as directed by  [name], MD while in the presence of [name], MD. 11/16/2017  3:59 PM   Subjective:    Patient ID: Daniel [name], male    DOB: 07/15/1955, 62 y.o.   MRN: [id number]   Chief Complaint  Patient presents with  . Medication Refill    requesting Lyrica dose increase   . PHQ9=10    HPI Daniel [name] is a 62 y.o. male who presents to Primary Care at [facility] [place] is here to discuss peripheral neuropathy and med adjustment.   Hospitalized in August for osteomyelitis after diabetes foot infection. Had a transmetatarsal amputation of of foot on 10/02/17  Was treated with IV antibiotics followed by infectious disease. And was seen by ID today. He was seen by orthopedics 8/04/04/17 and wound care was arranged with home PT as well. He has home health care. He is no longer on antibiotics.   Peripheral Neuropathy Prescribed Lyrica 100 mg TID which had been increased from 75 mg last year due to dizziness at that time.  Neurology: [name] We did discuss following with [name] regarding foot neuropathy when seen in January. Has not seen her recently. He notes that the Lyrica has not been as effective recently.  He meets with [name] on November 26. He has pain in the morning and then he takes 200 mg. There's a recurrence of pain in the afternoon and is worst in the evening. Over the last month, he has taken an additional 200 mg at night.(280m BID). He notes that this has helped his symptoms. He took 200 mg this morning, but he still has burning pain in his feet. He denies being light-headed, dizzy, or unsteady with this dosing of 2060mBID, but does have some return of symptoms during the day.   H/o DVT See previous hospitalization Xarelto adjusted to 20 mg daily. Tolerating current dose without new bleeding.   Patient Active Problem  List   Diagnosis Date Noted  . S/P transmetatarsal amputation of foot, right (HCHamberg08/16/2019  . Deep vein thrombosis (DVT) (HCScranton  . Osteomyelitis (HCPalmer  . Hypothyroidism   . Constipation   . History of DVT (deep vein thrombosis) 09/30/2017  . Morbid obesity due to excess calories (HCEast Vandergriftcomplicated by DM / hyperlipidemia 01/15/2017  . DOE (dyspnea on exertion) 01/13/2017  . Chronic migraine 09/17/2016  . Gait abnormality 09/17/2016  . Diabetic foot infection (HCTwain Harte05/06/2016  . Fall 12/05/2015  . Toe ulcer, right (HCDe Witt07/26/2017  . Decreased pedal pulses 09/19/2015  . OSA on CPAP 09/05/2015  . Major depressive disorder, recurrent episode, moderate (HCOntario07/01/2016  . Low back pain 06/11/2015  . Abnormality of gait 06/11/2015  . Dizziness 03/17/2015  . Weakness 02/21/2015  . Chronic renal insufficiency, stage III (moderate) (HCFloraville06/15/2016  . Insulin-requiring or dependent type II diabetes mellitus (HCDuncan09/14/2015  . Hematuria 06/21/2013  . Hepatic steatosis 09/09/2010  . Human immunodeficiency virus (HIV) disease (HCSpring Valley04/11/2006  . HERPES ZOSTER, UNCOMPLICATED 0413/24/4010. THROMBOPHLEBITIS NOS 06/04/2006  . GERD 06/04/2006  . ARTHRITIS, HAND 06/04/2006   Past Medical History:  Diagnosis Date  . ADHD (attention deficit hyperactivity disorder)   . Anxiety   . Chronic kidney disease   . Clotting disorder (HCBunker  . Depression   . Diabetes mellitus without complication (HCCromberg  .  Diabetes mellitus, type II ([place])   . Dizziness 03/17/2015  . GERD (gastroesophageal reflux disease)   . HIV disease ([place])   . HIV infection ([place])   . Liver disease   . OSA (obstructive sleep apnea) 07/25/2015   Uses CPAP regularly  . Peripheral vascular disease ([place])   . Ulcer    Past Surgical History:  Procedure Laterality Date  . AMPUTATION Right 10/02/2017   Procedure: RIGHT TRANSMETATARSAL AMPUTATION;  Surgeon: Leandrew Koyanagi, MD;  Location: [facility];  Service: Orthopedics;  Laterality:  Right;  . SMALL INTESTINE SURGERY    . STOMACH SURGERY    . TOE AMPUTATION Right 08/2016   right great toe   Allergies  Allergen Reactions  . Aspirin Swelling  . Ibuprofen Swelling  . Sustiva [Efavirenz] Swelling and Rash  . Nsaids Other (See Comments)    unknwn   Prior to Admission medications   Medication Sig Start Date End Date Taking? Authorizing Provider  abacavir-dolutegravir-lamiVUDine (TRIUMEQ) 600-50-300 MG tablet Take 1 tablet by mouth daily. Patient taking differently: Take 1 tablet by mouth at bedtime.  06/09/17  Yes Comer, Okey Regal, MD  acetaminophen (TYLENOL) 500 MG tablet Take 1,000-1,500 mg by mouth every 8 (eight) hours as needed for moderate pain.    Yes [provider]  ALPRAZolam Duanne Moron) 1 MG tablet Take 1 mg by mouth 4 (four) times daily as needed for anxiety.    Yes [provider]  amphetamine-dextroamphetamine (ADDERALL) 30 MG tablet Take 30 mg by mouth 3 (three) times daily.   Yes [provider]  Brexpiprazole (REXULTI) 1 MG TABS Take 1 mg by mouth at bedtime.   Yes [provider]  diclofenac sodium (VOLTAREN) 1 % GEL APPLY 2 GRAMS TO EACH KNEE IN THE MORNING AND AT BEDTIME AND APPLY 1 GRAM TO EACH KNEE IN THE AFTERNOON. 05/16/17  Yes [name], MD  divalproex (DEPAKOTE ER) 500 MG 24 hr tablet TAKE 1 TABLET BY MOUTH EVERY NIGHT AT BEDTIME 09/15/17  Yes Marcial Pacas, MD  glucose blood (FREESTYLE PRECISION NEO TEST) test strip Used to check blood sugars twice daily. 07/28/17  Yes Renato Shin, MD  insulin aspart (NOVOLOG FLEXPEN) 100 UNIT/ML FlexPen 10 units with breakfast, and 20 units with evening meal Patient taking differently: Inject 10-20 Units into the skin See admin instructions. 10 units with breakfast, and 20 units with evening meal 09/21/17  Yes Renato Shin, MD  Insulin Glargine (LANTUS SOLOSTAR) 100 UNIT/ML Solostar Pen Inject 150 Units into the skin every morning. 09/21/17  Yes Renato Shin, MD  Insulin Pen  Needle (B-D ULTRAFINE III SHORT PEN) 31G X 8 MM MISC USE AS DIRECTED TO INJECT LANTUS AND NOVOLOG DAILY 11/09/17  Yes Renato Shin, MD  levothyroxine (SYNTHROID, LEVOTHROID) 50 MCG tablet Take 1 tablet (50 mcg total) by mouth at bedtime. 07/24/15  Yes [name], MD  LYRICA 100 MG capsule TAKE 1 CAPSULE BY MOUTH THREE TIMES DAILY 10/21/17  Yes [name], MD  ondansetron (ZOFRAN) 8 MG tablet Take 8 mg by mouth every 8 (eight) hours as needed for nausea or vomiting.   Yes [provider]  pantoprazole (PROTONIX) 40 MG tablet Take 1 tablet (40 mg total) by mouth daily at 6 (six) AM. 10/06/17  Yes Eugenie Filler, MD  polyethylene glycol (MIRALAX / GLYCOLAX) packet Take 17 g by mouth 2 (two) times daily. 10/05/17  Yes Eugenie Filler, MD  protriptyline (VIVACTIL) 10 MG tablet Take 10 mg by  mouth 3 (three) times daily.  01/30/16  Yes [provider]  ranitidine (ZANTAC) 150 MG tablet Take 150 mg by mouth daily as needed.    Yes [provider]  rivaroxaban (XARELTO) 20 MG TABS tablet Take 1 tablet (20 mg total) by mouth daily. 10/21/17  Yes [name], MD  TRINTELLIX 20 MG TABS Take 20 mg by mouth at bedtime. 01/26/15  Yes [provider]  UNABLE TO FIND CPAP MACHINE with standard Aclaim nasal mask with humidifier. Set at 14 cwp Patient taking differently: CPAP MACHINE with standard Aclaim nasal mask with humidifier. Set at 4 cwp 04/12/13  Yes Daub, Loura Back, MD  zolpidem (AMBIEN) 10 MG tablet Take 10 mg by mouth at bedtime.    Yes [provider]  HYDROcodone-acetaminophen (NORCO/VICODIN) 5-325 MG tablet Take 1-2 tablets by mouth every 4 (four) hours as needed for moderate pain. Patient not taking: Reported on 11/16/2017 10/05/17   Eugenie Filler, MD   Social History   Socioeconomic History  . Marital status: Single    Spouse name: Not on file  . Number of children: Not on file  . Years of education: Not on file  . Highest education  level: Not on file  Occupational History    Comment: DISABILITY  Social Needs  . Financial resource strain: Not hard at all  . Food insecurity:    Worry: Never true    Inability: Never true  . Transportation needs:    Medical: No    Non-medical: No  Tobacco Use  . Smoking status: Former Smoker    Packs/day: 0.10    Years: 10.00    Pack years: 1.00    Types: Cigars    Last attempt to quit: 08/09/2014    Years since quitting: 3.2  . Smokeless tobacco: Never Used  Substance and Sexual Activity  . Alcohol use: No    Alcohol/week: 0.0 standard drinks  . Drug use: No  . Sexual activity: Not Currently    Partners: Male    Comment: pt. declined condoms  Lifestyle  . Physical activity:    Days per week: 0 days    Minutes per session: 0 min  . Stress: Only a little  Relationships  . Social connections:    Talks on phone: More than three times a week    Gets together: Once a week    Attends religious service: Never    Active member of club or organization: No    Attends meetings of clubs or organizations: Not on file    Relationship status: Never married  . Intimate partner violence:    Fear of current or ex partner: No    Emotionally abused: No    Physically abused: No    Forced sexual activity: No  Other Topics Concern  . Not on file  Social History Narrative   Epworth Sleepiness Scale = 7 (as of 03/16/2015)      Review of Systems  Constitutional: Negative for chills, fatigue, fever and unexpected weight change.  HENT: Negative for congestion, rhinorrhea, sinus pressure and sore throat.   Respiratory: Negative for cough, chest tightness and shortness of breath.   Cardiovascular: Negative for chest pain, palpitations and leg swelling.  Gastrointestinal: Negative for abdominal distention, abdominal pain, constipation and diarrhea.  Neurological: Negative for dizziness, light-headedness and headaches.  Hematological: Negative for adenopathy.  Psychiatric/Behavioral:  Negative for dysphoric mood and sleep disturbance. The patient is not nervous/anxious.   All other systems reviewed and  are negative.      Objective:   Physical Exam  Constitutional: He is oriented to person, place, and time. He appears well-developed and well-nourished.  HENT:  Head: Normocephalic and atraumatic.  Eyes: Pupils are equal, round, and reactive to light. EOM are normal.  Neck: No JVD present. Carotid bruit is not present.  Cardiovascular: Normal rate, regular rhythm and normal heart sounds.  No murmur heard. Pulmonary/Chest: Effort normal and breath sounds normal. He has no rales.  Musculoskeletal: He exhibits no edema.       Right foot: There is swelling.       Left foot: There is swelling.  1+ pedal edema bilaterally  Neurological: He is alert and oriented to person, place, and time.  Skin: Skin is warm and dry.  Psychiatric: He has a normal mood and affect.  Vitals reviewed.   Vitals:   11/16/17 1559  BP: 114/70  Pulse: 99  Temp: 97.9 F (36.6 C)  SpO2: 96%       Assessment & Plan:   Daniel [name] is a 62 y.o. male History of DVT (deep vein thrombosis)  -Tolerating current dose of Xarelto.  Continue same.  Peripheral neuropathic pain  -Persistent symptoms with worsening neuropathic pain.  Has been tolerating 400 mg/day but with 200 mg twice daily dosing on his own.    -For improved symptom control during the day and increased benefit will try 150 mg 3 times daily.  Cautioned on potential side effects at that higher dose, but still recommend follow-up with neurology as planned.  Meds ordered this encounter  Medications  . pregabalin (LYRICA) 150 MG capsule    Sig: Take 1 capsule (150 mg total) by mouth 3 (three) times daily.    Dispense:  90 capsule    Refill:  2   Patient Instructions   For neuropathic pain, we can try higher dose of Lyrica for now, but as we discussed there is an increased risk of side effects over 366m per day. We can try  1566m3 times a day (as you have been tolerating the 400 mg total per day)  I expect the 3 times per day dosing may actually be more beneficial to keep symptoms from returning during the middle the day.  Please watch for any lightheadedness, dizziness or worsening side effects with Lyrica.  If those occur let me know and we can look into other options.  Follow-up with Dr. YaKrista Blues planned, but we can adjust doses prior to that visit if needed.  Thank you for coming in today.  If you have lab work done today you will be contacted with your lab results within the next 2 weeks.  If you have not heard from usKoreahen please contact usKoreaThe fastest way to get your results is to register for My Chart.   IF you received an x-ray today, you will receive an invoice from [facility]. Please contact [facility] at [phone] questions or concerns regarding your invoice.   IF you received labwork today, you will receive an invoice from [facility] Point[facility] contact [facility] at [phone] questions or concerns regarding your invoice.   Our billing staff will not be able to assist you with questions regarding bills from these companies.  You will be contacted with the lab results as soon as they are available. The fastest way to get your results is to activate your My Chart account. Instructions are located on the last page of this paperwork. If  you have not heard from [name] regarding the results in 2 weeks, please contact this office.       I personally performed the services described in this documentation, which was scribed in my presence. The recorded information has been reviewed and considered for accuracy and completeness, addended by me as needed, and agree with information above.  Signed,   [name], MD Primary Care at [place].  11/21/17 7:31 AM

## 2017-11-16 NOTE — Assessment & Plan Note (Signed)
Discussed the flu shot and this was given today.

## 2017-11-16 NOTE — Patient Outreach (Signed)
RN CM unable to reach pt,  Pt did not respond to letter mailed to his home,  Case closed.  RN CM mailed case closure letter to patient's home.  Case closed  Jacqlyn Larsen [facility], [place] Coordinator [phone]

## 2017-11-16 NOTE — Assessment & Plan Note (Signed)
He is doing well on his medication no changes today.  He will return in 6 months for routine follow-up.

## 2017-11-16 NOTE — Progress Notes (Signed)
   Subjective:    Patient ID: Daniel [name], male    DOB: 1955/10/19, 62 y.o.   MRN: [id number]  HPI He is here for follow-up of HIV as well as hospital follow-up for his osteomyelitis with amputation. He was seen in August and second right toe osteomyelitis with significant drainage and underwent procedure as above of transmetatarsal amputation.  He was seen by my partner, [name], and given 2 weeks of doxycycline and Augmentin.  He has otherwise done well with his HIV including no missed doses.  His recent labs show good control with a CD4 of 900 and a viral load that is less than 20.  He otherwise has no complaints today.  He has continued to take doxycycline per [name] though his foot is draining no pus, no significant erythema and otherwise is healing well.  No associated fever chills or nausea or diarrhea.   Review of Systems  Constitutional: Negative for chills, fatigue and fever.  Gastrointestinal: Negative for diarrhea.  Skin: Negative for rash.  Neurological: Negative for dizziness.       Objective:   Physical Exam  Constitutional: He appears well-developed and well-nourished. No distress.  Eyes: No scleral icterus.  Cardiovascular: Normal rate, regular rhythm and normal heart sounds.  No murmur heard. Pulmonary/Chest: Effort normal and breath sounds normal. No respiratory distress.  Skin: No rash noted.   SH: former smoker       Assessment & Plan:

## 2017-11-17 ENCOUNTER — Ambulatory Visit: Payer: Self-pay

## 2017-11-18 ENCOUNTER — Ambulatory Visit: Payer: Self-pay

## 2017-11-19 DIAGNOSIS — B20 Human immunodeficiency virus [HIV] disease: Secondary | ICD-10-CM | POA: Diagnosis not present

## 2017-11-19 DIAGNOSIS — L03115 Cellulitis of right lower limb: Secondary | ICD-10-CM | POA: Diagnosis not present

## 2017-11-19 DIAGNOSIS — N183 Chronic kidney disease, stage 3 (moderate): Secondary | ICD-10-CM | POA: Diagnosis not present

## 2017-11-19 DIAGNOSIS — E11628 Type 2 diabetes mellitus with other skin complications: Secondary | ICD-10-CM | POA: Diagnosis not present

## 2017-11-19 DIAGNOSIS — Z86718 Personal history of other venous thrombosis and embolism: Secondary | ICD-10-CM | POA: Diagnosis not present

## 2017-11-19 DIAGNOSIS — E1122 Type 2 diabetes mellitus with diabetic chronic kidney disease: Secondary | ICD-10-CM | POA: Diagnosis not present

## 2017-11-19 DIAGNOSIS — F330 Major depressive disorder, recurrent, mild: Secondary | ICD-10-CM | POA: Diagnosis not present

## 2017-11-19 DIAGNOSIS — Z4781 Encounter for orthopedic aftercare following surgical amputation: Secondary | ICD-10-CM | POA: Diagnosis not present

## 2017-11-19 DIAGNOSIS — Z89421 Acquired absence of other right toe(s): Secondary | ICD-10-CM | POA: Diagnosis not present

## 2017-11-19 DIAGNOSIS — E039 Hypothyroidism, unspecified: Secondary | ICD-10-CM | POA: Diagnosis not present

## 2017-11-19 DIAGNOSIS — K5900 Constipation, unspecified: Secondary | ICD-10-CM | POA: Diagnosis not present

## 2017-11-19 DIAGNOSIS — Z7901 Long term (current) use of anticoagulants: Secondary | ICD-10-CM | POA: Diagnosis not present

## 2017-11-19 DIAGNOSIS — G4733 Obstructive sleep apnea (adult) (pediatric): Secondary | ICD-10-CM | POA: Diagnosis not present

## 2017-11-19 DIAGNOSIS — Z794 Long term (current) use of insulin: Secondary | ICD-10-CM | POA: Diagnosis not present

## 2017-11-19 DIAGNOSIS — M86171 Other acute osteomyelitis, right ankle and foot: Secondary | ICD-10-CM | POA: Diagnosis not present

## 2017-11-21 NOTE — Patient Outreach (Signed)
[facility]) Care Management  11/04/2017  [place] 06-Feb-1956 [id number]    Outreach call placed in the absence of Daniel [name] assigned RNCM. He reported compliance with medications and MD appointments. He denied falls and confirmed engagement with Home Health services. Member declined need for transportation, medication assistance or community resources. Reported that private duty care aide was available to assist in the home as needed. Daniel [name] denied questions or urgent concerns. Agreeable to follow up outreach on next week.   [place] [phone]

## 2017-11-23 ENCOUNTER — Ambulatory Visit: Payer: PPO

## 2017-11-24 ENCOUNTER — Other Ambulatory Visit: Payer: Self-pay | Admitting: Family Medicine

## 2017-11-24 DIAGNOSIS — M792 Neuralgia and neuritis, unspecified: Secondary | ICD-10-CM

## 2017-11-25 ENCOUNTER — Ambulatory Visit (INDEPENDENT_AMBULATORY_CARE_PROVIDER_SITE_OTHER): Payer: PPO | Admitting: Orthopaedic Surgery

## 2017-11-25 NOTE — Telephone Encounter (Signed)
Requested Prescriptions  Pending Prescriptions Disp Refills   LYRICA 100 MG capsule [Pharmacy Med Name: LYRICA 100MG CAPSULES] 90 capsule 0    Sig: TAKE 1 CAPSULE BY MOUTH THREE TIMES DAILY     Not Delegated - Neurology:  Anticonvulsants - Controlled Failed - 11/24/2017  4:34 PM      Failed - This refill cannot be delegated      Passed - Valid encounter within last 12 months    Recent Outpatient Visits          1 week ago History of DVT (deep vein thrombosis)   [facility] [place], Ranell Patrick, MD   2 months ago Weakness on right side of face   [facility], Ranell Patrick, MD   4 months ago Folliculitis   [facility], Ranell Patrick, MD   7 months ago Pain in both knees, unspecified chronicity   [facility], Ranell Patrick, MD   7 months ago Sore throat   [facility], Ranell Patrick, MD      Future Appointments            In 6 days Leandrew Koyanagi, MD [facility]   In 2 months Carlota Raspberry, Ranell Patrick, MD Primary Care at [place], [facility]

## 2017-11-26 ENCOUNTER — Ambulatory Visit: Payer: PPO

## 2017-11-27 ENCOUNTER — Encounter: Payer: Self-pay | Admitting: Internal Medicine

## 2017-12-01 ENCOUNTER — Ambulatory Visit (INDEPENDENT_AMBULATORY_CARE_PROVIDER_SITE_OTHER): Payer: PPO | Admitting: Physician Assistant

## 2017-12-01 ENCOUNTER — Encounter (INDEPENDENT_AMBULATORY_CARE_PROVIDER_SITE_OTHER): Payer: Self-pay | Admitting: Orthopaedic Surgery

## 2017-12-01 DIAGNOSIS — I10 Essential (primary) hypertension: Secondary | ICD-10-CM | POA: Diagnosis not present

## 2017-12-01 DIAGNOSIS — R55 Syncope and collapse: Secondary | ICD-10-CM | POA: Diagnosis not present

## 2017-12-01 DIAGNOSIS — E876 Hypokalemia: Secondary | ICD-10-CM | POA: Diagnosis not present

## 2017-12-01 DIAGNOSIS — B20 Human immunodeficiency virus [HIV] disease: Secondary | ICD-10-CM | POA: Diagnosis not present

## 2017-12-01 DIAGNOSIS — Z89431 Acquired absence of right foot: Secondary | ICD-10-CM

## 2017-12-01 DIAGNOSIS — E0849 Diabetes mellitus due to underlying condition with other diabetic neurological complication: Secondary | ICD-10-CM | POA: Diagnosis not present

## 2017-12-01 DIAGNOSIS — Z6836 Body mass index (BMI) 36.0-36.9, adult: Secondary | ICD-10-CM | POA: Diagnosis not present

## 2017-12-01 DIAGNOSIS — N179 Acute kidney failure, unspecified: Secondary | ICD-10-CM | POA: Diagnosis not present

## 2017-12-01 DIAGNOSIS — Z794 Long term (current) use of insulin: Secondary | ICD-10-CM | POA: Diagnosis not present

## 2017-12-01 MED ORDER — DOXYCYCLINE HYCLATE 50 MG PO CAPS: 50.0000 mg | ORAL_CAPSULE | Freq: Two times a day (BID) | ORAL | 0 refills | Status: DC

## 2017-12-01 NOTE — Progress Notes (Signed)
Post-Op Visit Note   Patient: Daniel [name]           Date of Birth: 02-06-1956           MRN: [id number] Visit Date: 12/01/2017 PCP: [name], MD   Assessment & Plan:  Chief Complaint:  Chief Complaint  Patient presents with  . Right Foot - Follow-up   Visit Diagnoses:  1. S/P transmetatarsal amputation of foot, right [facility])     Plan: Patient is a 62 year old gentleman who presents to our clinic today approximately 8 weeks status post right foot transmetatarsal amputation, date of surgery 10/02/2017.  He notes that advanced home care stopped coming to his house about 2 weeks ago as they sent him a letter saying they were unable to get in touch with him.  He has however been applying wet-to-dry dressings twice a day himself.  No increased pain.  No fevers or chills.  He has been elevating his leg at night but that is it.  Examination of the right foot wound reveals a well healing surgical incision with beefy red granulation tissue.  Normal serous drainage.  At this point, we will have the patient continue wet-to-dry dressings twice daily.  We will continue his doxycycline.  He will elevate at almost all times.  He will follow-up with [name] in 3 weeks time for wound check.  We will continue with wet-to-dry dressings and doxycycline until his wound has completely closed.  Follow-Up Instructions: Return in about 3 weeks (around 12/22/2017).   Orders:  No orders of the defined types were placed in this encounter.  Meds ordered this encounter  Medications  . doxycycline (VIBRAMYCIN) 50 MG capsule    Sig: Take 1 capsule (50 mg total) by mouth 2 (two) times daily.    Dispense:  42 capsule    Refill:  0    Imaging: No new imaging  PMFS History: Patient Active Problem List   Diagnosis Date Noted  . Vaccine counseling 11/16/2017  . S/P transmetatarsal amputation of foot, right ([place]) 10/09/2017  . Deep vein thrombosis (DVT) ([place])   . Osteomyelitis ([place])   . Hypothyroidism   .  Constipation   . History of DVT (deep vein thrombosis) 09/30/2017  . Morbid obesity due to excess calories ([place]) complicated by DM / hyperlipidemia 01/15/2017  . DOE (dyspnea on exertion) 01/13/2017  . Chronic migraine 09/17/2016  . Gait abnormality 09/17/2016  . Diabetic foot infection ([place]) 06/28/2016  . Fall 12/05/2015  . Toe ulcer, right ([place]) 09/19/2015  . Decreased pedal pulses 09/19/2015  . OSA on CPAP 09/05/2015  . Major depressive disorder, recurrent episode, moderate ([place]) 09/05/2015  . Low back pain 06/11/2015  . Abnormality of gait 06/11/2015  . Dizziness 03/17/2015  . Weakness 02/21/2015  . Chronic renal insufficiency, stage III (moderate) ([place]) 08/09/2014  . Insulin-requiring or dependent type II diabetes mellitus ([place]) 11/07/2013  . Hematuria 06/21/2013  . Hepatic steatosis 09/09/2010  . Human immunodeficiency virus (HIV) disease ([place]) 06/04/2006  . HERPES ZOSTER, UNCOMPLICATED 83/66/2947  . THROMBOPHLEBITIS NOS 06/04/2006  . GERD 06/04/2006  . ARTHRITIS, HAND 06/04/2006   Past Medical History:  Diagnosis Date  . ADHD (attention deficit hyperactivity disorder)   . Anxiety   . Chronic kidney disease   . Clotting disorder ([place])   . Depression   . Diabetes mellitus without complication ([place])   . Diabetes mellitus, type II ([place])   . Dizziness 03/17/2015  . GERD (gastroesophageal reflux disease)   .  HIV disease ([place])   . HIV infection ([place])   . Liver disease   . OSA (obstructive sleep apnea) 07/25/2015   Uses CPAP regularly  . Peripheral vascular disease ([place])   . Ulcer     Family History  Problem Relation Age of Onset  . Depression Brother   . Throat cancer Brother        half brother, never smoker  . COPD Mother   . Diabetes Neg Hx     Past Surgical History:  Procedure Laterality Date  . AMPUTATION Right 10/02/2017   Procedure: RIGHT TRANSMETATARSAL AMPUTATION;  Surgeon: Leandrew Koyanagi, MD;  Location: [facility];  Service: Orthopedics;  Laterality: Right;  .  SMALL INTESTINE SURGERY    . STOMACH SURGERY    . TOE AMPUTATION Right 08/2016   right great toe   Social History   Occupational History    Comment: DISABILITY  Tobacco Use  . Smoking status: Former Smoker    Packs/day: 0.10    Years: 10.00    Pack years: 1.00    Types: Cigars    Last attempt to quit: 08/09/2014    Years since quitting: 3.3  . Smokeless tobacco: Never Used  Substance and Sexual Activity  . Alcohol use: No    Alcohol/week: 0.0 standard drinks  . Drug use: No  . Sexual activity: Not Currently    Partners: Male    Comment: pt. declined condoms

## 2017-12-03 DIAGNOSIS — H905 Unspecified sensorineural hearing loss: Secondary | ICD-10-CM | POA: Diagnosis not present

## 2017-12-15 ENCOUNTER — Other Ambulatory Visit: Payer: Self-pay

## 2017-12-15 ENCOUNTER — Other Ambulatory Visit: Payer: Self-pay | Admitting: Neurology

## 2017-12-15 ENCOUNTER — Other Ambulatory Visit: Payer: Self-pay | Admitting: Family Medicine

## 2017-12-15 ENCOUNTER — Telehealth: Payer: Self-pay | Admitting: Family Medicine

## 2017-12-15 MED ORDER — PANTOPRAZOLE SODIUM 40 MG PO TBEC: 40.0000 mg | DELAYED_RELEASE_TABLET | Freq: Every day | ORAL | 0 refills | Status: DC

## 2017-12-15 NOTE — Telephone Encounter (Signed)
Rx sent to pharmacy   

## 2017-12-15 NOTE — Telephone Encounter (Signed)
Copied from [place] [id number]. Topic: General - Other >> Dec 15, 2017 10:08 AM Carolyn Stare wrote:  Pt call to say the hosp gave him PANTORAZOLE when he was in the hospital because the med that he takes upset his stomach. He is asking if the med can be given to him he saw Dr Nyoka Cowden in a Sept   Pharmacy [facility] [place]

## 2017-12-15 NOTE — Telephone Encounter (Signed)
Requested medication (s) are due for refill today: yes  Requested medication (s) are on the active medication list: yes  Last refill:  10/05/17 by a different provider  Future visit scheduled: yes   02/08/18  Notes to clinic:  Medication filled by another provider previously    Requested Prescriptions  Pending Prescriptions Disp Refills   pantoprazole (PROTONIX) 40 MG tablet [Pharmacy Med Name: PANTOPRAZOLE 40MG TABLETS] 30 tablet 0    Sig: TAKE 1 TABLET BY MOUTH EVERY DAY AT 6:00 AM     Gastroenterology: Proton Pump Inhibitors Passed - 12/15/2017 10:01 AM      Passed - Valid encounter within last 12 months    Recent Outpatient Visits          4 weeks ago History of DVT (deep vein thrombosis)   [facility], Ranell Patrick, MD   3 months ago Weakness on right side of face   [facility] at [place], MD   5 months ago Folliculitis   [facility], Ranell Patrick, MD   7 months ago Pain in both knees, unspecified chronicity   [facility], Ranell Patrick, MD   8 months ago Sore throat   [facility], Ranell Patrick, MD      Future Appointments            In 2 weeks Leandrew Koyanagi, MD [facility]   In 1 month Carlota Raspberry, Ranell Patrick, MD Primary Care at [place], [facility]

## 2017-12-16 ENCOUNTER — Telehealth (INDEPENDENT_AMBULATORY_CARE_PROVIDER_SITE_OTHER): Payer: Self-pay

## 2017-12-16 ENCOUNTER — Ambulatory Visit (INDEPENDENT_AMBULATORY_CARE_PROVIDER_SITE_OTHER): Payer: PPO | Admitting: Orthopaedic Surgery

## 2017-12-16 ENCOUNTER — Encounter (INDEPENDENT_AMBULATORY_CARE_PROVIDER_SITE_OTHER): Payer: Self-pay | Admitting: Orthopaedic Surgery

## 2017-12-16 DIAGNOSIS — Z89431 Acquired absence of right foot: Secondary | ICD-10-CM

## 2017-12-16 NOTE — Telephone Encounter (Signed)
Please submit for gel inj SYNVISC- Dr Mikki Santee KNEE

## 2017-12-16 NOTE — Progress Notes (Signed)
Post-Op Visit Note   Patient: Daniel [name]           Date of Birth: 06/10/1955           MRN: [id number] Visit Date: 12/16/2017 PCP: [name], MD   Assessment & Plan:  Chief Complaint:  Chief Complaint  Patient presents with  . Right Foot - Pain   Visit Diagnoses:  1. S/P transmetatarsal amputation of foot, right ([place])     Plan: Roland is status post left transmetatarsal amputation.  He is overall doing well.  He has been performing wound wet-to-dry dressings.  He comes in for wound check.  Denies any constitutional symptoms.  Overall his wounds are improving.  Continue with wet-to-dry dressings.  Follow-up in 4 weeks for recheck of the wounds.  He did tell me today incidentally that he had inadvertently stopped taking his Xarelto so I offered to order Doppler to check for DVTs but he declined.  Follow-Up Instructions: Return if symptoms worsen or fail to improve.   Orders:  No orders of the defined types were placed in this encounter.  No orders of the defined types were placed in this encounter.   Imaging: No results found.  PMFS History: Patient Active Problem List   Diagnosis Date Noted  . Vaccine counseling 11/16/2017  . S/P transmetatarsal amputation of foot, right ([place]) 10/09/2017  . Deep vein thrombosis (DVT) ([place])   . Osteomyelitis ([place])   . Hypothyroidism   . Constipation   . History of DVT (deep vein thrombosis) 09/30/2017  . Morbid obesity due to excess calories ([place]) complicated by DM / hyperlipidemia 01/15/2017  . DOE (dyspnea on exertion) 01/13/2017  . Chronic migraine 09/17/2016  . Gait abnormality 09/17/2016  . Diabetic foot infection ([place]) 06/28/2016  . Fall 12/05/2015  . Toe ulcer, right ([place]) 09/19/2015  . Decreased pedal pulses 09/19/2015  . OSA on CPAP 09/05/2015  . Major depressive disorder, recurrent episode, moderate ([place]) 09/05/2015  . Low back pain 06/11/2015  . Abnormality of gait 06/11/2015  . Dizziness 03/17/2015  .  Weakness 02/21/2015  . Chronic renal insufficiency, stage III (moderate) ([place]) 08/09/2014  . Insulin-requiring or dependent type II diabetes mellitus ([place]) 11/07/2013  . Hematuria 06/21/2013  . Hepatic steatosis 09/09/2010  . Human immunodeficiency virus (HIV) disease ([place]) 06/04/2006  . HERPES ZOSTER, UNCOMPLICATED 74/09/1446  . THROMBOPHLEBITIS NOS 06/04/2006  . GERD 06/04/2006  . ARTHRITIS, HAND 06/04/2006   Past Medical History:  Diagnosis Date  . ADHD (attention deficit hyperactivity disorder)   . Anxiety   . Chronic kidney disease   . Clotting disorder ([place])   . Depression   . Diabetes mellitus without complication ([place])   . Diabetes mellitus, type II ([place])   . Dizziness 03/17/2015  . GERD (gastroesophageal reflux disease)   . HIV disease ([place])   . HIV infection ([place])   . Liver disease   . OSA (obstructive sleep apnea) 07/25/2015   Uses CPAP regularly  . Peripheral vascular disease ([place])   . Ulcer     Family History  Problem Relation Age of Onset  . Depression Brother   . Throat cancer Brother        half brother, never smoker  . COPD Mother   . Diabetes Neg Hx     Past Surgical History:  Procedure Laterality Date  . AMPUTATION Right 10/02/2017   Procedure: RIGHT TRANSMETATARSAL AMPUTATION;  Surgeon: Leandrew Koyanagi, MD;  Location: [facility];  Service: Orthopedics;  Laterality:  Right;  [name] SMALL INTESTINE SURGERY    . STOMACH SURGERY    . TOE AMPUTATION Right 08/2016   right great toe   Social History   Occupational History    Comment: DISABILITY  Tobacco Use  . Smoking status: Former Smoker    Packs/day: 0.10    Years: 10.00    Pack years: 1.00    Types: Cigars    Last attempt to quit: 08/09/2014    Years since quitting: 3.3  . Smokeless tobacco: Never Used  Substance and Sexual Activity  . Alcohol use: No    Alcohol/week: 0.0 standard drinks  . Drug use: No  . Sexual activity: Not Currently    Partners: Male    Comment: pt. declined condoms

## 2017-12-17 DIAGNOSIS — H905 Unspecified sensorineural hearing loss: Secondary | ICD-10-CM | POA: Diagnosis not present

## 2017-12-17 NOTE — Telephone Encounter (Signed)
Noted  

## 2017-12-21 ENCOUNTER — Ambulatory Visit: Payer: PPO | Admitting: Endocrinology

## 2017-12-28 ENCOUNTER — Telehealth (INDEPENDENT_AMBULATORY_CARE_PROVIDER_SITE_OTHER): Payer: Self-pay

## 2017-12-28 NOTE — Telephone Encounter (Signed)
Submitted VOB for SynviscOne, bilateral knee.

## 2017-12-29 ENCOUNTER — Ambulatory Visit (INDEPENDENT_AMBULATORY_CARE_PROVIDER_SITE_OTHER): Payer: PPO | Admitting: Endocrinology

## 2017-12-29 ENCOUNTER — Ambulatory Visit (INDEPENDENT_AMBULATORY_CARE_PROVIDER_SITE_OTHER): Payer: PPO | Admitting: Orthopaedic Surgery

## 2017-12-29 ENCOUNTER — Encounter: Payer: Self-pay | Admitting: Endocrinology

## 2017-12-29 VITALS — BP 130/70 | HR 106 | Ht 71.0 in | Wt 250.4 lb

## 2017-12-29 DIAGNOSIS — Z794 Long term (current) use of insulin: Secondary | ICD-10-CM

## 2017-12-29 DIAGNOSIS — E0849 Diabetes mellitus due to underlying condition with other diabetic neurological complication: Secondary | ICD-10-CM | POA: Diagnosis not present

## 2017-12-29 LAB — POCT GLYCOSYLATED HEMOGLOBIN (HGB A1C): Hemoglobin A1C: 7.8 % — AB (ref 4.0–5.6)

## 2017-12-29 NOTE — Patient Instructions (Addendum)
check your blood sugar twice a day.  vary the time of day when you check, between before the 3 meals, and at bedtime.  also check if you have symptoms of your blood sugar being too high or too low.  please keep a record of the readings and bring it to your next appointment here (or you can bring the meter itself).  You can write it on any piece of paper.  please call us sooner if your blood sugar goes below 70, or if you have a lot of readings over 200.   Please continue the same insulins.   Please come back for a follow-up appointment in 3 months.

## 2017-12-29 NOTE — Progress Notes (Signed)
Subjective:    Patient ID: Daniel [name], male    DOB: [birth year], 62 y.o.   MRN: [id number]  HPI Pt returns for f/u of diabetes mellitus: DM type: Insulin-requiring type 2.  Dx'ed: 0932 Complications: polyneuropathy, PAD, renal insufficiency, and foot ulcers.  Therapy: insulin since 2016. DKA: never Severe hypoglycemia: never.  Pancreatitis: never.   Other: he takes multiple daily injections, but basal insulin is emphasized, with improved results.  Interval history: I reviewed continuous glucose monitor data.  Glucose varies from 150-300.  It is in general higher as the day goes on.  Pt says she takes insulin as rx'ed.   Past Medical History:  Diagnosis Date  . ADHD (attention deficit hyperactivity disorder)   . Anxiety   . Chronic kidney disease   . Clotting disorder ([place])   . Depression   . Diabetes mellitus without complication ([place])   . Diabetes mellitus, type II ([place])   . Dizziness 03/17/2015  . GERD (gastroesophageal reflux disease)   . HIV disease ([place])   . HIV infection ([place])   . Liver disease   . OSA (obstructive sleep apnea) 07/25/2015   Uses CPAP regularly  . Peripheral vascular disease ([place])   . Ulcer     Past Surgical History:  Procedure Laterality Date  . AMPUTATION Right 10/02/2017   Procedure: RIGHT TRANSMETATARSAL AMPUTATION;  Surgeon: Leandrew Koyanagi, MD;  Location: [facility];  Service: Orthopedics;  Laterality: Right;  . SMALL INTESTINE SURGERY    . STOMACH SURGERY    . TOE AMPUTATION Right 08/2016   right great toe    Social History   Socioeconomic History  . Marital status: Single    Spouse name: Not on file  . Number of children: Not on file  . Years of education: Not on file  . Highest education level: Not on file  Occupational History    Comment: DISABILITY  Social Needs  . Financial resource strain: Not hard at all  . Food insecurity:    Worry: Never true    Inability: Never true  . Transportation needs:    Medical: No    Non-medical:  No  Tobacco Use  . Smoking status: Former Smoker    Packs/day: 0.10    Years: 10.00    Pack years: 1.00    Types: Cigars    Last attempt to quit: 08/09/2014    Years since quitting: 3.4  . Smokeless tobacco: Never Used  Substance and Sexual Activity  . Alcohol use: No    Alcohol/week: 0.0 standard drinks  . Drug use: No  . Sexual activity: Not Currently    Partners: Male    Comment: pt. declined condoms  Lifestyle  . Physical activity:    Days per week: 0 days    Minutes per session: 0 min  . Stress: Only a little  Relationships  . Social connections:    Talks on phone: More than three times a week    Gets together: Once a week    Attends religious service: Never    Active member of club or organization: No    Attends meetings of clubs or organizations: Not on file    Relationship status: Never married  . Intimate partner violence:    Fear of current or ex partner: No    Emotionally abused: No    Physically abused: No    Forced sexual activity: No  Other Topics Concern  . Not on file  Social History Narrative  Epworth Sleepiness Scale = 7 (as of 03/16/2015)    Current Outpatient Medications on File Prior to Visit  Medication Sig Dispense Refill  . abacavir-dolutegravir-lamiVUDine (TRIUMEQ) 600-50-300 MG tablet Take 1 tablet by mouth daily. (Patient taking differently: Take 1 tablet by mouth at bedtime. ) 30 tablet 6  . acetaminophen (TYLENOL) 500 MG tablet Take 1,000-1,500 mg by mouth every 8 (eight) hours as needed for moderate pain.     [name] ALPRAZolam (XANAX) 1 MG tablet Take 1 mg by mouth 4 (four) times daily as needed for anxiety.     [name] amphetamine-dextroamphetamine (ADDERALL) 30 MG tablet Take 30 mg by mouth 3 (three) times daily.    . Brexpiprazole (REXULTI) 1 MG TABS Take 1 mg by mouth at bedtime.    . diclofenac sodium (VOLTAREN) 1 % GEL APPLY 2 GRAMS TO EACH KNEE IN THE MORNING AND AT BEDTIME AND APPLY 1 GRAM TO EACH KNEE IN THE AFTERNOON. 300 g 0  . divalproex  (DEPAKOTE ER) 500 MG 24 hr tablet TAKE 1 TABLET BY MOUTH EVERY NIGHT AT BEDTIME 90 tablet 0  . glucose blood (FREESTYLE PRECISION NEO TEST) test strip Used to check blood sugars twice daily. 100 each 12  . insulin aspart (NOVOLOG FLEXPEN) 100 UNIT/ML FlexPen 10 units with breakfast, and 20 units with evening meal (Patient taking differently: Inject 10-20 Units into the skin See admin instructions. 10 units with breakfast, and 20 units with evening meal) 30 mL 11  . Insulin Glargine (LANTUS SOLOSTAR) 100 UNIT/ML Solostar Pen Inject 150 Units into the skin every morning. 20 pen PRN  . Insulin Pen Needle (B-D ULTRAFINE III SHORT PEN) 31G X 8 MM MISC USE AS DIRECTED TO INJECT LANTUS AND NOVOLOG DAILY 100 each 0  . levothyroxine (SYNTHROID, LEVOTHROID) 50 MCG tablet Take 1 tablet (50 mcg total) by mouth at bedtime. 30 tablet 11  . ondansetron (ZOFRAN) 8 MG tablet Take 8 mg by mouth every 8 (eight) hours as needed for nausea or vomiting.    . polyethylene glycol (MIRALAX / GLYCOLAX) packet Take 17 g by mouth 2 (two) times daily. 30 each 0  . pregabalin (LYRICA) 150 MG capsule Take 1 capsule (150 mg total) by mouth 3 (three) times daily. 90 capsule 2  . protriptyline (VIVACTIL) 10 MG tablet Take 10 mg by mouth 3 (three) times daily.   11  . rivaroxaban (XARELTO) 20 MG TABS tablet Take 1 tablet (20 mg total) by mouth daily. 30 tablet 1  . UNABLE TO FIND CPAP MACHINE with standard Aclaim nasal mask with humidifier. Set at 14 cwp (Patient taking differently: CPAP MACHINE with standard Aclaim nasal mask with humidifier. Set at 4 cwp) 1 each 0  . Vortioxetine HBr (TRINTELLIX PO) Take 25 mg by mouth at bedtime.     [name] zolpidem (AMBIEN) 10 MG tablet Take 10 mg by mouth at bedtime.      No current facility-administered medications on file prior to visit.     Allergies  Allergen Reactions  . Aspirin Swelling  . Ibuprofen Swelling  . Sustiva [Efavirenz] Swelling and Rash  . Nsaids Other (See Comments)     unknwn    Family History  Problem Relation Age of Onset  . Depression Brother   . Throat cancer Brother        half brother, never smoker  . COPD Mother   . Diabetes Neg Hx     BP 130/70 (BP Location: Right Arm, Patient Position: Sitting, Cuff Size: Normal)  Pulse (!) 106   Ht 5' 11"  (1.803 m)   Wt 250 lb 6.4 oz (113.6 kg)   SpO2 94%   BMI 34.92 kg/m   Review of Systems He denies hypoglycemia.     Objective:   Physical Exam VITAL SIGNS:  See vs page.  GENERAL: no distress. Pulses: left foot pulses are intact   MSK: no deformity of the feet or ankles, except the right foot is bandaged CV: 2+ bilat edema of the legs  Skin:  normal color and temp on the feet and ankles.  Neuro: sensation is intact to touch on the feet and ankles, but decreased from normal.  There is onychomycosis of the left foot toenails.  Lab Results  Component Value Date   HGBA1C 7.8 (A) 12/29/2017       Assessment & Plan:  Insulin-requiring type 2 DM, with PAD: this is the best control this pt should aim for, given this regimen, which does match insulin to his changing needs throughout the day   Patient Instructions  check your blood sugar twice a day.  vary the time of day when you check, between before the 3 meals, and at bedtime.  also check if you have symptoms of your blood sugar being too high or too low.  please keep a record of the readings and bring it to your next appointment here (or you can bring the meter itself).  You can write it on any piece of paper.  please call us sooner if your blood sugar goes below 70, or if you have a lot of readings over 200.   Please continue the same insulins.   Please come back for a follow-up appointment in 3 months.

## 2017-12-31 ENCOUNTER — Other Ambulatory Visit: Payer: Self-pay | Admitting: Family Medicine

## 2017-12-31 NOTE — Telephone Encounter (Signed)
Requested Prescriptions  Pending Prescriptions Disp Refills  . pantoprazole (PROTONIX) 40 MG tablet [Pharmacy Med Name: PANTOPRAZOLE 40MG TABLETS] 90 tablet 0    Sig: TAKE 1 TABLET(40 MG) BY MOUTH DAILY AT 6 AM     Gastroenterology: Proton Pump Inhibitors Passed - 12/31/2017  1:34 PM      Passed - Valid encounter within last 12 months    Recent Outpatient Visits          1 month ago History of DVT (deep vein thrombosis)   [facility] at [facility], Ranell Patrick, MD   3 months ago Weakness on right side of face   [facility], Ranell Patrick, MD   5 months ago Folliculitis   [facility], Ranell Patrick, MD   8 months ago Pain in both knees, unspecified chronicity   [facility], Ranell Patrick, MD   8 months ago Sore throat   [facility], Ranell Patrick, MD      Future Appointments            In 1 week Leandrew Koyanagi, MD [facility]   In 1 month Carlota Raspberry, Ranell Patrick, MD Primary Care at [place], [facility]

## 2018-01-07 ENCOUNTER — Telehealth (INDEPENDENT_AMBULATORY_CARE_PROVIDER_SITE_OTHER): Payer: Self-pay

## 2018-01-07 NOTE — Telephone Encounter (Signed)
Patient is approved for SynviscOne, bilateral knee. [place] Patient will be responsible for 20% OOP. Co-pay $15.00 No PA required  Appt. 01/13/2018 with [name]

## 2018-01-13 ENCOUNTER — Ambulatory Visit (INDEPENDENT_AMBULATORY_CARE_PROVIDER_SITE_OTHER): Payer: Self-pay

## 2018-01-13 ENCOUNTER — Ambulatory Visit (INDEPENDENT_AMBULATORY_CARE_PROVIDER_SITE_OTHER): Payer: PPO | Admitting: Orthopaedic Surgery

## 2018-01-13 ENCOUNTER — Encounter (INDEPENDENT_AMBULATORY_CARE_PROVIDER_SITE_OTHER): Payer: Self-pay | Admitting: Orthopaedic Surgery

## 2018-01-13 DIAGNOSIS — G8929 Other chronic pain: Secondary | ICD-10-CM

## 2018-01-13 DIAGNOSIS — M79672 Pain in left foot: Secondary | ICD-10-CM | POA: Diagnosis not present

## 2018-01-13 DIAGNOSIS — M25561 Pain in right knee: Secondary | ICD-10-CM

## 2018-01-13 DIAGNOSIS — M79671 Pain in right foot: Secondary | ICD-10-CM

## 2018-01-13 DIAGNOSIS — M25562 Pain in left knee: Secondary | ICD-10-CM

## 2018-01-13 MED ORDER — HYLAN G-F 20 48 MG/6ML IX SOSY
48.0000 mg | PREFILLED_SYRINGE | INTRA_ARTICULAR | Status: AC | PRN
  Administered 2018-01-13: 48 mg via INTRA_ARTICULAR

## 2018-01-13 MED ORDER — LIDOCAINE HCL 1 % IJ SOLN
3.0000 mL | INTRAMUSCULAR | Status: AC | PRN
  Administered 2018-01-13: 3 mL

## 2018-01-13 MED ORDER — BUPIVACAINE HCL 0.25 % IJ SOLN
0.6600 mL | INTRAMUSCULAR | Status: AC | PRN
  Administered 2018-01-13: .66 mL via INTRA_ARTICULAR

## 2018-01-13 MED ORDER — MUPIROCIN 2 % EX OINT: TOPICAL_OINTMENT | CUTANEOUS | 0 refills | Status: DC

## 2018-01-13 MED ORDER — DOXYCYCLINE HYCLATE 50 MG PO CAPS: 50.0000 mg | ORAL_CAPSULE | Freq: Two times a day (BID) | ORAL | 0 refills | Status: DC

## 2018-01-13 NOTE — Progress Notes (Signed)
   Procedure Note  Patient: Daniel [name]             Date of Birth: 1955-05-20           MRN: [id number]             Visit Date: 01/13/2018  Procedures: Visit Diagnoses: Chronic pain of both knees - Plan: Large Joint Inj: bilateral knee  Large Joint Inj: bilateral knee on 01/13/2018 4:25 PM Indications: pain Details: 22 G needle, anterolateral approach Medications (Right): 0.66 mL bupivacaine 0.25 %; 3 mL lidocaine 1 %; 48 mg Hylan 48 MG/6ML Medications (Left): 0.66 mL bupivacaine 0.25 %; 3 mL lidocaine 1 %; 48 mg Hylan 48 MG/6ML

## 2018-01-13 NOTE — Progress Notes (Signed)
Office Visit Note   Patient: Daniel [name]           Date of Birth: 1956/01/26           MRN: [id number] Visit Date: 01/13/2018              Requested by: [name], MD [address] [place], [place] [address] PCP: [name], MD   Assessment & Plan: Visit Diagnoses:  1. Chronic pain of both knees   2. Bilateral foot pain     Plan: Impression is bilateral foot sores.  We will start the patient on doxycycline.  He will wash with antibiotic soap daily.  He will apply mupirocin twice daily.  Follow-up with [name] in 2 weeks time for recheck.  Follow-Up Instructions: Return in about 2 weeks (around 01/27/2018).   Orders:  Orders Placed This Encounter  Procedures  . Large Joint Inj: bilateral knee  . XR Foot Complete Right  . XR Foot Complete Left   Meds ordered this encounter  Medications  . doxycycline (VIBRAMYCIN) 50 MG capsule    Sig: Take 1 capsule (50 mg total) by mouth 2 (two) times daily.    Dispense:  28 capsule    Refill:  0  . mupirocin ointment (BACTROBAN) 2 %    Sig: Apply to affected area 2 times daily    Dispense:  22 g    Refill:  0      Procedures: No procedures performed   Clinical Data: No additional findings.   Subjective: Chief Complaint  Patient presents with  . Right Knee - Pain  . Left Knee - Pain    HPI patient is a pleasant 62 year old gentleman who presents to our clinic today with concerns about both feet.  History of right trans-met amputation several months back.  Doing well with that.  His peripheral neuropathy and has noticed sores to the top and heels of both feet.  He has been treating these with wet-to-dry dressing changes.  He has noticed slight drainage to the right serosanguineous drainage to the right foot sore.  He denies any fevers or chills.    Review of Systems as detailed in HPI.  All others reviewed and are negative.   Objective: Vital Signs: There were no vitals taken for this visit.  Physical Exam  well-developed well-nourished gentleman in no acute distress.  Alert and x3.    Ortho Exam examination of the right foot reveals a nickel size sore to the dorsum and heel.  No active drainage.  No erythema and no increased tenderness.  Left foot reveals a small area of eschar with surrounding erythema to the dorsum of the foot.  Small dime size sore to the heel.  No surrounding erythema or drainage.  Specialty Comments:  No specialty comments available.  Imaging: Xr Foot Complete Left  Result Date: 01/13/2018 No evidence of osteomyelitis  Xr Foot Complete Right  Result Date: 01/13/2018 Status post transmet amputation without evidence of acute abnormalities.  Heterotopic ossification formation.    PMFS History: Patient Active Problem List   Diagnosis Date Noted  . Vaccine counseling 11/16/2017  . S/P transmetatarsal amputation of foot, right ([place]) 10/09/2017  . Deep vein thrombosis (DVT) ([place])   . Osteomyelitis ([place])   . Hypothyroidism   . Constipation   . History of DVT (deep vein thrombosis) 09/30/2017  . Morbid obesity due to excess calories ([place]) complicated by DM / hyperlipidemia 01/15/2017  . DOE (dyspnea on exertion) 01/13/2017  .  Chronic migraine 09/17/2016  . Gait abnormality 09/17/2016  . Diabetic foot infection ([place]) 06/28/2016  . Fall 12/05/2015  . Toe ulcer, right ([place]) 09/19/2015  . Decreased pedal pulses 09/19/2015  . OSA on CPAP 09/05/2015  . Major depressive disorder, recurrent episode, moderate ([place]) 09/05/2015  . Low back pain 06/11/2015  . Abnormality of gait 06/11/2015  . Dizziness 03/17/2015  . Weakness 02/21/2015  . Chronic renal insufficiency, stage III (moderate) ([place]) 08/09/2014  . Insulin-requiring or dependent type II diabetes mellitus ([place]) 11/07/2013  . Hematuria 06/21/2013  . Hepatic steatosis 09/09/2010  . Human immunodeficiency virus (HIV) disease ([place]) 06/04/2006  . HERPES ZOSTER, UNCOMPLICATED 27/78/2423  . THROMBOPHLEBITIS NOS  06/04/2006  . GERD 06/04/2006  . ARTHRITIS, HAND 06/04/2006   Past Medical History:  Diagnosis Date  . ADHD (attention deficit hyperactivity disorder)   . Anxiety   . Chronic kidney disease   . Clotting disorder ([place])   . Depression   . Diabetes mellitus without complication ([place])   . Diabetes mellitus, type II ([place])   . Dizziness 03/17/2015  . GERD (gastroesophageal reflux disease)   . HIV disease ([place])   . HIV infection ([place])   . Liver disease   . OSA (obstructive sleep apnea) 07/25/2015   Uses CPAP regularly  . Peripheral vascular disease ([place])   . Ulcer     Family History  Problem Relation Age of Onset  . Depression Brother   . Throat cancer Brother        half brother, never smoker  . COPD Mother   . Diabetes Neg Hx     Past Surgical History:  Procedure Laterality Date  . AMPUTATION Right 10/02/2017   Procedure: RIGHT TRANSMETATARSAL AMPUTATION;  Surgeon: Leandrew Koyanagi, MD;  Location: [facility];  Service: Orthopedics;  Laterality: Right;  . SMALL INTESTINE SURGERY    . STOMACH SURGERY    . TOE AMPUTATION Right 08/2016   right great toe   Social History   Occupational History    Comment: DISABILITY  Tobacco Use  . Smoking status: Former Smoker    Packs/day: 0.10    Years: 10.00    Pack years: 1.00    Types: Cigars    Last attempt to quit: 08/09/2014    Years since quitting: 3.4  . Smokeless tobacco: Never Used  Substance and Sexual Activity  . Alcohol use: No    Alcohol/week: 0.0 standard drinks  . Drug use: No  . Sexual activity: Not Currently    Partners: Male    Comment: pt. declined condoms

## 2018-01-14 ENCOUNTER — Other Ambulatory Visit: Payer: Self-pay | Admitting: Internal Medicine

## 2018-01-14 DIAGNOSIS — B20 Human immunodeficiency virus [HIV] disease: Secondary | ICD-10-CM

## 2018-01-15 DIAGNOSIS — G4733 Obstructive sleep apnea (adult) (pediatric): Secondary | ICD-10-CM | POA: Diagnosis not present

## 2018-01-19 ENCOUNTER — Encounter: Payer: Self-pay | Admitting: Neurology

## 2018-01-19 ENCOUNTER — Ambulatory Visit (INDEPENDENT_AMBULATORY_CARE_PROVIDER_SITE_OTHER): Payer: PPO | Admitting: Neurology

## 2018-01-19 VITALS — BP 142/92 | HR 93 | Ht 71.0 in | Wt 264.5 lb

## 2018-01-19 DIAGNOSIS — Z87898 Personal history of other specified conditions: Secondary | ICD-10-CM

## 2018-01-19 DIAGNOSIS — G43709 Chronic migraine without aura, not intractable, without status migrainosus: Secondary | ICD-10-CM

## 2018-01-19 DIAGNOSIS — E1142 Type 2 diabetes mellitus with diabetic polyneuropathy: Secondary | ICD-10-CM | POA: Diagnosis not present

## 2018-01-19 DIAGNOSIS — IMO0002 Reserved for concepts with insufficient information to code with codable children: Secondary | ICD-10-CM

## 2018-01-19 MED ORDER — TIZANIDINE HCL 4 MG PO TABS: 4.0000 mg | ORAL_TABLET | Freq: Four times a day (QID) | ORAL | 6 refills | Status: DC | PRN

## 2018-01-19 MED ORDER — DIVALPROEX SODIUM ER 500 MG PO TB24: 500.0000 mg | ORAL_TABLET | Freq: Every day | ORAL | 4 refills | Status: DC
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
## 2018-01-19 NOTE — Patient Instructions (Signed)
You may take  Tylenol Tizanidine (muscle relaxant) Zofran ( for nause)  As needed for prolonged severe headaches,

## 2018-01-25 ENCOUNTER — Telehealth: Payer: Self-pay | Admitting: Family Medicine

## 2018-01-25 DIAGNOSIS — Z87898 Personal history of other specified conditions: Secondary | ICD-10-CM | POA: Insufficient documentation

## 2018-01-25 DIAGNOSIS — E1142 Type 2 diabetes mellitus with diabetic polyneuropathy: Secondary | ICD-10-CM | POA: Insufficient documentation

## 2018-01-25 NOTE — Telephone Encounter (Signed)
Left a VM in regards to needing to know where he had his diabetic eye exam so we can obtain those records.

## 2018-01-27 ENCOUNTER — Encounter (INDEPENDENT_AMBULATORY_CARE_PROVIDER_SITE_OTHER): Payer: Self-pay | Admitting: Orthopaedic Surgery

## 2018-01-27 ENCOUNTER — Ambulatory Visit (INDEPENDENT_AMBULATORY_CARE_PROVIDER_SITE_OTHER): Payer: PPO | Admitting: Orthopaedic Surgery

## 2018-01-27 DIAGNOSIS — M25561 Pain in right knee: Secondary | ICD-10-CM

## 2018-01-27 DIAGNOSIS — G8929 Other chronic pain: Secondary | ICD-10-CM

## 2018-01-27 DIAGNOSIS — Z89431 Acquired absence of right foot: Secondary | ICD-10-CM | POA: Diagnosis not present

## 2018-01-27 DIAGNOSIS — M25562 Pain in left knee: Secondary | ICD-10-CM | POA: Diagnosis not present

## 2018-01-27 NOTE — Progress Notes (Signed)
Office Visit Note   Patient: Daniel [name]           Date of Birth: [birth year]           MRN: [id number] Visit Date: 01/27/2018              Requested by: Daniel Agreste, MD [address] [place], [place] [address] PCP: Daniel Agreste, MD   Assessment & Plan: Visit Diagnoses:  1. S/P transmetatarsal amputation of foot, right ([place])   2. Chronic pain of both knees     Plan: From my standpoint the feet are doing well.  Continue Bactroban ointment to the small area and the trans-met scar.  In terms of his knees I think this is product of him not walking for 3 months and being deconditioned as well as gained 15 pounds.  He uses Voltaren gel on a regular basis.  I will see him back in 6 weeks for wound check of his right foot.  Follow-Up Instructions: Return in about 6 weeks (around 03/10/2018).   Orders:  No orders of the defined types were placed in this encounter.  No orders of the defined types were placed in this encounter.     Procedures: No procedures performed   Clinical Data: No additional findings.   Subjective: Chief Complaint  Patient presents with  . Right Knee - Pain  . Left Knee - Pain  . Right Foot - Pain, Edema  . Left Foot - Edema, Pain    Daniel [name] follows up for bilateral knee pain and follow-up for his feet.  He states that the cortisone injections have not helped.   Review of Systems  Constitutional: Negative.   All other systems reviewed and are negative.    Objective: Vital Signs: There were no vitals taken for this visit.  Physical Exam  Constitutional: He is oriented to person, place, and time. He appears well-developed and well-nourished.  Pulmonary/Chest: Effort normal.  Abdominal: Soft.  Neurological: He is alert and oriented to person, place, and time.  Skin: Skin is warm.  Psychiatric: He has a normal mood and affect. His behavior is normal. Judgment and thought content normal.  Nursing note and vitals reviewed.   Ortho  Exam Bilateral knee exams are stable.  No joint effusion. The right foot exam shows a stable trans-met amputation and surgical scar.  The anterior scab has fallen off and the underlying skin is healthy.  The posterior scab is dry and without complication. The left foot dorsal wound has healed up completely.  No evidence of infection. Specialty Comments:  No specialty comments available.  Imaging: No results found.   PMFS History: Patient Active Problem List   Diagnosis Date Noted  . Diabetic peripheral neuropathy ([place]) 01/25/2018  . History of syncope 01/25/2018  . Vaccine counseling 11/16/2017  . S/P transmetatarsal amputation of foot, right (Al[place]) 10/09/2017  . Deep vein thrombosis (DVT) ([place])   . Osteomyelitis ([place])   . Hypothyroidism   . Constipation   . History of DVT (deep vein thrombosis) 09/30/2017  . Morbid obesity due to excess calories ([place]) complicated by DM / hyperlipidemia 01/15/2017  . DOE (dyspnea on exertion) 01/13/2017  . Chronic migraine 09/17/2016  . Gait abnormality 09/17/2016  . Diabetic foot infection ([place]) 06/28/2016  . Fall 12/05/2015  . Toe ulcer, right ([place]) 09/19/2015  . Decreased pedal pulses 09/19/2015  . OSA on CPAP 09/05/2015  . Major depressive disorder, recurrent episode, moderate ([place]) 09/05/2015  . Low back  pain 06/11/2015  . Abnormality of gait 06/11/2015  . Dizziness 03/17/2015  . Weakness 02/21/2015  . Chronic renal insufficiency, stage III (moderate) (Con[place]) 08/09/2014  . Insulin-requiring or dependent type II diabetes mellitus ([place]wood) 11/07/2013  . Hematuria 06/21/2013  . Hepatic steatosis 09/09/2010  . Human immunodeficiency virus (HIV) disease ([place]) 06/04/2006  . HERPES ZOSTER, UNCOMPLICATED 30/94/0768  . THROMBOPHLEBITIS NOS 06/04/2006  . GERD 06/04/2006  . ARTHRITIS, HAND 06/04/2006   Past Medical History:  Diagnosis Date  . ADHD (attention deficit hyperactivity disorder)   . Anxiety   . Chronic kidney disease   . Clotting  disorder ([place]ambe)   . Depression   . Diabetes mellitus without complication (Clifford)   . Diabetes mellitus, type II (Parker)   . Dizziness 03/17/2015  . GERD (gastroesophageal reflux disease)   . HIV disease ([place])   . HIV infection ([place])   . Liver disease   . OSA (obstructive sleep apnea) 07/25/2015   Uses CPAP regularly  . Peripheral vascular disease ([place])   . Ulcer     Family History  Problem Relation Age of Onset  . Depression Brother   . Throat cancer Brother        half brother, never smoker  . COPD Mother   . Diabetes Neg Hx     Past Surgical History:  Procedure Laterality Date  . AMPUTATION Right 10/02/2017   Procedure: RIGHT TRANSMETATARSAL AMPUTATION;  Surgeon: Leandrew Koyanagi, MD;  Location: [facility];  Service: Orthopedics;  Laterality: Right;  . SMALL INTESTINE SURGERY    . STOMACH SURGERY    . TOE AMPUTATION Right 08/2016   right great toe   Social History   Occupational History    Comment: DISABILITY  Tobacco Use  . Smoking status: Former Smoker    Packs/day: 0.10    Years: 10.00    Pack years: 1.00    Types: Cigars    Last attempt to quit: 08/09/2014    Years since quitting: 3.4  . Smokeless tobacco: Never Used  Substance and Sexual Activity  . Alcohol use: No    Alcohol/week: 0.0 standard drinks  . Drug use: No  . Sexual activity: Not Currently    Partners: Male    Comment: pt. declined condoms

## 2018-01-28 NOTE — Telephone Encounter (Signed)
Left another VM in regards to previous request

## 2018-01-29 NOTE — Telephone Encounter (Signed)
Pt states [facility]  on [address]

## 2018-02-01 ENCOUNTER — Other Ambulatory Visit (INDEPENDENT_AMBULATORY_CARE_PROVIDER_SITE_OTHER): Payer: Self-pay | Admitting: Physician Assistant

## 2018-02-01 ENCOUNTER — Telehealth: Payer: Self-pay | Admitting: Neurology

## 2018-02-01 NOTE — Telephone Encounter (Signed)
I have spoken with pt. and explained that [name] rx's Tizanidine 81m #20 per month as an adjunctive tx. for migraines, spasms. Ths is not intended to be taken on a scheduled basis--he should only take as needed, hopefully will not need more than a few times each week. Quantity of tablets not changed. He verbalized understanding of same/fim

## 2018-02-01 NOTE — Telephone Encounter (Signed)
Pt requesting a call to discuss if [name] can change the amount of medication of tiZANidine (ZANAFLEX) 4 MG tablet from 20 to 30 tablets.

## 2018-02-02 NOTE — Telephone Encounter (Signed)
Ok to refill 

## 2018-02-04 DIAGNOSIS — S233XXA Sprain of ligaments of thoracic spine, initial encounter: Secondary | ICD-10-CM | POA: Diagnosis not present

## 2018-02-04 DIAGNOSIS — S338XXA Sprain of other parts of lumbar spine and pelvis, initial encounter: Secondary | ICD-10-CM | POA: Diagnosis not present

## 2018-02-04 DIAGNOSIS — S134XXA Sprain of ligaments of cervical spine, initial encounter: Secondary | ICD-10-CM | POA: Diagnosis not present

## 2018-02-08 ENCOUNTER — Ambulatory Visit: Payer: PPO | Admitting: Family Medicine

## 2018-02-09 ENCOUNTER — Other Ambulatory Visit: Payer: Self-pay | Admitting: Family Medicine

## 2018-02-09 ENCOUNTER — Other Ambulatory Visit: Payer: Self-pay | Admitting: Endocrinology

## 2018-02-09 DIAGNOSIS — I82409 Acute embolism and thrombosis of unspecified deep veins of unspecified lower extremity: Secondary | ICD-10-CM

## 2018-02-12 ENCOUNTER — Encounter (INDEPENDENT_AMBULATORY_CARE_PROVIDER_SITE_OTHER): Payer: Self-pay | Admitting: Family Medicine

## 2018-02-12 ENCOUNTER — Ambulatory Visit (INDEPENDENT_AMBULATORY_CARE_PROVIDER_SITE_OTHER): Payer: PPO | Admitting: Family Medicine

## 2018-02-12 ENCOUNTER — Telehealth: Payer: Self-pay | Admitting: Neurology

## 2018-02-12 ENCOUNTER — Ambulatory Visit (INDEPENDENT_AMBULATORY_CARE_PROVIDER_SITE_OTHER): Payer: Self-pay

## 2018-02-12 ENCOUNTER — Ambulatory Visit (INDEPENDENT_AMBULATORY_CARE_PROVIDER_SITE_OTHER): Payer: PPO

## 2018-02-12 DIAGNOSIS — M79605 Pain in left leg: Secondary | ICD-10-CM

## 2018-02-12 DIAGNOSIS — M79604 Pain in right leg: Secondary | ICD-10-CM | POA: Diagnosis not present

## 2018-02-12 NOTE — Telephone Encounter (Signed)
Pt returning RNs call advised of what had been noted, and will f/u with PCP

## 2018-02-12 NOTE — Telephone Encounter (Signed)
Called, LVM for pt advising he should f/u with PCP to be further evaluated. Gave [facility] phone number if he has further questions or concerns.

## 2018-02-12 NOTE — Telephone Encounter (Signed)
Pt calling to inform that on 12-18 he fell when trying to get out of bed.  Pt states as a result of the fall his thighs are hurting and would like a call to discuss

## 2018-02-12 NOTE — Progress Notes (Signed)
Office Visit Note   Patient: Daniel [name]           Date of Birth: [birth year]           MRN: [id number] Visit Date: 02/12/2018 Requested by: [name], MD [address] [place], [place] [address] PCP: [name], MD  Subjective: Chief Complaint  Patient presents with  . pain bil legs (mainly thighs) post fall off sofa 2 days ago    HPI: He is here with bilateral leg pain.  Chronic pain in both of his knees.  The injections do not seem to be helping much.  A few days ago he got up out of his chair and fell, could not get up afterward.  He had to use a wheelchair for a day and then he was able to walk again but he has pain in his anterior thighs and his knee.  2 years ago he was diagnosed with some sort of seizure disorder and placed on Depakote.  Around that time he was falling all the time.  He had work-up including nerve studies, brain MRI scan, lumbar MRI scan, etc.  He did pretty well on Depakote but he thinks his recent fall was similar to his previous falling episodes.              ROS: He has diabetes, multiple other problems listed in the chart.  Objective: Vital Signs: There were no vitals taken for this visit.  Physical Exam:  Legs: Good range of motion and no significant pain with passive internal hip rotation bilaterally.  Tenderness in the quadriceps muscles on both sides.  Knees have no effusions today, full range of motion.  Imaging: X-Rays hips: Mild bilateral osteoarthritis.  X-Rays knees: Nearly bone-on-bone medial compartment DJD in both knees with moderate to severe patellofemoral DJD.  No sign of acute fracture.    Assessment & Plan: 1.  Bilateral leg pain, etiology uncertain.  Could be due to knee arthritis.  Cannot rule out PMR or myositis of some sort. -Labs to evaluate.  If labs are unremarkable, probably follow-up with [name] to discuss whether to consider knee replacement at some point.   Follow-Up Instructions: No follow-ups on file.        Procedures: No procedures performed  No notes on file    PMFS History: Patient Active Problem List   Diagnosis Date Noted  . Diabetic peripheral neuropathy (Henry) 01/25/2018  . History of syncope 01/25/2018  . Vaccine counseling 11/16/2017  . S/P transmetatarsal amputation of foot, right (Selma) 10/09/2017  . Deep vein thrombosis (DVT) (Bremen)   . Osteomyelitis (Paxton)   . Hypothyroidism   . Constipation   . History of DVT (deep vein thrombosis) 09/30/2017  . Morbid obesity due to excess calories (Milam) complicated by DM / hyperlipidemia 01/15/2017  . DOE (dyspnea on exertion) 01/13/2017  . Chronic migraine 09/17/2016  . Gait abnormality 09/17/2016  . Diabetic foot infection (Gladbrook) 06/28/2016  . Fall 12/05/2015  . Toe ulcer, right (Wolford) 09/19/2015  . Decreased pedal pulses 09/19/2015  . OSA on CPAP 09/05/2015  . Major depressive disorder, recurrent episode, moderate (G[place]) 09/05/2015  . Low back pain 06/11/2015  . Abnormality of gait 06/11/2015  . Dizziness 03/17/2015  . Weakness 02/21/2015  . Chronic renal insufficiency, stage III (moderate) ([place]) 08/09/2014  . Insulin-requiring or dependent type II diabetes mellitus ([place]banks) 11/07/2013  . Hematuria 06/21/2013  . Hepatic steatosis 09/09/2010  . Human immunodeficiency virus (HIV) disease ([place]) 06/04/2006  .  HERPES ZOSTER, UNCOMPLICATED 33/00/7622  . THROMBOPHLEBITIS NOS 06/04/2006  . GERD 06/04/2006  . ARTHRITIS, HAND 06/04/2006   Past Medical History:  Diagnosis Date  . ADHD (attention deficit hyperactivity disorder)   . Anxiety   . Chronic kidney disease   . Clotting disorder ([place] Hila[place])   . Depression   . Diabetes mellitus without complication (Macon)   . Diabetes mellitus, type II ([place] Dorado)   . Dizziness 03/17/2015  . GERD (gastroesophageal reflux disease)   . HIV disease ([place])   . HIV infection (Pal[place])   . Liver disease   . OSA (obstructive sleep apnea) 07/25/2015   Uses CPAP regularly  . Peripheral vascular disease (Smith[place])    . Ulcer     Family History  Problem Relation Age of Onset  . Depression Brother   . Throat cancer Brother        half brother, never smoker  . COPD Mother   . Diabetes Neg Hx     Past Surgical History:  Procedure Laterality Date  . AMPUTATION Right 10/02/2017   Procedure: RIGHT TRANSMETATARSAL AMPUTATION;  Surgeon: Leandrew Koyanagi, MD;  Location: [facility];  Service: Orthopedics;  Laterality: Right;  . SMALL INTESTINE SURGERY    . STOMACH SURGERY    . TOE AMPUTATION Right 08/2016   right great toe   Social History   Occupational History    Comment: DISABILITY  Tobacco Use  . Smoking status: Former Smoker    Packs/day: 0.10    Years: 10.00    Pack years: 1.00    Types: Cigars    Last attempt to quit: 08/09/2014    Years since quitting: 3.5  . Smokeless tobacco: Never Used  Substance and Sexual Activity  . Alcohol use: No    Alcohol/week: 0.0 standard drinks  . Drug use: No  . Sexual activity: Not Currently    Partners: Male    Comment: pt. declined condoms

## 2018-02-13 ENCOUNTER — Telehealth (INDEPENDENT_AMBULATORY_CARE_PROVIDER_SITE_OTHER): Payer: Self-pay | Admitting: Family Medicine

## 2018-02-13 LAB — SEDIMENTATION RATE: Sed Rate: 9 mm/h (ref 0–20)

## 2018-02-13 LAB — CK: Total CK: 63 U/L (ref 44–196)

## 2018-02-13 LAB — VITAMIN D 25 HYDROXY (VIT D DEFICIENCY, FRACTURES): Vit D, 25-Hydroxy: 23 ng/mL — ABNORMAL LOW (ref 30–100)

## 2018-02-13 LAB — C-REACTIVE PROTEIN: CRP: 14.7 mg/L — ABNORMAL HIGH (ref <8.0)

## 2018-02-13 NOTE — Telephone Encounter (Signed)
Labs show:  Vitamin D is low.  We want the level to be 50-80.  This can cause musculoskeletal pain.  I recommend taking vitamin D3 (5,000 IU tablets), two daily for three months (total 10,000 IU daily) and then 1 daily long-term after that.  CRP (inflammation marker) is elevated, but less than before.  ESR (another inflammation marker) is not elevated.  Not sure why CRP is up, but it's improving, so we'll monitor periodically.

## 2018-02-15 NOTE — Telephone Encounter (Signed)
Left full message with vitamin D3 instructions on patient's voice mail - permission given on ROI sheet in chart. Full message is also available in [place].  If he has questions on this, he can call us back or discuss this further at his followup appointment with [name] on 03/10/18.

## 2018-02-18 ENCOUNTER — Ambulatory Visit: Payer: PPO | Admitting: Cardiovascular Disease

## 2018-02-19 ENCOUNTER — Telehealth: Payer: Self-pay | Admitting: Family Medicine

## 2018-02-19 ENCOUNTER — Other Ambulatory Visit: Payer: Self-pay | Admitting: Neurology

## 2018-02-23 DIAGNOSIS — G4733 Obstructive sleep apnea (adult) (pediatric): Secondary | ICD-10-CM | POA: Diagnosis not present

## 2018-03-01 NOTE — Telephone Encounter (Signed)
[facility] called and said the patient does not have anymore refills on this medication. Patient was prescribed in September with 2 refills, patient takes this medication 3 times a day so he is currently out of this. Please Advise. pregabalin (LYRICA) 150 MG capsule  Fax number [phone] Phone number [phone]

## 2018-03-01 NOTE — Addendum Note (Signed)
Addended by: Matilde Sprang on: 03/01/2018 02:55 PM   Modules accepted: Orders

## 2018-03-04 ENCOUNTER — Other Ambulatory Visit: Payer: Self-pay | Admitting: Family Medicine

## 2018-03-04 ENCOUNTER — Ambulatory Visit: Payer: PPO | Admitting: Family Medicine

## 2018-03-04 MED ORDER — DICLOFENAC SODIUM 1 % TD GEL: TRANSDERMAL | 0 refills | Status: DC

## 2018-03-04 NOTE — Telephone Encounter (Signed)
Patient was told he needed to come in for an appt before the pregabalin (LYRICA) 150 MG capsule could be refilled.  He stated he had an appt for today with Dr. Nyoka Cowden but his ride cancelled, and he cannot drive right now because of the surgery where he had all of his toes amputated on his rgt foot.  He rescheduled his appt and it's not until 03/24/2018.  He does not want to run out of the medication.  So he would like a refill sent in to his pharmacy to cover him until his 03/24/2018 appt.

## 2018-03-04 NOTE — Telephone Encounter (Signed)
Requested Prescriptions  Pending Prescriptions Disp Refills  . diclofenac sodium (VOLTAREN) 1 % GEL 300 g 0    Sig: APPLY 2 GRAMS TO EACH KNEE IN THE MORNING AND AT BEDTIME AND APPLY 1 GRAM TO EACH KNEE IN THE AFTERNOON.     Analgesics:  Topicals Passed - 03/04/2018  3:45 PM      Passed - Valid encounter within last 12 months    Recent Outpatient Visits          3 months ago History of DVT (deep vein thrombosis)   [facility], Ranell Patrick, MD   6 months ago Weakness on right side of face   [facility], Ranell Patrick, MD   7 months ago Folliculitis   [facility], Ranell Patrick, MD   10 months ago Pain in both knees, unspecified chronicity   [facility], MD   10 months ago Sore throat   [facility], Ranell Patrick, MD      Future Appointments            In 6 days Leandrew Koyanagi, MD [facility]   In 2 weeks [name], MD [facility], [facility]

## 2018-03-04 NOTE — Telephone Encounter (Signed)
Copied from [place] [id number]. Topic: Quick Communication - See Telephone Encounter >> Mar 04, 2018  3:40 PM Ivar Drape wrote: CRM for notification. See Telephone encounter for: 03/04/18. Patient would like a refill on his diclofenac sodium (VOLTAREN) 1 % GEL medication and have it sent to his preferred pharmacy [facility] in [place].

## 2018-03-04 NOTE — Telephone Encounter (Signed)
pls see note.

## 2018-03-05 MED ORDER — PREGABALIN 150 MG PO CAPS: 150.0000 mg | ORAL_CAPSULE | Freq: Three times a day (TID) | ORAL | 0 refills | Status: DC

## 2018-03-05 NOTE — Addendum Note (Signed)
Addended by: [name] R on: 03/05/2018 06:36 PM   Modules accepted: Orders

## 2018-03-05 NOTE — Telephone Encounter (Addendum)
Refilled lyrica, as planned follow up.

## 2018-03-10 ENCOUNTER — Encounter (INDEPENDENT_AMBULATORY_CARE_PROVIDER_SITE_OTHER): Payer: Self-pay | Admitting: Orthopaedic Surgery

## 2018-03-10 ENCOUNTER — Ambulatory Visit (INDEPENDENT_AMBULATORY_CARE_PROVIDER_SITE_OTHER): Payer: PPO | Admitting: Orthopaedic Surgery

## 2018-03-10 ENCOUNTER — Other Ambulatory Visit: Payer: Self-pay | Admitting: Family Medicine

## 2018-03-10 DIAGNOSIS — Z89431 Acquired absence of right foot: Secondary | ICD-10-CM | POA: Diagnosis not present

## 2018-03-10 MED ORDER — DOXYCYCLINE HYCLATE 100 MG PO TABS: 100.0000 mg | ORAL_TABLET | Freq: Two times a day (BID) | ORAL | 0 refills | Status: DC

## 2018-03-10 NOTE — Progress Notes (Signed)
Office Visit Note   Patient: Daniel [name]           Date of Birth: Sep 24, 1955           MRN: [id number] Visit Date: 03/10/2018              Requested by: [name], MD [address] [place], [place] [address] PCP: [name], MD   Assessment & Plan: Visit Diagnoses:  1. S/P transmetatarsal amputation of foot, right ([place])     Plan: Impression is right incisional wound.  We will apply wet-to-dry dressing today.  He will continue wet-to-dry dressings twice daily at home.  We will start him on doxycycline.  He will follow-up with [name] in 3 weeks time for recheck.  Follow-Up Instructions: Return in about 3 weeks (around 03/31/2018).   Orders:  No orders of the defined types were placed in this encounter.  Meds ordered this encounter  Medications  . doxycycline (VIBRA-TABS) 100 MG tablet    Sig: Take 1 tablet (100 mg total) by mouth 2 (two) times daily.    Dispense:  20 tablet    Refill:  0      Procedures: No procedures performed   Clinical Data: No additional findings.   Subjective: Chief Complaint  Patient presents with  . Left Knee - Pain  . Right Knee - Pain  . Right Foot - Pain, Wound Check    HPI patient is a pleasant 63 year old gentleman who presents our clinic today 5 months status post right foot transmetatarsal amputation, date of surgery 10/02/2017.  He was doing well until last night when he picked a scab to the mid incision.  He then put on a sock which stuck to the underlying drainage.  When he removed it this morning he noticed slight drainage.  No pain however he has a diabetic with peripheral neuropathy.  In regards to his bilateral lower extremity pain, he has noticed significant treatment of symptoms since starting vitamin D3.  Review of Systems as detailed in HPI.  All others reviewed and are negative.   Objective: Vital Signs: There were no vitals taken for this visit.  Physical Exam well-developed and well-nourished gentleman in no  acute distress.  Alert and oriented x3.  Ortho Exam examination of the right foot incision reveals an open wound of approximately 0.5 cm.  Pink tissue bed.  No active drainage.    Specialty Comments:  No specialty comments available.  Imaging: No new imaging   PMFS History: Patient Active Problem List   Diagnosis Date Noted  . Diabetic peripheral neuropathy ([place]) 01/25/2018  . History of syncope 01/25/2018  . Vaccine counseling 11/16/2017  . S/P transmetatarsal amputation of foot, right (Wayne) 10/09/2017  . Deep vein thrombosis (DVT) ([place])   . Osteomyelitis (Lohman)   . Hypothyroidism   . Constipation   . History of DVT (deep vein thrombosis) 09/30/2017  . Morbid obesity due to excess calories (Luc[place]) complicated by DM / hyperlipidemia 01/15/2017  . DOE (dyspnea on exertion) 01/13/2017  . Chronic migraine 09/17/2016  . Gait abnormality 09/17/2016  . Diabetic foot infection (Bolton) 06/28/2016  . Fall 12/05/2015  . Toe ulcer, right (Wor[place]) 09/19/2015  . Decreased pedal pulses 09/19/2015  . OSA on CPAP 09/05/2015  . Major depressive disorder, recurrent episode, moderate (Gr[place]) 09/05/2015  . Low back pain 06/11/2015  . Abnormality of gait 06/11/2015  . Dizziness 03/17/2015  . Weakness 02/21/2015  . Chronic renal insufficiency, stage III (moderate) ([name])  08/09/2014  . Insulin-requiring or dependent type II diabetes mellitus (Brazos) 11/07/2013  . Hematuria 06/21/2013  . Hepatic steatosis 09/09/2010  . Human immunodeficiency virus (HIV) disease ([place]) 06/04/2006  . HERPES ZOSTER, UNCOMPLICATED 16/11/9602  . THROMBOPHLEBITIS NOS 06/04/2006  . GERD 06/04/2006  . ARTHRITIS, HAND 06/04/2006   Past Medical History:  Diagnosis Date  . ADHD (attention deficit hyperactivity disorder)   . Anxiety   . Chronic kidney disease   . Clotting disorder (Deerfield)   . Depression   . Diabetes mellitus without complication ([place])   . Diabetes mellitus, type II (L[place]all)   . Dizziness 03/17/2015  . GERD  (gastroesophageal reflux disease)   . HIV disease ([place])   . HIV infection (Crest[place])   . Liver disease   . OSA (obstructive sleep apnea) 07/25/2015   Uses CPAP regularly  . Peripheral vascular disease (Mules[place])   . Ulcer     Family History  Problem Relation Age of Onset  . Depression Brother   . Throat cancer Brother        half brother, never smoker  . COPD Mother   . Diabetes Neg Hx     Past Surgical History:  Procedure Laterality Date  . AMPUTATION Right 10/02/2017   Procedure: RIGHT TRANSMETATARSAL AMPUTATION;  Surgeon: Leandrew Koyanagi, MD;  Location: [facility];  Service: Orthopedics;  Laterality: Right;  . SMALL INTESTINE SURGERY    . STOMACH SURGERY    . TOE AMPUTATION Right 08/2016   right great toe   Social History   Occupational History    Comment: DISABILITY  Tobacco Use  . Smoking status: Former Smoker    Packs/day: 0.10    Years: 10.00    Pack years: 1.00    Types: Cigars    Last attempt to quit: 08/09/2014    Years since quitting: 3.5  . Smokeless tobacco: Never Used  Substance and Sexual Activity  . Alcohol use: No    Alcohol/week: 0.0 standard drinks  . Drug use: No  . Sexual activity: Not Currently    Partners: Male    Comment: pt. declined condoms

## 2018-03-23 ENCOUNTER — Ambulatory Visit: Payer: PPO | Admitting: Cardiovascular Disease

## 2018-03-24 ENCOUNTER — Ambulatory Visit (INDEPENDENT_AMBULATORY_CARE_PROVIDER_SITE_OTHER): Payer: PPO | Admitting: Family Medicine

## 2018-03-24 ENCOUNTER — Ambulatory Visit: Payer: PPO | Admitting: Cardiovascular Disease

## 2018-03-24 ENCOUNTER — Encounter: Payer: Self-pay | Admitting: Cardiovascular Disease

## 2018-03-24 ENCOUNTER — Encounter: Payer: Self-pay | Admitting: Family Medicine

## 2018-03-24 ENCOUNTER — Telehealth (HOSPITAL_COMMUNITY): Payer: Self-pay

## 2018-03-24 VITALS — BP 112/62 | HR 97 | Ht 71.0 in | Wt 268.0 lb

## 2018-03-24 VITALS — BP 116/75 | HR 104 | Temp 98.5°F | Resp 17 | Ht 71.0 in | Wt 270.0 lb

## 2018-03-24 DIAGNOSIS — E119 Type 2 diabetes mellitus without complications: Secondary | ICD-10-CM

## 2018-03-24 DIAGNOSIS — R0789 Other chest pain: Secondary | ICD-10-CM

## 2018-03-24 DIAGNOSIS — Z5181 Encounter for therapeutic drug level monitoring: Secondary | ICD-10-CM

## 2018-03-24 DIAGNOSIS — Z13220 Encounter for screening for lipoid disorders: Secondary | ICD-10-CM | POA: Diagnosis not present

## 2018-03-24 DIAGNOSIS — Z794 Long term (current) use of insulin: Secondary | ICD-10-CM | POA: Diagnosis not present

## 2018-03-24 DIAGNOSIS — E1142 Type 2 diabetes mellitus with diabetic polyneuropathy: Secondary | ICD-10-CM

## 2018-03-24 DIAGNOSIS — Z86718 Personal history of other venous thrombosis and embolism: Secondary | ICD-10-CM

## 2018-03-24 DIAGNOSIS — Z8669 Personal history of other diseases of the nervous system and sense organs: Secondary | ICD-10-CM

## 2018-03-24 DIAGNOSIS — Z7901 Long term (current) use of anticoagulants: Secondary | ICD-10-CM | POA: Diagnosis not present

## 2018-03-24 DIAGNOSIS — I82409 Acute embolism and thrombosis of unspecified deep veins of unspecified lower extremity: Secondary | ICD-10-CM

## 2018-03-24 LAB — BASIC METABOLIC PANEL
BUN / CREAT RATIO: 18 (ref 10–24)
BUN: 26 mg/dL (ref 8–27)
CALCIUM: 10 mg/dL (ref 8.6–10.2)
CO2: 22 mmol/L (ref 20–29)
Chloride: 100 mmol/L (ref 96–106)
Creatinine, Ser: 1.41 mg/dL — ABNORMAL HIGH (ref 0.76–1.27)
GFR calc Af Amer: 60 mL/min/{1.73_m2} (ref >59)
GFR calc non Af Amer: 52 mL/min/{1.73_m2} — ABNORMAL LOW (ref >59)
Glucose: 185 mg/dL — ABNORMAL HIGH (ref 65–99)
Potassium: 4.5 mmol/L (ref 3.5–5.2)
Sodium: 139 mmol/L (ref 134–144)

## 2018-03-24 MED ORDER — FUROSEMIDE 40 MG PO TABS: 40.0000 mg | ORAL_TABLET | Freq: Every day | ORAL | 5 refills | Status: DC

## 2018-03-24 MED ORDER — DIVALPROEX SODIUM ER 500 MG PO TB24: 500.0000 mg | ORAL_TABLET | Freq: Every day | ORAL | 1 refills | Status: DC

## 2018-03-24 MED ORDER — PREGABALIN 150 MG PO CAPS: 150.0000 mg | ORAL_CAPSULE | Freq: Three times a day (TID) | ORAL | 1 refills | Status: DC

## 2018-03-24 MED ORDER — RIVAROXABAN 20 MG PO TABS: ORAL_TABLET | ORAL | 1 refills | Status: DC

## 2018-03-24 NOTE — Progress Notes (Signed)
Cardiology Office Note   Date:  03/24/2018   ID:  Daniel, [name] 15-Aug-1955, MRN [id number]  PCP:  [name], MD  Cardiologist:   Skeet Latch, MD   No chief complaint on file.   Daniel [name] is a 63 y.o. male with HIV, DV/PE (11/2014), OSA on CPAP, and diabetes who presents for follow up on dizziness.   History of Present Illness:  Daniel [name] was fist evaluated 02/2015 for dizziness and falls.  The symptoms had been ongoing for several months.  He felt as though the room was spinning when changing position, leaning his head back or laying down.  He was referred for an echo 03/2015 that revealed LVEF 60-65% with mild LVH.  He also had carotid Dopplers 02/2015 that revealed minimal plaque bilaterally.  Additionally, he reported exertional dyspnea so he was referred for [facility] which was negative for ischemia. In the interim Daniel [name] also had nerve conduction studies that revealed peripheral neuropathy consistent with diabetic neuropathy. After his last appointment he was referred for a 30 day event monitor.  No arrhythmias were noted.   Since his last appointment Daniel [name] had more toes amputated on his R leg.  ABIs were normal 09/2015 prior to his great toe amputation.  He has been struggling due to edema and inability to drive.  He is waiting for the edema to go down so that he can drive.  He notes occasional episodes of chest discomfort. He is unsure whether it is his heart or reflux.  It only occurs at rest but he does not exert himself.  The episodes are sporadic and come/go without warning. He has chronic shortness of breath but it isn't any worse during these episodes.  There is no diaphoresis or nausea.  He has noted increased LE edema in both legs since his surgery.  He denies orthopnea or PND.  Of note, he self discontinued atorvastatin 07/2017 due to myalgias.    Past Medical History:  Diagnosis Date  . ADHD (attention deficit hyperactivity disorder)   .  Anxiety   . Chronic kidney disease   . Clotting disorder ([place])   . Depression   . Diabetes mellitus without complication ([place])   . Diabetes mellitus, type II ([place])   . Dizziness 03/17/2015  . GERD (gastroesophageal reflux disease)   . HIV disease ([place])   . HIV infection ([place])   . Liver disease   . OSA (obstructive sleep apnea) 07/25/2015   Uses CPAP regularly  . Peripheral vascular disease ([place])   . Ulcer     Past Surgical History:  Procedure Laterality Date  . AMPUTATION Right 10/02/2017   Procedure: RIGHT TRANSMETATARSAL AMPUTATION;  Surgeon: Leandrew Koyanagi, MD;  Location: [place];  Service: Orthopedics;  Laterality: Right;  . SMALL INTESTINE SURGERY    . STOMACH SURGERY    . TOE AMPUTATION Right 08/2016   right great toe    Current Outpatient Medications  Medication Sig Dispense Refill  . acetaminophen (TYLENOL) 500 MG tablet Take 1,000-1,500 mg by mouth every 8 (eight) hours as needed for moderate pain.     [name] ALPRAZolam (XANAX) 1 MG tablet Take 1 mg by mouth 4 (four) times daily as needed for anxiety.     [name] amphetamine-dextroamphetamine (ADDERALL) 30 MG tablet Take 30 mg by mouth 3 (three) times daily.    . Brexpiprazole (REXULTI) 1 MG TABS Take 1 mg by mouth at bedtime.    . diclofenac sodium (  VOLTAREN) 1 % GEL APPLY 2 GRAMS TO EACH KNEE IN THE MORNING AND AT BEDTIME AND APPLY 1 GRAM TO EACH KNEE IN THE AFTERNOON. 300 g 0  . divalproex (DEPAKOTE ER) 500 MG 24 hr tablet Take 1 tablet (500 mg total) by mouth at bedtime. 90 tablet 4  . glucose blood (FREESTYLE PRECISION NEO TEST) test strip Used to check blood sugars twice daily. 100 each 12  . insulin aspart (NOVOLOG FLEXPEN) 100 UNIT/ML FlexPen 10 units with breakfast, and 20 units with evening meal (Patient taking differently: Inject 10-20 Units into the skin See admin instructions. 10 units with breakfast, and 20 units with evening meal) 30 mL 11  . Insulin Glargine (LANTUS SOLOSTAR) 100 UNIT/ML Solostar Pen Inject 150 Units into  the skin every morning. 20 pen PRN  . Insulin Pen Needle (B-D ULTRAFINE III SHORT PEN) 31G X 8 MM MISC USE AS DIRECTED TO INJECT LANTUS AND NOVOLOG DAILY 100 each 0  . levothyroxine (SYNTHROID, LEVOTHROID) 50 MCG tablet Take 1 tablet (50 mcg total) by mouth at bedtime. 30 tablet 11  . mupirocin ointment (BACTROBAN) 2 % APPLY EXTERNALLY TO THE AFFECTED AREA TWICE DAILY 22 g 0  . ondansetron (ZOFRAN) 8 MG tablet Take 8 mg by mouth every 8 (eight) hours as needed for nausea or vomiting.    . pantoprazole (PROTONIX) 40 MG tablet TAKE 1 TABLET(40 MG) BY MOUTH DAILY AT 6 AM 90 tablet 0  . polyethylene glycol (MIRALAX / GLYCOLAX) packet Take 17 g by mouth 2 (two) times daily. 30 each 0  . pregabalin (LYRICA) 150 MG capsule Take 1 capsule (150 mg total) by mouth 3 (three) times daily. 90 capsule 0  . protriptyline (VIVACTIL) 10 MG tablet Take 10 mg by mouth 3 (three) times daily.   11  . tiZANidine (ZANAFLEX) 4 MG tablet Take 1 tablet (4 mg total) by mouth every 6 (six) hours as needed for muscle spasms. Do not refill in less than 30 days 20 tablet 6  . TRIUMEQ 600-50-300 MG tablet TAKE 1 TABLET BY MOUTH DAILY 30 tablet 4  . UNABLE TO FIND CPAP MACHINE with standard Aclaim nasal mask with humidifier. Set at 14 cwp (Patient taking differently: CPAP MACHINE with standard Aclaim nasal mask with humidifier. Set at 4 cwp) 1 each 0  . Vortioxetine HBr (TRINTELLIX PO) Take 25 mg by mouth at bedtime.     Alveda Reasons 20 MG TABS tablet TAKE 1 TABLET(20 MG) BY MOUTH DAILY 90 tablet 1  . zolpidem (AMBIEN) 10 MG tablet Take 10 mg by mouth at bedtime.     . furosemide (LASIX) 40 MG tablet Take 1 tablet (40 mg total) by mouth daily. 30 tablet 5   No current facility-administered medications for this visit.     Allergies:   Aspirin; Ibuprofen; Sustiva [efavirenz]; and Nsaids    Social History:  The patient  reports that he quit smoking about 3 years ago. His smoking use included cigars. He has a 1.00 pack-year smoking  history. He has never used smokeless tobacco. He reports that he does not drink alcohol or use drugs.   Family History:  The patient's family history includes COPD in his mother; Depression in his brother; Throat cancer in his brother.    ROS:  Please see the history of present illness.  Otherwise, review of systems are positive for chronic R LE edema.   All other systems are reviewed and negative.    PHYSICAL EXAM: VS:  BP 112/62  Pulse 97   Ht 5' 11"  (1.803 m)   Wt 268 lb (121.6 kg)   BMI 37.38 kg/m  , BMI Body mass index is 37.38 kg/m. GENERAL:  Well appearing HEENT: Pupils equal round and reactive, fundi not visualized, oral mucosa unremarkable NECK:  No jugular venous distention, waveform within normal limits, carotid upstroke brisk and symmetric, no bruits, no thyromegaly LYMPHATICS:  No cervical adenopathy LUNGS:  Clear to auscultation bilaterally HEART:  RRR.  PMI not displaced or sustained,S1 and S2 within normal limits, no S3, no S4, no clicks, no rubs, no murmurs ABD:  Flat, positive bowel sounds normal in frequency in pitch, no bruits, no rebound, no guarding, no midline pulsatile mass, no hepatomegaly, no splenomegaly EXT:  2 plus pulses throughout, 2+ LE edema to mid tibia bilaterally, no cyanosis no clubbing SKIN:  No rashes no nodules NEURO:  Cranial nerves II through XII grossly intact, motor grossly intact throughout PSYCH:  Cognitively intact, oriented to person place and time   EKG:  EKG is ordered today. The ekg ordered 03/16/15 demonstrates sinus rhythm.  Rate 90 bpm.  03/24/18: Sinus rhythm.  Rate 97 bpm.  Occasional PVCs.    Carotid Doppler 03/23/15: IMPRESSION: 1. Trace smooth heterogeneous plaque in the distal left common carotid artery. 2. No evidence of internal carotid plaque or stenosis bilaterally. 3. Vertebral arteries remain patent with normal antegrade flow.  Echo 04/05/15: Study Conclusions  - Left ventricle: The cavity size was normal. Wall  thickness was  increased in a pattern of mild LVH. Systolic function was normal.  The estimated ejection fraction was in the range of 60% to 65%.  Biplane speckle tracking LVEF was not accurately measured and  therefore not reported. Wall motion was normal; there were no  regional wall motion abnormalities. Left ventricular diastolic  function parameters were normal for the patient&'s age. - Aortic valve: Mildly calcified annulus. Trileaflet. - Mitral valve: Calcified annulus. There was trivial regurgitation. - Right atrium: Central venous pressure (est): 3 mm Hg. - Tricuspid valve: There was physiologic regurgitation. - Pulmonary arteries: Systolic pressure could not be accurately  estimated. - Pericardium, extracardiac: There was no pericardial effusion.  30 day Event Monitor 07/30/15:  Quality: Fair. Baseline artifact.  Sinus rhythm and sinus tachycardia noted during an episode of syncope.  Lexiscan Cardiolite 06/29/15:  There was no ST segment deviation noted during stress.  The study is normal.  This is a low risk study.  This study was not gated.  Recent Labs: 10/01/2017: Magnesium 2.0 11/04/2017: ALT 21; BUN 20; Creat 1.51; Hemoglobin 15.4; Platelets 201; Potassium 4.2; Sodium 139    Lipid Panel    Component Value Date/Time   CHOL 164 06/05/2016 1728   TRIG 356 (H) 06/05/2016 1728   HDL 37 (L) 06/05/2016 1728   CHOLHDL 4.4 06/05/2016 1728   CHOLHDL 3.3 08/09/2015 0956   VLDL 22 08/09/2015 0956   LDLCALC 56 06/05/2016 1728      Wt Readings from Last 3 Encounters:  03/24/18 268 lb (121.6 kg)  01/19/18 264 lb 8 oz (120 kg)  12/29/17 250 lb 6.4 oz (113.6 kg)      ASSESSMENT AND PLAN:  # Atypical chest pain: Symptoms are atypical.  However, he doesn't get any exercise to know if it is exertional.  He has multiple risk factors so we will get an exerise Myoview. He thinks that he can walk on a treadmill and would like to try.  He understands this will be  converted  if he is unstable on his feet.  # Hyperlipidemia:  He stopped his atorvastatin 2/2 myalgias.  We will check lipids and a CMP.  # LE edema: Lasix 93m daily.  Check BMP in 1 week.   # DVT: Recurrent, unprovoked DVT. Lifelong Xarelto.  # SOB: Stress and echo were unremarkable. Symptoms are likely due to deconditioning and obesity. Repeat stress test as above.   Current medicines are reviewed at length with the patient today.  The patient does not have concerns regarding medicines.  The following changes have been made:  no change  Labs/ tests ordered today include:   Orders Placed This Encounter  Procedures  . Basic metabolic panel  . Lipid panel  . Comprehensive metabolic panel  . MYOCARDIAL PERFUSION IMAGING  . EKG 12-Lead     Disposition:   FU with Terrin Imparato C. ROval Linsey MD, [facility]in [facility] months.    .  Signed, Rockell Faulks C. ROval Linsey MD, FWest [facility] 03/24/2018 1:12 PM    [facility]

## 2018-03-24 NOTE — Telephone Encounter (Signed)
Encounter complete. 

## 2018-03-24 NOTE — Patient Instructions (Addendum)
I do see some swelling in both legs. Compression stockings may help.  Can discuss those with the orthopedist but I would recommend using  compression stockings on both legs if they are okay with that. Fluid pill from cardiology today should help as well.   Additionally I would not recommend going up any further on the Lyrica right now as I think that could increase risk for more swelling without much added benefit for the pain relief.  I will prescribe the same dose for now but I did make a note to your neurologist to see if she has any recommendations.  I did see the note about Depakote for migraine prevention.  I will refill the same dose in the future if we need to but we need to monitor your liver tests and blood counts about every 6 months  Recheck with me in 6 months.   Return to the clinic or go to the nearest emergency room if any of your symptoms worsen or new symptoms occur.   If you have lab work done today you will be contacted with your lab results within the next 2 weeks.  If you have not heard from [name] then please contact us. The fastest way to get your results is to register for My Chart.   IF you received an x-ray today, you will receive an invoice from [facility]. Please contact [facility] at [phone] with questions or concerns regarding your invoice.   IF you received labwork today, you will receive an invoice from [facility]. Please contact [facility] at [phone] with questions or concerns regarding your invoice.   Our billing staff will not be able to assist you with questions regarding bills from these companies.  You will be contacted with the lab results as soon as they are available. The fastest way to get your results is to activate your My Chart account. Instructions are located on the last page of this paperwork. If you have not heard from [name] regarding the results in 2 weeks, please contact this office.

## 2018-03-24 NOTE — Patient Instructions (Signed)
Medication Instructions:  START FUROSEMIDE 40 MG DAILY  If you need a refill on your cardiac medications before your next appointment, please call your pharmacy.   Lab work: BMET TODAY   FASTING LP/CMET IN 1 WEE   If you have labs (blood work) drawn today and your tests are completely normal, you will receive your results only by: [name] MyChart Message (if you have MyChart) OR . A paper copy in the mail If you have any lab test that is abnormal or we need to change your treatment, we will call you to review the results.  Testing/Procedures: Your physician has requested that you have en exercise stress myoview. For further information please visit [email or web address]. Please follow instruction sheet, as given.   Follow-Up: At [facility], you and your health needs are our priority.  As part of our continuing mission to provide you with exceptional heart care, we have created designated Provider Care Teams.  These Care Teams include your primary Cardiologist (physician) and Advanced Practice Providers (APPs -  Physician Assistants and Nurse Practitioners) who all work together to provide you with the care you need, when you need it. You will need a follow up appointment in 3 months.  You may see DR [facility] or one of the following Advanced Practice Providers on your designated Care Team:   [name], PA-C Roby Lofts, [place] . [name], PA-C  Any Other Special Instructions Will Be Listed Below (If Applicable).   Cardiac Nuclear Scan A cardiac nuclear scan is a test that is done to check the flow of blood to your heart. It is done when you are resting and when you are exercising. The test looks for problems such as:  Not enough blood reaching a portion of the heart.  The heart muscle not working as it should. You may need this test if:  You have heart disease.  You have had lab results that are not normal.  You have had heart surgery or a balloon procedure to open up  blocked arteries (angioplasty).  You have chest pain.  You have shortness of breath. In this test, a special dye (tracer) is put into your bloodstream. The tracer will travel to your heart. A camera will then take pictures of your heart to see how the tracer moves through your heart. This test is usually done at a hospital and takes 2-4 hours. Tell a doctor about:  Any allergies you have.  All medicines you are taking, including vitamins, herbs, eye drops, creams, and over-the-counter medicines.  Any problems you or family members have had with anesthetic medicines.  Any blood disorders you have.  Any surgeries you have had.  Any medical conditions you have.  Whether you are pregnant or may be pregnant. What are the risks? Generally, this is a safe test. However, problems may occur, such as:  Serious chest pain and heart attack. This is only a risk if the stress portion of the test is done.  Rapid heartbeat.  A feeling of warmth in your chest. This feeling usually does not last long.  Allergic reaction to the tracer. What happens before the test?  Ask your doctor about changing or stopping your normal medicines. This is important.  Follow instructions from your doctor about what you cannot eat or drink.  Remove your jewelry on the day of the test. What happens during the test?  An IV tube will be inserted into one of your veins.  Your doctor will give you a  small amount of tracer through the IV tube.  You will wait for 20-40 minutes while the tracer moves through your bloodstream.  Your heart will be monitored with an electrocardiogram (ECG).  You will lie down on an exam table.  Pictures of your heart will be taken for about 15-20 minutes.  You may also have a stress test. For this test, one of these things may be done: ? You will be asked to exercise on a treadmill or a stationary bike. ? You will be given medicines that will make your heart work harder. This  is done if you are unable to exercise.  When blood flow to your heart has peaked, a tracer will again be given through the IV tube.  After 20-40 minutes, you will get back on the exam table. More pictures will be taken of your heart.  Depending on the tracer that is used, more pictures may need to be taken 3-4 hours later.  Your IV tube will be removed when the test is over. The test may vary among doctors and hospitals. What happens after the test?  Ask your doctor: ? Whether you can return to your normal schedule, including diet, activities, and medicines. ? Whether you should drink more fluids. This will help to remove the tracer from your body. Drink enough fluid to keep your pee (urine) pale yellow.  Ask your doctor, or the department that is doing the test: ? When will my results be ready? ? How will I get my results? Summary  A cardiac nuclear scan is a test that is done to check the flow of blood to your heart.  Tell your doctor whether you are pregnant or may be pregnant.  Before the test, ask your doctor about changing or stopping your normal medicines. This is important.  Ask your doctor whether you can return to your normal activities. You may be asked to drink more fluids. This information is not intended to replace advice given to you by your health care provider. Make sure you discuss any questions you have with your health care provider. Document Released: 07/27/2017 Document Revised: 07/27/2017 Document Reviewed: 07/27/2017 Elsevier Interactive Patient Education  2019 [employer].

## 2018-03-24 NOTE — Progress Notes (Signed)
Subjective:    Patient ID: Daniel [name], male    DOB: [birth year], 63 y.o.   MRN: [id number]  HPI Daniel [name] is a 63 y.o. male Presents today for: Chief Complaint  Patient presents with  . Follow-up    xarelto, peripheral neuropathy    Anticoagulation: Currently on Xarelto for history of DVT.  He had been hospitalized last August for osteomyelitis requiring transmetatarsal amputation of right foot.  Orthopedist is [name], office visit 2 weeks ago.  I last saw him November 16, 2017.  Xarelto was adjusted to 20 mg daily, and was tolerating that dose. Still taking 42m dose.  No new bleeding.  Lab Results  Component Value Date   WBC 8.6 11/04/2017   HGB 15.4 11/04/2017   HCT 44.3 11/04/2017   MCV 91.5 11/04/2017   PLT 201 11/04/2017    Peripheral neuropathic pain,  History of diabetes treated by Dr. ELoanne DrillingSee last office visit in September, he had been taking 200 mg of Lyrica twice daily but for improved control during the day we adjusted dosing to 150 mg 3 times per day.  Recommended continued follow-up with neurology. Still taking lyrica 1522mtid. Only fair improvement.  Later in visit stated foot pain is better o 45054mer day. No increased edema on higher dose.  Saw Dr. YanKrista Blue/26/19, note reviewed without changes in lyrica.   Lab Results  Component Value Date   CREATININE 1.51 (H) 11/04/2017     History of migraine: Sees neuro as above. Has been on Depakote ER 500m15ms for migraine prevention. Same dose for about a year. Does not remember discussing migraines. Feels like current dose working well. No falling episodes in past year to point of not being able to get up.  Lab Results  Component Value Date   WBC 8.6 11/04/2017   HGB 15.4 11/04/2017   HCT 44.3 11/04/2017   MCV 91.5 11/04/2017   PLT 201 11/04/2017   Lab Results  Component Value Date   ALT 21 11/04/2017   AST 23 11/04/2017   ALKPHOS 92 10/16/2017   BILITOT 0.7 11/04/2017     Followed by  cardiology as well - has lipid panel. BMP pending for next week. Started on Lasix today by cardiology - planned BMP in 1 week. Had CMP and lipids. Planned myoview for chest symptoms - this Friday.    Patient Active Problem List   Diagnosis Date Noted  . Diabetic peripheral neuropathy ([name])Daniel [name]  . History of syncope 01/25/2018  . Vaccine counseling 11/16/2017  . S/P transmetatarsal amputation of foot, right ([name])[place]/16/2019  . Deep vein thrombosis (DVT) ([name])[place]. Osteomyelitis ([name])[place]. Hypothyroidism   . Constipation   . History of DVT (deep vein thrombosis) 09/30/2017  . Morbid obesity due to excess calories ([name])[place] / hyperlipidemia 01/15/2017  . DOE (dyspnea on exertion) 01/13/2017  . Chronic migraine 09/17/2016  . Gait abnormality 09/17/2016  . Diabetic foot infection ([name])[place]/06/2016  . Fall 12/05/2015  . Toe ulcer, right ([name])[place]/26/2017  . Decreased pedal pulses 09/19/2015  . OSA on CPAP 09/05/2015  . Major depressive disorder, recurrent episode, moderate ([name])[place]/01/2016  . Low back pain 06/11/2015  . Abnormality of gait 06/11/2015  . Dizziness 03/17/2015  . Weakness 02/21/2015  . Chronic renal insufficiency, stage III (moderate) ([name])[place]/15/2016  . Insulin-requiring or dependent type II diabetes mellitus ([name])[place]/14/2015  . Hematuria 06/21/2013  . Hepatic steatosis 09/09/2010  .  Human immunodeficiency virus (HIV) disease ([place]) 06/04/2006  . HERPES ZOSTER, UNCOMPLICATED 83/41/9622  . THROMBOPHLEBITIS NOS 06/04/2006  . GERD 06/04/2006  . ARTHRITIS, HAND 06/04/2006   Past Medical History:  Diagnosis Date  . ADHD (attention deficit hyperactivity disorder)   . Anxiety   . Chronic kidney disease   . Clotting disorder ([place])   . Depression   . Diabetes mellitus without complication ([place])   . Diabetes mellitus, type II ([place])   . Dizziness 03/17/2015  . GERD (gastroesophageal reflux disease)   . HIV disease ([place])   . HIV infection ([place])   .  Liver disease   . OSA (obstructive sleep apnea) 07/25/2015   Uses CPAP regularly  . Peripheral vascular disease ([place])   . Ulcer    Past Surgical History:  Procedure Laterality Date  . AMPUTATION Right 10/02/2017   Procedure: RIGHT TRANSMETATARSAL AMPUTATION;  Surgeon: Leandrew Koyanagi, MD;  Location: [place];  Service: Orthopedics;  Laterality: Right;  . SMALL INTESTINE SURGERY    . STOMACH SURGERY    . TOE AMPUTATION Right 08/2016   right great toe   Allergies  Allergen Reactions  . Aspirin Swelling  . Ibuprofen Swelling  . Sustiva [Efavirenz] Swelling and Rash  . Nsaids Other (See Comments)    unknwn   Prior to Admission medications   Medication Sig Start Date End Date Taking? Authorizing Provider  acetaminophen (TYLENOL) 500 MG tablet Take 1,000-1,500 mg by mouth every 8 (eight) hours as needed for moderate pain.    Yes [provider]  ALPRAZolam Duanne Moron) 1 MG tablet Take 1 mg by mouth 4 (four) times daily as needed for anxiety.    Yes [provider]  amphetamine-dextroamphetamine (ADDERALL) 30 MG tablet Take 30 mg by mouth 3 (three) times daily.   Yes [provider]  Brexpiprazole (REXULTI) 1 MG TABS Take 1 mg by mouth at bedtime.   Yes [provider]  diclofenac sodium (VOLTAREN) 1 % GEL APPLY 2 GRAMS TO EACH KNEE IN THE MORNING AND AT BEDTIME AND APPLY 1 GRAM TO EACH KNEE IN THE AFTERNOON. 03/04/18  Yes [name], MD  divalproex (DEPAKOTE ER) 500 MG 24 hr tablet Take 1 tablet (500 mg total) by mouth at bedtime. 01/19/18  Yes Marcial Pacas, MD  furosemide (LASIX) 40 MG tablet Take 1 tablet (40 mg total) by mouth daily. 03/24/18 06/22/18 Yes Skeet Latch, MD  glucose blood (FREESTYLE PRECISION NEO TEST) test strip Used to check blood sugars twice daily. 07/28/17  Yes Renato Shin, MD  insulin aspart (NOVOLOG FLEXPEN) 100 UNIT/ML FlexPen 10 units with breakfast, and 20 units with evening meal Patient taking differently: Inject 10-20 Units into  the skin See admin instructions. 10 units with breakfast, and 20 units with evening meal 09/21/17  Yes Renato Shin, MD  Insulin Glargine (LANTUS SOLOSTAR) 100 UNIT/ML Solostar Pen Inject 150 Units into the skin every morning. 09/21/17  Yes Renato Shin, MD  Insulin Pen Needle (B-D ULTRAFINE III SHORT PEN) 31G X 8 MM MISC USE AS DIRECTED TO INJECT LANTUS AND NOVOLOG DAILY 02/09/18  Yes Renato Shin, MD  levothyroxine (SYNTHROID, LEVOTHROID) 50 MCG tablet Take 1 tablet (50 mcg total) by mouth at bedtime. 07/24/15  Yes [name], MD  mupirocin ointment (BACTROBAN) 2 % APPLY EXTERNALLY TO THE AFFECTED AREA TWICE DAILY 02/02/18  Yes Dwana Melena L, PA-C  ondansetron (ZOFRAN) 8 MG tablet Take 8 mg by mouth every 8 (eight) hours as needed for nausea or  vomiting.   Yes [provider]  pantoprazole (PROTONIX) 40 MG tablet TAKE 1 TABLET(40 MG) BY MOUTH DAILY AT 6 AM 03/10/18  Yes [name], MD  polyethylene glycol [facility] / GLYCOLAX) packet Take 17 g by mouth 2 (two) times daily. 10/05/17  Yes Eugenie Filler, MD  pregabalin (LYRICA) 150 MG capsule Take 1 capsule (150 mg total) by mouth 3 (three) times daily. 03/05/18  Yes [name], MD  protriptyline (VIVACTIL) 10 MG tablet Take 10 mg by mouth 3 (three) times daily.  01/30/16  Yes [provider]  tiZANidine (ZANAFLEX) 4 MG tablet Take 1 tablet (4 mg total) by mouth every 6 (six) hours as needed for muscle spasms. Do not refill in less than 30 days 01/19/18  Yes Marcial Pacas, MD  TRIUMEQ 600-50-300 MG tablet TAKE 1 TABLET BY MOUTH DAILY 01/14/18  Yes Comer, Okey Regal, MD  UNABLE TO FIND CPAP MACHINE with standard Aclaim nasal mask with humidifier. Set at 14 cwp Patient taking differently: CPAP MACHINE with standard Aclaim nasal mask with humidifier. Set at 4 cwp 04/12/13  Yes Daub, Loura Back, MD  Vortioxetine HBr (TRINTELLIX PO) Take 25 mg by mouth at bedtime.  01/26/15  Yes [provider]  XARELTO 20 MG TABS  tablet TAKE 1 TABLET(20 MG) BY MOUTH DAILY 02/09/18  Yes [name], MD  zolpidem (AMBIEN) 10 MG tablet Take 10 mg by mouth at bedtime.    Yes [provider]   Social History   Socioeconomic History  . Marital status: Single    Spouse name: Not on file  . Number of children: Not on file  . Years of education: Not on file  . Highest education level: Not on file  Occupational History    Comment: DISABILITY  Social Needs  . Financial resource strain: Not hard at all  . Food insecurity:    Worry: Never true    Inability: Never true  . Transportation needs:    Medical: No    Non-medical: No  Tobacco Use  . Smoking status: Former Smoker    Packs/day: 0.10    Years: 10.00    Pack years: 1.00    Types: Cigars    Last attempt to quit: 08/09/2014    Years since quitting: 3.6  . Smokeless tobacco: Never Used  Substance and Sexual Activity  . Alcohol use: No    Alcohol/week: 0.0 standard drinks  . Drug use: No  . Sexual activity: Not Currently    Partners: Male    Comment: pt. declined condoms  Lifestyle  . Physical activity:    Days per week: 0 days    Minutes per session: 0 min  . Stress: Only a little  Relationships  . Social connections:    Talks on phone: More than three times a week    Gets together: Once a week    Attends religious service: Never    Active member of club or organization: No    Attends meetings of clubs or organizations: Not on file    Relationship status: Never married  . Intimate partner violence:    Fear of current or ex partner: No    Emotionally abused: No    Physically abused: No    Forced sexual activity: No  Other Topics Concern  . Not on file  Social History Narrative   Epworth Sleepiness Scale = 7 (as of 03/16/2015)    Review of Systems Per HPI    Objective:  Physical Exam Vitals signs reviewed.  Constitutional:      Appearance: He is well-developed.  HENT:     Head: Normocephalic and atraumatic.  Eyes:      Pupils: Pupils are equal, round, and reactive to light.  Neck:     Vascular: No carotid bruit or JVD.  Cardiovascular:     Rate and Rhythm: Normal rate and regular rhythm.     Heart sounds: Normal heart sounds. No murmur.  Pulmonary:     Effort: Pulmonary effort is normal.     Breath sounds: Normal breath sounds. No rales.  Musculoskeletal:     Right lower leg: Edema (1-2+ mid tibia. ) present.     Left lower leg: Edema present.  Skin:    General: Skin is warm and dry.  Neurological:     Mental Status: He is alert and oriented to person, place, and time.    Vitals:   03/24/18 1449  BP: 116/75  Pulse: (!) 104  Resp: 17  Temp: 98.5 F (36.9 C)  TempSrc: Oral  SpO2: 98%  Weight: 270 lb (122.5 kg)  Height: 5' 11"  (1.803 m)       Assessment & Plan:   Daniel [name] is a 63 y.o. male Chronic anticoagulation - Plan: CBC, Protime-INR History of DVT (deep vein thrombosis)  -Check CBC, continue Xarelto.  PT/INR checked for use of Depakote.  Diabetic peripheral neuropathy ([name]) - Plan: pregabalin (LYRICA) 150 MG capsule  -Improved symptoms with Lyrica at 450 mg/day.  We did discuss potential peripheral edema and increased adverse drug reaction likelihood of increasing doses with minimal potential benefit.  Decided to remain at same dose for now but message sent to his neurologist to see if other recommendations for treatment  Medication monitoring encounter - Plan: CBC, Protime-INR History of migraine - Plan: divalproex (DEPAKOTE ER) 500 MG 24 hr tablet  -Refilled Depakote at same dose for migraine prevention.  Check CBC, INR.  LFTs to be obtained at cardiology with recent testing  Deep vein thrombosis (DVT) ([place]) - Plan: rivaroxaban (XARELTO) 20 MG TABS tablet  -As above  Meds ordered this encounter  Medications  . divalproex (DEPAKOTE ER) 500 MG 24 hr tablet    Sig: Take 1 tablet (500 mg total) by mouth at bedtime.    Dispense:  90 tablet    Refill:  1  . pregabalin  (LYRICA) 150 MG capsule    Sig: Take 1 capsule (150 mg total) by mouth 3 (three) times daily.    Dispense:  270 capsule    Refill:  1  . rivaroxaban (XARELTO) 20 MG TABS tablet    Sig: TAKE 1 TABLET(20 MG) BY MOUTH DAILY    Dispense:  90 tablet    Refill:  1   Patient Instructions   I do see some swelling in both legs. Compression stockings may help.  Can discuss those with the orthopedist but I would recommend using  compression stockings on both legs if they are okay with that. Fluid pill from cardiology today should help as well.   Additionally I would not recommend going up any further on the Lyrica right now as I think that could increase risk for more swelling without much added benefit for the pain relief.  I will prescribe the same dose for now but I did make a note to your neurologist to see if she has any recommendations.  I did see the note about Depakote for migraine prevention.  I will refill  the same dose in the future if we need to but we need to monitor your liver tests and blood counts about every 6 months  Recheck with me in 6 months.   Return to the clinic or go to the nearest emergency room if any of your symptoms worsen or new symptoms occur.   If you have lab work done today you will be contacted with your lab results within the next 2 weeks.  If you have not heard from [name] then please contact us. The fastest way to get your results is to register for My Chart.   IF you received an x-ray today, you will receive an invoice from [facility]. Please contact [facility] at [phone] with questions or concerns regarding your invoice.   IF you received labwork today, you will receive an invoice from [facility]. Please contact [facility] at [phone] with questions or concerns regarding your invoice.   Our billing staff will not be able to assist you with questions regarding bills from these companies.  You will be contacted with the lab results as soon  as they are available. The fastest way to get your results is to activate your My Chart account. Instructions are located on the last page of this paperwork. If you have not heard from [name] regarding the results in 2 weeks, please contact this office.       Signed,   [name], MD [facility].  03/24/18 6:24 PM

## 2018-03-25 ENCOUNTER — Other Ambulatory Visit: Payer: Self-pay | Admitting: Endocrinology

## 2018-03-25 LAB — CBC
Hematocrit: 47.3 % (ref 37.5–51.0)
Hemoglobin: 16.7 g/dL (ref 13.0–17.7)
MCH: 30.9 pg (ref 26.6–33.0)
MCHC: 35.3 g/dL (ref 31.5–35.7)
MCV: 88 fL (ref 79–97)
Platelets: 169 10*3/uL (ref 150–450)
RBC: 5.4 x10E6/uL (ref 4.14–5.80)
RDW: 15.6 % — ABNORMAL HIGH (ref 11.6–15.4)
WBC: 7.6 10*3/uL (ref 3.4–10.8)

## 2018-03-25 LAB — PROTIME-INR
INR: 1.1 (ref 0.8–1.2)
Prothrombin Time: 11.4 s (ref 9.1–12.0)

## 2018-03-26 ENCOUNTER — Encounter (HOSPITAL_COMMUNITY): Payer: PPO

## 2018-03-29 ENCOUNTER — Telehealth: Payer: Self-pay | Admitting: Cardiovascular Disease

## 2018-03-29 NOTE — Telephone Encounter (Signed)
Can you please advise with Dr.Ranolph if we can change this order to [place]?  Thanks!

## 2018-03-29 NOTE — Telephone Encounter (Signed)
New Message:     Pt says he is scheduled for a treadmill on Thursday. He says he needs the chemical one instead of the physical one at this time please.

## 2018-03-30 ENCOUNTER — Telehealth (HOSPITAL_COMMUNITY): Payer: Self-pay

## 2018-03-30 NOTE — Telephone Encounter (Signed)
Encounter complete. 

## 2018-03-31 ENCOUNTER — Encounter (INDEPENDENT_AMBULATORY_CARE_PROVIDER_SITE_OTHER): Payer: Self-pay | Admitting: Orthopaedic Surgery

## 2018-03-31 ENCOUNTER — Ambulatory Visit (INDEPENDENT_AMBULATORY_CARE_PROVIDER_SITE_OTHER): Payer: PPO | Admitting: Endocrinology

## 2018-03-31 ENCOUNTER — Encounter: Payer: Self-pay | Admitting: Endocrinology

## 2018-03-31 ENCOUNTER — Ambulatory Visit (INDEPENDENT_AMBULATORY_CARE_PROVIDER_SITE_OTHER): Payer: PPO | Admitting: Orthopaedic Surgery

## 2018-03-31 VITALS — BP 104/62 | HR 105 | Ht 71.0 in | Wt 262.2 lb

## 2018-03-31 DIAGNOSIS — N189 Chronic kidney disease, unspecified: Secondary | ICD-10-CM | POA: Diagnosis not present

## 2018-03-31 DIAGNOSIS — E0849 Diabetes mellitus due to underlying condition with other diabetic neurological complication: Secondary | ICD-10-CM | POA: Diagnosis not present

## 2018-03-31 DIAGNOSIS — Z13220 Encounter for screening for lipoid disorders: Secondary | ICD-10-CM | POA: Diagnosis not present

## 2018-03-31 DIAGNOSIS — Z794 Long term (current) use of insulin: Secondary | ICD-10-CM

## 2018-03-31 DIAGNOSIS — R200 Anesthesia of skin: Secondary | ICD-10-CM | POA: Diagnosis not present

## 2018-03-31 DIAGNOSIS — Z5181 Encounter for therapeutic drug level monitoring: Secondary | ICD-10-CM | POA: Diagnosis not present

## 2018-03-31 DIAGNOSIS — I739 Peripheral vascular disease, unspecified: Secondary | ICD-10-CM | POA: Diagnosis not present

## 2018-03-31 DIAGNOSIS — Z89431 Acquired absence of right foot: Secondary | ICD-10-CM

## 2018-03-31 DIAGNOSIS — E119 Type 2 diabetes mellitus without complications: Secondary | ICD-10-CM | POA: Diagnosis not present

## 2018-03-31 LAB — POCT GLYCOSYLATED HEMOGLOBIN (HGB A1C): Hemoglobin A1C: 9.8 % — AB (ref 4.0–5.6)

## 2018-03-31 MED ORDER — INSULIN GLARGINE 100 UNIT/ML SOLOSTAR PEN: 170.0000 [IU] | PEN_INJECTOR | SUBCUTANEOUS | 99 refills | Status: DC

## 2018-03-31 NOTE — Patient Instructions (Addendum)
check your blood sugar twice a day.  vary the time of day when you check, between before the 3 meals, and at bedtime.  also check if you have symptoms of your blood sugar being too high or too low.  please keep a record of the readings and bring it to your next appointment here (or you can bring the meter itself).  You can write it on any piece of paper.  please call us sooner if your blood sugar goes below 70, or if you have a lot of readings over 200.   I have sent a prescription to your pharmacy, to increase the lantus, and: Please continue the same novolog. Please call or message [name] next week, to tell us how the blood sugar is doing.   Please come back for a follow-up appointment in 2 months.

## 2018-03-31 NOTE — Progress Notes (Signed)
Office Visit Note   Patient: Daniel [name]           Date of Birth: [birth year]           MRN: [id number] Visit Date: 03/31/2018              Requested by: [name], MD [address] [place], [place] [address] PCP: [name], MD   Assessment & Plan: Visit Diagnoses:  1. S/P transmetatarsal amputation of foot, right ([place])     Plan: At this point I recommend Bactroban ointment to the blisters.  I think the Ace bandage is being placed too tightly and causing them so I have recommended that he stop using the Ace bandage.  We will send him to [facility] for a prosthesis for his transmetatarsal amputation.  We will see him back as needed.  Follow-Up Instructions: Return if symptoms worsen or fail to improve.   Orders:  No orders of the defined types were placed in this encounter.  No orders of the defined types were placed in this encounter.     Procedures: No procedures performed   Clinical Data: No additional findings.   Subjective: Chief Complaint  Patient presents with  . Right Foot - Pain    Butch Penny returns today for wound check.  Overall he is doing okay.  He is no longer taking antibiotics.   Review of Systems   Objective: Vital Signs: There were no vitals taken for this visit.  Physical Exam  Ortho Exam Right foot exam which shows a fully healed transmetatarsal amputation surgical scar.  He does have some superficial blisters on the top of his foot and the back of his ankle from the Ace wrap.  No evidence of infection. Specialty Comments:  No specialty comments available.  Imaging: No results found.   PMFS History: Patient Active Problem List   Diagnosis Date Noted  . Diabetic peripheral neuropathy ([place]) 01/25/2018  . History of syncope 01/25/2018  . Vaccine counseling 11/16/2017  . S/P transmetatarsal amputation of foot, right ([place]) 10/09/2017  . Deep vein thrombosis (DVT) ([place])   . Osteomyelitis ([place])   . Hypothyroidism     . Constipation   . History of DVT (deep vein thrombosis) 09/30/2017  . Morbid obesity due to excess calories ([place]) complicated by DM / hyperlipidemia 01/15/2017  . DOE (dyspnea on exertion) 01/13/2017  . Chronic migraine 09/17/2016  . Gait abnormality 09/17/2016  . Diabetic foot infection ([place]) 06/28/2016  . Fall 12/05/2015  . Toe ulcer, right ([place]) 09/19/2015  . Decreased pedal pulses 09/19/2015  . OSA on CPAP 09/05/2015  . Major depressive disorder, recurrent episode, moderate ([place]) 09/05/2015  . Low back pain 06/11/2015  . Abnormality of gait 06/11/2015  . Dizziness 03/17/2015  . Weakness 02/21/2015  . Chronic renal insufficiency, stage III (moderate) ([place]) 08/09/2014  . Insulin-requiring or dependent type II diabetes mellitus ([place]) 11/07/2013  . Hematuria 06/21/2013  . Hepatic steatosis 09/09/2010  . Human immunodeficiency virus (HIV) disease ([place]) 06/04/2006  . HERPES ZOSTER, UNCOMPLICATED 35/32/9924  . THROMBOPHLEBITIS NOS 06/04/2006  . GERD 06/04/2006  . ARTHRITIS, HAND 06/04/2006   Past Medical History:  Diagnosis Date  . ADHD (attention deficit hyperactivity disorder)   . Anxiety   . Chronic kidney disease   . Clotting disorder ([place])   . Depression   . Diabetes mellitus without complication ([place])   . Diabetes mellitus, type II ([place])   . Dizziness 03/17/2015  . GERD (gastroesophageal reflux disease)   .  HIV disease ([place])   . HIV infection ([place])   . Liver disease   . OSA (obstructive sleep apnea) 07/25/2015   Uses CPAP regularly  . Peripheral vascular disease ([place])   . Ulcer     Family History  Problem Relation Age of Onset  . Depression Brother   . Throat cancer Brother        half brother, never smoker  . COPD Mother   . Diabetes Neg Hx     Past Surgical History:  Procedure Laterality Date  . AMPUTATION Right 10/02/2017   Procedure: RIGHT TRANSMETATARSAL AMPUTATION;  Surgeon: Leandrew Koyanagi, MD;  Location: [place];  Service: Orthopedics;  Laterality: Right;   . SMALL INTESTINE SURGERY    . STOMACH SURGERY    . TOE AMPUTATION Right 08/2016   right great toe   Social History   Occupational History    Comment: DISABILITY  Tobacco Use  . Smoking status: Former Smoker    Packs/day: 0.10    Years: 10.00    Pack years: 1.00    Types: Cigars    Last attempt to quit: 08/09/2014    Years since quitting: 3.6  . Smokeless tobacco: Never Used  Substance and Sexual Activity  . Alcohol use: No    Alcohol/week: 0.0 standard drinks  . Drug use: No  . Sexual activity: Not Currently    Partners: Male    Comment: pt. declined condoms

## 2018-03-31 NOTE — Progress Notes (Signed)
Subjective:    Patient ID: Daniel [name], male    DOB: Oct 21, 1955, 63 y.o.   MRN: [id number]  HPI Pt returns for f/u of diabetes mellitus: DM type: Insulin-requiring type 2.  Dx'ed: 3329 Complications: polyneuropathy, PAD, renal insufficiency, and foot ulcers.  Therapy: insulin since 2016. DKA: never Severe hypoglycemia: never.  Pancreatitis: never.   Other: he takes multiple daily injections, but basal insulin is emphasized, with improved results.  Interval history: I reviewed continuous glucose monitor data.  Glucose varies from 150-370.  There is no trend throughout the day.  Pt says he seldom misses the insulin   Past Medical History:  Diagnosis Date  . ADHD (attention deficit hyperactivity disorder)   . Anxiety   . Chronic kidney disease   . Clotting disorder ([place])   . Depression   . Diabetes mellitus without complication ([place])   . Diabetes mellitus, type II ([place])   . Dizziness 03/17/2015  . GERD (gastroesophageal reflux disease)   . HIV disease ([place])   . HIV infection ([place])   . Liver disease   . OSA (obstructive sleep apnea) 07/25/2015   Uses CPAP regularly  . Peripheral vascular disease ([place])   . Ulcer     Past Surgical History:  Procedure Laterality Date  . AMPUTATION Right 10/02/2017   Procedure: RIGHT TRANSMETATARSAL AMPUTATION;  Surgeon: Leandrew Koyanagi, MD;  Location: [facility];  Service: Orthopedics;  Laterality: Right;  . SMALL INTESTINE SURGERY    . STOMACH SURGERY    . TOE AMPUTATION Right 08/2016   right great toe    Social History   Socioeconomic History  . Marital status: Single    Spouse name: Not on file  . Number of children: Not on file  . Years of education: Not on file  . Highest education level: Not on file  Occupational History    Comment: DISABILITY  Social Needs  . Financial resource strain: Not hard at all  . Food insecurity:    Worry: Never true    Inability: Never true  . Transportation needs:    Medical: No    Non-medical: No    Tobacco Use  . Smoking status: Former Smoker    Packs/day: 0.10    Years: 10.00    Pack years: 1.00    Types: Cigars    Last attempt to quit: 08/09/2014    Years since quitting: 3.6  . Smokeless tobacco: Never Used  Substance and Sexual Activity  . Alcohol use: No    Alcohol/week: 0.0 standard drinks  . Drug use: No  . Sexual activity: Not Currently    Partners: Male    Comment: pt. declined condoms  Lifestyle  . Physical activity:    Days per week: 0 days    Minutes per session: 0 min  . Stress: Only a little  Relationships  . Social connections:    Talks on phone: More than three times a week    Gets together: Once a week    Attends religious service: Never    Active member of club or organization: No    Attends meetings of clubs or organizations: Not on file    Relationship status: Never married  . Intimate partner violence:    Fear of current or ex partner: No    Emotionally abused: No    Physically abused: No    Forced sexual activity: No  Other Topics Concern  . Not on file  Social History Narrative   Epworth Sleepiness  Scale = 7 (as of 03/16/2015)    Current Outpatient Medications on File Prior to Visit  Medication Sig Dispense Refill  . acetaminophen (TYLENOL) 500 MG tablet Take 1,000-1,500 mg by mouth every 8 (eight) hours as needed for moderate pain.     [name] ALPRAZolam (XANAX) 1 MG tablet Take 1 mg by mouth 4 (four) times daily as needed for anxiety.     [name] amphetamine-dextroamphetamine (ADDERALL) 30 MG tablet Take 30 mg by mouth 3 (three) times daily.    . Brexpiprazole (REXULTI) 1 MG TABS Take 1 mg by mouth at bedtime.    . divalproex (DEPAKOTE ER) 500 MG 24 hr tablet Take 1 tablet (500 mg total) by mouth at bedtime. 90 tablet 1  . furosemide (LASIX) 40 MG tablet Take 1 tablet (40 mg total) by mouth daily. 30 tablet 5  . glucose blood (FREESTYLE PRECISION NEO TEST) test strip Used to check blood sugars twice daily. 100 each 12  . insulin aspart (NOVOLOG  FLEXPEN) 100 UNIT/ML FlexPen 10 units with breakfast, and 20 units with evening meal (Patient taking differently: Inject 10-20 Units into the skin See admin instructions. 10 units with breakfast, and 20 units with evening meal) 30 mL 11  . Insulin Pen Needle (B-D ULTRAFINE III SHORT PEN) 31G X 8 MM MISC AS DIRECTED TO INJECT LANTUS AND NOVOLOG DAILY 100 each 0  . levothyroxine (SYNTHROID, LEVOTHROID) 50 MCG tablet Take 1 tablet (50 mcg total) by mouth at bedtime. 30 tablet 11  . mupirocin ointment (BACTROBAN) 2 % APPLY EXTERNALLY TO THE AFFECTED AREA TWICE DAILY 22 g 0  . ondansetron (ZOFRAN) 8 MG tablet Take 8 mg by mouth every 8 (eight) hours as needed for nausea or vomiting.    . pantoprazole (PROTONIX) 40 MG tablet TAKE 1 TABLET(40 MG) BY MOUTH DAILY AT 6 AM 90 tablet 0  . polyethylene glycol (MIRALAX / GLYCOLAX) packet Take 17 g by mouth 2 (two) times daily. 30 each 0  . pregabalin (LYRICA) 150 MG capsule Take 1 capsule (150 mg total) by mouth 3 (three) times daily. 270 capsule 1  . protriptyline (VIVACTIL) 10 MG tablet Take 10 mg by mouth 3 (three) times daily.   11  . rivaroxaban (XARELTO) 20 MG TABS tablet TAKE 1 TABLET(20 MG) BY MOUTH DAILY 90 tablet 1  . tiZANidine (ZANAFLEX) 4 MG tablet Take 1 tablet (4 mg total) by mouth every 6 (six) hours as needed for muscle spasms. Do not refill in less than 30 days 20 tablet 6  . TRIUMEQ 600-50-300 MG tablet TAKE 1 TABLET BY MOUTH DAILY 30 tablet 4  . UNABLE TO FIND CPAP MACHINE with standard Aclaim nasal mask with humidifier. Set at 14 cwp (Patient taking differently: CPAP MACHINE with standard Aclaim nasal mask with humidifier. Set at 4 cwp) 1 each 0  . Vortioxetine HBr (TRINTELLIX PO) Take 25 mg by mouth at bedtime.     [name] zolpidem (AMBIEN) 10 MG tablet Take 10 mg by mouth at bedtime.      No current facility-administered medications on file prior to visit.     Allergies  Allergen Reactions  . Aspirin Swelling  . Ibuprofen Swelling  .  Sustiva [Efavirenz] Swelling and Rash  . Nsaids Other (See Comments)    unknwn    Family History  Problem Relation Age of Onset  . Depression Brother   . Throat cancer Brother        half brother, never smoker  . COPD Mother   .  Diabetes Neg Hx     BP 104/62 (BP Location: Right Arm, Patient Position: Sitting, Cuff Size: Large)   Pulse (!) 105   Ht 5' 11"  (1.803 m)   Wt 262 lb 3.2 oz (118.9 kg)   SpO2 92%   BMI 36.57 kg/m    Review of Systems He denies hypoglycemia.      Objective:   Physical Exam VITAL SIGNS:  See vs page.  GENERAL: no distress. Pulses: left foot pulses are intact.    MSK: no deformity of the feet or ankles, except for right transmetatarsal amputation.   CV: 2+ bilat edema of the legs  Skin:  normal color and temp on the feet and ankles, but the right foot has several bandaids (sees ortho).   Neuro: sensation is intact to touch on the feet and ankles, but decreased from normal.  There is onychomycosis of the left foot toenails.    Lab Results  Component Value Date   HGBA1C 9.8 (A) 03/31/2018   Lab Results  Component Value Date   CREATININE 1.41 (H) 03/24/2018   BUN 26 03/24/2018   NA 139 03/24/2018   K 4.5 03/24/2018   CL 100 03/24/2018   CO2 22 03/24/2018        Assessment & Plan:  Insulin-requiring type 2 DM, with PAD: worse Renal insuff: he would be better off with more novolog, but he did not do well with this.    Patient Instructions  check your blood sugar twice a day.  vary the time of day when you check, between before the 3 meals, and at bedtime.  also check if you have symptoms of your blood sugar being too high or too low.  please keep a record of the readings and bring it to your next appointment here (or you can bring the meter itself).  You can write it on any piece of paper.  please call us sooner if your blood sugar goes below 70, or if you have a lot of readings over 200.   I have sent a prescription to your pharmacy, to  increase the lantus, and: Please continue the same novolog. Please call or message [name] next week, to tell us how the blood sugar is doing.   Please come back for a follow-up appointment in 2 months.

## 2018-04-01 ENCOUNTER — Encounter: Payer: Self-pay | Admitting: Internal Medicine

## 2018-04-01 ENCOUNTER — Ambulatory Visit (HOSPITAL_COMMUNITY)
Admission: RE | Admit: 2018-04-01 | Discharge: 2018-04-01 | Disposition: A | Discharge status: Home / Self Care | Payer: PPO | Source: Ambulatory Visit | Attending: Internal Medicine | Admitting: Internal Medicine

## 2018-04-01 ENCOUNTER — Telehealth (HOSPITAL_COMMUNITY): Payer: Self-pay

## 2018-04-01 ENCOUNTER — Encounter: Payer: Self-pay | Admitting: *Deleted

## 2018-04-01 DIAGNOSIS — R0789 Other chest pain: Secondary | ICD-10-CM

## 2018-04-01 LAB — LIPID PANEL
Chol/HDL Ratio: 7.2 ratio — ABNORMAL HIGH (ref 0.0–5.0)
Cholesterol, Total: 258 mg/dL — ABNORMAL HIGH (ref 100–199)
HDL: 36 mg/dL — ABNORMAL LOW (ref >39)
LDL Calculated: 154 mg/dL — ABNORMAL HIGH (ref 0–99)
Triglycerides: 341 mg/dL — ABNORMAL HIGH (ref 0–149)
VLDL Cholesterol Cal: 68 mg/dL — ABNORMAL HIGH (ref 5–40)

## 2018-04-01 LAB — COMPREHENSIVE METABOLIC PANEL
ALT: 54 IU/L — ABNORMAL HIGH (ref 0–44)
AST: 38 IU/L (ref 0–40)
Albumin/Globulin Ratio: 1.7 (ref 1.2–2.2)
Albumin: 4.4 g/dL (ref 3.8–4.8)
Alkaline Phosphatase: 138 IU/L — ABNORMAL HIGH (ref 39–117)
BUN/Creatinine Ratio: 12 (ref 10–24)
BUN: 20 mg/dL (ref 8–27)
Bilirubin Total: 0.8 mg/dL (ref 0.0–1.2)
CALCIUM: 9.8 mg/dL (ref 8.6–10.2)
CO2: 25 mmol/L (ref 20–29)
Chloride: 96 mmol/L (ref 96–106)
Creatinine, Ser: 1.61 mg/dL — ABNORMAL HIGH (ref 0.76–1.27)
GFR calc Af Amer: 51 mL/min/{1.73_m2} — ABNORMAL LOW (ref >59)
GFR calc non Af Amer: 45 mL/min/{1.73_m2} — ABNORMAL LOW (ref >59)
Globulin, Total: 2.6 g/dL (ref 1.5–4.5)
Glucose: 209 mg/dL — ABNORMAL HIGH (ref 65–99)
Potassium: 4.3 mmol/L (ref 3.5–5.2)
Sodium: 140 mmol/L (ref 134–144)
Total Protein: 7 g/dL (ref 6.0–8.5)

## 2018-04-01 NOTE — Telephone Encounter (Signed)
Encounter complete. 

## 2018-04-02 ENCOUNTER — Other Ambulatory Visit: Payer: Self-pay | Admitting: Family Medicine

## 2018-04-02 ENCOUNTER — Ambulatory Visit (HOSPITAL_COMMUNITY)
Admission: RE | Admit: 2018-04-02 | Discharge: 2018-04-02 | Disposition: A | Discharge status: Home / Self Care | Payer: PPO | Source: Ambulatory Visit | Attending: Cardiovascular Disease | Admitting: Cardiovascular Disease

## 2018-04-02 DIAGNOSIS — R0789 Other chest pain: Secondary | ICD-10-CM | POA: Insufficient documentation

## 2018-04-02 LAB — MYOCARDIAL PERFUSION IMAGING
LV dias vol: 80 mL (ref 62–150)
LV sys vol: 33 mL
Peak HR: 91 {beats}/min
Rest HR: 82 {beats}/min
SDS: 1
SRS: 0
SSS: 1
TID: 1.25

## 2018-04-02 MED ORDER — REGADENOSON 0.4 MG/5ML IV SOLN
0.4000 mg | Freq: Once | INTRAVENOUS | Status: AC
  Administered 2018-04-02: 0.4 mg via INTRAVENOUS

## 2018-04-02 MED ORDER — TECHNETIUM TC 99M TETROFOSMIN IV KIT
30.9000 | PACK | Freq: Once | INTRAVENOUS | Status: AC | PRN
  Administered 2018-04-02: 30.9 via INTRAVENOUS
  Filled 2018-04-02: qty 31

## 2018-04-02 MED ORDER — TECHNETIUM TC 99M TETROFOSMIN IV KIT
10.9000 | PACK | Freq: Once | INTRAVENOUS | Status: AC | PRN
  Administered 2018-04-02: 10.9 via INTRAVENOUS
  Filled 2018-04-02: qty 11

## 2018-04-02 NOTE — Telephone Encounter (Signed)
Requested Prescriptions  Pending Prescriptions Disp Refills  . diclofenac sodium (VOLTAREN) 1 % GEL [Pharmacy Med Name: DICLOFENAC 1% GEL 100GM] 300 g 0    Sig: APPLY 2 GRAMS TO EACH KNEE IN THE MORNING AND AT BEDTIME AND APPLY 1 GRAM TO EACH KNEE IN THE AFTERNOON     Analgesics:  Topicals Passed - 04/02/2018  9:40 AM      Passed - Valid encounter within last 12 months    Recent Outpatient Visits          1 week ago Chronic anticoagulation   [facility], Ranell Patrick, MD   4 months ago History of DVT (deep vein thrombosis)   [facility], Ranell Patrick, MD   7 months ago Weakness on right side of face   [facility], Ranell Patrick, MD   8 months ago Folliculitis   [facility], Ranell Patrick, MD   11 months ago Pain in both knees, unspecified chronicity   [facility], Ranell Patrick, MD      Future Appointments            In 2 months Ray[place], Neta Mends, PA-C [place]   In 5 months Carlota Raspberry, Ranell Patrick, MD [facility] [place], [facility]

## 2018-04-08 ENCOUNTER — Encounter: Payer: Self-pay | Admitting: Endocrinology

## 2018-04-08 NOTE — Telephone Encounter (Signed)
See attached readings

## 2018-04-09 ENCOUNTER — Telehealth: Payer: Self-pay | Admitting: Endocrinology

## 2018-04-09 ENCOUNTER — Telehealth (INDEPENDENT_AMBULATORY_CARE_PROVIDER_SITE_OTHER): Payer: Self-pay | Admitting: Orthopedic Surgery

## 2018-04-09 NOTE — Telephone Encounter (Signed)
Shameka from [place] called regarding fax sent to their office. They need notes from foot exam from 03/31/2018 appointment. Please fax to [phone]

## 2018-04-09 NOTE — Telephone Encounter (Signed)
Do you want [name] to fill Rx if so, what instructions and quantity?

## 2018-04-09 NOTE — Telephone Encounter (Signed)
[place] [phone]    Daniel [name] called wanted to check and see if we received the request for Doxycyline 100 mg tablets

## 2018-04-09 NOTE — Telephone Encounter (Signed)
Office notes faxed

## 2018-04-09 NOTE — Telephone Encounter (Signed)
This is a Xu pt.

## 2018-04-09 NOTE — Telephone Encounter (Signed)
I don't think he needs any po abx at this time unless something has changed.

## 2018-04-12 ENCOUNTER — Telehealth: Payer: Self-pay | Admitting: Cardiovascular Disease

## 2018-04-12 ENCOUNTER — Telehealth (INDEPENDENT_AMBULATORY_CARE_PROVIDER_SITE_OTHER): Payer: Self-pay | Admitting: Orthopedic Surgery

## 2018-04-12 DIAGNOSIS — E785 Hyperlipidemia, unspecified: Secondary | ICD-10-CM

## 2018-04-12 DIAGNOSIS — Z5181 Encounter for therapeutic drug level monitoring: Secondary | ICD-10-CM

## 2018-04-12 MED ORDER — EZETIMIBE 10 MG PO TABS: 10.0000 mg | ORAL_TABLET | Freq: Every day | ORAL | 3 refills | Status: DC

## 2018-04-12 NOTE — Telephone Encounter (Signed)
Patient is calling about phone call last week and cholesterol medication Dr. [name] wanted him to take.

## 2018-04-12 NOTE — Telephone Encounter (Signed)
Tried calling patient to advise no answer LMOM.   SEE MESSAGE BELOW.

## 2018-04-12 NOTE — Telephone Encounter (Signed)
See message.

## 2018-04-12 NOTE — Telephone Encounter (Signed)
Advised patient of myoview and lab recommendation Will mail lab orders for repeat labs. Rx sent to pharmacy

## 2018-04-12 NOTE — Telephone Encounter (Signed)
-----   Message from Skeet Latch, MD sent at 04/10/2018  4:44 PM EST ----- Zetia 69m daily. ----- Message ----- From: BWaylan Rocher LPN Sent: 28/71/8410  3:28 PM EST To: TSkeet Latch MD  Pt informed of providers result & recommendations. Pt verbalized understanding. He states that he will not take stating he gets incredible pain in his legs the couple times that he has tried them. Can you rx something else?

## 2018-04-12 NOTE — Telephone Encounter (Signed)
Patient returned call asked for a call. Patient said he was calling about the ointment (Mupirotin) The number to contact patient is [phone]

## 2018-04-12 NOTE — Telephone Encounter (Signed)
-----   Message from Skeet Latch, MD sent at 04/10/2018  4:35 PM EST ----- Low risk stress test

## 2018-04-12 NOTE — Telephone Encounter (Signed)
Tried to call patient again no answer. LMOM to return our call.

## 2018-04-12 NOTE — Telephone Encounter (Signed)
Spoke to patient and advised. He will call us back if anything changes.

## 2018-04-13 ENCOUNTER — Other Ambulatory Visit (INDEPENDENT_AMBULATORY_CARE_PROVIDER_SITE_OTHER): Payer: Self-pay

## 2018-04-13 MED ORDER — MUPIROCIN 2 % EX OINT: TOPICAL_OINTMENT | CUTANEOUS | 0 refills | Status: DC

## 2018-04-13 NOTE — Telephone Encounter (Signed)
Sure refill

## 2018-04-13 NOTE — Telephone Encounter (Signed)
RX SENT INTO PHARM

## 2018-04-16 ENCOUNTER — Encounter: Payer: Self-pay | Admitting: Endocrinology

## 2018-04-20 ENCOUNTER — Other Ambulatory Visit: Payer: Self-pay | Admitting: Family Medicine

## 2018-04-20 ENCOUNTER — Other Ambulatory Visit: Payer: Self-pay | Admitting: Endocrinology

## 2018-04-20 DIAGNOSIS — E1142 Type 2 diabetes mellitus with diabetic polyneuropathy: Secondary | ICD-10-CM

## 2018-04-20 NOTE — Telephone Encounter (Signed)
Requested medication (s) are due for refill today -yes  Requested medication (s) are on the active medication list -yes  Future visit scheduled -no  Last refill: 03/24/18  Notes to clinic: Patient is requesting refill of non delegated Rx- sent for PCP review  Requested Prescriptions  Pending Prescriptions Disp Refills   LYRICA 150 MG capsule [Pharmacy Med Name: LYRICA 150MG CAPSULES] 90 capsule     Sig: TAKE 1 CAPSULE(150 MG) BY MOUTH THREE TIMES DAILY     Not Delegated - Neurology:  Anticonvulsants - Controlled Failed - 04/20/2018  4:20 PM      Failed - This refill cannot be delegated      Passed - Valid encounter within last 12 months    Recent Outpatient Visits          3 weeks ago Chronic anticoagulation   [facility], Ranell Patrick, MD   5 months ago History of DVT (deep vein thrombosis)   [facility], Ranell Patrick, MD   7 months ago Weakness on right side of face   [facility], Ranell Patrick, MD   9 months ago Folliculitis   [facility], Ranell Patrick, MD   12 months ago Pain in both knees, unspecified chronicity   [facility], Ranell Patrick, MD      Future Appointments            In 1 month Suzan Slick, NP [facility] [facility]   In 5 months [name], MD [facility] at [place], [facility]         Signed Prescriptions Disp Refills   diclofenac sodium (VOLTAREN) 1 % GEL 300 g 0    Sig: APPLY 2 GRAMS TOPICALLY TO EACH KNEE IN THE MORNING AND AT BEDTIME; APPLY 1 GRAM TO EACH KNEE IN THE AFTERNOON     Analgesics:  Topicals Passed - 04/20/2018  4:20 PM      Passed - Valid encounter within last 12 months    Recent Outpatient Visits          3 weeks ago Chronic anticoagulation   [facility], Ranell Patrick, MD   5 months ago History of DVT (deep vein thrombosis)   [facility], Ranell Patrick, MD   7 months ago Weakness on right side of face   [facility]  at [facility], Ranell Patrick, MD   9 months ago Folliculitis   [facility], Ranell Patrick, MD   12 months ago Pain in both knees, unspecified chronicity   [facility], Ranell Patrick, MD      Future Appointments            In 1 month Suzan Slick, NP [facility] [facility]   In 5 months Carlota Raspberry, Ranell Patrick, MD [facility] at [place], [facility]            Requested Prescriptions  Pending Prescriptions Disp Refills   LYRICA 150 MG capsule [Pharmacy Med Name: LYRICA 150MG CAPSULES] 90 capsule     Sig: TAKE 1 CAPSULE(150 MG) BY MOUTH THREE TIMES DAILY     Not Delegated - Neurology:  Anticonvulsants - Controlled Failed - 04/20/2018  4:20 PM      Failed - This refill cannot be delegated      Passed - Valid encounter within last 12 months    Recent Outpatient Visits  3 weeks ago Chronic anticoagulation   [facility], Ranell Patrick, MD   5 months ago History of DVT (deep vein thrombosis)   [facility], Ranell Patrick, MD   7 months ago Weakness on right side of face   [facility], Ranell Patrick, MD   9 months ago Folliculitis   [facility], Ranell Patrick, MD   12 months ago Pain in both knees, unspecified chronicity   [facility], Ranell Patrick, MD      Future Appointments            In 1 month Suzan Slick, NP [employer]   In 5 months [name], MD Primary Care at [place], [facility]         Signed Prescriptions Disp Refills   diclofenac sodium (VOLTAREN) 1 % GEL 300 g 0    Sig: APPLY 2 GRAMS TOPICALLY TO EACH KNEE IN THE MORNING AND AT BEDTIME; APPLY 1 GRAM TO EACH KNEE IN THE AFTERNOON     Analgesics:  Topicals Passed - 04/20/2018  4:20 PM      Passed - Valid encounter within last 12 months    Recent Outpatient Visits          3 weeks ago Chronic anticoagulation   [facility], Ranell Patrick, MD   5 months ago History of DVT  (deep vein thrombosis)   [facility], Ranell Patrick, MD   7 months ago Weakness on right side of face   [facility], Ranell Patrick, MD   9 months ago Folliculitis   [facility], Ranell Patrick, MD   12 months ago Pain in both knees, unspecified chronicity   [facility], Ranell Patrick, MD      Future Appointments            In 1 month Suzan Slick, NP [employer]   In 5 months Carlota Raspberry, Ranell Patrick, MD [facility] at [place], [facility]

## 2018-04-25 ENCOUNTER — Encounter: Payer: Self-pay | Admitting: Endocrinology

## 2018-04-26 ENCOUNTER — Other Ambulatory Visit: Payer: PPO

## 2018-04-26 DIAGNOSIS — E785 Hyperlipidemia, unspecified: Secondary | ICD-10-CM | POA: Diagnosis not present

## 2018-04-26 DIAGNOSIS — Z5181 Encounter for therapeutic drug level monitoring: Secondary | ICD-10-CM | POA: Diagnosis not present

## 2018-04-26 DIAGNOSIS — B20 Human immunodeficiency virus [HIV] disease: Secondary | ICD-10-CM | POA: Diagnosis not present

## 2018-04-26 NOTE — Telephone Encounter (Signed)
Please review and advise.

## 2018-04-27 LAB — COMPREHENSIVE METABOLIC PANEL
ALK PHOS: 122 IU/L — AB (ref 39–117)
ALT: 64 IU/L — ABNORMAL HIGH (ref 0–44)
AST: 52 IU/L — ABNORMAL HIGH (ref 0–40)
Albumin/Globulin Ratio: 1.6 (ref 1.2–2.2)
Albumin: 4.4 g/dL (ref 3.8–4.8)
BILIRUBIN TOTAL: 0.6 mg/dL (ref 0.0–1.2)
BUN/Creatinine Ratio: 12 (ref 10–24)
BUN: 19 mg/dL (ref 8–27)
CO2: 24 mmol/L (ref 20–29)
Calcium: 9.8 mg/dL (ref 8.6–10.2)
Chloride: 100 mmol/L (ref 96–106)
Creatinine, Ser: 1.65 mg/dL — ABNORMAL HIGH (ref 0.76–1.27)
GFR calc Af Amer: 50 mL/min/{1.73_m2} — ABNORMAL LOW (ref >59)
GFR calc non Af Amer: 43 mL/min/{1.73_m2} — ABNORMAL LOW (ref >59)
Globulin, Total: 2.8 g/dL (ref 1.5–4.5)
Glucose: 79 mg/dL (ref 65–99)
Potassium: 4.6 mmol/L (ref 3.5–5.2)
Sodium: 143 mmol/L (ref 134–144)
Total Protein: 7.2 g/dL (ref 6.0–8.5)

## 2018-04-27 LAB — LIPID PANEL
CHOLESTEROL TOTAL: 201 mg/dL — AB (ref 100–199)
Chol/HDL Ratio: 5.2 ratio — ABNORMAL HIGH (ref 0.0–5.0)
HDL: 39 mg/dL — ABNORMAL LOW (ref >39)
LDL Calculated: 113 mg/dL — ABNORMAL HIGH (ref 0–99)
Triglycerides: 247 mg/dL — ABNORMAL HIGH (ref 0–149)
VLDL Cholesterol Cal: 49 mg/dL — ABNORMAL HIGH (ref 5–40)

## 2018-04-27 LAB — T-HELPER CELL (CD4) - (RCID CLINIC ONLY)
CD4 % Helper T Cell: 34 % (ref 33–55)
CD4 T Cell Abs: 720 /uL (ref 400–2700)

## 2018-04-28 LAB — HIV-1 RNA QUANT-NO REFLEX-BLD
HIV 1 RNA Quant: <20 copies/mL
HIV-1 RNA Quant, Log: <1.3 Log copies/mL

## 2018-05-04 ENCOUNTER — Encounter: Payer: Self-pay | Admitting: Endocrinology

## 2018-05-04 NOTE — Telephone Encounter (Signed)
Please review

## 2018-05-05 LAB — HM COLONOSCOPY

## 2018-05-07 DIAGNOSIS — G4733 Obstructive sleep apnea (adult) (pediatric): Secondary | ICD-10-CM | POA: Diagnosis not present

## 2018-05-10 ENCOUNTER — Other Ambulatory Visit: Payer: Self-pay

## 2018-05-10 ENCOUNTER — Ambulatory Visit (INDEPENDENT_AMBULATORY_CARE_PROVIDER_SITE_OTHER): Payer: PPO | Admitting: Internal Medicine

## 2018-05-10 ENCOUNTER — Encounter: Payer: Self-pay | Admitting: Endocrinology

## 2018-05-10 ENCOUNTER — Encounter: Payer: Self-pay | Admitting: Internal Medicine

## 2018-05-10 VITALS — BP 136/85 | HR 109 | Temp 98.0°F | Wt 263.0 lb

## 2018-05-10 DIAGNOSIS — N183 Chronic kidney disease, stage 3 unspecified: Secondary | ICD-10-CM

## 2018-05-10 DIAGNOSIS — E291 Testicular hypofunction: Secondary | ICD-10-CM | POA: Diagnosis not present

## 2018-05-10 DIAGNOSIS — K760 Fatty (change of) liver, not elsewhere classified: Secondary | ICD-10-CM

## 2018-05-10 DIAGNOSIS — B20 Human immunodeficiency virus [HIV] disease: Secondary | ICD-10-CM | POA: Diagnosis not present

## 2018-05-10 NOTE — Assessment & Plan Note (Signed)
Mild increase in transaminitis.  Discussed diet modifications.

## 2018-05-10 NOTE — Assessment & Plan Note (Addendum)
Will send to urology, ? Low testosterone.  ? Other issues.

## 2018-05-10 NOTE — Assessment & Plan Note (Signed)
Doing well, no issues.  rtc 6 months.  

## 2018-05-10 NOTE — Progress Notes (Signed)
   Subjective:    Patient ID: Daniel [name], male    DOB: [birth year], 63 y.o.   MRN: [id number]  HPI He is here for follow-up of HIV  He continues on Triumeq and denies any missed doses.  CD4 of 720, viral load < 20. Some mild increased transaminitis.  Foot doing well after transmetatarsal amputation last year.  Sugars improving though still drinking some sugary drinks, though much less overall.   He also reports (after the visit) that he has noted a decreased penile and testicular volume.  No discharge, no masses reported, no erythema.     Review of Systems  Constitutional: Negative for chills, fatigue and fever.  Gastrointestinal: Negative for diarrhea.  Skin: Negative for rash.  Neurological: Negative for dizziness.       Objective:   Physical Exam Constitutional:      General: He is not in acute distress.    Appearance: He is well-developed.  Eyes:     General: No scleral icterus. Cardiovascular:     Rate and Rhythm: Normal rate and regular rhythm.     Heart sounds: Normal heart sounds. No murmur.  Pulmonary:     Effort: Pulmonary effort is normal. No respiratory distress.     Breath sounds: Normal breath sounds.  Skin:    Findings: No rash.    SH: former smoker No sexual activity       Assessment & Plan:

## 2018-05-10 NOTE — Assessment & Plan Note (Signed)
Stable creat

## 2018-05-11 ENCOUNTER — Other Ambulatory Visit: Payer: Self-pay

## 2018-05-11 ENCOUNTER — Telehealth: Payer: Self-pay | Admitting: Endocrinology

## 2018-05-11 MED ORDER — INSULIN GLARGINE 100 UNIT/ML SOLOSTAR PEN: 210.0000 [IU] | PEN_INJECTOR | SUBCUTANEOUS | 99 refills | Status: DC

## 2018-05-11 NOTE — Telephone Encounter (Signed)
Additional message for review

## 2018-05-11 NOTE — Telephone Encounter (Signed)
Please review

## 2018-05-11 NOTE — Telephone Encounter (Signed)
Update made at pt request

## 2018-05-11 NOTE — Telephone Encounter (Signed)
Patient called to confirm that his Insulin Glargine (LANTUS SOLOSTAR) 100 UNIT/ML Solostar Pen had been updated in his chart to 210 units for future refills. Please Advise, Thanks

## 2018-05-13 DIAGNOSIS — Z89421 Acquired absence of other right toe(s): Secondary | ICD-10-CM | POA: Diagnosis not present

## 2018-05-13 DIAGNOSIS — E119 Type 2 diabetes mellitus without complications: Secondary | ICD-10-CM | POA: Diagnosis not present

## 2018-05-18 ENCOUNTER — Encounter: Payer: Self-pay | Admitting: Endocrinology

## 2018-05-19 ENCOUNTER — Telehealth (INDEPENDENT_AMBULATORY_CARE_PROVIDER_SITE_OTHER): Payer: Self-pay

## 2018-05-19 NOTE — Telephone Encounter (Signed)
Called LMVM for patient that we needed to r/s appt he has scheduled for 04/15 with Junie Panning

## 2018-05-24 ENCOUNTER — Other Ambulatory Visit: Payer: Self-pay | Admitting: Endocrinology

## 2018-05-25 ENCOUNTER — Encounter: Payer: Self-pay | Admitting: Endocrinology

## 2018-05-28 ENCOUNTER — Other Ambulatory Visit: Payer: Self-pay | Admitting: Internal Medicine

## 2018-05-28 ENCOUNTER — Other Ambulatory Visit: Payer: Self-pay | Admitting: Family Medicine

## 2018-05-28 DIAGNOSIS — B20 Human immunodeficiency virus [HIV] disease: Secondary | ICD-10-CM

## 2018-05-28 NOTE — Telephone Encounter (Signed)
10 minutes spent reviewing prior notes and pt messages, and continuous glucose monitor records

## 2018-05-28 NOTE — Telephone Encounter (Signed)
Okay to refill? 

## 2018-05-29 NOTE — Telephone Encounter (Signed)
voltaren refilled - should discuss use and efficacy next visit.

## 2018-06-01 ENCOUNTER — Encounter (INDEPENDENT_AMBULATORY_CARE_PROVIDER_SITE_OTHER): Payer: Self-pay

## 2018-06-03 ENCOUNTER — Telehealth (INDEPENDENT_AMBULATORY_CARE_PROVIDER_SITE_OTHER): Payer: Self-pay | Admitting: Orthopaedic Surgery

## 2018-06-03 NOTE — Telephone Encounter (Signed)
[name] called to f/u on a refill request that was faxed over from them to our office.  CB#[phone].  Thank you.

## 2018-06-08 ENCOUNTER — Telehealth (INDEPENDENT_AMBULATORY_CARE_PROVIDER_SITE_OTHER): Payer: Self-pay

## 2018-06-08 ENCOUNTER — Telehealth (INDEPENDENT_AMBULATORY_CARE_PROVIDER_SITE_OTHER): Payer: Self-pay | Admitting: Orthopaedic Surgery

## 2018-06-08 NOTE — Telephone Encounter (Signed)
[facility] called to f/u on the refill request that was sent over in regards to the patient's Mupirocin.  CB#[phone].  Thank you.

## 2018-06-08 NOTE — Telephone Encounter (Signed)
LM with patient friend to inform patient that we need to speak to him about his appt, he understood and stated he will deliver the message.

## 2018-06-08 NOTE — Telephone Encounter (Signed)
SEE OTHER MESSAGE

## 2018-06-09 ENCOUNTER — Ambulatory Visit (INDEPENDENT_AMBULATORY_CARE_PROVIDER_SITE_OTHER): Payer: PPO | Admitting: Endocrinology

## 2018-06-09 ENCOUNTER — Other Ambulatory Visit (INDEPENDENT_AMBULATORY_CARE_PROVIDER_SITE_OTHER): Payer: Self-pay

## 2018-06-09 ENCOUNTER — Ambulatory Visit (INDEPENDENT_AMBULATORY_CARE_PROVIDER_SITE_OTHER): Payer: PPO | Admitting: Family

## 2018-06-09 ENCOUNTER — Encounter: Payer: Self-pay | Admitting: Endocrinology

## 2018-06-09 ENCOUNTER — Telehealth (INDEPENDENT_AMBULATORY_CARE_PROVIDER_SITE_OTHER): Payer: Self-pay

## 2018-06-09 ENCOUNTER — Other Ambulatory Visit: Payer: Self-pay

## 2018-06-09 DIAGNOSIS — Z794 Long term (current) use of insulin: Secondary | ICD-10-CM | POA: Diagnosis not present

## 2018-06-09 DIAGNOSIS — E1151 Type 2 diabetes mellitus with diabetic peripheral angiopathy without gangrene: Secondary | ICD-10-CM

## 2018-06-09 DIAGNOSIS — Z9114 Patient's other noncompliance with medication regimen: Secondary | ICD-10-CM | POA: Diagnosis not present

## 2018-06-09 DIAGNOSIS — E1129 Type 2 diabetes mellitus with other diabetic kidney complication: Secondary | ICD-10-CM | POA: Diagnosis not present

## 2018-06-09 DIAGNOSIS — E119 Type 2 diabetes mellitus without complications: Secondary | ICD-10-CM

## 2018-06-09 MED ORDER — INSULIN GLARGINE 100 UNIT/ML SOLOSTAR PEN: 250.0000 [IU] | PEN_INJECTOR | SUBCUTANEOUS | 99 refills | Status: DC

## 2018-06-09 NOTE — Patient Instructions (Addendum)
check your blood sugar twice a day.  vary the time of day when you check, between before the 3 meals, and at bedtime.  also check if you have symptoms of your blood sugar being too high or too low.  please keep a record of the readings and bring it to your next appointment here (or you can bring the meter itself).  You can write it on any piece of paper.  please call us sooner if your blood sugar goes below 70, or if you have a lot of readings over 200.   I have sent a prescription to your pharmacy, to increase the lantus, and: Please continue the same novolog.   Please come back for a follow-up appointment in 6 weeks.

## 2018-06-09 NOTE — Telephone Encounter (Signed)
See message below. Okay to Rf ointment?

## 2018-06-09 NOTE — Telephone Encounter (Signed)
Please advise 

## 2018-06-09 NOTE — Telephone Encounter (Signed)
Thank you :)

## 2018-06-09 NOTE — Telephone Encounter (Signed)
Patient was called again and LVM to call back to get rescheduled.

## 2018-06-09 NOTE — Telephone Encounter (Signed)
yes

## 2018-06-09 NOTE — Progress Notes (Addendum)
Subjective:    Patient ID: Daniel [name], male    DOB: Mar 26, 1955, 63 y.o.   MRN: [id number]  HPI  telehealth visit today via doxy video visit.  Alternatives to telehealth are presented to this patient, and the patient agrees to the telehealth visit. Pt is advised of the cost of the visit, and agrees to this, also.   Patient is at home, and I am at the office.   Pt returns for f/u of diabetes mellitus: DM type: Insulin-requiring type 2.  Dx'ed: 1700 Complications: polyneuropathy, PAD, renal insufficiency, and foot ulcers.  Therapy: insulin since 2016. DKA: never Severe hypoglycemia: never.  Pancreatitis: never.   Other: he takes multiple daily injections, but basal insulin is emphasized, with improved results.  Interval history: I reviewed continuous glucose monitor data.  Glucose varies from 126-340.  It is in general higher as the day goes on, but not necessarily so.  Pt says he seldom misses the insulin. He says his diet is poor.   Past Medical History:  Diagnosis Date  . ADHD (attention deficit hyperactivity disorder)   . Anxiety   . Chronic kidney disease   . Clotting disorder ([place])   . Depression   . Diabetes mellitus without complication ([place])   . Diabetes mellitus, type II ([place])   . Dizziness 03/17/2015  . GERD (gastroesophageal reflux disease)   . HIV disease ([place])   . HIV infection ([place])   . Liver disease   . OSA (obstructive sleep apnea) 07/25/2015   Uses CPAP regularly  . Peripheral vascular disease ([place])   . Ulcer     Past Surgical History:  Procedure Laterality Date  . AMPUTATION Right 10/02/2017   Procedure: RIGHT TRANSMETATARSAL AMPUTATION;  Surgeon: Leandrew Koyanagi, MD;  Location: [facility];  Service: Orthopedics;  Laterality: Right;  . SMALL INTESTINE SURGERY    . STOMACH SURGERY    . TOE AMPUTATION Right 08/2016   right great toe    Social History   Socioeconomic History  . Marital status: Single    Spouse name: Not on file  . Number of children: Not  on file  . Years of education: Not on file  . Highest education level: Not on file  Occupational History    Comment: DISABILITY  Social Needs  . Financial resource strain: Not hard at all  . Food insecurity:    Worry: Never true    Inability: Never true  . Transportation needs:    Medical: No    Non-medical: No  Tobacco Use  . Smoking status: Former Smoker    Packs/day: 0.10    Years: 10.00    Pack years: 1.00    Types: Cigars    Last attempt to quit: 08/09/2014    Years since quitting: 3.8  . Smokeless tobacco: Never Used  Substance and Sexual Activity  . Alcohol use: No    Alcohol/week: 0.0 standard drinks  . Drug use: No  . Sexual activity: Not Currently    Partners: Male    Comment: pt. declined condoms  Lifestyle  . Physical activity:    Days per week: 0 days    Minutes per session: 0 min  . Stress: Only a little  Relationships  . Social connections:    Talks on phone: More than three times a week    Gets together: Once a week    Attends religious service: Never    Active member of club or organization: No    Attends meetings  of clubs or organizations: Not on file    Relationship status: Never married  . Intimate partner violence:    Fear of current or ex partner: No    Emotionally abused: No    Physically abused: No    Forced sexual activity: No  Other Topics Concern  . Not on file  Social History Narrative   Epworth Sleepiness Scale = 7 (as of 03/16/2015)    Current Outpatient Medications on File Prior to Visit  Medication Sig Dispense Refill  . acetaminophen (TYLENOL) 500 MG tablet Take 1,000-1,500 mg by mouth every 8 (eight) hours as needed for moderate pain.     [name] ALPRAZolam (XANAX) 1 MG tablet Take 1 mg by mouth 4 (four) times daily as needed for anxiety.     [name] amphetamine-dextroamphetamine (ADDERALL) 30 MG tablet Take 30 mg by mouth 3 (three) times daily.    . Brexpiprazole (REXULTI) 1 MG TABS Take 1 mg by mouth at bedtime.    . Cholecalciferol  (VITAMIN D3) 250 MCG (10000 UT) TABS Take by mouth.    . Cyanocobalamin (VITAMIN B-12) 5000 MCG LOZG Take by mouth.    . diclofenac sodium (VOLTAREN) 1 % GEL APPLY 2 GRAMS TOPICALLY TO EACH KNEE IN THE MORNING AND AT BEDTIME, APPLY 1 GRAM TO EACH KNEE IN THE AFTERNOON 300 g 0  . divalproex (DEPAKOTE ER) 500 MG 24 hr tablet Take 1 tablet (500 mg total) by mouth at bedtime. 90 tablet 1  . ezetimibe (ZETIA) 10 MG tablet Take 1 tablet (10 mg total) by mouth daily. 90 tablet 3  . furosemide (LASIX) 40 MG tablet Take 1 tablet (40 mg total) by mouth daily. 30 tablet 5  . glucose blood (FREESTYLE PRECISION NEO TEST) test strip Used to check blood sugars twice daily. 100 each 12  . insulin aspart (NOVOLOG FLEXPEN) 100 UNIT/ML FlexPen 10 units with breakfast, and 20 units with evening meal (Patient taking differently: Inject 10-20 Units into the skin See admin instructions. 10 units with breakfast, and 20 units with evening meal) 30 mL 11  . Insulin Pen Needle (B-D ULTRAFINE III SHORT PEN) 31G X 8 MM MISC AS DIRECTED TO INJECT LANTUS AND NOVOLOG DAILY 100 each 0  . levothyroxine (SYNTHROID, LEVOTHROID) 50 MCG tablet Take 1 tablet (50 mcg total) by mouth at bedtime. 30 tablet 11  . ondansetron (ZOFRAN) 8 MG tablet Take 8 mg by mouth every 8 (eight) hours as needed for nausea or vomiting.    . pantoprazole (PROTONIX) 40 MG tablet TAKE 1 TABLET(40 MG) BY MOUTH DAILY AT 6 AM 90 tablet 0  . polyethylene glycol (MIRALAX / GLYCOLAX) packet Take 17 g by mouth 2 (two) times daily. 30 each 0  . pregabalin (LYRICA) 150 MG capsule Take 1 capsule (150 mg total) by mouth 3 (three) times daily. 270 capsule 1  . protriptyline (VIVACTIL) 10 MG tablet Take 10 mg by mouth 3 (three) times daily.   11  . rivaroxaban (XARELTO) 20 MG TABS tablet TAKE 1 TABLET(20 MG) BY MOUTH DAILY 90 tablet 1  . tiZANidine (ZANAFLEX) 4 MG tablet Take 1 tablet (4 mg total) by mouth every 6 (six) hours as needed for muscle spasms. Do not refill in  less than 30 days 20 tablet 6  . TRIUMEQ 600-50-300 MG tablet TAKE 1 TABLET BY MOUTH DAILY 30 tablet 4  . UNABLE TO FIND CPAP MACHINE with standard Aclaim nasal mask with humidifier. Set at 14 cwp (Patient taking differently: CPAP MACHINE with  standard Aclaim nasal mask with humidifier. Set at 4 cwp) 1 each 0  . Vortioxetine HBr (TRINTELLIX PO) Take 25 mg by mouth at bedtime.     [name] zolpidem (AMBIEN) 10 MG tablet Take 10 mg by mouth at bedtime.      No current facility-administered medications on file prior to visit.     Allergies  Allergen Reactions  . Aspirin Swelling  . Ibuprofen Swelling  . Sustiva [Efavirenz] Swelling and Rash  . Nsaids Other (See Comments)    unknwn    Family History  Problem Relation Age of Onset  . Depression Brother   . Throat cancer Brother        half brother, never smoker  . COPD Mother   . Diabetes Neg Hx      Review of Systems He denies hypoglycemia.      Objective:   Physical Exam    Lab Results  Component Value Date   CREATININE 1.65 (H) 04/26/2018   BUN 19 04/26/2018   NA 143 04/26/2018   K 4.6 04/26/2018   CL 100 04/26/2018   CO2 24 04/26/2018      Assessment & Plan:  Insulin-requiring type 2 DM, with PAD: he needs increased rx Renal insuff: in this setting, he needs mostly mealtime insulin.  However, he has done better with mostly basal insulin.   Noncompliance with insulin, mild: despite the fact that he seldom misses the insulin, he has done better with mostly basal insulin.   Patient Instructions  check your blood sugar twice a day.  vary the time of day when you check, between before the 3 meals, and at bedtime.  also check if you have symptoms of your blood sugar being too high or too low.  please keep a record of the readings and bring it to your next appointment here (or you can bring the meter itself).  You can write it on any piece of paper.  please call us sooner if your blood sugar goes below 70, or if you have a lot of  readings over 200.   I have sent a prescription to your pharmacy, to increase the lantus, and: Please continue the same novolog.   Please come back for a follow-up appointment in 6 weeks.

## 2018-06-09 NOTE — Telephone Encounter (Signed)
Patient called back to return your call, I advised him of your message and he stated that he will call back to r/s his appointment that was cancelled for tomorrow.  Thank you.

## 2018-06-10 ENCOUNTER — Other Ambulatory Visit (INDEPENDENT_AMBULATORY_CARE_PROVIDER_SITE_OTHER): Payer: Self-pay

## 2018-06-10 ENCOUNTER — Ambulatory Visit (INDEPENDENT_AMBULATORY_CARE_PROVIDER_SITE_OTHER): Payer: PPO | Admitting: Physician Assistant

## 2018-06-10 MED ORDER — MUPIROCIN 2 % EX OINT: 1.0000 "application " | TOPICAL_OINTMENT | Freq: Two times a day (BID) | CUTANEOUS | 3 refills | Status: DC

## 2018-06-10 NOTE — Telephone Encounter (Signed)
Sent in to pharm.

## 2018-06-15 ENCOUNTER — Telehealth: Payer: Self-pay

## 2018-06-15 NOTE — Telephone Encounter (Signed)
Virtual Visit Pre-Appointment Phone Call  Steps For Call: [place] PHONE   1. Confirm consent - "In the setting of the current Covid19 crisis, you are scheduled for a (phone or video) visit with your provider on (date) at (time).  Just as we do with many in-office visits, in order for you to participate in this visit, we must obtain consent.  If you'd like, I can send this to your mychart (if signed up) or email for you to review.  Otherwise, I can obtain your verbal consent now.  All virtual visits are billed to your insurance company just like a normal visit would be.  By agreeing to a virtual visit, we'd like you to understand that the technology does not allow for your provider to perform an examination, and thus may limit your provider's ability to fully assess your condition. If your provider identifies any concerns that need to be evaluated in person, we will make arrangements to do so.  Finally, though the technology is pretty good, we cannot assure that it will always work on either your or our end, and in the setting of a video visit, we may have to convert it to a phone-only visit.  In either situation, we cannot ensure that we have a secure connection.  Are you willing to proceed?" STAFF: Did the patient verbally acknowledge consent to telehealth visit? Document YES/NO here:   2. Confirm the BEST phone number to call the day of the visit by including in appointment notes  3. Give patient instructions for MyChart download to smartphone OR [email or web address] as below if video visit (depending on what platform provider is using)  4. Confirm that appointment type is correct in Epic appointment notes (VIDEO vs PHONE)  5. Advise patient to be prepared with their blood pressure, heart rate, weight, any heart rhythm information, their current medicines, and a piece of paper and pen handy for any instructions they may receive the day of their visit  6. Inform patient they will receive a  phone call 15 minutes prior to their appointment time (may be from unknown caller ID) so they should be prepared to answer    TELEPHONE CALL NOTE  Daniel [name] has been deemed a candidate for a follow-up tele-health visit to limit community exposure during the Covid-19 pandemic. I spoke with the patient via phone to ensure availability of phone/video source, confirm preferred email & phone number, and discuss instructions and expectations.  I reminded Daniel [name] to be prepared with any vital sign and/or heart rhythm information that could potentially be obtained via home monitoring, at the time of his visit. I reminded Daniel [name] to expect a phone call prior to his visit.  Zebedee Iba, CMA 06/15/2018 10:18 AM   INSTRUCTIONS FOR DOWNLOADING THE MYCHART APP TO SMARTPHONE  - The patient must first make sure to have activated MyChart and know their login information - If Apple, go to [employer] and type in MyChart in the search bar and download the app. If Android, ask patient to go to [employer] and type in [place] in the search bar and download the app. The app is free but as with any other app downloads, their phone may require them to verify saved payment information or Apple/Android password.  - The patient will need to then log into the app with their MyChart username and password, and select [facility] as their healthcare provider to link the account. When  it is time for your visit, go to the MyChart app, find appointments, and click Begin Video Visit. Be sure to Select Allow for your device to access the Microphone and Camera for your visit. You will then be connected, and your provider will be with you shortly.  **If they have any issues connecting, or need assistance please contact [employer] [phone])  **If using a computer, in order to ensure the best quality for their visit they will need to use either of the following Internet Browsers:  [employer], or Google Chrome  IF USING DOXIMITY or [email or web address].ME - The patient will receive a link just prior to their visit by text.     FULL LENGTH CONSENT FOR TELE-HEALTH VISIT   I hereby voluntarily request, consent and authorize [facility] and its employed or contracted physicians, physician assistants, nurse practitioners or other licensed health care professionals (the Practitioner), to provide me with telemedicine health care services (the "Services") as deemed necessary by the treating Practitioner. I acknowledge and consent to receive the Services by the Practitioner via telemedicine. I understand that the telemedicine visit will involve communicating with the Practitioner through live audiovisual communication technology and the disclosure of certain medical information by electronic transmission. I acknowledge that I have been given the opportunity to request an in-person assessment or other available alternative prior to the telemedicine visit and am voluntarily participating in the telemedicine visit.  I understand that I have the right to withhold or withdraw my consent to the use of telemedicine in the course of my care at any time, without affecting my right to future care or treatment, and that the Practitioner or I may terminate the telemedicine visit at any time. I understand that I have the right to inspect all information obtained and/or recorded in the course of the telemedicine visit and may receive copies of available information for a reasonable fee.  I understand that some of the potential risks of receiving the Services via telemedicine include:  [name] Delay or interruption in medical evaluation due to technological equipment failure or disruption; . Information transmitted may not be sufficient (e.g. poor resolution of images) to allow for appropriate medical decision making by the Practitioner; and/or  . In rare instances, security protocols could fail, causing a breach of  personal health information.  Furthermore, I acknowledge that it is my responsibility to provide information about my medical history, conditions and care that is complete and accurate to the best of my ability. I acknowledge that Practitioner's advice, recommendations, and/or decision may be based on factors not within their control, such as incomplete or inaccurate data provided by me or distortions of diagnostic images or specimens that may result from electronic transmissions. I understand that the practice of medicine is not an exact science and that Practitioner makes no warranties or guarantees regarding treatment outcomes. I acknowledge that I will receive a copy of this consent concurrently upon execution via email to the email address I last provided but may also request a printed copy by calling the office of [facility].    I understand that my insurance will be billed for this visit.   I have read or had this consent read to me. . I understand the contents of this consent, which adequately explains the benefits and risks of the Services being provided via telemedicine.  . I have been provided ample opportunity to ask questions regarding this consent and the Services and have had my questions answered to my  satisfaction. . I give my informed consent for the services to be provided through the use of telemedicine in my medical care  By participating in this telemedicine visit I agree to the above.

## 2018-06-18 ENCOUNTER — Other Ambulatory Visit: Payer: Self-pay | Admitting: Family Medicine

## 2018-06-18 NOTE — Telephone Encounter (Signed)
Requested medication (s) are due for refill today: no  Requested medication (s) are on the active medication list: yes  Last refill:  05/29/18  Future visit scheduled: yes  Notes to clinic:  Med was refilled on 05/29/18 for 300 gms. LOV  03/24/18  Requested Prescriptions  Pending Prescriptions Disp Refills   diclofenac sodium (VOLTAREN) 1 % GEL [Pharmacy Med Name: DICLOFENAC 1% GEL 100GM] 300 g 0    Sig: APPLY 2 GRAMS TOPICALLY TO EACH KNEE IN THE MORNING AND AT BEDTIME, APPLY 1 GRAM TO EACH KNEE IN THE AFTERNOON     Analgesics:  Topicals Passed - 06/18/2018  9:42 AM      Passed - Valid encounter within last 12 months    Recent Outpatient Visits          2 months ago Chronic anticoagulation   [facility], Ranell Patrick, MD   7 months ago History of DVT (deep vein thrombosis)   [facility], Ranell Patrick, MD   9 months ago Weakness on right side of face   [facility], Ranell Patrick, MD   11 months ago Folliculitis   [facility], Ranell Patrick, MD   1 year ago Pain in both knees, unspecified chronicity   [facility], Ranell Patrick, MD      Future Appointments            In 3 months [name], MD [facility] [place], [facility]

## 2018-06-22 ENCOUNTER — Other Ambulatory Visit: Payer: Self-pay | Admitting: Endocrinology

## 2018-06-24 ENCOUNTER — Telehealth: Payer: Self-pay | Admitting: Cardiovascular Disease

## 2018-06-24 ENCOUNTER — Encounter: Payer: Self-pay | Admitting: Cardiovascular Disease

## 2018-06-24 ENCOUNTER — Telehealth: Payer: Self-pay

## 2018-06-24 ENCOUNTER — Telehealth (INDEPENDENT_AMBULATORY_CARE_PROVIDER_SITE_OTHER): Payer: PPO | Admitting: Cardiovascular Disease

## 2018-06-24 DIAGNOSIS — Z7901 Long term (current) use of anticoagulants: Secondary | ICD-10-CM | POA: Diagnosis not present

## 2018-06-24 DIAGNOSIS — B20 Human immunodeficiency virus [HIV] disease: Secondary | ICD-10-CM | POA: Diagnosis not present

## 2018-06-24 DIAGNOSIS — R945 Abnormal results of liver function studies: Secondary | ICD-10-CM | POA: Diagnosis not present

## 2018-06-24 DIAGNOSIS — I10 Essential (primary) hypertension: Secondary | ICD-10-CM | POA: Diagnosis not present

## 2018-06-24 DIAGNOSIS — R0602 Shortness of breath: Secondary | ICD-10-CM

## 2018-06-24 DIAGNOSIS — Z8619 Personal history of other infectious and parasitic diseases: Secondary | ICD-10-CM | POA: Diagnosis not present

## 2018-06-24 DIAGNOSIS — E1142 Type 2 diabetes mellitus with diabetic polyneuropathy: Secondary | ICD-10-CM | POA: Diagnosis not present

## 2018-06-24 DIAGNOSIS — E7800 Pure hypercholesterolemia, unspecified: Secondary | ICD-10-CM | POA: Diagnosis not present

## 2018-06-24 DIAGNOSIS — R600 Localized edema: Secondary | ICD-10-CM

## 2018-06-24 DIAGNOSIS — E1159 Type 2 diabetes mellitus with other circulatory complications: Secondary | ICD-10-CM | POA: Diagnosis not present

## 2018-06-24 DIAGNOSIS — Z794 Long term (current) use of insulin: Secondary | ICD-10-CM | POA: Diagnosis not present

## 2018-06-24 DIAGNOSIS — K7581 Nonalcoholic steatohepatitis (NASH): Secondary | ICD-10-CM | POA: Diagnosis not present

## 2018-06-24 DIAGNOSIS — Z21 Asymptomatic human immunodeficiency virus [HIV] infection status: Secondary | ICD-10-CM | POA: Diagnosis not present

## 2018-06-24 MED ORDER — AMLODIPINE BESYLATE 5 MG PO TABS: 5.0000 mg | ORAL_TABLET | Freq: Every day | ORAL | 3 refills | Status: DC

## 2018-06-24 NOTE — Telephone Encounter (Signed)
Called patient to pre chart before 2:00 virtual visit. Left message to call office back before visit. I will call patient back 10 minutes prior to visit.

## 2018-06-24 NOTE — Progress Notes (Signed)
Virtual Visit via Video Note   This visit type was conducted due to national recommendations for restrictions regarding the COVID-19 Pandemic (e.g. social distancing) in an effort to limit this patient's exposure and mitigate transmission in our community.  Due to his co-morbid illnesses, this patient is at least at moderate risk for complications without adequate follow up.  This format is felt to be most appropriate for this patient at this time.  All issues noted in this document were discussed and addressed.  A limited physical exam was performed with this format.  Please refer to the patient's chart for his consent to telehealth for [facility].   Evaluation Performed:  Follow-up visit  Date:  06/25/2018   ID:  Daniel [name], Daniel [name] February 15, 1956, MRN [id number]  Patient Location: Home Provider Location: Office  PCP:  [name], MD  Cardiologist:  Skeet Latch, MD  Electrophysiologist:  None   Chief Complaint:  Follow up  History of Present Illness:    Daniel [name] was fist evaluated 02/2015 for dizziness and falls.  The symptoms had been ongoing for several months.  He felt as though the room was spinning when changing position, leaning his head back or laying down.  He was referred for an echo 03/2015 that revealed LVEF 60-65% with mild LVH.  He also had carotid Dopplers 02/2015 that revealed minimal plaque bilaterally.  Additionally, he reported exertional dyspnea so he was referred for [facility] which was negative for ischemia. In the interim Daniel [name] also had nerve conduction studies that revealed peripheral neuropathy consistent with diabetic neuropathy. He was referred for a 30 day event monitor 07/30/2015 that was unremarkable.  Daniel [name] had toes amputated on his R leg.  ABIs were normal 09/2015 prior to his great toe amputation.  At his last appointment he reported atypical chest pain.  Given that he is not very physically active it was difficult to determine if  there was an exertional component.  He was referred for a Lexiscan Myoview 03/2018 that revealed LVEF 59% and no ischemia.  Since his last appointment he had lipids checked which revealed an LDL of 113.  This was down from 154.  He had a persistent transaminitis with AST 52 and ALT 64.  This has persisted since stopping statins.  He is scheduled to have a video visit with GI later today.  Daniel [name] has been feeling well physically but struggling with depression.  He has struggled with depression for over 10 years but it is worse lately. It is always worse for him in [place].  Prior to COVID-10 he was going to the gym and had no exertional symptoms.  He was in a wheelchair 09/2017-02/2018 while healing from toe amputations and gained 25lb during that time.  He denies chest pain but has persistent shortness of breath with exertion.  He denies lower extremity edema, orthopnea or PND.  He uses his CPAP regularly.  He limits salt and fluid intake.   The patient does not have symptoms concerning for COVID-19 infection (fever, chills, cough, or new shortness of breath).    Past Medical History:  Diagnosis Date  . ADHD (attention deficit hyperactivity disorder)   . Anxiety   . Chronic kidney disease   . Clotting disorder ([place])   . Depression   . Diabetes mellitus without complication ([place])   . Diabetes mellitus, type II ([place])   . Dizziness 03/17/2015  . Essential hypertension 06/25/2018  . GERD (gastroesophageal reflux disease)   .  HIV disease ([place])   . HIV infection ([place])   . Liver disease   . OSA (obstructive sleep apnea) 07/25/2015   Uses CPAP regularly  . Peripheral vascular disease ([place])   . Ulcer    Past Surgical History:  Procedure Laterality Date  . AMPUTATION Right 10/02/2017   Procedure: RIGHT TRANSMETATARSAL AMPUTATION;  Surgeon: Leandrew Koyanagi, MD;  Location: [place];  Service: Orthopedics;  Laterality: Right;  . SMALL INTESTINE SURGERY    . STOMACH SURGERY    . TOE AMPUTATION Right 08/2016    right great toe     Current Meds  Medication Sig  . acetaminophen (TYLENOL) 500 MG tablet Take 1,000-1,500 mg by mouth every 8 (eight) hours as needed for moderate pain.   [name] ALPRAZolam (XANAX) 1 MG tablet Take 1 mg by mouth 4 (four) times daily as needed for anxiety.   [name] amphetamine-dextroamphetamine (ADDERALL) 30 MG tablet Take 30 mg by mouth 3 (three) times daily.  . Brexpiprazole (REXULTI) 1 MG TABS Take 1 mg by mouth at bedtime.  . calcium-vitamin D (OSCAL WITH D) 500-200 MG-UNIT tablet Take 1 tablet by mouth.  . Cholecalciferol (VITAMIN D3) 250 MCG (10000 UT) TABS Take by mouth.  . Continuous Blood Gluc Sensor (FREESTYLE LIBRE 14 DAY SENSOR) MISC AS DIRECTED EVERY 14 DAYS  . diclofenac sodium (VOLTAREN) 1 % GEL APPLY 2 GRAMS TOPICALLY TO EACH KNEE IN THE MORNING AND AT BEDTIME, APPLY 1 GRAM TO EACH KNEE IN THE AFTERNOON  . divalproex (DEPAKOTE ER) 500 MG 24 hr tablet Take 1 tablet (500 mg total) by mouth at bedtime.  [name] ezetimibe (ZETIA) 10 MG tablet Take 1 tablet (10 mg total) by mouth daily.  . furosemide (LASIX) 40 MG tablet Take 1 tablet (40 mg total) by mouth daily.  [name] glucose blood (FREESTYLE PRECISION NEO TEST) test strip Used to check blood sugars twice daily.  . insulin aspart (NOVOLOG FLEXPEN) 100 UNIT/ML FlexPen 10 units with breakfast, and 20 units with evening meal (Patient taking differently: Inject 10-20 Units into the skin See admin instructions. 10 units with breakfast, and 20 units with evening meal)  . Insulin Glargine (LANTUS SOLOSTAR) 100 UNIT/ML Solostar Pen Inject 250 Units into the skin every morning.  . Insulin Pen Needle (B-D ULTRAFINE III SHORT PEN) 31G X 8 MM MISC USE AS DIRECTED TO INJECT LANTUS AND NOVOLOG DAILY  . levothyroxine (SYNTHROID, LEVOTHROID) 50 MCG tablet Take 1 tablet (50 mcg total) by mouth at bedtime.  . mupirocin ointment (BACTROBAN) 2 % Place 1 application into the nose 2 (two) times daily.  . ondansetron (ZOFRAN) 8 MG tablet Take 8 mg by mouth  every 8 (eight) hours as needed for nausea or vomiting.  . pantoprazole (PROTONIX) 40 MG tablet TAKE 1 TABLET(40 MG) BY MOUTH DAILY AT 6 AM  . polyethylene glycol (MIRALAX / GLYCOLAX) packet Take 17 g by mouth 2 (two) times daily.  . pregabalin (LYRICA) 150 MG capsule Take 1 capsule (150 mg total) by mouth 3 (three) times daily.  . protriptyline (VIVACTIL) 10 MG tablet Take 10 mg by mouth 3 (three) times daily.   . rivaroxaban (XARELTO) 20 MG TABS tablet TAKE 1 TABLET(20 MG) BY MOUTH DAILY  . tiZANidine (ZANAFLEX) 4 MG tablet Take 1 tablet (4 mg total) by mouth every 6 (six) hours as needed for muscle spasms. Do not refill in less than 30 days  . TRINTELLIX 20 MG TABS tablet TK 1 T PO D  . TRIUMEQ 591-63-846 MG  tablet TAKE 1 TABLET BY MOUTH DAILY  . UNABLE TO FIND CPAP MACHINE with standard Aclaim nasal mask with humidifier. Set at 14 cwp (Patient taking differently: CPAP MACHINE with standard Aclaim nasal mask with humidifier. Set at 4 cwp)  . Vortioxetine HBr (TRINTELLIX PO) Take 25 mg by mouth at bedtime.   [name] zolpidem (AMBIEN) 10 MG tablet Take 10 mg by mouth at bedtime.      Allergies:   Aspirin; Ibuprofen; Sustiva [efavirenz]; and Nsaids   Social History   Tobacco Use  . Smoking status: Former Smoker    Packs/day: 0.10    Years: 10.00    Pack years: 1.00    Types: Cigars    Last attempt to quit: 08/09/2014    Years since quitting: 3.8  . Smokeless tobacco: Never Used  Substance Use Topics  . Alcohol use: No    Alcohol/week: 0.0 standard drinks  . Drug use: No     Family Hx: The patient's family history includes COPD in his mother; Depression in his brother; Throat cancer in his brother. There is no history of Diabetes.  ROS:   Please see the history of present illness.     All other systems reviewed and are negative.   Prior CV studies:   The following studies were reviewed today:  Carotid Doppler 03/23/15: IMPRESSION: 1. Trace smooth heterogeneous plaque in the  distal left common carotid artery. 2. No evidence of internal carotid plaque or stenosis bilaterally. 3. Vertebral arteries remain patent with normal antegrade flow.  Echo 04/05/15: Study Conclusions  - Left ventricle: The cavity size was normal. Wall thickness was  increased in a pattern of mild LVH. Systolic function was normal.  The estimated ejection fraction was in the range of 60% to 65%.  Biplane speckle tracking LVEF was not accurately measured and  therefore not reported. Wall motion was normal; there were no  regional wall motion abnormalities. Left ventricular diastolic  function parameters were normal for the patient&'s age. - Aortic valve: Mildly calcified annulus. Trileaflet. - Mitral valve: Calcified annulus. There was trivial regurgitation. - Right atrium: Central venous pressure (est): 3 mm Hg. - Tricuspid valve: There was physiologic regurgitation. - Pulmonary arteries: Systolic pressure could not be accurately  estimated. - Pericardium, extracardiac: There was no pericardial effusion.  30 day Event Monitor 07/30/15:  Quality: Fair. Baseline artifact.  Sinus rhythm and sinus tachycardia noted during an episode of syncope.  Lexiscan Myoview 03/2018:  Normal perfusion NO ischemia or scar  Nuclear stress EF: 59%.  There was no ST segment deviation noted during stress.  The study is normal.  This is a low risk study.   Labs/Other Tests and Data Reviewed:    EKG:  No ECG reviewed.  Recent Labs: 10/01/2017: Magnesium 2.0 03/24/2018: Hemoglobin 16.7; Platelets 169 04/26/2018: ALT 64; BUN 19; Creatinine, Ser 1.65; Potassium 4.6; Sodium 143   Recent Lipid Panel Lab Results  Component Value Date/Time   CHOL 201 (H) 04/26/2018 03:41 PM   TRIG 247 (H) 04/26/2018 03:41 PM   HDL 39 (L) 04/26/2018 03:41 PM   CHOLHDL 5.2 (H) 04/26/2018 03:41 PM   CHOLHDL 3.3 08/09/2015 09:56 AM   LDLCALC 113 (H) 04/26/2018 03:41 PM    Wt Readings from Last 3  Encounters:  06/24/18 265 lb (120.2 kg)  05/10/18 263 lb (119.3 kg)  04/02/18 268 lb (121.6 kg)     Objective:    BP (!) 162/78   Pulse 100   Ht 5' 11"  (1.803  m)   Wt 265 lb (120.2 kg)   BMI 36.96 kg/m  GENERAL: Well-appearing.  No acute distress. HEENT: Pupils equal round.  Oral mucosa unremarkable NECK:  No jugular venous distention, no visible thyromegaly EXT:  No edema, no cyanosis no clubbing SKIN:  No rashes no nodules NEURO:  Speech fluent.  Cranial nerves grossly intact.  Moves all 4 extremities freely PSYCH:  Cognitively intact, oriented to person place and time   ASSESSMENT & PLAN:    # Atypical chest pain: Resolved.  Lexiscan Myoview was negative for ischemia 03/2018.  # Hyperlipidemia:  He stopped his atorvastatin 2/2 myalgias and transaminitis.  GI follow up today as the elevated LFTs have not resolved after stopping the statin.  I suspect it may be due to hepatic steatosis.  He was encouraged to get back on his exercise routine.   # LE edema: Stable.  Continue furosemide:  # Hypertension: BP elevated >130/80 on multiple occasions.  Start amlodipine 44m daily.  He will work on weight loss as above.   # DVT: Recurrent, unprovoked DVT. Lifelong Xarelto.  # SOB: Stress and echo were unremarkable. Symptoms are likely due to deconditioning and obesity. Resume exercise as able.   COVID-19 Education: The signs and symptoms of COVID-19 were discussed with the patient and how to seek care for testing (follow up with PCP or arrange E-visit).  The importance of social distancing was discussed today.  Time:   Today, I have spent 21 minutes with the patient with telehealth technology discussing the above problems.     Medication Adjustments/Labs and Tests Ordered: Current medicines are reviewed at length with the patient today.  Concerns regarding medicines are outlined above.   Tests Ordered: No orders of the defined types were placed in this encounter.    Medication Changes: Meds ordered this encounter  Medications  . amLODipine (NORVASC) 5 MG tablet    Sig: Take 1 tablet (5 mg total) by mouth daily.    Dispense:  90 tablet    Refill:  3    Disposition:  Follow up with LKerin Ransom PA-C in 1 month  Signed, TSkeet Latch MD  06/25/2018 3:46 PM    [facility]

## 2018-06-24 NOTE — Patient Instructions (Addendum)
Medication Instructions:  START AMLODIPINE 5 MG DAILY    If you need a refill on your cardiac medications before your next appointment, please call your pharmacy.   Lab work: NONE  Testing/Procedures: NONE  Follow-Up: Your physician recommends that you schedule a follow-up appointment in: 1 [place] 07/27/18 AT 2:00 PM   Any Other Special Instructions Will Be Listed Below (If Applicable). MONITOR AND LOG YOUR BLOOD PRESSURE AT HOME

## 2018-06-24 NOTE — Telephone Encounter (Signed)
New Message   Pt is returning phone call about his appt    Please call back

## 2018-06-25 ENCOUNTER — Encounter: Payer: Self-pay | Admitting: Cardiovascular Disease

## 2018-06-25 DIAGNOSIS — I10 Essential (primary) hypertension: Secondary | ICD-10-CM

## 2018-06-25 HISTORY — DX: Essential (primary) hypertension: I10

## 2018-06-29 ENCOUNTER — Encounter: Payer: Self-pay | Admitting: Endocrinology

## 2018-06-30 ENCOUNTER — Telehealth: Payer: Self-pay

## 2018-06-30 NOTE — Telephone Encounter (Signed)
Please review

## 2018-06-30 NOTE — Telephone Encounter (Signed)
Called pt at Dr. Cordelia Pen request to schedule phone visit for [name] to discuss CBG's sent via [place]. LVM requesting returned call

## 2018-07-05 ENCOUNTER — Encounter: Payer: Self-pay | Admitting: Endocrinology

## 2018-07-06 ENCOUNTER — Ambulatory Visit (INDEPENDENT_AMBULATORY_CARE_PROVIDER_SITE_OTHER): Payer: PPO | Admitting: Endocrinology

## 2018-07-06 ENCOUNTER — Other Ambulatory Visit: Payer: Self-pay

## 2018-07-06 DIAGNOSIS — E119 Type 2 diabetes mellitus without complications: Secondary | ICD-10-CM

## 2018-07-06 DIAGNOSIS — Z794 Long term (current) use of insulin: Secondary | ICD-10-CM | POA: Diagnosis not present

## 2018-07-06 DIAGNOSIS — E1151 Type 2 diabetes mellitus with diabetic peripheral angiopathy without gangrene: Secondary | ICD-10-CM

## 2018-07-06 MED ORDER — INSULIN GLARGINE 100 UNIT/ML SOLOSTAR PEN: 260.0000 [IU] | PEN_INJECTOR | SUBCUTANEOUS | 99 refills | Status: DC

## 2018-07-06 NOTE — Telephone Encounter (Signed)
FYI

## 2018-07-06 NOTE — Progress Notes (Addendum)
Subjective:    Patient ID: Daniel [name], male    DOB: 10/30/1955, 63 y.o.   MRN: [id number]  HPI telehealth visit today via doxy video visit.  Alternatives to telehealth are presented to this patient, and the patient agrees to the telehealth visit. Pt is advised of the cost of the visit, and agrees to this, also.   Patient is at home, and I am at the office.   Persons attending the telehealth visit: the patient and I   Pt returns for f/u of diabetes mellitus: DM type: Insulin-requiring type 2.  Dx'ed: 7829 Complications: polyneuropathy, PAD, renal insufficiency, and foot ulcers.  Therapy: insulin since 2016. DKA: never Severe hypoglycemia: never.  Pancreatitis: never.   Other: he takes multiple daily injections, but basal insulin is emphasized, with improved results.  Interval history: I reviewed continuous glucose monitor data.  Glucose varies from 125-290.  It is in general highest in the afternoon.  pt states he feels well in general. He says size of lunch varies widely.   Past Medical History:  Diagnosis Date  . ADHD (attention deficit hyperactivity disorder)   . Anxiety   . Chronic kidney disease   . Clotting disorder ([place])   . Depression   . Diabetes mellitus without complication ([place])   . Diabetes mellitus, type II ([place])   . Dizziness 03/17/2015  . Essential hypertension 06/25/2018  . GERD (gastroesophageal reflux disease)   . HIV disease ([place])   . HIV infection ([place])   . Liver disease   . OSA (obstructive sleep apnea) 07/25/2015   Uses CPAP regularly  . Peripheral vascular disease ([place])   . Ulcer     Past Surgical History:  Procedure Laterality Date  . AMPUTATION Right 10/02/2017   Procedure: RIGHT TRANSMETATARSAL AMPUTATION;  Surgeon: Leandrew Koyanagi, MD;  Location: [place];  Service: Orthopedics;  Laterality: Right;  . SMALL INTESTINE SURGERY    . STOMACH SURGERY    . TOE AMPUTATION Right 08/2016   right great toe    Social History   Socioeconomic History  .  Marital status: Single    Spouse name: Not on file  . Number of children: Not on file  . Years of education: Not on file  . Highest education level: Not on file  Occupational History    Comment: DISABILITY  Social Needs  . Financial resource strain: Not hard at all  . Food insecurity:    Worry: Never true    Inability: Never true  . Transportation needs:    Medical: No    Non-medical: No  Tobacco Use  . Smoking status: Former Smoker    Packs/day: 0.10    Years: 10.00    Pack years: 1.00    Types: Cigars    Last attempt to quit: 08/09/2014    Years since quitting: 3.9  . Smokeless tobacco: Never Used  Substance and Sexual Activity  . Alcohol use: No    Alcohol/week: 0.0 standard drinks  . Drug use: No  . Sexual activity: Not Currently    Partners: Male    Comment: pt. declined condoms  Lifestyle  . Physical activity:    Days per week: 0 days    Minutes per session: 0 min  . Stress: Only a little  Relationships  . Social connections:    Talks on phone: More than three times a week    Gets together: Once a week    Attends religious service: Never    Active member  of club or organization: No    Attends meetings of clubs or organizations: Not on file    Relationship status: Never married  . Intimate partner violence:    Fear of current or ex partner: No    Emotionally abused: No    Physically abused: No    Forced sexual activity: No  Other Topics Concern  . Not on file  Social History Narrative   Epworth Sleepiness Scale = 7 (as of 03/16/2015)    Current Outpatient Medications on File Prior to Visit  Medication Sig Dispense Refill  . acetaminophen (TYLENOL) 500 MG tablet Take 1,000-1,500 mg by mouth every 8 (eight) hours as needed for moderate pain.     [name] ALPRAZolam (XANAX) 1 MG tablet Take 1 mg by mouth 4 (four) times daily as needed for anxiety.     [name] amLODipine (NORVASC) 5 MG tablet Take 1 tablet (5 mg total) by mouth daily. 90 tablet 3  .  amphetamine-dextroamphetamine (ADDERALL) 30 MG tablet Take 30 mg by mouth 3 (three) times daily.    . Brexpiprazole (REXULTI) 1 MG TABS Take 1 mg by mouth at bedtime.    . calcium-vitamin D (OSCAL WITH D) 500-200 MG-UNIT tablet Take 1 tablet by mouth.    . Cholecalciferol (VITAMIN D3) 250 MCG (10000 UT) TABS Take by mouth.    . Continuous Blood Gluc Sensor (FREESTYLE LIBRE 14 DAY SENSOR) MISC AS DIRECTED EVERY 14 DAYS 6 each 3  . Cyanocobalamin (VITAMIN B-12) 5000 MCG LOZG Take by mouth.    . diclofenac sodium (VOLTAREN) 1 % GEL APPLY 2 GRAMS TOPICALLY TO EACH KNEE IN THE MORNING AND AT BEDTIME, APPLY 1 GRAM TO EACH KNEE IN THE AFTERNOON 300 g 0  . divalproex (DEPAKOTE ER) 500 MG 24 hr tablet Take 1 tablet (500 mg total) by mouth at bedtime. 90 tablet 1  . ezetimibe (ZETIA) 10 MG tablet Take 1 tablet (10 mg total) by mouth daily. 90 tablet 3  . glucose blood (FREESTYLE PRECISION NEO TEST) test strip Used to check blood sugars twice daily. 100 each 12  . insulin aspart (NOVOLOG FLEXPEN) 100 UNIT/ML FlexPen 10 units with breakfast, and 20 units with evening meal (Patient taking differently: Inject 10-20 Units into the skin See admin instructions. 10 units with breakfast, and 20 units with evening meal) 30 mL 11  . Insulin Pen Needle (B-D ULTRAFINE III SHORT PEN) 31G X 8 MM MISC USE AS DIRECTED TO INJECT LANTUS AND NOVOLOG DAILY 100 each 0  . levothyroxine (SYNTHROID, LEVOTHROID) 50 MCG tablet Take 1 tablet (50 mcg total) by mouth at bedtime. 30 tablet 11  . mupirocin ointment (BACTROBAN) 2 % Place 1 application into the nose 2 (two) times daily. 22 g 3  . ondansetron (ZOFRAN) 8 MG tablet Take 8 mg by mouth every 8 (eight) hours as needed for nausea or vomiting.    . pantoprazole (PROTONIX) 40 MG tablet TAKE 1 TABLET(40 MG) BY MOUTH DAILY AT 6 AM 90 tablet 0  . polyethylene glycol (MIRALAX / GLYCOLAX) packet Take 17 g by mouth 2 (two) times daily. 30 each 0  . pregabalin (LYRICA) 150 MG capsule Take 1  capsule (150 mg total) by mouth 3 (three) times daily. 270 capsule 1  . protriptyline (VIVACTIL) 10 MG tablet Take 10 mg by mouth 3 (three) times daily.   11  . rivaroxaban (XARELTO) 20 MG TABS tablet TAKE 1 TABLET(20 MG) BY MOUTH DAILY 90 tablet 1  . tiZANidine (ZANAFLEX) 4 MG  tablet Take 1 tablet (4 mg total) by mouth every 6 (six) hours as needed for muscle spasms. Do not refill in less than 30 days 20 tablet 6  . TRINTELLIX 20 MG TABS tablet TK 1 T PO D    . TRIUMEQ 600-50-300 MG tablet TAKE 1 TABLET BY MOUTH DAILY 30 tablet 4  . UNABLE TO FIND CPAP MACHINE with standard Aclaim nasal mask with humidifier. Set at 14 cwp (Patient taking differently: CPAP MACHINE with standard Aclaim nasal mask with humidifier. Set at 4 cwp) 1 each 0  . Vortioxetine HBr (TRINTELLIX PO) Take 25 mg by mouth at bedtime.     [name] zolpidem (AMBIEN) 10 MG tablet Take 10 mg by mouth at bedtime.     . furosemide (LASIX) 40 MG tablet Take 1 tablet (40 mg total) by mouth daily. 30 tablet 5   No current facility-administered medications on file prior to visit.     Allergies  Allergen Reactions  . Aspirin Swelling  . Ibuprofen Swelling  . Sustiva [Efavirenz] Swelling and Rash  . Nsaids Other (See Comments)    unknwn    Family History  Problem Relation Age of Onset  . Depression Brother   . Throat cancer Brother        half brother, never smoker  . COPD Mother   . Diabetes Neg Hx      Review of Systems He denies hypoglycemia.     Objective:   Physical Exam       Assessment & Plan:  Insulin-requiring type 2 DM, with PAD: he needs increased rx   Patient Instructions  check your blood sugar twice a day.  vary the time of day when you check, between before the 3 meals, and at bedtime.  also check if you have symptoms of your blood sugar being too high or too low.  please keep a record of the readings and bring it to your next appointment here (or you can bring the meter itself).  You can write it on any  piece of paper.  please call us sooner if your blood sugar goes below 70, or if you have a lot of readings over 200.   I have sent a prescription to your pharmacy, to increase the lantus to 260 units each morning, and: Please continue the same novolog.   Please come back for a follow-up appointment in 2 months.

## 2018-07-06 NOTE — Patient Instructions (Addendum)
check your blood sugar twice a day.  vary the time of day when you check, between before the 3 meals, and at bedtime.  also check if you have symptoms of your blood sugar being too high or too low.  please keep a record of the readings and bring it to your next appointment here (or you can bring the meter itself).  You can write it on any piece of paper.  please call us sooner if your blood sugar goes below 70, or if you have a lot of readings over 200.   I have sent a prescription to your pharmacy, to increase the lantus to 260 units each morning, and: Please continue the same novolog.   Please come back for a follow-up appointment in 2 months.

## 2018-07-13 ENCOUNTER — Encounter: Payer: Self-pay | Admitting: Endocrinology

## 2018-07-13 NOTE — Telephone Encounter (Signed)
Please review and advise.

## 2018-07-18 ENCOUNTER — Other Ambulatory Visit: Payer: Self-pay | Admitting: Family Medicine

## 2018-07-23 ENCOUNTER — Telehealth: Payer: Self-pay | Admitting: Cardiology

## 2018-07-23 NOTE — Telephone Encounter (Signed)
[place], smartphone, consent (verbal), pre reg complete 07/23/18 AF

## 2018-07-27 ENCOUNTER — Encounter: Payer: Self-pay | Admitting: Endocrinology

## 2018-07-27 ENCOUNTER — Telehealth: Payer: Self-pay

## 2018-07-27 ENCOUNTER — Telehealth: Payer: PPO | Admitting: Cardiology

## 2018-07-27 DIAGNOSIS — E785 Hyperlipidemia, unspecified: Secondary | ICD-10-CM | POA: Insufficient documentation

## 2018-07-27 NOTE — Telephone Encounter (Signed)
Left Voicemail for patient to call office back to prechart before the 2:00pm visit with [name]

## 2018-07-27 NOTE — Telephone Encounter (Signed)
VERBAL CONSENT GIVEN TO ASHLEY FEATHERSTONE ON 07/23/2018 AT 1:39PM.      Virtual Visit Pre-Appointment Phone Call  "Daniel [name], I am calling you today to discuss your upcoming appointment. We are currently trying to limit exposure to the virus that causes COVID-19 by seeing patients at home rather than in the office."  1. "What is the BEST phone number to call the day of the visit?" - include this in appointment notes  2. "Do you have or have access to (through a family member/friend) a smartphone with video capability that we can use for your visit?" a. If yes - list this number in appt notes as "cell" (if different from BEST phone #) and list the appointment type as a VIDEO visit in appointment notes b. If no - list the appointment type as a PHONE visit in appointment notes  3. Confirm consent - "In the setting of the current Covid19 crisis, you are scheduled for a (phone or video) visit with your provider on (date) at (time).  Just as we do with many in-office visits, in order for you to participate in this visit, we must obtain consent.  If you'd like, I can send this to your mychart (if signed up) or email for you to review.  Otherwise, I can obtain your verbal consent now.  All virtual visits are billed to your insurance company just like a normal visit would be.  By agreeing to a virtual visit, we'd like you to understand that the technology does not allow for your provider to perform an examination, and thus may limit your provider's ability to fully assess your condition. If your provider identifies any concerns that need to be evaluated in person, we will make arrangements to do so.  Finally, though the technology is pretty good, we cannot assure that it will always work on either your or our end, and in the setting of a video visit, we may have to convert it to a phone-only visit.  In either situation, we cannot ensure that we have a secure connection.  Are you willing to proceed?" STAFF:  Did the patient verbally acknowledge consent to telehealth visit? Document YES/NO here: YES  4. Advise patient to be prepared - "Two hours prior to your appointment, go ahead and check your blood pressure, pulse, oxygen saturation, and your weight (if you have the equipment to check those) and write them all down. When your visit starts, your provider will ask you for this information. If you have an Apple Watch or Kardia device, please plan to have heart rate information ready on the day of your appointment. Please have a pen and paper handy nearby the day of the visit as well."  5. Give patient instructions for MyChart download to smartphone OR Doximity/Doxy.me as below if video visit (depending on what platform provider is using)  6. Inform patient they will receive a phone call 15 minutes prior to their appointment time (may be from unknown caller ID) so they should be prepared to answer    TELEPHONE CALL NOTE  Daniel [name] has been deemed a candidate for a follow-up tele-health visit to limit community exposure during the Covid-19 pandemic. I spoke with the patient via phone to ensure availability of phone/video source, confirm preferred email & phone number, and discuss instructions and expectations.  I reminded Daniel [name] to be prepared with any vital sign and/or heart rhythm information that could potentially be obtained via home monitoring, at the time of  his visit. I reminded Daniel [name] to expect a phone call prior to his visit.  Harold Hedge, CMA 07/27/2018 7:47 AM   INSTRUCTIONS FOR DOWNLOADING THE MYCHART APP TO SMARTPHONE  - The patient must first make sure to have activated MyChart and know their login information - If Apple, go to [employer] and type in MyChart in the search bar and download the app. If Android, ask patient to go to [employer] and type in [place] in the search bar and download the app. The app is free but as with any other app downloads, their  phone may require them to verify saved payment information or Apple/Android password.  - The patient will need to then log into the app with their MyChart username and password, and select [facility] as their healthcare provider to link the account. When it is time for your visit, go to the MyChart app, find appointments, and click Begin Video Visit. Be sure to Select Allow for your device to access the Microphone and Camera for your visit. You will then be connected, and your provider will be with you shortly.  **If they have any issues connecting, or need assistance please contact MyChart service desk [phone])**  **If using a computer, in order to ensure the best quality for their visit they will need to use either of the following Internet Browsers: [employer], or Google Chrome**  IF USING DOXIMITY or DOXY.ME - The patient will receive a link just prior to their visit by text.     FULL LENGTH CONSENT FOR TELE-HEALTH VISIT   I hereby voluntarily request, consent and authorize [facility] and its employed or contracted physicians, physician assistants, nurse practitioners or other licensed health care professionals (the Practitioner), to provide me with telemedicine health care services (the "Services") as deemed necessary by the treating Practitioner. I acknowledge and consent to receive the Services by the Practitioner via telemedicine. I understand that the telemedicine visit will involve communicating with the Practitioner through live audiovisual communication technology and the disclosure of certain medical information by electronic transmission. I acknowledge that I have been given the opportunity to request an in-person assessment or other available alternative prior to the telemedicine visit and am voluntarily participating in the telemedicine visit.  I understand that I have the right to withhold or withdraw my consent to the use of telemedicine in the course of  my care at any time, without affecting my right to future care or treatment, and that the Practitioner or I may terminate the telemedicine visit at any time. I understand that I have the right to inspect all information obtained and/or recorded in the course of the telemedicine visit and may receive copies of available information for a reasonable fee.  I understand that some of the potential risks of receiving the Services via telemedicine include:  [name] Delay or interruption in medical evaluation due to technological equipment failure or disruption; . Information transmitted may not be sufficient (e.g. poor resolution of images) to allow for appropriate medical decision making by the Practitioner; and/or  . In rare instances, security protocols could fail, causing a breach of personal health information.  Furthermore, I acknowledge that it is my responsibility to provide information about my medical history, conditions and care that is complete and accurate to the best of my ability. I acknowledge that Practitioner's advice, recommendations, and/or decision may be based on factors not within their control, such as incomplete or inaccurate data provided by  me or distortions of diagnostic images or specimens that may result from electronic transmissions. I understand that the practice of medicine is not an exact science and that Practitioner makes no warranties or guarantees regarding treatment outcomes. I acknowledge that I will receive a copy of this consent concurrently upon execution via email to the email address I last provided but may also request a printed copy by calling the office of [facility].    I understand that my insurance will be billed for this visit.   I have read or had this consent read to me. . I understand the contents of this consent, which adequately explains the benefits and risks of the Services being provided via telemedicine.  . I have been provided ample opportunity to ask  questions regarding this consent and the Services and have had my questions answered to my satisfaction. . I give my informed consent for the services to be provided through the use of telemedicine in my medical care  By participating in this telemedicine visit I agree to the above.

## 2018-07-28 NOTE — Telephone Encounter (Signed)
Please refer to pt response

## 2018-08-06 DIAGNOSIS — G4733 Obstructive sleep apnea (adult) (pediatric): Secondary | ICD-10-CM | POA: Diagnosis not present

## 2018-08-12 ENCOUNTER — Telehealth: Payer: Self-pay | Admitting: Orthopaedic Surgery

## 2018-08-12 ENCOUNTER — Other Ambulatory Visit: Payer: Self-pay | Admitting: Endocrinology

## 2018-08-12 NOTE — Telephone Encounter (Signed)
Please advise 

## 2018-08-12 NOTE — Telephone Encounter (Signed)
Yes that's fine. Let's submit for them.  Thanks.

## 2018-08-12 NOTE — Telephone Encounter (Signed)
Patient called advised he would like to get the SynviscOne injection in both of his knees again. The number to contact patient   Is [phone]

## 2018-08-13 NOTE — Telephone Encounter (Signed)
Please submit for Bil  Gel injections. Thank you.

## 2018-08-19 ENCOUNTER — Telehealth: Payer: Self-pay | Admitting: Cardiology

## 2018-08-19 NOTE — Telephone Encounter (Signed)
Daniel [name], not sure this was linked to the correct message- he has appointment for 06/30- pre reg call? And please close out this encounter.

## 2018-08-19 NOTE — Telephone Encounter (Signed)
Phone visit/my chart/pre reg complete/consent obtained

## 2018-08-19 NOTE — Telephone Encounter (Signed)
New Message            Patient is returning [place] call, would like a call back.

## 2018-08-20 ENCOUNTER — Telehealth: Payer: Self-pay | Admitting: Radiology

## 2018-08-20 NOTE — Telephone Encounter (Signed)
I called, LM that we can go ahead with Monovisc Injections.  His insurance does not require PA for Monovisc.  Ok to buy and bill, bilateral knees.  I have asked him to call the office to schedule an appt with Dr Erlinda Hong for these.

## 2018-08-20 NOTE — Telephone Encounter (Signed)
Printed demographics for submission of gel injection PA

## 2018-08-23 DIAGNOSIS — H25813 Combined forms of age-related cataract, bilateral: Secondary | ICD-10-CM | POA: Diagnosis not present

## 2018-08-23 DIAGNOSIS — H5203 Hypermetropia, bilateral: Secondary | ICD-10-CM | POA: Diagnosis not present

## 2018-08-23 DIAGNOSIS — Z794 Long term (current) use of insulin: Secondary | ICD-10-CM | POA: Diagnosis not present

## 2018-08-23 DIAGNOSIS — H524 Presbyopia: Secondary | ICD-10-CM | POA: Diagnosis not present

## 2018-08-23 DIAGNOSIS — E119 Type 2 diabetes mellitus without complications: Secondary | ICD-10-CM | POA: Diagnosis not present

## 2018-08-23 DIAGNOSIS — H52203 Unspecified astigmatism, bilateral: Secondary | ICD-10-CM | POA: Diagnosis not present

## 2018-08-23 DIAGNOSIS — H25811 Combined forms of age-related cataract, right eye: Secondary | ICD-10-CM | POA: Diagnosis not present

## 2018-08-23 DIAGNOSIS — H25812 Combined forms of age-related cataract, left eye: Secondary | ICD-10-CM | POA: Diagnosis not present

## 2018-08-24 ENCOUNTER — Telehealth: Payer: Self-pay | Admitting: Cardiology

## 2018-08-24 ENCOUNTER — Encounter: Payer: Self-pay | Admitting: Cardiology

## 2018-08-24 ENCOUNTER — Telehealth (INDEPENDENT_AMBULATORY_CARE_PROVIDER_SITE_OTHER): Payer: PPO | Admitting: Cardiology

## 2018-08-24 ENCOUNTER — Telehealth: Payer: Self-pay

## 2018-08-24 VITALS — BP 156/84 | HR 87 | Ht 71.0 in | Wt 275.0 lb

## 2018-08-24 DIAGNOSIS — E119 Type 2 diabetes mellitus without complications: Secondary | ICD-10-CM

## 2018-08-24 DIAGNOSIS — Z86718 Personal history of other venous thrombosis and embolism: Secondary | ICD-10-CM

## 2018-08-24 DIAGNOSIS — E1142 Type 2 diabetes mellitus with diabetic polyneuropathy: Secondary | ICD-10-CM

## 2018-08-24 DIAGNOSIS — I10 Essential (primary) hypertension: Secondary | ICD-10-CM

## 2018-08-24 DIAGNOSIS — N183 Chronic kidney disease, stage 3 unspecified: Secondary | ICD-10-CM

## 2018-08-24 DIAGNOSIS — Z7901 Long term (current) use of anticoagulants: Secondary | ICD-10-CM

## 2018-08-24 DIAGNOSIS — Z89431 Acquired absence of right foot: Secondary | ICD-10-CM

## 2018-08-24 DIAGNOSIS — Z794 Long term (current) use of insulin: Secondary | ICD-10-CM

## 2018-08-24 NOTE — Telephone Encounter (Signed)
Patient would like a call back

## 2018-08-24 NOTE — Telephone Encounter (Signed)
Contacted patient to discuss AVS Instructions. Gave patient Luke's recommendations from today's virtual office visit. Informed patient that someone from the scheduling dept will be in contact with him to schedule his follow up appt. Patient voiced understanding and AVS mailed.

## 2018-08-24 NOTE — Telephone Encounter (Signed)
Per pt is currently taking Fish Oil and Zetia .Pt had been on Atorvastatin 10 mg in past but was stopped due to leg pain./cy

## 2018-08-24 NOTE — Progress Notes (Signed)
Virtual Visit via Video Note   This visit type was conducted due to national recommendations for restrictions regarding the COVID-19 Pandemic (e.g. social distancing) in an effort to limit this patient's exposure and mitigate transmission in our community.  Due to his co-morbid illnesses, this patient is at least at moderate risk for complications without adequate follow up.  This format is felt to be most appropriate for this patient at this time.  All issues noted in this document were discussed and addressed.  A limited physical exam was performed with this format.  Please refer to the patient's chart for his consent to telehealth for [facility].   Date:  08/24/2018   ID:  Daniel [name], Daniel [name] 1955-11-06, MRN [id number]  Patient Location: Home Provider Location: Office  PCP:  [name], MD  Cardiologist:  Skeet Latch, MD  Electrophysiologist:  None   Evaluation Performed:  Follow-Up Visit  Chief Complaint:  none  History of Present Illness:    Daniel [name] is a 63 y.o. male with a history of diabetes and neuropathy and chronic lower extremity pain.  He is been seen in the past for near syncope.  He has had a echocardiogram, carotid Dopplers, Myoview, and monitor all which were essentially unremarkable.  He saw Dr. [name] in April.  He had come off statin therapy.  He had some elevated LFTs and was referred to Dr. [name].  I do not have access to those records but the patient tells me Dr. Oletta Lamas apparently did not feel there was anything dangerous going on, Dr. [name] suspected it was a fatty liver.  Since that time the patient has done well from a cardiac standpoint.  His main complaint is pain in his legs.  He denies any palpitations or chest pain.  The patient does not have symptoms concerning for COVID-19 infection (fever, chills, cough, or new shortness of breath).    Past Medical History:  Diagnosis Date  . ADHD (attention deficit hyperactivity  disorder)   . Anxiety   . Chronic kidney disease   . Clotting disorder ([place])   . Depression   . Diabetes mellitus without complication ([place])   . Diabetes mellitus, type II ([place])   . Dizziness 03/17/2015  . Essential hypertension 06/25/2018  . GERD (gastroesophageal reflux disease)   . HIV disease ([place])   . HIV infection ([place])   . Liver disease   . OSA (obstructive sleep apnea) 07/25/2015   Uses CPAP regularly  . Peripheral vascular disease ([place])   . Ulcer    Past Surgical History:  Procedure Laterality Date  . AMPUTATION Right 10/02/2017   Procedure: RIGHT TRANSMETATARSAL AMPUTATION;  Surgeon: Leandrew Koyanagi, MD;  Location: [facility];  Service: Orthopedics;  Laterality: Right;  . SMALL INTESTINE SURGERY    . STOMACH SURGERY    . TOE AMPUTATION Right 08/2016   right great toe     Current Meds  Medication Sig  . acetaminophen (TYLENOL) 500 MG tablet Take 1,000-1,500 mg by mouth every 8 (eight) hours as needed for moderate pain.   [name] ALPRAZolam (XANAX) 1 MG tablet Take 1 mg by mouth 4 (four) times daily as needed for anxiety.   [name] amLODipine (NORVASC) 5 MG tablet Take 1 tablet (5 mg total) by mouth daily.  [name] amphetamine-dextroamphetamine (ADDERALL) 30 MG tablet Take 30 mg by mouth 3 (three) times daily.  . Brexpiprazole (REXULTI) 1 MG TABS Take 1 mg by mouth at bedtime.  . calcium-vitamin D (  OSCAL WITH D) 500-200 MG-UNIT tablet Take 1 tablet by mouth.  . Cholecalciferol (VITAMIN D3) 250 MCG (10000 UT) TABS Take by mouth.  . Continuous Blood Gluc Sensor (FREESTYLE LIBRE 14 DAY SENSOR) MISC AS DIRECTED EVERY 14 DAYS  . Cyanocobalamin (VITAMIN B-12) 5000 MCG LOZG Take by mouth.  . diclofenac sodium (VOLTAREN) 1 % GEL APPLY 2 GRAMS TOPICALLY TO EACH KNEE IN THE MORNING AND AT BEDTIME, APPLY 1 GRAM TO EACH KNEE IN THE AFTERNOON  . divalproex (DEPAKOTE ER) 500 MG 24 hr tablet Take 1 tablet (500 mg total) by mouth at bedtime.  [name] glucose blood (FREESTYLE PRECISION NEO TEST) test strip Used to check  blood sugars twice daily.  . insulin aspart (NOVOLOG FLEXPEN) 100 UNIT/ML FlexPen 10 units with breakfast, and 20 units with evening meal (Patient taking differently: Inject 10-20 Units into the skin See admin instructions. 10 units with breakfast, and 20 units with evening meal)  . Insulin Glargine (LANTUS SOLOSTAR) 100 UNIT/ML Solostar Pen Inject 260 Units into the skin every morning.  . Insulin Pen Needle (B-D ULTRAFINE III SHORT PEN) 31G X 8 MM MISC USE AS DIRECTED TO INJECT LANTUS AND NOVOLOG DAILY  . levothyroxine (SYNTHROID, LEVOTHROID) 50 MCG tablet Take 1 tablet (50 mcg total) by mouth at bedtime.  . mupirocin ointment (BACTROBAN) 2 % Place 1 application into the nose 2 (two) times daily.  . ondansetron (ZOFRAN) 8 MG tablet Take 8 mg by mouth every 8 (eight) hours as needed for nausea or vomiting.  . pantoprazole (PROTONIX) 40 MG tablet TAKE 1 TABLET(40 MG) BY MOUTH DAILY AT 6 AM  . polyethylene glycol (MIRALAX / GLYCOLAX) packet Take 17 g by mouth 2 (two) times daily.  . pregabalin (LYRICA) 150 MG capsule Take 1 capsule (150 mg total) by mouth 3 (three) times daily.  . protriptyline (VIVACTIL) 10 MG tablet Take 10 mg by mouth 3 (three) times daily.   . rivaroxaban (XARELTO) 20 MG TABS tablet TAKE 1 TABLET(20 MG) BY MOUTH DAILY  . tiZANidine (ZANAFLEX) 4 MG tablet Take 1 tablet (4 mg total) by mouth every 6 (six) hours as needed for muscle spasms. Do not refill in less than 30 days  . TRINTELLIX 20 MG TABS tablet TK 1 T PO D  . TRIUMEQ 600-50-300 MG tablet TAKE 1 TABLET BY MOUTH DAILY  . UNABLE TO FIND CPAP MACHINE with standard Aclaim nasal mask with humidifier. Set at 14 cwp (Patient taking differently: CPAP MACHINE with standard Aclaim nasal mask with humidifier. Set at 4 cwp)  . Vortioxetine HBr (TRINTELLIX PO) Take 25 mg by mouth at bedtime.   [name] zolpidem (AMBIEN) 10 MG tablet Take 10 mg by mouth at bedtime.      Allergies:   Aspirin, Ibuprofen, Sustiva [efavirenz], and Nsaids    Social History   Tobacco Use  . Smoking status: Former Smoker    Packs/day: 0.10    Years: 10.00    Pack years: 1.00    Types: Cigars    Quit date: 08/09/2014    Years since quitting: 4.0  . Smokeless tobacco: Never Used  Substance Use Topics  . Alcohol use: No    Alcohol/week: 0.0 standard drinks  . Drug use: No     Family Hx: The patient's family history includes COPD in his mother; Depression in his brother; Throat cancer in his brother. There is no history of Diabetes.  ROS:   Please see the history of present illness.    All other systems  reviewed and are negative.   Prior CV studies:   The following studies were reviewed today:  Echo 2017 Myoview Feb 2020  Labs/Other Tests and Data Reviewed:    EKG:  No ECG reviewed.  Recent Labs: 10/01/2017: Magnesium 2.0 03/24/2018: Hemoglobin 16.7; Platelets 169 04/26/2018: ALT 64; BUN 19; Creatinine, Ser 1.65; Potassium 4.6; Sodium 143   Recent Lipid Panel Lab Results  Component Value Date/Time   CHOL 201 (H) 04/26/2018 03:41 PM   TRIG 247 (H) 04/26/2018 03:41 PM   HDL 39 (L) 04/26/2018 03:41 PM   CHOLHDL 5.2 (H) 04/26/2018 03:41 PM   CHOLHDL 3.3 08/09/2015 09:56 AM   LDLCALC 113 (H) 04/26/2018 03:41 PM    Wt Readings from Last 3 Encounters:  08/24/18 275 lb (124.7 kg)  06/24/18 265 lb (120.2 kg)  05/10/18 263 lb (119.3 kg)     Objective:    Vital Signs:  BP (!) 156/84   Pulse 87   Ht 5' 11"  (1.803 m)   Wt 275 lb (124.7 kg)   BMI 38.35 kg/m    VITAL SIGNS:  reviewed  ASSESSMENT & PLAN:    Diabetic neuropathy Negative cardiac work up  H/O DVT- Chronic Xarelto  PVD- Rt great toe amputation Aug 2019  CRI-3 Last GFR 43-March 2020  Elevated LFTs- Benign work up by Dr Oletta Lamas per patient  OSA- On C-pap  HLD- Records indicate myalgias stopped statin Rx in the past but the patient didn't know about that- " I have pain all the time".  His LDL was 113 in March on Zetia.   IDDM- With neuropathy and  CRI COVID-19 Education: The signs and symptoms of COVID-19 were discussed with the patient and how to seek care for testing (follow up with PCP or arrange E-visit).  The importance of social distancing was discussed today.  Time:   Today, I have spent 10 minutes with the patient with telehealth technology discussing the above problems.     Medication Adjustments/Labs and Tests Ordered: Current medicines are reviewed at length with the patient today.  Concerns regarding medicines are outlined above.   Tests Ordered: No orders of the defined types were placed in this encounter.   Medication Changes: No orders of the defined types were placed in this encounter.   Follow Up:  In Person Dr [name] 3 months.  I will request records from Dr Oletta Lamas  Signed, [name], PA-C  08/24/2018 11:39 AM    [place]

## 2018-08-24 NOTE — Patient Instructions (Signed)
Medication Instructions:  Your physician recommends that you continue on your current medications as directed. Please refer to the Current Medication list given to you today. If you need a refill on your cardiac medications before your next appointment, please call your pharmacy.   Lab work: None  If you have labs (blood work) drawn today and your tests are completely normal, you will receive your results only by: [name] MyChart Message (if you have MyChart) OR . A paper copy in the mail If you have any lab test that is abnormal or we need to change your treatment, we will call you to review the results.  Testing/Procedures: None   Follow-Up: At [facility], you and your health needs are our priority.  As part of our continuing mission to provide you with exceptional heart care, we have created designated Provider Care Teams.  These Care Teams include your primary Cardiologist (physician) and Advanced Practice Providers (APPs -  Physician Assistants and Nurse Practitioners) who all work together to provide you with the care you need, when you need it. You will need a follow up appointment in 3 months.  Please call our office 2 months in advance to schedule this appointment.  You may see Skeet Latch, MD or one of the following Advanced Practice Providers on your designated Care Team:   [name], PA-C Roby Lofts, [place] . [name], PA-C  Any Other Special Instructions Will Be Listed Below (If Applicable).

## 2018-08-25 ENCOUNTER — Telehealth: Payer: Self-pay

## 2018-08-25 ENCOUNTER — Encounter: Payer: Self-pay | Admitting: Cardiology

## 2018-08-25 NOTE — Telephone Encounter (Signed)
Called and left a Vm for patient to [facility] to schedule an appointment with [name] for gel injection.  Approved for Monovisc, bilateral knee. Buy & Bill No Co-pay No PA required

## 2018-08-26 ENCOUNTER — Ambulatory Visit: Payer: PPO | Admitting: Family Medicine

## 2018-08-30 ENCOUNTER — Ambulatory Visit: Payer: PPO | Admitting: Family Medicine

## 2018-08-31 ENCOUNTER — Ambulatory Visit: Payer: PPO | Admitting: Orthopaedic Surgery

## 2018-09-01 ENCOUNTER — Ambulatory Visit: Payer: PPO | Admitting: Orthopaedic Surgery

## 2018-09-02 ENCOUNTER — Telehealth: Payer: Self-pay | Admitting: Family Medicine

## 2018-09-02 ENCOUNTER — Ambulatory Visit: Payer: PPO | Admitting: Family Medicine

## 2018-09-02 NOTE — Telephone Encounter (Signed)
LVM to reschedule appt for today. Told patient I am going to go ahead and cancel the appt and to give [name] a call to r/s

## 2018-09-06 ENCOUNTER — Other Ambulatory Visit: Payer: Self-pay

## 2018-09-07 ENCOUNTER — Ambulatory Visit (INDEPENDENT_AMBULATORY_CARE_PROVIDER_SITE_OTHER): Payer: PPO | Admitting: Endocrinology

## 2018-09-07 ENCOUNTER — Encounter: Payer: Self-pay | Admitting: Orthopaedic Surgery

## 2018-09-07 ENCOUNTER — Encounter: Payer: Self-pay | Admitting: Endocrinology

## 2018-09-07 ENCOUNTER — Ambulatory Visit (INDEPENDENT_AMBULATORY_CARE_PROVIDER_SITE_OTHER): Payer: PPO | Admitting: Orthopaedic Surgery

## 2018-09-07 VITALS — BP 168/82 | HR 105 | Ht 71.0 in | Wt 274.6 lb

## 2018-09-07 DIAGNOSIS — Z794 Long term (current) use of insulin: Secondary | ICD-10-CM

## 2018-09-07 DIAGNOSIS — G8929 Other chronic pain: Secondary | ICD-10-CM

## 2018-09-07 DIAGNOSIS — M1711 Unilateral primary osteoarthritis, right knee: Secondary | ICD-10-CM | POA: Diagnosis not present

## 2018-09-07 DIAGNOSIS — E0849 Diabetes mellitus due to underlying condition with other diabetic neurological complication: Secondary | ICD-10-CM | POA: Diagnosis not present

## 2018-09-07 DIAGNOSIS — M25561 Pain in right knee: Secondary | ICD-10-CM

## 2018-09-07 DIAGNOSIS — E119 Type 2 diabetes mellitus without complications: Secondary | ICD-10-CM | POA: Diagnosis not present

## 2018-09-07 DIAGNOSIS — M1712 Unilateral primary osteoarthritis, left knee: Secondary | ICD-10-CM

## 2018-09-07 LAB — POCT GLYCOSYLATED HEMOGLOBIN (HGB A1C): Hemoglobin A1C: 7.8 % — AB (ref 4.0–5.6)

## 2018-09-07 MED ORDER — HYALURONAN 88 MG/4ML IX SOSY
88.0000 mg | PREFILLED_SYRINGE | INTRA_ARTICULAR | Status: AC | PRN
  Administered 2018-09-07: 88 mg via INTRA_ARTICULAR

## 2018-09-07 MED ORDER — NOVOLOG FLEXPEN 100 UNIT/ML ~~LOC~~ SOPN: PEN_INJECTOR | SUBCUTANEOUS | 11 refills | Status: DC

## 2018-09-07 MED ORDER — LANTUS SOLOSTAR 100 UNIT/ML ~~LOC~~ SOPN: 270.0000 [IU] | PEN_INJECTOR | SUBCUTANEOUS | 99 refills | Status: DC

## 2018-09-07 NOTE — Progress Notes (Signed)
   Procedure Note  Patient: Daniel [name]             Date of Birth: 27-Dec-1955           MRN: [id number]             Visit Date: 09/07/2018  Procedures: Visit Diagnoses:  1. Chronic pain of both knees     Large Joint Inj: bilateral knee on 09/07/2018 6:30 PM Indications: pain Details: 22 G needle  Arthrogram: No  Medications (Right): 88 mg Hyaluronan 88 MG/4ML Medications (Left): 88 mg Hyaluronan 88 MG/4ML Outcome: tolerated well, no immediate complications Patient was prepped and draped in the usual sterile fashion.

## 2018-09-07 NOTE — Patient Instructions (Addendum)
Your blood pressure is high today.  Please see your primary care provider soon, to have it rechecked check your blood sugar twice a day.  vary the time of day when you check, between before the 3 meals, and at bedtime.  also check if you have symptoms of your blood sugar being too high or too low.  please keep a record of the readings and bring it to your next appointment here (or you can bring the meter itself).  You can write it on any piece of paper.  please call us sooner if your blood sugar goes below 70, or if you have a lot of readings over 200.   I have sent a prescription to your pharmacy, to increase the lantus to 270 units each morning, and: Please reduce the supper novolog to 20 units.   Please come back for a follow-up appointment in 2 months.

## 2018-09-07 NOTE — Progress Notes (Signed)
Subjective:    Patient ID: Daniel [name], male    DOB: April 22, 1955, 63 y.o.   MRN: [id number]  HPI Pt returns for f/u of diabetes mellitus: DM type: Insulin-requiring type 2.  Dx'ed: 7948 Complications: polyneuropathy, PAD, renal insufficiency, and foot ulcers.  Therapy: insulin since 2016. DKA: never Severe hypoglycemia: never.  Pancreatitis: never.   Other: he takes multiple daily injections, but basal insulin is emphasized, with improved results.  Interval history:no cbg record, but states cbg's vary from 53-400.  He has mild hypoglycemia approx twice per month. This usually happens at HS.  pt states he misses insulin 1-2 times per month.   Past Medical History:  Diagnosis Date  . ADHD (attention deficit hyperactivity disorder)   . Anxiety   . Chronic kidney disease   . Clotting disorder ([place])   . Depression   . Diabetes mellitus without complication ([place])   . Diabetes mellitus, type II ([place])   . Dizziness 03/17/2015  . Essential hypertension 06/25/2018  . GERD (gastroesophageal reflux disease)   . HIV disease ([place])   . HIV infection ([place])   . Liver disease   . OSA (obstructive sleep apnea) 07/25/2015   Uses CPAP regularly  . Peripheral vascular disease ([place])   . Ulcer     Past Surgical History:  Procedure Laterality Date  . AMPUTATION Right 10/02/2017   Procedure: RIGHT TRANSMETATARSAL AMPUTATION;  Surgeon: Leandrew Koyanagi, MD;  Location: [facility];  Service: Orthopedics;  Laterality: Right;  . SMALL INTESTINE SURGERY    . STOMACH SURGERY    . TOE AMPUTATION Right 08/2016   right great toe    Social History   Socioeconomic History  . Marital status: Single    Spouse name: Not on file  . Number of children: Not on file  . Years of education: Not on file  . Highest education level: Not on file  Occupational History    Comment: DISABILITY  Social Needs  . Financial resource strain: Not hard at all  . Food insecurity    Worry: Never true    Inability: Never true  .  Transportation needs    Medical: No    Non-medical: No  Tobacco Use  . Smoking status: Former Smoker    Packs/day: 0.10    Years: 10.00    Pack years: 1.00    Types: Cigars    Quit date: 08/09/2014    Years since quitting: 4.0  . Smokeless tobacco: Never Used  Substance and Sexual Activity  . Alcohol use: No    Alcohol/week: 0.0 standard drinks  . Drug use: No  . Sexual activity: Not Currently    Partners: Male    Comment: pt. declined condoms  Lifestyle  . Physical activity    Days per week: 0 days    Minutes per session: 0 min  . Stress: Only a little  Relationships  . Social connections    Talks on phone: More than three times a week    Gets together: Once a week    Attends religious service: Never    Active member of club or organization: No    Attends meetings of clubs or organizations: Not on file    Relationship status: Never married  . Intimate partner violence    Fear of current or ex partner: No    Emotionally abused: No    Physically abused: No    Forced sexual activity: No  Other Topics Concern  . Not on file  Social History Narrative   Epworth Sleepiness Scale = 7 (as of 03/16/2015)    Current Outpatient Medications on File Prior to Visit  Medication Sig Dispense Refill  . acetaminophen (TYLENOL) 500 MG tablet Take 1,000-1,500 mg by mouth every 8 (eight) hours as needed for moderate pain.     [name] ALPRAZolam (XANAX) 1 MG tablet Take 1 mg by mouth 4 (four) times daily as needed for anxiety.     [name] amLODipine (NORVASC) 5 MG tablet Take 1 tablet (5 mg total) by mouth daily. 90 tablet 3  . amphetamine-dextroamphetamine (ADDERALL) 30 MG tablet Take 30 mg by mouth 3 (three) times daily.    . Brexpiprazole (REXULTI) 1 MG TABS Take 1 mg by mouth at bedtime.    . calcium-vitamin D (OSCAL WITH D) 500-200 MG-UNIT tablet Take 1 tablet by mouth.    . Cholecalciferol (VITAMIN D3) 250 MCG (10000 UT) TABS Take by mouth.    . Continuous Blood Gluc Sensor (FREESTYLE LIBRE 14  DAY SENSOR) MISC AS DIRECTED EVERY 14 DAYS 6 each 3  . Cyanocobalamin (VITAMIN B-12) 5000 MCG LOZG Take by mouth.    . diclofenac sodium (VOLTAREN) 1 % GEL APPLY 2 GRAMS TOPICALLY TO EACH KNEE IN THE MORNING AND AT BEDTIME, APPLY 1 GRAM TO EACH KNEE IN THE AFTERNOON 300 g 0  . divalproex (DEPAKOTE ER) 500 MG 24 hr tablet Take 1 tablet (500 mg total) by mouth at bedtime. 90 tablet 1  . glucose blood (FREESTYLE PRECISION NEO TEST) test strip Used to check blood sugars twice daily. 100 each 12  . levothyroxine (SYNTHROID, LEVOTHROID) 50 MCG tablet Take 1 tablet (50 mcg total) by mouth at bedtime. 30 tablet 11  . mupirocin ointment (BACTROBAN) 2 % Place 1 application into the nose 2 (two) times daily. 22 g 3  . ondansetron (ZOFRAN) 8 MG tablet Take 8 mg by mouth every 8 (eight) hours as needed for nausea or vomiting.    . pantoprazole (PROTONIX) 40 MG tablet TAKE 1 TABLET(40 MG) BY MOUTH DAILY AT 6 AM 90 tablet 0  . polyethylene glycol (MIRALAX / GLYCOLAX) packet Take 17 g by mouth 2 (two) times daily. 30 each 0  . pregabalin (LYRICA) 150 MG capsule Take 1 capsule (150 mg total) by mouth 3 (three) times daily. 270 capsule 1  . protriptyline (VIVACTIL) 10 MG tablet Take 10 mg by mouth 3 (three) times daily.   11  . rivaroxaban (XARELTO) 20 MG TABS tablet TAKE 1 TABLET(20 MG) BY MOUTH DAILY 90 tablet 1  . tiZANidine (ZANAFLEX) 4 MG tablet Take 1 tablet (4 mg total) by mouth every 6 (six) hours as needed for muscle spasms. Do not refill in less than 30 days 20 tablet 6  . TRIUMEQ 600-50-300 MG tablet TAKE 1 TABLET BY MOUTH DAILY 30 tablet 4  . UNABLE TO FIND CPAP MACHINE with standard Aclaim nasal mask with humidifier. Set at 14 cwp (Patient taking differently: CPAP MACHINE with standard Aclaim nasal mask with humidifier. Set at 4 cwp) 1 each 0  . Vortioxetine HBr (TRINTELLIX PO) Take 25 mg by mouth at bedtime.     [name] zolpidem (AMBIEN) 10 MG tablet Take 10 mg by mouth at bedtime.     [name] ezetimibe (ZETIA) 10  MG tablet Take 1 tablet (10 mg total) by mouth daily. 90 tablet 3  . furosemide (LASIX) 40 MG tablet Take 1 tablet (40 mg total) by mouth daily. 30 tablet 5   No current facility-administered medications  on file prior to visit.     Allergies  Allergen Reactions  . Aspirin Swelling  . Ibuprofen Swelling  . Sustiva [Efavirenz] Swelling and Rash  . Nsaids Other (See Comments)    unknwn  . Lipitor [Atorvastatin Calcium] Other (See Comments)    Leg pain    Family History  Problem Relation Age of Onset  . Depression Brother   . Throat cancer Brother        half brother, never smoker  . COPD Mother   . Diabetes Neg Hx     BP (!) 168/82 (BP Location: Left Arm, Patient Position: Sitting, Cuff Size: Large)   Pulse (!) 105   Ht 5' 11"  (1.803 m)   Wt 274 lb 9.6 oz (124.6 kg)   SpO2 95%   BMI 38.30 kg/m   Review of Systems Denies LOC    Objective:   Physical Exam VITAL SIGNS:  See vs page GENERAL: no distress Pulses: left foot pulses are intact.    MSK: no deformity of the feet or ankles, except for right transmetatarsal amputation.   CV: 2+ bilat edema of the legs  Skin:  normal color and temp on the feet and ankles. 5 mm shallow ulcer at the dorsal aspect of the left foot.  No erythema.   Neuro: sensation is intact to touch on the feet and ankles, but severely decreased from normal.   There is onychomycosis of the left foot toenails.    Lab Results  Component Value Date   CREATININE 1.65 (H) 04/26/2018   BUN 19 04/26/2018   NA 143 04/26/2018   K 4.6 04/26/2018   CL 100 04/26/2018   CO2 24 04/26/2018   A1c=7.8%     Assessment & Plan:  HTN: is noted today Hypoglycemia: this limits aggressiveness of glycemic control Insulin-requiring type 2 DM, with PAD: The pattern of his cbg's indicates he needs some adjustment in his therapy   Patient Instructions  Your blood pressure is high today.  Please see your primary care provider soon, to have it rechecked check your  blood sugar twice a day.  vary the time of day when you check, between before the 3 meals, and at bedtime.  also check if you have symptoms of your blood sugar being too high or too low.  please keep a record of the readings and bring it to your next appointment here (or you can bring the meter itself).  You can write it on any piece of paper.  please call us sooner if your blood sugar goes below 70, or if you have a lot of readings over 200.   I have sent a prescription to your pharmacy, to increase the lantus to 270 units each morning, and: Please reduce the supper novolog to 20 units.   Please come back for a follow-up appointment in 2 months.

## 2018-09-08 DIAGNOSIS — H2511 Age-related nuclear cataract, right eye: Secondary | ICD-10-CM | POA: Diagnosis not present

## 2018-09-08 DIAGNOSIS — H25011 Cortical age-related cataract, right eye: Secondary | ICD-10-CM | POA: Diagnosis not present

## 2018-09-08 DIAGNOSIS — H25012 Cortical age-related cataract, left eye: Secondary | ICD-10-CM | POA: Diagnosis not present

## 2018-09-08 DIAGNOSIS — H2512 Age-related nuclear cataract, left eye: Secondary | ICD-10-CM | POA: Diagnosis not present

## 2018-09-09 ENCOUNTER — Other Ambulatory Visit: Payer: Self-pay | Admitting: Endocrinology

## 2018-09-10 ENCOUNTER — Other Ambulatory Visit: Payer: Self-pay | Admitting: Family Medicine

## 2018-09-10 DIAGNOSIS — Z8669 Personal history of other diseases of the nervous system and sense organs: Secondary | ICD-10-CM

## 2018-09-10 NOTE — Telephone Encounter (Signed)
Requested medication (s) are due for refill today: Yes  Requested medication (s) are on the active medication list: Yes  Last refill:  03/24/18  Future visit scheduled: No  Notes to clinic:  See request    Requested Prescriptions  Pending Prescriptions Disp Refills   divalproex (DEPAKOTE ER) 500 MG 24 hr tablet [Pharmacy Med Name: DIVALPROEX EXTENDED RELEASE 500MG T] 90 tablet 1    Sig: TAKE 1 TABLET(500 MG) BY MOUTH AT BEDTIME     Not Delegated - Neurology:  Anticonvulsants - Valproates Failed - 09/10/2018  1:19 PM      Failed - This refill cannot be delegated      Failed - AST in normal range and within 360 days    AST  Date Value Ref Range Status  04/26/2018 52 (H) 0 - 40 IU/L Final         Failed - ALT in normal range and within 360 days    ALT  Date Value Ref Range Status  04/26/2018 64 (H) 0 - 44 IU/L Final         Failed - Valproic Acid (serum) in normal range and within 360 days    Valproic Acid Lvl  Date Value Ref Range Status  10/16/2017 18 (L) 50.0 - 100.0 ug/mL Final    Comment:    Performed at [facility], [address] [place], [place] [address]         Passed - HGB in normal range and within 360 days    Hemoglobin  Date Value Ref Range Status  03/24/2018 16.7 13.0 - 17.7 g/dL Final   HGB  Date Value Ref Range Status  05/19/2011 17.1 13.0 - 17.1 g/dL Final         Passed - PLT in normal range and within 360 days    Platelets  Date Value Ref Range Status  03/24/2018 169 150 - 450 x10E3/uL Final   Platelet Count, POC  Date Value Ref Range Status  11/26/2015 161 142 - 424 K/uL Final         Passed - WBC in normal range and within 360 days    WBC  Date Value Ref Range Status  03/24/2018 7.6 3.4 - 10.8 x10E3/uL Final  11/04/2017 8.6 3.8 - 10.8 Thousand/uL Final         Passed - HCT in normal range and within 360 days    HCT  Date Value Ref Range Status  05/19/2011 49.1 38.4 - 49.9 % Final   Hematocrit  Date Value Ref Range Status   03/24/2018 47.3 37.5 - 51.0 % Final         Passed - Valid encounter within last 12 months    Recent Outpatient Visits          5 months ago Chronic anticoagulation   [facility], Ranell Patrick, MD   9 months ago History of DVT (deep vein thrombosis)   [facility], Ranell Patrick, MD   1 year ago Weakness on right side of face   [facility], Ranell Patrick, MD   1 year ago Folliculitis   [facility], Ranell Patrick, MD   1 year ago Pain in both knees, unspecified chronicity   [facility], Ranell Patrick, MD      Future Appointments            In 2 months Skeet Latch, MD [facility] [place], [facility]  In 4 months Leandrew Koyanagi, MD [facility]

## 2018-09-15 ENCOUNTER — Other Ambulatory Visit: Payer: Self-pay | Admitting: Cardiovascular Disease

## 2018-09-15 ENCOUNTER — Other Ambulatory Visit: Payer: Self-pay | Admitting: Neurology

## 2018-09-15 DIAGNOSIS — H25012 Cortical age-related cataract, left eye: Secondary | ICD-10-CM | POA: Diagnosis not present

## 2018-09-15 DIAGNOSIS — H2512 Age-related nuclear cataract, left eye: Secondary | ICD-10-CM | POA: Diagnosis not present

## 2018-09-15 NOTE — Telephone Encounter (Signed)
Morey Hummingbird w/[facility] [phone] is calling checking on the status of the patient's divalproex (DEPAKOTE ER) 500 MG 24 hr tablet  medication request.  Medication was requested 09/10/2018.

## 2018-09-20 ENCOUNTER — Encounter: Payer: Self-pay | Admitting: Internal Medicine

## 2018-09-23 ENCOUNTER — Ambulatory Visit: Payer: PPO | Admitting: Family Medicine

## 2018-10-04 ENCOUNTER — Encounter: Payer: Self-pay | Admitting: Family Medicine

## 2018-10-04 ENCOUNTER — Ambulatory Visit (INDEPENDENT_AMBULATORY_CARE_PROVIDER_SITE_OTHER): Payer: PPO | Admitting: Family Medicine

## 2018-10-04 ENCOUNTER — Other Ambulatory Visit: Payer: Self-pay | Admitting: Family Medicine

## 2018-10-04 ENCOUNTER — Other Ambulatory Visit: Payer: Self-pay

## 2018-10-04 VITALS — BP 126/72 | HR 62 | Temp 98.5°F | Resp 16 | Wt 268.4 lb

## 2018-10-04 DIAGNOSIS — R609 Edema, unspecified: Secondary | ICD-10-CM | POA: Diagnosis not present

## 2018-10-04 DIAGNOSIS — Z5181 Encounter for therapeutic drug level monitoring: Secondary | ICD-10-CM

## 2018-10-04 DIAGNOSIS — Z86718 Personal history of other venous thrombosis and embolism: Secondary | ICD-10-CM

## 2018-10-04 DIAGNOSIS — E1142 Type 2 diabetes mellitus with diabetic polyneuropathy: Secondary | ICD-10-CM | POA: Diagnosis not present

## 2018-10-04 DIAGNOSIS — I82409 Acute embolism and thrombosis of unspecified deep veins of unspecified lower extremity: Secondary | ICD-10-CM

## 2018-10-04 DIAGNOSIS — Z7901 Long term (current) use of anticoagulants: Secondary | ICD-10-CM | POA: Diagnosis not present

## 2018-10-04 DIAGNOSIS — Z8669 Personal history of other diseases of the nervous system and sense organs: Secondary | ICD-10-CM | POA: Diagnosis not present

## 2018-10-04 DIAGNOSIS — K219 Gastro-esophageal reflux disease without esophagitis: Secondary | ICD-10-CM | POA: Diagnosis not present

## 2018-10-04 MED ORDER — PREGABALIN 150 MG PO CAPS: 150.0000 mg | ORAL_CAPSULE | Freq: Three times a day (TID) | ORAL | 1 refills | Status: DC

## 2018-10-04 MED ORDER — RIVAROXABAN 20 MG PO TABS: ORAL_TABLET | ORAL | 1 refills | Status: DC

## 2018-10-04 MED ORDER — DIVALPROEX SODIUM ER 500 MG PO TB24: ORAL_TABLET | ORAL | 1 refills | Status: DC

## 2018-10-04 MED ORDER — PANTOPRAZOLE SODIUM 40 MG PO TBEC: DELAYED_RELEASE_TABLET | ORAL | 1 refills | Status: DC

## 2018-10-04 NOTE — Progress Notes (Signed)
Subjective:    Patient ID: Daniel [name], male    DOB: January 30, 1956, 63 y.o.   MRN: [id number]  HPI Daniel [name] is a 63 y.o. male Presents today for: Chief Complaint  Patient presents with   chronic medical condition    Here for his f/u   Care team: Infectious disease, [name] Ophthalmology, Dr. Gershon Crane - cataracts removed a month ago.  Orthopedics, [name] Endocrinology, [name] Cardiology, Dr. [name] Gastroenterology, Dr. Oletta Lamas Neuro:  [name] Sleep: [facility]: [name].  PCP is me. Last visit with me in January.  History of multiple medical problems per problem list including HIV,  diabetes with diabetic peripheral neuropathy, chronic knee pain, status post injection bilateral knees July 14 at orthopedics.  Status post transmetatarsal amputation of right foot last August after osteomyelitis. appt in next few weeks with [name] - "so-so relief of knee pain" with steroid injection.   Seen June 30 by cardiology, history of near syncope, echocardiogram, carotid Dopplers, Myoview and heart monitoring were essentially unremarkable per their notes.  Elevated LFTs likely due to fatty liver but has been off statin temporarily.  LDL in March was 113 on Zetia. Lab Results  Component Value Date   CHOL 201 (H) 04/26/2018   HDL 39 (L) 04/26/2018   LDLCALC 113 (H) 04/26/2018   TRIG 247 (H) 04/26/2018   CHOLHDL 5.2 (H) 04/26/2018   Stage III CKD - appt with nephrology in 11/2017. recheck 1 year.  Creat range of 1.5-1.7.  Lab Results  Component Value Date   CREATININE 1.65 (H) 04/26/2018   Followed by [name] for history of HIV.  Seen in March with plan on 63-monthfollow-up.  History of diabetic peripheral neuropathy, on lyrica.  ultimately settling on 450 mg/day for improved symptoms. Peripheral edema noted in January, compression stockings were discussed but also started on lasix from cardiology.  We have discussed the increased risk for adverse drug  reactions at higher lyrica  dose and potential peripheral edema, but he had not noticed any increased edema with higher dosing of Lyrica from 400 mg total per day to 450 mg.  Neurology eval in November 2019, no change in Lyrica at that time.  We have also discussed follow-up with neurology if other recommendations needed for peripheral neuropathic pain treatment.   Peripheral neuropathy pain has improved on 1568mtid lyrica.  Has not used compression stockings in awhile.  Lasix may have helped some with swelling. On 4069mer day.   History of migraine headaches: Discussed in January, he was continued on Depakote 500 mg daily for migraine prevention. Last migraine unknown - depakote has been working well as preventative.   DVT, chronic anticoagulation. Taking Xarelto 20 mg daily. No new bleeding. No [place]/tarry stools or other new bleeding.   Acid reflux: Remote history of ulcer as a teenager, nothing recent.  Denies any dark stool or bleeding as above.  Takes Protonix once per day with control of acid reflux symptoms.   Patient Active Problem List   Diagnosis Date Noted   Anticoagulated 08/24/2018   Dyslipidemia 07/27/2018   Essential hypertension 06/25/2018   Hypogonadism in male 05/10/2018   Numbness 03/31/2018   Diabetic peripheral neuropathy ([place]2/03/2017   History of syncope 01/25/2018   Vaccine counseling 11/16/2017   S/P transmetatarsal amputation of foot, right ([place]CC[place]8/16/2019   Osteomyelitis ([name])    Hypothyroidism    Constipation    History of DVT (deep vein thrombosis) 09/30/2017   Morbid  obesity due to excess calories ([place]) complicated by DM / hyperlipidemia 01/15/2017   DOE (dyspnea on exertion) 01/13/2017   Chronic migraine 09/17/2016   Gait abnormality 09/17/2016   Diabetic foot infection ([place]) 06/28/2016   Fall 12/05/2015   Toe ulcer, right ([place]) 09/19/2015   Decreased pedal pulses 09/19/2015   OSA on CPAP 09/05/2015   Major  depressive disorder, recurrent episode, moderate ([place]) 09/05/2015   Low back pain 06/11/2015   Abnormality of gait 06/11/2015   Dizziness 03/17/2015   Weakness 02/21/2015   Chronic renal insufficiency, stage III (moderate) ([place]) 08/09/2014   Insulin-requiring or dependent type II diabetes mellitus ([place]) 11/07/2013   Hematuria 06/21/2013   Hepatic steatosis 09/09/2010   Human immunodeficiency virus (HIV) disease ([place]) 06/04/2006   HERPES ZOSTER, UNCOMPLICATED 97/58/8325   THROMBOPHLEBITIS NOS 06/04/2006   GERD 06/04/2006   ARTHRITIS, HAND 06/04/2006   Past Medical History:  Diagnosis Date   ADHD (attention deficit hyperactivity disorder)    Anxiety    Chronic kidney disease    Clotting disorder ([place])    Depression    Diabetes mellitus without complication ([place])    Diabetes mellitus, type II ([place])    Dizziness 03/17/2015   Essential hypertension 06/25/2018   GERD (gastroesophageal reflux disease)    HIV disease ([name])    HIV infection ([place])    Liver disease    OSA (obstructive sleep apnea) 07/25/2015   Uses CPAP regularly   Peripheral vascular disease ([place])    Ulcer    Past Surgical History:  Procedure Laterality Date   AMPUTATION Right 10/02/2017   Procedure: RIGHT TRANSMETATARSAL AMPUTATION;  Surgeon: Leandrew Koyanagi, MD;  Location: [facility];  Service: Orthopedics;  Laterality: Right;   SMALL INTESTINE SURGERY     STOMACH SURGERY     TOE AMPUTATION Right 08/2016   right great toe   Allergies  Allergen Reactions   Aspirin Swelling   Ibuprofen Swelling   Sustiva [Efavirenz] Swelling and Rash   Nsaids Other (See Comments)    unknwn   Lipitor [Atorvastatin Calcium] Other (See Comments)    Leg pain   Prior to Admission medications   Medication Sig Start Date End Date Taking? Authorizing Provider  acetaminophen (TYLENOL) 500 MG tablet Take 1,000-1,500 mg by mouth every 8 (eight) hours as needed for moderate pain.    Yes [provider]  ALPRAZolam Duanne Moron) 1 MG tablet Take 1 mg by mouth 4 (four) times daily as needed for anxiety.    Yes [provider]  amphetamine-dextroamphetamine (ADDERALL) 30 MG tablet Take 30 mg by mouth 3 (three) times daily.   Yes [provider]  B-D ULTRAFINE III SHORT PEN 31G X 8 MM MISC USE AS DIRECTED TO INJECT LANTUS AND NOVOLOG DAILY 09/09/18  Yes Renato Shin, MD  Brexpiprazole (REXULTI) 1 MG TABS Take 1 mg by mouth at bedtime.   Yes [provider]  calcium-vitamin D (OSCAL WITH D) 500-200 MG-UNIT tablet Take 1 tablet by mouth.   Yes [provider]  Cholecalciferol (VITAMIN D3) 250 MCG (10000 UT) TABS Take by mouth.   Yes [provider]  Continuous Blood Gluc Sensor (FREESTYLE LIBRE 14 DAY SENSOR) MISC AS DIRECTED EVERY 14 DAYS 06/22/18  Yes Renato Shin, MD  Cyanocobalamin (VITAMIN B-12) 5000 MCG LOZG Take by mouth.   Yes [provider]  diclofenac sodium (VOLTAREN) 1 % GEL APPLY 2 GRAMS TOPICALLY TO EACH KNEE IN THE MORNING AND AT BEDTIME, APPLY 1 GRAM TO EACH KNEE  IN THE AFTERNOON 05/29/18  Yes [name], MD  divalproex (DEPAKOTE ER) 500 MG 24 hr tablet TAKE 1 TABLET(500 MG) BY MOUTH AT BEDTIME 09/15/18  Yes [name], MD  furosemide (LASIX) 40 MG tablet TAKE 1 TABLET(40 MG) BY MOUTH DAILY 09/16/18  Yes Skeet Latch, MD  glucose blood (FREESTYLE PRECISION NEO TEST) test strip Used to check blood sugars twice daily. 07/28/17  Yes Renato Shin, MD  insulin aspart (NOVOLOG FLEXPEN) 100 UNIT/ML FlexPen 10 units with breakfast, and 20 units with evening meal 09/07/18  Yes Renato Shin, MD  Insulin Glargine (LANTUS SOLOSTAR) 100 UNIT/ML Solostar Pen Inject 270 Units into the skin every morning. 09/07/18  Yes Renato Shin, MD  levothyroxine (SYNTHROID, LEVOTHROID) 50 MCG tablet Take 1 tablet (50 mcg total) by mouth at bedtime. 07/24/15  Yes [name], MD  mupirocin ointment (BACTROBAN) 2 % Place 1 application into the nose 2  (two) times daily. 06/10/18  Yes Leandrew Koyanagi, MD  ondansetron (ZOFRAN) 8 MG tablet Take 8 mg by mouth every 8 (eight) hours as needed for nausea or vomiting.   Yes [provider]  pantoprazole (PROTONIX) 40 MG tablet TAKE 1 TABLET(40 MG) BY MOUTH DAILY AT 6 AM 07/18/18  Yes [name], MD  polyethylene glycol [facility] / GLYCOLAX) packet Take 17 g by mouth 2 (two) times daily. 10/05/17  Yes Eugenie Filler, MD  pregabalin (LYRICA) 150 MG capsule Take 1 capsule (150 mg total) by mouth 3 (three) times daily. 03/24/18  Yes [name], MD  protriptyline (VIVACTIL) 10 MG tablet Take 10 mg by mouth 3 (three) times daily.  01/30/16  Yes [provider]  tiZANidine (ZANAFLEX) 4 MG tablet TAKE 1 TABLET(4 MG) BY MOUTH EVERY 6 HOURS AS NEEDED FOR MUSCLE SPASMS. DO NOT REFILL IN LESS THAN 30 DAYS 09/15/18  Yes Marcial Pacas, MD  TRIUMEQ 600-50-300 MG tablet TAKE 1 TABLET BY MOUTH DAILY 05/31/18  Yes Comer, Okey Regal, MD  UNABLE TO FIND CPAP MACHINE with standard Aclaim nasal mask with humidifier. Set at 14 cwp Patient taking differently: CPAP MACHINE with standard Aclaim nasal mask with humidifier. Set at 4 cwp 04/12/13  Yes Daub, Loura Back, MD  Vortioxetine HBr (TRINTELLIX PO) Take 25 mg by mouth at bedtime.  01/26/15  Yes [provider]  XARELTO 20 MG TABS tablet TAKE 1 TABLET(20 MG) BY MOUTH DAILY 10/04/18  Yes [name], MD  zolpidem (AMBIEN) 10 MG tablet Take 10 mg by mouth at bedtime.    Yes [provider]  amLODipine (NORVASC) 5 MG tablet Take 1 tablet (5 mg total) by mouth daily. 06/24/18 09/22/18  Skeet Latch, MD  ezetimibe (ZETIA) 10 MG tablet Take 1 tablet (10 mg total) by mouth daily. 04/12/18 07/11/18  Skeet Latch, MD   Social History   Socioeconomic History   Marital status: Single    Spouse name: Not on file   Number of children: Not on file   Years of education: Not on file   Highest education level: Not on file  Occupational  History    Comment: DISABILITY  Social Needs   Financial resource strain: Not hard at all   Food insecurity    Worry: Never true    Inability: Never true   Transportation needs    Medical: No    Non-medical: No  Tobacco Use   Smoking status: Former Smoker    Packs/day: 0.10    Years: 10.00  Pack years: 1.00    Types: Cigars    Quit date: 08/09/2014    Years since quitting: 4.1   Smokeless tobacco: Never Used  Substance and Sexual Activity   Alcohol use: No    Alcohol/week: 0.0 standard drinks   Drug use: No   Sexual activity: Not Currently    Partners: Male    Comment: pt. declined condoms  Lifestyle   Physical activity    Days per week: 0 days    Minutes per session: 0 min   Stress: Only a little  Relationships   Social connections    Talks on phone: More than three times a week    Gets together: Once a week    Attends religious service: Never    Active member of club or organization: No    Attends meetings of clubs or organizations: Not on file    Relationship status: Never married   Intimate partner violence    Fear of current or ex partner: No    Emotionally abused: No    Physically abused: No    Forced sexual activity: No  Other Topics Concern   Not on file  Social History Narrative   Epworth Sleepiness Scale = 7 (as of 03/16/2015)    Review of Systems Per HPI.     Objective:   Physical Exam Vitals signs reviewed.  Constitutional:      Appearance: He is well-developed.  HENT:     Head: Normocephalic and atraumatic.  Eyes:     Pupils: Pupils are equal, round, and reactive to light.  Neck:     Vascular: No carotid bruit or JVD.  Cardiovascular:     Rate and Rhythm: Normal rate and regular rhythm.     Heart sounds: Normal heart sounds. No murmur.  Pulmonary:     Effort: Pulmonary effort is normal.     Breath sounds: Normal breath sounds. No rales.  Musculoskeletal:     Right lower leg: Edema (2+ mid tibia. ) present.     Left  lower leg: Edema present.  Skin:    General: Skin is warm and dry.  Neurological:     Mental Status: He is alert and oriented to person, place, and time.    Vitals:   10/04/18 1641  BP: 126/72  Pulse: 62  Resp: 16  Temp: 98.5 F (36.9 C)  TempSrc: Oral  SpO2: 96%  Weight: 268 lb 6.4 oz (121.7 kg)        Assessment & Plan:   Daniel [name] is a 63 y.o. male History of DVT (deep vein thrombosis) Deep vein thrombosis (DVT) ([name]) - Plan: rivaroxaban (XARELTO) 20 MG TABS tablet Medication monitoring encounter - Plan: Comprehensive metabolic panel, Protime-INR, CBC Chronic anticoagulation  -Denies new symptoms, or bleeding.  Tolerating Xarelto at current dose.  Check PT/INR at lab only visit.  Continue same dose  Diabetic peripheral neuropathy ([place]) - Plan: pregabalin (LYRICA) 150 MG capsule  -Improved symptoms at 150 mg 3 times daily.  Option of lower dosing discussed which may also lessen peripheral edema but declined at present.  We will continue same dosing for now with recheck 6 months  History of migraine - Plan: divalproex (DEPAKOTE ER) 500 MG 24 hr tablet  -Stable, no recent migraine, tolerating Depakote.  Monitoring labs as above  Peripheral edema  -Improved.  Compressive stockings as needed, continued on Lasix, routine follow-up with cardiology.  Gastroesophageal reflux disease, esophagitis presence not specified - Plan: pantoprazole (PROTONIX) 40 MG  tablet  -Stable on Protonix, continue same.  Meds ordered this encounter  Medications   pantoprazole (PROTONIX) 40 MG tablet    Sig: TAKE 1 TABLET(40 MG) BY MOUTH DAILY AT 6 AM    Dispense:  90 tablet    Refill:  1   rivaroxaban (XARELTO) 20 MG TABS tablet    Sig: TAKE 1 TABLET(20 MG) BY MOUTH DAILY    Dispense:  90 tablet    Refill:  1   pregabalin (LYRICA) 150 MG capsule    Sig: Take 1 capsule (150 mg total) by mouth 3 (three) times daily.    Dispense:  270 capsule    Refill:  1   divalproex (DEPAKOTE ER)  500 MG 24 hr tablet    Sig: TAKE 1 TABLET(500 MG) BY MOUTH AT BEDTIME    Dispense:  90 tablet    Refill:  1   Patient Instructions   Continue same medications for now.   Monitoring labs were ordered to have drawn here as lab only visit.   As we discussed peripheral edema can be due in part to your Lyrica, so if neuropathy symptoms are improved can look at lower dosing in the future.  Okay to continue same dose for now.  I would also recommend using compression stockings for peripheral edema.  Keep follow-up as planned with your other specialists.  Follow-up in 6 months, but let me know if there are questions in the meantime.   Return to the clinic or go to the nearest emergency room if any of your symptoms worsen or new symptoms occur.  If you have lab work done today you will be contacted with your lab results within the next 2 weeks.  If you have not heard from [name] then please contact us. The fastest way to get your results is to register for My Chart.   IF you received an x-ray today, you will receive an invoice from [facility]. Please contact [facility] at [phone] with questions or concerns regarding your invoice.   IF you received labwork today, you will receive an invoice from [facility]. Please contact [facility] at [phone] with questions or concerns regarding your invoice.   Our billing staff will not be able to assist you with questions regarding bills from these companies.  You will be contacted with the lab results as soon as they are available. The fastest way to get your results is to activate your My Chart account. Instructions are located on the last page of this paperwork. If you have not heard from [name] regarding the results in 2 weeks, please contact this office.       Signed,   [name], MD [facility].  10/05/18 11:57 AM

## 2018-10-04 NOTE — Patient Instructions (Addendum)
Continue same medications for now.   Monitoring labs were ordered to have drawn here as lab only visit.   As we discussed peripheral edema can be due in part to your Lyrica, so if neuropathy symptoms are improved can look at lower dosing in the future.  Okay to continue same dose for now.  I would also recommend using compression stockings for peripheral edema.  Keep follow-up as planned with your other specialists.  Follow-up in 6 months, but let me know if there are questions in the meantime.   Return to the clinic or go to the nearest emergency room if any of your symptoms worsen or new symptoms occur.  If you have lab work done today you will be contacted with your lab results within the next 2 weeks.  If you have not heard from [name] then please contact us. The fastest way to get your results is to register for My Chart.   IF you received an x-ray today, you will receive an invoice from [facility]. Please contact [facility] at [phone] with questions or concerns regarding your invoice.   IF you received labwork today, you will receive an invoice from [facility]. Please contact [facility] at [phone] with questions or concerns regarding your invoice.   Our billing staff will not be able to assist you with questions regarding bills from these companies.  You will be contacted with the lab results as soon as they are available. The fastest way to get your results is to activate your My Chart account. Instructions are located on the last page of this paperwork. If you have not heard from [name] regarding the results in 2 weeks, please contact this office.

## 2018-10-04 NOTE — Telephone Encounter (Signed)
Requested Prescriptions  Pending Prescriptions Disp Refills  . XARELTO 20 MG TABS tablet [Pharmacy Med Name: XARELTO 20MG TABLETS] 90 tablet 0    Sig: TAKE 1 TABLET(20 MG) BY MOUTH DAILY     Hematology: Anticoagulants - rivaroxaban Failed - 10/04/2018  1:45 PM      Failed - ALT in normal range and within 180 days    ALT  Date Value Ref Range Status  04/26/2018 64 (H) 0 - 44 IU/L Final         Failed - AST in normal range and within 180 days    AST  Date Value Ref Range Status  04/26/2018 52 (H) 0 - 40 IU/L Final         Failed - Cr in normal range and within 360 days    Creat  Date Value Ref Range Status  11/04/2017 1.51 (H) 0.70 - 1.25 mg/dL Final    Comment:    For patients >52 years of age, the reference limit for Creatinine is approximately 13% higher for people identified as African-American. .    Creatinine, Ser  Date Value Ref Range Status  04/26/2018 1.65 (H) 0.76 - 1.27 mg/dL Final         Passed - HCT in normal range and within 360 days    HCT  Date Value Ref Range Status  05/19/2011 49.1 38.4 - 49.9 % Final   Hematocrit  Date Value Ref Range Status  03/24/2018 47.3 37.5 - 51.0 % Final         Passed - HGB in normal range and within 360 days    Hemoglobin  Date Value Ref Range Status  03/24/2018 16.7 13.0 - 17.7 g/dL Final   HGB  Date Value Ref Range Status  05/19/2011 17.1 13.0 - 17.1 g/dL Final         Passed - PLT in normal range and within 360 days    Platelets  Date Value Ref Range Status  03/24/2018 169 150 - 450 x10E3/uL Final   Platelet Count, POC  Date Value Ref Range Status  11/26/2015 161 142 - 424 K/uL Final         Passed - Valid encounter within last 12 months    Recent Outpatient Visits          6 months ago Chronic anticoagulation   [facility], MD   10 months ago History of DVT (deep vein thrombosis)   [facility] [place], Ranell Patrick, MD   1 year ago Weakness on right side of face    [facility] [place], Ranell Patrick, MD   1 year ago Folliculitis   [facility] [place], Ranell Patrick, MD   1 year ago Pain in both knees, unspecified chronicity   [facility] [place], Ranell Patrick, MD      Future Appointments            Today [name], MD [facility] [place], [facility]   In 1 month Skeet Latch, [place], [name]   In 3 months Leandrew Koyanagi, MD [facility]

## 2018-10-05 ENCOUNTER — Encounter: Payer: Self-pay | Admitting: Family Medicine

## 2018-10-15 ENCOUNTER — Other Ambulatory Visit: Payer: Self-pay | Admitting: Internal Medicine

## 2018-10-15 ENCOUNTER — Other Ambulatory Visit: Payer: Self-pay | Admitting: Endocrinology

## 2018-10-15 ENCOUNTER — Other Ambulatory Visit: Payer: Self-pay

## 2018-10-15 DIAGNOSIS — B20 Human immunodeficiency virus [HIV] disease: Secondary | ICD-10-CM

## 2018-10-15 MED ORDER — TRIUMEQ 600-50-300 MG PO TABS: 1.0000 | ORAL_TABLET | Freq: Every day | ORAL | 4 refills | Status: DC

## 2018-10-15 NOTE — Telephone Encounter (Signed)
Patient should call the office regarding the Zofran.  I am not sure if it should be continued.  Will need to check with provider.

## 2018-11-04 ENCOUNTER — Other Ambulatory Visit: Payer: Self-pay

## 2018-11-05 DIAGNOSIS — G4733 Obstructive sleep apnea (adult) (pediatric): Secondary | ICD-10-CM | POA: Diagnosis not present

## 2018-11-08 ENCOUNTER — Other Ambulatory Visit: Payer: Self-pay

## 2018-11-08 ENCOUNTER — Ambulatory Visit (INDEPENDENT_AMBULATORY_CARE_PROVIDER_SITE_OTHER): Payer: PPO | Admitting: Endocrinology

## 2018-11-08 ENCOUNTER — Other Ambulatory Visit: Payer: PPO

## 2018-11-08 ENCOUNTER — Encounter: Payer: Self-pay | Admitting: Endocrinology

## 2018-11-08 VITALS — BP 134/70 | HR 90 | Ht 71.0 in | Wt 270.8 lb

## 2018-11-08 DIAGNOSIS — Z794 Long term (current) use of insulin: Secondary | ICD-10-CM

## 2018-11-08 DIAGNOSIS — E1122 Type 2 diabetes mellitus with diabetic chronic kidney disease: Secondary | ICD-10-CM | POA: Diagnosis not present

## 2018-11-08 DIAGNOSIS — N183 Chronic kidney disease, stage 3 unspecified: Secondary | ICD-10-CM

## 2018-11-08 DIAGNOSIS — N189 Chronic kidney disease, unspecified: Secondary | ICD-10-CM | POA: Diagnosis not present

## 2018-11-08 DIAGNOSIS — E0849 Diabetes mellitus due to underlying condition with other diabetic neurological complication: Secondary | ICD-10-CM

## 2018-11-08 LAB — POCT GLYCOSYLATED HEMOGLOBIN (HGB A1C): Hemoglobin A1C: 7.7 % — AB (ref 4.0–5.6)

## 2018-11-08 MED ORDER — LANTUS SOLOSTAR 100 UNIT/ML ~~LOC~~ SOPN: 260.0000 [IU] | PEN_INJECTOR | SUBCUTANEOUS | 99 refills | Status: DC

## 2018-11-08 MED ORDER — NOVOLOG FLEXPEN 100 UNIT/ML ~~LOC~~ SOPN: PEN_INJECTOR | SUBCUTANEOUS | 11 refills | Status: DC

## 2018-11-08 NOTE — Patient Instructions (Addendum)
Please change the insulin to the numbers listed below.   check your blood sugar twice a day.  vary the time of day when you check, between before the 3 meals, and at bedtime.  also check if you have symptoms of your blood sugar being too high or too low.  please keep a record of the readings and bring it to your next appointment here (or you can bring the meter itself).  You can write it on any piece of paper.  please call us sooner if your blood sugar goes below 70, or if you have a lot of readings over 200.   Please come back for a follow-up appointment in 2 months.

## 2018-11-08 NOTE — Progress Notes (Signed)
Subjective:    Patient ID: Daniel [name], male    DOB: Dec 03, 1955, 63 y.o.   MRN: [id number]  HPI Pt returns for f/u of diabetes mellitus: DM type: Insulin-requiring type 2.  Dx'ed: 1287 Complications: polyneuropathy, PAD, renal insufficiency, and foot ulcers.   Therapy: insulin since 2016. DKA: never Severe hypoglycemia: never.  Pancreatitis: never.   Other: he takes multiple daily injections, but basal insulin is emphasized, with improved results; he is retired.   Interval history: no cbg record, but states cbg's vary from 140-250.  He seldom has hypoglycemia, and these episodes are mild.  This usually happens fasting.  pt states he seldom misses the insulin.  pt states he feels well in general, except for fatigue.   Past Medical History:  Diagnosis Date  . ADHD (attention deficit hyperactivity disorder)   . Anxiety   . Chronic kidney disease   . Clotting disorder ([place])   . Depression   . Diabetes mellitus without complication ([place])   . Diabetes mellitus, type II ([place])   . Dizziness 03/17/2015  . Essential hypertension 06/25/2018  . GERD (gastroesophageal reflux disease)   . HIV disease ([place])   . HIV infection ([place])   . Liver disease   . OSA (obstructive sleep apnea) 07/25/2015   Uses CPAP regularly  . Peripheral vascular disease ([place])   . Ulcer     Past Surgical History:  Procedure Laterality Date  . AMPUTATION Right 10/02/2017   Procedure: RIGHT TRANSMETATARSAL AMPUTATION;  Surgeon: Leandrew Koyanagi, MD;  Location: [facility];  Service: Orthopedics;  Laterality: Right;  . SMALL INTESTINE SURGERY    . STOMACH SURGERY    . TOE AMPUTATION Right 08/2016   right great toe    Social History   Socioeconomic History  . Marital status: Single    Spouse name: Not on file  . Number of children: Not on file  . Years of education: Not on file  . Highest education level: Not on file  Occupational History    Comment: DISABILITY  Social Needs  . Financial resource strain: Not hard  at all  . Food insecurity    Worry: Never true    Inability: Never true  . Transportation needs    Medical: No    Non-medical: No  Tobacco Use  . Smoking status: Former Smoker    Packs/day: 0.10    Years: 10.00    Pack years: 1.00    Types: Cigars    Quit date: 08/09/2014    Years since quitting: 4.2  . Smokeless tobacco: Never Used  Substance and Sexual Activity  . Alcohol use: No    Alcohol/week: 0.0 standard drinks  . Drug use: No  . Sexual activity: Not Currently    Partners: Male    Comment: pt. declined condoms  Lifestyle  . Physical activity    Days per week: 0 days    Minutes per session: 0 min  . Stress: Only a little  Relationships  . Social connections    Talks on phone: More than three times a week    Gets together: Once a week    Attends religious service: Never    Active member of club or organization: No    Attends meetings of clubs or organizations: Not on file    Relationship status: Never married  . Intimate partner violence    Fear of current or ex partner: No    Emotionally abused: No    Physically abused: No  Forced sexual activity: No  Other Topics Concern  . Not on file  Social History Narrative   Epworth Sleepiness Scale = 7 (as of 03/16/2015)    Current Outpatient Medications on File Prior to Visit  Medication Sig Dispense Refill  . abacavir-dolutegravir-lamiVUDine (TRIUMEQ) 600-50-300 MG tablet Take 1 tablet by mouth daily. 30 tablet 4  . acetaminophen (TYLENOL) 500 MG tablet Take 1,000-1,500 mg by mouth every 8 (eight) hours as needed for moderate pain.     [name] ALPRAZolam (XANAX) 1 MG tablet Take 1 mg by mouth 4 (four) times daily as needed for anxiety.     [name] amphetamine-dextroamphetamine (ADDERALL) 30 MG tablet Take 30 mg by mouth 3 (three) times daily.    . B-D ULTRAFINE III SHORT PEN 31G X 8 MM MISC USE AS DIRECTED TO INJECT LANTUS AND NOVOLOG DAILY 100 each 0  . Brexpiprazole (REXULTI) 1 MG TABS Take 1 mg by mouth at bedtime.    .  calcium-vitamin D (OSCAL WITH D) 500-200 MG-UNIT tablet Take 1 tablet by mouth.    . Cholecalciferol (VITAMIN D3) 250 MCG (10000 UT) TABS Take by mouth.    . Continuous Blood Gluc Sensor (FREESTYLE LIBRE 14 DAY SENSOR) MISC AS DIRECTED EVERY 14 DAYS 6 each 3  . diclofenac sodium (VOLTAREN) 1 % GEL APPLY 2 GRAMS TOPICALLY TO EACH KNEE IN THE MORNING AND AT BEDTIME, APPLY 1 GRAM TO EACH KNEE IN THE AFTERNOON 300 g 0  . divalproex (DEPAKOTE ER) 500 MG 24 hr tablet TAKE 1 TABLET(500 MG) BY MOUTH AT BEDTIME 90 tablet 1  . furosemide (LASIX) 40 MG tablet TAKE 1 TABLET(40 MG) BY MOUTH DAILY 30 tablet 2  . glucose blood (FREESTYLE PRECISION NEO TEST) test strip Used to check blood sugars twice daily. 100 each 12  . levothyroxine (SYNTHROID, LEVOTHROID) 50 MCG tablet Take 1 tablet (50 mcg total) by mouth at bedtime. 30 tablet 11  . mupirocin ointment (BACTROBAN) 2 % Place 1 application into the nose 2 (two) times daily. 22 g 3  . ondansetron (ZOFRAN) 8 MG tablet Take 8 mg by mouth every 8 (eight) hours as needed for nausea or vomiting.    . pantoprazole (PROTONIX) 40 MG tablet TAKE 1 TABLET(40 MG) BY MOUTH DAILY AT 6 AM 90 tablet 1  . polyethylene glycol (MIRALAX / GLYCOLAX) packet Take 17 g by mouth 2 (two) times daily. 30 each 0  . pregabalin (LYRICA) 150 MG capsule Take 1 capsule (150 mg total) by mouth 3 (three) times daily. 270 capsule 1  . protriptyline (VIVACTIL) 10 MG tablet Take 10 mg by mouth 3 (three) times daily.   11  . rivaroxaban (XARELTO) 20 MG TABS tablet TAKE 1 TABLET(20 MG) BY MOUTH DAILY 90 tablet 1  . tiZANidine (ZANAFLEX) 4 MG tablet TAKE 1 TABLET(4 MG) BY MOUTH EVERY 6 HOURS AS NEEDED FOR MUSCLE SPASMS. DO NOT REFILL IN LESS THAN 30 DAYS 20 tablet 2  . UNABLE TO FIND CPAP MACHINE with standard Aclaim nasal mask with humidifier. Set at 14 cwp (Patient taking differently: CPAP MACHINE with standard Aclaim nasal mask with humidifier. Set at 4 cwp) 1 each 0  . Vortioxetine HBr (TRINTELLIX  PO) Take 25 mg by mouth at bedtime.     [name] zolpidem (AMBIEN) 10 MG tablet Take 10 mg by mouth at bedtime.     [name] amLODipine (NORVASC) 5 MG tablet Take 1 tablet (5 mg total) by mouth daily. 90 tablet 3  . ezetimibe (ZETIA) 10 MG  tablet Take 1 tablet (10 mg total) by mouth daily. 90 tablet 3   No current facility-administered medications on file prior to visit.     Allergies  Allergen Reactions  . Aspirin Swelling  . Ibuprofen Swelling  . Sustiva [Efavirenz] Swelling and Rash  . Nsaids Other (See Comments)    unknwn  . Lipitor [Atorvastatin Calcium] Other (See Comments)    Leg pain    Family History  Problem Relation Age of Onset  . Depression Brother   . Throat cancer Brother        half brother, never smoker  . COPD Mother   . Diabetes Neg Hx     BP 134/70 (BP Location: Left Arm, Patient Position: Sitting, Cuff Size: Large)   Pulse 90   Ht 5' 11"  (1.803 m)   Wt 270 lb 12.8 oz (122.8 kg)   SpO2 97%   BMI 37.77 kg/m    Review of Systems Denies LOC.      Objective:   Physical Exam VITAL SIGNS:  See vs page GENERAL: no distress Pulses: left foot pulses are intact.    MSK: no deformity of the feet or ankles, except for right transmetatarsal amputation.   CV: 1+ bilat edema of the legs.   Skin:  normal color and temp on the feet and ankles.  No erythema.   Neuro: sensation is intact to touch on the feet and ankles, but severely decreased from normal.   There is onychomycosis of the left foot toenails.    Lab Results  Component Value Date   HGBA1C 7.7 (A) 11/08/2018    Lab Results  Component Value Date   CREATININE 1.65 (H) 04/26/2018   BUN 19 04/26/2018   NA 143 04/26/2018   K 4.6 04/26/2018   CL 100 04/26/2018   CO2 24 04/26/2018       Assessment & Plan:  Insulin-requiring type 2 DM, with renal insuff: The pattern of his cbg's indicates he needs some adjustment in his therapy.     Patient Instructions  Please change the insulin to the numbers listed  below.   check your blood sugar twice a day.  vary the time of day when you check, between before the 3 meals, and at bedtime.  also check if you have symptoms of your blood sugar being too high or too low.  please keep a record of the readings and bring it to your next appointment here (or you can bring the meter itself).  You can write it on any piece of paper.  please call us sooner if your blood sugar goes below 70, or if you have a lot of readings over 200.   Please come back for a follow-up appointment in 2 months.

## 2018-11-09 DIAGNOSIS — E119 Type 2 diabetes mellitus without complications: Secondary | ICD-10-CM | POA: Insufficient documentation

## 2018-11-15 ENCOUNTER — Encounter: Payer: Self-pay | Admitting: Neurology

## 2018-11-16 DIAGNOSIS — M9903 Segmental and somatic dysfunction of lumbar region: Secondary | ICD-10-CM | POA: Diagnosis not present

## 2018-11-16 DIAGNOSIS — M9902 Segmental and somatic dysfunction of thoracic region: Secondary | ICD-10-CM | POA: Diagnosis not present

## 2018-11-16 DIAGNOSIS — M9901 Segmental and somatic dysfunction of cervical region: Secondary | ICD-10-CM | POA: Diagnosis not present

## 2018-11-17 ENCOUNTER — Ambulatory Visit (INDEPENDENT_AMBULATORY_CARE_PROVIDER_SITE_OTHER): Payer: PPO | Admitting: Neurology

## 2018-11-17 ENCOUNTER — Other Ambulatory Visit: Payer: Self-pay

## 2018-11-17 ENCOUNTER — Encounter: Payer: Self-pay | Admitting: Neurology

## 2018-11-17 ENCOUNTER — Ambulatory Visit (INDEPENDENT_AMBULATORY_CARE_PROVIDER_SITE_OTHER): Payer: PPO | Admitting: Family Medicine

## 2018-11-17 VITALS — BP 139/85 | HR 98 | Temp 97.5°F | Ht 71.0 in | Wt 265.0 lb

## 2018-11-17 DIAGNOSIS — E6609 Other obesity due to excess calories: Secondary | ICD-10-CM | POA: Insufficient documentation

## 2018-11-17 DIAGNOSIS — Z23 Encounter for immunization: Secondary | ICD-10-CM | POA: Diagnosis not present

## 2018-11-17 DIAGNOSIS — Z5181 Encounter for therapeutic drug level monitoring: Secondary | ICD-10-CM

## 2018-11-17 DIAGNOSIS — Z6836 Body mass index (BMI) 36.0-36.9, adult: Secondary | ICD-10-CM | POA: Diagnosis not present

## 2018-11-17 DIAGNOSIS — G4733 Obstructive sleep apnea (adult) (pediatric): Secondary | ICD-10-CM | POA: Diagnosis not present

## 2018-11-17 DIAGNOSIS — F322 Major depressive disorder, single episode, severe without psychotic features: Secondary | ICD-10-CM | POA: Diagnosis not present

## 2018-11-17 DIAGNOSIS — Z9989 Dependence on other enabling machines and devices: Secondary | ICD-10-CM

## 2018-11-17 NOTE — Progress Notes (Signed)
SLEEP MEDICINE CLINIC   Provider:  Larey Seat, M D  Referring Provider: [name], MD Primary Care Physician:  [name], MD  Chief Complaint  Patient presents with  . Follow-up    pt alone, rm 10. pt states that he is currently using the machine. pt thought he may be due for a new machine. the current set up on this machine was 07/13/2015. pt would like to transfer care of supplies to [employer] and when he is due get a new machine through them.     HPI: RV 11-17-2018  Daniel [name] is a 63 y.o. male , was originally seen here as a referral from Dr. Carlota Raspberry for a sleep consultation, he is also followed by [name] for Migraine headaches. The patient is HIV positive and adheres to social distancing COVID guidelines. He lives with a 78 year old aunt and brother.  Daniel [name] reports that over 14 years ago he had a sleep study and he was prescribed a CPAP machine. Mr. Bellanca underwent a sleep study on the my guidance on June 17, 2015 which confirmed the presence of moderate sleep apnea at an AHI of 20.2/h strongly accentuated in supine sleep to 36.3/h.  He was titrated to only 5 cmH2O which  but unable to initiate sleep in the lab at all.  He took 2 generic ambiens each night at home. Now on belsomra. Has nocturia 4-5 times at night.  We therefore ordered an auto titration device at the time and this is a follow up on compliance.   Daniel [name] has been 100% compliant use of CPAP with an average of over 12 hours of daily use, CPAP is set at 14 cmH2O pressure was 2 cm EPR, but currently he has a very high residual apnea index the AHI is 11.4 and this may be related to high air leakage with a maximum of 34 L/min.  I did not see any evidence of central apneas only obstructive apneas he still seems to snore sometimes through his CPAP.  I will change his settings to 5-16 cm water with 2 cm EPR and new supplies, his facial hair may make air seal impossible to achieve,  especially with a FFM. He is not shaven, the beard is not shortly trimmed.   His Mallompatti is still 3-4 , neck size 20" .BMI 37.   High geriatric depression score at 14/ 15 points- severely depressed.  How likely are you to doze in the following situations: 0 = not likely, 1 = slight chance, 2 = moderate chance, 3 = high chance  Sitting and Reading? Watching Television? Sitting inactive in a public place (theater or meeting)? Lying down in the afternoon when circumstances permit? Sitting and talking to someone? Sitting quietly after lunch without alcohol? In a car, while stopped for a few minutes in traffic? As a passenger in a car for an hour without a break?  Total =4/ 24  FSS at 60-63 points . Very High.          He has been using the CPAP ever since about 14 years ago he had is very first sleep study and was the result of the second set left tubes a prescription of his current machine. A [employer] manufactured [employer] CPAP. By appearance over 50 years old. [facility] that machine that the patient often also had to use mail orders to fill his supply needs. He is definitely due for a new  machine. In January 2014 over 3 years ago, he saw [name] here at the office for a neuropathy evaluation with foot drop. She concluded that this was either an HIV or diabetic induced neuropathy the results are scanned into epic for Dr. Everlene Farrier, at that time also prescribing physician. Daniel [name] has no routines in terms of sleep wake rhythm. 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  Sleep habits are as follows: Some nights Daniel [name] may watch TV all night through another night he may go to bed as early as 8 or 9 PM. There is no established sleep routine. Daniel [name] and his brother lives with her 59 year old aunt. He doesn't want to establish routines. He is not bothered by his living arrangements. His room is bedroom, Tv and computer room.  When he sleeps, he will be in bed by 3  or 4 AM and sleeps 7-8 hours . Eats oatmeal in AM and sometimes returns to bed to sleep there all night and day. He sleeps in all positions, 2 pillowes. His bedroom is not  cool , nor quiet - he used light blocking shades.  He has a illuminated bedroom, doesn't watch TV in the dark 240 watts.  His psychiatrist is Dr. Toy Care who has been trying to work on this for years he reports. He is disinterested in most things in life.    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 Social history: Daniel [name] is currently unemployed, he drinks juices but no coffee or other "caffeinated  Beverages", he likes milk and ice cream. He than advised me , he  drinks iced tea -which has caffeine. Last year during I montage in [place] he left of a lot of fast food but couldn't get his beloved iced tea, his sugar diabetes decompensated completely. His weight was 208 pounds by October 2016 he reports. After his return he was placed on insulin.    Interval history from 09/05/2015. Daniel [name] reports that he has received some steroid shots into his knees which has helped this pain he also has used a topical pain cream which has helped 2. He has seen my colleague [name]. He is here today for a compliance visit on CPAP after a split-night polysomnography from 06/17/2015. He was diagnosed with a moderate degree of apnea, his AHI was 20.2 and consisted almost entirely of shallow breathing spells not frank apnea. REM sleep was not seen. The patient slept in supine position for part of the night and his AHI went over 36.3. He was a loud snorer during the night in the sleep lab. He did not retain carbon dioxide, he had 49 minutes of desaturation time, he was titrated to only 6 cm water pressure was alleviated part of his apnea. I have ordered an auto titrated for him to  see if he can further help hypoxemia. Insomnia was also clearly present but it was not caused by an organic sleep problem it was not the apnea causing insomnia. I will defer this treatment to Dr. Toy Care. His compliance report dated 09/03/2015 shows 100% compliance for days of use and 97% compliance for over 4 hours of nightly use currently he uses the machine on average 11 hours and 24 minutes daily. His AHI is 1.5 at  a pressure of 14 cm water with 2 cm EPR. This is an excellent result and he does not need to adjust that he has some air leaks partially due to facial hair. He likes a full face mask.   Review of Systems: Out of a complete 14 system review, the patient complains of only the following symptoms, and all other reviewed systems are negative.  Epworth score 10  , Fatigue severity score  58 from 49  , depression score 14/15- indicative of major depression.    Social History   Socioeconomic History  . Marital status: Single    Spouse name: Not on file  . Number of children: Not on file  . Years of education: Not on file  . Highest education level: Not on file  Occupational History    Comment: DISABILITY  Social Needs  . Financial resource strain: Not hard at all  . Food insecurity    Worry: Never true    Inability: Never true  . Transportation needs    Medical: No    Non-medical: No  Tobacco Use  . Smoking status: Former Smoker    Packs/day: 0.10    Years: 10.00    Pack years: 1.00    Types: Cigars    Quit date: 08/09/2014    Years since quitting: 4.2  . Smokeless tobacco: Never Used  Substance and Sexual Activity  . Alcohol use: No    Alcohol/week: 0.0 standard drinks  . Drug use: No  . Sexual activity: Not Currently    Partners: Male    Comment: pt. declined condoms  Lifestyle  . Physical activity    Days per week: 0 days    Minutes per session: 0 min  . Stress: Only a little  Relationships  . Social connections    Talks on phone: More than three times a week     Gets together: Once a week    Attends religious service: Never    Active member of club or organization: No    Attends meetings of clubs or organizations: Not on file    Relationship status: Never married  . Intimate partner violence    Fear of current or ex partner: No    Emotionally abused: No    Physically abused: No    Forced sexual activity: No  Other Topics Concern  . Not on file  Social History Narrative   Epworth Sleepiness Scale = 7 (as of 03/16/2015)    Family History  Problem Relation Age of Onset  . Depression Brother   . Throat cancer Brother        half brother, never smoker  . COPD Mother   . Diabetes Neg Hx     Past Medical History:  Diagnosis Date  . ADHD (attention deficit hyperactivity disorder)   . Anxiety   . Chronic kidney disease   . Clotting disorder ([place])   . Depression   . Diabetes mellitus without complication ([place])   . Diabetes mellitus, type II ([place])   . Dizziness 03/17/2015  . Essential hypertension 06/25/2018  . GERD (gastroesophageal reflux disease)   . HIV disease ([place])   . HIV infection ([place])   . Liver disease   . OSA (obstructive sleep apnea) 07/25/2015   Uses CPAP regularly  . Peripheral vascular disease ([place])   . Ulcer     Past Surgical History:  Procedure Laterality Date  . AMPUTATION Right 10/02/2017   Procedure: RIGHT TRANSMETATARSAL AMPUTATION;  Surgeon: Leandrew Koyanagi, MD;  Location: [facility];  Service: Orthopedics;  Laterality: Right;  . SMALL INTESTINE SURGERY    . STOMACH SURGERY    . TOE AMPUTATION Right 08/2016   right great toe    Current Outpatient Medications  Medication Sig Dispense Refill  . abacavir-dolutegravir-lamiVUDine (TRIUMEQ) 600-50-300 MG tablet Take 1 tablet by mouth daily. 30 tablet 4  . acetaminophen (TYLENOL) 500 MG tablet Take 1,000-1,500 mg by mouth every 8 (eight) hours as needed for moderate pain.     [name] ALPRAZolam (XANAX) 1 MG tablet Take 1 mg by mouth 4 (four) times daily as needed for anxiety.      [name] amphetamine-dextroamphetamine (ADDERALL) 30 MG tablet Take 30 mg by mouth 3 (three) times daily.    . B-D ULTRAFINE III SHORT PEN 31G X 8 MM MISC USE AS DIRECTED TO INJECT LANTUS AND NOVOLOG DAILY 100 each 0  . BELSOMRA 20 MG TABS TK 1 T PO HS    . Brexpiprazole (REXULTI) 1 MG TABS Take 1 mg by mouth at bedtime.    . calcium-vitamin D (OSCAL WITH D) 500-200 MG-UNIT tablet Take 1 tablet by mouth.    . Cholecalciferol (VITAMIN D3) 250 MCG (10000 UT) TABS Take by mouth.    . Continuous Blood Gluc Sensor (FREESTYLE LIBRE 14 DAY SENSOR) MISC AS DIRECTED EVERY 14 DAYS 6 each 3  . diclofenac sodium (VOLTAREN) 1 % GEL APPLY 2 GRAMS TOPICALLY TO EACH KNEE IN THE MORNING AND AT BEDTIME, APPLY 1 GRAM TO EACH KNEE IN THE AFTERNOON 300 g 0  . divalproex (DEPAKOTE ER) 500 MG 24 hr tablet TAKE 1 TABLET(500 MG) BY MOUTH AT BEDTIME 90 tablet 1  . furosemide (LASIX) 40 MG tablet TAKE 1 TABLET(40 MG) BY MOUTH DAILY 30 tablet 2  . glucose blood (FREESTYLE PRECISION NEO TEST) test strip Used to check blood sugars twice daily. 100 each 12  . insulin aspart (NOVOLOG FLEXPEN) 100 UNIT/ML FlexPen 15 units with breakfast, and 25 unit with supper 15 mL 11  . Insulin Glargine (LANTUS SOLOSTAR) 100 UNIT/ML Solostar Pen Inject 260 Units into the skin every morning. 30 pen PRN  . levothyroxine (SYNTHROID, LEVOTHROID) 50 MCG tablet Take 1 tablet (50 mcg total) by mouth at bedtime. 30 tablet 11  . mupirocin ointment (BACTROBAN) 2 % Place 1 application into the nose 2 (two) times daily. 22 g 3  . ondansetron (ZOFRAN) 8 MG tablet Take 8 mg by mouth every 8 (eight) hours as needed for nausea or vomiting.    . pantoprazole (PROTONIX) 40 MG tablet TAKE 1 TABLET(40 MG) BY MOUTH DAILY AT 6 AM 90 tablet 1  . polyethylene glycol (MIRALAX / GLYCOLAX) packet Take 17 g by mouth 2 (two) times daily. 30 each 0  . pregabalin (LYRICA) 150 MG capsule Take 1 capsule (150 mg total) by mouth 3 (three) times daily. 270 capsule 1  . protriptyline  (VIVACTIL) 10 MG tablet Take 10 mg by mouth 3 (three) times daily.   11  . rivaroxaban (XARELTO) 20 MG TABS tablet TAKE 1 TABLET(20 MG) BY MOUTH DAILY 90 tablet 1  . tiZANidine (ZANAFLEX) 4 MG tablet TAKE 1 TABLET(4 MG) BY MOUTH EVERY 6 HOURS AS NEEDED FOR MUSCLE SPASMS. DO NOT REFILL IN LESS THAN 30 DAYS 20 tablet 2  . UNABLE TO FIND CPAP MACHINE with standard Aclaim nasal mask with humidifier. Set at 14 cwp (Patient taking differently: CPAP MACHINE with standard Aclaim nasal mask with humidifier. Set at 4 cwp) 1 each 0  .  Vortioxetine HBr (TRINTELLIX PO) Take 25 mg by mouth at bedtime.     [name] zolpidem (AMBIEN) 10 MG tablet Take 10 mg by mouth at bedtime.     [name] amLODipine (NORVASC) 5 MG tablet Take 1 tablet (5 mg total) by mouth daily. 90 tablet 3  . ezetimibe (ZETIA) 10 MG tablet Take 1 tablet (10 mg total) by mouth daily. 90 tablet 3   No current facility-administered medications for this visit.     Allergies as of 11/17/2018 - Review Complete 11/17/2018  Allergen Reaction Noted  . Aspirin Swelling 06/20/2015  . Ibuprofen Swelling 06/28/2015  . Sustiva [efavirenz] Swelling and Rash 06/04/2006  . Nsaids Other (See Comments) 05/21/2015  . Lipitor [atorvastatin calcium] Other (See Comments) 08/25/2018    Vitals: BP 139/85   Pulse 98   Temp (!) 97.5 F (36.4 C)   Ht 5' 11"  (1.803 m)   Wt 265 lb (120.2 kg)   BMI 36.96 kg/m  Last Weight:  Wt Readings from Last 1 Encounters:  11/17/18 265 lb (120.2 kg)   IDP:OEUM mass index is 36.96 kg/m.     Last Height:   Ht Readings from Last 1 Encounters:  11/17/18 5' 11"  (1.803 m)    Physical exam:  General: The patient is awake, alert and appears not in acute distress. The patient is not  Groomed. He has full facial hair.  Head: Normocephalic, atraumatic. Neck is supple. Mallampati 4,  neck circumference: 18.5 . Nasal airflow  restricted , TMJ is evident . Retrognathia is not seen.  Cardiovascular:  Regular rate and rhythm , without   murmurs or carotid bruit, and without distended neck veins. Respiratory: Lungs are clear to auscultation. Skin:  Without evidence of edema, or rash Trunk: BMI is elevated . The patient's posture is not erect   Neurologic exam : The patient is awake and alert, oriented to place and time.   Memory subjective described as intact.  Attention span & concentration ability appears normal.  Speech is fluent,  without dysarthria, dysphonia or aphasia.  Mood and affect are appropriate.  Cranial nerves: taste and smell are intact.  Pupils are equal and briskly reactive to light.  Extraocular movements  in vertical and horizontal planes intact Hearing impaired. Facial sensation intact Facial motor strength is symmetric and tongue and uvula move midline. Shoulder shrug was symmetrical.  Motor exam:  Normal tone, muscle bulk and symmetric strength in all extremities. Sensory:  Fine touch,and vibration were  normal. Coordination: Rapid alternating movements / Finger-to-nose maneuver normal without evidence of ataxia, dysmetria or tremor. Gait and station: not evaluated. Deep tendon reflexes: in the upper and lower extremities are symmetric and intact. Babinski maneuver response is downgoing.  The patient was advised of the nature of the diagnosed sleep disorder , the treatment options and risks for general a health and wellness arising from not treating the condition.  I spent more than 25 minutes of face to face time with the patient. Greater than 50% of time was spent in counseling and coordination of care. We have discussed the diagnosis and differential and I answered the patient's questions.     Assessment:  After physical and neurologic examination, review of laboratory studies,  Personal review of imaging studies, reports of other /same  Imaging studies ,  Results of polysomnography/ neurophysiology testing and pre-existing records as far as provided in visit., my assessment is    Mr. Leclaire is an  established patient of Dr. Toy Care and has seen Dr.  [name] for the last 6 years . He has a history of diabetes mellitus, chronic depression, HIV infection, blood clotting disorder foot drop and ADHD. He is followed for chronic renal insufficiency by a nephrologist- his kidney disease has been stable.  He is currently taking Xanax; codeine, Adderall, Lipitor he checks his blood sugars at home he takes Synthroid 75 g, Antivert when necessary was given in February a prescription for oxycodone, has Compazine, ranitidine, Xarelto, 20 Lex, Ambien.   His review of system is positive for frequency of urination, heat intolerance, light sensitivity, shortness of breath and coughing, he had recently a DVT which caused his leg to swell. Has a history of apnea treated with a CPAP machine that is by now very old, and has recurrent daytime sleepiness issues. The currnet CPAP machine is from 2017, produces a high ai rleak and residual AHI of 11. 4/h   2) Depression- highly impairing restorative sleep quality and causing insomnia, hypersomnia.    Plan:  Treatment plan and additional workup : I am not too optimistic that Mr  Weidler will learn to establish routines and sleep hygiene after this has aparently been preached to him for 17 years.  He has a history of Apnea without any documentation in Epic. Depression- followed by Dr Toy Care.  !!!!  I will open the autotitration capacity setting to 6- 16 cm water, 2 cm EPR and heated humidity. I think he will need to switch to a nasal pillow, cradle or mask with a chin strap.   Mr Whetzel will be followed for sleep, not general neurology-  for any general neurology he will need to see Dr [name].      Asencion Partridge Gabreal Worton MD  11/17/2018   CC: [name], [address] [place],  [place] [address]

## 2018-11-17 NOTE — Patient Instructions (Signed)
Please remember to try to maintain good sleep hygiene, which means: Keep a regular sleep and wake schedule, try not to exercise or have a meal within 2 hours of your bedtime, try to keep your bedroom conducive for sleep, that is, cool and dark, without light distractors such as an illuminated alarm clock, and refrain from watching TV right before sleep or in the middle of the night and do not keep the TV or radio on during the night. Also, try not to use or play on electronic devices at bedtime, such as your cell phone, tablet PC or laptop. If you like to read at bedtime on an electronic device, try to dim the background light as much as possible. Do not eat in the middle of the night.    For chronic insomnia, you are best followed by a psychiatrist and/or sleep psychologist.       CPAP and BPAP Information CPAP and BPAP are methods of helping a person breathe with the use of air pressure. CPAP stands for "continuous positive airway pressure." BPAP stands for "bi-level positive airway pressure." In both methods, air is blown through your nose or mouth and into your air passages to help you breathe well. CPAP and BPAP use different amounts of pressure to blow air. With CPAP, the amount of pressure stays the same while you breathe in and out. With BPAP, the amount of pressure is increased when you breathe in (inhale) so that you can take larger breaths. Your health care provider will recommend whether CPAP or BPAP would be more helpful for you. Why are CPAP and BPAP treatments used? CPAP or BPAP can be helpful if you have:  Sleep apnea.  Chronic obstructive pulmonary disease (COPD).  Heart failure.  Medical conditions that weaken the muscles of the chest including muscular dystrophy, or neurological diseases such as amyotrophic lateral sclerosis (ALS).  Other problems that cause breathing to be weak, abnormal, or difficult. CPAP is most commonly used for obstructive sleep apnea (OSA) to keep  the airways from collapsing when the muscles relax during sleep. How is CPAP or BPAP administered? Both CPAP and BPAP are provided by a small machine with a flexible plastic tube that attaches to a plastic mask. You wear the mask. Air is blown through the mask into your nose or mouth. The amount of pressure that is used to blow the air can be adjusted on the machine. Your health care provider will determine the pressure setting that should be used based on your individual needs. When should CPAP or BPAP be used? In most cases, the mask only needs to be worn during sleep. Generally, the mask needs to be worn throughout the night and during any daytime naps. People with certain medical conditions may also need to wear the mask at other times when they are awake. Follow instructions from your health care provider about when to use the machine. What are some tips for using the mask?   Because the mask needs to be snug, some people feel trapped or closed-in (claustrophobic) when first using the mask. If you feel this way, you may need to get used to the mask. One way to do this is by holding the mask loosely over your nose or mouth and then gradually applying the mask more snugly. You can also gradually increase the amount of time that you use the mask.  Masks are available in various types and sizes. Some fit over your mouth and nose while others fit over  just your nose. If your mask does not fit well, talk with your health care provider about getting a different one.  If you are using a mask that fits over your nose and you tend to breathe through your mouth, a chin strap may be applied to help keep your mouth closed.  The CPAP and BPAP machines have alarms that may sound if the mask comes off or develops a leak.  If you have trouble with the mask, it is very important that you talk with your health care provider about finding a way to make the mask easier to tolerate. Do not stop using the mask.  Stopping the use of the mask could have a negative impact on your health. What are some tips for using the machine?  Place your CPAP or BPAP machine on a secure table or stand near an electrical outlet.  Know where the on/off switch is located on the machine.  Follow instructions from your health care provider about how to set the pressure on your machine and when you should use it.  Do not eat or drink while the CPAP or BPAP machine is on. Food or fluids could get pushed into your lungs by the pressure of the CPAP or BPAP.  Do not smoke. Tobacco smoke residue can damage the machine.  For home use, CPAP and BPAP machines can be rented or purchased through home health care companies. Many different brands of machines are available. Renting a machine before purchasing may help you find out which particular machine works well for you.  Keep the CPAP or BPAP machine and attachments clean. Ask your health care provider for specific instructions. Get help right away if:  You have redness or open areas around your nose or mouth where the mask fits.  You have trouble using the CPAP or BPAP machine.  You cannot tolerate wearing the CPAP or BPAP mask.  You have pain, discomfort, and bloating in your abdomen. Summary  CPAP and BPAP are methods of helping a person breathe with the use of air pressure.  Both CPAP and BPAP are provided by a small machine with a flexible plastic tube that attaches to a plastic mask.  If you have trouble with the mask, it is very important that you talk with your health care provider about finding a way to make the mask easier to tolerate. This information is not intended to replace advice given to you by your health care provider. Make sure you discuss any questions you have with your health care provider. Document Released: 11/09/2003 Document Revised: 06/02/2018 Document Reviewed: 12/31/2015 Elsevier Patient Education  2020 [employer].

## 2018-11-18 LAB — CBC
Hematocrit: 50 % (ref 37.5–51.0)
Hemoglobin: 17.3 g/dL (ref 13.0–17.7)
MCH: 32.1 pg (ref 26.6–33.0)
MCHC: 34.6 g/dL (ref 31.5–35.7)
MCV: 93 fL (ref 79–97)
Platelets: 149 10*3/uL — ABNORMAL LOW (ref 150–450)
RBC: 5.39 x10E6/uL (ref 4.14–5.80)
RDW: 14.2 % (ref 11.6–15.4)
WBC: 8 10*3/uL (ref 3.4–10.8)

## 2018-11-18 LAB — COMPREHENSIVE METABOLIC PANEL
ALT: 79 IU/L — ABNORMAL HIGH (ref 0–44)
AST: 93 IU/L — ABNORMAL HIGH (ref 0–40)
Albumin/Globulin Ratio: 1.6 (ref 1.2–2.2)
Albumin: 4.5 g/dL (ref 3.8–4.8)
Alkaline Phosphatase: 121 IU/L — ABNORMAL HIGH (ref 39–117)
BUN/Creatinine Ratio: 13 (ref 10–24)
BUN: 21 mg/dL (ref 8–27)
Bilirubin Total: 0.9 mg/dL (ref 0.0–1.2)
CO2: 24 mmol/L (ref 20–29)
Calcium: 9.4 mg/dL (ref 8.6–10.2)
Chloride: 98 mmol/L (ref 96–106)
Creatinine, Ser: 1.58 mg/dL — ABNORMAL HIGH (ref 0.76–1.27)
GFR calc Af Amer: 52 mL/min/{1.73_m2} — ABNORMAL LOW (ref >59)
GFR calc non Af Amer: 45 mL/min/{1.73_m2} — ABNORMAL LOW (ref >59)
Globulin, Total: 2.8 g/dL (ref 1.5–4.5)
Glucose: 303 mg/dL — ABNORMAL HIGH (ref 65–99)
Potassium: 4.4 mmol/L (ref 3.5–5.2)
Sodium: 137 mmol/L (ref 134–144)
Total Protein: 7.3 g/dL (ref 6.0–8.5)

## 2018-11-18 LAB — PROTIME-INR
INR: 1.1 (ref 0.8–1.2)
Prothrombin Time: 11.7 s (ref 9.1–12.0)

## 2018-11-22 ENCOUNTER — Encounter: Payer: Self-pay | Admitting: Cardiovascular Disease

## 2018-11-22 ENCOUNTER — Ambulatory Visit: Payer: PPO | Admitting: Cardiovascular Disease

## 2018-11-22 ENCOUNTER — Other Ambulatory Visit: Payer: Self-pay

## 2018-11-22 ENCOUNTER — Encounter: Payer: PPO | Admitting: Internal Medicine

## 2018-11-22 VITALS — BP 123/77 | Temp 97.9°F | Ht 71.0 in | Wt 262.6 lb

## 2018-11-22 DIAGNOSIS — I10 Essential (primary) hypertension: Secondary | ICD-10-CM | POA: Diagnosis not present

## 2018-11-22 DIAGNOSIS — R600 Localized edema: Secondary | ICD-10-CM

## 2018-11-22 DIAGNOSIS — R2681 Unsteadiness on feet: Secondary | ICD-10-CM | POA: Diagnosis not present

## 2018-11-22 NOTE — Patient Instructions (Signed)
Medication Instructions:  Your physician recommends that you continue on your current medications as directed. Please refer to the Current Medication list given to you today.  If you need a refill on your cardiac medications before your next appointment, please call your pharmacy.   Lab work: NONE  Testing/Procedures: NONE  Follow-Up: At [facility], you and your health needs are our priority.  As part of our continuing mission to provide you with exceptional heart care, we have created designated Provider Care Teams.  These Care Teams include your primary Cardiologist (physician) and Advanced Practice Providers (APPs -  Physician Assistants and Nurse Practitioners) who all work together to provide you with the care you need, when you need it. You will need a follow up appointment in 12 months.  Please call our office 2 months in advance to schedule this appointment.  You may see Skeet Latch, MD or one of the following Advanced Practice Providers on your designated Care Team:   [name], PA-C Roby Lofts, [place] . [name], PA-C  Any Other Special Instructions Will Be Listed Below (If Applicable).  YOU HAVE BEEN REFERRED TO PHYSICAL THERAPY FOR GAIT TRAINING AND BALANCE

## 2018-11-22 NOTE — Progress Notes (Signed)
Cardiology Office Note   Date:  11/22/2018   ID:  Whitman, Meinhardt 1955/11/12, MRN [id number]  PCP:  [name], MD  Cardiologist:   Skeet Latch, MD   No chief complaint on file.   Daniel [name] is a 63 y.o. male with HIV, recurrent DV/PE on lifelong anticoagulation, OSA on CPAP, and diabetes who presents for follow up.   History of Present Illness:   Daniel [name] was fist evaluated 02/2015 for dizziness and falls. The symptoms had been ongoing for several months. He felt as though the room was spinning when changing position, leaning his head back or laying down. He was referred for an echo 03/2015 that revealed LVEF 60-65% with mild LVH. He also had carotid Dopplers 02/2015 that revealed minimal plaque bilaterally. Additionally, he reported exertional dyspnea so he was referred for [facility] which was negative for ischemia. In the interim Daniel [name] also had nerve conduction studies that revealed peripheral neuropathy consistent with diabetic neuropathy. He was referred for a 30 day event monitor 07/30/2015 that was unremarkable.  Daniel [name] had toes amputated on his R leg. ABIs were normal 09/2015 prior to his great toe amputation.   Atypical chest pain and had a Lexiscan Myoview 03/2018 that revealed LVEF 59% and no ischemia.  Daniel [name] had lipids checked which revealed an LDL of 113.  This was down from 154.  He had a persistent transaminitis with AST 52 and ALT 64.  This has persisted since stopping statins.  At his last appointment he was doing well physically but had depression that was worse lately.  His blood pressure was poorly controlled so amlodipine was added.  He followed up with [name], PA-C on 07/2018, at which time his blood pressure remained poorly controlled.  Since that time he has been stable from a cardiac standpoint.  He complains of pain in his knees that make it difficult for him to walk.  He also is unsteady on his feet since his amputations.   This is compounded by the neuropathy in his feet.  He does not get much exercise.  He is also had several falls.  Last week he fell 3 times in 1 day.  His knees give out and he is just unsteady and trips easily.  He has not experienced any chest pain and his breathing has been stable.  He only gets short of breath when he bends over.  He has some dizziness when he closes his eyes while standing but otherwise is okay.  He denies any syncope.  He denies lower extremity edema, orthopnea, or PND.  He notes that his depression has been much worse lately.   Past Medical History:  Diagnosis Date  . ADHD (attention deficit hyperactivity disorder)   . Anxiety   . Chronic kidney disease   . Clotting disorder ([place])   . Depression   . Diabetes mellitus without complication ([place])   . Diabetes mellitus, type II ([place])   . Dizziness 03/17/2015  . Essential hypertension 06/25/2018  . GERD (gastroesophageal reflux disease)   . HIV disease ([place])   . HIV infection ([place])   . Liver disease   . OSA (obstructive sleep apnea) 07/25/2015   Uses CPAP regularly  . Peripheral vascular disease ([place])   . Ulcer     Past Surgical History:  Procedure Laterality Date  . AMPUTATION Right 10/02/2017   Procedure: RIGHT TRANSMETATARSAL AMPUTATION;  Surgeon: Leandrew Koyanagi, MD;  Location: [place];  Service: Orthopedics;  Laterality: Right;  . SMALL INTESTINE SURGERY    . STOMACH SURGERY    . TOE AMPUTATION Right 08/2016   right great toe    Current Outpatient Medications  Medication Sig Dispense Refill  . abacavir-dolutegravir-lamiVUDine (TRIUMEQ) 600-50-300 MG tablet Take 1 tablet by mouth daily. 30 tablet 4  . acetaminophen (TYLENOL) 500 MG tablet Take 1,000-1,500 mg by mouth every 8 (eight) hours as needed for moderate pain.     [name] ALPRAZolam (XANAX) 1 MG tablet Take 1 mg by mouth 4 (four) times daily as needed for anxiety.     [name] amLODipine (NORVASC) 5 MG tablet Take 1 tablet (5 mg total) by mouth daily. 90 tablet 3  .  amphetamine-dextroamphetamine (ADDERALL) 30 MG tablet Take 30 mg by mouth 3 (three) times daily.    . B-D ULTRAFINE III SHORT PEN 31G X 8 MM MISC USE AS DIRECTED TO INJECT LANTUS AND NOVOLOG DAILY 100 each 0  . BELSOMRA 20 MG TABS TK 1 T PO HS    . Brexpiprazole (REXULTI) 1 MG TABS Take 1 mg by mouth at bedtime.    . calcium-vitamin D (OSCAL WITH D) 500-200 MG-UNIT tablet Take 1 tablet by mouth.    . Cholecalciferol (VITAMIN D3) 250 MCG (10000 UT) TABS Take by mouth.    . Continuous Blood Gluc Sensor (FREESTYLE LIBRE 14 DAY SENSOR) MISC AS DIRECTED EVERY 14 DAYS 6 each 3  . diclofenac sodium (VOLTAREN) 1 % GEL APPLY 2 GRAMS TOPICALLY TO EACH KNEE IN THE MORNING AND AT BEDTIME, APPLY 1 GRAM TO EACH KNEE IN THE AFTERNOON 300 g 0  . divalproex (DEPAKOTE ER) 500 MG 24 hr tablet TAKE 1 TABLET(500 MG) BY MOUTH AT BEDTIME 90 tablet 1  . ezetimibe (ZETIA) 10 MG tablet Take 1 tablet (10 mg total) by mouth daily. 90 tablet 3  . furosemide (LASIX) 40 MG tablet TAKE 1 TABLET(40 MG) BY MOUTH DAILY 30 tablet 2  . glucose blood (FREESTYLE PRECISION NEO TEST) test strip Used to check blood sugars twice daily. 100 each 12  . insulin aspart (NOVOLOG FLEXPEN) 100 UNIT/ML FlexPen 15 units with breakfast, and 25 unit with supper 15 mL 11  . Insulin Glargine (LANTUS SOLOSTAR) 100 UNIT/ML Solostar Pen Inject 260 Units into the skin every morning. 30 pen PRN  . levothyroxine (SYNTHROID, LEVOTHROID) 50 MCG tablet Take 1 tablet (50 mcg total) by mouth at bedtime. 30 tablet 11  . mupirocin ointment (BACTROBAN) 2 % Place 1 application into the nose 2 (two) times daily. 22 g 3  . ondansetron (ZOFRAN) 8 MG tablet Take 8 mg by mouth every 8 (eight) hours as needed for nausea or vomiting.    . pantoprazole (PROTONIX) 40 MG tablet TAKE 1 TABLET(40 MG) BY MOUTH DAILY AT 6 AM 90 tablet 1  . polyethylene glycol (MIRALAX / GLYCOLAX) packet Take 17 g by mouth 2 (two) times daily. 30 each 0  . pregabalin (LYRICA) 150 MG capsule Take 1  capsule (150 mg total) by mouth 3 (three) times daily. 270 capsule 1  . protriptyline (VIVACTIL) 10 MG tablet Take 10 mg by mouth 3 (three) times daily.   11  . rivaroxaban (XARELTO) 20 MG TABS tablet TAKE 1 TABLET(20 MG) BY MOUTH DAILY 90 tablet 1  . tiZANidine (ZANAFLEX) 4 MG tablet TAKE 1 TABLET(4 MG) BY MOUTH EVERY 6 HOURS AS NEEDED FOR MUSCLE SPASMS. DO NOT REFILL IN LESS THAN 30 DAYS 20 tablet 2  . UNABLE TO FIND  CPAP MACHINE with standard Aclaim nasal mask with humidifier. Set at 14 cwp (Patient taking differently: CPAP MACHINE with standard Aclaim nasal mask with humidifier. Set at 4 cwp) 1 each 0  . Vortioxetine HBr (TRINTELLIX PO) Take 25 mg by mouth at bedtime.     [name] zolpidem (AMBIEN) 10 MG tablet Take 10 mg by mouth at bedtime.      No current facility-administered medications for this visit.     Allergies:   Aspirin, Ibuprofen, Sustiva [efavirenz], Nsaids, and Lipitor [atorvastatin calcium]    Social History:  The patient  reports that he quit smoking about 4 years ago. His smoking use included cigars. He has a 1.00 pack-year smoking history. He has never used smokeless tobacco. He reports that he does not drink alcohol or use drugs.   Family History:  The patient's family history includes COPD in his mother; Depression in his brother; Throat cancer in his brother.    ROS:  Please see the history of present illness.  Otherwise, review of systems are positive for chronic R LE edema.   All other systems are reviewed and negative.    PHYSICAL EXAM: VS:  BP 123/77   Temp 97.9 F (36.6 C)   Ht 5' 11"  (1.803 m)   Wt 262 lb 9.6 oz (119.1 kg)   SpO2 96%   BMI 36.63 kg/m  , BMI Body mass index is 36.63 kg/m. GENERAL:  Well appearing HEENT: Pupils equal round and reactive, fundi not visualized, oral mucosa unremarkable NECK:  No jugular venous distention, waveform within normal limits, carotid upstroke brisk and symmetric, no bruits, no thyromegaly LYMPHATICS:  No cervical  adenopathy LUNGS:  Clear to auscultation bilaterally HEART:  RRR.  PMI not displaced or sustained,S1 and S2 within normal limits, no S3, no S4, no clicks, no rubs, no murmurs ABD:  Flat, positive bowel sounds normal in frequency in pitch, no bruits, no rebound, no guarding, no midline pulsatile mass, no hepatomegaly, no splenomegaly EXT:  2 plus pulses throughout, 2+ LE edema to mid tibia bilaterally, no cyanosis no clubbing SKIN:  No rashes no nodules NEURO:  Cranial nerves II through XII grossly intact, motor grossly intact throughout PSYCH:  Cognitively intact, oriented to person place and time   EKG:  EKG is ordered today. The ekg ordered 03/16/15 demonstrates sinus rhythm.  Rate 90 bpm.  03/24/18: Sinus rhythm.  Rate 97 bpm.  Occasional PVCs.   11/22/2018: Sinus rhythm.  Rate 82 bpm.  Incomplete right bundle branch block.  Carotid Doppler 03/23/15: IMPRESSION: 1. Trace smooth heterogeneous plaque in the distal left common carotid artery. 2. No evidence of internal carotid plaque or stenosis bilaterally. 3. Vertebral arteries remain patent with normal antegrade flow.  Echo 04/05/15: Study Conclusions  - Left ventricle: The cavity size was normal. Wall thickness was  increased in a pattern of mild LVH. Systolic function was normal.  The estimated ejection fraction was in the range of 60% to 65%.  Biplane speckle tracking LVEF was not accurately measured and  therefore not reported. Wall motion was normal; there were no  regional wall motion abnormalities. Left ventricular diastolic  function parameters were normal for the patient&'s age. - Aortic valve: Mildly calcified annulus. Trileaflet. - Mitral valve: Calcified annulus. There was trivial regurgitation. - Right atrium: Central venous pressure (est): 3 mm Hg. - Tricuspid valve: There was physiologic regurgitation. - Pulmonary arteries: Systolic pressure could not be accurately  estimated. - Pericardium, extracardiac:  There was no pericardial effusion.  30 day Event Monitor 07/30/15:  Quality: Fair. Baseline artifact.  Sinus rhythm and sinus tachycardia noted during an episode of syncope.  Lexiscan Myoview 03/2018:  Normal perfusion NO ischemia or scar  Nuclear stress EF: 59%.  There was no ST segment deviation noted during stress.  The study is normal.  This is a low risk study.  Recent Labs: 11/17/2018: ALT 79; BUN 21; Creatinine, Ser 1.58; Hemoglobin 17.3; Platelets 149; Potassium 4.4; Sodium 137    Lipid Panel    Component Value Date/Time   CHOL 201 (H) 04/26/2018 1541   TRIG 247 (H) 04/26/2018 1541   HDL 39 (L) 04/26/2018 1541   CHOLHDL 5.2 (H) 04/26/2018 1541   CHOLHDL 3.3 08/09/2015 0956   VLDL 22 08/09/2015 0956   LDLCALC 113 (H) 04/26/2018 1541      Wt Readings from Last 3 Encounters:  11/22/18 262 lb 9.6 oz (119.1 kg)  11/17/18 265 lb (120.2 kg)  11/08/18 270 lb 12.8 oz (122.8 kg)      ASSESSMENT AND PLAN:  # Atypical chest pain: Resolved.  Lexiscan Myoview was negative for ischemia 03/2018.  # Hyperlipidemia:  He stopped his atorvastatin 2/2 myalgias.  Statins stopped 2/2 transaminitis.  Continue Zetia.   # LE edema: Stable on lasix.    # DVT: Recurrent, unprovoked DVT. Lifelong Xarelto.  # SOB: Stress and echo were unremarkable. Symptoms are likely due to deconditioning and obesity. Repeat stress test as above.  # Recurrent falls:  He never had PT after transmetatarsal amputation and is falling a lot.  We will refer him to PT for balance and gait training.    Current medicines are reviewed at length with the patient today.  The patient does not have concerns regarding medicines.  The following changes have been made:  no change  Labs/ tests ordered today include:   Orders Placed This Encounter  Procedures  . Ambulatory referral to Physical Therapy  . EKG 12-Lead     Disposition:   FU with Chaunda Vandergriff C. [name], MD, [facility] in 1 year.   .  Signed,  Toretto Tingler C. [name], MD, [facility]  11/22/2018 3:51 PM    [facility]

## 2018-11-23 DIAGNOSIS — M9902 Segmental and somatic dysfunction of thoracic region: Secondary | ICD-10-CM | POA: Diagnosis not present

## 2018-11-23 DIAGNOSIS — M9903 Segmental and somatic dysfunction of lumbar region: Secondary | ICD-10-CM | POA: Diagnosis not present

## 2018-11-23 DIAGNOSIS — M9901 Segmental and somatic dysfunction of cervical region: Secondary | ICD-10-CM | POA: Diagnosis not present

## 2018-11-29 ENCOUNTER — Ambulatory Visit (HOSPITAL_COMMUNITY): Payer: PPO | Admitting: Physical Therapy

## 2018-11-29 ENCOUNTER — Telehealth (HOSPITAL_COMMUNITY): Payer: Self-pay | Admitting: Physical Therapy

## 2018-11-29 NOTE — Telephone Encounter (Signed)
pt called to cancel this appt due to he is in pain with his back

## 2018-11-30 DIAGNOSIS — M9903 Segmental and somatic dysfunction of lumbar region: Secondary | ICD-10-CM | POA: Diagnosis not present

## 2018-11-30 DIAGNOSIS — M9902 Segmental and somatic dysfunction of thoracic region: Secondary | ICD-10-CM | POA: Diagnosis not present

## 2018-11-30 DIAGNOSIS — M9901 Segmental and somatic dysfunction of cervical region: Secondary | ICD-10-CM | POA: Diagnosis not present

## 2018-12-01 ENCOUNTER — Ambulatory Visit (HOSPITAL_COMMUNITY): Payer: PPO | Attending: Physical Therapy | Admitting: Physical Therapy

## 2018-12-02 ENCOUNTER — Telehealth (HOSPITAL_COMMUNITY): Payer: Self-pay | Admitting: Physical Therapy

## 2018-12-02 DIAGNOSIS — M9902 Segmental and somatic dysfunction of thoracic region: Secondary | ICD-10-CM | POA: Diagnosis not present

## 2018-12-02 DIAGNOSIS — M9901 Segmental and somatic dysfunction of cervical region: Secondary | ICD-10-CM | POA: Diagnosis not present

## 2018-12-02 DIAGNOSIS — M9903 Segmental and somatic dysfunction of lumbar region: Secondary | ICD-10-CM | POA: Diagnosis not present

## 2018-12-02 NOTE — Telephone Encounter (Signed)
Pt has been scheduled twice - he seems confused and not sure what he wants to do. He has been scheduled on 10/5  Called and cx; then called and  scheduled again for 10/7 on 10/1. Pt called on 11/29/2018 several times to confirm his apptment and said he would be here.

## 2018-12-08 DIAGNOSIS — M9901 Segmental and somatic dysfunction of cervical region: Secondary | ICD-10-CM | POA: Diagnosis not present

## 2018-12-08 DIAGNOSIS — M9903 Segmental and somatic dysfunction of lumbar region: Secondary | ICD-10-CM | POA: Diagnosis not present

## 2018-12-08 DIAGNOSIS — M9902 Segmental and somatic dysfunction of thoracic region: Secondary | ICD-10-CM | POA: Diagnosis not present

## 2018-12-09 ENCOUNTER — Encounter: Payer: Self-pay | Admitting: Orthopaedic Surgery

## 2018-12-09 NOTE — Telephone Encounter (Signed)
He needs monovisc for his next appt.  Thanks.

## 2018-12-10 NOTE — Telephone Encounter (Signed)
Noted  

## 2018-12-13 ENCOUNTER — Other Ambulatory Visit: Payer: Self-pay | Admitting: Cardiovascular Disease

## 2018-12-13 ENCOUNTER — Ambulatory Visit (HOSPITAL_COMMUNITY): Payer: PPO | Admitting: Physical Therapy

## 2018-12-13 ENCOUNTER — Other Ambulatory Visit: Payer: Self-pay

## 2018-12-13 DIAGNOSIS — E0849 Diabetes mellitus due to underlying condition with other diabetic neurological complication: Secondary | ICD-10-CM

## 2018-12-13 DIAGNOSIS — Z794 Long term (current) use of insulin: Secondary | ICD-10-CM

## 2018-12-13 MED ORDER — BD PEN NEEDLE SHORT U/F 31G X 8 MM MISC: 1.0000 | Freq: Three times a day (TID) | 2 refills | Status: DC

## 2018-12-15 ENCOUNTER — Ambulatory Visit: Payer: PPO | Admitting: Internal Medicine

## 2018-12-22 DIAGNOSIS — Z794 Long term (current) use of insulin: Secondary | ICD-10-CM | POA: Diagnosis not present

## 2018-12-22 DIAGNOSIS — R55 Syncope and collapse: Secondary | ICD-10-CM | POA: Diagnosis not present

## 2018-12-22 DIAGNOSIS — Z6836 Body mass index (BMI) 36.0-36.9, adult: Secondary | ICD-10-CM | POA: Diagnosis not present

## 2018-12-22 DIAGNOSIS — B20 Human immunodeficiency virus [HIV] disease: Secondary | ICD-10-CM | POA: Diagnosis not present

## 2018-12-22 DIAGNOSIS — E876 Hypokalemia: Secondary | ICD-10-CM | POA: Diagnosis not present

## 2018-12-22 DIAGNOSIS — E0849 Diabetes mellitus due to underlying condition with other diabetic neurological complication: Secondary | ICD-10-CM | POA: Diagnosis not present

## 2018-12-22 DIAGNOSIS — I10 Essential (primary) hypertension: Secondary | ICD-10-CM | POA: Diagnosis not present

## 2018-12-22 DIAGNOSIS — N179 Acute kidney failure, unspecified: Secondary | ICD-10-CM | POA: Diagnosis not present

## 2019-01-04 ENCOUNTER — Other Ambulatory Visit: Payer: PPO

## 2019-01-11 ENCOUNTER — Ambulatory Visit: Payer: PPO | Admitting: Orthopaedic Surgery

## 2019-01-12 ENCOUNTER — Ambulatory Visit: Payer: PPO | Admitting: Endocrinology

## 2019-01-12 ENCOUNTER — Ambulatory Visit: Payer: PPO | Admitting: Family Medicine

## 2019-01-13 ENCOUNTER — Encounter: Payer: Self-pay | Admitting: Family Medicine

## 2019-01-19 ENCOUNTER — Ambulatory Visit: Payer: PPO | Admitting: Internal Medicine

## 2019-01-25 ENCOUNTER — Ambulatory Visit: Payer: PPO | Admitting: Endocrinology

## 2019-01-26 ENCOUNTER — Other Ambulatory Visit: Payer: PPO

## 2019-01-28 ENCOUNTER — Other Ambulatory Visit: Payer: Self-pay

## 2019-02-01 ENCOUNTER — Encounter: Payer: Self-pay | Admitting: Endocrinology

## 2019-02-01 ENCOUNTER — Other Ambulatory Visit: Payer: PPO

## 2019-02-01 ENCOUNTER — Ambulatory Visit (INDEPENDENT_AMBULATORY_CARE_PROVIDER_SITE_OTHER): Payer: PPO | Admitting: Endocrinology

## 2019-02-01 ENCOUNTER — Other Ambulatory Visit (HOSPITAL_COMMUNITY)
Admission: RE | Admit: 2019-02-01 | Discharge: 2019-02-01 | Disposition: A | Discharge status: Home / Self Care | Payer: PPO | Source: Ambulatory Visit | Attending: Internal Medicine | Admitting: Internal Medicine

## 2019-02-01 ENCOUNTER — Other Ambulatory Visit: Payer: Self-pay

## 2019-02-01 VITALS — BP 144/80 | HR 100 | Ht 71.0 in | Wt 264.4 lb

## 2019-02-01 DIAGNOSIS — R609 Edema, unspecified: Secondary | ICD-10-CM | POA: Diagnosis not present

## 2019-02-01 DIAGNOSIS — B20 Human immunodeficiency virus [HIV] disease: Secondary | ICD-10-CM

## 2019-02-01 DIAGNOSIS — E0849 Diabetes mellitus due to underlying condition with other diabetic neurological complication: Secondary | ICD-10-CM

## 2019-02-01 DIAGNOSIS — I10 Essential (primary) hypertension: Secondary | ICD-10-CM

## 2019-02-01 DIAGNOSIS — E1151 Type 2 diabetes mellitus with diabetic peripheral angiopathy without gangrene: Secondary | ICD-10-CM

## 2019-02-01 DIAGNOSIS — Z794 Long term (current) use of insulin: Secondary | ICD-10-CM

## 2019-02-01 DIAGNOSIS — Z961 Presence of intraocular lens: Secondary | ICD-10-CM | POA: Diagnosis not present

## 2019-02-01 LAB — POCT GLYCOSYLATED HEMOGLOBIN (HGB A1C): Hemoglobin A1C: 7.5 % — AB (ref 4.0–5.6)

## 2019-02-01 MED ORDER — LANTUS SOLOSTAR 100 UNIT/ML ~~LOC~~ SOPN: 230.0000 [IU] | PEN_INJECTOR | SUBCUTANEOUS | 99 refills | Status: DC

## 2019-02-01 MED ORDER — CANAGLIFLOZIN 100 MG PO TABS: 100.0000 mg | ORAL_TABLET | Freq: Every day | ORAL | 11 refills | Status: DC

## 2019-02-01 NOTE — Progress Notes (Signed)
Subjective:    Patient ID: Daniel [name], male    DOB: 1956-01-24, 63 y.o.   MRN: [id number]  HPI Pt returns for f/u of diabetes mellitus: DM type: Insulin-requiring type 2.  Dx'ed: 0254 Complications: polyneuropathy, PAD, renal insufficiency, and foot ulcers.   Therapy: insulin since 2016. DKA: never Severe hypoglycemia: never.  Pancreatitis: never.   Other: he takes multiple daily injections, but basal insulin is emphasized, with improved results; he is retired.   Interval history: I reviewed continuous glucose monitor data.  Glucose varies from 70-300, but pt says it was as low as 53.  Hypoglycemia usually happens fasting before the evening meal.  pt states he seldom misses the insulin.  pt states he feels well in general.  He has not recently taken Lasix.  Past Medical History:  Diagnosis Date  . ADHD (attention deficit hyperactivity disorder)   . Anxiety   . Chronic kidney disease   . Clotting disorder ([place])   . Depression   . Diabetes mellitus without complication ([place])   . Diabetes mellitus, type II ([place])   . Dizziness 03/17/2015  . Essential hypertension 06/25/2018  . GERD (gastroesophageal reflux disease)   . HIV disease ([place])   . HIV infection ([place])   . Liver disease   . OSA (obstructive sleep apnea) 07/25/2015   Uses CPAP regularly  . Peripheral vascular disease ([place])   . Ulcer     Past Surgical History:  Procedure Laterality Date  . AMPUTATION Right 10/02/2017   Procedure: RIGHT TRANSMETATARSAL AMPUTATION;  Surgeon: Leandrew Koyanagi, MD;  Location: [place];  Service: Orthopedics;  Laterality: Right;  . SMALL INTESTINE SURGERY    . STOMACH SURGERY    . TOE AMPUTATION Right 08/2016   right great toe    Social History   Socioeconomic History  . Marital status: Single    Spouse name: Not on file  . Number of children: Not on file  . Years of education: Not on file  . Highest education level: Not on file  Occupational History    Comment: DISABILITY  Tobacco Use   . Smoking status: Former Smoker    Packs/day: 0.10    Years: 10.00    Pack years: 1.00    Types: Cigars    Quit date: 08/09/2014    Years since quitting: 4.4  . Smokeless tobacco: Never Used  Substance and Sexual Activity  . Alcohol use: No    Alcohol/week: 0.0 standard drinks  . Drug use: No  . Sexual activity: Not Currently    Partners: Male    Comment: pt. declined condoms  Other Topics Concern  . Not on file  Social History Narrative   Epworth Sleepiness Scale = 7 (as of 03/16/2015)   Social Determinants of Health   Financial Resource Strain:   . Difficulty of Paying Living Expenses: Not on file  Food Insecurity:   . Worried About Charity fundraiser in the Last Year: Not on file  . Ran Out of Food in the Last Year: Not on file  Transportation Needs:   . Lack of Transportation (Medical): Not on file  . Lack of Transportation (Non-Medical): Not on file  Physical Activity:   . Days of Exercise per Week: Not on file  . Minutes of Exercise per Session: Not on file  Stress:   . Feeling of Stress : Not on file  Social Connections:   . Frequency of Communication with Friends and Family: Not on file  .  Frequency of Social Gatherings with Friends and Family: Not on file  . Attends Religious Services: Not on file  . Active Member of Clubs or Organizations: Not on file  . Attends Archivist Meetings: Not on file  . Marital Status: Not on file  Intimate Partner Violence:   . Fear of Current or Ex-Partner: Not on file  . Emotionally Abused: Not on file  . Physically Abused: Not on file  . Sexually Abused: Not on file    Current Outpatient Medications on File Prior to Visit  Medication Sig Dispense Refill  . abacavir-dolutegravir-lamiVUDine (TRIUMEQ) 600-50-300 MG tablet Take 1 tablet by mouth daily. 30 tablet 4  . acetaminophen (TYLENOL) 500 MG tablet Take 1,000-1,500 mg by mouth every 8 (eight) hours as needed for moderate pain.     [name] ALPRAZolam (XANAX) 1 MG  tablet Take 1 mg by mouth 4 (four) times daily as needed for anxiety.     [name] amphetamine-dextroamphetamine (ADDERALL) 30 MG tablet Take 30 mg by mouth 3 (three) times daily.    . BELSOMRA 20 MG TABS TK 1 T PO HS    . Brexpiprazole (REXULTI) 1 MG TABS Take 1 mg by mouth at bedtime.    . calcium-vitamin D (OSCAL WITH D) 500-200 MG-UNIT tablet Take 1 tablet by mouth.    . Cholecalciferol (VITAMIN D3) 250 MCG (10000 UT) TABS Take by mouth.    . Continuous Blood Gluc Sensor (FREESTYLE LIBRE 14 DAY SENSOR) MISC AS DIRECTED EVERY 14 DAYS 6 each 3  . diclofenac sodium (VOLTAREN) 1 % GEL APPLY 2 GRAMS TOPICALLY TO EACH KNEE IN THE MORNING AND AT BEDTIME, APPLY 1 GRAM TO EACH KNEE IN THE AFTERNOON 300 g 0  . divalproex (DEPAKOTE ER) 500 MG 24 hr tablet TAKE 1 TABLET(500 MG) BY MOUTH AT BEDTIME 90 tablet 1  . glucose blood (FREESTYLE PRECISION NEO TEST) test strip Used to check blood sugars twice daily. 100 each 12  . insulin aspart (NOVOLOG FLEXPEN) 100 UNIT/ML FlexPen 15 units with breakfast, and 25 unit with supper 15 mL 11  . Insulin Pen Needle (B-D ULTRAFINE III SHORT PEN) 31G X 8 MM MISC 1 each by Other route 3 (three) times daily. Use to inject insulin TID; E11.9 90 each 2  . levothyroxine (SYNTHROID, LEVOTHROID) 50 MCG tablet Take 1 tablet (50 mcg total) by mouth at bedtime. 30 tablet 11  . mupirocin ointment (BACTROBAN) 2 % Place 1 application into the nose 2 (two) times daily. 22 g 3  . ondansetron (ZOFRAN) 8 MG tablet Take 8 mg by mouth every 8 (eight) hours as needed for nausea or vomiting.    . pantoprazole (PROTONIX) 40 MG tablet TAKE 1 TABLET(40 MG) BY MOUTH DAILY AT 6 AM 90 tablet 1  . polyethylene glycol (MIRALAX / GLYCOLAX) packet Take 17 g by mouth 2 (two) times daily. 30 each 0  . pregabalin (LYRICA) 150 MG capsule Take 1 capsule (150 mg total) by mouth 3 (three) times daily. 270 capsule 1  . protriptyline (VIVACTIL) 10 MG tablet Take 10 mg by mouth 3 (three) times daily.   11  .  rivaroxaban (XARELTO) 20 MG TABS tablet TAKE 1 TABLET(20 MG) BY MOUTH DAILY 90 tablet 1  . tiZANidine (ZANAFLEX) 4 MG tablet TAKE 1 TABLET(4 MG) BY MOUTH EVERY 6 HOURS AS NEEDED FOR MUSCLE SPASMS. DO NOT REFILL IN LESS THAN 30 DAYS 20 tablet 2  . UNABLE TO FIND CPAP MACHINE with standard Aclaim nasal mask with  humidifier. Set at 14 cwp (Patient taking differently: CPAP MACHINE with standard Aclaim nasal mask with humidifier. Set at 4 cwp) 1 each 0  . Vortioxetine HBr (TRINTELLIX PO) Take 25 mg by mouth at bedtime.     [name] zolpidem (AMBIEN) 10 MG tablet Take 10 mg by mouth at bedtime.     [name] ezetimibe (ZETIA) 10 MG tablet Take 1 tablet (10 mg total) by mouth daily. 90 tablet 3   No current facility-administered medications on file prior to visit.    Allergies  Allergen Reactions  . Aspirin Swelling  . Ibuprofen Swelling  . Sustiva [Efavirenz] Swelling and Rash  . Nsaids Other (See Comments)    unknwn  . Lipitor [Atorvastatin Calcium] Other (See Comments)    Leg pain    Family History  Problem Relation Age of Onset  . Depression Brother   . Throat cancer Brother        half brother, never smoker  . COPD Mother   . Diabetes Neg Hx     BP (!) 144/80 (BP Location: Left Arm, Patient Position: Sitting, Cuff Size: Large)   Pulse 100   Ht 5' 11"  (1.803 m)   Wt 264 lb 6.4 oz (119.9 kg)   SpO2 97%   BMI 36.88 kg/m    Review of Systems Denies LOC    Objective:   Physical Exam VITAL SIGNS:  See vs page GENERAL: no distress   Pulses: left foot pulses are intact.    MSK: no deformity of the feet or ankles, except for right transmetatarsal amputation.   CV: 1+ bilat edema of the legs.   Skin:  normal color and temp on the feet and ankles.  No erythema.   Neuro: sensation is intact to touch on the feet and ankles, but severely decreased from normal.   There is onychomycosis of the left foot toenails.    Lab Results  Component Value Date   HGBA1C 7.5 (A) 02/01/2019   Lab Results   Component Value Date   CREATININE 1.58 (H) 11/17/2018   BUN 21 11/17/2018   NA 137 11/17/2018   K 4.4 11/17/2018   CL 98 11/17/2018   CO2 24 11/17/2018       Assessment & Plan:  HTN: is noted today Insulin-requiring type 2 DM, with PAD Edema: This limits rx options Hypoglycemia: this limits aggressiveness of glycemic control.  Therefore, we'll reduce insulin and add oral rx.    Patient Instructions  Your blood pressure is high today.  Please see your primary care provider soon, to have it rechecked Please reduce the Lantus to 230 units each morning, and:  I have sent a prescription to your pharmacy, to add "Invokana."  check your blood sugar twice a day.  vary the time of day when you check, between before the 3 meals, and at bedtime.  also check if you have symptoms of your blood sugar being too high or too low.  please keep a record of the readings and bring it to your next appointment here (or you can bring the meter itself).  You can write it on any piece of paper.  please call us sooner if your blood sugar goes below 70, or if you have a lot of readings over 200.   Please come back for a follow-up appointment in 2 months.

## 2019-02-01 NOTE — Progress Notes (Signed)
   Subjective:    Patient ID: Daniel [name], male    DOB: August 09, 1955, 63 y.o.   MRN: [id number]  HPI Pt returns for f/u of diabetes mellitus: DM type: Insulin-requiring type 2.  Dx'ed: 1884 Complications: polyneuropathy, PAD, renal insufficiency, and foot ulcers.   Therapy: insulin since 2016. DKA: never Severe hypoglycemia: never.  Pancreatitis: never.   Other: he takes multiple daily injections, but basal insulin is emphasized, with improved results; he is retired.   Interval history: I reviewed continuous glucose monitor data.  Glucose varies from 75-370, but pt says it was as low as 53.  Hypoglycemia usually happens fasting before the evening meal.  pt states he seldom misses the insulin.  pt states he feels well in general.     Review of Systems Denies LOC    Objective:   Physical Exam VITAL SIGNS:  See vs page GENERAL: no distress   Pulses: left foot pulses are intact.    MSK: no deformity of the feet or ankles, except for right transmetatarsal amputation.   CV: 1+ bilat edema of the legs.   Skin:  normal color and temp on the feet and ankles.  No erythema.   Neuro: sensation is intact to touch on the feet and ankles, but severely decreased from normal.   There is onychomycosis of the left foot toenails.    Lab Results  Component Value Date   HGBA1C 7.5 (A) 02/01/2019        Assessment & Plan:

## 2019-02-01 NOTE — Patient Instructions (Addendum)
Your blood pressure is high today.  Please see your primary care provider soon, to have it rechecked Please reduce the Lantus to 230 units each morning, and:  I have sent a prescription to your pharmacy, to add "Invokana."  check your blood sugar twice a day.  vary the time of day when you check, between before the 3 meals, and at bedtime.  also check if you have symptoms of your blood sugar being too high or too low.  please keep a record of the readings and bring it to your next appointment here (or you can bring the meter itself).  You can write it on any piece of paper.  please call us sooner if your blood sugar goes below 70, or if you have a lot of readings over 200.   Please come back for a follow-up appointment in 2 months.

## 2019-02-01 NOTE — Addendum Note (Signed)
Addended byMeriel Pica F on: 02/01/2019 02:33 PM   Modules accepted: Orders

## 2019-02-02 LAB — URINE CYTOLOGY ANCILLARY ONLY
Chlamydia: NEGATIVE
Comment: NEGATIVE
Comment: NORMAL
Neisseria Gonorrhea: NEGATIVE

## 2019-02-02 LAB — T-HELPER CELL (CD4) - (RCID CLINIC ONLY)
CD4 % Helper T Cell: 34 % (ref 33–65)
CD4 T Cell Abs: 891 /uL (ref 400–1790)

## 2019-02-09 ENCOUNTER — Encounter: Payer: Self-pay | Admitting: Internal Medicine

## 2019-02-09 ENCOUNTER — Ambulatory Visit (INDEPENDENT_AMBULATORY_CARE_PROVIDER_SITE_OTHER): Payer: PPO | Admitting: Internal Medicine

## 2019-02-09 ENCOUNTER — Other Ambulatory Visit: Payer: Self-pay

## 2019-02-09 VITALS — BP 125/80 | HR 81 | Wt 256.0 lb

## 2019-02-09 DIAGNOSIS — E119 Type 2 diabetes mellitus without complications: Secondary | ICD-10-CM | POA: Diagnosis not present

## 2019-02-09 DIAGNOSIS — B20 Human immunodeficiency virus [HIV] disease: Secondary | ICD-10-CM

## 2019-02-09 DIAGNOSIS — F322 Major depressive disorder, single episode, severe without psychotic features: Secondary | ICD-10-CM | POA: Diagnosis not present

## 2019-02-09 DIAGNOSIS — Z794 Long term (current) use of insulin: Secondary | ICD-10-CM

## 2019-02-09 LAB — POCT CBG (FASTING - GLUCOSE)-MANUAL ENTRY: Glucose Fasting, POC: 156 mg/dL — AB (ref 70–99)

## 2019-02-09 NOTE — Assessment & Plan Note (Signed)
No current PCP but will find one in [place].  I check ed his hgb A1c and is 7.5 and shared that with him.

## 2019-02-09 NOTE — Assessment & Plan Note (Signed)
This is an ongoing issue.  He is not interested in counseling.  Some of this is exacerbated by situational issues.  No SI or other concerns expressed. He knows he can call if he is interested in getting into counseling.

## 2019-02-09 NOTE — Assessment & Plan Note (Signed)
He is doing well.  Viral load not back but has been undetected for a long time.  No new issues and rtc in 6 months.

## 2019-02-09 NOTE — Progress Notes (Signed)
   Subjective:    Patient ID: Daniel [name], male    DOB: 09-Oct-1955, 63 y.o.   MRN: [id number]  HPI Here for follow up of HIV He continues on Triumeq and only one  Missed doses several months ago.  CD4 891, viral load still pending.  RPR negative.  No new issues.  Continued depression and particularly now that he has to care for his 69 yo demented aunt.  No si.    Review of Systems  Constitutional: Negative for chills, fatigue and fever.  Gastrointestinal: Negative for diarrhea.  Skin: Negative for rash.  Neurological: Negative for dizziness.       Objective:   Physical Exam Constitutional:      Appearance: He is well-developed.  Eyes:     General: No scleral icterus. Cardiovascular:     Rate and Rhythm: Normal rate and regular rhythm.     Heart sounds: Normal heart sounds. No murmur.  Pulmonary:     Effort: Pulmonary effort is normal. No respiratory distress.     Breath sounds: Normal breath sounds.  Skin:    Findings: No rash.  Neurological:     Mental Status: He is alert.  Psychiatric:        Mood and Affect: Mood normal.    SH: former smoker No sexual activity       Assessment & Plan:

## 2019-02-10 DIAGNOSIS — B351 Tinea unguium: Secondary | ICD-10-CM | POA: Diagnosis not present

## 2019-02-10 DIAGNOSIS — M79676 Pain in unspecified toe(s): Secondary | ICD-10-CM | POA: Diagnosis not present

## 2019-02-10 DIAGNOSIS — E1142 Type 2 diabetes mellitus with diabetic polyneuropathy: Secondary | ICD-10-CM | POA: Diagnosis not present

## 2019-02-10 DIAGNOSIS — L84 Corns and callosities: Secondary | ICD-10-CM | POA: Diagnosis not present

## 2019-02-12 LAB — HIV-1 RNA QUANT-NO REFLEX-BLD
HIV 1 RNA Quant: <20 copies/mL — AB
HIV-1 RNA Quant, Log: <1.3 Log copies/mL — AB

## 2019-02-12 LAB — RPR: RPR Ser Ql: NONREACTIVE

## 2019-02-28 ENCOUNTER — Telehealth: Payer: Self-pay

## 2019-02-28 NOTE — Telephone Encounter (Signed)
Unable to get in contact with the patient. LVM asking him to call our office so that we can convert his office visit into a mychart video visit. Office number was provided.    If patient calls back please convert his visit into a mychart visit with Jinny Blossom, NP.

## 2019-03-01 ENCOUNTER — Telehealth: Payer: Self-pay | Admitting: Neurology

## 2019-03-01 ENCOUNTER — Ambulatory Visit: Payer: PPO | Admitting: Adult Health

## 2019-03-01 NOTE — Telephone Encounter (Signed)
I called pt and made appt for him with AL/NP for cpap 04-19-19 1100 mychart VV. He will call mychart support if needed.

## 2019-03-01 NOTE — Telephone Encounter (Signed)
Patient arrived today for his cancelled apt with Hedwig Morton. Said he did not get the call.  Phone number has been confirmed. He would like a call back from the nurse to get worked in sooner than what is available past March. Best call back is [phone]

## 2019-03-02 ENCOUNTER — Telehealth: Payer: Self-pay

## 2019-03-02 NOTE — Telephone Encounter (Signed)
Submitted VOB for Monovisc, bilateral knee. 

## 2019-03-04 ENCOUNTER — Telehealth: Payer: Self-pay

## 2019-03-04 NOTE — Telephone Encounter (Signed)
Approved for Monovisc, bilateral knee. [place] Patient will be responsible for 20% OOP. No Co-pay No PA required  Appt. 03/15/2019 with [name]

## 2019-03-07 ENCOUNTER — Other Ambulatory Visit: Payer: Self-pay | Admitting: Internal Medicine

## 2019-03-07 ENCOUNTER — Other Ambulatory Visit: Payer: Self-pay

## 2019-03-07 DIAGNOSIS — Z794 Long term (current) use of insulin: Secondary | ICD-10-CM

## 2019-03-07 DIAGNOSIS — B20 Human immunodeficiency virus [HIV] disease: Secondary | ICD-10-CM

## 2019-03-07 DIAGNOSIS — E0849 Diabetes mellitus due to underlying condition with other diabetic neurological complication: Secondary | ICD-10-CM

## 2019-03-07 MED ORDER — BD PEN NEEDLE SHORT U/F 31G X 8 MM MISC: 1.0000 | Freq: Three times a day (TID) | 2 refills | Status: DC

## 2019-03-14 ENCOUNTER — Other Ambulatory Visit: Payer: Self-pay

## 2019-03-14 ENCOUNTER — Ambulatory Visit: Payer: PPO

## 2019-03-15 ENCOUNTER — Ambulatory Visit: Payer: PPO | Admitting: Orthopaedic Surgery

## 2019-03-16 ENCOUNTER — Encounter: Payer: Self-pay | Admitting: Internal Medicine

## 2019-03-17 ENCOUNTER — Telehealth: Payer: Self-pay | Admitting: *Deleted

## 2019-03-17 NOTE — Telephone Encounter (Signed)
I LMVM for pt that have openings sooner if he would like to MM/NP 03-21-19, several options. Please call back if interested.

## 2019-03-21 ENCOUNTER — Telehealth: Payer: Self-pay

## 2019-03-21 ENCOUNTER — Telehealth: Payer: PPO | Admitting: Adult Health

## 2019-03-21 NOTE — Telephone Encounter (Signed)
Called to see if the pt would like to be seen sooner today with MM. No Answer left VM.  If there is still a opening today please put him on with a NP whom works w/ CPAP pts.

## 2019-03-21 NOTE — Telephone Encounter (Signed)
Pt called back and accepted the mychart slot for 2:30 this afternoon with Megan. NP

## 2019-03-24 ENCOUNTER — Other Ambulatory Visit: Payer: Self-pay

## 2019-03-24 ENCOUNTER — Encounter: Payer: Self-pay | Admitting: Orthopaedic Surgery

## 2019-03-24 ENCOUNTER — Ambulatory Visit (INDEPENDENT_AMBULATORY_CARE_PROVIDER_SITE_OTHER): Payer: PPO | Admitting: Orthopaedic Surgery

## 2019-03-24 DIAGNOSIS — M170 Bilateral primary osteoarthritis of knee: Secondary | ICD-10-CM

## 2019-03-24 MED ORDER — HYALURONAN 88 MG/4ML IX SOSY
88.0000 mg | PREFILLED_SYRINGE | INTRA_ARTICULAR | Status: AC | PRN
  Administered 2019-03-24: 10:00:00 88 mg via INTRA_ARTICULAR

## 2019-03-24 MED ORDER — LIDOCAINE HCL 1 % IJ SOLN
3.0000 mL | INTRAMUSCULAR | Status: AC | PRN
  Administered 2019-03-24: 10:00:00 3 mL

## 2019-03-24 MED ORDER — BUPIVACAINE HCL 0.25 % IJ SOLN
0.6600 mL | INTRAMUSCULAR | Status: AC | PRN
  Administered 2019-03-24: 10:00:00 .66 mL via INTRA_ARTICULAR

## 2019-03-24 MED ORDER — LIDOCAINE HCL 1 % IJ SOLN
3.0000 mL | INTRAMUSCULAR | Status: AC | PRN
  Administered 2019-03-24: 3 mL

## 2019-03-24 MED ORDER — BUPIVACAINE HCL 0.25 % IJ SOLN
0.6600 mL | INTRAMUSCULAR | Status: AC | PRN
  Administered 2019-03-24: .66 mL via INTRA_ARTICULAR

## 2019-03-24 NOTE — Progress Notes (Signed)
   Procedure Note  Patient: Daniel [name]             Date of Birth: [birth year]           MRN: [id number]             Visit Date: 03/24/2019  Procedures: Visit Diagnoses:  1. Bilateral primary osteoarthritis of knee     Large Joint Inj: bilateral knee on 03/24/2019 9:39 AM Indications: pain Details: 22 G needle, anterolateral approach Medications (Right): 0.66 mL bupivacaine 0.25 %; 3 mL lidocaine 1 %; 88 mg Hyaluronan 88 MG/4ML Medications (Left): 0.66 mL bupivacaine 0.25 %; 3 mL lidocaine 1 %; 88 mg Hyaluronan 88 MG/4ML

## 2019-04-02 DIAGNOSIS — Z888 Allergy status to other drugs, medicaments and biological substances status: Secondary | ICD-10-CM | POA: Diagnosis not present

## 2019-04-02 DIAGNOSIS — Z20822 Contact with and (suspected) exposure to covid-19: Secondary | ICD-10-CM | POA: Diagnosis not present

## 2019-04-02 DIAGNOSIS — Z886 Allergy status to analgesic agent status: Secondary | ICD-10-CM | POA: Diagnosis not present

## 2019-04-04 ENCOUNTER — Other Ambulatory Visit: Payer: Self-pay

## 2019-04-04 ENCOUNTER — Other Ambulatory Visit: Payer: PPO

## 2019-04-06 ENCOUNTER — Other Ambulatory Visit: Payer: Self-pay | Admitting: Cardiovascular Disease

## 2019-04-06 ENCOUNTER — Other Ambulatory Visit: Payer: Self-pay | Admitting: Family Medicine

## 2019-04-06 ENCOUNTER — Ambulatory Visit: Payer: PPO | Admitting: Endocrinology

## 2019-04-06 DIAGNOSIS — E1142 Type 2 diabetes mellitus with diabetic polyneuropathy: Secondary | ICD-10-CM

## 2019-04-06 NOTE — Telephone Encounter (Signed)
Requested medication (s) are due for refill today yes  Requested medication (s) are on the active medication list yes  Future visit scheduled yes  Last refill: 10/04/18 1 RF  Notes to clinic: Patient request refill of non delegated Rx- sent for review of request  Requested Prescriptions  Pending Prescriptions Disp Refills   pregabalin (LYRICA) 150 MG capsule [Pharmacy Med Name: PREGABALIN 150MG CAPSULES] 270 capsule     Sig: TAKE 1 CAPSULE(150 MG) BY MOUTH THREE TIMES DAILY      Not Delegated - Neurology:  Anticonvulsants - Controlled Failed - 04/06/2019  4:16 PM      Failed - This refill cannot be delegated      Passed - Valid encounter within last 12 months    Recent Outpatient Visits           4 months ago Need for pneumococcal vaccination   [facility], Ranell Patrick, MD   6 months ago History of DVT (deep vein thrombosis)   [facility], Ranell Patrick, MD   1 year ago Chronic anticoagulation   [facility], Ranell Patrick, MD   1 year ago History of DVT (deep vein thrombosis)   [facility], Ranell Patrick, MD   1 year ago Weakness on right side of face   [facility], Ranell Patrick, MD                  Requested Prescriptions  Pending Prescriptions Disp Refills   pregabalin (LYRICA) 150 MG capsule [Pharmacy Med Name: PREGABALIN 150MG CAPSULES] 270 capsule     Sig: TAKE 1 CAPSULE(150 MG) BY MOUTH THREE TIMES DAILY      Not Delegated - Neurology:  Anticonvulsants - Controlled Failed - 04/06/2019  4:16 PM      Failed - This refill cannot be delegated      Passed - Valid encounter within last 12 months    Recent Outpatient Visits           4 months ago Need for pneumococcal vaccination   [facility], Ranell Patrick, MD   6 months ago History of DVT (deep vein thrombosis)   [facility], Ranell Patrick, MD   1 year ago Chronic anticoagulation   [facility], Ranell Patrick, MD   1 year ago History of DVT (deep vein thrombosis)   [facility], Ranell Patrick, MD   1 year ago Weakness on right side of face   [facility], Ranell Patrick, MD

## 2019-04-06 NOTE — Telephone Encounter (Signed)
Patient is requesting a refill of the following medications: Requested Prescriptions   Pending Prescriptions Disp Refills  . pregabalin (LYRICA) 150 MG capsule [Pharmacy Med Name: PREGABALIN 150MG CAPSULES] 270 capsule     Sig: TAKE 1 CAPSULE(150 MG) BY MOUTH THREE TIMES DAILY    Date of patient request: 04/06/2019 Last office visit:11/17/2018 Date of last refill: 10/04/2018 Last refill amount:270 capsules x 3 per day Follow up time period per chart: No follow up scheduled

## 2019-04-11 ENCOUNTER — Other Ambulatory Visit: Payer: Self-pay | Admitting: Family Medicine

## 2019-04-11 ENCOUNTER — Telehealth: Payer: Self-pay

## 2019-04-11 ENCOUNTER — Other Ambulatory Visit: Payer: Self-pay

## 2019-04-11 ENCOUNTER — Telehealth: Payer: Self-pay | Admitting: *Deleted

## 2019-04-11 NOTE — Telephone Encounter (Signed)
Schedule AWV.  

## 2019-04-11 NOTE — Telephone Encounter (Signed)
Received refill request for odansetron last filled 07/2017. Routing to provider for approval. Eugenia Mcalpine

## 2019-04-12 ENCOUNTER — Other Ambulatory Visit: Payer: Self-pay

## 2019-04-12 MED ORDER — ONDANSETRON HCL 8 MG PO TABS: 8.0000 mg | ORAL_TABLET | Freq: Three times a day (TID) | ORAL | 0 refills | Status: DC | PRN

## 2019-04-12 NOTE — Telephone Encounter (Signed)
Ok to refill 

## 2019-04-13 ENCOUNTER — Ambulatory Visit (INDEPENDENT_AMBULATORY_CARE_PROVIDER_SITE_OTHER): Payer: PPO | Admitting: Endocrinology

## 2019-04-13 ENCOUNTER — Ambulatory Visit: Payer: PPO | Admitting: Family Medicine

## 2019-04-13 ENCOUNTER — Other Ambulatory Visit: Payer: Self-pay

## 2019-04-13 ENCOUNTER — Encounter: Payer: Self-pay | Admitting: Endocrinology

## 2019-04-13 DIAGNOSIS — Z794 Long term (current) use of insulin: Secondary | ICD-10-CM | POA: Diagnosis not present

## 2019-04-13 DIAGNOSIS — E0849 Diabetes mellitus due to underlying condition with other diabetic neurological complication: Secondary | ICD-10-CM

## 2019-04-13 DIAGNOSIS — E1151 Type 2 diabetes mellitus with diabetic peripheral angiopathy without gangrene: Secondary | ICD-10-CM | POA: Diagnosis not present

## 2019-04-13 DIAGNOSIS — E1129 Type 2 diabetes mellitus with other diabetic kidney complication: Secondary | ICD-10-CM | POA: Diagnosis not present

## 2019-04-13 MED ORDER — LANTUS SOLOSTAR 100 UNIT/ML ~~LOC~~ SOPN: 200.0000 [IU] | PEN_INJECTOR | SUBCUTANEOUS | 99 refills | Status: DC

## 2019-04-13 NOTE — Progress Notes (Signed)
Subjective:    Patient ID: Daniel [name], male    DOB: February 03, 1956, 64 y.o.   MRN: [id number]  HPI  telehealth visit today via doxy video visit.  Alternatives to telehealth are presented to this patient, and the patient agrees to the telehealth visit. Pt is advised of the cost of the visit, and agrees to this, also.   Patient is at home, and I am at the office.   Persons attending the telehealth visit: the patient and I Pt returns for f/u of diabetes mellitus: DM type: Insulin-requiring type 2.  Dx'ed: 7035 Complications: polyneuropathy, PAD, renal insufficiency, and foot ulcers.   Therapy: insulin since 2016, and Invokana DKA: never Severe hypoglycemia: never.  Pancreatitis: never.   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   Interval history: pt says he reduced the Lantus to 220 units qam.  Despite the reduction, he has mild hypoglycemia approx 2-3 time per week.  Hypoglycemia usually happens fasting or before the evening meal.  pt states he seldom misses the insulin.  Past Medical History:  Diagnosis Date  . ADHD (attention deficit hyperactivity disorder)   . Anxiety   . Chronic kidney disease   . Clotting disorder ([place])   . Depression   . Diabetes mellitus without complication ([place])   . Diabetes mellitus, type II ([place])   . Dizziness 03/17/2015  . Essential hypertension 06/25/2018  . GERD (gastroesophageal reflux disease)   . HIV disease ([place])   . HIV infection ([place])   . Liver disease   . OSA (obstructive sleep apnea) 07/25/2015   Uses CPAP regularly  . Peripheral vascular disease ([place])   . Ulcer     Past Surgical History:  Procedure Laterality Date  . AMPUTATION Right 10/02/2017   Procedure: RIGHT TRANSMETATARSAL AMPUTATION;  Surgeon: Leandrew Koyanagi, MD;  Location: [place];  Service: Orthopedics;  Laterality: Right;  . SMALL  INTESTINE SURGERY    . STOMACH SURGERY    . TOE AMPUTATION Right 08/2016   right great toe    Social History   Socioeconomic History  . Marital status: Single    Spouse name: Not on file  . Number of children: Not on file  . Years of education: Not on file  . Highest education level: Not on file  Occupational History    Comment: DISABILITY  Tobacco Use  . Smoking status: Former Smoker    Packs/day: 0.10    Years: 10.00    Pack years: 1.00    Types: Cigars    Quit date: 08/09/2014    Years since quitting: 4.6  . Smokeless tobacco: Never Used  Substance and Sexual Activity  . Alcohol use: No    Alcohol/week: 0.0 standard drinks  . Drug use: No  . Sexual activity: Not Currently    Partners: Male    Comment: pt. declined condoms  Other Topics Concern  . Not on file  Social History Narrative   Epworth Sleepiness Scale = 7 (as of 03/16/2015)   Social Determinants of Health   Financial Resource Strain:   . Difficulty of Paying Living Expenses: Not on file  Food Insecurity:   . Worried About Charity fundraiser in the Last Year: Not on file  . Ran Out of Food in the Last Year: Not on file  Transportation Needs:   . Lack  of Transportation (Medical): Not on file  . Lack of Transportation (Non-Medical): Not on file  Physical Activity:   . Days of Exercise per Week: Not on file  . Minutes of Exercise per Session: Not on file  Stress:   . Feeling of Stress : Not on file  Social Connections:   . Frequency of Communication with Friends and Family: Not on file  . Frequency of Social Gatherings with Friends and Family: Not on file  . Attends Religious Services: Not on file  . Active Member of Clubs or Organizations: Not on file  . Attends Archivist Meetings: Not on file  . Marital Status: Not on file  Intimate Partner Violence:   . Fear of Current or Ex-Partner: Not on file  . Emotionally Abused: Not on file  . Physically Abused: Not on file  . Sexually Abused:  Not on file    Current Outpatient Medications on File Prior to Visit  Medication Sig Dispense Refill  . acetaminophen (TYLENOL) 500 MG tablet Take 1,000-1,500 mg by mouth every 8 (eight) hours as needed for moderate pain.     [name] ALPRAZolam (XANAX) 1 MG tablet Take 1 mg by mouth 4 (four) times daily as needed for anxiety.     [name] amLODipine (NORVASC) 5 MG tablet Take 5 mg by mouth daily.    [name] amphetamine-dextroamphetamine (ADDERALL) 30 MG tablet Take 30 mg by mouth 3 (three) times daily.    . BELSOMRA 20 MG TABS TK 1 T PO HS    . Brexpiprazole (REXULTI) 1 MG TABS Take 1 mg by mouth at bedtime.    . calcium-vitamin D (OSCAL WITH D) 500-200 MG-UNIT tablet Take 1 tablet by mouth.    . canagliflozin (INVOKANA) 100 MG TABS tablet Take 1 tablet (100 mg total) by mouth daily before breakfast. 30 tablet 11  . Cholecalciferol (VITAMIN D3) 250 MCG (10000 UT) TABS Take by mouth.    . Continuous Blood Gluc Sensor (FREESTYLE LIBRE 14 DAY SENSOR) MISC AS DIRECTED EVERY 14 DAYS 6 each 3  . diclofenac Sodium (VOLTAREN) 1 % GEL APPLY 2 GRAMS TOPICALLY TO EACH KNEE IN THE MORNING AND AT BEDTIME, APPLY 1 GRAM TO EACH KNEE IN THE AFTERNOON 300 g 0  . divalproex (DEPAKOTE ER) 500 MG 24 hr tablet TAKE 1 TABLET(500 MG) BY MOUTH AT BEDTIME 90 tablet 1  . ezetimibe (ZETIA) 10 MG tablet TAKE 1 TABLET(10 MG) BY MOUTH DAILY 90 tablet 1  . furosemide (LASIX) 40 MG tablet TAKE 1 TABLET(40 MG) BY MOUTH DAILY 90 tablet 1  . glucose blood (FREESTYLE PRECISION NEO TEST) test strip Used to check blood sugars twice daily. 100 each 12  . insulin aspart (NOVOLOG FLEXPEN) 100 UNIT/ML FlexPen 15 units with breakfast, and 25 unit with supper 15 mL 11  . Insulin Pen Needle (B-D ULTRAFINE III SHORT PEN) 31G X 8 MM MISC 1 each by Other route 3 (three) times daily. Use to inject insulin TID; E11.9 90 each 2  . levothyroxine (SYNTHROID, LEVOTHROID) 50 MCG tablet Take 1 tablet (50 mcg total) by mouth at bedtime. 30 tablet 11  . mupirocin  ointment (BACTROBAN) 2 % Place 1 application into the nose 2 (two) times daily. 22 g 3  . ondansetron (ZOFRAN) 8 MG tablet Take 1 tablet (8 mg total) by mouth every 8 (eight) hours as needed for nausea or vomiting. 20 tablet 0  . pantoprazole (PROTONIX) 40 MG tablet TAKE 1 TABLET(40 MG) BY MOUTH DAILY AT 6  AM 90 tablet 1  . polyethylene glycol (MIRALAX / GLYCOLAX) packet Take 17 g by mouth 2 (two) times daily. 30 each 0  . pregabalin (LYRICA) 150 MG capsule TAKE 1 CAPSULE(150 MG) BY MOUTH THREE TIMES DAILY 270 capsule 0  . protriptyline (VIVACTIL) 10 MG tablet Take 10 mg by mouth 3 (three) times daily.   11  . rivaroxaban (XARELTO) 20 MG TABS tablet TAKE 1 TABLET(20 MG) BY MOUTH DAILY 90 tablet 1  . tiZANidine (ZANAFLEX) 4 MG tablet TAKE 1 TABLET(4 MG) BY MOUTH EVERY 6 HOURS AS NEEDED FOR MUSCLE SPASMS. DO NOT REFILL IN LESS THAN 30 DAYS 20 tablet 2  . TRIUMEQ 600-50-300 MG tablet TAKE 1 TABLET BY MOUTH DAILY 30 tablet 4  . UNABLE TO FIND CPAP MACHINE with standard Aclaim nasal mask with humidifier. Set at 14 cwp (Patient taking differently: CPAP MACHINE with standard Aclaim nasal mask with humidifier. Set at 4 cwp) 1 each 0  . Vortioxetine HBr (TRINTELLIX PO) Take 25 mg by mouth at bedtime.     [name] zolpidem (AMBIEN) 10 MG tablet Take 10 mg by mouth at bedtime.      No current facility-administered medications on file prior to visit.    Allergies  Allergen Reactions  . Aspirin Swelling  . Ibuprofen Swelling  . Sustiva [Efavirenz] Swelling and Rash  . Nsaids Other (See Comments)    unknwn  . Lipitor [Atorvastatin Calcium] Other (See Comments)    Leg pain    Family History  Problem Relation Age of Onset  . Depression Brother   . Throat cancer Brother        half brother, never smoker  . COPD Mother   . Diabetes Neg Hx     There were no vitals taken for this visit.   Review of Systems Denies LOC    Objective:   Physical Exam   Lab Results  Component Value Date   CREATININE  1.58 (H) 11/17/2018   BUN 21 11/17/2018   NA 137 11/17/2018   K 4.4 11/17/2018   CL 98 11/17/2018   CO2 24 11/17/2018       Assessment & Plan:  Insulin-requiring type 2 DM, with PAD: overcontrolled Hypoglycemia, worse: this limits aggressiveness of glycemic control Renal insuff: he is at risk for further fasting hypoglycemia.   Patient Instructions  Please reduce the Lantus to 200 units each morning, and:  Please continue the same other diabetes medications.   check your blood sugar twice a day.  vary the time of day when you check, between before the 3 meals, and at bedtime.  also check if you have symptoms of your blood sugar being too high or too low.  please keep a record of the readings and bring it to your next appointment here (or you can bring the meter itself).  You can write it on any piece of paper.  please call us sooner if your blood sugar goes below 70, or if you have a lot of readings over 200.   Please come back for a follow-up appointment in 1 month.

## 2019-04-13 NOTE — Patient Instructions (Addendum)
Please reduce the Lantus to 200 units each morning, and:  Please continue the same other diabetes medications.   check your blood sugar twice a day.  vary the time of day when you check, between before the 3 meals, and at bedtime.  also check if you have symptoms of your blood sugar being too high or too low.  please keep a record of the readings and bring it to your next appointment here (or you can bring the meter itself).  You can write it on any piece of paper.  please call us sooner if your blood sugar goes below 70, or if you have a lot of readings over 200.   Please come back for a follow-up appointment in 1 month.

## 2019-04-14 DIAGNOSIS — G4733 Obstructive sleep apnea (adult) (pediatric): Secondary | ICD-10-CM | POA: Diagnosis not present

## 2019-04-19 ENCOUNTER — Telehealth: Payer: Self-pay | Admitting: Family Medicine

## 2019-04-20 ENCOUNTER — Ambulatory Visit: Payer: Self-pay

## 2019-04-21 ENCOUNTER — Ambulatory Visit (INDEPENDENT_AMBULATORY_CARE_PROVIDER_SITE_OTHER): Payer: PPO | Admitting: Emergency Medicine

## 2019-04-21 VITALS — BP 125/80 | Ht 71.0 in | Wt 256.0 lb

## 2019-04-21 DIAGNOSIS — Z0000 Encounter for general adult medical examination without abnormal findings: Secondary | ICD-10-CM | POA: Diagnosis not present

## 2019-04-21 NOTE — Progress Notes (Signed)
Presents today for [employer] Visit   Date of last exam: 10/04/2018  Interpreter used for this visit?  No  I connected with  Aikam Vinje Waldridge on 04/21/19 by a telephone  and verified that I am speaking with the correct person using two identifiers.   I discussed the limitations of evaluation and management by telemedicine. The patient expressed understanding and agreed to proceed.   Patient Care Team: [name], MD as PCP - General (Family Medicine) Comer, Okey Regal, MD as PCP - Infectious Diseases (Infectious Diseases) Skeet Latch, MD as PCP - Cardiology (Cardiology) [name], MD (Inactive) as PCP - Gastroenterology (Gastroenterology) Landis Martins, DPM as Consulting Physician (Podiatry) Renato Shin, MD as Consulting Physician (Endocrinology) Bettina Gavia, [place] (Optometry) [name], MD as Consulting Physician (Cardiology) Chucky May, MD as Consulting Physician (Psychiatry) Corliss Parish, MD as Consulting Physician (Nephrology) Renette Butters, MD as Attending Physician (Orthopedic Surgery) Marcial Pacas, MD as Consulting Physician (Neurology) Dohmeier, Asencion Partridge, MD as Consulting Physician (Neurology) Rutherford Guys, MD as Consulting Physician (Ophthalmology)   Other items to address today:  Discussed Eye/Dental Discussed immunizations Follow up scheduled 04-27-19 @ 3:20   Other Screening: Last screening for diabetes: 11/08/2018 Last lipid screening: 04/26/2018  ADVANCE DIRECTIVES: Discussed: yes On File: no Materials Provided: yes  Immunization status:  Immunization History  Administered Date(s) Administered  . Fluad Quad(high Dose 65+) 11/17/2018  . H1N1 03/09/2008  . Hepatitis A 09/09/2010, 05/29/2011  . Influenza Split 12/06/2010, 12/04/2011  . Influenza Whole 01/26/2004, 12/25/2005, 01/14/2007, 12/22/2007, 11/24/2009  . Influenza,inj,Quad PF,6+ Mos 11/04/2012, 11/07/2013, 01/10/2015, 11/12/2015,  11/27/2016, 11/16/2017  . Pneumococcal Conjugate-13 09/03/2006, 11/17/2018  . Pneumococcal Polysaccharide-23 03/12/2000, 05/29/2011, 04/29/2012  . Tdap 08/01/2014  . Zoster 11/08/2015  . Zoster Recombinat (Shingrix) 12/10/2016, 02/18/2017     Health Maintenance Due  Topic Date Due  . URINE MICROALBUMIN  06/05/2017  . FOOT EXAM  04/01/2019     Functional Status Survey: Is the patient deaf or have difficulty hearing?: No Does the patient have difficulty seeing, even when wearing glasses/contacts?: No Does the patient have difficulty concentrating, remembering, or making decisions?: No Does the patient have difficulty walking or climbing stairs?: No Does the patient have difficulty dressing or bathing?: No Does the patient have difficulty doing errands alone such as visiting a doctor's office or shopping?: No   6CIT Screen 04/21/2019  What Year? 0 points  What month? 0 points  What time? 0 points  Count back from 20 0 points  Months in reverse 0 points  Repeat phrase 2 points  Total Score 2        Clinical Support from 04/21/2019 in [facility] at [facility]  AUDIT-C Score  0       Home Environment:  Two story home Yes grab bars No scattered rugs  Can climb stairs  Adequate lighting/no clutter   Patient Active Problem List   Diagnosis Date Noted  . Severe major depression ([place]) 11/17/2018  . Class 2 obesity due to excess calories without serious comorbidity with body mass index (BMI) of 36.0 to 36.9 in adult 11/17/2018  . Diabetes ([place]) 11/09/2018  . Anticoagulated 08/24/2018  . Dyslipidemia 07/27/2018  . Essential hypertension 06/25/2018  . Hypogonadism in male 05/10/2018  . Numbness 03/31/2018  . Diabetic peripheral neuropathy ([place]) 01/25/2018  . History of syncope 01/25/2018  . S/P transmetatarsal amputation of foot, right ([place]) 10/09/2017  . Osteomyelitis ([place])   . Hypothyroidism   . Constipation   .  History of DVT (deep vein thrombosis) 09/30/2017  .  Morbid obesity due to excess calories ([place]) complicated by DM / hyperlipidemia 01/15/2017  . DOE (dyspnea on exertion) 01/13/2017  . Chronic migraine 09/17/2016  . Gait abnormality 09/17/2016  . Diabetic foot infection ([place]) 06/28/2016  . Fall 12/05/2015  . Toe ulcer, right ([place]) 09/19/2015  . Decreased pedal pulses 09/19/2015  . OSA on CPAP 09/05/2015  . Major depressive disorder, recurrent episode, moderate ([place]) 09/05/2015  . Low back pain 06/11/2015  . Abnormality of gait 06/11/2015  . Dizziness 03/17/2015  . Weakness 02/21/2015  . Chronic renal insufficiency, stage III (moderate) ([place]) 08/09/2014  . Insulin-requiring or dependent type II diabetes mellitus ([place]) 11/07/2013  . Hematuria 06/21/2013  . Hepatic steatosis 09/09/2010  . Human immunodeficiency virus (HIV) disease ([place]) 06/04/2006  . HERPES ZOSTER, UNCOMPLICATED 14/97/0263  . Depression 06/04/2006  . THROMBOPHLEBITIS NOS 06/04/2006  . GERD 06/04/2006  . ARTHRITIS, HAND 06/04/2006     Past Medical History:  Diagnosis Date  . ADHD (attention deficit hyperactivity disorder)   . Anxiety   . Chronic kidney disease   . Clotting disorder ([place])   . Depression   . Diabetes mellitus without complication ([place])   . Diabetes mellitus, type II ([place])   . Dizziness 03/17/2015  . Essential hypertension 06/25/2018  . GERD (gastroesophageal reflux disease)   . HIV disease ([place])   . HIV infection ([place])   . Liver disease   . OSA (obstructive sleep apnea) 07/25/2015   Uses CPAP regularly  . Peripheral vascular disease ([place])   . Ulcer      Past Surgical History:  Procedure Laterality Date  . AMPUTATION Right 10/02/2017   Procedure: RIGHT TRANSMETATARSAL AMPUTATION;  Surgeon: Leandrew Koyanagi, MD;  Location: [facility];  Service: Orthopedics;  Laterality: Right;  . SMALL INTESTINE SURGERY    . STOMACH SURGERY    . TOE AMPUTATION Right 08/2016   right great toe     Family History  Problem Relation Age of Onset  . Depression  Brother   . Throat cancer Brother        half brother, never smoker  . COPD Mother   . Diabetes Neg Hx      Social History   Socioeconomic History  . Marital status: Single    Spouse name: Not on file  . Number of children: Not on file  . Years of education: Not on file  . Highest education level: Not on file  Occupational History    Comment: DISABILITY  Tobacco Use  . Smoking status: Former Smoker    Packs/day: 0.10    Years: 10.00    Pack years: 1.00    Types: Cigars    Quit date: 08/09/2014    Years since quitting: 4.7  . Smokeless tobacco: Never Used  Substance and Sexual Activity  . Alcohol use: No    Alcohol/week: 0.0 standard drinks  . Drug use: No  . Sexual activity: Not Currently    Partners: Male    Comment: pt. declined condoms  Other Topics Concern  . Not on file  Social History Narrative   Epworth Sleepiness Scale = 7 (as of 03/16/2015)   Social Determinants of Health   Financial Resource Strain:   . Difficulty of Paying Living Expenses: Not on file  Food Insecurity:   . Worried About Charity fundraiser in the Last Year: Not on file  . Ran Out of Food in the Last Year: Not on  file  Transportation Needs:   . Film/video editor (Medical): Not on file  . Lack of Transportation (Non-Medical): Not on file  Physical Activity:   . Days of Exercise per Week: Not on file  . Minutes of Exercise per Session: Not on file  Stress:   . Feeling of Stress : Not on file  Social Connections:   . Frequency of Communication with Friends and Family: Not on file  . Frequency of Social Gatherings with Friends and Family: Not on file  . Attends Religious Services: Not on file  . Active Member of Clubs or Organizations: Not on file  . Attends Archivist Meetings: Not on file  . Marital Status: Not on file  Intimate Partner Violence:   . Fear of Current or Ex-Partner: Not on file  . Emotionally Abused: Not on file  . Physically Abused: Not on file  .  Sexually Abused: Not on file     Allergies  Allergen Reactions  . Aspirin Swelling  . Ibuprofen Swelling  . Sustiva [Efavirenz] Swelling and Rash  . Nsaids Other (See Comments)    unknwn  . Lipitor [Atorvastatin Calcium] Other (See Comments)    Leg pain     Prior to Admission medications   Medication Sig Start Date End Date Taking? Authorizing Provider  acetaminophen (TYLENOL) 500 MG tablet Take 1,000-1,500 mg by mouth every 8 (eight) hours as needed for moderate pain.    Yes [provider]  ALPRAZolam Duanne Moron) 1 MG tablet Take 1 mg by mouth 4 (four) times daily as needed for anxiety.    Yes [provider]  amLODipine (NORVASC) 5 MG tablet Take 5 mg by mouth daily. 02/07/19  Yes [provider]  amphetamine-dextroamphetamine (ADDERALL) 30 MG tablet Take 30 mg by mouth 3 (three) times daily.   Yes [provider]  BELSOMRA 20 MG TABS TK 1 T PO HS 11/15/18  Yes [provider]  Brexpiprazole (REXULTI) 1 MG TABS Take 1 mg by mouth at bedtime.   Yes [provider]  calcium-vitamin D (OSCAL WITH D) 500-200 MG-UNIT tablet Take 1 tablet by mouth.   Yes [provider]  canagliflozin (INVOKANA) 100 MG TABS tablet Take 1 tablet (100 mg total) by mouth daily before breakfast. 02/01/19  Yes Renato Shin, MD  Cholecalciferol (VITAMIN D3) 250 MCG (10000 UT) TABS Take by mouth.   Yes [provider]  Continuous Blood Gluc Sensor (FREESTYLE LIBRE 14 DAY SENSOR) MISC AS DIRECTED EVERY 14 DAYS 06/22/18  Yes Renato Shin, MD  diclofenac Sodium (VOLTAREN) 1 % GEL APPLY 2 GRAMS TOPICALLY TO EACH KNEE IN THE MORNING AND AT BEDTIME, APPLY 1 GRAM TO EACH KNEE IN THE AFTERNOON 04/11/19  Yes [name], MD  divalproex (DEPAKOTE ER) 500 MG 24 hr tablet TAKE 1 TABLET(500 MG) BY MOUTH AT BEDTIME 10/04/18  Yes [name], MD  ezetimibe (ZETIA) 10 MG tablet TAKE 1 TABLET(10 MG) BY MOUTH DAILY 04/06/19  Yes Skeet Latch, MD   furosemide (LASIX) 40 MG tablet TAKE 1 TABLET(40 MG) BY MOUTH DAILY 04/06/19  Yes Skeet Latch, MD  glucose blood (FREESTYLE PRECISION NEO TEST) test strip Used to check blood sugars twice daily. 07/28/17  Yes Renato Shin, MD  insulin aspart (NOVOLOG FLEXPEN) 100 UNIT/ML FlexPen 15 units with breakfast, and 25 unit with supper 11/08/18  Yes Renato Shin, MD  Insulin Glargine (LANTUS SOLOSTAR) 100 UNIT/ML Solostar Pen Inject 200 Units into the skin every morning. 04/13/19  Yes Renato Shin, MD  Insulin Pen Needle (B-D ULTRAFINE III SHORT PEN) 31G X 8 MM MISC 1 each by Other route 3 (three) times daily. Use to inject insulin TID; E11.9 03/07/19  Yes Renato Shin, MD  levothyroxine (SYNTHROID, LEVOTHROID) 50 MCG tablet Take 1 tablet (50 mcg total) by mouth at bedtime. 07/24/15  Yes [name], MD  ondansetron (ZOFRAN) 8 MG tablet Take 1 tablet (8 mg total) by mouth every 8 (eight) hours as needed for nausea or vomiting. 04/12/19  Yes Comer, Okey Regal, MD  pantoprazole (PROTONIX) 40 MG tablet TAKE 1 TABLET(40 MG) BY MOUTH DAILY AT 6 AM 10/04/18  Yes [name], MD  pregabalin (LYRICA) 150 MG capsule TAKE 1 CAPSULE(150 MG) BY MOUTH THREE TIMES DAILY 04/06/19  Yes [name], MD  protriptyline (VIVACTIL) 10 MG tablet Take 10 mg by mouth 3 (three) times daily.  01/30/16  Yes [provider]  rivaroxaban (XARELTO) 20 MG TABS tablet TAKE 1 TABLET(20 MG) BY MOUTH DAILY 10/04/18  Yes [name], MD  tiZANidine (ZANAFLEX) 4 MG tablet TAKE 1 TABLET(4 MG) BY MOUTH EVERY 6 HOURS AS NEEDED FOR MUSCLE SPASMS. DO NOT REFILL IN LESS THAN 30 DAYS 09/15/18  Yes Marcial Pacas, MD  TRIUMEQ 600-50-300 MG tablet TAKE 1 TABLET BY MOUTH DAILY 03/07/19  Yes Comer, Okey Regal, MD  UNABLE TO FIND CPAP MACHINE with standard Aclaim nasal mask with humidifier. Set at 14 cwp Patient taking differently: CPAP MACHINE with standard Aclaim nasal mask with humidifier. Set at 4 cwp 04/12/13  Yes Daub, Loura Back, MD   Vortioxetine HBr (TRINTELLIX PO) Take 25 mg by mouth at bedtime.  01/26/15  Yes [provider]  zolpidem (AMBIEN) 10 MG tablet Take 10 mg by mouth at bedtime.    Yes [provider]  mupirocin ointment (BACTROBAN) 2 % Place 1 application into the nose 2 (two) times daily. Patient not taking: Reported on 04/21/2019 06/10/18   Leandrew Koyanagi, MD  polyethylene glycol [facility] Floria Raveling) packet Take 17 g by mouth 2 (two) times daily. Patient not taking: Reported on 04/21/2019 10/05/17   Eugenie Filler, MD     Depression screen [facility] 2/9 02/09/2019 10/04/2018 03/24/2018 11/16/2017 11/16/2017  Decreased Interest 0 1 0 3 1  Down, Depressed, Hopeless 0 1 0 3 1  PHQ - 2 Score 0 2 0 6 2  Altered sleeping - 0 0 1 1  Tired, decreased energy - 1 0 0 1  Change in appetite - 1 0 3 1  Feeling bad or failure about yourself  - 2 0 0 1  Trouble concentrating - 0 0 0 1  Moving slowly or fidgety/restless - 0 0 0 1  Suicidal thoughts - 0 0 0 -  PHQ-9 Score - 6 0 10 8  Difficult doing work/chores - - Not difficult at all Not difficult at all -  Some recent data might be hidden     Fall Risk  02/09/2019 03/24/2018 11/16/2017 11/04/2017 07/09/2017  Falls in the past year? 0 1 No Yes No  Number falls in past yr: - 1 - 2 or more -  Injury with Fall? - 0 - - -  Comment - - - - -  Risk Factor Category  - - - - -  Risk for fall due to : Impaired balance/gait History of fall(s);Mental status change - - -  Follow up Falls evaluation completed Falls evaluation completed - - -  PHYSICAL EXAM: BP 125/80 Comment: taken from a previous visit  Ht 5' 11"  (1.803 m)   Wt 256 lb (116.1 kg)   BMI 35.70 kg/m    Wt Readings from Last 3 Encounters:  04/21/19 256 lb (116.1 kg)  02/09/19 256 lb (116.1 kg)  02/01/19 264 lb 6.4 oz (119.9 kg)      Education/Counseling provided regarding diet and exercise, prevention of chronic diseases, smoking/tobacco cessation, if applicable, and reviewed "Covered  Medicare Preventive Services."

## 2019-04-21 NOTE — Patient Instructions (Addendum)
Thank you for taking time to come for your Medicare Wellness Visit. I appreciate your ongoing commitment to your health goals. Please review the following plan we discussed and let me know if I can assist you in the future.  Daniel Kennedy LPN  Preventive Care 61-66 Years Old, Male Preventive care refers to lifestyle choices and visits with your health care provider that can promote health and wellness. This includes:  A yearly physical exam. This is also called an annual well check.  Regular dental and eye exams.  Immunizations.  Screening for certain conditions.  Healthy lifestyle choices, such as eating a healthy diet, getting regular exercise, not using drugs or products that contain nicotine and tobacco, and limiting alcohol use. What can I expect for my preventive care visit? Physical exam Your health care provider will check:  Height and weight. These may be used to calculate body mass index (BMI), which is a measurement that tells if you are at a healthy weight.  Heart rate and blood pressure.  Your skin for abnormal spots. Counseling Your health care provider may ask you questions about:  Alcohol, tobacco, and drug use.  Emotional well-being.  Home and relationship well-being.  Sexual activity.  Eating habits.  Work and work Statistician. What immunizations do I need?  Influenza (flu) vaccine  This is recommended every year. Tetanus, diphtheria, and pertussis (Tdap) vaccine  You may need a Td booster every 10 years. Varicella (chickenpox) vaccine  You may need this vaccine if you have not already been vaccinated. Zoster (shingles) vaccine  You may need this after age 96. Measles, mumps, and rubella (MMR) vaccine  You may need at least one dose of MMR if you were born in 1957 or later. You may also need a second dose. Pneumococcal conjugate (PCV13) vaccine  You may need this if you have certain conditions and were not previously vaccinated. Pneumococcal  polysaccharide (PPSV23) vaccine  You may need one or two doses if you smoke cigarettes or if you have certain conditions. Meningococcal conjugate (MenACWY) vaccine  You may need this if you have certain conditions. Hepatitis A vaccine  You may need this if you have certain conditions or if you travel or work in places where you may be exposed to hepatitis A. Hepatitis B vaccine  You may need this if you have certain conditions or if you travel or work in places where you may be exposed to hepatitis B. Haemophilus influenzae type b (Hib) vaccine  You may need this if you have certain risk factors. Human papillomavirus (HPV) vaccine  If recommended by your health care provider, you may need three doses over 6 months. You may receive vaccines as individual doses or as more than one vaccine together in one shot (combination vaccines). Talk with your health care provider about the risks and benefits of combination vaccines. What tests do I need? Blood tests  Lipid and cholesterol levels. These may be checked every 5 years, or more frequently if you are over 86 years old.  Hepatitis C test.  Hepatitis B test. Screening  Lung cancer screening. You may have this screening every year starting at age 44 if you have a 30-pack-year history of smoking and currently smoke or have quit within the past 15 years.  Prostate cancer screening. Recommendations will vary depending on your family history and other risks.  Colorectal cancer screening. All adults should have this screening starting at age 19 and continuing until age 83. Your health care provider may  recommend screening at age 78 if you are at increased risk. You will have tests every 1-10 years, depending on your results and the type of screening test.  Diabetes screening. This is done by checking your blood sugar (glucose) after you have not eaten for a while (fasting). You may have this done every 1-3 years.  Sexually transmitted  disease (STD) testing. Follow these instructions at home: Eating and drinking  Eat a diet that includes fresh fruits and vegetables, whole grains, lean protein, and low-fat dairy products.  Take vitamin and mineral supplements as recommended by your health care provider.  Do not drink alcohol if your health care provider tells you not to drink.  If you drink alcohol: ? Limit how much you have to 0-2 drinks a day. ? Be aware of how much alcohol is in your drink. In the [place] one drink equals one 12 oz bottle of beer (355 mL), one 5 oz glass of wine (148 mL), or one 1 oz glass of hard liquor (44 mL). Lifestyle  Take daily care of your teeth and gums.  Stay active. Exercise for at least 30 minutes on 5 or more days each week.  Do not use any products that contain nicotine or tobacco, such as cigarettes, e-cigarettes, and chewing tobacco. If you need help quitting, ask your health care provider.  If you are sexually active, practice safe sex. Use a condom or other form of protection to prevent STIs (sexually transmitted infections).  Talk with your health care provider about taking a low-dose aspirin every day starting at age 102. What's next?  Go to your health care provider once a year for a well check visit.  Ask your health care provider how often you should have your eyes and teeth checked.  Stay up to date on all vaccines. This information is not intended to replace advice given to you by your health care provider. Make sure you discuss any questions you have with your health care provider. Document Revised: 02/04/2018 Document Reviewed: 02/04/2018 Elsevier Patient Education  2020 [employer].

## 2019-04-27 ENCOUNTER — Ambulatory Visit: Payer: PPO | Admitting: Family Medicine

## 2019-05-05 ENCOUNTER — Other Ambulatory Visit: Payer: Self-pay | Admitting: Family Medicine

## 2019-05-05 DIAGNOSIS — Z8669 Personal history of other diseases of the nervous system and sense organs: Secondary | ICD-10-CM

## 2019-05-05 NOTE — Telephone Encounter (Signed)
Requested medication (s) are due for refill today: yes  Requested medication (s) are on the active medication list: yes  Last refill: 04/06/19  Future visit scheduled: yes  Notes to clinic:  not delegated    Requested Prescriptions  Pending Prescriptions Disp Refills   divalproex (DEPAKOTE ER) 500 MG 24 hr tablet [Pharmacy Med Name: DIVALPROEX EXTENDED RELEASE 500MG T] 90 tablet 1    Sig: TAKE 1 TABLET(500 MG) BY MOUTH AT BEDTIME      Not Delegated - Neurology:  Anticonvulsants - Valproates Failed - 05/05/2019  3:18 PM      Failed - This refill cannot be delegated      Failed - AST in normal range and within 360 days    AST  Date Value Ref Range Status  11/17/2018 93 (H) 0 - 40 IU/L Final          Failed - ALT in normal range and within 360 days    ALT  Date Value Ref Range Status  11/17/2018 79 (H) 0 - 44 IU/L Final          Failed - PLT in normal range and within 360 days    Platelets  Date Value Ref Range Status  11/17/2018 149 (L) 150 - 450 x10E3/uL Final   Platelet Count, POC  Date Value Ref Range Status  11/26/2015 161 142 - 424 K/uL Final          Failed - Valproic Acid (serum) in normal range and within 360 days    Valproic Acid Lvl  Date Value Ref Range Status  10/16/2017 18 (L) 50.0 - 100.0 ug/mL Final    Comment:    Performed at [facility], [address] [place], [place] [address]          Passed - HGB in normal range and within 360 days    Hemoglobin  Date Value Ref Range Status  11/17/2018 17.3 13.0 - 17.7 g/dL Final   HGB  Date Value Ref Range Status  05/19/2011 17.1 13.0 - 17.1 g/dL Final          Passed - WBC in normal range and within 360 days    WBC  Date Value Ref Range Status  11/17/2018 8.0 3.4 - 10.8 x10E3/uL Final  11/04/2017 8.6 3.8 - 10.8 Thousand/uL Final          Passed - HCT in normal range and within 360 days    HCT  Date Value Ref Range Status  05/19/2011 49.1 38.4 - 49.9 % Final   Hematocrit  Date Value  Ref Range Status  11/17/2018 50.0 37.5 - 51.0 % Final          Passed - Valid encounter within last 12 months    Recent Outpatient Visits           2 weeks ago Medicare annual wellness visit, subsequent   Primary Care at [facility], Ines Bloomer, MD   5 months ago Need for pneumococcal vaccination   Primary Care at [facility], Ranell Patrick, MD   7 months ago History of DVT (deep vein thrombosis)   Primary Care at [facility], Ranell Patrick, MD   1 year ago Chronic anticoagulation   Primary Care at [facility], Ranell Patrick, MD   1 year ago History of DVT (deep vein thrombosis)   Primary Care at [facility], Ranell Patrick, MD       Future Appointments  Tomorrow [name], MD Primary Care at [place], [facility]

## 2019-05-05 NOTE — Telephone Encounter (Signed)
Requested medication (s) are due for refill today - yes  Requested medication (s) are on the active medication list -yes  Future visit scheduled -yes  Last refill: 03/28/19  Notes to clinic: Patient is requesting Rx that has red alerts- sent for PCP review   Requested Prescriptions  Pending Prescriptions Disp Refills   diclofenac Sodium (VOLTAREN) 1 % GEL [Pharmacy Med Name: DICLOFENAC 1% GEL 100GM] 300 g 0    Sig: APPLY 2 GRAMS TOPICALLY TO EACH KNEE IN THE MORNING AND AT BEDTIME, APPLY 1 GRAM TO EACH KNEE IN THE AFTERNOON      Analgesics:  Topicals Passed - 05/05/2019  3:18 PM      Passed - Valid encounter within last 12 months    Recent Outpatient Visits           2 weeks ago Medicare annual wellness visit, subsequent   Primary Care at [facility], Ines Bloomer, MD   5 months ago Need for pneumococcal vaccination   Primary Care at [facility], Ranell Patrick, MD   7 months ago History of DVT (deep vein thrombosis)   Primary Care at [facility], Ranell Patrick, MD   1 year ago Chronic anticoagulation   Primary Care at [facility], Ranell Patrick, MD   1 year ago History of DVT (deep vein thrombosis)   Primary Care at [facility], Ranell Patrick, MD       Future Appointments             Tomorrow [name], MD Primary Care at [place], [facility]                Requested Prescriptions  Pending Prescriptions Disp Refills   diclofenac Sodium (VOLTAREN) 1 % GEL [Pharmacy Med Name: DICLOFENAC 1% GEL 100GM] 300 g 0    Sig: APPLY 2 GRAMS TOPICALLY TO EACH KNEE IN THE MORNING AND AT BEDTIME, APPLY 1 GRAM TO EACH KNEE IN THE AFTERNOON      Analgesics:  Topicals Passed - 05/05/2019  3:18 PM      Passed - Valid encounter within last 12 months    Recent Outpatient Visits           2 weeks ago Medicare annual wellness visit, subsequent   Primary Care at [facility], Ines Bloomer, MD   5 months ago Need for pneumococcal vaccination   Primary Care at [facility], Ranell Patrick, MD    7 months ago History of DVT (deep vein thrombosis)   Primary Care at [facility], Ranell Patrick, MD   1 year ago Chronic anticoagulation   Primary Care at [facility], Ranell Patrick, MD   1 year ago History of DVT (deep vein thrombosis)   Primary Care at [facility], Ranell Patrick, MD       Future Appointments             Tomorrow [name], MD Primary Care at [place], [facility]

## 2019-05-06 ENCOUNTER — Ambulatory Visit: Payer: PPO | Admitting: Family Medicine

## 2019-05-06 ENCOUNTER — Other Ambulatory Visit: Payer: Self-pay

## 2019-05-06 MED ORDER — ONDANSETRON HCL 8 MG PO TABS: 8.0000 mg | ORAL_TABLET | Freq: Three times a day (TID) | ORAL | 1 refills | Status: DC | PRN

## 2019-05-09 ENCOUNTER — Other Ambulatory Visit: Payer: Self-pay

## 2019-05-11 ENCOUNTER — Ambulatory Visit (INDEPENDENT_AMBULATORY_CARE_PROVIDER_SITE_OTHER): Payer: PPO | Admitting: Endocrinology

## 2019-05-11 ENCOUNTER — Other Ambulatory Visit: Payer: Self-pay

## 2019-05-11 ENCOUNTER — Encounter: Payer: Self-pay | Admitting: Endocrinology

## 2019-05-11 VITALS — BP 122/80 | HR 98 | Ht 71.0 in | Wt 251.0 lb

## 2019-05-11 DIAGNOSIS — N1831 Chronic kidney disease, stage 3a: Secondary | ICD-10-CM | POA: Diagnosis not present

## 2019-05-11 DIAGNOSIS — E1121 Type 2 diabetes mellitus with diabetic nephropathy: Secondary | ICD-10-CM

## 2019-05-11 DIAGNOSIS — Z794 Long term (current) use of insulin: Secondary | ICD-10-CM

## 2019-05-11 DIAGNOSIS — E1122 Type 2 diabetes mellitus with diabetic chronic kidney disease: Secondary | ICD-10-CM

## 2019-05-11 LAB — POCT GLYCOSYLATED HEMOGLOBIN (HGB A1C): Hemoglobin A1C: 7.2 % — AB (ref 4.0–5.6)

## 2019-05-11 MED ORDER — TRULICITY 0.75 MG/0.5ML ~~LOC~~ SOAJ: 0.7500 mg | SUBCUTANEOUS | 11 refills | Status: DC

## 2019-05-11 MED ORDER — LANTUS SOLOSTAR 100 UNIT/ML ~~LOC~~ SOPN: 170.0000 [IU] | PEN_INJECTOR | SUBCUTANEOUS | 99 refills | Status: DC

## 2019-05-11 NOTE — Patient Instructions (Addendum)
I have sent a prescription to your pharmacy, to add "Trulicity," and:  Please reduce the Lantus to 170 units each morning, and:  Please continue the same other diabetes medications.   check your blood sugar twice a day.  vary the time of day when you check, between before the 3 meals, and at bedtime.  also check if you have symptoms of your blood sugar being too high or too low.  please keep a record of the readings and bring it to your next appointment here (or you can bring the meter itself).  You can write it on any piece of paper.  please call us sooner if your blood sugar goes below 70, or if you have a lot of readings over 200.   Please come back for a follow-up appointment in 2 months.

## 2019-05-11 NOTE — Progress Notes (Signed)
Subjective:    Patient ID: Daniel [name], male    DOB: 02-27-1955, 64 y.o.   MRN: [id number]  HPI Pt returns for f/u of diabetes mellitus: DM type: Insulin-requiring type 2.  Dx'ed: 1884 Complications: PN, PAD, CRI, and foot ulcers.   Therapy: insulin since 2016, and Invokana DKA: never Severe hypoglycemia: never.  Pancreatitis: never.   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   Interval history: I reviewed continuous glucose monitor data.  Glucose varies from 50-260, but most are in the 100's.  Hypoglycemia happens fasting, when he does not eat a snack at HS.  pt states he seldom misses the insulin.  Past Medical History:  Diagnosis Date  . ADHD (attention deficit hyperactivity disorder)   . Anxiety   . Chronic kidney disease   . Clotting disorder ([place])   . Depression   . Diabetes mellitus without complication ([place])   . Diabetes mellitus, type II ([place])   . Dizziness 03/17/2015  . Essential hypertension 06/25/2018  . GERD (gastroesophageal reflux disease)   . HIV disease ([place])   . HIV infection ([place])   . Liver disease   . OSA (obstructive sleep apnea) 07/25/2015   Uses CPAP regularly  . Peripheral vascular disease ([place])   . Ulcer     Past Surgical History:  Procedure Laterality Date  . AMPUTATION Right 10/02/2017   Procedure: RIGHT TRANSMETATARSAL AMPUTATION;  Surgeon: Leandrew Koyanagi, MD;  Location: [facility];  Service: Orthopedics;  Laterality: Right;  . SMALL INTESTINE SURGERY    . STOMACH SURGERY    . TOE AMPUTATION Right 08/2016   right great toe    Social History   Socioeconomic History  . Marital status: Single    Spouse name: Not on file  . Number of children: Not on file  . Years of education: Not on file  . Highest education level: Not on file  Occupational History    Comment: DISABILITY  Tobacco Use  .  Smoking status: Former Smoker    Packs/day: 0.10    Years: 10.00    Pack years: 1.00    Types: Cigars    Quit date: 08/09/2014    Years since quitting: 4.7  . Smokeless tobacco: Never Used  Substance and Sexual Activity  . Alcohol use: No    Alcohol/week: 0.0 standard drinks  . Drug use: No  . Sexual activity: Not Currently    Partners: Male    Comment: pt. declined condoms  Other Topics Concern  . Not on file  Social History Narrative   Epworth Sleepiness Scale = 7 (as of 03/16/2015)   Social Determinants of Health   Financial Resource Strain:   . Difficulty of Paying Living Expenses:   Food Insecurity:   . Worried About Charity fundraiser in the Last Year:   . Arboriculturist in the Last Year:   Transportation Needs:   . Film/video editor (Medical):   [name] Lack of Transportation (Non-Medical):   Physical Activity:   . Days of Exercise per Week:   . Minutes of Exercise per Session:   Stress:   . Feeling of Stress :   Social Connections:   . Frequency of Communication with Friends and Family:   . Frequency of Social Gatherings with Friends and Family:   . Attends Religious Services:   .  Active Member of Clubs or Organizations:   . Attends Archivist Meetings:   [name] Marital Status:   Intimate Partner Violence:   . Fear of Current or Ex-Partner:   . Emotionally Abused:   [name] Physically Abused:   . Sexually Abused:     Current Outpatient Medications on File Prior to Visit  Medication Sig Dispense Refill  . acetaminophen (TYLENOL) 500 MG tablet Take 1,000-1,500 mg by mouth every 8 (eight) hours as needed for moderate pain.     [name] ALPRAZolam (XANAX) 1 MG tablet Take 1 mg by mouth 4 (four) times daily as needed for anxiety.     [name] amLODipine (NORVASC) 5 MG tablet Take 5 mg by mouth daily.    [name] amphetamine-dextroamphetamine (ADDERALL) 30 MG tablet Take 30 mg by mouth 3 (three) times daily.    . BELSOMRA 20 MG TABS TK 1 T PO HS    . Brexpiprazole (REXULTI) 1 MG  TABS Take 1 mg by mouth at bedtime.    . calcium-vitamin D (OSCAL WITH D) 500-200 MG-UNIT tablet Take 1 tablet by mouth.    . canagliflozin (INVOKANA) 100 MG TABS tablet Take 1 tablet (100 mg total) by mouth daily before breakfast. 30 tablet 11  . Cholecalciferol (VITAMIN D3) 250 MCG (10000 UT) TABS Take by mouth.    . Continuous Blood Gluc Sensor (FREESTYLE LIBRE 14 DAY SENSOR) MISC AS DIRECTED EVERY 14 DAYS 6 each 3  . diclofenac Sodium (VOLTAREN) 1 % GEL APPLY 2 GRAMS TOPICALLY TO EACH KNEE IN THE MORNING AND AT BEDTIME, APPLY 1 GRAM TO EACH KNEE IN THE AFTERNOON 300 g 0  . divalproex (DEPAKOTE ER) 500 MG 24 hr tablet TAKE 1 TABLET(500 MG) BY MOUTH AT BEDTIME 90 tablet 1  . ezetimibe (ZETIA) 10 MG tablet TAKE 1 TABLET(10 MG) BY MOUTH DAILY 90 tablet 1  . furosemide (LASIX) 40 MG tablet TAKE 1 TABLET(40 MG) BY MOUTH DAILY 90 tablet 1  . glucose blood (FREESTYLE PRECISION NEO TEST) test strip Used to check blood sugars twice daily. 100 each 12  . insulin aspart (NOVOLOG FLEXPEN) 100 UNIT/ML FlexPen 15 units with breakfast, and 25 unit with supper 15 mL 11  . Insulin Pen Needle (B-D ULTRAFINE III SHORT PEN) 31G X 8 MM MISC 1 each by Other route 3 (three) times daily. Use to inject insulin TID; E11.9 90 each 2  . levothyroxine (SYNTHROID, LEVOTHROID) 50 MCG tablet Take 1 tablet (50 mcg total) by mouth at bedtime. 30 tablet 11  . mupirocin ointment (BACTROBAN) 2 % Place 1 application into the nose 2 (two) times daily. 22 g 3  . ondansetron (ZOFRAN) 8 MG tablet Take 1 tablet (8 mg total) by mouth every 8 (eight) hours as needed for nausea or vomiting. 20 tablet 1  . pantoprazole (PROTONIX) 40 MG tablet TAKE 1 TABLET(40 MG) BY MOUTH DAILY AT 6 AM 90 tablet 1  . polyethylene glycol (MIRALAX / GLYCOLAX) packet Take 17 g by mouth 2 (two) times daily. 30 each 0  . pregabalin (LYRICA) 150 MG capsule TAKE 1 CAPSULE(150 MG) BY MOUTH THREE TIMES DAILY 270 capsule 0  . protriptyline (VIVACTIL) 10 MG tablet Take  10 mg by mouth 3 (three) times daily.   11  . rivaroxaban (XARELTO) 20 MG TABS tablet TAKE 1 TABLET(20 MG) BY MOUTH DAILY 90 tablet 1  . tiZANidine (ZANAFLEX) 4 MG tablet TAKE 1 TABLET(4 MG) BY MOUTH EVERY 6 HOURS AS NEEDED FOR MUSCLE SPASMS. DO NOT REFILL  IN LESS THAN 30 DAYS 20 tablet 2  . TRIUMEQ 600-50-300 MG tablet TAKE 1 TABLET BY MOUTH DAILY 30 tablet 4  . UNABLE TO FIND CPAP MACHINE with standard Aclaim nasal mask with humidifier. Set at 14 cwp (Patient taking differently: CPAP MACHINE with standard Aclaim nasal mask with humidifier. Set at 4 cwp) 1 each 0  . Vortioxetine HBr (TRINTELLIX PO) Take 25 mg by mouth at bedtime.     [name] zolpidem (AMBIEN) 10 MG tablet Take 10 mg by mouth at bedtime.      No current facility-administered medications on file prior to visit.    Allergies  Allergen Reactions  . Aspirin Swelling  . Ibuprofen Swelling  . Sustiva [Efavirenz] Swelling and Rash  . Nsaids Other (See Comments)    unknwn  . Lipitor [Atorvastatin Calcium] Other (See Comments)    Leg pain    Family History  Problem Relation Age of Onset  . Depression Brother   . Throat cancer Brother        half brother, never smoker  . COPD Mother   . Diabetes Neg Hx     BP 122/80   Pulse 98   Ht 5' 11"  (1.803 m)   Wt 251 lb (113.9 kg)   SpO2 98%   BMI 35.01 kg/m    Review of Systems Denies LOC    Objective:   Physical Exam VITAL SIGNS:  See vs page GENERAL: no distress Pulses: left foot pulses are intact.    MSK: no deformity of the feet or ankles, except for right transmetatarsal amputation.   CV: 1+ bilat edema of the legs.   Skin:  normal color and temp on the feet and ankles.  No erythema.  No ulcer, but there are bilat heavy calluses.  Neuro: sensation is intact to touch on the feet and ankles, but severely decreased from normal.   There is onychomycosis of the left foot toenails.   Lab Results  Component Value Date   HGBA1C 7.2 (A) 05/11/2019       Assessment &  Plan:  Insulin-requiring type 2 DM, with PAD: this is the best control this pt should aim for, given this regimen, which does match insulin to his changing needs throughout the day. Hypoglycemia: this limits aggressiveness of glycemic control.   Obesity: pt requests that rx decisions consider this.    Patient Instructions  I have sent a prescription to your pharmacy, to add "Trulicity," and:  Please reduce the Lantus to 170 units each morning, and:  Please continue the same other diabetes medications.   check your blood sugar twice a day.  vary the time of day when you check, between before the 3 meals, and at bedtime.  also check if you have symptoms of your blood sugar being too high or too low.  please keep a record of the readings and bring it to your next appointment here (or you can bring the meter itself).  You can write it on any piece of paper.  please call us sooner if your blood sugar goes below 70, or if you have a lot of readings over 200.   Please come back for a follow-up appointment in 2 months.

## 2019-05-19 ENCOUNTER — Other Ambulatory Visit: Payer: Self-pay

## 2019-05-19 ENCOUNTER — Encounter: Payer: Self-pay | Admitting: Family Medicine

## 2019-05-19 ENCOUNTER — Ambulatory Visit (INDEPENDENT_AMBULATORY_CARE_PROVIDER_SITE_OTHER): Payer: PPO

## 2019-05-19 ENCOUNTER — Ambulatory Visit (INDEPENDENT_AMBULATORY_CARE_PROVIDER_SITE_OTHER): Payer: PPO | Admitting: Family Medicine

## 2019-05-19 VITALS — BP 126/81 | HR 96 | Temp 98.5°F | Ht 71.0 in | Wt 246.0 lb

## 2019-05-19 DIAGNOSIS — E1142 Type 2 diabetes mellitus with diabetic polyneuropathy: Secondary | ICD-10-CM | POA: Diagnosis not present

## 2019-05-19 DIAGNOSIS — M5441 Lumbago with sciatica, right side: Secondary | ICD-10-CM | POA: Diagnosis not present

## 2019-05-19 DIAGNOSIS — I824Y9 Acute embolism and thrombosis of unspecified deep veins of unspecified proximal lower extremity: Secondary | ICD-10-CM

## 2019-05-19 DIAGNOSIS — I10 Essential (primary) hypertension: Secondary | ICD-10-CM

## 2019-05-19 DIAGNOSIS — F339 Major depressive disorder, recurrent, unspecified: Secondary | ICD-10-CM

## 2019-05-19 DIAGNOSIS — K219 Gastro-esophageal reflux disease without esophagitis: Secondary | ICD-10-CM | POA: Diagnosis not present

## 2019-05-19 DIAGNOSIS — Z7901 Long term (current) use of anticoagulants: Secondary | ICD-10-CM | POA: Diagnosis not present

## 2019-05-19 DIAGNOSIS — E785 Hyperlipidemia, unspecified: Secondary | ICD-10-CM

## 2019-05-19 DIAGNOSIS — M792 Neuralgia and neuritis, unspecified: Secondary | ICD-10-CM | POA: Diagnosis not present

## 2019-05-19 DIAGNOSIS — M545 Low back pain: Secondary | ICD-10-CM | POA: Diagnosis not present

## 2019-05-19 MED ORDER — PANTOPRAZOLE SODIUM 40 MG PO TBEC: DELAYED_RELEASE_TABLET | ORAL | 1 refills | Status: DC

## 2019-05-19 MED ORDER — EZETIMIBE 10 MG PO TABS: ORAL_TABLET | ORAL | 1 refills | Status: DC

## 2019-05-19 MED ORDER — AMLODIPINE BESYLATE 5 MG PO TABS: 5.0000 mg | ORAL_TABLET | Freq: Every day | ORAL | 1 refills | Status: DC

## 2019-05-19 MED ORDER — PREGABALIN 150 MG PO CAPS: 300.0000 mg | ORAL_CAPSULE | Freq: Two times a day (BID) | ORAL | 1 refills | Status: DC

## 2019-05-19 MED ORDER — FUROSEMIDE 40 MG PO TABS: ORAL_TABLET | ORAL | 1 refills | Status: DC

## 2019-05-19 MED ORDER — RIVAROXABAN 20 MG PO TABS: ORAL_TABLET | ORAL | 1 refills | Status: DC

## 2019-05-19 NOTE — Patient Instructions (Addendum)
   COVID-19 Vaccine Information can be found at: [email or web address] For questions related to vaccine distribution or appointments, please email [email or web address] or call [phone].   Call Dr. Toy Care to discuss the decreased appetite, sleeping more as I am concerned that your depression is worse.   Continue Protonix, I will refer you to gastroenterology for worsening reflux, I referred you to physical therapy for back pain, follow-up if any worsening pain or new symptoms.  I did authorize a higher dose of Lyrica for now. .  Recheck in the next 3 weeks, sooner if any worsening symptoms.   If you have lab work done today you will be contacted with your lab results within the next 2 weeks.  If you have not heard from [name] then please contact us. The fastest way to get your results is to register for My Chart.   IF you received an x-ray today, you will receive an invoice from [facility]. Please contact [facility] at [phone] with questions or concerns regarding your invoice.   IF you received labwork today, you will receive an invoice from [facility]. Please contact [facility] at [phone] with questions or concerns regarding your invoice.   Our billing staff will not be able to assist you with questions regarding bills from these companies.  You will be contacted with the lab results as soon as they are available. The fastest way to get your results is to activate your My Chart account. Instructions are located on the last page of this paperwork. If you have not heard from [name] regarding the results in 2 weeks, please contact this office.

## 2019-05-19 NOTE — Progress Notes (Signed)
Subjective:  Patient ID: Daniel [name], male    DOB: Dec 06, 1955  Age: 64 y.o. MRN: [id number]  CC:  Chief Complaint  Patient presents with  . Follow-up    hypertension and diabetes. pt state he don't know how his BP is doing he dosen't check it. pt dose check his BS daily and he states it ranges from 50-33m/dl.  . Back Pain    pain started 2 weeks ago. pt doesn't know what caused the pain. pain is localized to middle of back. pain is sharp when it hits. pt states it only hurts him if he is going up steps or making a similar motion with his legs.  . Depression    PHQ-9 score of 14    HPI Daniel SPURGEONpresents for   Back pain: Started 2 weeks ago.  Sharp pain.  No known injury. Hurts inside R low back area - notes with walking up a step. Pain shoots down R leg at times. Notes if sitting in chair too long. No prior PT for back known.  No hematuria, no dysuria. no new cough, no dyspnea. Tylenol 2 times per day.   Lumbar spine x-ray 04/21/2017 with mild left SI joint disease, mild degenerative changes at L5-S1, small anterior osteophytes at most levels.  No acute osseous abnormalities.  Hyperlipidemia:  Intolerant to atorvastatin secondary to myalgias.  Statins have been stopped secondary to transaminitis, continued on Zetia 10 mg  Lab Results  Component Value Date   CHOL 239 (H) 05/19/2019   HDL 40 05/19/2019   LDLCALC 155 (H) 05/19/2019   TRIG 241 (H) 05/19/2019   CHOLHDL 6.0 (H) 05/19/2019   Lab Results  Component Value Date   ALT 40 05/19/2019   AST 30 05/19/2019   ALKPHOS 141 (H) 05/19/2019   BILITOT 0.8 05/19/2019   History of DVT, chronic anticoagulation Xarelto 20 mg daily. No new bleeding.no hematuria.  Puppy at home - pomeranian. Some abrasions on hands at times.   GERD: Protonix for reflux - needs refill.   Hypertension: History of OSA on CPAP,  Evaluated by cardiology in September 2020 for dizziness/falls.  Lexiscan Myoview February 2020 with EF 59%, no  ischemia.  Event monitor in 2017 unremarkable.  Pedal edema has been treated with Lasix. Home readings: BP Readings from Last 3 Encounters:  05/19/19 126/81  05/11/19 122/80  04/21/19 125/80   Lab Results  Component Value Date   CREATININE 1.63 (H) 05/19/2019   Diabetes, type II, insulin-dependent, complicated by peripheral neuropathy, PAD, chronic renal insufficiency and foot ulcers. Managed by Dr. ELoanne Drilling endocrinology, appointment March 17 reviewed  Trulicity added, reduced Lantus dosing, history of hypoglycemia. Has continued on Lyrica 150 mg 3 times daily prior. Increased burning in feet - had to increase to 30106min am, 30015mPM, no increased leg swelling. Better control on this dose.   GERD:  Worse with late meals. Taking pantoprazole daily, taking alka seltzer and rolaids 3-4 per day for persistent daily heartburn.  Prior nissen fundoplication about 30 yrs ago. Has not seen GI recently. No vomiting,   Positive depression screening He has a history of depression,  treated by psychiatry.  Dr. KauToy CareLess appetite, sleeping more. Not sure if more depressed. Saw Dr. KauToy Carest few months - no med changes.  Sleeping more, eating less. "Does not want to get up and do nothing". Eating late.   Wt Readings from Last 3 Encounters:  05/19/19 246 lb (111.6 kg)  05/11/19 251  lb (113.9 kg)  04/21/19 256 lb (116.1 kg)  weight 268 in 09/2018.   Depression screen [facility] 2/9 05/19/2019 02/09/2019 10/04/2018 03/24/2018 11/16/2017  Decreased Interest 0 0 1 0 3  Down, Depressed, Hopeless 3 0 1 0 3  PHQ - 2 Score 3 0 2 0 6  Altered sleeping 3 - 0 0 1  Tired, decreased energy 3 - 1 0 0  Change in appetite 3 - 1 0 3  Feeling bad or failure about yourself  1 - 2 0 0  Trouble concentrating 1 - 0 0 0  Moving slowly or fidgety/restless 0 - 0 0 0  Suicidal thoughts 0 - 0 0 0  PHQ-9 Score 14 - 6 0 10  Difficult doing work/chores - - - Not difficult at all Not difficult at all  Some recent data might be  hidden     History Patient Active Problem List   Diagnosis Date Noted  . Severe major depression ([place]) 11/17/2018  . Class 2 obesity due to excess calories without serious comorbidity with body mass index (BMI) of 36.0 to 36.9 in adult 11/17/2018  . Diabetes ([place]) 11/09/2018  . Anticoagulated 08/24/2018  . Dyslipidemia 07/27/2018  . Essential hypertension 06/25/2018  . Hypogonadism in male 05/10/2018  . Numbness 03/31/2018  . Diabetic peripheral neuropathy ([place]) 01/25/2018  . History of syncope 01/25/2018  . S/P transmetatarsal amputation of foot, right ([place]) 10/09/2017  . Osteomyelitis ([place])   . Hypothyroidism   . Constipation   . History of DVT (deep vein thrombosis) 09/30/2017  . Morbid obesity due to excess calories ([place]) complicated by DM / hyperlipidemia 01/15/2017  . DOE (dyspnea on exertion) 01/13/2017  . Chronic migraine 09/17/2016  . Gait abnormality 09/17/2016  . Diabetic foot infection ([place]) 06/28/2016  . Fall 12/05/2015  . Toe ulcer, right ([place]) 09/19/2015  . Decreased pedal pulses 09/19/2015  . OSA on CPAP 09/05/2015  . Major depressive disorder, recurrent episode, moderate ([place]) 09/05/2015  . Low back pain 06/11/2015  . Abnormality of gait 06/11/2015  . Dizziness 03/17/2015  . Weakness 02/21/2015  . Chronic renal insufficiency, stage III (moderate) ([place]) 08/09/2014  . Insulin-requiring or dependent type II diabetes mellitus ([place]) 11/07/2013  . Hematuria 06/21/2013  . Hepatic steatosis 09/09/2010  . Human immunodeficiency virus (HIV) disease ([place]) 06/04/2006  . HERPES ZOSTER, UNCOMPLICATED 16/11/9602  . Depression 06/04/2006  . THROMBOPHLEBITIS NOS 06/04/2006  . GERD 06/04/2006  . ARTHRITIS, HAND 06/04/2006   Past Medical History:  Diagnosis Date  . ADHD (attention deficit hyperactivity disorder)   . Anxiety   . Chronic kidney disease   . Clotting disorder ([place])   . Depression   . Diabetes mellitus without complication ([place])   . Diabetes mellitus,  type II ([place])   . Dizziness 03/17/2015  . Essential hypertension 06/25/2018  . GERD (gastroesophageal reflux disease)   . HIV disease ([place])   . HIV infection ([place])   . Liver disease   . OSA (obstructive sleep apnea) 07/25/2015   Uses CPAP regularly  . Peripheral vascular disease ([place])   . Ulcer    Past Surgical History:  Procedure Laterality Date  . AMPUTATION Right 10/02/2017   Procedure: RIGHT TRANSMETATARSAL AMPUTATION;  Surgeon: Leandrew Koyanagi, MD;  Location: [facility];  Service: Orthopedics;  Laterality: Right;  . SMALL INTESTINE SURGERY    . STOMACH SURGERY    . TOE AMPUTATION Right 08/2016   right great toe   Allergies  Allergen Reactions  . Aspirin Swelling  .  Ibuprofen Swelling  . Sustiva [Efavirenz] Swelling and Rash  . Nsaids Other (See Comments)    unknwn  . Lipitor [Atorvastatin Calcium] Other (See Comments)    Leg pain   Prior to Admission medications   Medication Sig Start Date End Date Taking? Authorizing Provider  acetaminophen (TYLENOL) 500 MG tablet Take 1,000-1,500 mg by mouth every 8 (eight) hours as needed for moderate pain.    Yes [provider]  ALPRAZolam Duanne Moron) 1 MG tablet Take 1 mg by mouth 4 (four) times daily as needed for anxiety.    Yes [provider]  amLODipine (NORVASC) 5 MG tablet Take 5 mg by mouth daily. 02/07/19  Yes [provider]  amphetamine-dextroamphetamine (ADDERALL) 30 MG tablet Take 30 mg by mouth 3 (three) times daily.   Yes [provider]  BELSOMRA 20 MG TABS TK 1 T PO HS 11/15/18  Yes [provider]  Brexpiprazole (REXULTI) 1 MG TABS Take 1 mg by mouth at bedtime.   Yes [provider]  calcium-vitamin D (OSCAL WITH D) 500-200 MG-UNIT tablet Take 1 tablet by mouth.   Yes [provider]  canagliflozin (INVOKANA) 100 MG TABS tablet Take 1 tablet (100 mg total) by mouth daily before breakfast. 02/01/19  Yes Renato Shin, MD  Continuous Blood Gluc Sensor (FREESTYLE LIBRE 14  DAY SENSOR) MISC AS DIRECTED EVERY 14 DAYS 06/22/18  Yes Renato Shin, MD  diclofenac Sodium (VOLTAREN) 1 % GEL APPLY 2 GRAMS TOPICALLY TO EACH KNEE IN THE MORNING AND AT BEDTIME, APPLY 1 GRAM TO EACH KNEE IN THE AFTERNOON 04/11/19  Yes [name], MD  divalproex (DEPAKOTE ER) 500 MG 24 hr tablet TAKE 1 TABLET(500 MG) BY MOUTH AT BEDTIME 05/06/19  Yes [name], MD  Dulaglutide (TRULICITY) 0.81 KG/8.1EH SOPN Inject 0.75 mg into the skin once a week. 05/11/19  Yes Renato Shin, MD  ezetimibe (ZETIA) 10 MG tablet TAKE 1 TABLET(10 MG) BY MOUTH DAILY 04/06/19  Yes Skeet Latch, MD  furosemide (LASIX) 40 MG tablet TAKE 1 TABLET(40 MG) BY MOUTH DAILY 04/06/19  Yes Skeet Latch, MD  insulin aspart (NOVOLOG FLEXPEN) 100 UNIT/ML FlexPen 15 units with breakfast, and 25 unit with supper 11/08/18  Yes Renato Shin, MD  insulin glargine (LANTUS SOLOSTAR) 100 UNIT/ML Solostar Pen Inject 170 Units into the skin every morning. 05/11/19  Yes Renato Shin, MD  Insulin Pen Needle (B-D ULTRAFINE III SHORT PEN) 31G X 8 MM MISC 1 each by Other route 3 (three) times daily. Use to inject insulin TID; E11.9 03/07/19  Yes Renato Shin, MD  levothyroxine (SYNTHROID, LEVOTHROID) 50 MCG tablet Take 1 tablet (50 mcg total) by mouth at bedtime. 07/24/15  Yes [name], MD  mupirocin ointment (BACTROBAN) 2 % Place 1 application into the nose 2 (two) times daily. 06/10/18  Yes Leandrew Koyanagi, MD  ondansetron (ZOFRAN) 8 MG tablet Take 1 tablet (8 mg total) by mouth every 8 (eight) hours as needed for nausea or vomiting. 05/06/19  Yes Comer, Okey Regal, MD  pantoprazole (PROTONIX) 40 MG tablet TAKE 1 TABLET(40 MG) BY MOUTH DAILY AT 6 AM 10/04/18  Yes [name], MD  polyethylene glycol (MIRALAX / GLYCOLAX) packet Take 17 g by mouth 2 (two) times daily. 10/05/17  Yes Eugenie Filler, MD  pregabalin (LYRICA) 150 MG capsule TAKE 1 CAPSULE(150 MG) BY MOUTH THREE TIMES DAILY 04/06/19  Yes [name], MD    protriptyline (VIVACTIL) 10 MG tablet Take 10 mg  by mouth 3 (three) times daily.  01/30/16  Yes [provider]  rivaroxaban (XARELTO) 20 MG TABS tablet TAKE 1 TABLET(20 MG) BY MOUTH DAILY 10/04/18  Yes [name], MD  tiZANidine (ZANAFLEX) 4 MG tablet TAKE 1 TABLET(4 MG) BY MOUTH EVERY 6 HOURS AS NEEDED FOR MUSCLE SPASMS. DO NOT REFILL IN LESS THAN 30 DAYS 09/15/18  Yes Marcial Pacas, MD  TRIUMEQ 600-50-300 MG tablet TAKE 1 TABLET BY MOUTH DAILY 03/07/19  Yes Comer, Okey Regal, MD  UNABLE TO FIND CPAP MACHINE with standard Aclaim nasal mask with humidifier. Set at 14 cwp Patient taking differently: CPAP MACHINE with standard Aclaim nasal mask with humidifier. Set at 4 cwp 04/12/13  Yes Daub, Loura Back, MD  Vortioxetine HBr (TRINTELLIX PO) Take 25 mg by mouth at bedtime.  01/26/15  Yes [provider]  zolpidem (AMBIEN) 10 MG tablet Take 10 mg by mouth at bedtime.    Yes [provider]  Cholecalciferol (VITAMIN D3) 250 MCG (10000 UT) TABS Take by mouth.    [provider]  glucose blood (FREESTYLE PRECISION NEO TEST) test strip Used to check blood sugars twice daily. Patient not taking: Reported on 05/19/2019 07/28/17   Renato Shin, MD   Social History   Socioeconomic History  . Marital status: Single    Spouse name: Not on file  . Number of children: Not on file  . Years of education: Not on file  . Highest education level: Not on file  Occupational History    Comment: DISABILITY  Tobacco Use  . Smoking status: Former Smoker    Packs/day: 0.10    Years: 10.00    Pack years: 1.00    Types: Cigars    Quit date: 08/09/2014    Years since quitting: 4.7  . Smokeless tobacco: Never Used  Substance and Sexual Activity  . Alcohol use: No    Alcohol/week: 0.0 standard drinks  . Drug use: No  . Sexual activity: Not Currently    Partners: Male    Comment: pt. declined condoms  Other Topics Concern  . Not on file  Social History Narrative   [place]  Sleepiness Scale = 7 (as of 03/16/2015)   Social Determinants of Health   Financial Resource Strain:   . Difficulty of Paying Living Expenses:   Food Insecurity:   . Worried About Charity fundraiser in the Last Year:   . Arboriculturist in the Last Year:   Transportation Needs:   . Film/video editor (Medical):   [name] Lack of Transportation (Non-Medical):   Physical Activity:   . Days of Exercise per Week:   . Minutes of Exercise per Session:   Stress:   . Feeling of Stress :   Social Connections:   . Frequency of Communication with Friends and Family:   . Frequency of Social Gatherings with Friends and Family:   . Attends Religious Services:   . Active Member of Clubs or Organizations:   . Attends Archivist Meetings:   [name] Marital Status:   Intimate Partner Violence:   . Fear of Current or Ex-Partner:   . Emotionally Abused:   [name] Physically Abused:   . Sexually Abused:     Review of Systems Per HPI  Objective:   Vitals:   05/19/19 1604 05/19/19 1656  BP: (!) 153/82 126/81  Pulse: 96   Temp: 98.5 F (36.9 C)   TempSrc: Temporal   SpO2: 98%   Weight: 246  lb (111.6 kg)   Height: 5' 11"  (1.803 m)      Physical Exam Vitals reviewed.  Constitutional:      Appearance: He is well-developed.  HENT:     Head: Normocephalic and atraumatic.  Eyes:     Pupils: Pupils are equal, round, and reactive to light.  Neck:     Vascular: No carotid bruit or JVD.  Cardiovascular:     Rate and Rhythm: Normal rate and regular rhythm.     Heart sounds: Normal heart sounds. No murmur.  Pulmonary:     Effort: Pulmonary effort is normal.     Breath sounds: Normal breath sounds. No rales.  Musculoskeletal:     Right lower leg: No edema (No appreciable pitting edema of legs bilaterally.).     Left lower leg: No edema.     Comments: Lumbar spine, locates area of discomfort at the right paraspinals, no midline bony tenderness.  No CVA tenderness.  Slight discomfort with  rotation/lateral flexion.  Reproduces right paraspinal discomfort.  Skin:    General: Skin is warm and dry.  Neurological:     Mental Status: He is alert and oriented to person, place, and time.     DG Lumbar Spine Complete  Result Date: 05/19/2019 CLINICAL DATA:  Right low back pain for 2-3 weeks. No known injury. Right-sided sciatica. EXAM: LUMBAR SPINE - COMPLETE 4+ VIEW COMPARISON:  Lumbar radiographs 04/21/2017 FINDINGS: The alignment is maintained. Vertebral body heights are normal. There is no listhesis. The posterior elements are intact. Facet hypertrophy at L4-L5 and L5-S1. Disc space narrowing with endplate spurring at Z6-W1, unchanged from prior. Mild endplate spurring at additional levels with preservation of disc spaces. No fracture. Sacroiliac joints are congruent with mild degenerative change. IMPRESSION: Degenerative change in the lumbar spine without acute abnormality. Similar appearance of lower lumbar facet hypertrophy and degenerative disc disease at L5-S1 from 2019 radiographs. Electronically Signed   By: Keith Rake M.D.   On: 05/19/2019 17:15      Assessment & Plan:  THI SISEMORE is a 64 y.o. male . Right-sided low back pain with right-sided sciatica, unspecified chronicity - Plan: DG Lumbar Spine Complete, Ambulatory referral to Physical Therapy  -Likely flare of degenerative disc disease/degenerative changes.  Will refer to physical therapy.  No med changes for now, as on higher dose of gabapentin already.  RTC precautions.  Chronic anticoagulation Deep vein thrombosis (DVT) ([name]) - Plan: rivaroxaban (XARELTO) 20 MG TABS tablet  -Denies any bleeding, stable with Xarelto.  Continue same  Peripheral neuropathic pain Diabetic peripheral neuropathy ([place]) - Plan: pregabalin (LYRICA) 150 MG capsule  -Unfortunately has had increased neuropathic symptoms, but has responded to higher dose of gabapentin at 300 mg twice daily.  I do not see any increased pedal edema  which was concern with higher dosing in the past.  We will continue same dose for now with close monitoring.  Improved diabetic control should also help.  Gastroesophageal reflux disease, unspecified whether esophagitis present - Plan: Ambulatory referral to Gastroenterology Gastroesophageal reflux disease - Plan: pantoprazole (PROTONIX) 40 MG tablet  -Uncontrolled based on multiple daily uses of over-the-counter medication, with compliance of PPI.  Refer to gastroenterology, continue PPI for now, recommend against late meals, other reflux precautions.  Essential hypertension - Plan: Comprehensive metabolic panel, furosemide (LASIX) 40 MG tablet, amLODipine (NORVASC) 5 MG tablet  -Stable on recheck, continue same  Hyperlipidemia, unspecified hyperlipidemia type - Plan: Lipid panel, ezetimibe (ZETIA) 10 MG tablet  -Tolerating  Zetia, continue same, check labs.  Episode of recurrent major depressive disorder, unspecified depression episode severity ([place])  -On discussion it sounds like his depression has worsened, potentially impacting his appetite, and further weight loss.  Recommended he contact his psychiatrist discussed the symptoms and review medications further.  Understanding expressed.   Meds ordered this encounter  Medications  . rivaroxaban (XARELTO) 20 MG TABS tablet    Sig: TAKE 1 TABLET(20 MG) BY MOUTH DAILY    Dispense:  90 tablet    Refill:  1  . pregabalin (LYRICA) 150 MG capsule    Sig: Take 2 capsules (300 mg total) by mouth 2 (two) times daily.    Dispense:  360 capsule    Refill:  1  . pantoprazole (PROTONIX) 40 MG tablet    Sig: TAKE 1 TABLET(40 MG) BY MOUTH DAILY AT 6 AM    Dispense:  90 tablet    Refill:  1  . ezetimibe (ZETIA) 10 MG tablet    Sig: TAKE 1 TABLET(10 MG) BY MOUTH DAILY    Dispense:  90 tablet    Refill:  1  . furosemide (LASIX) 40 MG tablet    Sig: TAKE 1 TABLET(40 MG) BY MOUTH DAILY    Dispense:  90 tablet    Refill:  1  . amLODipine (NORVASC)  5 MG tablet    Sig: Take 1 tablet (5 mg total) by mouth daily.    Dispense:  90 tablet    Refill:  1   Patient Instructions     COVID-19 Vaccine Information can be found at: [email or web address].co.uk For questions related to vaccine distribution or appointments, please email [email or web address] or call [phone].   Call Dr. Toy Care to discuss the decreased appetite, sleeping more as I am concerned that your depression is worse.   Continue Protonix, I will refer you to gastroenterology for worsening reflux, I referred you to physical therapy for back pain, follow-up if any worsening pain or new symptoms.  I did authorize a higher dose of Lyrica for now. .  Recheck in the next 3 weeks, sooner if any worsening symptoms.   If you have lab work done today you will be contacted with your lab results within the next 2 weeks.  If you have not heard from [name] then please contact us. The fastest way to get your results is to register for My Chart.   IF you received an x-ray today, you will receive an invoice from [facility]. Please contact [facility] at [phone] with questions or concerns regarding your invoice.   IF you received labwork today, you will receive an invoice from [facility]. Please contact [facility] at [phone] with questions or concerns regarding your invoice.   Our billing staff will not be able to assist you with questions regarding bills from these companies.  You will be contacted with the lab results as soon as they are available. The fastest way to get your results is to activate your My Chart account. Instructions are located on the last page of this paperwork. If you have not heard from [name] regarding the results in 2 weeks, please contact this office.         Signed, [name], MD [facility]

## 2019-05-20 ENCOUNTER — Encounter: Payer: Self-pay | Admitting: Family Medicine

## 2019-05-20 LAB — COMPREHENSIVE METABOLIC PANEL
ALT: 40 IU/L (ref 0–44)
AST: 30 IU/L (ref 0–40)
Albumin/Globulin Ratio: 1.6 (ref 1.2–2.2)
Albumin: 4.8 g/dL (ref 3.8–4.8)
Alkaline Phosphatase: 141 IU/L — ABNORMAL HIGH (ref 39–117)
BUN/Creatinine Ratio: 11 (ref 10–24)
BUN: 18 mg/dL (ref 8–27)
Bilirubin Total: 0.8 mg/dL (ref 0.0–1.2)
CO2: 25 mmol/L (ref 20–29)
Calcium: 10.1 mg/dL (ref 8.6–10.2)
Chloride: 100 mmol/L (ref 96–106)
Creatinine, Ser: 1.63 mg/dL — ABNORMAL HIGH (ref 0.76–1.27)
GFR calc Af Amer: 50 mL/min/{1.73_m2} — ABNORMAL LOW (ref >59)
GFR calc non Af Amer: 44 mL/min/{1.73_m2} — ABNORMAL LOW (ref >59)
Globulin, Total: 3 g/dL (ref 1.5–4.5)
Glucose: 135 mg/dL — ABNORMAL HIGH (ref 65–99)
Potassium: 3.8 mmol/L (ref 3.5–5.2)
Sodium: 143 mmol/L (ref 134–144)
Total Protein: 7.8 g/dL (ref 6.0–8.5)

## 2019-05-20 LAB — LIPID PANEL
Chol/HDL Ratio: 6 ratio — ABNORMAL HIGH (ref 0.0–5.0)
Cholesterol, Total: 239 mg/dL — ABNORMAL HIGH (ref 100–199)
HDL: 40 mg/dL (ref >39)
LDL Chol Calc (NIH): 155 mg/dL — ABNORMAL HIGH (ref 0–99)
Triglycerides: 241 mg/dL — ABNORMAL HIGH (ref 0–149)
VLDL Cholesterol Cal: 44 mg/dL — ABNORMAL HIGH (ref 5–40)

## 2019-05-23 ENCOUNTER — Encounter: Payer: Self-pay | Admitting: Gastroenterology

## 2019-05-25 ENCOUNTER — Encounter: Payer: Self-pay | Admitting: Family Medicine

## 2019-05-25 ENCOUNTER — Other Ambulatory Visit: Payer: Self-pay

## 2019-05-25 ENCOUNTER — Ambulatory Visit: Payer: PPO | Admitting: Family Medicine

## 2019-05-25 VITALS — BP 151/89 | HR 101 | Temp 97.4°F | Ht 70.0 in | Wt 245.0 lb

## 2019-05-25 DIAGNOSIS — G4733 Obstructive sleep apnea (adult) (pediatric): Secondary | ICD-10-CM | POA: Diagnosis not present

## 2019-05-25 DIAGNOSIS — Z9989 Dependence on other enabling machines and devices: Secondary | ICD-10-CM

## 2019-05-25 NOTE — Progress Notes (Signed)
PATIENT: Daniel [name] [facility] DOB: 1955-09-15  REASON FOR VISIT: follow up HISTORY FROM: patient  Chief Complaint  Patient presents with  . Follow-up    rm 1, alone, CPAP, pt states mask leaks      HISTORY OF PRESENT ILLNESS: Today 05/25/19 OMAIR DETTMER is a 64 y.o. male here today for follow up for OSA on CPAP.  Daniel [name] reports that he is using CPAP consistently, however, he continues to feel fatigued throughout the day.  He was seen by Dr. Kem Boroughs in September 2020.  Orders were placed to adjust pressure settings to an auto titrating setting between 6 and 16 cm of water as well as a mask refitting due to leak with current full facemask.  In reviewing today's CPAP compliance report, did not appear that these changes were made.  He did not hear back from the DME company regarding a mask refitting.  He continues to use a fullface mask.  He does continue to note a leak.  Compliance report dated 04/24/2019 through 05/23/2019 reveals that he used CPAP 30 of the past 30 days for compliance of 100%.  He used CPAP greater than 4 hours 28 of the last 30 days for compliance of 93%.  Average usage was 9 hours and 30 minutes.  Residual AHI was 17.5 on a set pressure of 14 cm of water and an EPR of 2.  There was a significant leak noted in the 95th percentile of 26.4 L/min.  HISTORY: (copied from Dr Dohmeier's note on 11/17/2018)  RV 11-17-2018  Daniel [name] is a 64 y.o. male , was originally seen here as a referral from Dr. Carlota Raspberry for a sleep consultation, he is also followed by [name] for Migraine headaches. The patient is HIV positive and adheres to social distancing COVID guidelines. He lives with a 42 year old aunt and brother.  Mr. Liford reports that over 14 years ago he had a sleep study and he was prescribed a CPAP machine. Mr. Dill underwent a sleep study on the my guidance on June 17, 2015 which confirmed the presence of moderate sleep apnea at an AHI of 20.2/h strongly accentuated in  supine sleep to 36.3/h.  He was titrated to only 5 cmH2O which  but unable to initiate sleep in the lab at all.  He took 2 generic ambiens each night at home. Now on belsomra. Has nocturia 4-5 times at night.  We therefore ordered an auto titration device at the time and this is a follow up on compliance.   Daniel [name] has been 100% compliant use of CPAP with an average of over 12 hours of daily use, CPAP is set at 14 cmH2O pressure was 2 cm EPR, but currently he has a very high residual apnea index the AHI is 11.4 and this may be related to high air leakage with a maximum of 34 L/min.  I did not see any evidence of central apneas only obstructive apneas he still seems to snore sometimes through his CPAP.  I will change his settings to 5-16 cm water with 2 cm EPR and new supplies, his facial hair may make air seal impossible to achieve, especially with a FFM. He is not shaven, the beard is not shortly trimmed.    REVIEW OF SYSTEMS: Out of a complete 14 system review of symptoms, the patient complains only of the following symptoms, fatigue, apnea, depression, insomnia and all other reviewed systems are negative.  ALLERGIES: Allergies  Allergen Reactions  . Aspirin Swelling  . Ibuprofen Swelling  . Sustiva [Efavirenz] Swelling and Rash  . Nsaids Other (See Comments)    unknwn  . Lipitor [Atorvastatin Calcium] Other (See Comments)    Leg pain    HOME MEDICATIONS: Outpatient Medications Prior to Visit  Medication Sig Dispense Refill  . acetaminophen (TYLENOL) 500 MG tablet Take 1,000-1,500 mg by mouth every 8 (eight) hours as needed for moderate pain.     [name] ALPRAZolam (XANAX) 1 MG tablet Take 1 mg by mouth 4 (four) times daily as needed for anxiety.     [name] amLODipine (NORVASC) 5 MG tablet Take 1 tablet (5 mg total) by mouth daily. 90 tablet 1  . amphetamine-dextroamphetamine (ADDERALL) 30 MG tablet Take 30 mg by mouth 3 (three) times daily.    . BELSOMRA 20 MG TABS TK 1 T PO HS    .  Brexpiprazole (REXULTI) 1 MG TABS Take 1 mg by mouth at bedtime.    . calcium-vitamin D (OSCAL WITH D) 500-200 MG-UNIT tablet Take 1 tablet by mouth.    . canagliflozin (INVOKANA) 100 MG TABS tablet Take 1 tablet (100 mg total) by mouth daily before breakfast. 30 tablet 11  . Cholecalciferol (VITAMIN D3) 250 MCG (10000 UT) TABS Take by mouth.    . Continuous Blood Gluc Sensor (FREESTYLE LIBRE 14 DAY SENSOR) MISC AS DIRECTED EVERY 14 DAYS 6 each 3  . diclofenac Sodium (VOLTAREN) 1 % GEL APPLY 2 GRAMS TOPICALLY TO EACH KNEE IN THE MORNING AND AT BEDTIME, APPLY 1 GRAM TO EACH KNEE IN THE AFTERNOON 300 g 0  . divalproex (DEPAKOTE ER) 500 MG 24 hr tablet TAKE 1 TABLET(500 MG) BY MOUTH AT BEDTIME 90 tablet 1  . Dulaglutide (TRULICITY) 1.91 YN/8.2NF SOPN Inject 0.75 mg into the skin once a week. 4 pen 11  . ezetimibe (ZETIA) 10 MG tablet TAKE 1 TABLET(10 MG) BY MOUTH DAILY 90 tablet 1  . furosemide (LASIX) 40 MG tablet TAKE 1 TABLET(40 MG) BY MOUTH DAILY 90 tablet 1  . insulin aspart (NOVOLOG FLEXPEN) 100 UNIT/ML FlexPen 15 units with breakfast, and 25 unit with supper 15 mL 11  . insulin glargine (LANTUS SOLOSTAR) 100 UNIT/ML Solostar Pen Inject 170 Units into the skin every morning. 25 pen PRN  . Insulin Pen Needle (B-D ULTRAFINE III SHORT PEN) 31G X 8 MM MISC 1 each by Other route 3 (three) times daily. Use to inject insulin TID; E11.9 90 each 2  . levothyroxine (SYNTHROID, LEVOTHROID) 50 MCG tablet Take 1 tablet (50 mcg total) by mouth at bedtime. 30 tablet 11  . mupirocin ointment (BACTROBAN) 2 % Place 1 application into the nose 2 (two) times daily. 22 g 3  . ondansetron (ZOFRAN) 8 MG tablet Take 1 tablet (8 mg total) by mouth every 8 (eight) hours as needed for nausea or vomiting. 20 tablet 1  . pantoprazole (PROTONIX) 40 MG tablet TAKE 1 TABLET(40 MG) BY MOUTH DAILY AT 6 AM 90 tablet 1  . polyethylene glycol (MIRALAX / GLYCOLAX) packet Take 17 g by mouth 2 (two) times daily. 30 each 0  .  pregabalin (LYRICA) 150 MG capsule Take 2 capsules (300 mg total) by mouth 2 (two) times daily. 360 capsule 1  . protriptyline (VIVACTIL) 10 MG tablet Take 10 mg by mouth 3 (three) times daily.   11  . rivaroxaban (XARELTO) 20 MG TABS tablet TAKE 1 TABLET(20 MG) BY MOUTH DAILY 90 tablet 1  . tiZANidine (ZANAFLEX) 4 MG  tablet TAKE 1 TABLET(4 MG) BY MOUTH EVERY 6 HOURS AS NEEDED FOR MUSCLE SPASMS. DO NOT REFILL IN LESS THAN 30 DAYS 20 tablet 2  . TRIUMEQ 600-50-300 MG tablet TAKE 1 TABLET BY MOUTH DAILY 30 tablet 4  . UNABLE TO FIND CPAP MACHINE with standard Aclaim nasal mask with humidifier. Set at 14 cwp (Patient taking differently: CPAP MACHINE with standard Aclaim nasal mask with humidifier. Set at 4 cwp) 1 each 0  . Vortioxetine HBr (TRINTELLIX PO) Take 25 mg by mouth at bedtime.     [name] zolpidem (AMBIEN) 10 MG tablet Take 10 mg by mouth at bedtime.     [name] glucose blood (FREESTYLE PRECISION NEO TEST) test strip Used to check blood sugars twice daily. 100 each 12   No facility-administered medications prior to visit.    PAST MEDICAL HISTORY: Past Medical History:  Diagnosis Date  . ADHD (attention deficit hyperactivity disorder)   . Anxiety   . Chronic kidney disease   . Clotting disorder ([place])   . Depression   . Diabetes mellitus without complication ([place])   . Diabetes mellitus, type II ([place])   . Dizziness 03/17/2015  . Essential hypertension 06/25/2018  . GERD (gastroesophageal reflux disease)   . HIV disease ([place])   . HIV infection ([place])   . Liver disease   . OSA (obstructive sleep apnea) 07/25/2015   Uses CPAP regularly  . Peripheral vascular disease ([place])   . Ulcer     PAST SURGICAL HISTORY: Past Surgical History:  Procedure Laterality Date  . AMPUTATION Right 10/02/2017   Procedure: RIGHT TRANSMETATARSAL AMPUTATION;  Surgeon: Leandrew Koyanagi, MD;  Location: [facility];  Service: Orthopedics;  Laterality: Right;  . SMALL INTESTINE SURGERY    . STOMACH SURGERY    . TOE AMPUTATION  Right 08/2016   right great toe    FAMILY HISTORY: Family History  Problem Relation Age of Onset  . Depression Brother   . Throat cancer Brother        half brother, never smoker  . COPD Mother   . Diabetes Neg Hx     SOCIAL HISTORY: Social History   Socioeconomic History  . Marital status: Single    Spouse name: Not on file  . Number of children: Not on file  . Years of education: Not on file  . Highest education level: Not on file  Occupational History    Comment: DISABILITY  Tobacco Use  . Smoking status: Former Smoker    Packs/day: 0.10    Years: 10.00    Pack years: 1.00    Types: Cigars    Quit date: 08/09/2014    Years since quitting: 4.7  . Smokeless tobacco: Never Used  Substance and Sexual Activity  . Alcohol use: No    Alcohol/week: 0.0 standard drinks  . Drug use: No  . Sexual activity: Not Currently    Partners: Male    Comment: pt. declined condoms  Other Topics Concern  . Not on file  Social History Narrative   Epworth Sleepiness Scale = 7 (as of 03/16/2015)   Social Determinants of Health   Financial Resource Strain:   . Difficulty of Paying Living Expenses:   Food Insecurity:   . Worried About Charity fundraiser in the Last Year:   . Arboriculturist in the Last Year:   Transportation Needs:   . Film/video editor (Medical):   [name] Lack of Transportation (Non-Medical):   Physical Activity:   .  Days of Exercise per Week:   . Minutes of Exercise per Session:   Stress:   . Feeling of Stress :   Social Connections:   . Frequency of Communication with Friends and Family:   . Frequency of Social Gatherings with Friends and Family:   . Attends Religious Services:   . Active Member of Clubs or Organizations:   . Attends Archivist Meetings:   [name] Marital Status:   Intimate Partner Violence:   . Fear of Current or Ex-Partner:   . Emotionally Abused:   [name] Physically Abused:   . Sexually Abused:       PHYSICAL EXAM  Vitals:    05/25/19 1407  BP: (!) 151/89  Pulse: (!) 101  Temp: (!) 97.4 F (36.3 C)  Weight: 245 lb (111.1 kg)  Height: 5' 10"  (1.778 m)   Body mass index is 35.15 kg/m.  Generalized: Well developed, in no acute distress  Cardiology: normal rate and rhythm, no murmur noted Respiratory: clear to auscultation bilaterally  Neurological examination  Mentation: Alert oriented to time, place, history taking. Follows all commands speech and language fluent Cranial nerve II-XII: Pupils were equal round reactive to light. Extraocular movements were full, visual field were full  Motor: The motor testing reveals 5 over 5 strength of all 4 extremities. Good symmetric motor tone is noted throughout.  Gait and station: Gait is normal.   DIAGNOSTIC DATA (LABS, IMAGING, TESTING) - I reviewed patient records, labs, notes, testing and imaging myself where available.  MMSE - Mini Mental State Exam 01/19/2018 10/23/2016 09/17/2016  Orientation to time 5 4 3   Orientation to Place 5 5 5   Registration 3 3 3   Attention/ Calculation 5 5 5   Recall 0 2 1  Language- name 2 objects 2 2 2   Language- repeat 1 1 1   Language- follow 3 step command 3 3 3   Language- read & follow direction 1 1 1   Write a sentence 1 1 1   Copy design 1 1 1   Total score 27 28 26      Lab Results  Component Value Date   WBC 8.0 11/17/2018   HGB 17.3 11/17/2018   HCT 50.0 11/17/2018   MCV 93 11/17/2018   PLT 149 (L) 11/17/2018      Component Value Date/Time   NA 143 05/19/2019 1719   K 3.8 05/19/2019 1719   CL 100 05/19/2019 1719   CO2 25 05/19/2019 1719   GLUCOSE 135 (H) 05/19/2019 1719   GLUCOSE 60 (L) 11/04/2017 1611   BUN 18 05/19/2019 1719   CREATININE 1.63 (H) 05/19/2019 1719   CREATININE 1.51 (H) 11/04/2017 1611   CALCIUM 10.1 05/19/2019 1719   PROT 7.8 05/19/2019 1719   ALBUMIN 4.8 05/19/2019 1719   AST 30 05/19/2019 1719   ALT 40 05/19/2019 1719   ALKPHOS 141 (H) 05/19/2019 1719   BILITOT 0.8 05/19/2019 1719    GFRNONAA 44 (L) 05/19/2019 1719   GFRNONAA 48 (L) 11/04/2017 1611   GFRAA 50 (L) 05/19/2019 1719   GFRAA 56 (L) 11/04/2017 1611   Lab Results  Component Value Date   CHOL 239 (H) 05/19/2019   HDL 40 05/19/2019   LDLCALC 155 (H) 05/19/2019   TRIG 241 (H) 05/19/2019   CHOLHDL 6.0 (H) 05/19/2019   Lab Results  Component Value Date   HGBA1C 7.2 (A) 05/11/2019   Lab Results  Component Value Date   VITAMINB12 528 09/17/2016   Lab Results  Component Value Date  TSH 0.736 09/17/2016    ASSESSMENT AND PLAN 64 y.o. year old male  has a past medical history of ADHD (attention deficit hyperactivity disorder), Anxiety, Chronic kidney disease, Clotting disorder ([place]), Depression, Diabetes mellitus without complication ([place]), Diabetes mellitus, type II ([place]), Dizziness (03/17/2015), Essential hypertension (06/25/2018), GERD (gastroesophageal reflux disease), HIV disease ([place]), HIV infection ([place]), Liver disease, OSA (obstructive sleep apnea) (07/25/2015), Peripheral vascular disease ([place]), and Ulcer. here with     ICD-10-CM   1. OSA on CPAP  G47.33 For home use only DME continuous positive airway pressure (CPAP)   Z99.89 For home use only DME continuous positive airway pressure (CPAP)    Daniel [name] returns today for evaluation of OSA on CPAP therapy.  Unfortunately, orders placed by Dr. Brett Fairy in September 2020 were not put into place.  He is still using the same facemask.  Pressure settings were not changed.  Residual AHI remains elevated despite excellent compliance.  I will resend these orders today.  He was advised that if he does not hear back from [facility] in the next week he should contact my office.  We will bring him back in for evaluation in 3 months to assess AHI.  May consider titration study if needed.  He was encouraged to continue using CPAP nightly and for greater than 4 hours each night.  He verbalizes understanding and agreement with this plan.   Orders Placed This Encounter    Procedures  . For home use only DME continuous positive airway pressure (CPAP)    Please adjust pressure setting to autotitration with min pressure of 6cmH20 and maximum pressure of 16cmH20.    Order Specific Question:   Length of Need    Answer:   Lifetime    Order Specific Question:   Patient has OSA or probable OSA    Answer:   Yes    Order Specific Question:   Is the patient currently using CPAP in the home    Answer:   Yes    Order Specific Question:   Settings    Answer:   Other see comments    Order Specific Question:   CPAP supplies needed    Answer:   Mask, headgear, cushions, filters, heated tubing and water chamber  . For home use only DME continuous positive airway pressure (CPAP)    Mask refitting please. Consider nasal pillow with chin strap due to facial hair.    Order Specific Question:   Length of Need    Answer:   Lifetime    Order Specific Question:   Patient has OSA or probable OSA    Answer:   Yes    Order Specific Question:   Is the patient currently using CPAP in the home    Answer:   Yes    Order Specific Question:   Settings    Answer:   Other see comments    Order Specific Question:   CPAP supplies needed    Answer:   Mask, headgear, cushions, filters, heated tubing and water chamber     No orders of the defined types were placed in this encounter.     I spent 15 minutes with the patient. 50% of this time was spent counseling and educating patient on plan of care and medications.    Debbora Presto, FNP-C 05/25/2019, 4:35 PM [facility] [address], [place], [place] [address] [phone]

## 2019-05-25 NOTE — Patient Instructions (Signed)
Please continue using your CPAP regularly. While your insurance requires that you use CPAP at least 4 hours each night on 70% of the nights, I recommend, that you not skip any nights and use it throughout the night if you can. Getting used to CPAP and staying with the treatment long term does take time and patience and discipline. Untreated obstructive sleep apnea when it is moderate to severe can have an adverse impact on cardiovascular health and raise her risk for heart disease, arrhythmias, hypertension, congestive heart failure, stroke and diabetes. Untreated obstructive sleep apnea causes sleep disruption, nonrestorative sleep, and sleep deprivation. This can have an impact on your day to day functioning and cause daytime sleepiness and impairment of cognitive function, memory loss, mood disturbance, and problems focussing. Using CPAP regularly can improve these symptoms.  We will resend orders previously sent by Dr Brett Fairy. Please let us know if you do not hear back from [facility] in 1 week.   Follow up in 3 months   Sleep Apnea Sleep apnea affects breathing during sleep. It causes breathing to stop for a short time or to become shallow. It can also increase the risk of:  Heart attack.  Stroke.  Being very overweight (obese).  Diabetes.  Heart failure.  Irregular heartbeat. The goal of treatment is to help you breathe normally again. What are the causes? There are three kinds of sleep apnea:  Obstructive sleep apnea. This is caused by a blocked or collapsed airway.  Central sleep apnea. This happens when the brain does not send the right signals to the muscles that control breathing.  Mixed sleep apnea. This is a combination of obstructive and central sleep apnea. The most common cause of this condition is a collapsed or blocked airway. This can happen if:  Your throat muscles are too relaxed.  Your tongue and tonsils are too large.  You are overweight.  Your airway is too  small. What increases the risk?  Being overweight.  Smoking.  Having a small airway.  Being older.  Being male.  Drinking alcohol.  Taking medicines to calm yourself (sedatives or tranquilizers).  Having family members with the condition. What are the signs or symptoms?  Trouble staying asleep.  Being sleepy or tired during the day.  Getting angry a lot.  Loud snoring.  Headaches in the morning.  Not being able to focus your mind (concentrate).  Forgetting things.  Less interest in sex.  Mood swings.  Personality changes.  Feelings of sadness (depression).  Waking up a lot during the night to pee (urinate).  Dry mouth.  Sore throat. How is this diagnosed?  Your medical history.  A physical exam.  A test that is done when you are sleeping (sleep study). The test is most often done in a sleep lab but may also be done at home. How is this treated?   Sleeping on your side.  Using a medicine to get rid of mucus in your nose (decongestant).  Avoiding the use of alcohol, medicines to help you relax, or certain pain medicines (narcotics).  Losing weight, if needed.  Changing your diet.  Not smoking.  Using a machine to open your airway while you sleep, such as: ? An oral appliance. This is a mouthpiece that shifts your lower jaw forward. ? A CPAP device. This device blows air through a mask when you breathe out (exhale). ? An EPAP device. This has valves that you put in each nostril. ? A BPAP device. This  device blows air through a mask when you breathe in (inhale) and breathe out.  Having surgery if other treatments do not work. It is important to get treatment for sleep apnea. Without treatment, it can lead to:  High blood pressure.  Coronary artery disease.  In men, not being able to have an erection (impotence).  Reduced thinking ability. Follow these instructions at home: Lifestyle  Make changes that your doctor recommends.  Eat a  healthy diet.  Lose weight if needed.  Avoid alcohol, medicines to help you relax, and some pain medicines.  Do not use any products that contain nicotine or tobacco, such as cigarettes, e-cigarettes, and chewing tobacco. If you need help quitting, ask your doctor. General instructions  Take over-the-counter and prescription medicines only as told by your doctor.  If you were given a machine to use while you sleep, use it only as told by your doctor.  If you are having surgery, make sure to tell your doctor you have sleep apnea. You may need to bring your device with you.  Keep all follow-up visits as told by your doctor. This is important. Contact a doctor if:  The machine that you were given to use during sleep bothers you or does not seem to be working.  You do not get better.  You get worse. Get help right away if:  Your chest hurts.  You have trouble breathing in enough air.  You have an uncomfortable feeling in your back, arms, or stomach.  You have trouble talking.  One side of your body feels weak.  A part of your face is hanging down. These symptoms may be an emergency. Do not wait to see if the symptoms will go away. Get medical help right away. Call your local emergency services (911 in the [place] Do not drive yourself to the hospital. Summary  This condition affects breathing during sleep.  The most common cause is a collapsed or blocked airway.  The goal of treatment is to help you breathe normally while you sleep. This information is not intended to replace advice given to you by your health care provider. Make sure you discuss any questions you have with your health care provider. Document Revised: 11/27/2017 Document Reviewed: 10/06/2017 Elsevier Patient Education  [place].

## 2019-05-31 ENCOUNTER — Encounter: Payer: Self-pay | Admitting: Internal Medicine

## 2019-06-01 ENCOUNTER — Ambulatory Visit (HOSPITAL_COMMUNITY): Payer: PPO | Attending: Family Medicine | Admitting: Physical Therapy

## 2019-06-01 ENCOUNTER — Encounter (HOSPITAL_COMMUNITY): Payer: Self-pay | Admitting: Physical Therapy

## 2019-06-01 ENCOUNTER — Other Ambulatory Visit: Payer: Self-pay

## 2019-06-01 DIAGNOSIS — M6281 Muscle weakness (generalized): Secondary | ICD-10-CM | POA: Diagnosis not present

## 2019-06-01 DIAGNOSIS — G8929 Other chronic pain: Secondary | ICD-10-CM | POA: Diagnosis not present

## 2019-06-01 DIAGNOSIS — R29898 Other symptoms and signs involving the musculoskeletal system: Secondary | ICD-10-CM | POA: Diagnosis not present

## 2019-06-01 DIAGNOSIS — M545 Low back pain, unspecified: Secondary | ICD-10-CM

## 2019-06-01 NOTE — Therapy (Signed)
[place], [place], [address] Phone: [phone]   Fax:  [phone]  Physical Therapy Evaluation  Patient Details  Name: Daniel [name] MRN: [id number] Date of Birth: 1955/06/27 Referring Provider (PT): [name], MD   Encounter Date: 06/01/2019  PT End of Session - 06/01/19 1756    Visit Number  1    Number of Visits  8    Date for PT Re-Evaluation  06/29/19    Authorization Type  [employer] (No visit limit, no auth required)    Authorization Time Period  06/01/19 - 06/29/19    Progress Note Due on Visit  8    PT Start Time  1605    PT Stop Time  1645    PT Time Calculation (min)  40 min    Activity Tolerance  Patient tolerated treatment well    Behavior During Therapy  [facility] for tasks assessed/performed       Past Medical History:  Diagnosis Date  . ADHD (attention deficit hyperactivity disorder)   . Anxiety   . Chronic kidney disease   . Clotting disorder ([place])   . Depression   . Diabetes mellitus without complication ([place])   . Diabetes mellitus, type II ([place])   . Dizziness 03/17/2015  . Essential hypertension 06/25/2018  . GERD (gastroesophageal reflux disease)   . HIV disease ([place])   . HIV infection ([place])   . Liver disease   . OSA (obstructive sleep apnea) 07/25/2015   Uses CPAP regularly  . Peripheral vascular disease ([place])   . Ulcer     Past Surgical History:  Procedure Laterality Date  . AMPUTATION Right 10/02/2017   Procedure: RIGHT TRANSMETATARSAL AMPUTATION;  Surgeon: Leandrew Koyanagi, MD;  Location: [place];  Service: Orthopedics;  Laterality: Right;  . SMALL INTESTINE SURGERY    . STOMACH SURGERY    . TOE AMPUTATION Right 08/2016   right great toe    There were no vitals filed for this visit.   Subjective Assessment - 06/01/19 1632    Subjective  Patient reported that his lower back hurts, his upper back hurts, and his side hurts at times. Patient reported that the pain has  been going on for quite a few years. Patient reported the pain occurs more when he is standing. He reported that he also gets radiating pain down his right leg when he is sitting and that it stops at about the back of his knee most of the time. Patient denied any changes in bowel and bladder function, but reported frequent urination due to being on water pills. He reported that he's had the toes of his right foot amputated and does have some difficulty with balance. Patient cares for his elderly aunt. Patient reported having a fall in [place] several years ago.  Reports difficulty with sitting up in bed due to core weakness.    Pertinent History  Ongoing LBP of several years duration    Limitations  Standing;Walking;House hold activities    Diagnostic tests  x-ray lumbar: degenerative change lumbar spine    Patient Stated Goals  To have less pain    Currently in Pain?  Yes    Pain Score  1     Pain Location  Back    Pain Orientation  Right    Pain Descriptors / Indicators  Aching    Pain Type  Chronic pain    Pain Onset  More than a month ago  Pain Frequency  Constant    Aggravating Factors   Standing    Pain Relieving Factors  None    Effect of Pain on Daily Activities  Moderately affects         [facility] PT Assessment - 06/01/19 0001      Assessment   Medical Diagnosis  LBP with RT sided sciatica    Referring Provider (PT)  [name], MD    Onset Date/Surgical Date  --   several years   Next MD Visit  06/09/19    Prior Therapy  Yes, following big toe amputation      Precautions   Precautions  None      Restrictions   Weight Bearing Restrictions  No      Balance Screen   Has the patient fallen in the past 6 months  No    Has the patient had a decrease in activity level because of a fear of falling?   Yes    Is the patient reluctant to leave their home because of a fear of falling?   No      Home Environment   Living Environment  Private residence    Type of Home   House      Prior Function   Level of Independence  Independent;Independent with basic ADLs      Cognition   Overall Cognitive Status  Within Functional Limits for tasks assessed      Observation/Other Assessments   Focus on Therapeutic Outcomes (FOTO)   64.3%      ROM / Strength   AROM / PROM / Strength  AROM;Strength      AROM   AROM Assessment Site  Lumbar    Lumbar Flexion  WFL    Lumbar Extension  90% limited; dizziness    Lumbar - Right Side Bend  WFL    Lumbar - Left Side Bend  WFL    Lumbar - Right Rotation  WFL    Lumbar - Left Rotation  WFL   Pain on the right side     Strength   Strength Assessment Site  Hip;Knee;Ankle    Right/Left Hip  Right;Left    Right Hip Flexion  4/5    Right Hip Extension  3+/5    Right Hip ABduction  4+/5    Left Hip Flexion  4/5    Left Hip Extension  4+/5    Left Hip ABduction  4+/5    Right/Left Knee  Right;Left    Right Knee Flexion  5/5    Right Knee Extension  5/5    Left Knee Flexion  5/5    Left Knee Extension  5/5    Right/Left Ankle  Right;Left    Right Ankle Dorsiflexion  5/5    Left Ankle Dorsiflexion  5/5      Palpation   Spinal mobility  hypomobile spine particularly lower thoracic spine with CPAs    Palpation comment  Denied any tenderness to palpation with CPAs or with palpation of the thoracic or lumbar paraspinals      Special Tests    Special Tests  Lumbar    Lumbar Tests  Slump Test      Slump test   Comment  negative bilaterally      Ambulation/Gait   Gait Comments  Decreased stance time/push-off on right LE                Objective measurements completed on examination: See above findings.  PT Education - 06/01/19 1756    Education Details  Discussed examination findings and POC.    Person(s) Educated  Patient    Methods  Explanation    Comprehension  Verbalized understanding       PT Short Term Goals - 06/01/19 1758      PT SHORT TERM GOAL #1   Title   Patient to be compliant with correct performance of HEP, to be updated PRN     Time  2    Period  Weeks    Status  New    Target Date  06/15/19      PT SHORT TERM GOAL #2   Title  Patient will report overall improvement in symptoms of at least 25% to improve quality of life.    Time  2    Period  Weeks    Status  New    Target Date  06/15/19        PT Long Term Goals - 06/01/19 1759      PT LONG TERM GOAL #1   Title  Patient will report overall improvement in symptoms of at least 50% to improve quality of life.    Time  4    Period  Weeks    Status  New    Target Date  06/29/19      PT LONG TERM GOAL #2   Title  Patient will report that his pain has not radiated past his hip in at least 3 days indicating improved centralization of pain.    Time  4    Period  Weeks    Status  New    Target Date  06/29/19      PT LONG TERM GOAL #3   Title  Patient will demonstrate improvement in lumbar AROM of at least 25% in deficient planes of movement for improved mobility to assist with ADLs.    Time  4    Period  Weeks    Status  New    Target Date  06/29/19             Plan - 06/01/19 1812    Clinical Impression Statement  Patient is a 64 year old male who presents to outpatient physical therapy with primary complaint of back pain which has been ongoing for several years. Upon examination, patient demonstrated decreased lumbar AROM as well as noted hypomobility with spinal mobility testing. Patient did report an increase in pain with lumbar AROM, however denied any tenderness to palpation with CPAs or muscle palpation. Patient did demonstrate some decreased proximal muscle weakness in his lower extremities as well. While at this time feel that patient's pain is of musculoskeletal nature, patient did report a history of having CKD, therefore, plan to continue to monitor patient's symptoms for possible non-musculoskeletal cause of pain. Patient would benefit from continued skilled  physical therapy to address the abovementioned deficits and help improve patient's overall QOL.    Personal Factors and Comorbidities  Age;Time since onset of injury/illness/exacerbation;Comorbidity 3+    Comorbidities  HTN, GERD, DMII, peripheral neuropathy, CKD III, HIV, Depression, Transmetatarsal amputation of the right foot    Examination-Activity Limitations  Stand;Locomotion Level;Bed Mobility;Bend;Transfers;Squat    Examination-Participation Restrictions  Yard Work;Cleaning;Meal Prep;Community Activity    Stability/Clinical Decision Making  Evolving/Moderate complexity    Clinical Decision Making  Moderate    Rehab Potential  Fair    PT Frequency  2x / week    PT Duration  4 weeks  PT Treatment/Interventions  ADLs/Self Care Home Management;Aquatic Therapy;Cryotherapy;Electrical Stimulation;Moist Heat;Traction;DME Instruction;Gait training;Stair training;Functional mobility training;Therapeutic activities;Therapeutic exercise;Balance training;Neuromuscular re-education;Patient/family education;Orthotic Fit/Training;Manual techniques;Passive range of motion;Dry needling;Energy conservation;Splinting;Taping    PT Next Visit Plan  Assess hip ROM and any change in pain symptoms. Initiate HEP focused on mobility exercises. Focus on mobility exercises with some core strengthening. Manual: joint mobilizations particularly to thoracic spine    PT Home Exercise Plan  Initiate at first session    Consulted and Agree with Plan of Care  Patient       Patient will benefit from skilled therapeutic intervention in order to improve the following deficits and impairments:  Abnormal gait, Pain, Decreased mobility, Decreased activity tolerance, Decreased endurance, Decreased range of motion, Decreased strength, Hypomobility, Obesity  Visit Diagnosis: Chronic bilateral low back pain, unspecified whether sciatica present  Muscle weakness (generalized)  Other symptoms and signs involving the  musculoskeletal system     Problem List Patient Active Problem List   Diagnosis Date Noted  . Severe major depression ([place]) 11/17/2018  . Class 2 obesity due to excess calories without serious comorbidity with body mass index (BMI) of 36.0 to 36.9 in adult 11/17/2018  . Diabetes ([place]) 11/09/2018  . Anticoagulated 08/24/2018  . Dyslipidemia 07/27/2018  . Essential hypertension 06/25/2018  . Hypogonadism in male 05/10/2018  . Numbness 03/31/2018  . Diabetic peripheral neuropathy ([place]) 01/25/2018  . History of syncope 01/25/2018  . S/P transmetatarsal amputation of foot, right ([place]) 10/09/2017  . Osteomyelitis ([place])   . Hypothyroidism   . Constipation   . History of DVT (deep vein thrombosis) 09/30/2017  . Morbid obesity due to excess calories ([place]) complicated by DM / hyperlipidemia 01/15/2017  . DOE (dyspnea on exertion) 01/13/2017  . Chronic migraine 09/17/2016  . Gait abnormality 09/17/2016  . Diabetic foot infection ([place]) 06/28/2016  . Fall 12/05/2015  . Toe ulcer, right ([place]) 09/19/2015  . Decreased pedal pulses 09/19/2015  . OSA on CPAP 09/05/2015  . Major depressive disorder, recurrent episode, moderate ([place]) 09/05/2015  . Low back pain 06/11/2015  . Abnormality of gait 06/11/2015  . Dizziness 03/17/2015  . Weakness 02/21/2015  . Chronic renal insufficiency, stage III (moderate) ([place]) 08/09/2014  . Insulin-requiring or dependent type II diabetes mellitus ([place]) 11/07/2013  . Hematuria 06/21/2013  . Hepatic steatosis 09/09/2010  . Human immunodeficiency virus (HIV) disease ([place]) 06/04/2006  . HERPES ZOSTER, UNCOMPLICATED 45/62/5638  . Depression 06/04/2006  . THROMBOPHLEBITIS NOS 06/04/2006  . GERD 06/04/2006  . ARTHRITIS, HAND 06/04/2006   Clarene Critchley PT, DPT 6:21 PM, 06/01/19 [place], [place], [address] Phone: [phone]   Fax:  [phone]  Name: Daniel [name] MRN:  [id number] Date of Birth: 1955/10/28

## 2019-06-06 ENCOUNTER — Other Ambulatory Visit: Payer: Self-pay | Admitting: Endocrinology

## 2019-06-06 DIAGNOSIS — Z794 Long term (current) use of insulin: Secondary | ICD-10-CM

## 2019-06-06 DIAGNOSIS — E0849 Diabetes mellitus due to underlying condition with other diabetic neurological complication: Secondary | ICD-10-CM

## 2019-06-07 ENCOUNTER — Other Ambulatory Visit: Payer: Self-pay

## 2019-06-07 ENCOUNTER — Encounter (HOSPITAL_COMMUNITY): Payer: Self-pay

## 2019-06-07 ENCOUNTER — Ambulatory Visit (HOSPITAL_COMMUNITY): Payer: PPO

## 2019-06-07 DIAGNOSIS — G8929 Other chronic pain: Secondary | ICD-10-CM

## 2019-06-07 DIAGNOSIS — M545 Low back pain, unspecified: Secondary | ICD-10-CM

## 2019-06-07 DIAGNOSIS — R29898 Other symptoms and signs involving the musculoskeletal system: Secondary | ICD-10-CM

## 2019-06-07 DIAGNOSIS — M6281 Muscle weakness (generalized): Secondary | ICD-10-CM

## 2019-06-07 NOTE — Patient Instructions (Addendum)
Lower Trunk Rotation Stretch    Keeping back flat and feet together, rotate knees to left side. Hold 10 seconds. Repeat 5 times per set. Do 1-2 sets per session.   http://orth.exer.us/122   Copyright  VHI. All rights reserved.   Bridging    Slowly raise buttocks from floor, keeping stomach tight. Repeat 10 times per set. Do 1-2 sets per session.   http://orth.exer.us/1096   Copyright  VHI. All rights reserved.   Knee to Chest (Flexion)    Pull knee toward chest. Feel stretch in lower back or buttock area. Breathing deeply, Hold 30 seconds. Repeat with other knee. Repeat 3 times. Do 2 sessions per day.  http://gt2.exer.us/225   Copyright  VHI. All rights reserved.   Bracing With Leg March (Hook-Lying)    With neutral spine, tighten pelvic floor and abdominals and hold. Alternating legs, lift foot 12 inches and return to floor. Repeat 10 times. Do 1-2 times a day.   Copyright  VHI. All rights reserved.

## 2019-06-07 NOTE — Therapy (Addendum)
[employer] [address] [place], [place], [address] Phone: [phone]   Fax:  [phone]  Physical Therapy Treatment and Discharge Note  Patient Details  Name: Daniel [name] MRN: [id number] Date of Birth: October 04, 1955 Referring Provider (PT): [name], MD  PHYSICAL THERAPY DISCHARGE SUMMARY  Visits from Start of Care: 2  Current functional level related to goals / functional outcomes: Could not be reassessed secondary to unplanned discharge.   Remaining deficits: Could not be reassessed secondary to unplanned discharge   Education / Equipment: Could not be reassessed secondary to unplanned discharge  Plan: Patient agrees to discharge.  Patient goals were not met. Patient is being discharged due to not returning since the last visit.  ?????         Patient had three consecutive no shows for therapy and could not be reached by phone. Patient to be discharged from therapy at this time secondary to non adherence to POC.   4:22 PM, 06/21/19 Jerene Pitch, DPT Physical Therapy with [facility]  [phone] office   Encounter Date: 06/07/2019  PT End of Session - 06/07/19 1711    Visit Number  2    Number of Visits  8    Date for PT Re-Evaluation  06/29/19    Authorization Type  [employer] (No visit limit, no auth required);    Authorization Time Period  06/01/19 - 06/29/19    Progress Note Due on Visit  8    PT Start Time  1708    PT Stop Time  1747    PT Time Calculation (min)  39 min    Activity Tolerance  Patient tolerated treatment well;No increased pain    Behavior During Therapy  WFL for tasks assessed/performed   decreased eye contact during discussion      Past Medical History:  Diagnosis Date  . ADHD (attention deficit hyperactivity disorder)   . Anxiety   . Chronic kidney disease   . Clotting disorder ([place])   . Depression   . Diabetes mellitus without complication  ([place])   . Diabetes mellitus, type II ([place])   . Dizziness 03/17/2015  . Essential hypertension 06/25/2018  . GERD (gastroesophageal reflux disease)   . HIV disease ([place])   . HIV infection ([place])   . Liver disease   . OSA (obstructive sleep apnea) 07/25/2015   Uses CPAP regularly  . Peripheral vascular disease ([place])   . Ulcer     Past Surgical History:  Procedure Laterality Date  . AMPUTATION Right 10/02/2017   Procedure: RIGHT TRANSMETATARSAL AMPUTATION;  Surgeon: Leandrew Koyanagi, MD;  Location: [facility];  Service: Orthopedics;  Laterality: Right;  . SMALL INTESTINE SURGERY    . STOMACH SURGERY    . TOE AMPUTATION Right 08/2016   right great toe    There were no vitals filed for this visit.  Subjective Assessment - 06/07/19 1710    Subjective  Pt reports pain mimimal today across lower back, stated less than 1/10.  Reports he has a very late sleeping schedule, sleeps a lot during the day and up late at night.    Pertinent History  Ongoing LBP of several years duration    Patient Stated Goals  To have less pain    Pain Score  1     Pain Location  Back    Pain Orientation  Lower    Pain Descriptors / Indicators  --   "worseum, bothers me"; Radicular  symptoms posterior Rt knee   Pain Type  Chronic pain    Pain Onset  More than a month ago    Pain Frequency  Constant    Aggravating Factors   Standing    Pain Relieving Factors  None    Effect of Pain on Daily Activities  Moderately affects         [facility] PT Assessment - 06/07/19 0001      Assessment   Medical Diagnosis  LBP with RT sided sciatica    Referring Provider (PT)  [name], MD    Onset Date/Surgical Date  --   several years ago   Next MD Visit  06/09/19    Prior Therapy  Yes, following big toe amputation      ROM / Strength   AROM / PROM / Strength  AROM      AROM   AROM Assessment Site  Hip    Right/Left Hip  Right;Left    Right Hip Extension  6   from prone   Right Hip Flexion  110    Right Hip  External Rotation   60   prone position   Right Hip Internal Rotation   --    Left Hip Extension  8    Left Hip Flexion  105   no pain, feels a stretch   Left Hip External Rotation   60    Left Hip Internal Rotation   --                   [facility] Adult PT Treatment/Exercise - 06/07/19 0001      Exercises   Exercises  Lumbar      Lumbar Exercises: Stretches   Single Knee to Chest Stretch  3 reps;30 seconds    Lower Trunk Rotation  5 reps;10 seconds      Lumbar Exercises: Supine   Ab Set  10 reps    AB Set Limitations  cueing for mechanics and to complete with exhale    Bent Knee Raise  10 reps;3 seconds    Bent Knee Raise Limitations  with ab set, paired with exhale    Bridge  10 reps             PT Education - 06/07/19 1802    Education Details  Reviewed goals, educated importance of HEP compliance for maximal benefits and established HEP this session.    Person(s) Educated  Patient    Methods  Explanation;Verbal cues;Handout    Comprehension  Verbalized understanding;Returned demonstration       PT Short Term Goals - 06/01/19 1758      PT SHORT TERM GOAL #1   Title  Patient to be compliant with correct performance of HEP, to be updated PRN     Time  2    Period  Weeks    Status  New    Target Date  06/15/19      PT SHORT TERM GOAL #2   Title  Patient will report overall improvement in symptoms of at least 25% to improve quality of life.    Time  2    Period  Weeks    Status  New    Target Date  06/15/19        PT Long Term Goals - 06/01/19 1759      PT LONG TERM GOAL #1   Title  Patient will report overall improvement in symptoms of at least 50% to improve quality  of life.    Time  4    Period  Weeks    Status  New    Target Date  06/29/19      PT LONG TERM GOAL #2   Title  Patient will report that his pain has not radiated past his hip in at least 3 days indicating improved centralization of pain.    Time  4    Period  Weeks     Status  New    Target Date  06/29/19      PT LONG TERM GOAL #3   Title  Patient will demonstrate improvement in lumbar AROM of at least 25% in deficient planes of movement for improved mobility to assist with ADLs.    Time  4    Period  Weeks    Status  New    Target Date  06/29/19            Plan - 06/07/19 1803    Clinical Impression Statement  Reviewed goals, educated importance of HEP compliance and established home exercises program this session.  Therex focus on lumbar mobility and proximal strengthening.  Pt able to demonstrate appropriate mechanics following cueing for form and mechanics, able to demonstrate and verbalized understanding.  Also assessed BLE hip mobility with decreased extension noted.  Pt required multimodal cueing to improve abdominal contraction, improved when paired with breathing though does need more cueing to complete independently.  No reports of increased pain, was limited by fatigue.    Personal Factors and Comorbidities  Age;Time since onset of injury/illness/exacerbation;Comorbidity 3+    Comorbidities  HTN, GERD, DMII, peripheral neuropathy, CKD III, HIV, Depression, Transmetatarsal amputation of the right foot    Examination-Activity Limitations  Stand;Locomotion Level;Bed Mobility;Bend;Transfers;Squat    Examination-Participation Restrictions  Yard Work;Cleaning;Meal Prep;Community Activity    Stability/Clinical Decision Making  Evolving/Moderate complexity    Clinical Decision Making  Moderate    Rehab Potential  Fair    PT Frequency  2x / week    PT Duration  4 weeks    PT Treatment/Interventions  ADLs/Self Care Home Management;Aquatic Therapy;Cryotherapy;Electrical Stimulation;Moist Heat;Traction;DME Instruction;Gait training;Stair training;Functional mobility training;Therapeutic activities;Therapeutic exercise;Balance training;Neuromuscular re-education;Patient/family education;Orthotic Fit/Training;Manual techniques;Passive range of motion;Dry  needling;Energy conservation;Splinting;Taping    PT Next Visit Plan  Assess HEP compliance and any change in pain symptoms. Focus on mobility exercises with some core strengthening. Manual: joint mobilizations particularly to thoracic spine    PT Home Exercise Plan  06/07/19:  LTR, SKTC, bridge and bent knee raise paired with breathing.       Patient will benefit from skilled therapeutic intervention in order to improve the following deficits and impairments:  Abnormal gait, Pain, Decreased mobility, Decreased activity tolerance, Decreased endurance, Decreased range of motion, Decreased strength, Hypomobility, Obesity  Visit Diagnosis: Muscle weakness (generalized)  Other symptoms and signs involving the musculoskeletal system  Chronic bilateral low back pain, unspecified whether sciatica present     Problem List Patient Active Problem List   Diagnosis Date Noted  . Severe major depression ([place]) 11/17/2018  . Class 2 obesity due to excess calories without serious comorbidity with body mass index (BMI) of 36.0 to 36.9 in adult 11/17/2018  . Diabetes ([place]) 11/09/2018  . Anticoagulated 08/24/2018  . Dyslipidemia 07/27/2018  . Essential hypertension 06/25/2018  . Hypogonadism in male 05/10/2018  . Numbness 03/31/2018  . Diabetic peripheral neuropathy ([place]) 01/25/2018  . History of syncope 01/25/2018  . S/P transmetatarsal amputation of foot, right ([place]) 10/09/2017  . Osteomyelitis (  [place])   . Hypothyroidism   . Constipation   . History of DVT (deep vein thrombosis) 09/30/2017  . Morbid obesity due to excess calories ([place]) complicated by DM / hyperlipidemia 01/15/2017  . DOE (dyspnea on exertion) 01/13/2017  . Chronic migraine 09/17/2016  . Gait abnormality 09/17/2016  . Diabetic foot infection ([place]) 06/28/2016  . Fall 12/05/2015  . Toe ulcer, right ([place]) 09/19/2015  . Decreased pedal pulses 09/19/2015  . OSA on CPAP 09/05/2015  . Major depressive disorder, recurrent episode,  moderate ([place]) 09/05/2015  . Low back pain 06/11/2015  . Abnormality of gait 06/11/2015  . Dizziness 03/17/2015  . Weakness 02/21/2015  . Chronic renal insufficiency, stage III (moderate) ([place]) 08/09/2014  . Insulin-requiring or dependent type II diabetes mellitus ([place]) 11/07/2013  . Hematuria 06/21/2013  . Hepatic steatosis 09/09/2010  . Human immunodeficiency virus (HIV) disease ([place]) 06/04/2006  . HERPES ZOSTER, UNCOMPLICATED 88/87/5797  . Depression 06/04/2006  . THROMBOPHLEBITIS NOS 06/04/2006  . GERD 06/04/2006  . ARTHRITIS, HAND 06/04/2006   Ihor Austin, LPTA/CLT; CBIS [phone]  Aldona Lento 06/07/2019, 6:07 PM  [facility] [address] [place], [place], [address] Phone: [phone]   Fax:  [phone]  Name: Daniel [name] MRN: [id number] Date of Birth: May 15, 1955

## 2019-06-09 ENCOUNTER — Ambulatory Visit: Payer: PPO | Admitting: Family Medicine

## 2019-06-10 ENCOUNTER — Ambulatory Visit (HOSPITAL_COMMUNITY): Payer: PPO | Admitting: Physical Therapy

## 2019-06-10 ENCOUNTER — Telehealth (HOSPITAL_COMMUNITY): Payer: Self-pay | Admitting: Physical Therapy

## 2019-06-10 NOTE — Telephone Encounter (Signed)
pt called to cx due to he says he has allergies.

## 2019-06-13 ENCOUNTER — Ambulatory Visit: Payer: PPO | Admitting: Family Medicine

## 2019-06-14 ENCOUNTER — Ambulatory Visit (HOSPITAL_COMMUNITY): Payer: PPO | Admitting: Physical Therapy

## 2019-06-14 ENCOUNTER — Telehealth (HOSPITAL_COMMUNITY): Payer: Self-pay | Admitting: Physical Therapy

## 2019-06-14 NOTE — Telephone Encounter (Signed)
No Show #1 Attempted to call patient but busy signal with numbers on file. Could not leave message or remind patient of upcoming appointment.   3:37 PM, 06/14/19 Jerene Pitch, DPT Physical Therapy with [facility]  [phone] office

## 2019-06-16 ENCOUNTER — Ambulatory Visit (HOSPITAL_COMMUNITY): Payer: PPO | Admitting: Physical Therapy

## 2019-06-16 ENCOUNTER — Telehealth (HOSPITAL_COMMUNITY): Payer: Self-pay | Admitting: Physical Therapy

## 2019-06-16 ENCOUNTER — Encounter: Payer: Self-pay | Admitting: Family Medicine

## 2019-06-16 NOTE — Telephone Encounter (Signed)
Pt did not show for appointment today (NS#2).  Unable to reach patient on number given but able to contact friend Quita Skye) who states patient has been sleeping a lot and it is not unusual for him.  Explained concern that he has missed 2 appt and per our NS policy he will be discharged if he NS one additional visit.  Per our policy, one appt left for Tuesday at 2pm and all other appt cancelled and will be scheduled 1 at a time if he returns.  Requested friend to have pateint call our clinic regarding missed appt and we would need a better number to reach him on.  Teena Irani, PTA/CLT [phone]

## 2019-06-17 ENCOUNTER — Encounter: Payer: Self-pay | Admitting: Family Medicine

## 2019-06-21 ENCOUNTER — Ambulatory Visit (HOSPITAL_COMMUNITY): Payer: PPO | Admitting: Physical Therapy

## 2019-06-21 ENCOUNTER — Telehealth (HOSPITAL_COMMUNITY): Payer: Self-pay | Admitting: Physical Therapy

## 2019-06-21 NOTE — Telephone Encounter (Signed)
No show #3. Could not leave voicemail as phone line was busy. Will cancel all following visits and discharge patient from PT at this time.   4:20 PM, 06/21/19 Jerene Pitch, DPT Physical Therapy with [facility]  [phone] office

## 2019-06-23 ENCOUNTER — Encounter (HOSPITAL_COMMUNITY): Payer: PPO | Admitting: Physical Therapy

## 2019-06-24 ENCOUNTER — Ambulatory Visit: Payer: PPO | Admitting: Gastroenterology

## 2019-06-28 ENCOUNTER — Ambulatory Visit (HOSPITAL_COMMUNITY): Payer: PPO | Admitting: Physical Therapy

## 2019-06-30 ENCOUNTER — Encounter (HOSPITAL_COMMUNITY): Payer: PPO | Admitting: Physical Therapy

## 2019-07-05 ENCOUNTER — Other Ambulatory Visit: Payer: Self-pay

## 2019-07-05 MED ORDER — ONDANSETRON HCL 8 MG PO TABS: 8.0000 mg | ORAL_TABLET | Freq: Three times a day (TID) | ORAL | 1 refills | Status: DC | PRN

## 2019-07-08 ENCOUNTER — Other Ambulatory Visit: Payer: Self-pay

## 2019-07-12 ENCOUNTER — Other Ambulatory Visit: Payer: Self-pay

## 2019-07-12 ENCOUNTER — Telehealth (INDEPENDENT_AMBULATORY_CARE_PROVIDER_SITE_OTHER): Payer: PPO | Admitting: Endocrinology

## 2019-07-12 DIAGNOSIS — Z794 Long term (current) use of insulin: Secondary | ICD-10-CM | POA: Diagnosis not present

## 2019-07-12 DIAGNOSIS — E1121 Type 2 diabetes mellitus with diabetic nephropathy: Secondary | ICD-10-CM | POA: Diagnosis not present

## 2019-07-12 DIAGNOSIS — E1122 Type 2 diabetes mellitus with diabetic chronic kidney disease: Secondary | ICD-10-CM

## 2019-07-12 DIAGNOSIS — N1831 Chronic kidney disease, stage 3a: Secondary | ICD-10-CM | POA: Diagnosis not present

## 2019-07-12 MED ORDER — TRULICITY 1.5 MG/0.5ML ~~LOC~~ SOAJ: 1.5000 mg | SUBCUTANEOUS | 3 refills | Status: DC

## 2019-07-12 NOTE — Patient Instructions (Addendum)
I have sent a prescription to your pharmacy, to increase the Trulicity, and:   Please continue the same other diabetes medications.   You can use up your current Trulicity shots by taking 1 shot every 4 days.   check your blood sugar twice a day.  vary the time of day when you check, between before the 3 meals, and at bedtime.  also check if you have symptoms of your blood sugar being too high or too low.  please keep a record of the readings and bring it to your next appointment here (or you can bring the meter itself).  You can write it on any piece of paper.  please call us sooner if your blood sugar goes below 70, or if you have a lot of readings over 200.   Please come back for a follow-up appointment in 6 weeks.

## 2019-07-12 NOTE — Progress Notes (Signed)
Subjective:    Patient ID: Daniel [name], male    DOB: 02-27-1955, 64 y.o.   MRN: [id number]  HPI  telehealth visit today via video visit.  Alternatives to telehealth are presented to this patient, and the patient agrees to the telehealth visit. Pt is advised of the cost of the visit, and agrees to this, also.   Patient is at home, and I am at the office.   Persons attending the telehealth visit: the patient and I Pt returns for f/u of diabetes mellitus: DM type: Insulin-requiring type 2.  Dx'ed: 2831 Complications: PN, PAD, CRI, and foot ulcers.   Therapy: insulin since 2016, and Invokana DKA: never Severe hypoglycemia: never.  Pancreatitis: never.   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   Other: he takes multiple daily injections, but basal insulin is emphasized, with improved results; he is retired.   Interval history: I reviewed continuous glucose monitor data.  Glucose varies from 130-350, but most are approx 200.  There is little trend throughout the day.  pt states he feels well in general. Past Medical History:  Diagnosis Date  . ADHD (attention deficit hyperactivity disorder)   . Anxiety   . Chronic kidney disease   . Clotting disorder ([place])   . Depression   . Diabetes mellitus without complication ([place])   . Diabetes mellitus, type II ([place])   . Dizziness 03/17/2015  . Essential hypertension 06/25/2018  . GERD (gastroesophageal reflux disease)   . HIV disease ([place])   . HIV infection ([place])   . Liver disease   . OSA (obstructive sleep apnea) 07/25/2015   Uses CPAP regularly  . Peripheral vascular disease ([place])   . Ulcer     Past Surgical History:  Procedure Laterality Date  . AMPUTATION Right 10/02/2017   Procedure: RIGHT TRANSMETATARSAL AMPUTATION;  Surgeon: Leandrew Koyanagi, MD;  Location: [facility];  Service: Orthopedics;  Laterality: Right;  . SMALL INTESTINE SURGERY    . STOMACH SURGERY    . TOE AMPUTATION Right 08/2016    right great toe    Social History   Socioeconomic History  . Marital status: Single    Spouse name: Not on file  . Number of children: Not on file  . Years of education: Not on file  . Highest education level: Not on file  Occupational History    Comment: DISABILITY  Tobacco Use  . Smoking status: Former Smoker    Packs/day: 0.10    Years: 10.00    Pack years: 1.00    Types: Cigars    Quit date: 08/09/2014    Years since quitting: 4.9  . Smokeless tobacco: Never Used  Substance and Sexual Activity  . Alcohol use: No    Alcohol/week: 0.0 standard drinks  . Drug use: No  . Sexual activity: Not Currently    Partners: Male    Comment: pt. declined condoms  Other Topics Concern  . Not on file  Social History Narrative   Epworth Sleepiness Scale = 7 (as of 03/16/2015)   Social Determinants of Health   Financial Resource Strain:   . Difficulty of Paying Living Expenses:   Food Insecurity:   . Worried About Charity fundraiser in the Last Year:   . Arboriculturist in the Last Year:   Transportation Needs:   . Film/video editor (Medical):   [name] Lack of Transportation (Non-Medical):   Physical Activity:   .  Days of Exercise per Week:   . Minutes of Exercise per Session:   Stress:   . Feeling of Stress :   Social Connections:   . Frequency of Communication with Friends and Family:   . Frequency of Social Gatherings with Friends and Family:   . Attends Religious Services:   . Active Member of Clubs or Organizations:   . Attends Archivist Meetings:   [name] Marital Status:   Intimate Partner Violence:   . Fear of Current or Ex-Partner:   . Emotionally Abused:   [name] Physically Abused:   . Sexually Abused:     Current Outpatient Medications on File Prior to Visit  Medication Sig Dispense Refill  . acetaminophen (TYLENOL) 500 MG tablet Take 1,000-1,500 mg by mouth every 8 (eight) hours as needed for moderate pain.     [name] ALPRAZolam (XANAX) 1 MG tablet Take 1 mg  by mouth 4 (four) times daily as needed for anxiety.     [name] amLODipine (NORVASC) 5 MG tablet Take 1 tablet (5 mg total) by mouth daily. 90 tablet 1  . amphetamine-dextroamphetamine (ADDERALL) 30 MG tablet Take 30 mg by mouth 3 (three) times daily.    . B-D ULTRAFINE III SHORT PEN 31G X 8 MM MISC USE AS DIRECTED THREE TIMES DAILY 100 each 2  . BELSOMRA 20 MG TABS TK 1 T PO HS    . Brexpiprazole (REXULTI) 1 MG TABS Take 1 mg by mouth at bedtime.    . calcium-vitamin D (OSCAL WITH D) 500-200 MG-UNIT tablet Take 1 tablet by mouth.    . canagliflozin (INVOKANA) 100 MG TABS tablet Take 1 tablet (100 mg total) by mouth daily before breakfast. 30 tablet 11  . Cholecalciferol (VITAMIN D3) 250 MCG (10000 UT) TABS Take by mouth.    . Continuous Blood Gluc Sensor (FREESTYLE LIBRE 14 DAY SENSOR) MISC USE AS DIRECTED EVERY 14 DAYS 6 each 3  . diclofenac Sodium (VOLTAREN) 1 % GEL APPLY 2 GRAMS TOPICALLY TO EACH KNEE IN THE MORNING AND AT BEDTIME, APPLY 1 GRAM TO EACH KNEE IN THE AFTERNOON 300 g 0  . divalproex (DEPAKOTE ER) 500 MG 24 hr tablet TAKE 1 TABLET(500 MG) BY MOUTH AT BEDTIME 90 tablet 1  . ezetimibe (ZETIA) 10 MG tablet TAKE 1 TABLET(10 MG) BY MOUTH DAILY 90 tablet 1  . furosemide (LASIX) 40 MG tablet TAKE 1 TABLET(40 MG) BY MOUTH DAILY 90 tablet 1  . insulin aspart (NOVOLOG FLEXPEN) 100 UNIT/ML FlexPen 15 units with breakfast, and 25 unit with supper 15 mL 11  . insulin glargine (LANTUS SOLOSTAR) 100 UNIT/ML Solostar Pen Inject 170 Units into the skin every morning. 25 pen PRN  . levothyroxine (SYNTHROID, LEVOTHROID) 50 MCG tablet Take 1 tablet (50 mcg total) by mouth at bedtime. 30 tablet 11  . mupirocin ointment (BACTROBAN) 2 % Place 1 application into the nose 2 (two) times daily. 22 g 3  . ondansetron (ZOFRAN) 8 MG tablet Take 1 tablet (8 mg total) by mouth every 8 (eight) hours as needed for nausea or vomiting. 20 tablet 1  . pantoprazole (PROTONIX) 40 MG tablet TAKE 1 TABLET(40 MG) BY MOUTH DAILY  AT 6 AM 90 tablet 1  . polyethylene glycol (MIRALAX / GLYCOLAX) packet Take 17 g by mouth 2 (two) times daily. 30 each 0  . pregabalin (LYRICA) 150 MG capsule Take 2 capsules (300 mg total) by mouth 2 (two) times daily. 360 capsule 1  . protriptyline (VIVACTIL) 10 MG tablet  Take 10 mg by mouth 3 (three) times daily.   11  . rivaroxaban (XARELTO) 20 MG TABS tablet TAKE 1 TABLET(20 MG) BY MOUTH DAILY 90 tablet 1  . tiZANidine (ZANAFLEX) 4 MG tablet TAKE 1 TABLET(4 MG) BY MOUTH EVERY 6 HOURS AS NEEDED FOR MUSCLE SPASMS. DO NOT REFILL IN LESS THAN 30 DAYS 20 tablet 2  . TRIUMEQ 600-50-300 MG tablet TAKE 1 TABLET BY MOUTH DAILY 30 tablet 4  . UNABLE TO FIND CPAP MACHINE with standard Aclaim nasal mask with humidifier. Set at 14 cwp (Patient taking differently: CPAP MACHINE with standard Aclaim nasal mask with humidifier. Set at 4 cwp) 1 each 0  . Vortioxetine HBr (TRINTELLIX PO) Take 25 mg by mouth at bedtime.     [name] zolpidem (AMBIEN) 10 MG tablet Take 10 mg by mouth at bedtime.      No current facility-administered medications on file prior to visit.    Allergies  Allergen Reactions  . Aspirin Swelling  . Ibuprofen Swelling  . Sustiva [Efavirenz] Swelling and Rash  . Nsaids Other (See Comments)    unknwn  . Lipitor [Atorvastatin Calcium] Other (See Comments)    Leg pain    Family History  Problem Relation Age of Onset  . Depression Brother   . Throat cancer Brother        half brother, never smoker  . COPD Mother   . Diabetes Neg Hx     There were no vitals taken for this visit.   Review of Systems He denies hypoglycemia and nausea.      Objective:   Physical Exam        Assessment & Plan:  Insulin-requiring type 2 DM, with PAD: worse   Patient Instructions  I have sent a prescription to your pharmacy, to increase the Trulicity, and:   Please continue the same other diabetes medications.   You can use up your current Trulicity shots by taking 1 shot every 4 days.     check your blood sugar twice a day.  vary the time of day when you check, between before the 3 meals, and at bedtime.  also check if you have symptoms of your blood sugar being too high or too low.  please keep a record of the readings and bring it to your next appointment here (or you can bring the meter itself).  You can write it on any piece of paper.  please call us sooner if your blood sugar goes below 70, or if you have a lot of readings over 200.   Please come back for a follow-up appointment in 6 weeks.

## 2019-07-12 NOTE — Progress Notes (Signed)
Success Icon @ 3:42 PM Invitation Sent Your invitation to [phone]- has been sent. This invitation will expire in 30 minutes.  OK

## 2019-07-12 NOTE — Progress Notes (Signed)
Success Icon @ 4:08 PM Invitation Sent Your invitation to [phone] has been sent. This invitation will expire in 30 minutes.

## 2019-07-15 DIAGNOSIS — G4733 Obstructive sleep apnea (adult) (pediatric): Secondary | ICD-10-CM | POA: Diagnosis not present

## 2019-07-22 ENCOUNTER — Telehealth: Payer: Self-pay

## 2019-07-22 NOTE — Telephone Encounter (Signed)
COVID-19 Pre-Screening Questions:07/22/19  Do you currently have a fever (>100 F), chills or unexplained body aches? NO  Are you currently experiencing new cough, shortness of breath, sore throat, runny nose?NO .  [name] Have you recently travelled outside the state of [place] in the last 14 days?NO    Have you been in contact with someone that is currently pending confirmation of Covid19 testing or has been confirmed to have the [place] virus? NO  **If the patient answers NO to ALL questions -  advise the patient to please call the clinic before coming to the office should any symptoms develop.

## 2019-07-26 ENCOUNTER — Other Ambulatory Visit: Payer: PPO

## 2019-08-03 ENCOUNTER — Other Ambulatory Visit: Payer: Self-pay

## 2019-08-03 DIAGNOSIS — B20 Human immunodeficiency virus [HIV] disease: Secondary | ICD-10-CM

## 2019-08-03 MED ORDER — TRIUMEQ 600-50-300 MG PO TABS: 1.0000 | ORAL_TABLET | Freq: Every day | ORAL | 0 refills | Status: DC

## 2019-08-04 ENCOUNTER — Other Ambulatory Visit: Payer: PPO

## 2019-08-10 ENCOUNTER — Encounter: Payer: PPO | Admitting: Internal Medicine

## 2019-08-15 ENCOUNTER — Telehealth: Payer: Self-pay | Admitting: *Deleted

## 2019-08-15 ENCOUNTER — Telehealth: Payer: Self-pay | Admitting: Family Medicine

## 2019-08-15 NOTE — Telephone Encounter (Signed)
SCHEDULE AWV

## 2019-08-15 NOTE — Telephone Encounter (Signed)
Patient would like a call back to schedule awv anytime after 1:00 tomorrow

## 2019-08-16 ENCOUNTER — Encounter: Payer: PPO | Admitting: Family

## 2019-08-16 NOTE — Telephone Encounter (Signed)
Left message to schedule AWV

## 2019-08-24 ENCOUNTER — Encounter: Payer: Self-pay | Admitting: Endocrinology

## 2019-08-24 ENCOUNTER — Ambulatory Visit: Payer: PPO | Admitting: Neurology

## 2019-08-24 ENCOUNTER — Encounter: Payer: Self-pay | Admitting: Neurology

## 2019-08-24 ENCOUNTER — Other Ambulatory Visit: Payer: Self-pay

## 2019-08-24 ENCOUNTER — Ambulatory Visit: Payer: PPO | Admitting: Endocrinology

## 2019-08-24 VITALS — BP 132/78 | HR 90 | Ht 71.5 in | Wt 253.0 lb

## 2019-08-24 VITALS — BP 116/60 | HR 82 | Ht 71.5 in | Wt 251.6 lb

## 2019-08-24 DIAGNOSIS — Z794 Long term (current) use of insulin: Secondary | ICD-10-CM | POA: Diagnosis not present

## 2019-08-24 DIAGNOSIS — E1121 Type 2 diabetes mellitus with diabetic nephropathy: Secondary | ICD-10-CM | POA: Diagnosis not present

## 2019-08-24 DIAGNOSIS — N1831 Chronic kidney disease, stage 3a: Secondary | ICD-10-CM | POA: Diagnosis not present

## 2019-08-24 DIAGNOSIS — Z9989 Dependence on other enabling machines and devices: Secondary | ICD-10-CM | POA: Diagnosis not present

## 2019-08-24 DIAGNOSIS — G4733 Obstructive sleep apnea (adult) (pediatric): Secondary | ICD-10-CM

## 2019-08-24 DIAGNOSIS — E1122 Type 2 diabetes mellitus with diabetic chronic kidney disease: Secondary | ICD-10-CM

## 2019-08-24 LAB — POCT GLYCOSYLATED HEMOGLOBIN (HGB A1C): Hemoglobin A1C: 6.2 % — AB (ref 4.0–5.6)

## 2019-08-24 MED ORDER — LANTUS SOLOSTAR 100 UNIT/ML ~~LOC~~ SOPN: 150.0000 [IU] | PEN_INJECTOR | SUBCUTANEOUS | 99 refills | Status: DC

## 2019-08-24 MED ORDER — TRULICITY 3 MG/0.5ML ~~LOC~~ SOAJ: 3.0000 mg | SUBCUTANEOUS | 3 refills | Status: DC

## 2019-08-24 MED ORDER — LANTUS SOLOSTAR 100 UNIT/ML ~~LOC~~ SOPN: 130.0000 [IU] | PEN_INJECTOR | SUBCUTANEOUS | 99 refills | Status: DC

## 2019-08-24 NOTE — Progress Notes (Addendum)
SLEEP MEDICINE CLINIC   Provider:  Larey Seat, M D  Referring Provider: [name], MD Primary Care Physician:  [name], MD  Chief Complaint  Patient presents with  . Follow-up    pt alone, rm 10. presents following up with CPAP. D[facility] and is interested in transferring care to [facility] 08-24-2019,  I have the pleasure of meeting today again with Daniel [name], a 64 year old caucasian gentleman, who has been referred for migraine treatment to [place] originally and was followed  By [name] and in 2017 became a sleep apnea patient of mine.   Primary care is still Dr. Cindee Lame.   The patient has been using CPAP compliantly 97% with an average use at time of 9 hours and 20 minutes, his minimum pressure setting of 6 maximum pressure 16 and 2 cm expiratory pressure relief are provided his residual AHI is 4.8/h.  the majority is still obstructive when looking at the breakdown.  There are only 0.1 central apnea and 0 point point unknown apnea per hour the air leak is moderately to high at 95th percentile 20.7 L/min and the 95th percentile pressure is 15.6 cm water , so it exceeds what is currently the limit on his autotitrator.   His serial number is [id number].  he had been followed by [place] [facility] [place]-  on [place] and would like to change to [employer].  There is also an issue with billing and costs that seem to be met preferably with [employer].   I looked at the therapy report in detail and the air leak was actually worse in the first week of June and since then has been reduced and not sure what kind of explanation there is.  The AHI varies greatly: many nights it is under 2/h other nights it may be above 10/h. The  Patient's facial hair prevents a FFM seal- he know this very well. Reportedly he has had [place] stroke - he is also chronically anticoagulated and has not had recent blood clots.  HIV status  positive.   abacavir-dolutegravir-lamiVUDine (TRIUMEQ) 600-50-300 MG tablet 30 tablet 0 08/03/2019   Take 1 tablet by mouth daily. - Oral  .08/24/19 Larey Seat, MD   HPI: 05-25-2019, patient last seen by Debbora Presto, NP [name] returns today for evaluation of OSA on CPAP therapy.  Unfortunately, orders placed by Dr. Brett Fairy in September 2020 were not put into place.  He is still using the same facemask.  Pressure settings were not changed.  Residual AHI remains elevated despite excellent compliance.  I will resend these orders today.  He was advised that if he does not hear back from [facility] in the next week he should contact my office.  We will bring him back in for evaluation in 3 months to assess AHI.  May consider titration study if needed.  He was encouraged to continue using CPAP nightly and for greater than 4 hours each night.  He verbalizes understanding and agreement with this plan.    RV 11-17-2018  Daniel [name] is a 64 y.o. male , was originally seen here as a referral from Dr. Carlota Raspberry for a sleep consultation, he is also followed by [name] for Migraine headaches. The patient is HIV positive and adheres to social distancing COVID guidelines. He lives with a 18 year old aunt and brother.  [name] reports that over 14 years  ago he had a sleep study and he was prescribed a CPAP machine. [name] underwent a sleep study on the my guidance on June 17, 2015 which confirmed the presence of moderate sleep apnea at an AHI of 20.2/h strongly accentuated in supine sleep to 36.3/h.  He was titrated to only 5 cmH2O which  but unable to initiate sleep in the lab at all.  He took 2 generic ambiens each night at home. Now on belsomra. Has nocturia 4-5 times at night.  We therefore ordered an auto titration device at the time and this is a follow up on compliance. I will change his settings to 8-18 cm water with 2 cm EPR and new supplies, his facial hair may make air seal impossible to achieve,  especially with a FFM. He is not shaven, the beard is not shortly trimmed.      [name] is an established patient of Dr. Toy Care and has seen [name] for the last 8 years . He has a history of diabetes mellitus, chronic depression, HIV infection, blood clotting disorder foot drop and ADHD. He is followed for chronic renal insufficiency by a nephrologist- his kidney disease has been stable.  He is currently taking Xanax; codeine, Adderall, Lipitor he checks his blood sugars at home he takes Synthroid 75 g, Antivert when necessary was given in February a prescription for oxycodone, has Compazine, ranitidine, Xarelto, 20 Lex, Ambien.   His review of system is positive for frequency of urination, heat intolerance, light sensitivity, shortness of breath and coughing, he had recently a DVT which caused his leg to swell. Has a history of apnea treated with a CPAP machine that is by now very old, and has recurrent daytime sleepiness issues. The currnet CPAP machine is from 2017, produces a high ai rleak and residual AHI of 11. 4/h    [name] has been 100% compliant use of CPAP with an average of over 12 hours of daily use, CPAP is set at 14 cmH2O pressure was 2 cm EPR, but currently he has a very high residual apnea index the AHI is 11.4 and this may be related to high air leakage with a maximum of 34 L/min.  I did not see any evidence of central apneas only obstructive apneas he still seems to snore sometimes through his CPAP.  His Mallompatti is still 3-4 , neck size 20" .BMI 34.79 - lost weight.    High geriatric depression score at 12/ 15 points- severely depressed.  How likely are you to doze in the following situations: 0 = not likely, 1 = slight chance, 2 = moderate chance, 3 = high chance  Sitting and Reading? Watching Television? Sitting inactive in a public place (theater or meeting)? Lying down in the afternoon when circumstances permit? Sitting and talking to someone? Sitting  quietly after lunch without alcohol? In a car, while stopped for a few minutes in traffic? As a passenger in a car for an hour without a break?  Total =7/ 24  FSS at 60-63 points . Remains very very high.      He has been using the CPAP ever since about 14 years ago he had is very first sleep study and was the result of the second set left tubes a prescription of his current machine. A [employer] manufactured [employer] CPAP. By appearance over 46 years old. [facility] that machine that the patient often also had to use mail orders to fill his supply needs.  He is definitely due for a new machine. In January 2014 over 3 years ago, he saw [name] here at the office for a neuropathy evaluation with foot drop. She concluded that this was either an HIV or diabetic induced neuropathy the results are scanned into epic for Dr. Everlene Farrier, at that time also prescribing physician. Mr. Chianese has no routines in terms of sleep wake rhythm. 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  Sleep habits are as follows: Some nights Mr. Hartlage may watch TV all night through another night he may go to bed as early as 8 or 9 PM. There is no established sleep routine. Mr. Scalia and his brother lives with her 60 year old aunt. He doesn't want to establish routines. He is not bothered by his living arrangements. His room is bedroom, Tv and computer room.  When he sleeps, he will be in bed by 3 or 4 AM and sleeps 7-8 hours . Eats oatmeal in AM and sometimes returns to bed to sleep there all night and day. He sleeps in all positions, 2 pillowes. His bedroom is not  cool , nor quiet - he used light blocking shades.  He has a illuminated bedroom, doesn't watch TV in the dark 240 watts.  His psychiatrist is Dr. Toy Care who has been trying to work on this for years he reports. He is disinterested in most things in life.    Sleep medical history and family sleep history:  Mr. 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 Social history: Mr. Cadden is currently unemployed, he drinks juices but no coffee or other "caffeinated  Beverages", he likes milk and ice cream. He than advised me , he  drinks iced tea -which has caffeine. Last year during I montage in [place] he left of a lot of fast food but couldn't get his beloved iced tea, his sugar diabetes decompensated completely. His weight was 208 pounds by October 2016 he reports. After his return he was placed on insulin.    Interval history from 09/05/2015. Mr. Dubree reports that he has received some steroid shots into his knees which has helped this pain he also has used a topical pain cream which has helped 2. He has seen my colleague [name]. He is here today for a compliance visit on CPAP after a split-night polysomnography from 06/17/2015. He was diagnosed with a moderate degree of apnea, his AHI was 20.2 and consisted almost entirely of shallow breathing spells not frank apnea. REM sleep was not seen. The patient slept in supine position for part of the night and his AHI went over 36.3. He was a loud snorer during the night in the sleep lab. He did not retain carbon dioxide, he had 49 minutes of desaturation time, he was titrated to only 6 cm water pressure was alleviated part of his apnea. I have ordered an auto titrated for him to see if he can further help hypoxemia. Insomnia was also clearly present but it was not caused by an organic sleep problem it was not the apnea causing insomnia. I will defer this treatment to Dr. Toy Care. His compliance report dated 09/03/2015 shows 100% compliance for days of use and 97% compliance for over 4 hours of nightly use currently he uses the machine on average 11 hours and 24 minutes  daily. His AHI is 1.5 at a pressure of 14 cm water with 2 cm EPR. This is an excellent result and he does not need to adjust that he has some  air leaks partially due to facial hair. He likes a full face mask.   Review of Systems: Out of a complete 14 system review, the patient complains of only the following symptoms, and all other reviewed systems are negative.  Epworth score 10  , Fatigue severity score  58 from 49  , depression score 14/15- indicative of major depression.   Family history update: Older Brother died at age 6 years-old in Aug 03, 2019, due to a MI.   Social History   Socioeconomic History  . Marital status: Single    Spouse name: Not on file  . Number of children: Not on file  . Years of education: Not on file  . Highest education level: Not on file  Occupational History    Comment: DISABILITY  Tobacco Use  . Smoking status: Former Smoker    Packs/day: 0.10    Years: 10.00    Pack years: 1.00    Types: Cigars    Quit date: 08/09/2014    Years since quitting: 5.0  . Smokeless tobacco: Never Used  Vaping Use  . Vaping Use: Never used  Substance and Sexual Activity  . Alcohol use: No    Alcohol/week: 0.0 standard drinks  . Drug use: No  . Sexual activity: Not Currently    Partners: Male    Comment: pt. declined condoms  Other Topics Concern  . Not on file  Social History Narrative   Epworth Sleepiness Scale = 7 (as of 03/16/2015)   Social Determinants of Health   Financial Resource Strain:   . Difficulty of Paying Living Expenses:   Food Insecurity:   . Worried About Charity fundraiser in the Last Year:   . Arboriculturist in the Last Year:   Transportation Needs:   . Film/video editor (Medical):   [name] Lack of Transportation (Non-Medical):   Physical Activity:   . Days of Exercise per Week:   . Minutes of Exercise per Session:   Stress:   . Feeling of Stress :   Social Connections:   . Frequency of Communication with Friends and Family:   . Frequency of Social Gatherings with Friends and Family:   . Attends Religious Services:   . Active Member of Clubs or Organizations:   .  Attends Archivist Meetings:   [name] Marital Status:   Intimate Partner Violence:   . Fear of Current or Ex-Partner:   . Emotionally Abused:   [name] Physically Abused:   . Sexually Abused:     Family History  Problem Relation Age of Onset  . Depression Brother   . Throat cancer Brother        half brother, never smoker  . COPD Mother   . Diabetes Neg Hx     Past Medical History:  Diagnosis Date  . ADHD (attention deficit hyperactivity disorder)   . Anxiety   . Chronic kidney disease   . Clotting disorder ([place])   . Depression   . Diabetes mellitus without complication ([place])   . Diabetes mellitus, type II ([place])   . Dizziness 03/17/2015  . Essential hypertension 06/25/2018  . GERD (gastroesophageal reflux disease)   . HIV disease ([place])   . HIV infection ([place])   . Liver disease   . OSA (obstructive sleep apnea)  07/25/2015   Uses CPAP regularly  . Peripheral vascular disease ([place])   . Ulcer     Past Surgical History:  Procedure Laterality Date  . AMPUTATION Right 10/02/2017   Procedure: RIGHT TRANSMETATARSAL AMPUTATION;  Surgeon: Leandrew Koyanagi, MD;  Location: [facility];  Service: Orthopedics;  Laterality: Right;  . SMALL INTESTINE SURGERY    . STOMACH SURGERY    . TOE AMPUTATION Right 08/2016   right great toe    Current Outpatient Medications  Medication Sig Dispense Refill  . abacavir-dolutegravir-lamiVUDine (TRIUMEQ) 600-50-300 MG tablet Take 1 tablet by mouth daily. 30 tablet 0  . acetaminophen (TYLENOL) 500 MG tablet Take 1,000-1,500 mg by mouth every 8 (eight) hours as needed for moderate pain.     [name] ALPRAZolam (XANAX) 1 MG tablet Take 1 mg by mouth 4 (four) times daily as needed for anxiety.     [name] amLODipine (NORVASC) 5 MG tablet Take 1 tablet (5 mg total) by mouth daily. 90 tablet 1  . amphetamine-dextroamphetamine (ADDERALL) 30 MG tablet Take 30 mg by mouth 3 (three) times daily.    . Brexpiprazole (REXULTI) 1 MG TABS Take 1 mg by mouth at bedtime.    .  calcium-vitamin D (OSCAL WITH D) 500-200 MG-UNIT tablet Take 1 tablet by mouth.    . canagliflozin (INVOKANA) 100 MG TABS tablet Take 1 tablet (100 mg total) by mouth daily before breakfast. 30 tablet 11  . Cholecalciferol (VITAMIN D3) 250 MCG (10000 UT) TABS Take by mouth.    . Continuous Blood Gluc Sensor (FREESTYLE LIBRE 14 DAY SENSOR) MISC USE AS DIRECTED EVERY 14 DAYS 6 each 3  . diclofenac Sodium (VOLTAREN) 1 % GEL APPLY 2 GRAMS TOPICALLY TO EACH KNEE IN THE MORNING AND AT BEDTIME, APPLY 1 GRAM TO EACH KNEE IN THE AFTERNOON 300 g 0  . divalproex (DEPAKOTE ER) 500 MG 24 hr tablet TAKE 1 TABLET(500 MG) BY MOUTH AT BEDTIME 90 tablet 1  . Dulaglutide (TRULICITY) 1.5 XI/5.0TU SOPN Inject 1.5 mg into the skin once a week. 12 pen 3  . ezetimibe (ZETIA) 10 MG tablet TAKE 1 TABLET(10 MG) BY MOUTH DAILY 90 tablet 1  . furosemide (LASIX) 40 MG tablet TAKE 1 TABLET(40 MG) BY MOUTH DAILY 90 tablet 1  . insulin aspart (NOVOLOG FLEXPEN) 100 UNIT/ML FlexPen 15 units with breakfast, and 25 unit with supper 15 mL 11  . insulin glargine (LANTUS SOLOSTAR) 100 UNIT/ML Solostar Pen Inject 170 Units into the skin every morning. 25 pen PRN  . levothyroxine (SYNTHROID, LEVOTHROID) 50 MCG tablet Take 1 tablet (50 mcg total) by mouth at bedtime. 30 tablet 11  . mupirocin ointment (BACTROBAN) 2 % Place 1 application into the nose 2 (two) times daily. 22 g 3  . ondansetron (ZOFRAN) 8 MG tablet Take 1 tablet (8 mg total) by mouth every 8 (eight) hours as needed for nausea or vomiting. 20 tablet 1  . pantoprazole (PROTONIX) 40 MG tablet TAKE 1 TABLET(40 MG) BY MOUTH DAILY AT 6 AM 90 tablet 1  . polyethylene glycol (MIRALAX / GLYCOLAX) packet Take 17 g by mouth 2 (two) times daily. 30 each 0  . pregabalin (LYRICA) 150 MG capsule Take 2 capsules (300 mg total) by mouth 2 (two) times daily. 360 capsule 1  . protriptyline (VIVACTIL) 10 MG tablet Take 10 mg by mouth 3 (three) times daily.   11  . rivaroxaban (XARELTO) 20 MG  TABS tablet TAKE 1 TABLET(20 MG) BY MOUTH DAILY 90 tablet 1  .  tiZANidine (ZANAFLEX) 4 MG tablet TAKE 1 TABLET(4 MG) BY MOUTH EVERY 6 HOURS AS NEEDED FOR MUSCLE SPASMS. DO NOT REFILL IN LESS THAN 30 DAYS 20 tablet 2  . UNABLE TO FIND CPAP MACHINE with standard Aclaim nasal mask with humidifier. Set at 14 cwp (Patient taking differently: CPAP MACHINE with standard Aclaim nasal mask with humidifier. Set at 4 cwp) 1 each 0  . Vortioxetine HBr (TRINTELLIX PO) Take 25 mg by mouth at bedtime.     [name] zolpidem (AMBIEN) 10 MG tablet Take 10 mg by mouth at bedtime.      No current facility-administered medications for this visit.    Allergies as of 08/24/2019 - Review Complete 08/24/2019  Allergen Reaction Noted  . Aspirin Swelling 06/20/2015  . Ibuprofen Swelling 06/28/2015  . Sustiva [efavirenz] Swelling and Rash 06/04/2006  . Nsaids Other (See Comments) 05/21/2015  . Lipitor [atorvastatin calcium] Other (See Comments) 08/25/2018    Vitals: BP 132/78   Pulse 90   Ht 5' 11.5" (1.816 m)   Wt 253 lb (114.8 kg)   BMI 34.79 kg/m  Last Weight:  Wt Readings from Last 1 Encounters:  08/24/19 253 lb (114.8 kg)   CHE:NIDP mass index is 34.79 kg/m.     Last Height:   Ht Readings from Last 1 Encounters:  08/24/19 5' 11.5" (1.816 m)    Physical exam:  General: The patient is awake, alert and appears not in acute distress. The patient is not  Groomed. He has full facial hair.  Head: Normocephalic, atraumatic. Neck is supple. Mallampati 4,  neck circumference: 18.5 . Nasal airflow  restricted , TMJ is evident . Retrognathia is not seen.  Cardiovascular:  Regular rate and rhythm , without  murmurs or carotid bruit, and without distended neck veins. Respiratory: Lungs are clear to auscultation. Skin:  Without evidence of edema, or rash Trunk: BMI is elevated . The patient's posture is not erect   Neurologic exam : The patient is awake and alert, oriented to place and time.   Memory subjective  described as intact.  Attention span & concentration ability appears normal.  Speech is fluent,  without dysarthria, dysphonia or aphasia.  Mood and affect are appropriate.  Cranial nerves: taste and smell are intact.  Pupils are equal and briskly reactive to light.  Extraocular movements  in vertical and horizontal planes intact Hearing impaired. Facial sensation intact Facial motor strength is symmetric and tongue and uvula move midline. Shoulder shrug was symmetrical.  Motor exam:  Normal tone, muscle bulk and symmetric strength in all extremities. Sensory:  Fine touch,and vibration were  normal. Coordination: Rapid alternating movements / Finger-to-nose maneuver normal without evidence of ataxia, dysmetria or tremor. Gait and station: not evaluated. Deep tendon reflexes: in the upper and lower extremities are symmetric and intact. Babinski maneuver response is downgoing.  The patient was advised of the nature of the diagnosed sleep disorder , the treatment options and risks for general a health and wellness arising from not treating the condition.  I spent more than 25 minutes of face to face time with the patient. Greater than 50% of time was spent in counseling and coordination of care. We have discussed the diagnosis and differential and I answered the patient's questions.     Assessment:  After physical and neurologic examination, review of laboratory studies,  Personal review of imaging studies, reports of other /same  Imaging studies ,  Results of polysomnography/ neurophysiology testing and pre-existing records as far as provided  in visit., my assessment is    1) OSA on CPAP- wants adjustments for comfort. Mask leaks air- that's related to facial hair.  2) Depression- highly impairing restorative sleep quality and causing insomnia, hypersomnia.    Plan:  Treatment plan and additional workup : I am not too optimistic that Mr Mirabal will learn to establish routines and sleep hygiene  after this has aparently been preached to him for 17 years.  He has a history of Apnea without any documentation in Epic.  Depression- followed by Dr Toy Care.  !!!!  I will change the autotitration capacity setting to 8- 18 cm water, 3 cm EPR with heated humidity. I think he will need to continue using a FFM, evan that a seal is elusive , given his facial hair.  Mr Boquet will be followed for sleep, not general neurology- for any general neurology he will need to see Dr [name].   I will send the new DME order-    Asencion Partridge Jenessa Gillingham MD  08/24/2019   CC: [name], [address] [place],  [place] [id number]

## 2019-08-24 NOTE — Patient Instructions (Signed)

## 2019-08-24 NOTE — Patient Instructions (Addendum)
I have sent a prescription to your pharmacy, to increase the Trulicity, and:  Please reduce the Lantus to 150 units each morning, and:    Please continue the same other diabetes medications.    check your blood sugar twice a day.  vary the time of day when you check, between before the 3 meals, and at bedtime.  also check if you have symptoms of your blood sugar being too high or too low.  please keep a record of the readings and bring it to your next appointment here (or you can bring the meter itself).  You can write it on any piece of paper.  please call us sooner if your blood sugar goes below 70, or if you have a lot of readings over 200.   Please come back for a follow-up appointment in 2 months.

## 2019-08-24 NOTE — Progress Notes (Signed)
Subjective:    Patient ID: Daniel [name], male    DOB: [birth year], 64 y.o.   MRN: [id number]  HPI Pt returns for f/u of diabetes mellitus: DM type: Insulin-requiring type 2.  Dx'ed: 1761 Complications: PN, PAD, CRI, and foot ulcers.   Therapy: insulin since 2016, and Invokana DKA: never Severe hypoglycemia: never.  Pancreatitis: never.   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   Other: he takes multiple daily injections, but basal insulin is emphasized, with improved results; he is retired.   Interval history: I reviewed continuous glucose monitor data.  Glucose varies from 65-230.  There is little trend throughout the day.  pt states he feels well in general.   Past Medical History:  Diagnosis Date  . ADHD (attention deficit hyperactivity disorder)   . Anxiety   . Chronic kidney disease   . Clotting disorder ([place])   . Depression   . Diabetes mellitus without complication ([place])   . Diabetes mellitus, type II ([place])   . Dizziness 03/17/2015  . Essential hypertension 06/25/2018  . GERD (gastroesophageal reflux disease)   . HIV disease ([place])   . HIV infection ([place])   . Liver disease   . OSA (obstructive sleep apnea) 07/25/2015   Uses CPAP regularly  . Peripheral vascular disease ([place])   . Ulcer     Past Surgical History:  Procedure Laterality Date  . AMPUTATION Right 10/02/2017   Procedure: RIGHT TRANSMETATARSAL AMPUTATION;  Surgeon: Leandrew Koyanagi, MD;  Location: [facility];  Service: Orthopedics;  Laterality: Right;  . SMALL INTESTINE SURGERY    . STOMACH SURGERY    . TOE AMPUTATION Right 08/2016   right great toe    Social History   Socioeconomic History  . Marital status: Single    Spouse name: Not on file  . Number of children: Not on file  . Years of education: Not on file  . Highest education level: Not on file  Occupational History    Comment: DISABILITY  Tobacco Use  . Smoking status: Former Smoker    Packs/day: 0.10     Years: 10.00    Pack years: 1.00    Types: Cigars    Quit date: 08/09/2014    Years since quitting: 5.0  . Smokeless tobacco: Never Used  Vaping Use  . Vaping Use: Never used  Substance and Sexual Activity  . Alcohol use: No    Alcohol/week: 0.0 standard drinks  . Drug use: No  . Sexual activity: Not Currently    Partners: Male    Comment: pt. declined condoms  Other Topics Concern  . Not on file  Social History Narrative   Epworth Sleepiness Scale = 7 (as of 03/16/2015)   Social Determinants of Health   Financial Resource Strain:   . Difficulty of Paying Living Expenses:   Food Insecurity:   . Worried About Charity fundraiser in the Last Year:   . Arboriculturist in the Last Year:   Transportation Needs:   . Film/video editor (Medical):   [name] Lack of Transportation (Non-Medical):   Physical Activity:   . Days of Exercise per Week:   . Minutes of Exercise per Session:   Stress:   . Feeling of Stress :   Social Connections:   . Frequency of Communication with Friends and Family:   . Frequency of Social Gatherings with Friends and Family:   . Attends Religious Services:   .  Active Member of Clubs or Organizations:   . Attends Archivist Meetings:   [name] Marital Status:   Intimate Partner Violence:   . Fear of Current or Ex-Partner:   . Emotionally Abused:   [name] Physically Abused:   . Sexually Abused:     Current Outpatient Medications on File Prior to Visit  Medication Sig Dispense Refill  . abacavir-dolutegravir-lamiVUDine (TRIUMEQ) 600-50-300 MG tablet Take 1 tablet by mouth daily. 30 tablet 0  . acetaminophen (TYLENOL) 500 MG tablet Take 1,000-1,500 mg by mouth every 8 (eight) hours as needed for moderate pain.     [name] ALPRAZolam (XANAX) 1 MG tablet Take 1 mg by mouth 4 (four) times daily as needed for anxiety.     [name] amLODipine (NORVASC) 5 MG tablet Take 1 tablet (5 mg total) by mouth daily. 90 tablet 1  . amphetamine-dextroamphetamine (ADDERALL) 30 MG  tablet Take 30 mg by mouth 3 (three) times daily.    . Brexpiprazole (REXULTI) 1 MG TABS Take 1 mg by mouth at bedtime.    . calcium-vitamin D (OSCAL WITH D) 500-200 MG-UNIT tablet Take 1 tablet by mouth.    . canagliflozin (INVOKANA) 100 MG TABS tablet Take 1 tablet (100 mg total) by mouth daily before breakfast. 30 tablet 11  . Cholecalciferol (VITAMIN D3) 250 MCG (10000 UT) TABS Take by mouth.    . Continuous Blood Gluc Sensor (FREESTYLE LIBRE 14 DAY SENSOR) MISC USE AS DIRECTED EVERY 14 DAYS 6 each 3  . divalproex (DEPAKOTE ER) 500 MG 24 hr tablet TAKE 1 TABLET(500 MG) BY MOUTH AT BEDTIME 90 tablet 1  . ezetimibe (ZETIA) 10 MG tablet TAKE 1 TABLET(10 MG) BY MOUTH DAILY 90 tablet 1  . furosemide (LASIX) 40 MG tablet TAKE 1 TABLET(40 MG) BY MOUTH DAILY 90 tablet 1  . insulin aspart (NOVOLOG FLEXPEN) 100 UNIT/ML FlexPen 15 units with breakfast, and 25 unit with supper 15 mL 11  . levothyroxine (SYNTHROID, LEVOTHROID) 50 MCG tablet Take 1 tablet (50 mcg total) by mouth at bedtime. 30 tablet 11  . mupirocin ointment (BACTROBAN) 2 % Place 1 application into the nose 2 (two) times daily. 22 g 3  . ondansetron (ZOFRAN) 8 MG tablet Take 1 tablet (8 mg total) by mouth every 8 (eight) hours as needed for nausea or vomiting. 20 tablet 1  . pantoprazole (PROTONIX) 40 MG tablet TAKE 1 TABLET(40 MG) BY MOUTH DAILY AT 6 AM 90 tablet 1  . polyethylene glycol (MIRALAX / GLYCOLAX) packet Take 17 g by mouth 2 (two) times daily. 30 each 0  . pregabalin (LYRICA) 150 MG capsule Take 2 capsules (300 mg total) by mouth 2 (two) times daily. 360 capsule 1  . protriptyline (VIVACTIL) 10 MG tablet Take 10 mg by mouth 3 (three) times daily.   11  . rivaroxaban (XARELTO) 20 MG TABS tablet TAKE 1 TABLET(20 MG) BY MOUTH DAILY 90 tablet 1  . tiZANidine (ZANAFLEX) 4 MG tablet TAKE 1 TABLET(4 MG) BY MOUTH EVERY 6 HOURS AS NEEDED FOR MUSCLE SPASMS. DO NOT REFILL IN LESS THAN 30 DAYS 20 tablet 2  . UNABLE TO FIND CPAP MACHINE with  standard Aclaim nasal mask with humidifier. Set at 14 cwp (Patient taking differently: CPAP MACHINE with standard Aclaim nasal mask with humidifier. Set at 4 cwp) 1 each 0  . Vortioxetine HBr (TRINTELLIX PO) Take 25 mg by mouth at bedtime.     [name] zolpidem (AMBIEN) 10 MG tablet Take 10 mg by mouth at bedtime.  No current facility-administered medications on file prior to visit.    Allergies  Allergen Reactions  . Aspirin Swelling  . Ibuprofen Swelling  . Sustiva [Efavirenz] Swelling and Rash  . Nsaids Other (See Comments)    unknwn  . Lipitor [Atorvastatin Calcium] Other (See Comments)    Leg pain    Family History  Problem Relation Age of Onset  . Depression Brother   . Throat cancer Brother        half brother, never smoker  . COPD Mother   . Diabetes Neg Hx     BP 116/60   Pulse 82   Ht 5' 11.5" (1.816 m)   Wt 251 lb 9.6 oz (114.1 kg)   SpO2 94%   BMI 34.60 kg/m    Review of Systems Denies LOC    Objective:   Physical Exam VITAL SIGNS:  See vs page GENERAL: no distress Pulses: left foot pulses are intact.    MSK: no deformity of the feet or ankles, except for right TMA.     CV: 1+ bilat edema of the legs.   Skin:  normal color and temp on the feet and ankles.  No erythema.  No ulcer, but there are bilat heavy calluses.  Neuro: sensation is intact to touch on the feet and ankles, but severely decreased from normal.   There is onychomycosis of the left foot toenails.   Lab Results  Component Value Date   HGBA1C 6.2 (A) 08/24/2019        Assessment & Plan:  Insulin-requiring type 2 DM. Hypoglycemia, due to insulin: we'll reduce in favor of Trulicity  Patient Instructions  I have sent a prescription to your pharmacy, to increase the Trulicity, and:  Please reduce the Lantus to 150 units each morning, and:    Please continue the same other diabetes medications.    check your blood sugar twice a day.  vary the time of day when you check, between before  the 3 meals, and at bedtime.  also check if you have symptoms of your blood sugar being too high or too low.  please keep a record of the readings and bring it to your next appointment here (or you can bring the meter itself).  You can write it on any piece of paper.  please call us sooner if your blood sugar goes below 70, or if you have a lot of readings over 200.   Please come back for a follow-up appointment in 2 months.

## 2019-08-25 ENCOUNTER — Other Ambulatory Visit: Payer: Self-pay

## 2019-08-25 ENCOUNTER — Encounter: Payer: Self-pay | Admitting: Family Medicine

## 2019-08-25 ENCOUNTER — Ambulatory Visit (INDEPENDENT_AMBULATORY_CARE_PROVIDER_SITE_OTHER): Payer: PPO | Admitting: Family Medicine

## 2019-08-25 VITALS — BP 118/76 | HR 85 | Temp 98.4°F | Ht 71.5 in | Wt 252.0 lb

## 2019-08-25 DIAGNOSIS — M25561 Pain in right knee: Secondary | ICD-10-CM | POA: Diagnosis not present

## 2019-08-25 DIAGNOSIS — E1122 Type 2 diabetes mellitus with diabetic chronic kidney disease: Secondary | ICD-10-CM | POA: Diagnosis not present

## 2019-08-25 DIAGNOSIS — G8929 Other chronic pain: Secondary | ICD-10-CM

## 2019-08-25 DIAGNOSIS — M25562 Pain in left knee: Secondary | ICD-10-CM | POA: Diagnosis not present

## 2019-08-25 DIAGNOSIS — M5441 Lumbago with sciatica, right side: Secondary | ICD-10-CM

## 2019-08-25 DIAGNOSIS — G720 Drug-induced myopathy: Secondary | ICD-10-CM

## 2019-08-25 DIAGNOSIS — Z794 Long term (current) use of insulin: Secondary | ICD-10-CM

## 2019-08-25 DIAGNOSIS — R251 Tremor, unspecified: Secondary | ICD-10-CM

## 2019-08-25 DIAGNOSIS — N183 Chronic kidney disease, stage 3 unspecified: Secondary | ICD-10-CM

## 2019-08-25 DIAGNOSIS — E1142 Type 2 diabetes mellitus with diabetic polyneuropathy: Secondary | ICD-10-CM | POA: Diagnosis not present

## 2019-08-25 DIAGNOSIS — T466X5A Adverse effect of antihyperlipidemic and antiarteriosclerotic drugs, initial encounter: Secondary | ICD-10-CM

## 2019-08-25 MED ORDER — DICLOFENAC SODIUM 1 % EX GEL: CUTANEOUS | 0 refills | Status: DC

## 2019-08-25 NOTE — Progress Notes (Addendum)
Subjective:  Patient ID: Daniel [name], male    DOB: 07-Oct-1955  Age: 64 y.o. MRN: [id number]  CC:  Chief Complaint  Patient presents with  . Follow-up    on R lower back pain. Pt state no change since last OV other than pt has a new cyiatic type of pain in the same general area. Pt states when he is seated for longe periods of time he gets a sharp pain that shoots from his lower back on his R side o behind the R knee. Pt states he has to get up and walk around after long periods of sitting.    HPI Daniel [name] presents for   Right low back pain: Discussed March 25. Started 2 weeks prior. Right-sided sciatic symptoms at that time. Suspected flare of degenerative disc disease/degenerative changes. Was referred to physical therapy.  Treated with gabapentin, and plan for physical therapy. History of chronic peripheral neuropathic pain, diabetic peripheral neuropathy. Increased symptoms at March 25 visit, but had responded to higher dose of gabapentin 300 mg twice daily. Cautioned about pedal edema at that dose but was continued on same.  Planned 3 week follow up.  "does not get along with PT". Did not feel like it helped in past. Not sure if able to return o prior PT - dismissed due to missed visits.   Still on gabapentin 348m BID.  Pain comes and goes - deep in R low back, unable to press on specific area. Tylenol makes it better. Tizanidine at night only as needed.   Other pain shooting down R leg, R buttocks - notices if sitting in chair for too long.  No bowel or bladder incontinence, no saddle anesthesia, no new lower extremity weakness. Tylenol morning and night helps some.   Using diclofenac gel for knee pain - BID.  Ortho Dr. XErlinda Hong  Feels like diclofenac gel helps real good for knees.   At end of visit asked about Parkinsons, has had some left arm tremor.  We did discuss some resting tremor back in 2019.  He is followed by Dr. DBrett Fairywith neurology for obstructive sleep  apnea. Same tremor past few years - maybe a little more. Has not discussed with neuro.     History Patient Active Problem List   Diagnosis Date Noted  . Severe major depression ([place] 11/17/2018  . Class 2 obesity due to excess calories without serious comorbidity with body mass index (BMI) of 36.0 to 36.9 in adult 11/17/2018  . Diabetes ([place] 11/09/2018  . Anticoagulated 08/24/2018  . Dyslipidemia 07/27/2018  . Essential hypertension 06/25/2018  . Hypogonadism in male 05/10/2018  . Numbness 03/31/2018  . Diabetic peripheral neuropathy ([place] 01/25/2018  . History of syncope 01/25/2018  . S/P transmetatarsal amputation of foot, right ([place] 10/09/2017  . Osteomyelitis ([place]   . Hypothyroidism   . Constipation   . History of DVT (deep vein thrombosis) 09/30/2017  . Morbid obesity due to excess calories ([place] complicated by DM / hyperlipidemia 01/15/2017  . DOE (dyspnea on exertion) 01/13/2017  . Chronic migraine 09/17/2016  . Gait abnormality 09/17/2016  . Diabetic foot infection ([place] 06/28/2016  . Fall 12/05/2015  . Toe ulcer, right ([place] 09/19/2015  . Decreased pedal pulses 09/19/2015  . OSA on CPAP 09/05/2015  . Major depressive disorder, recurrent episode, moderate ([place] 09/05/2015  . Low back pain 06/11/2015  . Abnormality of gait 06/11/2015  . Dizziness 03/17/2015  . Weakness 02/21/2015  . Chronic renal insufficiency, stage III (  moderate) ([place]) 08/09/2014  . Insulin-requiring or dependent type II diabetes mellitus ([place]) 11/07/2013  . Hematuria 06/21/2013  . Hepatic steatosis 09/09/2010  . Human immunodeficiency virus (HIV) disease ([place]) 06/04/2006  . HERPES ZOSTER, UNCOMPLICATED 16/11/9602  . Depression 06/04/2006  . THROMBOPHLEBITIS NOS 06/04/2006  . GERD 06/04/2006  . ARTHRITIS, HAND 06/04/2006   Past Medical History:  Diagnosis Date  . ADHD (attention deficit hyperactivity disorder)   . Anxiety   . Chronic kidney disease   . Clotting disorder ([place])   .  Depression   . Diabetes mellitus without complication ([place])   . Diabetes mellitus, type II ([place])   . Dizziness 03/17/2015  . Essential hypertension 06/25/2018  . GERD (gastroesophageal reflux disease)   . HIV disease ([place])   . HIV infection ([place])   . Liver disease   . OSA (obstructive sleep apnea) 07/25/2015   Uses CPAP regularly  . Peripheral vascular disease ([place])   . Ulcer    Past Surgical History:  Procedure Laterality Date  . AMPUTATION Right 10/02/2017   Procedure: RIGHT TRANSMETATARSAL AMPUTATION;  Surgeon: Leandrew Koyanagi, MD;  Location: [facility];  Service: Orthopedics;  Laterality: Right;  . SMALL INTESTINE SURGERY    . STOMACH SURGERY    . TOE AMPUTATION Right 08/2016   right great toe   Allergies  Allergen Reactions  . Aspirin Swelling  . Ibuprofen Swelling  . Sustiva [Efavirenz] Swelling and Rash  . Nsaids Other (See Comments)    unknwn  . Lipitor [Atorvastatin Calcium] Other (See Comments)    Leg pain   Prior to Admission medications   Medication Sig Start Date End Date Taking? Authorizing Provider  abacavir-dolutegravir-lamiVUDine (TRIUMEQ) 600-50-300 MG tablet Take 1 tablet by mouth daily. 08/03/19  Yes Comer, Okey Regal, MD  acetaminophen (TYLENOL) 500 MG tablet Take 1,000-1,500 mg by mouth every 8 (eight) hours as needed for moderate pain.    Yes [provider]  ALPRAZolam Duanne Moron) 1 MG tablet Take 1 mg by mouth 4 (four) times daily as needed for anxiety.    Yes [provider]  amLODipine (NORVASC) 5 MG tablet Take 1 tablet (5 mg total) by mouth daily. 05/19/19  Yes [name], MD  amphetamine-dextroamphetamine (ADDERALL) 30 MG tablet Take 30 mg by mouth 3 (three) times daily.   Yes [provider]  Brexpiprazole (REXULTI) 1 MG TABS Take 1 mg by mouth at bedtime.   Yes [provider]  calcium-vitamin D (OSCAL WITH D) 500-200 MG-UNIT tablet Take 1 tablet by mouth.   Yes [provider]  canagliflozin (INVOKANA) 100 MG  TABS tablet Take 1 tablet (100 mg total) by mouth daily before breakfast. 02/01/19  Yes Renato Shin, MD  Cholecalciferol (VITAMIN D3) 250 MCG (10000 UT) TABS Take by mouth.   Yes [provider]  Continuous Blood Gluc Sensor (FREESTYLE LIBRE 14 DAY SENSOR) MISC USE AS DIRECTED EVERY 14 DAYS 06/06/19  Yes Renato Shin, MD  diclofenac Sodium (VOLTAREN) 1 % GEL APPLY 2 GRAMS TOPICALLY TO EACH KNEE IN THE MORNING AND AT BEDTIME, APPLY 1 GRAM TO EACH KNEE IN THE AFTERNOON 04/11/19  Yes [name], MD  divalproex (DEPAKOTE ER) 500 MG 24 hr tablet TAKE 1 TABLET(500 MG) BY MOUTH AT BEDTIME 05/06/19  Yes [name], MD  Dulaglutide (TRULICITY) 3 VW/0.9WJ SOPN Inject 0.5 mLs (3 mg total) into the skin once a week. 08/24/19  Yes Renato Shin, MD  ezetimibe (ZETIA) 10 MG tablet TAKE 1 TABLET(10 MG)  BY MOUTH DAILY 05/19/19  Yes [name], MD  furosemide (LASIX) 40 MG tablet TAKE 1 TABLET(40 MG) BY MOUTH DAILY 05/19/19  Yes [name], MD  insulin aspart (NOVOLOG FLEXPEN) 100 UNIT/ML FlexPen 15 units with breakfast, and 25 unit with supper 11/08/18  Yes Renato Shin, MD  insulin glargine (LANTUS SOLOSTAR) 100 UNIT/ML Solostar Pen Inject 130 Units into the skin every morning. 08/24/19  Yes Renato Shin, MD  levothyroxine (SYNTHROID, LEVOTHROID) 50 MCG tablet Take 1 tablet (50 mcg total) by mouth at bedtime. 07/24/15  Yes [name], MD  mupirocin ointment (BACTROBAN) 2 % Place 1 application into the nose 2 (two) times daily. 06/10/18  Yes Leandrew Koyanagi, MD  ondansetron (ZOFRAN) 8 MG tablet Take 1 tablet (8 mg total) by mouth every 8 (eight) hours as needed for nausea or vomiting. 07/05/19  Yes Comer, Okey Regal, MD  pantoprazole (PROTONIX) 40 MG tablet TAKE 1 TABLET(40 MG) BY MOUTH DAILY AT 6 AM 05/19/19  Yes [name], MD  polyethylene glycol (MIRALAX / GLYCOLAX) packet Take 17 g by mouth 2 (two) times daily. 10/05/17  Yes Eugenie Filler, MD  pregabalin (LYRICA) 150 MG  capsule Take 2 capsules (300 mg total) by mouth 2 (two) times daily. 05/19/19  Yes [name], MD  protriptyline (VIVACTIL) 10 MG tablet Take 10 mg by mouth 3 (three) times daily.  01/30/16  Yes [provider]  rivaroxaban (XARELTO) 20 MG TABS tablet TAKE 1 TABLET(20 MG) BY MOUTH DAILY 05/19/19  Yes [name], MD  tiZANidine (ZANAFLEX) 4 MG tablet TAKE 1 TABLET(4 MG) BY MOUTH EVERY 6 HOURS AS NEEDED FOR MUSCLE SPASMS. DO NOT REFILL IN LESS THAN 30 DAYS 09/15/18  Yes Marcial Pacas, MD  UNABLE TO FIND CPAP MACHINE with standard Aclaim nasal mask with humidifier. Set at 14 cwp Patient taking differently: CPAP MACHINE with standard Aclaim nasal mask with humidifier. Set at 4 cwp 04/12/13  Yes Daub, Loura Back, MD  Vortioxetine HBr (TRINTELLIX PO) Take 25 mg by mouth at bedtime.  01/26/15  Yes [provider]  zolpidem (AMBIEN) 10 MG tablet Take 10 mg by mouth at bedtime.    Yes [provider]   Social History   Socioeconomic History  . Marital status: Single    Spouse name: Not on file  . Number of children: Not on file  . Years of education: Not on file  . Highest education level: Not on file  Occupational History    Comment: DISABILITY  Tobacco Use  . Smoking status: Former Smoker    Packs/day: 0.10    Years: 10.00    Pack years: 1.00    Types: Cigars    Quit date: 08/09/2014    Years since quitting: 5.0  . Smokeless tobacco: Never Used  Vaping Use  . Vaping Use: Never used  Substance and Sexual Activity  . Alcohol use: No    Alcohol/week: 0.0 standard drinks  . Drug use: No  . Sexual activity: Not Currently    Partners: Male    Comment: pt. declined condoms  Other Topics Concern  . Not on file  Social History Narrative   Epworth Sleepiness Scale = 7 (as of 03/16/2015)   Social Determinants of Health   Financial Resource Strain:   . Difficulty of Paying Living Expenses:   Food Insecurity:   . Worried About Charity fundraiser in the Last  Year:   . [employer]  in the Last Year:   Transportation Needs:   . Film/video editor (Medical):   [name] Lack of Transportation (Non-Medical):   Physical Activity:   . Days of Exercise per Week:   . Minutes of Exercise per Session:   Stress:   . Feeling of Stress :   Social Connections:   . Frequency of Communication with Friends and Family:   . Frequency of Social Gatherings with Friends and Family:   . Attends Religious Services:   . Active Member of Clubs or Organizations:   . Attends Archivist Meetings:   [name] Marital Status:   Intimate Partner Violence:   . Fear of Current or Ex-Partner:   . Emotionally Abused:   [name] Physically Abused:   . Sexually Abused:     Review of Systems Per HPI.   Objective:   Vitals:   08/25/19 1553  BP: 118/76  Pulse: 85  Temp: 98.4 F (36.9 C)  TempSrc: Temporal  SpO2: 94%  Weight: 252 lb (114.3 kg)  Height: 5' 11.5" (1.816 m)     Physical Exam Vitals reviewed.  Constitutional:      General: He is not in acute distress.    Appearance: He is well-developed.  HENT:     Head: Normocephalic and atraumatic.  Eyes:     Pupils: Pupils are equal, round, and reactive to light.  Neck:     Vascular: No carotid bruit or JVD.  Cardiovascular:     Rate and Rhythm: Normal rate and regular rhythm.     Heart sounds: Normal heart sounds. No murmur heard.   Pulmonary:     Effort: Pulmonary effort is normal.     Breath sounds: Normal breath sounds. No rales.  Musculoskeletal:     Comments: No focal bony tenderness of lumbar spine.  Somewhat decreased flexion, right and left lateral flexion with some discomfort in the lower back.  Able to ambulate without difficulty.  Skin:    General: Skin is warm and dry.  Neurological:     Mental Status: He is alert and oriented to person, place, and time.     Comments: Left greater than right resting tremor.  Hands.        Assessment & Plan:  Daniel [name] is a 64 y.o. male  . Right-sided low back pain with right-sided sciatica, unspecified chronicity - Plan: Ambulatory referral to Spine Surgery  -Persistent low back pain.  Unfortunately did not try physical therapy as recommended previously.  Will refer to back specialist decide on advanced imaging versus trial of PT versus other treatment options.  RTC precautions if acute worsening  Diabetic peripheral neuropathy ([name])  -Persistent, treated with gabapentin.  No changes at this time.  Chronic pain of both knees - Plan: diclofenac Sodium (VOLTAREN) 1 % GEL  -Stable with diclofenac, has orthopedic follow-up as needed, previously received injections.  Type 2 diabetes mellitus with stage 3 chronic kidney disease, with long-term current use of insulin, unspecified whether stage 3a or 3b CKD ([place]) - Plan: Microalbumin / creatinine urine ratio  -Diabetes followed by endocrinology, will check microalbumin creatinine ratio.  -not on statin as prior myopathy/myalgias and elevated LFT's.   Hand tremors  -Have been present previously.  Possible resting tremor.  Will have him discuss with neurology, new referral can be placed as needed but already patient at [facility] neuro.  Meds ordered this encounter  Medications  . diclofenac Sodium (VOLTAREN) 1 % GEL    Sig: APPLY 2 GRAMS  TOPICALLY TO EACH KNEE IN THE MORNING AND AT BEDTIME, APPLY 1 GRAM TO EACH KNEE IN THE AFTERNOON    Dispense:  300 g    Refill:  0   Patient Instructions    I will refer you to back specialist at [facility] care to decide on next step for your back and leg pain.  They can discuss whether advanced imaging such as MRI, physical therapy, injections or other treatments may be helpful.  No other medication changes for now.  I did refill your diclofenac for knees.  Please discuss the arm/hand tremors with your neurologist Dr. Brett Fairy.  If they need a specific referral and happy to place 1.  If any acute changes or worsening, be seen here or other  medical provider right away.     If you have lab work done today you will be contacted with your lab results within the next 2 weeks.  If you have not heard from [name] then please contact us. The fastest way to get your results is to register for My Chart.   IF you received an x-ray today, you will receive an invoice from [facility]. Please contact [facility] at [phone] with questions or concerns regarding your invoice.   IF you received labwork today, you will receive an invoice from [facility]. Please contact [facility] at [phone] with questions or concerns regarding your invoice.   Our billing staff will not be able to assist you with questions regarding bills from these companies.  You will be contacted with the lab results as soon as they are available. The fastest way to get your results is to activate your My Chart account. Instructions are located on the last page of this paperwork. If you have not heard from [name] regarding the results in 2 weeks, please contact this office.         Signed, [name], MD [facility]

## 2019-08-25 NOTE — Patient Instructions (Addendum)
  I will refer you to back specialist at [facility] to decide on next step for your back and leg pain.  They can discuss whether advanced imaging such as MRI, physical therapy, injections or other treatments may be helpful.  No other medication changes for now.  I did refill your diclofenac for knees.  Please discuss the arm/hand tremors with your neurologist Dr. Brett Fairy.  If they need a specific referral and happy to place 1.  If any acute changes or worsening, be seen here or other medical provider right away.     If you have lab work done today you will be contacted with your lab results within the next 2 weeks.  If you have not heard from [name] then please contact us. The fastest way to get your results is to register for My Chart.   IF you received an x-ray today, you will receive an invoice from [facility]. Please contact [facility] at [phone] with questions or concerns regarding your invoice.   IF you received labwork today, you will receive an invoice from [facility]. Please contact [facility] at [phone] with questions or concerns regarding your invoice.   Our billing staff will not be able to assist you with questions regarding bills from these companies.  You will be contacted with the lab results as soon as they are available. The fastest way to get your results is to activate your My Chart account. Instructions are located on the last page of this paperwork. If you have not heard from [name] regarding the results in 2 weeks, please contact this office.

## 2019-08-26 LAB — MICROALBUMIN / CREATININE URINE RATIO
Creatinine, Urine: 59.8 mg/dL
Microalb/Creat Ratio: 69 mg/g creat — ABNORMAL HIGH (ref 0–29)
Microalbumin, Urine: 41.1 ug/mL

## 2019-08-29 ENCOUNTER — Encounter: Payer: Self-pay | Admitting: Family Medicine

## 2019-08-30 ENCOUNTER — Other Ambulatory Visit: Payer: Self-pay | Admitting: Endocrinology

## 2019-08-30 ENCOUNTER — Other Ambulatory Visit: Payer: Self-pay | Admitting: Internal Medicine

## 2019-08-30 DIAGNOSIS — Z794 Long term (current) use of insulin: Secondary | ICD-10-CM

## 2019-08-30 DIAGNOSIS — E0849 Diabetes mellitus due to underlying condition with other diabetic neurological complication: Secondary | ICD-10-CM

## 2019-09-01 ENCOUNTER — Other Ambulatory Visit: Payer: Self-pay | Admitting: Internal Medicine

## 2019-09-01 DIAGNOSIS — B20 Human immunodeficiency virus [HIV] disease: Secondary | ICD-10-CM

## 2019-09-13 ENCOUNTER — Ambulatory Visit: Payer: PPO

## 2019-09-13 ENCOUNTER — Ambulatory Visit: Payer: PPO | Admitting: Family

## 2019-09-13 ENCOUNTER — Other Ambulatory Visit: Payer: Self-pay

## 2019-09-13 ENCOUNTER — Encounter: Payer: Self-pay | Admitting: Family

## 2019-09-13 VITALS — BP 111/71 | HR 98 | Temp 98.7°F | Wt 254.0 lb

## 2019-09-13 DIAGNOSIS — B20 Human immunodeficiency virus [HIV] disease: Secondary | ICD-10-CM | POA: Diagnosis not present

## 2019-09-13 MED ORDER — TRIUMEQ 600-50-300 MG PO TABS: 1.0000 | ORAL_TABLET | Freq: Every day | ORAL | 6 refills | Status: DC

## 2019-09-13 NOTE — Progress Notes (Signed)
Subjective:    Patient ID: Daniel [name], male    DOB: 12-03-1955, 64 y.o.   MRN: [id number]  Chief Complaint  Patient presents with  . Follow-up    B20     HPI:  Daniel [name] is a 64 y.o. male with HIV disease who was last seen in the office on 02/09/19 with good adherence and tolerance to his ART regimen of the Triumeq. Viral load at the time was undetectable with CD4 count of 891. Here today for routine follow up.  Mr. Gartman continues to take his Triumeq as prescribed with no adverse side effects or missed doses. Overall feeling okay today. Brother died about 1 month ago. Denies fevers, chills, night sweats, headaches, changes in vision, neck pain/stiffness, nausea, diarrhea, vomiting, lesions or rashes.  Mr. Bellanca has no problems obtaining his medication from the pharmacy and remains covered through [facility] [employer] and [employer]. Has been feeling down since his brother died about a month ago. Denies any recreational or illicit drug use, tobacco use, or alcohol consumption. Continues to take care of his aunt.    Allergies  Allergen Reactions  . Aspirin Swelling  . Ibuprofen Swelling  . Sustiva [Efavirenz] Swelling and Rash  . Nsaids Other (See Comments)    unknwn  . Lipitor [Atorvastatin Calcium] Other (See Comments)    Leg pain      Outpatient Medications Prior to Visit  Medication Sig Dispense Refill  . acetaminophen (TYLENOL) 500 MG tablet Take 1,000-1,500 mg by mouth every 8 (eight) hours as needed for moderate pain.     [name] ALPRAZolam (XANAX) 1 MG tablet Take 1 mg by mouth 4 (four) times daily as needed for anxiety.     [name] amLODipine (NORVASC) 5 MG tablet Take 1 tablet (5 mg total) by mouth daily. 90 tablet 1  . amphetamine-dextroamphetamine (ADDERALL) 30 MG tablet Take 30 mg by mouth 3 (three) times daily.    . Brexpiprazole (REXULTI) 1 MG TABS Take 1 mg by mouth at bedtime.    . calcium-vitamin D (OSCAL WITH D) 500-200 MG-UNIT tablet Take 1 tablet by mouth.    .  canagliflozin (INVOKANA) 100 MG TABS tablet Take 1 tablet (100 mg total) by mouth daily before breakfast. 30 tablet 11  . Cholecalciferol (VITAMIN D3) 250 MCG (10000 UT) TABS Take by mouth.    . Continuous Blood Gluc Sensor (FREESTYLE LIBRE 14 DAY SENSOR) MISC USE AS DIRECTED EVERY 14 DAYS 6 each 3  . diclofenac Sodium (VOLTAREN) 1 % GEL APPLY 2 GRAMS TOPICALLY TO EACH KNEE IN THE MORNING AND AT BEDTIME, APPLY 1 GRAM TO EACH KNEE IN THE AFTERNOON 300 g 0  . divalproex (DEPAKOTE ER) 500 MG 24 hr tablet TAKE 1 TABLET(500 MG) BY MOUTH AT BEDTIME 90 tablet 1  . Dulaglutide (TRULICITY) 3 VH/8.4ON SOPN Inject 0.5 mLs (3 mg total) into the skin once a week. 12 pen 3  . ezetimibe (ZETIA) 10 MG tablet TAKE 1 TABLET(10 MG) BY MOUTH DAILY 90 tablet 1  . furosemide (LASIX) 40 MG tablet TAKE 1 TABLET(40 MG) BY MOUTH DAILY 90 tablet 1  . insulin aspart (NOVOLOG FLEXPEN) 100 UNIT/ML FlexPen 15 units with breakfast, and 25 unit with supper 15 mL 11  . insulin glargine (LANTUS SOLOSTAR) 100 UNIT/ML Solostar Pen Inject 130 Units into the skin every morning. 25 pen PRN  . Insulin Pen Needle (B-D ULTRAFINE III SHORT PEN) 31G X 8 MM MISC 1 each by Other route 3 (three) times daily.  E11.9 100 each 2  . levothyroxine (SYNTHROID, LEVOTHROID) 50 MCG tablet Take 1 tablet (50 mcg total) by mouth at bedtime. 30 tablet 11  . mupirocin ointment (BACTROBAN) 2 % Place 1 application into the nose 2 (two) times daily. 22 g 3  . ondansetron (ZOFRAN) 8 MG tablet TAKE 1 TABLET(8 MG) BY MOUTH EVERY 8 HOURS AS NEEDED FOR NAUSEA OR VOMITING 20 tablet 1  . pantoprazole (PROTONIX) 40 MG tablet TAKE 1 TABLET(40 MG) BY MOUTH DAILY AT 6 AM 90 tablet 1  . polyethylene glycol (MIRALAX / GLYCOLAX) packet Take 17 g by mouth 2 (two) times daily. 30 each 0  . pregabalin (LYRICA) 150 MG capsule Take 2 capsules (300 mg total) by mouth 2 (two) times daily. 360 capsule 1  . protriptyline (VIVACTIL) 10 MG tablet Take 10 mg by mouth 3 (three) times daily.    11  . rivaroxaban (XARELTO) 20 MG TABS tablet TAKE 1 TABLET(20 MG) BY MOUTH DAILY 90 tablet 1  . tiZANidine (ZANAFLEX) 4 MG tablet TAKE 1 TABLET(4 MG) BY MOUTH EVERY 6 HOURS AS NEEDED FOR MUSCLE SPASMS. DO NOT REFILL IN LESS THAN 30 DAYS 20 tablet 2  . UNABLE TO FIND CPAP MACHINE with standard Aclaim nasal mask with humidifier. Set at 14 cwp (Patient taking differently: CPAP MACHINE with standard Aclaim nasal mask with humidifier. Set at 4 cwp) 1 each 0  . Vortioxetine HBr (TRINTELLIX PO) Take 25 mg by mouth at bedtime.     [name] zolpidem (AMBIEN) 10 MG tablet Take 10 mg by mouth at bedtime.     . TRIUMEQ 600-50-300 MG tablet TAKE 1 TABLET BY MOUTH DAILY 30 tablet 0   No facility-administered medications prior to visit.     Past Medical History:  Diagnosis Date  . ADHD (attention deficit hyperactivity disorder)   . Anxiety   . Chronic kidney disease   . Clotting disorder ([place])   . Depression   . Diabetes mellitus without complication ([place])   . Diabetes mellitus, type II ([place])   . Dizziness 03/17/2015  . Essential hypertension 06/25/2018  . GERD (gastroesophageal reflux disease)   . HIV disease ([place])   . HIV infection ([place])   . Liver disease   . OSA (obstructive sleep apnea) 07/25/2015   Uses CPAP regularly  . Peripheral vascular disease ([place])   . Ulcer      Past Surgical History:  Procedure Laterality Date  . AMPUTATION Right 10/02/2017   Procedure: RIGHT TRANSMETATARSAL AMPUTATION;  Surgeon: Leandrew Koyanagi, MD;  Location: [facility];  Service: Orthopedics;  Laterality: Right;  . SMALL INTESTINE SURGERY    . STOMACH SURGERY    . TOE AMPUTATION Right 08/2016   right great toe       Review of Systems  Constitutional: Negative for appetite change, chills, fatigue, fever and unexpected weight change.  Eyes: Negative for visual disturbance.  Respiratory: Negative for cough, chest tightness, shortness of breath and wheezing.   Cardiovascular: Negative for chest pain and leg swelling.    Gastrointestinal: Negative for abdominal pain, constipation, diarrhea, nausea and vomiting.  Genitourinary: Negative for dysuria, flank pain, frequency, genital sores, hematuria and urgency.  Skin: Negative for rash.  Allergic/Immunologic: Negative for immunocompromised state.  Neurological: Negative for dizziness and headaches.      Objective:    BP 111/71   Pulse 98   Temp 98.7 F (37.1 C) (Oral)   Wt 254 lb (115.2 kg)   BMI 34.93 kg/m  Nursing note and  vital signs reviewed.  Physical Exam Constitutional:      General: He is not in acute distress.    Appearance: He is well-developed.  Eyes:     Conjunctiva/sclera: Conjunctivae normal.  Cardiovascular:     Rate and Rhythm: Normal rate and regular rhythm.     Heart sounds: Normal heart sounds. No murmur heard.  No friction rub. No gallop.   Pulmonary:     Effort: Pulmonary effort is normal. No respiratory distress.     Breath sounds: Normal breath sounds. No wheezing or rales.  Chest:     Chest wall: No tenderness.  Abdominal:     General: Bowel sounds are normal.     Palpations: Abdomen is soft.     Tenderness: There is no abdominal tenderness.  Musculoskeletal:     Cervical back: Neck supple.  Lymphadenopathy:     Cervical: No cervical adenopathy.  Skin:    General: Skin is warm and dry.     Findings: No rash.  Neurological:     Mental Status: He is alert and oriented to person, place, and time.  Psychiatric:        Behavior: Behavior normal.        Thought Content: Thought content normal.        Judgment: Judgment normal.      Depression screen [facility] 2/9 08/25/2019 05/19/2019 02/09/2019 10/04/2018 03/24/2018  Decreased Interest 2 0 0 1 0  Down, Depressed, Hopeless 3 3 0 1 0  PHQ - 2 Score 5 3 0 2 0  Altered sleeping 1 3 - 0 0  Tired, decreased energy 2 3 - 1 0  Change in appetite 2 3 - 1 0  Feeling bad or failure about yourself  1 1 - 2 0  Trouble concentrating 0 1 - 0 0  Moving slowly or fidgety/restless  0 0 - 0 0  Suicidal thoughts 0 0 - 0 0  PHQ-9 Score 11 14 - 6 0  Difficult doing work/chores - - - - Not difficult at all  Some recent data might be hidden       Assessment & Plan:    Patient Active Problem List   Diagnosis Date Noted  . Severe major depression ([place]) 11/17/2018  . Class 2 obesity due to excess calories without serious comorbidity with body mass index (BMI) of 36.0 to 36.9 in adult 11/17/2018  . Diabetes ([place]) 11/09/2018  . Anticoagulated 08/24/2018  . Dyslipidemia 07/27/2018  . Essential hypertension 06/25/2018  . Hypogonadism in male 05/10/2018  . Numbness 03/31/2018  . Diabetic peripheral neuropathy ([place]) 01/25/2018  . History of syncope 01/25/2018  . S/P transmetatarsal amputation of foot, right ([place]) 10/09/2017  . Osteomyelitis ([place])   . Hypothyroidism   . Constipation   . History of DVT (deep vein thrombosis) 09/30/2017  . Morbid obesity due to excess calories ([place]) complicated by DM / hyperlipidemia 01/15/2017  . DOE (dyspnea on exertion) 01/13/2017  . Chronic migraine 09/17/2016  . Gait abnormality 09/17/2016  . Diabetic foot infection ([place]) 06/28/2016  . Fall 12/05/2015  . Toe ulcer, right ([place]) 09/19/2015  . Decreased pedal pulses 09/19/2015  . OSA on CPAP 09/05/2015  . Major depressive disorder, recurrent episode, moderate ([place]) 09/05/2015  . Low back pain 06/11/2015  . Abnormality of gait 06/11/2015  . Dizziness 03/17/2015  . Weakness 02/21/2015  . Chronic renal insufficiency, stage III (moderate) ([place]) 08/09/2014  . Insulin-requiring or dependent type II diabetes mellitus ([place]) 11/07/2013  . Hematuria 06/21/2013  .  Hepatic steatosis 09/09/2010  . Human immunodeficiency virus (HIV) disease ([place]) 06/04/2006  . HERPES ZOSTER, UNCOMPLICATED 17/47/1595  . Depression 06/04/2006  . THROMBOPHLEBITIS NOS 06/04/2006  . GERD 06/04/2006  . ARTHRITIS, HAND 06/04/2006     Problem List Items Addressed This Visit      Other   Human  immunodeficiency virus (HIV) disease ([place]) - Primary    Mr. Nieto continues to have well controlled HIV disease with good adherence and tolerance to his ART regimen of Triumeq. No signs/symptoms of opportunistic infection or progressive HIV disease. Reviewed previous lab work and discussed plan of care. Check blood work today. Continue current dose of Triumeq. Plan for follow up 6 months or sooner if needed.       Relevant Medications   abacavir-dolutegravir-lamiVUDine (TRIUMEQ) 600-50-300 MG tablet   Other Relevant Orders   COMPLETE METABOLIC PANEL WITH GFR   HIV-1 RNA quant-no reflex-bld   T-helper cell (CD4)- ([facility] clinic only)       I have changed [name]. [place]field's Triumeq. I am also having him maintain his zolpidem, ALPRAZolam, UNABLE TO FIND, Vortioxetine HBr (TRINTELLIX PO), levothyroxine, acetaminophen, protriptyline, amphetamine-dextroamphetamine, brexpiprazole, polyethylene glycol, Vitamin D3, mupirocin ointment, calcium-vitamin D, tiZANidine, NovoLOG FlexPen, canagliflozin, divalproex, rivaroxaban, pregabalin, pantoprazole, ezetimibe, furosemide, amLODipine, FreeStyle Libre 14 Day Sensor, Trulicity, Lantus SoloStar, diclofenac Sodium, ondansetron, and B-D ULTRAFINE III SHORT PEN.   Meds ordered this encounter  Medications  . abacavir-dolutegravir-lamiVUDine (TRIUMEQ) 600-50-300 MG tablet    Sig: Take 1 tablet by mouth daily.    Dispense:  30 tablet    Refill:  6    Order Specific Question:   Supervising Provider    Answer:   Carlyle Basques [[id number]]     Follow-up: Return in about 6 months (around 03/15/2020), or if symptoms worsen or fail to improve.   Terri Piedra, MSN, FNP-C Nurse Practitioner [facility] [place] number: [phone]

## 2019-09-13 NOTE — Patient Instructions (Signed)
Nice to meet you.  We will continue your current Triumeq.  Refills will be sent to the pharmacy.  We will check your blood work.   Renew [employer] for financial assistance.   Plan for follow up in 6 months or sooner if needed with lab work 1-2 weeks prior to appointment or on same day.

## 2019-09-13 NOTE — Assessment & Plan Note (Signed)
Mr. Sheridan continues to have well controlled HIV disease with good adherence and tolerance to his ART regimen of Triumeq. No signs/symptoms of opportunistic infection or progressive HIV disease. Reviewed previous lab work and discussed plan of care. Check blood work today. Continue current dose of Triumeq. Plan for follow up 6 months or sooner if needed.

## 2019-09-14 ENCOUNTER — Encounter (HOSPITAL_COMMUNITY): Payer: Self-pay

## 2019-09-14 ENCOUNTER — Emergency Department (HOSPITAL_COMMUNITY): Payer: PPO

## 2019-09-14 ENCOUNTER — Other Ambulatory Visit: Payer: Self-pay

## 2019-09-14 ENCOUNTER — Observation Stay (HOSPITAL_COMMUNITY)
Admission: EM | Admit: 2019-09-14 | Discharge: 2019-09-16 | Disposition: A | Discharge status: Home / Self Care | Payer: PPO | Attending: Internal Medicine | Admitting: Internal Medicine

## 2019-09-14 DIAGNOSIS — I517 Cardiomegaly: Secondary | ICD-10-CM | POA: Diagnosis not present

## 2019-09-14 DIAGNOSIS — R21 Rash and other nonspecific skin eruption: Secondary | ICD-10-CM | POA: Diagnosis not present

## 2019-09-14 DIAGNOSIS — G9341 Metabolic encephalopathy: Secondary | ICD-10-CM | POA: Diagnosis not present

## 2019-09-14 DIAGNOSIS — G4733 Obstructive sleep apnea (adult) (pediatric): Secondary | ICD-10-CM | POA: Insufficient documentation

## 2019-09-14 DIAGNOSIS — B20 Human immunodeficiency virus [HIV] disease: Secondary | ICD-10-CM | POA: Diagnosis not present

## 2019-09-14 DIAGNOSIS — I10 Essential (primary) hypertension: Secondary | ICD-10-CM | POA: Diagnosis not present

## 2019-09-14 DIAGNOSIS — G9340 Encephalopathy, unspecified: Secondary | ICD-10-CM

## 2019-09-14 DIAGNOSIS — Z20822 Contact with and (suspected) exposure to covid-19: Secondary | ICD-10-CM | POA: Insufficient documentation

## 2019-09-14 DIAGNOSIS — K219 Gastro-esophageal reflux disease without esophagitis: Secondary | ICD-10-CM | POA: Diagnosis not present

## 2019-09-14 DIAGNOSIS — E039 Hypothyroidism, unspecified: Secondary | ICD-10-CM | POA: Diagnosis not present

## 2019-09-14 DIAGNOSIS — F419 Anxiety disorder, unspecified: Secondary | ICD-10-CM | POA: Insufficient documentation

## 2019-09-14 DIAGNOSIS — R296 Repeated falls: Secondary | ICD-10-CM | POA: Diagnosis not present

## 2019-09-14 DIAGNOSIS — Z743 Need for continuous supervision: Secondary | ICD-10-CM | POA: Diagnosis not present

## 2019-09-14 DIAGNOSIS — I129 Hypertensive chronic kidney disease with stage 1 through stage 4 chronic kidney disease, or unspecified chronic kidney disease: Secondary | ICD-10-CM | POA: Insufficient documentation

## 2019-09-14 DIAGNOSIS — R531 Weakness: Principal | ICD-10-CM

## 2019-09-14 DIAGNOSIS — E1122 Type 2 diabetes mellitus with diabetic chronic kidney disease: Secondary | ICD-10-CM | POA: Diagnosis not present

## 2019-09-14 DIAGNOSIS — R404 Transient alteration of awareness: Secondary | ICD-10-CM | POA: Diagnosis not present

## 2019-09-14 DIAGNOSIS — F329 Major depressive disorder, single episode, unspecified: Secondary | ICD-10-CM | POA: Diagnosis not present

## 2019-09-14 DIAGNOSIS — N1831 Chronic kidney disease, stage 3a: Secondary | ICD-10-CM | POA: Diagnosis not present

## 2019-09-14 DIAGNOSIS — N390 Urinary tract infection, site not specified: Secondary | ICD-10-CM | POA: Insufficient documentation

## 2019-09-14 DIAGNOSIS — Z794 Long term (current) use of insulin: Secondary | ICD-10-CM | POA: Insufficient documentation

## 2019-09-14 DIAGNOSIS — R42 Dizziness and giddiness: Secondary | ICD-10-CM | POA: Diagnosis not present

## 2019-09-14 DIAGNOSIS — R4182 Altered mental status, unspecified: Secondary | ICD-10-CM | POA: Diagnosis not present

## 2019-09-14 DIAGNOSIS — R52 Pain, unspecified: Secondary | ICD-10-CM | POA: Diagnosis not present

## 2019-09-14 LAB — URINALYSIS, ROUTINE W REFLEX MICROSCOPIC
Bilirubin Urine: NEGATIVE
Glucose, UA: >=500 mg/dL — AB
Ketones, ur: NEGATIVE mg/dL
Nitrite: NEGATIVE
Protein, ur: 30 mg/dL — AB
Specific Gravity, Urine: 1.027 (ref 1.005–1.030)
pH: 5 (ref 5.0–8.0)

## 2019-09-14 LAB — CBC WITH DIFFERENTIAL/PLATELET
Abs Immature Granulocytes: 0.02 10*3/uL (ref 0.00–0.07)
Basophils Absolute: 0 10*3/uL (ref 0.0–0.1)
Basophils Relative: 0 %
Eosinophils Absolute: 0.1 10*3/uL (ref 0.0–0.5)
Eosinophils Relative: 2 %
HCT: 46.1 % (ref 39.0–52.0)
Hemoglobin: 16 g/dL (ref 13.0–17.0)
Immature Granulocytes: 0 %
Lymphocytes Relative: 33 %
Lymphs Abs: 2 10*3/uL (ref 0.7–4.0)
MCH: 32.9 pg (ref 26.0–34.0)
MCHC: 34.7 g/dL (ref 30.0–36.0)
MCV: 94.9 fL (ref 80.0–100.0)
Monocytes Absolute: 0.7 10*3/uL (ref 0.1–1.0)
Monocytes Relative: 12 %
Neutro Abs: 3.2 10*3/uL (ref 1.7–7.7)
Neutrophils Relative %: 53 %
Platelets: 130 10*3/uL — ABNORMAL LOW (ref 150–400)
RBC: 4.86 MIL/uL (ref 4.22–5.81)
RDW: 13.5 % (ref 11.5–15.5)
WBC: 6 10*3/uL (ref 4.0–10.5)
nRBC: 0 % (ref 0.0–0.2)

## 2019-09-14 LAB — COMPREHENSIVE METABOLIC PANEL
ALT: 25 U/L (ref 0–44)
AST: 24 U/L (ref 15–41)
Albumin: 3.7 g/dL (ref 3.5–5.0)
Alkaline Phosphatase: 85 U/L (ref 38–126)
Anion gap: 10 (ref 5–15)
BUN: 28 mg/dL — ABNORMAL HIGH (ref 8–23)
CO2: 26 mmol/L (ref 22–32)
Calcium: 9.2 mg/dL (ref 8.9–10.3)
Chloride: 102 mmol/L (ref 98–111)
Creatinine, Ser: 1.56 mg/dL — ABNORMAL HIGH (ref 0.61–1.24)
GFR calc Af Amer: 53 mL/min — ABNORMAL LOW (ref >60)
GFR calc non Af Amer: 46 mL/min — ABNORMAL LOW (ref >60)
Glucose, Bld: 188 mg/dL — ABNORMAL HIGH (ref 70–99)
Potassium: 3.6 mmol/L (ref 3.5–5.1)
Sodium: 138 mmol/L (ref 135–145)
Total Bilirubin: 0.7 mg/dL (ref 0.3–1.2)
Total Protein: 7.2 g/dL (ref 6.5–8.1)

## 2019-09-14 LAB — BLOOD GAS, ARTERIAL
Acid-Base Excess: 3.2 mmol/L — ABNORMAL HIGH (ref 0.0–2.0)
Bicarbonate: 26.2 mmol/L (ref 20.0–28.0)
FIO2: 21
O2 Saturation: 94.5 %
Patient temperature: 36.5
pCO2 arterial: 49.9 mmHg — ABNORMAL HIGH (ref 32.0–48.0)
pH, Arterial: 7.368 (ref 7.350–7.450)
pO2, Arterial: 70.3 mmHg — ABNORMAL LOW (ref 83.0–108.0)

## 2019-09-14 LAB — RAPID URINE DRUG SCREEN, HOSP PERFORMED
Amphetamines: POSITIVE — AB
Barbiturates: NOT DETECTED
Benzodiazepines: POSITIVE — AB
Cocaine: NOT DETECTED
Opiates: NOT DETECTED
Tetrahydrocannabinol: NOT DETECTED

## 2019-09-14 LAB — T-HELPER CELL (CD4) - (RCID CLINIC ONLY)
CD4 % Helper T Cell: 30 % — ABNORMAL LOW (ref 33–65)
CD4 T Cell Abs: 668 /uL (ref 400–1790)

## 2019-09-14 LAB — CBG MONITORING, ED: Glucose-Capillary: 167 mg/dL — ABNORMAL HIGH (ref 70–99)

## 2019-09-14 LAB — ETHANOL: Alcohol, Ethyl (B): <10 mg/dL (ref <10)

## 2019-09-14 LAB — VALPROIC ACID LEVEL: Valproic Acid Lvl: 20 ug/mL — ABNORMAL LOW (ref 50.0–100.0)

## 2019-09-14 LAB — BRAIN NATRIURETIC PEPTIDE: B Natriuretic Peptide: 67 pg/mL (ref 0.0–100.0)

## 2019-09-14 LAB — AMMONIA: Ammonia: 30 umol/L (ref 9–35)

## 2019-09-14 LAB — GLUCOSE, CAPILLARY: Glucose-Capillary: 133 mg/dL — ABNORMAL HIGH (ref 70–99)

## 2019-09-14 LAB — PROTIME-INR
INR: 1.4 — ABNORMAL HIGH (ref 0.8–1.2)
Prothrombin Time: 17 seconds — ABNORMAL HIGH (ref 11.4–15.2)

## 2019-09-14 LAB — CK: Total CK: 541 U/L — ABNORMAL HIGH (ref 49–397)

## 2019-09-14 LAB — TSH: TSH: 0.108 u[IU]/mL — ABNORMAL LOW (ref 0.350–4.500)

## 2019-09-14 LAB — SARS CORONAVIRUS 2 BY RT PCR (HOSPITAL ORDER, PERFORMED IN ~~LOC~~ HOSPITAL LAB): SARS Coronavirus 2: NEGATIVE

## 2019-09-14 LAB — TROPONIN I (HIGH SENSITIVITY): Troponin I (High Sensitivity): 4 ng/L (ref <18)

## 2019-09-14 MED ORDER — DOCUSATE SODIUM 100 MG PO CAPS
100.0000 mg | ORAL_CAPSULE | Freq: Two times a day (BID) | ORAL | Status: DC
  Administered 2019-09-14 – 2019-09-16 (×4): 100 mg via ORAL
  Filled 2019-09-14 (×4): qty 1

## 2019-09-14 MED ORDER — ACETAMINOPHEN 650 MG RE SUPP: 650.0000 mg | Freq: Four times a day (QID) | RECTAL | Status: DC | PRN

## 2019-09-14 MED ORDER — INSULIN GLARGINE 100 UNIT/ML ~~LOC~~ SOLN
130.0000 [IU] | Freq: Every day | SUBCUTANEOUS | Status: DC
  Administered 2019-09-15: 130 [IU] via SUBCUTANEOUS
  Filled 2019-09-14 (×5): qty 1.3

## 2019-09-14 MED ORDER — SODIUM CHLORIDE 0.9 % IV SOLN
1.0000 g | INTRAVENOUS | Status: DC
  Administered 2019-09-15: 1 g via INTRAVENOUS
  Filled 2019-09-14: qty 10

## 2019-09-14 MED ORDER — FUROSEMIDE 40 MG PO TABS
40.0000 mg | ORAL_TABLET | Freq: Every day | ORAL | Status: DC
  Administered 2019-09-14 – 2019-09-16 (×3): 40 mg via ORAL
  Filled 2019-09-14 (×3): qty 1

## 2019-09-14 MED ORDER — ACETAMINOPHEN 325 MG PO TABS
650.0000 mg | ORAL_TABLET | Freq: Four times a day (QID) | ORAL | Status: DC | PRN
  Administered 2019-09-14 – 2019-09-16 (×2): 650 mg via ORAL
  Filled 2019-09-14 (×2): qty 2

## 2019-09-14 MED ORDER — RIVAROXABAN 20 MG PO TABS
20.0000 mg | ORAL_TABLET | Freq: Every day | ORAL | Status: DC
  Administered 2019-09-14 – 2019-09-15 (×2): 20 mg via ORAL
  Filled 2019-09-14 (×2): qty 1

## 2019-09-14 MED ORDER — DIVALPROEX SODIUM ER 500 MG PO TB24
500.0000 mg | ORAL_TABLET | Freq: Every day | ORAL | Status: DC
  Administered 2019-09-14 – 2019-09-15 (×2): 500 mg via ORAL
  Filled 2019-09-14 (×2): qty 1

## 2019-09-14 MED ORDER — VORTIOXETINE HBR 20 MG PO TABS
20.0000 mg | ORAL_TABLET | Freq: Every day | ORAL | Status: DC
  Administered 2019-09-14 – 2019-09-15 (×2): 20 mg via ORAL
  Filled 2019-09-14 (×5): qty 1

## 2019-09-14 MED ORDER — ABACAVIR-DOLUTEGRAVIR-LAMIVUD 600-50-300 MG PO TABS
1.0000 | ORAL_TABLET | Freq: Every day | ORAL | Status: DC
  Administered 2019-09-14 – 2019-09-15 (×2): 1 via ORAL
  Filled 2019-09-14 (×6): qty 1

## 2019-09-14 MED ORDER — EZETIMIBE 10 MG PO TABS
10.0000 mg | ORAL_TABLET | Freq: Every day | ORAL | Status: DC
  Administered 2019-09-14 – 2019-09-15 (×2): 10 mg via ORAL
  Filled 2019-09-14 (×2): qty 1

## 2019-09-14 MED ORDER — ALPRAZOLAM 0.5 MG PO TABS
0.5000 mg | ORAL_TABLET | Freq: Two times a day (BID) | ORAL | Status: DC | PRN
  Administered 2019-09-14: 0.5 mg via ORAL
  Filled 2019-09-14: qty 1

## 2019-09-14 MED ORDER — PREGABALIN 50 MG PO CAPS
100.0000 mg | ORAL_CAPSULE | Freq: Two times a day (BID) | ORAL | Status: DC
  Administered 2019-09-14 – 2019-09-16 (×4): 100 mg via ORAL
  Filled 2019-09-14 (×4): qty 2

## 2019-09-14 MED ORDER — INSULIN ASPART 100 UNIT/ML ~~LOC~~ SOLN: 0.0000 [IU] | Freq: Every day | SUBCUTANEOUS | Status: DC

## 2019-09-14 MED ORDER — ONDANSETRON HCL 4 MG/2ML IJ SOLN: 4.0000 mg | Freq: Four times a day (QID) | INTRAMUSCULAR | Status: DC | PRN

## 2019-09-14 MED ORDER — BREXPIPRAZOLE 1 MG PO TABS
1.0000 mg | ORAL_TABLET | Freq: Every day | ORAL | Status: DC
  Administered 2019-09-14 – 2019-09-15 (×2): 1 mg via ORAL
  Filled 2019-09-14 (×5): qty 1

## 2019-09-14 MED ORDER — SODIUM CHLORIDE 0.9 % IV SOLN
1.0000 g | Freq: Once | INTRAVENOUS | Status: AC
  Administered 2019-09-14: 1 g via INTRAVENOUS
  Filled 2019-09-14: qty 10

## 2019-09-14 MED ORDER — LEVOTHYROXINE SODIUM 25 MCG PO TABS
25.0000 ug | ORAL_TABLET | Freq: Every day | ORAL | Status: DC
  Administered 2019-09-14 – 2019-09-15 (×2): 25 ug via ORAL
  Filled 2019-09-14 (×2): qty 1

## 2019-09-14 MED ORDER — INSULIN ASPART 100 UNIT/ML ~~LOC~~ SOLN
15.0000 [IU] | Freq: Three times a day (TID) | SUBCUTANEOUS | Status: DC
  Administered 2019-09-15 (×2): 15 [IU] via SUBCUTANEOUS

## 2019-09-14 MED ORDER — ZOLPIDEM TARTRATE 5 MG PO TABS
5.0000 mg | ORAL_TABLET | Freq: Every evening | ORAL | Status: DC | PRN
  Administered 2019-09-14: 5 mg via ORAL
  Filled 2019-09-14: qty 1

## 2019-09-14 MED ORDER — ONDANSETRON HCL 4 MG PO TABS: 4.0000 mg | ORAL_TABLET | Freq: Four times a day (QID) | ORAL | Status: DC | PRN

## 2019-09-14 MED ORDER — BREXPIPRAZOLE 0.25 MG PO TABS: 0.5000 mg | ORAL_TABLET | Freq: Every day | ORAL | Status: DC

## 2019-09-14 MED ORDER — PANTOPRAZOLE SODIUM 40 MG PO TBEC
40.0000 mg | DELAYED_RELEASE_TABLET | Freq: Every day | ORAL | Status: DC
  Filled 2019-09-14 (×2): qty 1

## 2019-09-14 NOTE — ED Triage Notes (Addendum)
Pt reports generalized weakness  And dizziness that started last night.  Has fallen several times today.  EMS says pt has caregiver at home.  Denies any n/v/d.  Pt presently alert and oriented, c/o "catch" in his neck.  When caregiver arrived this morning, pt was laying in the kitchen floor.  Says was unable to get pt up.  Caregiver called ems this afternoon.  CBG 185,  Hr 80, Sinus on ekg per ems.  Denies hitting his head.  EMS says pt was dragging his r foot when he walked and caregiver told ems that was not new but was more pronounced today than usual.  Pt snoring during triage.

## 2019-09-14 NOTE — ED Notes (Signed)
Bag lunch requested

## 2019-09-14 NOTE — ED Notes (Signed)
Pt made aware urine sample is needed.

## 2019-09-14 NOTE — ED Notes (Signed)
Patient transported to CT 

## 2019-09-14 NOTE — ED Provider Notes (Signed)
[facility] EMERGENCY DEPARTMENT Provider Note   CSN: [id number] Arrival date & time: 09/14/19  1444     History Chief Complaint  Patient presents with  . Fall    Daniel [name] is a 64 y.o. male.  HPI   This patient is a 64 year old male, he is coming from home, he has a known history of HIV, essential hypertension, diabetes, "clotting disorder" and chronic kidney disease.  He has severe major depression and is very obese.  According to the notes the patient has had a transmetatarsal amputation of his right foot.  He presents to the hospital with weakness after having multiple falls and dizzy spells.  The patient is somnolent to obtunded but able to wake up, he mumbles with slurred speech that sometimes becomes clear that every time he stands up he gets dizzy and falls and was too weak to stand by himself.  He was unable to get up off the ground by tech at home.  Level 5 caveat applies due to AMS.  Past Medical History:  Diagnosis Date  . ADHD (attention deficit hyperactivity disorder)   . Anxiety   . Chronic kidney disease   . Clotting disorder ([place])   . Depression   . Diabetes mellitus without complication ([place])   . Diabetes mellitus, type II ([place])   . Dizziness 03/17/2015  . Essential hypertension 06/25/2018  . GERD (gastroesophageal reflux disease)   . HIV disease ([place])   . HIV infection ([place])   . Liver disease   . OSA (obstructive sleep apnea) 07/25/2015   Uses CPAP regularly  . Peripheral vascular disease ([place])   . Ulcer     Patient Active Problem List   Diagnosis Date Noted  . Severe major depression ([place]) 11/17/2018  . Class 2 obesity due to excess calories without serious comorbidity with body mass index (BMI) of 36.0 to 36.9 in adult 11/17/2018  . Diabetes ([place]) 11/09/2018  . Anticoagulated 08/24/2018  . Dyslipidemia 07/27/2018  . Essential hypertension 06/25/2018  . Hypogonadism in male 05/10/2018  . Numbness 03/31/2018  . Diabetic peripheral neuropathy  ([place]) 01/25/2018  . History of syncope 01/25/2018  . S/P transmetatarsal amputation of foot, right ([place]) 10/09/2017  . Osteomyelitis ([place])   . Hypothyroidism   . Constipation   . History of DVT (deep vein thrombosis) 09/30/2017  . Morbid obesity due to excess calories ([place]) complicated by DM / hyperlipidemia 01/15/2017  . DOE (dyspnea on exertion) 01/13/2017  . Chronic migraine 09/17/2016  . Gait abnormality 09/17/2016  . Diabetic foot infection ([place]) 06/28/2016  . Fall 12/05/2015  . Toe ulcer, right ([place]) 09/19/2015  . Decreased pedal pulses 09/19/2015  . OSA on CPAP 09/05/2015  . Major depressive disorder, recurrent episode, moderate ([place]) 09/05/2015  . Low back pain 06/11/2015  . Abnormality of gait 06/11/2015  . Dizziness 03/17/2015  . Weakness 02/21/2015  . Chronic renal insufficiency, stage III (moderate) ([place]) 08/09/2014  . Insulin-requiring or dependent type II diabetes mellitus ([place]) 11/07/2013  . Hematuria 06/21/2013  . Hepatic steatosis 09/09/2010  . Human immunodeficiency virus (HIV) disease ([place]) 06/04/2006  . HERPES ZOSTER, UNCOMPLICATED 47/65/4650  . Depression 06/04/2006  . THROMBOPHLEBITIS NOS 06/04/2006  . GERD 06/04/2006  . ARTHRITIS, HAND 06/04/2006    Past Surgical History:  Procedure Laterality Date  . AMPUTATION Right 10/02/2017   Procedure: RIGHT TRANSMETATARSAL AMPUTATION;  Surgeon: Leandrew Koyanagi, MD;  Location: [facility];  Service: Orthopedics;  Laterality: Right;  . SMALL INTESTINE SURGERY    .  STOMACH SURGERY    . TOE AMPUTATION Right 08/2016   right great toe       Family History  Problem Relation Age of Onset  . Depression Brother   . Throat cancer Brother        half brother, never smoker  . COPD Mother   . Diabetes Neg Hx     Social History   Tobacco Use  . Smoking status: Former Smoker    Packs/day: 0.10    Years: 10.00    Pack years: 1.00    Types: Cigars    Quit date: 08/09/2014    Years since quitting: 5.1  . Smokeless  tobacco: Never Used  Vaping Use  . Vaping Use: Never used  Substance Use Topics  . Alcohol use: No    Alcohol/week: 0.0 standard drinks  . Drug use: No    Home Medications Prior to Admission medications   Medication Sig Start Date End Date Taking? Authorizing Provider  abacavir-dolutegravir-lamiVUDine (TRIUMEQ) 600-50-300 MG tablet Take 1 tablet by mouth daily. 09/13/19   Golden Circle, FNP  acetaminophen (TYLENOL) 500 MG tablet Take 1,000-1,500 mg by mouth every 8 (eight) hours as needed for moderate pain.     [provider]  ALPRAZolam Duanne Moron) 1 MG tablet Take 1 mg by mouth 4 (four) times daily as needed for anxiety.     [provider]  amLODipine (NORVASC) 5 MG tablet Take 1 tablet (5 mg total) by mouth daily. 05/19/19   [name], MD  amphetamine-dextroamphetamine (ADDERALL) 30 MG tablet Take 30 mg by mouth 3 (three) times daily.    [provider]  Brexpiprazole (REXULTI) 1 MG TABS Take 1 mg by mouth at bedtime.    [provider]  calcium-vitamin D (OSCAL WITH D) 500-200 MG-UNIT tablet Take 1 tablet by mouth.    [provider]  canagliflozin (INVOKANA) 100 MG TABS tablet Take 1 tablet (100 mg total) by mouth daily before breakfast. 02/01/19   Renato Shin, MD  Cholecalciferol (VITAMIN D3) 250 MCG (10000 UT) TABS Take by mouth.    [provider]  Continuous Blood Gluc Sensor (FREESTYLE LIBRE 14 DAY SENSOR) MISC USE AS DIRECTED EVERY 14 DAYS 06/06/19   Renato Shin, MD  diclofenac Sodium (VOLTAREN) 1 % GEL APPLY 2 GRAMS TOPICALLY TO EACH KNEE IN THE MORNING AND AT BEDTIME, APPLY 1 GRAM TO EACH KNEE IN THE AFTERNOON 08/25/19   [name], MD  divalproex (DEPAKOTE ER) 500 MG 24 hr tablet TAKE 1 TABLET(500 MG) BY MOUTH AT BEDTIME 05/06/19   [name], MD  Dulaglutide (TRULICITY) 3 HK/7.4QV SOPN Inject 0.5 mLs (3 mg total) into the skin once a week. 08/24/19   Renato Shin, MD  ezetimibe (ZETIA) 10 MG tablet TAKE  1 TABLET(10 MG) BY MOUTH DAILY 05/19/19   [name], MD  furosemide (LASIX) 40 MG tablet TAKE 1 TABLET(40 MG) BY MOUTH DAILY 05/19/19   [name], MD  insulin aspart (NOVOLOG FLEXPEN) 100 UNIT/ML FlexPen 15 units with breakfast, and 25 unit with supper 11/08/18   Renato Shin, MD  insulin glargine (LANTUS SOLOSTAR) 100 UNIT/ML Solostar Pen Inject 130 Units into the skin every morning. 08/24/19   Renato Shin, MD  Insulin Pen Needle (B-D ULTRAFINE III SHORT PEN) 31G X 8 MM MISC 1 each by Other route 3 (three) times daily. E11.9 08/30/19   Renato Shin, MD  levothyroxine (SYNTHROID, LEVOTHROID) 50 MCG tablet Take 1 tablet (50 mcg total)  by mouth at bedtime. 07/24/15   [name], MD  mupirocin ointment (BACTROBAN) 2 % Place 1 application into the nose 2 (two) times daily. 06/10/18   Leandrew Koyanagi, MD  ondansetron (ZOFRAN) 8 MG tablet TAKE 1 TABLET(8 MG) BY MOUTH EVERY 8 HOURS AS NEEDED FOR NAUSEA OR VOMITING 08/30/19   Comer, Okey Regal, MD  pantoprazole (PROTONIX) 40 MG tablet TAKE 1 TABLET(40 MG) BY MOUTH DAILY AT 6 AM 05/19/19   [name], MD  polyethylene glycol [facility] / Floria Raveling) packet Take 17 g by mouth 2 (two) times daily. 10/05/17   Eugenie Filler, MD  pregabalin (LYRICA) 150 MG capsule Take 2 capsules (300 mg total) by mouth 2 (two) times daily. 05/19/19   [name], MD  protriptyline (VIVACTIL) 10 MG tablet Take 10 mg by mouth 3 (three) times daily.  01/30/16   [provider]  rivaroxaban (XARELTO) 20 MG TABS tablet TAKE 1 TABLET(20 MG) BY MOUTH DAILY 05/19/19   [name], MD  tiZANidine (ZANAFLEX) 4 MG tablet TAKE 1 TABLET(4 MG) BY MOUTH EVERY 6 HOURS AS NEEDED FOR MUSCLE SPASMS. DO NOT REFILL IN LESS THAN 30 DAYS 09/15/18   Marcial Pacas, MD  UNABLE TO FIND CPAP MACHINE with standard Aclaim nasal mask with humidifier. Set at 14 cwp Patient taking differently: CPAP MACHINE with standard Aclaim nasal mask with humidifier. Set at 4 cwp 04/12/13   [name], MD  Vortioxetine HBr (TRINTELLIX PO) Take 25 mg by mouth at bedtime.  01/26/15   [provider]  zolpidem (AMBIEN) 10 MG tablet Take 10 mg by mouth at bedtime.     [provider]    Allergies    Aspirin, Ibuprofen, Sustiva [efavirenz], Nsaids, and Lipitor [atorvastatin calcium]  Review of Systems   Review of Systems  Unable to perform ROS: Mental status change    Physical Exam Updated Vital Signs BP 119/73   Pulse 74   Temp 97.7 F (36.5 C)   Resp 15   Ht 1.803 m (5' 11" )   Wt 115 kg   SpO2 96%   BMI 35.36 kg/m   Physical Exam Vitals and nursing note reviewed.  Constitutional:      Appearance: He is well-developed. He is ill-appearing.     Comments: Obese, somnolent to obtunded  HENT:     Head: Normocephalic and atraumatic.     Mouth/Throat:     Mouth: Mucous membranes are dry.     Pharynx: No oropharyngeal exudate.  Eyes:     General: No scleral icterus.       Right eye: No discharge.        Left eye: No discharge.     Conjunctiva/sclera: Conjunctivae normal.     Pupils: Pupils are equal, round, and reactive to light.  Neck:     Thyroid: No thyromegaly.     Vascular: No JVD.  Cardiovascular:     Rate and Rhythm: Normal rate and regular rhythm.     Heart sounds: Normal heart sounds. No murmur heard.  No friction rub. No gallop.   Pulmonary:     Effort: Pulmonary effort is normal. No respiratory distress.     Breath sounds: Normal breath sounds. No wheezing or rales.  Abdominal:     General: Bowel sounds are normal. There is no distension.     Palpations: Abdomen is soft. There is no mass.     Tenderness: There is no abdominal tenderness.  Musculoskeletal:  General: No tenderness. Normal range of motion.     Cervical back: Normal range of motion and neck supple.     Right lower leg: Edema present.     Left lower leg: Edema present.     Comments: Transmetatarsal amputation of the right foot noted, no drainage or discharge    Lymphadenopathy:     Cervical: No cervical adenopathy.  Skin:    General: Skin is warm and dry.     Findings: No erythema or rash.  Neurological:     Coordination: Coordination normal.     Comments: The patient is obtunded but able to arise to loud voice or painful stimuli.  When strongly encouraged the patient is able to grip the bed rails and help to sit up in the bed but is extremely terribly weak and falls asleep almost immediately  Psychiatric:        Behavior: Behavior normal.     ED Results / Procedures / Treatments   Labs (all labs ordered are listed, but only abnormal results are displayed) Labs Reviewed  CBC WITH DIFFERENTIAL/PLATELET - Abnormal; Notable for the following components:      Result Value   Platelets 130 (*)    All other components within normal limits  COMPREHENSIVE METABOLIC PANEL - Abnormal; Notable for the following components:   Glucose, Bld 188 (*)    BUN 28 (*)    Creatinine, Ser 1.56 (*)    GFR calc non Af Amer 46 (*)    GFR calc Af Amer 53 (*)    All other components within normal limits  PROTIME-INR - Abnormal; Notable for the following components:   Prothrombin Time 17.0 (*)    INR 1.4 (*)    All other components within normal limits  RAPID URINE DRUG SCREEN, HOSP PERFORMED - Abnormal; Notable for the following components:   Benzodiazepines POSITIVE (*)    Amphetamines POSITIVE (*)    All other components within normal limits  URINALYSIS, ROUTINE W REFLEX MICROSCOPIC - Abnormal; Notable for the following components:   APPearance HAZY (*)    Glucose, UA >=500 (*)    Hgb urine dipstick MODERATE (*)    Protein, ur 30 (*)    Leukocytes,Ua SMALL (*)    Bacteria, UA FEW (*)    All other components within normal limits  BLOOD GAS, ARTERIAL - Abnormal; Notable for the following components:   pCO2 arterial 49.9 (*)    pO2, Arterial 70.3 (*)    Acid-Base Excess 3.2 (*)    All other components within normal limits  VALPROIC ACID LEVEL -  Abnormal; Notable for the following components:   Valproic Acid Lvl 20 (*)    All other components within normal limits  TSH - Abnormal; Notable for the following components:   TSH 0.108 (*)    All other components within normal limits  CBG MONITORING, ED - Abnormal; Notable for the following components:   Glucose-Capillary 167 (*)    All other components within normal limits  URINE CULTURE  ETHANOL  AMMONIA  CK  TROPONIN I (HIGH SENSITIVITY)    EKG EKG Interpretation  Date/Time:  Wednesday September 14 2019 15:01:08 EDT Ventricular Rate:  80 PR Interval:    QRS Duration: 109 QT Interval:  380 QTC Calculation: 439 R Axis:   78 Text Interpretation: Sinus rhythm Atrial premature complex Confirmed by Noemi Chapel [phone]) on 09/14/2019 3:31:39 PM   Radiology CT Head Wo Contrast  Result Date: 09/14/2019 CLINICAL DATA:  Mental status  changes, unknown cause. Generalized weakness and dizziness. Several falls today. EXAM: CT HEAD WITHOUT CONTRAST TECHNIQUE: Contiguous axial images were obtained from the base of the skull through the vertex without intravenous contrast. COMPARISON:  MRI 09/24/2017 and report of CT of the head on 09/28/2017, CT of the head on 06/28/2016 FINDINGS: Brain: No evidence of acute infarction, hemorrhage, hydrocephalus, extra-axial collection or mass lesion/mass effect. Vascular: No hyperdense vessel or unexpected calcification. Skull: Normal. Negative for fracture or focal lesion. Sinuses/Orbits: No acute finding. Other: None. IMPRESSION: No evidence for acute intracranial abnormality. Electronically Signed   By: Nolon Nations M.D.   On: 09/14/2019 17:53   DG Chest Port 1 View  Result Date: 09/14/2019 CLINICAL DATA:  64 year old male with altered mental status. EXAM: PORTABLE CHEST 1 VIEW COMPARISON:  Chest radiograph dated 10/16/2017. FINDINGS: There is cardiomegaly with mild vascular congestion. No focal consolidation, pleural effusion, pneumothorax. No acute  osseous pathology. IMPRESSION: Cardiomegaly with mild vascular congestion. No focal consolidation. Electronically Signed   By: Anner Crete M.D.   On: 09/14/2019 16:06    Procedures Procedures (including critical care time)  Medications Ordered in ED Medications  cefTRIAXone (ROCEPHIN) 1 g in sodium chloride 0.9 % 100 mL IVPB (has no administration in time range)    ED Course  I have reviewed the triage vital signs and the nursing notes.  Pertinent labs & imaging results that were available during my care of the patient were reviewed by me and considered in my medical decision making (see chart for details).    MDM Rules/Calculators/A&P                          This patient does appear critically ill, his mental status is such that he will need constant neurologic and cardiac monitoring.  The exact cause of the patient's symptoms is unclear but would consider hypercapnia, hyperglycemia or DKA, would also consider infection, stroke, myocardial infarction.  The differential diagnosis is quite large and will require an mandated multiple test.  It is unclear whether this patient is weak and falling because of misuse of benzodiazepines given his mental status, his drug screen was positive.  Or whether this was something more serious.  He does have signs of urinary tract infection possibly.  At this time the patient will need to be admitted to the hospital.  He is not hypercapnic, he is not hyperglycemic to any significant degree, he does not have an acidosis and his white blood cell count is normal.  Will discuss with the hospitalist for admission hydration given, treatment for possible UTI given in urine culture  D/w Hospitatalist who has been kind enough to see patient for admission.  Pt is still somnolent - has not improved to any significant degree - no apnea and no hypotension - not requiring O2.  Final Clinical Impression(s) / ED Diagnoses Final diagnoses:  Acute encephalopathy    Urinary tract infection without hematuria, site unspecified    Rx / DC Orders ED Discharge Orders    None       Noemi Chapel, MD 09/14/19 1945

## 2019-09-14 NOTE — H&P (Addendum)
TRH H&P    Patient Demographics:    Daniel [name], is a 64 y.o. male  MRN: [id number]  DOB - May 27, 1955  Admit Date - 09/14/2019  Referring MD/NP/PA: Dr. Sabra Heck   Outpatient Primary MD for the patient is [name], MD  Patient coming from: Home  Chief complaint- Multiple Falls   HPI:    Daniel [name]  is a 64 y.o. male, with history of peripheral vascular disease, sleep apnea, liver disease, HIV, GERD, hypertension, diabetes mellitus type 2, depression, anxiety, history of DVTs and PE, chronic kidney disease, and ADHD presents to the hospital with a chief complaint of multiple falls.  Patient reports that he has had 5-6 falls over the last 24 hours.  He reports that he does not trip and fall, does not have chest pain prior to fall, does not have palpitations, does not have shortness of breath, does not have headache, does not feel lightheaded.  He reports it is just "boom" and he is on the ground.  He has not lost consciousness.  He has not hit his head.  When asked if his legs just give out, patient reports "it's something like that."  No provocative factors, nothing is made it better.  Patient reports that with the first several falls he was able to get himself up off the floor.  With his last fall he was down on the ground for an unknown period of time.  Patient's sense of time is not adequate for revealing how long he was down, as he reports that he fell at 1 PM and was picked up by EMS at 12:45 PM.  In any event, his aunt's home health aide arrived to find him on the floor and was not able to get him up she called EMS.  Patient reports no associated symptoms.  He reports that he has had episode like this before approximately 2 years ago.  Chart review reveals that he was in the ER in August 2019 for generalized weakness.  At that time he was having trouble walking on his right foot after a transmetatarsal  amputation secondary to osteomyelitis.  Patient was also somnolent but arousable and followed commands without difficulty.  Patient was able to stand on his own at that visit and was ultimately discharged home.  Chart review also reveals an office visit for right-sided sciatic pain earlier this month.  He was referred to Ortho and it does not appear that that appointment has been completed yet.  In the ER: Temp 98.7, blood pressure 140/76, O2 sat 98%, heart rate 81, respiratory rate 19 No leukocytosis, no anemia Electrolytes are stable Creatinine is at baseline Hyperglycemic at 188 TSH is low at 0.108 -levothyroxine reduced at admission to 25 mcg Valproic acid level is low at 20 -patient reports that he is compliant with his medication Ammonia is normal at 30 CPK is 541 Troponin is 4 Alcohol level is not detectable UA is positive for UTI -21-50 white blood cells small leukocytes patient admits to occasional dysuria Urine drug screen is positive  for amphetamines and benzos -patient is prescribed both CT head shows no evidence of acute intracranial abnormality Chest x-ray shows cardiomegaly with mild vascular congestion no consolidation, effusion, or pneumothorax Admission requested for acute metabolic encephalopathy, generalized weakness, multiple falls      Review of systems:    In addition to the HPI above,  No Fever-chills, No Headache, No changes with Vision or acute change in hearing - patient does wear hearing aides at home, No sinus pain or congestion No problems swallowing food or Liquids, No Chest pain, Cough or Shortness of Breath, + for occasional swelling in legs No Abdominal pain, No Nausea or Vomiting, bowel movements are regular, No Blood in stool or Urine, Occasional dysuria, No new skin rashes or bruises, No new joints pains-aches,  No new weakness, numbness in any extremity, No recent weight gain or loss, No polyuria, polydypsia or polyphagia, Patient does  admit to "nervousness" at home, denies symptoms of depression  All other systems reviewed and are negative.     Past History of the following :    Past Medical History:  Diagnosis Date  . ADHD (attention deficit hyperactivity disorder)   . Anxiety   . Chronic kidney disease   . Clotting disorder ([place])   . Depression   . Diabetes mellitus without complication ([place])   . Diabetes mellitus, type II ([place])   . Dizziness 03/17/2015  . Essential hypertension 06/25/2018  . GERD (gastroesophageal reflux disease)   . HIV disease ([place])   . HIV infection ([place])   . Liver disease   . OSA (obstructive sleep apnea) 07/25/2015   Uses CPAP regularly  . Peripheral vascular disease ([place])   . Ulcer       Past Surgical History:  Procedure Laterality Date  . AMPUTATION Right 10/02/2017   Procedure: RIGHT TRANSMETATARSAL AMPUTATION;  Surgeon: Leandrew Koyanagi, MD;  Location: [facility];  Service: Orthopedics;  Laterality: Right;  . SMALL INTESTINE SURGERY    . STOMACH SURGERY    . TOE AMPUTATION Right 08/2016   right great toe      Social History:      Social History   Tobacco Use  . Smoking status: Former Smoker    Packs/day: 0.10    Years: 10.00    Pack years: 1.00    Types: Cigars    Quit date: 08/09/2014    Years since quitting: 5.1  . Smokeless tobacco: Never Used  Substance Use Topics  . Alcohol use: No    Alcohol/week: 0.0 standard drinks       Family History :     Family History  Problem Relation Age of Onset  . Depression Brother   . Throat cancer Brother        half brother, never smoker  . COPD Mother   . Diabetes Neg Hx    Reviewed   Home Medications:   Prior to Admission medications   Medication Sig Start Date End Date Taking? Authorizing Provider  abacavir-dolutegravir-lamiVUDine (TRIUMEQ) 600-50-300 MG tablet Take 1 tablet by mouth daily. Patient taking differently: Take 1 tablet by mouth at bedtime.  09/13/19  Yes Golden Circle, FNP  acetaminophen (TYLENOL)  500 MG tablet Take 1,500 mg by mouth in the morning and at bedtime.    Yes [provider]  ALPRAZolam Duanne Moron) 1 MG tablet Take 1 mg by mouth at bedtime. *May take one tablet up to 4 times daily as needed for anxiety   Yes [provider]  amLODipine (  NORVASC) 5 MG tablet Take 1 tablet (5 mg total) by mouth daily. 05/19/19  Yes [name], MD  amphetamine-dextroamphetamine (ADDERALL) 30 MG tablet Take 30 mg by mouth 3 (three) times daily.   Yes [provider]  Brexpiprazole (REXULTI) 1 MG TABS Take 1 mg by mouth at bedtime.   Yes [provider]  canagliflozin (INVOKANA) 100 MG TABS tablet Take 1 tablet (100 mg total) by mouth daily before breakfast. 02/01/19  Yes Renato Shin, MD  diclofenac Sodium (VOLTAREN) 1 % GEL APPLY 2 GRAMS TOPICALLY TO EACH KNEE IN THE MORNING AND AT BEDTIME, APPLY 1 GRAM TO [place] AFTERNOON Patient taking differently: Apply 1-2 g topically See admin instructions. APPLY 2 GRAMS TOPICALLY TO EACH KNEE IN THE MORNING AND AT BEDTIME, APPLY 1 GRAM TO EACH KNEE IN THE AFTERNOON 08/25/19  Yes [name], MD  divalproex (DEPAKOTE ER) 500 MG 24 hr tablet TAKE 1 TABLET(500 MG) BY MOUTH AT BEDTIME Patient taking differently: Take 500 mg by mouth at bedtime.  05/06/19  Yes [name], MD  Dulaglutide (TRULICITY) 3 VZ/4.8OL SOPN Inject 0.5 mLs (3 mg total) into the skin once a week. Patient taking differently: Inject 3 mg into the skin every Saturday.  08/24/19  Yes Renato Shin, MD  ezetimibe (ZETIA) 10 MG tablet TAKE 1 TABLET(10 MG) BY MOUTH DAILY Patient taking differently: Take 10 mg by mouth at bedtime.  05/19/19  Yes [name], MD  furosemide (LASIX) 40 MG tablet TAKE 1 TABLET(40 MG) BY MOUTH DAILY Patient taking differently: Take 40 mg by mouth daily.  05/19/19  Yes [name], MD  insulin aspart (NOVOLOG FLEXPEN) 100 UNIT/ML FlexPen 15 units with breakfast, and 25 unit with supper Patient taking  differently: Inject 15-25 Units into the skin See admin instructions. 15 units with breakfast, and 25 unit with supper 11/08/18  Yes Renato Shin, MD  insulin glargine (LANTUS SOLOSTAR) 100 UNIT/ML Solostar Pen Inject 130 Units into the skin every morning. Patient taking differently: Inject 150 Units into the skin every morning.  08/24/19  Yes Renato Shin, MD  levothyroxine (SYNTHROID, LEVOTHROID) 50 MCG tablet Take 1 tablet (50 mcg total) by mouth at bedtime. 07/24/15  Yes Daub, Loura Back, MD  ondansetron (ZOFRAN) 8 MG tablet TAKE 1 TABLET(8 MG) BY MOUTH EVERY 8 HOURS AS NEEDED FOR NAUSEA OR VOMITING Patient taking differently: Take 8 mg by mouth every 8 (eight) hours as needed for nausea or vomiting.  08/30/19  Yes Comer, Okey Regal, MD  pantoprazole (PROTONIX) 40 MG tablet TAKE 1 TABLET(40 MG) BY MOUTH DAILY AT 6 AM Patient taking differently: Take 40 mg by mouth every evening.  05/19/19  Yes [name], MD  polyethylene glycol (MIRALAX / GLYCOLAX) packet Take 17 g by mouth 2 (two) times daily. Patient taking differently: Take 17 g by mouth 2 (two) times daily as needed for mild constipation or moderate constipation.  10/05/17  Yes Eugenie Filler, MD  pregabalin (LYRICA) 150 MG capsule Take 2 capsules (300 mg total) by mouth 2 (two) times daily. 05/19/19  Yes [name], MD  protriptyline (VIVACTIL) 10 MG tablet Take 10 mg by mouth 3 (three) times daily.  01/30/16  Yes [provider]  rivaroxaban (XARELTO) 20 MG TABS tablet TAKE 1 TABLET(20 MG) BY MOUTH DAILY Patient taking differently: Take 20 mg by mouth daily with supper.  05/19/19  Yes [name], MD  tiZANidine (ZANAFLEX) 4 MG tablet TAKE  1 TABLET(4 MG) BY MOUTH EVERY 6 HOURS AS NEEDED FOR MUSCLE SPASMS. DO NOT REFILL IN LESS THAN 30 DAYS Patient taking differently: Take 4 mg by mouth every 6 (six) hours as needed for muscle spasms.  09/15/18  Yes Marcial Pacas, MD  TRINTELLIX 20 MG TABS tablet Take 20 mg by mouth at  bedtime.  01/26/15  Yes [provider]  TRULICITY 5.05 LZ/7.6BH SOPN Inject 0.75 mg into the skin every Saturday. 09/01/19  Yes [provider]  vortioxetine HBr (TRINTELLIX) 5 MG TABS tablet Take 5 mg by mouth at bedtime.   Yes [provider]  zolpidem (AMBIEN) 10 MG tablet Take 10 mg by mouth at bedtime.    Yes [provider]  Continuous Blood Gluc Sensor (FREESTYLE LIBRE 14 DAY SENSOR) MISC USE AS DIRECTED EVERY 14 DAYS Patient not taking: Reported on 09/14/2019 06/06/19   Renato Shin, MD  Insulin Pen Needle (B-D ULTRAFINE III SHORT PEN) 31G X 8 MM MISC 1 each by Other route 3 (three) times daily. E11.9 Patient not taking: Reported on 09/14/2019 08/30/19   Renato Shin, MD     Allergies:     Allergies  Allergen Reactions  . Aspirin Swelling  . Ibuprofen Swelling  . Sustiva [Efavirenz] Swelling and Rash  . Nsaids Other (See Comments)    unknwn  . Lipitor [Atorvastatin Calcium] Other (See Comments)    Leg pain     Physical Exam:   Vitals  Blood pressure 110/67, pulse 75, temperature 97.7 F (36.5 C), resp. rate 14, height 5' 11"  (1.803 m), weight 115 kg, SpO2 98 %.  1.  General: Lying supine in bed with head of bed elevated asleep upon entry to room  2. Psychiatric: Patient is irritable, somnolent  3. Neurologic: Patient is oriented x3, arousable to voice, moves all 4 extremities voluntarily, cranial nerves II through XII are grossly intact  4. HEENMT:  Head is atraumatic, normocephalic, pupils are reactive, trachea is midline, no thyromegaly, mucous membranes are moist  5. Respiratory : Lungs are clear to auscultation bilaterally  6. Cardiovascular : Heart rate and rhythm are regular no murmur, 2+ peripheral edema present  7. Gastrointestinal:  Abdomen is soft, nondistended, nontender to palpation, no masses, bowel sounds present  8. Skin:  No acute lesions on limited skin exam  9.Musculoskeletal:  Transmetatarsal amputation  of right foot    Data Review:    CBC Recent Labs  Lab 09/14/19 1554  WBC 6.0  HGB 16.0  HCT 46.1  PLT 130*  MCV 94.9  MCH 32.9  MCHC 34.7  RDW 13.5  LYMPHSABS 2.0  MONOABS 0.7  EOSABS 0.1  BASOSABS 0.0   ------------------------------------------------------------------------------------------------------------------  Results for orders placed or performed during the hospital encounter of 09/14/19 (from the past 48 hour(s))  Rapid urine drug screen (hospital performed)     Status: Abnormal   Collection Time: 09/14/19  3:35 PM  Result Value Ref Range   Opiates NONE DETECTED NONE DETECTED   Cocaine NONE DETECTED NONE DETECTED   Benzodiazepines POSITIVE (A) NONE DETECTED   Amphetamines POSITIVE (A) NONE DETECTED   Tetrahydrocannabinol NONE DETECTED NONE DETECTED   Barbiturates NONE DETECTED NONE DETECTED    Comment: (NOTE) DRUG SCREEN FOR MEDICAL PURPOSES ONLY.  IF CONFIRMATION IS NEEDED FOR ANY PURPOSE, NOTIFY LAB WITHIN 5 DAYS.  LOWEST DETECTABLE LIMITS FOR URINE DRUG SCREEN Drug Class  Cutoff (ng/mL) Amphetamine and metabolites    1000 Barbiturate and metabolites    200 Benzodiazepine                 952 Tricyclics and metabolites     300 Opiates and metabolites        300 Cocaine and metabolites        300 THC                            50 Performed at [facility], [address] [place], [place] [address]   Urinalysis, Routine w reflex microscopic     Status: Abnormal   Collection Time: 09/14/19  3:35 PM  Result Value Ref Range   Color, Urine YELLOW YELLOW   APPearance HAZY (A) CLEAR   Specific Gravity, Urine 1.027 1.005 - 1.030   pH 5.0 5.0 - 8.0   Glucose, UA >=500 (A) NEGATIVE mg/dL   Hgb urine dipstick MODERATE (A) NEGATIVE   Bilirubin Urine NEGATIVE NEGATIVE   Ketones, ur NEGATIVE NEGATIVE mg/dL   Protein, ur 30 (A) NEGATIVE mg/dL   Nitrite NEGATIVE NEGATIVE   Leukocytes,Ua SMALL (A) NEGATIVE   RBC / HPF 0-5 0 - 5 RBC/hpf    WBC, UA 21-50 0 - 5 WBC/hpf   Bacteria, UA FEW (A) NONE SEEN   WBC Clumps PRESENT     Comment: Performed at [facility], [address], [place], [place] [address]  CBC with Differential/Platelet     Status: Abnormal   Collection Time: 09/14/19  3:54 PM  Result Value Ref Range   WBC 6.0 4.0 - 10.5 K/uL   RBC 4.86 4.22 - 5.81 MIL/uL   Hemoglobin 16.0 13.0 - 17.0 g/dL   HCT 46.1 39 - 52 %   MCV 94.9 80.0 - 100.0 fL   MCH 32.9 26.0 - 34.0 pg   MCHC 34.7 30.0 - 36.0 g/dL   RDW 13.5 11.5 - 15.5 %   Platelets 130 (L) 150 - 400 K/uL   nRBC 0.0 0.0 - 0.2 %   Neutrophils Relative % 53 %   Neutro Abs 3.2 1.7 - 7.7 K/uL   Lymphocytes Relative 33 %   Lymphs Abs 2.0 0.7 - 4.0 K/uL   Monocytes Relative 12 %   Monocytes Absolute 0.7 0 - 1 K/uL   Eosinophils Relative 2 %   Eosinophils Absolute 0.1 0 - 0 K/uL   Basophils Relative 0 %   Basophils Absolute 0.0 0 - 0 K/uL   Immature Granulocytes 0 %   Abs Immature Granulocytes 0.02 0.00 - 0.07 K/uL    Comment: Performed at [facility], [address] [place], [place] [address]  Comprehensive metabolic panel     Status: Abnormal   Collection Time: 09/14/19  3:54 PM  Result Value Ref Range   Sodium 138 135 - 145 mmol/L   Potassium 3.6 3.5 - 5.1 mmol/L   Chloride 102 98 - 111 mmol/L   CO2 26 22 - 32 mmol/L   Glucose, Bld 188 (H) 70 - 99 mg/dL    Comment: Glucose reference range applies only to samples taken after fasting for at least 8 hours.   BUN 28 (H) 8 - 23 mg/dL   Creatinine, Ser 1.56 (H) 0.61 - 1.24 mg/dL   Calcium 9.2 8.9 - 10.3 mg/dL   Total Protein 7.2 6.5 - 8.1 g/dL   Albumin 3.7 3.5 - 5.0 g/dL   AST 24 15 -  41 U/L   ALT 25 0 - 44 U/L   Alkaline Phosphatase 85 38 - 126 U/L   Total Bilirubin 0.7 0.3 - 1.2 mg/dL   GFR calc non Af Amer 46 (L) >60 mL/min   GFR calc Af Amer 53 (L) >60 mL/min   Anion gap 10 5 - 15    Comment: Performed at [facility], [address], [place], [place] [address]  Protime-INR     Status: Abnormal    Collection Time: 09/14/19  3:54 PM  Result Value Ref Range   Prothrombin Time 17.0 (H) 11.4 - 15.2 seconds   INR 1.4 (H) 0.8 - 1.2    Comment: (NOTE) INR goal varies based on device and disease states. Performed at [facility], [address], [place], [place] [address]   Ammonia     Status: None   Collection Time: 09/14/19  3:54 PM  Result Value Ref Range   Ammonia 30 9 - 35 umol/L    Comment: Performed at [facility], [address], [place], [place] [address]  Valproic acid level     Status: Abnormal   Collection Time: 09/14/19  3:54 PM  Result Value Ref Range   Valproic Acid Lvl 20 (L) 50.0 - 100.0 ug/mL    Comment: Performed at [facility], [address] [place], [place] [address]  TSH     Status: Abnormal   Collection Time: 09/14/19  3:54 PM  Result Value Ref Range   TSH 0.108 (L) 0.350 - 4.500 uIU/mL    Comment: Performed by a 3rd Generation assay with a functional sensitivity of <=0.01 uIU/mL. Performed at [facility], [address], [place], [place] [address]   Troponin I (High Sensitivity)     Status: None   Collection Time: 09/14/19  3:54 PM  Result Value Ref Range   Troponin I (High Sensitivity) 4 <18 ng/L    Comment: (NOTE) Elevated high sensitivity troponin I (hsTnI) values and significant  changes across serial measurements may suggest ACS but many other  chronic and acute conditions are known to elevate hsTnI results.  Refer to the "Links" section for chest pain algorithms and additional  guidance. Performed at [facility], [address], [place], [place] [address]   CK     Status: Abnormal   Collection Time: 09/14/19  3:54 PM  Result Value Ref Range   Total CK 541 (H) 49.0 - 397.0 U/L    Comment: Performed at [facility], [address] [place], [place] [address]  Ethanol     Status: None   Collection Time: 09/14/19  3:59 PM  Result Value Ref Range   Alcohol, Ethyl (B) <10 <10 mg/dL    Comment: (NOTE) Lowest detectable limit for serum alcohol is 10  mg/dL.  For medical purposes only. Performed at [facility], [address] [place], [place] [address]   Blood gas, arterial     Status: Abnormal   Collection Time: 09/14/19  4:05 PM  Result Value Ref Range   FIO2 21.00    pH, Arterial 7.368 7.35 - 7.45   pCO2 arterial 49.9 (H) 32 - 48 mmHg   pO2, Arterial 70.3 (L) 83 - 108 mmHg   Bicarbonate 26.2 20.0 - 28.0 mmol/L   Acid-Base Excess 3.2 (H) 0.0 - 2.0 mmol/L   O2 Saturation 94.5 %   Patient temperature 36.5    Allens test (pass/fail) PASS PASS    Comment: Performed at [facility], [address], [place], [place] [address]  CBG monitoring, ED     Status: Abnormal   Collection Time: 09/14/19  4:06 PM  Result Value Ref Range   Glucose-Capillary 167 (H) 70 - 99 mg/dL    Comment: Glucose reference range applies only to samples taken after fasting for at least 8 hours.   *Note: Due to a large number of results and/or encounters for the requested time period, some results have not been displayed. A complete set of results can be found in Results Review.    Chemistries  Recent Labs  Lab 09/13/19 1714 09/14/19 1554  NA 138 138  K 4.7 3.6  CL 102 102  CO2 28 26  GLUCOSE 190* 188*  BUN 26* 28*  CREATININE 1.69* 1.56*  CALCIUM 9.6 9.2  AST 17 24  ALT 23 25  ALKPHOS  --  85  BILITOT 0.8 0.7   ------------------------------------------------------------------------------------------------------------------  ------------------------------------------------------------------------------------------------------------------ GFR: Estimated Creatinine Clearance: 60.1 mL/min (A) (by C-G formula based on SCr of 1.56 mg/dL (H)). Liver Function Tests: Recent Labs  Lab 09/13/19 1714 09/14/19 1554  AST 17 24  ALT 23 25  ALKPHOS  --  85  BILITOT 0.8 0.7  PROT 7.1 7.2  ALBUMIN  --  3.7   No results for input(s): LIPASE, AMYLASE in the last 168 hours. Recent Labs  Lab 09/14/19 1554  AMMONIA 30   Coagulation Profile: Recent  Labs  Lab 09/14/19 1554  INR 1.4*   Cardiac Enzymes: Recent Labs  Lab 09/14/19 1554  CKTOTAL 541*   BNP (last 3 results) No results for input(s): PROBNP in the last 8760 hours. HbA1C: No results for input(s): HGBA1C in the last 72 hours. CBG: Recent Labs  Lab 09/14/19 1606  GLUCAP 167*   Lipid Profile: No results for input(s): CHOL, HDL, LDLCALC, TRIG, CHOLHDL, LDLDIRECT in the last 72 hours. Thyroid Function Tests: Recent Labs    09/14/19 1554  TSH 0.108*   Anemia Panel: No results for input(s): VITAMINB12, FOLATE, FERRITIN, TIBC, IRON, RETICCTPCT in the last 72 hours.  --------------------------------------------------------------------------------------------------------------- Urine analysis:    Component Value Date/Time   COLORURINE YELLOW 09/14/2019 1535   APPEARANCEUR HAZY (A) 09/14/2019 1535   LABSPEC 1.027 09/14/2019 1535   PHURINE 5.0 09/14/2019 1535   GLUCOSEU >=500 (A) 09/14/2019 1535   GLUCOSEU NEG mg/dL 08/21/2006 0220   HGBUR MODERATE (A) 09/14/2019 1535   HGBUR trace-intact 01/01/2007 1340   BILIRUBINUR NEGATIVE 09/14/2019 1535   BILIRUBINUR negative 07/11/2015 1413   BILIRUBINUR neg 08/09/2014 1401   KETONESUR NEGATIVE 09/14/2019 1535   PROTEINUR 30 (A) 09/14/2019 1535   UROBILINOGEN 1.0 07/11/2015 1413   UROBILINOGEN 0.2 01/01/2007 1340   NITRITE NEGATIVE 09/14/2019 1535   LEUKOCYTESUR SMALL (A) 09/14/2019 1535      Imaging Results:    CT Head Wo Contrast  Result Date: 09/14/2019 CLINICAL DATA:  Mental status changes, unknown cause. Generalized weakness and dizziness. Several falls today. EXAM: CT HEAD WITHOUT CONTRAST TECHNIQUE: Contiguous axial images were obtained from the base of the skull through the vertex without intravenous contrast. COMPARISON:  MRI 09/24/2017 and report of CT of the head on 09/28/2017, CT of the head on 06/28/2016 FINDINGS: Brain: No evidence of acute infarction, hemorrhage, hydrocephalus, extra-axial collection  or mass lesion/mass effect. Vascular: No hyperdense vessel or unexpected calcification. Skull: Normal. Negative for fracture or focal lesion. Sinuses/Orbits: No acute finding. Other: None. IMPRESSION: No evidence for acute intracranial abnormality. Electronically Signed   By: Nolon Nations M.D.   On: 09/14/2019 17:53   DG Chest  Port 1 View  Result Date: 09/14/2019 CLINICAL DATA:  64 year old male with altered mental status. EXAM: PORTABLE CHEST 1 VIEW COMPARISON:  Chest radiograph dated 10/16/2017. FINDINGS: There is cardiomegaly with mild vascular congestion. No focal consolidation, pleural effusion, pneumothorax. No acute osseous pathology. IMPRESSION: Cardiomegaly with mild vascular congestion. No focal consolidation. Electronically Signed   By: Anner Crete M.D.   On: 09/14/2019 16:06    My personal review of EKG: Rhythm NSR, Rate 80 /min, QTc 439 ,no Acute ST changes, occasional PAC   Assessment & Plan:    Active Problems:   Generalized weakness   1. Acute metabolic encephalopathy 1. Patient is somnolent,  arousable to voice 2. Etiology likely to be infectious given UTI 3. Polypharmacy also contributing -patient is on 1 mg of Xanax 4 times a day, Lyrica, tizanidine, Ambien -see plan below regarding this 4. Alcohol level is not detectable 5. Patient was not responsive to Narcan, no opiates and urine 6. CT head shows no evidence of acute intracranial abnormality 7. TSH is low check T4, decrease levothyroxine from 50 mcg to 25 mcg -we will need TSH repeated in 6 weeks 8. No evidence of DKA 9. BUN minimally elevated in setting of CKD, ammonia normal 10. ABG shows PCO2 of 49.9, PO2 70.3 -oxygen saturations are normal at this time on room air 11. Seizure-like activity and loss of time were denied 12. Continue to monitor 2. Generalized weakness 1. Secondary to UTI? 2. Secondary to polypharmacy? 3. Treat both of the above 4. Consult PT -in an outpatient chart reveals that he had  been referred to PT, but reported that he "did not get along with PT" and therefore discontinued physical therapy on his own. 3. Multiple falls 1. Secondary to generalized weakness 2. Fall precautions 3. Orthostatics Qshift - although patient denies lightheadedness 4. See plan above 4. UTI 1. Continue Rocephin 2. Urine culture pending 3. No positive urine culture in chart review 5. Polypharmacy 1. patient is on 1 mg of Xanax 4 times a day, Lyrica, tizanidine, Ambien 2. Decrease Xanax to 0.5 mg twice daily, decrease Lyrica 200 mg twice daily, hold muscle relaxer, decrease Ambien to 5 mg nightly. 3. Continue Rexulti at prescribed dose 4. Patient will likely need to be weaned off of Xanax in the outpatient setting 5. Holding Adderall -patient does not work outside of the home and can likely be weaned off of Adderall in the outpatient setting 6. Diabetes mellitus type 2 1. Patient reports taking 150 units of Lantus at night-decreasing to 130 units in the hospital given controlled diet 2. Patient reports 25 units of NovoLog with meals at home -ordering 15 units with meals given controlled diet 3. Sliding scale nightly coverage 4. Last hemoglobin A1c was 6.2, 3 weeks ago 5. Heart healthy carb controlled diet 6. Continue to monitor 7. Clotting disorder 1. Patient reports history of multiple DVTs, and one episode of pulmonary emboli 2. Continue Xarelto 8. ADHD 1. Holding Adderall   DVT Prophylaxis-Xarelto- SCDs   AM Labs Ordered, also please review Full Orders  Family Communication: No family at bedside Code Status: Full  Admission status: Observation:The appropriate admission status for this patient is observation. The patient's presenting symptoms, physical exam findings, and initial radiographic and laboratory data in the context of their chronic comorbidities is felt to place them at high risk for further clinical deterioration.    Time spent in minutes : [place]

## 2019-09-15 ENCOUNTER — Encounter: Payer: Self-pay | Admitting: Family

## 2019-09-15 DIAGNOSIS — I10 Essential (primary) hypertension: Secondary | ICD-10-CM | POA: Diagnosis not present

## 2019-09-15 DIAGNOSIS — R531 Weakness: Secondary | ICD-10-CM | POA: Diagnosis not present

## 2019-09-15 DIAGNOSIS — G9340 Encephalopathy, unspecified: Secondary | ICD-10-CM | POA: Diagnosis not present

## 2019-09-15 DIAGNOSIS — N390 Urinary tract infection, site not specified: Secondary | ICD-10-CM

## 2019-09-15 LAB — COMPREHENSIVE METABOLIC PANEL
ALT: 24 U/L (ref 0–44)
AST: 21 U/L (ref 15–41)
Albumin: 3.5 g/dL (ref 3.5–5.0)
Alkaline Phosphatase: 78 U/L (ref 38–126)
Anion gap: 10 (ref 5–15)
BUN: 25 mg/dL — ABNORMAL HIGH (ref 8–23)
CO2: 27 mmol/L (ref 22–32)
Calcium: 9 mg/dL (ref 8.9–10.3)
Chloride: 105 mmol/L (ref 98–111)
Creatinine, Ser: 1.42 mg/dL — ABNORMAL HIGH (ref 0.61–1.24)
GFR calc Af Amer: 59 mL/min — ABNORMAL LOW (ref >60)
GFR calc non Af Amer: 51 mL/min — ABNORMAL LOW (ref >60)
Glucose, Bld: 149 mg/dL — ABNORMAL HIGH (ref 70–99)
Potassium: 4 mmol/L (ref 3.5–5.1)
Sodium: 142 mmol/L (ref 135–145)
Total Bilirubin: 0.7 mg/dL (ref 0.3–1.2)
Total Protein: 6.7 g/dL (ref 6.5–8.1)

## 2019-09-15 LAB — CBC
HCT: 46.8 % (ref 39.0–52.0)
Hemoglobin: 15.9 g/dL (ref 13.0–17.0)
MCH: 32.4 pg (ref 26.0–34.0)
MCHC: 34 g/dL (ref 30.0–36.0)
MCV: 95.3 fL (ref 80.0–100.0)
Platelets: 125 10*3/uL — ABNORMAL LOW (ref 150–400)
RBC: 4.91 MIL/uL (ref 4.22–5.81)
RDW: 13.6 % (ref 11.5–15.5)
WBC: 6 10*3/uL (ref 4.0–10.5)
nRBC: 0 % (ref 0.0–0.2)

## 2019-09-15 LAB — GLUCOSE, CAPILLARY
Glucose-Capillary: 141 mg/dL — ABNORMAL HIGH (ref 70–99)
Glucose-Capillary: 143 mg/dL — ABNORMAL HIGH (ref 70–99)
Glucose-Capillary: 84 mg/dL (ref 70–99)
Glucose-Capillary: 151 mg/dL — ABNORMAL HIGH (ref 70–99)

## 2019-09-15 LAB — VITAMIN B12: Vitamin B-12: 424 pg/mL (ref 180–914)

## 2019-09-15 LAB — MAGNESIUM: Magnesium: 2.3 mg/dL (ref 1.7–2.4)

## 2019-09-15 LAB — T4, FREE: Free T4: 1.04 ng/dL (ref 0.61–1.12)

## 2019-09-15 MED ORDER — ALPRAZOLAM 0.5 MG PO TABS
0.5000 mg | ORAL_TABLET | Freq: Four times a day (QID) | ORAL | Status: DC | PRN
  Administered 2019-09-15: 0.5 mg via ORAL
  Filled 2019-09-15: qty 1

## 2019-09-15 MED ORDER — INSULIN ASPART 100 UNIT/ML ~~LOC~~ SOLN: 8.0000 [IU] | Freq: Three times a day (TID) | SUBCUTANEOUS | Status: DC

## 2019-09-15 MED ORDER — DICLOFENAC SODIUM 1 % EX GEL
2.0000 g | Freq: Three times a day (TID) | CUTANEOUS | Status: DC
  Administered 2019-09-15 – 2019-09-16 (×3): 2 g via TOPICAL
  Filled 2019-09-15: qty 100

## 2019-09-15 NOTE — Progress Notes (Addendum)
PROGRESS NOTE    Daniel [name] [facility]  YJE:[id number] DOB: Dec 22, 1955 DOA: 09/14/2019 PCP: [name], MD   Chief Complaint  Patient presents with  . Fall    Brief Narrative:  As per H&P written by Dr. Clearence Ped on 09/14/19 Daniel [name]  is a 64 y.o. male, with history of peripheral vascular disease, sleep apnea, liver disease, HIV, GERD, hypertension, diabetes mellitus type 2, depression, anxiety, history of DVTs and PE, chronic kidney disease, and ADHD presents to the hospital with a chief complaint of multiple falls.  Patient reports that he has had 5-6 falls over the last 24 hours.  He reports that he does not trip and fall, does not have chest pain prior to fall, does not have palpitations, does not have shortness of breath, does not have headache, does not feel lightheaded.  He reports it is just "boom" and he is on the ground.  He has not lost consciousness.  He has not hit his head.  When asked if his legs just give out, patient reports "it's something like that."  No provocative factors, nothing is made it better.  Patient reports that with the first several falls he was able to get himself up off the floor.  With his last fall he was down on the ground for an unknown period of time.  Patient's sense of time is not adequate for revealing how long he was down, as he reports that he fell at 1 PM and was picked up by EMS at 12:45 PM.  In any event, his aunt's home health aide arrived to find him on the floor and was not able to get him up she called EMS.  Patient reports no associated symptoms.  He reports that he has had episode like this before approximately 2 years ago.  Chart review reveals that he was in the ER in August 2019 for generalized weakness.  At that time he was having trouble walking on his right foot after a transmetatarsal amputation secondary to osteomyelitis.  Patient was also somnolent but arousable and followed commands without difficulty.  Patient was able to stand on his  own at that visit and was ultimately discharged home.  Chart review also reveals an office visit for right-sided sciatic pain earlier this month.  He was referred to Ortho and it does not appear that that appointment has been completed yet.  In the ER: Temp 98.7, blood pressure 140/76, O2 sat 98%, heart rate 81, respiratory rate 19 No leukocytosis, no anemia Electrolytes are stable Creatinine is at baseline Hyperglycemic at 188 TSH is low at 0.108 -levothyroxine reduced at admission to 25 mcg Valproic acid level is low at 20 -patient reports that he is compliant with his medication Ammonia is normal at 30 CPK is 541 Troponin is 4 Alcohol level is not detectable UA is positive for UTI -21-50 white blood cells small leukocytes patient admits to occasional dysuria Urine drug screen is positive for amphetamines and benzos -patient is prescribed both CT head shows no evidence of acute intracranial abnormality Chest x-ray shows cardiomegaly with mild vascular congestion no consolidation, effusion, or pneumothorax Admission requested for acute metabolic encephalopathy, generalized weakness, multiple falls   Assessment & Plan: 1-generalized weakness and fall -Multifactorial: In the setting of UTI, questionable seizure disorder and polypharmacy -Continue treatment with UTI -Maintain adequate hydration -Continue minimizing medications that can affect his sensorium -Check EEG, B12 and RPR -Neurology has been consulted for recommendations regarding further adjustments to his antiepileptic drugs.  2-hypothyroidism -TSH low at 0.108 -Synthroid has been reduced to 25 mcg daily -Repeat thyroid panel in 6-8 weeks.  3-UTI -As mentioned above continue empiric Rocephin -Follow culture results.  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  5-type 2 diabetes mellitus with nephropathy -Continue sliding scale insulin and adjusted dose of  Lantus -Modified carbohydrate diet has been recommended  6-acute kidney injury on chronic kidney disease a stage IIIa -In the setting of dehydration/prerenal azotemia. -Continue to minimize the use of nephrotoxic agents -IV fluid has been provided -Follow creatinine trend -At baseline creatinine around 1.5; on presentation creatinine 1.69.  7-history of HIV -Continue home antiviral regimen  8-history of gastroesophageal reflux disease -Continue PPI.  9-anxiety -continue as needed adjusted dose of Xanax.  10-morbid obesity -Low calorie diet, portion control and increase physical activity discussed with patient. -Body mass index is 41.27 kg/m.   DVT prophylaxis: Chronically on Xarelto. Code Status: Full code Family Communication: No family at bedside. Disposition:   Status is: Observation  Dispo: The patient is from: Home              Anticipated d/c is to: Home              Anticipated d/c date is: 7/23              Patient currently No medically ready to go home yet; completing work-up to rule out component of seizure as cause for falls. Neurology has been consulted. Will also continue treatment for UTI while following urine culture final results. Physical therapy not recommended home health services and finding patient back to baseline functional mobility and gait level. Will check B12, RPR and EEG.   Consultants:   Neurology service   Procedures:  See below for x-ray reports   Antimicrobials:  Rocephin   Subjective: Oriented x3 currently; reports no chest pain, no nausea or vomiting, no fever. Feeling weak and expressing intermittent dysuria. No further episodes of blanking out or falling since admission.  Objective: Vitals:   09/15/19 0022 09/15/19 0436 09/15/19 0858 09/15/19 1539  BP: 121/77 110/63 107/62 108/72  Pulse: 77 75 80 80  Resp: 20 20 18 18   Temp: 97.7 F (36.5 C) 98.8 F (37.1 C) 98.4 F (36.9 C) 98 F (36.7 C)  TempSrc: Oral Oral Oral  Oral  SpO2: 99% 95% 95% 97%  Weight:      Height:        Intake/Output Summary (Last 24 hours) at 09/15/2019 1652 Last data filed at 09/14/2019 2041 Gross per 24 hour  Intake 90 ml  Output 700 ml  Net -610 ml   Filed Weights   09/14/19 1452 09/14/19 2100  Weight: 115 kg 112.5 kg    Examination:  General exam: Appears calm and comfortable, no chest pain, no vomiting. Still reporting some dysuria intermittently and feeling weak. Patient denies shortness of breath. Currently afebrile Respiratory system: Clear to auscultation. Respiratory effort normal. No using accessory muscle. Cardiovascular system: S1 & S2 heard, RRR. No JVD, murmurs, rubs, gallops or clicks. No pedal edema. Gastrointestinal system: Abdomen is obese, nondistended, soft and nontender. No organomegaly or masses felt. Normal bowel sounds heard. Central nervous system: Alert and oriented. No focal neurological deficits. Extremities: No cyanosis or clubbing; trace edema appreciated bilaterally. Skin: No rashes, no petechiae. Psychiatry: Judgement and insight appear normal. Mood & affect appropriate.     Data Reviewed: I have personally reviewed following labs and imaging studies  CBC: Recent Labs  Lab 09/14/19 1554 09/15/19 0415  WBC 6.0 6.0  NEUTROABS 3.2  --   HGB 16.0 15.9  HCT 46.1 46.8  MCV 94.9 95.3  PLT 130* 125*    Basic Metabolic Panel: Recent Labs  Lab 09/13/19 1714 09/14/19 1554 09/15/19 0415  NA 138 138 142  K 4.7 3.6 4.0  CL 102 102 105  CO2 28 26 27   GLUCOSE 190* 188* 149*  BUN 26* 28* 25*  CREATININE 1.69* 1.56* 1.42*  CALCIUM 9.6 9.2 9.0  MG  --   --  2.3    GFR: Estimated Creatinine Clearance: 59.3 mL/min (A) (by C-G formula based on SCr of 1.42 mg/dL (H)).  Liver Function Tests: Recent Labs  Lab 09/13/19 1714 09/14/19 1554 09/15/19 0415  AST 17 24 21   ALT 23 25 24   ALKPHOS  --  85 78  BILITOT 0.8 0.7 0.7  PROT 7.1 7.2 6.7  ALBUMIN  --  3.7 3.5    CBG: Recent  Labs  Lab 09/14/19 1606 09/14/19 2139 09/15/19 0744 09/15/19 1125  GLUCAP 167* 133* 141* 143*     Recent Results (from the past 240 hour(s))  SARS Coronavirus 2 by RT PCR (hospital order, performed in [facility] hospital lab) Nasopharyngeal Urine, Catheterized     Status: None   Collection Time: 09/14/19  8:06 PM   Specimen: Urine, Catheterized; Nasopharyngeal  Result Value Ref Range Status   SARS Coronavirus 2 NEGATIVE NEGATIVE Final    Comment: (NOTE) SARS-CoV-2 target nucleic acids are NOT DETECTED.  The SARS-CoV-2 RNA is generally detectable in upper and lower respiratory specimens during the acute phase of infection. The lowest concentration of SARS-CoV-2 viral copies this assay can detect is 250 copies / mL. A negative result does not preclude SARS-CoV-2 infection and should not be used as the sole basis for treatment or other patient management decisions.  A negative result may occur with improper specimen collection / handling, submission of specimen other than nasopharyngeal swab, presence of viral mutation(s) within the areas targeted by this assay, and inadequate number of viral copies (<250 copies / mL). A negative result must be combined with clinical observations, patient history, and epidemiological information.  Fact Sheet for Patients:   StrictlyIdeas.no  Fact Sheet for Healthcare Providers: BankingDealers.co.za  This test is not yet approved or  cleared by the [place] FDA and has been authorized for detection and/or diagnosis of SARS-CoV-2 by FDA under an Emergency Use Authorization (EUA).  This EUA will remain in effect (meaning this test can be used) for the duration of the COVID-19 declaration under Section 564(b)(1) of the Act, 21 U.S.C. section 360bbb-3(b)(1), unless the authorization is terminated or revoked sooner.  Performed at [facility], [address], [place], [place] [address]       Radiology Studies: CT Head Wo Contrast  Result Date: 09/14/2019 CLINICAL DATA:  Mental status changes, unknown cause. Generalized weakness and dizziness. Several falls today. EXAM: CT HEAD WITHOUT CONTRAST TECHNIQUE: Contiguous axial images were obtained from the base of the skull through the vertex without intravenous contrast. COMPARISON:  MRI 09/24/2017 and report of CT of the head on 09/28/2017, CT of the head on 06/28/2016 FINDINGS: Brain: No evidence of acute infarction, hemorrhage, hydrocephalus, extra-axial collection or mass lesion/mass effect. Vascular: No hyperdense vessel or unexpected calcification. Skull: Normal. Negative for fracture or focal lesion. Sinuses/Orbits: No acute finding. Other: None. IMPRESSION: No evidence for acute intracranial abnormality. Electronically Signed   By: Benjamine Mola  Owens Shark M.D.   On: 09/14/2019 17:53   DG Chest Port 1 View  Result Date: 09/14/2019 CLINICAL DATA:  64 year old male with altered mental status. EXAM: PORTABLE CHEST 1 VIEW COMPARISON:  Chest radiograph dated 10/16/2017. FINDINGS: There is cardiomegaly with mild vascular congestion. No focal consolidation, pleural effusion, pneumothorax. No acute osseous pathology. IMPRESSION: Cardiomegaly with mild vascular congestion. No focal consolidation. Electronically Signed   By: Anner Crete M.D.   On: 09/14/2019 16:06     Scheduled Meds: . abacavir-dolutegravir-lamiVUDine  1 tablet Oral QHS  . brexpiprazole  1 mg Oral QHS  . divalproex  500 mg Oral QHS  . docusate sodium  100 mg Oral BID  . ezetimibe  10 mg Oral QHS  . furosemide  40 mg Oral Daily  . insulin aspart  0-5 Units Subcutaneous QHS  . insulin aspart  15 Units Subcutaneous TID WC  . insulin glargine  130 Units Subcutaneous Daily  . levothyroxine  25 mcg Oral QHS  . pantoprazole  40 mg Oral Q0600  . pregabalin  100 mg Oral BID  . rivaroxaban  20 mg Oral Q supper  . vortioxetine HBr  20 mg Oral QHS   Continuous Infusions: .  cefTRIAXone (ROCEPHIN)  IV       LOS: 0 days    Time spent: 30 minutes   Barton Dubois, MD [facility]   To contact the attending provider between 7A-7P or the covering provider during after hours 7P-7A, please log into the web site [email or web address] and access using universal [place] password for that web site. If you do not have the password, please call the hospital operator.  09/15/2019, 4:52 PM

## 2019-09-15 NOTE — TOC Initial Note (Addendum)
Transition of Care [facility]) - Initial/Assessment Note    Patient Details  Name: Daniel [name] MRN: [id number] Date of Birth: Dec 23, 1955  Transition of [facility]) CM/SW Contact:    Boneta Lucks, RN Phone Number: 09/15/2019, 11:33 AM  Clinical Narrative:       TOC saw order for DME - CPAP, discussed with patient he has CPAP at home and keep appointments, and is up to date on settings. He has no DME needs and PT has no recommendations.              Expected Discharge Plan: Home/Self Care Barriers to Discharge: Continued Medical Work up   Patient Goals and CMS Choice Patient states their goals for this hospitalization and ongoing recovery are:: to go home. CMS Medicare.gov Compare Post Acute Care list provided to:: Patient    Expected Discharge Plan and Services Expected Discharge Plan: Home/Self Care        Prior Living Arrangements/Services   Lives with:: Self          Need for Family Participation in Patient Care: Yes (Comment) Care giver support system in place?: Yes (comment)   Criminal Activity/Legal Involvement Pertinent to Current Situation/Hospitalization: No - Comment as needed    Permission Sought/Granted     Emotional Assessment     Affect (typically observed): Accepting Orientation: : Oriented to Self, Oriented to Place, Oriented to  Time, Oriented to Situation Alcohol / Substance Use: Not Applicable Psych Involvement: No (comment)  Admission diagnosis:  Acute encephalopathy [G93.40] Generalized weakness [R53.1] Urinary tract infection without hematuria, site unspecified [N39.0] Patient Active Problem List   Diagnosis Date Noted  . Generalized weakness 09/14/2019  . Severe major depression ([place]) 11/17/2018  . Class 2 obesity due to excess calories without serious comorbidity with body mass index (BMI) of 36.0 to 36.9 in adult 11/17/2018  . Diabetes ([place]) 11/09/2018  . Anticoagulated 08/24/2018  . Dyslipidemia 07/27/2018  . Essential hypertension  06/25/2018  . Hypogonadism in male 05/10/2018  . Numbness 03/31/2018  . Diabetic peripheral neuropathy ([place]) 01/25/2018  . History of syncope 01/25/2018  . S/P transmetatarsal amputation of foot, right ([place]) 10/09/2017  . Osteomyelitis ([place])   . Hypothyroidism   . Constipation   . History of DVT (deep vein thrombosis) 09/30/2017  . Morbid obesity due to excess calories ([place]) complicated by DM / hyperlipidemia 01/15/2017  . DOE (dyspnea on exertion) 01/13/2017  . Chronic migraine 09/17/2016  . Gait abnormality 09/17/2016  . Diabetic foot infection ([place]) 06/28/2016  . Fall 12/05/2015  . Toe ulcer, right ([place]) 09/19/2015  . Decreased pedal pulses 09/19/2015  . OSA on CPAP 09/05/2015  . Major depressive disorder, recurrent episode, moderate ([place]) 09/05/2015  . Low back pain 06/11/2015  . Abnormality of gait 06/11/2015  . Dizziness 03/17/2015  . Weakness 02/21/2015  . Chronic renal insufficiency, stage III (moderate) ([place]) 08/09/2014  . Insulin-requiring or dependent type II diabetes mellitus ([place]) 11/07/2013  . Hematuria 06/21/2013  . Hepatic steatosis 09/09/2010  . Human immunodeficiency virus (HIV) disease ([place]) 06/04/2006  . HERPES ZOSTER, UNCOMPLICATED 83/66/2947  . Depression 06/04/2006  . THROMBOPHLEBITIS NOS 06/04/2006  . GERD 06/04/2006  . ARTHRITIS, HAND 06/04/2006   PCP:  [name], MD Pharmacy:   [facility], [place] [address] Phone: [phone] Fax: [phone]

## 2019-09-15 NOTE — Evaluation (Signed)
Physical Therapy Evaluation Patient Details Name: Daniel [name] MRN: [id number] DOB: 17-Dec-1955 Today's Date: 09/15/2019   History of Present Illness  Daniel [name]  is a 64 y.o. male, with history of peripheral vascular disease, sleep apnea, liver disease, HIV, GERD, hypertension, diabetes mellitus type 2, depression, anxiety, history of DVTs and PE, chronic kidney disease, and ADHD presents to the hospital with a chief complaint of multiple falls.  Patient reports that he has had 5-6 falls over the last 24 hours.  He reports that he does not trip and fall, does not have chest pain prior to fall, does not have palpitations, does not have shortness of breath, does not have headache, does not feel lightheaded.  He reports it is just "boom" and he is on the ground.  He has not lost consciousness.  He has not hit his head.  When asked if his legs just give out, patient reports "it's something like that."  No provocative factors, nothing is made it better.  Patient reports that with the first several falls he was able to get himself up off the floor.  With his last fall he was down on the ground for an unknown period of time.  Patient's sense of time is not adequate for revealing how long he was down, as he reports that he fell at 1 PM and was picked up by EMS at 12:45 PM.  In any event, his aunt's home health aide arrived to find him on the floor and was not able to get him up she called EMS.  Patient reports no associated symptoms.  He reports that he has had episode like this before approximately 2 years ago.  Chart review reveals that he was in the ER in August 2019 for generalized weakness.  At that time he was having trouble walking on his right foot after a transmetatarsal amputation secondary to osteomyelitis.  Patient was also somnolent but arousable and followed commands without difficulty.  Patient was able to stand on his own at that visit and was ultimately discharged home.  Chart review also reveals  an office visit for right-sided sciatic pain earlier this month.  He was referred to Ortho and it does not appear that that appointment has been completed yet.    Clinical Impression  Patient functioning at baseline for functional mobility and gait.  Patient demonstrates good return for ambulation on level, inclined, declined and stairs using 1 side rail without loss of balance.  Plan:  Patient discharged from physical therapy to care of nursing for ambulation daily as tolerated for length of stay.     Follow Up Recommendations No PT follow up    Equipment Recommendations  None recommended by PT    Recommendations for Other Services       Precautions / Restrictions Precautions Precautions: None Restrictions Weight Bearing Restrictions: No      Mobility  Bed Mobility Overal bed mobility: Modified Independent                Transfers Overall transfer level: Modified independent Equipment used: None                Ambulation/Gait Ambulation/Gait assistance: Modified independent (Device/Increase time) Gait Distance (Feet): 200 Feet Assistive device: None Gait Pattern/deviations: WFL(Within Functional Limits) Gait velocity: decreased   General Gait Details: demonstrates good return for ambulation on level, inclined and declined surfaces without loss of balance  Stairs Stairs: Yes Stairs assistance: Modified independent (Device/Increase time) Stair Management: One rail Left;Alternating  pattern Number of Stairs: 10 General stair comments: demonstrates good return for going up/down stairs using 1 siderail without loss of balance  Wheelchair Mobility    Modified Rankin (Stroke Patients Only)       Balance Overall balance assessment: No apparent balance deficits (not formally assessed)                                           Pertinent Vitals/Pain Pain Assessment: No/denies pain    Home Living Family/patient expects to be discharged  to:: Private residence Living Arrangements: Other relatives Available Help at Discharge: Family;Available PRN/intermittently Type of Home: House Home Access: Stairs to enter Entrance Stairs-Rails: None Entrance Stairs-Number of Steps: 1 Home Layout: Two level Home Equipment: Walker - 2 wheels;Crutches;Cane - single point;Shower seat;Bedside commode;Wheelchair - manual      Prior Function Level of Independence: Independent         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, drives     Hand Dominance   Dominant Hand: Right    Extremity/Trunk Assessment   Upper Extremity Assessment Upper Extremity Assessment: Overall WFL for tasks assessed    Lower Extremity Assessment Lower Extremity Assessment: Overall WFL for tasks assessed    Cervical / Trunk Assessment Cervical / Trunk Assessment: Normal  Communication   Communication: No difficulties  Cognition Arousal/Alertness: Awake/alert Behavior During Therapy: WFL for tasks assessed/performed Overall Cognitive Status: Within Functional Limits for tasks assessed                                        General Comments      Exercises     Assessment/Plan    PT Assessment Patent does not need any further PT services  PT Problem List         PT Treatment Interventions      PT Goals (Current goals can be found in the Care Plan section)  Acute Rehab PT Goals Patient Stated Goal: return home PT Goal Formulation: With patient Time For Goal Achievement: 09/15/19 Potential to Achieve Goals: Good    Frequency     Barriers to discharge        Co-evaluation               AM-PAC PT "6 Clicks" Mobility  Outcome Measure Help needed turning from your back to your side while in a flat bed without using bedrails?: None Help needed moving from lying on your back to sitting on the side of a flat bed without using bedrails?: None Help needed moving to and from a bed to a chair  (including a wheelchair)?: None Help needed standing up from a chair using your arms (e.g., wheelchair or bedside chair)?: None Help needed to walk in hospital room?: None Help needed climbing 3-5 steps with a railing? : None 6 Click Score: 24    End of Session   Activity Tolerance: Patient tolerated treatment well Patient left: in chair;with call bell/phone within reach Nurse Communication: Mobility status PT Visit Diagnosis: Unsteadiness on feet (R26.81);Other abnormalities of gait and mobility (R26.89);Muscle weakness (generalized) (M62.81)    Time: 1610-9604 PT Time Calculation (min) (ACUTE ONLY): 21 min   Charges:   PT Evaluation $PT Eval Moderate Complexity: 1 Mod PT Treatments $Therapeutic Activity: 8-22 mins  2:43 PM, 09/15/19 Lonell Grandchild, MPT Physical Therapist with [facility] [phone] office [phone] mobile phone

## 2019-09-15 NOTE — Care Management Obs Status (Signed)
[place] NOTIFICATION   Patient Details  Name: Daniel [name] MRN: [id number] Date of Birth: 10/07/1955   Medicare Observation Status Notification Given:  Yes    Tommy Medal 09/15/2019, 4:12 PM

## 2019-09-16 ENCOUNTER — Observation Stay (HOSPITAL_COMMUNITY)
Admit: 2019-09-16 | Discharge: 2019-09-16 | Disposition: A | Discharge status: Home / Self Care | Payer: PPO | Attending: Internal Medicine | Admitting: Internal Medicine

## 2019-09-16 DIAGNOSIS — R531 Weakness: Secondary | ICD-10-CM | POA: Diagnosis not present

## 2019-09-16 DIAGNOSIS — R4182 Altered mental status, unspecified: Secondary | ICD-10-CM | POA: Diagnosis not present

## 2019-09-16 DIAGNOSIS — I10 Essential (primary) hypertension: Secondary | ICD-10-CM | POA: Diagnosis not present

## 2019-09-16 DIAGNOSIS — R296 Repeated falls: Secondary | ICD-10-CM | POA: Diagnosis not present

## 2019-09-16 DIAGNOSIS — G9340 Encephalopathy, unspecified: Secondary | ICD-10-CM

## 2019-09-16 DIAGNOSIS — R569 Unspecified convulsions: Secondary | ICD-10-CM | POA: Diagnosis not present

## 2019-09-16 DIAGNOSIS — N390 Urinary tract infection, site not specified: Secondary | ICD-10-CM | POA: Diagnosis not present

## 2019-09-16 LAB — COMPLETE METABOLIC PANEL WITH GFR
AG Ratio: 1.5 (calc) (ref 1.0–2.5)
ALT: 23 U/L (ref 9–46)
AST: 17 U/L (ref 10–35)
Albumin: 4.3 g/dL (ref 3.6–5.1)
Alkaline phosphatase (APISO): 99 U/L (ref 35–144)
BUN/Creatinine Ratio: 15 (calc) (ref 6–22)
BUN: 26 mg/dL — ABNORMAL HIGH (ref 7–25)
CO2: 28 mmol/L (ref 20–32)
Calcium: 9.6 mg/dL (ref 8.6–10.3)
Chloride: 102 mmol/L (ref 98–110)
Creat: 1.69 mg/dL — ABNORMAL HIGH (ref 0.70–1.25)
GFR, Est African American: 48 mL/min/{1.73_m2} — ABNORMAL LOW (ref >=60)
GFR, Est Non African American: 41 mL/min/{1.73_m2} — ABNORMAL LOW (ref >=60)
Globulin: 2.8 g/dL (calc) (ref 1.9–3.7)
Glucose, Bld: 190 mg/dL — ABNORMAL HIGH (ref 65–99)
Potassium: 4.7 mmol/L (ref 3.5–5.3)
Sodium: 138 mmol/L (ref 135–146)
Total Bilirubin: 0.8 mg/dL (ref 0.2–1.2)
Total Protein: 7.1 g/dL (ref 6.1–8.1)

## 2019-09-16 LAB — HIV-1 RNA QUANT-NO REFLEX-BLD
HIV 1 RNA Quant: 21 copies/mL — ABNORMAL HIGH
HIV-1 RNA Quant, Log: 1.32 Log copies/mL — ABNORMAL HIGH

## 2019-09-16 LAB — GLUCOSE, CAPILLARY
Glucose-Capillary: 81 mg/dL (ref 70–99)
Glucose-Capillary: 97 mg/dL (ref 70–99)
Glucose-Capillary: 125 mg/dL — ABNORMAL HIGH (ref 70–99)

## 2019-09-16 LAB — URINE CULTURE: Culture: >=100000 — AB

## 2019-09-16 LAB — RPR: RPR Ser Ql: NONREACTIVE

## 2019-09-16 MED ORDER — CEFDINIR 300 MG PO CAPS
300.0000 mg | ORAL_CAPSULE | Freq: Two times a day (BID) | ORAL | 0 refills | Status: AC
Start: 2019-09-16 — End: 2019-09-21

## 2019-09-16 MED ORDER — ALPRAZOLAM 1 MG PO TABS
1.0000 mg | ORAL_TABLET | Freq: Four times a day (QID) | ORAL | Status: DC | PRN
  Administered 2019-09-16: 1 mg via ORAL
  Filled 2019-09-16: qty 1

## 2019-09-16 NOTE — Consult Note (Signed)
[place] A. Merlene Laughter, MD     [email or web address]          Daniel [name] is an 64 y.o. male.   ASSESSMENT/PLAN: 1.  Acute gait impairment with multiple falls that is most likely multifactorial but acutely the UTI is the most significant etiology.  Other etiologies includes diabetic polyneuropathy, polypharmacy and possibly orthostatic hypotension.  Oversight hypertension evaluation is recommended.  Minimize psychotropic medications.  Physical therapy, follow-up EEG 2.  Obstructive sleep apnea syndrome at baseline which the patient is compliant with CPAP machine 3.  Baseline history of episodic migraine which he is taking Depakote as prophylactic treatment. 4.  Baseline chronic mild gait impairment that is also multifactorial 5.  History of DVT on chronic anticoagulation.    This is a 64 year old white male who presents with recurrent episodes of falling on the day of admission.  The patient reports that his legs will just give out.  He reports no loss of consciousness, tonic-clonic activity or focal neurological deficits.  He does not necessarily reports having dizziness or lightheadedness.  The patient tells me that he had similar complaints a few years ago.  He tells me he was seen by a neurologist in [place].  The chart reviewed and the etiology for the patient's falling and gait impairment appears to have been multifactorial at that time including orthostatic hypotension.  The problem improved after several days.  The patient reports that he has done a lot better with physical therapy while being hospitalized.  The patient was placed on Depakote by the [facility] neurologist for headache prophylaxis.[name]  She has done well with this.  He also has OSA and has been compliant with CPAP.  The patient does not report associated focal numbness, tingling, headaches, dyspnea, shortness of breath or syncope.  The review of systems otherwise negative.     GENERAL: This a  pleasant overweight male who is doing well at this time.  HEENT: He has a large neck and crowded airway.  Tongue is also large.  Neck is supple.  No trauma noted.  ABDOMEN: soft  EXTREMITIES: No edema; there is metatarsal or amputation on the right  BACK: Normal  SKIN: Normal by inspection.    MENTAL STATUS: Alert and oriented. Speech, language and cognition are generally intact. Judgment and insight normal.   CRANIAL NERVES: Pupils are equal, round and reactive to light and accomodation; extra ocular movements are full, there is no significant nystagmus; visual fields are full; upper and lower facial muscles are normal in strength and symmetric, there is no flattening of the nasolabial folds; tongue is midline; uvula is midline; shoulder elevation is normal.  MOTOR: Normal tone, bulk and strength; no pronator drift.  COORDINATION: Left finger to nose is normal, right finger to nose is normal, No rest tremor; no intention tremor; no postural tremor; no bradykinesia.  REFLEXES: Deep tendon reflexes are symmetrical and normal but markedly diminished to absent in the legs requiring augmentation.   SENSATION: Normal to light touch, temperature, and pain.  GAIT: He stands and ambulates without assistance.  Gait is slightly unsteady.       GNA  NOTES OSA Diabetic peripheral neuropathy  Chronic migraine headaches,              Has helped by Depakote ER 500 mg every night, refilled his prescription             Excedrin Migraine as needed  Presyncope, dizziness, falling episodes Multifactorial, this includes poorly controlled  diabetes, dehydration, deconditioning, polypharmacy treatment  I have advised him to continue tight control of his diabetes, document glucose, keep well hydration, counteractive maneuver  for orthostatic blood pressure changes,moderate exercise   Blood pressure 127/66, pulse 86, temperature 98 F (36.7 C), temperature source Oral, resp. rate 20, height 5'  5" (1.651 m), weight 112.5 kg, SpO2 98 %.  Past Medical History:  Diagnosis Date  . ADHD (attention deficit hyperactivity disorder)   . Anxiety   . Chronic kidney disease   . Clotting disorder ([place])   . Depression   . Diabetes mellitus without complication ([place])   . Diabetes mellitus, type II ([place])   . Dizziness 03/17/2015  . Essential hypertension 06/25/2018  . GERD (gastroesophageal reflux disease)   . HIV disease ([place])   . HIV infection ([place])   . Liver disease   . OSA (obstructive sleep apnea) 07/25/2015   Uses CPAP regularly  . Peripheral vascular disease ([place])   . Ulcer     Past Surgical History:  Procedure Laterality Date  . AMPUTATION Right 10/02/2017   Procedure: RIGHT TRANSMETATARSAL AMPUTATION;  Surgeon: Leandrew Koyanagi, MD;  [facility];  Service: Orthopedics;  Laterality: Right;  . SMALL INTESTINE SURGERY    . STOMACH SURGERY    . TOE AMPUTATION Right 08/2016   right great toe    Family History  Problem Relation Age of Onset  . Depression Brother   . Throat cancer Brother        half brother, never smoker  . COPD Mother   . Diabetes Neg Hx     Social History:  reports that he quit smoking about 5 years ago. His smoking use included cigars. He has a 1.00 pack-year smoking history. He has never used smokeless tobacco. He reports that he does not drink alcohol and does not use drugs.  Allergies:  Allergies  Allergen Reactions  . Aspirin Swelling  . Ibuprofen Swelling  . Sustiva [Efavirenz] Swelling and Rash  . Nsaids Other (See Comments)    unknwn  . Lipitor [Atorvastatin Calcium] Other (See Comments)    Leg pain    Medications: Prior to Admission medications   Medication Sig Start Date End Date Taking? Authorizing Provider  abacavir-dolutegravir-lamiVUDine (TRIUMEQ) 600-50-300 MG tablet Take 1 tablet by mouth daily. Patient taking differently: Take 1 tablet by mouth at bedtime.  09/13/19  Yes Golden Circle, FNP  acetaminophen (TYLENOL) 500 MG  tablet Take 1,500 mg by mouth in the morning and at bedtime.    Yes [provider]  ALPRAZolam Duanne Moron) 1 MG tablet Take 1 mg by mouth at bedtime. *May take one tablet up to 4 times daily as needed for anxiety   Yes [provider]  amLODipine (NORVASC) 5 MG tablet Take 1 tablet (5 mg total) by mouth daily. 05/19/19  Yes [name], MD  amphetamine-dextroamphetamine (ADDERALL) 30 MG tablet Take 30 mg by mouth 3 (three) times daily.   Yes [provider]  Brexpiprazole (REXULTI) 1 MG TABS Take 1 mg by mouth at bedtime.   Yes [provider]  canagliflozin (INVOKANA) 100 MG TABS tablet Take 1 tablet (100 mg total) by mouth daily before breakfast. 02/01/19  Yes Renato Shin, MD  diclofenac Sodium (VOLTAREN) 1 % GEL APPLY 2 GRAMS TOPICALLY TO EACH KNEE IN THE MORNING AND AT BEDTIME, APPLY 1 GRAM TO [place] AFTERNOON Patient taking differently: Apply 1-2 g topically See admin instructions. APPLY 2 GRAMS TOPICALLY TO [facility]  KNEE IN THE MORNING AND AT BEDTIME, APPLY 1 GRAM TO EACH KNEE IN THE AFTERNOON 08/25/19  Yes [name], MD  divalproex (DEPAKOTE ER) 500 MG 24 hr tablet TAKE 1 TABLET(500 MG) BY MOUTH AT BEDTIME Patient taking differently: Take 500 mg by mouth at bedtime.  05/06/19  Yes [name], MD  Dulaglutide (TRULICITY) 3 BP/1.0CH SOPN Inject 0.5 mLs (3 mg total) into the skin once a week. Patient taking differently: Inject 3 mg into the skin every Saturday.  08/24/19  Yes Renato Shin, MD  ezetimibe (ZETIA) 10 MG tablet TAKE 1 TABLET(10 MG) BY MOUTH DAILY Patient taking differently: Take 10 mg by mouth at bedtime.  05/19/19  Yes [name], MD  furosemide (LASIX) 40 MG tablet TAKE 1 TABLET(40 MG) BY MOUTH DAILY Patient taking differently: Take 40 mg by mouth daily.  05/19/19  Yes [name], MD  insulin aspart (NOVOLOG FLEXPEN) 100 UNIT/ML FlexPen 15 units with breakfast, and 25 unit with supper Patient taking differently:  Inject 15-25 Units into the skin See admin instructions. 15 units with breakfast, and 25 unit with supper 11/08/18  Yes Renato Shin, MD  insulin glargine (LANTUS SOLOSTAR) 100 UNIT/ML Solostar Pen Inject 130 Units into the skin every morning. Patient taking differently: Inject 150 Units into the skin every morning.  08/24/19  Yes Renato Shin, MD  levothyroxine (SYNTHROID, LEVOTHROID) 50 MCG tablet Take 1 tablet (50 mcg total) by mouth at bedtime. 07/24/15  Yes Daub, Loura Back, MD  ondansetron (ZOFRAN) 8 MG tablet TAKE 1 TABLET(8 MG) BY MOUTH EVERY 8 HOURS AS NEEDED FOR NAUSEA OR VOMITING Patient taking differently: Take 8 mg by mouth every 8 (eight) hours as needed for nausea or vomiting.  08/30/19  Yes Comer, Okey Regal, MD  pantoprazole (PROTONIX) 40 MG tablet TAKE 1 TABLET(40 MG) BY MOUTH DAILY AT 6 AM Patient taking differently: Take 40 mg by mouth every evening.  05/19/19  Yes [name], MD  polyethylene glycol (MIRALAX / GLYCOLAX) packet Take 17 g by mouth 2 (two) times daily. Patient taking differently: Take 17 g by mouth 2 (two) times daily as needed for mild constipation or moderate constipation.  10/05/17  Yes Eugenie Filler, MD  pregabalin (LYRICA) 150 MG capsule Take 2 capsules (300 mg total) by mouth 2 (two) times daily. 05/19/19  Yes [name], MD  protriptyline (VIVACTIL) 10 MG tablet Take 10 mg by mouth 3 (three) times daily.  01/30/16  Yes [provider]  rivaroxaban (XARELTO) 20 MG TABS tablet TAKE 1 TABLET(20 MG) BY MOUTH DAILY Patient taking differently: Take 20 mg by mouth daily with supper.  05/19/19  Yes [name], MD  tiZANidine (ZANAFLEX) 4 MG tablet TAKE 1 TABLET(4 MG) BY MOUTH EVERY 6 HOURS AS NEEDED FOR MUSCLE SPASMS. DO NOT REFILL IN LESS THAN 30 DAYS Patient taking differently: Take 4 mg by mouth every 6 (six) hours as needed for muscle spasms.  09/15/18  Yes Marcial Pacas, MD  TRINTELLIX 20 MG TABS tablet Take 20 mg by mouth at bedtime.  01/26/15   Yes [provider]  TRULICITY 8.52 DP/8.2UM SOPN Inject 0.75 mg into the skin every Saturday. 09/01/19  Yes [provider]  vortioxetine HBr (TRINTELLIX) 5 MG TABS tablet Take 5 mg by mouth at bedtime.   Yes [provider]  zolpidem (AMBIEN) 10 MG tablet Take 10 mg by mouth at bedtime.    Yes [provider]  Continuous Blood  Gluc Sensor (FREESTYLE LIBRE 14 DAY SENSOR) MISC USE AS DIRECTED EVERY 14 DAYS Patient not taking: Reported on 09/14/2019 06/06/19   Renato Shin, MD  Insulin Pen Needle (B-D ULTRAFINE III SHORT PEN) 31G X 8 MM MISC 1 each by Other route 3 (three) times daily. E11.9 Patient not taking: Reported on 09/14/2019 08/30/19   Renato Shin, MD    Scheduled Meds: . abacavir-dolutegravir-lamiVUDine  1 tablet Oral QHS  . brexpiprazole  1 mg Oral QHS  . diclofenac Sodium  2 g Topical TID  . divalproex  500 mg Oral QHS  . docusate sodium  100 mg Oral BID  . ezetimibe  10 mg Oral QHS  . furosemide  40 mg Oral Daily  . insulin aspart  0-5 Units Subcutaneous QHS  . insulin aspart  8 Units Subcutaneous TID WC  . insulin glargine  130 Units Subcutaneous Daily  . levothyroxine  25 mcg Oral QHS  . pantoprazole  40 mg Oral Q0600  . pregabalin  100 mg Oral BID  . rivaroxaban  20 mg Oral Q supper  . vortioxetine HBr  20 mg Oral QHS   Continuous Infusions: . cefTRIAXone (ROCEPHIN)  IV 1 g (09/15/19 2050)   PRN Meds:.acetaminophen **OR** acetaminophen, ALPRAZolam, ondansetron **OR** ondansetron (ZOFRAN) IV, zolpidem     Results for orders placed or performed during the hospital encounter of 09/14/19 (from the past 48 hour(s))  Rapid urine drug screen (hospital performed)     Status: Abnormal   Collection Time: 09/14/19  3:35 PM  Result Value Ref Range   Opiates NONE DETECTED NONE DETECTED   Cocaine NONE DETECTED NONE DETECTED   Benzodiazepines POSITIVE (A) NONE DETECTED   Amphetamines POSITIVE (A) NONE DETECTED   Tetrahydrocannabinol NONE  DETECTED NONE DETECTED   Barbiturates NONE DETECTED NONE DETECTED    Comment: (NOTE) DRUG SCREEN FOR MEDICAL PURPOSES ONLY.  IF CONFIRMATION IS NEEDED FOR ANY PURPOSE, NOTIFY LAB WITHIN 5 DAYS.  LOWEST DETECTABLE LIMITS FOR URINE DRUG SCREEN Drug Class                     Cutoff (ng/mL) Amphetamine and metabolites    1000 Barbiturate and metabolites    200 Benzodiazepine                 696 Tricyclics and metabolites     300 Opiates and metabolites        300 Cocaine and metabolites        300 THC                            50 Performed at [facility], [address], [place], [place] [address]   Urinalysis, Routine w reflex microscopic     Status: Abnormal   Collection Time: 09/14/19  3:35 PM  Result Value Ref Range   Color, Urine YELLOW YELLOW   APPearance HAZY (A) CLEAR   Specific Gravity, Urine 1.027 1.005 - 1.030   pH 5.0 5.0 - 8.0   Glucose, UA >=500 (A) NEGATIVE mg/dL   Hgb urine dipstick MODERATE (A) NEGATIVE   Bilirubin Urine NEGATIVE NEGATIVE   Ketones, ur NEGATIVE NEGATIVE mg/dL   Protein, ur 30 (A) NEGATIVE mg/dL   Nitrite NEGATIVE NEGATIVE   Leukocytes,Ua SMALL (A) NEGATIVE   RBC / HPF 0-5 0 - 5 RBC/hpf   WBC, UA 21-50 0 - 5 WBC/hpf   Bacteria, UA FEW (A) NONE SEEN   WBC  Clumps PRESENT     Comment: Performed at [facility], [address] [place], [place] [address]  CBC with Differential/Platelet     Status: Abnormal   Collection Time: 09/14/19  3:54 PM  Result Value Ref Range   WBC 6.0 4.0 - 10.5 K/uL   RBC 4.86 4.22 - 5.81 MIL/uL   Hemoglobin 16.0 13.0 - 17.0 g/dL   HCT 46.1 39 - 52 %   MCV 94.9 80.0 - 100.0 fL   MCH 32.9 26.0 - 34.0 pg   MCHC 34.7 30.0 - 36.0 g/dL   RDW 13.5 11.5 - 15.5 %   Platelets 130 (L) 150 - 400 K/uL   nRBC 0.0 0.0 - 0.2 %   Neutrophils Relative % 53 %   Neutro Abs 3.2 1.7 - 7.7 K/uL   Lymphocytes Relative 33 %   Lymphs Abs 2.0 0.7 - 4.0 K/uL   Monocytes Relative 12 %   Monocytes Absolute 0.7 0 - 1 K/uL   Eosinophils  Relative 2 %   Eosinophils Absolute 0.1 0 - 0 K/uL   Basophils Relative 0 %   Basophils Absolute 0.0 0 - 0 K/uL   Immature Granulocytes 0 %   Abs Immature Granulocytes 0.02 0.00 - 0.07 K/uL    Comment: Performed at [facility], [address], [place], [place] [address]  Comprehensive metabolic panel     Status: Abnormal   Collection Time: 09/14/19  3:54 PM  Result Value Ref Range   Sodium 138 135 - 145 mmol/L   Potassium 3.6 3.5 - 5.1 mmol/L   Chloride 102 98 - 111 mmol/L   CO2 26 22 - 32 mmol/L   Glucose, Bld 188 (H) 70 - 99 mg/dL    Comment: Glucose reference range applies only to samples taken after fasting for at least 8 hours.   BUN 28 (H) 8 - 23 mg/dL   Creatinine, Ser 1.56 (H) 0.61 - 1.24 mg/dL   Calcium 9.2 8.9 - 10.3 mg/dL   Total Protein 7.2 6.5 - 8.1 g/dL   Albumin 3.7 3.5 - 5.0 g/dL   AST 24 15 - 41 U/L   ALT 25 0 - 44 U/L   Alkaline Phosphatase 85 38 - 126 U/L   Total Bilirubin 0.7 0.3 - 1.2 mg/dL   GFR calc non Af Amer 46 (L) >60 mL/min   GFR calc Af Amer 53 (L) >60 mL/min   Anion gap 10 5 - 15    Comment: Performed at [facility], [address] [place], [place] [address]  Protime-INR     Status: Abnormal   Collection Time: 09/14/19  3:54 PM  Result Value Ref Range   Prothrombin Time 17.0 (H) 11.4 - 15.2 seconds   INR 1.4 (H) 0.8 - 1.2    Comment: (NOTE) INR goal varies based on device and disease states. Performed at [facility], [address] [place], [place] [address]   Ammonia     Status: None   Collection Time: 09/14/19  3:54 PM  Result Value Ref Range   Ammonia 30 9 - 35 umol/L    Comment: Performed at [facility], [address] [place], [place] [address]  Valproic acid level     Status: Abnormal   Collection Time: 09/14/19  3:54 PM  Result Value Ref Range   Valproic Acid Lvl 20 (L) 50.0 - 100.0 ug/mL    Comment: Performed at [facility], [address] [place], [place] [address]  TSH  Status: Abnormal   Collection Time: 09/14/19  3:54  PM  Result Value Ref Range   TSH 0.108 (L) 0.350 - 4.500 uIU/mL    Comment: Performed by a 3rd Generation assay with a functional sensitivity of <=0.01 uIU/mL. Performed at [facility], [address] [place], [place] [address]   Troponin I (High Sensitivity)     Status: None   Collection Time: 09/14/19  3:54 PM  Result Value Ref Range   Troponin I (High Sensitivity) 4 <18 ng/L    Comment: (NOTE) Elevated high sensitivity troponin I (hsTnI) values and significant  changes across serial measurements may suggest ACS but many other  chronic and acute conditions are known to elevate hsTnI results.  Refer to the "Links" section for chest pain algorithms and additional  guidance. Performed at [facility], [address], [place], [place] [address]   CK     Status: Abnormal   Collection Time: 09/14/19  3:54 PM  Result Value Ref Range   Total CK 541 (H) 49.0 - 397.0 U/L    Comment: Performed at [facility], [address] [place], [place] [address]  Ethanol     Status: None   Collection Time: 09/14/19  3:59 PM  Result Value Ref Range   Alcohol, Ethyl (B) <10 <10 mg/dL    Comment: (NOTE) Lowest detectable limit for serum alcohol is 10 mg/dL.  For medical purposes only. Performed at [facility], [address], [place], [place] [address]   Brain natriuretic peptide     Status: None   Collection Time: 09/14/19  3:59 PM  Result Value Ref Range   B Natriuretic Peptide 67.0 0.0 - 100.0 pg/mL    Comment: Performed at [facility], [address] [place], [place] [address]  Blood gas, arterial     Status: Abnormal   Collection Time: 09/14/19  4:05 PM  Result Value Ref Range   FIO2 21.00    pH, Arterial 7.368 7.35 - 7.45   pCO2 arterial 49.9 (H) 32 - 48 mmHg   pO2, Arterial 70.3 (L) 83 - 108 mmHg   Bicarbonate 26.2 20.0 - 28.0 mmol/L   Acid-Base Excess 3.2 (H) 0.0 - 2.0 mmol/L   O2 Saturation 94.5 %   Patient temperature 36.5    Allens test (pass/fail) PASS PASS    Comment:  Performed at [facility], [address], [place], [place] [address]  CBG monitoring, ED     Status: Abnormal   Collection Time: 09/14/19  4:06 PM  Result Value Ref Range   Glucose-Capillary 167 (H) 70 - 99 mg/dL    Comment: Glucose reference range applies only to samples taken after fasting for at least 8 hours.  SARS Coronavirus 2 by RT PCR (hospital order, performed in [facility] hospital lab) Nasopharyngeal Urine, Catheterized     Status: None   Collection Time: 09/14/19  8:06 PM   Specimen: Urine, Catheterized; Nasopharyngeal  Result Value Ref Range   SARS Coronavirus 2 NEGATIVE NEGATIVE    Comment: (NOTE) SARS-CoV-2 target nucleic acids are NOT DETECTED.  The SARS-CoV-2 RNA is generally detectable in upper and lower respiratory specimens during the acute phase of infection. The lowest concentration of SARS-CoV-2 viral copies this assay can detect is 250 copies / mL. A negative result does not preclude SARS-CoV-2 infection and should not be used as the sole basis for treatment or other patient management decisions.  A negative result may occur with improper specimen collection / handling, submission of specimen other  than nasopharyngeal swab, presence of viral mutation(s) within the areas targeted by this assay, and inadequate number of viral copies (<250 copies / mL). A negative result must be combined with clinical observations, patient history, and epidemiological information.  Fact Sheet for Patients:   StrictlyIdeas.no  Fact Sheet for Healthcare Providers: BankingDealers.co.za  This test is not yet approved or  cleared by the [place] FDA and has been authorized for detection and/or diagnosis of SARS-CoV-2 by FDA under an Emergency Use Authorization (EUA).  This EUA will remain in effect (meaning this test can be used) for the duration of the COVID-19 declaration under Section 564(b)(1) of the Act, 21 U.S.C. section  360bbb-3(b)(1), unless the authorization is terminated or revoked sooner.  Performed at [facility], [address], [place], [place] [address]   Glucose, capillary     Status: Abnormal   Collection Time: 09/14/19  9:39 PM  Result Value Ref Range   Glucose-Capillary 133 (H) 70 - 99 mg/dL    Comment: Glucose reference range applies only to samples taken after fasting for at least 8 hours.  Comprehensive metabolic panel     Status: Abnormal   Collection Time: 09/15/19  4:15 AM  Result Value Ref Range   Sodium 142 135 - 145 mmol/L   Potassium 4.0 3.5 - 5.1 mmol/L   Chloride 105 98 - 111 mmol/L   CO2 27 22 - 32 mmol/L   Glucose, Bld 149 (H) 70 - 99 mg/dL    Comment: Glucose reference range applies only to samples taken after fasting for at least 8 hours.   BUN 25 (H) 8 - 23 mg/dL   Creatinine, Ser 1.42 (H) 0.61 - 1.24 mg/dL   Calcium 9.0 8.9 - 10.3 mg/dL   Total Protein 6.7 6.5 - 8.1 g/dL   Albumin 3.5 3.5 - 5.0 g/dL   AST 21 15 - 41 U/L   ALT 24 0 - 44 U/L   Alkaline Phosphatase 78 38 - 126 U/L   Total Bilirubin 0.7 0.3 - 1.2 mg/dL   GFR calc non Af Amer 51 (L) >60 mL/min   GFR calc Af Amer 59 (L) >60 mL/min   Anion gap 10 5 - 15    Comment: Performed at [facility], [address], [place], [place] [address]  Magnesium     Status: None   Collection Time: 09/15/19  4:15 AM  Result Value Ref Range   Magnesium 2.3 1.7 - 2.4 mg/dL    Comment: Performed at [facility], [address], [place],  [address]  T4, free     Status: None   Collection Time: 09/15/19  4:15 AM  Result Value Ref Range   Free T4 1.04 0.61 - 1.12 ng/dL    Comment: (NOTE) Biotin ingestion may interfere with free T4 tests. If the results are inconsistent with the TSH level, previous test results, or the clinical presentation, then consider biotin interference. If needed, order repeat testing after stopping biotin. Performed at [facility], [place] [address], [place] [address]   CBC      Status: Abnormal   Collection Time: 09/15/19  4:15 AM  Result Value Ref Range   WBC 6.0 4.0 - 10.5 K/uL   RBC 4.91 4.22 - 5.81 MIL/uL   Hemoglobin 15.9 13.0 - 17.0 g/dL   HCT 46.8 39 - 52 %   MCV 95.3 80.0 - 100.0 fL   MCH 32.4 26.0 - 34.0 pg   MCHC 34.0 30.0 -  36.0 g/dL   RDW 13.6 11.5 - 15.5 %   Platelets 125 (L) 150 - 400 K/uL   nRBC 0.0 0.0 - 0.2 %    Comment: Performed at [facility], [address], [place], [place] [address]  Glucose, capillary     Status: Abnormal   Collection Time: 09/15/19  7:44 AM  Result Value Ref Range   Glucose-Capillary 141 (H) 70 - 99 mg/dL    Comment: Glucose reference range applies only to samples taken after fasting for at least 8 hours.  Glucose, capillary     Status: Abnormal   Collection Time: 09/15/19 11:25 AM  Result Value Ref Range   Glucose-Capillary 143 (H) 70 - 99 mg/dL    Comment: Glucose reference range applies only to samples taken after fasting for at least 8 hours.  Vitamin B12     Status: None   Collection Time: 09/15/19  4:30 PM  Result Value Ref Range   Vitamin B-12 424 180 - 914 pg/mL    Comment: (NOTE) This assay is not validated for testing neonatal or myeloproliferative syndrome specimens for Vitamin B12 levels. Performed at [facility], [address] [place], [place] [address]   Glucose, capillary     Status: None   Collection Time: 09/15/19  5:27 PM  Result Value Ref Range   Glucose-Capillary 84 70 - 99 mg/dL    Comment: Glucose reference range applies only to samples taken after fasting for at least 8 hours.  Glucose, capillary     Status: Abnormal   Collection Time: 09/15/19  9:48 PM  Result Value Ref Range   Glucose-Capillary 151 (H) 70 - 99 mg/dL    Comment: Glucose reference range applies only to samples taken after fasting for at least 8 hours.   *Note: Due to a large number of results and/or encounters for the requested time period, some results have not been displayed. A complete set of results can be  found in Results Review.    Studies/Results:  HEAD CT FINDINGS: Brain: No evidence of acute infarction, hemorrhage, hydrocephalus, extra-axial collection or mass lesion/mass effect.  Vascular: No hyperdense vessel or unexpected calcification.  Skull: Normal. Negative for fracture or focal lesion.  Sinuses/Orbits: No acute finding.  Other: None.  IMPRESSION: No evidence for acute intracranial abnormality.   Daniel [name] A. Merlene Laughter, M.D.  Diplomate, Tax adviser of Psychiatry and Neurology ( Neurology). 09/16/2019, 7:30 AM

## 2019-09-16 NOTE — Procedures (Signed)
  [place] A. Merlene Laughter, MD     [email or web address]           HISTORY: This is a 64 year old who presents with episode suspicious for seizures.  MEDICATIONS:  Current Facility-Administered Medications:  .  abacavir-dolutegravir-lamiVUDine (TRIUMEQ) 600-50-300 MG per tablet 1 tablet, 1 tablet, Oral, QHS, Zierle-Ghosh, Asia B, DO, 1 tablet at 09/15/19 2134 .  acetaminophen (TYLENOL) tablet 650 mg, 650 mg, Oral, Q6H PRN, 650 mg at 09/16/19 1037 **OR** acetaminophen (TYLENOL) suppository 650 mg, 650 mg, Rectal, Q6H PRN, Zierle-Ghosh, Asia B, DO .  ALPRAZolam (XANAX) tablet 1 mg, 1 mg, Oral, Q6H PRN, Barton Dubois, MD, 1 mg at 09/16/19 1036 .  brexpiprazole (REXULTI) tablet 1 mg, 1 mg, Oral, QHS, Zierle-Ghosh, Asia B, DO, 1 mg at 09/15/19 2142 .  cefTRIAXone (ROCEPHIN) 1 g in sodium chloride 0.9 % 100 mL IVPB, 1 g, Intravenous, Q24H, Zierle-Ghosh, Asia B, DO, Last Rate: 200 mL/hr at 09/15/19 2050, 1 g at 09/15/19 2050 .  diclofenac Sodium (VOLTAREN) 1 % topical gel 2 g, 2 g, Topical, TID, Barton Dubois, MD, 2 g at 09/16/19 1029 .  divalproex (DEPAKOTE ER) 24 hr tablet 500 mg, 500 mg, Oral, QHS, Zierle-Ghosh, Asia B, DO, 500 mg at 09/15/19 2133 .  docusate sodium (COLACE) capsule 100 mg, 100 mg, Oral, BID, Zierle-Ghosh, Asia B, DO, 100 mg at 09/16/19 1029 .  ezetimibe (ZETIA) tablet 10 mg, 10 mg, Oral, QHS, Zierle-Ghosh, Asia B, DO, 10 mg at 09/15/19 2134 .  furosemide (LASIX) tablet 40 mg, 40 mg, Oral, Daily, Zierle-Ghosh, Asia B, DO, 40 mg at 09/16/19 1029 .  insulin aspart (novoLOG) injection 0-5 Units, 0-5 Units, Subcutaneous, QHS, Zierle-Ghosh, Asia B, DO .  insulin aspart (novoLOG) injection 8 Units, 8 Units, Subcutaneous, TID WC, Barton Dubois, MD .  insulin glargine (LANTUS) injection 130 Units, 130 Units, Subcutaneous, Daily, Zierle-Ghosh, Asia B, DO, 130 Units at 09/15/19 1043 .  levothyroxine (SYNTHROID) tablet 25 mcg, 25 mcg, Oral, QHS, Zierle-Ghosh, Asia B, DO,  25 mcg at 09/15/19 2133 .  ondansetron (ZOFRAN) tablet 4 mg, 4 mg, Oral, Q6H PRN **OR** ondansetron (ZOFRAN) injection 4 mg, 4 mg, Intravenous, Q6H PRN, Zierle-Ghosh, Asia B, DO .  pantoprazole (PROTONIX) EC tablet 40 mg, 40 mg, Oral, Q0600, Zierle-Ghosh, Asia B, DO .  pregabalin (LYRICA) capsule 100 mg, 100 mg, Oral, BID, Zierle-Ghosh, Asia B, DO, 100 mg at 09/16/19 1029 .  rivaroxaban (XARELTO) tablet 20 mg, 20 mg, Oral, Q supper, Zierle-Ghosh, Asia B, DO, 20 mg at 09/15/19 1756 .  vortioxetine HBr (TRINTELLIX) tablet 20 mg, 20 mg, Oral, QHS, Zierle-Ghosh, Asia B, DO, 20 mg at 09/15/19 2143 .  zolpidem (AMBIEN) tablet 5 mg, 5 mg, Oral, QHS PRN, Zierle-Ghosh, Asia B, DO, 5 mg at 09/14/19 2320     ANALYSIS: A 16 channel recording using standard 10 20 measurements is conducted for 23 minutes.  There is a well-formed posterior dominant rhythm of 7 Hz which attenuates with eye opening.  There is beta activity observed in frontal areas.  The recording transition to generalized theta slowing indicating drowsiness.  Photic stimulation is carried out without abnormal changes in the background activity.  There is no focal slowing noted.  No epileptiform activities are noted.   IMPRESSION: 1.  This recording shows mild global slowing indicating a mild global encephalopathy.  However, no epileptiform activities are noted.      Pepper Kerrick A. Merlene Laughter, M.D.  Diplomate, Tax adviser of Psychiatry and Neurology ( Neurology).

## 2019-09-16 NOTE — TOC Transition Note (Signed)
Transition of Care [facility]) - CM/SW Discharge Note   Patient Details  Name: [name] MRN: [id number] Date of Birth: [birth year]  [facility] of [facility]) CM/SW Contact:  Salome Arnt, LCSW Phone Number: 09/16/2019, 3:14 PM   Clinical Narrative:   Pt d/c today. No needs reported.     Final next level of care: Home/Self Care Barriers to Discharge: Barriers Resolved   Patient Goals and CMS Choice Patient states their goals for this hospitalization and ongoing recovery are:: to go home. CMS Medicare.gov Compare Post Acute Care list provided to:: Patient    Discharge Placement                       Discharge Plan and Services                                     Social Determinants of Health (SDOH) Interventions     Readmission Risk Interventions No flowsheet data found.

## 2019-09-16 NOTE — Progress Notes (Signed)
EEG Completed; Results Pending  

## 2019-09-16 NOTE — Discharge Summary (Signed)
Physician Discharge Summary  Daniel [name] Daniel [name] TKZ:[id number] DOB: 1955/08/19 DOA: 09/14/2019  PCP: Daniel Agreste, MD  Admit date: 09/14/2019 Discharge date: 09/16/2019  Time spent: 35 minutes  Recommendations for Outpatient Follow-up:  1. Repeat basic metabolic panel to evaluate lites and renal function 2. Repeat thyroid panel in 6-8 weeks to follow the need on further adjustment to Synthroid. 3. Close monitoring to patient CBGs/A1c with further adjustment to hypoglycemic regimen as required. 4. Make sure patient has follow-up with psychiatry service to further adjust/clean as much as possible may use of chronic psychotropic medications as recommended by neurology service.   Discharge Diagnoses:  Active Problems:   Urinary tract infection without hematuria   Obesity, Class III, BMI 40-49.9 (morbid obesity) ([name])   Generalized weakness   Acute encephalopathy HIV infection Hypertension Type 2 diabetes chronically on insulin Depression/anxiety Acute kidney injury on chronic kidney disease stage IIIa at baseline. Gastroesophageal reflux disease Hypothyroidism  Discharge Condition: Stable and improved.  Patient discharged home with instruction to follow-up with PCP and with infectious disease service as an outpatient.  CODE STATUS: Full code.  Diet recommendation: Heart healthy, modified carbohydrate and low calorie diet.  Filed Weights   09/14/19 1452 09/14/19 2100  Weight: 115 kg 112.5 kg    History of present illness:   As per H&P written by Dr. Clearence Ped on 09/14/19 [name] a64 y.o.male,with history of peripheral vascular disease, sleep apnea, liver disease, HIV, GERD, hypertension, diabetes mellitus type 2, depression, anxiety, history of DVTs and PE, chronic kidney disease, and ADHD presents to the [name] with a chief complaint of multiple falls. Patient reports that he has had 5-6 falls over the last 24 hours. He reports that he does not trip and fall,  does not have chest pain prior to fall, does not have palpitations, does not have shortness of breath, does not have headache, does not feel lightheaded. He reports it is just "boom" and he is on the ground. He has not lost consciousness. He has not hit his head. When asked if his legs just give out, patient reports "it'ssomething like that."No provocative factors, nothing is made it better. Patient reports that with the first several falls he was able to get himself up off the floor. With his last fall he was down on the ground for an unknown period of time. Patient's sense of time is not adequate for revealing how long he was down, as he reports that he fell at 1 PM and was picked up by EMS at 12:45 PM. In any event, his aunt's home health aide arrived to find him on the floor and was not able to get him up she called EMS. Patient reports no associated symptoms. He reports that he has had episode like this before approximately 2 years ago. Chart review reveals that he was in the ER in August 2019 for generalized weakness. At that time he was having trouble walking on his right foot after a transmetatarsal amputation secondary to osteomyelitis. Patient was also somnolent but arousable and followed commands without difficulty. Patient was able to stand on his own at that visit and was ultimately discharged home. Chart review also reveals an office visit for right-sided sciatic pain earlier this month.He was referred to Ortho and it does not appear that that appointment has been completed yet.  In the ER: Temp 98.7, blood pressure 140/76, O2 sat 98%, heart rate 81, respiratory rate 19 No leukocytosis, no anemia Electrolytes are stable Creatinine is at baseline Hyperglycemic  at 188 TSH is low at 0.108-levothyroxine reduced at admission to 25 mcg Valproic acid level is low at 20-patient reports that he is compliant with his medication Ammonia is normal at 30 CPK is 541 Troponin is  4 Alcohol level is not detectable UA is positive for UTI-21-50 white blood cells small leukocytes patient admits to occasional dysuria Urine drug screen is positive for amphetamines and benzos-patient is prescribed both CT head shows no evidence of acute intracranial abnormality Chest x-ray shows cardiomegaly with mild vascular congestion no consolidation, effusion, or pneumothorax Admission requested for acute metabolic encephalopathy, generalized weakness, multiple falls.  [name] Course:  1-generalized weakness and fall -Multifactorial: In the setting of UTI, questionable seizure disorder and polypharmacy -Advised to maintain adequate hydration and nutrition. -Complete treatment for UTI as mentioned below. -Continue minimizing medications that can affect his sensorium. -Normal B12 level and negative EEG. -Neurology has been consulted and appreciate recommendations; felt the patient's symptoms are multifactorial but most likely triggered by UTI.  Will complete management with antibiotics and suggest close monitoring the patient's psychotropic medications and polypharmacy.  There is a high contribution in his impaired gait from diabetic neuropathy, continue the use of Lyrica for that.  2-hypothyroidism -TSH low at 0.108 -Synthroid has been reduced to 25 mcg daily. -Please repeat thyroid panel in 6-8 weeks.  3-UTI -Empirically treated with Rocephin while inpatient. -Final culture results pending at time of discharge -Patient has been sent home on cefdinir 300 mg twice a day for 5 more days to complete management. -Currently denying dysuria.  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 -No signs of overt bleeding.  5-type 2 diabetes mellitus with nephropathy -Modified carbohydrate diet has been encouraged. -Resume home hypoglycemic regimen -Continue close monitoring of patient's CBGs/A1c as an outpatient. -Further adjustment to be  done as needed based on his blood sugar fluctuation.  6-acute kidney injury on chronic kidney disease a stage IIIa -In the setting of dehydration/prerenal azotemia. -Continue to minimize the use of nephrotoxic agents. -After fluid resuscitation and treatment for UTI renal function back to baseline. -Advised to maintain adequate hydration -Repeat basic metabolic panel follow-up visit to reassess renal function and stability.  7-history of HIV -Continue home antiviral regimen -Continue outpatient follow-up with infectious disease service.  8-history of gastroesophageal reflux disease -Continue PPI.  9-anxiety/depression -Resume home anxiolytic/antidepressant regimen. -As per neurology recommendations if his symptoms of falling/gait impairment persist recommendations have been given to decrease psychotropic medications. -Would recommend outpatient follow-up with psychiatry service.  10-morbid obesity -Low calorie diet, portion control and increase physical activity discussed with patient. -Body mass index is 41.27 kg/m.  Procedures:  EEG: No epileptic waveforms appreciated.  See below for x-ray reports.  Consultations:  Neurology service.  Discharge Exam: Vitals:   09/15/19 2147 09/15/19 2232  BP: 127/66   Pulse: 86   Resp: 20   Temp:    SpO2: 98% 98%    General: No chest pain, no nausea, no vomiting, reports no dysuria currently.  Patient is oriented x3 and in no acute distress. Cardiovascular: S1-S2, no rubs, no gallops, no JVD. Respiratory: Good air movement bilaterally, no wheezing, no crackles. Abdomen: Soft, nontender, distended, positive bowel sounds Extremities: No cyanosis or clubbing.  Trace edema appreciated bilaterally.  Discharge Instructions   Discharge Instructions    Diet - low sodium heart healthy   Complete by: As directed    Discharge instructions   Complete by: As directed  Take medications as prescribed Maintain adequate  hydration Complete antibiotic therapy as instructed Follow-up with PCP in 10 days     Allergies as of 09/16/2019      Reactions   Aspirin Swelling   Ibuprofen Swelling   Sustiva [efavirenz] Swelling, Rash   Nsaids Other (See Comments)   unknwn   Lipitor [atorvastatin Calcium] Other (See Comments)   Leg pain      Medication List    STOP taking these medications   amLODipine 5 MG tablet Commonly known as: NORVASC   tiZANidine 4 MG tablet Commonly known as: ZANAFLEX     TAKE these medications   acetaminophen 500 MG tablet Commonly known as: TYLENOL Take 1,500 mg by mouth in the morning and at bedtime.   Adderall 30 MG tablet Generic drug: amphetamine-dextroamphetamine Take 30 mg by mouth 3 (three) times daily.   ALPRAZolam 1 MG tablet Commonly known as: XANAX Take 1 mg by mouth at bedtime. *May take one tablet up to 4 times daily as needed for anxiety   Ambien 10 MG tablet Generic drug: zolpidem Take 10 mg by mouth at bedtime.   B-D ULTRAFINE III SHORT PEN 31G X 8 MM Misc Generic drug: Insulin Pen Needle 1 each by Other route 3 (three) times daily. E11.9   canagliflozin 100 MG Tabs tablet Commonly known as: Invokana Take 1 tablet (100 mg total) by mouth daily before breakfast.   cefdinir 300 MG capsule Commonly known as: OMNICEF Take 1 capsule (300 mg total) by mouth 2 (two) times daily for 5 days.   diclofenac Sodium 1 % Gel Commonly known as: VOLTAREN APPLY 2 GRAMS TOPICALLY TO EACH KNEE IN THE MORNING AND AT BEDTIME, APPLY 1 GRAM TO EACH KNEE IN THE AFTERNOON What changed:   how much to take  how to take this  when to take this   divalproex 500 MG 24 hr tablet Commonly known as: DEPAKOTE ER TAKE 1 TABLET(500 MG) BY MOUTH AT BEDTIME What changed: See the new instructions.   ezetimibe 10 MG tablet Commonly known as: ZETIA TAKE 1 TABLET(10 MG) BY MOUTH DAILY What changed:   how much to take  how to take this  when to take this  additional  instructions   FreeStyle Libre 14 Day Sensor Misc USE AS DIRECTED EVERY 14 DAYS   furosemide 40 MG tablet Commonly known as: LASIX TAKE 1 TABLET(40 MG) BY MOUTH DAILY What changed:   how much to take  how to take this  when to take this  additional instructions   Lantus SoloStar 100 UNIT/ML Solostar Pen Generic drug: insulin glargine Inject 130 Units into the skin every morning. What changed:   how much to take  when to take this   levothyroxine 50 MCG tablet Commonly known as: SYNTHROID Take 1 tablet (50 mcg total) by mouth at bedtime.   NovoLOG FlexPen 100 UNIT/ML FlexPen Generic drug: insulin aspart 15 units with breakfast, and 25 unit with supper What changed:   how much to take  how to take this  when to take this   ondansetron 8 MG tablet Commonly known as: ZOFRAN TAKE 1 TABLET(8 MG) BY MOUTH EVERY 8 HOURS AS NEEDED FOR NAUSEA OR VOMITING What changed: See the new instructions.   pantoprazole 40 MG tablet Commonly known as: PROTONIX TAKE 1 TABLET(40 MG) BY MOUTH DAILY AT 6 AM What changed:   how much to take  how to take this  when to take this  additional instructions  polyethylene glycol 17 g packet Commonly known as: MIRALAX / GLYCOLAX Take 17 g by mouth 2 (two) times daily. What changed:   when to take this  reasons to take this   pregabalin 150 MG capsule Commonly known as: LYRICA Take 2 capsules (300 mg total) by mouth 2 (two) times daily.   protriptyline 10 MG tablet Commonly known as: VIVACTIL Take 10 mg by mouth 3 (three) times daily.   Rexulti 1 MG Tabs tablet Generic drug: brexpiprazole Take 1 mg by mouth at bedtime.   rivaroxaban 20 MG Tabs tablet Commonly known as: Xarelto TAKE 1 TABLET(20 MG) BY MOUTH DAILY What changed:   how much to take  how to take this  when to take this  additional instructions   Trintellix 20 MG Tabs tablet Generic drug: vortioxetine HBr Take 20 mg by mouth at bedtime. What  changed: Another medication with the same name was removed. Continue taking this medication, and follow the directions you see here.   Triumeq 600-50-300 MG tablet Generic drug: abacavir-dolutegravir-lamiVUDine Take 1 tablet by mouth daily. What changed: when to take this   Trulicity 3 GU/4.4IH Sopn Generic drug: Dulaglutide Inject 0.5 mLs (3 mg total) into the skin once a week. What changed:   when to take this  Another medication with the same name was removed. Continue taking this medication, and follow the directions you see here.      Allergies  Allergen Reactions  . Aspirin Swelling  . Ibuprofen Swelling  . Sustiva [Efavirenz] Swelling and Rash  . Nsaids Other (See Comments)    unknwn  . Lipitor [Atorvastatin Calcium] Other (See Comments)    Leg pain    Follow-up Information    Daniel Agreste, MD Follow up in 10 day(s).   Specialties: Family Medicine, Sports Medicine Contact information: [place] [address] [phone]        Thayer Headings, MD .   Specialty: Infectious Diseases Contact information: [address] [address] [phone]        Skeet Latch, MD .   Specialty: Cardiology Contact information: [address] [place] [address] [phone]        [name], MD .   Specialty: Gastroenterology Contact information: [phone] [address] [place] [place] [address] [phone]               The results of significant diagnostics from this hospitalization (including imaging, microbiology, ancillary and laboratory) are listed below for reference.    Significant Diagnostic Studies: CT Head Wo Contrast  Result Date: 09/14/2019 CLINICAL DATA:  Mental status changes, unknown cause. Generalized weakness and dizziness. Several falls today. EXAM: CT HEAD WITHOUT CONTRAST TECHNIQUE: Contiguous axial images were obtained from the base of the skull through the vertex  without intravenous contrast. COMPARISON:  MRI 09/24/2017 and report of CT of the head on 09/28/2017, CT of the head on 06/28/2016 FINDINGS: Brain: No evidence of acute infarction, hemorrhage, hydrocephalus, extra-axial collection or mass lesion/mass effect. Vascular: No hyperdense vessel or unexpected calcification. Skull: Normal. Negative for fracture or focal lesion. Sinuses/Orbits: No acute finding. Other: None. IMPRESSION: No evidence for acute intracranial abnormality. Electronically Signed   By: Nolon Nations M.D.   On: 09/14/2019 17:53   DG Chest Port 1 View  Result Date: 09/14/2019 CLINICAL DATA:  64 year old male with altered mental status. EXAM: PORTABLE CHEST 1 VIEW COMPARISON:  Chest radiograph dated 10/16/2017. FINDINGS: There is cardiomegaly with mild  vascular congestion. No focal consolidation, pleural effusion, pneumothorax. No acute osseous pathology. IMPRESSION: Cardiomegaly with mild vascular congestion. No focal consolidation. Electronically Signed   By: Anner Crete M.D.   On: 09/14/2019 16:06   EEG adult  Result Date: 09/16/2019 Phillips Odor, MD     09/16/2019  3:06 PM [place] A. Merlene Laughter, MD     [email or web address]       HISTORY: This is a 64 year old who presents with episode suspicious for seizures. MEDICATIONS: Current Facility-Administered Medications: .  abacavir-dolutegravir-lamiVUDine (TRIUMEQ) 600-50-300 MG per tablet 1 tablet, 1 tablet, Oral, QHS, Zierle-Ghosh, Asia B, DO, 1 tablet at 09/15/19 2134 .  acetaminophen (TYLENOL) tablet 650 mg, 650 mg, Oral, Q6H PRN, 650 mg at 09/16/19 1037 **OR** acetaminophen (TYLENOL) suppository 650 mg, 650 mg, Rectal, Q6H PRN, Zierle-Ghosh, Asia B, DO .  ALPRAZolam (XANAX) tablet 1 mg, 1 mg, Oral, Q6H PRN, Barton Dubois, MD, 1 mg at 09/16/19 1036 .  brexpiprazole (REXULTI) tablet 1 mg, 1 mg, Oral, QHS, Zierle-Ghosh, Asia B, DO, 1 mg at 09/15/19 2142 .  cefTRIAXone (ROCEPHIN) 1 g in sodium chloride 0.9 % 100 mL  IVPB, 1 g, Intravenous, Q24H, Zierle-Ghosh, Asia B, DO, Last Rate: 200 mL/hr at 09/15/19 2050, 1 g at 09/15/19 2050 .  diclofenac Sodium (VOLTAREN) 1 % topical gel 2 g, 2 g, Topical, TID, Barton Dubois, MD, 2 g at 09/16/19 1029 .  divalproex (DEPAKOTE ER) 24 hr tablet 500 mg, 500 mg, Oral, QHS, Zierle-Ghosh, Asia B, DO, 500 mg at 09/15/19 2133 .  docusate sodium (COLACE) capsule 100 mg, 100 mg, Oral, BID, Zierle-Ghosh, Asia B, DO, 100 mg at 09/16/19 1029 .  ezetimibe (ZETIA) tablet 10 mg, 10 mg, Oral, QHS, Zierle-Ghosh, Asia B, DO, 10 mg at 09/15/19 2134 .  furosemide (LASIX) tablet 40 mg, 40 mg, Oral, Daily, Zierle-Ghosh, Asia B, DO, 40 mg at 09/16/19 1029 .  insulin aspart (novoLOG) injection 0-5 Units, 0-5 Units, Subcutaneous, QHS, Zierle-Ghosh, Asia B, DO .  insulin aspart (novoLOG) injection 8 Units, 8 Units, Subcutaneous, TID WC, Barton Dubois, MD .  insulin glargine (LANTUS) injection 130 Units, 130 Units, Subcutaneous, Daily, Zierle-Ghosh, Asia B, DO, 130 Units at 09/15/19 1043 .  levothyroxine (SYNTHROID) tablet 25 mcg, 25 mcg, Oral, QHS, Zierle-Ghosh, Asia B, DO, 25 mcg at 09/15/19 2133 .  ondansetron (ZOFRAN) tablet 4 mg, 4 mg, Oral, Q6H PRN **OR** ondansetron (ZOFRAN) injection 4 mg, 4 mg, Intravenous, Q6H PRN, Zierle-Ghosh, Asia B, DO .  pantoprazole (PROTONIX) EC tablet 40 mg, 40 mg, Oral, Q0600, Zierle-Ghosh, Asia B, DO .  pregabalin (LYRICA) capsule 100 mg, 100 mg, Oral, BID, Zierle-Ghosh, Asia B, DO, 100 mg at 09/16/19 1029 .  rivaroxaban (XARELTO) tablet 20 mg, 20 mg, Oral, Q supper, Zierle-Ghosh, Asia B, DO, 20 mg at 09/15/19 1756 .  vortioxetine HBr (TRINTELLIX) tablet 20 mg, 20 mg, Oral, QHS, Zierle-Ghosh, Asia B, DO, 20 mg at 09/15/19 2143 .  zolpidem (AMBIEN) tablet 5 mg, 5 mg, Oral, QHS PRN, Zierle-Ghosh, Asia B, DO, 5 mg at 09/14/19 2320 ANALYSIS: A 16 channel recording using standard 10 20 measurements is conducted for 23 minutes.  There is a well-formed posterior dominant rhythm of 7 Hz  which attenuates with eye opening.  There is beta activity observed in frontal areas.  The recording transition to generalized theta slowing indicating drowsiness.  Photic stimulation is carried out without abnormal changes in the background activity.  There is no focal slowing noted.  No epileptiform activities are noted.  IMPRESSION: 1.  This recording shows mild global slowing indicating a mild global encephalopathy.  However, no epileptiform activities are noted. Kofi A. Merlene Laughter, M.D. Diplomate, Tax adviser of Psychiatry and Neurology ( Neurology).    Microbiology: Recent Results (from the past 240 hour(s))  Urine Culture     Status: Abnormal   Collection Time: 09/14/19  3:35 PM   Specimen: Urine, Random  Result Value Ref Range Status   Specimen Description   Final    URINE, RANDOM Performed at [facility], [address], [place], [place] [address]    Special Requests   Final    NONE Performed at [facility], [address] [place], [place] [address]    Culture (A)  Final    >=100,000 COLONIES/mL GROUP B STREP(S.AGALACTIAE)ISOLATED TESTING AGAINST S. AGALACTIAE NOT ROUTINELY PERFORMED DUE TO PREDICTABILITY OF AMP/PEN/VAN SUSCEPTIBILITY. Performed at [name], [place] [address] [place], [place] [address]    Report Status 09/16/2019 FINAL  Final  SARS Coronavirus 2 by RT PCR ([name] order, performed in [name]) Nasopharyngeal Urine, Catheterized     Status: None   Collection Time: 09/14/19  8:06 PM   Specimen: Urine, Catheterized; Nasopharyngeal  Result Value Ref Range Status   SARS Coronavirus 2 NEGATIVE NEGATIVE Final    Comment: (NOTE) SARS-CoV-2 target nucleic acids are NOT DETECTED.  The SARS-CoV-2 RNA is generally detectable in upper and lower respiratory specimens during the acute phase of infection. The lowest concentration of SARS-CoV-2 viral copies this assay can detect is 250 copies / mL. A negative result does not preclude SARS-CoV-2  infection and should not be used as the sole basis for treatment or other patient management decisions.  A negative result may occur with improper specimen collection / handling, submission of specimen other than nasopharyngeal swab, presence of viral mutation(s) within the areas targeted by this assay, and inadequate number of viral copies (<250 copies / mL). A negative result must be combined with clinical observations, patient history, and epidemiological information.  Fact Sheet for Patients:   StrictlyIdeas.no  Fact Sheet for Healthcare Providers: BankingDealers.co.za  This test is not yet approved or  cleared by the [place] FDA and has been authorized for detection and/or diagnosis of SARS-CoV-2 by FDA under an Emergency Use Authorization (EUA).  This EUA will remain in effect (meaning this test can be used) for the duration of the COVID-19 declaration under Section 564(b)(1) of the Act, 21 U.S.C. section 360bbb-3(b)(1), unless the authorization is terminated or revoked sooner.  Performed at [name], [address], [place],  [address]      Labs: Basic Metabolic Panel: Recent Labs  Lab 09/13/19 1714 09/14/19 1554 09/15/19 0415  NA 138 138 142  K 4.7 3.6 4.0  CL 102 102 105  CO2 28 26 27   GLUCOSE 190* 188* 149*  BUN 26* 28* 25*  CREATININE 1.69* 1.56* 1.42*  CALCIUM 9.6 9.2 9.0  MG  --   --  2.3   Liver Function Tests: Recent Labs  Lab 09/13/19 1714 09/14/19 1554 09/15/19 0415  AST 17 24 21   ALT 23 25 24   ALKPHOS  --  85 78  BILITOT 0.8 0.7 0.7  PROT 7.1 7.2 6.7  ALBUMIN  --  3.7 3.5    Recent Labs  Lab 09/14/19 1554  AMMONIA 30   CBC: Recent Labs  Lab 09/14/19 1554 09/15/19 0415  WBC 6.0 6.0  NEUTROABS 3.2  --   HGB 16.0 15.9  HCT 46.1 46.8  MCV 94.9 95.3  PLT 130* 125*   Cardiac Enzymes: Recent Labs  Lab 09/14/19 1554  CKTOTAL 541*   BNP: BNP (last 3 results) Recent  Labs    09/14/19 1559  BNP 67.0   CBG: Recent Labs  Lab 09/15/19 1125 09/15/19 1727 09/15/19 2148 09/16/19 0803 09/16/19 1207  GLUCAP 143* 84 151* 81 97   Signed:  Barton Dubois MD.  [facility] 09/16/2019, 3:13 PM

## 2019-09-21 ENCOUNTER — Telehealth: Payer: Self-pay

## 2019-09-21 NOTE — Telephone Encounter (Signed)
Called pt and sch appt for 09/28/19

## 2019-09-21 NOTE — Telephone Encounter (Signed)
Pt was seen at [facility] and discharged on 09/14/2019 if you were unaware.  PCP scheduling: Note to have him schedule Hosp follow up with Carlota Raspberry

## 2019-09-22 ENCOUNTER — Telehealth: Payer: Self-pay | Admitting: *Deleted

## 2019-09-22 ENCOUNTER — Telehealth: Payer: Self-pay | Admitting: Orthopaedic Surgery

## 2019-09-22 NOTE — Telephone Encounter (Signed)
A detailed message was left,re: his follow up visit. °

## 2019-09-22 NOTE — Telephone Encounter (Signed)
Patient called advised he would like to get the gel injections again for both of his knees. The number to contact patient is [phone]

## 2019-09-23 NOTE — Telephone Encounter (Signed)
Noted  

## 2019-09-23 NOTE — Telephone Encounter (Signed)
Please submit for bil knees

## 2019-09-26 ENCOUNTER — Telehealth: Payer: Self-pay

## 2019-09-26 ENCOUNTER — Other Ambulatory Visit: Payer: Self-pay | Admitting: Cardiovascular Disease

## 2019-09-26 ENCOUNTER — Other Ambulatory Visit (INDEPENDENT_AMBULATORY_CARE_PROVIDER_SITE_OTHER): Payer: Self-pay | Admitting: Orthopaedic Surgery

## 2019-09-26 ENCOUNTER — Other Ambulatory Visit: Payer: Self-pay | Admitting: Family Medicine

## 2019-09-26 DIAGNOSIS — Z8669 Personal history of other diseases of the nervous system and sense organs: Secondary | ICD-10-CM

## 2019-09-26 DIAGNOSIS — G8929 Other chronic pain: Secondary | ICD-10-CM

## 2019-09-26 DIAGNOSIS — I10 Essential (primary) hypertension: Secondary | ICD-10-CM

## 2019-09-26 DIAGNOSIS — M25562 Pain in left knee: Secondary | ICD-10-CM

## 2019-09-26 DIAGNOSIS — E785 Hyperlipidemia, unspecified: Secondary | ICD-10-CM

## 2019-09-26 NOTE — Telephone Encounter (Signed)
Requested medication (s) are due for refill today: no  Requested medication (s) are on the active medication list: yes  Last refill:  05/06/19   Future visit scheduled: yes  Notes to clinic:  med not delegated to NT to RF   Requested Prescriptions  Pending Prescriptions Disp Refills   divalproex (DEPAKOTE ER) 500 MG 24 hr tablet [Pharmacy Med Name: DIVALPROEX EXTENDED RELEASE 500MG T] 90 tablet     Sig: TAKE 1 TABLET(500 MG) BY MOUTH AT BEDTIME      Not Delegated - Neurology:  Anticonvulsants - Valproates Failed - 09/26/2019  4:41 PM      Failed - This refill cannot be delegated      Failed - PLT in normal range and within 360 days    Platelets  Date Value Ref Range Status  09/15/2019 125 (L) 150 - 400 K/uL Final  11/17/2018 149 (L) 150 - 450 x10E3/uL Final   Platelet Count, POC  Date Value Ref Range Status  11/26/2015 161 142 - 424 K/uL Final          Failed - Valproic Acid (serum) in normal range and within 360 days    Valproic Acid Lvl  Date Value Ref Range Status  09/14/2019 20 (L) 50.0 - 100.0 ug/mL Final    Comment:    Performed at [facility], [address], [place], [place] [address]          Passed - AST in normal range and within 360 days    AST  Date Value Ref Range Status  09/15/2019 21 15 - 41 U/L Final          Passed - ALT in normal range and within 360 days    ALT  Date Value Ref Range Status  09/15/2019 24 0 - 44 U/L Final          Passed - HGB in normal range and within 360 days    Hemoglobin  Date Value Ref Range Status  09/15/2019 15.9 13.0 - 17.0 g/dL Final  11/17/2018 17.3 13.0 - 17.7 g/dL Final   HGB  Date Value Ref Range Status  05/19/2011 17.1 13.0 - 17.1 g/dL Final          Passed - WBC in normal range and within 360 days    WBC  Date Value Ref Range Status  09/15/2019 6.0 4.0 - 10.5 K/uL Final          Passed - HCT in normal range and within 360 days    HCT  Date Value Ref Range Status  09/15/2019 46.8 39 - 52 %  Final  05/19/2011 49.1 38 - 49 % Final   Hematocrit  Date Value Ref Range Status  11/17/2018 50.0 37.5 - 51.0 % Final          Passed - Valid encounter within last 12 months    Recent Outpatient Visits           1 month ago Right-sided low back pain with right-sided sciatica, unspecified chronicity   [facility] at [facility], Ranell Patrick, MD   4 months ago Right-sided low back pain with right-sided sciatica, unspecified chronicity   [facility] at [facility], Ranell Patrick, MD   5 months ago Medicare annual wellness visit, subsequent   Primary Care at [facility], Ines Bloomer, MD   10 months ago Need for pneumococcal vaccination   [facility] at [facility], Ranell Patrick, MD   11 months ago History of DVT (  deep vein thrombosis)   [facility] at [facility], Ranell Patrick, MD       Future Appointments             Tomorrow Marybelle Killings, MD [place]   In 2 days [name], MD Primary Care at [place], [facility]   In 1 month Skeet Latch, MD [facility] [place], [place]   In 2 months [name], MD Primary Care at [place], [facility]             Signed Prescriptions Disp Refills   diclofenac Sodium (VOLTAREN) 1 % GEL 300 g 0    Sig: APPLY 2 GRAMS TOPICALLY TO EACH KNEE IN THE MORNING AND AT BEDTIME- APPLY 1 GRAM TO EACH KNEE IN THE AFTERNOON      Analgesics:  Topicals Passed - 09/26/2019  4:41 PM      Passed - Valid encounter within last 12 months    Recent Outpatient Visits           1 month ago Right-sided low back pain with right-sided sciatica, unspecified chronicity   [facility] at [facility], Ranell Patrick, MD   4 months ago Right-sided low back pain with right-sided sciatica, unspecified chronicity   [facility] at [facility], Ranell Patrick, MD   5 months ago Medicare annual wellness visit, subsequent   [facility] at [facility], Ines Bloomer, MD   10 months ago Need for pneumococcal vaccination   [facility] at [facility], Ranell Patrick, MD   11 months ago History of DVT (deep vein thrombosis)   [facility] at [facility], Ranell Patrick, MD       Future Appointments             Tomorrow Marybelle Killings, MD [place]   In 2 days [name], MD Primary Care at [place], [facility]   In 1 month Skeet Latch, MD [facility], [facility]   In 2 months Carlota Raspberry, Ranell Patrick, MD Primary Care at [place], [facility]

## 2019-09-26 NOTE — Telephone Encounter (Signed)
Requested Prescriptions  Pending Prescriptions Disp Refills  . divalproex (DEPAKOTE ER) 500 MG 24 hr tablet [Pharmacy Med Name: DIVALPROEX EXTENDED RELEASE 500MG T] 90 tablet     Sig: TAKE 1 TABLET(500 MG) BY MOUTH AT BEDTIME     Not Delegated - Neurology:  Anticonvulsants - Valproates Failed - 09/26/2019  4:41 PM      Failed - This refill cannot be delegated      Failed - PLT in normal range and within 360 days    Platelets  Date Value Ref Range Status  09/15/2019 125 (L) 150 - 400 K/uL Final  11/17/2018 149 (L) 150 - 450 x10E3/uL Final   Platelet Count, POC  Date Value Ref Range Status  11/26/2015 161 142 - 424 K/uL Final         Failed - Valproic Acid (serum) in normal range and within 360 days    Valproic Acid Lvl  Date Value Ref Range Status  09/14/2019 20 (L) 50.0 - 100.0 ug/mL Final    Comment:    Performed at [facility], [address], [place], [place] [address]         Passed - AST in normal range and within 360 days    AST  Date Value Ref Range Status  09/15/2019 21 15 - 41 U/L Final         Passed - ALT in normal range and within 360 days    ALT  Date Value Ref Range Status  09/15/2019 24 0 - 44 U/L Final         Passed - HGB in normal range and within 360 days    Hemoglobin  Date Value Ref Range Status  09/15/2019 15.9 13.0 - 17.0 g/dL Final  11/17/2018 17.3 13.0 - 17.7 g/dL Final   HGB  Date Value Ref Range Status  05/19/2011 17.1 13.0 - 17.1 g/dL Final         Passed - WBC in normal range and within 360 days    WBC  Date Value Ref Range Status  09/15/2019 6.0 4.0 - 10.5 K/uL Final         Passed - HCT in normal range and within 360 days    HCT  Date Value Ref Range Status  09/15/2019 46.8 39 - 52 % Final  05/19/2011 49.1 38 - 49 % Final   Hematocrit  Date Value Ref Range Status  11/17/2018 50.0 37.5 - 51.0 % Final         Passed - Valid encounter within last 12 months    Recent Outpatient Visits          1 month ago Right-sided low  back pain with right-sided sciatica, unspecified chronicity   [facility] at [facility], Ranell Patrick, MD   4 months ago Right-sided low back pain with right-sided sciatica, unspecified chronicity   [facility] at [facility], Ranell Patrick, MD   5 months ago Medicare annual wellness visit, subsequent   Primary Care at [facility], Ines Bloomer, MD   10 months ago Need for pneumococcal vaccination   [facility] at [facility], Ranell Patrick, MD   11 months ago History of DVT (deep vein thrombosis)   Primary Care at [facility], Ranell Patrick, MD      Future Appointments            Tomorrow Marybelle Killings, MD [place]   In 2 days [name], MD Primary Care at [place], [facility]  In 1 month Skeet Latch, MD [facility], [facility]   In 2 months [name], MD Primary Care at [facility], [facility]           . diclofenac Sodium (VOLTAREN) 1 % GEL [Pharmacy Med Name: DICLOFENAC 1% GEL 100GM] 300 g 0    Sig: APPLY 2 GRAMS TOPICALLY TO EACH KNEE IN THE MORNING AND AT BEDTIME- APPLY 1 GRAM TO EACH KNEE IN THE AFTERNOON     Analgesics:  Topicals Passed - 09/26/2019  4:41 PM      Passed - Valid encounter within last 12 months    Recent Outpatient Visits          1 month ago Right-sided low back pain with right-sided sciatica, unspecified chronicity   [facility] at [facility], Ranell Patrick, MD   4 months ago Right-sided low back pain with right-sided sciatica, unspecified chronicity   [facility] at [facility], Ranell Patrick, MD   5 months ago Medicare annual wellness visit, subsequent   Primary Care at [facility], Ines Bloomer, MD   10 months ago Need for pneumococcal vaccination   Primary [facility] at [facility], Ranell Patrick, MD   11 months ago History of DVT (deep vein thrombosis)   Primary Care at [facility], Ranell Patrick, MD      Future Appointments            Tomorrow Marybelle Killings, MD [place]   In 2 days [name], MD  Primary Care at [place], [facility]   In Alto month Skeet Latch, MD [facility] [place], [facility]   In 2 months Carlota Raspberry, Ranell Patrick, MD Primary Care at [place], [facility]

## 2019-09-26 NOTE — Telephone Encounter (Signed)
Submitted for VOB for Monovisc-Bilateral knee

## 2019-09-27 ENCOUNTER — Telehealth: Payer: Self-pay

## 2019-09-27 ENCOUNTER — Ambulatory Visit: Payer: PPO | Admitting: Orthopaedic Surgery

## 2019-09-27 NOTE — Telephone Encounter (Signed)
Approved for Monvisc-Bilateral knee Dr. Frederik Pear and Bill $20 copay 20% OOP No prior auth required

## 2019-09-27 NOTE — Telephone Encounter (Signed)
Pt was called and informed and stated understading  Appt was scheduled

## 2019-09-28 ENCOUNTER — Ambulatory Visit (INDEPENDENT_AMBULATORY_CARE_PROVIDER_SITE_OTHER): Payer: PPO | Admitting: Family Medicine

## 2019-09-28 ENCOUNTER — Other Ambulatory Visit: Payer: Self-pay

## 2019-09-28 ENCOUNTER — Encounter: Payer: Self-pay | Admitting: Family Medicine

## 2019-09-28 VITALS — BP 130/86 | HR 93 | Temp 98.7°F | Ht 71.0 in | Wt 258.0 lb

## 2019-09-28 DIAGNOSIS — Z9181 History of falling: Secondary | ICD-10-CM | POA: Diagnosis not present

## 2019-09-28 DIAGNOSIS — G8929 Other chronic pain: Secondary | ICD-10-CM | POA: Diagnosis not present

## 2019-09-28 DIAGNOSIS — R748 Abnormal levels of other serum enzymes: Secondary | ICD-10-CM

## 2019-09-28 DIAGNOSIS — N390 Urinary tract infection, site not specified: Secondary | ICD-10-CM

## 2019-09-28 DIAGNOSIS — N183 Chronic kidney disease, stage 3 unspecified: Secondary | ICD-10-CM | POA: Diagnosis not present

## 2019-09-28 DIAGNOSIS — M25561 Pain in right knee: Secondary | ICD-10-CM | POA: Diagnosis not present

## 2019-09-28 DIAGNOSIS — M25562 Pain in left knee: Secondary | ICD-10-CM | POA: Diagnosis not present

## 2019-09-28 DIAGNOSIS — E1142 Type 2 diabetes mellitus with diabetic polyneuropathy: Secondary | ICD-10-CM

## 2019-09-28 NOTE — Progress Notes (Signed)
Subjective:  Patient ID: Daniel [name], male    DOB: 1955-09-28  Age: 64 y.o. MRN: [id number]  CC:  Chief Complaint  Patient presents with  . Hospitalization Follow-up    Pt went to the hospital on 09/14/2019. Pt went to there because he was falling down. while the pt was there he was told he had a UTI.  PT states since he left the hospital he has felt like his usual self and hasn't had any falls since he left.    HPI Daniel [name] [facility] presents for   Hospital follow-up: Acute encephalopathy, generalized weakness, UTI. Admitted July 21 through July 23, after initial emergency room evaluation for increasing falls, 5-6 falls within a 24-hour period.  No LOC.  No head injury. Thought to be multifactorial because of generalized weakness and falling.  In the setting of UTI questionable seizure versus polypharmacy.  Creatinine was at baseline, TSH low at 0.108, levothyroxine reduced to 25 mcg, ammonia was normal, valproic acid low at 20 (takes for migraine prophylaxis, no recent migraine - well controlled).  Ammonia normal at 30, CPK 541, troponin IV, alcohol undetectable.  UDS positive for amphetamines and benzos, both consistent with his medications.  CT head with no evidence of acute intracranial abnormality.  Chest x-ray with cardiomegaly and mild vascular congestion without consolidation effusion or pneumothorax.  Normal B12 level, negative EEG.  Neurology consulted, thought symptoms were multifactorial triggered by UTI.  Closely monitoring medications including psychotropics and polypharmacy with recommendations to adjust if possible with his psychiatrist.  Diabetic neuropathy also contributing to gait, continued on Lyrica. - this dose has worked well.  No current assistive device.  Left ear sounded higher pitch with using phone past week or two, hearing me normal at this time, sounds ok this morning. Did flush out ear with water this am.  Has shower chair.  No symptomatic lows since  hospitalization.   No actual falls since hospital. Drinking gatorade. Some dizziness if leaning to far forward. Some chronic pain in knees and legs - has follow up planned with [name] for injections. Sometimes sore in thighs as well.  Lab Results  Component Value Date   CKTOTAL 85 09/28/2019   Has discussed med regimen with Dr. Toy Care since hospitalization, cutting back on Adderall dosage initially. Has stopped belsomra.  Urinary tract infection Culture positive for greater than 100,000 colonies of group B strep.  Treated empirically with Rocephin inpatient, Recommended to maintain adequate hydration and nutrition, treatment of UTI with Omnicef 300 mg twice daily for an additional 5 days. No fever/dysuria. Completed 5 days of antibiotic.   Hypothyroidism: Lab Results  Component Value Date   TSH 0.108 (L) 09/14/2019  Synthroid was decreased to 25 mcg daily in the hospital with planned repeat TSH in 6 to 8 weeks.   Acute kidney injury on chronic kidney disease stage IIIA Thought to be in the setting of dehydration and prerenal azotemia.  Continued minimization of nephrotoxic agents, renal function returned to baseline after fluid resuscitation. Minimal increase to 1.69 on 09/13/19 from 1.63 in 05/19/19.  Lab Results  Component Value Date   CREATININE 1.28 (H) 09/28/2019    History Patient Active Problem List   Diagnosis Date Noted  . Acute encephalopathy   . Generalized weakness 09/14/2019  . Severe major depression ([place]) 11/17/2018  . Class 2 obesity due to excess calories without serious comorbidity with body mass index (BMI) of 36.0 to 36.9 in adult 11/17/2018  . Diabetes ([place]) 11/09/2018  .  Anticoagulated 08/24/2018  . Dyslipidemia 07/27/2018  . Essential hypertension 06/25/2018  . Hypogonadism in male 05/10/2018  . Numbness 03/31/2018  . Diabetic peripheral neuropathy ([place]) 01/25/2018  . History of syncope 01/25/2018  . S/P transmetatarsal amputation of foot, right ([place])  10/09/2017  . Osteomyelitis ([place])   . Hypothyroidism   . Constipation   . History of DVT (deep vein thrombosis) 09/30/2017  . Obesity, Class III, BMI 40-49.9 (morbid obesity) ([place]) 01/15/2017  . DOE (dyspnea on exertion) 01/13/2017  . Chronic migraine 09/17/2016  . Gait abnormality 09/17/2016  . Diabetic foot infection ([place]) 06/28/2016  . Fall 12/05/2015  . Toe ulcer, right ([place]) 09/19/2015  . Decreased pedal pulses 09/19/2015  . OSA on CPAP 09/05/2015  . Major depressive disorder, recurrent episode, moderate ([place]) 09/05/2015  . Low back pain 06/11/2015  . Abnormality of gait 06/11/2015  . Dizziness 03/17/2015  . Weakness 02/21/2015  . Chronic renal insufficiency, stage III (moderate) ([place]) 08/09/2014  . Insulin-requiring or dependent type II diabetes mellitus ([place]) 11/07/2013  . Hematuria 06/21/2013  . Hepatic steatosis 09/09/2010  . Urinary tract infection without hematuria 09/15/2006  . Human immunodeficiency virus (HIV) disease ([place]) 06/04/2006  . HERPES ZOSTER, UNCOMPLICATED 89/21/1941  . Depression 06/04/2006  . THROMBOPHLEBITIS NOS 06/04/2006  . GERD 06/04/2006  . ARTHRITIS, HAND 06/04/2006   Past Medical History:  Diagnosis Date  . ADHD (attention deficit hyperactivity disorder)   . Anxiety   . Chronic kidney disease   . Clotting disorder ([place])   . Depression   . Diabetes mellitus without complication ([place])   . Diabetes mellitus, type II ([place])   . Dizziness 03/17/2015  . Essential hypertension 06/25/2018  . GERD (gastroesophageal reflux disease)   . HIV disease ([place])   . HIV infection ([place])   . Liver disease   . OSA (obstructive sleep apnea) 07/25/2015   Uses CPAP regularly  . Peripheral vascular disease ([place])   . Ulcer    Past Surgical History:  Procedure Laterality Date  . AMPUTATION Right 10/02/2017   Procedure: RIGHT TRANSMETATARSAL AMPUTATION;  Surgeon: Leandrew Koyanagi, MD;  Location: [facility];  Service: Orthopedics;  Laterality: Right;  . SMALL INTESTINE  SURGERY    . STOMACH SURGERY    . TOE AMPUTATION Right 08/2016   right great toe   Allergies  Allergen Reactions  . Aspirin Swelling  . Ibuprofen Swelling  . Sustiva [Efavirenz] Swelling and Rash  . Nsaids Other (See Comments)    unknwn  . Lipitor [Atorvastatin Calcium] Other (See Comments)    Leg pain   Prior to Admission medications   Medication Sig Start Date End Date Taking? Authorizing Provider  abacavir-dolutegravir-lamiVUDine (TRIUMEQ) 600-50-300 MG tablet Take 1 tablet by mouth daily. Patient taking differently: Take 1 tablet by mouth at bedtime.  09/13/19  Yes Golden Circle, FNP  acetaminophen (TYLENOL) 500 MG tablet Take 1,500 mg by mouth in the morning and at bedtime.    Yes [provider]  ALPRAZolam Duanne Moron) 1 MG tablet Take 1 mg by mouth at bedtime. *May take one tablet up to 4 times daily as needed for anxiety   Yes [provider]  amphetamine-dextroamphetamine (ADDERALL) 30 MG tablet Take 30 mg by mouth 3 (three) times daily.   Yes [provider]  Brexpiprazole (REXULTI) 1 MG TABS Take 1 mg by mouth at bedtime.   Yes [provider]  canagliflozin (INVOKANA) 100 MG TABS tablet Take 1 tablet (100 mg total) by mouth daily before breakfast.  02/01/19  Yes Renato Shin, MD  Continuous Blood Gluc Sensor (FREESTYLE LIBRE 14 DAY SENSOR) MISC USE AS DIRECTED EVERY 14 DAYS 06/06/19  Yes Renato Shin, MD  diclofenac Sodium (VOLTAREN) 1 % GEL APPLY 2 GRAMS TOPICALLY TO EACH KNEE IN THE MORNING AND AT BEDTIME- APPLY 1 GRAM TO EACH KNEE IN THE AFTERNOON 09/26/19  Yes [name], MD  divalproex (DEPAKOTE ER) 500 MG 24 hr tablet TAKE 1 TABLET(500 MG) BY MOUTH AT BEDTIME 09/27/19  Yes [name], MD  Dulaglutide (TRULICITY) 3 YT/2.4MQ SOPN Inject 0.5 mLs (3 mg total) into the skin once a week. Patient taking differently: Inject 3 mg into the skin every Saturday.  08/24/19  Yes Renato Shin, MD  ezetimibe (ZETIA) 10 MG tablet TAKE 1  TABLET(10 MG) BY MOUTH DAILY 09/26/19  Yes Skeet Latch, MD  furosemide (LASIX) 40 MG tablet TAKE 1 TABLET(40 MG) BY MOUTH DAILY 09/26/19  Yes Skeet Latch, MD  insulin aspart (NOVOLOG FLEXPEN) 100 UNIT/ML FlexPen 15 units with breakfast, and 25 unit with supper Patient taking differently: Inject 15-25 Units into the skin See admin instructions. 15 units with breakfast, and 25 unit with supper 11/08/18  Yes Renato Shin, MD  insulin glargine (LANTUS SOLOSTAR) 100 UNIT/ML Solostar Pen Inject 130 Units into the skin every morning. Patient taking differently: Inject 150 Units into the skin every morning.  08/24/19  Yes Renato Shin, MD  Insulin Pen Needle (B-D ULTRAFINE III SHORT PEN) 31G X 8 MM MISC 1 each by Other route 3 (three) times daily. E11.9 08/30/19  Yes Renato Shin, MD  levothyroxine (SYNTHROID, LEVOTHROID) 50 MCG tablet Take 1 tablet (50 mcg total) by mouth at bedtime. 07/24/15  Yes Daub, Loura Back, MD  ondansetron (ZOFRAN) 8 MG tablet TAKE 1 TABLET(8 MG) BY MOUTH EVERY 8 HOURS AS NEEDED FOR NAUSEA OR VOMITING Patient taking differently: Take 8 mg by mouth every 8 (eight) hours as needed for nausea or vomiting.  08/30/19  Yes Comer, Okey Regal, MD  pantoprazole (PROTONIX) 40 MG tablet TAKE 1 TABLET(40 MG) BY MOUTH DAILY AT 6 AM Patient taking differently: Take 40 mg by mouth every evening.  05/19/19  Yes [name], MD  polyethylene glycol (MIRALAX / GLYCOLAX) packet Take 17 g by mouth 2 (two) times daily. Patient taking differently: Take 17 g by mouth 2 (two) times daily as needed for mild constipation or moderate constipation.  10/05/17  Yes Eugenie Filler, MD  pregabalin (LYRICA) 150 MG capsule Take 2 capsules (300 mg total) by mouth 2 (two) times daily. 05/19/19  Yes [name], MD  protriptyline (VIVACTIL) 10 MG tablet Take 10 mg by mouth 3 (three) times daily.  01/30/16  Yes [provider]  rivaroxaban (XARELTO) 20 MG TABS tablet TAKE 1 TABLET(20 MG) BY MOUTH  DAILY Patient taking differently: Take 20 mg by mouth daily with supper.  05/19/19  Yes [name], MD  TRINTELLIX 20 MG TABS tablet Take 20 mg by mouth at bedtime.  01/26/15  Yes [provider]  zolpidem (AMBIEN) 10 MG tablet Take 10 mg by mouth at bedtime.    Yes [provider]   Social History   Socioeconomic History  . Marital status: Single    Spouse name: Not on file  . Number of children: Not on file  . Years of education: Not on file  . Highest education level: Not on file  Occupational History    Comment: DISABILITY  Tobacco Use  .  Smoking status: Former Smoker    Packs/day: 0.10    Years: 10.00    Pack years: 1.00    Types: Cigars    Quit date: 08/09/2014    Years since quitting: 5.1  . Smokeless tobacco: Never Used  Vaping Use  . Vaping Use: Never used  Substance and Sexual Activity  . Alcohol use: No    Alcohol/week: 0.0 standard drinks  . Drug use: No  . Sexual activity: Not Currently    Partners: Male    Comment: pt. declined condoms  Other Topics Concern  . Not on file  Social History Narrative   Epworth Sleepiness Scale = 7 (as of 03/16/2015)   Social Determinants of Health   Financial Resource Strain:   . Difficulty of Paying Living Expenses:   Food Insecurity:   . Worried About Charity fundraiser in the Last Year:   . Arboriculturist in the Last Year:   Transportation Needs:   . Film/video editor (Medical):   [name] Lack of Transportation (Non-Medical):   Physical Activity:   . Days of Exercise per Week:   . Minutes of Exercise per Session:   Stress:   . Feeling of Stress :   Social Connections:   . Frequency of Communication with Friends and Family:   . Frequency of Social Gatherings with Friends and Family:   . Attends Religious Services:   . Active Member of Clubs or Organizations:   . Attends Archivist Meetings:   [name] Marital Status:   Intimate Partner Violence:   . Fear of Current or Ex-Partner:   .  Emotionally Abused:   [name] Physically Abused:   . Sexually Abused:     Review of Systems Per HPI.   Objective:   Vitals:   09/28/19 1058 09/28/19 1105  BP: (!) 148/78 130/86  Pulse: 93   Temp: 98.7 F (37.1 C)   TempSrc: Temporal   SpO2: 96%   Weight: 258 lb (117 kg)   Height: 5' 11"  (1.803 m)      Physical Exam Vitals reviewed.  Constitutional:      Appearance: He is well-developed.  HENT:     Head: Normocephalic and atraumatic.     Right Ear: Ear canal and external ear normal. There is no impacted cerumen.     Left Ear: Ear canal and external ear normal. There is no impacted cerumen.     Ears:     Comments: ? Scarred tm, no apparent effusion, no erythema.  Eyes:     Pupils: Pupils are equal, round, and reactive to light.  Neck:     Vascular: No carotid bruit or JVD.  Cardiovascular:     Rate and Rhythm: Normal rate and regular rhythm.     Heart sounds: Normal heart sounds. No murmur heard.   Pulmonary:     Effort: Pulmonary effort is normal.     Breath sounds: Normal breath sounds. No rales.  Abdominal:     Tenderness: There is no abdominal tenderness. There is no right CVA tenderness or left CVA tenderness.  Skin:    General: Skin is warm and dry.  Neurological:     Mental Status: He is alert and oriented to person, place, and time.     41 minutes spent during visit, greater than 50% counseling and assimilation of information, chart review, and discussion of plan, review of hospital records.    Assessment & Plan:  Daniel [name] is a 64  y.o. male . Personal history of fall  -Stable since hospital discharge.  Fall precautions discussed, assistive device recommended.  Continue to work with psychiatry regarding polypharmacy.  Hypoglycemia precautions with RTC precautions.  -Ear exam overall reassuring but if any return of ear issues, middle ear dysfunction may also contribute to unsteadiness.  RTC precautions.  Urinary tract infection without hematuria, site  unspecified  -Clinically resolved, RTC precautions.  Elevated CPK - Plan: CK  -Repeat testing  Diabetic peripheral neuropathy ([name])  -Stable with current dose of Lyrica.  May also be contributing to unsteadiness.  Assistive device discussed as above.  Chronic pain of both knees  -Has follow-up planned with orthopedics.  Stage 3 chronic kidney disease, unspecified whether stage 3a or 3b CKD - Plan: Basic metabolic panel  -Repeat electrolytes, avoid nephrotoxins, maintain hydration  No orders of the defined types were placed in this encounter.  Patient Instructions    Contineu to follow up with Dr. Toy Care to evaluate your meds to see if any adjustments can be made with your history of falls.  I will recheck your electrolytes today.  You should be on 1mg per day of levothyroxine. Repeat thyroid test in 1 month to make sure that level is stable.  Continue to drink plenty of fluids.   If you have further unsteadiness, I would recommend using a rolling walker or a 4 point cane at minimum for some assistance.   If any return of ear issues, follow up here or with ENT.   If any low blood sugars - see me right away. Make sure not to skip meals.   Return to the clinic or go to the nearest emergency room if any of your symptoms worsen or new symptoms occur.   Fall Prevention in the Home, Adult Falls can cause injuries and can affect people from all age groups. There are many simple things that you can do to make your home safe and to help prevent falls. Ask for help when making these changes, if needed. What actions can I take to prevent falls? General instructions  Use good lighting in all rooms. Replace any light bulbs that burn out.  Turn on lights if it is dark. Use night-lights.  Place frequently used items in easy-to-reach places. Lower the shelves around your home if necessary.  Set up furniture so that there are clear paths around it. Avoid moving your furniture  around.  Remove throw rugs and other tripping hazards from the floor.  Avoid walking on wet floors.  Fix any uneven floor surfaces.  Add color or contrast paint or tape to grab bars and handrails in your home. Place contrasting color strips on the first and last steps of stairways.  When you use a stepladder, make sure that it is completely opened and that the sides are firmly locked. Have someone hold the ladder while you are using it. Do not climb a closed stepladder.  Be aware of any and all pets. What can I do in the bathroom?      Keep the floor dry. Immediately clean up any water that spills onto the floor.  Remove soap buildup in the tub or shower on a regular basis.  Use non-skid mats or decals on the floor of the tub or shower.  Attach bath mats securely with double-sided, non-slip rug tape.  If you need to sit down while you are in the shower, use a plastic, non-slip stool.  Install grab bars by the toilet and in  the tub and shower. Do not use towel bars as grab bars. What can I do in the bedroom?  Make sure that a bedside light is easy to reach.  Do not use oversized bedding that drapes onto the floor.  Have a firm chair that has side arms to use for getting dressed. What can I do in the kitchen?  Clean up any spills right away.  If you need to reach for something above you, use a sturdy step stool that has a grab bar.  Keep electrical cables out of the way.  Do not use floor polish or wax that makes floors slippery. If you must use wax, make sure that it is non-skid floor wax. What can I do in the stairways?  Do not leave any items on the stairs.  Make sure that you have a light switch at the top of the stairs and the bottom of the stairs. Have them installed if you do not have them.  Make sure that there are handrails on both sides of the stairs. Fix handrails that are broken or loose. Make sure that handrails are as long as the stairways.  Install  non-slip stair treads on all stairs in your home.  Avoid having throw rugs at the top or bottom of stairways, or secure the rugs with carpet tape to prevent them from moving.  Choose a carpet design that does not hide the edge of steps on the stairway.  Check any carpeting to make sure that it is firmly attached to the stairs. Fix any carpet that is loose or worn. What can I do on the outside of my home?  Use bright outdoor lighting.  Regularly repair the edges of walkways and driveways and fix any cracks.  Remove high doorway thresholds.  Trim any shrubbery on the main path into your home.  Regularly check that handrails are securely fastened and in good repair. Both sides of any steps should have handrails.  Install guardrails along the edges of any raised decks or porches.  Clear walkways of debris and clutter, including tools and rocks.  Have leaves, snow, and ice cleared regularly.  Use sand or salt on walkways during winter months.  In the garage, clean up any spills right away, including grease or oil spills. What other actions can I take?  Wear closed-toe shoes that fit well and support your feet. Wear shoes that have rubber soles or low heels.  Use mobility aids as needed, such as canes, walkers, scooters, and crutches.  Review your medicines with your health care provider. Some medicines can cause dizziness or changes in blood pressure, which increase your risk of falling. Talk with your health care provider about other ways that you can decrease your risk of falls. This may include working with a physical therapist or trainer to improve your strength, balance, and endurance. Where to find more information  Centers for Disease Control and Prevention, [place]: WebmailGuide.co.za  [employer] on Aging: BrainJudge.co.uk Contact a health care provider if:  You are afraid of falling at home.  You feel weak, drowsy, or dizzy at home.  You fall at  home. Summary  There are many simple things that you can do to make your home safe and to help prevent falls.  Ways to make your home safe include removing tripping hazards and installing grab bars in the bathroom.  Ask for help when making these changes in your home. This information is not intended to replace advice given to  you by your health care provider. Make sure you discuss any questions you have with your health care provider. Document Revised: 01/23/2017 Document Reviewed: 09/25/2016 Elsevier Patient Education  [employer].   If you have lab work done today you will be contacted with your lab results within the next 2 weeks.  If you have not heard from [name] then please contact us. The fastest way to get your results is to register for My Chart.   IF you received an x-ray today, you will receive an invoice from [facility]. Please contact [facility] at [phone] with questions or concerns regarding your invoice.   IF you received labwork today, you will receive an invoice from [facility]. Please contact [facility] at [phone] with questions or concerns regarding your invoice.   Our billing staff will not be able to assist you with questions regarding bills from these companies.  You will be contacted with the lab results as soon as they are available. The fastest way to get your results is to activate your My Chart account. Instructions are located on the last page of this paperwork. If you have not heard from [name] regarding the results in 2 weeks, please contact this office.         Signed, [name], MD [facility]

## 2019-09-28 NOTE — Patient Instructions (Addendum)
Contineu to follow up with Dr. Toy Care to evaluate your meds to see if any adjustments can be made with your history of falls.  I will recheck your electrolytes today.  You should be on 49mg per day of levothyroxine. Repeat thyroid test in 1 month to make sure that level is stable.  Continue to drink plenty of fluids.   If you have further unsteadiness, I would recommend using a rolling walker or a 4 point cane at minimum for some assistance.   If any return of ear issues, follow up here or with ENT.   If any low blood sugars - see me right away. Make sure not to skip meals.   Return to the clinic or go to the nearest emergency room if any of your symptoms worsen or new symptoms occur.   Fall Prevention in the Home, Adult Falls can cause injuries and can affect people from all age groups. There are many simple things that you can do to make your home safe and to help prevent falls. Ask for help when making these changes, if needed. What actions can I take to prevent falls? General instructions  Use good lighting in all rooms. Replace any light bulbs that burn out.  Turn on lights if it is dark. Use night-lights.  Place frequently used items in easy-to-reach places. Lower the shelves around your home if necessary.  Set up furniture so that there are clear paths around it. Avoid moving your furniture around.  Remove throw rugs and other tripping hazards from the floor.  Avoid walking on wet floors.  Fix any uneven floor surfaces.  Add color or contrast paint or tape to grab bars and handrails in your home. Place contrasting color strips on the first and last steps of stairways.  When you use a stepladder, make sure that it is completely opened and that the sides are firmly locked. Have someone hold the ladder while you are using it. Do not climb a closed stepladder.  Be aware of any and all pets. What can I do in the bathroom?      Keep the floor dry. Immediately clean up any  water that spills onto the floor.  Remove soap buildup in the tub or shower on a regular basis.  Use non-skid mats or decals on the floor of the tub or shower.  Attach bath mats securely with double-sided, non-slip rug tape.  If you need to sit down while you are in the shower, use a plastic, non-slip stool.  Install grab bars by the toilet and in the tub and shower. Do not use towel bars as grab bars. What can I do in the bedroom?  Make sure that a bedside light is easy to reach.  Do not use oversized bedding that drapes onto the floor.  Have a firm chair that has side arms to use for getting dressed. What can I do in the kitchen?  Clean up any spills right away.  If you need to reach for something above you, use a sturdy step stool that has a grab bar.  Keep electrical cables out of the way.  Do not use floor polish or wax that makes floors slippery. If you must use wax, make sure that it is non-skid floor wax. What can I do in the stairways?  Do not leave any items on the stairs.  Make sure that you have a light switch at the top of the stairs and the bottom of the stairs.  Have them installed if you do not have them.  Make sure that there are handrails on both sides of the stairs. Fix handrails that are broken or loose. Make sure that handrails are as long as the stairways.  Install non-slip stair treads on all stairs in your home.  Avoid having throw rugs at the top or bottom of stairways, or secure the rugs with carpet tape to prevent them from moving.  Choose a carpet design that does not hide the edge of steps on the stairway.  Check any carpeting to make sure that it is firmly attached to the stairs. Fix any carpet that is loose or worn. What can I do on the outside of my home?  Use bright outdoor lighting.  Regularly repair the edges of walkways and driveways and fix any cracks.  Remove high doorway thresholds.  Trim any shrubbery on the main path into your  home.  Regularly check that handrails are securely fastened and in good repair. Both sides of any steps should have handrails.  Install guardrails along the edges of any raised decks or porches.  Clear walkways of debris and clutter, including tools and rocks.  Have leaves, snow, and ice cleared regularly.  Use sand or salt on walkways during winter months.  In the garage, clean up any spills right away, including grease or oil spills. What other actions can I take?  Wear closed-toe shoes that fit well and support your feet. Wear shoes that have rubber soles or low heels.  Use mobility aids as needed, such as canes, walkers, scooters, and crutches.  Review your medicines with your health care provider. Some medicines can cause dizziness or changes in blood pressure, which increase your risk of falling. Talk with your health care provider about other ways that you can decrease your risk of falls. This may include working with a physical therapist or trainer to improve your strength, balance, and endurance. Where to find more information  Centers for Disease Control and Prevention, [facility]: WebmailGuide.co.za  [employer] on Aging: BrainJudge.co.uk Contact a health care provider if:  You are afraid of falling at home.  You feel weak, drowsy, or dizzy at home.  You fall at home. Summary  There are many simple things that you can do to make your home safe and to help prevent falls.  Ways to make your home safe include removing tripping hazards and installing grab bars in the bathroom.  Ask for help when making these changes in your home. This information is not intended to replace advice given to you by your health care provider. Make sure you discuss any questions you have with your health care provider. Document Revised: 01/23/2017 Document Reviewed: 09/25/2016 Elsevier Patient Education  [employer].   If you have lab work done today you will be  contacted with your lab results within the next 2 weeks.  If you have not heard from [name] then please contact us. The fastest way to get your results is to register for My Chart.   IF you received an x-ray today, you will receive an invoice from [facility]. Please contact [facility] at [phone] with questions or concerns regarding your invoice.   IF you received labwork today, you will receive an invoice from [facility]. Please contact [facility] at [phone] with questions or concerns regarding your invoice.   Our billing staff will not be able to assist you with questions regarding bills from these companies.  You will be contacted with  the lab results as soon as they are available. The fastest way to get your results is to activate your My Chart account. Instructions are located on the last page of this paperwork. If you have not heard from [name] regarding the results in 2 weeks, please contact this office.

## 2019-09-29 LAB — CK: Total CK: 85 U/L (ref 41–331)

## 2019-09-29 LAB — BASIC METABOLIC PANEL
BUN/Creatinine Ratio: 17 (ref 10–24)
BUN: 22 mg/dL (ref 8–27)
CO2: 23 mmol/L (ref 20–29)
Calcium: 9.7 mg/dL (ref 8.6–10.2)
Chloride: 104 mmol/L (ref 96–106)
Creatinine, Ser: 1.28 mg/dL — ABNORMAL HIGH (ref 0.76–1.27)
GFR calc Af Amer: 67 mL/min/{1.73_m2} (ref >59)
GFR calc non Af Amer: 58 mL/min/{1.73_m2} — ABNORMAL LOW (ref >59)
Glucose: 158 mg/dL — ABNORMAL HIGH (ref 65–99)
Potassium: 4.2 mmol/L (ref 3.5–5.2)
Sodium: 142 mmol/L (ref 134–144)

## 2019-10-03 ENCOUNTER — Encounter: Payer: Self-pay | Admitting: Family Medicine

## 2019-10-11 ENCOUNTER — Ambulatory Visit: Payer: PPO | Admitting: Orthopaedic Surgery

## 2019-10-18 ENCOUNTER — Ambulatory Visit: Payer: PPO | Admitting: Orthopaedic Surgery

## 2019-10-18 DIAGNOSIS — H10502 Unspecified blepharoconjunctivitis, left eye: Secondary | ICD-10-CM | POA: Diagnosis not present

## 2019-10-21 ENCOUNTER — Other Ambulatory Visit: Payer: Self-pay | Admitting: Endocrinology

## 2019-10-21 ENCOUNTER — Other Ambulatory Visit: Payer: Self-pay | Admitting: Family Medicine

## 2019-10-21 ENCOUNTER — Other Ambulatory Visit: Payer: Self-pay | Admitting: Internal Medicine

## 2019-10-21 DIAGNOSIS — B20 Human immunodeficiency virus [HIV] disease: Secondary | ICD-10-CM

## 2019-10-21 DIAGNOSIS — G8929 Other chronic pain: Secondary | ICD-10-CM

## 2019-10-26 ENCOUNTER — Other Ambulatory Visit: Payer: Self-pay

## 2019-10-26 ENCOUNTER — Ambulatory Visit: Payer: PPO | Admitting: Endocrinology

## 2019-10-26 ENCOUNTER — Ambulatory Visit (INDEPENDENT_AMBULATORY_CARE_PROVIDER_SITE_OTHER): Payer: PPO

## 2019-10-26 ENCOUNTER — Encounter: Payer: Self-pay | Admitting: Family Medicine

## 2019-10-26 ENCOUNTER — Ambulatory Visit (INDEPENDENT_AMBULATORY_CARE_PROVIDER_SITE_OTHER): Payer: PPO | Admitting: Family Medicine

## 2019-10-26 VITALS — BP 116/75 | HR 87 | Temp 98.7°F | Ht 71.0 in | Wt 251.0 lb

## 2019-10-26 DIAGNOSIS — Z9181 History of falling: Secondary | ICD-10-CM

## 2019-10-26 DIAGNOSIS — M5441 Lumbago with sciatica, right side: Secondary | ICD-10-CM

## 2019-10-26 DIAGNOSIS — M792 Neuralgia and neuritis, unspecified: Secondary | ICD-10-CM | POA: Diagnosis not present

## 2019-10-26 DIAGNOSIS — Z23 Encounter for immunization: Secondary | ICD-10-CM | POA: Diagnosis not present

## 2019-10-26 DIAGNOSIS — M545 Low back pain: Secondary | ICD-10-CM | POA: Diagnosis not present

## 2019-10-26 NOTE — Progress Notes (Signed)
Subjective:  Patient ID: Daniel [name], male    DOB: 05-17-1955  Age: 64 y.o. MRN: [id number]  CC:  Chief Complaint  Patient presents with  . Fatigue    Pt reports his Legs have been weak the past 2-3 weeks ago. pt reports he has has almost fallen or fell due to the weakness. pt reports he has a hard time walkinf up stairs and his bed room is on the second floor of his house.  . Leg Pain    Pt reports when he sits in a chair for to long the pt will get a pain from the side of his buttocks and it shoots down his leg to the back of the R knee.    HPI Daniel [name] [facility] presents for   Leg pain, weakness: He was seen August 4 for hospital follow-up after acute encephalopathy, generalized weakness and UTI.  Thought to have some multifactorial contributors of generalized weakness and falling including polypharmacy along with UTI.  CT head without evidence of acute intracranial abnormality at that time, normal B12, negative EEG and neuro was consulted, thought to have symptoms triggered by UTI.  Chronic pain in knees and legs.  Plan to follow-up with [name] for injections.  Reports increased weakness of both legs past 2 to 3 weeks.  Has had a few episodes of where he nearly fell due to the weakness, and having hard time walking upstairs as the bedroom is on the second floor of his house.  He also notes that he will get a pain from the side of his body shooting down the back to the right knee with sitting if in chair too long. Sciatica symptoms were discussed July 1. He takes Lyrica 300 mg twice daily.  I did refer him to a spine specialist July 1 visit for persistent low back pain.  He unfortunately did not try physical therapy at that time, but was referred to decide on advanced imaging versus another trial of PT versus other treatment options.  Lumbar spine imaging in March with degenerative changes in lumbar spine without acute abnormality.  He did have similar appearance of the lower lumbar facet  hypertrophy and degenerative disc disease at L5-S1 from 2019 radiographs.   Referral notes reviewed, message was left for patient to return call and schedule appointment Has appt July 7 with [facility] -  Dr. Lorin Mercy.  Slipped in shower this morning, has shower chair, caught on curtain, no fall or injury.  Fell down 3 steps into yard few weeks ago - off balance. Not using cane or assistive device. R side of back sore after that fall for a few mins only.   Some days harder to walk up stairs than others. Fatigue. 13-15 steps to bedroom. Has to take 1 step at a time. Feels safe at home. Has a walker, not using, has canes as well - not using them.  Now agreeable to physical therapy (previously refused).  Has appt with Dr. Lorin Mercy on 9/7.  No bowel or bladder incontinence, no saddle anesthesia, no persistent  lower extremity weakness - some days work better than others.  Diclofenac gel for leg pain 1-2 times per night helps.   Had covid vaccine at [place] 2 months ago - Wynetta Emery and [place].   History Patient Active Problem List   Diagnosis Date Noted  . Acute encephalopathy   . Generalized weakness 09/14/2019  . Severe major depression ([place]) 11/17/2018  . Class 2 obesity due to  excess calories without serious comorbidity with body mass index (BMI) of 36.0 to 36.9 in adult 11/17/2018  . Diabetes ([place]) 11/09/2018  . Anticoagulated 08/24/2018  . Dyslipidemia 07/27/2018  . Essential hypertension 06/25/2018  . Hypogonadism in male 05/10/2018  . Numbness 03/31/2018  . Diabetic peripheral neuropathy ([place]) 01/25/2018  . History of syncope 01/25/2018  . S/P transmetatarsal amputation of foot, right ([place]) 10/09/2017  . Osteomyelitis ([place])   . Hypothyroidism   . Constipation   . History of DVT (deep vein thrombosis) 09/30/2017  . Obesity, Class III, BMI 40-49.9 (morbid obesity) ([place]) 01/15/2017  . DOE (dyspnea on exertion) 01/13/2017  . Chronic migraine 09/17/2016  . Gait abnormality  09/17/2016  . Diabetic foot infection ([place]) 06/28/2016  . Fall 12/05/2015  . Toe ulcer, right ([place]) 09/19/2015  . Decreased pedal pulses 09/19/2015  . OSA on CPAP 09/05/2015  . Major depressive disorder, recurrent episode, moderate ([place]) 09/05/2015  . Low back pain 06/11/2015  . Abnormality of gait 06/11/2015  . Dizziness 03/17/2015  . Weakness 02/21/2015  . Chronic renal insufficiency, stage III (moderate) ([place]) 08/09/2014  . Insulin-requiring or dependent type II diabetes mellitus ([place]) 11/07/2013  . Hematuria 06/21/2013  . Hepatic steatosis 09/09/2010  . Urinary tract infection without hematuria 09/15/2006  . Human immunodeficiency virus (HIV) disease ([place]) 06/04/2006  . HERPES ZOSTER, UNCOMPLICATED 78/93/8101  . Depression 06/04/2006  . THROMBOPHLEBITIS NOS 06/04/2006  . GERD 06/04/2006  . ARTHRITIS, HAND 06/04/2006   Past Medical History:  Diagnosis Date  . ADHD (attention deficit hyperactivity disorder)   . Anxiety   . Chronic kidney disease   . Clotting disorder ([place])   . Depression   . Diabetes mellitus without complication ([place])   . Diabetes mellitus, type II ([place])   . Dizziness 03/17/2015  . Essential hypertension 06/25/2018  . GERD (gastroesophageal reflux disease)   . HIV disease ([place])   . HIV infection ([place])   . Liver disease   . OSA (obstructive sleep apnea) 07/25/2015   Uses CPAP regularly  . Peripheral vascular disease ([place])   . Ulcer    Past Surgical History:  Procedure Laterality Date  . AMPUTATION Right 10/02/2017   Procedure: RIGHT TRANSMETATARSAL AMPUTATION;  Surgeon: Leandrew Koyanagi, MD;  Location: [place];  Service: Orthopedics;  Laterality: Right;  . SMALL INTESTINE SURGERY    . STOMACH SURGERY    . TOE AMPUTATION Right 08/2016   right great toe   Allergies  Allergen Reactions  . Aspirin Swelling  . Ibuprofen Swelling  . Sustiva [Efavirenz] Swelling and Rash  . Nsaids Other (See Comments)    unknwn  . Lipitor [Atorvastatin Calcium] Other (See  Comments)    Leg pain   Prior to Admission medications   Medication Sig Start Date End Date Taking? Authorizing Provider  abacavir-dolutegravir-lamiVUDine (TRIUMEQ) 600-50-300 MG tablet Take 1 tablet by mouth daily. Patient taking differently: Take 1 tablet by mouth at bedtime.  09/13/19  Yes Golden Circle, FNP  acetaminophen (TYLENOL) 500 MG tablet Take 1,500 mg by mouth in the morning and at bedtime.    Yes [provider]  ALPRAZolam Duanne Moron) 1 MG tablet Take 1 mg by mouth at bedtime. *May take one tablet up to 4 times daily as needed for anxiety   Yes [provider]  amphetamine-dextroamphetamine (ADDERALL) 30 MG tablet Take 30 mg by mouth 3 (three) times daily.   Yes [provider]  Brexpiprazole (REXULTI) 1 MG TABS Take 1 mg by mouth at bedtime.  Yes [provider]  canagliflozin (INVOKANA) 100 MG TABS tablet Take 1 tablet (100 mg total) by mouth daily before breakfast. 02/01/19  Yes Renato Shin, MD  Continuous Blood Gluc Sensor (FREESTYLE LIBRE 14 DAY SENSOR) MISC USE AS DIRECTED EVERY 14 DAYS 06/06/19  Yes Renato Shin, MD  diclofenac Sodium (VOLTAREN) 1 % GEL APPLY 2 GRAMS TOPICALLY TO EACH KNEE IN THE MORNING AND AT BEDTIME- APPLY 1 GRAM TO EACH KNEE IN THE AFTERNOON 10/21/19  Yes [name], MD  divalproex (DEPAKOTE ER) 500 MG 24 hr tablet TAKE 1 TABLET(500 MG) BY MOUTH AT BEDTIME 09/27/19  Yes [name], MD  Dulaglutide (TRULICITY) 3 RA/0.7MA SOPN Inject 0.5 mLs (3 mg total) into the skin once a week. Patient taking differently: Inject 3 mg into the skin every Saturday.  08/24/19  Yes Renato Shin, MD  ezetimibe (ZETIA) 10 MG tablet TAKE 1 TABLET(10 MG) BY MOUTH DAILY 09/26/19  Yes Skeet Latch, MD  furosemide (LASIX) 40 MG tablet TAKE 1 TABLET(40 MG) BY MOUTH DAILY 09/26/19  Yes Skeet Latch, MD  Insulin Aspart FlexPen 100 UNIT/ML SOPN 15 units with breakfast, and 25 unit with supper 10/21/19  Yes Renato Shin, MD  insulin  glargine (LANTUS SOLOSTAR) 100 UNIT/ML Solostar Pen Inject 130 Units into the skin every morning. Patient taking differently: Inject 150 Units into the skin every morning.  08/24/19  Yes Renato Shin, MD  Insulin Pen Needle (B-D ULTRAFINE III SHORT PEN) 31G X 8 MM MISC 1 each by Other route 3 (three) times daily. E11.9 08/30/19  Yes Renato Shin, MD  levothyroxine (SYNTHROID, LEVOTHROID) 50 MCG tablet Take 1 tablet (50 mcg total) by mouth at bedtime. 07/24/15  Yes [name], MD  ondansetron (ZOFRAN) 8 MG tablet TAKE 1 TABLET(8 MG) BY MOUTH EVERY 8 HOURS AS NEEDED FOR NAUSEA OR VOMITING 10/21/19  Yes Comer, Okey Regal, MD  pantoprazole (PROTONIX) 40 MG tablet TAKE 1 TABLET(40 MG) BY MOUTH DAILY AT 6 AM Patient taking differently: Take 40 mg by mouth every evening.  05/19/19  Yes [name], MD  polyethylene glycol (MIRALAX / GLYCOLAX) packet Take 17 g by mouth 2 (two) times daily. Patient taking differently: Take 17 g by mouth 2 (two) times daily as needed for mild constipation or moderate constipation.  10/05/17  Yes Eugenie Filler, MD  pregabalin (LYRICA) 150 MG capsule Take 2 capsules (300 mg total) by mouth 2 (two) times daily. 05/19/19  Yes [name], MD  protriptyline (VIVACTIL) 10 MG tablet Take 10 mg by mouth 3 (three) times daily.  01/30/16  Yes [provider]  rivaroxaban (XARELTO) 20 MG TABS tablet TAKE 1 TABLET(20 MG) BY MOUTH DAILY Patient taking differently: Take 20 mg by mouth daily with supper.  05/19/19  Yes [name], MD  TRINTELLIX 20 MG TABS tablet Take 20 mg by mouth at bedtime.  01/26/15  Yes [provider]  zolpidem (AMBIEN) 10 MG tablet Take 10 mg by mouth at bedtime.    Yes [provider]   Social History   Socioeconomic History  . Marital status: Single    Spouse name: Not on file  . Number of children: Not on file  . Years of education: Not on file  . Highest education level: Not on file  Occupational History     Comment: DISABILITY  Tobacco Use  . Smoking status: Former Smoker    Packs/day: 0.10    Years: 10.00    Pack  years: 1.00    Types: Cigars    Quit date: 08/09/2014    Years since quitting: 5.2  . Smokeless tobacco: Never Used  Vaping Use  . Vaping Use: Never used  Substance and Sexual Activity  . Alcohol use: No    Alcohol/week: 0.0 standard drinks  . Drug use: No  . Sexual activity: Not Currently    Partners: Male    Comment: pt. declined condoms  Other Topics Concern  . Not on file  Social History Narrative   Epworth Sleepiness Scale = 7 (as of 03/16/2015)   Social Determinants of Health   Financial Resource Strain:   . Difficulty of Paying Living Expenses: Not on file  Food Insecurity:   . Worried About Charity fundraiser in the Last Year: Not on file  . Ran Out of Food in the Last Year: Not on file  Transportation Needs:   . Lack of Transportation (Medical): Not on file  . Lack of Transportation (Non-Medical): Not on file  Physical Activity:   . Days of Exercise per Week: Not on file  . Minutes of Exercise per Session: Not on file  Stress:   . Feeling of Stress : Not on file  Social Connections:   . Frequency of Communication with Friends and Family: Not on file  . Frequency of Social Gatherings with Friends and Family: Not on file  . Attends Religious Services: Not on file  . Active Member of Clubs or Organizations: Not on file  . Attends Archivist Meetings: Not on file  . Marital Status: Not on file  Intimate Partner Violence:   . Fear of Current or Ex-Partner: Not on file  . Emotionally Abused: Not on file  . Physically Abused: Not on file  . Sexually Abused: Not on file    Review of Systems  Per HPI.  Objective:   Vitals:   10/26/19 1603  BP: 116/75  Pulse: 87  Temp: 98.7 F (37.1 C)  TempSrc: Temporal  SpO2: 98%  Weight: 251 lb (113.9 kg)  Height: 5' 11"  (1.803 m)     Physical Exam Vitals reviewed.  Constitutional:       General: He is not in acute distress.    Appearance: He is well-developed. He is not ill-appearing or toxic-appearing.  HENT:     Head: Normocephalic and atraumatic.  Cardiovascular:     Rate and Rhythm: Normal rate.  Pulmonary:     Effort: Pulmonary effort is normal.  Musculoskeletal:     Comments: Lumbar spine No focal midline bony tenderness, no paraspinal tenderness on exam, describes previous area pain at the right paraspinals. Flexion approximately 90 degrees, decreased extension, lateral flexion, rotation with slight wavering of balance with rotation, but self corrects.  He is able ambulate without assistance, but slow gait.  Difficulty with heel or toe walking with balance. Difficulty with eliciting patella or Achilles reflexes bilaterally.  Negative seated straight leg raise bilaterally.   Neurological:     Mental Status: He is alert and oriented to person, place, and time.    35 minutes spent during visit, greater than 50% counseling and assimilation of information, chart review, and discussion of plan.   DG Lumbar Spine Complete  Result Date: 10/26/2019 CLINICAL DATA:  Low back pain with sciatica EXAM: LUMBAR SPINE - COMPLETE 4+ VIEW COMPARISON:  05/19/2019 FINDINGS: Lumbar alignment is normal. Vertebral body heights are maintained. Minimal disc space narrowing at L5-S1. Small anterior osteophytes at multiple levels. Facet degenerative  changes of the lower lumbar spine. Aortic atherosclerosis. IMPRESSION: Mild degenerative changes. No acute osseous abnormality. Electronically Signed   By: Donavan Foil M.D.   On: 10/26/2019 17:33     Assessment & Plan:  JOEVANNI RODDEY is a 64 y.o. male . Right-sided low back pain with right-sided sciatica, unspecified chronicity - Plan: DG Lumbar Spine Complete Personal history of fall Peripheral neuropathic pain  -Degenerative disc disease with sciatica.  Has upcoming visit with orthopedics.  Can decide on physical therapy versus advanced  imaging at that time.  No additional medication started at this time, will continue Lyrica same doses.  -Will need to be cautious with other medications given his peripheral neuropathy, some balance/unsteadiness issues, and concern for polypharmacy at recent hospitalization..  I did recommend he use assistive device at home, has a cane and walker.  Handout given on fall prevention at home.  Need for influenza vaccination - Plan: Flu Vaccine QUAD High Dose(Fluad)   No orders of the defined types were placed in this encounter.  Patient Instructions    Keep follow up with Dr. Lorin Mercy in 6 days for your back and leg symptoms. I suspect physical therpay may be recommended but he can also decide if other imaging needed.   I do recommend a cane or walker for assistance to lessen risk of falls.   Return to the clinic or go to the nearest emergency room if any of your symptoms worsen or new symptoms occur.   Chronic Back Pain When back pain lasts longer than 3 months, it is called chronic back pain.The cause of your back pain may not be known. Some common causes include:  Wear and tear (degenerative disease) of the bones, ligaments, or disks in your back.  Inflammation and stiffness in your back (arthritis). People who have chronic back pain often go through certain periods in which the pain is more intense (flare-ups). Many people can learn to manage the pain with home care. Follow these instructions at home: Pay attention to any changes in your symptoms. Take these actions to help with your pain: Activity   Avoid bending and other activities that make the problem worse.  Maintain a proper position when standing or sitting: ? When standing, keep your upper back and neck straight, with your shoulders pulled back. Avoid slouching. ? When sitting, keep your back straight and relax your shoulders. Do not round your shoulders or pull them backward.  Do not sit or stand in one place for long  periods of time.  Take brief periods of rest throughout the day. This will reduce your pain. Resting in a lying or standing position is usually better than sitting to rest.  When you are resting for longer periods, mix in some mild activity or stretching between periods of rest. This will help to prevent stiffness and pain.  Get regular exercise. Ask your health care provider what activities are safe for you.  Do not lift anything that is heavier than 10 lb (4.5 kg). Always use proper lifting technique, which includes: ? Bending your knees. ? Keeping the load close to your body. ? Avoiding twisting.  Sleep on a firm mattress in a comfortable position. Try lying on your side with your knees slightly bent. If you lie on your back, put a pillow under your knees. Managing pain  If directed, apply ice to the painful area. Your health care provider may recommend applying ice during the first 24-48 hours after a flare-up begins. ?  Put ice in a plastic bag. ? Place a towel between your skin and the bag. ? Leave the ice on for 20 minutes, 2-3 times per day.  If directed, apply heat to the affected area as often as told by your health care provider. Use the heat source that your health care provider recommends, such as a moist heat pack or a heating pad. ? Place a towel between your skin and the heat source. ? Leave the heat on for 20-30 minutes. ? Remove the heat if your skin turns bright red. This is especially important if you are unable to feel pain, heat, or cold. You may have a greater risk of getting burned.  Try soaking in a warm tub.  Take over-the-counter and prescription medicines only as told by your health care provider.  Keep all follow-up visits as told by your health care provider. This is important. Contact a health care provider if:  You have pain that is not relieved with rest or medicine. Get help right away if:  You have weakness or numbness in one or both of your legs  or feet.  You have trouble controlling your bladder or your bowels.  You have nausea or vomiting.  You have pain in your abdomen.  You have shortness of breath or you faint. This information is not intended to replace advice given to you by your health care provider. Make sure you discuss any questions you have with your health care provider. Document Revised: 06/03/2018 Document Reviewed: 08/20/2016 Elsevier Patient Education  [place] Prevention in the Home, Adult Falls can cause injuries and can affect people from all age groups. There are many simple things that you can do to make your home safe and to help prevent falls. Ask for help when making these changes, if needed. What actions can I take to prevent falls? General instructions  Use good lighting in all rooms. Replace any light bulbs that burn out.  Turn on lights if it is dark. Use night-lights.  Place frequently used items in easy-to-reach places. Lower the shelves around your home if necessary.  Set up furniture so that there are clear paths around it. Avoid moving your furniture around.  Remove throw rugs and other tripping hazards from the floor.  Avoid walking on wet floors.  Fix any uneven floor surfaces.  Add color or contrast paint or tape to grab bars and handrails in your home. Place contrasting color strips on the first and last steps of stairways.  When you use a stepladder, make sure that it is completely opened and that the sides are firmly locked. Have someone hold the ladder while you are using it. Do not climb a closed stepladder.  Be aware of any and all pets. What can I do in the bathroom?      Keep the floor dry. Immediately clean up any water that spills onto the floor.  Remove soap buildup in the tub or shower on a regular basis.  Use non-skid mats or decals on the floor of the tub or shower.  Attach bath mats securely with double-sided, non-slip rug tape.  If you  need to sit down while you are in the shower, use a plastic, non-slip stool.  Install grab bars by the toilet and in the tub and shower. Do not use towel bars as grab bars. What can I do in the bedroom?  Make sure that a bedside light is easy to reach.  Do  not use oversized bedding that drapes onto the floor.  Have a firm chair that has side arms to use for getting dressed. What can I do in the kitchen?  Clean up any spills right away.  If you need to reach for something above you, use a sturdy step stool that has a grab bar.  Keep electrical cables out of the way.  Do not use floor polish or wax that makes floors slippery. If you must use wax, make sure that it is non-skid floor wax. What can I do in the stairways?  Do not leave any items on the stairs.  Make sure that you have a light switch at the top of the stairs and the bottom of the stairs. Have them installed if you do not have them.  Make sure that there are handrails on both sides of the stairs. Fix handrails that are broken or loose. Make sure that handrails are as long as the stairways.  Install non-slip stair treads on all stairs in your home.  Avoid having throw rugs at the top or bottom of stairways, or secure the rugs with carpet tape to prevent them from moving.  Choose a carpet design that does not hide the edge of steps on the stairway.  Check any carpeting to make sure that it is firmly attached to the stairs. Fix any carpet that is loose or worn. What can I do on the outside of my home?  Use bright outdoor lighting.  Regularly repair the edges of walkways and driveways and fix any cracks.  Remove high doorway thresholds.  Trim any shrubbery on the main path into your home.  Regularly check that handrails are securely fastened and in good repair. Both sides of any steps should have handrails.  Install guardrails along the edges of any raised decks or porches.  Clear walkways of debris and clutter,  including tools and rocks.  Have leaves, snow, and ice cleared regularly.  Use sand or salt on walkways during winter months.  In the garage, clean up any spills right away, including grease or oil spills. What other actions can I take?  Wear closed-toe shoes that fit well and support your feet. Wear shoes that have rubber soles or low heels.  Use mobility aids as needed, such as canes, walkers, scooters, and crutches.  Review your medicines with your health care provider. Some medicines can cause dizziness or changes in blood pressure, which increase your risk of falling. Talk with your health care provider about other ways that you can decrease your risk of falls. This may include working with a physical therapist or trainer to improve your strength, balance, and endurance. Where to find more information  Centers for Disease Control and Prevention, [place]: WebmailGuide.co.za  [employer] on Aging: BrainJudge.co.uk Contact a health care provider if:  You are afraid of falling at home.  You feel weak, drowsy, or dizzy at home.  You fall at home. Summary  There are many simple things that you can do to make your home safe and to help prevent falls.  Ways to make your home safe include removing tripping hazards and installing grab bars in the bathroom.  Ask for help when making these changes in your home. This information is not intended to replace advice given to you by your health care provider. Make sure you discuss any questions you have with your health care provider. Document Revised: 01/23/2017 Document Reviewed: 09/25/2016 Elsevier Patient Education  2020 [employer].  If you have lab work done today you will be contacted with your lab results within the next 2 weeks.  If you have not heard from [name] then please contact us. The fastest way to get your results is to register for My Chart.   IF you received an x-ray today, you will receive an invoice  from [facility]. Please contact [facility] at [phone] with questions or concerns regarding your invoice.   IF you received labwork today, you will receive an invoice from [facility]. Please contact [facility] at [phone] with questions or concerns regarding your invoice.   Our billing staff will not be able to assist you with questions regarding bills from these companies.  You will be contacted with the lab results as soon as they are available. The fastest way to get your results is to activate your My Chart account. Instructions are located on the last page of this paperwork. If you have not heard from [name] regarding the results in 2 weeks, please contact this office.         Signed, [name], MD [facility]

## 2019-10-26 NOTE — Patient Instructions (Addendum)
Keep follow up with Dr. Lorin Mercy in 6 days for your back and leg symptoms. I suspect physical therpay may be recommended but he can also decide if other imaging needed.   I do recommend a cane or walker for assistance to lessen risk of falls.   Return to the clinic or go to the nearest emergency room if any of your symptoms worsen or new symptoms occur.   Chronic Back Pain When back pain lasts longer than 3 months, it is called chronic back pain.The cause of your back pain may not be known. Some common causes include:  Wear and tear (degenerative disease) of the bones, ligaments, or disks in your back.  Inflammation and stiffness in your back (arthritis). People who have chronic back pain often go through certain periods in which the pain is more intense (flare-ups). Many people can learn to manage the pain with home care. Follow these instructions at home: Pay attention to any changes in your symptoms. Take these actions to help with your pain: Activity   Avoid bending and other activities that make the problem worse.  Maintain a proper position when standing or sitting: ? When standing, keep your upper back and neck straight, with your shoulders pulled back. Avoid slouching. ? When sitting, keep your back straight and relax your shoulders. Do not round your shoulders or pull them backward.  Do not sit or stand in one place for long periods of time.  Take brief periods of rest throughout the day. This will reduce your pain. Resting in a lying or standing position is usually better than sitting to rest.  When you are resting for longer periods, mix in some mild activity or stretching between periods of rest. This will help to prevent stiffness and pain.  Get regular exercise. Ask your health care provider what activities are safe for you.  Do not lift anything that is heavier than 10 lb (4.5 kg). Always use proper lifting technique, which includes: ? Bending your knees. ? Keeping  the load close to your body. ? Avoiding twisting.  Sleep on a firm mattress in a comfortable position. Try lying on your side with your knees slightly bent. If you lie on your back, put a pillow under your knees. Managing pain  If directed, apply ice to the painful area. Your health care provider may recommend applying ice during the first 24-48 hours after a flare-up begins. ? Put ice in a plastic bag. ? Place a towel between your skin and the bag. ? Leave the ice on for 20 minutes, 2-3 times per day.  If directed, apply heat to the affected area as often as told by your health care provider. Use the heat source that your health care provider recommends, such as a moist heat pack or a heating pad. ? Place a towel between your skin and the heat source. ? Leave the heat on for 20-30 minutes. ? Remove the heat if your skin turns bright red. This is especially important if you are unable to feel pain, heat, or cold. You may have a greater risk of getting burned.  Try soaking in a warm tub.  Take over-the-counter and prescription medicines only as told by your health care provider.  Keep all follow-up visits as told by your health care provider. This is important. Contact a health care provider if:  You have pain that is not relieved with rest or medicine. Get help right away if:  You have weakness or numbness in  one or both of your legs or feet.  You have trouble controlling your bladder or your bowels.  You have nausea or vomiting.  You have pain in your abdomen.  You have shortness of breath or you faint. This information is not intended to replace advice given to you by your health care provider. Make sure you discuss any questions you have with your health care provider. Document Revised: 06/03/2018 Document Reviewed: 08/20/2016 Elsevier Patient Education  [place] Prevention in the Home, Adult Falls can cause injuries and can affect people from all age  groups. There are many simple things that you can do to make your home safe and to help prevent falls. Ask for help when making these changes, if needed. What actions can I take to prevent falls? General instructions  Use good lighting in all rooms. Replace any light bulbs that burn out.  Turn on lights if it is dark. Use night-lights.  Place frequently used items in easy-to-reach places. Lower the shelves around your home if necessary.  Set up furniture so that there are clear paths around it. Avoid moving your furniture around.  Remove throw rugs and other tripping hazards from the floor.  Avoid walking on wet floors.  Fix any uneven floor surfaces.  Add color or contrast paint or tape to grab bars and handrails in your home. Place contrasting color strips on the first and last steps of stairways.  When you use a stepladder, make sure that it is completely opened and that the sides are firmly locked. Have someone hold the ladder while you are using it. Do not climb a closed stepladder.  Be aware of any and all pets. What can I do in the bathroom?      Keep the floor dry. Immediately clean up any water that spills onto the floor.  Remove soap buildup in the tub or shower on a regular basis.  Use non-skid mats or decals on the floor of the tub or shower.  Attach bath mats securely with double-sided, non-slip rug tape.  If you need to sit down while you are in the shower, use a plastic, non-slip stool.  Install grab bars by the toilet and in the tub and shower. Do not use towel bars as grab bars. What can I do in the bedroom?  Make sure that a bedside light is easy to reach.  Do not use oversized bedding that drapes onto the floor.  Have a firm chair that has side arms to use for getting dressed. What can I do in the kitchen?  Clean up any spills right away.  If you need to reach for something above you, use a sturdy step stool that has a grab bar.  Keep electrical  cables out of the way.  Do not use floor polish or wax that makes floors slippery. If you must use wax, make sure that it is non-skid floor wax. What can I do in the stairways?  Do not leave any items on the stairs.  Make sure that you have a light switch at the top of the stairs and the bottom of the stairs. Have them installed if you do not have them.  Make sure that there are handrails on both sides of the stairs. Fix handrails that are broken or loose. Make sure that handrails are as long as the stairways.  Install non-slip stair treads on all stairs in your home.  Avoid having throw rugs at the  top or bottom of stairways, or secure the rugs with carpet tape to prevent them from moving.  Choose a carpet design that does not hide the edge of steps on the stairway.  Check any carpeting to make sure that it is firmly attached to the stairs. Fix any carpet that is loose or worn. What can I do on the outside of my home?  Use bright outdoor lighting.  Regularly repair the edges of walkways and driveways and fix any cracks.  Remove high doorway thresholds.  Trim any shrubbery on the main path into your home.  Regularly check that handrails are securely fastened and in good repair. Both sides of any steps should have handrails.  Install guardrails along the edges of any raised decks or porches.  Clear walkways of debris and clutter, including tools and rocks.  Have leaves, snow, and ice cleared regularly.  Use sand or salt on walkways during winter months.  In the garage, clean up any spills right away, including grease or oil spills. What other actions can I take?  Wear closed-toe shoes that fit well and support your feet. Wear shoes that have rubber soles or low heels.  Use mobility aids as needed, such as canes, walkers, scooters, and crutches.  Review your medicines with your health care provider. Some medicines can cause dizziness or changes in blood pressure, which  increase your risk of falling. Talk with your health care provider about other ways that you can decrease your risk of falls. This may include working with a physical therapist or trainer to improve your strength, balance, and endurance. Where to find more information  Centers for Disease Control and Prevention, [facility]: WebmailGuide.co.za  [employer] on Aging: BrainJudge.co.uk Contact a health care provider if:  You are afraid of falling at home.  You feel weak, drowsy, or dizzy at home.  You fall at home. Summary  There are many simple things that you can do to make your home safe and to help prevent falls.  Ways to make your home safe include removing tripping hazards and installing grab bars in the bathroom.  Ask for help when making these changes in your home. This information is not intended to replace advice given to you by your health care provider. Make sure you discuss any questions you have with your health care provider. Document Revised: 01/23/2017 Document Reviewed: 09/25/2016 Elsevier Patient Education  [employer].   If you have lab work done today you will be contacted with your lab results within the next 2 weeks.  If you have not heard from [name] then please contact us. The fastest way to get your results is to register for My Chart.   IF you received an x-ray today, you will receive an invoice from [facility]. Please contact [facility] at [phone] with questions or concerns regarding your invoice.   IF you received labwork today, you will receive an invoice from [facility]. Please contact [facility] at [phone] with questions or concerns regarding your invoice.   Our billing staff will not be able to assist you with questions regarding bills from these companies.  You will be contacted with the lab results as soon as they are available. The fastest way to get your results is to activate your My Chart account.  Instructions are located on the last page of this paperwork. If you have not heard from [name] regarding the results in 2 weeks, please contact this office.

## 2019-10-28 ENCOUNTER — Ambulatory Visit: Payer: PPO | Admitting: Endocrinology

## 2019-11-01 ENCOUNTER — Ambulatory Visit: Payer: PPO | Admitting: Orthopaedic Surgery

## 2019-11-01 DIAGNOSIS — M545 Low back pain, unspecified: Secondary | ICD-10-CM

## 2019-11-01 NOTE — Progress Notes (Signed)
Office Visit Note   Patient: Daniel [name]           Date of Birth: [birth year]           MRN: [id number] Visit Date: 11/01/2019              Requested by: [name], MD [address] [place],  [place] [address] PCP: [name], MD   Assessment & Plan: Visit Diagnoses:  1. Low back pain, unspecified back pain laterality, unspecified chronicity, unspecified whether sciatica present     Plan: We will proceed with formal physical therapy for lower extremity strengthening chronic low back pain with history of falls.  I will plan to recheck him again in 5 weeks.  If he is having persistent problems will consider imaging studies.  Follow-Up Instructions: Return in about 5 weeks (around 12/06/2019).   Orders:  Orders Placed This Encounter  Procedures  . Ambulatory referral to Physical Therapy   No orders of the defined types were placed in this encounter.     Procedures: No procedures performed   Clinical Data: No additional findings.   Subjective: Chief Complaint  Patient presents with  . Lower Back - Pain    HPI 64 year old male with low back pain and right radicular leg pain times many years.  He states he hurts across lumbosacral junction he states the pain is deep.  He states is gradually been getting worse.  He has had 8 weeks of chiropractic treatment without relief.  Has not been through any formal physical therapy.  Patient does have a history of depression hepatic steatosis.  Type 2 diabetes on insulin.  Stage III kidney disease history of thrombophlebitis and GERD.  Patient's been taking Lyrica 300 mg twice a day.  Positive history of slipping in the shower week ago.  Some difficulty with stairs.  Patient is a walker but prefers to use the cane.  He is used diclofenac gel on his legs.  Denies bowel bladder symptoms no fever or chills.  Review of Systems Type II to be diabetes on insulin renal insufficiency stage III.,  PND.  Depression otherwise  noncontributory other than as mentioned in HPI.  Objective: Vital Signs: There were no vitals taken for this visit.  Physical Exam Constitutional:      Appearance: He is well-developed.  HENT:     Head: Normocephalic and atraumatic.  Eyes:     Pupils: Pupils are equal, round, and reactive to light.  Neck:     Thyroid: No thyromegaly.     Trachea: No tracheal deviation.  Cardiovascular:     Rate and Rhythm: Normal rate.  Pulmonary:     Effort: Pulmonary effort is normal.     Breath sounds: No wheezing.  Abdominal:     General: Bowel sounds are normal.     Palpations: Abdomen is soft.  Skin:    General: Skin is warm and dry.     Capillary Refill: Capillary refill takes less than 2 seconds.  Neurological:     Mental Status: He is alert and oriented to person, place, and time.  Psychiatric:        Behavior: Behavior normal.        Thought Content: Thought content normal.        Judgment: Judgment normal.     Ortho Exam patient has lumbar tenderness with midline palpation lumbosacral junction.  More tenderness on the right paralumbar than left.  Negative logroll to the hips.  He  has decreased excursion with lateral bending forward flexion and extension.  Slow deliberate gait ambulatory with his cane.  Trace knee ankle jerk.  Anterior tib is active.  Decreased pedal pulses.  Specialty Comments:  No specialty comments available.  Imaging: Narrative & Impression  CLINICAL DATA:  Low back pain with sciatica  EXAM: LUMBAR SPINE - COMPLETE 4+ VIEW  COMPARISON:  05/19/2019  FINDINGS: Lumbar alignment is normal. Vertebral body heights are maintained. Minimal disc space narrowing at L5-S1. Small anterior osteophytes at multiple levels. Facet degenerative changes of the lower lumbar spine. Aortic atherosclerosis.  IMPRESSION: Mild degenerative changes. No acute osseous abnormality.   Electronically Signed   By: Donavan Foil M.D.      PMFS History: Patient  Active Problem List   Diagnosis Date Noted  . Acute encephalopathy   . Generalized weakness 09/14/2019  . Severe major depression ([place] [place]) 11/17/2018  . Class 2 obesity due to excess calories without serious comorbidity with body mass index (BMI) of 36.0 to 36.9 in adult 11/17/2018  . Diabetes ([place]) 11/09/2018  . Anticoagulated 08/24/2018  . Dyslipidemia 07/27/2018  . Essential hypertension 06/25/2018  . Hypogonadism in male 05/10/2018  . Numbness 03/31/2018  . Diabetic peripheral neuropathy ([place]) 01/25/2018  . History of syncope 01/25/2018  . S/P transmetatarsal amputation of foot, right ([place]) 10/09/2017  . Osteomyelitis ([place])   . Hypothyroidism   . Constipation   . History of DVT (deep vein thrombosis) 09/30/2017  . Obesity, Class III, BMI 40-49.9 (morbid obesity) ([place]) 01/15/2017  . DOE (dyspnea on exertion) 01/13/2017  . Chronic migraine 09/17/2016  . Gait abnormality 09/17/2016  . Diabetic foot infection ([place]) 06/28/2016  . Fall 12/05/2015  . Toe ulcer, right ([place]) 09/19/2015  . Decreased pedal pulses 09/19/2015  . OSA on CPAP 09/05/2015  . Major depressive disorder, recurrent episode, moderate ([place]) 09/05/2015  . Low back pain 06/11/2015  . Abnormality of gait 06/11/2015  . Dizziness 03/17/2015  . Weakness 02/21/2015  . Chronic renal insufficiency, stage III (moderate) ([place]) 08/09/2014  . Insulin-requiring or dependent type II diabetes mellitus ([place]) 11/07/2013  . Hematuria 06/21/2013  . Hepatic steatosis 09/09/2010  . Urinary tract infection without hematuria 09/15/2006  . Human immunodeficiency virus (HIV) disease ([place]) 06/04/2006  . HERPES ZOSTER, UNCOMPLICATED 83/38/2505  . Depression 06/04/2006  . THROMBOPHLEBITIS NOS 06/04/2006  . GERD 06/04/2006  . ARTHRITIS, HAND 06/04/2006   Past Medical History:  Diagnosis Date  . ADHD (attention deficit hyperactivity disorder)   . Anxiety   . Chronic kidney disease   . Clotting disorder ([place])   . Depression   .  Diabetes mellitus without complication ([place])   . Diabetes mellitus, type II ([place])   . Dizziness 03/17/2015  . Essential hypertension 06/25/2018  . GERD (gastroesophageal reflux disease)   . HIV disease ([place])   . HIV infection ([place])   . Liver disease   . OSA (obstructive sleep apnea) 07/25/2015   Uses CPAP regularly  . Peripheral vascular disease ([place])   . Ulcer     Family History  Problem Relation Age of Onset  . Depression Brother   . Throat cancer Brother        half brother, never smoker  . COPD Mother   . Diabetes Neg Hx     Past Surgical History:  Procedure Laterality Date  . AMPUTATION Right 10/02/2017   Procedure: RIGHT TRANSMETATARSAL AMPUTATION;  Surgeon: Leandrew Koyanagi, MD;  Location: [facility];  Service: Orthopedics;  Laterality: Right;  .  SMALL INTESTINE SURGERY    . STOMACH SURGERY    . TOE AMPUTATION Right 08/2016   right great toe   Social History   Occupational History    Comment: DISABILITY  Tobacco Use  . Smoking status: Former Smoker    Packs/day: 0.10    Years: 10.00    Pack years: 1.00    Types: Cigars    Quit date: 08/09/2014    Years since quitting: 5.2  . Smokeless tobacco: Never Used  Vaping Use  . Vaping Use: Never used  Substance and Sexual Activity  . Alcohol use: No    Alcohol/week: 0.0 standard drinks  . Drug use: No  . Sexual activity: Not Currently    Partners: Male    Comment: pt. declined condoms

## 2019-11-03 IMAGING — MR MR HEAD WO/W CM
12 series · 48 of 48 positions shown · IV contrast (multihance)
Comparison: Brain MRI 05/13/2015

CLINICAL DATA: Subacute right facial weakness. Possible Bell's
palsy.

Creatinine was obtained on site at [HOSPITAL] at [HOSPITAL].
Results: Creatinine 1.6 mg/dL.
EXAM:
MRI HEAD WITHOUT AND WITH CONTRAST
TECHNIQUE: Multiplanar, multiecho pulse sequences of the brain and surrounding
structures were obtained without and with intravenous contrast.
CONTRAST:  20mL MULTIHANCE GADOBENATE DIMEGLUMINE 529 MG/ML IV SOLN

[Series 5: T1 · sagittal · 4.0mm · 0.75mm/px · 1 of 31 slices shown (1 of 3)]
[im 1/31]
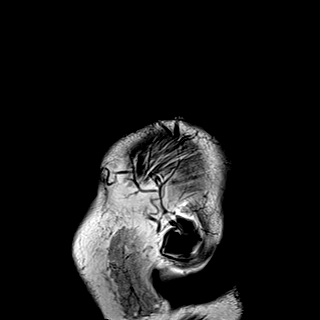

[Series 6: DWI · axial · 3.0mm · 1.50mm/px · z∈[-54,+93]mm · 6 of 92 slices shown (1 of 4)]
[im 1/92]
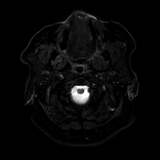
[im 19/92]
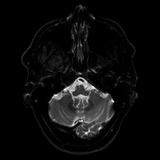
[im 37/92]
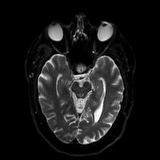
[im 55/92]
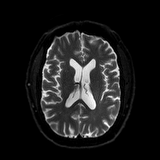
[im 73/92]
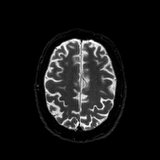
[im 92/92]
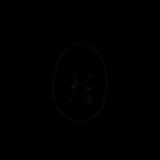

[Series 7: DWI · axial · 3.0mm · 1.50mm/px · z∈[-54,+93]mm · 3 of 45 slices shown (2 of 4)]
[im 1/45]
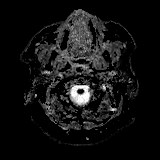
[im 23/45]
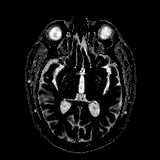
[im 45/45]
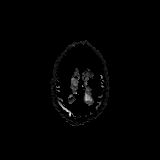

[Series 8: DWI · coronal · 5.0mm · 1.44mm/px · 4 of 66 slices shown (3 of 4)]
[im 1/66]
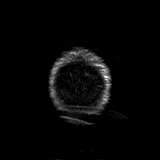
[im 22/66]
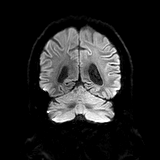
[im 44/66]
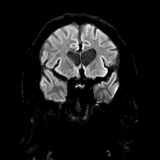
[im 66/66]
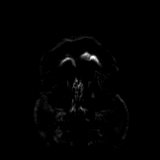

[Series 9: DWI · coronal · 5.0mm · 1.44mm/px · 2 of 33 slices shown (4 of 4)]
[im 1/33]
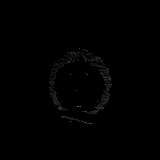
[im 33/33]
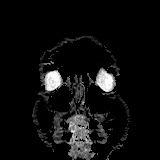

[Series 10: T2 · axial · 4.0mm · 0.38mm/px · z∈[-55,+94]mm · 2 of 30 slices shown]
[im 1/30]
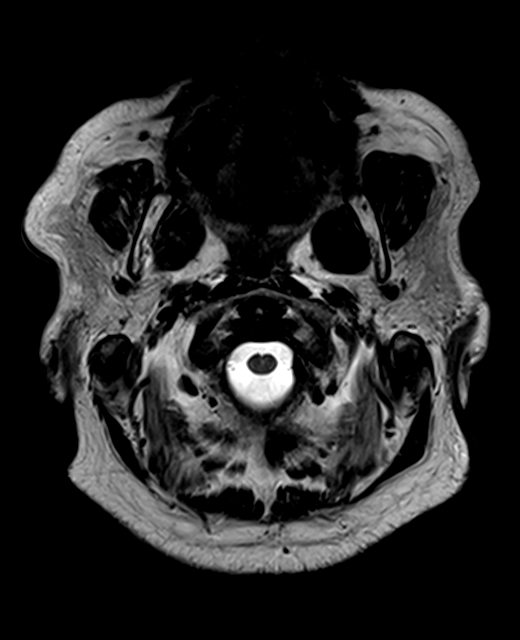
[im 30/30]
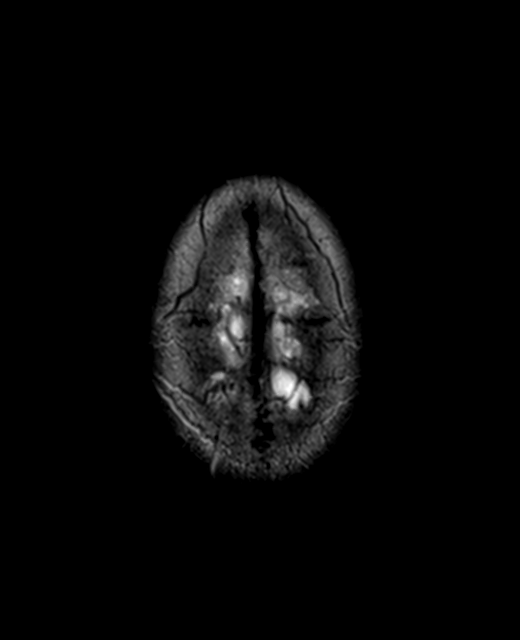

[Series 11: FLAIR · axial · 3.0mm · 0.75mm/px · z∈[-55,+94]mm · 2 of 26 slices shown]
[im 1/26]
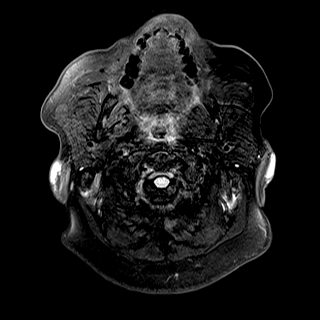
[im 26/26]
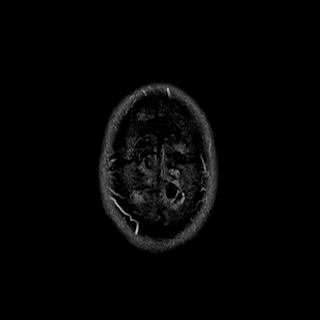

[Series 13: swi_images · axial · 1.5mm · 0.94mm/px · z∈[-51,+90]mm · 6 of 96 slices shown]
[im 1/96]
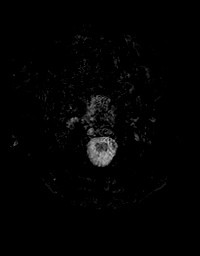
[im 20/96]
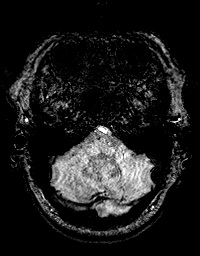
[im 39/96]
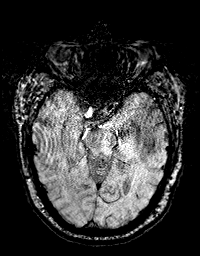
[im 58/96]
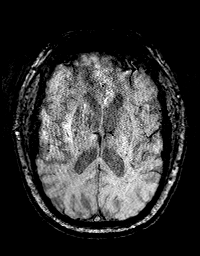
[im 77/96]
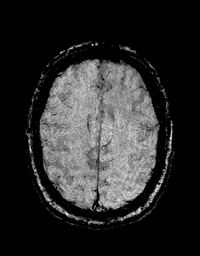
[im 96/96]
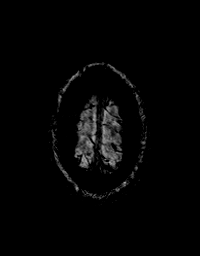

[Series 14: T1 · axial · 1.0mm · 0.94mm/px · z∈[-52,+89]mm · 9 of 144 slices shown (2 of 3)]
[im 1/144]
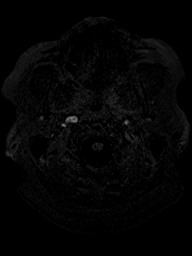
[im 18/144]
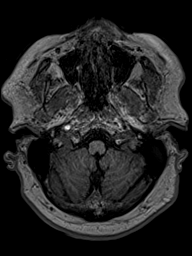
[im 36/144]
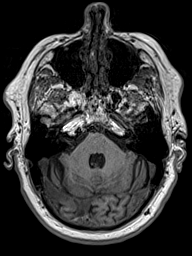
[im 54/144]
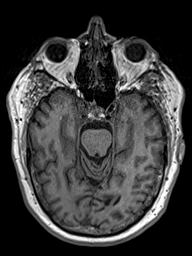
[im 72/144]
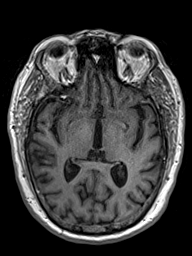
[im 90/144]
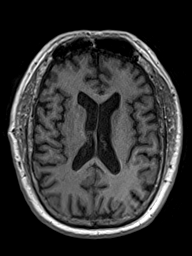
[im 108/144]
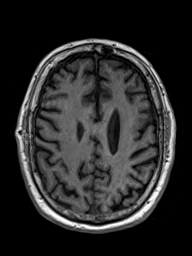
[im 126/144]
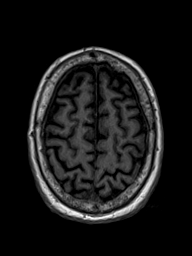
[im 144/144]
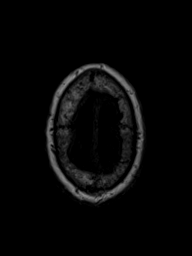

[Series 15: T2 post-contrast · coronal · 4.5mm · 0.36mm/px · 2 of 35 slices shown]
[im 1/35]
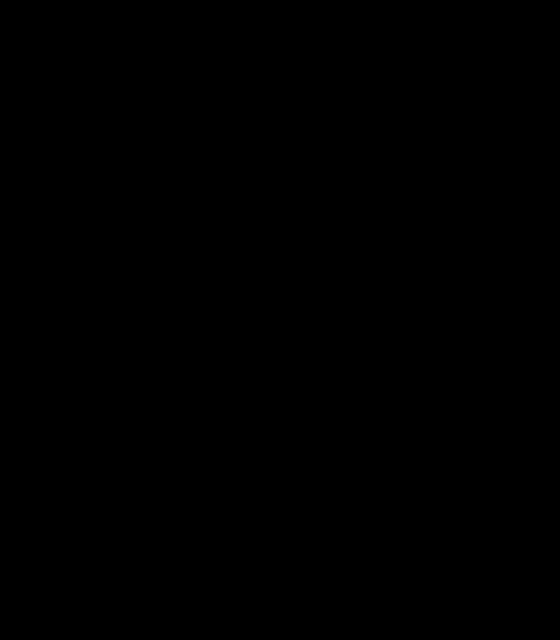
[im 35/35]
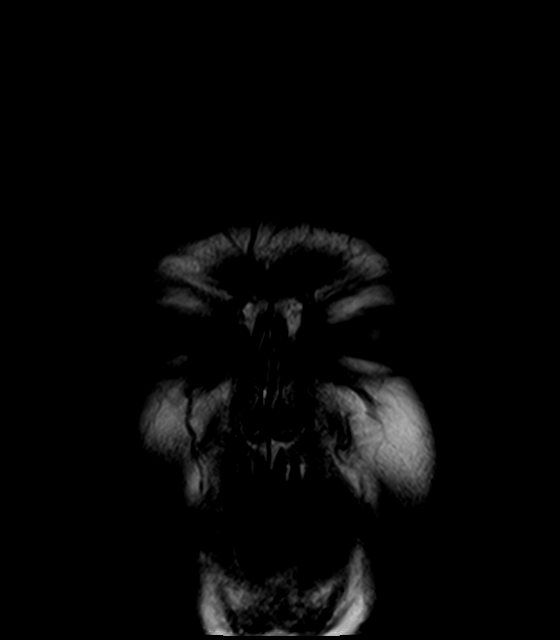

[Series 16: T1 · axial · 1.0mm · 0.94mm/px · z∈[-52,+89]mm · 9 of 144 slices shown (3 of 3)]
[im 1/144]
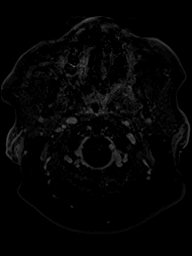
[im 18/144]
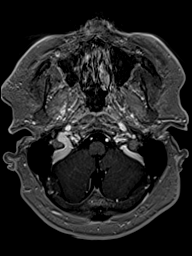
[im 36/144]
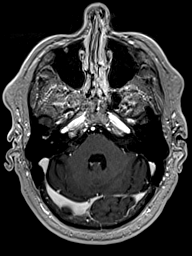
[im 54/144]
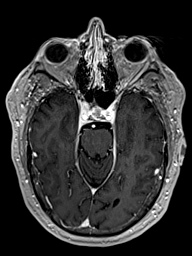
[im 72/144]
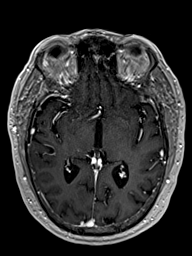
[im 90/144]
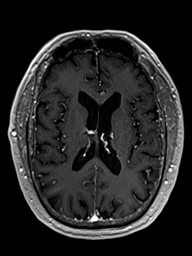
[im 108/144]
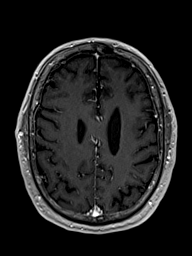
[im 126/144]
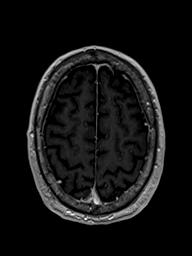
[im 144/144]
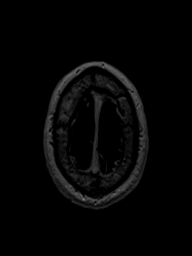

[Series 17: T1 post-contrast · coronal · 4.5mm · 0.72mm/px · 2 of 35 slices shown]
[im 1/35]
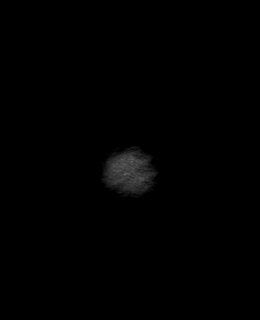
[im 35/35]
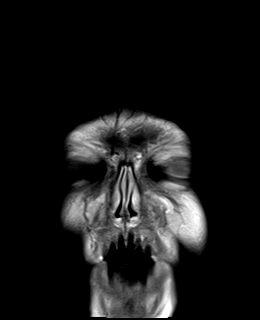

[48 of 48 positions shown; findings below may reference images not displayed]

FINDINGS: BRAIN: There is no acute infarct, acute hemorrhage or mass effect.
The midline structures are normal. There are no old unchanged
appearance of small right pontine chronic infarct.. The white matter
signal is normal for the patient's age. Generalized atrophy without
lobar predilection. Susceptibility-sensitive sequences show no
chronic microhemorrhage or superficial siderosis. No abnormal
contrast enhancement. No asymmetric facial nerve contrast
enhancement.

VASCULAR: Major intracranial arterial and venous sinus flow voids
are preserved.

SKULL AND UPPER CERVICAL SPINE: The visualized skull base,
calvarium, upper cervical spine and extracranial soft tissues are
normal.

SINUSES/ORBITS: No fluid levels or advanced mucosal thickening. No
mastoid or middle ear effusion. The orbits are normal.
IMPRESSION: 1. Old right pontine infarct and mild generalized atrophy without
acute abnormality.
2. No asymmetric facial nerve contrast-enhancement.

## 2019-11-04 ENCOUNTER — Telehealth: Payer: Self-pay | Admitting: Family Medicine

## 2019-11-04 DIAGNOSIS — M545 Low back pain: Secondary | ICD-10-CM | POA: Diagnosis not present

## 2019-11-04 NOTE — Telephone Encounter (Signed)
Called pt and resch for 11/30/19

## 2019-11-04 NOTE — Telephone Encounter (Signed)
Patient is calling back to reschedule his appt with Dr. Carlota Raspberry Please advise CB- [phone]

## 2019-11-08 ENCOUNTER — Ambulatory Visit: Payer: PPO | Admitting: Orthopaedic Surgery

## 2019-11-09 ENCOUNTER — Ambulatory Visit (INDEPENDENT_AMBULATORY_CARE_PROVIDER_SITE_OTHER): Payer: PPO | Admitting: Orthopaedic Surgery

## 2019-11-09 ENCOUNTER — Telehealth: Payer: Self-pay

## 2019-11-09 ENCOUNTER — Encounter: Payer: Self-pay | Admitting: Orthopaedic Surgery

## 2019-11-09 DIAGNOSIS — M170 Bilateral primary osteoarthritis of knee: Secondary | ICD-10-CM

## 2019-11-09 DIAGNOSIS — M1712 Unilateral primary osteoarthritis, left knee: Secondary | ICD-10-CM | POA: Diagnosis not present

## 2019-11-09 DIAGNOSIS — M1711 Unilateral primary osteoarthritis, right knee: Secondary | ICD-10-CM | POA: Diagnosis not present

## 2019-11-09 MED ORDER — HYALURONAN 88 MG/4ML IX SOSY
88.0000 mg | PREFILLED_SYRINGE | INTRA_ARTICULAR | Status: AC | PRN
  Administered 2019-11-09: 88 mg via INTRA_ARTICULAR

## 2019-11-09 MED ORDER — BUPIVACAINE HCL 0.25 % IJ SOLN
0.6600 mL | INTRAMUSCULAR | Status: AC | PRN
Start: 2019-11-09 — End: 2019-11-09
  Administered 2019-11-09: .66 mL via INTRA_ARTICULAR

## 2019-11-09 MED ORDER — LIDOCAINE HCL 1 % IJ SOLN
3.0000 mL | INTRAMUSCULAR | Status: AC | PRN
  Administered 2019-11-09: 3 mL

## 2019-11-09 MED ORDER — BUPIVACAINE HCL 0.25 % IJ SOLN
0.6600 mL | INTRAMUSCULAR | Status: AC | PRN
  Administered 2019-11-09: .66 mL via INTRA_ARTICULAR

## 2019-11-09 MED ORDER — TRAMADOL HCL 50 MG PO TABS: 50.0000 mg | ORAL_TABLET | Freq: Two times a day (BID) | ORAL | 1 refills | Status: DC | PRN

## 2019-11-09 NOTE — Telephone Encounter (Signed)
[facility] requesting medical records: [facility]  Document received: Signed medical records request  Forwarded above request to HIM. Document and fax confirmation have been placed in the faxed file for future reference.

## 2019-11-09 NOTE — Progress Notes (Signed)
   Procedure Note  Patient: Daniel [name]             Date of Birth: Jun 26, 1955           MRN: [id number]             Visit Date: 11/09/2019  Procedures: Visit Diagnoses:  1. Bilateral primary osteoarthritis of knee     Large Joint Inj: bilateral knee on 11/09/2019 10:25 AM Indications: pain Details: 22 G needle, anterolateral approach Medications (Right): 0.66 mL bupivacaine 0.25 %; 3 mL lidocaine 1 %; 88 mg Hyaluronan 88 MG/4ML Medications (Left): 0.66 mL bupivacaine 0.25 %; 3 mL lidocaine 1 %; 88 mg Hyaluronan 88 MG/4ML

## 2019-11-10 DIAGNOSIS — M545 Low back pain: Secondary | ICD-10-CM | POA: Diagnosis not present

## 2019-11-17 ENCOUNTER — Other Ambulatory Visit: Payer: Self-pay | Admitting: Endocrinology

## 2019-11-17 ENCOUNTER — Other Ambulatory Visit: Payer: Self-pay | Admitting: Family Medicine

## 2019-11-17 DIAGNOSIS — I10 Essential (primary) hypertension: Secondary | ICD-10-CM

## 2019-11-17 DIAGNOSIS — G8929 Other chronic pain: Secondary | ICD-10-CM

## 2019-11-17 DIAGNOSIS — K219 Gastro-esophageal reflux disease without esophagitis: Secondary | ICD-10-CM

## 2019-11-18 ENCOUNTER — Ambulatory Visit (INDEPENDENT_AMBULATORY_CARE_PROVIDER_SITE_OTHER): Payer: PPO | Admitting: Registered Nurse

## 2019-11-18 ENCOUNTER — Other Ambulatory Visit: Payer: Self-pay

## 2019-11-18 VITALS — BP 137/80 | HR 87 | Temp 98.3°F | Ht 71.0 in | Wt 248.6 lb

## 2019-11-18 DIAGNOSIS — H8113 Benign paroxysmal vertigo, bilateral: Secondary | ICD-10-CM | POA: Diagnosis not present

## 2019-11-18 MED ORDER — MECLIZINE HCL 12.5 MG PO TABS: 12.5000 mg | ORAL_TABLET | Freq: Two times a day (BID) | ORAL | 0 refills | Status: DC | PRN

## 2019-11-18 NOTE — Patient Instructions (Signed)
° ° ° °  If you have lab work done today you will be contacted with your lab results within the next 2 weeks.  If you have not heard from us then please contact us. The fastest way to get your results is to register for My Chart. ° ° °IF you received an x-ray today, you will receive an invoice from [facility]. Please contact [facility] at [phone] with questions or concerns regarding your invoice.  ° °IF you received labwork today, you will receive an invoice from [facility]. Please contact [facility] at [phone] with questions or concerns regarding your invoice.  ° °Our billing staff will not be able to assist you with questions regarding bills from these companies. ° °You will be contacted with the lab results as soon as they are available. The fastest way to get your results is to activate your My Chart account. Instructions are located on the last page of this paperwork. If you have not heard from us regarding the results in 2 weeks, please contact this office. °  ° ° ° °

## 2019-11-23 ENCOUNTER — Ambulatory Visit: Payer: PPO | Admitting: Cardiovascular Disease

## 2019-11-25 ENCOUNTER — Ambulatory Visit: Payer: PPO | Admitting: Family Medicine

## 2019-11-30 ENCOUNTER — Ambulatory Visit: Payer: PPO | Admitting: Family Medicine

## 2019-11-30 ENCOUNTER — Ambulatory Visit: Payer: PPO | Admitting: Registered Nurse

## 2019-12-07 ENCOUNTER — Ambulatory Visit: Payer: PPO | Admitting: Registered Nurse

## 2019-12-08 ENCOUNTER — Encounter: Payer: Self-pay | Admitting: Registered Nurse

## 2019-12-09 ENCOUNTER — Ambulatory Visit: Payer: PPO | Admitting: Endocrinology

## 2019-12-15 ENCOUNTER — Other Ambulatory Visit: Payer: Self-pay

## 2019-12-15 ENCOUNTER — Ambulatory Visit (INDEPENDENT_AMBULATORY_CARE_PROVIDER_SITE_OTHER): Payer: PPO | Admitting: Orthopaedic Surgery

## 2019-12-15 ENCOUNTER — Ambulatory Visit (INDEPENDENT_AMBULATORY_CARE_PROVIDER_SITE_OTHER): Payer: PPO

## 2019-12-15 ENCOUNTER — Encounter: Payer: Self-pay | Admitting: Orthopaedic Surgery

## 2019-12-15 VITALS — Ht 71.0 in | Wt 248.0 lb

## 2019-12-15 DIAGNOSIS — G8929 Other chronic pain: Secondary | ICD-10-CM | POA: Diagnosis not present

## 2019-12-15 DIAGNOSIS — M25561 Pain in right knee: Secondary | ICD-10-CM

## 2019-12-15 DIAGNOSIS — M1711 Unilateral primary osteoarthritis, right knee: Secondary | ICD-10-CM | POA: Insufficient documentation

## 2019-12-15 NOTE — Progress Notes (Signed)
Office Visit Note   Patient: Daniel [name]           Date of Birth: 1955-03-14           MRN: [id number] Visit Date: 12/15/2019              Requested by: [name], MD [address] [employer] [place],  [place] [address] PCP: [name], MD   Assessment & Plan: Visit Diagnoses:  1. Chronic pain of right knee   2. Unilateral primary osteoarthritis, right knee     Plan: Patient has been through therapy also had intra-articular injections both right and left knee with persistent symptoms.  He has bone-on-bone changes and primarily right leg is been giving him problems with falling.  He has been treated with topical creams intra-articular injections.  Patient is on Xarelto has not been able to take anti-inflammatories.  We discussed the next step would be total knee arthroplasty.  He did need cardiac clearance as well as medical clearance with his diabetes.  He has lost weight from BMI greater than 40 now and a BMI of 34.  We discussed the need clearance for knee arthroplasty could be scheduled for the right knee.  We discussed operative technique usual recommendations of spinal anesthesia postoperative home therapy followed by outpatient therapy.  Has family at home available.  He can call and let us know what he like to do this for scheduling.  Follow-Up Instructions: No follow-ups on file.   Orders:  Orders Placed This Encounter  Procedures  . XR Knee 1-2 Views Right   No orders of the defined types were placed in this encounter.     Procedures: No procedures performed   Clinical Data: No additional findings.   Subjective: Chief Complaint  Patient presents with  . Lower Back - Pain  . Left Knee - Pain  . Right Knee - Pain    HPI 64 year old male returns for follow-up states his back is doing better he went to physical therapy and states they were doing the same things he been doing at home.  He states his back is eased off but now is having significant increased  problems with his knees.  His left knee is much more painful than his right.  He seen [name] but states he would rather have me take care of his needs.  Patient has some problems with Parkinson's in his left hand was shaking.  His PCP had recommended nothing really can be done about it at this point.  He has more pain in his left knee but states the right knee is the one that gives him most trouble with catching locking and buckling and points to the medial joint line where he feels the grabbing or catching.  Review of Systems new system positive for diabetes depression Parkinson's stage III kidney disease GERD history of DVT on anticoagulation.  Negative for heart attack.positive HIV on medication.    Objective: Vital Signs: Ht 5' 11"  (1.803 m)   Wt 248 lb (112.5 kg)   BMI 34.59 kg/m   Physical Exam Constitutional:      Appearance: He is well-developed.  HENT:     Head: Normocephalic and atraumatic.  Eyes:     Pupils: Pupils are equal, round, and reactive to light.  Neck:     Thyroid: No thyromegaly.     Trachea: No tracheal deviation.  Cardiovascular:     Rate and Rhythm: Normal rate.  Pulmonary:  Effort: Pulmonary effort is normal.     Breath sounds: No wheezing.  Abdominal:     General: Bowel sounds are normal.     Palpations: Abdomen is soft.  Skin:    General: Skin is warm and dry.     Capillary Refill: Capillary refill takes less than 2 seconds.  Neurological:     Mental Status: He is alert and oriented to person, place, and time.  Psychiatric:        Behavior: Behavior normal.        Thought Content: Thought content normal.        Judgment: Judgment normal.     Ortho Exam left hand tremor more the right hand with pill-rolling uncontrolled mild shaking.  He is ambulatory.  Specialty Comments:  No specialty comments available.  Imaging: No results found.   PMFS History: Patient Active Problem List   Diagnosis Date Noted  . Unilateral primary  osteoarthritis, right knee 12/15/2019  . Acute encephalopathy   . Generalized weakness 09/14/2019  . Severe major depression ([place]) 11/17/2018  . Class 2 obesity due to excess calories without serious comorbidity with body mass index (BMI) of 36.0 to 36.9 in adult 11/17/2018  . Diabetes ([place]) 11/09/2018  . Anticoagulated 08/24/2018  . Dyslipidemia 07/27/2018  . Essential hypertension 06/25/2018  . Hypogonadism in male 05/10/2018  . Numbness 03/31/2018  . Diabetic peripheral neuropathy ([place]) 01/25/2018  . History of syncope 01/25/2018  . S/P transmetatarsal amputation of foot, right ([place]) 10/09/2017  . Osteomyelitis ([place])   . Hypothyroidism   . Constipation   . History of DVT (deep vein thrombosis) 09/30/2017  . Obesity, Class III, BMI 40-49.9 (morbid obesity) ([place]) 01/15/2017  . DOE (dyspnea on exertion) 01/13/2017  . Chronic migraine 09/17/2016  . Gait abnormality 09/17/2016  . Diabetic foot infection ([place]) 06/28/2016  . Fall 12/05/2015  . Toe ulcer, right ([place]) 09/19/2015  . Decreased pedal pulses 09/19/2015  . OSA on CPAP 09/05/2015  . Major depressive disorder, recurrent episode, moderate ([place]) 09/05/2015  . Low back pain 06/11/2015  . Abnormality of gait 06/11/2015  . Dizziness 03/17/2015  . Weakness 02/21/2015  . Chronic renal insufficiency, stage III (moderate) ([place]) 08/09/2014  . Insulin-requiring or dependent type II diabetes mellitus ([place]) 11/07/2013  . Hematuria 06/21/2013  . Hepatic steatosis 09/09/2010  . Urinary tract infection without hematuria 09/15/2006  . Human immunodeficiency virus (HIV) disease ([place]) 06/04/2006  . HERPES ZOSTER, UNCOMPLICATED 26/20/3559  . Depression 06/04/2006  . THROMBOPHLEBITIS NOS 06/04/2006  . GERD 06/04/2006  . ARTHRITIS, HAND 06/04/2006   Past Medical History:  Diagnosis Date  . ADHD (attention deficit hyperactivity disorder)   . Anxiety   . Chronic kidney disease   . Clotting disorder ([place])   . Depression   . Diabetes  mellitus without complication ([place])   . Diabetes mellitus, type II ([place])   . Dizziness 03/17/2015  . Essential hypertension 06/25/2018  . GERD (gastroesophageal reflux disease)   . HIV disease ([place])   . HIV infection ([place])   . Liver disease   . OSA (obstructive sleep apnea) 07/25/2015   Uses CPAP regularly  . Peripheral vascular disease ([place])   . Ulcer     Family History  Problem Relation Age of Onset  . Depression Brother   . Throat cancer Brother        half brother, never smoker  . COPD Mother   . Diabetes Neg Hx     Past Surgical History:  Procedure Laterality Date  .  AMPUTATION Right 10/02/2017   Procedure: RIGHT TRANSMETATARSAL AMPUTATION;  Surgeon: Leandrew Koyanagi, MD;  Location: [facility];  Service: Orthopedics;  Laterality: Right;  . SMALL INTESTINE SURGERY    . STOMACH SURGERY    . TOE AMPUTATION Right 08/2016   right great toe   Social History   Occupational History    Comment: DISABILITY  Tobacco Use  . Smoking status: Former Smoker    Packs/day: 0.10    Years: 10.00    Pack years: 1.00    Types: Cigars    Quit date: 08/09/2014    Years since quitting: 5.3  . Smokeless tobacco: Never Used  Vaping Use  . Vaping Use: Never used  Substance and Sexual Activity  . Alcohol use: No    Alcohol/week: 0.0 standard drinks  . Drug use: No  . Sexual activity: Not Currently    Partners: Male    Comment: pt. declined condoms

## 2019-12-19 ENCOUNTER — Telehealth: Payer: Self-pay | Admitting: Emergency Medicine

## 2019-12-19 ENCOUNTER — Other Ambulatory Visit: Payer: Self-pay | Admitting: Endocrinology

## 2019-12-19 ENCOUNTER — Other Ambulatory Visit: Payer: Self-pay | Admitting: Family Medicine

## 2019-12-19 DIAGNOSIS — I824Y9 Acute embolism and thrombosis of unspecified deep veins of unspecified proximal lower extremity: Secondary | ICD-10-CM

## 2019-12-19 DIAGNOSIS — E1142 Type 2 diabetes mellitus with diabetic polyneuropathy: Secondary | ICD-10-CM

## 2019-12-19 DIAGNOSIS — M25562 Pain in left knee: Secondary | ICD-10-CM

## 2019-12-19 DIAGNOSIS — G8929 Other chronic pain: Secondary | ICD-10-CM

## 2019-12-19 NOTE — Telephone Encounter (Signed)
Requested medication (s) are due for refill today:  Yes  Requested medication (s) are on the active medication list:  Yes  Future visit scheduled:  No  Last Refill: Pregabalin 05/19/19                   Xarelto    05/19/19                   Diclofenac 11/17/19  Notes to clinic: Pregabalin is not delegated; Xarelto failed protocol due to low platelets; Diclofenac was not refilled due to pt. Recently being evaluated by Ortho.  Please advise.  Requested Prescriptions  Pending Prescriptions Disp Refills   pregabalin (LYRICA) 150 MG capsule [Pharmacy Med Name: PREGABALIN 150MG CAPSULES] 360 capsule     Sig: TAKE 2 CAPSULES BY MOUTH TWICE DAILY      Not Delegated - Neurology:  Anticonvulsants - Controlled Failed - 12/19/2019  2:55 PM      Failed - This refill cannot be delegated      Passed - Valid encounter within last 12 months    Recent Outpatient Visits           1 month ago Benign paroxysmal positional vertigo due to bilateral vestibular disorder   [facility], NP   1 month ago Right-sided low back pain with right-sided sciatica, unspecified chronicity   [facility], Ranell Patrick, MD   2 months ago Personal history of fall   [facility], Ranell Patrick, MD   3 months ago Right-sided low back pain with right-sided sciatica, unspecified chronicity   [facility], Ranell Patrick, MD   7 months ago Right-sided low back pain with right-sided sciatica, unspecified chronicity   [facility], Ranell Patrick, MD       Future Appointments             In 2 weeks Skeet Latch, MD [facility] [place], [facility]   In 4 months Erlinda Hong, Marylynn Pearson, MD [place] 20 MG TABS tablet [Pharmacy Med Name: XARELTO 20MG TABLETS] 90 tablet 1    Sig: TAKE 1 TABLET(20 MG) BY MOUTH DAILY      Hematology: Anticoagulants - rivaroxaban Failed - 12/19/2019  2:55 PM      Failed - Cr  in normal range and within 360 days    Creat  Date Value Ref Range Status  09/13/2019 1.69 (H) 0.70 - 1.25 mg/dL Final    Comment:    For patients >53 years of age, the reference limit for Creatinine is approximately 13% higher for people identified as African-American. .    Creatinine, Ser  Date Value Ref Range Status  09/28/2019 1.28 (H) 0.76 - 1.27 mg/dL Final          Failed - PLT in normal range and within 360 days    Platelets  Date Value Ref Range Status  09/15/2019 125 (L) 150 - 400 K/uL Final  11/17/2018 149 (L) 150 - 450 x10E3/uL Final   Platelet Count, POC  Date Value Ref Range Status  11/26/2015 161 142 - 424 K/uL Final          Passed - ALT in normal range and within 180 days    ALT  Date Value Ref Range Status  09/15/2019 24 0 - 44 U/L Final  Passed - AST in normal range and within 180 days    AST  Date Value Ref Range Status  09/15/2019 21 15 - 41 U/L Final          Passed - HCT in normal range and within 360 days    HCT  Date Value Ref Range Status  09/15/2019 46.8 39 - 52 % Final  05/19/2011 49.1 38 - 49 % Final   Hematocrit  Date Value Ref Range Status  11/17/2018 50.0 37.5 - 51.0 % Final          Passed - HGB in normal range and within 360 days    Hemoglobin  Date Value Ref Range Status  09/15/2019 15.9 13.0 - 17.0 g/dL Final  11/17/2018 17.3 13.0 - 17.7 g/dL Final   HGB  Date Value Ref Range Status  05/19/2011 17.1 13.0 - 17.1 g/dL Final          Passed - Valid encounter within last 12 months    Recent Outpatient Visits           1 month ago Benign paroxysmal positional vertigo due to bilateral vestibular disorder   [facility], NP   1 month ago Right-sided low back pain with right-sided sciatica, unspecified chronicity   [facility], Ranell Patrick, MD   2 months ago Personal history of fall   [facility], Ranell Patrick, MD   3 months ago Right-sided low back  pain with right-sided sciatica, unspecified chronicity   [facility], Ranell Patrick, MD   7 months ago Right-sided low back pain with right-sided sciatica, unspecified chronicity   [facility], Ranell Patrick, MD       Future Appointments             In 2 weeks Skeet Latch, MD [facility], [facility]   In 4 months Erlinda Hong, Marylynn Pearson, MD [place]              diclofenac Sodium (VOLTAREN) 1 % GEL [Pharmacy Med Name: DICLOFENAC 1% GEL 100GM] 300 g 0    Sig: APPLY 2 GRAMS TOPICALLY TO EACH KNEE IN THE MORNING AND AT BEDTIME- APPLY 1 GRAM TO EACH KNEE IN THE AFTERNOON      Analgesics:  Topicals Passed - 12/19/2019  2:55 PM      Passed - Valid encounter within last 12 months    Recent Outpatient Visits           1 month ago Benign paroxysmal positional vertigo due to bilateral vestibular disorder   [facility] [place], NP   1 month ago Right-sided low back pain with right-sided sciatica, unspecified chronicity   [facility], Ranell Patrick, MD   2 months ago Personal history of fall   [facility], Ranell Patrick, MD   3 months ago Right-sided low back pain with right-sided sciatica, unspecified chronicity   [facility], Ranell Patrick, MD   7 months ago Right-sided low back pain with right-sided sciatica, unspecified chronicity   [facility], Ranell Patrick, MD       Future Appointments             In 2 weeks Skeet Latch, MD [facility]   In 4 months Leandrew Koyanagi, MD [facility]

## 2019-12-19 NOTE — Telephone Encounter (Signed)
Dr.Greene thi patient was  Last seen by you on 10/26/19. I approved patient other medication but I forgot that Lyrica is a control med and I accidentally refill but it printer. If you approve this medication can you resend this med to the pharmacy. If you do not approve let me know And I can discard in patient chart.

## 2019-12-20 ENCOUNTER — Encounter: Payer: Self-pay | Admitting: Family Medicine

## 2019-12-26 ENCOUNTER — Telehealth: Payer: Self-pay

## 2019-12-26 NOTE — Telephone Encounter (Signed)
Patient with diagnosis of recurrent DVT/PE on lifelong Xarelto for anticoagulation.    Procedure: right TKA Date of procedure: TBD  Typically hold Xarelto for 3 days prior to TKA. Xarelto is being prescribed and managed by PCP. Recommend deferring to PCP for final clearance recommendation in setting of recurrent VTE.

## 2019-12-26 NOTE — Telephone Encounter (Signed)
° °  [facility] Pre-operative Risk Assessment    HEARTCARE STAFF: - Please ensure there is not already an duplicate clearance open for this procedure. - Under Visit Info/Reason for Call, type in Other and utilize the format Clearance MM/DD/YY or Clearance TBD. Do not use dashes or single digits. - If request is for dental extraction, please clarify the # of teeth to be extracted.  Request for surgical clearance:  1. What type of surgery is being performed? Right Total Knee Arthoplasty   2. When is this surgery scheduled? TBD   3. What type of clearance is required (medical clearance vs. Pharmacy clearance to hold med vs. Both)? BOTH   4. Are there any medications that need to be held prior to surgery and how long? Xarelto   5. Practice name and name of physician performing surgery? [facility], Dr. Rodell Perna  6. What is the office phone number?  [phone]    7.   What is the office fax number?  [place].   Anesthesia type (None, local, MAC, general) ? Spinal    Daniel [name] 12/26/2019, 1:05 PM  _________________________________________________________________   (provider comments below)

## 2019-12-26 NOTE — Telephone Encounter (Signed)
Clinical pharmacist to review, patient is on Xarelto for recurrent unprovoked DVT

## 2019-12-26 NOTE — Telephone Encounter (Signed)
Patient is overdue for follow up and will need to be seen before clearance. He is scheduled to see Dr. [name] on 11/10, callback pool to update the reason behind upcoming visit to include cardiac clearance. Will defer to MD and will remove this from the pool.

## 2019-12-26 NOTE — Telephone Encounter (Signed)
Updated upcoming appointment note to include "Pre-Op clearance"

## 2019-12-28 NOTE — Telephone Encounter (Signed)
Pt has appt 01/04/20 with Dr. [name]. Will forward clearance notes to MD for upcoming appt.

## 2020-01-02 ENCOUNTER — Ambulatory Visit (INDEPENDENT_AMBULATORY_CARE_PROVIDER_SITE_OTHER): Payer: PPO | Admitting: Endocrinology

## 2020-01-02 ENCOUNTER — Other Ambulatory Visit: Payer: Self-pay

## 2020-01-02 ENCOUNTER — Encounter: Payer: Self-pay | Admitting: Endocrinology

## 2020-01-02 VITALS — BP 164/92 | HR 103 | Ht 71.0 in | Wt 259.0 lb

## 2020-01-02 DIAGNOSIS — E11649 Type 2 diabetes mellitus with hypoglycemia without coma: Secondary | ICD-10-CM

## 2020-01-02 DIAGNOSIS — E1142 Type 2 diabetes mellitus with diabetic polyneuropathy: Secondary | ICD-10-CM

## 2020-01-02 DIAGNOSIS — Z6836 Body mass index (BMI) 36.0-36.9, adult: Secondary | ICD-10-CM

## 2020-01-02 DIAGNOSIS — E6609 Other obesity due to excess calories: Secondary | ICD-10-CM | POA: Diagnosis not present

## 2020-01-02 MED ORDER — LANTUS SOLOSTAR 100 UNIT/ML ~~LOC~~ SOPN: 120.0000 [IU] | PEN_INJECTOR | SUBCUTANEOUS | 3 refills | Status: DC

## 2020-01-02 NOTE — Progress Notes (Signed)
Subjective:    Patient ID: Daniel [name], male    DOB: [birth year], 64 y.o.   MRN: [id number]  HPI Pt returns for f/u of diabetes mellitus: DM type: Insulin-requiring type 2.  Dx'ed: 9628 Complications: PN, PAD, CRI, and foot ulcers.   Therapy: insulin since 3662, Trulicity, and Invokana.   DKA: never Severe hypoglycemia: never.  Pancreatitis: never.   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   Other: he takes multiple daily injections, but basal insulin is emphasized, with improved results; he is retired.   Interval history: I reviewed continuous glucose monitor data.  Glucose varies from 47-250.  It is in general higher as the day goes on.  pt states he feels well in general.  He takes Lantus 150/d.   Past Medical History:  Diagnosis Date  . ADHD (attention deficit hyperactivity disorder)   . Anxiety   . Chronic kidney disease   . Clotting disorder ([place])   . Depression   . Diabetes mellitus without complication ([place])   . Diabetes mellitus, type II ([place])   . Dizziness 03/17/2015  . Essential hypertension 06/25/2018  . GERD (gastroesophageal reflux disease)   . HIV disease ([place])   . HIV infection ([place])   . Liver disease   . OSA (obstructive sleep apnea) 07/25/2015   Uses CPAP regularly  . Peripheral vascular disease ([place])   . Ulcer     Past Surgical History:  Procedure Laterality Date  . AMPUTATION Right 10/02/2017   Procedure: RIGHT TRANSMETATARSAL AMPUTATION;  Surgeon: Leandrew Koyanagi, MD;  Location: [facility];  Service: Orthopedics;  Laterality: Right;  . SMALL INTESTINE SURGERY    . STOMACH SURGERY    . TOE AMPUTATION Right 08/2016   right great toe    Social History   Socioeconomic History  . Marital status: Single    Spouse name: Not on file  . Number of children: Not on file  . Years of education: Not on file  . Highest education level: Not on file  Occupational History    Comment: DISABILITY  Tobacco Use  . Smoking status:  Former Smoker    Packs/day: 0.10    Years: 10.00    Pack years: 1.00    Types: Cigars    Quit date: 08/09/2014    Years since quitting: 5.4  . Smokeless tobacco: Never Used  Vaping Use  . Vaping Use: Never used  Substance and Sexual Activity  . Alcohol use: No    Alcohol/week: 0.0 standard drinks  . Drug use: No  . Sexual activity: Not Currently    Partners: Male    Comment: pt. declined condoms  Other Topics Concern  . Not on file  Social History Narrative   Epworth Sleepiness Scale = 7 (as of 03/16/2015)   Social Determinants of Health   Financial Resource Strain:   . Difficulty of Paying Living Expenses: Not on file  Food Insecurity:   . Worried About Charity fundraiser in the Last Year: Not on file  . Ran Out of Food in the Last Year: Not on file  Transportation Needs:   . Lack of Transportation (Medical): Not on file  . Lack of Transportation (Non-Medical): Not on file  Physical Activity:   . Days of Exercise per Week: Not on file  . Minutes of Exercise per Session: Not on file  Stress:   . Feeling of Stress : Not on file  Social Connections:   .  Frequency of Communication with Friends and Family: Not on file  . Frequency of Social Gatherings with Friends and Family: Not on file  . Attends Religious Services: Not on file  . Active Member of Clubs or Organizations: Not on file  . Attends Archivist Meetings: Not on file  . Marital Status: Not on file  Intimate Partner Violence:   . Fear of Current or Ex-Partner: Not on file  . Emotionally Abused: Not on file  . Physically Abused: Not on file  . Sexually Abused: Not on file    Current Outpatient Medications on File Prior to Visit  Medication Sig Dispense Refill  . abacavir-dolutegravir-lamiVUDine (TRIUMEQ) 600-50-300 MG tablet Take 1 tablet by mouth daily. (Patient taking differently: Take 1 tablet by mouth at bedtime. ) 30 tablet 6  . acetaminophen (TYLENOL) 500 MG tablet Take 1,500 mg by mouth in  the morning and at bedtime.     . ALPRAZolam (XANAX) 1 MG tablet Take 1 mg by mouth at bedtime. *May take one tablet up to 4 times daily as needed for anxiety    . amphetamine-dextroamphetamine (ADDERALL) 30 MG tablet Take 30 mg by mouth 3 (three) times daily.    . Brexpiprazole (REXULTI) 1 MG TABS Take 1 mg by mouth at bedtime.    . canagliflozin (INVOKANA) 100 MG TABS tablet Take 1 tablet (100 mg total) by mouth daily before breakfast. 30 tablet 11  . Continuous Blood Gluc Sensor (FREESTYLE LIBRE 14 DAY SENSOR) MISC USE AS DIRECTED EVERY 14 DAYS 6 each 3  . diclofenac Sodium (VOLTAREN) 1 % GEL APPLY 2 GRAMS TOPICALLY TO EACH KNEE IN THE MORNING AND AT BEDTIME- APPLY 1 GRAM TO EACH KNEE IN THE AFTERNOON 300 g 0  . divalproex (DEPAKOTE ER) 500 MG 24 hr tablet TAKE 1 TABLET(500 MG) BY MOUTH AT BEDTIME 90 tablet 1  . ezetimibe (ZETIA) 10 MG tablet TAKE 1 TABLET(10 MG) BY MOUTH DAILY 90 tablet 1  . furosemide (LASIX) 40 MG tablet TAKE 1 TABLET(40 MG) BY MOUTH DAILY 90 tablet 1  . Insulin Aspart FlexPen 100 UNIT/ML SOPN 15 units with breakfast, and 25 unit with supper 36 mL 0  . Insulin Pen Needle (B-D ULTRAFINE III SHORT PEN) 31G X 8 MM MISC 1 each by Other route 3 (three) times daily. E11.9 100 each 2  . levothyroxine (SYNTHROID, LEVOTHROID) 50 MCG tablet Take 1 tablet (50 mcg total) by mouth at bedtime. 30 tablet 11  . meclizine (ANTIVERT) 12.5 MG tablet Take 1 tablet (12.5 mg total) by mouth 2 (two) times daily as needed for dizziness. 30 tablet 0  . ondansetron (ZOFRAN) 8 MG tablet TAKE 1 TABLET(8 MG) BY MOUTH EVERY 8 HOURS AS NEEDED FOR NAUSEA OR VOMITING 20 tablet 1  . pantoprazole (PROTONIX) 40 MG tablet TAKE 1 TABLET(40 MG) BY MOUTH DAILY AT 6 AM 90 tablet 0  . polyethylene glycol (MIRALAX / GLYCOLAX) packet Take 17 g by mouth 2 (two) times daily. (Patient taking differently: Take 17 g by mouth 2 (two) times daily as needed for mild constipation or moderate constipation. ) 30 each 0  .  pregabalin (LYRICA) 150 MG capsule TAKE 2 CAPSULES BY MOUTH TWICE DAILY 360 capsule 0  . protriptyline (VIVACTIL) 10 MG tablet Take 10 mg by mouth 3 (three) times daily.   11  . rivaroxaban (XARELTO) 20 MG TABS tablet Take 1 tablet (20 mg total) by mouth daily with supper. 90 tablet 0  . traMADol (ULTRAM) 50 MG  tablet Take 1 tablet (50 mg total) by mouth 2 (two) times daily as needed. 30 tablet 1  . TRINTELLIX 20 MG TABS tablet Take 20 mg by mouth at bedtime.     . TRULICITY 3 ZL/9.3TT SOPN INJECT 0.5ML(3MG TOTAL) INTO THE SKIN ONCE A WEEK 1.5 mL 3  . zolpidem (AMBIEN) 10 MG tablet Take 10 mg by mouth at bedtime.      No current facility-administered medications on file prior to visit.    Allergies  Allergen Reactions  . Aspirin Swelling  . Ibuprofen Swelling  . Sustiva [Efavirenz] Swelling and Rash  . Nsaids Other (See Comments)    unknwn  . Lipitor [Atorvastatin Calcium] Other (See Comments)    Leg pain    Family History  Problem Relation Age of Onset  . Depression Brother   . Throat cancer Brother        half brother, never smoker  . COPD Mother   . Diabetes Neg Hx     BP (!) 164/92   Pulse (!) 103   Ht 5' 11"  (1.803 m)   Wt 259 lb (117.5 kg)   SpO2 98%   BMI 36.12 kg/m    Review of Systems Denies LOC and nausea.      Objective:   Physical Exam VITAL SIGNS:  See vs page.   GENERAL: no distress.  Pulses: left foot pulses are intact.    MSK: no deformity of the feet or ankles, except for right TMA.     CV: 1+ bilat edema of the legs.   Skin:  normal color and temp on the feet and ankles.  No erythema.  No ulcer, but there are bilat heavy calluses.  Neuro: sensation is intact to touch on the feet and ankles, but severely decreased from normal.   There is onychomycosis of the left foot toenails.   A1c=5.6%     Assessment & Plan:  Insulin-requiring type 2 DM, with PAD Hypoglycemia, due to insulin.  Patient Instructions  Please reduce the Lantus to 120 units  each morning, and:    continue the same other diabetes medications.  check your blood sugar twice a day.  vary the time of day when you check, between before the 3 meals, and at bedtime.  also check if you have symptoms of your blood sugar being too high or too low.  please keep a record of the readings and bring it to your next appointment here (or you can bring the meter itself).  You can write it on any piece of paper.  please call us sooner if your blood sugar goes below 70, or if you have a lot of readings over 200.   Please come back for a follow-up appointment in 3 months.

## 2020-01-02 NOTE — Patient Instructions (Addendum)
Please reduce the Lantus to 120 units each morning, and:    continue the same other diabetes medications.  check your blood sugar twice a day.  vary the time of day when you check, between before the 3 meals, and at bedtime.  also check if you have symptoms of your blood sugar being too high or too low.  please keep a record of the readings and bring it to your next appointment here (or you can bring the meter itself).  You can write it on any piece of paper.  please call us sooner if your blood sugar goes below 70, or if you have a lot of readings over 200.   Please come back for a follow-up appointment in 3 months.

## 2020-01-04 ENCOUNTER — Ambulatory Visit: Payer: PPO | Admitting: Cardiovascular Disease

## 2020-01-04 ENCOUNTER — Other Ambulatory Visit: Payer: Self-pay

## 2020-01-04 ENCOUNTER — Encounter: Payer: Self-pay | Admitting: Cardiovascular Disease

## 2020-01-04 VITALS — BP 122/68 | HR 85 | Temp 97.7°F | Ht 71.0 in | Wt 252.4 lb

## 2020-01-04 DIAGNOSIS — I10 Essential (primary) hypertension: Secondary | ICD-10-CM | POA: Diagnosis not present

## 2020-01-04 DIAGNOSIS — E785 Hyperlipidemia, unspecified: Secondary | ICD-10-CM

## 2020-01-04 DIAGNOSIS — R300 Dysuria: Secondary | ICD-10-CM | POA: Diagnosis not present

## 2020-01-04 DIAGNOSIS — R609 Edema, unspecified: Secondary | ICD-10-CM

## 2020-01-04 DIAGNOSIS — R0602 Shortness of breath: Secondary | ICD-10-CM | POA: Diagnosis not present

## 2020-01-04 NOTE — Progress Notes (Signed)
Cardiology Office Note   Date:  01/04/2020   ID:  [name], [name] 05/26/1955, MRN [id number]  PCP:  [name], MD  Cardiologist:   Skeet Latch, MD   No chief complaint on file.   Daniel [name] is a 64 y.o. male with HIV, recurrent DV/PE on lifelong anticoagulation, OSA on CPAP, and diabetes who presents for follow up.   History of Present Illness:   Daniel [name] was fist evaluated 02/2015 for dizziness and falls. The symptoms had been ongoing for several months. He felt as though the room was spinning when changing position, leaning his head back or laying down. He was referred for an echo 03/2015 that revealed LVEF 60-65% with mild LVH. He also had carotid Dopplers 02/2015 that revealed minimal plaque bilaterally. Additionally, he reported exertional dyspnea so he was referred for [facility] which was negative for ischemia. In the interim Daniel [name] also had nerve conduction studies that revealed peripheral neuropathy consistent with diabetic neuropathy. He was referred for a 30 day event monitor 07/30/2015 that was unremarkable.  Daniel [name] had toes amputated on his R leg. ABIs were normal 09/2015 prior to his great toe amputation.   Atypical chest pain and had a Lexiscan Myoview 03/2018 that revealed LVEF 59% and no ischemia.  Daniel [name] had lipids checked which revealed an LDL of 113.  This was down from 154.  He had a persistent transaminitis with AST 52 and ALT 64.  This persisted after stopping statins.   His blood pressure was poorly controlled so amlodipine was added.  He followed up with [name], PA-C on 07/2018, at which time his blood pressure remained poorly controlled. He complains of pain in his knees that make it difficult for him to walk.  He also is unsteady on his feet since his amputations.  This is compounded by the neuropathy in his feet.  He does not get much exercise.  He is also had several falls.  His knees give out and he is just unsteady and  trips easily.    Daniel [name] has been struggling more with pain in his knees.  He is contemplating knee replacement.  He uses voltaren gel which helps.  He has no chest pain with exertion.  He is limited from exercise by his knees.  He can go up one flight of stairs without shortness of breath.  He gets short of breaht if he needs to go up more than one flight.  He needs have his knees replaced and has struggled with falls.  His knees give out underneath him.  He notes a sock imprint at the end of the day but no overt edema.  He denies orthopnea or PND.  He doesn't check his BP at home.  He doesn't eat much but thinks that he is eating the wrong things. He struggles with eating late.  He mostly eats frozen dinner and likes Asian food.  He gets short of breath with walking short distances.  He also notes some pain in his right flank that he thinks is coming from his kidneys.  He denies dysuria or hematuria but his urine has been dark lately.  Past Medical History:  Diagnosis Date   ADHD (attention deficit hyperactivity disorder)    Anxiety    Chronic kidney disease    Clotting disorder ([place])    Depression    Diabetes mellitus without complication ([place])    Diabetes mellitus, type II ([place])  Dizziness 03/17/2015   Essential hypertension 06/25/2018   GERD (gastroesophageal reflux disease)    HIV disease ([name])    HIV infection ([name])    Liver disease    OSA (obstructive sleep apnea) 07/25/2015   Uses CPAP regularly   Peripheral vascular disease ([place])    Ulcer     Past Surgical History:  Procedure Laterality Date   AMPUTATION Right 10/02/2017   Procedure: RIGHT TRANSMETATARSAL AMPUTATION;  Surgeon: Leandrew Koyanagi, MD;  Location: [facility];  Service: Orthopedics;  Laterality: Right;   SMALL INTESTINE SURGERY     STOMACH SURGERY     TOE AMPUTATION Right 08/2016   right great toe    Current Outpatient Medications  Medication Sig Dispense Refill   abacavir-dolutegravir-lamiVUDine  (TRIUMEQ) 600-50-300 MG tablet Take 1 tablet by mouth daily. (Patient taking differently: Take 1 tablet by mouth at bedtime. ) 30 tablet 6   acetaminophen (TYLENOL) 500 MG tablet Take 1,500 mg by mouth in the morning and at bedtime.      ALPRAZolam (XANAX) 1 MG tablet Take 1 mg by mouth at bedtime. *May take one tablet up to 4 times daily as needed for anxiety     amphetamine-dextroamphetamine (ADDERALL) 30 MG tablet Take 30 mg by mouth 3 (three) times daily.     Brexpiprazole (REXULTI) 1 MG TABS Take 1 mg by mouth at bedtime.     canagliflozin (INVOKANA) 100 MG TABS tablet Take 1 tablet (100 mg total) by mouth daily before breakfast. 30 tablet 11   Continuous Blood Gluc Sensor (FREESTYLE LIBRE 14 DAY SENSOR) MISC USE AS DIRECTED EVERY 14 DAYS 6 each 3   diclofenac Sodium (VOLTAREN) 1 % GEL APPLY 2 GRAMS TOPICALLY TO EACH KNEE IN THE MORNING AND AT BEDTIME- APPLY 1 GRAM TO EACH KNEE IN THE AFTERNOON 300 g 0   divalproex (DEPAKOTE ER) 500 MG 24 hr tablet TAKE 1 TABLET(500 MG) BY MOUTH AT BEDTIME 90 tablet 1   ezetimibe (ZETIA) 10 MG tablet TAKE 1 TABLET(10 MG) BY MOUTH DAILY 90 tablet 1   furosemide (LASIX) 40 MG tablet TAKE 1 TABLET(40 MG) BY MOUTH DAILY 90 tablet 1   Insulin Aspart FlexPen 100 UNIT/ML SOPN 15 units with breakfast, and 25 unit with supper 36 mL 0   insulin glargine (LANTUS SOLOSTAR) 100 UNIT/ML Solostar Pen Inject 120 Units into the skin every morning. 120 mL 3   Insulin Pen Needle (B-D ULTRAFINE III SHORT PEN) 31G X 8 MM MISC 1 each by Other route 3 (three) times daily. E11.9 100 each 2   levothyroxine (SYNTHROID, LEVOTHROID) 50 MCG tablet Take 1 tablet (50 mcg total) by mouth at bedtime. 30 tablet 11   meclizine (ANTIVERT) 12.5 MG tablet Take 1 tablet (12.5 mg total) by mouth 2 (two) times daily as needed for dizziness. 30 tablet 0   ondansetron (ZOFRAN) 8 MG tablet TAKE 1 TABLET(8 MG) BY MOUTH EVERY 8 HOURS AS NEEDED FOR NAUSEA OR VOMITING 20 tablet 1    pantoprazole (PROTONIX) 40 MG tablet TAKE 1 TABLET(40 MG) BY MOUTH DAILY AT 6 AM 90 tablet 0   polyethylene glycol (MIRALAX / GLYCOLAX) packet Take 17 g by mouth 2 (two) times daily. (Patient taking differently: Take 17 g by mouth 2 (two) times daily as needed for mild constipation or moderate constipation. ) 30 each 0   pregabalin (LYRICA) 150 MG capsule TAKE 2 CAPSULES BY MOUTH TWICE DAILY 360 capsule 0   protriptyline (VIVACTIL) 10 MG tablet Take 10 mg by  mouth 3 (three) times daily.   11   rivaroxaban (XARELTO) 20 MG TABS tablet Take 1 tablet (20 mg total) by mouth daily with supper. 90 tablet 0   traMADol (ULTRAM) 50 MG tablet Take 1 tablet (50 mg total) by mouth 2 (two) times daily as needed. 30 tablet 1   TRINTELLIX 20 MG TABS tablet Take 20 mg by mouth at bedtime.      TRULICITY 3 EP/3.2RJ SOPN INJECT 0.5ML(3MG TOTAL) INTO THE SKIN ONCE A WEEK 1.5 mL 3   zolpidem (AMBIEN) 10 MG tablet Take 10 mg by mouth at bedtime.      No current facility-administered medications for this visit.    Allergies:   Aspirin, Ibuprofen, Sustiva [efavirenz], Nsaids, and Lipitor [atorvastatin calcium]    Social History:  The patient  reports that he quit smoking about 5 years ago. His smoking use included cigars. He has a 1.00 pack-year smoking history. He has never used smokeless tobacco. He reports that he does not drink alcohol and does not use drugs.   Family History:  The patient's family history includes COPD in his mother; Depression in his brother; Throat cancer in his brother.    ROS:  Please see the history of present illness.  Otherwise, review of systems are positive for chronic R LE edema.   All other systems are reviewed and negative.    PHYSICAL EXAM: VS:  BP 140/74    Pulse 85    Temp 97.7 F (36.5 C)    Ht 5' 11"  (1.803 m)    Wt 252 lb 6.4 oz (114.5 kg)    SpO2 95%    BMI 35.20 kg/m  , BMI Body mass index is 35.2 kg/m. GENERAL:  Well appearing.  Dyspneic moving from the chair  to the exam table. HEENT: Pupils equal round and reactive, fundi not visualized, oral mucosa unremarkable NECK: Unable to assess JVD.  Waveform within normal limits, carotid upstroke brisk and symmetric, no bruits LUNGS:  Clear to auscultation bilaterally HEART:  RRR.  PMI not displaced or sustained,S1 and S2 within normal limits, no S3, no S4, no clicks, no rubs, no murmurs ABD:  Flat, positive bowel sounds normal in frequency in pitch, no bruits, no rebound, no guarding, no midline pulsatile mass, no hepatomegaly, no splenomegaly EXT:  2 plus pulses throughout, 2+ LE edema to upper tibia bilaterally, no cyanosis no clubbing SKIN:  No rashes no nodules NEURO:  Cranial nerves II through XII grossly intact, motor grossly intact throughout PSYCH:  Cognitively intact, oriented to person place and time   EKG:  EKG is ordered today. The ekg ordered 03/16/15 demonstrates sinus rhythm.  Rate 90 bpm.  03/24/18: Sinus rhythm.  Rate 97 bpm.  Occasional PVCs.   11/22/2018: Sinus rhythm.  Rate 82 bpm.  Incomplete right bundle branch block. 01/04/2020: Sinus rhythm.  Rate 85 bpm.  Incomplete right bundle branch block.  Carotid Doppler 03/23/15: IMPRESSION: 1. Trace smooth heterogeneous plaque in the distal left common carotid artery. 2. No evidence of internal carotid plaque or stenosis bilaterally. 3. Vertebral arteries remain patent with normal antegrade flow.  Echo 04/05/15: Study Conclusions  - Left ventricle: The cavity size was normal. Wall thickness was  increased in a pattern of mild LVH. Systolic function was normal.  The estimated ejection fraction was in the range of 60% to 65%.  Biplane speckle tracking LVEF was not accurately measured and  therefore not reported. Wall motion was normal; there were no  regional wall  motion abnormalities. Left ventricular diastolic  function parameters were normal for the patient&'s age. - Aortic valve: Mildly calcified annulus. Trileaflet. -  Mitral valve: Calcified annulus. There was trivial regurgitation. - Right atrium: Central venous pressure (est): 3 mm Hg. - Tricuspid valve: There was physiologic regurgitation. - Pulmonary arteries: Systolic pressure could not be accurately  estimated. - Pericardium, extracardiac: There was no pericardial effusion.  30 day Event Monitor 07/30/15:  Quality: Fair. Baseline artifact.  Sinus rhythm and sinus tachycardia noted during an episode of syncope.  Lexiscan Myoview 03/2018:  Normal perfusion NO ischemia or scar  Nuclear stress EF: 59%.  There was no ST segment deviation noted during stress.  The study is normal.  This is a low risk study.  Recent Labs: 09/14/2019: B Natriuretic Peptide 67.0; TSH 0.108 09/15/2019: ALT 24; Hemoglobin 15.9; Magnesium 2.3; Platelets 125 09/28/2019: BUN 22; Creatinine, Ser 1.28; Potassium 4.2; Sodium 142    Lipid Panel    Component Value Date/Time   CHOL 239 (H) 05/19/2019 1719   TRIG 241 (H) 05/19/2019 1719   HDL 40 05/19/2019 1719   CHOLHDL 6.0 (H) 05/19/2019 1719   CHOLHDL 3.3 08/09/2015 0956   VLDL 22 08/09/2015 0956   LDLCALC 155 (H) 05/19/2019 1719      Wt Readings from Last 3 Encounters:  01/04/20 252 lb 6.4 oz (114.5 kg)  01/02/20 259 lb (117.5 kg)  12/15/19 248 lb (112.5 kg)      ASSESSMENT AND PLAN:  # Atypical chest pain: Resolved.  Lexiscan Myoview was negative for ischemia 03/2018.  # Hyperlipidemia:  He stopped his atorvastatin 2/2 myalgias.  Statins stopped 2/2 transaminitis.  Continue Zetia.   # LE edema: # SOB: # Pre-operative risk assessment:   Mr. Hantz has quite a bit of swelling and is dyspneic with minimal exertion.  His symptoms do not seem to be ischemic and he had a normal stress test a year ago.  Therefore we will not repeat it.  However we will repeat his echocardiogram.  We will check a basic metabolic panel and a BNP today.  Increase Lasix to 40 mg twice daily for 3 days.  He needs to have this  assessed prior to clearing him for surgery.  # DVT: Recurrent, unprovoked DVT. Lifelong Xarelto.  Okay to hold 3 days preoperatively.  # Recurrent falls:  He never had PT after transmetatarsal amputation and is falling a lot.  He is planning to have knee replacement surgery.   Current medicines are reviewed at length with the patient today.  The patient does not have concerns regarding medicines.  The following changes have been made:  no change  Labs/ tests ordered today include:   No orders of the defined types were placed in this encounter.    Disposition:   FU with Dajuan Turnley C. [name], MD, [facility] in 6 months   .  Signed, Dujuan Stankowski C. [name], MD, [facility]  01/04/2020 2:51 PM    [facility]

## 2020-01-04 NOTE — Patient Instructions (Addendum)
Medication Instructions:  TAKE FUROSEMIDE TWICE A DAY THROUGH Friday   *If you need a refill on your cardiac medications before your next appointment, please call your pharmacy*  Lab Work: BMET/BNP URINALYSIS TODAY   If you have labs (blood work) drawn today and your tests are completely normal, you will receive your results only by:  [place] (if you have MyChart) OR  A paper copy in the mail If you have any lab test that is abnormal or we need to change your treatment, we will call you to review the results.   Testing/Procedures: Your physician has requested that you have an echocardiogram. Echocardiography is a painless test that uses sound waves to create images of your heart. It provides your doctor with information about the size and shape of your heart and how well your hearts chambers and valves are working. This procedure takes approximately one hour. There are no restrictions for this procedure. [place] [address]  Follow-Up: At [facility], you and your health needs are our priority.  As part of our continuing mission to provide you with exceptional heart care, we have created designated Provider Care Teams.  These Care Teams include your primary Cardiologist (physician) and Advanced Practice Providers (APPs -  Physician Assistants and Nurse Practitioners) who all work together to provide you with the care you need, when you need it.  We recommend signing up for the patient portal called "MyChart".  Sign up information is provided on this After Visit Summary.  MyChart is used to connect with patients for Virtual Visits (Telemedicine).  Patients are able to view lab/test results, encounter notes, upcoming appointments, etc.  Non-urgent messages can be sent to your provider as well.   To learn more about what you can do with MyChart, go to [email or web address].    Your next appointment:   6 month(s)  You will receive a reminder letter in the  mail two months in advance. If you don't receive a letter, please call our office to schedule the follow-up appointment.  The format for your next appointment:   In Person  Provider:   You may see Skeet Latch, MD  or one of the following Advanced Practice Providers on your designated Care Team:    [name], PA-C  [place], [place]  Coletta Memos, [place]

## 2020-01-05 ENCOUNTER — Telehealth: Payer: Self-pay | Admitting: Cardiovascular Disease

## 2020-01-05 LAB — BASIC METABOLIC PANEL
BUN/Creatinine Ratio: 15 (ref 10–24)
BUN: 23 mg/dL (ref 8–27)
CO2: 26 mmol/L (ref 20–29)
Calcium: 10 mg/dL (ref 8.6–10.2)
Chloride: 101 mmol/L (ref 96–106)
Creatinine, Ser: 1.56 mg/dL — ABNORMAL HIGH (ref 0.76–1.27)
GFR calc Af Amer: 53 mL/min/{1.73_m2} — ABNORMAL LOW (ref >59)
GFR calc non Af Amer: 46 mL/min/{1.73_m2} — ABNORMAL LOW (ref >59)
Glucose: 74 mg/dL (ref 65–99)
Potassium: 4.4 mmol/L (ref 3.5–5.2)
Sodium: 139 mmol/L (ref 134–144)

## 2020-01-05 LAB — BRAIN NATRIURETIC PEPTIDE: BNP: 8.7 pg/mL (ref 0.0–100.0)

## 2020-01-05 NOTE — Telephone Encounter (Signed)
Daniel [name] is calling stating he has not urinated today, so he will not be able to bring the urine sample in as originally planned & will bring it tomorrow.

## 2020-01-05 NOTE — Telephone Encounter (Signed)
Attempted to call patient, left message for patient to call back to office.   

## 2020-01-06 DIAGNOSIS — R300 Dysuria: Secondary | ICD-10-CM | POA: Diagnosis not present

## 2020-01-06 NOTE — Telephone Encounter (Signed)
Tried to call pt, Daniel [name]

## 2020-01-07 LAB — URINALYSIS
Bilirubin, UA: NEGATIVE
Ketones, UA: NEGATIVE
Leukocytes,UA: NEGATIVE
Nitrite, UA: NEGATIVE
Specific Gravity, UA: >=1.03 — AB (ref 1.005–1.030)
Urobilinogen, Ur: 1 mg/dL (ref 0.2–1.0)
pH, UA: 6 (ref 5.0–7.5)

## 2020-01-10 ENCOUNTER — Encounter: Payer: Self-pay | Admitting: Endocrinology

## 2020-01-10 ENCOUNTER — Encounter: Payer: Self-pay | Admitting: Family Medicine

## 2020-01-10 NOTE — Telephone Encounter (Signed)
UA has been completed. I will remove from triage pool.

## 2020-01-11 ENCOUNTER — Other Ambulatory Visit: Payer: Self-pay | Admitting: Endocrinology

## 2020-01-11 ENCOUNTER — Other Ambulatory Visit: Payer: Self-pay | Admitting: Family Medicine

## 2020-01-11 DIAGNOSIS — M25562 Pain in left knee: Secondary | ICD-10-CM

## 2020-01-11 DIAGNOSIS — G8929 Other chronic pain: Secondary | ICD-10-CM

## 2020-01-22 ENCOUNTER — Encounter: Payer: Self-pay | Admitting: Registered Nurse

## 2020-01-22 NOTE — Progress Notes (Signed)
Acute Office Visit  Subjective:    Patient ID: Daniel [name], male    DOB: 02/24/1956, 64 y.o.   MRN: [id number]  Chief Complaint  Patient presents with  . Dizziness    Pt stated that he has been experiencing some dizzness for the past 6-82mo. Pt stated that he has had episods 4x an hr.    HPI Patient is in today for dizziness  Has had hx of vertigo Has not been seen by vestibular rehab No CV symptoms Dizziness is positional Has had in office epley maneuver in the past - good effect - wants to know where he can get this done  No other concerns today.  Past Medical History:  Diagnosis Date  . ADHD (attention deficit hyperactivity disorder)   . Anxiety   . Chronic kidney disease   . Clotting disorder ([facility]   . Depression   . Diabetes mellitus without complication ([facility]   . Diabetes mellitus, type II ([facility]   . Dizziness 03/17/2015  . Essential hypertension 06/25/2018  . GERD (gastroesophageal reflux disease)   . HIV disease ([facility]   . HIV infection ([facility]   . Liver disease   . OSA (obstructive sleep apnea) 07/25/2015   Uses CPAP regularly  . Peripheral vascular disease ([facility]   . Ulcer     Past Surgical History:  Procedure Laterality Date  . AMPUTATION Right 10/02/2017   Procedure: RIGHT TRANSMETATARSAL AMPUTATION;  Surgeon: XLeandrew Koyanagi MD;  [place] [facility]  Service: Orthopedics;  Laterality: Right;  . SMALL INTESTINE SURGERY    . STOMACH SURGERY    . TOE AMPUTATION Right 08/2016   right great toe    Family History  Problem Relation Age of Onset  . Depression Brother   . Throat cancer Brother        half brother, never smoker  . COPD Mother   . Diabetes Neg Hx     Social History   Socioeconomic History  . Marital status: Single    Spouse name: Not on file  . Number of children: Not on file  . Years of education: Not on file  . Highest education level: Not on file  Occupational History    Comment: DISABILITY  Tobacco Use  . Smoking status: Former  Smoker    Packs/day: 0.10    Years: 10.00    Pack years: 1.00    Types: Cigars    Quit date: 08/09/2014    Years since quitting: 5.4  . Smokeless tobacco: Never Used  Vaping Use  . Vaping Use: Never used  Substance and Sexual Activity  . Alcohol use: No    Alcohol/week: 0.0 standard drinks  . Drug use: No  . Sexual activity: Not Currently    Partners: Male    Comment: pt. declined condoms  Other Topics Concern  . Not on file  Social History Narrative   Epworth Sleepiness Scale = 7 (as of 03/16/2015)   Social Determinants of Health   Financial Resource Strain:   . Difficulty of Paying Living Expenses: Not on file  Food Insecurity:   . Worried About RCharity fundraiserin the Last Year: Not on file  . Ran Out of Food in the Last Year: Not on file  Transportation Needs:   . Lack of Transportation (Medical): Not on file  . Lack of Transportation (Non-Medical): Not on file  Physical Activity:   . Days of Exercise per Week: Not on file  . Minutes of Exercise  per Session: Not on file  Stress:   . Feeling of Stress : Not on file  Social Connections:   . Frequency of Communication with Friends and Family: Not on file  . Frequency of Social Gatherings with Friends and Family: Not on file  . Attends Religious Services: Not on file  . Active Member of Clubs or Organizations: Not on file  . Attends Archivist Meetings: Not on file  . Marital Status: Not on file  Intimate Partner Violence:   . Fear of Current or Ex-Partner: Not on file  . Emotionally Abused: Not on file  . Physically Abused: Not on file  . Sexually Abused: Not on file    Outpatient Medications Prior to Visit  Medication Sig Dispense Refill  . abacavir-dolutegravir-lamiVUDine (TRIUMEQ) 600-50-300 MG tablet Take 1 tablet by mouth daily. (Patient taking differently: Take 1 tablet by mouth at bedtime. ) 30 tablet 6  . acetaminophen (TYLENOL) 500 MG tablet Take 1,500 mg by mouth in the morning and at  bedtime.     . ALPRAZolam (XANAX) 1 MG tablet Take 1 mg by mouth at bedtime. *May take one tablet up to 4 times daily as needed for anxiety    . amphetamine-dextroamphetamine (ADDERALL) 30 MG tablet Take 30 mg by mouth 3 (three) times daily.    . Brexpiprazole (REXULTI) 1 MG TABS Take 1 mg by mouth at bedtime.    . Continuous Blood Gluc Sensor (FREESTYLE LIBRE 14 DAY SENSOR) MISC USE AS DIRECTED EVERY 14 DAYS 6 each 3  . divalproex (DEPAKOTE ER) 500 MG 24 hr tablet TAKE 1 TABLET(500 MG) BY MOUTH AT BEDTIME 90 tablet 1  . ezetimibe (ZETIA) 10 MG tablet TAKE 1 TABLET(10 MG) BY MOUTH DAILY 90 tablet 1  . furosemide (LASIX) 40 MG tablet TAKE 1 TABLET(40 MG) BY MOUTH DAILY 90 tablet 1  . Insulin Pen Needle (B-D ULTRAFINE III SHORT PEN) 31G X 8 MM MISC 1 each by Other route 3 (three) times daily. E11.9 100 each 2  . levothyroxine (SYNTHROID, LEVOTHROID) 50 MCG tablet Take 1 tablet (50 mcg total) by mouth at bedtime. 30 tablet 11  . ondansetron (ZOFRAN) 8 MG tablet TAKE 1 TABLET(8 MG) BY MOUTH EVERY 8 HOURS AS NEEDED FOR NAUSEA OR VOMITING 20 tablet 1  . pantoprazole (PROTONIX) 40 MG tablet TAKE 1 TABLET(40 MG) BY MOUTH DAILY AT 6 AM 90 tablet 0  . polyethylene glycol (MIRALAX / GLYCOLAX) packet Take 17 g by mouth 2 (two) times daily. (Patient taking differently: Take 17 g by mouth 2 (two) times daily as needed for mild constipation or moderate constipation. ) 30 each 0  . protriptyline (VIVACTIL) 10 MG tablet Take 10 mg by mouth 3 (three) times daily.   11  . traMADol (ULTRAM) 50 MG tablet Take 1 tablet (50 mg total) by mouth 2 (two) times daily as needed. 30 tablet 1  . TRINTELLIX 20 MG TABS tablet Take 20 mg by mouth at bedtime.     [name] zolpidem (AMBIEN) 10 MG tablet Take 10 mg by mouth at bedtime.     . canagliflozin (INVOKANA) 100 MG TABS tablet Take 1 tablet (100 mg total) by mouth daily before breakfast. 30 tablet 11  . diclofenac Sodium (VOLTAREN) 1 % GEL APPLY 2 GRAMS TOPICALLY TO EACH KNEE IN THE  MORNING AND AT BEDTIME- APPLY 1 GRAM TO EACH KNEE IN THE AFTERNOON 300 g 0  . Dulaglutide (TRULICITY) 3 EN/2.7PO SOPN Inject 0.5 mLs (3 mg total)  into the skin once a week. (Patient taking differently: Inject 3 mg into the skin every Saturday. ) 12 pen 3  . Insulin Aspart FlexPen 100 UNIT/ML SOPN 15 units with breakfast, and 25 unit with supper 36 mL 0  . insulin glargine (LANTUS SOLOSTAR) 100 UNIT/ML Solostar Pen Inject 130 Units into the skin every morning. (Patient taking differently: Inject 150 Units into the skin every morning. ) 25 pen PRN  . pregabalin (LYRICA) 150 MG capsule Take 2 capsules (300 mg total) by mouth 2 (two) times daily. 360 capsule 1  . rivaroxaban (XARELTO) 20 MG TABS tablet TAKE 1 TABLET(20 MG) BY MOUTH DAILY (Patient taking differently: Take 20 mg by mouth daily with supper. ) 90 tablet 1   No facility-administered medications prior to visit.    Allergies  Allergen Reactions  . Aspirin Swelling  . Ibuprofen Swelling  . Sustiva [Efavirenz] Swelling and Rash  . Nsaids Other (See Comments)    unknwn  . Lipitor [Atorvastatin Calcium] Other (See Comments)    Leg pain    Review of Systems  Constitutional: Negative.   HENT: Negative.   Eyes: Negative.   Respiratory: Negative.   Cardiovascular: Negative.   Gastrointestinal: Negative.   Genitourinary: Negative.   Musculoskeletal: Negative.   Skin: Negative.   Neurological: Positive for dizziness.  Psychiatric/Behavioral: Negative.        Objective:    Physical Exam Vitals and nursing note reviewed.  Constitutional:      General: He is not in acute distress.    Appearance: Normal appearance. He is obese. He is not ill-appearing, toxic-appearing or diaphoretic.  Cardiovascular:     Rate and Rhythm: Normal rate and regular rhythm.     Heart sounds: Normal heart sounds.  Pulmonary:     Effort: Pulmonary effort is normal. No respiratory distress.     Breath sounds: Normal breath sounds.  Skin:    General:  Skin is warm and dry.  Neurological:     General: No focal deficit present.     Mental Status: He is alert and oriented to person, place, and time. Mental status is at baseline.  Psychiatric:        Mood and Affect: Mood normal.        Behavior: Behavior normal.        Thought Content: Thought content normal.        Judgment: Judgment normal.     BP 137/80 (BP Location: Right Arm, Patient Position: Sitting, Cuff Size: Large)   Pulse 87   Temp 98.3 F (36.8 C) (Temporal)   Ht 5' 11"  (1.803 m)   Wt 248 lb 9.6 oz (112.8 kg)   SpO2 94%   BMI 34.67 kg/m  Wt Readings from Last 3 Encounters:  01/04/20 252 lb 6.4 oz (114.5 kg)  01/02/20 259 lb (117.5 kg)  12/15/19 248 lb (112.5 kg)    Health Maintenance Due  Topic Date Due  . COVID-19 Vaccine (1) Never done  . PNA vac Low Risk Adult (2 of 2 - PPSV23) 11/17/2019    There are no preventive care reminders to display for this patient.   Lab Results  Component Value Date   TSH 0.108 (L) 09/14/2019   Lab Results  Component Value Date   WBC 6.0 09/15/2019   HGB 15.9 09/15/2019   HCT 46.8 09/15/2019   MCV 95.3 09/15/2019   PLT 125 (L) 09/15/2019   Lab Results  Component Value Date   NA 139 01/04/2020  K 4.4 01/04/2020   CO2 26 01/04/2020   GLUCOSE 74 01/04/2020   BUN 23 01/04/2020   CREATININE 1.56 (H) 01/04/2020   BILITOT 0.7 09/15/2019   ALKPHOS 78 09/15/2019   AST 21 09/15/2019   ALT 24 09/15/2019   PROT 6.7 09/15/2019   ALBUMIN 3.5 09/15/2019   CALCIUM 10.0 01/04/2020   ANIONGAP 10 09/15/2019   Lab Results  Component Value Date   CHOL 239 (H) 05/19/2019   Lab Results  Component Value Date   HDL 40 05/19/2019   Lab Results  Component Value Date   LDLCALC 155 (H) 05/19/2019   Lab Results  Component Value Date   TRIG 241 (H) 05/19/2019   Lab Results  Component Value Date   CHOLHDL 6.0 (H) 05/19/2019   Lab Results  Component Value Date   HGBA1C 6.2 (A) 08/24/2019       Assessment & Plan:    Problem List Items Addressed This Visit    None    Visit Diagnoses    Benign paroxysmal positional vertigo due to bilateral vestibular disorder    -  Primary   Relevant Medications   meclizine (ANTIVERT) 12.5 MG tablet   Other Relevant Orders   PT vestibular rehab       Meds ordered this encounter  Medications  . meclizine (ANTIVERT) 12.5 MG tablet    Sig: Take 1 tablet (12.5 mg total) by mouth 2 (two) times daily as needed for dizziness.    Dispense:  30 tablet    Refill:  0    Order Specific Question:   Supervising Provider    Answer:   Carlota Raspberry, JEFFREY R [[id number]]   PLAN  Meclizine  Refer to vestibular rehab  Return prn  Very low suspicion for any cv origin  Patient encouraged to call clinic with any questions, comments, or concerns.   Maximiano Coss, NP

## 2020-01-24 ENCOUNTER — Encounter: Payer: Self-pay | Admitting: Cardiovascular Disease

## 2020-01-30 DIAGNOSIS — G4733 Obstructive sleep apnea (adult) (pediatric): Secondary | ICD-10-CM | POA: Diagnosis not present

## 2020-01-31 ENCOUNTER — Other Ambulatory Visit: Payer: Self-pay

## 2020-01-31 ENCOUNTER — Ambulatory Visit (HOSPITAL_COMMUNITY): Payer: PPO | Attending: Internal Medicine

## 2020-01-31 DIAGNOSIS — R0602 Shortness of breath: Secondary | ICD-10-CM | POA: Insufficient documentation

## 2020-01-31 DIAGNOSIS — R609 Edema, unspecified: Secondary | ICD-10-CM | POA: Insufficient documentation

## 2020-01-31 LAB — ECHOCARDIOGRAM COMPLETE
Area-P 1/2: 3.39 cm2
S' Lateral: 2.6 cm

## 2020-01-31 MED ORDER — PERFLUTREN LIPID MICROSPHERE
1.0000 mL | INTRAVENOUS | Status: AC | PRN
  Administered 2020-01-31: 3 mL via INTRAVENOUS

## 2020-02-06 DIAGNOSIS — Z794 Long term (current) use of insulin: Secondary | ICD-10-CM | POA: Diagnosis not present

## 2020-02-06 DIAGNOSIS — H524 Presbyopia: Secondary | ICD-10-CM | POA: Diagnosis not present

## 2020-02-06 DIAGNOSIS — Z961 Presence of intraocular lens: Secondary | ICD-10-CM | POA: Diagnosis not present

## 2020-02-06 DIAGNOSIS — E119 Type 2 diabetes mellitus without complications: Secondary | ICD-10-CM | POA: Diagnosis not present

## 2020-02-06 LAB — HM DIABETES EYE EXAM

## 2020-02-07 ENCOUNTER — Telehealth: Payer: Self-pay | Admitting: Endocrinology

## 2020-02-07 NOTE — Telephone Encounter (Signed)
I called patient, advised that I did not have any record of any shots here with [name].  Patient states he may have received from Pharmacy- so he would check there.   Patient thankful for call back.

## 2020-02-07 NOTE — Telephone Encounter (Signed)
Patient requests to be called at ph# [phone] to be informed if Patient received his Pneumonia and Flu Shot at last office visit

## 2020-02-09 ENCOUNTER — Other Ambulatory Visit: Payer: Self-pay | Admitting: Family Medicine

## 2020-02-09 ENCOUNTER — Other Ambulatory Visit: Payer: Self-pay | Admitting: Endocrinology

## 2020-02-09 DIAGNOSIS — I10 Essential (primary) hypertension: Secondary | ICD-10-CM

## 2020-02-09 DIAGNOSIS — K219 Gastro-esophageal reflux disease without esophagitis: Secondary | ICD-10-CM

## 2020-02-09 DIAGNOSIS — G8929 Other chronic pain: Secondary | ICD-10-CM

## 2020-02-09 DIAGNOSIS — M25562 Pain in left knee: Secondary | ICD-10-CM

## 2020-02-09 NOTE — Telephone Encounter (Signed)
Patient will run out before his appointment- Rx RF'd with no additional.

## 2020-02-09 NOTE — Telephone Encounter (Signed)
Requested medication (s) are due for refill today - unsure  Requested medication (s) are on the active medication list -no  Future visit scheduled -yes  Last refill: 10/21/19  Notes to clinic: Request for discontinued medication- no longer on current list  Requested Prescriptions  Pending Prescriptions Disp Refills   amLODipine (NORVASC) 5 MG tablet [Pharmacy Med Name: AMLODIPINE BESYLATE 5MG TABLETS] 90 tablet 1    Sig: TAKE 1 TABLET(5 MG) BY MOUTH DAILY      Cardiovascular:  Calcium Channel Blockers Passed - 02/09/2020  3:16 PM      Passed - Last BP in normal range    BP Readings from Last 1 Encounters:  01/04/20 122/68          Passed - Valid encounter within last 6 months    Recent Outpatient Visits           2 months ago Benign paroxysmal positional vertigo due to bilateral vestibular disorder   [facility], NP   3 months ago Right-sided low back pain with right-sided sciatica, unspecified chronicity   [facility], Ranell Patrick, MD   4 months ago Personal history of fall   [facility], Ranell Patrick, MD   5 months ago Right-sided low back pain with right-sided sciatica, unspecified chronicity   [facility], Ranell Patrick, MD   8 months ago Right-sided low back pain with right-sided sciatica, unspecified chronicity   [facility], Ranell Patrick, MD       Future Appointments             In 2 weeks [name], MD [facility] [place], [facility]   In 2 months Aundra Dubin, PA-C [place]                 Requested Prescriptions  Pending Prescriptions Disp Refills   amLODipine ([place]) 5 MG tablet [Pharmacy Med Name: AMLODIPINE BESYLATE 5MG TABLETS] 90 tablet 1    Sig: TAKE 1 TABLET(5 MG) BY MOUTH DAILY      Cardiovascular:  Calcium Channel Blockers Passed - 02/09/2020  3:16 PM      Passed - Last BP in normal range    BP Readings from Last 1  Encounters:  01/04/20 122/68          Passed - Valid encounter within last 6 months    Recent Outpatient Visits           2 months ago Benign paroxysmal positional vertigo due to bilateral vestibular disorder   [facility], NP   3 months ago Right-sided low back pain with right-sided sciatica, unspecified chronicity   [facility], MD   4 months ago Personal history of fall   [facility], Ranell Patrick, MD   5 months ago Right-sided low back pain with right-sided sciatica, unspecified chronicity   [facility], Ranell Patrick, MD   8 months ago Right-sided low back pain with right-sided sciatica, unspecified chronicity   [facility], Ranell Patrick, MD       Future Appointments             In 2 weeks [name], MD [facility] [place], [facility]   In 2 months Nathaniel Man [place]

## 2020-02-16 ENCOUNTER — Ambulatory Visit: Payer: PPO | Admitting: Family Medicine

## 2020-02-21 ENCOUNTER — Encounter: Payer: Self-pay | Admitting: Orthopaedic Surgery

## 2020-02-21 ENCOUNTER — Ambulatory Visit (INDEPENDENT_AMBULATORY_CARE_PROVIDER_SITE_OTHER): Payer: PPO

## 2020-02-21 ENCOUNTER — Other Ambulatory Visit: Payer: Self-pay

## 2020-02-21 ENCOUNTER — Ambulatory Visit: Payer: PPO | Admitting: Orthopaedic Surgery

## 2020-02-21 DIAGNOSIS — Z89431 Acquired absence of right foot: Secondary | ICD-10-CM

## 2020-02-21 MED ORDER — MUPIROCIN 2 % EX OINT: 1.0000 "application " | TOPICAL_OINTMENT | Freq: Two times a day (BID) | CUTANEOUS | 0 refills | Status: DC

## 2020-02-21 NOTE — Progress Notes (Signed)
Office Visit Note   Patient: Daniel [name]           Date of Birth: 02/17/1956           MRN: [id number] Visit Date: 02/21/2020              Requested by: [name], MD [address] [place],  [place] [address] PCP: [name], MD   Assessment & Plan: Visit Diagnoses:  1. S/P transmetatarsal amputation of foot, right ([place])     Plan: Impression is right foot superficial wound not concerning for infection.  At this point, will apply mupirocin and a Band-Aid.  He will continue doing this twice a day until it is healed.  He will follow up with [name] as needed.  He has been instructed to call us with any concerns or questions or any worsening symptoms.  Follow-Up Instructions: Return if symptoms worsen or fail to improve.   Orders:  Orders Placed This Encounter  Procedures  . XR Foot Complete Right   Meds ordered this encounter  Medications  . mupirocin ointment (BACTROBAN) 2 %    Sig: Apply 1 application topically 2 (two) times daily.    Dispense:  22 g    Refill:  0      Procedures: No procedures performed   Clinical Data: No additional findings.   Subjective: Chief Complaint  Patient presents with  . Right Foot - Follow-up    HPI patient is a pleasant 64 year old gentleman who comes in today with concerns about his right foot.  He is status post right foot transmetatarsal amputation from a few years back.  Doing well until he recently cut his foot is gravel driveway.  He thinks this happened a few weeks ago.  He denies any pain but has a history of neuropathy.  He has not noticed any foul odor.  He initially applied Betadine but has not been using anything since.  No fevers or chills.    Review of Systems as detailed in HPI.  All others reviewed and are negative.   Objective: Vital Signs: There were no vitals taken for this visit.  Physical Exam well-developed well-nourished gentleman in no acute distress.  Alert oriented x3.  Ortho Exam right  foot exam shows a superficial cut to the epidermis with underlying superficial hematoma.  No evidence of infection.    Specialty Comments:  No specialty comments available.  Imaging: XR Foot Complete Right  Result Date: 02/21/2020 Previous transmetatarsal amputation without acute or structural abnormalities    PMFS History: Patient Active Problem List   Diagnosis Date Noted  . Unilateral primary osteoarthritis, right knee 12/15/2019  . Acute encephalopathy   . Generalized weakness 09/14/2019  . Severe major depression ([place]) 11/17/2018  . Class 2 obesity due to excess calories without serious comorbidity with body mass index (BMI) of 36.0 to 36.9 in adult 11/17/2018  . Diabetes ([place]) 11/09/2018  . Anticoagulated 08/24/2018  . Dyslipidemia 07/27/2018  . Essential hypertension 06/25/2018  . Hypogonadism in male 05/10/2018  . Numbness 03/31/2018  . Diabetic peripheral neuropathy ([place]) 01/25/2018  . History of syncope 01/25/2018  . S/P transmetatarsal amputation of foot, right ([place]) 10/09/2017  . Osteomyelitis ([place])   . Hypothyroidism   . Constipation   . History of DVT (deep vein thrombosis) 09/30/2017  . Obesity, Class III, BMI 40-49.9 (morbid obesity) ([place]) 01/15/2017  . DOE (dyspnea on exertion) 01/13/2017  . Chronic migraine 09/17/2016  . Gait abnormality 09/17/2016  .  Diabetic foot infection ([place]) 06/28/2016  . Fall 12/05/2015  . Toe ulcer, right ([place]) 09/19/2015  . Decreased pedal pulses 09/19/2015  . OSA on CPAP 09/05/2015  . Major depressive disorder, recurrent episode, moderate ([place]) 09/05/2015  . Low back pain 06/11/2015  . Abnormality of gait 06/11/2015  . Dizziness 03/17/2015  . Weakness 02/21/2015  . Chronic renal insufficiency, stage III (moderate) ([place]) 08/09/2014  . Insulin-requiring or dependent type II diabetes mellitus ([place]) 11/07/2013  . Hematuria 06/21/2013  . Hepatic steatosis 09/09/2010  . Urinary tract infection without hematuria 09/15/2006  .  Human immunodeficiency virus (HIV) disease ([place]) 06/04/2006  . HERPES ZOSTER, UNCOMPLICATED 40/98/1191  . Depression 06/04/2006  . THROMBOPHLEBITIS NOS 06/04/2006  . GERD 06/04/2006  . ARTHRITIS, HAND 06/04/2006   Past Medical History:  Diagnosis Date  . ADHD (attention deficit hyperactivity disorder)   . Anxiety   . Chronic kidney disease   . Clotting disorder ([place])   . Depression   . Diabetes mellitus without complication ([place])   . Diabetes mellitus, type II ([place])   . Dizziness 03/17/2015  . Essential hypertension 06/25/2018  . GERD (gastroesophageal reflux disease)   . HIV disease ([place])   . HIV infection ([place])   . Liver disease   . OSA (obstructive sleep apnea) 07/25/2015   Uses CPAP regularly  . Peripheral vascular disease ([place])   . Ulcer     Family History  Problem Relation Age of Onset  . Depression Brother   . Throat cancer Brother        half brother, never smoker  . COPD Mother   . Diabetes Neg Hx     Past Surgical History:  Procedure Laterality Date  . AMPUTATION Right 10/02/2017   Procedure: RIGHT TRANSMETATARSAL AMPUTATION;  Surgeon: Leandrew Koyanagi, MD;  Location: [facility];  Service: Orthopedics;  Laterality: Right;  . SMALL INTESTINE SURGERY    . STOMACH SURGERY    . TOE AMPUTATION Right 08/2016   right great toe   Social History   Occupational History    Comment: DISABILITY  Tobacco Use  . Smoking status: Former Smoker    Packs/day: 0.10    Years: 10.00    Pack years: 1.00    Types: Cigars    Quit date: 08/09/2014    Years since quitting: 5.5  . Smokeless tobacco: Never Used  Vaping Use  . Vaping Use: Never used  Substance and Sexual Activity  . Alcohol use: No    Alcohol/week: 0.0 standard drinks  . Drug use: No  . Sexual activity: Not Currently    Partners: Male    Comment: pt. declined condoms

## 2020-02-23 ENCOUNTER — Telehealth (INDEPENDENT_AMBULATORY_CARE_PROVIDER_SITE_OTHER): Payer: PPO | Admitting: Family Medicine

## 2020-02-23 ENCOUNTER — Other Ambulatory Visit: Payer: Self-pay

## 2020-02-23 DIAGNOSIS — Z8669 Personal history of other diseases of the nervous system and sense organs: Secondary | ICD-10-CM

## 2020-02-23 DIAGNOSIS — Z794 Long term (current) use of insulin: Secondary | ICD-10-CM | POA: Diagnosis not present

## 2020-02-23 DIAGNOSIS — N183 Chronic kidney disease, stage 3 unspecified: Secondary | ICD-10-CM

## 2020-02-23 DIAGNOSIS — E1122 Type 2 diabetes mellitus with diabetic chronic kidney disease: Secondary | ICD-10-CM

## 2020-02-23 DIAGNOSIS — R7989 Other specified abnormal findings of blood chemistry: Secondary | ICD-10-CM

## 2020-02-23 MED ORDER — DIVALPROEX SODIUM ER 500 MG PO TB24
ORAL_TABLET | ORAL | 0 refills | Status: DC
Start: 2020-02-23 — End: 2020-08-15

## 2020-02-23 NOTE — Progress Notes (Signed)
Virtual Visit via audio Note 5:38 PM - called pt - no answer, left voicemail. Will try again.  I connected with Daniel [name] on 02/23/20 at 5:38 PM by audio and verified that I am speaking with the correct person using two identifiers.  Patient location: home My location: home.   I discussed the limitations, risks, security and privacy concerns of performing an evaluation and management service by telephone and the availability of in person appointments. I also discussed with the patient that there may be a patient responsible charge related to this service. The patient expressed understanding and agreed to proceed, consent obtained  Chief complaint:  Chief Complaint  Patient presents with  . LAB WORK REVIEW    Pt is unsure what blood/ lab work he needs reviewed with the provider. Pt states no other concerns to speak with the provider about at this time   History of Present Illness: Daniel [name] is a 64 y.o. male   Diabetes: Insulin-dependent, followed by endocrinology.  History of microalbuminuria with ratio 69 in July, as well as diabetic neuropathy.  Urinalysis ordered by cardiology November 15 with concern about diabetes control as glycosuria, proteinuria noted.  3+ glucose, 1+ protein.  Concern addressed with his endocrinologist with my chart message noted November 17.  Glucose can spill in the urine if it is high in the blood even for short time, plan for continuing same dose of insulin, last A1c in June was 6.2.  Had home visit from insurance company few weeks ago. Concern about his blood sugar? Unknown specifics, but sent in paperwork. Has had variable readings including in the 50's - feels dizzy and sweaty if this occurs, once in past 6 months. Denies recent 58', sometimes in 18's - not having any symptoms at those levels. Has made his endocrinologist aware of these readings. Currently 84, highest in 200 - no 300-400 readings recently. A1c 5.6 on 11/8- decreased lantus to 120u in  the morning. appt 04/04/19.  Home meter average states 85% of the time he is between 70-180 Myalgias with statin, takes zetia.   Lab Results  Component Value Date   HGBA1C 6.2 (A) 08/24/2019   HGBA1C 7.2 (A) 05/11/2019   HGBA1C 7.5 (A) 02/01/2019   Lab Results  Component Value Date   MICROALBUR 107.5 (H) 08/01/2014   LDLCALC 155 (H) 05/19/2019   CREATININE 1.56 (H) 01/04/2020   Hypothyroidism: Lab Results  Component Value Date   TSH 0.108 (L) 09/14/2019  synthroid decreased to 25 mcg daily after slightly low TSH in July.  Free T4 was normal at 1.04 on July 22.      Patient Active Problem List   Diagnosis Date Noted  . Unilateral primary osteoarthritis, right knee 12/15/2019  . Acute encephalopathy   . Generalized weakness 09/14/2019  . Severe major depression ([place]) 11/17/2018  . Class 2 obesity due to excess calories without serious comorbidity with body mass index (BMI) of 36.0 to 36.9 in adult 11/17/2018  . Diabetes ([place]) 11/09/2018  . Anticoagulated 08/24/2018  . Dyslipidemia 07/27/2018  . Essential hypertension 06/25/2018  . Hypogonadism in male 05/10/2018  . Numbness 03/31/2018  . Diabetic peripheral neuropathy ([place]) 01/25/2018  . History of syncope 01/25/2018  . S/P transmetatarsal amputation of foot, right ([place]) 10/09/2017  . Osteomyelitis ([place])   . Hypothyroidism   . Constipation   . History of DVT (deep vein thrombosis) 09/30/2017  . Obesity, Class III, BMI 40-49.9 (morbid obesity) ([place]) 01/15/2017  . DOE (dyspnea on  exertion) 01/13/2017  . Chronic migraine 09/17/2016  . Gait abnormality 09/17/2016  . Diabetic foot infection ([place]) 06/28/2016  . Fall 12/05/2015  . Toe ulcer, right ([place]) 09/19/2015  . Decreased pedal pulses 09/19/2015  . OSA on CPAP 09/05/2015  . Major depressive disorder, recurrent episode, moderate ([place]) 09/05/2015  . Low back pain 06/11/2015  . Abnormality of gait 06/11/2015  . Dizziness 03/17/2015  . Weakness 02/21/2015  . Chronic  renal insufficiency, stage III (moderate) ([place]) 08/09/2014  . Insulin-requiring or dependent type II diabetes mellitus ([place]) 11/07/2013  . Hematuria 06/21/2013  . Hepatic steatosis 09/09/2010  . Urinary tract infection without hematuria 09/15/2006  . Human immunodeficiency virus (HIV) disease ([place]) 06/04/2006  . HERPES ZOSTER, UNCOMPLICATED 44/04/4740  . Depression 06/04/2006  . THROMBOPHLEBITIS NOS 06/04/2006  . GERD 06/04/2006  . ARTHRITIS, HAND 06/04/2006   Past Medical History:  Diagnosis Date  . ADHD (attention deficit hyperactivity disorder)   . Anxiety   . Chronic kidney disease   . Clotting disorder ([place])   . Depression   . Diabetes mellitus without complication ([place])   . Diabetes mellitus, type II ([place])   . Dizziness 03/17/2015  . Essential hypertension 06/25/2018  . GERD (gastroesophageal reflux disease)   . HIV disease ([place])   . HIV infection ([place])   . Liver disease   . OSA (obstructive sleep apnea) 07/25/2015   Uses CPAP regularly  . Peripheral vascular disease ([place])   . Ulcer    Past Surgical History:  Procedure Laterality Date  . AMPUTATION Right 10/02/2017   Procedure: RIGHT TRANSMETATARSAL AMPUTATION;  Surgeon: Leandrew Koyanagi, MD;  Location: [facility];  Service: Orthopedics;  Laterality: Right;  . SMALL INTESTINE SURGERY    . STOMACH SURGERY    . TOE AMPUTATION Right 08/2016   right great toe   Allergies  Allergen Reactions  . Aspirin Swelling  . Ibuprofen Swelling  . Sustiva [Efavirenz] Swelling and Rash  . Nsaids Other (See Comments)    unknwn  . Lipitor [Atorvastatin Calcium] Other (See Comments)    Leg pain   Prior to Admission medications   Medication Sig Start Date End Date Taking? Authorizing Provider  abacavir-dolutegravir-lamiVUDine (TRIUMEQ) 600-50-300 MG tablet Take 1 tablet by mouth daily. Patient taking differently: Take 1 tablet by mouth at bedtime. 09/13/19  Yes Golden Circle, FNP  acetaminophen (TYLENOL) 500 MG tablet Take 1,500 mg by  mouth in the morning and at bedtime.   Yes [provider]  ALPRAZolam Duanne Moron) 1 MG tablet Take 1 mg by mouth at bedtime. *May take one tablet up to 4 times daily as needed for anxiety   Yes [provider]  amphetamine-dextroamphetamine (ADDERALL) 30 MG tablet Take 30 mg by mouth 3 (three) times daily.   Yes [provider]  brexpiprazole (REXULTI) 1 MG TABS tablet Take 1 mg by mouth at bedtime.   Yes [provider]  Continuous Blood Gluc Sensor (FREESTYLE LIBRE 14 DAY SENSOR) MISC USE AS DIRECTED EVERY 14 DAYS 11/18/19  Yes Renato Shin, MD  diclofenac Sodium (VOLTAREN) 1 % GEL APPLY 2 GRAMS TOPICALLY TO EACH KNEE IN THE MORNING AND AT BEDTIME- APPLY 1 GRAM TO EACH KNEE IN THE AFTERNOON 02/09/20  Yes [name], MD  divalproex (DEPAKOTE ER) 500 MG 24 hr tablet TAKE 1 TABLET(500 MG) BY MOUTH AT BEDTIME 09/27/19  Yes [name], MD  ezetimibe (ZETIA) 10 MG tablet TAKE 1 TABLET(10 MG) BY MOUTH DAILY 09/26/19  Yes Skeet Latch,  MD  furosemide (LASIX) 40 MG tablet TAKE 1 TABLET(40 MG) BY MOUTH DAILY 09/26/19  Yes Skeet Latch, MD  Insulin Aspart FlexPen 100 UNIT/ML SOPN INJECT 15 UNITS UNDER THE SKIN WITH BREAKFAST AND 25 UNITS WITH SUPPER 02/09/20  Yes Renato Shin, MD  Insulin Pen Needle (B-D ULTRAFINE III SHORT PEN) 31G X 8 MM MISC 1 each by Other route 3 (three) times daily. E11.9 08/30/19  Yes Renato Shin, MD  INVOKANA 100 MG TABS tablet TAKE 1 TABLET(100 MG) BY MOUTH DAILY BEFORE BREAKFAST 01/11/20  Yes Renato Shin, MD  LANTUS SOLOSTAR 100 UNIT/ML Solostar Pen ADMINISTER 230 UNITS UNDER THE SKIN EVERY MORNING 02/09/20  Yes Renato Shin, MD  levothyroxine (SYNTHROID, LEVOTHROID) 50 MCG tablet Take 1 tablet (50 mcg total) by mouth at bedtime. 07/24/15  Yes [name], MD  meclizine (ANTIVERT) 12.5 MG tablet Take 1 tablet (12.5 mg total) by mouth 2 (two) times daily as needed for dizziness. 11/18/19  Yes Maximiano Coss, NP  mupirocin  ointment (BACTROBAN) 2 % Apply 1 application topically 2 (two) times daily. 02/21/20  Yes Dwana Melena L, PA-C  ondansetron (ZOFRAN) 8 MG tablet TAKE 1 TABLET(8 MG) BY MOUTH EVERY 8 HOURS AS NEEDED FOR NAUSEA OR VOMITING 10/21/19  Yes Comer, Okey Regal, MD  pantoprazole (PROTONIX) 40 MG tablet TAKE 1 TABLET(40 MG) BY MOUTH DAILY AT 6 AM 02/09/20  Yes [name], MD  polyethylene glycol (MIRALAX / GLYCOLAX) packet Take 17 g by mouth 2 (two) times daily. Patient taking differently: Take 17 g by mouth 2 (two) times daily as needed for mild constipation or moderate constipation. 10/05/17  Yes Eugenie Filler, MD  pregabalin (LYRICA) 150 MG capsule TAKE 2 CAPSULES BY MOUTH TWICE DAILY 12/19/19  Yes [name], MD  protriptyline (VIVACTIL) 10 MG tablet Take 10 mg by mouth 3 (three) times daily.  01/30/16  Yes [provider]  rivaroxaban (XARELTO) 20 MG TABS tablet Take 1 tablet (20 mg total) by mouth daily with supper. 12/19/19  Yes [name], MD  tobramycin-dexamethasone [facility]) ophthalmic solution Instill one drop OS QID x 7 days 10/18/19  Yes [provider]  TRINTELLIX 20 MG TABS tablet Take 20 mg by mouth at bedtime.  01/26/15  Yes [provider]  TRULICITY 3 VC/9.4WH SOPN INJECT 0.5ML(3MG TOTAL) INTO THE SKIN ONCE A WEEK 12/19/19  Yes Renato Shin, MD  zolpidem (AMBIEN) 10 MG tablet Take 10 mg by mouth at bedtime.   Yes [provider]  traMADol (ULTRAM) 50 MG tablet Take 1 tablet (50 mg total) by mouth 2 (two) times daily as needed. 11/09/19   Aundra Dubin, PA-C   Social History   Socioeconomic History  . Marital status: Single    Spouse name: Not on file  . Number of children: Not on file  . Years of education: Not on file  . Highest education level: Not on file  Occupational History    Comment: DISABILITY  Tobacco Use  . Smoking status: Former Smoker    Packs/day: 0.10    Years: 10.00    Pack years: 1.00    Types: Cigars     Quit date: 08/09/2014    Years since quitting: 5.5  . Smokeless tobacco: Never Used  Vaping Use  . Vaping Use: Never used  Substance and Sexual Activity  . Alcohol use: No    Alcohol/week: 0.0 standard drinks  . Drug use: No  . Sexual activity: Not Currently  Partners: Male    Comment: pt. declined condoms  Other Topics Concern  . Not on file  Social History Narrative   Epworth Sleepiness Scale = 7 (as of 03/16/2015)   Social Determinants of Health   Financial Resource Strain: Not on file  Food Insecurity: Not on file  Transportation Needs: Not on file  Physical Activity: Not on file  Stress: Not on file  Social Connections: Not on file  Intimate Partner Violence: Not on file    Observations/Objective: There were no vitals filed for this visit. Appropriate responses, no distress appreciated on phone call, all questions were answered with understanding of plan expressed.  Assessment and Plan: Type 2 diabetes mellitus with stage 3 chronic kidney disease, with long-term current use of insulin, unspecified whether stage 3a or 3b CKD ([place])  - followed by endocrinology. Will review paperwork from nurse visit at home, but no changes recommended at this point. Follow up with [name] as planned in February, hypoglycemia precautions.   History of migraine - Plan: divalproex (DEPAKOTE ER) 500 MG 24 hr tablet  - stable, refill depakote for now.   Low TSH level  - reports thyroid med managed by psychiatry - can have repeat testing there or with endocrine in February.   3 month recheck in office - likely will be due for fasting labs.   Follow Up Instructions: Keep follow-up with endocrinologist in February, 85-monthand office follow-up with me, possible fasting labs needed at that time   I discussed the assessment and treatment plan with the patient. The patient was provided an opportunity to ask questions and all were answered. The patient agreed with the plan and  demonstrated an understanding of the instructions.   The patient was advised to call back or seek an in-person evaluation if the symptoms worsen or if the condition fails to improve as anticipated.  I provided 21 minutes of non-face-to-face time during this encounter.   JWendie Agreste MD

## 2020-02-23 NOTE — Patient Instructions (Addendum)
  Keep follow with [name] in February to discuss diabetes and can recheck thyroid test at that time if needed as well.   If you have lab work done today you will be contacted with your lab results within the next 2 weeks.  If you have not heard from [name] then please contact us. The fastest way to get your results is to register for My Chart.   IF you received an x-ray today, you will receive an invoice from [facility]. Please contact [facility] at [phone] with questions or concerns regarding your invoice.   IF you received labwork today, you will receive an invoice from [facility]. Please contact [facility] at [phone] with questions or concerns regarding your invoice.   Our billing staff will not be able to assist you with questions regarding bills from these companies.  You will be contacted with the lab results as soon as they are available. The fastest way to get your results is to activate your My Chart account. Instructions are located on the last page of this paperwork. If you have not heard from [name] regarding the results in 2 weeks, please contact this office.

## 2020-02-27 NOTE — Progress Notes (Signed)
Called pt and made appt

## 2020-02-28 ENCOUNTER — Other Ambulatory Visit: Payer: PPO

## 2020-02-28 ENCOUNTER — Ambulatory Visit: Payer: PPO

## 2020-02-28 ENCOUNTER — Other Ambulatory Visit: Payer: Self-pay

## 2020-02-28 DIAGNOSIS — B20 Human immunodeficiency virus [HIV] disease: Secondary | ICD-10-CM

## 2020-02-28 DIAGNOSIS — Z113 Encounter for screening for infections with a predominantly sexual mode of transmission: Secondary | ICD-10-CM

## 2020-02-28 DIAGNOSIS — Z79899 Other long term (current) drug therapy: Secondary | ICD-10-CM

## 2020-03-13 ENCOUNTER — Other Ambulatory Visit: Payer: Self-pay | Admitting: Family Medicine

## 2020-03-13 ENCOUNTER — Other Ambulatory Visit: Payer: Self-pay | Admitting: Endocrinology

## 2020-03-13 ENCOUNTER — Other Ambulatory Visit: Payer: Self-pay | Admitting: Cardiovascular Disease

## 2020-03-13 DIAGNOSIS — I824Y9 Acute embolism and thrombosis of unspecified deep veins of unspecified proximal lower extremity: Secondary | ICD-10-CM

## 2020-03-13 DIAGNOSIS — E785 Hyperlipidemia, unspecified: Secondary | ICD-10-CM

## 2020-03-13 DIAGNOSIS — K219 Gastro-esophageal reflux disease without esophagitis: Secondary | ICD-10-CM

## 2020-03-13 DIAGNOSIS — G8929 Other chronic pain: Secondary | ICD-10-CM

## 2020-03-13 DIAGNOSIS — E1142 Type 2 diabetes mellitus with diabetic polyneuropathy: Secondary | ICD-10-CM

## 2020-03-13 NOTE — Telephone Encounter (Signed)
Requested medication (s) are due for refill today:   Provider to determine  Requested medication (s) are on the active medication list:   Yes  Future visit scheduled:   Yes   Last ordered: 12/19/2019 #360, 0 refills  Non delegated refill   Requested Prescriptions  Pending Prescriptions Disp Refills   pregabalin (LYRICA) 150 MG capsule [Pharmacy Med Name: PREGABALIN 150MG CAPSULES] 360 capsule     Sig: TAKE 2 CAPSULE BY MOUTH TWICE DAILY      Not Delegated - Neurology:  Anticonvulsants - Controlled Failed - 03/13/2020  3:16 PM      Failed - This refill cannot be delegated      Passed - Valid encounter within last 12 months    Recent Outpatient Visits           2 weeks ago Type 2 diabetes mellitus with stage 3 chronic kidney disease, with long-term current use of insulin, unspecified whether stage 3a or 3b CKD ([place])   [facility], Ranell Patrick, MD   3 months ago Benign paroxysmal positional vertigo due to bilateral vestibular disorder   [facility] at [place], NP   4 months ago Right-sided low back pain with right-sided sciatica, unspecified chronicity   [facility], Ranell Patrick, MD   5 months ago Personal history of fall   [facility], Ranell Patrick, MD   6 months ago Right-sided low back pain with right-sided sciatica, unspecified chronicity   [facility], Ranell Patrick, MD       Future Appointments             In 1 month Aundra Dubin, PA-C [place]   In 2 months Carlota Raspberry, Ranell Patrick, MD Primary [facility] at [place], [facility]

## 2020-03-14 ENCOUNTER — Encounter: Payer: PPO | Admitting: Internal Medicine

## 2020-03-21 ENCOUNTER — Encounter: Payer: Self-pay | Admitting: Internal Medicine

## 2020-03-21 ENCOUNTER — Ambulatory Visit (INDEPENDENT_AMBULATORY_CARE_PROVIDER_SITE_OTHER): Payer: PPO | Admitting: Internal Medicine

## 2020-03-21 ENCOUNTER — Other Ambulatory Visit: Payer: Self-pay | Admitting: Internal Medicine

## 2020-03-21 ENCOUNTER — Other Ambulatory Visit: Payer: Self-pay

## 2020-03-21 VITALS — BP 180/100 | HR 105 | Temp 97.3°F | Wt 264.0 lb

## 2020-03-21 DIAGNOSIS — B20 Human immunodeficiency virus [HIV] disease: Secondary | ICD-10-CM

## 2020-03-21 DIAGNOSIS — Z113 Encounter for screening for infections with a predominantly sexual mode of transmission: Secondary | ICD-10-CM

## 2020-03-21 DIAGNOSIS — Z79899 Other long term (current) drug therapy: Secondary | ICD-10-CM | POA: Diagnosis not present

## 2020-03-21 DIAGNOSIS — N1831 Chronic kidney disease, stage 3a: Secondary | ICD-10-CM | POA: Diagnosis not present

## 2020-03-22 ENCOUNTER — Encounter: Payer: Self-pay | Admitting: Internal Medicine

## 2020-03-22 LAB — T-HELPER CELL (CD4) - (RCID CLINIC ONLY)
CD4 % Helper T Cell: 37 % (ref 33–65)
CD4 T Cell Abs: 863 /uL (ref 400–1790)

## 2020-03-22 NOTE — Assessment & Plan Note (Signed)
His renal function has been stable and will check with labs today.

## 2020-03-22 NOTE — Assessment & Plan Note (Signed)
Will screen today. He has not been sexually active.

## 2020-03-22 NOTE — Progress Notes (Signed)
   Subjective:    Patient ID: Daniel [name], male    DOB: [birth year], 65 y.o.   MRN: [id number]  HPI He is here for follow up of HIV He continues on Triumeq with no missed doses.  He has had no issues getting, taking or tolerating the medication. He has had his COVID-19 vaccines.  Flu shot given in September. 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    Review of Systems  Constitutional: Negative for unexpected weight change.  Gastrointestinal: Negative for diarrhea and nausea.  Skin: Negative for rash.       Objective:   Physical Exam Eyes:     General: No scleral icterus. Cardiovascular:     Rate and Rhythm: Normal rate and regular rhythm.  Pulmonary:     Effort: Pulmonary effort is normal.  Neurological:     General: No focal deficit present.     Mental Status: He is alert.  Psychiatric:        Mood and Affect: Mood normal.   SH: remains tobacco free        Assessment & Plan:

## 2020-03-22 NOTE — Assessment & Plan Note (Signed)
Lipid panel today

## 2020-03-22 NOTE — Assessment & Plan Note (Signed)
He is doing well with this and no concerns.  No indication to change his medications though can consider Dovato in the future.   He will get labs in 6 months and can see me in 1 year in January 2023 and will repeat labs at the visit then.   He will call if he has any issues otherwise during the year.

## 2020-03-24 LAB — HIV-1 RNA QUANT-NO REFLEX-BLD
HIV 1 RNA Quant: <20 Copies/mL — ABNORMAL HIGH
HIV-1 RNA Quant, Log: <1.3 Log cps/mL — ABNORMAL HIGH

## 2020-04-03 ENCOUNTER — Other Ambulatory Visit: Payer: Self-pay

## 2020-04-03 ENCOUNTER — Ambulatory Visit: Payer: PPO | Admitting: Endocrinology

## 2020-04-04 ENCOUNTER — Ambulatory Visit: Payer: PPO | Admitting: Endocrinology

## 2020-04-11 ENCOUNTER — Other Ambulatory Visit: Payer: Self-pay | Admitting: Family

## 2020-04-11 ENCOUNTER — Other Ambulatory Visit: Payer: Self-pay | Admitting: Family Medicine

## 2020-04-11 DIAGNOSIS — G8929 Other chronic pain: Secondary | ICD-10-CM

## 2020-04-11 DIAGNOSIS — E1142 Type 2 diabetes mellitus with diabetic polyneuropathy: Secondary | ICD-10-CM

## 2020-04-11 DIAGNOSIS — B20 Human immunodeficiency virus [HIV] disease: Secondary | ICD-10-CM

## 2020-04-11 DIAGNOSIS — M25562 Pain in left knee: Secondary | ICD-10-CM

## 2020-04-11 NOTE — Telephone Encounter (Signed)
Requested medication (s) are due for refill today: yes  Requested medication (s) are on the active medication list: yes  Last refill:  03/14/20 #120 0 refills   Future visit scheduled: yes  Notes to clinic:  not delegated per protocol     Requested Prescriptions  Pending Prescriptions Disp Refills   pregabalin (LYRICA) 150 MG capsule [Pharmacy Med Name: PREGABALIN 150MG CAPSULES] 360 capsule     Sig: TAKE 2 CAPSULE BY MOUTH TWICE DAILY      Not Delegated - Neurology:  Anticonvulsants - Controlled Failed - 04/11/2020  5:05 PM      Failed - This refill cannot be delegated      Passed - Valid encounter within last 12 months    Recent Outpatient Visits           1 month ago Type 2 diabetes mellitus with stage 3 chronic kidney disease, with long-term current use of insulin, unspecified whether stage 3a or 3b CKD ([place])   [facility], Ranell Patrick, MD   4 months ago Benign paroxysmal positional vertigo due to bilateral vestibular disorder   [facility], [place], NP   5 months ago Right-sided low back pain with right-sided sciatica, unspecified chronicity   [facility], Ranell Patrick, MD   6 months ago Personal history of fall   [facility], Ranell Patrick, MD   7 months ago Right-sided low back pain with right-sided sciatica, unspecified chronicity   [facility], Ranell Patrick, MD       Future Appointments             In 3 weeks Aundra Dubin, PA-C [place]   In 1 month [name], MD Primary Care at [place], [facility]

## 2020-04-12 ENCOUNTER — Other Ambulatory Visit: Payer: Self-pay | Admitting: Endocrinology

## 2020-04-12 MED ORDER — PREGABALIN 150 MG PO CAPS: ORAL_CAPSULE | ORAL | 0 refills | Status: DC

## 2020-04-12 MED ORDER — PREGABALIN 150 MG PO CAPS
ORAL_CAPSULE | ORAL | 0 refills | Status: DC
Start: 2020-04-12 — End: 2020-09-25

## 2020-04-12 NOTE — Addendum Note (Signed)
Addended by: [name] R on: 04/12/2020 08:54 AM   Modules accepted: Orders

## 2020-04-12 NOTE — Telephone Encounter (Signed)
Controlled substance database (PDMP) reviewed. No concerns appreciated.  Pregabalin filled on 02/13/2020.refill ordered.

## 2020-04-12 NOTE — Telephone Encounter (Signed)
Dr Carlota Raspberry, I forgot that lyrica was a control substance and I refilled it. It printed. Here is the below info can you look to see if it is ok to refill. Requested Prescriptions   Signed Prescriptions Disp Refills   pregabalin (LYRICA) 150 MG capsule 360 capsule 0    Sig: TAKE 2 CAPSULE BY MOUTH TWICE DAILY    Authorizing Provider: Carlota Raspberry, JEFFREY R    Ordering User: Teresita Madura

## 2020-05-03 ENCOUNTER — Other Ambulatory Visit: Payer: Self-pay

## 2020-05-07 ENCOUNTER — Other Ambulatory Visit: Payer: Self-pay

## 2020-05-07 ENCOUNTER — Ambulatory Visit: Payer: PPO | Admitting: Endocrinology

## 2020-05-07 VITALS — BP 140/76 | HR 101 | Ht 70.0 in | Wt 255.8 lb

## 2020-05-07 DIAGNOSIS — Z794 Long term (current) use of insulin: Secondary | ICD-10-CM

## 2020-05-07 DIAGNOSIS — E6609 Other obesity due to excess calories: Secondary | ICD-10-CM

## 2020-05-07 DIAGNOSIS — E1122 Type 2 diabetes mellitus with diabetic chronic kidney disease: Secondary | ICD-10-CM

## 2020-05-07 DIAGNOSIS — E039 Hypothyroidism, unspecified: Secondary | ICD-10-CM

## 2020-05-07 DIAGNOSIS — N189 Chronic kidney disease, unspecified: Secondary | ICD-10-CM | POA: Diagnosis not present

## 2020-05-07 DIAGNOSIS — E1151 Type 2 diabetes mellitus with diabetic peripheral angiopathy without gangrene: Secondary | ICD-10-CM | POA: Diagnosis not present

## 2020-05-07 DIAGNOSIS — Z6836 Body mass index (BMI) 36.0-36.9, adult: Secondary | ICD-10-CM | POA: Diagnosis not present

## 2020-05-07 LAB — POCT GLYCOSYLATED HEMOGLOBIN (HGB A1C): Hemoglobin A1C: 6.3 % — AB (ref 4.0–5.6)

## 2020-05-07 MED ORDER — TRULICITY 3 MG/0.5ML ~~LOC~~ SOAJ: 3.0000 mg | SUBCUTANEOUS | 3 refills | Status: DC

## 2020-05-07 MED ORDER — LANTUS SOLOSTAR 100 UNIT/ML ~~LOC~~ SOPN: 110.0000 [IU] | PEN_INJECTOR | SUBCUTANEOUS | 1 refills | Status: DC

## 2020-05-07 NOTE — Patient Instructions (Addendum)
Please reduce the Lantus to 110 units each morning, and:    continue the same other diabetes medications.  Blood tests are requested for you today.  We'll let you know about the results.  check your blood sugar twice a day.  vary the time of day when you check, between before the 3 meals, and at bedtime.  also check if you have symptoms of your blood sugar being too high or too low.  please keep a record of the readings and bring it to your next appointment here (or you can bring the meter itself).  You can write it on any piece of paper.  please call us sooner if your blood sugar goes below 70, or if you have a lot of readings over 200.   Please come back for a follow-up appointment in 2 months.

## 2020-05-07 NOTE — Progress Notes (Unsigned)
Subjective:    Patient ID: Daniel [name], male    DOB: May 19, 1955, 65 y.o.   MRN: [id number]  HPI Pt returns for f/u of diabetes mellitus:  DM type: Insulin-requiring type 2.  Dx'ed: 5462 Complications: PN, PAD, CRI, and foot ulcers.   Therapy: insulin since 7035, Trulicity, and Invokana.   DKA: never Severe hypoglycemia: never.  Pancreatitis: never.   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   Other: he takes multiple daily injections, but basal insulin is emphasized, with improved results; he is retired; he eats meals at [employer] and 1AM  Interval history: I reviewed continuous glucose monitor data.  Glucose varies from 65-350.  It is in general highest at [place], and lowest at [place].  pt states he feels well in general.  He has mild hypoglycemia approx twice per month.  This usually happens fasting, or when a meal is missed.  Pt says Lantus is 120 units QAM. Confirmed by readback.   He takes synthroid as rx'ed.   Past Medical History:  Diagnosis Date  . ADHD (attention deficit hyperactivity disorder)   . Anxiety   . Chronic kidney disease   . Clotting disorder ([place])   . Depression   . Diabetes mellitus without complication ([place])   . Diabetes mellitus, type II ([place])   . Dizziness 03/17/2015  . Essential hypertension 06/25/2018  . GERD (gastroesophageal reflux disease)   . HIV disease ([place])   . HIV infection ([place])   . Liver disease   . OSA (obstructive sleep apnea) 07/25/2015   Uses CPAP regularly  . Peripheral vascular disease ([place])   . Ulcer     Past Surgical History:  Procedure Laterality Date  . AMPUTATION Right 10/02/2017   Procedure: RIGHT TRANSMETATARSAL AMPUTATION;  Surgeon: Leandrew Koyanagi, MD;  Location: [place];  Service: Orthopedics;  Laterality: Right;  . SMALL INTESTINE SURGERY    . STOMACH SURGERY    . TOE AMPUTATION Right 08/2016   right great toe    Social History   Socioeconomic History  . Marital status: Single    Spouse  name: Not on file  . Number of children: Not on file  . Years of education: Not on file  . Highest education level: Not on file  Occupational History    Comment: DISABILITY  Tobacco Use  . Smoking status: Former Smoker    Packs/day: 0.10    Years: 10.00    Pack years: 1.00    Types: Cigars    Quit date: 08/09/2014    Years since quitting: 5.7  . Smokeless tobacco: Never Used  Vaping Use  . Vaping Use: Never used  Substance and Sexual Activity  . Alcohol use: No    Alcohol/week: 0.0 standard drinks  . Drug use: No  . Sexual activity: Not Currently    Partners: Male    Comment: pt. declined condoms  Other Topics Concern  . Not on file  Social History Narrative   Epworth Sleepiness Scale = 7 (as of 03/16/2015)   Social Determinants of Health   Financial Resource Strain: Not on file  Food Insecurity: Not on file  Transportation Needs: Not on file  Physical Activity: Not on file  Stress: Not on file  Social Connections: Not on file  Intimate Partner Violence: Not on file    Current Outpatient Medications on File Prior to Visit  Medication Sig Dispense Refill  . acetaminophen (TYLENOL) 500 MG tablet Take  1,500 mg by mouth in the morning and at bedtime.    . ALPRAZolam (XANAX) 1 MG tablet Take 1 mg by mouth at bedtime. *May take one tablet up to 4 times daily as needed for anxiety    . amphetamine-dextroamphetamine (ADDERALL) 30 MG tablet Take 30 mg by mouth 3 (three) times daily.    . brexpiprazole (REXULTI) 1 MG TABS tablet Take 1 mg by mouth at bedtime.    . Continuous Blood Gluc Sensor (FREESTYLE LIBRE 14 DAY SENSOR) MISC USE AS DIRECTED EVERY 14 DAYS 6 each 3  . diclofenac Sodium (VOLTAREN) 1 % GEL APPLY 2 GRAMS TOPICALLY TO EACH KNEE IN THE MORNING AND AT BEDTIME APPLY 1 GRAM TO EACH KNEE IN THE AFTERNOON 300 g 0  . divalproex (DEPAKOTE ER) 500 MG 24 hr tablet TAKE 1 TABLET(500 MG) BY MOUTH AT BEDTIME 90 tablet 0  . ezetimibe (ZETIA) 10 MG tablet TAKE 1 TABLET(10 MG) BY  MOUTH DAILY 90 tablet 2  . furosemide (LASIX) 40 MG tablet TAKE 1 TABLET(40 MG) BY MOUTH DAILY 90 tablet 1  . Insulin Aspart FlexPen 100 UNIT/ML SOPN INJECT 15 UNITS UNDER THE SKIN WITH BREAKFAST AND 25 UNITS WITH SUPPER 36 mL 0  . Insulin Pen Needle (B-D ULTRAFINE III SHORT PEN) 31G X 8 MM MISC 1 each by Other route 3 (three) times daily. E11.9 100 each 2  . INVOKANA 100 MG TABS tablet TAKE 1 TABLET(100 MG) BY MOUTH DAILY BEFORE BREAKFAST 30 tablet 11  . levothyroxine (SYNTHROID, LEVOTHROID) 50 MCG tablet Take 1 tablet (50 mcg total) by mouth at bedtime. 30 tablet 11  . meclizine (ANTIVERT) 12.5 MG tablet Take 1 tablet (12.5 mg total) by mouth 2 (two) times daily as needed for dizziness. 30 tablet 0  . mupirocin ointment (BACTROBAN) 2 % Apply 1 application topically 2 (two) times daily. 22 g 0  . ondansetron (ZOFRAN) 8 MG tablet TAKE 1 TABLET(8 MG) BY MOUTH EVERY 8 HOURS AS NEEDED FOR NAUSEA OR VOMITING 20 tablet 1  . pantoprazole (PROTONIX) 40 MG tablet TAKE 1 TABLET(40 MG) BY MOUTH DAILY AT 6 AM 90 tablet 0  . polyethylene glycol (MIRALAX / GLYCOLAX) packet Take 17 g by mouth 2 (two) times daily. (Patient taking differently: Take 17 g by mouth 2 (two) times daily as needed for mild constipation or moderate constipation.) 30 each 0  . pregabalin (LYRICA) 150 MG capsule TAKE 2 CAPSULE BY MOUTH TWICE DAILY 360 capsule 0  . protriptyline (VIVACTIL) 10 MG tablet Take 10 mg by mouth 3 (three) times daily.   11  . tobramycin-dexamethasone (TOBRADEX) ophthalmic solution Instill one drop OS QID x 7 days    . TRINTELLIX 20 MG TABS tablet Take 20 mg by mouth at bedtime.     . TRIUMEQ 600-50-300 MG tablet TAKE 1 TABLET BY MOUTH DAILY 30 tablet 5  . XARELTO 20 MG TABS tablet TAKE 1 TABLET(20 MG) BY MOUTH DAILY WITH SUPPER 90 tablet 0  . zolpidem (AMBIEN) 10 MG tablet Take 10 mg by mouth at bedtime.     No current facility-administered medications on file prior to visit.    Allergies  Allergen Reactions   . Aspirin Swelling  . Ibuprofen Swelling  . Sustiva [Efavirenz] Swelling and Rash  . Nsaids Other (See Comments)    unknwn  . Lipitor [Atorvastatin Calcium] Other (See Comments)    Leg pain    Family History  Problem Relation Age of Onset  . Depression Brother   .  Throat cancer Brother        half brother, never smoker  . COPD Mother   . Diabetes Neg Hx     BP 140/76 (BP Location: Right Arm, Patient Position: Sitting, Cuff Size: Large)   Pulse (!) 101   Ht 5' 10"  (1.778 m)   Wt 255 lb 12.8 oz (116 kg)   SpO2 95%   BMI 36.70 kg/m   Review of Systems He denies LOC    Objective:   Physical Exam VITAL SIGNS:  See vs page GENERAL: no distress Pulses: left foot pulses are intact.    MSK: no deformity of the feet or ankles, except for right TMA.     CV: 2+ bilat edema of the legs.   Skin: normal color and temp on the feet and ankles.  No erythema.  No ulcer, but there are bilat heavy calluses.  Neuro: sensation is intact to touch on the feet and ankles, but severely decreased from normal.   There is onychomycosis of the left foot toenails.   Lab Results  Component Value Date   HGBA1C 6.3 (A) 05/07/2020   Lab Results  Component Value Date   CREATININE 1.56 (H) 01/04/2020   BUN 23 01/04/2020   NA 139 01/04/2020   K 4.4 01/04/2020   CL 101 01/04/2020   CO2 26 01/04/2020    Lab Results  Component Value Date   TSH 0.47 05/07/2020       Assessment & Plan:  Insulin-requiring type 2 DM, with PAD: overcontrolled Hypothyroidism: well-replaced.  Please continue the same synthroid.  CRI: check fructosamine.    Patient Instructions  Please reduce the Lantus to 110 units each morning, and:    continue the same other diabetes medications.  Blood tests are requested for you today.  We'll let you know about the results.  check your blood sugar twice a day.  vary the time of day when you check, between before the 3 meals, and at bedtime.  also check if you have symptoms  of your blood sugar being too high or too low.  please keep a record of the readings and bring it to your next appointment here (or you can bring the meter itself).  You can write it on any piece of paper.  please call us sooner if your blood sugar goes below 70, or if you have a lot of readings over 200.   Please come back for a follow-up appointment in 2 months.

## 2020-05-08 ENCOUNTER — Ambulatory Visit: Payer: PPO | Admitting: Physician Assistant

## 2020-05-08 ENCOUNTER — Encounter: Payer: Self-pay | Admitting: Internal Medicine

## 2020-05-08 LAB — BASIC METABOLIC PANEL
BUN: 20 mg/dL (ref 6–23)
CO2: 28 mEq/L (ref 19–32)
Calcium: 9.6 mg/dL (ref 8.4–10.5)
Chloride: 103 mEq/L (ref 96–112)
Creatinine, Ser: 1.54 mg/dL — ABNORMAL HIGH (ref 0.40–1.50)
GFR: 46.66 mL/min — ABNORMAL LOW (ref >60.00)
Glucose, Bld: 68 mg/dL — ABNORMAL LOW (ref 70–99)
Potassium: 4.4 mEq/L (ref 3.5–5.1)
Sodium: 139 mEq/L (ref 135–145)

## 2020-05-08 LAB — T4, FREE: Free T4: 0.78 ng/dL (ref 0.60–1.60)

## 2020-05-08 LAB — TSH: TSH: 0.47 u[IU]/mL (ref 0.35–4.50)

## 2020-05-09 LAB — FRUCTOSAMINE: Fructosamine: 252 umol/L (ref 205–285)

## 2020-05-14 ENCOUNTER — Other Ambulatory Visit: Payer: Self-pay | Admitting: Family Medicine

## 2020-05-14 DIAGNOSIS — M25561 Pain in right knee: Secondary | ICD-10-CM

## 2020-05-14 DIAGNOSIS — G8929 Other chronic pain: Secondary | ICD-10-CM

## 2020-05-23 ENCOUNTER — Ambulatory Visit: Payer: Self-pay | Admitting: Family Medicine

## 2020-06-01 ENCOUNTER — Other Ambulatory Visit: Payer: Self-pay | Admitting: Endocrinology

## 2020-06-01 ENCOUNTER — Other Ambulatory Visit: Payer: Self-pay | Admitting: Family Medicine

## 2020-06-01 DIAGNOSIS — G8929 Other chronic pain: Secondary | ICD-10-CM

## 2020-06-01 DIAGNOSIS — M25561 Pain in right knee: Secondary | ICD-10-CM

## 2020-06-01 DIAGNOSIS — I824Y9 Acute embolism and thrombosis of unspecified deep veins of unspecified proximal lower extremity: Secondary | ICD-10-CM

## 2020-06-14 ENCOUNTER — Other Ambulatory Visit: Payer: Self-pay | Admitting: Endocrinology

## 2020-06-14 DIAGNOSIS — Z794 Long term (current) use of insulin: Secondary | ICD-10-CM

## 2020-06-14 DIAGNOSIS — E0849 Diabetes mellitus due to underlying condition with other diabetic neurological complication: Secondary | ICD-10-CM

## 2020-06-18 ENCOUNTER — Telehealth: Payer: Self-pay | Admitting: Cardiovascular Disease

## 2020-06-18 NOTE — Telephone Encounter (Signed)
Called pt LVM for patient to call to update address as he is retirned ail status . Also , to let our office know if he wishes to stay with his Provider [name] or shift care to  another Provider at the [facility]

## 2020-06-21 ENCOUNTER — Ambulatory Visit: Payer: PPO | Admitting: Family Medicine

## 2020-06-21 DIAGNOSIS — Z0289 Encounter for other administrative examinations: Secondary | ICD-10-CM

## 2020-07-23 NOTE — Progress Notes (Deleted)
Cardiology Clinic Note   Patient Name: Daniel [name] Date of Encounter: 07/23/2020  Primary Care Provider:  [name], MD Primary Cardiologist:  Skeet Latch, MD  Patient Profile    Robina Ade Karow presents the clinic today for follow-up evaluation of his shortness of breath, lower extremity edema, and hypertension.  Past Medical History    Past Medical History:  Diagnosis Date  . ADHD (attention deficit hyperactivity disorder)   . Anxiety   . Chronic kidney disease   . Clotting disorder ([place])   . Depression   . Diabetes mellitus without complication ([place])   . Diabetes mellitus, type II ([place])   . Dizziness 03/17/2015  . Essential hypertension 06/25/2018  . GERD (gastroesophageal reflux disease)   . HIV disease ([place])   . HIV infection ([place])   . Liver disease   . OSA (obstructive sleep apnea) 07/25/2015   Uses CPAP regularly  . Peripheral vascular disease ([place])   . Ulcer    Past Surgical History:  Procedure Laterality Date  . AMPUTATION Right 10/02/2017   Procedure: RIGHT TRANSMETATARSAL AMPUTATION;  Surgeon: Leandrew Koyanagi, MD;  Location: [place];  Service: Orthopedics;  Laterality: Right;  . SMALL INTESTINE SURGERY    . STOMACH SURGERY    . TOE AMPUTATION Right 08/2016   right great toe    Allergies  Allergies  Allergen Reactions  . Aspirin Swelling  . Ibuprofen Swelling  . Sustiva [Efavirenz] Swelling and Rash  . Nsaids Other (See Comments)    unknwn  . Lipitor [Atorvastatin Calcium] Other (See Comments)    Leg pain    History of Present Illness    Mr. 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  He was first seen and evaluated by cardiology 1/17 for dizziness and falls.  He reported ongoing symptoms for several months.  He reported that the room felt as though it was spinning with changing positions, leaning his head back or laying down produce symptoms.  His echocardiogram 2/17 showed an EF of  60-65% and mild LVH.  Carotid Dopplers 1/17 showed minimal plaque bilaterally.  He would ported exertional dyspnea.  He was referred for [facility] which was negative for ischemia.  He also had nerve conduction studies that revealed peripheral neuropathy consistent with diabetic neuropathy.  He was referred for 30-day event monitor which was unremarkable.  He has had his toes amputated on his right foot.  His ABIs were normal 8/17 prior to his great toe amputation.  He underwent Lexiscan 2/20 which showed an LVEF of 59% no ischemia.  His lipid panel showed an LDL of 113.  This was down from 154.  He had persistent transaminitis with AST of 52 and ALT of 64.  This persisted with discontinuation of his statin medication.  His blood pressure was poorly controlled so amlodipine was added to his medication regimen.  He complained of pain in his knees which made it difficult for him to walk.  He was also unsteady with his gait after his amputations.  He reports several falls.  He trips easily.  He was last seen by Dr. [name] on 01/04/2020.  During that time he continued to struggle with pain in his knees.  He was contemplating knee replacement.  He was using Voltaren gel which helped with his symptoms.  He denied chest pain on exertion.  He was limited in his exercise due to his knee pain.  He was able  to go up 1 flight of stairs without shortness of breath.  He reported shortness of breath with going on more than 1 flight of stairs.  He continued to report that his knees were unsteady and giving out with ambulation.  He denied orthopnea and PND.  He was not checking his blood pressure at home.  He reported he was not eating much but thought he was doing wrong things.  He was struggling with his eating.  He reported that he was eating mostly frozen dinners and enjoyed Asian food.  He would get short of breath with walking short distances.  He reported some pain in his right flank felt pressure in his kidneys.  He  denied dysuria or hematuria but did note that his urine was very dark.  His furosemide was increased to 40 mg twice daily for 3 days.  Instructions were given to reassess prior to his surgical cardiac evaluation.  He presents the clinic today for follow-up evaluation states***  *** denies chest pain, shortness of breath, lower extremity edema, fatigue, palpitations, melena, hematuria, hemoptysis, diaphoresis, weakness, presyncope, syncope, orthopnea, and PND.   Home Medications    Prior to Admission medications   Medication Sig Start Date End Date Taking? Authorizing Provider  acetaminophen (TYLENOL) 500 MG tablet Take 1,500 mg by mouth in the morning and at bedtime.    [provider]  ALPRAZolam Duanne Moron) 1 MG tablet Take 1 mg by mouth at bedtime. *May take one tablet up to 4 times daily as needed for anxiety    [provider]  amphetamine-dextroamphetamine (ADDERALL) 30 MG tablet Take 30 mg by mouth 3 (three) times daily.    [provider]  B-D ULTRAFINE III SHORT PEN 31G X 8 MM MISC USE AS DIRECTED THREE TIMES DAILY 06/14/20   Renato Shin, MD  brexpiprazole (REXULTI) 1 MG TABS tablet Take 1 mg by mouth at bedtime.    [provider]  Continuous Blood Gluc Sensor (FREESTYLE LIBRE 14 DAY SENSOR) MISC USE AS DIRECTED EVERY 14 DAYS 11/18/19   Renato Shin, MD  diclofenac Sodium (VOLTAREN) 1 % GEL APPLY 2 GRAMS TOPICALLY TO EACH KNEE IN THE MORNING AND AT BEDTIME- APPLY 1 GRAM TO EACH KNEE IN THE AFTERNOON 06/19/20   [name], MD  divalproex (DEPAKOTE ER) 500 MG 24 hr tablet TAKE 1 TABLET(500 MG) BY MOUTH AT BEDTIME 02/23/20   [name], MD  Dulaglutide (TRULICITY) 3 HD/6.2IW SOPN Inject 3 mg into the skin once a week. 05/07/20   Renato Shin, MD  ezetimibe (ZETIA) 10 MG tablet TAKE 1 TABLET(10 MG) BY MOUTH DAILY 03/13/20   Skeet Latch, MD  furosemide (LASIX) 40 MG tablet TAKE 1 TABLET(40 MG) BY MOUTH DAILY 09/26/19   Skeet Latch, MD   Insulin Aspart FlexPen 100 UNIT/ML SOPN INJECT 15 UNITS UNDER THE SKIN WITH BREAKFAST AND 25 UNITS WITH SUPPER 06/01/20   Renato Shin, MD  insulin glargine (LANTUS SOLOSTAR) 100 UNIT/ML Solostar Pen Inject 110 Units into the skin every morning. 05/07/20   Renato Shin, MD  INVOKANA 100 MG TABS tablet TAKE 1 TABLET(100 MG) BY MOUTH DAILY BEFORE BREAKFAST 01/11/20   Renato Shin, MD  levothyroxine (SYNTHROID, LEVOTHROID) 50 MCG tablet Take 1 tablet (50 mcg total) by mouth at bedtime. 07/24/15   [name], MD  meclizine (ANTIVERT) 12.5 MG tablet Take 1 tablet (12.5 mg total) by mouth 2 (two) times daily as needed for dizziness. 11/18/19   Maximiano Coss, NP  mupirocin  ointment (BACTROBAN) 2 % Apply 1 application topically 2 (two) times daily. 02/21/20   Aundra Dubin, PA-C  ondansetron (ZOFRAN) 8 MG tablet TAKE 1 TABLET(8 MG) BY MOUTH EVERY 8 HOURS AS NEEDED FOR NAUSEA OR VOMITING 10/21/19   Comer, Okey Regal, MD  pantoprazole (PROTONIX) 40 MG tablet TAKE 1 TABLET(40 MG) BY MOUTH DAILY AT 6 AM 02/09/20   [name], MD  polyethylene glycol [facility] Floria Raveling) packet Take 17 g by mouth 2 (two) times daily. Patient taking differently: Take 17 g by mouth 2 (two) times daily as needed for mild constipation or moderate constipation. 10/05/17   Eugenie Filler, MD  pregabalin (LYRICA) 150 MG capsule TAKE 2 CAPSULE BY MOUTH TWICE DAILY 04/12/20   [name], MD  protriptyline (VIVACTIL) 10 MG tablet Take 10 mg by mouth 3 (three) times daily.  01/30/16   [provider]  tobramycin-dexamethasone [facility]) ophthalmic solution Instill one drop OS QID x 7 days 10/18/19   [provider]  TRINTELLIX 20 MG TABS tablet Take 20 mg by mouth at bedtime.  01/26/15   [provider]  TRIUMEQ 600-50-300 MG tablet TAKE 1 TABLET BY MOUTH DAILY 04/11/20   Comer, Okey Regal, MD  XARELTO 20 MG TABS tablet TAKE 1 TABLET(20 MG) BY MOUTH DAILY WITH SUPPER 06/19/20   [name], MD   zolpidem (AMBIEN) 10 MG tablet Take 10 mg by mouth at bedtime.    [provider]    Family History    Family History  Problem Relation Age of Onset  . Depression Brother   . Throat cancer Brother        half brother, never smoker  . COPD Mother   . Diabetes Neg Hx    He indicated that his mother is deceased. He indicated that the status of his father is unknown. He indicated that his sister is alive. He indicated that his brother is alive. He indicated that his maternal grandmother is alive. He indicated that his maternal grandfather is deceased. He indicated that his paternal grandmother is deceased. He indicated that his paternal grandfather is deceased. He indicated that the status of his neg hx is unknown.  Social History    Social History   Socioeconomic History  . Marital status: Single    Spouse name: Not on file  . Number of children: Not on file  . Years of education: Not on file  . Highest education level: Not on file  Occupational History    Comment: DISABILITY  Tobacco Use  . Smoking status: Former Smoker    Packs/day: 0.10    Years: 10.00    Pack years: 1.00    Types: Cigars    Quit date: 08/09/2014    Years since quitting: 5.9  . Smokeless tobacco: Never Used  Vaping Use  . Vaping Use: Never used  Substance and Sexual Activity  . Alcohol use: No    Alcohol/week: 0.0 standard drinks  . Drug use: No  . Sexual activity: Not Currently    Partners: Male    Comment: pt. declined condoms  Other Topics Concern  . Not on file  Social History Narrative   Epworth Sleepiness Scale = 7 (as of 03/16/2015)   Social Determinants of Health   Financial Resource Strain: Not on file  Food Insecurity: Not on file  Transportation Needs: Not on file  Physical Activity: Not on file  Stress: Not on file  Social Connections: Not on file  Intimate Partner Violence: Not on file     Review of Systems    General:  No chills, fever, night sweats or weight  changes.  Cardiovascular:  No chest pain, dyspnea on exertion, edema, orthopnea, palpitations, paroxysmal nocturnal dyspnea. Dermatological: No rash, lesions/masses Respiratory: No cough, dyspnea Urologic: No hematuria, dysuria Abdominal:   No nausea, vomiting, diarrhea, bright red blood per rectum, melena, or hematemesis Neurologic:  No visual changes, wkns, changes in mental status. All other systems reviewed and are otherwise negative except as noted above.  Physical Exam    VS:  There were no vitals taken for this visit. , BMI There is no height or weight on file to calculate BMI. GEN: Well nourished, well developed, in no acute distress. HEENT: normal. Neck: Supple, no JVD, carotid bruits, or masses. Cardiac: RRR, no murmurs, rubs, or gallops. No clubbing, cyanosis, edema.  Radials/DP/PT 2+ and equal bilaterally.  Respiratory:  Respirations regular and unlabored, clear to auscultation bilaterally. GI: Soft, nontender, nondistended, BS + x 4. MS: no deformity or atrophy. Skin: warm and dry, no rash. Neuro:  Strength and sensation are intact. Psych: Normal affect.  Accessory Clinical Findings    Recent Labs: 09/15/2019: ALT 24; Hemoglobin 15.9; Magnesium 2.3; Platelets 125 01/04/2020: BNP 8.7 05/07/2020: BUN 20; Creatinine, Ser 1.54; Potassium 4.4; Sodium 139; TSH 0.47   Recent Lipid Panel    Component Value Date/Time   CHOL 239 (H) 05/19/2019 1719   TRIG 241 (H) 05/19/2019 1719   HDL 40 05/19/2019 1719   CHOLHDL 6.0 (H) 05/19/2019 1719   CHOLHDL 3.3 08/09/2015 0956   VLDL 22 08/09/2015 0956   LDLCALC 155 (H) 05/19/2019 1719    ECG personally reviewed by me today- *** - No acute changes  Echocardiogram 01/31/2020 IMPRESSIONS    1. Left ventricular ejection fraction, by estimation, is 60 to 65%. The  left ventricle has normal function. The left ventricle has no regional  wall motion abnormalities. There is mild left ventricular hypertrophy.  Left ventricular  diastolic parameters  are consistent with Grade I diastolic dysfunction (impaired relaxation).  2. Right ventricular systolic function is normal. The right ventricular  size is normal.  3. The mitral valve is grossly normal. No evidence of mitral valve  regurgitation.  4. The aortic valve was not well visualized. Aortic valve regurgitation  is not visualized.  5. Aortic dilatation noted. There is borderline dilatation of the aortic  root, measuring 39 mm.  6. The inferior vena cava is normal in size with greater than 50%  respiratory variability, suggesting right atrial pressure of 3 mmHg.   Comparison(s): A prior study was performed on 04/05/2015. No significant  change from prior study.   Nuclear stress test 04/02/2018  Normal perfusion NO ischemia or scar  Nuclear stress EF: 59%.  There was no ST segment deviation noted during stress.  The study is normal.  This is a low risk study.    Assessment & Plan   1.  Hyperlipidemia-LDL 155 on 05/19/2019.  Statin intolerant.  Statin stopped due to transaminitis. Continue ezetimibe Heart healthy low-sodium high-fiber diet Increase physical activity as tolerated Repeat fasting lipids  Refer to lipid clinic  Atypical chest pain- no recent episodes of chest discomfort, arm or back discomfort.  Underwent Lexiscan 2/20 negative for ischemia No plan for further ischemic evaluation  Bilateral lower extremity edema/DOE- no increased DOE or activity intolerance.  Echocardiogram 12/21 showed normal LVEF Heart healthy low-sodium diet-salty 6 given Increase physical activity as tolerated Lower  extremity support stockings-[place] support stocking sheet given  DVT-history of recurrent unprovoked DVT.  On lifelong anticoagulation.  Denies bleeding issues.  Previously granted permission for 3-day hold preoperatively. Continue Xarelto Heart healthy low-sodium diet-salty 6 given Increase physical activity as tolerated  Disposition:  Follow-up with Dr. [name] in 6 months.  Jossie Ng. Deysy Schabel NP-C    07/23/2020, 3:34 PM [place] Office [phone] Fax [phone]  Notice: This dictation was prepared with Dragon dictation along with smaller phrase technology. Any transcriptional errors that result from this process are unintentional and may not be corrected upon review.  I spent***minutes examining this patient, reviewing medications, and using patient centered shared decision making involving her cardiac care.  Prior to her visit I spent greater than 20 minutes reviewing her past medical history,  medications, and prior cardiac tests.

## 2020-07-24 ENCOUNTER — Ambulatory Visit: Payer: PPO | Admitting: General Practice

## 2020-08-06 ENCOUNTER — Ambulatory Visit: Payer: PPO | Admitting: Family Medicine

## 2020-08-06 DIAGNOSIS — Z0289 Encounter for other administrative examinations: Secondary | ICD-10-CM

## 2020-08-13 ENCOUNTER — Ambulatory Visit: Payer: PPO | Admitting: Endocrinology

## 2020-08-15 ENCOUNTER — Other Ambulatory Visit: Payer: Self-pay | Admitting: Endocrinology

## 2020-08-15 ENCOUNTER — Other Ambulatory Visit: Payer: Self-pay | Admitting: Family Medicine

## 2020-08-15 DIAGNOSIS — M25562 Pain in left knee: Secondary | ICD-10-CM

## 2020-08-15 DIAGNOSIS — Z8669 Personal history of other diseases of the nervous system and sense organs: Secondary | ICD-10-CM

## 2020-08-15 DIAGNOSIS — G8929 Other chronic pain: Secondary | ICD-10-CM

## 2020-08-15 NOTE — Telephone Encounter (Signed)
LFD for diclofenac 06/19/20 #300g with no efills LFD for depakote 02/13/20 #90 with no refills LOV 02/23/20 NOV 09/17/20

## 2020-08-15 NOTE — Telephone Encounter (Signed)
Refills given, but must keep July appointment.  He has no-showed last 2 appointments with me.  He will need to have office visit for continued prescription refills.

## 2020-08-16 NOTE — Telephone Encounter (Signed)
Unable to reach pt due to it being busy//ELEA

## 2020-08-22 ENCOUNTER — Telehealth: Payer: Self-pay | Admitting: Endocrinology

## 2020-08-22 NOTE — Telephone Encounter (Signed)
MEDICATION: Lantus  PHARMACY:   [facility] [place], [place] Phone:  [phone]  Fax:  [phone]      HAS THE PATIENT CONTACTED THEIR PHARMACY?  yes  IS THIS A 90 DAY SUPPLY : no  IS PATIENT OUT OF MEDICATION: yes  IF NOT; HOW MUCH IS LEFT:   LAST APPOINTMENT DATE: @6 /22/2022  NEXT APPOINTMENT DATE:@7 /12/2020  DO WE HAVE YOUR PERMISSION TO LEAVE A DETAILED MESSAGE?: yes    **Let patient know to contact pharmacy at the end of the day to make sure medication is ready. **  ** Please notify patient to allow 48-72 hours to process**  **Encourage patient to contact the pharmacy for refills or they can request refills through [facility]**

## 2020-08-28 ENCOUNTER — Other Ambulatory Visit: Payer: Self-pay

## 2020-08-28 DIAGNOSIS — Z6836 Body mass index (BMI) 36.0-36.9, adult: Secondary | ICD-10-CM

## 2020-08-28 DIAGNOSIS — E6609 Other obesity due to excess calories: Secondary | ICD-10-CM

## 2020-08-28 DIAGNOSIS — E1122 Type 2 diabetes mellitus with diabetic chronic kidney disease: Secondary | ICD-10-CM

## 2020-08-28 DIAGNOSIS — N1831 Chronic kidney disease, stage 3a: Secondary | ICD-10-CM

## 2020-08-28 MED ORDER — LANTUS SOLOSTAR 100 UNIT/ML ~~LOC~~ SOPN: 110.0000 [IU] | PEN_INJECTOR | SUBCUTANEOUS | 1 refills | Status: DC

## 2020-08-29 ENCOUNTER — Ambulatory Visit: Payer: PPO | Admitting: Adult Health

## 2020-08-29 ENCOUNTER — Other Ambulatory Visit: Payer: Self-pay

## 2020-08-29 VITALS — BP 159/93 | HR 93 | Ht 70.0 in | Wt 250.2 lb

## 2020-08-29 DIAGNOSIS — Z9989 Dependence on other enabling machines and devices: Secondary | ICD-10-CM

## 2020-08-29 DIAGNOSIS — G4733 Obstructive sleep apnea (adult) (pediatric): Secondary | ICD-10-CM

## 2020-08-29 NOTE — Patient Instructions (Signed)
Your Plan:  Continue CPAP- pressure increased to 8-16 Home sleep test ordered Will order new machine after test     Thank you for coming to see [name] at [facility]. I hope we have been able to provide you high quality care today.  You may receive a patient satisfaction survey over the next few weeks. We would appreciate your feedback and comments so that we may continue to improve ourselves and the health of our patients.

## 2020-08-29 NOTE — Progress Notes (Signed)
PATIENT: Daniel [name] DOB: Mar 08, 1955  REASON FOR VISIT: follow up HISTORY FROM: patient PRIMARY NEUROLOGIST: Dr. Brett Fairy  HISTORY OF PRESENT ILLNESS: Today 08/29/20:  Daniel [name] is a 65 year old male with a history of obstructive sleep apnea on CPAP.  He returns today for follow-up.  He states that he has not been using his CPAP for the last month.  He reports that he is in the process of moving and just has not been using it.  He does state that when he was using it he felt that he was suffocating.  He feels that the initial pressure needs to be higher.  Below is his download from a month ago.    HISTORY (Copied from Dr.Dohmeier's note) 08-24-2019,  I have the pleasure of meeting today again with Daniel [name], a 65 year old caucasian gentleman, who has been referred for migraine treatment to [place] originally and was followed  By [name] and in 2017 became a sleep apnea patient of mine.   Primary care is still Dr. Cindee Lame.  The patient has been using CPAP compliantly 97% with an average use at time of 9 hours and 20 minutes, his minimum pressure setting of 6 maximum pressure 16 and 2 cm expiratory pressure relief are provided his residual AHI is 4.8/h.  the majority is still obstructive when looking at the breakdown.  There are only 0.1 central apnea and 0 point point unknown apnea per hour the air leak is moderately to high at 95th percentile 20.7 L/min and the 95th percentile pressure is 15.6 cm water , so it exceeds what is currently the limit on his autotitrator.   REVIEW OF SYSTEMS: Out of a complete 14 system review of symptoms, the patient complains only of the following symptoms, and all other reviewed systems are negative.  ESS 8  ALLERGIES: Allergies  Allergen Reactions   Aspirin Swelling   Ibuprofen Swelling   Sustiva [Efavirenz] Swelling and Rash   Nsaids Other (See Comments)    unknwn   Lipitor [Atorvastatin Calcium] Other (See Comments)    Leg pain     HOME MEDICATIONS: Outpatient Medications Prior to Visit  Medication Sig Dispense Refill   acetaminophen (TYLENOL) 500 MG tablet Take 1,500 mg by mouth in the morning and at bedtime.     ALPRAZolam (XANAX) 1 MG tablet Take 1 mg by mouth at bedtime. *May take one tablet up to 4 times daily as needed for anxiety     amphetamine-dextroamphetamine (ADDERALL) 30 MG tablet Take 30 mg by mouth 3 (three) times daily.     B-D ULTRAFINE III SHORT PEN 31G X 8 MM MISC USE AS DIRECTED THREE TIMES DAILY 100 each 2   brexpiprazole (REXULTI) 1 MG TABS tablet Take 1 mg by mouth at bedtime.     Continuous Blood Gluc Sensor (FREESTYLE LIBRE 14 DAY SENSOR) MISC USE AS DIRECTED EVERY 14 DAYS 6 each 3   diclofenac Sodium (VOLTAREN) 1 % GEL APPLY 2 GRAMS TOPICALLY TO EACH KNEE IN THE MORNING AND AT BEDTIME. APPLY 1 GRAM TO EACH KNEE IN THE AFTERNOON 300 g 0   divalproex (DEPAKOTE ER) 500 MG 24 hr tablet TAKE 1 TABLET(500 MG) BY MOUTH AT BEDTIME 90 tablet 0   Dulaglutide (TRULICITY) 3 ZE/0.9QZ SOPN Inject 3 mg into the skin once a week. 6 mL 3   ezetimibe (ZETIA) 10 MG tablet TAKE 1 TABLET(10 MG) BY MOUTH DAILY 90 tablet 2   furosemide (LASIX) 40 MG tablet  TAKE 1 TABLET(40 MG) BY MOUTH DAILY 90 tablet 1   Insulin Aspart FlexPen 100 UNIT/ML SOPN INJECT 15 UNITS UNDER THE SKIN WITH BREAKFAST AND 25 UNITS WITH SUPPER 36 mL 0   insulin glargine (LANTUS SOLOSTAR) 100 UNIT/ML Solostar Pen Inject 110 Units into the skin every morning. 75 mL 1   INVOKANA 100 MG TABS tablet TAKE 1 TABLET(100 MG) BY MOUTH DAILY BEFORE BREAKFAST 30 tablet 11   levothyroxine (SYNTHROID, LEVOTHROID) 50 MCG tablet Take 1 tablet (50 mcg total) by mouth at bedtime. 30 tablet 11   meclizine (ANTIVERT) 12.5 MG tablet Take 1 tablet (12.5 mg total) by mouth 2 (two) times daily as needed for dizziness. 30 tablet 0   mupirocin ointment (BACTROBAN) 2 % Apply 1 application topically 2 (two) times daily. 22 g 0   ondansetron (ZOFRAN) 8 MG tablet TAKE 1  TABLET(8 MG) BY MOUTH EVERY 8 HOURS AS NEEDED FOR NAUSEA OR VOMITING 20 tablet 1   pantoprazole (PROTONIX) 40 MG tablet TAKE 1 TABLET(40 MG) BY MOUTH DAILY AT 6 AM 90 tablet 0   polyethylene glycol (MIRALAX / GLYCOLAX) packet Take 17 g by mouth 2 (two) times daily. (Patient taking differently: Take 17 g by mouth 2 (two) times daily as needed for mild constipation or moderate constipation.) 30 each 0   pregabalin (LYRICA) 150 MG capsule TAKE 2 CAPSULE BY MOUTH TWICE DAILY 360 capsule 0   protriptyline (VIVACTIL) 10 MG tablet Take 10 mg by mouth 3 (three) times daily.   11   tobramycin-dexamethasone (TOBRADEX) ophthalmic solution Instill one drop OS QID x 7 days     TRINTELLIX 20 MG TABS tablet Take 20 mg by mouth at bedtime.      TRIUMEQ 600-50-300 MG tablet TAKE 1 TABLET BY MOUTH DAILY 30 tablet 5   XARELTO 20 MG TABS tablet TAKE 1 TABLET(20 MG) BY MOUTH DAILY WITH SUPPER 90 tablet 0   zolpidem (AMBIEN) 10 MG tablet Take 10 mg by mouth at bedtime.     No facility-administered medications prior to visit.    PAST MEDICAL HISTORY: Past Medical History:  Diagnosis Date   ADHD (attention deficit hyperactivity disorder)    Anxiety    Chronic kidney disease    Clotting disorder ([place])    Depression    Diabetes mellitus without complication ([place])    Diabetes mellitus, type II ([place])    Dizziness 03/17/2015   Essential hypertension 06/25/2018   GERD (gastroesophageal reflux disease)    HIV disease ([name])    HIV infection ([name])    Liver disease    OSA (obstructive sleep apnea) 07/25/2015   Uses CPAP regularly   Peripheral vascular disease ([place])    Ulcer     PAST SURGICAL HISTORY: Past Surgical History:  Procedure Laterality Date   AMPUTATION Right 10/02/2017   Procedure: RIGHT TRANSMETATARSAL AMPUTATION;  Surgeon: Leandrew Koyanagi, MD;  Location: [place];  Service: Orthopedics;  Laterality: Right;   SMALL INTESTINE SURGERY     STOMACH SURGERY     TOE AMPUTATION Right 08/2016   right great toe     FAMILY HISTORY: Family History  Problem Relation Age of Onset   Depression Brother    Throat cancer Brother        half brother, never smoker   COPD Mother    Diabetes Neg Hx     SOCIAL HISTORY: Social History   Socioeconomic History   Marital status: Single    Spouse name: Not on file  Number of children: Not on file   Years of education: Not on file   Highest education level: Not on file  Occupational History    Comment: DISABILITY  Tobacco Use   Smoking status: Former    Packs/day: 0.10    Years: 10.00    Pack years: 1.00    Types: Cigars, Cigarettes    Quit date: 08/09/2014    Years since quitting: 6.0   Smokeless tobacco: Never  Vaping Use   Vaping Use: Never used  Substance and Sexual Activity   Alcohol use: No    Alcohol/week: 0.0 standard drinks   Drug use: No   Sexual activity: Not Currently    Partners: Male    Comment: pt. declined condoms  Other Topics Concern   Not on file  Social History Narrative   Epworth Sleepiness Scale = 7 (as of 03/16/2015)   Social Determinants of Health   Financial Resource Strain: Not on file  Food Insecurity: Not on file  Transportation Needs: Not on file  Physical Activity: Not on file  Stress: Not on file  Social Connections: Not on file  Intimate Partner Violence: Not on file      PHYSICAL EXAM  Vitals:   08/29/20 1502 08/29/20 1513  BP: (!) 155/89 (!) 159/93  Pulse: 94 93  Weight: 250 lb 3.2 oz (113.5 kg)   Height: 5' 10"  (1.778 m)    Body mass index is 35.9 kg/m.  Generalized: Well developed, in no acute distress  Chest: Lungs clear to auscultation bilaterally  Neurological examination  Mentation: Alert oriented to time, place, history taking. Follows all commands speech and language fluent Cranial nerve II-XII: Extraocular movements were full, visual field were full on confrontational test Head turning and shoulder shrug  were normal and symmetric. Motor: The motor testing reveals 5 over 5  strength of all 4 extremities. Good symmetric motor tone is noted throughout.  Sensory: Sensory testing is intact to soft touch on all 4 extremities. No evidence of extinction is noted.  Gait and station: Gait is normal.    DIAGNOSTIC DATA (LABS, IMAGING, TESTING) - I reviewed patient records, labs, notes, testing and imaging myself where available.  Lab Results  Component Value Date   WBC 6.0 09/15/2019   HGB 15.9 09/15/2019   HCT 46.8 09/15/2019   MCV 95.3 09/15/2019   PLT 125 (L) 09/15/2019      Component Value Date/Time   NA 139 05/07/2020 1637   NA 139 01/04/2020 1546   K 4.4 05/07/2020 1637   CL 103 05/07/2020 1637   CO2 28 05/07/2020 1637   GLUCOSE 68 (L) 05/07/2020 1637   BUN 20 05/07/2020 1637   BUN 23 01/04/2020 1546   CREATININE 1.54 (H) 05/07/2020 1637   CREATININE 1.69 (H) 09/13/2019 1714   CALCIUM 9.6 05/07/2020 1637   PROT 6.7 09/15/2019 0415   PROT 7.8 05/19/2019 1719   ALBUMIN 3.5 09/15/2019 0415   ALBUMIN 4.8 05/19/2019 1719   AST 21 09/15/2019 0415   ALT 24 09/15/2019 0415   ALKPHOS 78 09/15/2019 0415   BILITOT 0.7 09/15/2019 0415   BILITOT 0.8 05/19/2019 1719   GFRNONAA 46 (L) 01/04/2020 1546   GFRNONAA 41 (L) 09/13/2019 1714   GFRAA 53 (L) 01/04/2020 1546   GFRAA 48 (L) 09/13/2019 1714   Lab Results  Component Value Date   CHOL 239 (H) 05/19/2019   HDL 40 05/19/2019   LDLCALC 155 (H) 05/19/2019   TRIG 241 (H) 05/19/2019  CHOLHDL 6.0 (H) 05/19/2019   Lab Results  Component Value Date   HGBA1C 6.3 (A) 05/07/2020   Lab Results  Component Value Date   VITAMINB12 424 09/15/2019   Lab Results  Component Value Date   TSH 0.47 05/07/2020      ASSESSMENT AND PLAN 65 y.o. year old male  has a past medical history of ADHD (attention deficit hyperactivity disorder), Anxiety, Chronic kidney disease, Clotting disorder ([place]), Depression, Diabetes mellitus without complication ([place]), Diabetes mellitus, type II ([place]), Dizziness (03/17/2015),  Essential hypertension (06/25/2018), GERD (gastroesophageal reflux disease), HIV disease ([place]), HIV infection ([place]), Liver disease, OSA (obstructive sleep apnea) (07/25/2015), Peripheral vascular disease ([place]), and Ulcer. here with:  OSA on CPAP  -Patient has not been using the CPAP over the last month -Download from a month ago shows good treatment of his apnea when he used the machine -I will increase his pressure 8 to 16 cm of water -Home sleep test will be ordered as the patient is requesting a new machine -Pending his results we will order the new machine - Encourage patient to use CPAP nightly and > 4 hours each night - F/U in 1 year or sooner if needed    Ward Givens, MSN, NP-C 08/29/2020, 3:03 PM [facility] [address] [place], [place] [address] [phone]

## 2020-09-03 ENCOUNTER — Ambulatory Visit: Payer: PPO | Admitting: Endocrinology

## 2020-09-10 ENCOUNTER — Telehealth: Payer: Self-pay

## 2020-09-10 NOTE — Telephone Encounter (Signed)
LVM for pt to call me back to schedule sleep study  

## 2020-09-17 ENCOUNTER — Ambulatory Visit (INDEPENDENT_AMBULATORY_CARE_PROVIDER_SITE_OTHER): Payer: PPO | Admitting: Family Medicine

## 2020-09-17 ENCOUNTER — Ambulatory Visit (INDEPENDENT_AMBULATORY_CARE_PROVIDER_SITE_OTHER)
Admission: RE | Admit: 2020-09-17 | Discharge: 2020-09-17 | Disposition: A | Discharge status: Home / Self Care | Payer: PPO | Source: Ambulatory Visit | Attending: Family Medicine | Admitting: Family Medicine

## 2020-09-17 ENCOUNTER — Other Ambulatory Visit: Payer: Self-pay

## 2020-09-17 ENCOUNTER — Telehealth: Payer: Self-pay

## 2020-09-17 VITALS — BP 136/78 | HR 78 | Temp 98.2°F | Resp 18 | Ht 70.0 in | Wt 250.8 lb

## 2020-09-17 DIAGNOSIS — S20221A Contusion of right back wall of thorax, initial encounter: Secondary | ICD-10-CM | POA: Diagnosis not present

## 2020-09-17 DIAGNOSIS — M792 Neuralgia and neuritis, unspecified: Secondary | ICD-10-CM | POA: Diagnosis not present

## 2020-09-17 DIAGNOSIS — R0789 Other chest pain: Secondary | ICD-10-CM | POA: Diagnosis not present

## 2020-09-17 DIAGNOSIS — M5441 Lumbago with sciatica, right side: Secondary | ICD-10-CM

## 2020-09-17 DIAGNOSIS — S300XXA Contusion of lower back and pelvis, initial encounter: Secondary | ICD-10-CM

## 2020-09-17 DIAGNOSIS — M533 Sacrococcygeal disorders, not elsewhere classified: Secondary | ICD-10-CM

## 2020-09-17 DIAGNOSIS — Z8669 Personal history of other diseases of the nervous system and sense organs: Secondary | ICD-10-CM

## 2020-09-17 DIAGNOSIS — Z9181 History of falling: Secondary | ICD-10-CM | POA: Diagnosis not present

## 2020-09-17 DIAGNOSIS — Z794 Long term (current) use of insulin: Secondary | ICD-10-CM

## 2020-09-17 DIAGNOSIS — M545 Low back pain, unspecified: Secondary | ICD-10-CM | POA: Diagnosis not present

## 2020-09-17 DIAGNOSIS — Z7901 Long term (current) use of anticoagulants: Secondary | ICD-10-CM

## 2020-09-17 DIAGNOSIS — I700 Atherosclerosis of aorta: Secondary | ICD-10-CM | POA: Diagnosis not present

## 2020-09-17 DIAGNOSIS — E1122 Type 2 diabetes mellitus with diabetic chronic kidney disease: Secondary | ICD-10-CM | POA: Diagnosis not present

## 2020-09-17 DIAGNOSIS — N183 Chronic kidney disease, stage 3 unspecified: Secondary | ICD-10-CM

## 2020-09-17 DIAGNOSIS — E1142 Type 2 diabetes mellitus with diabetic polyneuropathy: Secondary | ICD-10-CM | POA: Diagnosis not present

## 2020-09-17 DIAGNOSIS — I878 Other specified disorders of veins: Secondary | ICD-10-CM | POA: Diagnosis not present

## 2020-09-17 DIAGNOSIS — Z5181 Encounter for therapeutic drug level monitoring: Secondary | ICD-10-CM | POA: Diagnosis not present

## 2020-09-17 DIAGNOSIS — I517 Cardiomegaly: Secondary | ICD-10-CM | POA: Diagnosis not present

## 2020-09-17 LAB — CBC
HCT: 48.2 % (ref 39.0–52.0)
Hemoglobin: 17.2 g/dL — ABNORMAL HIGH (ref 13.0–17.0)
MCHC: 35.6 g/dL (ref 30.0–36.0)
MCV: 95 fl (ref 78.0–100.0)
Platelets: 146 10*3/uL — ABNORMAL LOW (ref 150.0–400.0)
RBC: 5.07 Mil/uL (ref 4.22–5.81)
RDW: 13.9 % (ref 11.5–15.5)
WBC: 7.2 10*3/uL (ref 4.0–10.5)

## 2020-09-17 NOTE — Telephone Encounter (Signed)
Returned pt's call. No answer, LVM for pt to call me back to schedule sleep study

## 2020-09-17 NOTE — Progress Notes (Signed)
Subjective:  Patient ID: Daniel [name], male    DOB: [birth year]  Age: 65 y.o. MRN: [id number]  CC:  Chief Complaint  Patient presents with   Fall    Pt reports fall last night did not go to ED due to having appt today reports severe back pain in lower back pain score of 8     HPI Daniel [name] presents for   Daniel [name] for routine follow-up for medications, but had fall at home last night  Fall at home.  History of falls in the past, admitted in July of last year with generalized weakness and falling.  Encephalopathy.  Possible contributions of diabetic neuropathy affecting gait, chronic back pain, sciatica, leg pain, prior toe amputations and polypharmacy.  He is followed by psychiatry, and has discussed medication regimen with his psychiatrist Dr. Toy Care when we did a hospital follow-up in August of last year.  Had stopped his Belsomra and was cutting back on Adderall dosage additionally was having some chronic knee pain and leg pain at that time, followed by orthopedist.  He was not using any assistive device previously, other than shower chair.  He has a walker and canes at home and I have recommended he use those previously.  Follow-up visit in September 2021 had some leg weakness, sciatica symptoms with chronic low back pain.  Degenerative disc disease noted on previous imaging.  He has refused physical therapy.  On ladder last night - lost balance off second step, fell backwards onto back. Fell onto back of back, and head. No LOC. Pain in back, buttocks and back of head. Some chronic back pain - same pain as in past, more in side - R side to R buttock.  Took 3 tylenol. Some relief. Tylenol again this am - some relief.  No further pain in back of head, no headache today.  Sore in muscle of front of R chest noted today. Able to move arms ok, breathing ok, no dyspnea, no hemoptysis.  Not sore to push, sore with deep breath in.  Prior to recent fall had one 2 weeks prior - off balance  that time as well.    Knees are doing better from last year    Followed by infectious disease with hx of HIV.   Type 2 diabetes with stage III CKD, diabetic neuropathy. History of insulin-dependent diabetes followed by endocrinology, with prior reports of hypoglycemia.  Treated with Trulicity, Lantus, Lyrica 318m BID.  No recent symptomatic lows.  Lab Results  Component Value Date   HGBA1C 6.3 (A) 05/07/2020   Lab Results  Component Value Date   CREATININE 1.54 (H) 05/07/2020   Hyperlipidemia: Zetia 10 mg daily.  Previous elevated CPK, transaminitis, myalgias with statins.  Continued on Zetia from cardiology in November 2021. Lab Results  Component Value Date   CHOL 239 (H) 05/19/2019   HDL 40 05/19/2019   LDLCALC 155 (H) 05/19/2019   TRIG 241 (H) 05/19/2019   CHOLHDL 6.0 (H) 05/19/2019   Lab Results  Component Value Date   ALT 24 09/15/2019   AST 21 09/15/2019   ALKPHOS 78 09/15/2019   BILITOT 0.7 09/15/2019   Hypertension: With CKD as above, nephrology Dr. GMoshe Cipro Last visit last year.  Prior on amlodipine. Not sure he is on it.  With OSA on CPAP. Met with sleep specialist few weeks ago. Increased pressure 8-16.  New test ordered.  Not taking furosemide d/t urination - has incontinence pads. If needed. Appt 8/5 with  cardiology.  BP Readings from Last 3 Encounters:  09/17/20 136/78  08/29/20 (!) 159/93  05/07/20 140/76   Lab Results  Component Value Date   CREATININE 1.54 (H) 05/07/2020   Chronic anticoagulation On lifelong Xarelto for prior DVT, 20 mg daily Lab Results  Component Value Date   WBC 6.0 09/15/2019   HGB 15.9 09/15/2019   HCT 46.8 09/15/2019   MCV 95.3 09/15/2019   PLT 125 (L) 09/15/2019  No new bleeding.   Hypothyroidism: Treated by psychiatry.  Lab Results  Component Value Date   TSH 0.47 05/07/2020   History of migraine headaches Treated with Depakote 500 mg daily. Doing ok - no recent migraine. No seizures.    History Patient Active Problem List   Diagnosis Date Noted   Encounter for long-term (current) use of high-risk medication 03/21/2020   Unilateral primary osteoarthritis, right knee 12/15/2019   Generalized weakness 09/14/2019   Severe major depression ([place]) 11/17/2018   Class 2 obesity due to excess calories without serious comorbidity with body mass index (BMI) of 36.0 to 36.9 in adult 11/17/2018   Diabetes ([place]) 11/09/2018   Anticoagulated 08/24/2018   Dyslipidemia 07/27/2018   Essential hypertension 06/25/2018   Hypogonadism in male 05/10/2018   Numbness 03/31/2018   Diabetic peripheral neuropathy ([place]) 01/25/2018   History of syncope 01/25/2018   S/P transmetatarsal amputation of foot, right ([place]) 10/09/2017   Osteomyelitis ([place])    Hypothyroidism    Constipation    History of DVT (deep vein thrombosis) 09/30/2017   Obesity, Class III, BMI 40-49.9 (morbid obesity) ([place]) 01/15/2017   DOE (dyspnea on exertion) 01/13/2017   Chronic migraine 09/17/2016   Gait abnormality 09/17/2016   Diabetic foot infection ([place]) 06/28/2016   Routine screening for STI (sexually transmitted infection) 02/11/2016   Fall 12/05/2015   Toe ulcer, right ([place]) 09/19/2015   Decreased pedal pulses 09/19/2015   OSA on CPAP 09/05/2015   Major depressive disorder, recurrent episode, moderate ([name]) 09/05/2015   Low back pain 06/11/2015   Abnormality of gait 06/11/2015   Dizziness 03/17/2015   Weakness 02/21/2015   Chronic renal insufficiency, stage III (moderate) ([place]) 08/09/2014   Insulin-requiring or dependent type II diabetes mellitus ([place]) 11/07/2013   Hematuria 06/21/2013   Hepatic steatosis 09/09/2010   Urinary tract infection without hematuria 09/15/2006   Human immunodeficiency virus (HIV) disease ([place]) 06/04/2006   HERPES ZOSTER, UNCOMPLICATED 67/01/4579   Depression 06/04/2006   THROMBOPHLEBITIS NOS 06/04/2006   GERD 06/04/2006   ARTHRITIS, HAND 06/04/2006   Past Medical History:   Diagnosis Date   ADHD (attention deficit hyperactivity disorder)    Anxiety    Chronic kidney disease    Clotting disorder ([place])    Depression    Diabetes mellitus without complication ([place])    Diabetes mellitus, type II ([place])    Dizziness 03/17/2015   Essential hypertension 06/25/2018   GERD (gastroesophageal reflux disease)    HIV disease ([name])    HIV infection ([place])    Liver disease    OSA (obstructive sleep apnea) 07/25/2015   Uses CPAP regularly   Peripheral vascular disease ([place])    Ulcer    Past Surgical History:  Procedure Laterality Date   AMPUTATION Right 10/02/2017   Procedure: RIGHT TRANSMETATARSAL AMPUTATION;  Surgeon: Leandrew Koyanagi, MD;  Location: [facility];  Service: Orthopedics;  Laterality: Right;   SMALL INTESTINE SURGERY     STOMACH SURGERY     TOE AMPUTATION Right 08/2016   right great toe  Allergies  Allergen Reactions   Aspirin Swelling   Ibuprofen Swelling   Sustiva [Efavirenz] Swelling and Rash   Nsaids Other (See Comments)    unknwn   Lipitor [Atorvastatin Calcium] Other (See Comments)    Leg pain   Prior to Admission medications   Medication Sig Start Date End Date Taking? Authorizing Provider  acetaminophen (TYLENOL) 500 MG tablet Take 1,500 mg by mouth in the morning and at bedtime.   Yes [provider]  ALPRAZolam Duanne Moron) 1 MG tablet Take 1 mg by mouth at bedtime. *May take one tablet up to 4 times daily as needed for anxiety   Yes [provider]  amphetamine-dextroamphetamine (ADDERALL) 30 MG tablet Take 30 mg by mouth 3 (three) times daily.   Yes [provider]  B-D ULTRAFINE III SHORT PEN 31G X 8 MM MISC USE AS DIRECTED THREE TIMES DAILY 06/14/20  Yes Renato Shin, MD  brexpiprazole (REXULTI) 1 MG TABS tablet Take 1 mg by mouth at bedtime.   Yes [provider]  Continuous Blood Gluc Sensor (FREESTYLE LIBRE 14 DAY SENSOR) MISC USE AS DIRECTED EVERY 14 DAYS 11/18/19  Yes Renato Shin, MD  diclofenac Sodium  (VOLTAREN) 1 % GEL APPLY 2 GRAMS TOPICALLY TO EACH KNEE IN THE MORNING AND AT BEDTIME. APPLY 1 GRAM TO EACH KNEE IN THE AFTERNOON 08/15/20  Yes [name], MD  divalproex (DEPAKOTE ER) 500 MG 24 hr tablet TAKE 1 TABLET(500 MG) BY MOUTH AT BEDTIME 08/15/20  Yes [name], MD  Dulaglutide (TRULICITY) 3 JE/5.6DJ SOPN Inject 3 mg into the skin once a week. 05/07/20  Yes Renato Shin, MD  ezetimibe (ZETIA) 10 MG tablet TAKE 1 TABLET(10 MG) BY MOUTH DAILY 03/13/20  Yes Skeet Latch, MD  Insulin Aspart FlexPen 100 UNIT/ML SOPN INJECT 15 UNITS UNDER THE SKIN WITH BREAKFAST AND 25 UNITS WITH SUPPER 08/17/20  Yes Renato Shin, MD  insulin glargine (LANTUS SOLOSTAR) 100 UNIT/ML Solostar Pen Inject 110 Units into the skin every morning. 08/28/20  Yes Renato Shin, MD  levothyroxine (SYNTHROID, LEVOTHROID) 50 MCG tablet Take 1 tablet (50 mcg total) by mouth at bedtime. 07/24/15  Yes [name], MD  ondansetron (ZOFRAN) 8 MG tablet TAKE 1 TABLET(8 MG) BY MOUTH EVERY 8 HOURS AS NEEDED FOR NAUSEA OR VOMITING 10/21/19  Yes Comer, Okey Regal, MD  pantoprazole (PROTONIX) 40 MG tablet TAKE 1 TABLET(40 MG) BY MOUTH DAILY AT 6 AM 02/09/20  Yes [name], MD  pregabalin (LYRICA) 150 MG capsule TAKE 2 CAPSULE BY MOUTH TWICE DAILY 04/12/20  Yes [name], MD  protriptyline (VIVACTIL) 10 MG tablet Take 10 mg by mouth 3 (three) times daily.  01/30/16  Yes [provider]  TRINTELLIX 20 MG TABS tablet Take 20 mg by mouth at bedtime.  01/26/15  Yes [provider]  TRIUMEQ 600-50-300 MG tablet TAKE 1 TABLET BY MOUTH DAILY 04/11/20  Yes Comer, Okey Regal, MD  XARELTO 20 MG TABS tablet TAKE 1 TABLET(20 MG) BY MOUTH DAILY WITH SUPPER 06/19/20  Yes [name], MD  zolpidem (AMBIEN) 10 MG tablet Take 10 mg by mouth at bedtime.   Yes [provider]   Social History   Socioeconomic History   Marital status: Single    Spouse name: Not on file   Number of children: Not on  file   Years of education: Not on file   Highest education level: Not on file  Occupational History    Comment:  DISABILITY  Tobacco Use   Smoking status: Former    Packs/day: 0.10    Years: 10.00    Pack years: 1.00    Types: Cigars, Cigarettes    Quit date: 08/09/2014    Years since quitting: 6.1   Smokeless tobacco: Never  Vaping Use   Vaping Use: Never used  Substance and Sexual Activity   Alcohol use: No    Alcohol/week: 0.0 standard drinks   Drug use: No   Sexual activity: Not Currently    Partners: Male    Comment: pt. declined condoms  Other Topics Concern   Not on file  Social History Narrative   Epworth Sleepiness Scale = 7 (as of 03/16/2015)   Social Determinants of Health   Financial Resource Strain: Not on file  Food Insecurity: Not on file  Transportation Needs: Not on file  Physical Activity: Not on file  Stress: Not on file  Social Connections: Not on file  Intimate Partner Violence: Not on file    Review of Systems Per HPI.   Objective:   Vitals:   09/17/20 1038  BP: 136/78  Pulse: 78  Resp: 18  Temp: 98.2 F (36.8 C)  TempSrc: Temporal  SpO2: 97%  Weight: 250 lb 12.8 oz (113.8 kg)  Height: 5' 10"  (1.778 m)     Physical Exam Vitals reviewed.  Constitutional:      Appearance: He is well-developed.  HENT:     Head: Normocephalic and atraumatic.  Neck:     Vascular: No carotid bruit or JVD.  Cardiovascular:     Rate and Rhythm: Normal rate and regular rhythm.     Heart sounds: Normal heart sounds. No murmur heard. Pulmonary:     Effort: Pulmonary effort is normal.     Breath sounds: Normal breath sounds. No rales.  Musculoskeletal:     Right lower leg: Edema (1-2+ lower 1/3 tibia bilat.) present.     Left lower leg: Edema present.     Comments: Chest wall nontender but reports area of discomfort at the right anterior chest wall, midclavicular line with deep breath only.  Equal air sounds bilaterally, no respiratory  distress.  Thoracic and lumbar spine without midline focal bony tenderness.  Slight discomfort above the right paraspinals.  Pain at the right buttock, ischial area as well as the distal sacrum towards coccyx.  Skin intact without apparent bruising.  Pain free hip internal/external rotation.  Negative seated straight leg raise.  Skin:    General: Skin is warm and dry.  Neurological:     Mental Status: He is alert and oriented to person, place, and time.  Psychiatric:        Mood and Affect: Mood normal.     56 minutes spent during visit, including chart review, counseling and assimilation of information, review of meds and clarification of history, exam, discussion of plan, and chart completion.    Assessment & Plan:  Daniel [name] is a 65 y.o. male . Personal history of fall - Plan: DG Lumbar Spine Complete Right-sided low back pain with right-sided sciatica, unspecified chronicity - Plan: DG Lumbar Spine Complete Contusion, back, right, initial encounter Right-sided chest wall pain - Plan: DG Ribs Unilateral W/Chest Right Sacral pain - Plan: DG Sacrum/Coccyx Contusion, buttock, initial encounter - Plan: DG Pelvis 1-2 Views  -Fall at home as above.  Possible contributors of his neuropathy, balance issues with previous toe amputations, and medication side effects.  Reports striking his head but initial discomfort resolved,  denies current headache or new neurologic symptoms.  -again discussed importance of fall precautions at home including using assistive device.  He has a cane and walker but not using.  Refuses PT.  -Check imaging to rule out fracture but likely contusion of buttocks with flare of lumbar pain, chronic low back pain.  Treated with Tylenol, Lyrica for now.  ER precautions if any worsening symptoms or return of headache.  Diabetic peripheral neuropathy ([name]) Peripheral neuropathic pain  -Continue Lyrica same dose.  Plan to evaluate meds at upcoming visit, advised him to  bring all his medications from home.  History of migraine  -Stable with Depakote, denies recent migraine, denies seizure.  Continue same dose  Type 2 diabetes mellitus with stage 3 chronic kidney disease, with long-term current use of insulin, unspecified whether stage 3a or 3b CKD ([place]) - Plan: Ambulatory referral to Nephrology  -Medic hypoglycemia, continue follow-up with endocrinology.  With history of CKD we will place referral back to his nephrologist as he reports referral was needed.  Chronic anticoagulation - Plan: CBC Medication monitoring encounter - Plan: CBC  -Tolerating anticoagulation without any bleeding, including with recent fall.  Check CBC.  No orders of the defined types were placed in this encounter.  Patient Instructions  I recommend against use of ladders due to your history of falls.  I also recommend use of cane or walker for assistance in the home as you are at risk of another fall.  Tylenol ok for now, but if any return of headache - be seen in the ER Back pain should also improve with some time, but I will check xrays to make sure it looks stable.   If any concerns on labs, I will let you know.  Please bring meds to next visit to make sure we have accurate info. Also bring those to cardiology - you may need to be back on fluid pill due to swelling.    Return to the clinic or go to the nearest emergency room if any of your symptoms worsen or new symptoms occur. Chest Wall Pain Chest wall pain is pain in or around the bones and muscles of your chest. Sometimes, an injury causes this pain. Excessive coughing or overuse of arm and chest muscles may also cause chest wall pain. Sometimes, the cause may not beknown. This pain may take several weeks or longer to get better. Follow these instructions at home: Managing pain, stiffness, and swelling  If directed, put ice on the painful area: Put ice in a plastic bag. Place a towel between your skin and the  bag. Leave the ice on for 20 minutes, 2-3 times per day.  Activity Rest as told by your health care provider. Avoid activities that cause pain. These include any activities that use your chest muscles or your abdominal and side muscles to lift heavy items. Ask your health care provider what activities are safe for you. General instructions  Take over-the-counter and prescription medicines only as told by your health care provider. Do not use any products that contain nicotine or tobacco, such as cigarettes, e-cigarettes, and chewing tobacco. These can delay healing after injury. If you need help quitting, ask your health care provider. Keep all follow-up visits as told by your health care provider. This is important.  Contact a health care provider if: You have a fever. Your chest pain becomes worse. You have new symptoms. Get help right away if: You have nausea or vomiting. You feel sweaty or  light-headed. You have a cough with mucus from your lungs (sputum) or you cough up blood. You develop shortness of breath. These symptoms may represent a serious problem that is an emergency. Do not wait to see if the symptoms will go away. Get medical help right away. Call your local emergency services (911 in the [place] Do not drive yourself to the hospital. Summary Chest wall pain is pain in or around the bones and muscles of your chest. Depending on the cause, it may be treated with ice, rest, medicines, and avoiding activities that cause pain. Contact a health care provider if you have a fever, worsening chest pain, or new symptoms. Get help right away if you feel light-headed or you develop shortness of breath. These symptoms may be an emergency. This information is not intended to replace advice given to you by your health care provider. Make sure you discuss any questions you have with your healthcare provider. Document Revised: 08/13/2017 Document Reviewed: 08/13/2017 Elsevier Patient  Education  2022 [place]. Acute Back Pain, Adult Acute back pain is sudden and usually short-lived. It is often caused by an injury to the muscles and tissues in the back. The injury may result from: A muscle or ligament getting overstretched or torn (strained). Ligaments are tissues that connect bones to each other. Lifting something improperly can cause a back strain. Wear and tear (degeneration) of the spinal disks. Spinal disks are circular tissue that provide cushioning between the bones of the spine (vertebrae). Twisting motions, such as while playing sports or doing yard work. A hit to the back. Arthritis. You may have a physical exam, lab tests, and imaging tests to find the cause ofyour pain. Acute back pain usually goes away with rest and home care. Follow these instructions at home: Managing pain, stiffness, and swelling Treatment may include medicines for pain and inflammation that are taken by mouth or applied to the skin, prescription pain medicine, or muscle relaxants. Take over-the-counter and prescription medicines only as told by your health care provider. Your health care provider may recommend applying ice during the first 24-48 hours after your pain starts. To do this: Put ice in a plastic bag. Place a towel between your skin and the bag. Leave the ice on for 20 minutes, 2-3 times a day. If directed, apply heat to the affected area as often as told by your health care provider. Use the heat source that your health care provider recommends, such as a moist heat pack or a heating pad. Place a towel between your skin and the heat source. Leave the heat on for 20-30 minutes. Remove the heat if your skin turns bright red. This is especially important if you are unable to feel pain, heat, or cold. You have a greater risk of getting burned. Activity  Do not stay in bed. Staying in bed for more than 1-2 days can delay your recovery. Sit up and stand up straight. Avoid leaning  forward when you sit or hunching over when you stand. If you work at a desk, sit close to it so you do not need to lean over. Keep your chin tucked in. Keep your neck drawn back, and keep your elbows bent at a 90-degree angle (right angle). Sit high and close to the steering wheel when you drive. Add lower back (lumbar) support to your car seat, if needed. Take short walks on even surfaces as soon as you are able. Try to increase the length of time you walk  each day. Do not sit, drive, or stand in one place for more than 30 minutes at a time. Sitting or standing for long periods of time can put stress on your back. Do not drive or use heavy machinery while taking prescription pain medicine. Use proper lifting techniques. When you bend and lift, use positions that put less stress on your back: Leal your knees. Keep the load close to your body. Avoid twisting. Exercise regularly as told by your health care provider. Exercising helps your back heal faster and helps prevent back injuries by keeping muscles strong and flexible. Work with a physical therapist to make a safe exercise program, as recommended by your health care provider. Do any exercises as told by your physical therapist.  Lifestyle Maintain a healthy weight. Extra weight puts stress on your back and makes it difficult to have good posture. Avoid activities or situations that make you feel anxious or stressed. Stress and anxiety increase muscle tension and can make back pain worse. Learn ways to manage anxiety and stress, such as through exercise. General instructions Sleep on a firm mattress in a comfortable position. Try lying on your side with your knees slightly bent. If you lie on your back, put a pillow under your knees. Follow your treatment plan as told by your health care provider. This may include: Cognitive or behavioral therapy. Acupuncture or massage therapy. Meditation or yoga. Contact a health care provider if: You  have pain that is not relieved with rest or medicine. You have increasing pain going down into your legs or buttocks. Your pain does not improve after 2 weeks. You have pain at night. You lose weight without trying. You have a fever or chills. Get help right away if: You develop new bowel or bladder control problems. You have unusual weakness or numbness in your arms or legs. You develop nausea or vomiting. You develop abdominal pain. You feel faint. Summary Acute back pain is sudden and usually short-lived. Use proper lifting techniques. When you bend and lift, use positions that put less stress on your back. Take over-the-counter and prescription medicines and apply heat or ice as directed by your health care provider. This information is not intended to replace advice given to you by your health care provider. Make sure you discuss any questions you have with your healthcare provider. Document Revised: 11/01/2019 Document Reviewed: 11/04/2019 Elsevier Patient Education  2022 [place] Prevention in the Home, Adult Falls can cause injuries and can affect people from all age groups. There are many simple things that you can do to make your home safe and to help preventfalls. Ask for help when making these changes, if needed. What actions can I take to prevent falls? General instructions Use good lighting in all rooms. Replace any light bulbs that burn out. Turn on lights if it is dark. Use night-lights. Place frequently used items in easy-to-reach places. Lower the shelves around your home if necessary. Set up furniture so that there are clear paths around it. Avoid moving your furniture around. Remove throw rugs and other tripping hazards from the floor. Avoid walking on wet floors. Fix any uneven floor surfaces. Add color or contrast paint or tape to grab bars and handrails in your home. Place contrasting color strips on the first and last steps of stairways. When  you use a stepladder, make sure that it is completely opened and that the sides are firmly locked. Have someone hold the ladder while you  are using it. Do not climb a closed stepladder. Be aware of any and all pets. What can I do in the bathroom?     Keep the floor dry. Immediately clean up any water that spills onto the floor. Remove soap buildup in the tub or shower on a regular basis. Use non-skid mats or decals on the floor of the tub or shower. Attach bath mats securely with double-sided, non-slip rug tape. If you need to sit down while you are in the shower, use a plastic, non-slip stool. Install grab bars by the toilet and in the tub and shower. Do not use towel bars as grab bars. What can I do in the bedroom? Make sure that a bedside light is easy to reach. Do not use oversized bedding that drapes onto the floor. Have a firm chair that has side arms to use for getting dressed. What can I do in the kitchen? Clean up any spills right away. If you need to reach for something above you, use a sturdy step stool that has a grab bar. Keep electrical cables out of the way. Do not use floor polish or wax that makes floors slippery. If you must use wax, make sure that it is non-skid floor wax. What can I do in the stairways? Do not leave any items on the stairs. Make sure that you have a light switch at the top of the stairs and the bottom of the stairs. Have them installed if you do not have them. Make sure that there are handrails on both sides of the stairs. Fix handrails that are broken or loose. Make sure that handrails are as long as the stairways. Install non-slip stair treads on all stairs in your home. Avoid having throw rugs at the top or bottom of stairways, or secure the rugs with carpet tape to prevent them from moving. Choose a carpet design that does not hide the edge of steps on the stairway. Check any carpeting to make sure that it is firmly attached to the stairs. Fix any  carpet that is loose or worn. What can I do on the outside of my home? Use bright outdoor lighting. Regularly repair the edges of walkways and driveways and fix any cracks. Remove high doorway thresholds. Trim any shrubbery on the main path into your home. Regularly check that handrails are securely fastened and in good repair. Both sides of any steps should have handrails. Install guardrails along the edges of any raised decks or porches. Clear walkways of debris and clutter, including tools and rocks. Have leaves, snow, and ice cleared regularly. Use sand or salt on walkways during winter months. In the garage, clean up any spills right away, including grease or oil spills. What other actions can I take? Wear closed-toe shoes that fit well and support your feet. Wear shoes that have rubber soles or low heels. Use mobility aids as needed, such as canes, walkers, scooters, and crutches. Review your medicines with your health care provider. Some medicines can cause dizziness or changes in blood pressure, which increase your risk of falling. Talk with your health care provider about other ways that you can decrease your risk of falls. This may include working with a physical therapist or trainer toimprove your strength, balance, and endurance. Where to find more information Centers for Disease Control and Prevention, [place]: WebmailGuide.co.za [employer] on Aging: BrainJudge.co.uk Contact a health care provider if: You are afraid of falling at home. You feel weak, drowsy, or dizzy  at home. You fall at home. Summary There are many simple things that you can do to make your home safe and to help prevent falls. Ways to make your home safe include removing tripping hazards and installing grab bars in the bathroom. Ask for help when making these changes in your home. This information is not intended to replace advice given to you by your health care provider. Make sure you  discuss any questions you have with your healthcare provider. Document Revised: 01/23/2017 Document Reviewed: 09/25/2016 Elsevier Patient Education  2021 [place],   [name], MD [place], [facility] 09/17/20 11:40 AM

## 2020-09-17 NOTE — Patient Instructions (Addendum)
I recommend against use of ladders due to your history of falls.  I also recommend use of cane or walker for assistance in the home as you are at risk of another fall.  Tylenol ok for now, but if any return of headache - be seen in the ER Back pain should also improve with some time, but I will check xrays to make sure it looks stable.   If any concerns on labs, I will let you know.  Please bring meds to next visit to make sure we have accurate info. Also bring those to cardiology - you may need to be back on fluid pill due to swelling.    Return to the clinic or go to the nearest emergency room if any of your symptoms worsen or new symptoms occur. Chest Wall Pain Chest wall pain is pain in or around the bones and muscles of your chest. Sometimes, an injury causes this pain. Excessive coughing or overuse of arm and chest muscles may also cause chest wall pain. Sometimes, the cause may not beknown. This pain may take several weeks or longer to get better. Follow these instructions at home: Managing pain, stiffness, and swelling  If directed, put ice on the painful area: Put ice in a plastic bag. Place a towel between your skin and the bag. Leave the ice on for 20 minutes, 2-3 times per day.  Activity Rest as told by your health care provider. Avoid activities that cause pain. These include any activities that use your chest muscles or your abdominal and side muscles to lift heavy items. Ask your health care provider what activities are safe for you. General instructions  Take over-the-counter and prescription medicines only as told by your health care provider. Do not use any products that contain nicotine or tobacco, such as cigarettes, e-cigarettes, and chewing tobacco. These can delay healing after injury. If you need help quitting, ask your health care provider. Keep all follow-up visits as told by your health care provider. This is important.  Contact a health care provider if: You  have a fever. Your chest pain becomes worse. You have new symptoms. Get help right away if: You have nausea or vomiting. You feel sweaty or light-headed. You have a cough with mucus from your lungs (sputum) or you cough up blood. You develop shortness of breath. These symptoms may represent a serious problem that is an emergency. Do not wait to see if the symptoms will go away. Get medical help right away. Call your local emergency services (911 in the [place] Do not drive yourself to the hospital. Summary Chest wall pain is pain in or around the bones and muscles of your chest. Depending on the cause, it may be treated with ice, rest, medicines, and avoiding activities that cause pain. Contact a health care provider if you have a fever, worsening chest pain, or new symptoms. Get help right away if you feel light-headed or you develop shortness of breath. These symptoms may be an emergency. This information is not intended to replace advice given to you by your health care provider. Make sure you discuss any questions you have with your healthcare provider. Document Revised: 08/13/2017 Document Reviewed: 08/13/2017 Elsevier Patient Education  2022 [place]. Acute Back Pain, Adult Acute back pain is sudden and usually short-lived. It is often caused by an injury to the muscles and tissues in the back. The injury may result from: A muscle or ligament getting overstretched or torn (strained). Ligaments are  tissues that connect bones to each other. Lifting something improperly can cause a back strain. Wear and tear (degeneration) of the spinal disks. Spinal disks are circular tissue that provide cushioning between the bones of the spine (vertebrae). Twisting motions, such as while playing sports or doing yard work. A hit to the back. Arthritis. You may have a physical exam, lab tests, and imaging tests to find the cause ofyour pain. Acute back pain usually goes away with rest and home  care. Follow these instructions at home: Managing pain, stiffness, and swelling Treatment may include medicines for pain and inflammation that are taken by mouth or applied to the skin, prescription pain medicine, or muscle relaxants. Take over-the-counter and prescription medicines only as told by your health care provider. Your health care provider may recommend applying ice during the first 24-48 hours after your pain starts. To do this: Put ice in a plastic bag. Place a towel between your skin and the bag. Leave the ice on for 20 minutes, 2-3 times a day. If directed, apply heat to the affected area as often as told by your health care provider. Use the heat source that your health care provider recommends, such as a moist heat pack or a heating pad. Place a towel between your skin and the heat source. Leave the heat on for 20-30 minutes. Remove the heat if your skin turns bright red. This is especially important if you are unable to feel pain, heat, or cold. You have a greater risk of getting burned. Activity  Do not stay in bed. Staying in bed for more than 1-2 days can delay your recovery. Sit up and stand up straight. Avoid leaning forward when you sit or hunching over when you stand. If you work at a desk, sit close to it so you do not need to lean over. Keep your chin tucked in. Keep your neck drawn back, and keep your elbows bent at a 90-degree angle (right angle). Sit high and close to the steering wheel when you drive. Add lower back (lumbar) support to your car seat, if needed. Take short walks on even surfaces as soon as you are able. Try to increase the length of time you walk each day. Do not sit, drive, or stand in one place for more than 30 minutes at a time. Sitting or standing for long periods of time can put stress on your back. Do not drive or use heavy machinery while taking prescription pain medicine. Use proper lifting techniques. When you bend and lift, use positions  that put less stress on your back: [place] your knees. Keep the load close to your body. Avoid twisting. Exercise regularly as told by your health care provider. Exercising helps your back heal faster and helps prevent back injuries by keeping muscles strong and flexible. Work with a physical therapist to make a safe exercise program, as recommended by your health care provider. Do any exercises as told by your physical therapist.  Lifestyle Maintain a healthy weight. Extra weight puts stress on your back and makes it difficult to have good posture. Avoid activities or situations that make you feel anxious or stressed. Stress and anxiety increase muscle tension and can make back pain worse. Learn ways to manage anxiety and stress, such as through exercise. General instructions Sleep on a firm mattress in a comfortable position. Try lying on your side with your knees slightly bent. If you lie on your back, put a pillow under your knees. Follow your  treatment plan as told by your health care provider. This may include: Cognitive or behavioral therapy. Acupuncture or massage therapy. Meditation or yoga. Contact a health care provider if: You have pain that is not relieved with rest or medicine. You have increasing pain going down into your legs or buttocks. Your pain does not improve after 2 weeks. You have pain at night. You lose weight without trying. You have a fever or chills. Get help right away if: You develop new bowel or bladder control problems. You have unusual weakness or numbness in your arms or legs. You develop nausea or vomiting. You develop abdominal pain. You feel faint. Summary Acute back pain is sudden and usually short-lived. Use proper lifting techniques. When you bend and lift, use positions that put less stress on your back. Take over-the-counter and prescription medicines and apply heat or ice as directed by your health care provider. This information is not  intended to replace advice given to you by your health care provider. Make sure you discuss any questions you have with your healthcare provider. Document Revised: 11/01/2019 Document Reviewed: 11/04/2019 Elsevier Patient Education  2022 [place] Prevention in the Home, Adult Falls can cause injuries and can affect people from all age groups. There are many simple things that you can do to make your home safe and to help preventfalls. Ask for help when making these changes, if needed. What actions can I take to prevent falls? General instructions Use good lighting in all rooms. Replace any light bulbs that burn out. Turn on lights if it is dark. Use night-lights. Place frequently used items in easy-to-reach places. Lower the shelves around your home if necessary. Set up furniture so that there are clear paths around it. Avoid moving your furniture around. Remove throw rugs and other tripping hazards from the floor. Avoid walking on wet floors. Fix any uneven floor surfaces. Add color or contrast paint or tape to grab bars and handrails in your home. Place contrasting color strips on the first and last steps of stairways. When you use a stepladder, make sure that it is completely opened and that the sides are firmly locked. Have someone hold the ladder while you are using it. Do not climb a closed stepladder. Be aware of any and all pets. What can I do in the bathroom?     Keep the floor dry. Immediately clean up any water that spills onto the floor. Remove soap buildup in the tub or shower on a regular basis. Use non-skid mats or decals on the floor of the tub or shower. Attach bath mats securely with double-sided, non-slip rug tape. If you need to sit down while you are in the shower, use a plastic, non-slip stool. Install grab bars by the toilet and in the tub and shower. Do not use towel bars as grab bars. What can I do in the bedroom? Make sure that a bedside light is  easy to reach. Do not use oversized bedding that drapes onto the floor. Have a firm chair that has side arms to use for getting dressed. What can I do in the kitchen? Clean up any spills right away. If you need to reach for something above you, use a sturdy step stool that has a grab bar. Keep electrical cables out of the way. Do not use floor polish or wax that makes floors slippery. If you must use wax, make sure that it is non-skid floor wax. What can I  do in the stairways? Do not leave any items on the stairs. Make sure that you have a light switch at the top of the stairs and the bottom of the stairs. Have them installed if you do not have them. Make sure that there are handrails on both sides of the stairs. Fix handrails that are broken or loose. Make sure that handrails are as long as the stairways. Install non-slip stair treads on all stairs in your home. Avoid having throw rugs at the top or bottom of stairways, or secure the rugs with carpet tape to prevent them from moving. Choose a carpet design that does not hide the edge of steps on the stairway. Check any carpeting to make sure that it is firmly attached to the stairs. Fix any carpet that is loose or worn. What can I do on the outside of my home? Use bright outdoor lighting. Regularly repair the edges of walkways and driveways and fix any cracks. Remove high doorway thresholds. Trim any shrubbery on the main path into your home. Regularly check that handrails are securely fastened and in good repair. Both sides of any steps should have handrails. Install guardrails along the edges of any raised decks or porches. Clear walkways of debris and clutter, including tools and rocks. Have leaves, snow, and ice cleared regularly. Use sand or salt on walkways during winter months. In the garage, clean up any spills right away, including grease or oil spills. What other actions can I take? Wear closed-toe shoes that fit well and  support your feet. Wear shoes that have rubber soles or low heels. Use mobility aids as needed, such as canes, walkers, scooters, and crutches. Review your medicines with your health care provider. Some medicines can cause dizziness or changes in blood pressure, which increase your risk of falling. Talk with your health care provider about other ways that you can decrease your risk of falls. This may include working with a physical therapist or trainer toimprove your strength, balance, and endurance. Where to find more information Centers for Disease Control and Prevention, [place]: WebmailGuide.co.za [employer] on Aging: BrainJudge.co.uk Contact a health care provider if: You are afraid of falling at home. You feel weak, drowsy, or dizzy at home. You fall at home. Summary There are many simple things that you can do to make your home safe and to help prevent falls. Ways to make your home safe include removing tripping hazards and installing grab bars in the bathroom. Ask for help when making these changes in your home. This information is not intended to replace advice given to you by your health care provider. Make sure you discuss any questions you have with your healthcare provider. Document Revised: 01/23/2017 Document Reviewed: 09/25/2016 Elsevier Patient Education  2021 [employer].

## 2020-09-17 NOTE — Telephone Encounter (Signed)
LVM for pt to call me back to schedule sleep study  

## 2020-09-18 ENCOUNTER — Telehealth: Payer: Self-pay

## 2020-09-18 ENCOUNTER — Other Ambulatory Visit: Payer: Self-pay | Admitting: Internal Medicine

## 2020-09-18 ENCOUNTER — Other Ambulatory Visit: Payer: Self-pay | Admitting: Family Medicine

## 2020-09-18 DIAGNOSIS — M25562 Pain in left knee: Secondary | ICD-10-CM

## 2020-09-18 DIAGNOSIS — G8929 Other chronic pain: Secondary | ICD-10-CM

## 2020-09-18 DIAGNOSIS — K219 Gastro-esophageal reflux disease without esophagitis: Secondary | ICD-10-CM

## 2020-09-18 DIAGNOSIS — B20 Human immunodeficiency virus [HIV] disease: Secondary | ICD-10-CM

## 2020-09-18 NOTE — Telephone Encounter (Signed)
LFD 08/15/20 300g with no refills LOV 09/17/20 NOV 10/24/20

## 2020-09-18 NOTE — Telephone Encounter (Signed)
Please advise on refill.

## 2020-09-18 NOTE — Telephone Encounter (Signed)
Called patient after receiving refill request for Zofran. Per MD if patient is still experiencing nausea he will need to follow up with PCP. Left voicemail with the following message. Will send mychart message Leatrice Jewels, RMA

## 2020-09-19 ENCOUNTER — Other Ambulatory Visit: Payer: PPO

## 2020-09-21 ENCOUNTER — Other Ambulatory Visit: Payer: Self-pay | Admitting: Family Medicine

## 2020-09-21 DIAGNOSIS — E1142 Type 2 diabetes mellitus with diabetic polyneuropathy: Secondary | ICD-10-CM

## 2020-09-23 NOTE — Progress Notes (Deleted)
Cardiology Office Note   Date:  09/23/2020   ID:  Daniel [name] 12-11-1955, MRN [id number]  PCP:  [name], MD  Cardiologist:  Dr.Rulo  No chief complaint on file.    History of Present Illness: Daniel [name] is a 65 y.o. male who presents for ongoing assessment and management of recurrent DVT PE on lifelong anticoagulation, history of HIV, OSA on CPAP, and diabetes.  He was first evaluated 02/2015 for dizziness and falls.  The symptoms had been ongoing for several months.  He felt as though the room was spinning when changing position, leaning his head back or laying down.  He was referred for an echo 03/2015 that revealed LVEF 60-65% with mild LVH.  He also had carotid Dopplers 02/2015 that revealed minimal plaque bilaterally.  Additionally, he reported exertional dyspnea so he was referred for [facility] which was negative for ischemia. In the interim Daniel [name] also had nerve conduction studies that revealed peripheral neuropathy consistent with diabetic neuropathy. He was referred for a 30 day event monitor 07/30/2015 that was unremarkable.  Daniel [name] had toes amputated on his R leg.  ABIs were normal 09/2015 prior to his great toe amputation.   Atypical chest pain and had a Lexiscan Myoview 03/2018 that revealed LVEF 59% and no ischemia.  When last seen in the office on 01/04/2020, the patient's main complaint was pain in his knees and he was contemplating knee replacement.  He denied any shortness of breath unless it was significantly exertional as him going up stairs.  He did complain of some mild lower extremity edema at the end of the day noticing a sock imprint.  He is struggling with frequent falls due to his knees.  He is also intolerant to statin therapy due to significant myalgias and transaminitis.  An echocardiogram was repeated along with the BMET to evaluate him with increased dose of Lasix to 40 mg twice a day for 3 days to help with lower extremity edema and  then to go back to her normal dose.  A preoperative assessment was completed and it was okay for him to hold Xarelto for 3 days preoperatively.  For knee replacement surgery.  Echo was completed on 01/31/2020 revealing an EF of 60 to 65% with normal LV function.  Grade 1 diastolic dysfunction, no significant valvular abnormalities.    Past Medical History:  Diagnosis Date   ADHD (attention deficit hyperactivity disorder)    Anxiety    Chronic kidney disease    Clotting disorder ([place])    Depression    Diabetes mellitus without complication ([name])    Diabetes mellitus, type II ([name])    Dizziness 03/17/2015   Essential hypertension 06/25/2018   GERD (gastroesophageal reflux disease)    HIV disease ([name])    HIV infection ([name])    Liver disease    OSA (obstructive sleep apnea) 07/25/2015   Uses CPAP regularly   Peripheral vascular disease ([place])    Ulcer     Past Surgical History:  Procedure Laterality Date   AMPUTATION Right 10/02/2017   Procedure: RIGHT TRANSMETATARSAL AMPUTATION;  Surgeon: Leandrew Koyanagi, MD;  Location: [place];  Service: Orthopedics;  Laterality: Right;   SMALL INTESTINE SURGERY     STOMACH SURGERY     TOE AMPUTATION Right 08/2016   right great toe     Current Outpatient Medications  Medication Sig Dispense Refill   acetaminophen (TYLENOL) 500 MG tablet Take 1,500 mg by mouth in the  morning and at bedtime.     ALPRAZolam (XANAX) 1 MG tablet Take 1 mg by mouth at bedtime. *May take one tablet up to 4 times daily as needed for anxiety     amphetamine-dextroamphetamine (ADDERALL) 30 MG tablet Take 30 mg by mouth 3 (three) times daily.     B-D ULTRAFINE III SHORT PEN 31G X 8 MM MISC USE AS DIRECTED THREE TIMES DAILY 100 each 2   brexpiprazole (REXULTI) 1 MG TABS tablet Take 1 mg by mouth at bedtime.     Continuous Blood Gluc Sensor (FREESTYLE LIBRE 14 DAY SENSOR) MISC USE AS DIRECTED EVERY 14 DAYS 6 each 3   diclofenac Sodium (VOLTAREN) 1 % GEL APPLY 2 GRAMS TO EACH KNEE  IN THE MORNING AND AT BEDTIME(APPLY 1 GRAM TO EACH KNEE IN THE AFTERNOON) 300 g 0   divalproex (DEPAKOTE ER) 500 MG 24 hr tablet TAKE 1 TABLET(500 MG) BY MOUTH AT BEDTIME 90 tablet 0   Dulaglutide (TRULICITY) 3 AL/9.3XT SOPN Inject 3 mg into the skin once a week. 6 mL 3   ezetimibe (ZETIA) 10 MG tablet TAKE 1 TABLET(10 MG) BY MOUTH DAILY 90 tablet 2   Insulin Aspart FlexPen 100 UNIT/ML SOPN INJECT 15 UNITS UNDER THE SKIN WITH BREAKFAST AND 25 UNITS WITH SUPPER 36 mL 0   insulin glargine (LANTUS SOLOSTAR) 100 UNIT/ML Solostar Pen Inject 110 Units into the skin every morning. 75 mL 1   levothyroxine (SYNTHROID, LEVOTHROID) 50 MCG tablet Take 1 tablet (50 mcg total) by mouth at bedtime. 30 tablet 11   ondansetron (ZOFRAN) 8 MG tablet TAKE 1 TABLET(8 MG) BY MOUTH EVERY 8 HOURS AS NEEDED FOR NAUSEA OR VOMITING 20 tablet 1   pantoprazole (PROTONIX) 40 MG tablet TAKE 1 TABLET(40 MG) BY MOUTH DAILY AT 6 AM 90 tablet 0   pregabalin (LYRICA) 150 MG capsule TAKE 2 CAPSULE BY MOUTH TWICE DAILY 360 capsule 0   protriptyline (VIVACTIL) 10 MG tablet Take 10 mg by mouth 3 (three) times daily.   11   TRINTELLIX 20 MG TABS tablet Take 20 mg by mouth at bedtime.      TRIUMEQ 600-50-300 MG tablet TAKE 1 TABLET BY MOUTH DAILY 30 tablet 5   XARELTO 20 MG TABS tablet TAKE 1 TABLET(20 MG) BY MOUTH DAILY WITH SUPPER 90 tablet 0   zolpidem (AMBIEN) 10 MG tablet Take 10 mg by mouth at bedtime.     No current facility-administered medications for this visit.    Allergies:   Aspirin, Ibuprofen, Sustiva [efavirenz], Nsaids, and Lipitor [atorvastatin calcium]    Social History:  The patient  reports that he quit smoking about 6 years ago. His smoking use included cigars. He has a 1.00 pack-year smoking history. He has never used smokeless tobacco. He reports that he does not drink alcohol and does not use drugs.   Family History:  The patient's family history includes COPD in his mother; Depression in his brother; Throat  cancer in his brother.    ROS: All other systems are reviewed and negative. Unless otherwise mentioned in H&P    PHYSICAL EXAM: VS:  There were no vitals taken for this visit. , BMI There is no height or weight on file to calculate BMI. GEN: Well nourished, well developed, in no acute distress HEENT: normal Neck: no JVD, carotid bruits, or masses Cardiac: ***RRR; no murmurs, rubs, or gallops,no edema  Respiratory:  Clear to auscultation bilaterally, normal work of breathing GI: soft, nontender, nondistended, + BS MS:  no deformity or atrophy Skin: warm and dry, no rash Neuro:  Strength and sensation are intact Psych: euthymic mood, full affect   EKG:  EKG {ACTION; IS/IS JAS:[id number]} ordered today. The ekg ordered today demonstrates ***   Recent Labs: 01/04/2020: BNP 8.7 05/07/2020: BUN 20; Creatinine, Ser 1.54; Potassium 4.4; Sodium 139; TSH 0.47 09/17/2020: Hemoglobin 17.2; Platelets 146.0    Lipid Panel    Component Value Date/Time   CHOL 239 (H) 05/19/2019 1719   TRIG 241 (H) 05/19/2019 1719   HDL 40 05/19/2019 1719   CHOLHDL 6.0 (H) 05/19/2019 1719   CHOLHDL 3.3 08/09/2015 0956   VLDL 22 08/09/2015 0956   LDLCALC 155 (H) 05/19/2019 1719      Wt Readings from Last 3 Encounters:  09/17/20 250 lb 12.8 oz (113.8 kg)  08/29/20 250 lb 3.2 oz (113.5 kg)  05/07/20 255 lb 12.8 oz (116 kg)      Other studies Reviewed: Echocardiogram Feb 14, 2020 Left ventricular ejection fraction, by estimation, is 60 to 65%. The  left ventricle has normal function. The left ventricle has no regional  wall motion abnormalities. There is mild left ventricular hypertrophy.  Left ventricular diastolic parameters  are consistent with Grade I diastolic dysfunction (impaired relaxation).   2. Right ventricular systolic function is normal. The right ventricular  size is normal.   3. The mitral valve is grossly normal. No evidence of mitral valve  regurgitation.   4. The aortic valve was not  well visualized. Aortic valve regurgitation  is not visualized.   5. Aortic dilatation noted. There is borderline dilatation of the aortic  root, measuring 39 mm.   6. The inferior vena cava is normal in size with greater than 50%  respiratory variability, suggesting right atrial pressure of 3 mmHg.   NM Stress Test 04/02/2018 Study Highlights  Normal perfusion NO ischemia or scar Nuclear stress EF: 59%. There was no ST segment deviation noted during stress. The study is normal. This is a low risk study.   ASSESSMENT AND PLAN:  1.  ***   Current medicines are reviewed at length with the patient today.  I have spent *** dedicated to the care of this patient on the date of this encounter to include pre-visit review of records, assessment, management and diagnostic testing,with shared decision making.  Labs/ tests ordered today include: *** Phill Myron. [place] Pugh, ANP, AACC   09/23/2020 12:05 PM    [facility] [address] Office [phone] Fax [phone]  Notice: This dictation was prepared with Dragon dictation along with smaller phrase technology. Any transcriptional errors that result from this process are unintentional and may not be corrected upon review.

## 2020-09-24 NOTE — Telephone Encounter (Signed)
LFD 04/12/20 #360 with no refills LOV 09/17/20 NOV 10/24/20

## 2020-09-25 NOTE — Telephone Encounter (Signed)
Medication discussed at recent office visit, refilled.

## 2020-09-28 ENCOUNTER — Ambulatory Visit: Payer: PPO | Admitting: Adult Health

## 2020-10-08 ENCOUNTER — Ambulatory Visit (INDEPENDENT_AMBULATORY_CARE_PROVIDER_SITE_OTHER): Payer: PPO | Admitting: Neurology

## 2020-10-08 ENCOUNTER — Other Ambulatory Visit: Payer: Self-pay

## 2020-10-08 ENCOUNTER — Ambulatory Visit (INDEPENDENT_AMBULATORY_CARE_PROVIDER_SITE_OTHER): Payer: PPO | Admitting: Endocrinology

## 2020-10-08 VITALS — BP 140/74 | HR 97 | Ht 70.0 in | Wt 255.6 lb

## 2020-10-08 DIAGNOSIS — E1122 Type 2 diabetes mellitus with diabetic chronic kidney disease: Secondary | ICD-10-CM

## 2020-10-08 DIAGNOSIS — G4733 Obstructive sleep apnea (adult) (pediatric): Secondary | ICD-10-CM | POA: Diagnosis not present

## 2020-10-08 DIAGNOSIS — N1831 Chronic kidney disease, stage 3a: Secondary | ICD-10-CM

## 2020-10-08 DIAGNOSIS — Z6836 Body mass index (BMI) 36.0-36.9, adult: Secondary | ICD-10-CM | POA: Diagnosis not present

## 2020-10-08 DIAGNOSIS — E6609 Other obesity due to excess calories: Secondary | ICD-10-CM

## 2020-10-08 DIAGNOSIS — Z794 Long term (current) use of insulin: Secondary | ICD-10-CM

## 2020-10-08 LAB — POCT GLYCOSYLATED HEMOGLOBIN (HGB A1C): Hemoglobin A1C: 7.5 % — AB (ref 4.0–5.6)

## 2020-10-08 MED ORDER — TRULICITY 4.5 MG/0.5ML ~~LOC~~ SOAJ: 4.5000 mg | SUBCUTANEOUS | 3 refills | Status: DC

## 2020-10-08 MED ORDER — LANTUS SOLOSTAR 100 UNIT/ML ~~LOC~~ SOPN: 100.0000 [IU] | PEN_INJECTOR | SUBCUTANEOUS | 3 refills | Status: DC

## 2020-10-08 MED ORDER — CANAGLIFLOZIN 300 MG PO TABS
300.0000 mg | ORAL_TABLET | Freq: Every day | ORAL | 3 refills | Status: DC
Start: 2020-10-08 — End: 2021-04-18

## 2020-10-08 NOTE — Progress Notes (Signed)
Subjective:    Patient ID: Daniel [name], male    DOB: 12/15/1955, 65 y.o.   MRN: [id number]  HPI Pt returns for f/u of diabetes mellitus:  DM type: Insulin-requiring type 2.  Dx'ed: 5366 Complications: PN, PAD, CRI, and foot ulcers.   Therapy: insulin since 4403, Trulicity, and Invokana.   DKA: never Severe hypoglycemia: never.  Pancreatitis: never.   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   Other: he takes multiple daily injections, but basal insulin is emphasized, with improved results; he is retired; he eats meals at [employer] and 1AM  Interval history: no cbg record, but states cbg varies from 63-350.  It is in general higher as the day goes on.   He also has chronic primary hypothyroidism.  He takes synthroid as rx'ed.   Past Medical History:  Diagnosis Date   ADHD (attention deficit hyperactivity disorder)    Anxiety    Chronic kidney disease    Clotting disorder ([place])    Depression    Diabetes mellitus without complication ([place])    Diabetes mellitus, type II ([place])    Dizziness 03/17/2015   Essential hypertension 06/25/2018   GERD (gastroesophageal reflux disease)    HIV disease ([name])    HIV infection ([name])    Liver disease    OSA (obstructive sleep apnea) 07/25/2015   Uses CPAP regularly   Peripheral vascular disease ([place])    Ulcer     Past Surgical History:  Procedure Laterality Date   AMPUTATION Right 10/02/2017   Procedure: RIGHT TRANSMETATARSAL AMPUTATION;  Surgeon: Leandrew Koyanagi, MD;  Location: [place];  Service: Orthopedics;  Laterality: Right;   SMALL INTESTINE SURGERY     STOMACH SURGERY     TOE AMPUTATION Right 08/2016   right great toe    Social History   Socioeconomic History   Marital status: Single    Spouse name: Not on file   Number of children: Not on file   Years of education: Not on file   Highest education level: Not on file  Occupational History    Comment: DISABILITY  Tobacco Use   Smoking status:  Former    Packs/day: 0.10    Years: 10.00    Pack years: 1.00    Types: Cigars, Cigarettes    Quit date: 08/09/2014    Years since quitting: 6.1   Smokeless tobacco: Never  Vaping Use   Vaping Use: Never used  Substance and Sexual Activity   Alcohol use: No    Alcohol/week: 0.0 standard drinks   Drug use: No   Sexual activity: Not Currently    Partners: Male    Comment: pt. declined condoms  Other Topics Concern   Not on file  Social History Narrative   Epworth Sleepiness Scale = 7 (as of 03/16/2015)   Social Determinants of Health   Financial Resource Strain: Not on file  Food Insecurity: Not on file  Transportation Needs: Not on file  Physical Activity: Not on file  Stress: Not on file  Social Connections: Not on file  Intimate Partner Violence: Not on file    Current Outpatient Medications on File Prior to Visit  Medication Sig Dispense Refill   acetaminophen (TYLENOL) 500 MG tablet Take 1,500 mg by mouth in the morning and at bedtime.     ALPRAZolam (XANAX) 1 MG tablet Take 1 mg by mouth at bedtime. *May take one tablet up to 4 times daily as  needed for anxiety     amphetamine-dextroamphetamine (ADDERALL) 30 MG tablet Take 30 mg by mouth 3 (three) times daily.     B-D ULTRAFINE III SHORT PEN 31G X 8 MM MISC USE AS DIRECTED THREE TIMES DAILY 100 each 2   brexpiprazole (REXULTI) 1 MG TABS tablet Take 1 mg by mouth at bedtime.     Continuous Blood Gluc Sensor (FREESTYLE LIBRE 14 DAY SENSOR) MISC USE AS DIRECTED EVERY 14 DAYS 6 each 3   diclofenac Sodium (VOLTAREN) 1 % GEL APPLY 2 GRAMS TO EACH KNEE IN THE MORNING AND AT BEDTIME(APPLY 1 GRAM TO EACH KNEE IN THE AFTERNOON) 300 g 0   divalproex (DEPAKOTE ER) 500 MG 24 hr tablet TAKE 1 TABLET(500 MG) BY MOUTH AT BEDTIME 90 tablet 0   ezetimibe (ZETIA) 10 MG tablet TAKE 1 TABLET(10 MG) BY MOUTH DAILY 90 tablet 2   Insulin Aspart FlexPen 100 UNIT/ML SOPN INJECT 15 UNITS UNDER THE SKIN WITH BREAKFAST AND 25 UNITS WITH SUPPER 36 mL  0   levothyroxine (SYNTHROID, LEVOTHROID) 50 MCG tablet Take 1 tablet (50 mcg total) by mouth at bedtime. 30 tablet 11   ondansetron (ZOFRAN) 8 MG tablet TAKE 1 TABLET(8 MG) BY MOUTH EVERY 8 HOURS AS NEEDED FOR NAUSEA OR VOMITING 20 tablet 1   pantoprazole (PROTONIX) 40 MG tablet TAKE 1 TABLET(40 MG) BY MOUTH DAILY AT 6 AM 90 tablet 0   pregabalin (LYRICA) 150 MG capsule TAKE 2 CAPSULES BY MOUTH TWICE DAILY 120 capsule 0   protriptyline (VIVACTIL) 10 MG tablet Take 10 mg by mouth 3 (three) times daily.   11   TRINTELLIX 20 MG TABS tablet Take 20 mg by mouth at bedtime.      TRIUMEQ 600-50-300 MG tablet TAKE 1 TABLET BY MOUTH DAILY 30 tablet 5   XARELTO 20 MG TABS tablet TAKE 1 TABLET(20 MG) BY MOUTH DAILY WITH SUPPER 90 tablet 0   zolpidem (AMBIEN) 10 MG tablet Take 10 mg by mouth at bedtime.     No current facility-administered medications on file prior to visit.    Allergies  Allergen Reactions   Aspirin Swelling   Ibuprofen Swelling   Sustiva [Efavirenz] Swelling and Rash   Nsaids Other (See Comments)    unknwn   Lipitor [Atorvastatin Calcium] Other (See Comments)    Leg pain    Family History  Problem Relation Age of Onset   Depression Brother    Throat cancer Brother        half brother, never smoker   COPD Mother    Diabetes Neg Hx     BP 140/74 (BP Location: Right Arm, Patient Position: Sitting, Cuff Size: Large)   Pulse 97   Ht 5' 10"  (1.778 m)   Wt 255 lb 9.6 oz (115.9 kg)   SpO2 96%   BMI 36.67 kg/m    Review of Systems     Objective:   Physical Exam VITAL SIGNS:  See vs page GENERAL: no distress Pulses: left foot pulses are intact.    MSK: no deformity of the feet or ankles, except for right TMA.     CV: 2+ bilat edema of the legs.   Skin: normal color and temp on the feet and ankles.  No erythema.  No ulcer, but there are bilat heavy calluses.  Neuro: sensation is intact to touch on the feet and ankles, but severely decreased from normal.   There is  onychomycosis of the left foot toenails.   Lab Results  Component Value Date   TSH 0.47 05/07/2020   Lab Results  Component Value Date   CREATININE 1.54 (H) 05/07/2020   BUN 20 05/07/2020   NA 139 05/07/2020   K 4.4 05/07/2020   CL 103 05/07/2020   CO2 28 05/07/2020    A1c=7.5%     Assessment & Plan:  Insulin-requiring type 2 DM: uncontrolled.   Hypoglycemia, due to insulin: We'll favor GLP rx over insulin.   Patient Instructions  Please reduce the Lantus to 100 units each morning, and:   I have sent a prescription to your pharmacy, to increase the Trulicity  continue the same Invokana.  check your blood sugar twice a day.  vary the time of day when you check, between before the 3 meals, and at bedtime.  also check if you have symptoms of your blood sugar being too high or too low.  please keep a record of the readings and bring it to your next appointment here (or you can bring the meter itself).  You can write it on any piece of paper.  please call us sooner if your blood sugar goes below 70, or if you have a lot of readings over 200.   Please come back for a follow-up appointment in 2-3 months.

## 2020-10-08 NOTE — Patient Instructions (Addendum)
Please reduce the Lantus to 100 units each morning, and:   I have sent a prescription to your pharmacy, to increase the Trulicity  continue the same Invokana.  check your blood sugar twice a day.  vary the time of day when you check, between before the 3 meals, and at bedtime.  also check if you have symptoms of your blood sugar being too high or too low.  please keep a record of the readings and bring it to your next appointment here (or you can bring the meter itself).  You can write it on any piece of paper.  please call us sooner if your blood sugar goes below 70, or if you have a lot of readings over 200.   Please come back for a follow-up appointment in 2-3 months.

## 2020-10-09 ENCOUNTER — Other Ambulatory Visit: Payer: Self-pay | Admitting: Family Medicine

## 2020-10-09 ENCOUNTER — Other Ambulatory Visit: Payer: Self-pay | Admitting: Endocrinology

## 2020-10-09 ENCOUNTER — Other Ambulatory Visit: Payer: Self-pay | Admitting: Internal Medicine

## 2020-10-09 DIAGNOSIS — Z794 Long term (current) use of insulin: Secondary | ICD-10-CM

## 2020-10-09 DIAGNOSIS — G8929 Other chronic pain: Secondary | ICD-10-CM

## 2020-10-09 DIAGNOSIS — B20 Human immunodeficiency virus [HIV] disease: Secondary | ICD-10-CM

## 2020-10-09 DIAGNOSIS — E0849 Diabetes mellitus due to underlying condition with other diabetic neurological complication: Secondary | ICD-10-CM

## 2020-10-09 DIAGNOSIS — M25561 Pain in right knee: Secondary | ICD-10-CM

## 2020-10-09 DIAGNOSIS — I824Y9 Acute embolism and thrombosis of unspecified deep veins of unspecified proximal lower extremity: Secondary | ICD-10-CM

## 2020-10-09 DIAGNOSIS — I10 Essential (primary) hypertension: Secondary | ICD-10-CM

## 2020-10-09 NOTE — Telephone Encounter (Signed)
LFD 09/19/20 300g with no refills LOV 09/17/20 NOV 10/24/20

## 2020-10-15 NOTE — Progress Notes (Signed)
[facility] TEST REPORT ( by Watch PAT)   STUDY DATE:  10-08-2020 DOB:  1955-06-05 MRN: [id number]   ORDERING CLINICIAN: Larey Seat, MD  REFERRING CLINICIAN: Raynelle Dick, NP via Dr Carlota Raspberry, MD   CLINICAL INFORMATION/HISTORY: 08-24-2019,  I have the pleasure of meeting again with Daniel [name], a 65 year old caucasian gentleman, who has been referred for migraine treatment to [facility]  followed by [name] and in 2017 became a sleep apnea patient of mine.  Current CPAP user, AHI of 1.8/h om 6-16 cm water auto-CPAP, 87% compliance by days, 47 % by hours. 95% 13.4 cm water pressure.  Leaks 26.8 L/min . Here for new machine.    Epworth sleepiness score: 8/24.   BMI: 36kg/m   Neck Circumference: 17"   FINDINGS:   Sleep Summary:   Total Recording Time (hours, min): 8 h, 5 min       Total Sleep Time (hours, min): 6 h, 42 min.                Percent REM (%): 10.6%                                       Respiratory Indices:   Calculated pAHI (per hour): Calculated apnea-hypopnea index was 40.1 with a much stronger non-REM sleep proportion than REM sleep proportion.  REM sleep AHI was 11.1/h only.  10% of the apneas were rated as being Cheyne-Stokes respiration or central in origin.  Supine AHI was 45.6/h.                                                                  Oxygen Saturation Statistics:  O2 Saturation Range (%): Oxygenation varied between a nadir of 81% and a maximum 100% with a mean oxygenation or saturation at 95%.                                      O2 Saturation (minutes) <89%: Was 2 minutes or 0.5% of total sleep time.         Pulse Rate Statistics:    Pulse Range: Varied between 101 bpm at maximum and the lowest heart rate of 60 bpm.  Mean heart rate was 75 bpm.  Please note that this test is not equipped to give cardiac rhythm data- only the heart rate is reported.                IMPRESSION:  This HST confirms the  presence of severe sleep apnea of a complex nature, this patient may well have more central apneas in the home sleep test reveals.  However he has done excellently with the use of auto CPAP and therefore central emergent sleep apnea is not a likely finding.  RECOMMENDATION: I would like for the patient to continue auto CPAP with a minimum pressure of 6 and maximum pressure of 16 to centimeter EPR and a mask of his choice.  Please note that facial hair may be reason for the poor air seal.  INTERPRETING PHYSICIAN:Thickened and fibrous The food in the room the bottom

## 2020-10-17 NOTE — Procedures (Signed)
Larey Seat, MD   Medical Director of [facility].

## 2020-10-19 ENCOUNTER — Telehealth: Payer: Self-pay | Admitting: Endocrinology

## 2020-10-19 NOTE — Telephone Encounter (Signed)
Amber from [employer] is calling on behalf of patient stating that pt needs a refill on   insulin glargine (LANTUS SOLOSTAR) 100 UNIT/ML Risk analyst [place] [phone]

## 2020-10-23 DIAGNOSIS — Z789 Other specified health status: Secondary | ICD-10-CM

## 2020-10-23 DIAGNOSIS — T466X5A Adverse effect of antihyperlipidemic and antiarteriosclerotic drugs, initial encounter: Secondary | ICD-10-CM

## 2020-10-23 NOTE — Progress Notes (Signed)
[facility])                                            [place] Team                                        Statin Quality Measure Assessment    10/23/2020  Arren Laminack [facility] 09/13/1955 [id number]  Per review of chart and payor information, this patient has been flagged for non-adherence to the following CMS Quality Measure:   [x]  Statin Use in Persons with Diabetes  []  Statin Use in Persons with Cardiovascular Disease  The 10-year ASCVD risk score Mikey Bussing DC Brooke Bonito., et al., 2013) is: 41.6%   Values used to calculate the score:     Age: 65 years     Sex: Male     Is Non-Hispanic African American: No     Diabetic: Yes     Tobacco smoker: No     Systolic Blood Pressure: 801 mmHg     Is BP treated: Yes     HDL Cholesterol: 40 mg/dL     Total Cholesterol: 239 mg/dL LDL 155 mg/dL 05/19/2019  Currently prescribed statin:  []  Yes [x]  No     Comments: N/A  History of statin use:            [x]  Yes []  No   Comments: Prior documentation indicated atorvastatin & pravastatin caused elevated transaminases and leg pain. Per cardiology note on 01/04/2020, atorvastatin was stopped 2/2 to myalgias. Prescribed Zetia.    Please consider ONE of the following recommendations:   Code for past statin intolerance or other exclusions (required annually)  Drug Induced Myopathy G72.0   Myositis, unspecified M60.9   Rhabdomyolysis M62.82   Prediabetes R73.03   Adverse effect of antihyperlipidemic and antiarteriosclerotic drugs, initial encounter K55.3Z4M    Thank you for your time,  Kristeen Miss, [place] Cell: [phone]

## 2020-10-24 ENCOUNTER — Ambulatory Visit: Payer: PPO | Admitting: Family Medicine

## 2020-11-16 ENCOUNTER — Telehealth: Payer: Self-pay | Admitting: Family Medicine

## 2020-11-16 ENCOUNTER — Other Ambulatory Visit: Payer: Self-pay | Admitting: Family Medicine

## 2020-11-16 DIAGNOSIS — Z8669 Personal history of other diseases of the nervous system and sense organs: Secondary | ICD-10-CM

## 2020-11-16 DIAGNOSIS — M25561 Pain in right knee: Secondary | ICD-10-CM

## 2020-11-16 DIAGNOSIS — G8929 Other chronic pain: Secondary | ICD-10-CM

## 2020-11-16 NOTE — Chronic Care Management (AMB) (Signed)
  Chronic Care Management   Outreach Note  11/16/2020 Name: Daniel [name] MRN: [id number] DOB: [birth year]  Referred by: [name], MD Reason for referral : No chief complaint on file.   An unsuccessful telephone outreach was attempted today. The patient was referred to the pharmacist for assistance with care management and care coordination.   Follow Up Plan:   Tatjana Dellinger Upstream Scheduler

## 2020-11-20 ENCOUNTER — Other Ambulatory Visit: Payer: Self-pay

## 2020-11-20 ENCOUNTER — Telehealth: Payer: PPO | Admitting: Registered Nurse

## 2020-11-22 ENCOUNTER — Telehealth: Payer: Self-pay | Admitting: Family Medicine

## 2020-11-22 NOTE — Progress Notes (Signed)
  Chronic Care Management   Outreach Note  11/22/2020 Name: JOSEPHUS HARRIGER MRN: [id number] DOB: Sep 26, 1955  Referred by: [name], MD Reason for referral : No chief complaint on file.   A second unsuccessful telephone outreach was attempted today. The patient was referred to pharmacist for assistance with care management and care coordination.  Follow Up Plan:   Tatjana Dellinger Upstream Scheduler

## 2020-11-26 ENCOUNTER — Telehealth: Payer: Self-pay | Admitting: Family Medicine

## 2020-11-26 NOTE — Progress Notes (Signed)
  Chronic Care Management   Note  11/26/2020 Name: Daniel [name] MRN: [id number] DOB: 01/15/1956  Daniel [name] is a 65 y.o. year old male who is a primary care patient of Daniel Agreste, MD. I reached out to Daniel [name] by phone today in response to a referral sent by Daniel [name]'s PCP, Daniel Agreste, MD.   Daniel [name] was given information about Chronic Care Management services today including:  CCM service includes personalized support from designated clinical staff supervised by his physician, including individualized plan of care and coordination with other care providers 24/7 contact phone numbers for assistance for urgent and routine care needs. Service will only be billed when office clinical staff spend 20 minutes or more in a month to coordinate care. Only one practitioner may furnish and bill the service in a calendar month. The patient may stop CCM services at any time (effective at the end of the month) by phone call to the office staff.   Patient agreed to services and verbal consent obtained.   Follow up plan:   Daniel [name]

## 2020-11-27 NOTE — Progress Notes (Deleted)
Cardiology Office Note:    Date:  11/27/2020   ID:  Daniel [name], DOB 06/22/1955, MRN [id number]  PCP:  [name], MD  Cardiologist:  Skeet Latch, MD  Electrophysiologist:  None   Referring MD: [name], MD   Chief Complaint: routine follow-up of lower extremity edema and shortness of breath  History of Present Illness:    Daniel [name] is a 65 y.o. male with a history of atypical chest pain with negative Myoview in 03/2018 with resolution of pain, dizziness felt to be secondary to vertigo, recurrent DVT/PE on lifelong anticoagulation, right foot osteomyelitis s/p right great toe amputation in 08/2016 and right transmetatarsal amputation in 09/2017, hypertension, hyperlipidemia, type 2 diabetes mellitus, obstructive sleep apnea on CPAP, GERD, CKD stage III, and HIV who is followed by Dr. [name] and presents today for routine follow-up.   Patient was initially referred to Dr. [name] in 02/2015 for evaluation of dizziness that he described as the "room spinning" with recurrent falls. Dizziness was felt to be related to BPPV. Orthostatics were negative. Echo and carotid ultrasound were ordered for further evaluation. Echo showed LVEF of 60-65% with normal wall motion and normal diastolic function. Carotid ultrasound showed trace plaque in distal left common carotid artery but no evidence of internal carotid disease. Myoview was later ordered in 2017 for further evaluation of shortness of breath and this was low risk with no evidence of ischemia. Event monitor was placed in 06/2015 for further evaluation of syncope and was unremarkable. Repeat Myoview was ordered in 03/2018 for further evaluation of atypical chest pain and was low risk with no evidence of ischemia.   Patient was last seen by Dr. [name] in 12/2019 at which time he was having a lot of knee pain and had upcoming right knee replacement planned. His activity was limited by his knees but he denied any chest pain. He  did report some shortness of breath with activity and he had some lower extremity edema on exam. Therefore, repeat  Echo was ordered and showed LVEF of 60-65% with normal wall motion, mild LVH, and grade 1 diastolic dysfunction. Lasix was increased for 3 days.  Patient presents today for follow-up. ***  Dyspnea On Exertion Chronic Lower Extremity Edema - Last Echo in 01/2020 showed LVEF of 60-65% with normal wall motion, mild LVH, and grade 1 diastolic dysfunction. - Stable.   History of Atypical Chest Pain - Myoview in 03/2018 was low risk with no evidence of ischemia.  - No recurrent chest pain. - No additional work-up necessary.  Recurrent DVT/PE - On lifelong Xarelto 56m daily.  Hypertension - Hypertension listed in chart but not on any medications.  - ***  Hyperlipidemia - Last lipid panel in *** - Intolerant to Lipitor in the past due to myalgias. Other statins stopped due to transaminitis. Continue Zetia 121mdaily.  Type 2 Diabetes Mellitus - Hemoglobin A1c 7.5 in 09/2020. - Management per PCP/Endocrinology.   Past Medical History:  Diagnosis Date   ADHD (attention deficit hyperactivity disorder)    Anxiety    Chronic kidney disease    Clotting disorder ([facility]   Depression    Diabetes mellitus without complication ([facility]   Diabetes mellitus, type II ([facility]   Dizziness 03/17/2015   Essential hypertension 06/25/2018   GERD (gastroesophageal reflux disease)    HIV disease ([name])    HIV infection ([facility]   Liver disease    OSA (obstructive sleep apnea) 07/25/2015  Uses CPAP regularly   Peripheral vascular disease ([place])    Ulcer     Past Surgical History:  Procedure Laterality Date   AMPUTATION Right 10/02/2017   Procedure: RIGHT TRANSMETATARSAL AMPUTATION;  Surgeon: Leandrew Koyanagi, MD;  Location: [facility];  Service: Orthopedics;  Laterality: Right;   SMALL INTESTINE SURGERY     STOMACH SURGERY     TOE AMPUTATION Right 08/2016   right great toe    Current  Medications: No outpatient medications have been marked as taking for the 11/29/20 encounter (Appointment) with Darreld Mclean, PA-C.     Allergies:   Aspirin, Ibuprofen, Sustiva [efavirenz], Nsaids, and Lipitor [atorvastatin calcium]   Social History   Socioeconomic History   Marital status: Single    Spouse name: Not on file   Number of children: Not on file   Years of education: Not on file   Highest education level: Not on file  Occupational History    Comment: DISABILITY  Tobacco Use   Smoking status: Former    Packs/day: 0.10    Years: 10.00    Pack years: 1.00    Types: Cigars, Cigarettes    Quit date: 08/09/2014    Years since quitting: 6.3   Smokeless tobacco: Never  Vaping Use   Vaping Use: Never used  Substance and Sexual Activity   Alcohol use: No    Alcohol/week: 0.0 standard drinks   Drug use: No   Sexual activity: Not Currently    Partners: Male    Comment: pt. declined condoms  Other Topics Concern   Not on file  Social History Narrative   Epworth Sleepiness Scale = 7 (as of 03/16/2015)   Social Determinants of Health   Financial Resource Strain: Not on file  Food Insecurity: Not on file  Transportation Needs: Not on file  Physical Activity: Not on file  Stress: Not on file  Social Connections: Not on file     Family History: The patient's family history includes COPD in his mother; Depression in his brother; Throat cancer in his brother. There is no history of Diabetes.  ROS:   Please see the history of present illness.     EKGs/Labs/Other Studies Reviewed:    The following studies were reviewed today:  Myoview 04/02/2018: Normal perfusion NO ischemia or scar Nuclear stress EF: 59%. There was no ST segment deviation noted during stress. The study is normal. This is a low risk study. _______________  Echocardiogram 01/31/2020: Impressions: 1. Left ventricular ejection fraction, by estimation, is 60 to 65%. The  left ventricle has  normal function. The left ventricle has no regional  wall motion abnormalities. There is mild left ventricular hypertrophy.  Left ventricular diastolic parameters  are consistent with Grade I diastolic dysfunction (impaired relaxation).   2. Right ventricular systolic function is normal. The right ventricular  size is normal.   3. The mitral valve is grossly normal. No evidence of mitral valve  regurgitation.   4. The aortic valve was not well visualized. Aortic valve regurgitation  is not visualized.   5. Aortic dilatation noted. There is borderline dilatation of the aortic  root, measuring 39 mm.   6. The inferior vena cava is normal in size with greater than 50%  respiratory variability, suggesting right atrial pressure of 3 mmHg.   Comparison(s): A prior study was performed on 04/05/2015. No significant  change from prior study.   EKG:  EKG ordered today. EKG personally reviewed and demonstrates ***.  Recent Labs:  01/04/2020: BNP 8.7 05/07/2020: BUN 20; Creatinine, Ser 1.54; Potassium 4.4; Sodium 139; TSH 0.47 09/17/2020: Hemoglobin 17.2; Platelets 146.0  Recent Lipid Panel    Component Value Date/Time   CHOL 239 (H) 05/19/2019 1719   TRIG 241 (H) 05/19/2019 1719   HDL 40 05/19/2019 1719   CHOLHDL 6.0 (H) 05/19/2019 1719   CHOLHDL 3.3 08/09/2015 0956   VLDL 22 08/09/2015 0956   LDLCALC 155 (H) 05/19/2019 1719    Physical Exam:    Vital Signs: There were no vitals taken for this visit.    Wt Readings from Last 3 Encounters:  10/08/20 255 lb 9.6 oz (115.9 kg)  09/17/20 250 lb 12.8 oz (113.8 kg)  08/29/20 250 lb 3.2 oz (113.5 kg)     General: 66 y.o. male in no acute distress. HEENT: Normocephalic and atraumatic. Sclera clear. EOMs intact. Neck: Supple. No carotid bruits. No JVD. Heart: *** RRR. Distinct S1 and S2. No murmurs, gallops, or rubs. Radial and distal pedal pulses 2+ and equal bilaterally. Lungs: No increased work of breathing. Clear to ausculation  bilaterally. No wheezes, rhonchi, or rales.  Abdomen: Soft, non-distended, and non-tender to palpation. Bowel sounds present in all 4 quadrants.  MSK: Normal strength and tone for age. *** Extremities: No lower extremity edema.    Skin: Warm and dry. Neuro: Alert and oriented x3. No focal deficits. Psych: Normal affect. Responds appropriately.   Assessment:    No diagnosis found.  Plan:     Disposition: Follow up in ***   Medication Adjustments/Labs and Tests Ordered: Current medicines are reviewed at length with the patient today.  Concerns regarding medicines are outlined above.  No orders of the defined types were placed in this encounter.  No orders of the defined types were placed in this encounter.   There are no Patient Instructions on file for this visit.   Signed, Darreld Mclean, PA-C  11/27/2020 8:52 AM    [facility]

## 2020-11-29 ENCOUNTER — Ambulatory Visit: Payer: PPO | Admitting: Student

## 2020-11-29 ENCOUNTER — Ambulatory Visit: Payer: PPO | Admitting: Neurology

## 2020-11-29 ENCOUNTER — Encounter: Payer: Self-pay | Admitting: Neurology

## 2020-12-04 ENCOUNTER — Other Ambulatory Visit: Payer: Self-pay | Admitting: Neurology

## 2020-12-04 DIAGNOSIS — G4733 Obstructive sleep apnea (adult) (pediatric): Secondary | ICD-10-CM

## 2020-12-05 ENCOUNTER — Other Ambulatory Visit: Payer: Self-pay

## 2020-12-05 ENCOUNTER — Ambulatory Visit (INDEPENDENT_AMBULATORY_CARE_PROVIDER_SITE_OTHER): Payer: PPO | Admitting: Family Medicine

## 2020-12-05 VITALS — BP 138/80 | HR 86 | Temp 98.0°F | Ht 70.0 in | Wt 258.6 lb

## 2020-12-05 DIAGNOSIS — Z23 Encounter for immunization: Secondary | ICD-10-CM | POA: Diagnosis not present

## 2020-12-05 DIAGNOSIS — Z794 Long term (current) use of insulin: Secondary | ICD-10-CM

## 2020-12-05 DIAGNOSIS — K219 Gastro-esophageal reflux disease without esophagitis: Secondary | ICD-10-CM | POA: Diagnosis not present

## 2020-12-05 DIAGNOSIS — G8929 Other chronic pain: Secondary | ICD-10-CM | POA: Diagnosis not present

## 2020-12-05 DIAGNOSIS — Z9181 History of falling: Secondary | ICD-10-CM | POA: Diagnosis not present

## 2020-12-05 DIAGNOSIS — R319 Hematuria, unspecified: Secondary | ICD-10-CM | POA: Diagnosis not present

## 2020-12-05 DIAGNOSIS — N183 Chronic kidney disease, stage 3 unspecified: Secondary | ICD-10-CM | POA: Diagnosis not present

## 2020-12-05 DIAGNOSIS — I824Y9 Acute embolism and thrombosis of unspecified deep veins of unspecified proximal lower extremity: Secondary | ICD-10-CM | POA: Diagnosis not present

## 2020-12-05 DIAGNOSIS — E1122 Type 2 diabetes mellitus with diabetic chronic kidney disease: Secondary | ICD-10-CM

## 2020-12-05 DIAGNOSIS — E1142 Type 2 diabetes mellitus with diabetic polyneuropathy: Secondary | ICD-10-CM

## 2020-12-05 DIAGNOSIS — R3911 Hesitancy of micturition: Secondary | ICD-10-CM

## 2020-12-05 DIAGNOSIS — M545 Low back pain, unspecified: Secondary | ICD-10-CM

## 2020-12-05 LAB — POCT URINALYSIS DIP (MANUAL ENTRY)
Bilirubin, UA: NEGATIVE
Glucose, UA: >=1000 mg/dL — AB
Leukocytes, UA: NEGATIVE
Nitrite, UA: NEGATIVE
Spec Grav, UA: >=1.03 — AB (ref 1.010–1.025)
Urobilinogen, UA: 0.2 E.U./dL
pH, UA: 5 (ref 5.0–8.0)

## 2020-12-05 MED ORDER — RIVAROXABAN 20 MG PO TABS: ORAL_TABLET | ORAL | 0 refills | Status: DC

## 2020-12-05 MED ORDER — PANTOPRAZOLE SODIUM 40 MG PO TBEC: 40.0000 mg | DELAYED_RELEASE_TABLET | Freq: Every day | ORAL | 1 refills | Status: DC

## 2020-12-05 NOTE — Progress Notes (Signed)
Subjective:  Patient ID: Daniel [name], male    DOB: 1955/12/16  Age: 66 y.o. MRN: [id number]  CC:  Chief Complaint  Patient presents with   Back Pain    Pt reports lower right back pain from fall several weeks ago, comes and goes pain wise    Diabetes    Due for micro urine, notes he doesn't do what he is meant to for his DM , glucose 281 at this time, cardiology noted blood and protein in urine.    Hypertension    Pt reports he has had some high readings recently     HPI Daniel [name] presents for  Follow-up.  Last visit with me was July 25, multiple concerns discussed at that time.  I requested a follow-up visit in 3 weeks. He was a no-show for his appointment scheduled with me on August 31, and left without being seen on his appointment September 27 with Maximiano Coss, NP.  Diabetes complicated by hyperglycemia, obesity.  Followed by endocrinology, [name] - last visit August 15 noted, Lantus was decreased to 100 units each morning given history of hypoglycemia., Trulicity increased, continued same dose of Invokana.  Follow-up appointment November 16. Due for urine microalbumin today.   History of falls, now with right low back pain. Falls were discussed at his last visit in July, possible contributors including neuropathy, balance issue with previous toe amputation and medication side effects.  Fall precautions at home were discussed including using assistive device.  He does have a cane and walker at home.  He is refused physical therapy.  He was evaluated for low back pain after previous fall with chronic low back pain.  Continued on Tylenol, Lyrica.  Ridging ordered on July 25 indicated mild multilevel lumbar spine DDD worst at L1-2 progressed compared to the September 2021 examination but no acute findings.  Rib series at that time also negative for apparent acute rib fracture.  Imaging thought to be associated with the superimposed costal cartilage, no apparent displaced  right-sided rib fractures at that time.  Pelvis x-ray also negative for acute fracture.  No falls since last visit. Not using walker or cane. Does not think he needs it. Still off balance and unsteady at times. Has grab bars in bathroom.  Would agree to physical therapy for walking and balance at this time as well as his back pain.   Tylenol 3 pills in am and bedtime helps. Neuropathy controlled with Lyrica 350m BID.   GERD:  Stable with use of protonix daily.   On chronic anticoagulation with Xarelto with prior DVT. No new bleeding. Bright red blood with BM at times past few years. None in past 2-3 months. No recent colonoscopy noted - plans to discuss next visit - it appears he had appt with GI in past.  Lab Results  Component Value Date   WBC 7.2 09/17/2020   HGB 17.2 (H) 09/17/2020   HCT 48.2 09/17/2020   MCV 95.0 09/17/2020   PLT 146.0 (L) 09/17/2020     Hypertension:  reporting some elevated readings at home with his meter. Home readings:  150 or higher/80 or higher diastolic.  Chronic edema - not taking diuretics.  BP Readings from Last 3 Encounters:  12/05/20 138/80  10/08/20 140/74  09/17/20 136/78   Lab Results  Component Value Date   CREATININE 1.54 (H) 05/07/2020   Hematuria, proteinuria: Cardiologist advised he had protein and blood in urine at last visit few months ago.  Saw urologist about a year ago for hematuria? Told did not have blood in urine.  No gross hematuria, no dysuria. Some hesitancy for months.    History Patient Active Problem List   Diagnosis Date Noted   Unilateral primary osteoarthritis, right knee 12/15/2019   Generalized weakness 09/14/2019   Severe major depression ([place]) 11/17/2018   Class 2 obesity due to excess calories without serious comorbidity with body mass index (BMI) of 36.0 to 36.9 in adult 11/17/2018   Anticoagulated 08/24/2018   Dyslipidemia 07/27/2018   Essential hypertension 06/25/2018   Hypogonadism in male  05/10/2018   Numbness 03/31/2018   Diabetic peripheral neuropathy ([place]) 01/25/2018   History of syncope 01/25/2018   S/P transmetatarsal amputation of foot, right ([place]) 10/09/2017   Hypothyroidism    Constipation    History of DVT (deep vein thrombosis) 09/30/2017   Obesity, Class III, BMI 40-49.9 (morbid obesity) ([place]) 01/15/2017   DOE (dyspnea on exertion) 01/13/2017   Chronic migraine 09/17/2016   Gait abnormality 09/17/2016   Fall 12/05/2015   Toe ulcer, right ([place]) 09/19/2015   Decreased pedal pulses 09/19/2015   OSA on CPAP 09/05/2015   Major depressive disorder, recurrent episode, moderate ([place]) 09/05/2015   Low back pain 06/11/2015   Abnormality of gait 06/11/2015   Chronic renal insufficiency, stage III (moderate) ([place]) 08/09/2014   Insulin-requiring or dependent type II diabetes mellitus ([place]) 11/07/2013   Hepatic steatosis 09/09/2010   Human immunodeficiency virus (HIV) disease ([place]) 06/04/2006   Depression 06/04/2006   THROMBOPHLEBITIS NOS 06/04/2006   GERD 06/04/2006   ARTHRITIS, HAND 06/04/2006   Past Medical History:  Diagnosis Date   ADHD (attention deficit hyperactivity disorder)    Anxiety    Chronic kidney disease    Clotting disorder ([place])    Depression    Diabetes mellitus without complication ([place])    Diabetes mellitus, type II ([place])    Dizziness 03/17/2015   Essential hypertension 06/25/2018   GERD (gastroesophageal reflux disease)    HIV disease ([name])    HIV infection ([place])    Liver disease    OSA (obstructive sleep apnea) 07/25/2015   Uses CPAP regularly   Peripheral vascular disease ([place])    Ulcer    Past Surgical History:  Procedure Laterality Date   AMPUTATION Right 10/02/2017   Procedure: RIGHT TRANSMETATARSAL AMPUTATION;  Surgeon: Leandrew Koyanagi, MD;  Location: [facility];  Service: Orthopedics;  Laterality: Right;   SMALL INTESTINE SURGERY     STOMACH SURGERY     TOE AMPUTATION Right 08/2016   right great toe   Allergies  Allergen Reactions    Aspirin Swelling   Ibuprofen Swelling   Sustiva [Efavirenz] Swelling and Rash   Nsaids Other (See Comments)    unknwn   Lipitor [Atorvastatin Calcium] Other (See Comments)    Leg pain   Prior to Admission medications   Medication Sig Start Date End Date Taking? Authorizing Provider  acetaminophen (TYLENOL) 500 MG tablet Take 1,500 mg by mouth in the morning and at bedtime.   Yes [provider]  ALPRAZolam Duanne Moron) 1 MG tablet Take 1 mg by mouth at bedtime. *May take one tablet up to 4 times daily as needed for anxiety   Yes [provider]  amphetamine-dextroamphetamine (ADDERALL) 30 MG tablet Take 30 mg by mouth 3 (three) times daily.   Yes [provider]  B-D ULTRAFINE III SHORT PEN 31G X 8 MM MISC USE AS DIRECTED THREE TIMES DAILY 10/09/20  Yes Renato Shin,  MD  brexpiprazole (REXULTI) 1 MG TABS tablet Take 1 mg by mouth at bedtime.   Yes [provider]  canagliflozin (INVOKANA) 300 MG TABS tablet Take 1 tablet (300 mg total) by mouth daily before breakfast. 10/08/20  Yes Renato Shin, MD  Continuous Blood Gluc Sensor (FREESTYLE LIBRE 14 DAY SENSOR) MISC USE AS DIRECTED EVERY 14 DAYS 11/18/19  Yes Renato Shin, MD  diclofenac Sodium (VOLTAREN) 1 % GEL APPLY 2 GM TO EACH KNEE EVERY MORNING AND EVERY NIGHT AT BEDTIME AND 1 GM TO EACH KNEE IN THE AFTERNOON 11/16/20  Yes [name], MD  divalproex (DEPAKOTE ER) 500 MG 24 hr tablet TAKE 1 TABLET(500 MG) BY MOUTH AT BEDTIME 11/16/20  Yes [name], MD  Dulaglutide (TRULICITY) 4.5 YQ/8.2NO SOPN Inject 4.5 mg as directed once a week. 10/08/20  Yes Renato Shin, MD  ezetimibe (ZETIA) 10 MG tablet TAKE 1 TABLET(10 MG) BY MOUTH DAILY 03/13/20  Yes Skeet Latch, MD  Insulin Aspart FlexPen 100 UNIT/ML SOPN INJECT 15 UNITS UNDER THE SKIN WITH BREAKFAST AND 25 UNITS WITH SUPPER 08/17/20  Yes Renato Shin, MD  insulin glargine (LANTUS SOLOSTAR) 100 UNIT/ML Solostar Pen Inject 100 Units into the skin  every morning. 10/08/20  Yes Renato Shin, MD  levothyroxine (SYNTHROID, LEVOTHROID) 50 MCG tablet Take 1 tablet (50 mcg total) by mouth at bedtime. 07/24/15  Yes [name], MD  ondansetron (ZOFRAN) 8 MG tablet TAKE 1 TABLET(8 MG) BY MOUTH EVERY 8 HOURS AS NEEDED FOR NAUSEA OR VOMITING 10/21/19  Yes Comer, Okey Regal, MD  pantoprazole (PROTONIX) 40 MG tablet TAKE 1 TABLET(40 MG) BY MOUTH DAILY AT 6 AM 09/18/20  Yes [name], MD  pregabalin (LYRICA) 150 MG capsule TAKE 2 CAPSULES BY MOUTH TWICE DAILY 09/25/20  Yes [name], MD  protriptyline (VIVACTIL) 10 MG tablet Take 10 mg by mouth 3 (three) times daily.  01/30/16  Yes [provider]  TRINTELLIX 20 MG TABS tablet Take 20 mg by mouth at bedtime.  01/26/15  Yes [provider]  TRIUMEQ 600-50-300 MG tablet TAKE 1 TABLET BY MOUTH DAILY 10/11/20  Yes Comer, Okey Regal, MD  XARELTO 20 MG TABS tablet TAKE 1 TABLET(20 MG) BY MOUTH DAILY WITH SUPPER 10/09/20  Yes [name], MD  zolpidem (AMBIEN) 10 MG tablet Take 10 mg by mouth at bedtime.   Yes [provider]   Social History   Socioeconomic History   Marital status: Single    Spouse name: Not on file   Number of children: Not on file   Years of education: Not on file   Highest education level: Not on file  Occupational History    Comment: DISABILITY  Tobacco Use   Smoking status: Former    Packs/day: 0.10    Years: 10.00    Pack years: 1.00    Types: Cigars, Cigarettes    Quit date: 08/09/2014    Years since quitting: 6.3   Smokeless tobacco: Never  Vaping Use   Vaping Use: Never used  Substance and Sexual Activity   Alcohol use: No    Alcohol/week: 0.0 standard drinks   Drug use: No   Sexual activity: Not Currently    Partners: Male    Comment: pt. declined condoms  Other Topics Concern   Not on file  Social History Narrative   Epworth Sleepiness Scale = 7 (as of 03/16/2015)   Social Determinants of Health   Financial Resource  Strain: Not on  file  Food Insecurity: Not on file  Transportation Needs: Not on file  Physical Activity: Not on file  Stress: Not on file  Social Connections: Not on file  Intimate Partner Violence: Not on file    Review of Systems Per HPI.   Objective:   Vitals:   12/05/20 1426  BP: 138/80  Pulse: 86  Temp: 98 F (36.7 C)  TempSrc: Temporal  SpO2: 96%  Weight: 258 lb 9.6 oz (117.3 kg)  Height: 5' 10"  (1.778 m)     Physical Exam Vitals reviewed.  Constitutional:      Appearance: He is well-developed.  HENT:     Head: Normocephalic and atraumatic.  Neck:     Vascular: No carotid bruit or JVD.  Cardiovascular:     Rate and Rhythm: Normal rate and regular rhythm.     Heart sounds: Normal heart sounds. No murmur heard. Pulmonary:     Effort: Pulmonary effort is normal.     Breath sounds: Normal breath sounds. No rales.  Musculoskeletal:     Right lower leg: No edema.     Left lower leg: No edema.     Comments: R low back area of discomfort, no midline bony ttp. Ambulates without assistive device or difficulty.   Skin:    General: Skin is warm and dry.  Neurological:     Mental Status: He is alert and oriented to person, place, and time.  Psychiatric:        Mood and Affect: Mood normal.    68mnutes spent during visit, including chart review, counseling and assimilation of information, exam, discussion of plan, and chart completion.    Assessment & Plan:  DTREG DIEMERis a 65y.o. male . Type 2 diabetes mellitus with stage 3 chronic kidney disease, with long-term current use of insulin, unspecified whether stage 3a or 3b CKD ([place] - Plan: Microalbumin / creatinine urine ratio  - check microalbumin, continue follow up with endocrinology.   Deep vein thrombosis (DVT) of proximal lower extremity, unspecified chronicity, unspecified laterality ([name]) - Plan: rivaroxaban (XARELTO) 20 MG TABS tablet  - tolerating Xarelto without new bleeding. Continue same.    Gastroesophageal reflux disease - Plan: pantoprazole (PROTONIX) 40 MG tablet  - continue protonix. Stable.   Diabetic peripheral neuropathy ([name])  - stable, continue   Personal history of fall - Plan: Ambulatory referral to Physical Therapy Chronic right-sided low back pain without sciatica - Plan: Ambulatory referral to Physical Therapy  - chronic back pain, denies recent injury/fall. Fall precautions discussed again and recommended use of walker for stability. Refer to PT for back pain and stability/balance.   Needs flu shot - Plan: Flu Vaccine QUAD High Dose(Fluad)  Hematuria, unspecified type - Plan: POCT urinalysis dipstick, PSA, Urine Microscopic Urinary hesitancy - Plan: POCT urinalysis dipstick, PSA, Urine Microscopic  - check PSA, urine micro to eval for true hematuria. Recheck next few weeks with RTC precautions if worsening sooner.   Meds ordered this encounter  Medications   rivaroxaban (XARELTO) 20 MG TABS tablet    Sig: TAKE 1 TABLET(20 MG) BY MOUTH DAILY WITH SUPPER    Dispense:  90 tablet    Refill:  0   pantoprazole (PROTONIX) 40 MG tablet    Sig: Take 1 tablet (40 mg total) by mouth daily.    Dispense:  90 tablet    Refill:  1   Patient Instructions  I will refer you to physical therapy for the back pain and to  evaluate walking and balance. I do recommend use of your walker to lessen risk of falls for now. Tylenol if needed.   I will check some labs, but please follow up in 2 weeks to discuss the other concerns from today further.   Return to the clinic or go to the nearest emergency room if any of your symptoms worsen or new symptoms occur.    Signed,   [name], MD [facility] 12/07/20 2:46 PM

## 2020-12-05 NOTE — Patient Instructions (Addendum)
I will refer you to physical therapy for the back pain and to evaluate walking and balance. I do recommend use of your walker to lessen risk of falls for now. Tylenol if needed.   I will check some labs, but please follow up in 2 weeks to discuss the other concerns from today further.   Return to the clinic or go to the nearest emergency room if any of your symptoms worsen or new symptoms occur.

## 2020-12-06 LAB — URINALYSIS, MICROSCOPIC ONLY

## 2020-12-06 LAB — MICROALBUMIN / CREATININE URINE RATIO
Creatinine,U: 64.5 mg/dL
Microalb Creat Ratio: 30.2 mg/g — ABNORMAL HIGH (ref 0.0–30.0)
Microalb, Ur: 19.5 mg/dL — ABNORMAL HIGH (ref 0.0–1.9)

## 2020-12-06 LAB — PSA: PSA: 3.76 ng/mL (ref 0.10–4.00)

## 2020-12-10 ENCOUNTER — Other Ambulatory Visit: Payer: Self-pay

## 2020-12-10 ENCOUNTER — Ambulatory Visit: Payer: PPO

## 2020-12-13 ENCOUNTER — Other Ambulatory Visit: Payer: Self-pay

## 2020-12-13 ENCOUNTER — Telehealth: Payer: Self-pay | Admitting: Endocrinology

## 2020-12-13 DIAGNOSIS — Z794 Long term (current) use of insulin: Secondary | ICD-10-CM

## 2020-12-13 DIAGNOSIS — N1831 Chronic kidney disease, stage 3a: Secondary | ICD-10-CM

## 2020-12-13 MED ORDER — INSULIN ASPART FLEXPEN 100 UNIT/ML ~~LOC~~ SOPN: PEN_INJECTOR | SUBCUTANEOUS | 0 refills | Status: DC

## 2020-12-13 MED ORDER — LANTUS SOLOSTAR 100 UNIT/ML ~~LOC~~ SOPN: 100.0000 [IU] | PEN_INJECTOR | SUBCUTANEOUS | 3 refills | Status: DC

## 2020-12-13 NOTE — Telephone Encounter (Signed)
Refill Request - Insulin Apart and Lantus received by fax from [facility] - put in Dr Cordelia Pen box at front desk

## 2020-12-13 NOTE — Telephone Encounter (Signed)
Rx sent 

## 2020-12-17 ENCOUNTER — Ambulatory Visit: Payer: PPO | Admitting: Family Medicine

## 2020-12-19 ENCOUNTER — Ambulatory Visit: Payer: PPO | Admitting: Physical Therapy

## 2020-12-19 ENCOUNTER — Other Ambulatory Visit: Payer: Self-pay | Admitting: Cardiovascular Disease

## 2020-12-19 ENCOUNTER — Other Ambulatory Visit: Payer: Self-pay | Admitting: Family Medicine

## 2020-12-19 DIAGNOSIS — E785 Hyperlipidemia, unspecified: Secondary | ICD-10-CM

## 2020-12-19 DIAGNOSIS — K219 Gastro-esophageal reflux disease without esophagitis: Secondary | ICD-10-CM

## 2020-12-19 DIAGNOSIS — B20 Human immunodeficiency virus [HIV] disease: Secondary | ICD-10-CM

## 2020-12-20 ENCOUNTER — Ambulatory Visit: Payer: PPO | Admitting: Rehabilitative and Restorative Service Providers"

## 2020-12-20 ENCOUNTER — Ambulatory Visit: Payer: PPO | Admitting: Family Medicine

## 2020-12-24 ENCOUNTER — Encounter: Payer: Self-pay | Admitting: Family Medicine

## 2020-12-25 DIAGNOSIS — F419 Anxiety disorder, unspecified: Secondary | ICD-10-CM | POA: Diagnosis not present

## 2020-12-25 DIAGNOSIS — Z7985 Long-term (current) use of injectable non-insulin antidiabetic drugs: Secondary | ICD-10-CM | POA: Diagnosis not present

## 2020-12-25 DIAGNOSIS — Z6837 Body mass index (BMI) 37.0-37.9, adult: Secondary | ICD-10-CM | POA: Diagnosis not present

## 2020-12-25 DIAGNOSIS — I509 Heart failure, unspecified: Secondary | ICD-10-CM | POA: Diagnosis not present

## 2020-12-25 DIAGNOSIS — Z7984 Long term (current) use of oral hypoglycemic drugs: Secondary | ICD-10-CM | POA: Diagnosis not present

## 2020-12-25 DIAGNOSIS — E261 Secondary hyperaldosteronism: Secondary | ICD-10-CM | POA: Diagnosis not present

## 2020-12-25 DIAGNOSIS — F330 Major depressive disorder, recurrent, mild: Secondary | ICD-10-CM | POA: Diagnosis not present

## 2020-12-25 DIAGNOSIS — Z794 Long term (current) use of insulin: Secondary | ICD-10-CM | POA: Diagnosis not present

## 2020-12-25 DIAGNOSIS — N183 Chronic kidney disease, stage 3 unspecified: Secondary | ICD-10-CM | POA: Diagnosis not present

## 2020-12-25 DIAGNOSIS — E1122 Type 2 diabetes mellitus with diabetic chronic kidney disease: Secondary | ICD-10-CM | POA: Diagnosis not present

## 2020-12-27 ENCOUNTER — Ambulatory Visit (INDEPENDENT_AMBULATORY_CARE_PROVIDER_SITE_OTHER): Payer: PPO | Admitting: Family Medicine

## 2020-12-27 ENCOUNTER — Encounter: Payer: Self-pay | Admitting: Family Medicine

## 2020-12-27 ENCOUNTER — Other Ambulatory Visit: Payer: Self-pay

## 2020-12-27 VITALS — BP 140/80 | HR 107 | Temp 98.3°F | Resp 16 | Ht 70.0 in | Wt 261.2 lb

## 2020-12-27 DIAGNOSIS — R3911 Hesitancy of micturition: Secondary | ICD-10-CM

## 2020-12-27 DIAGNOSIS — D2230 Melanocytic nevi of unspecified part of face: Secondary | ICD-10-CM

## 2020-12-27 DIAGNOSIS — R351 Nocturia: Secondary | ICD-10-CM | POA: Diagnosis not present

## 2020-12-27 DIAGNOSIS — R350 Frequency of micturition: Secondary | ICD-10-CM | POA: Diagnosis not present

## 2020-12-27 MED ORDER — TAMSULOSIN HCL 0.4 MG PO CAPS: 0.4000 mg | ORAL_CAPSULE | Freq: Every day | ORAL | 3 refills | Status: DC

## 2020-12-27 NOTE — Progress Notes (Signed)
Subjective:  Patient ID: Daniel [name], male    DOB: 11/05/1955  Age: 65 y.o. MRN: [id number]  CC:  Chief Complaint  Patient presents with   Urinary Retention    Pt reports worsening of hesitancy, would like referral to urology    Nevus    Pt reports needs moles removed would like referral to derm to do so     HPI Daniel [name] presents for   Urinary hesitancy, urinary symptoms Discussed at his October 12 visit.  Has been advised he had hematuria previously, cardiologist had noted he had protein and blood in the urine on a previous visit.  PSA obtained last visit was normal at 3.76 but increased from previous range of 1.14 in 2017.Trace protein on an office urinalysis, trace blood, negative nitrite/LE. Urine microscopy on October 12 with 0-2 WBC and RBC per high-powered field.  Presence of calcium oxalate crystals.  He did note some urinary hesitancy for months when discussed last visit.  no recent urology eval, had been referred in 2017 and appears due to hematuria, but no concerns at that time.  Nocturia 5-6 at night and during day, hesitancy and incomplete emptying. Trouble initialing urine if need to have a BM.  No dysuria, no blood.  No fever. Symptoms for moths to years - getting worse past year or so.  Lab Results  Component Value Date   HGBA1C 7.5 (A) 10/08/2020    Nevus evaluation R eyebrow, R temporal scalp, noted past few months, some enlargement, no d/c or bleeding.  left shoulder few years. - prior freezing. Derm in [place] - would like practice in [place].    History Patient Active Problem List   Diagnosis Date Noted   Unilateral primary osteoarthritis, right knee 12/15/2019   Generalized weakness 09/14/2019   Severe major depression ([place]) 11/17/2018   Class 2 obesity due to excess calories without serious comorbidity with body mass index (BMI) of 36.0 to 36.9 in adult 11/17/2018   Anticoagulated 08/24/2018   Dyslipidemia 07/27/2018   Essential hypertension  06/25/2018   Hypogonadism in male 05/10/2018   Numbness 03/31/2018   Diabetic peripheral neuropathy ([place]) 01/25/2018   History of syncope 01/25/2018   S/P transmetatarsal amputation of foot, right ([place]) 10/09/2017   Hypothyroidism    Constipation    History of DVT (deep vein thrombosis) 09/30/2017   Obesity, Class III, BMI 40-49.9 (morbid obesity) ([place]) 01/15/2017   DOE (dyspnea on exertion) 01/13/2017   Chronic migraine 09/17/2016   Gait abnormality 09/17/2016   Fall 12/05/2015   Toe ulcer, right ([place]) 09/19/2015   Decreased pedal pulses 09/19/2015   OSA on CPAP 09/05/2015   Major depressive disorder, recurrent episode, moderate ([place]) 09/05/2015   Low back pain 06/11/2015   Abnormality of gait 06/11/2015   Chronic renal insufficiency, stage III (moderate) ([place]) 08/09/2014   Insulin-requiring or dependent type II diabetes mellitus ([place]) 11/07/2013   Hepatic steatosis 09/09/2010   Human immunodeficiency virus (HIV) disease ([place]) 06/04/2006   Depression 06/04/2006   THROMBOPHLEBITIS NOS 06/04/2006   GERD 06/04/2006   ARTHRITIS, HAND 06/04/2006   Past Medical History:  Diagnosis Date   ADHD (attention deficit hyperactivity disorder)    Anxiety    Chronic kidney disease    Clotting disorder ([place])    Depression    Diabetes mellitus without complication ([place])    Diabetes mellitus, type II ([place])    Dizziness 03/17/2015   Essential hypertension 06/25/2018   GERD (gastroesophageal reflux disease)  HIV disease ([place])    HIV infection ([place])    Liver disease    OSA (obstructive sleep apnea) 07/25/2015   Uses CPAP regularly   Peripheral vascular disease ([place])    Ulcer    Past Surgical History:  Procedure Laterality Date   AMPUTATION Right 10/02/2017   Procedure: RIGHT TRANSMETATARSAL AMPUTATION;  Surgeon: Leandrew Koyanagi, MD;  Location: [facility];  Service: Orthopedics;  Laterality: Right;   SMALL INTESTINE SURGERY     STOMACH SURGERY     TOE AMPUTATION Right 08/2016   right great toe    Allergies  Allergen Reactions   Aspirin Swelling   Ibuprofen Swelling   Sustiva [Efavirenz] Swelling and Rash   Nsaids Other (See Comments)    unknwn   Lipitor [Atorvastatin Calcium] Other (See Comments)    Leg pain   Prior to Admission medications   Medication Sig Start Date End Date Taking? Authorizing Provider  acetaminophen (TYLENOL) 500 MG tablet Take 1,500 mg by mouth in the morning and at bedtime.   Yes [provider]  ALPRAZolam Duanne Moron) 1 MG tablet Take 1 mg by mouth at bedtime. *May take one tablet up to 4 times daily as needed for anxiety   Yes [provider]  amphetamine-dextroamphetamine (ADDERALL) 30 MG tablet Take 30 mg by mouth 3 (three) times daily.   Yes [provider]  B-D ULTRAFINE III SHORT PEN 31G X 8 MM MISC USE AS DIRECTED THREE TIMES DAILY 10/09/20  Yes Renato Shin, MD  brexpiprazole (REXULTI) 1 MG TABS tablet Take 1 mg by mouth at bedtime.   Yes [provider]  canagliflozin (INVOKANA) 300 MG TABS tablet Take 1 tablet (300 mg total) by mouth daily before breakfast. 10/08/20  Yes Renato Shin, MD  Continuous Blood Gluc Sensor (FREESTYLE LIBRE 14 DAY SENSOR) MISC USE AS DIRECTED EVERY 14 DAYS 11/18/19  Yes Renato Shin, MD  diclofenac Sodium (VOLTAREN) 1 % GEL APPLY 2 GM TO EACH KNEE EVERY MORNING AND EVERY NIGHT AT BEDTIME AND 1 GM TO EACH KNEE IN THE AFTERNOON 11/16/20  Yes [name], MD  divalproex (DEPAKOTE ER) 500 MG 24 hr tablet TAKE 1 TABLET(500 MG) BY MOUTH AT BEDTIME 11/16/20  Yes [name], MD  Dulaglutide (TRULICITY) 4.5 IZ/1.2WP SOPN Inject 4.5 mg as directed once a week. 10/08/20  Yes Renato Shin, MD  ezetimibe (ZETIA) 10 MG tablet TAKE 1 TABLET(10 MG) BY MOUTH DAILY. PLEASE SCHEDULE OVERDUE APPOINTMENT FOR FUTURE REFILLS. 1ST ATTEMPT. THANK YOU 12/19/20  Yes Skeet Latch, MD  Insulin Aspart FlexPen (NOVOLOG) 100 UNIT/ML INJECT 15 UNITS UNDER THE SKIN WITH BREAKFAST AND 25 UNITS WITH SUPPER  12/13/20  Yes Renato Shin, MD  insulin glargine (LANTUS SOLOSTAR) 100 UNIT/ML Solostar Pen Inject 100 Units into the skin every morning. 12/13/20  Yes Renato Shin, MD  levothyroxine (SYNTHROID, LEVOTHROID) 50 MCG tablet Take 1 tablet (50 mcg total) by mouth at bedtime. 07/24/15  Yes [name], MD  ondansetron (ZOFRAN) 8 MG tablet TAKE 1 TABLET(8 MG) BY MOUTH EVERY 8 HOURS AS NEEDED FOR NAUSEA OR VOMITING 10/21/19  Yes Comer, Okey Regal, MD  pantoprazole (PROTONIX) 40 MG tablet Take 1 tablet (40 mg total) by mouth daily. 12/05/20  Yes [name], MD  pregabalin (LYRICA) 150 MG capsule TAKE 2 CAPSULES BY MOUTH TWICE DAILY 09/25/20  Yes [name], MD  protriptyline (VIVACTIL) 10 MG tablet Take 10 mg by mouth 3 (three) times daily.  01/30/16  Yes [provider]  rivaroxaban (XARELTO) 20 MG TABS tablet TAKE 1 TABLET(20 MG) BY MOUTH DAILY WITH SUPPER 12/05/20  Yes [name], MD  TRINTELLIX 20 MG TABS tablet Take 20 mg by mouth at bedtime.  01/26/15  Yes [provider]  TRIUMEQ 600-50-300 MG tablet TAKE 1 TABLET BY MOUTH DAILY 10/11/20  Yes Comer, Okey Regal, MD  zolpidem (AMBIEN) 10 MG tablet Take 10 mg by mouth at bedtime.   Yes [provider]   Social History   Socioeconomic History   Marital status: Single    Spouse name: Not on file   Number of children: Not on file   Years of education: Not on file   Highest education level: Not on file  Occupational History    Comment: DISABILITY  Tobacco Use   Smoking status: Former    Packs/day: 0.10    Years: 10.00    Pack years: 1.00    Types: Cigars, Cigarettes    Quit date: 08/09/2014    Years since quitting: 6.3   Smokeless tobacco: Never  Vaping Use   Vaping Use: Never used  Substance and Sexual Activity   Alcohol use: No    Alcohol/week: 0.0 standard drinks   Drug use: No   Sexual activity: Not Currently    Partners: Male    Comment: pt. declined condoms  Other Topics Concern   Not on  file  Social History Narrative   Epworth Sleepiness Scale = 7 (as of 03/16/2015)   Social Determinants of Health   Financial Resource Strain: Not on file  Food Insecurity: Not on file  Transportation Needs: Not on file  Physical Activity: Not on file  Stress: Not on file  Social Connections: Not on file  Intimate Partner Violence: Not on file    Review of Systems Per HPI.   Objective:   Vitals:   12/27/20 1329  BP: 140/80  Pulse: (!) 107  Resp: 16  Temp: 98.3 F (36.8 C)  TempSrc: Temporal  SpO2: 97%  Weight: 261 lb 3.2 oz (118.5 kg)  Height: 5' 10"  (1.778 m)     Physical Exam Constitutional:      General: He is not in acute distress.    Appearance: Normal appearance. He is well-developed.  HENT:     Head: Normocephalic and atraumatic.  Cardiovascular:     Rate and Rhythm: Normal rate.  Pulmonary:     Effort: Pulmonary effort is normal.  Abdominal:     Tenderness: There is no abdominal tenderness. There is no right CVA tenderness or left CVA tenderness.  Skin:    Comments: Few slightly elevated, flesh-colored lesions with slight scaling on his right temporal scalp, just above right eyebrow.  Dark approximately 1.2 cm elevated lesion left shoulder.  See photos  Neurological:     Mental Status: He is alert and oriented to person, place, and time.  Psychiatric:        Mood and Affect: Mood normal.         31 minutes spent during visit, including chart review, lab review, additional concerns with skin lesions, counseling and assimilation of information, exam, discussion of plan, and chart completion.   Assessment & Plan:  Daniel [name] is a 65 y.o. male . Urinary hesitancy - Plan: tamsulosin (FLOMAX) 0.4 MG CAPS capsule Nocturia - Plan: tamsulosin (FLOMAX) 0.4 MG CAPS capsule Urinary frequency - Plan: tamsulosin (FLOMAX) 0.4 MG CAPS capsule  -Suspected BPH with lower urinary tract symptoms.  Ongoing symptoms without  significant increase/recent changes,  less likely infectious.  Previous urinalysis reviewed, PSA also normal yet higher than 5 years ago.  Refer to urology, start tamsulosin, potential side effects discussed including potential risk for dizziness/lightheadedness and fall precautions given his other medications.  Understanding expressed.  RTC precautions if acute changes  Nevus of face - Plan: Ambulatory referral to Urology, Ambulatory referral to Dermatology: Ambulatory referral to Dermatology -Possible seborrheic keratosis of shoulder, few new lesions on right face, differential includes basal cell versus squamous, refer to dermatology for evaluation.  Meds ordered this encounter  Medications   tamsulosin (FLOMAX) 0.4 MG CAPS capsule    Sig: Take 1 capsule (0.4 mg total) by mouth daily.    Dispense:  30 capsule    Refill:  3   Patient Instructions  I suspect your urinary symptoms are due to enlarged prostate.  Try tamsulosin once per day.  Watch for lightheadedness or dizziness with that medication as we discussed and let me know if any new side effects.  Make sure to get up slowly when taking that medication.  I will refer you to urology.  I also referred you to dermatology for the skin lesions.  Keep follow-up next month as planned. Return to the clinic or go to the nearest emergency room if any of your symptoms worsen or new symptoms occur.    Signed,   [name], MD [place] [facility] 12/27/20 2:35 PM

## 2020-12-27 NOTE — Patient Instructions (Signed)
I suspect your urinary symptoms are due to enlarged prostate.  Try tamsulosin once per day.  Watch for lightheadedness or dizziness with that medication as we discussed and let me know if any new side effects.  Make sure to get up slowly when taking that medication.  I will refer you to urology.  I also referred you to dermatology for the skin lesions.  Keep follow-up next month as planned. Return to the clinic or go to the nearest emergency room if any of your symptoms worsen or new symptoms occur.

## 2020-12-31 ENCOUNTER — Ambulatory Visit (INDEPENDENT_AMBULATORY_CARE_PROVIDER_SITE_OTHER): Payer: PPO | Admitting: Physical Therapy

## 2020-12-31 ENCOUNTER — Encounter: Payer: Self-pay | Admitting: Physical Therapy

## 2020-12-31 ENCOUNTER — Other Ambulatory Visit: Payer: Self-pay

## 2020-12-31 DIAGNOSIS — R2681 Unsteadiness on feet: Secondary | ICD-10-CM

## 2020-12-31 DIAGNOSIS — G8929 Other chronic pain: Secondary | ICD-10-CM

## 2020-12-31 DIAGNOSIS — R2689 Other abnormalities of gait and mobility: Secondary | ICD-10-CM

## 2020-12-31 DIAGNOSIS — M6281 Muscle weakness (generalized): Secondary | ICD-10-CM | POA: Diagnosis not present

## 2020-12-31 DIAGNOSIS — M545 Low back pain, unspecified: Secondary | ICD-10-CM | POA: Diagnosis not present

## 2020-12-31 NOTE — Therapy (Signed)
[place], [place], [address] Phone: [phone]   Fax:  [phone]  Physical Therapy Evaluation  Patient Details  Name: Daniel [name] MRN: [id number] Date of Birth: 05/16/1955 Referring Provider (PT): [name], MD   Encounter Date: 12/31/2020   PT End of Session - 12/31/20 8016     Visit Number 1    Number of Visits 12    Date for PT Re-Evaluation 02/11/21    PT Start Time 1438    PT Stop Time 1532    PT Time Calculation (min) 54 min    Activity Tolerance Patient tolerated treatment well    Behavior During Therapy [facility] for tasks assessed/performed             Past Medical History:  Diagnosis Date   ADHD (attention deficit hyperactivity disorder)    Anxiety    Chronic kidney disease    Clotting disorder ([place])    Depression    Diabetes mellitus without complication ([place])    Diabetes mellitus, type II ([place])    Dizziness 03/17/2015   Essential hypertension 06/25/2018   GERD (gastroesophageal reflux disease)    HIV disease ([place])    HIV infection ([place])    Liver disease    OSA (obstructive sleep apnea) 07/25/2015   Uses CPAP regularly   Peripheral vascular disease ([place])    Ulcer     Past Surgical History:  Procedure Laterality Date   AMPUTATION Right 10/02/2017   Procedure: RIGHT TRANSMETATARSAL AMPUTATION;  Surgeon: Leandrew Koyanagi, MD;  Location: [facility];  Service: Orthopedics;  Laterality: Right;   SMALL INTESTINE SURGERY     STOMACH SURGERY     TOE AMPUTATION Right 08/2016   right great toe    There were no vitals filed for this visit.    Subjective Assessment - 12/31/20 1439     Subjective Patient had all toes amputated on right foot (1999). He has had balance issues even before this time. He reports his legs are getting worse and weak. His last fall was in the past 6 months. He lives in the basement of a friend's house. He was just standing outside and fell. He has rails to get down to the back  of the house. The alternative is he parks in a gravel driveway and walks throught the yard. He has 3 narrow steps here. The inside stairs have rails on both sides and uses a step to gait. He also has fallen off the 3rd step of a ladder and landed on his back and hit his head. He is more limited by weakness than pain. He has a gym membership x 3 years, but has never been. He thought maybe his back pain was kidney related but he hasn't made an appt yet.    Pertinent History DM, HIV, HTN, anxiety/depression, CKD    How long can you walk comfortably? 100 feet    Patient Stated Goals get stronger and more mobile    Currently in Pain? Yes    Pain Score 2     Pain Location Back    Pain Orientation Right    Pain Descriptors / Indicators Dull;Constant    Pain Type Chronic pain    Pain Onset More than a month ago    Pain Frequency Constant    Aggravating Factors  sleeping in the bed                [facility] PT Assessment - 12/31/20 0001  Assessment   Medical Diagnosis h/o fall chronick right LBP without sciatica    Referring Provider (PT) [name], MD    Onset Date/Surgical Date 06/30/20    Hand Dominance Right    Next MD Visit 02/04/21      Precautions   Precautions Other (comment)    Precaution Comments see medical h/o      Restrictions   Weight Bearing Restrictions No      Balance Screen   Has the patient fallen in the past 6 months Yes    How many times? 2    Has the patient had a decrease in activity level because of a fear of falling?  No    Is the patient reluctant to leave their home because of a fear of falling?  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   Overall Cognitive Status Within Functional Limits for tasks assessed       Posture/Postural Control   Posture Comments stands in bil knee flex, wide BOS      ROM / Strength   AROM / PROM / Strength AROM;Strength      Ambulation/Gait   Ambulation/Gait Yes    Ambulation/Gait Assistance 6: Modified independent (Device/Increase time)    Ambulation Distance (Feet) 40 Feet    Assistive device None    Gait Pattern Step-through pattern;Decreased step length - left;Decreased stride length;Decreased hip/knee flexion - right;Decreased hip/knee flexion - left;Decreased dorsiflexion - right;Decreased dorsiflexion - left;Decreased trunk rotation;Wide base of support    Ambulation Surface Level    Gait velocity slow and cautious      Standardized Balance Assessment   Standardized Balance Assessment Berg Balance Test;Timed Up and Go Test;Five Times Sit to Stand    Five times sit to stand comments  14.87      Berg Balance Test   Sit to Stand Able to stand without using hands and stabilize independently    Standing Unsupported Able to stand safely 2 minutes    Sitting with Back Unsupported but Feet Supported on Floor or Stool Able to sit safely and securely 2 minutes    Stand to Sit Sits safely with minimal use of hands    Transfers Able to transfer safely, minor use of hands    Standing Unsupported with Eyes Closed Able to stand 10 seconds safely    Standing Unsupported with Feet Together Able to place feet together independently and stand for 1 minute with supervision    From Standing, Reach Forward with Outstretched Arm Can reach forward >12 cm safely (5")    From Standing Position, Pick up Object from Floor Able to pick up shoe safely and easily    From Standing Position, Turn to Look Behind Over each Shoulder Looks behind from both sides and weight shifts well    Turn 360 Degrees Able to turn 360 degrees safely in 4 seconds or less    Standing Unsupported, Alternately Place Feet on Step/Stool Able to stand independently and safely and complete 8 steps in 20 seconds   with  supervision; LOB after completion   Standing Unsupported, One Foot in Front Able to take small step independently and hold 30 seconds    Standing on  One Leg Able to lift leg independently and hold equal to or more than 3 seconds   left SLS 25 sec; Rt 4 sec   Total Score 50    Berg comment: loses balance backwards after SLS activities      Timed Up and Go Test   Normal TUG (seconds) 15.44                        Objective measurements completed on examination: See above findings.       [facility] Adult PT Treatment/Exercise - 12/31/20 0001       Self-Care   Self-Care Other Self-Care Comments    Other Self-Care Comments  discussed POC, effects of sedentary lifestyle, possible aquatic PT                     PT Education - 12/31/20 1556     Education Details HEP    Person(s) Educated Patient    Methods Explanation;Demonstration;Handout    Comprehension Verbalized understanding;Returned demonstration              PT Short Term Goals - 12/31/20 1916       PT SHORT TERM GOAL #1   Title Patient to be compliant with correct performance of HEP, to be updated PRN     Baseline --    Time 2    Period Weeks    Status New    Target Date 01/14/21      PT SHORT TERM GOAL #2   Title Functional tasks assessed and appropriate goals set    Baseline -    Time 2    Period Weeks    Status New      PT SHORT TERM GOAL #3   Title -               PT Long Term Goals - 12/31/20 1917       PT LONG TERM GOAL #1   Title Patient ind and compliant in advanced HEP for strength and balance    Baseline -    Time 6    Period Weeks    Status New      PT LONG TERM GOAL #2   Title Patient to improve TUG to < 10 seconds to decrease fall risk.    Baseline 15.44 sec    Time 6    Period Weeks    Status New      PT LONG TERM GOAL #3   Title Patient to decrease 5x sit to stand time to 11 seconds or less to decrease fall risk    Baseline 14.87 sec    Time 6     Period Weeks    Status New      PT LONG TERM GOAL #4   Title Patient to demonstrate Rt SLS time of 9 seconds or greater to decrease fall risk    Baseline Right SLS = 4 sec    Time 6    Period Weeks    Status New      PT LONG TERM GOAL #5   Title Patient able to safely ambulate on level and unlevel surfaces and up and down curbs/inclines without LOB using lease restrictive AD    Baseline -    Time 6    Period Weeks    Status New                    Plan - 12/31/20  1538     Clinical Impression Statement Patient presents with c/o of weakness in Bil LE and balance issues including 2 falls in the past 6 months. He also reports low back pain. All his toes have been amputated on his right foot since 1999 secondary to DM. Additionally patient has chronic LBP. He is unsure if the back pain is related to his CKD. He plans to f/u with his kidney MD. Patient lives a sedentary lifestyle and is obese. His TUG and 5x sit to stand are outside the norms for his age group. He scored 50/56 on the BERG balance assessment however he experienced multiple incidences of LOB in the clinic during the eval. He predominantly loses his balance backwards and he has good stepping strategies. He has some weakness in his right hip flexors, but generally good strengh in his BLE. He has tight gastrocsoleus on the left. Gait is slow and cautious with wide BOS and decreased heel strike bil. Functional activities and lumbar were not assessed due to time constraints and should be done at his next visit. He would like to return to the gym but has no motivation. He would like to learn what machines to use at the gym to help him get stronger. He also may benefit from aquatic therapy if back pain is prohibitive. Mr. Radle will benefit from skilled PT to address the above deficits and decrease future fall risk.    Personal Factors and Comorbidities Age;Behavior Pattern;Comorbidity 3+;Fitness    Comorbidities 5 digit amputation  right foot; CKD, low back pain, HTN, HIV, DM    Examination-Activity Limitations Locomotion Level;Stairs    Stability/Clinical Decision Making Stable/Uncomplicated    Clinical Decision Making Low    Rehab Potential Excellent    PT Frequency 2x / week    PT Duration 6 weeks    PT Treatment/Interventions ADLs/Self Care Home Management;Aquatic Therapy;Cryotherapy;Electrical Stimulation;Moist Heat;Neuromuscular re-education;Therapeutic exercise;Therapeutic activities;Balance training;Functional mobility training;Stair training;Gait training;Patient/family education;Manual techniques    PT Next Visit Plan Assess functional activities including stairs and set appropriate goals, high level balance activities, gait (see LTG), look at outside gait with cane; assess lumbar as needed; aquatic PT if indicated    PT Home Exercise Plan [email or web address]    Consulted and Agree with Plan of Care Patient             Patient will benefit from skilled therapeutic intervention in order to improve the following deficits and impairments:  Abnormal gait, Decreased range of motion, Pain, Decreased balance, Impaired flexibility, Postural dysfunction, Decreased strength, Difficulty walking, Obesity  Visit Diagnosis: Muscle weakness (generalized) - Plan: PT plan of care cert/re-cert  Other abnormalities of gait and mobility - Plan: PT plan of care cert/re-cert  Unsteadiness on feet - Plan: PT plan of care cert/re-cert  Chronic bilateral low back pain, unspecified whether sciatica present - Plan: PT plan of care cert/re-cert     Problem List Patient Active Problem List   Diagnosis Date Noted   Unilateral primary osteoarthritis, right knee 12/15/2019   Generalized weakness 09/14/2019   Severe major depression ([place]) 11/17/2018   Class 2 obesity due to excess calories without serious comorbidity with body mass index (BMI) of 36.0 to 36.9 in adult 11/17/2018   Anticoagulated 08/24/2018   Dyslipidemia 07/27/2018    Essential hypertension 06/25/2018   Hypogonadism in male 05/10/2018   Numbness 03/31/2018   Diabetic peripheral neuropathy ([place]) 01/25/2018   History of syncope 01/25/2018   S/P transmetatarsal amputation of foot, right ([place]) 10/09/2017  Hypothyroidism    Constipation    History of DVT (deep vein thrombosis) 09/30/2017   Obesity, Class III, BMI 40-49.9 (morbid obesity) ([place]) 01/15/2017   DOE (dyspnea on exertion) 01/13/2017   Chronic migraine 09/17/2016   Gait abnormality 09/17/2016   Fall 12/05/2015   Toe ulcer, right ([place]) 09/19/2015   Decreased pedal pulses 09/19/2015   OSA on CPAP 09/05/2015   Major depressive disorder, recurrent episode, moderate ([place]) 09/05/2015   Low back pain 06/11/2015   Abnormality of gait 06/11/2015   Chronic renal insufficiency, stage III (moderate) ([place]) 08/09/2014   Insulin-requiring or dependent type II diabetes mellitus ([place]) 11/07/2013   Hepatic steatosis 09/09/2010   Human immunodeficiency virus (HIV) disease ([place]) 06/04/2006   Depression 06/04/2006   THROMBOPHLEBITIS NOS 06/04/2006   GERD 06/04/2006   ARTHRITIS, HAND 06/04/2006    Madelyn Flavors, PT 12/31/2020, 7:30 PM  [facility] [address] [place], [place], [address] Phone: [phone]   Fax:  [phone]  Name: Daniel [name] MRN: [id number] Date of Birth: May 13, 1955

## 2020-12-31 NOTE — Patient Instructions (Signed)
Access Code: [email or web address] URL: [email or web address] Date: 12/31/2020 Prepared by: Almyra Free  Exercises Single Leg Stance with Support - 1 x daily - 7 x weekly - 1 sets - 5 reps - max hold Standing Romberg to 1/2 Tandem Stance - 2 x daily - 7 x weekly - 1 sets - 5 reps - max hold

## 2021-01-03 ENCOUNTER — Encounter: Payer: PPO | Admitting: Physical Therapy

## 2021-01-03 ENCOUNTER — Telehealth: Payer: Self-pay | Admitting: Physical Therapy

## 2021-01-03 NOTE — Telephone Encounter (Signed)
LVM as pt missed PT appt today.  Reminded of next scheduled appt and advised to call the office if he needs to cx.  Laureen Abrahams, PT, DPT 01/03/21 1:21 PM

## 2021-01-07 ENCOUNTER — Encounter: Payer: Self-pay | Admitting: Physical Therapy

## 2021-01-07 ENCOUNTER — Ambulatory Visit: Payer: PPO | Admitting: Physical Therapy

## 2021-01-07 ENCOUNTER — Other Ambulatory Visit: Payer: Self-pay

## 2021-01-07 DIAGNOSIS — R2681 Unsteadiness on feet: Secondary | ICD-10-CM

## 2021-01-07 DIAGNOSIS — M6281 Muscle weakness (generalized): Secondary | ICD-10-CM | POA: Diagnosis not present

## 2021-01-07 DIAGNOSIS — R29898 Other symptoms and signs involving the musculoskeletal system: Secondary | ICD-10-CM | POA: Diagnosis not present

## 2021-01-07 DIAGNOSIS — G8929 Other chronic pain: Secondary | ICD-10-CM | POA: Diagnosis not present

## 2021-01-07 DIAGNOSIS — R2689 Other abnormalities of gait and mobility: Secondary | ICD-10-CM

## 2021-01-07 DIAGNOSIS — M545 Low back pain, unspecified: Secondary | ICD-10-CM | POA: Diagnosis not present

## 2021-01-07 NOTE — Therapy (Signed)
[facility] [address] [place], [place], [address] Phone: [phone]   Fax:  [phone]  Physical Therapy Treatment  Patient Details  Name: Daniel [name] MRN: [id number] Date of Birth: 1956-02-21 Referring Provider (PT): [name], MD   Encounter Date: 01/07/2021   PT End of Session - 01/07/21 1340     Visit Number 2    Number of Visits 12    Date for PT Re-Evaluation 02/11/21    PT Start Time 5726    PT Stop Time 1340    PT Time Calculation (min) 41 min    Activity Tolerance Patient tolerated treatment well    Behavior During Therapy [facility] for tasks assessed/performed             Past Medical History:  Diagnosis Date   ADHD (attention deficit hyperactivity disorder)    Anxiety    Chronic kidney disease    Clotting disorder ([place])    Depression    Diabetes mellitus without complication ([place])    Diabetes mellitus, type II ([place])    Dizziness 03/17/2015   Essential hypertension 06/25/2018   GERD (gastroesophageal reflux disease)    HIV disease ([place])    HIV infection ([place])    Liver disease    OSA (obstructive sleep apnea) 07/25/2015   Uses CPAP regularly   Peripheral vascular disease ([place])    Ulcer     Past Surgical History:  Procedure Laterality Date   AMPUTATION Right 10/02/2017   Procedure: RIGHT TRANSMETATARSAL AMPUTATION;  Surgeon: Leandrew Koyanagi, MD;  Location: [facility];  Service: Orthopedics;  Laterality: Right;   SMALL INTESTINE SURGERY     STOMACH SURGERY     TOE AMPUTATION Right 08/2016   right great toe    There were no vitals filed for this visit.   Subjective Assessment - 01/07/21 1258     Subjective "knees always hurt." wants to work on strengthening today    Pertinent History DM, HIV, HTN, anxiety/depression, CKD    How long can you walk comfortably? 100 feet    Patient Stated Goals get stronger and more mobile    Currently in Pain? Yes    Pain Score 2     Pain Location Back    Pain Orientation Right     Pain Descriptors / Indicators Dull;Constant    Pain Type Chronic pain    Pain Onset More than a month ago    Pain Frequency Constant    Aggravating Factors  sleeping in the bed    Pain Relieving Factors unknown                               [facility] Adult PT Treatment/Exercise - 01/07/21 1303       Exercises   Exercises Knee/Hip      Knee/Hip Exercises: Aerobic   Nustep L6 x 8 min      Knee/Hip Exercises: Standing   SLS 5x15 sec bil; bil UE support    Other Standing Knee Exercises partial tandem stand 3x15 sec bil      Knee/Hip Exercises: Seated   Long Arc Quad Both;1 set;10 reps    Other Seated Knee/Hip Exercises seated SLR x10 bil    Sit to Sand 1 set;10 reps;without UE support      Knee/Hip Exercises: Supine   Bridges 10 reps    Other Supine Knee/Hip Exercises single limb clamshell with L4 band x10 reps bil  PT Education - 01/07/21 1340     Education Details HEP    Person(s) Educated Patient    Methods Explanation;Demonstration;Handout    Comprehension Verbalized understanding;Returned demonstration;Need further instruction              PT Short Term Goals - 12/31/20 1916       PT SHORT TERM GOAL #1   Title Patient to be compliant with correct performance of HEP, to be updated PRN     Baseline --    Time 2    Period Weeks    Status New    Target Date 01/14/21      PT SHORT TERM GOAL #2   Title Functional tasks assessed and appropriate goals set    Baseline -    Time 2    Period Weeks    Status New      PT SHORT TERM GOAL #3   Title -               PT Long Term Goals - 12/31/20 1917       PT LONG TERM GOAL #1   Title Patient ind and compliant in advanced HEP for strength and balance    Baseline -    Time 6    Period Weeks    Status New      PT LONG TERM GOAL #2   Title Patient to improve TUG to < 10 seconds to decrease fall risk.    Baseline 15.44 sec    Time 6    Period Weeks     Status New      PT LONG TERM GOAL #3   Title Patient to decrease 5x sit to stand time to 11 seconds or less to decrease fall risk    Baseline 14.87 sec    Time 6    Period Weeks    Status New      PT LONG TERM GOAL #4   Title Patient to demonstrate Rt SLS time of 9 seconds or greater to decrease fall risk    Baseline Right SLS = 4 sec    Time 6    Period Weeks    Status New      PT LONG TERM GOAL #5   Title Patient able to safely ambulate on level and unlevel surfaces and up and down curbs/inclines without LOB using lease restrictive AD    Baseline -    Time 6    Period Weeks    Status New                   Plan - 01/07/21 1341     Clinical Impression Statement Session today focused on review of HEP and strengthening exercises per his request.  Unable to perform functional activities outdoors due to weather today.  Will cotninue to benefit from PT to maximize function.    Personal Factors and Comorbidities Age;Behavior Pattern;Comorbidity 3+;Fitness    Comorbidities 5 digit amputation right foot; CKD, low back pain, HTN, HIV, DM    Examination-Activity Limitations Locomotion Level;Stairs    Stability/Clinical Decision Making Stable/Uncomplicated    Rehab Potential Excellent    PT Frequency 2x / week    PT Duration 6 weeks    PT Treatment/Interventions ADLs/Self Care Home Management;Aquatic Therapy;Cryotherapy;Electrical Stimulation;Moist Heat;Neuromuscular re-education;Therapeutic exercise;Therapeutic activities;Balance training;Functional mobility training;Stair training;Gait training;Patient/family education;Manual techniques    PT Next Visit Plan Assess functional activities including stairs and set appropriate goals, high level balance activities, gait (see LTG), look  at outside gait with cane; assess lumbar as needed; aquatic PT if indicated; review HEP PRN    PT Home Exercise Plan [email or web address]    Consulted and Agree with Plan of Care Patient              Patient will benefit from skilled therapeutic intervention in order to improve the following deficits and impairments:  Abnormal gait, Decreased range of motion, Pain, Decreased balance, Impaired flexibility, Postural dysfunction, Decreased strength, Difficulty walking, Obesity  Visit Diagnosis: Muscle weakness (generalized)  Other abnormalities of gait and mobility  Unsteadiness on feet  Chronic bilateral low back pain, unspecified whether sciatica present  Other symptoms and signs involving the musculoskeletal system     Problem List Patient Active Problem List   Diagnosis Date Noted   Unilateral primary osteoarthritis, right knee 12/15/2019   Generalized weakness 09/14/2019   Severe major depression ([place]) 11/17/2018   Class 2 obesity due to excess calories without serious comorbidity with body mass index (BMI) of 36.0 to 36.9 in adult 11/17/2018   Anticoagulated 08/24/2018   Dyslipidemia 07/27/2018   Essential hypertension 06/25/2018   Hypogonadism in male 05/10/2018   Numbness 03/31/2018   Diabetic peripheral neuropathy ([place]) 01/25/2018   History of syncope 01/25/2018   S/P transmetatarsal amputation of foot, right ([place]) 10/09/2017   Hypothyroidism    Constipation    History of DVT (deep vein thrombosis) 09/30/2017   Obesity, Class III, BMI 40-49.9 (morbid obesity) ([place]) 01/15/2017   DOE (dyspnea on exertion) 01/13/2017   Chronic migraine 09/17/2016   Gait abnormality 09/17/2016   Fall 12/05/2015   Toe ulcer, right ([place]) 09/19/2015   Decreased pedal pulses 09/19/2015   OSA on CPAP 09/05/2015   Major depressive disorder, recurrent episode, moderate ([place]) 09/05/2015   Low back pain 06/11/2015   Abnormality of gait 06/11/2015   Chronic renal insufficiency, stage III (moderate) ([place]) 08/09/2014   Insulin-requiring or dependent type II diabetes mellitus ([place]) 11/07/2013   Hepatic steatosis 09/09/2010   Human immunodeficiency virus (HIV) disease ([place]) 06/04/2006    Depression 06/04/2006   THROMBOPHLEBITIS NOS 06/04/2006   GERD 06/04/2006   ARTHRITIS, HAND 06/04/2006      Laureen Abrahams, PT, DPT 01/07/21 1:43 PM    [facility] [address] [place], [place], [address] Phone: [phone]   Fax:  [phone]  Name: Daniel [name] MRN: [id number] Date of Birth: 03/01/1955

## 2021-01-07 NOTE — Patient Instructions (Signed)
Access Code: [email or web address] URL: [email or web address] Date: 01/07/2021 Prepared by: Faustino Congress  Exercises Single Leg Stance with Support - 1 x daily - 7 x weekly - 1 sets - 5 reps - max hold Standing Romberg to 1/2 Tandem Stance - 1 x daily - 7 x weekly - 1 sets - 5 reps - max hold Seated Straight Leg Raise - 1 x daily - 7 x weekly - 3 sets - 10 reps Sit to Stand - 2 x daily - 7 x weekly - 1 sets - 10 reps Supine Bridge - 2 x daily - 7 x weekly - 1 sets - 10 reps - 5 sec hold

## 2021-01-09 ENCOUNTER — Encounter: Payer: Self-pay | Admitting: Internal Medicine

## 2021-01-09 ENCOUNTER — Other Ambulatory Visit: Payer: Self-pay

## 2021-01-09 ENCOUNTER — Ambulatory Visit: Payer: PPO | Admitting: Endocrinology

## 2021-01-09 ENCOUNTER — Encounter: Payer: Self-pay | Admitting: Endocrinology

## 2021-01-09 VITALS — BP 140/50 | HR 107 | Ht 70.0 in | Wt 257.2 lb

## 2021-01-09 DIAGNOSIS — E119 Type 2 diabetes mellitus without complications: Secondary | ICD-10-CM

## 2021-01-09 DIAGNOSIS — Z794 Long term (current) use of insulin: Secondary | ICD-10-CM | POA: Diagnosis not present

## 2021-01-09 DIAGNOSIS — E1122 Type 2 diabetes mellitus with diabetic chronic kidney disease: Secondary | ICD-10-CM

## 2021-01-09 DIAGNOSIS — N1831 Chronic kidney disease, stage 3a: Secondary | ICD-10-CM | POA: Diagnosis not present

## 2021-01-09 LAB — POCT GLYCOSYLATED HEMOGLOBIN (HGB A1C): Hemoglobin A1C: 7.2 % — AB (ref 4.0–5.6)

## 2021-01-09 NOTE — Patient Instructions (Addendum)
Please continue the same 3 diabetes medications.   check your blood sugar twice a day.  vary the time of day when you check, between before the 3 meals, and at bedtime.  also check if you have symptoms of your blood sugar being too high or too low.  please keep a record of the readings and bring it to your next appointment here (or you can bring the meter itself).  You can write it on any piece of paper.  please call us sooner if your blood sugar goes below 70, or if you have a lot of readings over 200.   Please come back for a follow-up appointment in 3-4 months.

## 2021-01-09 NOTE — Progress Notes (Signed)
Subjective:    Patient ID: Daniel [name], male    DOB: 1955/08/28, 65 y.o.   MRN: [id number]  HPI Pt returns for f/u of diabetes mellitus:  DM type: Insulin-requiring type 2.  Dx'ed: 8891 Complications: PN, PAD, CRI, and foot ulcers.   Therapy: insulin since 6945, Trulicity, and Invokana.   DKA: never Severe hypoglycemia: never.  Pancreatitis: never.   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   Other: he takes multiple daily injections, but basal insulin is emphasized, with improved results; he is retired; he eats meals at [employer] and 1AM  Interval history: I reviewed continuous glucose monitor data.  Glucose varies from 65-320.  It varies widely, but there is no trend throughout the day. He also has chronic primary hypothyroidism.  He takes synthroid as rx'ed.   Past Medical History:  Diagnosis Date   ADHD (attention deficit hyperactivity disorder)    Anxiety    Chronic kidney disease    Clotting disorder ([place])    Depression    Diabetes mellitus without complication ([place])    Diabetes mellitus, type II ([place])    Dizziness 03/17/2015   Essential hypertension 06/25/2018   GERD (gastroesophageal reflux disease)    HIV disease ([name])    HIV infection ([name])    Liver disease    OSA (obstructive sleep apnea) 07/25/2015   Uses CPAP regularly   Peripheral vascular disease ([place])    Ulcer     Past Surgical History:  Procedure Laterality Date   AMPUTATION Right 10/02/2017   Procedure: RIGHT TRANSMETATARSAL AMPUTATION;  Surgeon: Leandrew Koyanagi, MD;  Location: [facility];  Service: Orthopedics;  Laterality: Right;   SMALL INTESTINE SURGERY     STOMACH SURGERY     TOE AMPUTATION Right 08/2016   right great toe    Social History   Socioeconomic History   Marital status: Single    Spouse name: Not on file   Number of children: Not on file   Years of education: Not on file   Highest education level: Not on file  Occupational History    Comment:  DISABILITY  Tobacco Use   Smoking status: Former    Packs/day: 0.10    Years: 10.00    Pack years: 1.00    Types: Cigars, Cigarettes    Quit date: 08/09/2014    Years since quitting: 6.4   Smokeless tobacco: Never  Vaping Use   Vaping Use: Never used  Substance and Sexual Activity   Alcohol use: No    Alcohol/week: 0.0 standard drinks   Drug use: No   Sexual activity: Not Currently    Partners: Male    Comment: pt. declined condoms  Other Topics Concern   Not on file  Social History Narrative   Epworth Sleepiness Scale = 7 (as of 03/16/2015)   Social Determinants of Health   Financial Resource Strain: Not on file  Food Insecurity: Not on file  Transportation Needs: Not on file  Physical Activity: Not on file  Stress: Not on file  Social Connections: Not on file  Intimate Partner Violence: Not on file    Current Outpatient Medications on File Prior to Visit  Medication Sig Dispense Refill   acetaminophen (TYLENOL) 500 MG tablet Take 1,500 mg by mouth in the morning and at bedtime.     ALPRAZolam (XANAX) 1 MG tablet Take 1 mg by mouth at bedtime. *May take one tablet up to 4 times daily  as needed for anxiety     amphetamine-dextroamphetamine (ADDERALL) 30 MG tablet Take 30 mg by mouth 3 (three) times daily.     B-D ULTRAFINE III SHORT PEN 31G X 8 MM MISC USE AS DIRECTED THREE TIMES DAILY 100 each 2   brexpiprazole (REXULTI) 1 MG TABS tablet Take 1 mg by mouth at bedtime.     canagliflozin (INVOKANA) 300 MG TABS tablet Take 1 tablet (300 mg total) by mouth daily before breakfast. 90 tablet 3   Continuous Blood Gluc Sensor (FREESTYLE LIBRE 14 DAY SENSOR) MISC USE AS DIRECTED EVERY 14 DAYS 6 each 3   diclofenac Sodium (VOLTAREN) 1 % GEL APPLY 2 GM TO EACH KNEE EVERY MORNING AND EVERY NIGHT AT BEDTIME AND 1 GM TO EACH KNEE IN THE AFTERNOON 300 g 0   divalproex (DEPAKOTE ER) 500 MG 24 hr tablet TAKE 1 TABLET(500 MG) BY MOUTH AT BEDTIME 90 tablet 0   Dulaglutide (TRULICITY) 4.5  QQ/2.2LN SOPN Inject 4.5 mg as directed once a week. 6 mL 3   ezetimibe (ZETIA) 10 MG tablet TAKE 1 TABLET(10 MG) BY MOUTH DAILY. PLEASE SCHEDULE OVERDUE APPOINTMENT FOR FUTURE REFILLS. 1ST ATTEMPT. THANK YOU 30 tablet 0   Insulin Aspart FlexPen (NOVOLOG) 100 UNIT/ML INJECT 15 UNITS UNDER THE SKIN WITH BREAKFAST AND 25 UNITS WITH SUPPER 36 mL 0   insulin glargine (LANTUS SOLOSTAR) 100 UNIT/ML Solostar Pen Inject 100 Units into the skin every morning. 105 mL 3   levothyroxine (SYNTHROID, LEVOTHROID) 50 MCG tablet Take 1 tablet (50 mcg total) by mouth at bedtime. 30 tablet 11   ondansetron (ZOFRAN) 8 MG tablet TAKE 1 TABLET(8 MG) BY MOUTH EVERY 8 HOURS AS NEEDED FOR NAUSEA OR VOMITING 20 tablet 1   pantoprazole (PROTONIX) 40 MG tablet Take 1 tablet (40 mg total) by mouth daily. 90 tablet 1   pregabalin (LYRICA) 150 MG capsule TAKE 2 CAPSULES BY MOUTH TWICE DAILY 120 capsule 0   protriptyline (VIVACTIL) 10 MG tablet Take 10 mg by mouth 3 (three) times daily.   11   rivaroxaban (XARELTO) 20 MG TABS tablet TAKE 1 TABLET(20 MG) BY MOUTH DAILY WITH SUPPER 90 tablet 0   tamsulosin (FLOMAX) 0.4 MG CAPS capsule Take 1 capsule (0.4 mg total) by mouth daily. 30 capsule 3   TRINTELLIX 20 MG TABS tablet Take 20 mg by mouth at bedtime.      TRIUMEQ 600-50-300 MG tablet TAKE 1 TABLET BY MOUTH DAILY 30 tablet 5   zolpidem (AMBIEN) 10 MG tablet Take 10 mg by mouth at bedtime.     No current facility-administered medications on file prior to visit.    Allergies  Allergen Reactions   Aspirin Swelling   Ibuprofen Swelling   Sustiva [Efavirenz] Swelling and Rash   Nsaids Other (See Comments)    unknwn   Lipitor [Atorvastatin Calcium] Other (See Comments)    Leg pain    Family History  Problem Relation Age of Onset   Depression Brother    Throat cancer Brother        half brother, never smoker   COPD Mother    Diabetes Neg Hx     BP (!) 140/50 (BP Location: Right Arm, Patient Position: Sitting, Cuff  Size: Large)   Pulse (!) 107   Ht 5' 10"  (1.778 m)   Wt 257 lb 3.2 oz (116.7 kg)   SpO2 96%   BMI 36.90 kg/m    Review of Systems Denies nausea.  No change in chronic heartburn.  Objective:   Physical Exam    Lab Results  Component Value Date   HGBA1C 7.2 (A) 01/09/2021      Assessment & Plan:  Insulin-requiring type 2 DM Hypoglycemia, due to insulin.  Patient Instructions  Please continue the same 3 diabetes medications.   check your blood sugar twice a day.  vary the time of day when you check, between before the 3 meals, and at bedtime.  also check if you have symptoms of your blood sugar being too high or too low.  please keep a record of the readings and bring it to your next appointment here (or you can bring the meter itself).  You can write it on any piece of paper.  please call us sooner if your blood sugar goes below 70, or if you have a lot of readings over 200.   Please come back for a follow-up appointment in 3-4 months.

## 2021-01-10 ENCOUNTER — Encounter: Payer: Self-pay | Admitting: Physical Therapy

## 2021-01-10 ENCOUNTER — Ambulatory Visit: Payer: PPO | Admitting: Physical Therapy

## 2021-01-10 DIAGNOSIS — M6281 Muscle weakness (generalized): Secondary | ICD-10-CM

## 2021-01-10 DIAGNOSIS — R2681 Unsteadiness on feet: Secondary | ICD-10-CM

## 2021-01-10 DIAGNOSIS — R2689 Other abnormalities of gait and mobility: Secondary | ICD-10-CM | POA: Diagnosis not present

## 2021-01-10 DIAGNOSIS — R29898 Other symptoms and signs involving the musculoskeletal system: Secondary | ICD-10-CM

## 2021-01-10 DIAGNOSIS — G8929 Other chronic pain: Secondary | ICD-10-CM | POA: Diagnosis not present

## 2021-01-10 DIAGNOSIS — M545 Low back pain, unspecified: Secondary | ICD-10-CM | POA: Diagnosis not present

## 2021-01-10 NOTE — Therapy (Signed)
[facility] [address] [place], [place], [address] Phone: [phone]   Fax:  [phone]  Physical Therapy Treatment  Patient Details  Name: Daniel [name] MRN: [id number] Date of Birth: Apr 15, 1955 Referring Provider (PT): [name], MD   Encounter Date: 01/10/2021   PT End of Session - 01/10/21 1357     Visit Number 3    Number of Visits 12    Date for PT Re-Evaluation 02/11/21    PT Start Time 1257    PT Stop Time 1340    PT Time Calculation (min) 43 min    Activity Tolerance Patient tolerated treatment well    Behavior During Therapy [facility] for tasks assessed/performed             Past Medical History:  Diagnosis Date   ADHD (attention deficit hyperactivity disorder)    Anxiety    Chronic kidney disease    Clotting disorder ([place])    Depression    Diabetes mellitus without complication ([place])    Diabetes mellitus, type II ([place])    Dizziness 03/17/2015   Essential hypertension 06/25/2018   GERD (gastroesophageal reflux disease)    HIV disease ([place])    HIV infection ([place])    Liver disease    OSA (obstructive sleep apnea) 07/25/2015   Uses CPAP regularly   Peripheral vascular disease ([place])    Ulcer     Past Surgical History:  Procedure Laterality Date   AMPUTATION Right 10/02/2017   Procedure: RIGHT TRANSMETATARSAL AMPUTATION;  Surgeon: Leandrew Koyanagi, MD;  Location: [facility];  Service: Orthopedics;  Laterality: Right;   SMALL INTESTINE SURGERY     STOMACH SURGERY     TOE AMPUTATION Right 08/2016   right great toe    There were no vitals filed for this visit.   Subjective Assessment - 01/10/21 1300     Subjective "I'm going to be honest with you, I haven't done those exercises you gave me."    Pertinent History DM, HIV, HTN, anxiety/depression, CKD    How long can you walk comfortably? 100 feet    Patient Stated Goals get stronger and more mobile    Currently in Pain? Yes    Pain Score 5     Pain Location Knee     Pain Orientation Right    Pain Descriptors / Indicators Dull;Constant    Pain Type Chronic pain    Pain Onset More than a month ago    Pain Frequency Constant    Aggravating Factors  kneeling down on floor/getting up    Pain Relieving Factors unknown                               [facility] Adult PT Treatment/Exercise - 01/10/21 1301       Knee/Hip Exercises: Aerobic   Nustep L6 x 8 min      Knee/Hip Exercises: Machines for Strengthening   Cybex Leg Press 131# 3x10      Knee/Hip Exercises: Standing   Heel Raises Both;2 sets;10 reps    Heel Raises Limitations heel/toe raises    Hip Abduction Both;10 reps;Knee straight    Abduction Limitations UE support      Knee/Hip Exercises: Seated   Long Arc Quad Both;1 set;10 reps    Long Arc Quad Weight 4 lbs.    Other Seated Knee/Hip Exercises seated SLR x10 bil  PT Short Term Goals - 12/31/20 1916       PT SHORT TERM GOAL #1   Title Patient to be compliant with correct performance of HEP, to be updated PRN     Baseline --    Time 2    Period Weeks    Status New    Target Date 01/14/21      PT SHORT TERM GOAL #2   Title Functional tasks assessed and appropriate goals set    Baseline -    Time 2    Period Weeks    Status New      PT SHORT TERM GOAL #3   Title -               PT Long Term Goals - 12/31/20 1917       PT LONG TERM GOAL #1   Title Patient ind and compliant in advanced HEP for strength and balance    Baseline -    Time 6    Period Weeks    Status New      PT LONG TERM GOAL #2   Title Patient to improve TUG to < 10 seconds to decrease fall risk.    Baseline 15.44 sec    Time 6    Period Weeks    Status New      PT LONG TERM GOAL #3   Title Patient to decrease 5x sit to stand time to 11 seconds or less to decrease fall risk    Baseline 14.87 sec    Time 6    Period Weeks    Status New      PT LONG TERM GOAL #4   Title Patient to  demonstrate Rt SLS time of 9 seconds or greater to decrease fall risk    Baseline Right SLS = 4 sec    Time 6    Period Weeks    Status New      PT LONG TERM GOAL #5   Title Patient able to safely ambulate on level and unlevel surfaces and up and down curbs/inclines without LOB using lease restrictive AD    Baseline -    Time 6    Period Weeks    Status New                   Plan - 01/10/21 1357     Clinical Impression Statement Pt tolerated session well today with noted SOB needing increased rest between exercises.  Vitals stable, but he does report not taking his diuretic for the past 2 days.  Still has not done HEP and continued reinforcement to do at home.  Will continue to benefit from PT to maximize function.    Personal Factors and Comorbidities Age;Behavior Pattern;Comorbidity 3+;Fitness    Comorbidities 5 digit amputation right foot; CKD, low back pain, HTN, HIV, DM    Examination-Activity Limitations Locomotion Level;Stairs    Stability/Clinical Decision Making Stable/Uncomplicated    Rehab Potential Excellent    PT Frequency 2x / week    PT Duration 6 weeks    PT Treatment/Interventions ADLs/Self Care Home Management;Aquatic Therapy;Cryotherapy;Electrical Stimulation;Moist Heat;Neuromuscular re-education;Therapeutic exercise;Therapeutic activities;Balance training;Functional mobility training;Stair training;Gait training;Patient/family education;Manual techniques    PT Next Visit Plan Assess functional activities including stairs and set appropriate goals, high level balance activities, general strengthening    PT Home Exercise Plan [id number]    Consulted and Agree with Plan of Care Patient  Patient will benefit from skilled therapeutic intervention in order to improve the following deficits and impairments:  Abnormal gait, Decreased range of motion, Pain, Decreased balance, Impaired flexibility, Postural dysfunction, Decreased strength, Difficulty  walking, Obesity  Visit Diagnosis: Muscle weakness (generalized)  Other abnormalities of gait and mobility  Unsteadiness on feet  Chronic bilateral low back pain, unspecified whether sciatica present  Other symptoms and signs involving the musculoskeletal system     Problem List Patient Active Problem List   Diagnosis Date Noted   Unilateral primary osteoarthritis, right knee 12/15/2019   Generalized weakness 09/14/2019   Severe major depression ([place]) 11/17/2018   Class 2 obesity due to excess calories without serious comorbidity with body mass index (BMI) of 36.0 to 36.9 in adult 11/17/2018   Anticoagulated 08/24/2018   Dyslipidemia 07/27/2018   Essential hypertension 06/25/2018   Hypogonadism in male 05/10/2018   Numbness 03/31/2018   Diabetic peripheral neuropathy ([place]) 01/25/2018   History of syncope 01/25/2018   S/P transmetatarsal amputation of foot, right ([place]) 10/09/2017   Hypothyroidism    Constipation    History of DVT (deep vein thrombosis) 09/30/2017   Obesity, Class III, BMI 40-49.9 (morbid obesity) ([place]) 01/15/2017   DOE (dyspnea on exertion) 01/13/2017   Chronic migraine 09/17/2016   Gait abnormality 09/17/2016   Fall 12/05/2015   Toe ulcer, right ([place]) 09/19/2015   Decreased pedal pulses 09/19/2015   OSA on CPAP 09/05/2015   Major depressive disorder, recurrent episode, moderate ([place]) 09/05/2015   Low back pain 06/11/2015   Abnormality of gait 06/11/2015   Chronic renal insufficiency, stage III (moderate) ([place]) 08/09/2014   Insulin-requiring or dependent type II diabetes mellitus ([place]) 11/07/2013   Hepatic steatosis 09/09/2010   Human immunodeficiency virus (HIV) disease ([place]) 06/04/2006   Depression 06/04/2006   THROMBOPHLEBITIS NOS 06/04/2006   GERD 06/04/2006   ARTHRITIS, HAND 06/04/2006      Laureen Abrahams, PT, DPT 01/10/21 2:00 PM     [facility] [address] [place], [place],  [address] Phone: [phone]   Fax:  [phone]  Name: Daniel [name] MRN: [id number] Date of Birth: March 05, 1955

## 2021-01-14 ENCOUNTER — Encounter: Payer: PPO | Admitting: Physical Therapy

## 2021-01-15 ENCOUNTER — Encounter: Payer: PPO | Admitting: Rehabilitative and Restorative Service Providers"

## 2021-01-15 ENCOUNTER — Other Ambulatory Visit: Payer: Self-pay | Admitting: Internal Medicine

## 2021-01-15 ENCOUNTER — Other Ambulatory Visit: Payer: Self-pay | Admitting: Family Medicine

## 2021-01-15 ENCOUNTER — Other Ambulatory Visit: Payer: Self-pay | Admitting: Cardiovascular Disease

## 2021-01-15 DIAGNOSIS — E1142 Type 2 diabetes mellitus with diabetic polyneuropathy: Secondary | ICD-10-CM

## 2021-01-15 DIAGNOSIS — I10 Essential (primary) hypertension: Secondary | ICD-10-CM

## 2021-01-15 DIAGNOSIS — E785 Hyperlipidemia, unspecified: Secondary | ICD-10-CM

## 2021-01-15 DIAGNOSIS — B20 Human immunodeficiency virus [HIV] disease: Secondary | ICD-10-CM

## 2021-01-15 DIAGNOSIS — Z8669 Personal history of other diseases of the nervous system and sense organs: Secondary | ICD-10-CM

## 2021-01-15 DIAGNOSIS — G8929 Other chronic pain: Secondary | ICD-10-CM

## 2021-01-16 NOTE — Telephone Encounter (Signed)
Please call and clarify with patient - if he is still taking, ok to send in refill.

## 2021-01-16 NOTE — Telephone Encounter (Signed)
Lvm for patient to confirm if he is taking medication

## 2021-01-21 ENCOUNTER — Other Ambulatory Visit: Payer: Self-pay | Admitting: Family Medicine

## 2021-01-21 ENCOUNTER — Encounter: Payer: PPO | Admitting: Physical Therapy

## 2021-01-21 DIAGNOSIS — B20 Human immunodeficiency virus [HIV] disease: Secondary | ICD-10-CM

## 2021-01-21 NOTE — Telephone Encounter (Signed)
Patient is requesting a refill of the following medications: Requested Prescriptions   Pending Prescriptions Disp Refills   ondansetron (ZOFRAN) 8 MG tablet [Pharmacy Med Name: ONDANSETRON 8MG TABLETS] 20 tablet 1    Sig: TAKE 1 TABLET(8 MG) BY MOUTH EVERY 8 HOURS AS NEEDED FOR NAUSEA OR VOMITING    Date of patient request: 01/20/2021 Last office visit: 12/27/2020 Date of last refill: 10/21/2019 Last refill amount: 20 tablets 1 refill  Follow up time period per chart: 02/04/2021   Patient medication was refused by another provider 6 days ago.

## 2021-01-21 NOTE — Telephone Encounter (Signed)
Patient called back and states that this request was for another doctor's office and that we can ignore this request.

## 2021-01-22 ENCOUNTER — Encounter: Payer: Self-pay | Admitting: Family Medicine

## 2021-01-22 DIAGNOSIS — Z1283 Encounter for screening for malignant neoplasm of skin: Secondary | ICD-10-CM | POA: Diagnosis not present

## 2021-01-22 DIAGNOSIS — R350 Frequency of micturition: Secondary | ICD-10-CM

## 2021-01-22 DIAGNOSIS — R351 Nocturia: Secondary | ICD-10-CM

## 2021-01-22 DIAGNOSIS — R3911 Hesitancy of micturition: Secondary | ICD-10-CM

## 2021-01-22 DIAGNOSIS — L821 Other seborrheic keratosis: Secondary | ICD-10-CM | POA: Diagnosis not present

## 2021-01-22 DIAGNOSIS — D225 Melanocytic nevi of trunk: Secondary | ICD-10-CM | POA: Diagnosis not present

## 2021-01-22 MED ORDER — TAMSULOSIN HCL 0.4 MG PO CAPS: 0.4000 mg | ORAL_CAPSULE | Freq: Every day | ORAL | 0 refills | Status: DC

## 2021-01-22 NOTE — Telephone Encounter (Signed)
See patient message, last office visit. Can try up to 0.8 mg of tamsulosin but message sent to patient regarding potential risk at that dose, especially with history of dizziness.  Watch for worsening at higher dose.  Should be following up with urology soon.

## 2021-01-23 ENCOUNTER — Encounter: Payer: PPO | Admitting: Physical Therapy

## 2021-01-28 ENCOUNTER — Other Ambulatory Visit: Payer: Self-pay | Admitting: Family Medicine

## 2021-01-28 ENCOUNTER — Encounter: Payer: Self-pay | Admitting: Family Medicine

## 2021-01-28 ENCOUNTER — Other Ambulatory Visit: Payer: Self-pay

## 2021-01-28 DIAGNOSIS — E1142 Type 2 diabetes mellitus with diabetic polyneuropathy: Secondary | ICD-10-CM

## 2021-01-28 MED ORDER — PREGABALIN 150 MG PO CAPS: 300.0000 mg | ORAL_CAPSULE | Freq: Two times a day (BID) | ORAL | 0 refills | Status: DC

## 2021-01-31 ENCOUNTER — Telehealth: Payer: Self-pay | Admitting: Family Medicine

## 2021-01-31 ENCOUNTER — Encounter: Payer: PPO | Admitting: Physical Therapy

## 2021-01-31 MED ORDER — PREGABALIN 150 MG PO CAPS: 300.0000 mg | ORAL_CAPSULE | Freq: Two times a day (BID) | ORAL | 0 refills | Status: DC

## 2021-01-31 NOTE — Telephone Encounter (Signed)
..  Caller name: Maurine Cane  On DPR? :yes/no: Yes  Call back number:[phone]  Provider they see: Carlota Raspberry  Reason for call: Please send [place] to [facility] on [address] [place].    It was sent to [facility] in [place] on 01/28/2021 but they do not have the prescription.  Please send to [facility] on [address].

## 2021-01-31 NOTE — Addendum Note (Signed)
Addended by: Midge Minium on: 01/31/2021 03:30 PM   Modules accepted: Orders

## 2021-02-02 NOTE — Progress Notes (Signed)
Cardiology Office Note:    Date:  02/07/2021   ID:  Daniel [name], DOB December 13, 1955, MRN [id number]  PCP:  Daniel Agreste, MD  Cardiologist:  Daniel Latch, MD  Electrophysiologist:  None   Referring MD: Daniel Agreste, MD   Chief Complaint: follow-up of atypical chest pain and dyspnea on exertion   History of Present Illness:    Daniel [name] is a 65 y.o. male with a history of atypical chest pain with negative Myoview in 03/2018 with resolution of pain since, dizziness felt to be secondary to vertigo, recurrent DVT/PE on lifelong anticoagulation, right foot osteomyelitis s/p right great toe amputation in 08/2016 and right transmetatarsal amputation in 09/2017, hypertension, hyperlipidemia, type 2 diabetes mellitus, obstructive sleep apnea on CPAP, GERD, CKD stage III, and HIV who is followed by Dr. Oval [name] and presents today for routine follow-up.   Patient was initially referred to Dr. Oval [name] in 02/2015 for evaluation of dizziness that he described as the "room spinning" with recurrent falls. Dizziness was felt to be related to BPPV. Orthostatics were negative. Echo and carotid ultrasound were ordered for further evaluation. Echo showed LVEF of 60-65% with normal wall motion and normal diastolic function. Carotid ultrasound showed trace plaque in distal left common carotid artery but no evidence of internal carotid disease. Myoview was later ordered in 2017 for further evaluation of shortness of breath and this was low risk with no evidence of ischemia. Event monitor was placed in 06/2015 for further evaluation of syncope and was unremarkable. Repeat Myoview was ordered in 03/2018 for further evaluation of atypical chest pain and was low risk with no evidence of ischemia.   Patient was last seen by Dr. Oval [name] in 12/2019 at which time he was having a lot of knee pain and had upcoming right knee replacement planned. His activity was limited by his knees but he denied any chest pain. He  did report some shortness of breath with activity and he had some lower extremity edema on exam. Therefore, repeat  Echo was ordered and showed LVEF of 60-65% with normal wall motion, mild LVH, and grade 1 diastolic dysfunction. Lasix was increased for 3 days.  Patient presents today for follow-up. Here alone. Patient stable from a cardiac standpoint. He has chronic dyspnea with exertion but this is stable. He has chronic lower extremity edema for which he takes Lasix. This has actually been better lately.He states he has not been taking Lasix for the last couple weeks due to urinary problems.  He was recently started on Flomax for enlarged prostate and states urinary flow has improved with this so he will restart Lasix now.  He also describes very vague chest discomfort that he has a difficult time describing.  From what I am able to gather it is a diffuse discomfort that he describes as a very mild dullness all the way across his chest.  It is not associated with activity as he is not very active at baseline.  He states he has had this pain for years and that it is stable.  He had a Myoview in 2020 which was low risk and he does not denies any change in the symptoms since then.  He notes some heart racing if he overexerts himself but no other palpitations.  No lightheadedness/dizziness or syncope.  He does have some occasional right calf pain that improves with Voltaren gel  he saw the PAD sign on the door and was worried that he may have this.  However, pain is at rest and he denies any leg pain when walking.  He also has good distal pulses and no skin discoloration so I do not think this is PAD.  Past Medical History:  Diagnosis Date   ADHD (attention deficit hyperactivity disorder)    Anxiety    Chronic kidney disease    Clotting disorder ([place])    Depression    Diabetes mellitus without complication ([place])    Diabetes mellitus, type II ([place])    Dizziness 03/17/2015   Essential hypertension 06/25/2018    GERD (gastroesophageal reflux disease)    HIV disease ([name])    HIV infection ([name])    Liver disease    OSA (obstructive sleep apnea) 07/25/2015   Uses CPAP regularly   Peripheral vascular disease ([place])    Ulcer     Past Surgical History:  Procedure Laterality Date   AMPUTATION Right 10/02/2017   Procedure: RIGHT TRANSMETATARSAL AMPUTATION;  Surgeon: Daniel Koyanagi, MD;  Location: [facility];  Service: Orthopedics;  Laterality: Right;   SMALL INTESTINE SURGERY     STOMACH SURGERY     TOE AMPUTATION Right 08/2016   right great toe    Current Medications: Current Meds  Medication Sig   acetaminophen (TYLENOL) 500 MG tablet Take 1,500 mg by mouth in the morning and at bedtime.   ALPRAZolam (XANAX) 1 MG tablet Take 1 mg by mouth at bedtime. *May take one tablet up to 4 times daily as needed for anxiety   amLODipine (NORVASC) 5 MG tablet Take 1 tablet (5 mg total) by mouth daily.   amphetamine-dextroamphetamine (ADDERALL) 30 MG tablet Take 30 mg by mouth 3 (three) times daily.   B-D ULTRAFINE III SHORT PEN 31G X 8 MM MISC USE AS DIRECTED THREE TIMES DAILY   brexpiprazole (REXULTI) 1 MG TABS tablet Take 1 mg by mouth at bedtime.   canagliflozin (INVOKANA) 300 MG TABS tablet Take 1 tablet (300 mg total) by mouth daily before breakfast.   Continuous Blood Gluc Sensor (FREESTYLE LIBRE 14 DAY SENSOR) MISC USE AS DIRECTED EVERY 14 DAYS   diclofenac Sodium (VOLTAREN) 1 % GEL APPLY 2 GM TO EACH KNEE EVERY MORNING AND EVERY NIGHT AT BEDTIME, AND 1 GM TO EACH KNEE IN THE AFTERNOON   divalproex (DEPAKOTE ER) 500 MG 24 hr tablet TAKE 1 TABLET(500 MG) BY MOUTH AT BEDTIME   Dulaglutide (TRULICITY) 4.5 FY/1.0FB SOPN Inject 4.5 mg as directed once a week.   ezetimibe (ZETIA) 10 MG tablet TAKE 1 TABLET(10 MG) BY MOUTH DAILY, pt. Must keep appointment with office on 02/07/21 to get further refills   furosemide (LASIX) 40 MG tablet Take 1 tablet (40 mg total) by mouth daily.   Insulin Aspart FlexPen (NOVOLOG)  100 UNIT/ML INJECT 15 UNITS UNDER THE SKIN WITH BREAKFAST AND 25 UNITS WITH SUPPER   insulin glargine (LANTUS SOLOSTAR) 100 UNIT/ML Solostar Pen Inject 100 Units into the skin every morning.   levothyroxine (SYNTHROID, LEVOTHROID) 50 MCG tablet Take 1 tablet (50 mcg total) by mouth at bedtime.   ondansetron (ZOFRAN) 8 MG tablet TAKE 1 TABLET(8 MG) BY MOUTH EVERY 8 HOURS AS NEEDED FOR NAUSEA OR VOMITING   pantoprazole (PROTONIX) 40 MG tablet Take 1 tablet (40 mg total) by mouth daily.   pregabalin (LYRICA) 150 MG capsule Take 2 capsules (300 mg total) by mouth 2 (two) times daily.   protriptyline (VIVACTIL) 10 MG tablet Take 10 mg by mouth 3 (three) times daily.    rivaroxaban (XARELTO) 20  MG TABS tablet TAKE 1 TABLET(20 MG) BY MOUTH DAILY WITH SUPPER   tamsulosin (FLOMAX) 0.4 MG CAPS capsule Take 1-2 capsules (0.4-0.8 mg total) by mouth daily.   TRINTELLIX 20 MG TABS tablet Take 20 mg by mouth at bedtime.    TRIUMEQ 600-50-300 MG tablet TAKE 1 TABLET BY MOUTH DAILY   zolpidem (AMBIEN) 10 MG tablet Take 10 mg by mouth at bedtime.     Allergies:   Aspirin, Ibuprofen, Sustiva [efavirenz], Nsaids, and Lipitor [atorvastatin calcium]   Social History   Socioeconomic History   Marital status: Single    Spouse name: Not on file   Number of children: Not on file   Years of education: Not on file   Highest education level: Not on file  Occupational History    Comment: DISABILITY  Tobacco Use   Smoking status: Former    Packs/day: 0.10    Years: 10.00    Pack years: 1.00    Types: Cigars, Cigarettes    Quit date: 08/09/2014    Years since quitting: 6.5   Smokeless tobacco: Never  Vaping Use   Vaping Use: Never used  Substance and Sexual Activity   Alcohol use: No    Alcohol/week: 0.0 standard drinks   Drug use: No   Sexual activity: Not Currently    Partners: Male    Comment: pt. declined condoms  Other Topics Concern   Not on file  Social History Narrative   Epworth Sleepiness  Scale = 7 (as of 03/16/2015)   Social Determinants of Health   Financial Resource Strain: Not on file  Food Insecurity: Not on file  Transportation Needs: Not on file  Physical Activity: Not on file  Stress: Not on file  Social Connections: Not on file     Family History: The patient's family history includes COPD in his mother; Depression in his brother; Throat cancer in his brother. There is no history of Diabetes.  ROS:   Please see the history of present illness.     EKGs/Labs/Other Studies Reviewed:    The following studies were reviewed today:  Myoview 04/02/2018: Normal perfusion NO ischemia or scar Nuclear stress EF: 59%. There was no ST segment deviation noted during stress. The study is normal. This is a low risk study. _______________  Echocardiogram 01/31/2020: Impressions:  1. Left ventricular ejection fraction, by estimation, is 60 to 65%. The  left ventricle has normal function. The left ventricle has no regional  wall motion abnormalities. There is mild left ventricular hypertrophy.  Left ventricular diastolic parameters  are consistent with Grade I diastolic dysfunction (impaired relaxation).   2. Right ventricular systolic function is normal. The right ventricular  size is normal.   3. The mitral valve is grossly normal. No evidence of mitral valve  regurgitation.   4. The aortic valve was not well visualized. Aortic valve regurgitation  is not visualized.   5. Aortic dilatation noted. There is borderline dilatation of the aortic  root, measuring 39 mm.   6. The inferior vena cava is normal in size with greater than 50%  respiratory variability, suggesting right atrial pressure of 3 mmHg.   Comparison(s): A prior study was performed on 04/05/2015. No significant  change from prior study.   EKG:  EKG ordered today. EKG personally reviewed and demonstrates normal sinus rhythm, rate 89 bpm, with incomplete RBBB but no acute ST/T changes. Normal PR and QRS  intervals. QTc 430 ms.  Recent Labs: 05/07/2020: BUN 20; Creatinine, Ser 1.54;  Potassium 4.4; Sodium 139; TSH 0.47 09/17/2020: Hemoglobin 17.2; Platelets 146.0  Recent Lipid Panel    Component Value Date/Time   CHOL 239 (H) 05/19/2019 1719   TRIG 241 (H) 05/19/2019 1719   HDL 40 05/19/2019 1719   CHOLHDL 6.0 (H) 05/19/2019 1719   CHOLHDL 3.3 08/09/2015 0956   VLDL 22 08/09/2015 0956   LDLCALC 155 (H) 05/19/2019 1719    Physical Exam:    Vital Signs: BP (!) 160/82   Pulse 89   Ht 5' 10"  (1.778 m)   Wt 257 lb 12.8 oz (116.9 kg)   SpO2 98%   BMI 36.99 kg/m     Wt Readings from Last 3 Encounters:  02/07/21 257 lb 12.8 oz (116.9 kg)  02/04/21 253 lb 12.8 oz (115.1 kg)  01/09/21 257 lb 3.2 oz (116.7 kg)     General: 65 y.o. Caucasian male in no acute distress. HEENT: Normocephalic and atraumatic. Sclera clear. EOMs intact. Neck: Supple. No carotid bruits. No JVD. Heart: RRR. Distinct S1 and S2. No murmurs, gallops, or rubs. Radial, posterior tibial, and distal pedal pulses 2+ and equal bilaterally. Lungs: No increased work of breathing. Clear to ausculation bilaterally. No wheezes, rhonchi, or rales.  Abdomen: Soft, non-distended, and non-tender to palpation.  Extremities: Trace lower extremity edema bilaterally. S/p transmetatarsal amputation of right foot.   Skin: Warm and dry. Neuro: Alert and oriented x3. No focal deficits. Psych: Normal affect. Responds appropriately.  Assessment:    1. Dyspnea on exertion   2. Lower extremity edema   3. Chest pain of uncertain etiology   4. History of pulmonary embolus (PE)   5. History of DVT (deep vein thrombosis)   6. Primary hypertension   7. Hyperlipidemia, unspecified hyperlipidemia type   8. History of mylagia due to statin   9. Type 2 diabetes mellitus with complication, with long-term current use of insulin ([place])   10. Stage 3 chronic kidney disease, unspecified whether stage 3a or 3b CKD ([place])     Plan:    Dyspnea  On Exertion Chronic Lower Extremity Edema Last Echo in 01/2020 showed LVEF of 60-65% with normal wall motion, mild LVH, and grade 1 diastolic dysfunction.  - Stable.  - Previously on Lasix 59m daily but has not been taking the last 2 weeks due to urinary problems due to enlarged prostate. This is better after starting Flomax so recommended patient restart Lasix. - Suspect dyspnea is largely due to obesity and deconditioning. Recommended lifestyle modifications with diet and exercise.  Atypical Chest Pain Myoview in 03/2018 was low risk with no evidence of ischemia.  - He continues to have very vague chest discomfort at rest that he states he has had for several years and is unchanged. - EKG shows no acute ischemic changes. - No additional work-up necessary at this time. Advised patient to let [facility] if symptoms worsen or become associated with exertion.  Recurrent DVT/PE On lifelong Xarelto 224mdaily.  Hypertension Hypertension listed in chart but not on any medications.  BP elevated today in the 160s/70s-80s.  - Will start Amlodipine 54m58maily. Did explain that sometimes Amlodipine can cause worsening lower extremity edema; however, he only has trace lower extremity edema and patient is agreeable to trying this. - Advised patient to keep a BP/HR log for 2 weeks and then send this to us [facility] MyChart.  Hyperlipidemia - Last lipid panel in 04/2019: Total Cholesterol 239, Triglycerides 241, Hdl 40, LDL 155. - Intolerant to Lipitor in the  past due to myalgias (drug induced myopathy). Other statins stopped due to transaminitis. Continue Zetia 50m daily. - Patient is not fasting today so he will come back for lipid panel and CMET.   Type 2 Diabetes Mellitus - Hemoglobin A1c 7.5 in 09/2020. - Management per PCP/Endocrinology.   CKD Stage III Baseline creatinine around 1.5 to 1.6.  - Stable at 1.54 at last check in 04/2020.  Disposition: Follow up in 6 months.    Medication  Adjustments/Labs and Tests Ordered: Current medicines are reviewed at length with the patient today.  Concerns regarding medicines are outlined above.  Orders Placed This Encounter  Procedures   Comprehensive metabolic panel   Lipid panel   EKG 12-Lead   Meds ordered this encounter  Medications   amLODipine (NORVASC) 5 MG tablet    Sig: Take 1 tablet (5 mg total) by mouth daily.    Dispense:  180 tablet    Refill:  3   furosemide (LASIX) 40 MG tablet    Sig: Take 1 tablet (40 mg total) by mouth daily.    Dispense:  90 tablet    Refill:  3    Patient Instructions  Medication Instructions:  START Amlodipine 5 mg daily RESTART Lasix 40 mg daily  *If you need a refill on your cardiac medications before your next appointment, please call your pharmacy*  Lab Work: Your physician recommends that you return for lab work in 2-3 WEEKS:  CMP Fasting Lipid Panel-DO NOT EAT OR DRINK PAST MIDNIGHT. OKAY TO HAVE WATER.  If you have labs (blood work) drawn today and your tests are completely normal, you will receive your results only by: [facility](if you have MyChart) OR A paper copy in the mail If you have any lab test that is abnormal or we need to change your treatment, we will call you to review the results.  Testing/Procedures: NONE ordered at this time of appointment   Follow-Up: [facility] and your health needs are our priority.  As part of our continuing mission to provide you with exceptional heart care, we have created designated Provider Care Teams.  These Care Teams include your primary Cardiologist (physician) and Advanced Practice Providers (APPs -  Physician Assistants and Nurse Practitioners) who all work together to provide you with the care you need, when you need it.  Your next appointment:   6 month(s)  The format for your next appointment:   In Person  Provider:   TSkeet Latch MD   Other Instructions    Signed, CDarreld Mclean  PA-C  02/07/2021 5:54 PM    [place]

## 2021-02-04 ENCOUNTER — Encounter: Payer: Self-pay | Admitting: Family Medicine

## 2021-02-04 ENCOUNTER — Ambulatory Visit (INDEPENDENT_AMBULATORY_CARE_PROVIDER_SITE_OTHER): Payer: PPO | Admitting: Family Medicine

## 2021-02-04 VITALS — BP 128/66 | HR 96 | Temp 98.3°F | Resp 16 | Ht 70.0 in | Wt 253.8 lb

## 2021-02-04 DIAGNOSIS — R400 Somnolence: Secondary | ICD-10-CM | POA: Diagnosis not present

## 2021-02-04 DIAGNOSIS — R3911 Hesitancy of micturition: Secondary | ICD-10-CM | POA: Diagnosis not present

## 2021-02-04 DIAGNOSIS — R351 Nocturia: Secondary | ICD-10-CM | POA: Diagnosis not present

## 2021-02-04 NOTE — Progress Notes (Signed)
Subjective:  Patient ID: Daniel [name], male    DOB: [birth year]  Age: 65 y.o. MRN: [id number]  CC:  Chief Complaint  Patient presents with   Urinary Retention    Pt still having trouble with hesitation, scheduled with urology in about 1 week    Fatigue    Pt reports he has been sleeping 12-14 hours daily since starting Flomax and is concerned this medication is causing drowsiness    Nevus    Pt saw Derm for removal, they froze one on shoulder and declined removing one on his face.     HPI [name] presents for  Follow-up from November 3. Did see dermatology, had cryo treatment to lesion on shoulder, declined treatment to the face lesion. Has follow up next week with dermatology.   Urinary hesitancy: Last discussed November 3.  PSA had increased from 1.14 in 20 17-3.76.  Prior urology referral in 2017 for possible hematuria but no apparent concerns at that time.  Nocturia 5-6 times per night discussed last visit with hesitancy and incomplete emptying.  However no retention.  Did notice difficulty initiating voiding if he needed to have a bowel movement.  Symptoms have been present for months to the past year but has been getting worse.  Suspected BPH with LUTS.  Urinalysis has been reassuring, PSA slightly higher so referred to urology.  Started on Flomax 0.4 mg daily.  Potential side effects discussed.  See patient message regarding higher dose 01/22/21.   Initially has some improvement at 0.4 mg, and then some return of symptoms.  Increased to 0.8 mg total dosing with potential side effects discussed.  Has been noticing some increased somnolence since starting Flomax, sleeping up to 12 to 14 hours/day - past 2 weeks, since higher dose. Does see urology in approximately 1 week. Still having nocturia multiple times at night - 2 times past few days, prior was every 2 hours.   Bedtime 2-3 am, waking at noon. Taking 2 pills - slept until 11 pm one night.   History Patient Active  Problem List   Diagnosis Date Noted   Unilateral primary osteoarthritis, right knee 12/15/2019   Generalized weakness 09/14/2019   Severe major depression ([place]) 11/17/2018   Class 2 obesity due to excess calories without serious comorbidity with body mass index (BMI) of 36.0 to 36.9 in adult 11/17/2018   Anticoagulated 08/24/2018   Dyslipidemia 07/27/2018   Essential hypertension 06/25/2018   Hypogonadism in male 05/10/2018   Numbness 03/31/2018   Diabetic peripheral neuropathy ([place]) 01/25/2018   History of syncope 01/25/2018   S/P transmetatarsal amputation of foot, right ([place]) 10/09/2017   Hypothyroidism    Constipation    History of DVT (deep vein thrombosis) 09/30/2017   Obesity, Class III, BMI 40-49.9 (morbid obesity) ([place]) 01/15/2017   DOE (dyspnea on exertion) 01/13/2017   Chronic migraine 09/17/2016   Gait abnormality 09/17/2016   Fall 12/05/2015   Toe ulcer, right ([place]) 09/19/2015   Decreased pedal pulses 09/19/2015   OSA on CPAP 09/05/2015   Major depressive disorder, recurrent episode, moderate ([place]) 09/05/2015   Low back pain 06/11/2015   Abnormality of gait 06/11/2015   Chronic renal insufficiency, stage III (moderate) ([place]) 08/09/2014   Insulin-requiring or dependent type II diabetes mellitus ([place]) 11/07/2013   Hepatic steatosis 09/09/2010   Human immunodeficiency virus (HIV) disease ([place]) 06/04/2006   Depression 06/04/2006   THROMBOPHLEBITIS NOS 06/04/2006   GERD 06/04/2006   ARTHRITIS, HAND 06/04/2006   Past Medical  History:  Diagnosis Date   ADHD (attention deficit hyperactivity disorder)    Anxiety    Chronic kidney disease    Clotting disorder ([place])    Depression    Diabetes mellitus without complication ([place])    Diabetes mellitus, type II ([place])    Dizziness 03/17/2015   Essential hypertension 06/25/2018   GERD (gastroesophageal reflux disease)    HIV disease ([name])    HIV infection ([name])    Liver disease    OSA (obstructive sleep apnea) 07/25/2015    Uses CPAP regularly   Peripheral vascular disease ([place])    Ulcer    Past Surgical History:  Procedure Laterality Date   AMPUTATION Right 10/02/2017   Procedure: RIGHT TRANSMETATARSAL AMPUTATION;  Surgeon: Leandrew Koyanagi, MD;  Location: [facility];  Service: Orthopedics;  Laterality: Right;   SMALL INTESTINE SURGERY     STOMACH SURGERY     TOE AMPUTATION Right 08/2016   right great toe   Allergies  Allergen Reactions   Aspirin Swelling   Ibuprofen Swelling   Sustiva [Efavirenz] Swelling and Rash   Nsaids Other (See Comments)    unknwn   Lipitor [Atorvastatin Calcium] Other (See Comments)    Leg pain   Prior to Admission medications   Medication Sig Start Date End Date Taking? Authorizing Provider  acetaminophen (TYLENOL) 500 MG tablet Take 1,500 mg by mouth in the morning and at bedtime.   Yes [provider]  ALPRAZolam Duanne Moron) 1 MG tablet Take 1 mg by mouth at bedtime. *May take one tablet up to 4 times daily as needed for anxiety   Yes [provider]  amphetamine-dextroamphetamine (ADDERALL) 30 MG tablet Take 30 mg by mouth 3 (three) times daily.   Yes [provider]  B-D ULTRAFINE III SHORT PEN 31G X 8 MM MISC USE AS DIRECTED THREE TIMES DAILY 10/09/20  Yes Renato Shin, MD  brexpiprazole (REXULTI) 1 MG TABS tablet Take 1 mg by mouth at bedtime.   Yes [provider]  canagliflozin (INVOKANA) 300 MG TABS tablet Take 1 tablet (300 mg total) by mouth daily before breakfast. 10/08/20  Yes Renato Shin, MD  Continuous Blood Gluc Sensor (FREESTYLE LIBRE 14 DAY SENSOR) MISC USE AS DIRECTED EVERY 14 DAYS 11/18/19  Yes Renato Shin, MD  diclofenac Sodium (VOLTAREN) 1 % GEL APPLY 2 GM TO EACH KNEE EVERY MORNING AND EVERY NIGHT AT BEDTIME, AND 1 GM TO EACH KNEE IN THE AFTERNOON 01/15/21  Yes [name], MD  divalproex (DEPAKOTE ER) 500 MG 24 hr tablet TAKE 1 TABLET(500 MG) BY MOUTH AT BEDTIME 01/15/21  Yes [name], MD  Dulaglutide (TRULICITY)  4.5 NL/9.7QB SOPN Inject 4.5 mg as directed once a week. 10/08/20  Yes Renato Shin, MD  ezetimibe (ZETIA) 10 MG tablet TAKE 1 TABLET(10 MG) BY MOUTH DAILY, pt. Must keep appointment with office on 02/07/21 to get further refills 01/16/21  Yes Skeet Latch, MD  Insulin Aspart FlexPen (NOVOLOG) 100 UNIT/ML INJECT 15 UNITS UNDER THE SKIN WITH BREAKFAST AND 25 UNITS WITH SUPPER 12/13/20  Yes Renato Shin, MD  insulin glargine (LANTUS SOLOSTAR) 100 UNIT/ML Solostar Pen Inject 100 Units into the skin every morning. 12/13/20  Yes Renato Shin, MD  levothyroxine (SYNTHROID, LEVOTHROID) 50 MCG tablet Take 1 tablet (50 mcg total) by mouth at bedtime. 07/24/15  Yes [name], MD  ondansetron (ZOFRAN) 8 MG tablet TAKE 1 TABLET(8 MG) BY MOUTH EVERY 8 HOURS AS NEEDED FOR NAUSEA OR VOMITING 01/22/21  Yes [name], MD  pantoprazole (PROTONIX) 40 MG tablet Take 1 tablet (40 mg total) by mouth daily. 12/05/20  Yes [name], MD  pregabalin (LYRICA) 150 MG capsule Take 2 capsules (300 mg total) by mouth 2 (two) times daily. 01/31/21  Yes Midge Minium, MD  protriptyline (VIVACTIL) 10 MG tablet Take 10 mg by mouth 3 (three) times daily.  01/30/16  Yes [provider]  rivaroxaban (XARELTO) 20 MG TABS tablet TAKE 1 TABLET(20 MG) BY MOUTH DAILY WITH SUPPER 12/05/20  Yes [name], MD  tamsulosin (FLOMAX) 0.4 MG CAPS capsule Take 1-2 capsules (0.4-0.8 mg total) by mouth daily. 01/22/21  Yes [name], MD  TRINTELLIX 20 MG TABS tablet Take 20 mg by mouth at bedtime.  01/26/15  Yes [provider]  TRIUMEQ 600-50-300 MG tablet TAKE 1 TABLET BY MOUTH DAILY 10/11/20  Yes Comer, Okey Regal, MD  zolpidem (AMBIEN) 10 MG tablet Take 10 mg by mouth at bedtime.   Yes [provider]   Social History   Socioeconomic History   Marital status: Single    Spouse name: Not on file   Number of children: Not on file   Years of education: Not on file   Highest  education level: Not on file  Occupational History    Comment: DISABILITY  Tobacco Use   Smoking status: Former    Packs/day: 0.10    Years: 10.00    Pack years: 1.00    Types: Cigars, Cigarettes    Quit date: 08/09/2014    Years since quitting: 6.4   Smokeless tobacco: Never  Vaping Use   Vaping Use: Never used  Substance and Sexual Activity   Alcohol use: No    Alcohol/week: 0.0 standard drinks   Drug use: No   Sexual activity: Not Currently    Partners: Male    Comment: pt. declined condoms  Other Topics Concern   Not on file  Social History Narrative   Epworth Sleepiness Scale = 7 (as of 03/16/2015)   Social Determinants of Health   Financial Resource Strain: Not on file  Food Insecurity: Not on file  Transportation Needs: Not on file  Physical Activity: Not on file  Stress: Not on file  Social Connections: Not on file  Intimate Partner Violence: Not on file    Review of Systems Per HPI   Objective:   Vitals:   02/04/21 1440  BP: 128/66  Pulse: 96  Resp: 16  Temp: 98.3 F (36.8 C)  TempSrc: Temporal  SpO2: 98%  Weight: 253 lb 12.8 oz (115.1 kg)  Height: 5' 10"  (1.778 m)     Physical Exam Constitutional:      General: He is not in acute distress.    Appearance: Normal appearance. He is well-developed.  HENT:     Head: Normocephalic and atraumatic.  Cardiovascular:     Rate and Rhythm: Normal rate.  Pulmonary:     Effort: Pulmonary effort is normal.  Neurological:     Mental Status: He is alert and oriented to person, place, and time.  Psychiatric:        Mood and Affect: Mood normal.       Assessment & Plan:  TONNIE FRIEDEL is a 65 y.o. male . Urinary hesitancy  Nocturia  Somnolence Increased somnolence appears to be associated with higher dose of tamsulosin.  Has had some improvement in nocturia past few nights.  Discussed possible 5 alpha reductase inhibitor, however  with plan to follow-up with urology next week, increased PSA from  prior level, will avoid addition of this medication due to impact on PSA at this time.  If he does have worsening hesitancy, nocturia symptoms on lower dose of tamsulosin, can revisit this as an option with updated labs, or possible phosphodiesterase inhibitor (tadalafil 64m). Keep follow up with urology, RTC precautions if persistent or worsening somnolence  after med change above.  No orders of the defined types were placed in this encounter.  Patient Instructions  Decrease flomax to 1 pill as it appears 2 pills are causing the sleepiness.  Due to concern on effect of your PSA and evaluation with urology next week, I am hesitant to start a new medication at this time.  If you are having worsening symptoms on just 1 Flomax, let me know and we can look into other options.  If you have trouble initiating urination and are unable to initiate urination, be seen in the emergency room as that can be a condition called urinary retention.    Signed,   JMerri Ray MD [facility] 02/04/21 6:08 PM

## 2021-02-04 NOTE — Patient Instructions (Addendum)
Decrease flomax to 1 pill as it appears 2 pills are causing the sleepiness.  Due to concern on effect of your PSA and evaluation with urology next week, I am hesitant to start a new medication at this time.  If you are having worsening symptoms on just 1 Flomax, let me know and we can look into other options.  If you have trouble initiating urination and are unable to initiate urination, be seen in the emergency room as that can be a condition called urinary retention.

## 2021-02-06 ENCOUNTER — Other Ambulatory Visit: Payer: Self-pay | Admitting: Endocrinology

## 2021-02-06 DIAGNOSIS — E1122 Type 2 diabetes mellitus with diabetic chronic kidney disease: Secondary | ICD-10-CM

## 2021-02-06 DIAGNOSIS — Z794 Long term (current) use of insulin: Secondary | ICD-10-CM

## 2021-02-07 ENCOUNTER — Other Ambulatory Visit: Payer: Self-pay

## 2021-02-07 ENCOUNTER — Encounter: Payer: Self-pay | Admitting: Student

## 2021-02-07 ENCOUNTER — Ambulatory Visit: Payer: PPO | Admitting: Student

## 2021-02-07 ENCOUNTER — Other Ambulatory Visit: Payer: Self-pay | Admitting: Endocrinology

## 2021-02-07 VITALS — BP 160/82 | HR 89 | Ht 70.0 in | Wt 257.8 lb

## 2021-02-07 DIAGNOSIS — N183 Chronic kidney disease, stage 3 unspecified: Secondary | ICD-10-CM | POA: Diagnosis not present

## 2021-02-07 DIAGNOSIS — R600 Localized edema: Secondary | ICD-10-CM

## 2021-02-07 DIAGNOSIS — Z86718 Personal history of other venous thrombosis and embolism: Secondary | ICD-10-CM | POA: Diagnosis not present

## 2021-02-07 DIAGNOSIS — T466X5A Adverse effect of antihyperlipidemic and antiarteriosclerotic drugs, initial encounter: Secondary | ICD-10-CM | POA: Diagnosis not present

## 2021-02-07 DIAGNOSIS — Z86711 Personal history of pulmonary embolism: Secondary | ICD-10-CM | POA: Diagnosis not present

## 2021-02-07 DIAGNOSIS — M791 Myalgia, unspecified site: Secondary | ICD-10-CM | POA: Diagnosis not present

## 2021-02-07 DIAGNOSIS — I10 Essential (primary) hypertension: Secondary | ICD-10-CM | POA: Diagnosis not present

## 2021-02-07 DIAGNOSIS — R079 Chest pain, unspecified: Secondary | ICD-10-CM

## 2021-02-07 DIAGNOSIS — R0609 Other forms of dyspnea: Secondary | ICD-10-CM

## 2021-02-07 DIAGNOSIS — E785 Hyperlipidemia, unspecified: Secondary | ICD-10-CM

## 2021-02-07 DIAGNOSIS — Z794 Long term (current) use of insulin: Secondary | ICD-10-CM

## 2021-02-07 DIAGNOSIS — G720 Drug-induced myopathy: Secondary | ICD-10-CM | POA: Diagnosis not present

## 2021-02-07 DIAGNOSIS — E118 Type 2 diabetes mellitus with unspecified complications: Secondary | ICD-10-CM | POA: Diagnosis not present

## 2021-02-07 MED ORDER — FUROSEMIDE 40 MG PO TABS: 40.0000 mg | ORAL_TABLET | Freq: Every day | ORAL | 3 refills | Status: DC

## 2021-02-07 MED ORDER — AMLODIPINE BESYLATE 5 MG PO TABS: 5.0000 mg | ORAL_TABLET | Freq: Every day | ORAL | 3 refills | Status: DC

## 2021-02-07 NOTE — Patient Instructions (Signed)
Medication Instructions:  START Amlodipine 5 mg daily RESTART Lasix 40 mg daily  *If you need a refill on your cardiac medications before your next appointment, please call your pharmacy*  Lab Work: Your physician recommends that you return for lab work in 2-3 WEEKS:  CMP Fasting Lipid Panel-DO NOT EAT OR DRINK PAST MIDNIGHT. OKAY TO HAVE WATER.  If you have labs (blood work) drawn today and your tests are completely normal, you will receive your results only by: [place] (if you have MyChart) OR A paper copy in the mail If you have any lab test that is abnormal or we need to change your treatment, we will call you to review the results.  Testing/Procedures: NONE ordered at this time of appointment   Follow-Up: At [facility], you and your health needs are our priority.  As part of our continuing mission to provide you with exceptional heart care, we have created designated Provider Care Teams.  These Care Teams include your primary Cardiologist (physician) and Advanced Practice Providers (APPs -  Physician Assistants and Nurse Practitioners) who all work together to provide you with the care you need, when you need it.  Your next appointment:   6 month(s)  The format for your next appointment:   In Person  Provider:   Skeet Latch, MD   Other Instructions

## 2021-02-08 ENCOUNTER — Encounter: Payer: Self-pay | Admitting: Internal Medicine

## 2021-02-11 NOTE — Progress Notes (Signed)
Chronic Care Management Pharmacy Note  02/20/2021 Name:  Daniel [name] MRN:  [id number] DOB:  1955/03/23  Summary: Initial visit with PharmD.  Considerable time spent on educating for diet to better control sugars.  Patient drinking 3-4 gatorades/day and eating ice cream floats regularly.  BP also appears to be elevated based on office and home readings.  Have asked him to monitor this closely.  States he is taking 55m of Trintellix at the direction of Dr. RChucky May  Recommendations/Changes made from today's visit: Cut back on gatorades and ice cream - monitor fasting sugars a few times per week and record Check BP 2-3 times per week - will have CMA follow up Consult with KToy Careto determine if Trintellix dose is accurate  Patient also due for updated lipid panel - consider Crestor pending renal function or another trial of Lipitor even a few times per week if elevated  Plan: FU 60 days with PharmD CMA to call in 30 days to assess BP and glucose   Subjective: Daniel CHALKis an 65y.o. year old male who is a primary patient of Daniel [name].  The CCM team was consulted for assistance with disease management and care coordination needs.    Engaged with patient by telephone for initial visit in response to provider referral for pharmacy case management and/or care coordination services.   Consent to Services:  The patient was given the following information about Chronic Care Management services today, agreed to services, and gave verbal consent: 1. CCM service includes personalized support from designated clinical staff supervised by the primary care provider, including individualized plan of care and coordination with other care providers 2. 24/7 contact phone numbers for assistance for urgent and routine care needs. 3. Service will only be billed when office clinical staff spend 20 minutes or more in a month to coordinate care. 4. Only one practitioner may furnish and  bill the service in a calendar month. 5.The patient may stop CCM services at any time (effective at the end of the month) by phone call to the office staff. 6. The patient will be responsible for cost sharing (co-pay) of up to 20% of the service fee (after annual deductible is met). Patient agreed to services and consent obtained.  Patient Care Team: Daniel [name] as PCP - General (Family Medicine) [place], Daniel [name] as PCP - Infectious Diseases (Infectious Diseases) Daniel [name] as PCP - Cardiology (Cardiology) Daniel [name] (Inactive) as PCP - Gastroenterology (Gastroenterology) Daniel [name] as Consulting Physician (Podiatry) Daniel [name] as Consulting Physician (Endocrinology) Daniel [name](Optometry) Daniel [name] as Consulting Physician (Cardiology) Daniel [name] as Consulting Physician (Psychiatry) Daniel [name] as Consulting Physician (Nephrology) Daniel [name] as Attending Physician (Orthopedic Surgery) Daniel [name] as Consulting Physician (Neurology) Daniel [name] as Consulting Physician (Neurology) Daniel [name] as Consulting Physician (Ophthalmology) Daniel [name] - [facility](Pharmacist)  Recent office visits:  02/04/2021 Daniel [name] (PCP) - Family Medicine - Urinary hesitancy -  Decrease flomax to 1 pill as it appears 2 pills are causing the sleepiness.  Due to concern on effect of your PSA and evaluation with urology next week. Follow up as needed.    12/27/2020 Daniel [name] (PCP) - Family Medicine - Urinary hesitancy - Referral placed to Urology and Dermatology. Tamsulosin (FLOMAX) 0.4 MG CAPS capsule prescribed. Follow up in 1 month.    12/05/2020 Daniel [name] (  PCP) - Family Medicine - Diabetes - labs were ordered. Referral placed for Physical therapy. Pantoprazole (PROTONIX) 40 MG tablet prescribed. Follow up in 2 weeks.    09/17/2020 Daniel [name] (PCP) - Family  Medicine - Personal history of fall - Labs were ordered. Xray of Sacrum/Coccyx, Pelvis, and Chest ordered, Referral to Nephrology. Follow up if no improvement.    Recent consult visits:  02/07/2021 Daniel [name] - Cardiology - Dyspnea on exertion - Labs were ordered. EKG performed. amLODipine (NORVASC) 5 MG tablet and Furosemide (LASIX) 40 MG tablet prescribed. Follow up in 6 months.    01/09/21 Daniel [name] - Diabetes - Internal Medicine - [facility] [name] - [place] ordered. No medication changes.Check your blood sugar twice a day.  vary the time of day when you check, between before the 3 meals, and at bedtime. Follow up in 3-4 months.    10/08/2020 Daniel [name] - Class 2 Obesity - Internal Medicine - [name] ordered. canagliflozin (INVOKANA) 300 MG TABS tablet and Dulaglutide (TRULICITY) 4.5 WI/0.9BD SOPN prescribed. Reduce the Lantus to 100 units each morning increase the Trulicity and continue the same Invokana. Follow up in 2-3 months.    08/29/20 Daniel [name] - Neurology - OSA on CPAP -  Home sleep study ordered. Continue CPAP- pressure increased to 8-16 Home sleep test ordered. Will order new machine after test. Follow up in 1 year.      [name] visits:  None in previous 6 months   Objective:  Lab Results  Component Value Date   CREATININE 1.54 (H) 05/07/2020   BUN 20 05/07/2020   GFR 46.66 (L) 05/07/2020   GFRNONAA 46 (L) 01/04/2020   GFRAA 53 (L) 01/04/2020   NA 139 05/07/2020   K 4.4 05/07/2020   CALCIUM 9.6 05/07/2020   CO2 28 05/07/2020   GLUCOSE 68 (L) 05/07/2020    Lab Results  Component Value Date/Time   HGBA1C 7.2 (A) 01/09/2021 04:12 PM   HGBA1C 7.5 (A) 10/08/2020 03:48 PM   HGBA1C 8.7 (H) 06/28/2016 12:00 AM   HGBA1C 7.2 (H) 06/28/2015 08:48 AM   FRUCTOSAMINE 252 05/07/2020 04:37 PM   GFR 46.66 (L) 05/07/2020 04:37 PM   MICROALBUR 19.5 (H) 12/05/2020 03:58 PM   MICROALBUR 107.5 (H) 08/01/2014 01:29 PM    Last diabetic Eye exam:  Lab Results  Component Value  Date/Time   HMDIABEYEEXA No Retinopathy 02/06/2020 12:00 AM    Last diabetic Foot exam: No results found for: HMDIABFOOTEX   Lab Results  Component Value Date   CHOL 239 (H) 05/19/2019   HDL 40 05/19/2019   LDLCALC 155 (H) 05/19/2019   TRIG 241 (H) 05/19/2019   CHOLHDL 6.0 (H) 05/19/2019    Hepatic Function Latest Ref Rng & Units 09/15/2019 09/14/2019 09/13/2019  Total Protein 6.5 - 8.1 g/dL 6.7 7.2 7.1  Albumin 3.5 - 5.0 g/dL 3.5 3.7 -  AST 15 - 41 U/L 21 24 17   ALT 0 - 44 U/L 24 25 23   Alk Phosphatase 38 - 126 U/L 78 85 -  Total Bilirubin 0.3 - 1.2 mg/dL 0.7 0.7 0.8    Lab Results  Component Value Date/Time   TSH 0.47 05/07/2020 04:37 PM   TSH 0.108 (L) 09/14/2019 03:54 PM   TSH 0.736 09/17/2016 09:57 AM   FREET4 0.78 05/07/2020 04:37 PM   FREET4 1.04 09/15/2019 04:15 AM    CBC Latest Ref Rng & Units 09/17/2020 09/15/2019 09/14/2019  WBC 4.0 - 10.5 K/uL 7.2 6.0 6.0  Hemoglobin 13.0 -  17.0 g/dL 17.2(H) 15.9 16.0  Hematocrit 39.0 - 52.0 % 48.2 46.8 46.1  Platelets 150.0 - 400.0 K/uL 146.0(L) 125(L) 130(L)    Lab Results  Component Value Date/Time   VD25OH 23 (L) 02/12/2018 02:40 PM    Clinical ASCVD: Yes  The 10-year ASCVD risk score (Arnett DK, et al., 2019) is: 49.5%   Values used to calculate the score:     Age: 44 years     Sex: Male     Is Non-Hispanic African American: No     Diabetic: Yes     Tobacco smoker: No     Systolic Blood Pressure: 707 mmHg     Is BP treated: Yes     HDL Cholesterol: 40 mg/dL     Total Cholesterol: 239 mg/dL    Depression screen [facility] 2/9 02/04/2021 12/27/2020 12/05/2020  Decreased Interest 3 2 1   Down, Depressed, Hopeless 2 1 1   PHQ - 2 Score 5 3 2   Altered sleeping 1 2 1   Tired, decreased energy 2 2 1   Change in appetite 1 2 1   Feeling bad or failure about yourself  1 1 1   Trouble concentrating 1 1 1   Moving slowly or fidgety/restless 0 0 0  Suicidal thoughts 0 0 0  PHQ-9 Score 11 11 7   Some recent data might be hidden      Social History   Tobacco Use  Smoking Status Former   Packs/day: 0.10   Years: 10.00   Pack years: 1.00   Types: Cigars, Cigarettes   Quit date: 08/09/2014   Years since quitting: 6.5  Smokeless Tobacco Never   BP Readings from Last 3 Encounters:  02/07/21 (!) 160/82  02/04/21 128/66  01/09/21 (!) 140/50   Pulse Readings from Last 3 Encounters:  02/07/21 89  02/04/21 96  01/09/21 (!) 107   Wt Readings from Last 3 Encounters:  02/07/21 257 lb 12.8 oz (116.9 kg)  02/04/21 253 lb 12.8 oz (115.1 kg)  01/09/21 257 lb 3.2 oz (116.7 kg)   BMI Readings from Last 3 Encounters:  02/07/21 36.99 kg/m  02/04/21 36.42 kg/m  01/09/21 36.90 kg/m    Assessment/Interventions: Review of patient past medical history, allergies, medications, health status, including review of consultants reports, laboratory and other test data, was performed as part of comprehensive evaluation and provision of chronic care management services.   SDOH:  (Social Determinants of Health) assessments and interventions performed: Yes  Financial Resource Strain: Medium Risk   Difficulty of Paying Living Expenses: Somewhat hard    SDOH Screenings   Alcohol Screen: Not on file  Depression (PHQ2-9): Medium Risk   PHQ-2 Score: 11  Financial Resource Strain: Medium Risk   Difficulty of Paying Living Expenses: Somewhat hard  Food Insecurity: Not on file  Housing: Not on file  Physical Activity: Not on file  Social Connections: Not on file  Stress: Not on file  Tobacco Use: Medium Risk   Smoking Tobacco Use: Former   Smokeless Tobacco Use: Never   Passive Exposure: Not on file  Transportation Needs: Not on file    [place]  Allergies  Allergen Reactions   Aspirin Swelling   Ibuprofen Swelling   Sustiva [Efavirenz] Swelling and Rash   Nsaids Other (See Comments)    unknwn   Lipitor [Atorvastatin Calcium] Other (See Comments)    Leg pain    Medications Reviewed Today     Reviewed by  Eppie Gibson (Physician Assistant Certified) on 86/75/44 at  [place] List Status: <None>   Medication Order Taking? Sig Documenting Provider Last Dose Status Informant  acetaminophen (TYLENOL) 500 MG tablet [id number] Yes Take 1,500 mg by mouth in the morning and at bedtime. Provider, Historical, [name] Taking Active Self           Med Note Trinidad Curet, DANA K   Wed Sep 14, 2019  8:16 PM)    ALPRAZolam Duanne Moron) 1 MG tablet [id number] Yes Take 1 mg by mouth at bedtime. *May take one tablet up to 4 times daily as needed for anxiety Provider, Historical, [name] Taking Active Self  amLODipine (NORVASC) 5 MG tablet [id number] Yes Take 1 tablet (5 mg total) by mouth daily. Darreld Mclean, [name]  Active   amphetamine-dextroamphetamine (ADDERALL) 30 MG tablet [id number] Yes Take 30 mg by mouth 3 (three) times daily. Provider, Historical, [name] Taking Active Self  B-D ULTRAFINE III SHORT PEN 31G X 8 MM MISC [id number] Yes USE AS DIRECTED THREE TIMES DAILY Daniel [name] Taking Active   brexpiprazole (REXULTI) 1 MG TABS tablet [id number] Yes Take 1 mg by mouth at bedtime. Provider, Historical, [name] Taking Active Self  canagliflozin (INVOKANA) 300 MG TABS tablet [id number] Yes Take 1 tablet (300 mg total) by mouth daily before breakfast. Daniel [name] Taking Active   Continuous Blood Gluc Sensor (FREESTYLE LIBRE [place]) [place] [id number] Yes USE AS DIRECTED EVERY 14 DAYS Daniel [name] Taking Active   diclofenac Sodium (VOLTAREN) 1 % GEL [id number] Yes APPLY 2 GM TO EACH KNEE EVERY MORNING AND EVERY NIGHT AT BEDTIME, AND 1 GM TO EACH KNEE IN THE AFTERNOON [name], [name] Taking Active   divalproex (DEPAKOTE ER) 500 MG 24 hr tablet [id number] Yes TAKE 1 TABLET(500 MG) BY MOUTH AT BEDTIME [name], [name] Taking Active   Dulaglutide (TRULICITY) 4.5 FH/2.1FX SOPN [id number] Yes Inject 4.5 mg as directed once a week. Daniel [name] Taking Active   ezetimibe (ZETIA) 10 MG tablet [id number] Yes TAKE 1  TABLET(10 MG) BY MOUTH DAILY, pt. Must keep appointment with office on 02/07/21 to get further refills Skeet Latch, [name] Taking Active   furosemide (LASIX) 40 MG tablet [id number] Yes Take 1 tablet (40 mg total) by mouth daily. Daniel Rives E, [name]  Active   Insulin Aspart FlexPen (NOVOLOG) 100 UNIT/ML [id number] Yes INJECT 15 UNITS UNDER THE SKIN WITH BREAKFAST AND 25 UNITS WITH SUPPER Daniel [name] Taking Active   insulin glargine (LANTUS SOLOSTAR) 100 UNIT/ML Solostar Pen [id number] Yes Inject 100 Units into the skin every morning. Daniel [name] Taking Active   levothyroxine (SYNTHROID, LEVOTHROID) 50 MCG tablet [id number] Yes Take 1 tablet (50 mcg total) by mouth at bedtime. [name], [name] Taking Active Self  ondansetron (ZOFRAN) 8 MG tablet [id number] Yes TAKE 1 TABLET(8 MG) BY MOUTH EVERY 8 HOURS AS NEEDED FOR NAUSEA OR VOMITING [name], [name] Taking Active   pantoprazole (PROTONIX) 40 MG tablet [id number] Yes Take 1 tablet (40 mg total) by mouth daily. [name], [name] Taking Active   pregabalin (LYRICA) 150 MG capsule [id number] Yes Take 2 capsules (300 mg total) by mouth 2 (two) times daily. Midge Minium, [name] Taking Active   protriptyline (VIVACTIL) 10 MG tablet [id number] Yes Take 10 mg by mouth 3 (three) times daily.  Provider, Historical, [name] Taking Active Self           Med Note Luana Shu, NATASHA   Tue Jan 20, 2017  3:28 PM)  rivaroxaban (XARELTO) 20 MG TABS tablet [id number] Yes TAKE 1 TABLET(20 MG) BY MOUTH DAILY WITH SUPPER [name], [name] Taking Active   tamsulosin [facility] [name]) 0.4 MG CAPS capsule [id number] Yes Take 1-2 capsules (0.4-0.8 mg total) by mouth daily. [name], [name] Taking Active   TRINTELLIX 20 MG TABS tablet [id number] Yes Take 20 mg by mouth at bedtime.  Provider, Historical, [name] Taking Active Self           Med Note Geroge Baseman   Fri Feb 09, 2015  3:31 PM)    TRIUMEQ 600-50-300 MG tablet [id number] Yes TAKE 1 TABLET BY MOUTH DAILY  Comer, Okey Regal, [name] Taking Active   zolpidem (AMBIEN) 10 MG tablet [id number] Yes Take 10 mg by mouth at bedtime. Provider, Historical, [name] Taking Active Self            Patient Active Problem List   Diagnosis Date Noted   Statin myopathy 02/12/2021   Unilateral primary osteoarthritis, right knee 12/15/2019   Generalized weakness 09/14/2019   Severe major depression ([place]) 11/17/2018   Class 2 obesity due to excess calories without serious comorbidity with body mass index (BMI) of 36.0 to 36.9 in adult 11/17/2018   Anticoagulated 08/24/2018   Dyslipidemia 07/27/2018   Essential hypertension 06/25/2018   Hypogonadism in male 05/10/2018   Numbness 03/31/2018   Diabetic peripheral neuropathy ([place]) 01/25/2018   History of syncope 01/25/2018   S/P transmetatarsal amputation of foot, right ([place]) 10/09/2017   Hypothyroidism    Constipation    History of DVT (deep vein thrombosis) 09/30/2017   Obesity, Class III, BMI 40-49.9 (morbid obesity) ([place]) 01/15/2017   DOE (dyspnea on exertion) 01/13/2017   Chronic migraine 09/17/2016   Gait abnormality 09/17/2016   Fall 12/05/2015   Toe ulcer, right ([place]) 09/19/2015   Decreased pedal pulses 09/19/2015   OSA on CPAP 09/05/2015   Major depressive disorder, recurrent episode, moderate ([name]) 09/05/2015   Low back pain 06/11/2015   Abnormality of gait 06/11/2015   Chronic renal insufficiency, stage III (moderate) ([place]) 08/09/2014   Insulin-requiring or dependent type II diabetes mellitus ([place]) 11/07/2013   Hepatic steatosis 09/09/2010   Human immunodeficiency virus (HIV) disease ([place]) 06/04/2006   Depression 06/04/2006   THROMBOPHLEBITIS NOS 06/04/2006   GERD 06/04/2006   ARTHRITIS, HAND 06/04/2006    Immunization History  Administered Date(s) Administered   Fluad Quad(high Dose 65+) 11/17/2018, 10/26/2019, 12/05/2020   H1N1 03/09/2008   Hepatitis A 09/09/2010, 05/29/2011   Influenza Split 12/06/2010, 12/04/2011   Influenza Whole  01/26/2004, 12/25/2005, 01/14/2007, 12/22/2007, 11/24/2009   Influenza,inj,Quad PF,6+ Mos 11/04/2012, 11/07/2013, 01/10/2015, 11/12/2015, 11/27/2016, 11/16/2017   Janssen (J&J) SARS-COV-2 Vaccination 07/04/2019   Moderna Sars-Covid-2 Vaccination 01/27/2020   Pneumococcal Conjugate-13 09/03/2006, 11/17/2018   Pneumococcal Polysaccharide-23 03/12/2000, 05/29/2011, 04/29/2012   Tdap 08/01/2014   Zoster Recombinat (Shingrix) 12/10/2016, 02/18/2017   Zoster, Live 11/08/2015    Conditions to be addressed/monitored:  HTN, GERD, Hypothyroidism, Neuropathy, MDD, HLD, Type II DM  Care Plan : General Pharmacy (Adult)  Updates made by Edythe Clarity, RPH since 02/20/2021 12:00 AM     Problem: HTN, GERD, Hypothyroidism, Neuropathy, MDD, HLD, Type II DM   Priority: High  Onset Date: 02/20/2021     Long-Range Goal: Patient-Specific Goal   Start Date: 02/20/2021  Expected End Date: 08/21/2021  This Visit's Progress: On track  Priority: High  Note:   Current Barriers:  Unable to achieve control of glucose/BP  Suboptimal therapeutic regimen for cholesterol  Pharmacist Clinical  Goal(s):  Patient will achieve improvement in A1c and LDL as evidenced by follow up labs through collaboration with PharmD and provider.   Interventions: 1:1 collaboration with [name], [name] regarding development and update of comprehensive plan of care as evidenced by provider attestation and co-signature Inter-disciplinary care team collaboration (see longitudinal plan of care) Comprehensive medication review performed; medication list updated in electronic medical record  Hypertension (BP goal <130/80) -Uncontrolled -Current treatment: Amlodipine 65m daily -Medications previously tried: losartan  -Current home readings: 167/60-70 -Current dietary habits: high sodium intake, high carb and sugar intake - see DM for specifics -Denies hypotensive/hypertensive symptoms -Educated on BP goals and benefits  of medications for prevention of heart attack, stroke and kidney damage; Exercise goal of 150 minutes per week; Importance of home blood pressure monitoring; -Counseled to monitor BP at home a few times per week, document, and provide log at future appointments -Recommended to continue current medication Previous history of losartan with unknown d/c. Recent elevated BP in office as well as at home.  Already having some fluid retention with swelling noted in feet - would be hesitant to increase amlodipine for this reason.  CMA to follow up on BP in 30 days and we will assess at that time before adding new agents.  Hyperlipidemia: (LDL goal < 70) -Uncontrolled -Current treatment: Zetia 165mdaily -Medications previously tried: atorvastatin (leg pain)  -Current dietary patterns: see DM -Current exercise habits: minimal -Educated on Cholesterol goals;  Benefits of statin for ASCVD risk reduction; Importance of limiting foods high in cholesterol; -Recommended to continue current medication Recommended patient come in for updated lipid panel (last was March 2021).  He does not remember any leg pain associated with Lipitor. If LDL remains elevated, would consider Crestor pending kidney labs or starting back Lipitor even at once or twice weekly to start and titrate as tolerated.  Diabetes w/neuropathy (A1c goal <7%) -Uncontrolled -Current medications: Trulicity 4.5 mgMW/4.1LKantus 100 units every morning Novolog 15 units with breakfast and 25 units with supper Invokana 30055mefore breakfast Lyrica 150m57mcapsules bid -Medications previously tried: metformin  -Current home glucose readings fasting glucose: 150s usually, 180 today post prandial glucose: 250 usually  -Reports hypoglycemic/hyperglycemic symptoms - mentions a few episodes of hypoglycemia per week where he will get dizzy and sweaty -Current meal patterns:  breakfast: N/A  lunch: frozen meals, ramen  dinner: frozen meals,  ramen snacks: ice cream w/ pepsi float drinks: Regular gatorade, milk -Current exercise: minimal -Educated on A1c and blood sugar goals; Benefits of weight loss; Prevention and management of hypoglycemic episodes; Benefits of routine self-monitoring of blood sugar; -Counseled to check feet daily and get yearly eye exams -Counseled on diet and exercise extensively Recommended to continue current medication Currently I believe he could make big strides with simple improvements in his diet.  This would allow us tKoreafurther reduce insulin and prevent hypoglycemia.  Counseled on importance of regular meals and eating with all doses of insulin. Today we set a goal to limit gatorades to one per day as well as work to limit our servings of ice cream floats.  Patient agreeable to plan Will f/u in 60 days to assess changes.  Depression/Anxiety (Goal: Minimize symptoms) -Controlled -Current treatment: Trintellix 20mg74mly Trintellix 5mg d75my Rexulti 1mg da12m Alprazolam 1mg prn57mvactil 10mg tid54mdications previously tried/failed: Pristiq -PHQ9:  PHQ9 SCORE ONLY 02/04/2021 12/27/2020 12/05/2020  PHQ-9 Total Score 11 11 7   -Sees Dr. Rupinder Chucky Mayr year for  follow up. -Educated on Benefits of medication for symptom control -Recommended to continue current medication Would hesitate to make changes as he is seen by psychology - of note, Trintellix maximum dose is 31m per day - will consult with Dr. KToy Careto make sure she is aware of this dose.  GERD (Goal: Minimize symptoms) -Controlled -Current treatment  Pantoprazole 482mdaily -Medications previously tried: none noted -Discussed appropriate timing of medication and difference between other options such as tums and Pepcid.  -Recommended to continue current medication Work on trigger foods and medication timing.  Hypothyroidism (Goal: Maintain TSH) -Controlled -Current treatment  Levothyroxine 5036mdaily -Medications  previously tried: none noted -Most recent TSH was WNL - he takes this medication in the evening usually with milk.  -Recommended to continue current medication Counseled on appropriate timing of this medication, would hesitate to make big changes with TSH normal.  However if it continues to fluctuate he may need to make changes on medication timing to ensure adequate and consistent absorption.  HIV  -Controlled -Current treatment  Triumeq 600-50-300 daily -Medications previously tried: Stribild, GenJorje Guildtripla  -Recommended to continue current medication Assessed patient finances. [place] gets through [facility] [place]. Continue as prescribed.  Patient Goals/Self-Care Activities Patient will:  - take medications as prescribed as evidenced by patient report and record review check blood pressure a few times per week, document, and provide at future appointments engage in dietary modifications by limiting gatorades to one per day and work on cutting back on servings of ice cream  Follow Up Plan: The care management team will reach out to the patient again over the next 30 days.        Medication Assistance: None required.  Patient affirms current coverage meets needs.  Compliance/Adherence/Medication fill history: Care Gaps: Diabetic eye exam  Star-Rating Drugs: Dulaglutide (TRULICITY) 4.5 MG/VZ/8.5YIPN - last filled 12/21/2020 84 days   Patient[facility]:  [id number] [name] [id number]: [phone]: [phone]: [phone]: [phone] pill box? No - has own organization methods in a box Pt endorses 100% compliance  We discussed: Benefits of medication synchronization,  packaging and delivery as well as enhanced pharmacist oversight with [employer]. Patient decided to: Continue current medication management strategy  Care Plan and Follow Up Patient Decision:  Patient agrees to Care Plan and Follow-up.  Plan: The care management team will reach out to the patient again over the next 30 days.  ChrBeverly MilchharmD Clinical Pharmacist  [facility] [phone]

## 2021-02-12 DIAGNOSIS — G720 Drug-induced myopathy: Secondary | ICD-10-CM | POA: Insufficient documentation

## 2021-02-14 DIAGNOSIS — Z961 Presence of intraocular lens: Secondary | ICD-10-CM | POA: Diagnosis not present

## 2021-02-14 DIAGNOSIS — H524 Presbyopia: Secondary | ICD-10-CM | POA: Diagnosis not present

## 2021-02-14 DIAGNOSIS — Z794 Long term (current) use of insulin: Secondary | ICD-10-CM | POA: Diagnosis not present

## 2021-02-14 DIAGNOSIS — H52203 Unspecified astigmatism, bilateral: Secondary | ICD-10-CM | POA: Diagnosis not present

## 2021-02-14 DIAGNOSIS — H5202 Hypermetropia, left eye: Secondary | ICD-10-CM | POA: Diagnosis not present

## 2021-02-14 DIAGNOSIS — E119 Type 2 diabetes mellitus without complications: Secondary | ICD-10-CM | POA: Diagnosis not present

## 2021-02-14 LAB — HM DIABETES EYE EXAM

## 2021-02-15 DIAGNOSIS — N401 Enlarged prostate with lower urinary tract symptoms: Secondary | ICD-10-CM | POA: Diagnosis not present

## 2021-02-15 DIAGNOSIS — R3914 Feeling of incomplete bladder emptying: Secondary | ICD-10-CM | POA: Diagnosis not present

## 2021-02-15 DIAGNOSIS — R350 Frequency of micturition: Secondary | ICD-10-CM | POA: Diagnosis not present

## 2021-02-19 ENCOUNTER — Telehealth: Payer: Self-pay | Admitting: Pharmacist

## 2021-02-19 NOTE — Progress Notes (Signed)
Chronic Care Management Pharmacy Assistant   Name: Daniel [name]  MRN: [id number] DOB: 1956/02/23  Daniel [name] is an 65 y.o. year old male who presents for his initial CCM visit with the clinical pharmacist.  Reason for Encounter: Chart Prep for Initial visit with CPP on 02/20/21 @ 1:00 pm (Televisit)   Recent office visits:  02/04/2021 Corliss Parish, MD (PCP) - Family Medicine - Urinary hesitancy -  Decrease flomax to 1 pill as it appears 2 pills are causing the sleepiness.  Due to concern on effect of your PSA and evaluation with urology next week. Follow up as needed.   12/27/2020 [name], MD (PCP) - Family Medicine - Urinary hesitancy - Referral placed to Urology and Dermatology. Tamsulosin (FLOMAX) 0.4 MG CAPS capsule prescribed. Follow up in 1 month.    12/05/2020 [name], MD (PCP) - Family Medicine - Diabetes - labs were ordered. Referral placed for Physical therapy. Pantoprazole (PROTONIX) 40 MG tablet prescribed. Follow up in 2 weeks.   09/17/2020 [name], MD (PCP) - Family Medicine - Personal history of fall - Labs were ordered. Xray of Sacrum/Coccyx, Pelvis, and Chest ordered, Referral to Nephrology. Follow up if no improvement.   Recent consult visits:  02/07/2021 [name], PA-C - Cardiology - Dyspnea on exertion - Labs were ordered. EKG performed. amLODipine (NORVASC) 5 MG tablet and Furosemide (LASIX) 40 MG tablet prescribed. Follow up in 6 months.   01/09/21 Renato Shin, MD - Diabetes - Internal Medicine - [facility] ordered. No medication changes.Check your blood sugar twice a day.  vary the time of day when you check, between before the 3 meals, and at bedtime. Follow up in 3-4 months.   10/08/2020 Renato Shin, MD - Class 2 Obesity - Internal Medicine - [facility] ordered. canagliflozin (INVOKANA) 300 MG TABS tablet and Dulaglutide (TRULICITY) 4.5 WU/9.8JX SOPN prescribed. Reduce the Lantus to 100 units each morning increase the Trulicity and continue  the same Invokana. Follow up in 2-3 months.   08/29/20 Ward Givens, NP - Neurology - OSA on CPAP -  Home sleep study ordered. Continue CPAP- pressure increased to 8-16 Home sleep test ordered. Will order new machine after test. Follow up in 1 year.    Hospital visits:  None in previous 6 months  Medications: Outpatient Encounter Medications as of 02/19/2021  Medication Sig   acetaminophen (TYLENOL) 500 MG tablet Take 1,500 mg by mouth in the morning and at bedtime.   ALPRAZolam (XANAX) 1 MG tablet Take 1 mg by mouth at bedtime. *May take one tablet up to 4 times daily as needed for anxiety   amLODipine (NORVASC) 5 MG tablet Take 1 tablet (5 mg total) by mouth daily.   amphetamine-dextroamphetamine (ADDERALL) 30 MG tablet Take 30 mg by mouth 3 (three) times daily.   B-D ULTRAFINE III SHORT PEN 31G X 8 MM MISC USE AS DIRECTED THREE TIMES DAILY   brexpiprazole (REXULTI) 1 MG TABS tablet Take 1 mg by mouth at bedtime.   canagliflozin (INVOKANA) 300 MG TABS tablet Take 1 tablet (300 mg total) by mouth daily before breakfast.   Continuous Blood Gluc Sensor (FREESTYLE LIBRE 14 DAY SENSOR) MISC USE AS DIRECTED EVERY 14 DAYS   diclofenac Sodium (VOLTAREN) 1 % GEL APPLY 2 GM TO EACH KNEE EVERY MORNING AND EVERY NIGHT AT BEDTIME, AND 1 GM TO EACH KNEE IN THE AFTERNOON   divalproex (DEPAKOTE ER) 500 MG 24 hr tablet TAKE 1 TABLET(500 MG) BY MOUTH AT BEDTIME  Dulaglutide (TRULICITY) 4.5 PN/3.6RW SOPN Inject 4.5 mg as directed once a week.   ezetimibe (ZETIA) 10 MG tablet TAKE 1 TABLET(10 MG) BY MOUTH DAILY, pt. Must keep appointment with office on 02/07/21 to get further refills   furosemide (LASIX) 40 MG tablet Take 1 tablet (40 mg total) by mouth daily.   Insulin Aspart FlexPen (NOVOLOG) 100 UNIT/ML INJECT 15 UNITS UNDER THE SKIN WITH BREAKFAST AND 25 UNITS WITH SUPPER   insulin glargine (LANTUS SOLOSTAR) 100 UNIT/ML Solostar Pen Inject 100 Units into the skin every morning.   levothyroxine  (SYNTHROID, LEVOTHROID) 50 MCG tablet Take 1 tablet (50 mcg total) by mouth at bedtime.   ondansetron (ZOFRAN) 8 MG tablet TAKE 1 TABLET(8 MG) BY MOUTH EVERY 8 HOURS AS NEEDED FOR NAUSEA OR VOMITING   pantoprazole (PROTONIX) 40 MG tablet Take 1 tablet (40 mg total) by mouth daily.   pregabalin (LYRICA) 150 MG capsule Take 2 capsules (300 mg total) by mouth 2 (two) times daily.   protriptyline (VIVACTIL) 10 MG tablet Take 10 mg by mouth 3 (three) times daily.    rivaroxaban (XARELTO) 20 MG TABS tablet TAKE 1 TABLET(20 MG) BY MOUTH DAILY WITH SUPPER   tamsulosin (FLOMAX) 0.4 MG CAPS capsule Take 1-2 capsules (0.4-0.8 mg total) by mouth daily.   TRINTELLIX 20 MG TABS tablet Take 20 mg by mouth at bedtime.    TRIUMEQ 600-50-300 MG tablet TAKE 1 TABLET BY MOUTH DAILY   zolpidem (AMBIEN) 10 MG tablet Take 10 mg by mouth at bedtime.   No facility-administered encounter medications on file as of 02/19/2021.    Reviewed chart for medication changes ahead of medication coordination call.  No OVs, Consults, or hospital visits since last care coordination call/Pharmacist visit. (If appropriate, list visit date, provider name)  No medication changes indicated OR if recent visit, treatment plan here.  BP Readings from Last 3 Encounters:  02/07/21 (!) 160/82  02/04/21 128/66  01/09/21 (!) 140/50    Lab Results  Component Value Date   HGBA1C 7.2 (A) 01/09/2021     Have you seen any other providers since your last visit?   Any changes in your medications or health?   Any side effects from any medications?   Do you have an symptoms or problems not managed by your medications?   Any concerns about your health right now?   Has your provider asked that you check blood pressure, blood sugar, or follow special diet at home?   Do you get any type of exercise on a regular basis?   Can you think of a goal you would like to reach for your health?   Do you have any problems getting your  medications?   Is there anything that you would like to discuss during the appointment?   Please bring medications and supplements to appointment .    Care Gaps  AWV: needed  Colonoscopy: due 05/04/28 DM Eye Exam: overdue 02/05/21 DM Foot Exam: due 10/08/21 Microalbumin: done 12/05/20 HbgAIC: done 01/09/21 (7.2) DEXA: N/A Mammogram: N/A  Star Rating Drugs: Dulaglutide (TRULICITY) 4.5 ER/1.5QM SOPN - last filled 12/21/2020 84 days    Future Appointments  Date Time Provider [place]  02/20/2021  1:00 PM [facility] CCM PHARMACIST [facility] P[facility]  02/26/2021  1:45 PM Debbe Odea, PT OC-OPT None  02/27/2021 10:30 AM Dohmeier, Asencion Partridge, MD [facility]-GNA None  03/26/2021  4:00 PM [facility] LAB [facility]-RCID RCID  04/09/2021  4:00 PM Comer, Okey Regal, MD [facility]-RCID RCID  04/11/2021  2:00 PM Renato Shin, MD [facility]LBENDO None  08/09/2021  4:20 PM Skeet Latch, MD DWB-CVD DWB  08/29/2021  2:30 PM Ward Givens, NP GNA-GNA None   Multiple attempts were made to contact patient. Attempts were unsuccessful. / ls,CMA   Jobe Gibbon, [place] Pharmacist Assistant  [phone]

## 2021-02-20 ENCOUNTER — Ambulatory Visit (INDEPENDENT_AMBULATORY_CARE_PROVIDER_SITE_OTHER): Payer: PPO | Admitting: Pharmacist

## 2021-02-20 ENCOUNTER — Other Ambulatory Visit: Payer: Self-pay | Admitting: Cardiovascular Disease

## 2021-02-20 ENCOUNTER — Other Ambulatory Visit: Payer: Self-pay | Admitting: Family Medicine

## 2021-02-20 DIAGNOSIS — I824Y9 Acute embolism and thrombosis of unspecified deep veins of unspecified proximal lower extremity: Secondary | ICD-10-CM

## 2021-02-20 DIAGNOSIS — F331 Major depressive disorder, recurrent, moderate: Secondary | ICD-10-CM

## 2021-02-20 DIAGNOSIS — I10 Essential (primary) hypertension: Secondary | ICD-10-CM

## 2021-02-20 DIAGNOSIS — M25562 Pain in left knee: Secondary | ICD-10-CM

## 2021-02-20 DIAGNOSIS — Z794 Long term (current) use of insulin: Secondary | ICD-10-CM

## 2021-02-20 DIAGNOSIS — E039 Hypothyroidism, unspecified: Secondary | ICD-10-CM

## 2021-02-20 DIAGNOSIS — E785 Hyperlipidemia, unspecified: Secondary | ICD-10-CM

## 2021-02-20 DIAGNOSIS — E1142 Type 2 diabetes mellitus with diabetic polyneuropathy: Secondary | ICD-10-CM

## 2021-02-20 DIAGNOSIS — K219 Gastro-esophageal reflux disease without esophagitis: Secondary | ICD-10-CM

## 2021-02-20 NOTE — Patient Instructions (Addendum)
Visit Information   Goals Addressed             This Visit's Progress    Monitor and Manage My Blood Sugar-Diabetes Type 2       Timeframe:  Long-Range Goal Priority:  High Start Date:  02/20/21                           Expected End Date: 08/21/21                      Follow Up Date 05/21/21    - check blood sugar at prescribed times - check blood sugar if I feel it is too high or too low - enter blood sugar readings and medication or insulin into daily log - take the blood sugar log to all doctor visits    Why is this important?   Checking your blood sugar at home helps to keep it from getting very high or very low.  Writing the results in a diary or log helps the doctor know how to care for you.  Your blood sugar log should have the time, date and the results.  Also, write down the amount of insulin or other medicine that you take.  Other information, like what you ate, exercise done and how you were feeling, will also be helpful.     Notes: Work on limiting gatorades and ice cream!!!     Track and Manage My Blood Pressure-Hypertension       Timeframe:  Long-Range Goal Priority:  High Start Date:   02/20/21                          Expected End Date:   08/21/21                    Follow Up Date 05/21/21   - check blood pressure 3 times per week - choose a place to take my blood pressure (home, clinic or office, retail store) - write blood pressure results in a log or diary    Why is this important?   You won't feel high blood pressure, but it can still hurt your blood vessels.  High blood pressure can cause heart or kidney problems. It can also cause a stroke.  Making lifestyle changes like losing a little weight or eating less salt will help.  Checking your blood pressure at home and at different times of the day can help to control blood pressure.  If the doctor prescribes medicine remember to take it the way the doctor ordered.  Call the office if you cannot  afford the medicine or if there are questions about it.     Notes:        Patient Care Plan: General Pharmacy (Adult)     Problem Identified: HTN, GERD, Hypothyroidism, Neuropathy, MDD, HLD, Type II DM   Priority: High  Onset Date: 02/20/2021     Long-Range Goal: Patient-Specific Goal   Start Date: 02/20/2021  Expected End Date: 08/21/2021  This Visit's Progress: On track  Priority: High  Note:   Current Barriers:  Unable to achieve control of glucose/BP  Suboptimal therapeutic regimen for cholesterol  Pharmacist Clinical Goal(s):  Patient will achieve improvement in A1c and LDL as evidenced by follow up labs through collaboration with PharmD and provider.   Interventions: 1:1 collaboration with [name], MD regarding development and  update of comprehensive plan of care as evidenced by provider attestation and co-signature Inter-disciplinary care team collaboration (see longitudinal plan of care) Comprehensive medication review performed; medication list updated in electronic medical record  Hypertension (BP goal <130/80) -Uncontrolled -Current treatment: Amlodipine 7m daily -Medications previously tried: losartan  -Current home readings: 167/60-70 -Current dietary habits: high sodium intake, high carb and sugar intake - see DM for specifics -Denies hypotensive/hypertensive symptoms -Educated on BP goals and benefits of medications for prevention of heart attack, stroke and kidney damage; Exercise goal of 150 minutes per week; Importance of home blood pressure monitoring; -Counseled to monitor BP at home a few times per week, document, and provide log at future appointments -Recommended to continue current medication Previous history of losartan with unknown d/c. Recent elevated BP in office as well as at home.  Already having some fluid retention with swelling noted in feet - would be hesitant to increase amlodipine for this reason.  CMA to follow up on BP in  30 days and we will assess at that time before adding new agents.  Hyperlipidemia: (LDL goal < 70) -Uncontrolled -Current treatment: Zetia 158mdaily -Medications previously tried: atorvastatin (leg pain)  -Current dietary patterns: see DM -Current exercise habits: minimal -Educated on Cholesterol goals;  Benefits of statin for ASCVD risk reduction; Importance of limiting foods high in cholesterol; -Recommended to continue current medication Recommended patient come in for updated lipid panel (last was March 2021).  He does not remember any leg pain associated with Lipitor. If LDL remains elevated, would consider Crestor pending kidney labs or starting back Lipitor even at once or twice weekly to start and titrate as tolerated.  Diabetes w/neuropathy (A1c goal <7%) -Uncontrolled -Current medications: Trulicity 4.5 mgYF/7.4BSantus 100 units every morning Novolog 15 units with breakfast and 25 units with supper Invokana 30026mefore breakfast Lyrica 150m64mcapsules bid -Medications previously tried: metformin  -Current home glucose readings fasting glucose: 150s usually, 180 today post prandial glucose: 250 usually  -Reports hypoglycemic/hyperglycemic symptoms - mentions a few episodes of hypoglycemia per week where he will get dizzy and sweaty -Current meal patterns:  breakfast: N/A  lunch: frozen meals, ramen  dinner: frozen meals, ramen snacks: ice cream w/ pepsi float drinks: Regular gatorade, milk -Current exercise: minimal -Educated on A1c and blood sugar goals; Benefits of weight loss; Prevention and management of hypoglycemic episodes; Benefits of routine self-monitoring of blood sugar; -Counseled to check feet daily and get yearly eye exams -Counseled on diet and exercise extensively Recommended to continue current medication Currently I believe he could make big strides with simple improvements in his diet.  This would allow us tKoreafurther reduce insulin and prevent  hypoglycemia.  Counseled on importance of regular meals and eating with all doses of insulin. Today we set a goal to limit gatorades to one per day as well as work to limit our servings of ice cream floats.  Patient agreeable to plan Will f/u in 60 days to assess changes.  Depression/Anxiety (Goal: Minimize symptoms) -Controlled -Current treatment: Trintellix 20mg56mly Trintellix 5mg d29my Rexulti 1mg da83m Alprazolam 1mg prn53mvactil 10mg tid58mdications previously tried/failed: Pristiq -PHQ9:  PHQ9 SCORE ONLY 02/04/2021 12/27/2020 12/05/2020  PHQ-9 Total Score 11 11 7   -Sees Dr. Rupinder Chucky Mayr year for follow up. -Educated on Benefits of medication for symptom control -Recommended to continue current medication Would hesitate to make changes as he is seen by psychology - of note, Trintellix maximum dose is 20mg per53m  day - will consult with Dr. Toy Care to make sure she is aware of this dose.  GERD (Goal: Minimize symptoms) -Controlled -Current treatment  Pantoprazole 77m daily -Medications previously tried: none noted -Discussed appropriate timing of medication and difference between other options such as tums and Pepcid.  -Recommended to continue current medication Work on trigger foods and medication timing.  Hypothyroidism (Goal: Maintain TSH) -Controlled -Current treatment  Levothyroxine 559m daily -Medications previously tried: none noted -Most recent TSH was WNL - he takes this medication in the evening usually with milk.  -Recommended to continue current medication Counseled on appropriate timing of this medication, would hesitate to make big changes with TSH normal.  However if it continues to fluctuate he may need to make changes on medication timing to ensure adequate and consistent absorption.  HIV  -Controlled -Current treatment  Triumeq 600-50-300 daily -Medications previously tried: Stribild, GeJorje GuildAtripla  -Recommended to continue current  medication Assessed patient finances. [place] [facility] [place] Continue as prescribed.  Patient Goals/Self-Care Activities Patient will:  - take medications as prescribed as evidenced by patient report and record review check blood pressure a few times per week, document, and provide at future appointments engage in dietary modifications by limiting gatorades to one per day and work on cutting back on servings of ice cream  Follow Up Plan: The care management team will reach out to the patient again over the next 30 days.       Mr. BlBaswellas given information about Chronic Care Management services today including:  CCM service includes personalized support from designated clinical staff supervised by his physician, including individualized plan of care and coordination with other care providers 24/7 contact phone numbers for assistance for urgent and routine care needs. Standard insurance, coinsurance, copays and deductibles apply for chronic care management only during months in which we provide at least 20 minutes of these services. Most insurances cover these services at 100%, however patients may be responsible for any copay, coinsurance and/or deductible if applicable. This service may help you avoid the need for more expensive face-to-face services. Only one practitioner may furnish and bill the service in a calendar month. The patient may stop CCM services at any time (effective at the end of the month) by phone call to the office staff.  Patient agreed to services and verbal consent obtained.   The patient verbalized understanding of instructions, educational materials, and care plan provided today and agreed to receive a mailed copy of patient instructions, educational materials, and care plan.  Telephone follow up appointment with pharmacy team member scheduled for: 2 months  ChEdythe ClarityRPSingerPharmD Clinical  Pharmacist  [phone]

## 2021-02-21 NOTE — Telephone Encounter (Signed)
Patient is requesting a refill of the following medications: Requested Prescriptions   Pending Prescriptions Disp Refills   diclofenac Sodium (VOLTAREN) 1 % GEL [Pharmacy Med Name: DICLOFENAC 1% GEL 100GM] 300 g 0    Sig: APPLY 2 GM TO EACH KNEE EVERY MORNING AND EVERY NIGHT AT BEDTIME AND 1 GM TO EACH KNEE INI THE AFTERNOON   XARELTO 20 MG TABS tablet [Pharmacy Med Name: XARELTO 20MG TABLETS] 90 tablet 0    Sig: TAKE 1 TABLET(20 MG) BY MOUTH DAILY WITH SUPPER    Date of patient request: 02/20/2021 Last office visit: 02/04/21 Date of last refill: 01/15/21 Last refill amount: 300 g Follow up time period per chart: 3 months

## 2021-02-23 DIAGNOSIS — E039 Hypothyroidism, unspecified: Secondary | ICD-10-CM

## 2021-02-23 DIAGNOSIS — F331 Major depressive disorder, recurrent, moderate: Secondary | ICD-10-CM

## 2021-02-23 DIAGNOSIS — Z794 Long term (current) use of insulin: Secondary | ICD-10-CM

## 2021-02-23 DIAGNOSIS — E1122 Type 2 diabetes mellitus with diabetic chronic kidney disease: Secondary | ICD-10-CM | POA: Diagnosis not present

## 2021-02-23 DIAGNOSIS — I10 Essential (primary) hypertension: Secondary | ICD-10-CM | POA: Diagnosis not present

## 2021-02-23 DIAGNOSIS — N183 Chronic kidney disease, stage 3 unspecified: Secondary | ICD-10-CM | POA: Diagnosis not present

## 2021-02-23 DIAGNOSIS — E1142 Type 2 diabetes mellitus with diabetic polyneuropathy: Secondary | ICD-10-CM

## 2021-02-26 ENCOUNTER — Encounter: Payer: PPO | Admitting: Physical Therapy

## 2021-02-27 ENCOUNTER — Ambulatory Visit: Payer: PPO | Admitting: Neurology

## 2021-02-27 DIAGNOSIS — N179 Acute kidney failure, unspecified: Secondary | ICD-10-CM | POA: Diagnosis not present

## 2021-02-27 DIAGNOSIS — R399 Unspecified symptoms and signs involving the genitourinary system: Secondary | ICD-10-CM | POA: Diagnosis not present

## 2021-02-27 DIAGNOSIS — B20 Human immunodeficiency virus [HIV] disease: Secondary | ICD-10-CM | POA: Diagnosis not present

## 2021-02-27 DIAGNOSIS — E0849 Diabetes mellitus due to underlying condition with other diabetic neurological complication: Secondary | ICD-10-CM | POA: Diagnosis not present

## 2021-02-27 DIAGNOSIS — Z6836 Body mass index (BMI) 36.0-36.9, adult: Secondary | ICD-10-CM | POA: Diagnosis not present

## 2021-02-27 DIAGNOSIS — I10 Essential (primary) hypertension: Secondary | ICD-10-CM | POA: Diagnosis not present

## 2021-02-27 DIAGNOSIS — E876 Hypokalemia: Secondary | ICD-10-CM | POA: Diagnosis not present

## 2021-02-27 DIAGNOSIS — R55 Syncope and collapse: Secondary | ICD-10-CM | POA: Diagnosis not present

## 2021-02-27 DIAGNOSIS — Z794 Long term (current) use of insulin: Secondary | ICD-10-CM | POA: Diagnosis not present

## 2021-03-01 ENCOUNTER — Other Ambulatory Visit: Payer: Self-pay | Admitting: Gastroenterology

## 2021-03-01 DIAGNOSIS — Z1211 Encounter for screening for malignant neoplasm of colon: Secondary | ICD-10-CM | POA: Diagnosis not present

## 2021-03-01 DIAGNOSIS — K5900 Constipation, unspecified: Secondary | ICD-10-CM | POA: Diagnosis not present

## 2021-03-01 DIAGNOSIS — K7581 Nonalcoholic steatohepatitis (NASH): Secondary | ICD-10-CM

## 2021-03-05 ENCOUNTER — Telehealth: Payer: Self-pay | Admitting: Physical Therapy

## 2021-03-05 ENCOUNTER — Encounter: Payer: PPO | Admitting: Physical Therapy

## 2021-03-05 NOTE — Telephone Encounter (Signed)
Pt no show for PT appointment today. They were contacted and informed of this and he states he mixed up his days and has trouble with his memory. He says he still feels like he needs PT and will call our front desk to schedule another.   Elsie Ra, PT, DPT 03/05/21 3:09 PM

## 2021-03-07 ENCOUNTER — Other Ambulatory Visit: Payer: Self-pay

## 2021-03-07 ENCOUNTER — Encounter: Payer: Self-pay | Admitting: Physical Therapy

## 2021-03-07 ENCOUNTER — Ambulatory Visit: Payer: PPO | Admitting: Physical Therapy

## 2021-03-07 DIAGNOSIS — R2681 Unsteadiness on feet: Secondary | ICD-10-CM | POA: Diagnosis not present

## 2021-03-07 DIAGNOSIS — G8929 Other chronic pain: Secondary | ICD-10-CM

## 2021-03-07 DIAGNOSIS — R2689 Other abnormalities of gait and mobility: Secondary | ICD-10-CM

## 2021-03-07 DIAGNOSIS — M6281 Muscle weakness (generalized): Secondary | ICD-10-CM | POA: Diagnosis not present

## 2021-03-07 DIAGNOSIS — R29898 Other symptoms and signs involving the musculoskeletal system: Secondary | ICD-10-CM

## 2021-03-07 DIAGNOSIS — M545 Low back pain, unspecified: Secondary | ICD-10-CM | POA: Diagnosis not present

## 2021-03-07 NOTE — Therapy (Signed)
[facility] Physical Therapy [address] [place], [place], [address] Phone: [phone]   Fax:  [phone]  Physical Therapy Treatment  Patient Details  Name: Daniel [name] MRN: [id number] Date of Birth: [birth year] Referring Provider (PT): [name], MD   Encounter Date: 03/07/2021   PT End of Session - 03/07/21 1519     Visit Number 4    Number of Visits 12    Date for PT Re-Evaluation 04/04/21    PT Start Time 1303    PT Stop Time 1345    PT Time Calculation (min) 42 min    Activity Tolerance Patient tolerated treatment well    Behavior During Therapy [facility] for tasks assessed/performed             Past Medical History:  Diagnosis Date   ADHD (attention deficit hyperactivity disorder)    Anxiety    Chronic kidney disease    Clotting disorder ([place])    Depression    Diabetes mellitus without complication ([place])    Diabetes mellitus, type II ([place])    Dizziness 03/17/2015   Essential hypertension 06/25/2018   GERD (gastroesophageal reflux disease)    HIV disease ([place])    HIV infection ([place])    Liver disease    OSA (obstructive sleep apnea) 07/25/2015   Uses CPAP regularly   Peripheral vascular disease ([place])    Ulcer     Past Surgical History:  Procedure Laterality Date   AMPUTATION Right 10/02/2017   Procedure: RIGHT TRANSMETATARSAL AMPUTATION;  Surgeon: Leandrew Koyanagi, MD;  Location: [place];  Service: Orthopedics;  Laterality: Right;   SMALL INTESTINE SURGERY     STOMACH SURGERY     TOE AMPUTATION Right 08/2016   right great toe    There were no vitals filed for this visit.   Subjective Assessment - 03/07/21 1514     Subjective "I feel the same nothing has improved over the last 2 years". He admits to no compliance with HEP. He adamantly states he needs PT.    Pertinent History DM, HIV, HTN, anxiety/depression, CKD    How long can you walk comfortably? 100 feet    Patient Stated Goals get stronger and more mobile    Pain Onset More  than a month ago                [facility] PT Assessment - 03/07/21 0001       Assessment   Medical Diagnosis h/o fall chronick right LBP without sciatica    Referring Provider (PT) [name], MD    Onset Date/Surgical Date 06/30/20      Standardized Balance Assessment   Five times sit to stand comments  18      Berg Balance Test   Sit to Stand Able to stand without using hands and stabilize independently    Standing Unsupported Able to stand safely 2 minutes    Sitting with Back Unsupported but Feet Supported on Floor or Stool Able to sit safely and securely 2 minutes    Stand to Sit Sits safely with minimal use of hands    Transfers Able to transfer safely, minor use of hands    Standing Unsupported with Eyes Closed Able to stand 10 seconds with supervision    Standing Unsupported with Feet Together Able to place feet together independently and stand for 1 minute with supervision    From Standing, Reach Forward with Outstretched Arm Can reach forward >12 cm safely (5")    From  Standing Position, Pick up Object from Floor Able to pick up shoe, needs supervision    From Standing Position, Turn to Look Behind Over each Shoulder Looks behind from both sides and weight shifts well    Turn 360 Degrees Able to turn 360 degrees safely one side only in 4 seconds or less    Standing Unsupported, Alternately Place Feet on Step/Stool Able to complete 4 steps without aid or supervision    Standing Unsupported, One Foot in [place] help to step but can hold 15 seconds    Standing on One Leg Tries to lift leg/unable to hold 3 seconds but remains standing independently    Total Score 43      Timed Up and Go Test   Normal TUG (seconds) 14.6                           [facility] Adult PT Treatment/Exercise - 03/07/21 0001       Knee/Hip Exercises: Aerobic   Nustep L6 x 5 min                       PT Short Term Goals - 03/07/21 1335       PT SHORT TERM  GOAL #1   Title Patient to be compliant with correct performance of HEP, to be updated PRN     Baseline has not been compliant    Time 2    Period Weeks    Status Not Met    Target Date 01/14/21      PT SHORT TERM GOAL #2   Title Functional tasks assessed and appropriate goals set    Baseline assessed stairs and he has difficulty with this and needs bilateral UE support and close supervision    Time 2    Period Weeks    Status New      PT SHORT TERM GOAL #3   Title To be able to ascend stairs recpirocally with one rail    Time 4    Period Weeks    Status New    Target Date 04/04/21               PT Long Term Goals - 03/07/21 1341       PT LONG TERM GOAL #1   Title Patient ind and compliant in advanced HEP for strength and balance. Taget dates 4 weeks 04/04/21    Baseline he has not been compliant and was highly encouraged to do so    Time 6    Period Weeks    Status On-going      PT LONG TERM GOAL #2   Title Patient to improve TUG to < 10 seconds to decrease fall risk.    Baseline 14.6 now    Time 6    Period Weeks    Status On-going      PT LONG TERM GOAL #3   Title Patient to decrease 5x sit to stand time to 11 seconds or less to decrease fall risk    Baseline became worse to 18 sec now    Time 6    Period Weeks    Status On-going      PT LONG TERM GOAL #4   Title Patient to demonstrate Rt SLS time of 9 seconds or greater to decrease fall risk    Baseline 4 sec    Time 6    Period Weeks  Status On-going      PT LONG TERM GOAL #5   Title Patient able to safely ambulate on level and unlevel surfaces and up and down curbs/inclines without LOB using lease restrictive AD    Baseline needs close supervision and AD is recommended    Time 6    Period Weeks    Status On-going                   Plan - 03/07/21 1520     Clinical Impression Statement His PT plan of care has expired and he has not been to PT since 01/10/21. I retested his balance  tests and assessed goals and he as not made any progress overall due to no compliance with HEP and he has not attended PT. His balance tests place him at a high risk of falling and he remains unsteady on his feet. I recommended he use assistive device to reduce risk of falling but he refueses. At this point I will recert him for PT one last time for 4 weeks and if no progress or lack of attendance we will discharge then. I did provide him with udpated HEP to include gym activities as he states he plans to return to gym eventually.    Personal Factors and Comorbidities Age;Behavior Pattern;Comorbidity 3+;Fitness    Comorbidities 5 digit amputation right foot; CKD, low back pain, HTN, HIV, DM    Examination-Activity Limitations Locomotion Level;Stairs    Stability/Clinical Decision Making Stable/Uncomplicated    Rehab Potential Excellent    PT Frequency 2x / week    PT Duration 6 weeks    PT Treatment/Interventions ADLs/Self Care Home Management;Aquatic Therapy;Cryotherapy;Electrical Stimulation;Moist Heat;Neuromuscular re-education;Therapeutic exercise;Therapeutic activities;Balance training;Functional mobility training;Stair training;Gait training;Patient/family education;Manual techniques    PT Next Visit Plan balance activities, general strengthening    PT Home Exercise Plan [email or web address]    Consulted and Agree with Plan of Care Patient             Patient will benefit from skilled therapeutic intervention in order to improve the following deficits and impairments:  Abnormal gait, Decreased range of motion, Pain, Decreased balance, Impaired flexibility, Postural dysfunction, Decreased strength, Difficulty walking, Obesity  Visit Diagnosis: Muscle weakness (generalized)  Other abnormalities of gait and mobility  Unsteadiness on feet  Chronic bilateral low back pain, unspecified whether sciatica present  Other symptoms and signs involving the musculoskeletal system     Problem  List Patient Active Problem List   Diagnosis Date Noted   Statin myopathy 02/12/2021   Unilateral primary osteoarthritis, right knee 12/15/2019   Generalized weakness 09/14/2019   Severe major depression ([place]) 11/17/2018   Class 2 obesity due to excess calories without serious comorbidity with body mass index (BMI) of 36.0 to 36.9 in adult 11/17/2018   Anticoagulated 08/24/2018   Dyslipidemia 07/27/2018   Essential hypertension 06/25/2018   Hypogonadism in male 05/10/2018   Numbness 03/31/2018   Diabetic peripheral neuropathy ([place]) 01/25/2018   History of syncope 01/25/2018   S/P transmetatarsal amputation of foot, right ([place]) 10/09/2017   Hypothyroidism    Constipation    History of DVT (deep vein thrombosis) 09/30/2017   Obesity, Class III, BMI 40-49.9 (morbid obesity) ([place]) 01/15/2017   DOE (dyspnea on exertion) 01/13/2017   Chronic migraine 09/17/2016   Gait abnormality 09/17/2016   Fall 12/05/2015   Toe ulcer, right ([place]) 09/19/2015   Decreased pedal pulses 09/19/2015   OSA on CPAP 09/05/2015   Major depressive disorder, recurrent episode, moderate ([place])  09/05/2015   Low back pain 06/11/2015   Abnormality of gait 06/11/2015   Chronic renal insufficiency, stage III (moderate) ([place]) 08/09/2014   Insulin-requiring or dependent type II diabetes mellitus ([place]) 11/07/2013   Hepatic steatosis 09/09/2010   Human immunodeficiency virus (HIV) disease ([place]) 06/04/2006   Depression 06/04/2006   THROMBOPHLEBITIS NOS 06/04/2006   GERD 06/04/2006   ARTHRITIS, HAND 06/04/2006    Debbe Odea, PT,DPT 03/07/2021, 3:25 PM  [facility] Physical Therapy [address] [place], [place], [address] Phone: [phone]   Fax:  [phone]  Name: Daniel [name] MRN: [id number] Date of Birth: 16-Oct-1955

## 2021-03-07 NOTE — Patient Instructions (Signed)
Access Code: [email or web address] URL: [email or web address] Date: 03/07/2021 Prepared by: Elsie Ra  Exercises Single Leg Stance with Support - 1 x daily - 7 x weekly - 1 sets - 5 reps - max hold Standing Romberg to 1/2 Tandem Stance - 1 x daily - 7 x weekly - 1 sets - 5 reps - max hold Seated Straight Leg Raise - 1 x daily - 7 x weekly - 3 sets - 10 reps Sit to Stand - 2 x daily - 7 x weekly - 1 sets - 10 reps Supine Bridge - 2 x daily - 7 x weekly - 1 sets - 10 reps - 5 sec hold Backward Walking with Counter Support - 2 x daily - 6 x weekly - 1 sets - 3 reps Recumbent Bike - 2 x daily - 6 x weekly - 1 sets - 10-20 minutes hold Full Leg Press - 2 x daily - 6 x weekly - 2-3 sets - 10 reps Single Leg Knee Extension with Weight Machine - 2 x daily - 6 x weekly - 2-3 sets - 10 reps

## 2021-03-14 ENCOUNTER — Ambulatory Visit: Payer: PPO | Admitting: Physical Therapy

## 2021-03-14 ENCOUNTER — Other Ambulatory Visit: Payer: Self-pay

## 2021-03-14 DIAGNOSIS — M545 Low back pain, unspecified: Secondary | ICD-10-CM | POA: Diagnosis not present

## 2021-03-14 DIAGNOSIS — M6281 Muscle weakness (generalized): Secondary | ICD-10-CM

## 2021-03-14 DIAGNOSIS — R2681 Unsteadiness on feet: Secondary | ICD-10-CM | POA: Diagnosis not present

## 2021-03-14 DIAGNOSIS — G8929 Other chronic pain: Secondary | ICD-10-CM | POA: Diagnosis not present

## 2021-03-14 DIAGNOSIS — R2689 Other abnormalities of gait and mobility: Secondary | ICD-10-CM | POA: Diagnosis not present

## 2021-03-14 NOTE — Therapy (Addendum)
[facility] Physical Therapy [address] [place], [place], [address] Phone: [phone]   Fax:  [phone]  Physical Therapy Treatment/Discharge addendum PHYSICAL THERAPY DISCHARGE SUMMARY  Visits from Start of Care: 5  Current functional level related to goals / functional outcomes: See notes   Remaining deficits: See notes   Education / Equipment: HEP  Plan: Patient agrees to discharge.  Patient goals were not met. Patient is being discharged due to not returning since last visit        Patient Details  Name: Daniel [name] MRN: [id number] Date of Birth: Jul 11, 1955 Referring Provider (PT): Meredith Staggers, MD   Encounter Date: 03/14/2021   PT End of Session - 03/14/21 1307     Visit Number 5    Number of Visits 12    Date for PT Re-Evaluation 04/04/21    PT Start Time 1130    PT Stop Time 1210    PT Time Calculation (min) 40 min    Activity Tolerance Patient tolerated treatment well    Behavior During Therapy [facility] for tasks assessed/performed             Past Medical History:  Diagnosis Date   ADHD (attention deficit hyperactivity disorder)    Anxiety    Chronic kidney disease    Clotting disorder ([name])    Depression    Diabetes mellitus without complication ([name])    Diabetes mellitus, type II ([name])    Dizziness 03/17/2015   Essential hypertension 06/25/2018   GERD (gastroesophageal reflux disease)    HIV disease ([name])    HIV infection ([name])    Liver disease    OSA (obstructive sleep apnea) 07/25/2015   Uses CPAP regularly   Peripheral vascular disease ([name])    Ulcer     Past Surgical History:  Procedure Laterality Date   AMPUTATION Right 10/02/2017   Procedure: RIGHT TRANSMETATARSAL AMPUTATION;  Surgeon: Tarry Kos, MD;  [facility];  Service: Orthopedics;  Laterality: Right;   SMALL INTESTINE SURGERY     STOMACH SURGERY     TOE AMPUTATION Right 08/2016   right great toe    There were no vitals filed for this  visit.   Subjective Assessment - 03/14/21 1222     Subjective he relays no compliance to HEP and has not gone to the gym.    Pertinent History DM, HIV, HTN, anxiety/depression, CKD    How long can you walk comfortably? 100 feet    Patient Stated Goals get stronger and more mobile    Pain Onset More than a month ago              [facility] Adult PT Treatment/Exercise - 03/14/21 0001       Neuro Re-ed    Neuro Re-ed Details  tandem balance 30 sec X 3 bilat, walking backwards and then walking fwd with head turns X 3 round trips at counter top without UE support      Knee/Hip Exercises: Aerobic   Nustep L6 X 8 min UE/LE      Knee/Hip Exercises: Machines for Strengthening   Cybex Knee Extension DL 25# 3G64    Cybex Knee Flexion DL 40#3K74    Total Gym Leg Press DL 25# 9D63    Other Machine row machine 25 lbs 2X10, lat pull down 20# 2X10, chest press 20# 2X10                       PT Short Term  Goals - 03/07/21 1335       PT SHORT TERM GOAL #1   Title Patient to be compliant with correct performance of HEP, to be updated PRN     Baseline has not been compliant    Time 2    Period Weeks    Status Not Met    Target Date 01/14/21      PT SHORT TERM GOAL #2   Title Functional tasks assessed and appropriate goals set    Baseline assessed stairs and he has difficulty with this and needs bilateral UE support and close supervision    Time 2    Period Weeks    Status New      PT SHORT TERM GOAL #3   Title To be able to ascend stairs recpirocally with one rail    Time 4    Period Weeks    Status New    Target Date 04/04/21               PT Long Term Goals - 03/07/21 1341       PT LONG TERM GOAL #1   Title Patient ind and compliant in advanced HEP for strength and balance. Taget dates 4 weeks 04/04/21    Baseline he has not been compliant and was highly encouraged to do so    Time 6    Period Weeks    Status On-going      PT LONG TERM GOAL #2   Title  Patient to improve TUG to < 10 seconds to decrease fall risk.    Baseline 14.6 now    Time 6    Period Weeks    Status On-going      PT LONG TERM GOAL #3   Title Patient to decrease 5x sit to stand time to 11 seconds or less to decrease fall risk    Baseline became worse to 18 sec now    Time 6    Period Weeks    Status On-going      PT LONG TERM GOAL #4   Title Patient to demonstrate Rt SLS time of 9 seconds or greater to decrease fall risk    Baseline 4 sec    Time 6    Period Weeks    Status On-going      PT LONG TERM GOAL #5   Title Patient able to safely ambulate on level and unlevel surfaces and up and down curbs/inclines without LOB using lease restrictive AD    Baseline needs close supervision and AD is recommended    Time 6    Period Weeks    Status On-going                   Plan - 03/14/21 1308     Clinical Impression Statement Session focused on showing him gym equipment he can use at the gym and he shows good return demonstration with this. He did show good effort today with his general strengthening and balance exercises however he does not perform any of the HEP that was prescribed to him. I again explained the importance of exercise outside of PT and if he does not comply he will not make significant progress in his overall function. If he does not make progress we will have to discharge at the end of his PT POC in 3 more weeks.    Personal Factors and Comorbidities Age;Behavior Pattern;Comorbidity 3+;Fitness    Comorbidities 5 digit amputation right foot; CKD, low  back pain, HTN, HIV, DM    Examination-Activity Limitations Locomotion Level;Stairs    Stability/Clinical Decision Making Stable/Uncomplicated    Rehab Potential Excellent    PT Frequency 2x / week    PT Duration 6 weeks    PT Treatment/Interventions ADLs/Self Care Home Management;Aquatic Therapy;Cryotherapy;Electrical Stimulation;Moist Heat;Neuromuscular re-education;Therapeutic  exercise;Therapeutic activities;Balance training;Functional mobility training;Stair training;Gait training;Patient/family education;Manual techniques    PT Next Visit Plan balance activities, general strengthening, how is HEP compliance    PT Home Exercise Plan [email or web address]    Consulted and Agree with Plan of Care Patient             Patient will benefit from skilled therapeutic intervention in order to improve the following deficits and impairments:  Abnormal gait, Decreased range of motion, Pain, Decreased balance, Impaired flexibility, Postural dysfunction, Decreased strength, Difficulty walking, Obesity  Visit Diagnosis: Muscle weakness (generalized)  Other abnormalities of gait and mobility  Unsteadiness on feet  Chronic bilateral low back pain, unspecified whether sciatica present     Problem List Patient Active Problem List   Diagnosis Date Noted   Statin myopathy 02/12/2021   Unilateral primary osteoarthritis, right knee 12/15/2019   Generalized weakness 09/14/2019   Severe major depression ([name]) 11/17/2018   Class 2 obesity due to excess calories without serious comorbidity with body mass index (BMI) of 36.0 to 36.9 in adult 11/17/2018   Anticoagulated 08/24/2018   Dyslipidemia 07/27/2018   Essential hypertension 06/25/2018   Hypogonadism in male 05/10/2018   Numbness 03/31/2018   Diabetic peripheral neuropathy ([name]) 01/25/2018   History of syncope 01/25/2018   S/P transmetatarsal amputation of foot, right ([name]) 10/09/2017   Hypothyroidism    Constipation    History of DVT (deep vein thrombosis) 09/30/2017   Obesity, Class III, BMI 40-49.9 (morbid obesity) ([name]) 01/15/2017   DOE (dyspnea on exertion) 01/13/2017   Chronic migraine 09/17/2016   Gait abnormality 09/17/2016   Fall 12/05/2015   Toe ulcer, right ([name]) 09/19/2015   Decreased pedal pulses 09/19/2015   OSA on CPAP 09/05/2015   Major depressive disorder, recurrent episode, moderate ([name]) 09/05/2015    Low back pain 06/11/2015   Abnormality of gait 06/11/2015   Chronic renal insufficiency, stage III (moderate) ([name]) 08/09/2014   Insulin-requiring or dependent type II diabetes mellitus ([name]) 11/07/2013   Hepatic steatosis 09/09/2010   Human immunodeficiency virus (HIV) disease ([name]) 06/04/2006   Depression 06/04/2006   THROMBOPHLEBITIS NOS 06/04/2006   GERD 06/04/2006   ARTHRITIS, HAND 06/04/2006    April Manson, PT,DPT 03/14/2021, 1:14 PM  [facility] Physical Therapy [address] [place], [place], [address] Phone: [phone]   Fax:  [phone]  Name: KRAVEN COOPRIDER MRN: [id number] Date of Birth: 1955/03/18

## 2021-03-15 ENCOUNTER — Other Ambulatory Visit: Payer: PPO

## 2021-03-21 ENCOUNTER — Encounter: Payer: PPO | Admitting: Physical Therapy

## 2021-03-21 ENCOUNTER — Telehealth: Payer: Self-pay | Admitting: Physical Therapy

## 2021-03-21 NOTE — Telephone Encounter (Signed)
Pt no show for PT appointment today. They were contacted and informed of this via voicemail. They were provided the date and time of their next appointment on voicemail. They were instructed to call us to let us know if they cannot make their appointment.  Elsie Ra, PT, DPT 03/21/21 3:05 PM

## 2021-03-22 ENCOUNTER — Other Ambulatory Visit: Payer: Self-pay | Admitting: Endocrinology

## 2021-03-22 ENCOUNTER — Telehealth: Payer: Self-pay | Admitting: Endocrinology

## 2021-03-22 ENCOUNTER — Other Ambulatory Visit: Payer: Self-pay

## 2021-03-22 DIAGNOSIS — Z794 Long term (current) use of insulin: Secondary | ICD-10-CM

## 2021-03-22 DIAGNOSIS — N1831 Chronic kidney disease, stage 3a: Secondary | ICD-10-CM

## 2021-03-22 MED ORDER — FREESTYLE LIBRE 2 READER DEVI: Status: DC

## 2021-03-22 MED ORDER — FREESTYLE LIBRE 2 READER DEVI: 1.0000 | Freq: Once | 1 refills | Status: AC

## 2021-03-22 NOTE — Telephone Encounter (Signed)
Spoke with pt and Rx has been sent.

## 2021-03-22 NOTE — Telephone Encounter (Signed)
Patient called having issues with the Elenor [place] and needs assistance. Please call [phone].

## 2021-03-25 ENCOUNTER — Ambulatory Visit
Admission: RE | Admit: 2021-03-25 | Discharge: 2021-03-25 | Disposition: A | Discharge status: Home / Self Care | Payer: PPO | Source: Ambulatory Visit | Attending: Gastroenterology | Admitting: Gastroenterology

## 2021-03-25 DIAGNOSIS — K7581 Nonalcoholic steatohepatitis (NASH): Secondary | ICD-10-CM | POA: Diagnosis not present

## 2021-03-25 DIAGNOSIS — K760 Fatty (change of) liver, not elsewhere classified: Secondary | ICD-10-CM | POA: Diagnosis not present

## 2021-03-26 ENCOUNTER — Other Ambulatory Visit: Payer: PPO

## 2021-03-26 ENCOUNTER — Other Ambulatory Visit: Payer: Self-pay

## 2021-03-26 DIAGNOSIS — E785 Hyperlipidemia, unspecified: Secondary | ICD-10-CM | POA: Diagnosis not present

## 2021-03-26 DIAGNOSIS — I10 Essential (primary) hypertension: Secondary | ICD-10-CM | POA: Diagnosis not present

## 2021-03-26 DIAGNOSIS — Z113 Encounter for screening for infections with a predominantly sexual mode of transmission: Secondary | ICD-10-CM

## 2021-03-26 DIAGNOSIS — B20 Human immunodeficiency virus [HIV] disease: Secondary | ICD-10-CM

## 2021-03-27 ENCOUNTER — Other Ambulatory Visit: Payer: Self-pay

## 2021-03-27 DIAGNOSIS — Z794 Long term (current) use of insulin: Secondary | ICD-10-CM

## 2021-03-27 DIAGNOSIS — E1122 Type 2 diabetes mellitus with diabetic chronic kidney disease: Secondary | ICD-10-CM

## 2021-03-27 MED ORDER — FREESTYLE LIBRE 2 READER DEVI: 2 refills | Status: DC

## 2021-03-28 ENCOUNTER — Other Ambulatory Visit: Payer: Self-pay

## 2021-03-28 ENCOUNTER — Encounter: Payer: PPO | Admitting: Physical Therapy

## 2021-03-28 DIAGNOSIS — E785 Hyperlipidemia, unspecified: Secondary | ICD-10-CM

## 2021-03-29 LAB — COMPREHENSIVE METABOLIC PANEL
ALT: 43 IU/L (ref 0–44)
AST: 31 IU/L (ref 0–40)
Albumin/Globulin Ratio: 1.9 (ref 1.2–2.2)
Albumin: 4.6 g/dL (ref 3.8–4.8)
Alkaline Phosphatase: 121 IU/L (ref 44–121)
BUN/Creatinine Ratio: 17 (ref 10–24)
BUN: 28 mg/dL — ABNORMAL HIGH (ref 8–27)
Bilirubin Total: 0.6 mg/dL (ref 0.0–1.2)
CO2: 25 mmol/L (ref 20–29)
Calcium: 9.7 mg/dL (ref 8.6–10.2)
Chloride: 98 mmol/L (ref 96–106)
Creatinine, Ser: 1.68 mg/dL — ABNORMAL HIGH (ref 0.76–1.27)
Globulin, Total: 2.4 g/dL (ref 1.5–4.5)
Glucose: 135 mg/dL — ABNORMAL HIGH (ref 70–99)
Potassium: 4 mmol/L (ref 3.5–5.2)
Sodium: 137 mmol/L (ref 134–144)
Total Protein: 7 g/dL (ref 6.0–8.5)
eGFR: 44 mL/min/{1.73_m2} — ABNORMAL LOW (ref >59)

## 2021-03-29 LAB — LIPID PANEL
Chol/HDL Ratio: 5.5 ratio — ABNORMAL HIGH (ref 0.0–5.0)
Cholesterol, Total: 215 mg/dL — ABNORMAL HIGH (ref 100–199)
HDL: 39 mg/dL — ABNORMAL LOW (ref >39)
LDL Chol Calc (NIH): 136 mg/dL — ABNORMAL HIGH (ref 0–99)
Triglycerides: 222 mg/dL — ABNORMAL HIGH (ref 0–149)
VLDL Cholesterol Cal: 40 mg/dL (ref 5–40)

## 2021-04-04 ENCOUNTER — Other Ambulatory Visit (HOSPITAL_COMMUNITY): Payer: Self-pay

## 2021-04-04 ENCOUNTER — Encounter: Payer: PPO | Admitting: Physical Therapy

## 2021-04-05 ENCOUNTER — Telehealth: Payer: Self-pay | Admitting: Physician Assistant

## 2021-04-05 NOTE — Telephone Encounter (Signed)
Patient's phone call was transferred to preop clearance APP.  Talking with the patient, he actually called regarding his recent lab result.  I have discussed his recent lab result with him.  Renal function is not much changed, creatinine may be slightly worse at 1.6.  His renal function is currently being monitored by his nephrologist.   Annamary Carolin, my colleague has referred the patient to lipid clinic to consider alternative to the statins due to uncontrolled cholesterol.  Patient is aware of that as well.

## 2021-04-08 NOTE — Telephone Encounter (Signed)
We received a fax from [facility] stating they have made numerous attempts to the contact the patient to get him set-up with therapy and now they are setting him as unable to contact.   I called the pt and LVM asking for call back. Also sent him a [employer].

## 2021-04-08 NOTE — Telephone Encounter (Signed)
Thank you Isaac Laud! Sorry that came back to you.

## 2021-04-09 ENCOUNTER — Encounter: Payer: Self-pay | Admitting: Internal Medicine

## 2021-04-09 ENCOUNTER — Other Ambulatory Visit: Payer: Self-pay

## 2021-04-09 ENCOUNTER — Ambulatory Visit: Payer: PPO | Admitting: Internal Medicine

## 2021-04-09 VITALS — BP 111/75 | HR 95 | Resp 16 | Ht 70.0 in | Wt 255.0 lb

## 2021-04-09 DIAGNOSIS — B20 Human immunodeficiency virus [HIV] disease: Secondary | ICD-10-CM | POA: Diagnosis not present

## 2021-04-09 DIAGNOSIS — K760 Fatty (change of) liver, not elsewhere classified: Secondary | ICD-10-CM

## 2021-04-09 DIAGNOSIS — G4733 Obstructive sleep apnea (adult) (pediatric): Secondary | ICD-10-CM | POA: Diagnosis not present

## 2021-04-10 ENCOUNTER — Ambulatory Visit (INDEPENDENT_AMBULATORY_CARE_PROVIDER_SITE_OTHER): Payer: PPO | Admitting: Family Medicine

## 2021-04-10 ENCOUNTER — Encounter: Payer: Self-pay | Admitting: Internal Medicine

## 2021-04-10 VITALS — BP 136/78 | HR 78 | Temp 98.0°F | Resp 16 | Ht 70.0 in | Wt 258.4 lb

## 2021-04-10 DIAGNOSIS — Z9181 History of falling: Secondary | ICD-10-CM

## 2021-04-10 DIAGNOSIS — R0789 Other chest pain: Secondary | ICD-10-CM

## 2021-04-10 DIAGNOSIS — E1142 Type 2 diabetes mellitus with diabetic polyneuropathy: Secondary | ICD-10-CM | POA: Diagnosis not present

## 2021-04-10 NOTE — Assessment & Plan Note (Signed)
Recent LFTs wnl.

## 2021-04-10 NOTE — Progress Notes (Signed)
° °  Subjective:    Patient ID: Daniel [name], male    DOB: 11/02/1955, 66 y.o.   MRN: [id number]  HPI Here for follow up of HIV He continues on Triumeq and denies any missed doses.  No labs prior to the visit.  No issues with getting, taking or tolerating the medication.  Has had a recent lipid panel, CMP.     Review of Systems  Constitutional:  Negative for fatigue.  Gastrointestinal:  Negative for diarrhea and nausea.  Skin:  Negative for rash.      Objective:   Physical Exam Eyes:     General: No scleral icterus. Pulmonary:     Effort: Pulmonary effort is normal.  Skin:    Findings: No rash.  Neurological:     General: No focal deficit present.     Mental Status: He is alert.  Psychiatric:        Mood and Affect: Mood normal.   SH: no tobacco       Assessment & Plan:

## 2021-04-10 NOTE — Patient Instructions (Addendum)
I recommend discussing your falls with neurology. I still recommend using a cane or walker and walking slower for now to lessen risk of tripping or falling.  Let me know if I can place an order to occupational therapy to help with risk of falls.   If chest wall pain not improving into next week, let me know and I will order an xray.   With recent falls and your other meds, I do not feel comfortable prescribing medication like Viagra at this time. If stability improves and falls lessen can discuss these meds further with myself, cardiology, or urology.   Return to the clinic or go to the nearest emergency room if any of your symptoms worsen or new symptoms occur.   Fall Prevention in the Home, Adult Falls can cause injuries and affect people of all ages. There are many simple things that you can do to make your home safe and to help prevent falls. Ask for help when making these changes, if needed. What actions can I take to prevent falls? General instructions Use good lighting in all rooms. Replace any light bulbs that burn out, turn on lights if it is dark, and use night-lights. Place frequently used items in easy-to-reach places. Lower the shelves around your home if necessary. Set up furniture so that there are clear paths around it. Avoid moving your furniture around. Remove throw rugs and other tripping hazards from the floor. Avoid walking on wet floors. Fix any uneven floor surfaces. Add color or contrast paint or tape to grab bars and handrails in your home. Place contrasting color strips on the first and last steps of staircases. When you use a stepladder, make sure that it is completely opened and that the sides and supports are firmly locked. Have someone hold the ladder while you are using it. Do not climb a closed stepladder. Know where your pets are when moving through your home. What can I do in the bathroom?   Keep the floor dry. Immediately clean up any water that is on the  floor. Remove soap buildup in the tub or shower regularly. Use nonskid mats or decals on the floor of the tub or shower. Attach bath mats securely with double-sided, nonslip rug tape. If you need to sit down while you are in the shower, use a plastic, nonslip stool. Install grab bars by the toilet and in the tub and shower. Do not use towel bars as grab bars. What can I do in the bedroom? Make sure that a bedside light is easy to reach. Do not use oversized bedding that reaches the floor. Have a firm chair that has side arms to use for getting dressed. What can I do in the kitchen? Clean up any spills right away. If you need to reach for something above you, use a sturdy step stool that has a grab bar. Keep electrical cables out of the way. Do not use floor polish or wax that makes floors slippery. If you must use wax, make sure that it is non-skid floor wax. What can I do with my stairs? Do not leave any items on the stairs. Make sure that you have a light switch at the top and the bottom of the stairs. Have them installed if you do not have them. Make sure that there are handrails on both sides of the stairs. Fix handrails that are broken or loose. Make sure that handrails are as long as the staircases. Install non-slip stair treads on all  stairs in your home. Avoid having throw rugs at the top or bottom of stairs, or secure the rugs with carpet tape to prevent them from moving. Choose a carpet design that does not hide the edge of steps on the stairs. Check any carpeting to make sure that it is firmly attached to the stairs. Fix any carpet that is loose or worn. What can I do on the outside of my home? Use bright outdoor lighting. Regularly repair the edges of walkways and driveways and fix any cracks. Remove high doorway thresholds. Trim any shrubbery on the main path into your home. Regularly check that handrails are securely fastened and in good repair. Both sides of all steps  should have handrails. Install guardrails along the edges of any raised decks or porches. Clear walkways of debris and clutter, including tools and rocks. Have leaves, snow, and ice cleared regularly. Use sand or salt on walkways during winter months. In the garage, clean up any spills right away, including grease or oil spills. What other actions can I take? Wear closed-toe shoes that fit well and support your feet. Wear shoes that have rubber soles or low heels. Use mobility aids as needed, such as canes, walkers, scooters, and crutches. Review your medicines with your health care provider. Some medicines can cause dizziness or changes in blood pressure, which increase your risk of falling. Talk with your health care provider about other ways that you can decrease your risk of falls. This may include working with a physical therapist or trainer to improve your strength, balance, and endurance. Where to find more information Centers for Disease Control and Prevention, [place]: http://www.wolf.info/ National Institute on Aging: http://kim-miller.com/ Contact a health care provider if: You are afraid of falling at home. You feel weak, drowsy, or dizzy at home. You fall at home. Summary There are many simple things that you can do to make your home safe and to help prevent falls. Ways to make your home safe include removing tripping hazards and installing grab bars in the bathroom. Ask for help when making these changes in your home. This information is not intended to replace advice given to you by your health care provider. Make sure you discuss any questions you have with your health care provider. Document Revised: 09/14/2019 Document Reviewed: 09/14/2019 Elsevier Patient Education  [place].

## 2021-04-10 NOTE — Assessment & Plan Note (Signed)
He is doing well on Triumeq and no changes indicated.  Labs today and he can continue with yearly follow up.
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
## 2021-04-11 ENCOUNTER — Ambulatory Visit (INDEPENDENT_AMBULATORY_CARE_PROVIDER_SITE_OTHER): Payer: PPO | Admitting: Endocrinology

## 2021-04-11 ENCOUNTER — Other Ambulatory Visit: Payer: Self-pay

## 2021-04-11 VITALS — BP 100/60 | HR 84 | Ht 70.0 in | Wt 260.8 lb

## 2021-04-11 DIAGNOSIS — N1831 Chronic kidney disease, stage 3a: Secondary | ICD-10-CM

## 2021-04-11 DIAGNOSIS — Z794 Long term (current) use of insulin: Secondary | ICD-10-CM | POA: Diagnosis not present

## 2021-04-11 DIAGNOSIS — E1122 Type 2 diabetes mellitus with diabetic chronic kidney disease: Secondary | ICD-10-CM | POA: Diagnosis not present

## 2021-04-11 LAB — T-HELPER CELLS (CD4) COUNT (NOT AT ARMC)
Absolute CD4: 736 cells/uL (ref 490–1740)
CD4 T Helper %: 37 % (ref 30–61)
Total lymphocyte count: 1970 cells/uL (ref 850–3900)

## 2021-04-11 LAB — HIV-1 RNA QUANT-NO REFLEX-BLD
HIV 1 RNA Quant: <20 Copies/mL — ABNORMAL HIGH
HIV-1 RNA Quant, Log: <1.3 Log cps/mL — ABNORMAL HIGH

## 2021-04-11 LAB — POCT GLYCOSYLATED HEMOGLOBIN (HGB A1C): Hemoglobin A1C: 7 % — AB (ref 4.0–5.6)

## 2021-04-11 MED ORDER — FREESTYLE LIBRE 14 DAY SENSOR MISC: 3 refills | Status: DC

## 2021-04-11 NOTE — Progress Notes (Signed)
Subjective:    Patient ID: Daniel [name], male    DOB: 03/08/1955, 66 y.o.   MRN: [id number]  HPI Pt returns for f/u of diabetes mellitus:  DM type: Insulin-requiring type 2.  Dx'ed: 9924 Complications: PN, PAD, CRI, and foot ulcers.   Therapy: insulin since 2683, Trulicity, and Invokana.   DKA: never Severe hypoglycemia: never.  Pancreatitis: never.   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   Other: he takes multiple daily injections, but basal insulin is emphasized, with improved results; he is retired; he eats meals at [employer] and 1[employer].   Interval history: he has not recently used continuous glucose monitor, as he says it does not work.  He takes meds as rx'ed He also has chronic primary hypothyroidism.  He takes synthroid as rx'ed.   Past Medical History:  Diagnosis Date   ADHD (attention deficit hyperactivity disorder)    Anxiety    Chronic kidney disease    Clotting disorder ([place])    Depression    Diabetes mellitus without complication ([place])    Diabetes mellitus, type II ([facility])    Dizziness 03/17/2015   Essential hypertension 06/25/2018   GERD (gastroesophageal reflux disease)    HIV disease ([name])    HIV infection ([name])    Liver disease    OSA (obstructive sleep apnea) 07/25/2015   Uses CPAP regularly   Peripheral vascular disease ([place])    Ulcer     Past Surgical History:  Procedure Laterality Date   AMPUTATION Right 10/02/2017   Procedure: RIGHT TRANSMETATARSAL AMPUTATION;  Surgeon: Leandrew Koyanagi, MD;  Location: [facility];  Service: Orthopedics;  Laterality: Right;   SMALL INTESTINE SURGERY     STOMACH SURGERY     TOE AMPUTATION Right 08/2016   right great toe    Social History   Socioeconomic History   Marital status: Single    Spouse name: Not on file   Number of children: Not on file   Years of education: Not on file   Highest education level: Not on file  Occupational History    Comment: DISABILITY  Tobacco Use   Smoking  status: Former    Packs/day: 0.10    Years: 10.00    Pack years: 1.00    Types: Cigars, Cigarettes    Quit date: 08/09/2014    Years since quitting: 6.6   Smokeless tobacco: Never  Vaping Use   Vaping Use: Never used  Substance and Sexual Activity   Alcohol use: No    Alcohol/week: 0.0 standard drinks   Drug use: No   Sexual activity: Not Currently    Partners: Male    Comment: pt. declined condoms  Other Topics Concern   Not on file  Social History Narrative   Epworth Sleepiness Scale = 7 (as of 03/16/2015)   Social Determinants of Health   Financial Resource Strain: Medium Risk   Difficulty of Paying Living Expenses: Somewhat hard  Food Insecurity: Not on file  Transportation Needs: Not on file  Physical Activity: Not on file  Stress: Not on file  Social Connections: Not on file  Intimate Partner Violence: Not on file    Current Outpatient Medications on File Prior to Visit  Medication Sig Dispense Refill   acetaminophen (TYLENOL) 500 MG tablet Take 1,500 mg by mouth in the morning and at bedtime.     ALPRAZolam (XANAX) 1 MG tablet Take 1 mg by mouth at bedtime. *May take  one tablet up to 4 times daily as needed for anxiety     amLODipine (NORVASC) 5 MG tablet Take 1 tablet (5 mg total) by mouth daily. 180 tablet 3   amphetamine-dextroamphetamine (ADDERALL) 30 MG tablet Take 30 mg by mouth 3 (three) times daily.     B-D ULTRAFINE III SHORT PEN 31G X 8 MM MISC USE AS DIRECTED THREE TIMES DAILY 100 each 2   brexpiprazole (REXULTI) 1 MG TABS tablet Take 1 mg by mouth at bedtime.     canagliflozin (INVOKANA) 300 MG TABS tablet Take 1 tablet (300 mg total) by mouth daily before breakfast. 90 tablet 3   Continuous Blood Gluc Receiver (FREESTYLE LIBRE 2 READER) DEVI Use as instructed to check blood sugars. 1 each 2   diclofenac Sodium (VOLTAREN) 1 % GEL APPLY 2 GM TO EACH KNEE EVERY MORNING AND EVERY NIGHT AT BEDTIME AND 1 GM TO EACH KNEE INI THE AFTERNOON 300 g 0   divalproex  (DEPAKOTE ER) 500 MG 24 hr tablet TAKE 1 TABLET(500 MG) BY MOUTH AT BEDTIME 90 tablet 0   Dulaglutide (TRULICITY) 4.5 VH/8.4ON SOPN Inject 4.5 mg as directed once a week. 6 mL 3   ezetimibe (ZETIA) 10 MG tablet TAKE 1 TABLET(10 MG) BY MOUTH DAILY 90 tablet 3   furosemide (LASIX) 40 MG tablet Take 1 tablet (40 mg total) by mouth daily. 90 tablet 3   Insulin Aspart FlexPen (NOVOLOG) 100 UNIT/ML INJECT 15 UNITS UNDER THE SKIN WITH BREAKFAST AND 25 UNITS WITH SUPPER 36 mL 0   insulin glargine (LANTUS SOLOSTAR) 100 UNIT/ML Solostar Pen Inject 100 Units into the skin every morning. 105 mL 3   levothyroxine (SYNTHROID, LEVOTHROID) 50 MCG tablet Take 1 tablet (50 mcg total) by mouth at bedtime. 30 tablet 11   ondansetron (ZOFRAN) 8 MG tablet TAKE 1 TABLET(8 MG) BY MOUTH EVERY 8 HOURS AS NEEDED FOR NAUSEA OR VOMITING 20 tablet 1   pantoprazole (PROTONIX) 40 MG tablet Take 1 tablet (40 mg total) by mouth daily. 90 tablet 1   pregabalin (LYRICA) 150 MG capsule Take 2 capsules (300 mg total) by mouth 2 (two) times daily. 360 capsule 0   protriptyline (VIVACTIL) 10 MG tablet Take 10 mg by mouth 3 (three) times daily.   11   silodosin (RAPAFLO) 8 MG CAPS capsule Take 8 mg by mouth daily.     tamsulosin (FLOMAX) 0.4 MG CAPS capsule Take 1-2 capsules (0.4-0.8 mg total) by mouth daily. 30 capsule 0   TRINTELLIX 20 MG TABS tablet Take 20 mg by mouth at bedtime.      TRIUMEQ 600-50-300 MG tablet TAKE 1 TABLET BY MOUTH DAILY 30 tablet 5   XARELTO 20 MG TABS tablet TAKE 1 TABLET(20 MG) BY MOUTH DAILY WITH SUPPER 90 tablet 0   zolpidem (AMBIEN) 10 MG tablet Take 10 mg by mouth at bedtime.     No current facility-administered medications on file prior to visit.    Allergies  Allergen Reactions   Aspirin Swelling   Ibuprofen Swelling   Sustiva [Efavirenz] Swelling and Rash   Nsaids Other (See Comments)    unknwn   Lipitor [Atorvastatin Calcium] Other (See Comments)    Leg pain    Family History  Problem  Relation Age of Onset   Depression Brother    Throat cancer Brother        half brother, never smoker   COPD Mother    Diabetes Neg Hx     BP 100/60  Pulse 84    Ht 5' 10"  (1.778 m)    Wt 260 lb 12.8 oz (118.3 kg)    SpO2 96%    BMI 37.42 kg/m    Review of Systems He is unaware of any hypoglycemia.      Objective:   Physical Exam   Lab Results  Component Value Date   TSH 0.47 05/07/2020   Lab Results  Component Value Date   CREATININE 1.68 (H) 03/26/2021   BUN 28 (H) 03/26/2021   NA 137 03/26/2021   K 4.0 03/26/2021   CL 98 03/26/2021   CO2 25 03/26/2021    Lab Results  Component Value Date   HGBA1C 7.0 (A) 04/11/2021      Assessment & Plan:  Insulin-requiring type 2 [id number] Device malfunction  Patient Instructions  Please continue the same insulins and other diabetes medications.   check your blood sugar twice a day.  vary the time of day when you check, between before the 3 meals, and at bedtime.  also check if you have symptoms of your blood sugar being too high or too low.  please keep a record of the readings and bring it to your next appointment here (or you can bring the meter itself).  You can write it on any piece of paper.  please call us sooner if your blood sugar goes below 70, or if you have a lot of readings over 200.   Please see a diabetes educator, about the continuous glucose monitor.  Please bring a sensor and reader with you.   Please come back for a follow-up appointment in 2 months.

## 2021-04-11 NOTE — Patient Instructions (Addendum)
Please continue the same insulins and other diabetes medications.   check your blood sugar twice a day.  vary the time of day when you check, between before the 3 meals, and at bedtime.  also check if you have symptoms of your blood sugar being too high or too low.  please keep a record of the readings and bring it to your next appointment here (or you can bring the meter itself).  You can write it on any piece of paper.  please call us sooner if your blood sugar goes below 70, or if you have a lot of readings over 200.   Please see a diabetes educator, about the continuous glucose monitor.  Please bring a sensor and reader with you.   Please come back for a follow-up appointment in 2 months.

## 2021-04-12 ENCOUNTER — Encounter: Payer: Self-pay | Admitting: Family Medicine

## 2021-04-12 DIAGNOSIS — R350 Frequency of micturition: Secondary | ICD-10-CM | POA: Diagnosis not present

## 2021-04-12 DIAGNOSIS — R3914 Feeling of incomplete bladder emptying: Secondary | ICD-10-CM | POA: Diagnosis not present

## 2021-04-12 DIAGNOSIS — N401 Enlarged prostate with lower urinary tract symptoms: Secondary | ICD-10-CM | POA: Diagnosis not present

## 2021-04-15 ENCOUNTER — Telehealth: Payer: Self-pay | Admitting: Pharmacy Technician

## 2021-04-15 ENCOUNTER — Other Ambulatory Visit (HOSPITAL_COMMUNITY): Payer: Self-pay

## 2021-04-15 NOTE — Telephone Encounter (Signed)
Patient Advocate Encounter  Received notification from MEDICARE PART D (VIA FAX) that prior authorization for INSULI ASPART (U-100) is required.   PA NOT NEEDED  MUST FILL AS NOVOLOG Test Billing results returned a copay of $35 Status is pending   [facility] will continue to follow  Luciano Cutter, CPhT [facility] Phone: [phone] Fax:  [phone]

## 2021-04-15 NOTE — Progress Notes (Unsigned)
Patient ID: Daniel [name]                 DOB: 03/05/1955                    MRN: [id number]     HPI: Daniel [name] is a 66 y.o. male patient referred to lipid clinic by Daniel [name]. PMH is significant for T2DM (A1c 7.0), HTN, CKD, HIV, HLD, recurrent DVT, and neuropathy with amputation.  Current Medications: Zetia 70m  Intolerances:  Statins: myalgias and elevated LFTs Risk Factors:  T2DM HLD  LDL goal: <55  Diet:   Exercise:   Family History:   Social History:   Labs: TC 215, HDL 39, Trigs 222, LDL 136 (03/26/21 on Zetia)  Past Medical History:  Diagnosis Date   ADHD (attention deficit hyperactivity disorder)    Anxiety    Chronic kidney disease    [place]otting disorder ([place]    Depression    Diabetes mellitus without complication ([facility]    Diabetes mellitus, type II ([facility]    Dizziness 03/17/2015   Essential hypertension 06/25/2018   GERD (gastroesophageal reflux disease)    HIV disease ([name])    HIV infection ([facility]    Liver disease    OSA (obstructive sleep apnea) 07/25/2015   Uses CPAP regularly   Peripheral vascular disease ([facility]    Ulcer     Current Outpatient Medications on File Prior to Visit  Medication Sig Dispense Refill   acetaminophen (TYLENOL) 500 MG tablet Take 1,500 mg by mouth in the morning and at bedtime.     ALPRAZolam (XANAX) 1 MG tablet Take 1 mg by mouth at bedtime. *May take one tablet up to 4 times daily as needed for anxiety     amLODipine (NORVASC) 5 MG tablet Take 1 tablet (5 mg total) by mouth daily. 180 tablet 3   amphetamine-dextroamphetamine (ADDERALL) 30 MG tablet Take 30 mg by mouth 3 (three) times daily.     B-D ULTRAFINE III SHORT PEN 31G X 8 MM MISC USE AS DIRECTED THREE TIMES DAILY 100 each 2   brexpiprazole (REXULTI) 1 MG TABS tablet Take 1 mg by mouth at bedtime.     canagliflozin (INVOKANA) 300 MG TABS tablet Take 1 tablet (300 mg total) by mouth daily before breakfast. 90 tablet 3   Continuous Blood Gluc Receiver (FREESTYLE  LIBRE 2 READER) DEVI Use as instructed to check blood sugars. 1 each 2   Continuous Blood Gluc Sensor (FREESTYLE LIBRE 14 DAY SENSOR) MISC USE AS DIRECTED EVERY 14 DAYS 6 each 3   diclofenac Sodium (VOLTAREN) 1 % GEL APPLY 2 GM TO EACH KNEE EVERY MORNING AND EVERY NIGHT AT BEDTIME AND 1 GM TO EACH KNEE INI THE AFTERNOON 300 g 0   divalproex (DEPAKOTE ER) 500 MG 24 hr tablet TAKE 1 TABLET(500 MG) BY MOUTH AT BEDTIME 90 tablet 0   Dulaglutide (TRULICITY) 4.5 MLT/9.0ZESOPN Inject 4.5 mg as directed once a week. 6 mL 3   ezetimibe (ZETIA) 10 MG tablet TAKE 1 TABLET(10 MG) BY MOUTH DAILY 90 tablet 3   furosemide (LASIX) 40 MG tablet Take 1 tablet (40 mg total) by mouth daily. 90 tablet 3   Insulin Aspart FlexPen (NOVOLOG) 100 UNIT/ML INJECT 15 UNITS UNDER THE SKIN WITH BREAKFAST AND 25 UNITS WITH SUPPER 36 mL 0   insulin glargine (LANTUS SOLOSTAR) 100 UNIT/ML Solostar Pen Inject 100 Units into the skin every morning. 105 mL 3   levothyroxine (SYNTHROID,  LEVOTHROID) 50 MCG tablet Take 1 tablet (50 mcg total) by mouth at bedtime. 30 tablet 11   ondansetron (ZOFRAN) 8 MG tablet TAKE 1 TABLET(8 MG) BY MOUTH EVERY 8 HOURS AS NEEDED FOR NAUSEA OR VOMITING 20 tablet 1   pantoprazole (PROTONIX) 40 MG tablet Take 1 tablet (40 mg total) by mouth daily. 90 tablet 1   pregabalin (LYRICA) 150 MG capsule Take 2 capsules (300 mg total) by mouth 2 (two) times daily. 360 capsule 0   protriptyline (VIVACTIL) 10 MG tablet Take 10 mg by mouth 3 (three) times daily.   11   silodosin (RAPAFLO) 8 MG CAPS capsule Take 8 mg by mouth daily.     tamsulosin (FLOMAX) 0.4 MG CAPS capsule Take 1-2 capsules (0.4-0.8 mg total) by mouth daily. 30 capsule 0   TRINTELLIX 20 MG TABS tablet Take 20 mg by mouth at bedtime.      TRIUMEQ 600-50-300 MG tablet TAKE 1 TABLET BY MOUTH DAILY 30 tablet 5   XARELTO 20 MG TABS tablet TAKE 1 TABLET(20 MG) BY MOUTH DAILY WITH SUPPER 90 tablet 0   zolpidem (AMBIEN) 10 MG tablet Take 10 mg by mouth at  bedtime.     No current facility-administered medications on file prior to visit.    Allergies  Allergen Reactions   Aspirin Swelling   Ibuprofen Swelling   Sustiva [Efavirenz] Swelling and Rash   Nsaids Other (See Comments)    unknwn   Lipitor [Atorvastatin Calcium] Other (See Comments)    Leg pain    Assessment/Plan:  1. Hyperlipidemia -

## 2021-04-16 ENCOUNTER — Ambulatory Visit: Payer: PPO

## 2021-04-16 ENCOUNTER — Telehealth: Payer: Self-pay

## 2021-04-16 NOTE — Telephone Encounter (Signed)
Lvm for missed appt

## 2021-04-17 ENCOUNTER — Telehealth: Payer: Self-pay | Admitting: Endocrinology

## 2021-04-17 DIAGNOSIS — E1122 Type 2 diabetes mellitus with diabetic chronic kidney disease: Secondary | ICD-10-CM

## 2021-04-17 DIAGNOSIS — N1831 Chronic kidney disease, stage 3a: Secondary | ICD-10-CM

## 2021-04-17 NOTE — Telephone Encounter (Signed)
Patient dropped on Medicare paperwork that denied his Invokana and is requesting that it be reviewed.  Patient advised that there is an attached page that can be completed and returned to Medicare.  He has left this paper work and it has been placed in Dr [facility] [facility] box at the front desk.  Call back # [phone]

## 2021-04-17 NOTE — Telephone Encounter (Signed)
Patient came into the office to advise that the wrong type for Free Style sensors were sent to the pharmacy for him.    He needs the Free [employer] 2 Sensors to be sent to the [facility] at [address] in  [place]   Any questions please contact [phone]

## 2021-04-18 ENCOUNTER — Other Ambulatory Visit: Payer: Self-pay | Admitting: Endocrinology

## 2021-04-18 ENCOUNTER — Other Ambulatory Visit: Payer: Self-pay

## 2021-04-18 DIAGNOSIS — Z794 Long term (current) use of insulin: Secondary | ICD-10-CM

## 2021-04-18 DIAGNOSIS — E1122 Type 2 diabetes mellitus with diabetic chronic kidney disease: Secondary | ICD-10-CM

## 2021-04-18 MED ORDER — FREESTYLE LIBRE 2 READER DEVI: 2 refills | Status: DC

## 2021-04-18 MED ORDER — DAPAGLIFLOZIN PROPANEDIOL 10 MG PO TABS: 10.0000 mg | ORAL_TABLET | Freq: Every day | ORAL | 3 refills | Status: DC

## 2021-04-18 MED ORDER — FREESTYLE LIBRE 2 SENSOR MISC: 3 refills | Status: DC

## 2021-04-18 MED ORDER — NOVOLOG FLEXPEN 100 UNIT/ML ~~LOC~~ SOPN: PEN_INJECTOR | SUBCUTANEOUS | 3 refills | Status: DC

## 2021-04-18 NOTE — Telephone Encounter (Signed)
RX for freestyle libre 2 sensors has now been sent to preferred pharmacy

## 2021-04-18 NOTE — Telephone Encounter (Signed)
Medicare paperwork received and placed on providers desk for review.

## 2021-04-19 ENCOUNTER — Other Ambulatory Visit: Payer: Self-pay

## 2021-04-19 DIAGNOSIS — N1831 Chronic kidney disease, stage 3a: Secondary | ICD-10-CM

## 2021-04-19 DIAGNOSIS — Z794 Long term (current) use of insulin: Secondary | ICD-10-CM

## 2021-04-19 MED ORDER — FREESTYLE LIBRE 2 SENSOR MISC: 3 refills | Status: DC

## 2021-04-22 ENCOUNTER — Other Ambulatory Visit: Payer: Self-pay | Admitting: Family Medicine

## 2021-04-22 ENCOUNTER — Other Ambulatory Visit: Payer: Self-pay | Admitting: Internal Medicine

## 2021-04-22 DIAGNOSIS — G8929 Other chronic pain: Secondary | ICD-10-CM

## 2021-04-22 DIAGNOSIS — B20 Human immunodeficiency virus [HIV] disease: Secondary | ICD-10-CM

## 2021-04-23 NOTE — Telephone Encounter (Signed)
Looks like patient has been on Depakote for years and stays undetectable on Triumeq. Drug interaction database states to monitor therapy. Ok to continue both at this time.

## 2021-04-23 NOTE — Telephone Encounter (Signed)
DDI with Triumeq and Depakote. Routing to pharmacy team.

## 2021-04-24 ENCOUNTER — Ambulatory Visit (INDEPENDENT_AMBULATORY_CARE_PROVIDER_SITE_OTHER): Payer: PPO | Admitting: Pharmacist

## 2021-04-24 DIAGNOSIS — E1122 Type 2 diabetes mellitus with diabetic chronic kidney disease: Secondary | ICD-10-CM

## 2021-04-24 DIAGNOSIS — Z794 Long term (current) use of insulin: Secondary | ICD-10-CM

## 2021-04-24 DIAGNOSIS — E785 Hyperlipidemia, unspecified: Secondary | ICD-10-CM

## 2021-04-24 NOTE — Progress Notes (Signed)
Chronic Care Management Pharmacy Note  04/25/2021 Name:  ARBIE [name] MRN:  [id number] DOB:  08-17-1955  Summary: Pharmd follow up.  Convinced to reschedule with lipid clinic.  Would consider Repatha or Praluent due to elevated LDL  and statin intolerance.  Continue to follow glucose - adjust long acting insulin if needed and increase meal time to avoid over basalization.  Will have CMA check in next month.   Subjective: Daniel [name] is an 66 y.o. year old male who is a primary patient of Daniel [name], Daniel Patrick, MD.  The CCM team was consulted for assistance with disease management and care coordination needs.    Engaged with patient by telephone for follow up visit in response to provider referral for pharmacy case management and/or care coordination services.   Consent to Services:  The patient was given the following information about Chronic Care Management services today, agreed to services, and gave verbal consent: 1. CCM service includes personalized support from designated clinical staff supervised by the primary care provider, including individualized plan of care and coordination with other care providers 2. 24/7 contact phone numbers for assistance for urgent and routine care needs. 3. Service will only be billed when office clinical staff spend 20 minutes or more in a month to coordinate care. 4. Only one practitioner [name] furnish and bill the service in a calendar month. 5.The patient [name] stop CCM services at any time (effective at the end of the month) by phone call to the office staff. 6. The patient will be responsible for cost sharing (co-pay) of up to 20% of the service fee (after annual deductible is met). Patient agreed to services and consent obtained.  Patient Care Team: Daniel Agreste, MD as PCP - General (Family Medicine) Comer, Daniel Regal, MD as PCP - Infectious Diseases (Infectious Diseases) Daniel Latch, MD as PCP - Cardiology (Cardiology) Daniel Spates, MD  (Inactive) as PCP - Gastroenterology (Gastroenterology) Daniel [name], DPM as Consulting Physician (Podiatry) Daniel Shin, MD as Consulting Physician (Endocrinology) Daniel [name], [place] (Optometry) Daniel Harp, MD as Consulting Physician (Cardiology) Daniel May, MD as Consulting Physician (Psychiatry) Daniel Parish, MD as Consulting Physician (Nephrology) Renette Butters, MD as Attending Physician (Orthopedic Surgery) Daniel Pacas, MD as Consulting Physician (Neurology) Dohmeier, Asencion Partridge, MD as Consulting Physician (Neurology) Daniel Guys, MD as Consulting Physician (Ophthalmology) Daniel [name], [facility] (Pharmacist)  Recent office visits:  02/04/2021 Daniel Parish, MD (PCP) - Family Medicine - Urinary hesitancy -  Decrease flomax to 1 pill as it appears 2 pills are causing the sleepiness.  Due to concern on effect of your PSA and evaluation with urology next week. Follow up as needed.    12/27/2020 Daniel Ray, MD (PCP) - Family Medicine - Urinary hesitancy - Referral placed to Urology and Dermatology. Tamsulosin (FLOMAX) 0.4 MG CAPS capsule prescribed. Follow up in 1 month.    12/05/2020 Daniel Ray, MD (PCP) - Family Medicine - Diabetes - labs were ordered. Referral placed for Physical therapy. Pantoprazole (PROTONIX) 40 MG tablet prescribed. Follow up in 2 weeks.    09/17/2020 Daniel Ray, MD (PCP) - Family Medicine - Personal history of fall - Labs were ordered. Xray of Sacrum/Coccyx, Pelvis, and Chest ordered, Referral to Nephrology. Follow up if no improvement.    Recent consult visits:  02/07/2021 [name], PA-C - Cardiology - Dyspnea on exertion - Labs were ordered. EKG performed. amLODipine (NORVASC) 5 MG tablet and Furosemide (LASIX) 40 MG tablet prescribed. Follow up in 6 months.  01/09/21 Daniel Shin, MD - Diabetes - Internal Medicine - [facility] ordered. No medication changes.Check your blood sugar twice a day.  vary the time of day when you  check, between before the 3 meals, and at bedtime. Follow up in 3-4 months.    10/08/2020 Daniel Shin, MD - Class 2 Obesity - Internal Medicine - [facility] ordered. canagliflozin (INVOKANA) 300 MG TABS tablet and Dulaglutide (TRULICITY) 4.5 DX/8.3JA SOPN prescribed. Reduce the Lantus to 100 units each morning increase the Trulicity and continue the same Invokana. Follow up in 2-3 months.    08/29/20 Daniel Givens, NP - Neurology - OSA on CPAP -  Home sleep study ordered. Continue CPAP- pressure increased to 8-16 Home sleep test ordered. Will order new machine after test. Follow up in 1 year.      Hospital visits:  None in previous 6 months   Objective:  Lab Results  Component Value Date   CREATININE 1.68 (H) 03/26/2021   BUN 28 (H) 03/26/2021   GFR 46.66 (L) 05/07/2020   GFRNONAA 46 (L) 01/04/2020   GFRAA 53 (L) 01/04/2020   NA 137 03/26/2021   K 4.0 03/26/2021   CALCIUM 9.7 03/26/2021   CO2 25 03/26/2021   GLUCOSE 135 (H) 03/26/2021    Lab Results  Component Value Date/Time   HGBA1C 7.0 (A) 04/11/2021 02:08 PM   HGBA1C 7.2 (A) 01/09/2021 04:12 PM   HGBA1C 8.7 (H) 06/28/2016 12:00 AM   HGBA1C 7.2 (H) 06/28/2015 08:48 AM   FRUCTOSAMINE 252 05/07/2020 04:37 PM   GFR 46.66 (L) 05/07/2020 04:37 PM   MICROALBUR 19.5 (H) 12/05/2020 03:58 PM   MICROALBUR 107.5 (H) 08/01/2014 01:29 PM    Last diabetic Eye exam:  Lab Results  Component Value Date/Time   HMDIABEYEEXA No Retinopathy 02/06/2020 12:00 AM    Last diabetic Foot exam: No results found for: HMDIABFOOTEX   Lab Results  Component Value Date   CHOL 215 (H) 03/26/2021   HDL 39 (L) 03/26/2021   LDLCALC 136 (H) 03/26/2021   TRIG 222 (H) 03/26/2021   CHOLHDL 5.5 (H) 03/26/2021    Hepatic Function Latest Ref Rng & Units 03/26/2021 09/15/2019 09/14/2019  Total Protein 6.0 - 8.5 g/dL 7.0 6.7 7.2  Albumin 3.8 - 4.8 g/dL 4.6 3.5 3.7  AST 0 - 40 IU/L _0 ALT 0 - 44 IU/L 43 24 25  Alk Phosphatase 44 - 121 IU/L 121 78 85   Total Bilirubin 0.0 - 1.2 mg/dL 0.6 0.7 0.7    Lab Results  Component Value Date/Time   TSH 0.47 05/07/2020 04:37 PM   TSH 0.108 (L) 09/14/2019 03:54 PM   TSH 0.736 09/17/2016 09:57 AM   FREET4 0.78 05/07/2020 04:37 PM   FREET4 1.04 09/15/2019 04:15 AM    CBC Latest Ref Rng & Units 09/17/2020 09/15/2019 09/14/2019  WBC 4.0 - 10.5 K/uL 7.2 6.0 6.0  Hemoglobin 13.0 - 17.0 g/dL 17.2(H) 15.9 16.0  Hematocrit 39.0 - 52.0 % 48.2 46.8 46.1  Platelets 150.0 - 400.0 K/uL 146.0(L) 125(L) 130(L)    Lab Results  Component Value Date/Time   VD25OH 23 (L) 02/12/2018 02:40 PM    Clinical ASCVD: Yes  The 10-year ASCVD risk score (Arnett DK, et al., 2019) is: 24.3%   Values used to calculate the score:     Age: 40 years     Sex: Male     Is Non-Hispanic African American: No     Diabetic: Yes     Tobacco smoker:  No     Systolic Blood Pressure: 803 mmHg     Is BP treated: Yes     HDL Cholesterol: 39 mg/dL     Total Cholesterol: 215 mg/dL    Depression screen [facility] 2/9 04/09/2021 02/04/2021 12/27/2020  Decreased Interest _0 Down, Depressed, Hopeless _1 PHQ - 2 Score _2 Altered sleeping _3 Tired, decreased energy _4 Change in appetite 0 1 2  Feeling bad or failure about yourself  _5 Trouble concentrating _6 Moving slowly or fidgety/restless 0 0 0  Suicidal thoughts 0 0 0  PHQ-9 Score _7 Some recent data might be hidden     Social History   Tobacco Use  Smoking Status Former   Packs/day: 0.10   Years: 10.00   Pack years: 1.00   Types: Cigars, Cigarettes   Quit date: 08/09/2014   Years since quitting: 6.7  Smokeless Tobacco Never   BP Readings from Last 3 Encounters:  04/11/21 100/60  04/10/21 136/78  04/09/21 111/75   Pulse Readings from Last 3 Encounters:  04/11/21 84  04/10/21 78  04/09/21 95   Wt Readings from Last 3 Encounters:  04/11/21 260 lb 12.8 oz (118.3 kg)  04/10/21 258 lb 6.4 oz (117.2 kg)  04/09/21 255 lb (115.7 kg)    BMI Readings from Last 3 Encounters:  04/11/21 37.42 kg/m  04/10/21 37.08 kg/m  04/09/21 36.59 kg/m    Assessment/Interventions: Review of patient past medical history, allergies, medications, health status, including review of consultants reports, laboratory and other test data, was performed as part of comprehensive evaluation and provision of chronic care management services.   SDOH:  (Social Determinants of Health) assessments and interventions performed: Yes  Financial Resource Strain: Medium Risk   Difficulty of Paying Living Expenses: Somewhat hard    SDOH Screenings   Alcohol Screen: Not on file  Depression (PHQ2-9): Medium Risk   PHQ-2 Score: 8  Financial Resource Strain: Medium Risk   Difficulty of Paying Living Expenses: Somewhat hard  Food Insecurity: Not on file  Housing: Not on file  Physical Activity: Not on file  Social Connections: Not on file  Stress: Not on file  Tobacco Use: Medium Risk   Smoking Tobacco Use: Former   Smokeless Tobacco Use: Never   Passive Exposure: Not on file  Transportation Needs: Not on file    [place]  Allergies  Allergen Reactions   Aspirin Swelling   Ibuprofen Swelling   Sustiva [Efavirenz] Swelling and Rash   Nsaids Other (See Comments)    unknwn   Lipitor [Atorvastatin Calcium] Other (See Comments)    Leg pain    Medications Reviewed Today     Reviewed by Daniel [name], [facility] (Pharmacist) on 04/25/21 at 1244  Med List Status: <None>   Medication Order Taking? Sig Documenting Provider Last Dose Status Informant  acetaminophen (TYLENOL) 500 MG tablet [id number] Yes Take 1,500 mg by mouth in the morning and at bedtime. [provider] Taking Active Self           Med Note Trinidad Curet, DANA K   Wed Sep 14, 2019  8:16 PM)    ALPRAZolam Duanne Moron) 1 MG tablet [id number] Yes Take 1 mg by mouth at bedtime. *[name] take one tablet up to 4 times daily as needed for anxiety [provider] Taking Active Self   amLODipine (NORVASC) 5 MG  tablet [id number] Yes Take 1 tablet (5 mg total) by mouth daily. Darreld Mclean, PA-C Taking Active   amphetamine-dextroamphetamine (ADDERALL) 30 MG tablet [id number] Yes Take 30 mg by mouth 3 (three) times daily. [provider] Taking Active Self           Med Note Rosana Hoes, Debborah Alonge L   Wed Feb 20, 2021  3:16 PM) Patient 84m per day  B-D ULTRAFINE III SHORT PEN 31G X 8 MM MISC [id number] USE AS DIRECTED THREE TIMES DAILY ERenato Shin MD Taking Active   brexpiprazole (REXULTI) 1 MG TABS tablet [id number] Take 1 mg by mouth at bedtime. [provider] Taking Active Self  Continuous Blood Gluc Receiver (FREESTYLE LIBRE 2 READER) DEVI [id number] Use as instructed to check blood sugars. ERenato Shin MD Taking Active   Continuous Blood Gluc Sensor (FREESTYLE LIBRE 1[id number] USE AS DIRECTED EVERY 14 DAYS ERenato Shin MD  Active   Continuous Blood Gluc Sensor (FREESTYLE LIBRE 2 SENSOR) [id number] Use as instructed to check blood sugar, change sensor every 10 days ERenato Shin MD  Active   dapagliflozin propanediol (FARXIGA) 10 MG TABS tablet [id number] Take 1 tablet (10 mg total) by mouth daily before breakfast. ERenato Shin MD Taking Active   diclofenac Sodium (VOLTAREN) 1 % GEL [id number] APPLY 2 GM TO EACH KNEE EVERY MORNING AND EVERY NIGHT AT BEDTIME AND 1 GM TO EACH KNEE IN THE AFTERNOON GWendie Agreste MD Taking Active   divalproex (DEPAKOTE ER) 500 MG 24 hr tablet [id number] TAKE 1 TABLET(500 MG) BY MOUTH AT BEDTIME GWendie Agreste MD Taking Active   Dulaglutide (TRULICITY) 4.5 MOH/6.0VPSOPN [id number] Inject 4.5 mg as directed once a week. ERenato Shin MD Taking Active   ezetimibe (ZETIA) 10 MG tablet [id number] TAKE 1 TABLET(10 MG) BY MOUTH DAILY RSkeet Latch MD Taking Active   furosemide (LASIX) 40 MG tablet [id number] Take 1 tablet (40 mg total) by mouth daily. GDarreld Mclean PA-C Taking Active   insulin aspart (NOVOLOG FLEXPEN) 100 UNIT/ML FlexPen [id number] 15 units with breakfast and 25 units with supper.  And pen needles 3/day ERenato Shin MD Taking Active   insulin glargine (LANTUS SOLOSTAR) 100 UNIT/ML Solostar Pen [id number] Inject 100 Units into the skin every morning. ERenato Shin MD Taking Active   levothyroxine (SYNTHROID, LEVOTHROID) 50 MCG tablet [id number] Take 1 tablet (50 mcg total) by mouth at bedtime. DDarlyne Russian MD Taking Active Self  ondansetron (ZOFRAN) 8 MG tablet [id number] TAKE 1 TABLET(8 MG) BY MOUTH EVERY 8 HOURS AS NEEDED FOR NAUSEA OR VOMITING GWendie Agreste MD Taking Active   pantoprazole (PROTONIX) 40 MG tablet [id number] Take 1 tablet (40 mg total) by mouth daily. GWendie Agreste MD Taking Active   pregabalin (LYRICA) 150 MG capsule [id number] Take 2 capsules (300 mg total) by mouth 2 (two) times daily. TMidge Minium MD Taking Active   protriptyline (VIVACTIL) 10 MG tablet [id number] Take 10 mg by mouth 3 (three) times daily.  [provider] Taking Active Self           Med Note (Luana Shu NATASHA   Tue Jan 20, 2017  3:28 PM)    silodosin (RAPAFLO) 8 MG CAPS capsule [id number] Take 8 mg by mouth daily. [provider] Taking Active   tamsulosin (FLOMAX) 0.4 MG CAPS capsule [id number] Take 1-2 capsules (  0.4-0.8 mg total) by mouth daily. Daniel Agreste, MD  Active   TRINTELLIX 20 MG TABS tablet [id number] Yes Take 20 mg by mouth at bedtime.  [provider] Taking Active Self           Med Note Geroge Baseman   Fri Feb 09, 2015  3:31 PM)    Perlie Gold 600-50-300 MG tablet [id number] Yes TAKE 1 TABLET BY MOUTH DAILY Comer, Daniel Regal, MD Taking Active   XARELTO 20 MG TABS tablet [id number] Yes TAKE 1 TABLET(20 MG) BY MOUTH DAILY WITH SUPPER Daniel Agreste, MD Taking Active   zolpidem (AMBIEN) 10 MG tablet [id number] Yes Take 10 mg by mouth at bedtime. [provider] Taking Active Self            Patient Active Problem List   Diagnosis Date Noted   Atherosclerosis of native artery of extremity ([place]) 04/25/2021   Chronic nonalcoholic liver disease 84/16/6063   History of hepatitis B 04/25/2021   HIV positive ([place]) 04/25/2021   Long term (current) use of insulin ([place]) 04/25/2021   Long term (current) use of anticoagulants 01/60/1093   Nonalcoholic steatohepatitis ([place]) 04/25/2021   Type 2 diabetes mellitus with other circulatory complications ([place]) 23/55/7322   Type 2 diabetes mellitus with peripheral angiopathy ([place]) 04/25/2021   Abnormal liver function tests 04/25/2021   Statin myopathy 02/12/2021   Unilateral primary osteoarthritis, right knee 12/15/2019   Generalized weakness 09/14/2019   Severe major depression ([place]) 11/17/2018   Class 2 obesity due to excess calories without serious comorbidity with body mass index (BMI) of 36.0 to 36.9 in adult 11/17/2018   Anticoagulated 08/24/2018   Dyslipidemia 07/27/2018   Essential hypertension 06/25/2018   Hypogonadism in male 05/10/2018   Numbness 03/31/2018   Diabetic peripheral neuropathy ([place]) 01/25/2018   S/P transmetatarsal amputation of foot, right ([place]) 10/09/2017   Hypothyroidism    Constipation    Obesity, Class III, BMI 40-49.9 (morbid obesity) ([place]) 01/15/2017   DOE (dyspnea on exertion) 01/13/2017   Imbalance 11/18/2016   Osteomyelitis of right foot ([place]) 09/22/2016   Chronic migraine 09/17/2016   Gait abnormality 09/17/2016   Fall 12/05/2015   Toe ulcer, right ([place]) 09/19/2015   Decreased pedal pulses 09/19/2015   OSA on CPAP 09/05/2015   Major depressive disorder, recurrent episode, moderate ([place]) 09/05/2015   Recurrent falls while walking 06/24/2015   H/O migraine 06/22/2015   Sinus headache 06/21/2015   OSA (obstructive sleep apnea) 06/20/2015   History of DVT (deep vein thrombosis) 06/20/2015   History of falling 06/20/2015   Chronic, continuous use  of opioids 06/20/2015   Coagulopathy ([place]) 06/20/2015   Elevated CPK 06/20/2015   Pain syndrome, chronic 06/20/2015   Thrombocythemia 06/20/2015   Uncontrolled type 2 diabetes mellitus with hyperglycemia ([place]) 06/20/2015   Low back pain 06/11/2015   Unspecified abnormalities of gait and mobility 06/11/2015   Benign paroxysmal positional vertigo of right ear 05/01/2015   Chronic kidney disease, stage III (moderate) ([place]) 08/09/2014   Renal insufficiency 08/09/2014   Insulin-requiring or dependent type II diabetes mellitus ([place]) 11/07/2013   Diabetes mellitus ([place]) 11/07/2013   Steatosis of liver 09/09/2010   Human immunodeficiency virus (HIV) infection ([place]) 06/04/2006   Herpes zoster 06/04/2006   Depression 06/04/2006   THROMBOPHLEBITIS NOS 06/04/2006   Gastroesophageal reflux disease 06/04/2006   ARTHRITIS, HAND 06/04/2006   Attention deficit hyperactivity disorder 06/04/2006   Abnormal blood chemistry level 06/04/2006    Immunization History  Administered Date(s) Administered  Fluad Quad(high Dose 65+) 11/17/2018, 10/26/2019, 12/05/2020   H1N1 03/09/2008   Hepatitis A 09/09/2010, 05/29/2011   Influenza Split 12/06/2010, 12/04/2011   Influenza Whole 01/26/2004, 12/25/2005, 01/14/2007, 12/22/2007, 11/24/2009   Influenza,inj,Quad PF,6+ Mos 11/04/2012, 11/07/2013, 01/10/2015, 11/12/2015, 11/27/2016, 11/16/2017   Janssen (J&J) SARS-COV-2 Vaccination 07/04/2019   Moderna Sars-Covid-2 Vaccination 01/27/2020   Pneumococcal Conjugate-13 09/03/2006, 11/17/2018   Pneumococcal Polysaccharide-23 03/12/2000, 05/29/2011, 04/29/2012   Tdap 08/01/2014   Zoster Recombinat (Shingrix) 12/10/2016, 02/18/2017   Zoster, Live 11/08/2015    Conditions to be addressed/monitored:  HTN, GERD, Hypothyroidism, Neuropathy, MDD, HLD, Type II DM  Care Plan : General Pharmacy (Adult)  Updates made by Daniel [name], RPH since 04/25/2021 12:00 AM     Problem: HTN, GERD, Hypothyroidism, Neuropathy,  MDD, HLD, Type II DM   Priority: High  Onset Date: 02/20/2021     Long-Range Goal: Patient-Specific Goal   Start Date: 02/20/2021  Expected End Date: 08/21/2021  Recent Progress: On track  Priority: High  Note:   Current Barriers:  Unable to achieve control of glucose/BP  Suboptimal therapeutic regimen for cholesterol  Pharmacist Clinical Goal(s):  Patient will achieve improvement in A1c and LDL as evidenced by follow up labs through collaboration with PharmD and provider.   Interventions: 1:1 collaboration with Daniel Agreste, MD regarding development and update of comprehensive plan of care as evidenced by provider attestation and co-signature Inter-disciplinary care team collaboration (see longitudinal plan of care) Comprehensive medication review performed; medication list updated in electronic medical record  Hypertension (BP goal <130/80) -Uncontrolled -Current treatment: Amlodipine 33m daily -Medications previously tried: losartan  -Current home readings: 167/60-70 -Current dietary habits: high sodium intake, high carb and sugar intake - see DM for specifics -Denies hypotensive/hypertensive symptoms -Educated on BP goals and benefits of medications for prevention of heart attack, stroke and kidney damage; Exercise goal of 150 minutes per week; Importance of home blood pressure monitoring; -Counseled to monitor BP at home a few times per week, document, and provide log at future appointments -Recommended to continue current medication Previous history of losartan with unknown d/c. Recent elevated BP in office as well as at home.  Already having some fluid retention with swelling noted in feet - would be hesitant to increase amlodipine for this reason.  CMA to follow up on BP in 30 days and we will assess at that time before adding new agents.  Hyperlipidemia: (LDL goal < 70) -Uncontrolled -Current treatment: Zetia 164mdaily Appropriate, Query effective, ,   -Medications previously tried: atorvastatin (leg pain)  -Current dietary patterns: see DM -Current exercise habits: minimal -Educated on Cholesterol goals;  Benefits of statin for ASCVD risk reduction; Importance of limiting foods high in cholesterol; -Recommended to continue current medication Recommended patient come in for updated lipid panel (last was March 2021).  He does not remember any leg pain associated with Lipitor. If LDL remains elevated, would consider Crestor pending kidney labs or starting back Lipitor even at once or twice weekly to start and titrate as tolerated.  Update 04/25/21 LDL elevated ASCVD risk 24.3% - high risk.  He cannot tolerate statins in the past.  He missed his scheduled appointment with lipid clinic with cardiologist.  He needs to have some kind of more advanced treatment for lipids to reduce CV risk.  Rescheduled visit with lipid clinic.  Would recommend Repatha or Praluent if patient is agreeable. Will halep with patient assistance if needed. FU on visit next month to determine if further intervention is needed.  Continue to work on lifestyle mods.  Diabetes w/neuropathy (A1c goal <7%) -Uncontrolled -Current medications: Trulicity 4.5 VH/8.4ON Appropriate, Query effective, ,  Lantus 100 units every morning Appropriate, Effective, Novolog 15 units with breakfast and 25 units with supper Appropriate, Effective, Query Safe,  Farxiga 638m daily Appropriate, Effective, Safe, Accessible Lyrica 1559m2 capsules bid Appropriate, Effective, Safe, Accessible -Medications previously tried: metformin  -Current home glucose readings fasting glucose: 150s usually, 180 today post prandial glucose: 250 usually  -Reports hypoglycemic/hyperglycemic symptoms - mentions a few episodes of hypoglycemia per week where he will get dizzy and sweaty -Current meal patterns:  breakfast: N/A  lunch: frozen meals, ramen  dinner: frozen meals, ramen snacks: ice cream w/ pepsi  float drinks: Regular gatorade, milk -Current exercise: minimal -Educated on A1c and blood sugar goals; Benefits of weight loss; Prevention and management of hypoglycemic episodes; Benefits of routine self-monitoring of blood sugar; -Counseled to check feet daily and get yearly eye exams -Counseled on diet and exercise extensively Recommended to continue current medication Currently I believe he could make big strides with simple improvements in his diet.  This would allow [place] further reduce insulin and prevent hypoglycemia.  Counseled on importance of regular meals and eating with all doses of insulin. Today we set a goal to limit gatorades to one per day as well as work to limit our servings of ice cream floats.  Patient agreeable to plan Will f/u in 60 days to assess changes.  Update 04/24/21 165, 199, 212 are random glucose readings given to me today. A1c improved to 7.0 - he denies any hypoglycemia. He continues to work on diet cutting back on gatorades, ice cream floats.  He does report he drinks a substantial amount of milk - we discussed the carbohydrate and sugar content in this and he is going to try and cut back on milk.  Would caution on over basalization with Lantus insulin over 0.38m64mg.  We could increase the amount of meal time insulin based on meals and cut back on Lantus if he is experiencing episodes of hypoglycemia with controlled or elevated A1c. FU next month on glucose readings and diet changes with CMA.   Depression/Anxiety (Goal: Minimize symptoms) -Controlled -Current treatment: Trintellix 34m54mily Trintellix 38mg 56mly Rexulti 1mg d68my Alprazolam 1mg pr538mivactil 10mg ti50medications previously tried/failed: Pristiq -PHQ9:  PHQ9 SCORE ONLY 02/04/2021 12/27/2020 12/05/2020  PHQ-9 Total Score _0 -Sees Dr. RupinderChucky Mayer year for follow up. -Educated on Benefits of medication for symptom control -Recommended to continue current  medication Would hesitate to make changes as he is seen by psychology - of note, Trintellix maximum dose is 34mg per338m - will consult with Dr. Kaur to mToy Caresure she is aware of this dose.  GERD (Goal: Minimize symptoms) -Controlled -Current treatment  Pantoprazole 40mg dail74medications previously tried: none noted -Discussed appropriate timing of medication and difference between other options such as tums and Pepcid.  -Recommended to continue current medication Work on trigger foods and medication timing.  Hypothyroidism (Goal: Maintain TSH) -Controlled -Current treatment  Levothyroxine 50mcg dail338medications previously tried: none noted -Most recent TSH was WNL - he takes this medication in the evening usually with milk.  -Recommended to continue current medication Counseled on appropriate timing of this medication, would hesitate to make big changes with TSH normal.  However if it continues to fluctuate he [name] need to make changes on medication timing to ensure adequate and consistent absorption.  HIV  -Controlled -Current treatment  Triumeq 600-50-300 daily -Medications previously tried: Stribild, Jorje Guild, Atripla  -Recommended to continue current medication Assessed patient finances. He gets through [facility] in [place] with no copay. Continue as prescribed.  Patient Goals/Self-Care Activities Patient will:  - take medications as prescribed as evidenced by patient report and record review check blood pressure a few times per week, document, and provide at future appointments engage in dietary modifications by limiting gatorades to one per day and work on cutting back on servings of ice cream  Follow Up Plan: The care management team will reach out to the patient again over the next 30 days.            Medication Assistance: None required.  Patient affirms current coverage meets needs.  Compliance/Adherence/Medication fill history: Care  Gaps: Diabetic eye exam  Star-Rating Drugs: Dulaglutide (TRULICITY) 4.5 XF/8.1WE SOPN - last filled 12/21/2020 84 days   Patient's preferred pharmacy is:  [id number] - [place], [place] - [name] [place] [address] Phone: [phone] Fax: [phone]  [facility] [address], [place] [address] Phone: [phone] Fax: [phone]  Uses pill box? No - has own organization methods in a box Pt endorses 100% compliance  We discussed: Benefits of medication synchronization, packaging and delivery as well as enhanced pharmacist oversight with [employer]. Patient decided to: Continue current medication management strategy  Care Plan and Follow Up Patient Decision:  Patient agrees to Care Plan and Follow-up.  Plan: The care management team will reach out to the patient again over the next 30 days.  Beverly Milch, PharmD Clinical Pharmacist  [facility] [phone]

## 2021-04-25 ENCOUNTER — Telehealth: Payer: Self-pay | Admitting: Pharmacist

## 2021-04-25 ENCOUNTER — Other Ambulatory Visit: Payer: Self-pay

## 2021-04-25 ENCOUNTER — Encounter: Payer: Self-pay | Admitting: Pharmacist

## 2021-04-25 ENCOUNTER — Ambulatory Visit: Payer: PPO | Admitting: Pharmacist

## 2021-04-25 VITALS — BP 152/90 | HR 96 | Resp 14 | Ht 70.0 in | Wt 252.0 lb

## 2021-04-25 DIAGNOSIS — K769 Liver disease, unspecified: Secondary | ICD-10-CM | POA: Insufficient documentation

## 2021-04-25 DIAGNOSIS — E785 Hyperlipidemia, unspecified: Secondary | ICD-10-CM

## 2021-04-25 DIAGNOSIS — R7989 Other specified abnormal findings of blood chemistry: Secondary | ICD-10-CM | POA: Insufficient documentation

## 2021-04-25 DIAGNOSIS — G720 Drug-induced myopathy: Secondary | ICD-10-CM

## 2021-04-25 DIAGNOSIS — Z21 Asymptomatic human immunodeficiency virus [HIV] infection status: Secondary | ICD-10-CM | POA: Insufficient documentation

## 2021-04-25 DIAGNOSIS — E1151 Type 2 diabetes mellitus with diabetic peripheral angiopathy without gangrene: Secondary | ICD-10-CM | POA: Insufficient documentation

## 2021-04-25 DIAGNOSIS — I70209 Unspecified atherosclerosis of native arteries of extremities, unspecified extremity: Secondary | ICD-10-CM

## 2021-04-25 DIAGNOSIS — T466X5A Adverse effect of antihyperlipidemic and antiarteriosclerotic drugs, initial encounter: Secondary | ICD-10-CM

## 2021-04-25 DIAGNOSIS — Z8619 Personal history of other infectious and parasitic diseases: Secondary | ICD-10-CM | POA: Insufficient documentation

## 2021-04-25 DIAGNOSIS — K7581 Nonalcoholic steatohepatitis (NASH): Secondary | ICD-10-CM | POA: Insufficient documentation

## 2021-04-25 DIAGNOSIS — Z794 Long term (current) use of insulin: Secondary | ICD-10-CM | POA: Insufficient documentation

## 2021-04-25 DIAGNOSIS — E1159 Type 2 diabetes mellitus with other circulatory complications: Secondary | ICD-10-CM | POA: Insufficient documentation

## 2021-04-25 DIAGNOSIS — Z7901 Long term (current) use of anticoagulants: Secondary | ICD-10-CM | POA: Insufficient documentation

## 2021-04-25 MED ORDER — REPATHA SURECLICK 140 MG/ML ~~LOC~~ SOAJ: 140.0000 mg | SUBCUTANEOUS | 11 refills | Status: DC

## 2021-04-25 NOTE — Patient Instructions (Addendum)
Visit Information   Goals Addressed             This Visit's Progress    Monitor and Manage My Blood Sugar-Diabetes Type 2   On track    Timeframe:  Long-Range Goal Priority:  High Start Date:  02/20/21                           Expected End Date: 08/21/21                      Follow Up Date 05/21/21    - check blood sugar at prescribed times - check blood sugar if I feel it is too high or too low - enter blood sugar readings and medication or insulin into daily log - take the blood sugar log to all doctor visits    Why is this important?   Checking your blood sugar at home helps to keep it from getting very high or very low.  Writing the results in a diary or log helps the doctor know how to care for you.  Your blood sugar log should have the time, date and the results.  Also, write down the amount of insulin or other medicine that you take.  Other information, like what you ate, exercise done and how you were feeling, will also be helpful.     Notes: Work on limiting gatorades and ice cream!!!       Patient Care Plan: General Pharmacy (Adult)     Problem Identified: HTN, GERD, Hypothyroidism, Neuropathy, MDD, HLD, Type II DM   Priority: High  Onset Date: 02/20/2021     Long-Range Goal: Patient-Specific Goal   Start Date: 02/20/2021  Expected End Date: 08/21/2021  Recent Progress: On track  Priority: High  Note:   Current Barriers:  Unable to achieve control of glucose/BP  Suboptimal therapeutic regimen for cholesterol  Pharmacist Clinical Goal(s):  Patient will achieve improvement in A1c and LDL as evidenced by follow up labs through collaboration with PharmD and provider.   Interventions: 1:1 collaboration with [name], MD regarding development and update of comprehensive plan of care as evidenced by provider attestation and co-signature Inter-disciplinary care team collaboration (see longitudinal plan of care) Comprehensive medication review  performed; medication list updated in electronic medical record  Hypertension (BP goal <130/80) -Uncontrolled -Current treatment: Amlodipine 29m daily -Medications previously tried: losartan  -Current home readings: 167/60-70 -Current dietary habits: high sodium intake, high carb and sugar intake - see DM for specifics -Denies hypotensive/hypertensive symptoms -Educated on BP goals and benefits of medications for prevention of heart attack, stroke and kidney damage; Exercise goal of 150 minutes per week; Importance of home blood pressure monitoring; -Counseled to monitor BP at home a few times per week, document, and provide log at future appointments -Recommended to continue current medication Previous history of losartan with unknown d/c. Recent elevated BP in office as well as at home.  Already having some fluid retention with swelling noted in feet - would be hesitant to increase amlodipine for this reason.  CMA to follow up on BP in 30 days and we will assess at that time before adding new agents.  Hyperlipidemia: (LDL goal < 70) -Uncontrolled -Current treatment: Zetia 168mdaily Appropriate, Query effective, ,  -Medications previously tried: atorvastatin (leg pain)  -Current dietary patterns: see DM -Current exercise habits: minimal -Educated on Cholesterol goals;  Benefits of statin for ASCVD risk reduction;  Importance of limiting foods high in cholesterol; -Recommended to continue current medication Recommended patient come in for updated lipid panel (last was March 2021).  He does not remember any leg pain associated with Lipitor. If LDL remains elevated, would consider Crestor pending kidney labs or starting back Lipitor even at once or twice weekly to start and titrate as tolerated.  Update 04/25/21 LDL elevated ASCVD risk 24.3% - high risk.  He cannot tolerate statins in the past.  He missed his scheduled appointment with lipid clinic with cardiologist.  He needs to have  some kind of more advanced treatment for lipids to reduce CV risk.  Rescheduled visit with lipid clinic.  Would recommend Repatha or Praluent if patient is agreeable. Will halep with patient assistance if needed. FU on visit next month to determine if further intervention is needed. Continue to work on lifestyle mods.  Diabetes w/neuropathy (A1c goal <7%) -Uncontrolled -Current medications: Trulicity 4.5 TW/4.4QK Appropriate, Query effective, ,  Lantus 100 units every morning Appropriate, Effective, Novolog 15 units with breakfast and 25 units with supper Appropriate, Effective, Query Safe,  Farxiga 73m daily Appropriate, Effective, Safe, Accessible Lyrica 1591m2 capsules bid Appropriate, Effective, Safe, Accessible -Medications previously tried: metformin  -Current home glucose readings fasting glucose: 150s usually, 180 today post prandial glucose: 250 usually  -Reports hypoglycemic/hyperglycemic symptoms - mentions a few episodes of hypoglycemia per week where he will get dizzy and sweaty -Current meal patterns:  breakfast: N/A  lunch: frozen meals, ramen  dinner: frozen meals, ramen snacks: ice cream w/ pepsi float drinks: Regular gatorade, milk -Current exercise: minimal -Educated on A1c and blood sugar goals; Benefits of weight loss; Prevention and management of hypoglycemic episodes; Benefits of routine self-monitoring of blood sugar; -Counseled to check feet daily and get yearly eye exams -Counseled on diet and exercise extensively Recommended to continue current medication Currently I believe he could make big strides with simple improvements in his diet.  This would allow us[place]oreao further reduce insulin and prevent hypoglycemia.  Counseled on importance of regular meals and eating with all doses of insulin. Today we set a goal to limit gatorades to one per day as well as work to limit our servings of ice cream floats.  Patient agreeable to plan Will f/u in 60 days to assess  changes.  Update 04/24/21 165, 199, 212 are random glucose readings given to me today. A1c improved to 7.0 - he denies any hypoglycemia. He continues to work on diet cutting back on gatorades, ice cream floats.  He does report he drinks a substantial amount of milk - we discussed the carbohydrate and sugar content in this and he is going to try and cut back on milk.  Would caution on over basalization with Lantus insulin over 0.10m310mg.  We could increase the amount of meal time insulin based on meals and cut back on Lantus if he is experiencing episodes of hypoglycemia with controlled or elevated A1c. FU next month on glucose readings and diet changes with CMA.   Depression/Anxiety (Goal: Minimize symptoms) -Controlled -Current treatment: Trintellix 37m39mily Trintellix 10mg 56mly Rexulti 1mg d76my Alprazolam 1mg pr54mivactil 10mg ti18medications previously tried/failed: Pristiq -PHQ9:  PHQ9 SCORE ONLY 02/04/2021 12/27/2020 12/05/2020  PHQ-9 Total Score 11 11 7   -Sees Dr. RupinderChucky Mayer year for follow up. -Educated on Benefits of medication for symptom control -Recommended to continue current medication Would hesitate to make changes as he is seen by psychology - of note, Trintellix maximum  dose is 44m per day - will consult with Dr. KToy Careto make sure she is aware of this dose.  GERD (Goal: Minimize symptoms) -Controlled -Current treatment  Pantoprazole 460mdaily -Medications previously tried: none noted -Discussed appropriate timing of medication and difference between other options such as tums and Pepcid.  -Recommended to continue current medication Work on trigger foods and medication timing.  Hypothyroidism (Goal: Maintain TSH) -Controlled -Current treatment  Levothyroxine 505mdaily -Medications previously tried: none noted -Most recent TSH was WNL - he takes this medication in the evening usually with milk.  -Recommended to continue current  medication Counseled on appropriate timing of this medication, would hesitate to make big changes with TSH normal.  However if it continues to fluctuate he may need to make changes on medication timing to ensure adequate and consistent absorption.  HIV  -Controlled -Current treatment  Triumeq 600-50-300 daily -Medications previously tried: Stribild, GenJorje Guildtripla  -Recommended to continue current medication Assessed patient finances. [facility] [place]. Continue as prescribed.  Patient Goals/Self-Care Activities Patient will:  - take medications as prescribed as evidenced by patient report and record review check blood pressure a few times per week, document, and provide at future appointments engage in dietary modifications by limiting gatorades to one per day and work on cutting back on servings of ice cream  Follow Up Plan: The care management team will reach out to the patient again over the next 30 days.           The patient verbalized understanding of instructions, educational materials, and care plan provided today and declined offer to receive copy of patient instructions, educational materials, and care plan.  Telephone follow up appointment with pharmacy team member scheduled for: 3 months  ChrEdythe ClarityPHWahiawaharmD Clinical Pharmacist  [phone]

## 2021-04-25 NOTE — Progress Notes (Signed)
? ? ?Chronic Care Management ?Pharmacy Assistant  ? ?Name: Daniel [name]  MRN: [id number] DOB: 1955/12/12 ? ? ?Reason for Encounter: Disease State - Hypertension Call ?  ? ?Recent office visits:  ?04/10/21 [name], MD - Family Medicine - Chest Wall pain - Fall prevention discussed. Follow up as scheduled.  ? ?Recent consult visits:  ?04/11/21 Renato Shin, MD - Internal Medicine - Diabetes - Labs were ordered. Referral placed for diabetic education. No medication changes. Follow up in 2 months.  ? ?04/09/21 Vance Gather, MD - Infectious Disease - HIV - Labs were ordered. No medication changes. Follow up in 1 year.  ? ?Hospital visits:  ?None in previous 6 months ? ?Medications: ?Outpatient Encounter Medications as of 04/25/2021  ?Medication Sig Note  ? acetaminophen (TYLENOL) 500 MG tablet Take 1,500 mg by mouth in the morning and at bedtime.   ? ALPRAZolam (XANAX) 1 MG tablet Take 1 mg by mouth at bedtime. *May take one tablet up to 4 times daily as needed for anxiety   ? amLODipine (NORVASC) 5 MG tablet Take 1 tablet (5 mg total) by mouth daily.   ? amphetamine-dextroamphetamine (ADDERALL) 30 MG tablet Take 30 mg by mouth 3 (three) times daily. 02/20/2021: Patient 49m per day  ? B-D ULTRAFINE III SHORT PEN 31G X 8 MM MISC USE AS DIRECTED THREE TIMES DAILY   ? brexpiprazole (REXULTI) 1 MG TABS tablet Take 1 mg by mouth at bedtime.   ? Continuous Blood Gluc Receiver (FREESTYLE LIBRE 2 READER) DEVI Use as instructed to check blood sugars.   ? Continuous Blood Gluc Sensor (FREESTYLE LIBRE 14 DAY SENSOR) MISC USE AS DIRECTED EVERY 14 DAYS   ? Continuous Blood Gluc Sensor (FREESTYLE LIBRE 2 SENSOR) MISC Use as instructed to check blood sugar, change sensor every 10 days   ? dapagliflozin propanediol (FARXIGA) 10 MG TABS tablet Take 1 tablet (10 mg total) by mouth daily before breakfast.   ? diclofenac Sodium (VOLTAREN) 1 % GEL APPLY 2 GM TO EACH KNEE EVERY MORNING AND EVERY NIGHT AT BEDTIME AND 1 GM TO EACH KNEE IN  THE AFTERNOON   ? divalproex (DEPAKOTE ER) 500 MG 24 hr tablet TAKE 1 TABLET(500 MG) BY MOUTH AT BEDTIME   ? Dulaglutide (TRULICITY) 4.5 MDG/6.4QISOPN Inject 4.5 mg as directed once a week.   ? ezetimibe (ZETIA) 10 MG tablet TAKE 1 TABLET(10 MG) BY MOUTH DAILY   ? furosemide (LASIX) 40 MG tablet Take 1 tablet (40 mg total) by mouth daily.   ? insulin aspart (NOVOLOG FLEXPEN) 100 UNIT/ML FlexPen 15 units with breakfast and 25 units with supper.  And pen needles 3/day   ? insulin glargine (LANTUS SOLOSTAR) 100 UNIT/ML Solostar Pen Inject 100 Units into the skin every morning.   ? levothyroxine (SYNTHROID, LEVOTHROID) 50 MCG tablet Take 1 tablet (50 mcg total) by mouth at bedtime.   ? ondansetron (ZOFRAN) 8 MG tablet TAKE 1 TABLET(8 MG) BY MOUTH EVERY 8 HOURS AS NEEDED FOR NAUSEA OR VOMITING   ? pantoprazole (PROTONIX) 40 MG tablet Take 1 tablet (40 mg total) by mouth daily.   ? pregabalin (LYRICA) 150 MG capsule Take 2 capsules (300 mg total) by mouth 2 (two) times daily.   ? protriptyline (VIVACTIL) 10 MG tablet Take 10 mg by mouth 3 (three) times daily.    ? silodosin (RAPAFLO) 8 MG CAPS capsule Take 8 mg by mouth daily.   ? tamsulosin (FLOMAX) 0.4 MG CAPS capsule Take 1-2 capsules (0.4-0.8  mg total) by mouth daily.   ? TRINTELLIX 20 MG TABS tablet Take 20 mg by mouth at bedtime.    ? TRIUMEQ 600-50-300 MG tablet TAKE 1 TABLET BY MOUTH DAILY   ? XARELTO 20 MG TABS tablet TAKE 1 TABLET(20 MG) BY MOUTH DAILY WITH SUPPER   ? zolpidem (AMBIEN) 10 MG tablet Take 10 mg by mouth at bedtime.   ? ?No facility-administered encounter medications on file as of 04/25/2021.  ? ? ?Current antihypertensive regimen:  ?Amlodipine 17m daily ? ?How often are you checking your Blood Pressure?  ? ? ?Current home BP readings:  ? ? ?What recent interventions/DTPs have been made by any provider to improve Blood Pressure control since last CPP Visit:  ? ? ? ?Any recent hospitalizations or ED visits since last visit with CPP?  ?Patient has not  had any hospitalizations or ED visits since last visit with CPP.  ? ? ?What diet changes have been made to improve Blood Pressure Control?  ? ? ? ?What exercise is being done to improve your Blood Pressure Control?  ? ? ? ? ?Adherence Review: ?Is the patient currently on ACE/ARB medication? No ?Does the patient have >5 day gap between last estimated fill dates? No ? ? ?Care Gaps ?  ?AWV: needed  ?Colonoscopy: due 05/04/28 ?DM Eye Exam: overdue 02/05/21 ?DM Foot Exam: due 10/08/21 ?Microalbumin: done 12/05/20 ?HbgAIC: done 01/09/21 (7.2) ?DEXA: N/A ?Mammogram: N/A ? ? ?Star Rating Drugs: ?No Star Rating Drugs noted. ? ? ?Future Appointments  ?Date Time Provider [place] ?04/25/2021  2:00 PM CVD-NLINE PHARMACIST [facility]  ?05/02/2021  2:00 PM Scotece, AFrancesco Sor RD NDM-NMCH NDM  ?05/23/2021  8:30 AM Dohmeier, CAsencion Partridge MD GNA-GNA None  ?06/18/2021  3:15 PM ERenato Shin MD LBPC-LBENDO None  ?08/09/2021  4:20 PM RSkeet Latch MD DWB-CVD DWB  ?08/29/2021  2:30 PM MWard Givens NP GNA-GNA None  ? ?Patient had call with CPP this month / LS ? ?Liza Showfety, CCMA ?Clinical Pharmacist Assistant  ?[phone] ? ?

## 2021-04-25 NOTE — Telephone Encounter (Signed)
Please complete prior authorization for: ? ?Name of medication, dose, and frequency repatha 189m sq q 14 days ? ?Lab Orders Requested? yes ? ?Which labs? Lipid panel ? ?Estimated date for labs to be scheduled 2-3 months ? ?Does patient need activated copay card? no  ?

## 2021-04-25 NOTE — Patient Instructions (Addendum)
It was nice meeting you today ? ?We would like your LDL (bad cholesterol) to be less than 55 ? ?We will start a new medication called Repatha, which you will inject once every 2 weeks ? ?We will complete a prior authorization for you and contact you when it complete ? ?We will recheck your cholesterol in 2-3 months ? ?Please call with any questions ? ? ?Karren Cobble, PharmD, BCACP, [place], CPP ?[address], [address] ?[place], [place], [address] ?Phone: [phone], Fax: [phone]  ? ?

## 2021-04-25 NOTE — Progress Notes (Signed)
Patient ID: [name]                 DOB: Oct 28, 1955                    MRN: [id number] ? ? ? ? ?HPI: ?Daniel [name] is a 66 y.o. male patient referred to lipid clinic by Daniel [name]. PMH is significant for CKD, T2DM (A!c 7.0 on 04/11/21), HLD, HTN, HIV,  DVT, and chest pain. ? ?Patient presents today for cholesterol management.  Unsteady on feet. Has difficulty following linear conversation. Disorganized in speech and thought. Reports memory problems and uses a notebook to record important information. ? ?Does not follow a heart healthy or DM friendly diet. Reports frequent fast food meals including burgers, fries, and sweet tea. Says most of his meals are processed foods.  Does not monitor sodium intake. ? ?Has no feeling in either feet due to diabetic neuropathy whic limits his physcial mobility. Had toes amputated on right foot. However he reports he has been paying for a gym membership for last 3 years and has not gone yet. ? ?Sleep patterns are poor. Reports he stays up until past 4AM watching tv and then sleeps until noon. Does not like leaving his house.  Has difficulty hearing without his hearing aids which he did not bring to appt. ? ?Has a history of statin intolerance on his medication list. Chart shows patient has tried atorvastatin and pravastatin however he does not recall taking either of these or any adverse effects.  Currently managed on Zetia.  ? ?Current Medications: Zetia 61m ? ?Intolerances:  ?Atorvastatin ?Pravastatin ? ?Risk Factors:  ?HLD ?DM ?HTN ? ?LDL goal: <55 ? ?Labs:TC 215, Trigs 222, HDL 39, LDL 136 (on Zetia 16m ? ?Current ASCVD Risk Score: 44.6% ?High ?Current 10-Year ASCVD Risk** ? ?Past Medical History:  ?Diagnosis Date  ? ADHD (attention deficit hyperactivity disorder)   ? Anxiety   ? Chronic kidney disease   ? Clotting disorder ([facility]  ? Depression   ? Diabetes mellitus without complication ([facility]  ? Diabetes mellitus, type II ([facility]  ? Dizziness 03/17/2015  ?  Essential hypertension 06/25/2018  ? GERD (gastroesophageal reflux disease)   ? HIV disease ([facility]  ? HIV infection ([facility]  ? Liver disease   ? OSA (obstructive sleep apnea) 07/25/2015  ? Uses CPAP regularly  ? Peripheral vascular disease ([facility]  ? Ulcer   ? ? ?Current Outpatient Medications on File Prior to Visit  ?Medication Sig Dispense Refill  ? acetaminophen (TYLENOL) 500 MG tablet Take 1,500 mg by mouth in the morning and at bedtime.    ? ALPRAZolam (XANAX) 1 MG tablet Take 1 mg by mouth at bedtime. *May take one tablet up to 4 times daily as needed for anxiety    ? amLODipine (NORVASC) 5 MG tablet Take 1 tablet (5 mg total) by mouth daily. 180 tablet 3  ? amphetamine-dextroamphetamine (ADDERALL) 30 MG tablet Take 30 mg by mouth 3 (three) times daily.    ? B-D ULTRAFINE III SHORT PEN 31G X 8 MM MISC USE AS DIRECTED THREE TIMES DAILY 100 each 2  ? brexpiprazole (REXULTI) 1 MG TABS tablet Take 1 mg by mouth at bedtime.    ? Continuous Blood Gluc Receiver (FREESTYLE LIBRE 2 READER) DEVI Use as instructed to check blood sugars. 1 each 2  ? Continuous Blood Gluc Sensor (FREESTYLE LIBRE 14 DAY SENSOR) MISC USE AS DIRECTED EVERY  14 DAYS 6 each 3  ? Continuous Blood Gluc Sensor (FREESTYLE LIBRE 2 SENSOR) MISC Use as instructed to check blood sugar, change sensor every 10 days 6 each 3  ? dapagliflozin propanediol (FARXIGA) 10 MG TABS tablet Take 1 tablet (10 mg total) by mouth daily before breakfast. 90 tablet 3  ? diclofenac Sodium (VOLTAREN) 1 % GEL APPLY 2 GM TO EACH KNEE EVERY MORNING AND EVERY NIGHT AT BEDTIME AND 1 GM TO EACH KNEE IN THE AFTERNOON 300 g 0  ? divalproex (DEPAKOTE ER) 500 MG 24 hr tablet TAKE 1 TABLET(500 MG) BY MOUTH AT BEDTIME 90 tablet 0  ? Dulaglutide (TRULICITY) 4.5 TD/1.7OH SOPN Inject 4.5 mg as directed once a week. 6 mL 3  ? ezetimibe (ZETIA) 10 MG tablet TAKE 1 TABLET(10 MG) BY MOUTH DAILY 90 tablet 3  ? furosemide (LASIX) 40 MG tablet Take 1 tablet (40 mg total) by mouth daily. 90 tablet 3   ? insulin aspart (NOVOLOG FLEXPEN) 100 UNIT/ML FlexPen 15 units with breakfast and 25 units with supper.  And pen needles 3/day 45 mL 3  ? insulin glargine (LANTUS SOLOSTAR) 100 UNIT/ML Solostar Pen Inject 100 Units into the skin every morning. 105 mL 3  ? levothyroxine (SYNTHROID, LEVOTHROID) 50 MCG tablet Take 1 tablet (50 mcg total) by mouth at bedtime. 30 tablet 11  ? ondansetron (ZOFRAN) 8 MG tablet TAKE 1 TABLET(8 MG) BY MOUTH EVERY 8 HOURS AS NEEDED FOR NAUSEA OR VOMITING 20 tablet 1  ? pantoprazole (PROTONIX) 40 MG tablet Take 1 tablet (40 mg total) by mouth daily. 90 tablet 1  ? pregabalin (LYRICA) 150 MG capsule Take 2 capsules (300 mg total) by mouth 2 (two) times daily. 360 capsule 0  ? protriptyline (VIVACTIL) 10 MG tablet Take 10 mg by mouth 3 (three) times daily.   11  ? silodosin (RAPAFLO) 8 MG CAPS capsule Take 8 mg by mouth daily.    ? tamsulosin (FLOMAX) 0.4 MG CAPS capsule Take 1-2 capsules (0.4-0.8 mg total) by mouth daily. 30 capsule 0  ? TRINTELLIX 20 MG TABS tablet Take 20 mg by mouth at bedtime.     ? TRIUMEQ 600-50-300 MG tablet TAKE 1 TABLET BY MOUTH DAILY 30 tablet 5  ? XARELTO 20 MG TABS tablet TAKE 1 TABLET(20 MG) BY MOUTH DAILY WITH SUPPER 90 tablet 0  ? zolpidem (AMBIEN) 10 MG tablet Take 10 mg by mouth at bedtime.    ? ?No current facility-administered medications on file prior to visit.  ? ? ?Allergies  ?Allergen Reactions  ? Aspirin Swelling  ? Ibuprofen Swelling  ? Sustiva [Efavirenz] Swelling and Rash  ? Nsaids Other (See Comments)  ?  unknwn  ? Lipitor [Atorvastatin Calcium] Other (See Comments)  ?  Leg pain  ? ? ?Assessment/Plan: ? ?1. Hyperlipidemia - Patient current LDL 136 which is above goal of <55.  Patient at extreme risk of adverse cardiac event due to HLD, HTN, and DM.  Current 10 year risk score 44%.  ? ?Patient does not recall statin intolerance and is willing to try a different agent. Recommended however that due to significant decrease he needs, a PCSK9i would be  more beneficial with less possibility of adverse effects.  Patient voiced understanding. ? ?Using demo pen, educated patient on mechanism of action, storage, site selection, and administration. Patient voiced understanding. Will complete PA and contact patient when approved.  Repeat lipid panel in 2-3 months. ? ?Continue Zetia 7m daily ?Start Repatha 1449msq q  14 days ?Recheck lipid panel in 2-3 months ? ?Karren Cobble, PharmD, BCACP, [place], CPP ?[address], [address] ?[place], [place], [address] ?Phone: [phone], Fax: [phone]  ?

## 2021-04-25 NOTE — Telephone Encounter (Signed)
Called and lmom pt that they were approved for repatha, rx sent, pt instructed to call back if unaffordable and to complete fasting labs post 4th dose.  ?

## 2021-05-01 ENCOUNTER — Other Ambulatory Visit: Payer: Self-pay

## 2021-05-01 DIAGNOSIS — N1831 Chronic kidney disease, stage 3a: Secondary | ICD-10-CM

## 2021-05-01 DIAGNOSIS — E1122 Type 2 diabetes mellitus with diabetic chronic kidney disease: Secondary | ICD-10-CM

## 2021-05-01 MED ORDER — LANTUS SOLOSTAR 100 UNIT/ML ~~LOC~~ SOPN: 100.0000 [IU] | PEN_INJECTOR | SUBCUTANEOUS | 3 refills | Status: DC

## 2021-05-02 ENCOUNTER — Ambulatory Visit: Payer: PPO | Admitting: Skilled Nursing Facility1

## 2021-05-07 DIAGNOSIS — G4733 Obstructive sleep apnea (adult) (pediatric): Secondary | ICD-10-CM | POA: Diagnosis not present

## 2021-05-08 ENCOUNTER — Other Ambulatory Visit: Payer: Self-pay

## 2021-05-08 DIAGNOSIS — E1142 Type 2 diabetes mellitus with diabetic polyneuropathy: Secondary | ICD-10-CM

## 2021-05-08 MED ORDER — PREGABALIN 150 MG PO CAPS: 300.0000 mg | ORAL_CAPSULE | Freq: Two times a day (BID) | ORAL | 0 refills | Status: DC

## 2021-05-08 NOTE — Telephone Encounter (Signed)
Controlled substance database reviewed, Lyrica reordered. ?

## 2021-05-17 ENCOUNTER — Telehealth: Payer: Self-pay

## 2021-05-17 NOTE — Telephone Encounter (Signed)
Pt called in [place] for appointment he has made for Monday  ?Has been Dizzy and falling more frequently  ?

## 2021-05-17 NOTE — Telephone Encounter (Signed)
Patient called in stating he has been dizzy and has been falling more frequently. Declined triage but is scheduled for Monday. FYI.  ?

## 2021-05-20 ENCOUNTER — Other Ambulatory Visit: Payer: Self-pay | Admitting: Family Medicine

## 2021-05-20 ENCOUNTER — Ambulatory Visit: Payer: PPO | Admitting: Family Medicine

## 2021-05-20 ENCOUNTER — Other Ambulatory Visit: Payer: Self-pay | Admitting: Internal Medicine

## 2021-05-20 DIAGNOSIS — Z8669 Personal history of other diseases of the nervous system and sense organs: Secondary | ICD-10-CM

## 2021-05-20 DIAGNOSIS — G8929 Other chronic pain: Secondary | ICD-10-CM

## 2021-05-20 DIAGNOSIS — I824Y9 Acute embolism and thrombosis of unspecified deep veins of unspecified proximal lower extremity: Secondary | ICD-10-CM

## 2021-05-20 NOTE — Telephone Encounter (Signed)
Patient called in wanting to reschedule his appointment to May. Said the reason he made it for Monday was because someone called EMS and that this isnt a new issue that is has been happening for awhile. FYI ?

## 2021-05-20 NOTE — Telephone Encounter (Signed)
Noted. Thanks.

## 2021-05-20 NOTE — Telephone Encounter (Signed)
Change in appt  ?

## 2021-05-21 NOTE — Telephone Encounter (Signed)
Patient is requesting a refill of the following medications: ?Requested Prescriptions  ? ?Pending Prescriptions Disp Refills  ? diclofenac Sodium (VOLTAREN) 1 % GEL [Pharmacy Med Name: DICLOFENAC 1% GEL 100GM (RX)] 300 g 0  ?  Sig: APPLY 2 GM TO EACH KNEE EVERY MORNING AND EVERY NIGHT AT BEDTIME AND 1 GM TO EACH KNEE EVERY AFTERNOON  ? divalproex (DEPAKOTE ER) 500 MG 24 hr tablet [Pharmacy Med Name: DIVALPROEX EXTENDED RELEASE 500MG T] 90 tablet 0  ?  Sig: TAKE 1 TABLET(500 MG) BY MOUTH AT BEDTIME  ? ? ?Date of patient request: 05/21/21 ?Last office visit: 04/10/21 ?Date of last refill: 04/23/21, 01/15/21 ?Last refill amount: 300g, 90 ? ? ?

## 2021-05-21 NOTE — Telephone Encounter (Signed)
Called pt to discuss if he had reconsidered occupational therapy as this may help as well as discuss change/pregression of sxs ? ?No answer LM to call back  ?

## 2021-05-21 NOTE — Telephone Encounter (Addendum)
Noted.  I have recommended occupational therapy previously to evaluate for possible assistance in preventing falls as well as assistive devices.  Let me know if he has changed mind on that referral.  I would still recommend use of a walker or other assistive device to lessen risk of falls.  If he has had any increased falls or new symptoms I do recommend being seen right away. ?

## 2021-05-23 ENCOUNTER — Encounter: Payer: Self-pay | Admitting: Neurology

## 2021-05-23 ENCOUNTER — Ambulatory Visit: Payer: PPO | Admitting: Neurology

## 2021-05-23 VITALS — BP 135/73 | HR 99 | Ht 70.0 in | Wt 254.0 lb

## 2021-05-23 DIAGNOSIS — F5105 Insomnia due to other mental disorder: Secondary | ICD-10-CM | POA: Insufficient documentation

## 2021-05-23 DIAGNOSIS — Z9989 Dependence on other enabling machines and devices: Secondary | ICD-10-CM | POA: Diagnosis not present

## 2021-05-23 DIAGNOSIS — G4733 Obstructive sleep apnea (adult) (pediatric): Secondary | ICD-10-CM

## 2021-05-23 DIAGNOSIS — R269 Unspecified abnormalities of gait and mobility: Secondary | ICD-10-CM | POA: Diagnosis not present

## 2021-05-23 NOTE — Patient Instructions (Addendum)
Fall Prevention in the Home, Adult ?Falls can cause injuries and can happen to people of all ages. There are many things you can do to make your home safe and to help prevent falls. Ask for help when making these changes. ?What actions can I take to prevent falls? ?General Instructions ?Use good lighting in all rooms. Replace any light bulbs that burn out. ?Turn on the lights in dark areas. Use night-lights. ?Keep items that you use often in easy-to-reach places. Lower the shelves around your home if needed. ?Set up your furniture so you have a clear path. Avoid moving your furniture around. ?Do not have throw rugs or other things on the floor that can make you trip. ?Avoid walking on wet floors. ?If any of your floors are uneven, fix them. ?Add color or contrast paint or tape to clearly mark and help you see: ?Grab bars or handrails. ?First and last steps of staircases. ?Where the edge of each step is. ?If you use a stepladder: ?Make sure that it is fully opened. Do not climb a closed stepladder. ?Make sure the sides of the stepladder are locked in place. ?Ask someone to hold the stepladder while you use it. ?Know where your pets are when moving through your home. ?What can I do in the bathroom? ?  ?Keep the floor dry. Clean up any water on the floor right away. ?Remove soap buildup in the tub or shower. ?Use nonskid mats or decals on the floor of the tub or shower. ?Attach bath mats securely with double-sided, nonslip rug tape. ?If you need to sit down in the shower, use a plastic, nonslip stool. ?Install grab bars by the toilet and in the tub and shower. Do not use towel bars as grab bars. ?What can I do in the bedroom? ?Make sure that you have a light by your bed that is easy to reach. ?Do not use any sheets or blankets for your bed that hang to the floor. ?Have a firm chair with side arms that you can use for support when you get dressed. ?What can I do in the kitchen? ?Clean up any spills right away. ?If you  need to reach something above you, use a step stool with a grab bar. ?Keep electrical cords out of the way. ?Do not use floor polish or wax that makes floors slippery. ?What can I do with my stairs? ?Do not leave any items on the stairs. ?Make sure that you have a light switch at the top and the bottom of the stairs. ?Make sure that there are handrails on both sides of the stairs. Fix handrails that are broken or loose. ?Install nonslip stair treads on all your stairs. ?Avoid having throw rugs at the top or bottom of the stairs. ?Choose a carpet that does not hide the edge of the steps on the stairs. ?Check carpeting to make sure that it is firmly attached to the stairs. Fix carpet that is loose or worn. ?What can I do on the outside of my home? ?Use bright outdoor lighting. ?Fix the edges of walkways and driveways and fix any cracks. ?Remove anything that might make you trip as you walk through a door, such as a raised step or threshold. ?Trim any bushes or trees on paths to your home. ?Check to see if handrails are loose or broken and that both sides of all steps have handrails. ?Install guardrails along the edges of any raised decks and porches. ?Clear paths of anything that can  make you trip, such as tools or rocks. ?Have leaves, snow, or ice cleared regularly. ?Use sand or salt on paths during winter. ?Clean up any spills in your garage right away. This includes grease or oil spills. ?What other actions can I take? ?Wear shoes that: ?Have a low heel. Do not wear high heels. ?Have rubber bottoms. ?Feel good on your feet and fit well. ?Are closed at the toe. Do not wear open-toe sandals. ?Use tools that help you move around if needed. These include: ?Canes. ?Walkers. ?Scooters. ?Crutches. ?Review your medicines with your doctor. Some medicines can make you feel dizzy. This can increase your chance of falling. ?Ask your doctor what else you can do to help prevent falls. ?Where to find more information ?Centers for  Disease Control and Prevention, [facility]: http://www.wolf.info/ ?[employer] on Aging: http://kim-miller.com/ ?Contact a doctor if: ?You are afraid of falling at home. ?You feel weak, drowsy, or dizzy at home. ?You fall at home. ?Summary ?There are many simple things that you can do to make your home safe and to help prevent falls. ?Ways to make your home safe include removing things that can make you trip and installing grab bars in the bathroom. ?Ask for help when making these changes in your home. ?This information is not intended to replace advice given to you by your health care provider. Make sure you discuss any questions you have with your health care provider. ?Document Revised: 09/14/2019 Document Reviewed: 09/14/2019 ?Elsevier Patient Education ? [place] ?? 2022 Elsevier/Gold Standard (2019-08-16 00:00:00) ?Core Strength Exercises ?Ask your health care provider which exercises are safe for you. Do exercises exactly as told by your health care provider and adjust them as directed. It is normal to feel mild stretching, pulling, tightness, or discomfort as you do these exercises. Stop right away if you feel sudden pain or your pain gets worse. Do not begin these exercises until told by your health care provider. ?Benefits of core strength exercises ?Core exercises help to build strength in the muscles between your ribs and your hips (abdominal muscles). These muscles help to support your body and keep your spine stable. It is important to maintain strength in your core to prevent injury and pain. ?Some activities, such as yoga and Pilates, can help to strengthen core muscles. You can also strengthen core muscles with exercises at home. It is important to talk to your health care provider before you start a new exercise routine. ?Core strength exercises can: ?Reduce back pain. ?Help to rebuild strength after a back or spine injury. ?Help to prevent injury during physical activity, especially injuries to the  back, hips, and knees. ?How to do core strength exercises ?Repeat these exercises 10-15 times, or until you are tired. Stop if you feel any pain while doing these exercises. Contact your health care provider if your pain continues or gets worse while doing or after doing core strength exercises. ?For strength exercises that are done on the floor, use a padded yoga mat or an exercise mat. ?Bridging ? ?Lie on your back on a firm surface with your knees bent and your feet flat on the floor. ?Raise your hips so that your knees, hips, and shoulders together form a straight line. Do not excessively arch your back. Keep your abdominal muscles tight. ?Hold this position for 3-5 seconds. ?Slowly lower your hips to the starting position. ?Let your muscles relax completely between repetitions. ?Single-leg bridge ? ?Lie on your back on a firm surface with  your knees bent and your feet flat on the floor. ?Raise your hips so that your knees, hips, and shoulders together form a straight line. Do not excessively arch your back. Keep your abdominal muscles tight. ?Lift one foot off the floor while maintaining alignment in your knees, hips, and shoulders. Then, completely straighten the lifted leg. ?Hold this position for 3-5 seconds. ?Put the straight leg back down in the bent position. ?Slowly lower your hips to the starting position. ?Repeat these steps using your other leg. ?Side bridge ? ?Lie on your side with your knees bent. Prop yourself up on the elbow that is near the floor. ?Using your abdominal muscles and the elbow you are propped up on, raise your body off the floor. Raise your hip so that your shoulder, hip, and foot together form a straight line. ?Hold this position for 10 seconds. Keep your head and neck raised and away from your shoulder (in their normal, neutral position). Keep your abdominal muscles tight. ?Slowly lower your hip to the starting position. ?Repeat and try to hold this position longer, working your  way up to 30 seconds. ?Abdominal crunch ? ?Lie on your back on a firm surface. Bend your knees and keep your feet flat on the floor. ?Cross your arms over your chest. ?Without bending your neck, tip your

## 2021-05-23 NOTE — Progress Notes (Signed)
?SLEEP MEDICINE CLINIC ? ? ?Provider:  Larey Seat, M D  ?Referring Provider: [name], MD/ Marcial Pacas, MD PhD ?Primary Care Physician:  [name], MD ? ?Chief Complaint  ?Patient presents with  ? Obstructive Sleep Apnea  ?  Rm 11, alone. Here for initial CPAP f/u. Pt reports doing well on CPAP. Pt waking up every 2-4 hrs due to taking lasix.   ? ?RV 05-23-2021,  ? ?Daniel. Daniel [name] is a chronic insomnia patient who is followed here for CPAP compliance and OSA- I do not treat his insomnia, it is non-organic in origin. Dr Toy Care follows.  I had changed the  the autotitration capacity setting to 8- 18 cm water, 3 cm EPR with heated humidity in 2021. I think he will need to continue using a FFM, evan that a seal is elusive , given his facial hair as he couldn't tolerate nasal mask or pillow interfaces.  ?He underwent a new home sleep test on 10/08/2020.  He  had severe sleep apnea but it was a normal REM sleep dominant apnea, his AHI in non-REM sleep was 43.3 and during REM sleep 11.1/h this is highly unusual.  Total sleep time was 6 hours and 42 minutes with 10.6% REM sleep time.  He did not have significant oxygen desaturations only 2 minutes of total hypoxia time were recorded, sleep appeared to be fragmented.  He slept most of the night in supine position with a high AHI of 45.6 but left or right-sided sleep did not reduce his apnea very much.  Snoring accompanied 30% of total sleep time.  He was ordered a new auto titration machine through [facility].  He uses machine 100% of 30 days the average user time is 11 hours and 4 minutes.  The machine is set between 6 and 16 cmH2O with 1 cm EPR his residual AHI is 5.8 and there is no significant central apnea component.  95th percentile is 15.5 cm water so he is already straddling the upper setting.  Air leak is moderate.  Due to his delayed sleep phase he is usually falling asleep between 2 and 3 in the morning and will wake up sometimes in the afternoons.   So he has not established a rise time or set a time to not go back to nap.  I looked at his medication list today there is Xanax as needed for anxiety there is Adderall 30 mg 3 times daily and I wonder if that interferes significantly with his sleep. ? ?At one time he took 120 mg adderall a day.  ?Dr Toy Care is reducing this to 60 mg daily, ?He should take it at set rise time, 8-9 AM and may be another at or after lunch- sleep hygiene.  ?He needs to get day light exposure- I jokingly told him to walk his friend's dog.  ? ?Daniel [name] will be followed for OSA/sleep only- not general neurology- for any general neurology he will need to see Dr [name].  ? ? ?RV 08-24-2019, Primary Neurologist Dr [name].  ?I have the pleasure of meeting today again with Daniel [name], a 66 year old caucasian gentleman, who has been referred for migraine treatment to [facility] originally and was followed  By [name] and in 2017 became a sleep apnea patient of mine.   ?Primary care is still Dr. Cindee Lame.  ? The patient has been using CPAP compliantly 97% with an average use at time of 9 hours and 20 minutes,  his minimum pressure setting of 6 maximum pressure 16 and 2 cm expiratory pressure relief are provided his residual AHI is 4.8/h. ? the majority is still obstructive when looking at the breakdown.  There are only 0.1 central apnea and 0 point point unknown apnea per hour the air leak is moderately to high at 95th percentile 20.7 L/min and the 95th percentile pressure is 15.6 cm water , so it exceeds what is currently the limit on his autotitrator.  ? His serial number is [id number]. ? he had been followed by [facility]-  on [place] and would like to change to [employer].  There is also an issue with billing and costs that seem to be met preferably with [employer].   ?I looked at the therapy report in detail and the air leak was actually worse in the first week of June and since then has been reduced and  not sure what kind of explanation there is.  The AHI varies greatly: many nights it is under 2/h other nights it may be above 10/h. The  Patient's facial hair prevents a FFM seal- he know this very well. ?Reportedly he has had [place] stroke - he is also chronically anticoagulated and has not had recent blood clots.  ?HIV status positive.  ? ?abacavir-dolutegravir-lamiVUDine (TRIUMEQ) 600-50-300 MG tablet 30 tablet 0 08/03/2019   ?Take 1 tablet by mouth daily. - Oral  ?.05/23/21 Larey Seat, MD ? ? ?HPI: 05-25-2019, patient last seen by Debbora Presto, NP Daniel [name] returns today for evaluation of OSA on CPAP therapy.  Unfortunately, orders placed by Dr. Brett Fairy in September 2020 were not put into place.  He is still using the same facemask.  Pressure settings were not changed.  Residual AHI remains elevated despite excellent compliance.  I will resend these orders today.  He was advised that if he does not hear back from [facility] in the next week he should contact my office.  We will bring him back in for evaluation in 3 months to assess AHI.  May consider titration study if needed.  He was encouraged to continue using CPAP nightly and for greater than 4 hours each night.  He verbalizes understanding and agreement with this plan. ? ? ? ?RV 11-17-2018  Daniel [name] is a 66 y.o. male , was originally seen here as a referral from Dr. Carlota Raspberry for a sleep consultation, he is also followed by [name] for Migraine headaches. The patient is HIV positive and adheres to social distancing COVID guidelines. He lives with a 74 year old aunt and brother.  ?Daniel. [name] reports that over 14 years ago he had a sleep study and he was prescribed a CPAP machine. ?Daniel [name] underwent a sleep study on the my guidance on June 17, 2015 which confirmed the presence of moderate sleep apnea at an AHI of 20.2/h strongly accentuated in supine sleep to 36.3/h.  He was titrated to only 5 cmH2O which  but unable to initiate sleep in the lab at all.  He  took 2 generic ambiens each night at home. Now on belsomra. Has nocturia 4-5 times at night.  We therefore ordered an auto titration device at the time and this is a follow up on compliance. ?I will change his settings to 8-18 cm water with 2 cm EPR and new supplies, his facial hair may make air seal impossible to achieve, especially with a FFM. He is not shaven, the beard is  not shortly trimmed.  ? ? ?  ?Daniel [name] is an established patient of Dr. Toy Care and has seen [name] for the last 8 years . He has a history of diabetes mellitus, chronic depression, HIV infection, blood clotting disorder foot drop and ADHD. He is followed for chronic renal insufficiency by a nephrologist- his kidney disease has been stable.  ?He is currently taking Xanax; codeine, Adderall, Lipitor he checks his blood sugars at home he takes Synthroid 75 ?g, Antivert when necessary was given in February a prescription for oxycodone, has Compazine, ranitidine, Xarelto, 20 Lex, Ambien.  ? ?His review of system is positive for frequency of urination, heat intolerance, light sensitivity, shortness of breath and coughing, he had recently a DVT which caused his leg to swell. Has a history of apnea treated with a CPAP machine that is by now very old, and has recurrent daytime sleepiness issues. The currnet CPAP machine is from 2017, produces a high ai rleak and residual AHI of 11. 4/h  ? ? ?Daniel [name] has been 100% compliant use of CPAP with an average of over 12 hours of daily use, CPAP is set at 14 cmH2O pressure was 2 cm EPR, but currently he has a very high residual apnea index the AHI is 11.4 and this may be related to high air leakage with a maximum of 34 L/min.  I did not see any evidence of central apneas only obstructive apneas he still seems to snore sometimes through his CPAP. ? ?His Mallompatti is still 3-4 , neck size 20" .BMI 34.79 - lost weight.  ? ? ?High geriatric depression score at 12/ 15 points- severely depressed. ? ?How  likely are you to doze in the following situations: ?0 = not likely, 1 = slight chance, 2 = moderate chance, 3 = high chance ? ?Sitting and Reading? ?Watching Television? ?Sitting inactive in a public place (

## 2021-05-23 NOTE — Telephone Encounter (Signed)
Noted  

## 2021-05-23 NOTE — Telephone Encounter (Signed)
Patient returned call from [facility], I informed him of the info given by Dr Carlota Raspberry. He stated that at this time he would seek OT through his Neuro Dr. He stated he would give [name] a call if that didn't work for him ?

## 2021-05-24 ENCOUNTER — Telehealth: Payer: Self-pay | Admitting: Family Medicine

## 2021-05-24 ENCOUNTER — Ambulatory Visit: Payer: PPO

## 2021-05-24 DIAGNOSIS — Z794 Long term (current) use of insulin: Secondary | ICD-10-CM

## 2021-05-24 DIAGNOSIS — N183 Chronic kidney disease, stage 3 unspecified: Secondary | ICD-10-CM

## 2021-05-24 DIAGNOSIS — E1122 Type 2 diabetes mellitus with diabetic chronic kidney disease: Secondary | ICD-10-CM | POA: Diagnosis not present

## 2021-05-24 DIAGNOSIS — E785 Hyperlipidemia, unspecified: Secondary | ICD-10-CM

## 2021-05-24 NOTE — Telephone Encounter (Signed)
He does have x-rays that have already been performed in his chart of his right and left knee from 2019, right knee from 2021, pelvis July 2022.  Lumbar spine July 2022.  Would recommend chiropractor decide on any specific imaging needs through their office if updated imaging needed from those prior studies.  Otherwise it may be best that he has an appointment to discuss the specific areas involved for me to order any updated studies.  Thanks.  ?

## 2021-05-24 NOTE — Telephone Encounter (Signed)
Called and requested name of chiro office to send previous scans  ?

## 2021-05-24 NOTE — Telephone Encounter (Signed)
Patient called and stated that he has a chiropractor appt on Monday and they asked for him to have xrays prior. They referred him to his PCP to have those ordered. It would need to be for his knees, back  (upper and lower) and hips. Please let me know if this can be ordered ?

## 2021-05-27 ENCOUNTER — Telehealth: Payer: Self-pay | Admitting: Pharmacist

## 2021-05-27 NOTE — Progress Notes (Signed)
? ? ?Chronic Care Management ?Pharmacy Assistant  ? ?Name: Daniel [name]  MRN: [id number] DOB: 10-May-1955 ? ? ?Reason for Encounter: Disease State - Hypertension Call  ?  ? ?Recent office visits:  ?None noted.  ? ?Recent consult visits:  ?05/23/21 Larey Seat, MD - Neurology - OSA - Referral placed to Orthopedics. Follow up as scheduled.  ? ?04/25/21 Rollen Sox, MD - Cardiology - Atherosclerosis of native artery - start a new medication called Repatha, which you will inject once every 2 weeks. Follow up and recheck cholesterol in 2-3 months.  ? ?Hospital visits:  ?None in previous 6 months ? ?Medications: ?Outpatient Encounter Medications as of 05/27/2021  ?Medication Sig Note  ? acetaminophen (TYLENOL) 500 MG tablet Take 1,500 mg by mouth in the morning and at bedtime.   ? ALPRAZolam (XANAX) 1 MG tablet Take 1 mg by mouth at bedtime. *May take one tablet up to 4 times daily as needed for anxiety   ? amLODipine (NORVASC) 5 MG tablet Take 1 tablet (5 mg total) by mouth daily.   ? amphetamine-dextroamphetamine (ADDERALL) 30 MG tablet Take 30 mg by mouth 3 (three) times daily. 02/20/2021: Patient 62m per day  ? B-D ULTRAFINE III SHORT PEN 31G X 8 MM MISC USE AS DIRECTED THREE TIMES DAILY   ? brexpiprazole (REXULTI) 1 MG TABS tablet Take 1 mg by mouth at bedtime.   ? Continuous Blood Gluc Receiver (FREESTYLE LIBRE 2 READER) DEVI Use as instructed to check blood sugars.   ? dapagliflozin propanediol (FARXIGA) 10 MG TABS tablet Take 1 tablet (10 mg total) by mouth daily before breakfast.   ? diclofenac Sodium (VOLTAREN) 1 % GEL APPLY 2 GM TO EACH KNEE EVERY MORNING AND EVERY NIGHT AT BEDTIME AND 1 GM TO EACH KNEE EVERY AFTERNOON   ? divalproex (DEPAKOTE ER) 500 MG 24 hr tablet TAKE 1 TABLET(500 MG) BY MOUTH AT BEDTIME   ? Dulaglutide (TRULICITY) 4.5 MOV/5.6EPSOPN Inject 4.5 mg as directed once a week.   ? Evolocumab (REPATHA SURECLICK) 1329MG/ML SOAJ Inject 140 mg into the skin every 14 (fourteen) days.   ?  ezetimibe (ZETIA) 10 MG tablet TAKE 1 TABLET(10 MG) BY MOUTH DAILY   ? furosemide (LASIX) 40 MG tablet Take 1 tablet (40 mg total) by mouth daily.   ? insulin aspart (NOVOLOG FLEXPEN) 100 UNIT/ML FlexPen 15 units with breakfast and 25 units with supper.  And pen needles 3/day   ? insulin glargine (LANTUS SOLOSTAR) 100 UNIT/ML Solostar Pen Inject 100 Units into the skin every morning.   ? levothyroxine (SYNTHROID, LEVOTHROID) 50 MCG tablet Take 1 tablet (50 mcg total) by mouth at bedtime.   ? ondansetron (ZOFRAN) 8 MG tablet TAKE 1 TABLET(8 MG) BY MOUTH EVERY 8 HOURS AS NEEDED FOR NAUSEA OR VOMITING   ? pantoprazole (PROTONIX) 40 MG tablet Take 1 tablet (40 mg total) by mouth daily.   ? pregabalin (LYRICA) 150 MG capsule Take 2 capsules (300 mg total) by mouth 2 (two) times daily.   ? protriptyline (VIVACTIL) 10 MG tablet Take 10 mg by mouth 3 (three) times daily.    ? silodosin (RAPAFLO) 8 MG CAPS capsule Take 8 mg by mouth daily.   ? TRINTELLIX 20 MG TABS tablet Take 20 mg by mouth at bedtime.    ? TRIUMEQ 600-50-300 MG tablet TAKE 1 TABLET BY MOUTH DAILY   ? XARELTO 20 MG TABS tablet TAKE 1 TABLET(20 MG) BY MOUTH DAILY WITH SUPPER   ? zolpidem (  AMBIEN) 10 MG tablet Take 10 mg by mouth at bedtime.   ? ?No facility-administered encounter medications on file as of 05/27/2021.  ? ? ?Current antihypertensive regimen:  ?Amlodipine 69m daily ?Silodosin (RAPAFLO) 8 MG CAPS capsule 1 daily  ? ?How often are you checking your Blood Pressure?  ? Patient reported he has not been checking his blood pressures at home lately. ?  ?Current home BP readings: patient did not have any readings to report currently ?  ?  ?What recent interventions/DTPs have been made by any provider to improve Blood Pressure control since last CPP Visit:  ? Patient denied any recent changes to his current medication regimen.  ?  ?  ?Any recent hospitalizations or ED visits since last visit with CPP?  ?Patient has not had any hospitalizations or ED visits  since last visit with CPP.  ?  ?  ?What diet changes have been made to improve Blood Pressure Control?  ? Patient reported he does not add salt to any of his meals.  ?  ?  ?What exercise is being done to improve your Blood Pressure Control?  ? Patient reported he is not currently exercising.  ?He reported he had an appt at the chiropractor today at 3 pm to try and help with his back pain.  ?  ?  ?Adherence Review: ?Is the patient currently on ACE/ARB medication? No ?Does the patient have >5 day gap between last estimated fill dates? No ?  ?  ?Care Gaps ?  ?AWV: needed  ?Colonoscopy: due 05/04/28 ?DM Eye Exam: overdue 02/05/21 ?DM Foot Exam: due 10/08/21 ?Microalbumin: done 12/05/20 ?HbgAIC: done 04/11/21 (7.0) ?DEXA: N/A ?Mammogram: N/A ?  ?  ?Star Rating Drugs: ?No Star Rating Drugs noted. ? ? ?Future Appointments  ?Date Time Provider [place] ?05/29/2021  3:30 PM VBary Richard PT [facility]-NR OPRCNR  ?05/30/2021  3:15 PM SCaffie DammeAFrancesco Sor RD [place]  ?06/18/2021  3:15 PM ERenato Shin MD [facility]-LBENDO None  ?07/01/2021  3:00 PM GWendie Agreste MD [facility]-SV PEC  ?08/09/2021  4:20 PM RSkeet Latch MD DW[facility]-CVD DWB  ?08/21/2021  9:00 AM [facility]-SV CCM PHARMACIST [facility]SV PEC  ?05/26/2022  3:00 PM MWard Givens NP GNA-GNA None  ? ? ? ?Liza Showfety, CCMA ?Clinical Pharmacist Assistant  ?[phone] ? ?

## 2021-05-28 ENCOUNTER — Other Ambulatory Visit: Payer: Self-pay

## 2021-05-28 DIAGNOSIS — Z794 Long term (current) use of insulin: Secondary | ICD-10-CM

## 2021-05-28 MED ORDER — LANTUS SOLOSTAR 100 UNIT/ML ~~LOC~~ SOPN: 100.0000 [IU] | PEN_INJECTOR | SUBCUTANEOUS | 3 refills | Status: DC

## 2021-05-29 ENCOUNTER — Ambulatory Visit: Payer: PPO | Attending: Family Medicine | Admitting: Physical Therapy

## 2021-05-29 ENCOUNTER — Encounter: Payer: Self-pay | Admitting: Physical Therapy

## 2021-05-29 DIAGNOSIS — R2689 Other abnormalities of gait and mobility: Secondary | ICD-10-CM

## 2021-05-29 DIAGNOSIS — M6281 Muscle weakness (generalized): Secondary | ICD-10-CM | POA: Diagnosis not present

## 2021-05-29 DIAGNOSIS — M545 Low back pain, unspecified: Secondary | ICD-10-CM | POA: Diagnosis not present

## 2021-05-29 DIAGNOSIS — G8929 Other chronic pain: Secondary | ICD-10-CM | POA: Diagnosis not present

## 2021-05-29 DIAGNOSIS — R2681 Unsteadiness on feet: Secondary | ICD-10-CM | POA: Diagnosis not present

## 2021-05-29 NOTE — Therapy (Addendum)
?OUTPATIENT PHYSICAL THERAPY NEURO EVALUATION ? ? ?Patient Name: Daniel [name] [facility] ?MRN: [id number] ?DOB:06-Jun-1955, 66 y.o., male ?Today's Date: 05/29/2021 ? ?PCP: [name], MD ?REFERRING PROVIDER: [name], MD ? ? ? 05/29/21 1558  ?PT Visits / Re-Eval  ?Visit Number 1  ?Number of Visits 9 ?(8+eval)  ?Date for PT Re-Evaluation 07/26/21  ?Authorization  ?Authorization Type [employer]  ?PT Time Calculation  ?PT Start Time 1550  ?PT Stop Time 6378  ?PT Time Calculation (min) 55 min  ?PT - End of Session  ?Equipment Utilized During Treatment Gait belt  ?Activity Tolerance Patient tolerated treatment well  ?Behavior During Therapy Restless;Agitated  ? ? ?Past Medical History:  ?Diagnosis Date  ? ADHD (attention deficit hyperactivity disorder)   ? Anxiety   ? Chronic kidney disease   ? Clotting disorder ([place])   ? Depression   ? Diabetes mellitus without complication ([place])   ? Diabetes mellitus, type II ([place])   ? Dizziness 03/17/2015  ? Essential hypertension 06/25/2018  ? GERD (gastroesophageal reflux disease)   ? HIV disease ([place])   ? HIV infection ([place])   ? Liver disease   ? OSA (obstructive sleep apnea) 07/25/2015  ? Uses CPAP regularly  ? Peripheral vascular disease ([place])   ? Ulcer   ? ?Past Surgical History:  ?Procedure Laterality Date  ? AMPUTATION Right 10/02/2017  ? Procedure: RIGHT TRANSMETATARSAL AMPUTATION;  Surgeon: Leandrew Koyanagi, MD;  Location: [facility];  Service: Orthopedics;  Laterality: Right;  ? SMALL INTESTINE SURGERY    ? STOMACH SURGERY    ? TOE AMPUTATION Right 08/2016  ? right great toe  ? ?Patient Active Problem List  ? Diagnosis Date Noted  ? Insomnia due to mental condition 05/23/2021  ? Atherosclerosis of native artery of extremity ([place]) 04/25/2021  ? Chronic nonalcoholic liver disease 58/85/0277  ? History of hepatitis B 04/25/2021  ? HIV positive ([place]) 04/25/2021  ? Long term (current) use of insulin ([place]) 04/25/2021  ? Long term (current) use of anticoagulants 04/25/2021  ?  Nonalcoholic steatohepatitis ([place]) 04/25/2021  ? Type 2 diabetes mellitus with other circulatory complications ([place]) 41/28/7867  ? Type 2 diabetes mellitus with peripheral angiopathy ([place]) 04/25/2021  ? Abnormal liver function tests 04/25/2021  ? Statin myopathy 02/12/2021  ? Unilateral primary osteoarthritis, right knee 12/15/2019  ? Generalized weakness 09/14/2019  ? Severe major depression ([place]) 11/17/2018  ? Class 2 obesity due to excess calories without serious comorbidity with body mass index (BMI) of 36.0 to 36.9 in adult 11/17/2018  ? Anticoagulated 08/24/2018  ? Dyslipidemia 07/27/2018  ? Essential hypertension 06/25/2018  ? Hypogonadism in male 05/10/2018  ? Numbness 03/31/2018  ? Diabetic peripheral neuropathy ([place]) 01/25/2018  ? S/P transmetatarsal amputation of foot, right ([place]) 10/09/2017  ? Hypothyroidism   ? Constipation   ? Obesity, Class III, BMI 40-49.9 (morbid obesity) ([place]) 01/15/2017  ? DOE (dyspnea on exertion) 01/13/2017  ? Imbalance 11/18/2016  ? Osteomyelitis of right foot ([place]) 09/22/2016  ? Chronic migraine 09/17/2016  ? Gait abnormality 09/17/2016  ? Fall 12/05/2015  ? Toe ulcer, right ([place]) 09/19/2015  ? Decreased pedal pulses 09/19/2015  ? OSA on CPAP 09/05/2015  ? Major depressive disorder, recurrent episode, moderate ([place]) 09/05/2015  ? Recurrent falls while walking 06/24/2015  ? H/O migraine 06/22/2015  ? Sinus headache 06/21/2015  ? OSA (obstructive sleep apnea) 06/20/2015  ? History of DVT (deep vein thrombosis) 06/20/2015  ? History of falling 06/20/2015  ? Chronic, continuous  use of opioids 06/20/2015  ? Coagulopathy ([place]) 06/20/2015  ? Elevated CPK 06/20/2015  ? Pain syndrome, chronic 06/20/2015  ? Thrombocythemia 06/20/2015  ? Uncontrolled type 2 diabetes mellitus with hyperglycemia ([place]) 06/20/2015  ? Low back pain 06/11/2015  ? Unspecified abnormalities of gait and mobility 06/11/2015  ? Benign paroxysmal positional vertigo of right ear 05/01/2015  ? Chronic kidney disease,  stage III (moderate) ([place]) 08/09/2014  ? Renal insufficiency 08/09/2014  ? Insulin-requiring or dependent type II diabetes mellitus ([place]) 11/07/2013  ? Diabetes mellitus ([place]) 11/07/2013  ? Steatosis of liver 09/09/2010  ? Human immunodeficiency virus (HIV) infection ([place]) 06/04/2006  ? Herpes zoster 06/04/2006  ? Depression 06/04/2006  ? THROMBOPHLEBITIS NOS 06/04/2006  ? Gastroesophageal reflux disease 06/04/2006  ? ARTHRITIS, HAND 06/04/2006  ? Attention deficit hyperactivity disorder 06/04/2006  ? Abnormal blood chemistry level 06/04/2006  ? ? ?ONSET DATE: 05/23/2021 (Date of referral) ? ?REFERRING DIAG: G47.33,Z99.89 (ICD-10-CM) - OSA on CPAP F51.05 (ICD-10-CM) - Insomnia due to mental condition R26.9 (ICD-10-CM) - Unspecified abnormalities of gait and mobility  ? ?THERAPY DIAG:  ?Other abnormalities of gait and mobility ? ?Muscle weakness (generalized) ? ?Unsteadiness on feet ? ?SUBJECTIVE:  ?VITALS:  Assessed BP LUE in sitting EOM prior to session:  142/78                                                                                                                                                                                    ? ?SUBJECTIVE STATEMENT: ?"What I'm here for is to get my core back after I was in a wheelchair 3-4 years ago when I got my toes amputated.  I never got proper PT for it, well I had PT but quit early because I didn't think what they were doing was helping."  Pt states he does not manage DM2 well with diet, but takes pills, he does have neuropathy.  He yesterday in the front yard after tripping over grass clippings and fell backwards 2 nights ago when he was changing bed sheets.  He was unable to push himself up and needs help to get off ground.  Pt states he has had 2 periods of time in his life where he "drank like a fish", second time period lasted 10-15 years, he stopped drinking 23 years ago.  Pt states "I had a stroke several years ago that the doctor found when they were  scanning me for something else.  They said it was a part of the brain that didn't matter though." ?Pt accompanied by: self ? ?PERTINENT HISTORY: R transmetatarsal amputation 10/02/2017, R great toe amputation 08/2016, ADHD, Anxiety, CKD, Clotting disorder, Depression, DM2, Dizziness, HTN, GERD,  HIV, Liver disease, Obstructive sleep apnea, PVD ? ?PAIN:  ?Are you having pain? No ? ?PRECAUTIONS: Fall;  Pt states he is color blind and wears reading glasses.  He also wears hearing aids bilaterally.  Both hearing aids are broken currently. ? ?WEIGHT BEARING RESTRICTIONS No ? ?FALLS: Has patient fallen in last 6 months? Yes. Number of falls 10-15 ?States he is never dizzy when he falls. ? ?LIVING ENVIRONMENT: ?Lives with: lives alone and he lives in basement of friends home ?Lives in: House/apartment ?Stairs: Yes: Internal: 15 steps; can reach both ?Has following equipment at home: Single point cane and shower chair ? ?PLOF: Independent ? ?PATIENT GOALS "My goal is to build up the muscle strength in my legs and in my shoulders because the worse thing I had was falling and I could not get up." ? ?OBJECTIVE:  ? ?DIAGNOSTIC FINDINGS: No relevant imaging. ? ?COGNITION: ?Overall cognitive status: Impaired: Attention: Deficits easily distractible ?Memory: Deficits repeats freqently, reported difficulty remembering  ?Awareness: Deficits decreased safety awareness and insight to deficits ?Executive function:Sequencing, Self-monitoring, and Self-correcting and No family/caregiver present to determine baseline cognitive functioning ?  ?SENSATION: ?Light touch: Impaired  and reports worse distally in feet due to neuropathy ? ?COORDINATION: ?Heel-to-shin: WFL ?LE RAMPS: WFL ? ?EDEMA:  ?Pt states he has occasional edema in ankles, mild most of the time. ? ?MUSCLE TONE: Not formally assessed. ? ?DTRs:  ?Patella 1 = Trace bilaterally ? ?POSTURE: anterior pelvic tilt and slight posterior lean ? ?LE ROM:    ? ?Active  Right ?05/29/2021  Left ?05/29/2021  ?Hip flexion [facility] WFL  ?Hip extension    ?Hip abduction [facility] WFL  ?Hip adduction    ?Hip internal rotation    ?Hip external rotation    ?Knee flexion [facility] WFL  ?Knee extension [facility] WFL  ?Ankle dorsifl

## 2021-05-30 ENCOUNTER — Encounter: Payer: PPO | Attending: Endocrinology | Admitting: Skilled Nursing Facility1

## 2021-05-30 DIAGNOSIS — E119 Type 2 diabetes mellitus without complications: Secondary | ICD-10-CM

## 2021-05-30 DIAGNOSIS — Z6836 Body mass index (BMI) 36.0-36.9, adult: Secondary | ICD-10-CM | POA: Insufficient documentation

## 2021-05-30 DIAGNOSIS — N1831 Chronic kidney disease, stage 3a: Secondary | ICD-10-CM

## 2021-05-30 DIAGNOSIS — Z713 Dietary counseling and surveillance: Secondary | ICD-10-CM | POA: Insufficient documentation

## 2021-05-30 DIAGNOSIS — E1122 Type 2 diabetes mellitus with diabetic chronic kidney disease: Secondary | ICD-10-CM | POA: Insufficient documentation

## 2021-05-30 DIAGNOSIS — Z794 Long term (current) use of insulin: Secondary | ICD-10-CM | POA: Diagnosis not present

## 2021-05-30 DIAGNOSIS — E6609 Other obesity due to excess calories: Secondary | ICD-10-CM

## 2021-05-30 DIAGNOSIS — N1832 Chronic kidney disease, stage 3b: Secondary | ICD-10-CM | POA: Insufficient documentation

## 2021-05-30 NOTE — Progress Notes (Signed)
Pt states he likes sleeping, stating he sleeps for 2 days sometimes.  ?Pt states his depression is still a problem ?Pt states he has trouble with his memory most of the time.  ? ?Pt states he wants to lose 70 pounds in the next year and a half. ?Pt states the first time he lost 30 pounds when he was depressed stating he was not eating.  ?Pt states he does not like taking showers and does not care ?Urologist recommended he go outside for awhile to get out of the house, then go back inside and do some exercise ?Pt states he is doing PT currently.  ?Pt states he likes his Elenor Legato showing him Gatorade: currently 204 ?Pt states he has trouble gripping.  Dietitian advised to talk to his PT about grip strength ?Pt knows how to correct hypoglycemia and knows not to over correct too much, stating his back sweats when he is having a low ?Pt states he has given up soda unless he gets a pizza ?Pt states when he wants soda he puts it on ice cream ?Pt states he is working on a spread sheet for his credit cards ?Pt states he likes sweet tea.  Dietitian advised pt to brew his own tea in a large batch and add splenda to your cup. ?Pt states he cares about wt loss because he feels insecure  ?Pt states he drinks 4-5 gallons of 2% milk per week ?Patient states he thinks he should drink more water ? ?Goals: ?-Set alarm to take Lantus every morning at the same time each day ?-Take a shower every other day for personal hygiene ?-Stick with physical therapy ?-Talk to physical therapist about grip strength ?-Make a large pot of tea and add splenda to each serving/cup ?-Wake up at 10:00 am to go to the gym ? ? ?DM prescription: ?-trulicity ?-insulin novolog ?-invokana ?-insulin lantus  ?farxiga ? ?CGM: ?Libre ? ?Dx: ?Anxiety ?Clotting disorder ?Depression ?DM ?GERD ?HIV ?HTN ?CKD stage 3 ?ADHD ? ?Pt to return 17 June 2021 at 2:45pm ? ?Diabetes Self-Management Education ? ?Visit Type: First/Initial ? ? ?05/30/2021 ? ?[name], identified  by name and date of birth, is a 66 y.o. male with a diagnosis of Diabetes: Type 2.  ? ?ASSESSMENT ? ?Height 5' 10"  (1.778 m), weight 256 lb (116.1 kg). ?Body mass index is 36.73 kg/m?. ? ? Diabetes Self-Management Education - 05/30/21 1745   ? ?  ? Visit Information  ? Visit Type First/Initial   ?  ? Initial Visit  ? Diabetes Type Type 2   ? Are you currently following a meal plan? No   ? Are you taking your medications as prescribed? No   ?  ? Health Coping  ? How would you rate your overall health? Fair   ?  ? Psychosocial Assessment  ? Patient Belief/Attitude about Diabetes Defeat/Burnout   ? Self-care barriers Unsteady gait/risk for falls   ? Self-management support Doctor's office;Friends   ? Patient Concerns Weight Control   ? Special Needs Simplified materials;Verbal instruction   ? Preferred Learning Style Hands on   ? Learning Readiness Contemplating   ? How often do you need to have someone help you when you read instructions, pamphlets, or other written materials from your doctor or pharmacy? 1 - Never   ?  ? Pre-Education Assessment  ? Patient understands the diabetes disease and treatment process. Demonstrates understanding / competency   ? Patient understands incorporating nutritional management into lifestyle. Demonstrates  understanding / competency   ? Patient undertands incorporating physical activity into lifestyle. Demonstrates understanding / competency   ? Patient understands using medications safely. Demonstrates understanding / competency   ? Patient understands monitoring blood glucose, interpreting and using results Demonstrates understanding / competency   ? Patient understands prevention, detection, and treatment of acute complications. Demonstrates understanding / competency   ? Patient understands prevention, detection, and treatment of chronic complications. Demonstrates understanding / competency   ? Patient understands how to develop strategies to address psychosocial issues.  Demonstrates understanding / competency   ? Patient understands how to develop strategies to promote health/change behavior. Demonstrates understanding / competency   ?  ? Complications  ? Last HgB A1C per patient/outside source 7 %   ? How often do you check your blood sugar? 1-2 times/day   ? Fasting Blood glucose range (mg/dL) 130-179;180-200;>200   ? Postprandial Blood glucose range (mg/dL) 130-179;180-200;>200   ? Number of hypoglycemic episodes per month 0   ? Number of hyperglycemic episodes per week 1   ? Can you tell when your blood sugar is high? No   ? Have you had a dilated eye exam in the past 12 months? Yes   ? Have you had a dental exam in the past 12 months? Yes   ? Are you checking your feet? Yes   ? How many days per week are you checking your feet? 7   ?  ? Dietary Intake  ? Lunch sandwich   ? Beverage(s) milk, gatorade, juice, sweet tea   ?  ? Exercise  ? Exercise Type ADL's   ? How many days per week to you exercise? 0   ? How many minutes per day do you exercise? 0   ? Total minutes per week of exercise 0   ?  ? Patient Education  ? Previous Diabetes Education Yes (please comment)   ? Disease state  Explored patient's options for treatment of their diabetes   ? Nutrition management  Role of diet in the treatment of diabetes and the relationship between the three main macronutrients and blood glucose level;Reviewed blood glucose goals for pre and post meals and how to evaluate the patients' food intake on their blood glucose level.   ? Physical activity and exercise  Role of exercise on diabetes management, blood pressure control and cardiac health.;Helped patient identify appropriate exercises in relation to his/her diabetes, diabetes complications and other health issue.   ? Medications Taught/reviewed insulin injection, site rotation, insulin storage and needle disposal.;Reviewed patients medication for diabetes, action, purpose, timing of dose and side effects.;Reviewed medication adjustment  guidelines for hyperglycemia and sick days.   ? Acute complications Discussed and identified patients' treatment of hyperglycemia.   ? Chronic complications Dental care   ? Psychosocial adjustment Role of stress on diabetes;Worked with patient to identify barriers to care and solutions   ?  ? Individualized Goals (developed by patient)  ? Nutrition General guidelines for healthy choices and portions discussed   ? Physical Activity Exercise 1-2 times per week;15 minutes per day   ? Medications take my medication as prescribed   ? Monitoring  test my blood glucose as discussed;test blood glucose pre and post meals as discussed   ?  ? Post-Education Assessment  ? Patient understands the diabetes disease and treatment process. Demonstrates understanding / competency   ? Patient understands incorporating nutritional management into lifestyle. Demonstrates understanding / competency   ? Patient undertands incorporating physical activity  into lifestyle. Demonstrates understanding / competency   ? Patient understands using medications safely. Demonstrates understanding / competency   ? Patient understands monitoring blood glucose, interpreting and using results Demonstrates understanding / competency   ? Patient understands prevention, detection, and treatment of acute complications. Demonstrates understanding / competency   ? Patient understands prevention, detection, and treatment of chronic complications. Demonstrates understanding / competency   ? Patient understands how to develop strategies to address psychosocial issues. Demonstrates understanding / competency   ? Patient understands how to develop strategies to promote health/change behavior. Demonstrates understanding / competency   ?  ? Outcomes  ? Expected Outcomes Demonstrated interest in learning. Expect positive outcomes   ? Future DMSE 4-6 wks   ? Program Status Completed   ? ?  ?  ? ?  ? ? ?Individualized Plan for Diabetes Self-Management Training:   ? ?Learning Objective:  Patient will have a greater understanding of diabetes self-management. ?Patient education plan is to attend individual and/or group sessions per assessed needs and concerns. ? ? ?Expected Ou

## 2021-06-05 ENCOUNTER — Ambulatory Visit: Payer: PPO | Admitting: Physical Therapy

## 2021-06-07 DIAGNOSIS — G4733 Obstructive sleep apnea (adult) (pediatric): Secondary | ICD-10-CM | POA: Diagnosis not present

## 2021-06-12 ENCOUNTER — Encounter: Payer: Self-pay | Admitting: Physical Therapy

## 2021-06-12 ENCOUNTER — Ambulatory Visit: Payer: PPO | Admitting: Physical Therapy

## 2021-06-12 DIAGNOSIS — M6281 Muscle weakness (generalized): Secondary | ICD-10-CM

## 2021-06-12 DIAGNOSIS — R2689 Other abnormalities of gait and mobility: Secondary | ICD-10-CM | POA: Diagnosis not present

## 2021-06-12 DIAGNOSIS — R2681 Unsteadiness on feet: Secondary | ICD-10-CM

## 2021-06-12 NOTE — Therapy (Signed)
?OUTPATIENT PHYSICAL THERAPY TREATMENT NOTE ? ? ?Patient Name: Daniel [name] [facility] ?MRN: [id number] ?DOB:1955/06/03, 66 y.o., male ?Today's Date: 06/13/2021 ? ?PCP: [name], MD ?REFERRING PROVIDER: [name], MD ? ?END OF SESSION:  ? PT End of Session - 06/12/21 1456   ? ? Visit Number 2   ? Number of Visits 9   8+eval  ? Date for PT Re-Evaluation 06/25/21   ? Authorization Type [employer]   ? PT Start Time 1454   in bathroom prior to session  ? PT Stop Time 1530   ? PT Time Calculation (min) 36 min   ? Equipment Utilized During Treatment Gait belt   ? Activity Tolerance Patient tolerated treatment well   ? Behavior During Therapy WFL for tasks assessed/performed;Flat affect   ? ?  ?  ? ?  ? ? ?Past Medical History:  ?Diagnosis Date  ? ADHD (attention deficit hyperactivity disorder)   ? Anxiety   ? Chronic kidney disease   ? Clotting disorder ([place])   ? Depression   ? Diabetes mellitus without complication ([place])   ? Diabetes mellitus, type II ([place])   ? Dizziness 03/17/2015  ? Essential hypertension 06/25/2018  ? GERD (gastroesophageal reflux disease)   ? HIV disease ([place])   ? HIV infection ([place])   ? Liver disease   ? OSA (obstructive sleep apnea) 07/25/2015  ? Uses CPAP regularly  ? Peripheral vascular disease ([place])   ? Ulcer   ? ?Past Surgical History:  ?Procedure Laterality Date  ? AMPUTATION Right 10/02/2017  ? Procedure: RIGHT TRANSMETATARSAL AMPUTATION;  Surgeon: Leandrew Koyanagi, MD;  Location: [place];  Service: Orthopedics;  Laterality: Right;  ? SMALL INTESTINE SURGERY    ? STOMACH SURGERY    ? TOE AMPUTATION Right 08/2016  ? right great toe  ? ?Patient Active Problem List  ? Diagnosis Date Noted  ? Insomnia due to mental condition 05/23/2021  ? Atherosclerosis of native artery of extremity ([place]) 04/25/2021  ? Chronic nonalcoholic liver disease 93/90/3009  ? History of hepatitis B 04/25/2021  ? HIV positive ([place]) 04/25/2021  ? Long term (current) use of insulin ([place]) 04/25/2021  ? Long term  (current) use of anticoagulants 04/25/2021  ? Nonalcoholic steatohepatitis (NASH) 04/25/2021  ? Type 2 diabetes mellitus with other circulatory complications ([place]) 23/30/0762  ? Type 2 diabetes mellitus with peripheral angiopathy ([place]) 04/25/2021  ? Abnormal liver function tests 04/25/2021  ? Statin myopathy 02/12/2021  ? Unilateral primary osteoarthritis, right knee 12/15/2019  ? Generalized weakness 09/14/2019  ? Severe major depression ([place]) 11/17/2018  ? Class 2 obesity due to excess calories without serious comorbidity with body mass index (BMI) of 36.0 to 36.9 in adult 11/17/2018  ? Anticoagulated 08/24/2018  ? Dyslipidemia 07/27/2018  ? Essential hypertension 06/25/2018  ? Hypogonadism in male 05/10/2018  ? Numbness 03/31/2018  ? Diabetic peripheral neuropathy ([place]) 01/25/2018  ? S/P transmetatarsal amputation of foot, right ([place]) 10/09/2017  ? Hypothyroidism   ? Constipation   ? Obesity, Class III, BMI 40-49.9 (morbid obesity) ([place]) 01/15/2017  ? DOE (dyspnea on exertion) 01/13/2017  ? Imbalance 11/18/2016  ? Osteomyelitis of right foot ([place]) 09/22/2016  ? Chronic migraine 09/17/2016  ? Gait abnormality 09/17/2016  ? Fall 12/05/2015  ? Toe ulcer, right ([place]) 09/19/2015  ? Decreased pedal pulses 09/19/2015  ? OSA on CPAP 09/05/2015  ? Major depressive disorder, recurrent episode, moderate ([place]) 09/05/2015  ? Recurrent falls while walking 06/24/2015  ? H/O migraine  06/22/2015  ? Sinus headache 06/21/2015  ? OSA (obstructive sleep apnea) 06/20/2015  ? History of DVT (deep vein thrombosis) 06/20/2015  ? History of falling 06/20/2015  ? Chronic, continuous use of opioids 06/20/2015  ? Coagulopathy ([place]) 06/20/2015  ? Elevated CPK 06/20/2015  ? Pain syndrome, chronic 06/20/2015  ? Thrombocythemia 06/20/2015  ? Uncontrolled type 2 diabetes mellitus with hyperglycemia ([place]) 06/20/2015  ? Low back pain 06/11/2015  ? Unspecified abnormalities of gait and mobility 06/11/2015  ? Benign paroxysmal positional vertigo of  right ear 05/01/2015  ? Chronic kidney disease, stage III (moderate) ([place]) 08/09/2014  ? Renal insufficiency 08/09/2014  ? Insulin-requiring or dependent type II diabetes mellitus ([place]) 11/07/2013  ? Diabetes mellitus ([place]) 11/07/2013  ? Steatosis of liver 09/09/2010  ? Human immunodeficiency virus (HIV) infection ([place]) 06/04/2006  ? Herpes zoster 06/04/2006  ? Depression 06/04/2006  ? THROMBOPHLEBITIS NOS 06/04/2006  ? Gastroesophageal reflux disease 06/04/2006  ? ARTHRITIS, HAND 06/04/2006  ? Attention deficit hyperactivity disorder 06/04/2006  ? Abnormal blood chemistry level 06/04/2006  ? ? ?REFERRING DIAG:  G47.33,Z99.89 (ICD-10-CM) - OSA on CPAP F51.05 (ICD-10-CM) - Insomnia due to mental condition R26.9 (ICD-10-CM) - Unspecified abnormalities of gait and mobility   ? ?THERAPY DIAG:  ?Other abnormalities of gait and mobility ? ?Muscle weakness (generalized) ? ?Unsteadiness on feet ? ?PERTINENT HISTORY: R transmetatarsal amputation 10/02/2017, R great toe amputation 08/2016, ADHD, Anxiety, CKD, Clotting disorder, Depression, DM2, Dizziness, HTN, GERD, HIV, Liver disease, Obstructive sleep apnea, PVD  ? ?PRECAUTIONS: Fall;  Pt states he is color blind and wears reading glasses.  He also wears hearing aids bilaterally.  Both hearing aids are broken currently.  ? ?SUBJECTIVE: No new complaints. Unable to recall if he has has any falls since last session.  ? ?PAIN:  ?Are you having pain? Yes ?NPRS scale: 3-4/10 ?Pain location: right foot ?Pain orientation: Right  ?PAIN TYPE: chronic ?Pain description: intermittent and throbbing at night   ?Aggravating factors: Neuropathy pain, mostly at night. Tight shoes aggravates it ?Relieving factors: removing shoes, pain meds ? ?TODAY'S TREATMENT: ?STRENGTHENING/NMR: Session focused on establishment of an HEP for strengthening and balance. Min guard assist for safety with standing ex's. Cues on correct form and technique. No issues noted or reported with performance in session.  Refer to [place] program for full details.  ?  ?  ?PATIENT EDUCATION: ?Education details: HEP for strengthening and balance. ?Person educated: Patient ?Education method: Explanation ?Education comprehension: verbalized understanding and needs further education ?  ?  ?HOME EXERCISE PROGRAM: ?Access Code: [email or web address] ?URL: [email or web address] ?Date: 06/12/2021 ?Prepared by: Willow Ora ? ?Exercises ?- Sit to Stand with Arms Crossed  - 1 x daily - 5 x weekly - 1 sets - 5-10 reps ?- Heel Toe Raises with Counter Support  - 1 x daily - 5 x weekly - 1 sets - 10 reps ?- Standing March with Counter Support  - 1 x daily - 5 x weekly - 1 sets - 10 reps - 2-3 seconds hold ?- Feet Together Balance at [employer] Closed  - 1 x daily - 5 x weekly - 1 sets - 3 reps - 30 seconds hold ?- Standing Tandem Balance with Counter Support  - 1 x daily - 5 x weekly - 1 sets - 2-3 reps - 30 hold ?  ?  ?  ?GOALS: ?Goals reviewed with patient? Yes ?  ?SHORT TERM GOALS: ALL LTGs=STGs ?  ?  ?LONG TERM GOALS: Target date:  06/25/2021 ?  ?  Pt will be independent with strength and balance HEP with supervision from family. ?Baseline: To be establishing ?Goal status: INITIAL ?  ?2.  Pt will decrease 5xSTS to <13 seconds in order to demonstrate decreased risk for falls and improved functional bilateral LE strength and power. ?Baseline: 15.58sec ?Goal status: INITIAL ?  ?3.  Pt will improve FGA score to 22/30 in order to demonstrate improved balance and decreased fall risk. ?Baseline: 16/30 ?Goal status: INITIAL ?  ?4.  Pt will ambulate 500' w/o AD supervision level with no overt LOB or toe catching on various surfaces. ?Baseline: Bilateral toe catching x3 230', tends to slow pace intermittently. ?Goal status: INITIAL ?5. Pt will negotiate 15 6" stairs using no hand rails w/ reciprocal gait and no LOB. ?Baseline: bilateral rails reciprocal gait x4 steps ?           Goal status:  INITIAL ?  ?ASSESSMENT: ?  ?CLINICAL IMPRESSION: ?Skilled  session focused on establishment of an HEP to address strengthening and balance. No issues noted or reported with performance in session. The pt is progressing and should benefit from continued PT to progre

## 2021-06-17 ENCOUNTER — Ambulatory Visit: Payer: PPO | Admitting: Skilled Nursing Facility1

## 2021-06-17 ENCOUNTER — Other Ambulatory Visit: Payer: Self-pay | Admitting: Family Medicine

## 2021-06-17 DIAGNOSIS — G8929 Other chronic pain: Secondary | ICD-10-CM

## 2021-06-17 DIAGNOSIS — K219 Gastro-esophageal reflux disease without esophagitis: Secondary | ICD-10-CM

## 2021-06-18 ENCOUNTER — Ambulatory Visit: Payer: PPO | Admitting: Endocrinology

## 2021-06-18 NOTE — Telephone Encounter (Signed)
Patient is requesting a refill of the following medications: ?Requested Prescriptions  ? ?Pending Prescriptions Disp Refills  ? pantoprazole (PROTONIX) 40 MG tablet [Pharmacy Med Name: PANTOPRAZOLE 40MG TABLETS] 90 tablet 1  ?  Sig: TAKE 1 TABLET(40 MG) BY MOUTH DAILY  ? diclofenac Sodium (VOLTAREN) 1 % GEL [Pharmacy Med Name: DICLOFENAC 1% GEL 100GM (RX)] 300 g 0  ?  Sig: APPLY 2 GM TO EACH KNEE EVERY MORNING AND EVERY NIGHT AT BEDTIME, AND APPLY 1 GRAM TO EACH KNEE EVERY AFTERNOON  ? ? ?Date of patient request: 06/18/21 ?Last office visit: 04/10/21 ?Date of last refill: 05/21/21 ?Last refill amount: 300g ? ?

## 2021-06-19 ENCOUNTER — Ambulatory Visit: Payer: PPO | Admitting: Physical Therapy

## 2021-06-19 ENCOUNTER — Encounter: Payer: Self-pay | Admitting: Physical Therapy

## 2021-06-19 DIAGNOSIS — R2689 Other abnormalities of gait and mobility: Secondary | ICD-10-CM

## 2021-06-19 DIAGNOSIS — R2681 Unsteadiness on feet: Secondary | ICD-10-CM

## 2021-06-19 DIAGNOSIS — G8929 Other chronic pain: Secondary | ICD-10-CM

## 2021-06-19 DIAGNOSIS — M6281 Muscle weakness (generalized): Secondary | ICD-10-CM

## 2021-06-19 NOTE — Therapy (Signed)
?OUTPATIENT PHYSICAL THERAPY TREATMENT NOTE ? ? ?Patient Name: Daniel [name] [facility] ?MRN: [id number] ?DOB:Dec 05, 1955, 66 y.o., male ?Today's Date: 06/19/2021 ? ?PCP: [name], MD ?REFERRING PROVIDER: [name], MD ? ?END OF SESSION:  ? PT End of Session - 06/19/21 1457   ? ? Visit Number 3   ? Number of Visits 9   8+eval  ? Date for PT Re-Evaluation 06/25/21   ? Authorization Type [employer] PPO   ? PT Start Time 1455   ? PT Stop Time 1529   ? PT Time Calculation (min) 34 min   ? Equipment Utilized During Treatment Gait belt   ? Activity Tolerance Patient tolerated treatment well   ? Behavior During Therapy Flat affect;Agitated   ? ?  ?  ? ?  ? ? ?Past Medical History:  ?Diagnosis Date  ? ADHD (attention deficit hyperactivity disorder)   ? Anxiety   ? Chronic kidney disease   ? Clotting disorder ([place])   ? Depression   ? Diabetes mellitus without complication ([place])   ? Diabetes mellitus, type II ([place])   ? Dizziness 03/17/2015  ? Essential hypertension 06/25/2018  ? GERD (gastroesophageal reflux disease)   ? HIV disease ([place])   ? HIV infection ([place])   ? Liver disease   ? OSA (obstructive sleep apnea) 07/25/2015  ? Uses CPAP regularly  ? Peripheral vascular disease ([place])   ? Ulcer   ? ?Past Surgical History:  ?Procedure Laterality Date  ? AMPUTATION Right 10/02/2017  ? Procedure: RIGHT TRANSMETATARSAL AMPUTATION;  Surgeon: Leandrew Koyanagi, MD;  Location: [place];  Service: Orthopedics;  Laterality: Right;  ? SMALL INTESTINE SURGERY    ? STOMACH SURGERY    ? TOE AMPUTATION Right 08/2016  ? right great toe  ? ?Patient Active Problem List  ? Diagnosis Date Noted  ? Insomnia due to mental condition 05/23/2021  ? Atherosclerosis of native artery of extremity ([place]) 04/25/2021  ? Chronic nonalcoholic liver disease 92/44/6286  ? History of hepatitis B 04/25/2021  ? HIV positive ([place]) 04/25/2021  ? Long term (current) use of insulin ([place]) 04/25/2021  ? Long term (current) use of anticoagulants 04/25/2021  ?  Nonalcoholic steatohepatitis (NASH) 04/25/2021  ? Type 2 diabetes mellitus with other circulatory complications ([place]) 38/17/7116  ? Type 2 diabetes mellitus with peripheral angiopathy ([place]) 04/25/2021  ? Abnormal liver function tests 04/25/2021  ? Statin myopathy 02/12/2021  ? Unilateral primary osteoarthritis, right knee 12/15/2019  ? Generalized weakness 09/14/2019  ? Severe major depression ([place]) 11/17/2018  ? Class 2 obesity due to excess calories without serious comorbidity with body mass index (BMI) of 36.0 to 36.9 in adult 11/17/2018  ? Anticoagulated 08/24/2018  ? Dyslipidemia 07/27/2018  ? Essential hypertension 06/25/2018  ? Hypogonadism in male 05/10/2018  ? Numbness 03/31/2018  ? Diabetic peripheral neuropathy ([place]) 01/25/2018  ? S/P transmetatarsal amputation of foot, right ([place]) 10/09/2017  ? Hypothyroidism   ? Constipation   ? Obesity, Class III, BMI 40-49.9 (morbid obesity) ([place]) 01/15/2017  ? DOE (dyspnea on exertion) 01/13/2017  ? Imbalance 11/18/2016  ? Osteomyelitis of right foot ([place]) 09/22/2016  ? Chronic migraine 09/17/2016  ? Gait abnormality 09/17/2016  ? Fall 12/05/2015  ? Toe ulcer, right ([place]) 09/19/2015  ? Decreased pedal pulses 09/19/2015  ? OSA on CPAP 09/05/2015  ? Major depressive disorder, recurrent episode, moderate ([place]) 09/05/2015  ? Recurrent falls while walking 06/24/2015  ? H/O migraine 06/22/2015  ? Sinus headache 06/21/2015  ? OSA (  obstructive sleep apnea) 06/20/2015  ? History of DVT (deep vein thrombosis) 06/20/2015  ? History of falling 06/20/2015  ? Chronic, continuous use of opioids 06/20/2015  ? Coagulopathy ([place]) 06/20/2015  ? Elevated CPK 06/20/2015  ? Pain syndrome, chronic 06/20/2015  ? Thrombocythemia 06/20/2015  ? Uncontrolled type 2 diabetes mellitus with hyperglycemia ([place]) 06/20/2015  ? Low back pain 06/11/2015  ? Unspecified abnormalities of gait and mobility 06/11/2015  ? Benign paroxysmal positional vertigo of right ear 05/01/2015  ? Chronic kidney disease,  stage III (moderate) ([place]) 08/09/2014  ? Renal insufficiency 08/09/2014  ? Insulin-requiring or dependent type II diabetes mellitus ([place]) 11/07/2013  ? Diabetes mellitus ([place]) 11/07/2013  ? Steatosis of liver 09/09/2010  ? Human immunodeficiency virus (HIV) infection ([place]) 06/04/2006  ? Herpes zoster 06/04/2006  ? Depression 06/04/2006  ? THROMBOPHLEBITIS NOS 06/04/2006  ? Gastroesophageal reflux disease 06/04/2006  ? ARTHRITIS, HAND 06/04/2006  ? Attention deficit hyperactivity disorder 06/04/2006  ? Abnormal blood chemistry level 06/04/2006  ? ? ?REFERRING DIAG:  G47.33,Z99.89 (ICD-10-CM) - OSA on CPAP F51.05 (ICD-10-CM) - Insomnia due to mental condition R26.9 (ICD-10-CM) - Unspecified abnormalities of gait and mobility   ? ?THERAPY DIAG:  ?Other abnormalities of gait and mobility ? ?Muscle weakness (generalized) ? ?Unsteadiness on feet ? ?Chronic bilateral low back pain, unspecified whether sciatica present ? ?PERTINENT HISTORY: R transmetatarsal amputation 10/02/2017, R great toe amputation 08/2016, ADHD, Anxiety, CKD, Clotting disorder, Depression, DM2, Dizziness, HTN, GERD, HIV, Liver disease, Obstructive sleep apnea, PVD  ? ?PRECAUTIONS: Fall;  Pt states he is color blind and wears reading glasses.  He also wears hearing aids bilaterally.  Both hearing aids are broken currently.  ? ?SUBJECTIVE: Pt has not been doing HEP.  He has slept a lot.  Feels like this is a chore, compares it to going to doctor's office in that he just doesn't want to come.   ? ?PAIN:  ?Are you having pain? Yes ?NPRS scale: 2-3/10 ?Pain location: low back ?Pain orientation: Bilateral  ?PAIN TYPE: chronic ?Pain description: intermittent and sharp  ?Aggravating factors: walking ?Relieving factors: laying ? ?TODAY'S TREATMENT: ?STRENGTHENING/NMR:  ?Standing anti-rotation step outs x8 each direction ([place]) -LOB posteriorly x1 CGA to recover ?Standing anti-rotation pallof press x15 each direction ([place]) ?6" alt stair step  ups using BUE support>RUE support on rail 2x20 each LE; brief dizziness following last set requiring standing rest x30 seconds ?Standing at counter using unilateral UE support forward stepping over 4" hurdles 4x8'>side stepping over 4" hurdles using BUE support on counter 4x8' ?Pt dizzy following hurdles requiring extended rest and water.  Pt reports he has chronic vertigo whom he sees a specialist MD for.  He sees a ear, nose and throat doctor for it. ?BP assessed sitting EOM on LUE:  127/79, HR 87bpm; pt states he never checks it at home, but has cuff at home. ? ? ?  ?  ?PATIENT EDUCATION: ?Education details: HEP for strengthening and balance. ?Person educated: Patient ?Education method: Explanation ?Education comprehension: verbalized understanding and needs further education ?  ?  ?HOME EXERCISE PROGRAM: ?Access Code: [email or web address] ?URL: [email or web address] ?Date: 06/12/2021 ?Prepared by: Willow Ora ? ?Exercises ?- Sit to Stand with Arms Crossed  - 1 x daily - 5 x weekly - 1 sets - 5-10 reps ?- Heel Toe Raises with Counter Support  - 1 x daily - 5 x weekly - 1 sets - 10 reps ?- Standing March with Counter Support  - 1 x daily - 5  x weekly - 1 sets - 10 reps - 2-3 seconds hold ?- Feet Together Balance at [employer] Closed  - 1 x daily - 5 x weekly - 1 sets - 3 reps - 30 seconds hold ?- Standing Tandem Balance with Counter Support  - 1 x daily - 5 x weekly - 1 sets - 2-3 reps - 30 hold ? - Standing Anti-Rotation Press with Anchored Resistance  - 1 x daily - 5 x weekly - 2 sets - 10 reps ?- Anti-Rotation Sidestepping with Resistance  - 1 x daily - 5 x weekly - 2 sets - 10 reps ?  ?  ?GOALS: ?Goals reviewed with patient? Yes ?  ?SHORT TERM GOALS: ALL LTGs=STGs ?  ?  ?LONG TERM GOALS: Target date:  06/25/2021 ?  ?Pt will be independent with strength and balance HEP with supervision from family. ?Baseline: To be establishing ?Goal status: INITIAL ?  ?2.  Pt will decrease 5xSTS to <13 seconds in order to  demonstrate decreased risk for falls and improved functional bilateral LE strength and power. ?Baseline: 15.58sec ?Goal status: INITIAL ?  ?3.  Pt will improve FGA score to 22/30 in order to demonstrate improve

## 2021-06-19 NOTE — Patient Instructions (Signed)
Access Code: [email or web address] ?URL: [email or web address] ?Date: 06/12/2021 ? ? ?Exercises ?- Sit to Stand with Arms Crossed  - 1 x daily - 5 x weekly - 1 sets - 5-10 reps ?- Heel Toe Raises with Counter Support  - 1 x daily - 5 x weekly - 1 sets - 10 reps ?- Standing March with Counter Support  - 1 x daily - 5 x weekly - 1 sets - 10 reps - 2-3 seconds hold ?- Feet Together Balance at [employer] Closed  - 1 x daily - 5 x weekly - 1 sets - 3 reps - 30 seconds hold ?- Standing Tandem Balance with Counter Support  - 1 x daily - 5 x weekly - 1 sets - 2-3 reps - 30 hold ? - Standing Anti-Rotation Press with Anchored Resistance  - 1 x daily - 5 x weekly - 2 sets - 10 reps ?- Anti-Rotation Sidestepping with Resistance  - 1 x daily - 5 x weekly - 2 sets - 10 reps ?

## 2021-06-26 ENCOUNTER — Ambulatory Visit: Payer: PPO | Attending: Family Medicine | Admitting: Physical Therapy

## 2021-06-26 ENCOUNTER — Encounter: Payer: Self-pay | Admitting: Physical Therapy

## 2021-06-26 VITALS — BP 120/80 | HR 82

## 2021-06-26 DIAGNOSIS — R2689 Other abnormalities of gait and mobility: Secondary | ICD-10-CM | POA: Insufficient documentation

## 2021-06-26 DIAGNOSIS — G8929 Other chronic pain: Secondary | ICD-10-CM | POA: Diagnosis not present

## 2021-06-26 DIAGNOSIS — M6281 Muscle weakness (generalized): Secondary | ICD-10-CM | POA: Diagnosis not present

## 2021-06-26 DIAGNOSIS — M545 Low back pain, unspecified: Secondary | ICD-10-CM | POA: Diagnosis not present

## 2021-06-26 DIAGNOSIS — R2681 Unsteadiness on feet: Secondary | ICD-10-CM | POA: Insufficient documentation

## 2021-06-26 NOTE — Therapy (Signed)
?OUTPATIENT PHYSICAL THERAPY TREATMENT NOTE ? ? ?Patient Name: [name] ?MRN: [id number] ?DOB:05-12-1955, 66 y.o., male ?Today's Date: 06/26/2021 ? ?PCP: [name], MD ?REFERRING PROVIDER: [name], MD ? ?END OF SESSION:  ? PT End of Session - 06/26/21 1452   ? ? Visit Number 4   ? Number of Visits 9   8+eval  ? Date for PT Re-Evaluation 07/26/21   ? Authorization Type [employer]   ? PT Start Time 6237   ? PT Stop Time 1524   ? PT Time Calculation (min) 38 min   ? Equipment Utilized During Treatment Gait belt   ? Activity Tolerance Patient tolerated treatment well   ? Behavior During Therapy Flat affect;Agitated   ? ?  ?  ? ?  ? ? ?Past Medical History:  ?Diagnosis Date  ? ADHD (attention deficit hyperactivity disorder)   ? Anxiety   ? Chronic kidney disease   ? Clotting disorder ([place])   ? Depression   ? Diabetes mellitus without complication ([place])   ? Diabetes mellitus, type II ([place])   ? Dizziness 03/17/2015  ? Essential hypertension 06/25/2018  ? GERD (gastroesophageal reflux disease)   ? HIV disease ([place])   ? HIV infection ([place])   ? Liver disease   ? OSA (obstructive sleep apnea) 07/25/2015  ? Uses CPAP regularly  ? Peripheral vascular disease ([place])   ? Ulcer   ? ?Past Surgical History:  ?Procedure Laterality Date  ? AMPUTATION Right 10/02/2017  ? Procedure: RIGHT TRANSMETATARSAL AMPUTATION;  Surgeon: Leandrew Koyanagi, MD;  Location: [facility];  Service: Orthopedics;  Laterality: Right;  ? SMALL INTESTINE SURGERY    ? STOMACH SURGERY    ? TOE AMPUTATION Right 08/2016  ? right great toe  ? ?Patient Active Problem List  ? Diagnosis Date Noted  ? Insomnia due to mental condition 05/23/2021  ? Atherosclerosis of native artery of extremity ([place]) 04/25/2021  ? Chronic nonalcoholic liver disease 62/83/1517  ? History of hepatitis B 04/25/2021  ? HIV positive ([place]) 04/25/2021  ? Long term (current) use of insulin ([place]) 04/25/2021  ? Long term (current) use of anticoagulants 04/25/2021  ? Nonalcoholic  steatohepatitis (NASH) 04/25/2021  ? Type 2 diabetes mellitus with other circulatory complications ([place]) 61/60/7371  ? Type 2 diabetes mellitus with peripheral angiopathy ([place]) 04/25/2021  ? Abnormal liver function tests 04/25/2021  ? Statin myopathy 02/12/2021  ? Unilateral primary osteoarthritis, right knee 12/15/2019  ? Generalized weakness 09/14/2019  ? Severe major depression ([place]) 11/17/2018  ? Class 2 obesity due to excess calories without serious comorbidity with body mass index (BMI) of 36.0 to 36.9 in adult 11/17/2018  ? Anticoagulated 08/24/2018  ? Dyslipidemia 07/27/2018  ? Essential hypertension 06/25/2018  ? Hypogonadism in male 05/10/2018  ? Numbness 03/31/2018  ? Diabetic peripheral neuropathy ([place]) 01/25/2018  ? S/P transmetatarsal amputation of foot, right ([place]) 10/09/2017  ? Hypothyroidism   ? Constipation   ? Obesity, Class III, BMI 40-49.9 (morbid obesity) ([place]) 01/15/2017  ? DOE (dyspnea on exertion) 01/13/2017  ? Imbalance 11/18/2016  ? Osteomyelitis of right foot ([place]) 09/22/2016  ? Chronic migraine 09/17/2016  ? Gait abnormality 09/17/2016  ? Fall 12/05/2015  ? Toe ulcer, right ([place]) 09/19/2015  ? Decreased pedal pulses 09/19/2015  ? OSA on CPAP 09/05/2015  ? Major depressive disorder, recurrent episode, moderate ([place]) 09/05/2015  ? Recurrent falls while walking 06/24/2015  ? H/O migraine 06/22/2015  ? Sinus headache 06/21/2015  ? OSA (  obstructive sleep apnea) 06/20/2015  ? History of DVT (deep vein thrombosis) 06/20/2015  ? History of falling 06/20/2015  ? Chronic, continuous use of opioids 06/20/2015  ? Coagulopathy ([place]) 06/20/2015  ? Elevated CPK 06/20/2015  ? Pain syndrome, chronic 06/20/2015  ? Thrombocythemia 06/20/2015  ? Uncontrolled type 2 diabetes mellitus with hyperglycemia ([place]) 06/20/2015  ? Low back pain 06/11/2015  ? Unspecified abnormalities of gait and mobility 06/11/2015  ? Benign paroxysmal positional vertigo of right ear 05/01/2015  ? Chronic kidney disease, stage III  (moderate) ([place]) 08/09/2014  ? Renal insufficiency 08/09/2014  ? Insulin-requiring or dependent type II diabetes mellitus ([place]) 11/07/2013  ? Diabetes mellitus ([place]) 11/07/2013  ? Steatosis of liver 09/09/2010  ? Human immunodeficiency virus (HIV) infection ([place]) 06/04/2006  ? Herpes zoster 06/04/2006  ? Depression 06/04/2006  ? THROMBOPHLEBITIS NOS 06/04/2006  ? Gastroesophageal reflux disease 06/04/2006  ? ARTHRITIS, HAND 06/04/2006  ? Attention deficit hyperactivity disorder 06/04/2006  ? Abnormal blood chemistry level 06/04/2006  ? ? ?REFERRING DIAG:  G47.33,Z99.89 (ICD-10-CM) - OSA on CPAP F51.05 (ICD-10-CM) - Insomnia due to mental condition R26.9 (ICD-10-CM) - Unspecified abnormalities of gait and mobility   ? ?THERAPY DIAG:  ?Other abnormalities of gait and mobility ? ?Muscle weakness (generalized) ? ?Unsteadiness on feet ? ?Chronic bilateral low back pain, unspecified whether sciatica present ? ?PERTINENT HISTORY: R transmetatarsal amputation 10/02/2017, R great toe amputation 08/2016, ADHD, Anxiety, CKD, Clotting disorder, Depression, DM2, Dizziness, HTN, GERD, HIV, Liver disease, Obstructive sleep apnea, PVD  ? ?PRECAUTIONS: Fall;  Pt states he is color blind and wears reading glasses.  He also wears hearing aids bilaterally.  Both hearing aids are broken currently.  ? ?Pt ambulates into clinic independently 100' to mat drinking sweet tea from [employer]. ? ?SUBJECTIVE: Therapist inquires about checking pt's blood sugar using Freestyle Libre 2 device.  Pt states he does not have the monitor with him and does not usually carry it with him.  His blood sugar was 163 at 11am.  Pt is agitated and states he has been told his blood sugar is uncontrolled and that he has never been told not to exercise if it is over 300.  "I'll never go to the gym if it can't be over 300."  Pt states he has "done the thing at the countertop with my feet, but nothing else."  ? ?PAIN:  ?Are you having pain? No - pt states feet are  burning because he forgot to take his pregabalin ?NPRS scale: 0/10 ?Pain location: low back ?Pain orientation: Bilateral  ?PAIN TYPE: chronic ?Pain description: intermittent and sharp  ?Aggravating factors: walking ?Relieving factors: laying ? ?TODAY'S TREATMENT: ?Pt negotiates 4 bouts of 6" stairs; initial trial w/ bil rails using reciprocal gait ascending and descending>1 bout using R rail only reciprocal gait ascending and step-to w/ LLE leading descending>2 trials w/o rails using reciprocal gait ascending, requires CGA-minA w/ min cues for weight shifting varying from step-to the reciprocal descending. ?Pt performs 6" step ups x5 each LE using BUE support>L rail>no UE support ?Backwards stepping 2x25' mod cues for widened BOS and R step length, pt agitated w/ therapist when therapist notes LOB w/ static standing following task ?Pt ambulates up and down ramp 6x5' using CGA-SBA w/ min cues for widened BOS ? ? ?STRENGTHENING/NMR:  ?Standing anti-rotation step outs x8 each direction (Blue theraband) -LOB posteriorly x1 CGA to recover ?Standing anti-rotation pallof press x15 each direction (Blue theraband) ?6" alt stair step ups using BUE support>RUE support on  rail 2x20 each LE; brief dizziness following last set requiring standing rest x30 seconds-reports mild knee discomfort that resolves with rest ?Standing at counter using unilateral UE support forward stepping over 4" hurdles 4x8'>side stepping over 4" hurdles using BUE support on counter 4x8' ?Pt dizzy following hurdles requiring extended rest and water.  Pt reports he has chronic vertigo whom he sees a specialist MD for.  He sees a ear, nose and throat doctor for it. ?BP assessed sitting EOM on LUE:  127/79, HR 87bpm; pt states he never checks it at home, but has cuff at home. ?On blue mat pt ambulates forwards and backwards 10x8' and side steps 6x8' bil directions requiring SBA-CGA and cues to maintain forward trunk to prevent backwards shuffling with  cessation of activity. ?Pt ambulates w/ purple ball toss x115' requiring SBA-CGA,  cues to increase challenge w/ height of toss ? ?  ?  ?PATIENT EDUCATION: ?Education details: Edu on compliance to HEP, meds, and

## 2021-06-28 DIAGNOSIS — M5135 Other intervertebral disc degeneration, thoracolumbar region: Secondary | ICD-10-CM | POA: Diagnosis not present

## 2021-06-28 DIAGNOSIS — M5137 Other intervertebral disc degeneration, lumbosacral region: Secondary | ICD-10-CM | POA: Diagnosis not present

## 2021-06-28 DIAGNOSIS — M9903 Segmental and somatic dysfunction of lumbar region: Secondary | ICD-10-CM | POA: Diagnosis not present

## 2021-06-28 DIAGNOSIS — M9904 Segmental and somatic dysfunction of sacral region: Secondary | ICD-10-CM | POA: Diagnosis not present

## 2021-06-28 DIAGNOSIS — M5136 Other intervertebral disc degeneration, lumbar region: Secondary | ICD-10-CM | POA: Diagnosis not present

## 2021-06-28 DIAGNOSIS — M9902 Segmental and somatic dysfunction of thoracic region: Secondary | ICD-10-CM | POA: Diagnosis not present

## 2021-07-01 ENCOUNTER — Ambulatory Visit (INDEPENDENT_AMBULATORY_CARE_PROVIDER_SITE_OTHER): Payer: PPO | Admitting: Family Medicine

## 2021-07-01 VITALS — BP 136/74 | HR 90 | Temp 98.0°F | Resp 16 | Ht 70.0 in | Wt 252.2 lb

## 2021-07-01 DIAGNOSIS — Z794 Long term (current) use of insulin: Secondary | ICD-10-CM | POA: Diagnosis not present

## 2021-07-01 DIAGNOSIS — N183 Chronic kidney disease, stage 3 unspecified: Secondary | ICD-10-CM

## 2021-07-01 DIAGNOSIS — E1142 Type 2 diabetes mellitus with diabetic polyneuropathy: Secondary | ICD-10-CM

## 2021-07-01 DIAGNOSIS — R42 Dizziness and giddiness: Secondary | ICD-10-CM

## 2021-07-01 DIAGNOSIS — Z9181 History of falling: Secondary | ICD-10-CM | POA: Diagnosis not present

## 2021-07-01 DIAGNOSIS — E1122 Type 2 diabetes mellitus with diabetic chronic kidney disease: Secondary | ICD-10-CM

## 2021-07-01 NOTE — Progress Notes (Signed)
? ?Subjective:  ?Patient ID: Daniel [name], male    DOB: [birth year]  Age: 66 y.o. MRN: [id number] ? ?CC:  ?Chief Complaint  ?Patient presents with  ? Dizziness  ?  Pt here for general dizziness, notes has fallen twice over last month or so, notes couldn't get up while working in his yard. Denies broken, unknown if patient hit his head notes he cant remember   ? ? ?HPI ?Daniel [name] presents for  ? ?Dizziness with history of falls ?Last discussed in February.  Had a fall 6 days prior, tripped at that time on curb or something on the ground.  Second fall was 2 days prior to last visit where he was rushing quickly to get into the [facility] and felt like his legs are moving faster than his upper body. ?We have discussed dizziness and history of falls previously.  Thought to be a component of vertigo as well as side effects from his medications including his psychiatric medications which have been discussed with his psychiatrist, chronic low back pain and peripheral neuropathy treated with Lyrica.  We have referred him to outpatient rehab for generalized muscle weakness and unsteadiness.  3 visits, did not return for remainder of visits as he can make it.  He told me he did not have confidence in physical therapy and refused any further physical therapy at his February visit.  He has been seen by neurology previously, [name].  Has also been treated with tamsulosin, then still dosing 8 mg for bladder outlet obstruction symptoms, under care of urology.  At his February visit I recommended discussing his follows with neurology, we also discussed using a cane or walker and walking slower to lessen risk of tripping or falling.  Recommended occupational therapy to evaluate and help minimize risk of falling. ? ?Since last visit has been evaluated by PT - on 05/29/21. 3 PT treatments since. Every Wednesday - next appt 2 days.  ?Feels better about new therapist.  ?Not using cane, or walker.has canes available - walkers in storage  area. Hard to get to.  ? ?2 falls in past month - walking through the yard taking garbage out. Tripped on grass clippings. No injury, had to call neighbor. Other fall in bathroom - fell off toilet leaning forward. No head or other injury. Could not push self up. ?No focal weakness, just couldn't push self up. No head injury. No preceding lightheadedness before either fall Not feeling lightheaded recently.  ?No chest pains.  ?Hopeful for better walking as goal for PT.  ?Legs have moved too quick in past and unable to slow- had to crash into a post - see last visit.  ? ?Glucose 347 currently. Sometimes under 100.  ?Ate sweet tea and egg rolls prior to visit today. Has made some diet changes, but decreasing sweet tea has been the challenge.  ?Diabetes treated by endocrinology. No show on 4/25 appt, cancelled 5/10 appt?  ?Lab Results  ?Component Value Date  ? HGBA1C 7.0 (A) 04/11/2021  ? ?Wt Readings from Last 3 Encounters:  ?07/01/21 252 lb 3.2 oz (114.4 kg)  ?05/30/21 256 lb (116.1 kg)  ?05/23/21 254 lb (115.2 kg)  ? ?Taking pregabalin for neuropathy - 670m BID. Only received 90 on last fill - not sure why.  ? ? ?History ?Patient Active Problem List  ? Diagnosis Date Noted  ? Insomnia due to mental condition 05/23/2021  ? Atherosclerosis of native artery of extremity ([place] 04/25/2021  ? Chronic nonalcoholic liver  disease 04/25/2021  ? History of hepatitis B 04/25/2021  ? HIV positive ([place]) 04/25/2021  ? Long term (current) use of insulin ([place]) 04/25/2021  ? Long term (current) use of anticoagulants 04/25/2021  ? Nonalcoholic steatohepatitis (NASH) 04/25/2021  ? Type 2 diabetes mellitus with other circulatory complications ([place]) 18/84/1660  ? Type 2 diabetes mellitus with peripheral angiopathy ([place]) 04/25/2021  ? Abnormal liver function tests 04/25/2021  ? Statin myopathy 02/12/2021  ? Unilateral primary osteoarthritis, right knee 12/15/2019  ? Generalized weakness 09/14/2019  ? Severe major depression ([place])  11/17/2018  ? Class 2 obesity due to excess calories without serious comorbidity with body mass index (BMI) of 36.0 to 36.9 in adult 11/17/2018  ? Anticoagulated 08/24/2018  ? Dyslipidemia 07/27/2018  ? Essential hypertension 06/25/2018  ? Hypogonadism in male 05/10/2018  ? Numbness 03/31/2018  ? Diabetic peripheral neuropathy ([place]) 01/25/2018  ? S/P transmetatarsal amputation of foot, right ([place]) 10/09/2017  ? Hypothyroidism   ? Constipation   ? Obesity, Class III, BMI 40-49.9 (morbid obesity) ([place]) 01/15/2017  ? DOE (dyspnea on exertion) 01/13/2017  ? Imbalance 11/18/2016  ? Osteomyelitis of right foot ([place]) 09/22/2016  ? Chronic migraine 09/17/2016  ? Gait abnormality 09/17/2016  ? Fall 12/05/2015  ? Toe ulcer, right ([place]) 09/19/2015  ? Decreased pedal pulses 09/19/2015  ? OSA on CPAP 09/05/2015  ? Major depressive disorder, recurrent episode, moderate ([place]) 09/05/2015  ? Recurrent falls while walking 06/24/2015  ? H/O migraine 06/22/2015  ? Sinus headache 06/21/2015  ? OSA (obstructive sleep apnea) 06/20/2015  ? History of DVT (deep vein thrombosis) 06/20/2015  ? History of falling 06/20/2015  ? Chronic, continuous use of opioids 06/20/2015  ? Coagulopathy ([place]) 06/20/2015  ? Elevated CPK 06/20/2015  ? Pain syndrome, chronic 06/20/2015  ? Thrombocythemia 06/20/2015  ? Uncontrolled type 2 diabetes mellitus with hyperglycemia ([place]) 06/20/2015  ? Low back pain 06/11/2015  ? Unspecified abnormalities of gait and mobility 06/11/2015  ? Benign paroxysmal positional vertigo of right ear 05/01/2015  ? Chronic kidney disease, stage III (moderate) ([place]) 08/09/2014  ? Renal insufficiency 08/09/2014  ? Insulin-requiring or dependent type II diabetes mellitus ([place]) 11/07/2013  ? Diabetes mellitus ([place]) 11/07/2013  ? Steatosis of liver 09/09/2010  ? Human immunodeficiency virus (HIV) infection ([place]) 06/04/2006  ? Herpes zoster 06/04/2006  ? Depression 06/04/2006  ? THROMBOPHLEBITIS NOS 06/04/2006  ? Gastroesophageal reflux  disease 06/04/2006  ? ARTHRITIS, HAND 06/04/2006  ? Attention deficit hyperactivity disorder 06/04/2006  ? Abnormal blood chemistry level 06/04/2006  ? ?Past Medical History:  ?Diagnosis Date  ? ADHD (attention deficit hyperactivity disorder)   ? Anxiety   ? Chronic kidney disease   ? Clotting disorder ([place])   ? Depression   ? Diabetes mellitus without complication ([place])   ? Diabetes mellitus, type II ([place])   ? Dizziness 03/17/2015  ? Essential hypertension 06/25/2018  ? GERD (gastroesophageal reflux disease)   ? HIV disease ([place])   ? HIV infection ([place])   ? Liver disease   ? OSA (obstructive sleep apnea) 07/25/2015  ? Uses CPAP regularly  ? Peripheral vascular disease ([place])   ? Ulcer   ? ?Past Surgical History:  ?Procedure Laterality Date  ? AMPUTATION Right 10/02/2017  ? Procedure: RIGHT TRANSMETATARSAL AMPUTATION;  Surgeon: Leandrew Koyanagi, MD;  Location: [facility];  Service: Orthopedics;  Laterality: Right;  ? SMALL INTESTINE SURGERY    ? STOMACH SURGERY    ? TOE AMPUTATION Right 08/2016  ? right great toe  ? ?  Allergies  ?Allergen Reactions  ? Aspirin Swelling  ? Efavirenz Swelling and Rash  ?  Other reaction(s): anaphylaxis  ? Ibuprofen Swelling  ? Nsaids Other (See Comments)  ?  unknwn  ? Pravastatin   ?  myalgias  ? Lipitor [Atorvastatin Calcium] Other (See Comments)  ?  Leg pain  ? ?Prior to Admission medications   ?Medication Sig Start Date End Date Taking? Authorizing Provider  ?acetaminophen (TYLENOL) 500 MG tablet Take 1,500 mg by mouth in the morning and at bedtime.   Yes [provider]  ?ALPRAZolam Duanne Moron) 1 MG tablet Take 1 mg by mouth at bedtime. *May take one tablet up to 4 times daily as needed for anxiety   Yes [provider]  ?amphetamine-dextroamphetamine (ADDERALL) 30 MG tablet Take 30 mg by mouth 3 (three) times daily.   Yes [provider]  ?B-D ULTRAFINE III SHORT PEN 31G X 8 MM MISC USE AS DIRECTED THREE TIMES DAILY 10/09/20  Yes Renato Shin, MD  ?brexpiprazole (REXULTI) 1  MG TABS tablet Take 1 mg by mouth at bedtime.   Yes [provider]  ?Continuous Blood Gluc Receiver (FREESTYLE LIBRE 2 READER) DEVI Use as instructed to check blood sugars. 04/18/21  Yes Renato Shin, MD

## 2021-07-01 NOTE — Patient Instructions (Addendum)
I am glad you are in physical therapy.  This should help with strength and hopefully help lessen risk of falls.  I do recommend discussing a walker or rollator with your physical therapist and see if they feel that would be helpful.  If so, they can let me know and I will send in a prescription.  I can also refer you to occupational therapy if that is needed.  I do recommend using some assistive device whether it be a cane, walker or rollator to minimize risk of falls. Bring cane to next physical therapy appointment as well.  ?Follow up with endocrinology - the computer shows a cancelled appointment in 2 days. ? ?Fall Prevention in the Home, Adult ?Falls can cause injuries and affect people of all ages. There are many simple things that you can do to make your home safe and to help prevent falls. Ask for help when making these changes, if needed. ?What actions can I take to prevent falls? ?General instructions ?Use good lighting in all rooms. Replace any light bulbs that burn out, turn on lights if it is dark, and use night-lights. ?Place frequently used items in easy-to-reach places. Lower the shelves around your home if necessary. ?Set up furniture so that there are clear paths around it. Avoid moving your furniture around. ?Remove throw rugs and other tripping hazards from the floor. ?Avoid walking on wet floors. ?Fix any uneven floor surfaces. ?Add color or contrast paint or tape to grab bars and handrails in your home. Place contrasting color strips on the first and last steps of staircases. ?When you use a stepladder, make sure that it is completely opened and that the sides and supports are firmly locked. Have someone hold the ladder while you are using it. Do not climb a closed stepladder. ?Know where your pets are when moving through your home. ?What can I do in the bathroom? ? ?  ? ?Keep the floor dry. Immediately clean up any water that is on the floor. ?Remove soap buildup in the tub or shower  regularly. ?Use nonskid mats or decals on the floor of the tub or shower. ?Attach bath mats securely with double-sided, nonslip rug tape. ?If you need to sit down while you are in the shower, use a plastic, nonslip stool. ?Install grab bars by the toilet and in the tub and shower. Do not use towel bars as grab bars. ?What can I do in the bedroom? ?Make sure that a bedside light is easy to reach. ?Do not use oversized bedding that reaches the floor. ?Have a firm chair that has side arms to use for getting dressed. ?What can I do in the kitchen? ?Clean up any spills right away. ?If you need to reach for something above you, use a sturdy step stool that has a grab bar. ?Keep electrical cables out of the way. ?Do not use floor polish or wax that makes floors slippery. If you must use wax, make sure that it is non-skid floor wax. ?What can I do with my stairs? ?Do not leave any items on the stairs. ?Make sure that you have a light switch at the top and the bottom of the stairs. Have them installed if you do not have them. ?Make sure that there are handrails on both sides of the stairs. Fix handrails that are broken or loose. Make sure that handrails are as long as the staircases. ?Install non-slip stair treads on all stairs in your home. ?Avoid having throw rugs at  the top or bottom of stairs, or secure the rugs with carpet tape to prevent them from moving. ?Choose a carpet design that does not hide the edge of steps on the stairs. ?Check any carpeting to make sure that it is firmly attached to the stairs. Fix any carpet that is loose or worn. ?What can I do on the outside of my home? ?Use bright outdoor lighting. ?Regularly repair the edges of walkways and driveways and fix any cracks. ?Remove high doorway thresholds. ?Trim any shrubbery on the main path into your home. ?Regularly check that handrails are securely fastened and in good repair. Both sides of all steps should have handrails. ?Install guardrails along the  edges of any raised decks or porches. ?Clear walkways of debris and clutter, including tools and rocks. ?Have leaves, snow, and ice cleared regularly. ?Use sand or salt on walkways during winter months. ?In the garage, clean up any spills right away, including grease or oil spills. ?What other actions can I take? ?Wear closed-toe shoes that fit well and support your feet. Wear shoes that have rubber soles or low heels. ?Use mobility aids as needed, such as canes, walkers, scooters, and crutches. ?Review your medicines with your health care provider. Some medicines can cause dizziness or changes in blood pressure, which increase your risk of falling. ?Talk with your health care provider about other ways that you can decrease your risk of falls. This may include working with a physical therapist or trainer to improve your strength, balance, and endurance. ?Where to find more information ?Centers for Disease Control and Prevention, STEADI: http://www.wolf.info/ ?[employer] on Aging: http://kim-miller.com/ ?Contact a health care provider if: ?You are afraid of falling at home. ?You feel weak, drowsy, or dizzy at home. ?You fall at home. ?Summary ?There are many simple things that you can do to make your home safe and to help prevent falls. ?Ways to make your home safe include removing tripping hazards and installing grab bars in the bathroom. ?Ask for help when making these changes in your home. ?This information is not intended to replace advice given to you by your health care provider. Make sure you discuss any questions you have with your health care provider. ?Document Revised: 11/12/2020 Document Reviewed: 09/14/2019 ?Elsevier Patient Education ? [place]. ? ? ? ? ? ? ? ? ? ? ? ?

## 2021-07-02 ENCOUNTER — Encounter: Payer: Self-pay | Admitting: Family Medicine

## 2021-07-02 DIAGNOSIS — M5137 Other intervertebral disc degeneration, lumbosacral region: Secondary | ICD-10-CM | POA: Diagnosis not present

## 2021-07-02 DIAGNOSIS — M5136 Other intervertebral disc degeneration, lumbar region: Secondary | ICD-10-CM | POA: Diagnosis not present

## 2021-07-02 DIAGNOSIS — M5135 Other intervertebral disc degeneration, thoracolumbar region: Secondary | ICD-10-CM | POA: Diagnosis not present

## 2021-07-02 DIAGNOSIS — M9903 Segmental and somatic dysfunction of lumbar region: Secondary | ICD-10-CM | POA: Diagnosis not present

## 2021-07-02 DIAGNOSIS — M9904 Segmental and somatic dysfunction of sacral region: Secondary | ICD-10-CM | POA: Diagnosis not present

## 2021-07-02 DIAGNOSIS — M9902 Segmental and somatic dysfunction of thoracic region: Secondary | ICD-10-CM | POA: Diagnosis not present

## 2021-07-03 ENCOUNTER — Ambulatory Visit: Payer: PPO | Admitting: Endocrinology

## 2021-07-03 ENCOUNTER — Ambulatory Visit: Payer: PPO | Admitting: Physical Therapy

## 2021-07-07 DIAGNOSIS — G4733 Obstructive sleep apnea (adult) (pediatric): Secondary | ICD-10-CM | POA: Diagnosis not present

## 2021-07-08 ENCOUNTER — Other Ambulatory Visit: Payer: Self-pay | Admitting: Family Medicine

## 2021-07-08 DIAGNOSIS — B20 Human immunodeficiency virus [HIV] disease: Secondary | ICD-10-CM

## 2021-07-09 DIAGNOSIS — M5135 Other intervertebral disc degeneration, thoracolumbar region: Secondary | ICD-10-CM | POA: Diagnosis not present

## 2021-07-09 DIAGNOSIS — M9904 Segmental and somatic dysfunction of sacral region: Secondary | ICD-10-CM | POA: Diagnosis not present

## 2021-07-09 DIAGNOSIS — M5136 Other intervertebral disc degeneration, lumbar region: Secondary | ICD-10-CM | POA: Diagnosis not present

## 2021-07-09 DIAGNOSIS — M5137 Other intervertebral disc degeneration, lumbosacral region: Secondary | ICD-10-CM | POA: Diagnosis not present

## 2021-07-09 DIAGNOSIS — M9902 Segmental and somatic dysfunction of thoracic region: Secondary | ICD-10-CM | POA: Diagnosis not present

## 2021-07-09 DIAGNOSIS — M9903 Segmental and somatic dysfunction of lumbar region: Secondary | ICD-10-CM | POA: Diagnosis not present

## 2021-07-09 NOTE — Telephone Encounter (Signed)
Patient is requesting a refill of the following medications: ?Requested Prescriptions  ? ?Pending Prescriptions Disp Refills  ? ondansetron (ZOFRAN) 8 MG tablet [Pharmacy Med Name: ONDANSETRON 8MG TABLETS] 20 tablet 1  ?  Sig: TAKE 1 TABLET BY MOUTH EVERY 8 HOURS AS NEEDED FOR NAUSEA OR VOMITING  ? ? ?Date of patient request: 07/09/21 ?Last office visit: 07/01/21 ?Date of last refill: 01/22/21 ?Last refill amount: 20 ? ?

## 2021-07-10 ENCOUNTER — Telehealth: Payer: Self-pay

## 2021-07-10 ENCOUNTER — Ambulatory Visit: Payer: PPO | Admitting: Physical Therapy

## 2021-07-10 NOTE — Addendum Note (Signed)
Addended by: Elease Etienne B on: 07/10/2021 12:17 PM ? ? Modules accepted: Orders ? ?

## 2021-07-10 NOTE — Telephone Encounter (Signed)
Placed this form in you to be signed folder, you do not have to fill this out I can complete the form but I have provided this as reference for the questions as I am unsure about the patients use of the Ondansetron prescription. This form is asking about patient being in cancer treatments but I do not see information about this in the patients chart.  ? ?Please advise  ?

## 2021-07-10 NOTE — Telephone Encounter (Signed)
Zofran has been prescribed previously by neurology for nausea with prolonged severe headaches. ?

## 2021-07-10 NOTE — Telephone Encounter (Signed)
Please advise 

## 2021-07-10 NOTE — Telephone Encounter (Signed)
Received a call from patient's insurance, [employer]. They are needing more clinical information to approve the medication authorization for ZOFRAN 8 MG.  ?

## 2021-07-11 ENCOUNTER — Ambulatory Visit: Payer: PPO | Admitting: Physical Therapy

## 2021-07-11 ENCOUNTER — Encounter: Payer: Self-pay | Admitting: Physical Therapy

## 2021-07-11 DIAGNOSIS — M6281 Muscle weakness (generalized): Secondary | ICD-10-CM

## 2021-07-11 DIAGNOSIS — R2689 Other abnormalities of gait and mobility: Secondary | ICD-10-CM | POA: Diagnosis not present

## 2021-07-11 DIAGNOSIS — R2681 Unsteadiness on feet: Secondary | ICD-10-CM

## 2021-07-11 NOTE — Telephone Encounter (Signed)
Completed form and faxed to requested number

## 2021-07-11 NOTE — Therapy (Addendum)
OUTPATIENT PHYSICAL THERAPY TREATMENT NOTE   Patient Name: Daniel [name] MRN: [id number] DOB:05-21-1955, 66 y.o., male Today's Date: 07/12/2021  PCP: [name], MD REFERRING PROVIDER: [name], MD  END OF SESSION:   PT End of Session - 07/11/21 1235     Visit Number 5    Number of Visits 9   8+eval   Date for PT Re-Evaluation 07/26/21    Authorization Type [employer]    PT Start Time 1233    PT Stop Time 1314    PT Time Calculation (min) 41 min    Equipment Utilized During Treatment Gait belt    Activity Tolerance Patient tolerated treatment well    Behavior During Therapy Flat affect;WFL for tasks assessed/performed             Past Medical History:  Diagnosis Date   ADHD (attention deficit hyperactivity disorder)    Anxiety    Chronic kidney disease    Clotting disorder ([place])    Depression    Diabetes mellitus without complication ([place])    Diabetes mellitus, type II ([place])    Dizziness 03/17/2015   Essential hypertension 06/25/2018   GERD (gastroesophageal reflux disease)    HIV disease ([place])    HIV infection ([place])    Liver disease    OSA (obstructive sleep apnea) 07/25/2015   Uses CPAP regularly   Peripheral vascular disease ([place])    Ulcer    Past Surgical History:  Procedure Laterality Date   AMPUTATION Right 10/02/2017   Procedure: RIGHT TRANSMETATARSAL AMPUTATION;  Surgeon: Leandrew Koyanagi, MD;  Location: [facility];  Service: Orthopedics;  Laterality: Right;   SMALL INTESTINE SURGERY     STOMACH SURGERY     TOE AMPUTATION Right 08/2016   right great toe   Patient Active Problem List   Diagnosis Date Noted   Insomnia due to mental condition 05/23/2021   Atherosclerosis of native artery of extremity ([place]) 04/25/2021   Chronic nonalcoholic liver disease 69/62/9528   History of hepatitis B 04/25/2021   HIV positive ([place]) 04/25/2021   Long term (current) use of insulin ([place]) 04/25/2021   Long term (current) use of anticoagulants  41/32/4401   Nonalcoholic steatohepatitis ([place]) 04/25/2021   Type 2 diabetes mellitus with other circulatory complications ([place]) 02/72/5366   Type 2 diabetes mellitus with peripheral angiopathy ([place]) 04/25/2021   Abnormal liver function tests 04/25/2021   Statin myopathy 02/12/2021   Unilateral primary osteoarthritis, right knee 12/15/2019   Generalized weakness 09/14/2019   Severe major depression ([place]) 11/17/2018   Class 2 obesity due to excess calories without serious comorbidity with body mass index (BMI) of 36.0 to 36.9 in adult 11/17/2018   Anticoagulated 08/24/2018   Dyslipidemia 07/27/2018   Essential hypertension 06/25/2018   Hypogonadism in male 05/10/2018   Numbness 03/31/2018   Diabetic peripheral neuropathy ([place]) 01/25/2018   S/P transmetatarsal amputation of foot, right ([place]) 10/09/2017   Hypothyroidism    Constipation    Obesity, Class III, BMI 40-49.9 (morbid obesity) ([place]) 01/15/2017   DOE (dyspnea on exertion) 01/13/2017   Imbalance 11/18/2016   Osteomyelitis of right foot ([place]) 09/22/2016   Chronic migraine 09/17/2016   Gait abnormality 09/17/2016   Fall 12/05/2015   Toe ulcer, right ([place]) 09/19/2015   Decreased pedal pulses 09/19/2015   OSA on CPAP 09/05/2015   Major depressive disorder, recurrent episode, moderate ([place]) 09/05/2015   Recurrent falls while walking 06/24/2015   H/O migraine 06/22/2015   Sinus headache 06/21/2015  OSA (obstructive sleep apnea) 06/20/2015   History of DVT (deep vein thrombosis) 06/20/2015   History of falling 06/20/2015   Chronic, continuous use of opioids 06/20/2015   Coagulopathy ([place]) 06/20/2015   Elevated CPK 06/20/2015   Pain syndrome, chronic 06/20/2015   Thrombocythemia 06/20/2015   Uncontrolled type 2 diabetes mellitus with hyperglycemia ([place]) 06/20/2015   Low back pain 06/11/2015   Unspecified abnormalities of gait and mobility 06/11/2015   Benign paroxysmal positional vertigo of right ear 05/01/2015   Chronic  kidney disease, stage III (moderate) ([place]) 08/09/2014   Renal insufficiency 08/09/2014   Insulin-requiring or dependent type II diabetes mellitus ([place]) 11/07/2013   Diabetes mellitus ([place]) 11/07/2013   Steatosis of liver 09/09/2010   Human immunodeficiency virus (HIV) infection ([place]) 06/04/2006   Herpes zoster 06/04/2006   Depression 06/04/2006   THROMBOPHLEBITIS NOS 06/04/2006   Gastroesophageal reflux disease 06/04/2006   ARTHRITIS, HAND 06/04/2006   Attention deficit hyperactivity disorder 06/04/2006   Abnormal blood chemistry level 06/04/2006    REFERRING DIAG:  G47.33,Z99.89 (ICD-10-CM) - OSA on CPAP F51.05 (ICD-10-CM) - Insomnia due to mental condition R26.9 (ICD-10-CM) - Unspecified abnormalities of gait and mobility    THERAPY DIAG:  Other abnormalities of gait and mobility  Muscle weakness (generalized)  Unsteadiness on feet  PERTINENT HISTORY: R transmetatarsal amputation 10/02/2017, R great toe amputation 08/2016, ADHD, Anxiety, CKD, Clotting disorder, Depression, DM2, Dizziness, HTN, GERD, HIV, Liver disease, Obstructive sleep apnea, PVD   PRECAUTIONS: Fall;  Pt states he is color blind and wears reading glasses.  He also wears hearing aids bilaterally.  Both hearing aids are broken currently.    SUBJECTIVE: No new complaints. "I'm about half nauseous today, I was fully nauseous Tuesday". Denies any pain today. Reports his tea today is half sweet/half un sweet. He's also found a juice that has less sugar.  Has not been able to check his sugars as his sensor expired, supposed to get a new one tomorrow. Can't find any strips or lancets. Planning to ask at the drug store today if he can get these without a prescription, if so he plans to ask them to call the doctor so he can have this as a backup.   PAIN:  Are you having pain? No - took tylenol last night and this morning.  NPRS scale: 0/10 Pain location: low back Pain orientation: Bilateral  PAIN TYPE: chronic Pain  description: intermittent and sharp  Aggravating factors: walking Relieving factors: laying  TODAY'S TREATMENT:  07/11/2021  FUNCTIONAL ASSESSMENTS    [facility] PT Assessment - 07/11/21 1243       Standardized Balance Assessment   Standardized Balance Assessment Five Times Sit to Stand    Five times sit to stand comments  11.28 seconds no UE support from standard height surface      Functional Gait  Assessment   Gait assessed  Yes    Gait Level Surface Walks 20 ft, slow speed, abnormal gait pattern, evidence for imbalance or deviates 10-15 in outside of the 12 in walkway width. Requires more than 7 sec to ambulate 20 ft.   7.13 seconds   Change in Gait Speed Able to smoothly change walking speed without loss of balance or gait deviation. Deviate no more than 6 in outside of the 12 in walkway width.    Gait with Horizontal Head Turns Performs head turns smoothly with slight change in gait velocity (eg, minor disruption to smooth gait path), deviates 6-10 in outside 12 in walkway width, or  uses an assistive device.    Gait with Vertical Head Turns Performs head turns with no change in gait. Deviates no more than 6 in outside 12 in walkway width.    Gait and Pivot Turn Pivot turns safely in greater than 3 sec and stops with no loss of balance, or pivot turns safely within 3 sec and stops with mild imbalance, requires small steps to catch balance.    Step Over Obstacle Is able to step over one shoe box (4.5 in total height) without changing gait speed. No evidence of imbalance.    Gait with Narrow Base of Support Ambulates 4-7 steps.    Gait with Eyes Closed Walks 20 ft, slow speed, abnormal gait pattern, evidence for imbalance, deviates 10-15 in outside 12 in walkway width. Requires more than 9 sec to ambulate 20 ft.    Ambulating Backwards Walks 20 ft, uses assistive device, slower speed, mild gait deviations, deviates 6-10 in outside 12 in walkway width.    Steps Alternating feet, must use rail.     Total Score 19    FGA comment: <19 high fall risk              GAIT: Gait pattern: step through pattern, decreased stride length, decreased hip/knee flexion- Right, decreased hip/knee flexion- Left, shuffling, and narrow BOS Distance walked: 330 x 1 with cane, plus around clinic with session with no AD Assistive device utilized: Single point cane and None Level of assistance:  min guard to min assist with testing. Min guard mostly with gait with one episode of toe scuffing needing min/mod assist for balance correction due to anterior balance loss Comments: cues for sequencing with straight cane (pt's PCP wanted him to try one with therapy to see if it helps his balance).         PATIENT EDUCATION: Education details: progress toward goals Person educated: Patient Education method: Explanation Education comprehension: verbalized understanding and needs further education     HOME EXERCISE PROGRAM: Access Code: [email or web address] URL: [email or web address] Date: 06/12/2021 Prepared by: Willow Ora  Exercises - Sit to Stand with Arms Crossed  - 1 x daily - 5 x weekly - 1 sets - 5-10 reps - Heel Toe Raises with Counter Support  - 1 x daily - 5 x weekly - 1 sets - 10 reps - Standing March with Counter Support  - 1 x daily - 5 x weekly - 1 sets - 10 reps - 2-3 seconds hold - Feet Together Balance at [employer] Eyes Closed  - 1 x daily - 5 x weekly - 1 sets - 3 reps - 30 seconds hold - Standing Tandem Balance with Counter Support  - 1 x daily - 5 x weekly - 1 sets - 2-3 reps - 30 hold  - Standing Anti-Rotation Press with Anchored Resistance  - 1 x daily - 5 x weekly - 2 sets - 10 reps - Anti-Rotation Sidestepping with Resistance  - 1 x daily - 5 x weekly - 2 sets - 10 reps  Added walking program 78mns a day with widened BOS 4 days per week.   GOALS: Goals reviewed with patient? Yes   SHORT TERM GOALS: Target date:  06/25/2021   1. Pt will be independent with strength and  balance HEP with supervision from family.               Baseline:  07/11/21: has program, not very consistent with performance of it  Goal status:  PARTIALLY MET      LONG TERM GOALS: Target date:  07/26/2021 (update and reassess for following 30 days)   Pt will be independent with strength and balance HEP with supervision from family. Baseline: 07/11/2021: has program, not very consistent with performance of it Goal status: PROGRESSING   2.  Pt will decrease 5xSTS to <13 seconds in order to demonstrate decreased risk for falls and improved functional bilateral LE strength and power. Baseline: 07/11/2021: 11.28 seconds no UE support from standard height surface Goal status: MET   3.  Pt will improve FGA score to 22/30 in order to demonstrate improved balance and decreased fall risk. Baseline: 07/11/21: 19/30 scored today Goal status: PROGRESSING   4.  Pt will ambulate 500' w/o AD supervision level with no overt LOB or toe catching on various surfaces. Baseline: 07/11/21: 330 feet x 1 before needing a rest break with cane and min guard to min assist Goal status: NOT MET  5. Pt will negotiate 15 6" stairs using no hand rails w/ reciprocal gait and no LOB. Baseline: bilateral rails reciprocal gait x4 steps, unable to recheck on 07/11/21 due to time constraints            Goal status:  ONGOING   ASSESSMENT:   CLINICAL IMPRESSION: Skilled session focused on checking goals for updated baselines with goals due 07/26/21 at this time. Pt is showing progress and should benefit from continued PT to progress toward unmet goals. Trialed use of cane with session today with up to min assist needed once due to toe catching/balance loss. Pt also interested in trial of rollator, especially on outdoor surfaces. Will work on this next session.      OBJECTIVE IMPAIRMENTS Abnormal gait, decreased activity tolerance, decreased balance, decreased cognition, decreased coordination, decreased endurance,  decreased knowledge of condition, decreased knowledge of use of DME, difficulty walking, and decreased safety awareness.    ACTIVITY LIMITATIONS community activity and yard work.    PERSONAL FACTORS Age, Behavior pattern, Education, Past/current experiences, Social background, and Time since onset of injury/illness/exacerbation are also affecting patient's functional outcome.      REHAB POTENTIAL: [place] PMH and personal factors   CLINICAL DECISION MAKING: Evolving/moderate complexity   EVALUATION COMPLEXITY: Moderate   PLAN: PT FREQUENCY: 1x/week   PT DURATION: 8 weeks   PLANNED INTERVENTIONS: Therapeutic exercises, Therapeutic activity, Neuromuscular re-education, Balance training, Gait training, Patient/Family education, and Joint mobilization   PLAN FOR NEXT SESSION:   Try rollator for longer distance gait, including outdoors on uneven surfaces (pavement, grass, gravel, etc).  Stair training.  Steps up for functional strengthening-dec UE support.  Gait on various surfaces w/o AD.  Backwards walking-widened BOS, tandem walking, dual tasking, toe taps to cone, head turns and nods during static and dynamic balance.     Willow Ora, PTA, [place] [address] [place], [place] [address] [phone] 07/12/21, 12:27 PM

## 2021-07-15 ENCOUNTER — Encounter (HOSPITAL_BASED_OUTPATIENT_CLINIC_OR_DEPARTMENT_OTHER): Payer: Self-pay

## 2021-07-15 ENCOUNTER — Other Ambulatory Visit: Payer: Self-pay

## 2021-07-15 ENCOUNTER — Emergency Department (HOSPITAL_BASED_OUTPATIENT_CLINIC_OR_DEPARTMENT_OTHER): Payer: PPO

## 2021-07-15 ENCOUNTER — Emergency Department (HOSPITAL_BASED_OUTPATIENT_CLINIC_OR_DEPARTMENT_OTHER)
Admission: EM | Admit: 2021-07-15 | Discharge: 2021-07-15 | Disposition: A | Discharge status: Home / Self Care | Payer: PPO | Attending: Emergency Medicine | Admitting: Emergency Medicine

## 2021-07-15 DIAGNOSIS — S0031XA Abrasion of nose, initial encounter: Secondary | ICD-10-CM | POA: Diagnosis not present

## 2021-07-15 DIAGNOSIS — S0181XA Laceration without foreign body of other part of head, initial encounter: Secondary | ICD-10-CM | POA: Diagnosis not present

## 2021-07-15 DIAGNOSIS — W01198A Fall on same level from slipping, tripping and stumbling with subsequent striking against other object, initial encounter: Secondary | ICD-10-CM | POA: Insufficient documentation

## 2021-07-15 DIAGNOSIS — Z043 Encounter for examination and observation following other accident: Secondary | ICD-10-CM | POA: Diagnosis not present

## 2021-07-15 DIAGNOSIS — Z7901 Long term (current) use of anticoagulants: Secondary | ICD-10-CM | POA: Insufficient documentation

## 2021-07-15 DIAGNOSIS — Y92009 Unspecified place in unspecified non-institutional (private) residence as the place of occurrence of the external cause: Secondary | ICD-10-CM | POA: Insufficient documentation

## 2021-07-15 DIAGNOSIS — S0993XA Unspecified injury of face, initial encounter: Secondary | ICD-10-CM | POA: Diagnosis present

## 2021-07-15 DIAGNOSIS — S01111A Laceration without foreign body of right eyelid and periocular area, initial encounter: Secondary | ICD-10-CM | POA: Diagnosis not present

## 2021-07-15 MED ORDER — LIDOCAINE HCL 2 % IJ SOLN
10.0000 mL | Freq: Once | INTRAMUSCULAR | Status: DC
  Filled 2021-07-15: qty 20

## 2021-07-15 MED ORDER — LIDOCAINE-EPINEPHRINE (PF) 2 %-1:200000 IJ SOLN
10.0000 mL | Freq: Once | INTRAMUSCULAR | Status: AC
  Administered 2021-07-15: 10 mL via INTRADERMAL
  Filled 2021-07-15: qty 20

## 2021-07-15 MED ORDER — LIDOCAINE-EPINEPHRINE 2 %-1:100000 IJ SOLN
10.0000 mL | Freq: Once | INTRAMUSCULAR | Status: DC
  Filled 2021-07-15: qty 10.2

## 2021-07-15 NOTE — ED Notes (Signed)
Pt verbalizes understanding of discharge instructions. Opportunity for questioning and answers were provided. Pt discharged from ED to home with family.

## 2021-07-15 NOTE — ED Provider Notes (Signed)
[facility] EMERGENCY DEPT Provider Note   CSN: [id number] Arrival date & time: 07/15/21  2057     History  Chief Complaint  Patient presents with   Daniel [name] is a 66 y.o. male presenting emergency department with a mechanical fall and injury to his right eye.  He said he struck his right forehead on around bedpost this evening.  He has a laceration above his right eyebrow.  He does take Xarelto daily.  He reports a very mild headache and reported he had some small epistaxis episode which is resolved.  HPI     Home Medications Prior to Admission medications   Medication Sig Start Date End Date Taking? Authorizing Provider  acetaminophen (TYLENOL) 500 MG tablet Take 1,500 mg by mouth in the morning and at bedtime.    [provider]  ALPRAZolam Duanne Moron) 1 MG tablet Take 1 mg by mouth at bedtime. *May take one tablet up to 4 times daily as needed for anxiety    [provider]  amLODipine (NORVASC) 5 MG tablet Take 1 tablet (5 mg total) by mouth daily. 02/07/21 05/08/21  [name] E, PA-C  amphetamine-dextroamphetamine (ADDERALL) 30 MG tablet Take 30 mg by mouth 3 (three) times daily.    [provider]  B-D ULTRAFINE III SHORT PEN 31G X 8 MM MISC USE AS DIRECTED THREE TIMES DAILY 10/09/20   Renato Shin, MD  brexpiprazole (REXULTI) 1 MG TABS tablet Take 1 mg by mouth at bedtime.    [provider]  Continuous Blood Gluc Receiver (FREESTYLE LIBRE 2 READER) DEVI Use as instructed to check blood sugars. 04/18/21   Renato Shin, MD  dapagliflozin propanediol (FARXIGA) 10 MG TABS tablet Take 1 tablet (10 mg total) by mouth daily before breakfast. 04/18/21   Renato Shin, MD  diclofenac Sodium (VOLTAREN) 1 % GEL APPLY 2 GM TO EACH KNEE EVERY MORNING AND EVERY NIGHT AT BEDTIME, AND APPLY 1 GRAM TO EACH KNEE EVERY AFTERNOON 06/18/21   [name], MD  divalproex (DEPAKOTE ER) 500 MG 24 hr tablet TAKE 1 TABLET(500 MG) BY  MOUTH AT BEDTIME 05/21/21   [name], MD  Dulaglutide (TRULICITY) 4.5 WN/0.2VO SOPN Inject 4.5 mg as directed once a week. 10/08/20   Renato Shin, MD  Evolocumab (REPATHA SURECLICK) 536 MG/ML SOAJ Inject 140 mg into the skin every 14 (fourteen) days. 04/25/21   Skeet Latch, MD  ezetimibe (ZETIA) 10 MG tablet TAKE 1 TABLET(10 MG) BY MOUTH DAILY 02/20/21   Skeet Latch, MD  furosemide (LASIX) 40 MG tablet Take 1 tablet (40 mg total) by mouth daily. 02/07/21 05/08/21  [name] E, PA-C  insulin aspart (NOVOLOG FLEXPEN) 100 UNIT/ML FlexPen 15 units with breakfast and 25 units with supper.  And pen needles 3/day 04/18/21   Renato Shin, MD  insulin glargine (LANTUS SOLOSTAR) 100 UNIT/ML Solostar Pen Inject 100 Units into the skin every morning. 05/28/21   Renato Shin, MD  levothyroxine (SYNTHROID, LEVOTHROID) 50 MCG tablet Take 1 tablet (50 mcg total) by mouth at bedtime. 07/24/15   [name], MD  ondansetron (ZOFRAN) 8 MG tablet TAKE 1 TABLET BY MOUTH EVERY 8 HOURS AS NEEDED FOR NAUSEA OR VOMITING 07/09/21   [name], MD  pantoprazole (PROTONIX) 40 MG tablet TAKE 1 TABLET(40 MG) BY MOUTH DAILY 06/18/21   [name], MD  pregabalin (LYRICA) 150 MG capsule Take 2 capsules (300 mg total) by mouth 2 (two) times daily. 05/08/21  [name], MD  protriptyline (VIVACTIL) 10 MG tablet Take 10 mg by mouth 3 (three) times daily.  01/30/16   [provider]  silodosin (RAPAFLO) 8 MG CAPS capsule Take 8 mg by mouth daily.    [provider]  TRINTELLIX 20 MG TABS tablet Take 20 mg by mouth at bedtime.  01/26/15   [provider]  TRIUMEQ 600-50-300 MG tablet TAKE 1 TABLET BY MOUTH DAILY 04/23/21   Comer, Okey Regal, MD  XARELTO 20 MG TABS tablet TAKE 1 TABLET(20 MG) BY MOUTH DAILY WITH SUPPER 05/21/21   [name], MD  zolpidem (AMBIEN) 10 MG tablet Take 10 mg by mouth at bedtime.    [provider]      Allergies    Aspirin,  Efavirenz, Ibuprofen, Nsaids, Pravastatin, and Lipitor [atorvastatin calcium]    Review of Systems   Review of Systems  Physical Exam Updated Vital Signs BP 126/70   Pulse 86   Temp 97.9 F (36.6 C)   Resp 17   Ht 5' 10"  (1.778 m)   Wt 115.7 kg   SpO2 96%   BMI 36.59 kg/m  Physical Exam Constitutional:      General: He is not in acute distress. HENT:     Head: Normocephalic and atraumatic.  Eyes:     Conjunctiva/sclera: Conjunctivae normal.     Pupils: Pupils are equal, round, and reactive to light.  Cardiovascular:     Rate and Rhythm: Normal rate and regular rhythm.  Pulmonary:     Effort: Pulmonary effort is normal. No respiratory distress.  Skin:    General: Skin is warm and dry.     Comments: 3 cm horizontal laceration above the right eyebrow, no active bleeding Abrasion across the bridge of the nose with tenderness  Neurological:     General: No focal deficit present.     Mental Status: He is alert. Mental status is at baseline.  Psychiatric:        Mood and Affect: Mood normal.        Behavior: Behavior normal.    ED Results / Procedures / Treatments   Labs (all labs ordered are listed, but only abnormal results are displayed) Labs Reviewed - No data to display  EKG None  Radiology CT Head Wo Contrast  Result Date: 07/15/2021 CLINICAL DATA:  Trip and fall. On blood thinners. There is a laceration above the right eye. EXAM: CT HEAD WITHOUT CONTRAST TECHNIQUE: Contiguous axial images were obtained from the base of the skull through the vertex without intravenous contrast. RADIATION DOSE REDUCTION: This exam was performed according to the departmental dose-optimization program which includes automated exposure control, adjustment of the mA and/or kV according to patient size and/or use of iterative reconstruction technique. COMPARISON:  09/14/2019 FINDINGS: Brain: No evidence of acute infarction, hemorrhage, hydrocephalus, extra-axial collection or mass  lesion/mass effect. Mild cerebral atrophy. Mild ventricular dilatation consistent with central atrophy. Vascular: Arterial vascular calcifications. Skull: Calvarium appears intact. Sinuses/Orbits: Paranasal sinuses and mastoid air cells are clear. Other: None. IMPRESSION: No acute intracranial abnormalities.  Mild chronic atrophy. Electronically Signed   By: Lucienne Capers M.D.   On: 07/15/2021 22:17    Procedures .Marland KitchenLaceration Repair  Date/Time: 07/15/2021 11:09 PM Performed by: Wyvonnia Dusky, MD Authorized by: Wyvonnia Dusky, MD   Consent:    Consent obtained:  Verbal   Consent given by:  Patient   Risks discussed:  Pain, poor cosmetic result and poor wound healing Universal  protocol:    Procedure explained and questions answered to patient or proxy's satisfaction: yes     Immediately prior to procedure, a time out was called: yes     Patient identity confirmed:  Arm band Anesthesia:    Anesthesia method:  Local infiltration   Local anesthetic:  Lidocaine 1% WITH epi Laceration details:    Location:  Face   Face location:  R upper eyelid   Extent:  Partial thickness   Length (cm):  3   Depth (mm):  3 Pre-procedure details:    Preparation:  Patient was prepped and draped in usual sterile fashion and imaging obtained to evaluate for foreign bodies Treatment:    Amount of cleaning:  Standard   Irrigation solution:  Sterile saline Skin repair:    Repair method:  Sutures   Suture size:  5-0   Suture material:  Prolene   Suture technique:  Simple interrupted   Number of sutures:  5 Approximation:    Approximation:  Close Repair type:    Repair type:  Simple Post-procedure details:    Dressing:  Open (no dressing)   Procedure completion:  Tolerated well, no immediate complications    Medications Ordered in ED Medications  lidocaine-EPINEPHrine (XYLOCAINE W/EPI) 2 %-1:200000 (PF) injection 10 mL (10 mLs Intradermal Given 07/15/21 2251)    ED Course/ Medical Decision  Making/ A&P                           Medical Decision Making Amount and/or Complexity of Data Reviewed Radiology: ordered.  Risk Prescription drug management.   Patient is here with mechanical fall and head injury, on Xarelto, CT scan of the head ordered postreview showing no intracranial bleeding.  His laceration was irrigated and repaired at the bedside with stitches.  Instructions for removal of stitches in 5 days provided to the patient.  Low suspicion for infection otherwise.  I do not see any other evidence of significant trauma on exam.  Stable for discharge        Final Clinical Impression(s) / ED Diagnoses Final diagnoses:  Laceration of forehead, initial encounter    Rx / DC Orders ED Discharge Orders     None         Wyvonnia Dusky, MD 07/15/21 2310

## 2021-07-15 NOTE — Discharge Instructions (Signed)
You have 5 stitches placed in her forehead.  This should be removed in 5 days.  This can be done at your doctor's office or in urgent care.  Keep the stitches clean and dry for the next 2 days.  After that you can shower normally.  Do not pull or tug the stitches.  Do not put any ointment on top of that.  You can use a Band-Aid over the stitches if you need to for oozing or bleeding.

## 2021-07-15 NOTE — ED Triage Notes (Signed)
Pt arrives POV after a fall at home around 9:00 pm.  He says he tripped and hit his right eye on a round bed post.  He denies any right eye vision issues.  There is a laceration above the right eye and on the bridge of the nose.  Pt states he takes Xarelto daily.  Pt ambulatory to triage, GCS 15, in NAD.

## 2021-07-16 DIAGNOSIS — M9904 Segmental and somatic dysfunction of sacral region: Secondary | ICD-10-CM | POA: Diagnosis not present

## 2021-07-16 DIAGNOSIS — M5137 Other intervertebral disc degeneration, lumbosacral region: Secondary | ICD-10-CM | POA: Diagnosis not present

## 2021-07-16 DIAGNOSIS — M5135 Other intervertebral disc degeneration, thoracolumbar region: Secondary | ICD-10-CM | POA: Diagnosis not present

## 2021-07-16 DIAGNOSIS — M9902 Segmental and somatic dysfunction of thoracic region: Secondary | ICD-10-CM | POA: Diagnosis not present

## 2021-07-16 DIAGNOSIS — M9903 Segmental and somatic dysfunction of lumbar region: Secondary | ICD-10-CM | POA: Diagnosis not present

## 2021-07-16 DIAGNOSIS — M5136 Other intervertebral disc degeneration, lumbar region: Secondary | ICD-10-CM | POA: Diagnosis not present

## 2021-07-17 ENCOUNTER — Ambulatory Visit: Payer: PPO | Admitting: Physical Therapy

## 2021-07-18 ENCOUNTER — Encounter: Payer: Self-pay | Admitting: Physical Therapy

## 2021-07-18 ENCOUNTER — Ambulatory Visit: Payer: PPO | Admitting: Physical Therapy

## 2021-07-18 DIAGNOSIS — R2689 Other abnormalities of gait and mobility: Secondary | ICD-10-CM | POA: Diagnosis not present

## 2021-07-18 DIAGNOSIS — G8929 Other chronic pain: Secondary | ICD-10-CM

## 2021-07-18 DIAGNOSIS — M6281 Muscle weakness (generalized): Secondary | ICD-10-CM

## 2021-07-18 DIAGNOSIS — R2681 Unsteadiness on feet: Secondary | ICD-10-CM

## 2021-07-18 NOTE — Therapy (Signed)
OUTPATIENT PHYSICAL THERAPY TREATMENT NOTE   Patient Name: Daniel [name] MRN: [id number] DOB:1955-12-13, 66 y.o., male Today's Date: 07/19/2021  PCP: [name], MD REFERRING PROVIDER: [name], MD  END OF SESSION:   PT End of Session - 07/18/21 1324     Visit Number 6    Number of Visits 9   8+eval   Date for PT Re-Evaluation 07/26/21    Authorization Type [employer]    PT Start Time 1319    PT Stop Time 1403    PT Time Calculation (min) 44 min    Equipment Utilized During Treatment Gait belt    Activity Tolerance Patient tolerated treatment well    Behavior During Therapy Flat affect;WFL for tasks assessed/performed             Past Medical History:  Diagnosis Date   ADHD (attention deficit hyperactivity disorder)    Anxiety    Chronic kidney disease    Clotting disorder ([place])    Depression    Diabetes mellitus without complication ([place])    Diabetes mellitus, type II ([place])    Dizziness 03/17/2015   Essential hypertension 06/25/2018   GERD (gastroesophageal reflux disease)    HIV disease ([place])    HIV infection ([place])    Liver disease    OSA (obstructive sleep apnea) 07/25/2015   Uses CPAP regularly   Peripheral vascular disease ([place])    Ulcer    Past Surgical History:  Procedure Laterality Date   AMPUTATION Right 10/02/2017   Procedure: RIGHT TRANSMETATARSAL AMPUTATION;  Surgeon: Leandrew Koyanagi, MD;  Location: [facility];  Service: Orthopedics;  Laterality: Right;   SMALL INTESTINE SURGERY     STOMACH SURGERY     TOE AMPUTATION Right 08/2016   right great toe   Patient Active Problem List   Diagnosis Date Noted   Insomnia due to mental condition 05/23/2021   Atherosclerosis of native artery of extremity ([place]) 04/25/2021   Chronic nonalcoholic liver disease 62/95/2841   History of hepatitis B 04/25/2021   HIV positive ([place]) 04/25/2021   Long term (current) use of insulin ([place]) 04/25/2021   Long term (current) use of anticoagulants  32/44/0102   Nonalcoholic steatohepatitis ([place]) 04/25/2021   Type 2 diabetes mellitus with other circulatory complications ([place]) 72/53/6644   Type 2 diabetes mellitus with peripheral angiopathy ([place]) 04/25/2021   Abnormal liver function tests 04/25/2021   Statin myopathy 02/12/2021   Unilateral primary osteoarthritis, right knee 12/15/2019   Generalized weakness 09/14/2019   Severe major depression ([place]) 11/17/2018   Class 2 obesity due to excess calories without serious comorbidity with body mass index (BMI) of 36.0 to 36.9 in adult 11/17/2018   Anticoagulated 08/24/2018   Dyslipidemia 07/27/2018   Essential hypertension 06/25/2018   Hypogonadism in male 05/10/2018   Numbness 03/31/2018   Diabetic peripheral neuropathy ([place]) 01/25/2018   S/P transmetatarsal amputation of foot, right ([place]) 10/09/2017   Hypothyroidism    Constipation    Obesity, Class III, BMI 40-49.9 (morbid obesity) ([place]) 01/15/2017   DOE (dyspnea on exertion) 01/13/2017   Imbalance 11/18/2016   Osteomyelitis of right foot ([place]) 09/22/2016   Chronic migraine 09/17/2016   Gait abnormality 09/17/2016   Fall 12/05/2015   Toe ulcer, right ([place]) 09/19/2015   Decreased pedal pulses 09/19/2015   OSA on CPAP 09/05/2015   Major depressive disorder, recurrent episode, moderate ([place]) 09/05/2015   Recurrent falls while walking 06/24/2015   H/O migraine 06/22/2015   Sinus headache 06/21/2015  OSA (obstructive sleep apnea) 06/20/2015   History of DVT (deep vein thrombosis) 06/20/2015   History of falling 06/20/2015   Chronic, continuous use of opioids 06/20/2015   Coagulopathy ([place]) 06/20/2015   Elevated CPK 06/20/2015   Pain syndrome, chronic 06/20/2015   Thrombocythemia 06/20/2015   Uncontrolled type 2 diabetes mellitus with hyperglycemia ([place]) 06/20/2015   Low back pain 06/11/2015   Unspecified abnormalities of gait and mobility 06/11/2015   Benign paroxysmal positional vertigo of right ear 05/01/2015   Chronic  kidney disease, stage III (moderate) ([place]) 08/09/2014   Renal insufficiency 08/09/2014   Insulin-requiring or dependent type II diabetes mellitus ([place]) 11/07/2013   Diabetes mellitus ([place]) 11/07/2013   Steatosis of liver 09/09/2010   Human immunodeficiency virus (HIV) infection ([place]) 06/04/2006   Herpes zoster 06/04/2006   Depression 06/04/2006   THROMBOPHLEBITIS NOS 06/04/2006   Gastroesophageal reflux disease 06/04/2006   ARTHRITIS, HAND 06/04/2006   Attention deficit hyperactivity disorder 06/04/2006   Abnormal blood chemistry level 06/04/2006    REFERRING DIAG:  G47.33,Z99.89 (ICD-10-CM) - OSA on CPAP F51.05 (ICD-10-CM) - Insomnia due to mental condition R26.9 (ICD-10-CM) - Unspecified abnormalities of gait and mobility    THERAPY DIAG:  Other abnormalities of gait and mobility  Muscle weakness (generalized)  Unsteadiness on feet  Chronic bilateral low back pain, unspecified whether sciatica present  PERTINENT HISTORY: R transmetatarsal amputation 10/02/2017, R great toe amputation 08/2016, ADHD, Anxiety, CKD, Clotting disorder, Depression, DM2, Dizziness, HTN, GERD, HIV, Liver disease, Obstructive sleep apnea, PVD   PRECAUTIONS: Fall;  Pt states he is color blind and wears reading glasses.  He also wears hearing aids bilaterally.  Both hearing aids are broken currently.    SUBJECTIVE:  He states he did not bring monitor to his freestyle libre to session, but his blood sugar was 140 at 12:30pm today.  He elaborates on how he cut his eye by stumbling around his room and landing with his right eye onto his solid wood bedpost last night and had stitches placed in [facility] ED.  He further elaborates on other issues and appts with other medical providers.  States he is unable to obtain additional pills for his neuropathy as he is supposed to be taking 4 a day but only is able to take 3 a day.  Has chiropractor appt every Tuesday stating he was very sore following this most recent visit.   Pt states he is not doing HEP though he still has paper.  PAIN:  Are you having pain? No NPRS scale: 0/10 Pain location: low back Pain orientation: Bilateral  PAIN TYPE: chronic Pain description: intermittent and sharp  Aggravating factors: walking Relieving factors: laying  TODAY'S TREATMENT:   GAIT: Gait pattern: step through pattern, decreased stride length, decreased hip/knee flexion- Right, decreased hip/knee flexion- Left, shuffling, and narrow BOS Distance walked: 500' w/ rollator + 230' with tripod cane, plus around clinic with session with no AD Assistive device utilized: Walker - 4 wheeled, tripod cane, and None Level of assistance: CGA and Min A MinA x1 LOB forward due to catching R foot on L foot. Comments: Pt cued for approximation and posture with rollator as pt has increased forward lean and is unable to safely control gait speed with rollator.  He tolerates tripod cane better with improved steps and no evidence of catching as well as improved posture.  He has some difficulty with sequencing of cane maintaining pattern with intermittent loss of pattern with min verbal cues and demonstration.  Pt  requires some redirection to task due to hyperverbosity.   STAIRS:  Level of Assistance: CGA  Stair Negotiation Technique: Step to Pattern Alternating Pattern  Forwards with Single Rail on Right Bilateral Rails  Number of Stairs: 20   Height of Stairs: 6"  Comments: Pt intermittently uses unilateral and bilateral hand rails and reciprocal steps and demonstrates mild posterior lean during descent, he prefers to descend stairs w/ step-to and states he uses this technique most often in community; pt demonstrates intermittent closing of right eye when descending stairs and when PT inquires about this due to safety, he states is not voluntary but related to swelling.  Due to ongoing issues with facial laceration he was advised to continue using at least one rail and step-to technique to  decrease fall risk.     PATIENT EDUCATION: Education details: Edu on bringing blood sugar meter to therapy session next visit due to high fluctuation of blood sugars experienced and reported and PT being unable to assess baseline in prior sessions.  He was informed that PT may not be able to see him if he does not bring it due to increased risk with exercise when blood sugar too high or low.  Edu on rollator being unsafe option at this time (pt's PCP recommended use of rollator per pt report).  Edu on compliance to HEP in order to make functional gains. Person educated: Patient Education method: Explanation Education comprehension: verbalized understanding and needs further education     HOME EXERCISE PROGRAM: Access Code: [email or web address] URL: [email or web address] Date: 06/12/2021 Prepared by: Willow Ora  Exercises - Sit to Stand with Arms Crossed  - 1 x daily - 5 x weekly - 1 sets - 5-10 reps - Heel Toe Raises with Counter Support  - 1 x daily - 5 x weekly - 1 sets - 10 reps - Standing March with Counter Support  - 1 x daily - 5 x weekly - 1 sets - 10 reps - 2-3 seconds hold - Feet Together Balance at [employer] Eyes Closed  - 1 x daily - 5 x weekly - 1 sets - 3 reps - 30 seconds hold - Standing Tandem Balance with Counter Support  - 1 x daily - 5 x weekly - 1 sets - 2-3 reps - 30 hold  - Standing Anti-Rotation Press with Anchored Resistance  - 1 x daily - 5 x weekly - 2 sets - 10 reps - Anti-Rotation Sidestepping with Resistance  - 1 x daily - 5 x weekly - 2 sets - 10 reps  Added walking program 88mns a day with widened BOS 4 days per week.   GOALS: Goals reviewed with patient? Yes   SHORT TERM GOALS: Target date:  06/25/2021   1. Pt will be independent with strength and balance HEP with supervision from family.               Baseline:  07/11/21: has program, not very consistent with performance of it             Goal status:  PARTIALLY MET      LONG TERM GOALS: Target  date:  07/26/2021 (update and reassess for following 30 days)   Pt will be independent with strength and balance HEP with supervision from family. Baseline: 07/11/2021: has program, not very consistent with performance of it Goal status: PROGRESSING   2.  Pt will decrease 5xSTS to <13 seconds in order to demonstrate decreased risk for falls and improved functional bilateral LE  strength and power. Baseline: 07/11/2021: 11.28 seconds no UE support from standard height surface Goal status: MET   3.  Pt will improve FGA score to 22/30 in order to demonstrate improved balance and decreased fall risk. Baseline: 07/11/21: 19/30 scored today Goal status: PROGRESSING   4.  Pt will ambulate 500' w/o AD supervision level with no overt LOB or toe catching on various surfaces. Baseline: 07/11/21: 330 feet x 1 before needing a rest break with cane and min guard to min assist Goal status: NOT MET  5. Pt will negotiate 15 6" stairs using single hand rail w/ reciprocal gait and no LOB. Baseline: bilateral rails reciprocal gait x4 steps, unable to recheck on 07/11/21 due to time constraints            Goal status:  REVISED (07/18/2021)   ASSESSMENT:   CLINICAL IMPRESSION: Session focused on addressing gait with AD comparing rollator to tripod cane use for decreased fall risk.  Rollator is unsafe at this time, but with further practice in coming sessions, tripod cane could be good option for patient in community environments.  Continued to address stair negotiation with pt goal revised to reflect current functional status.  Will continue to address deficits as blood sugar allows.     OBJECTIVE IMPAIRMENTS Abnormal gait, decreased activity tolerance, decreased balance, decreased cognition, decreased coordination, decreased endurance, decreased knowledge of condition, decreased knowledge of use of DME, difficulty walking, and decreased safety awareness.    ACTIVITY LIMITATIONS community activity and yard work.     PERSONAL FACTORS Age, Behavior pattern, Education, Past/current experiences, Social background, and Time since onset of injury/illness/exacerbation are also affecting patient's functional outcome.      REHAB POTENTIAL: [place] PMH and personal factors   CLINICAL DECISION MAKING: Evolving/moderate complexity   EVALUATION COMPLEXITY: Moderate   PLAN: PT FREQUENCY: 1x/week   PT DURATION: 8 weeks   PLANNED INTERVENTIONS: Therapeutic exercises, Therapeutic activity, Neuromuscular re-education, Balance training, Gait training, Patient/Family education, and Joint mobilization   PLAN FOR NEXT SESSION:   Gait training with SPC vs tripod cane on various surfaces.  Stair training.  Steps up for functional strengthening-dec UE support.  Gait on various surfaces w/o AD.  Backwards walking-widened BOS, tandem walking, dual tasking, toe taps to cone, head turns and nods during static and dynamic balance.  Elease Etienne, PT, DPT [facility] [address] [place], [place] [address] [phone] 07/19/21, 9:04 AM

## 2021-07-21 ENCOUNTER — Encounter (HOSPITAL_BASED_OUTPATIENT_CLINIC_OR_DEPARTMENT_OTHER): Payer: Self-pay | Admitting: Emergency Medicine

## 2021-07-21 ENCOUNTER — Other Ambulatory Visit: Payer: Self-pay

## 2021-07-21 ENCOUNTER — Emergency Department (HOSPITAL_BASED_OUTPATIENT_CLINIC_OR_DEPARTMENT_OTHER)
Admission: EM | Admit: 2021-07-21 | Discharge: 2021-07-21 | Disposition: A | Discharge status: Home / Self Care | Payer: PPO | Attending: Emergency Medicine | Admitting: Emergency Medicine

## 2021-07-21 DIAGNOSIS — Z7901 Long term (current) use of anticoagulants: Secondary | ICD-10-CM | POA: Diagnosis not present

## 2021-07-21 DIAGNOSIS — Z794 Long term (current) use of insulin: Secondary | ICD-10-CM | POA: Insufficient documentation

## 2021-07-21 DIAGNOSIS — Z79899 Other long term (current) drug therapy: Secondary | ICD-10-CM | POA: Diagnosis not present

## 2021-07-21 DIAGNOSIS — Z4802 Encounter for removal of sutures: Secondary | ICD-10-CM

## 2021-07-21 NOTE — ED Notes (Signed)
Suture removal kit at bedside 

## 2021-07-21 NOTE — ED Triage Notes (Signed)
Pt here for stitches to be removed.

## 2021-07-21 NOTE — Discharge Instructions (Addendum)
As we discussed I would continue to put a small dab of some Polysporin or Neosporin directly over your laceration site for the next 2 to 3 days until the wound is completely healed.  Continue to wear sunscreen over the wound every day until the scar is fully matured at least 6 months to a year for best cosmetic results.  Continue to monitor for signs of infection including worsening redness, swelling, pus draining from the affected area.  Please return to the emergency department if you notice any of these worrisome symptoms.

## 2021-07-21 NOTE — ED Provider Notes (Signed)
[name] EMERGENCY DEPT Provider Note   CSN: [id number] Arrival date & time: 07/21/21  1554     History  Chief Complaint  Patient presents with   Suture / Staple Removal    Daniel [name] is a 66 y.o. male who presents to the emergency department with a history of mechanical fall, injury to the right eye for suture removal after sutures were placed 6 days ago.  Patient reports that the wound has been healing well at this time.  He does endorse some ongoing soreness from the fall but reports that he is controlling with at home pain medication.   Suture / Staple Removal      Home Medications Prior to Admission medications   Medication Sig Start Date End Date Taking? Authorizing Provider  acetaminophen (TYLENOL) 500 MG tablet Take 1,500 mg by mouth in the morning and at bedtime.    [provider]  ALPRAZolam Duanne Moron) 1 MG tablet Take 1 mg by mouth at bedtime. *May take one tablet up to 4 times daily as needed for anxiety    [provider]  amLODipine (NORVASC) 5 MG tablet Take 1 tablet (5 mg total) by mouth daily. 02/07/21 05/08/21  [name] E, PA-C  amphetamine-dextroamphetamine (ADDERALL) 30 MG tablet Take 30 mg by mouth 3 (three) times daily.    [provider]  B-D ULTRAFINE III SHORT PEN 31G X 8 MM MISC USE AS DIRECTED THREE TIMES DAILY 10/09/20   Renato Shin, MD  brexpiprazole (REXULTI) 1 MG TABS tablet Take 1 mg by mouth at bedtime.    [provider]  Continuous Blood Gluc Receiver (FREESTYLE LIBRE 2 READER) DEVI Use as instructed to check blood sugars. 04/18/21   Renato Shin, MD  dapagliflozin propanediol (FARXIGA) 10 MG TABS tablet Take 1 tablet (10 mg total) by mouth daily before breakfast. 04/18/21   Renato Shin, MD  diclofenac Sodium (VOLTAREN) 1 % GEL APPLY 2 GM TO EACH KNEE EVERY MORNING AND EVERY NIGHT AT BEDTIME, AND APPLY 1 GRAM TO EACH KNEE EVERY AFTERNOON 06/18/21   [name], MD  divalproex  (DEPAKOTE ER) 500 MG 24 hr tablet TAKE 1 TABLET(500 MG) BY MOUTH AT BEDTIME 05/21/21   [name], MD  Dulaglutide (TRULICITY) 4.5 LJ/4.4BE SOPN Inject 4.5 mg as directed once a week. 10/08/20   Renato Shin, MD  Evolocumab (REPATHA SURECLICK) 010 MG/ML SOAJ Inject 140 mg into the skin every 14 (fourteen) days. 04/25/21   Skeet Latch, MD  ezetimibe (ZETIA) 10 MG tablet TAKE 1 TABLET(10 MG) BY MOUTH DAILY 02/20/21   Skeet Latch, MD  furosemide (LASIX) 40 MG tablet Take 1 tablet (40 mg total) by mouth daily. 02/07/21 05/08/21  [name] E, PA-C  insulin aspart (NOVOLOG FLEXPEN) 100 UNIT/ML FlexPen 15 units with breakfast and 25 units with supper.  And pen needles 3/day 04/18/21   Renato Shin, MD  insulin glargine (LANTUS SOLOSTAR) 100 UNIT/ML Solostar Pen Inject 100 Units into the skin every morning. 05/28/21   Renato Shin, MD  levothyroxine (SYNTHROID, LEVOTHROID) 50 MCG tablet Take 1 tablet (50 mcg total) by mouth at bedtime. 07/24/15   [name], MD  ondansetron (ZOFRAN) 8 MG tablet TAKE 1 TABLET BY MOUTH EVERY 8 HOURS AS NEEDED FOR NAUSEA OR VOMITING 07/09/21   [name], MD  pantoprazole (PROTONIX) 40 MG tablet TAKE 1 TABLET(40 MG) BY MOUTH DAILY 06/18/21   [name], MD  pregabalin (LYRICA) 150 MG capsule Take 2 capsules (  300 mg total) by mouth 2 (two) times daily. 05/08/21   [name], MD  protriptyline (VIVACTIL) 10 MG tablet Take 10 mg by mouth 3 (three) times daily.  01/30/16   [provider]  silodosin (RAPAFLO) 8 MG CAPS capsule Take 8 mg by mouth daily.    [provider]  TRINTELLIX 20 MG TABS tablet Take 20 mg by mouth at bedtime.  01/26/15   [provider]  TRIUMEQ 600-50-300 MG tablet TAKE 1 TABLET BY MOUTH DAILY 04/23/21   Comer, Okey Regal, MD  XARELTO 20 MG TABS tablet TAKE 1 TABLET(20 MG) BY MOUTH DAILY WITH SUPPER 05/21/21   [name], MD  zolpidem (AMBIEN) 10 MG tablet Take 10 mg by mouth at bedtime.     [provider]      Allergies    Aspirin, Efavirenz, Ibuprofen, Nsaids, Pravastatin, and Lipitor [atorvastatin calcium]    Review of Systems   Review of Systems  Skin:  Positive for wound.  All other systems reviewed and are negative.  Physical Exam Updated Vital Signs BP (!) 153/83   Pulse 93   Temp 98.2 F (36.8 C) (Oral)   Resp 16   SpO2 98%  Physical Exam Vitals and nursing note reviewed.  Constitutional:      General: He is not in acute distress.    Appearance: Normal appearance.  HENT:     Head: Normocephalic and atraumatic.  Eyes:     General:        Right eye: No discharge.        Left eye: No discharge.  Cardiovascular:     Rate and Rhythm: Normal rate and regular rhythm.  Pulmonary:     Effort: Pulmonary effort is normal. No respiratory distress.  Musculoskeletal:        General: No deformity.  Skin:    General: Skin is warm and dry.     Comments: Patient with well-healed laceration, with 5 sutures in place on the right forehead just overlying the eyebrow.  There were 5 sutures in place, no evidence of secondary infection.  Bleeding noted.  Neurological:     Mental Status: He is alert and oriented to person, place, and time.  Psychiatric:        Mood and Affect: Mood normal.        Behavior: Behavior normal.    ED Results / Procedures / Treatments   Labs (all labs ordered are listed, but only abnormal results are displayed) Labs Reviewed - No data to display  EKG None  Radiology No results found.  Procedures .Suture Removal  Date/Time: 07/21/2021 4:14 PM Performed by: Anselmo Pickler, PA-C Authorized by: Anselmo Pickler, PA-C   Consent:    Consent obtained:  Verbal   Consent given by:  Patient   Risks, benefits, and alternatives were discussed: yes     Risks discussed:  Bleeding, pain and wound separation   Alternatives discussed:  No treatment and delayed treatment Universal protocol:    Procedure explained and  questions answered to patient or proxy's satisfaction: yes     Patient identity confirmed:  Verbally with patient Location:    Location:  Head/neck   Head/neck location:  Forehead Procedure details:    Wound appearance:  No signs of infection, good wound healing, clean and pink   Number of sutures removed:  5 Post-procedure details:    Post-removal:  No dressing applied   Procedure completion:  Tolerated    Medications  Ordered in ED Medications - No data to display  ED Course/ Medical Decision Making/ A&P                           Medical Decision Making  This is an overall well-appearing male who presents after mechanical fall 6 days ago for suture removal above the right eyebrow.  I removed the sutures as described above.  Patient Does endorse some ongoing pain but is controlled without home pain medications.  I do not see any worrisome signs of infection at this time.  Patient discharged in stable condition at this time, continued wound care instructions given.  Return precautions given for signs of infection. Final Clinical Impression(s) / ED Diagnoses Final diagnoses:  Encounter for removal of sutures    Rx / DC Orders ED Discharge Orders     None         Dorien Chihuahua 07/21/21 1614    Regan Lemming, MD 07/21/21 [phone]

## 2021-07-23 ENCOUNTER — Telehealth: Payer: Self-pay | Admitting: Family Medicine

## 2021-07-23 DIAGNOSIS — M5135 Other intervertebral disc degeneration, thoracolumbar region: Secondary | ICD-10-CM | POA: Diagnosis not present

## 2021-07-23 DIAGNOSIS — M5136 Other intervertebral disc degeneration, lumbar region: Secondary | ICD-10-CM | POA: Diagnosis not present

## 2021-07-23 DIAGNOSIS — M5137 Other intervertebral disc degeneration, lumbosacral region: Secondary | ICD-10-CM | POA: Diagnosis not present

## 2021-07-23 DIAGNOSIS — M9904 Segmental and somatic dysfunction of sacral region: Secondary | ICD-10-CM | POA: Diagnosis not present

## 2021-07-23 DIAGNOSIS — M9903 Segmental and somatic dysfunction of lumbar region: Secondary | ICD-10-CM | POA: Diagnosis not present

## 2021-07-23 DIAGNOSIS — M9902 Segmental and somatic dysfunction of thoracic region: Secondary | ICD-10-CM | POA: Diagnosis not present

## 2021-07-23 NOTE — Telephone Encounter (Signed)
PT stating that the reason insurance denied the 120 capsules of Pregablin 150 mg because the insurance tried to reach out, didn't get a response. Pt will drop off form tomorrow after 3pm.

## 2021-07-24 ENCOUNTER — Encounter: Payer: Self-pay | Admitting: Physical Therapy

## 2021-07-24 ENCOUNTER — Ambulatory Visit: Payer: PPO | Admitting: Physical Therapy

## 2021-07-24 DIAGNOSIS — R2681 Unsteadiness on feet: Secondary | ICD-10-CM

## 2021-07-24 DIAGNOSIS — M6281 Muscle weakness (generalized): Secondary | ICD-10-CM

## 2021-07-24 DIAGNOSIS — R2689 Other abnormalities of gait and mobility: Secondary | ICD-10-CM

## 2021-07-24 NOTE — Therapy (Unsigned)
OUTPATIENT PHYSICAL THERAPY TREATMENT NOTE/DISCHARGE SUMMARY   Patient Name: Daniel [name] MRN: [id number] DOB:Jul 30, 1955, 66 y.o., male Today's Date: 07/25/2021  PCP: [name], MD REFERRING PROVIDER: [name], MD PHYSICAL THERAPY DISCHARGE SUMMARY  Visits from Start of Care: 7  Current functional level related to goals / functional outcomes: See clinical impression statement.   Remaining deficits: Dynamic balance, generalized weakness, gait deviations, posterior LOB intermittently   Education / Equipment: Edu on D/C plan and use of HEP to maintain and maximize gains independently.   Patient agrees to discharge. Patient goals were not met. Patient is being discharged due to maximized rehab potential.   END OF SESSION:   PT End of Session - 07/24/21 1454     Visit Number 7    Number of Visits 9   8+eval   Date for PT Re-Evaluation 07/26/21    Authorization Type [employer]    PT Start Time 1450    PT Stop Time 1532    PT Time Calculation (min) 42 min    Equipment Utilized During Treatment Gait belt    Activity Tolerance Patient tolerated treatment well    Behavior During Therapy Flat affect;WFL for tasks assessed/performed             Past Medical History:  Diagnosis Date   ADHD (attention deficit hyperactivity disorder)    Anxiety    Chronic kidney disease    Clotting disorder ([place])    Depression    Diabetes mellitus without complication ([place])    Diabetes mellitus, type II ([place])    Dizziness 03/17/2015   Essential hypertension 06/25/2018   GERD (gastroesophageal reflux disease)    HIV disease ([name])    HIV infection ([place])    Liver disease    OSA (obstructive sleep apnea) 07/25/2015   Uses CPAP regularly   Peripheral vascular disease ([place])    Ulcer    Past Surgical History:  Procedure Laterality Date   AMPUTATION Right 10/02/2017   Procedure: RIGHT TRANSMETATARSAL AMPUTATION;  Surgeon: Leandrew Koyanagi, MD;  Location: [facility];   Service: Orthopedics;  Laterality: Right;   SMALL INTESTINE SURGERY     STOMACH SURGERY     TOE AMPUTATION Right 08/2016   right great toe   Patient Active Problem List   Diagnosis Date Noted   Insomnia due to mental condition 05/23/2021   Atherosclerosis of native artery of extremity ([place]) 04/25/2021   Chronic nonalcoholic liver disease 58/52/7782   History of hepatitis B 04/25/2021   HIV positive ([place]) 04/25/2021   Long term (current) use of insulin ([place]) 04/25/2021   Long term (current) use of anticoagulants 42/35/3614   Nonalcoholic steatohepatitis ([place]) 04/25/2021   Type 2 diabetes mellitus with other circulatory complications ([place]) 43/15/4008   Type 2 diabetes mellitus with peripheral angiopathy ([place]) 04/25/2021   Abnormal liver function tests 04/25/2021   Statin myopathy 02/12/2021   Unilateral primary osteoarthritis, right knee 12/15/2019   Generalized weakness 09/14/2019   Severe major depression ([place]) 11/17/2018   Class 2 obesity due to excess calories without serious comorbidity with body mass index (BMI) of 36.0 to 36.9 in adult 11/17/2018   Anticoagulated 08/24/2018   Dyslipidemia 07/27/2018   Essential hypertension 06/25/2018   Hypogonadism in male 05/10/2018   Numbness 03/31/2018   Diabetic peripheral neuropathy ([place]) 01/25/2018   S/P transmetatarsal amputation of foot, right ([place]) 10/09/2017   Hypothyroidism    Constipation    Obesity, Class III, BMI 40-49.9 (morbid obesity) ([place]) 01/15/2017  DOE (dyspnea on exertion) 01/13/2017   Imbalance 11/18/2016   Osteomyelitis of right foot ([name]) 09/22/2016   Chronic migraine 09/17/2016   Gait abnormality 09/17/2016   Fall 12/05/2015   Toe ulcer, right ([place]) 09/19/2015   Decreased pedal pulses 09/19/2015   OSA on CPAP 09/05/2015   Major depressive disorder, recurrent episode, moderate ([place]) 09/05/2015   Recurrent falls while walking 06/24/2015   H/O migraine 06/22/2015   Sinus headache 06/21/2015   OSA  (obstructive sleep apnea) 06/20/2015   History of DVT (deep vein thrombosis) 06/20/2015   History of falling 06/20/2015   Chronic, continuous use of opioids 06/20/2015   Coagulopathy ([place]) 06/20/2015   Elevated CPK 06/20/2015   Pain syndrome, chronic 06/20/2015   Thrombocythemia 06/20/2015   Uncontrolled type 2 diabetes mellitus with hyperglycemia ([place]) 06/20/2015   Low back pain 06/11/2015   Unspecified abnormalities of gait and mobility 06/11/2015   Benign paroxysmal positional vertigo of right ear 05/01/2015   Chronic kidney disease, stage III (moderate) ([place]) 08/09/2014   Renal insufficiency 08/09/2014   Insulin-requiring or dependent type II diabetes mellitus ([place]) 11/07/2013   Diabetes mellitus ([place]) 11/07/2013   Steatosis of liver 09/09/2010   Human immunodeficiency virus (HIV) infection ([place]) 06/04/2006   Herpes zoster 06/04/2006   Depression 06/04/2006   THROMBOPHLEBITIS NOS 06/04/2006   Gastroesophageal reflux disease 06/04/2006   ARTHRITIS, HAND 06/04/2006   Attention deficit hyperactivity disorder 06/04/2006   Abnormal blood chemistry level 06/04/2006    REFERRING DIAG:  G47.33,Z99.89 (ICD-10-CM) - OSA on CPAP F51.05 (ICD-10-CM) - Insomnia due to mental condition R26.9 (ICD-10-CM) - Unspecified abnormalities of gait and mobility    THERAPY DIAG:  Other abnormalities of gait and mobility  Muscle weakness (generalized)  Unsteadiness on feet  PERTINENT HISTORY: R transmetatarsal amputation 10/02/2017, R great toe amputation 08/2016, ADHD, Anxiety, CKD, Clotting disorder, Depression, DM2, Dizziness, HTN, GERD, HIV, Liver disease, Obstructive sleep apnea, PVD   PRECAUTIONS: Fall;  Pt states he is color blind and wears reading glasses.  He also wears hearing aids bilaterally.  Both hearing aids are broken currently.    SUBJECTIVE:  Pt ambulates into clinic with [facility] that his neighbor gave him, has been using for about a week.  Took it to the store, but put it in the  electric cart and rode that around in the grocery store.  He has had stitches removed since last visit (laceration to forehead and bridge of nose appears closed).  He feel last Wednesday night following PT, states he stumbled backwards.  Pt recounts fall prior to that.  He is not working on his HEP.  PAIN:  Are you having pain? Yes NPRS scale: 2-3/10 Pain location: low back  Pain orientation: Right  PAIN TYPE: chronic Pain description: intermittent and sharp  Aggravating factors: walking Relieving factors: laying VITALS: Glucose 248 at onset of session Glucose 309 at end of session Left BP at end of session:  111/81, HR 99 bpm  TODAY'S TREATMENT:   GAIT: Gait pattern: step through pattern, decreased stride length, decreased hip/knee flexion- Right, decreased hip/knee flexion- Left, and narrow BOS Distance walked: 300' Assistive device utilized: Single point cane Level of assistance: SBA and CGA  Comments: Pt demonstrates improved gait pattern with use of SPC this session, cued to maintain cane on single side consistently for most benefit during ambulation using AD long-term.  He demonstrates single incidence of scissoring gait during turning left on track requiring CGA to steady.   STAIRS:  Level of Assistance: SBA  Stair Negotiation Technique: Alternating Pattern  Forwards with Single Rail on Right Bilateral Rails-single rail on right w/ SPC on left  Number of Stairs: 4 + 16   Height of Stairs: 6"     Comments:  Min verbal cuing to promote safe gait pattern when using SPC and rail during stair management.  [facility] PT Assessment - 07/24/21 1514       Functional Gait  Assessment   Gait assessed  Yes    Gait Level Surface Walks 20 ft in less than 7 sec but greater than 5.5 sec, uses assistive device, slower speed, mild gait deviations, or deviates 6-10 in outside of the 12 in walkway width.    Change in Gait Speed Able to smoothly change walking speed without loss of balance or gait  deviation. Deviate no more than 6 in outside of the 12 in walkway width.    Gait with Horizontal Head Turns Performs head turns smoothly with slight change in gait velocity (eg, minor disruption to smooth gait path), deviates 6-10 in outside 12 in walkway width, or uses an assistive device.    Gait with Vertical Head Turns Performs head turns with no change in gait. Deviates no more than 6 in outside 12 in walkway width.    Gait and Pivot Turn Pivot turns safely in greater than 3 sec and stops with no loss of balance, or pivot turns safely within 3 sec and stops with mild imbalance, requires small steps to catch balance.    Step Over Obstacle Is able to step over one shoe box (4.5 in total height) without changing gait speed. No evidence of imbalance.    Gait with Narrow Base of Support Ambulates 4-7 steps.    Gait with Eyes Closed Walks 20 ft, slow speed, abnormal gait pattern, evidence for imbalance, deviates 10-15 in outside 12 in walkway width. Requires more than 9 sec to ambulate 20 ft.    Ambulating Backwards Walks 20 ft, uses assistive device, slower speed, mild gait deviations, deviates 6-10 in outside 12 in walkway width.    Steps Alternating feet, must use rail.    Total Score 20    FGA comment: Medium Fall Risk            PATIENT EDUCATION: Education details: D/C plan today and use of SPC on consistent side of body during ambulation and pattern for use of SPC with stairs. Person educated: Patient Education method: Explanation Education comprehension: verbalized understanding and needs further education     HOME EXERCISE PROGRAM: Access Code: [email or web address] URL: [email or web address] Date: 06/12/2021 Prepared by: Willow Ora  Exercises - Sit to Stand with Arms Crossed  - 1 x daily - 5 x weekly - 1 sets - 5-10 reps - Heel Toe Raises with Counter Support  - 1 x daily - 5 x weekly - 1 sets - 10 reps - Standing March with Counter Support  - 1 x daily - 5 x weekly - 1  sets - 10 reps - 2-3 seconds hold - Feet Together Balance at [employer] Eyes Closed  - 1 x daily - 5 x weekly - 1 sets - 3 reps - 30 seconds hold - Standing Tandem Balance with Counter Support  - 1 x daily - 5 x weekly - 1 sets - 2-3 reps - 30 hold  - Standing Anti-Rotation Press with Anchored Resistance  - 1 x daily - 5 x weekly - 2 sets - 10 reps - Anti-Rotation Sidestepping with Resistance  -  1 x daily - 5 x weekly - 2 sets - 10 reps  Added walking program 60mns a day with widened BOS 4 days per week.   GOALS: Goals reviewed with patient? Yes   SHORT TERM GOALS: Target date:  06/25/2021   1. Pt will be independent with strength and balance HEP with supervision from family.               Baseline:  07/11/21: has program, not very consistent with performance of it             Goal status:  PARTIALLY MET      LONG TERM GOALS: Target date:  07/26/2021    Pt will be independent with strength and balance HEP with supervision from family. Baseline: 07/11/2021: has program, not very consistent with performance of it Goal status: NOT MET   2.  Pt will decrease 5xSTS to <13 seconds in order to demonstrate decreased risk for falls and improved functional bilateral LE strength and power. Baseline: 07/11/2021: 11.28 seconds no UE support from standard height surface;  Goal status: MET   3.  Pt will improve FGA score to 22/30 in order to demonstrate improved balance and decreased fall risk. Baseline: 07/11/21: 19/30 scored today; 07/24/2021 20/30 Goal status: NOT MET   4.  Pt will ambulate 500' w/o AD supervision level with no overt LOB or toe catching on various surfaces. Baseline: 07/11/21: 330 feet x 1 before needing a rest break with cane and min guard to min assist; 300'  [facility] w/ intermittent cuing 07/24/2021 Goal status: NOT MET  5. Pt will negotiate 15 6" stairs using single hand rail w/ reciprocal gait and no LOB. Baseline: bilateral rails reciprocal gait x4 steps, unable to  recheck on 07/11/21 due to time constraints; 16 steps             Goal status:  REVISED (07/18/2021); NOT MET   ASSESSMENT:   CLINICAL IMPRESSION: Patient met 1 of 5 long-term goals improving 5xSTS to 11.28 seconds without UE support.  He continues to be non-compliant with HEP with re-iteration of importance of self-management at home.  He prefers to manage stairs w/ bilateral rails or single rail and SPC on opposite side and appeared safe with use of SPC on stairs following minimal cuing for initial gait pattern.  He is ambulating consistently around 300' with AD prior to fatigue or notable imbalance, at this time he is safest with SProgressive Laser Surgical Institute Ltdor tripod cane.  His FGA improved from 19 to 20/30 upon reassessment today.  At this time pt is appropriate to discharge from outpatient services.     OBJECTIVE IMPAIRMENTS Abnormal gait, decreased activity tolerance, decreased balance, decreased cognition, decreased coordination, decreased endurance, decreased knowledge of condition, decreased knowledge of use of DME, difficulty walking, and decreased safety awareness.    ACTIVITY LIMITATIONS community activity and yard work.    PERSONAL FACTORS Age, Behavior pattern, Education, Past/current experiences, Social background, and Time since onset of injury/illness/exacerbation are also affecting patient's functional outcome.      REHAB POTENTIAL: Fair See PMH and personal factors   CLINICAL DECISION MAKING: Evolving/moderate complexity   EVALUATION COMPLEXITY: Moderate   PLAN: PT FREQUENCY: 1x/week   PT DURATION: 8 weeks   PLANNED INTERVENTIONS: Therapeutic exercises, Therapeutic activity, Neuromuscular re-education, Balance training, Gait training, Patient/Family education, and Joint mobilization   PLAN FOR NEXT SESSION:   N/A  MElease Etienne PT, DOrchard Lake Village[address] [place]  [phone] 07/25/21, 5:52 PM

## 2021-07-25 NOTE — Telephone Encounter (Signed)
Pt did not drop off forms, will contact pharmacy today to see if I can start this another route

## 2021-07-30 ENCOUNTER — Encounter: Payer: Self-pay | Admitting: Family Medicine

## 2021-07-30 DIAGNOSIS — M9904 Segmental and somatic dysfunction of sacral region: Secondary | ICD-10-CM | POA: Diagnosis not present

## 2021-07-30 DIAGNOSIS — M5135 Other intervertebral disc degeneration, thoracolumbar region: Secondary | ICD-10-CM | POA: Diagnosis not present

## 2021-07-30 DIAGNOSIS — M9903 Segmental and somatic dysfunction of lumbar region: Secondary | ICD-10-CM | POA: Diagnosis not present

## 2021-07-30 DIAGNOSIS — M9902 Segmental and somatic dysfunction of thoracic region: Secondary | ICD-10-CM | POA: Diagnosis not present

## 2021-07-30 DIAGNOSIS — M5137 Other intervertebral disc degeneration, lumbosacral region: Secondary | ICD-10-CM | POA: Diagnosis not present

## 2021-07-30 DIAGNOSIS — M5136 Other intervertebral disc degeneration, lumbar region: Secondary | ICD-10-CM | POA: Diagnosis not present

## 2021-08-01 ENCOUNTER — Ambulatory Visit: Payer: PPO | Admitting: Family Medicine

## 2021-08-06 ENCOUNTER — Other Ambulatory Visit: Payer: Self-pay | Admitting: Family Medicine

## 2021-08-06 ENCOUNTER — Telehealth: Payer: Self-pay | Admitting: Family Medicine

## 2021-08-06 DIAGNOSIS — G8929 Other chronic pain: Secondary | ICD-10-CM

## 2021-08-06 NOTE — Telephone Encounter (Signed)
Prior Auth submitted via CoverMyMeds will check back on status, this was submitted yesterday but is still pending

## 2021-08-06 NOTE — Telephone Encounter (Signed)
Pharmacy called stating that pt is requesting a 4 caps a day approval for Pregablin 150 mg.

## 2021-08-07 ENCOUNTER — Telehealth: Payer: Self-pay | Admitting: Family Medicine

## 2021-08-07 DIAGNOSIS — M5136 Other intervertebral disc degeneration, lumbar region: Secondary | ICD-10-CM | POA: Diagnosis not present

## 2021-08-07 DIAGNOSIS — M9902 Segmental and somatic dysfunction of thoracic region: Secondary | ICD-10-CM | POA: Diagnosis not present

## 2021-08-07 DIAGNOSIS — M5137 Other intervertebral disc degeneration, lumbosacral region: Secondary | ICD-10-CM | POA: Diagnosis not present

## 2021-08-07 DIAGNOSIS — M5135 Other intervertebral disc degeneration, thoracolumbar region: Secondary | ICD-10-CM | POA: Diagnosis not present

## 2021-08-07 DIAGNOSIS — G4733 Obstructive sleep apnea (adult) (pediatric): Secondary | ICD-10-CM | POA: Diagnosis not present

## 2021-08-07 DIAGNOSIS — M9903 Segmental and somatic dysfunction of lumbar region: Secondary | ICD-10-CM | POA: Diagnosis not present

## 2021-08-07 DIAGNOSIS — M9904 Segmental and somatic dysfunction of sacral region: Secondary | ICD-10-CM | POA: Diagnosis not present

## 2021-08-07 NOTE — Telephone Encounter (Signed)
Again this still states in process on CoverMyMed will have to call insurance when time available

## 2021-08-07 NOTE — Telephone Encounter (Signed)
error 

## 2021-08-07 NOTE — Progress Notes (Signed)
Chronic Care Management Pharmacy Note  08/21/2021 Name:  Daniel [name] MRN:  [id number] DOB:  Jul 22, 1955  Summary: Pharmd follow up.  Patient started Repatha and tolerating it well.  A1c has improved to 7.0. He is requesting Lyrica to be called in as insurance will pay for 334m #60 instead of previously doubling up on the 1557m Did confirm it is available in this dosage.   Recommendations: Continue with lipid clinic, visit in July for lipids  FU 6 months PharmD 3 months CMA check in  Subjective: Daniel [name] an [age].o. year old male who is a primary patient of GrWendie AgresteMD.  The CCM team was consulted for assistance with disease management and care coordination needs.    Engaged with patient by telephone for follow up visit in response to provider referral for pharmacy case management and/or care coordination services.   Consent to Services:  The patient was given the following information about Chronic Care Management services today, agreed to services, and gave verbal consent: 1. CCM service includes personalized support from designated clinical staff supervised by the primary care provider, including individualized plan of care and coordination with other care providers 2. 24/7 contact phone numbers for assistance for urgent and routine care needs. 3. Service will only be billed when office clinical staff spend 20 minutes or more in a month to coordinate care. 4. Only one practitioner may furnish and bill the service in a calendar month. 5.The patient may stop CCM services at any time (effective at the end of the month) by phone call to the office staff. 6. The patient will be responsible for cost sharing (co-pay) of up to 20% of the service fee (after annual deductible is met). Patient agreed to services and consent obtained.  Patient Care Team: GrWendie AgresteMD as PCP - General (Family Medicine) Comer, RoOkey RegalMD as PCP - Infectious Diseases (Infectious  Diseases) RaSkeet LatchMD as PCP - Cardiology (Cardiology) EdLaurence SpatesMD (Inactive) as PCP - Gastroenterology (Gastroenterology) StLandis MartinsDPM as Consulting Physician (Podiatry) ElRenato ShinMD (Inactive) as Consulting Physician (Endocrinology) SuBettina GaviaOD (Optometry) BeLorretta HarpMD as Consulting Physician (Cardiology) KaChucky MayMD as Consulting Physician (Psychiatry) GoCorliss ParishMD as Consulting Physician (Nephrology) MuRenette ButtersMD as Attending Physician (Orthopedic Surgery) YaMarcial PacasMD as Consulting Physician (Neurology) Dohmeier, CaAsencion PartridgeMD as Consulting Physician (Neurology) ShRutherford GuysMD as Consulting Physician (Ophthalmology) DaEdythe ClarityRPAver[facility] ClinicPharmacist)  Recent office visits:  04/10/21 JeMerri RayMD - Family Medicine - Chest Wall pain - Fall prevention discussed. Follow up as scheduled.    Recent consult visits:  04/25/21 - Evelene CroonPharmD - Started on ReStratfordPAUtahompleted.  Was supposed to come back for fasting labs after the 4th dose.  Does not look like those had been completed yet.   04/11/21 SeRenato ShinMD - Internal Medicine - Diabetes - Labs were ordered. Referral placed for diabetic education. No medication changes. Follow up in 2 months.    04/09/21 RoVance GatherMD - Infectious Disease - HIV - Labs were ordered. No medication changes. Follow up in 1 year.    Hospital visits:  None in previous 6 months  Objective:  Lab Results  Component Value Date   CREATININE 1.68 (H) 03/26/2021   BUN 28 (H) 03/26/2021   GFR 46.66 (L) 05/07/2020   GFRNONAA 46 (L) 01/04/2020   GFRAA 53 (L) 01/04/2020   NA 137 03/26/2021   K  4.0 03/26/2021   CALCIUM 9.7 03/26/2021   CO2 25 03/26/2021   GLUCOSE 135 (H) 03/26/2021    Lab Results  Component Value Date/Time   HGBA1C 7.0 (A) 04/11/2021 02:08 PM   HGBA1C 7.2 (A) 01/09/2021 04:12 PM   HGBA1C 8.7 (H) 06/28/2016 12:00 AM   HGBA1C 7.2 (H) 06/28/2015  08:48 AM   FRUCTOSAMINE 252 05/07/2020 04:37 PM   GFR 46.66 (L) 05/07/2020 04:37 PM   MICROALBUR 19.5 (H) 12/05/2020 03:58 PM   MICROALBUR 107.5 (H) 08/01/2014 01:29 PM    Last diabetic Eye exam:  Lab Results  Component Value Date/Time   HMDIABEYEEXA No Retinopathy 02/06/2020 12:00 AM    Last diabetic Foot exam: No results found for: "HMDIABFOOTEX"   Lab Results  Component Value Date   CHOL 215 (H) 03/26/2021   HDL 39 (L) 03/26/2021   LDLCALC 136 (H) 03/26/2021   TRIG 222 (H) 03/26/2021   CHOLHDL 5.5 (H) 03/26/2021       Latest Ref Rng & Units 03/26/2021   10:50 AM 09/15/2019    4:15 AM 09/14/2019    3:54 PM  Hepatic Function  Total Protein 6.0 - 8.5 g/dL 7.0  6.7  7.2   Albumin 3.8 - 4.8 g/dL 4.6  3.5  3.7   AST 0 - 40 IU/L _0 ALT 0 - 44 IU/L 43  24  25   Alk Phosphatase 44 - 121 IU/L 121  78  85   Total Bilirubin 0.0 - 1.2 mg/dL 0.6  0.7  0.7     Lab Results  Component Value Date/Time   TSH 0.47 05/07/2020 04:37 PM   TSH 0.108 (L) 09/14/2019 03:54 PM   TSH 0.736 09/17/2016 09:57 AM   FREET4 0.78 05/07/2020 04:37 PM   FREET4 1.04 09/15/2019 04:15 AM       Latest Ref Rng & Units 09/17/2020   11:40 AM 09/15/2019    4:15 AM 09/14/2019    3:54 PM  CBC  WBC 4.0 - 10.5 K/uL 7.2  6.0  6.0   Hemoglobin 13.0 - 17.0 g/dL 17.2  15.9  16.0   Hematocrit 39.0 - 52.0 % 48.2  46.8  46.1   Platelets 150.0 - 400.0 K/uL 146.0  125  130     Lab Results  Component Value Date/Time   VD25OH 23 (L) 02/12/2018 02:40 PM    Clinical ASCVD: Yes  The 10-year ASCVD risk score (Arnett DK, et al., 2019) is: 30.9%   Values used to calculate the score:     Age: 66 years     Sex: Male     Is Non-Hispanic African American: No     Diabetic: Yes     Tobacco smoker: No     Systolic Blood Pressure: 283 mmHg     Is BP treated: No     HDL Cholesterol: 39 mg/dL     Total Cholesterol: 215 mg/dL       08/12/2021    3:24 PM 07/01/2021    2:57 PM 04/09/2021    4:31 PM  Depression  screen PHQ 2/9  Decreased Interest 3 0 1  Down, Depressed, Hopeless 2 0 1  PHQ - 2 Score 5 0 2  Altered sleeping 3  2  Tired, decreased energy 1  2  Change in appetite 1  0  Feeling bad or failure about yourself  1  1  Trouble concentrating 1  1  Moving slowly or fidgety/restless 0  0  Suicidal thoughts 0  0  PHQ-9 Score 12  8     Social History   Tobacco Use  Smoking Status Former   Packs/day: 0.10   Years: 10.00   Total pack years: 1.00   Types: Cigars, Cigarettes   Quit date: 08/09/2014   Years since quitting: 7.0  Smokeless Tobacco Never   BP Readings from Last 3 Encounters:  08/12/21 128/78  07/21/21 (!) 153/83  07/15/21 126/70   Pulse Readings from Last 3 Encounters:  08/12/21 90  07/21/21 93  07/15/21 86   Wt Readings from Last 3 Encounters:  08/12/21 241 lb (109.3 kg)  07/15/21 255 lb (115.7 kg)  07/01/21 252 lb 3.2 oz (114.4 kg)   BMI Readings from Last 3 Encounters:  08/12/21 34.58 kg/m  07/15/21 36.59 kg/m  07/01/21 36.19 kg/m    Assessment/Interventions: Review of patient past medical history, allergies, medications, health status, including review of consultants reports, laboratory and other test data, was performed as part of comprehensive evaluation and provision of chronic care management services.   SDOH:  (Social Determinants of Health) assessments and interventions performed: Yes  Financial Resource Strain: Medium Risk (02/20/2021)   Overall Financial Resource Strain (CARDIA)    Difficulty of Paying Living Expenses: Somewhat hard    SDOH Screenings   Alcohol Screen: Low Risk  (04/21/2019)   Alcohol Screen    Last Alcohol Screening Score (AUDIT): 0  Depression (PHQ2-9): Medium Risk (08/12/2021)   Depression (PHQ2-9)    PHQ-2 Score: 12  Financial Resource Strain: Medium Risk (02/20/2021)   Overall Financial Resource Strain (CARDIA)    Difficulty of Paying Living Expenses: Somewhat hard  Food Insecurity: No Food Insecurity (10/01/2017)    Hunger Vital Sign    Worried About Running Out of Food in the Last Year: Never true    Ran Out of Food in the Last Year: Never true  Housing: Not on file  Physical Activity: Inactive (10/01/2017)   Exercise Vital Sign    Days of Exercise per Week: 0 days    Minutes of Exercise per Session: 0 min  Social Connections: Moderately Isolated (10/01/2017)   Social Connection and Isolation Panel [NHANES]    Frequency of Communication with Friends and Family: More than three times a week    Frequency of Social Gatherings with Friends and Family: Once a week    Attends Religious Services: Never    Marine scientist or Organizations: No    Attends Archivist Meetings: Not asked    Marital Status: Never married  Stress: No Stress Concern Present (10/01/2017)   [employer]    Feeling of Stress : Only a little  Tobacco Use: Medium Risk (08/13/2021)   Patient History    Smoking Tobacco Use: Former    Smokeless Tobacco Use: Never    Passive Exposure: Not on file  Transportation Needs: No Transportation Needs (10/01/2017)   PRAPARE - Hydrologist (Medical): No    Lack of Transportation (Non-Medical): No    CCM Care Plan  Allergies  Allergen Reactions   Aspirin Swelling   Efavirenz Swelling and Rash    Other reaction(s): anaphylaxis   Ibuprofen Swelling   Nsaids Other (See Comments)    unknwn   Pravastatin     myalgias   Lipitor [Atorvastatin Calcium] Other (See Comments)    Leg pain    Medications Reviewed Today     Reviewed by Rosana Hoes,  Jerilynn Mages, [facility] (Pharmacist) on 08/21/21 at 1024  Med List Status: <None>   Medication Order Taking? Sig Documenting Provider Last Dose Status Informant  acetaminophen (TYLENOL) 500 MG tablet [id number] Yes Take 1,500 mg by mouth in the morning and at bedtime. [provider] Taking Active Self           Med Note Trinidad Curet, DANA K   Wed Sep 14, 2019  8:16 PM)    ALPRAZolam Duanne Moron) 1 MG tablet [id number] Yes Take 1 mg by mouth at bedtime. *May take one tablet up to 4 times daily as needed for anxiety [provider] Taking Active Self  amLODipine (NORVASC) 5 MG tablet [id number]  Take 1 tablet (5 mg total) by mouth daily. [name] E, PA-C  Expired 05/08/21 2359   amphetamine-dextroamphetamine (ADDERALL) 30 MG tablet [id number] Yes Take 30 mg by mouth 3 (three) times daily. [provider] Taking Active Self           Med Note Rosana Hoes, Ashla Murph L   Wed Feb 20, 2021  3:16 PM) Patient 72m per day  B-D ULTRAFINE III SHORT PEN 31G X 8 MM MISC [id number] USE AS DIRECTED THREE TIMES DAILY ERenato Shin MD Taking Active   brexpiprazole (REXULTI) 1 MG TABS tablet [id number] Take 1 mg by mouth at bedtime. [provider] Taking Active Self  Continuous Blood Gluc Receiver (FREESTYLE LIBRE 2 READER) DEVI [id number] Use as instructed to check blood sugars. ERenato Shin MD Taking Active   dapagliflozin propanediol (FARXIGA) 10 MG TABS tablet [id number] Take 1 tablet (10 mg total) by mouth daily before breakfast. ERenato Shin MD Taking Active   diclofenac Sodium (VOLTAREN) 1 % GEL [id number] APPLY 2 GM TO EACH KNEE EVERY MORNING AND EVERY NIGHT AT BEDTIME, AND APPLY 1 GM TO EACH KNEE EVERY AFTERNOON GWendie Agreste MD Taking Active   divalproex (DEPAKOTE ER) 500 MG 24 hr tablet [id number] TAKE 1 TABLET(500 MG) BY MOUTH AT BEDTIME GWendie Agreste MD Taking Active   Dulaglutide (TRULICITY) 4.5 MTI/1.4ERSOPN [id number] Inject 4.5 mg as directed once a week. ERenato Shin MD Taking Active   Evolocumab ([facility] IiiSURECLICK) 1195MG/ML SDarden [id number] Inject 140 mg into the skin every 14 (fourteen) days. RSkeet Latch MD Taking Active   ezetimibe (ZETIA) 10 MG tablet [id number] TAKE 1 TABLET(10 MG) BY MOUTH DAILY RSkeet Latch MD Taking Active   furosemide (LASIX) 40 MG tablet [id number]  Take 1 tablet (40 mg total) by mouth daily. GSande RivesE, PA-C  Expired 05/08/21 2359   insulin aspart (NOVOLOG FLEXPEN) 100 UNIT/ML FlexPen [id number] 15 units with breakfast and 25 units with supper.  And pen needles 3/day ERenato Shin MD Taking Active   insulin glargine (LANTUS SOLOSTAR) 100 UNIT/ML Solostar Pen [id number] Inject 100 Units into the skin every morning. ERenato Shin MD Taking Active   levothyroxine (SYNTHROID, LEVOTHROID) 50 MCG tablet [id number] Take 1 tablet (50 mcg total) by mouth at bedtime. DDarlyne Russian MD Taking Active Self  ondansetron (ZOFRAN) 8 MG tablet [id number] TAKE 1 TABLET BY MOUTH EVERY 8 HOURS AS NEEDED FOR NAUSEA OR VOMITING GWendie Agreste MD Taking Active   pantoprazole (PROTONIX) 40 MG tablet [id number] TAKE 1 TABLET(40 MG) BY MOUTH DAILY GWendie Agreste MD Taking Active   pregabalin (LYRICA) 150 MG capsule [id number] Take 1 capsule (150 mg total) by mouth 3 (three) times daily. GMerri Ray  R, MD Taking Active   protriptyline (VIVACTIL) 10 MG tablet [id number] Yes Take 10 mg by mouth 3 (three) times daily.  [provider] Taking Active Self           Med Note Luana Shu, NATASHA   Tue Jan 20, 2017  3:28 PM)    silodosin (RAPAFLO) 8 MG CAPS capsule [id number] Yes Take 8 mg by mouth daily. [provider] Taking Active   TRINTELLIX 20 MG TABS tablet [id number] Yes Take 20 mg by mouth at bedtime.  [provider] Taking Active Self           Med Note Geroge Baseman   Fri Feb 09, 2015  3:31 PM)    Perlie Gold 600-50-300 MG tablet [id number] Yes TAKE 1 TABLET BY MOUTH DAILY Comer, Okey Regal, MD Taking Active   XARELTO 20 MG TABS tablet [id number] Yes TAKE 1 TABLET(20 MG) BY MOUTH DAILY WITH SUPPER [name], MD Taking Active   zolpidem (AMBIEN) 10 MG tablet [id number] Yes Take 10 mg by mouth at bedtime. [provider] Taking Active Self            Patient Active Problem List   Diagnosis  Date Noted   Insomnia due to mental condition 05/23/2021   Atherosclerosis of native artery of extremity ([place]) 04/25/2021   Chronic nonalcoholic liver disease 53/97/6734   History of hepatitis B 04/25/2021   HIV positive ([place]) 04/25/2021   Long term (current) use of insulin ([place]) 04/25/2021   Long term (current) use of anticoagulants 19/37/9024   Nonalcoholic steatohepatitis ([place]) 04/25/2021   Type 2 diabetes mellitus with other circulatory complications ([place]) 09/73/5329   Type 2 diabetes mellitus with peripheral angiopathy ([place]) 04/25/2021   Abnormal liver function tests 04/25/2021   Statin myopathy 02/12/2021   Unilateral primary osteoarthritis, right knee 12/15/2019   Generalized weakness 09/14/2019   Severe major depression ([place]) 11/17/2018   Class 2 obesity due to excess calories without serious comorbidity with body mass index (BMI) of 36.0 to 36.9 in adult 11/17/2018   Anticoagulated 08/24/2018   Dyslipidemia 07/27/2018   Essential hypertension 06/25/2018   Hypogonadism in male 05/10/2018   Numbness 03/31/2018   Diabetic peripheral neuropathy ([place]) 01/25/2018   S/P transmetatarsal amputation of foot, right ([place]) 10/09/2017   Hypothyroidism    Constipation    Obesity, Class III, BMI 40-49.9 (morbid obesity) ([place]) 01/15/2017   DOE (dyspnea on exertion) 01/13/2017   Imbalance 11/18/2016   Osteomyelitis of right foot ([place]) 09/22/2016   Chronic migraine 09/17/2016   Gait abnormality 09/17/2016   Fall 12/05/2015   Toe ulcer, right ([place]) 09/19/2015   Decreased pedal pulses 09/19/2015   OSA on CPAP 09/05/2015   Major depressive disorder, recurrent episode, moderate ([place]) 09/05/2015   Recurrent falls while walking 06/24/2015   H/O migraine 06/22/2015   Sinus headache 06/21/2015   OSA (obstructive sleep apnea) 06/20/2015   History of DVT (deep vein thrombosis) 06/20/2015   History of falling 06/20/2015   Chronic, continuous use of opioids 06/20/2015   Coagulopathy ([place])  06/20/2015   Elevated CPK 06/20/2015   Pain syndrome, chronic 06/20/2015   Thrombocythemia 06/20/2015   Uncontrolled type 2 diabetes mellitus with hyperglycemia ([place]) 06/20/2015   Low back pain 06/11/2015   Unspecified abnormalities of gait and mobility 06/11/2015   Benign paroxysmal positional vertigo of right ear 05/01/2015   Chronic kidney disease, stage III (moderate) ([place]) 08/09/2014   Renal insufficiency 08/09/2014   Insulin-requiring or dependent type II diabetes mellitus ([place])  11/07/2013   Diabetes mellitus ([place]) 11/07/2013   Steatosis of liver 09/09/2010   Human immunodeficiency virus (HIV) infection ([place]) 06/04/2006   Herpes zoster 06/04/2006   Depression 06/04/2006   THROMBOPHLEBITIS NOS 06/04/2006   Gastroesophageal reflux disease 06/04/2006   ARTHRITIS, HAND 06/04/2006   Attention deficit hyperactivity disorder 06/04/2006   Abnormal blood chemistry level 06/04/2006    Immunization History  Administered Date(s) Administered   Fluad Quad(high Dose 65+) 11/17/2018, 10/26/2019, 12/05/2020   H1N1 03/09/2008   Hepatitis A 09/09/2010, 05/29/2011   Influenza Split 12/06/2010, 12/04/2011   Influenza Whole 01/26/2004, 12/25/2005, 01/14/2007, 12/22/2007, 11/24/2009   Influenza,inj,Quad PF,6+ Mos 11/04/2012, 11/07/2013, 01/10/2015, 11/12/2015, 11/27/2016, 11/16/2017   Janssen (J&J) SARS-COV-2 Vaccination 07/04/2019   Moderna Sars-Covid-2 Vaccination 01/27/2020   Pneumococcal Conjugate-13 09/03/2006, 11/17/2018   Pneumococcal Polysaccharide-23 03/12/2000, 05/29/2011, 04/29/2012   Tdap 08/01/2014   Zoster Recombinat (Shingrix) 12/10/2016, 02/18/2017   Zoster, Live 11/08/2015    Conditions to be addressed/monitored:  HTN, GERD, Hypothyroidism, Neuropathy, MDD, HLD, Type II DM  Care Plan : General Pharmacy (Adult)  Updates made by Edythe Clarity, RPH since 08/21/2021 12:00 AM     Problem: HTN, GERD, Hypothyroidism, Neuropathy, MDD, HLD, Type II DM   Priority: High   Onset Date: 02/20/2021     Long-Range Goal: Patient-Specific Goal   Start Date: 02/20/2021  Expected End Date: 08/21/2021  Recent Progress: On track  Priority: High  Note:   Current Barriers:  Elevated lipids  Pharmacist Clinical Goal(s):  Patient will achieve improvement in LDL as evidenced by follow up labs through collaboration with PharmD and provider.   Interventions: 1:1 collaboration with [name], MD regarding development and update of comprehensive plan of care as evidenced by provider attestation and co-signature Inter-disciplinary care team collaboration (see longitudinal plan of care) Comprehensive medication review performed; medication list updated in electronic medical record  Hypertension (BP goal <130/80) Controlled, not assessed -Uncontrolled -Current treatment: Amlodipine 17m daily -Medications previously tried: losartan  -Current home readings: 167/60-70 -Current dietary habits: high sodium intake, high carb and sugar intake - see DM for specifics -Denies hypotensive/hypertensive symptoms -Educated on BP goals and benefits of medications for prevention of heart attack, stroke and kidney damage; Exercise goal of 150 minutes per week; Importance of home blood pressure monitoring; -Counseled to monitor BP at home a few times per week, document, and provide log at future appointments -Recommended to continue current medication Previous history of losartan with unknown d/c. Recent elevated BP in office as well as at home.  Already having some fluid retention with swelling noted in feet - would be hesitant to increase amlodipine for this reason.  CMA to follow up on BP in 30 days and we will assess at that time before adding new agents.    Hyperlipidemia: (LDL goal < 70) 08/21/21 -Uncontrolled, recently started Repatha about 3 months ago via lipid clinic -Current treatment: Zetia 157mdaily Appropriate, Query effective, Repatha 140 mg into the skin  every other week Appropriate, Query effective, -Medications previously tried: atorvastatin (leg pain)  -Current dietary patterns: unchanged see previous -Current exercise habits: minimal  -Educated on Cholesterol goals;  Importance of limiting foods high in cholesterol; Benefits of Repatha, importance of adherence.  It is not costing him anything so cost will not be a barrier for him. -Recommended to continue current medication Continue to follow with lipid clinic. He has FU visit scheduled there in August, would recommend lipid panel to determine efficacy of Repatha. Encouraged him to continue to  he adherent, eager to see results of next lipid panel.   Update 04/25/21 LDL elevated ASCVD risk 24.3% - high risk.  He cannot tolerate statins in the past.  He missed his scheduled appointment with lipid clinic with cardiologist.  He needs to have some kind of more advanced treatment for lipids to reduce CV risk.  Rescheduled visit with lipid clinic.  Would recommend Repatha or Praluent if patient is agreeable. Will halep with patient assistance if needed. FU on visit next month to determine if further intervention is needed. Continue to work on lifestyle mods.    Diabetes (A1c goal <7%) 08/21/21 -Controlled, based on most recent A1c of 7.0 -Current medications: Trulicity 4.5 DB/5.2CE Appropriate, Query effective, ,  Lantus 100 units every morning Appropriate, Effective, Novolog 15 units with breakfast and 25 units with supper Appropriate, Effective, Safe, Accessible Farxiga 54m daily Appropriate, Effective, Safe, Accessible Lyrica 1560m2 capsules bid Appropriate, Effective, Safe, Accessible -Medications previously tried: metformin  -Current home glucose readings fasting glucose: has not had Freestyle lately so not checking, plans to refill after our visit today  -Denies hypoglycemic/hyperglycemic symptoms -Educated on A1c and blood sugar goals; Prevention and management of hypoglycemic  episodes; Benefits of routine self-monitoring of blood sugar; Counseled to check feet daily and get yearly eye exams -He is due for an eye exam, will have [facility] -Recommended to continue current medication Now seeing Dr. GhCruzita Lederert Endocrine, has FU visit next month for A1c.  Update 04/24/21 165, 199, 212 are random glucose readings given to me today. A1c improved to 7.0 - he denies any hypoglycemia. He continues to work on diet cutting back on gatorades, ice cream floats.  He does report he drinks a substantial amount of milk - we discussed the carbohydrate and sugar content in this and he is going to try and cut back on milk.  Would caution on over basalization with Lantus insulin over 0.23m11mg.  We could increase the amount of meal time insulin based on meals and cut back on Lantus if he is experiencing episodes of hypoglycemia with controlled or elevated A1c. FU next month on glucose readings and diet changes with CMA.   Depression/Anxiety (Goal: Minimize symptoms) -Controlled, not assessed today -Current treatment: Trintellix 76m28mily Trintellix 23mg 1mly Rexulti 1mg d64my Alprazolam 1mg pr12mivactil 10mg ti76medications previously tried/failed: Pristiq -PHQ9:  PHQ9 SCORE ONLY 02/04/2021 12/27/2020 12/05/2020  PHQ-9 Total Score _0 -Sees Dr. RupinderChucky Mayer year for follow up. -Educated on Benefits of medication for symptom control -Recommended to continue current medication Would hesitate to make changes as he is seen by psychology - of note, Trintellix maximum dose is 76mg per223m - will consult with Dr. Kaur to mToy Caresure she is aware of this dose.  GERD (Goal: Minimize symptoms) -Controlled, not assessed today -Current treatment  Pantoprazole 40mg dail623medications previously tried: none noted -Discussed appropriate timing of medication and difference between other options such as tums and Pepcid.   -Recommended to continue current medication Work on trigger foods and medication timing.  Hypothyroidism (Goal: Maintain TSH) -Controlled, not assessed today -Current treatment  Levothyroxine 50mcg dail66medications previously tried: none noted -Most recent TSH was WNL - he takes this medication in the evening usually with milk.  -Recommended to continue current medication Counseled on appropriate timing of this medication, would hesitate to make big changes with TSH normal.  However if  it continues to fluctuate he may need to make changes on medication timing to ensure adequate and consistent absorption.  HIV  -Controlled, not assessed today -Current treatment  Triumeq 600-50-300 daily -Medications previously tried: Stribild, Jorje Guild, Atripla  -Recommended to continue current medication Assessed patient finances. He gets through specialty [facility] in [place] with no copay. Continue as prescribed.  Patient Goals/Self-Care Activities Patient will:  - take medications as prescribed as evidenced by patient report and record review check blood pressure a few times per week, document, and provide at future appointments engage in dietary modifications by limiting gatorades to one per day and work on cutting back on servings of ice cream  Follow Up Plan: The care management team will reach out to the patient again over the next 30 days.             Medication Assistance: None required.  Patient affirms current coverage meets needs.  Compliance/Adherence/Medication fill history: Care Gaps: Diabetic eye exam  Star-Rating Drugs: Dulaglutide (TRULICITY) 4.5 OI/8.1GS SOPN - last filled 12/21/2020 84 days   Patient's preferred pharmacy is:  [facility] [place], [place] [address] Phone: [phone] Fax: [place], [place] - [name] [place] [address] [name]  [name] [place] [address] Phone: [phone] Fax: [phone]  Uses pill box? No - has own organization methods in a box Pt endorses 100% compliance  We discussed: Benefits of medication synchronization, packaging and delivery as well as enhanced pharmacist oversight with [employer]. Patient decided to: Continue current medication management strategy  Care Plan and Follow Up Patient Decision:  Patient agrees to Care Plan and Follow-up.  Plan: The care management team will reach out to the patient again over the next 30 days.  Beverly Milch, PharmD Clinical Pharmacist  [facility] [phone]

## 2021-08-07 NOTE — Telephone Encounter (Signed)
Insurance called back and provided [phone] to initiate PA on pregabalin as the CoverMyMeds is not going through to their system

## 2021-08-07 NOTE — Telephone Encounter (Signed)
Daniel [name] called wants to know what the update on [name] approval for Pregablin 150 mg.

## 2021-08-07 NOTE — Telephone Encounter (Signed)
Called and spoke to insurance at that number, a claims expert is sending me forms to finish filling the appeal and then once faxed back should not take long to be completed

## 2021-08-08 ENCOUNTER — Telehealth: Payer: Self-pay

## 2021-08-08 NOTE — Telephone Encounter (Signed)
Completed forms and given to [facility] for fax so this could be completed faster

## 2021-08-09 ENCOUNTER — Ambulatory Visit (HOSPITAL_BASED_OUTPATIENT_CLINIC_OR_DEPARTMENT_OTHER): Payer: PPO | Admitting: Cardiovascular Disease

## 2021-08-12 ENCOUNTER — Ambulatory Visit (INDEPENDENT_AMBULATORY_CARE_PROVIDER_SITE_OTHER): Payer: PPO | Admitting: Family Medicine

## 2021-08-12 VITALS — BP 128/78 | HR 90 | Temp 98.1°F | Resp 16 | Ht 70.0 in | Wt 241.0 lb

## 2021-08-12 DIAGNOSIS — M792 Neuralgia and neuritis, unspecified: Secondary | ICD-10-CM

## 2021-08-12 DIAGNOSIS — Z9181 History of falling: Secondary | ICD-10-CM

## 2021-08-12 DIAGNOSIS — E1142 Type 2 diabetes mellitus with diabetic polyneuropathy: Secondary | ICD-10-CM | POA: Diagnosis not present

## 2021-08-12 MED ORDER — PREGABALIN 150 MG PO CAPS: 150.0000 mg | ORAL_CAPSULE | Freq: Three times a day (TID) | ORAL | 0 refills | Status: DC

## 2021-08-12 NOTE — Patient Instructions (Addendum)
Pregabalin ordered for 3 times per day for now as that appears to be covered. We do have an appeal pending to see if we can get the 375m 2 times per day dosing covered.   I will refer you to Occupational Therapy to see if other recommendations to limit falling.  I do recommend a cane at minimum, but walker may be more helpful - let me know if you change your mind and I will send in a prescription. Depending on the OT recommendations, I may ask neuro to see you again as well.   Return to the clinic or go to the nearest emergency room if any of your symptoms worsen or new symptoms occur.  Take care.   Fall Prevention in the Home, Adult Falls can cause injuries and affect people of all ages. There are many simple things that you can do to make your home safe and to help prevent falls. Ask for help when making these changes, if needed. What actions can I take to prevent falls? General instructions Use good lighting in all rooms. Replace any light bulbs that burn out, turn on lights if it is dark, and use night-lights. Place frequently used items in easy-to-reach places. Lower the shelves around your home if necessary. Set up furniture so that there are clear paths around it. Avoid moving your furniture around. Remove throw rugs and other tripping hazards from the floor. Avoid walking on wet floors. Fix any uneven floor surfaces. Add color or contrast paint or tape to grab bars and handrails in your home. Place contrasting color strips on the first and last steps of staircases. When you use a stepladder, make sure that it is completely opened and that the sides and supports are firmly locked. Have someone hold the ladder while you are using it. Do not climb a closed stepladder. Know where your pets are when moving through your home. What can I do in the bathroom?     Keep the floor dry. Immediately clean up any water that is on the floor. Remove soap buildup in the tub or shower  regularly. Use nonskid mats or decals on the floor of the tub or shower. Attach bath mats securely with double-sided, nonslip rug tape. If you need to sit down while you are in the shower, use a plastic, nonslip stool. Install grab bars by the toilet and in the tub and shower. Do not use towel bars as grab bars. What can I do in the bedroom? Make sure that a bedside light is easy to reach. Do not use oversized bedding that reaches the floor. Have a firm chair that has side arms to use for getting dressed. What can I do in the kitchen? Clean up any spills right away. If you need to reach for something above you, use a sturdy step stool that has a grab bar. Keep electrical cables out of the way. Do not use floor polish or wax that makes floors slippery. If you must use wax, make sure that it is non-skid floor wax. What can I do with my stairs? Do not leave any items on the stairs. Make sure that you have a light switch at the top and the bottom of the stairs. Have them installed if you do not have them. Make sure that there are handrails on both sides of the stairs. Fix handrails that are broken or loose. Make sure that handrails are as long as the staircases. Install non-slip stair treads on all stairs  in your home. Avoid having throw rugs at the top or bottom of stairs, or secure the rugs with carpet tape to prevent them from moving. Choose a carpet design that does not hide the edge of steps on the stairs. Check any carpeting to make sure that it is firmly attached to the stairs. Fix any carpet that is loose or worn. What can I do on the outside of my home? Use bright outdoor lighting. Regularly repair the edges of walkways and driveways and fix any cracks. Remove high doorway thresholds. Trim any shrubbery on the main path into your home. Regularly check that handrails are securely fastened and in good repair. Both sides of all steps should have handrails. Install guardrails along the  edges of any raised decks or porches. Clear walkways of debris and clutter, including tools and rocks. Have leaves, snow, and ice cleared regularly. Use sand or salt on walkways during winter months. In the garage, clean up any spills right away, including grease or oil spills. What other actions can I take? Wear closed-toe shoes that fit well and support your feet. Wear shoes that have rubber soles or low heels. Use mobility aids as needed, such as canes, walkers, scooters, and crutches. Review your medicines with your health care provider. Some medicines can cause dizziness or changes in blood pressure, which increase your risk of falling. Talk with your health care provider about other ways that you can decrease your risk of falls. This may include working with a physical therapist or trainer to improve your strength, balance, and endurance. Where to find more information Centers for Disease Control and Prevention, [employer]: http://www.wolf.info/ National Institute on Aging: http://kim-miller.com/ Contact a health care provider if: You are afraid of falling at home. You feel weak, drowsy, or dizzy at home. You fall at home. Summary There are many simple things that you can do to make your home safe and to help prevent falls. Ways to make your home safe include removing tripping hazards and installing grab bars in the bathroom. Ask for help when making these changes in your home. This information is not intended to replace advice given to you by your health care provider. Make sure you discuss any questions you have with your health care provider. Document Revised: 11/12/2020 Document Reviewed: 09/14/2019 Elsevier Patient Education  Daniel [name].    It is important to avoid accidents which may result in broken bones.  Here are a few ideas on how to make your home safer so you will be less likely to trip or fall.  Use nonskid mats or non slip strips in your shower or tub, on your bathroom floor and  around sinks.  If you know that you have spilled water, wipe it up! In the bathroom, it is important to have properly installed grab bars on the walls or on the edge of the tub.  Towel racks are NOT strong enough for you to hold onto or to pull on for support. Stairs and hallways should have enough light.  Add lamps or night lights if you need ore light. It is good to have handrails on both sides of the stairs if possible.  Always fix broken handrails right away. It is important to see the edges of steps.  Paint the edges of outdoor steps white so you can see them better.  Put colored tape on the edge of inside steps. Throw-rugs are dangerous because they can slide.  Removing the rugs is the best idea, but  if they must stay, add adhesive carpet tape to prevent slipping. Do not keep things on stairs or in the halls.  Remove small furniture that blocks the halls as it may cause you to trip.  Keep telephone and electrical cords out of the way where you walk. Always were sturdy, rubber-soled shoes for good support.  Never wear just socks, especially on the stairs.  Socks may cause you to slip or fall.  Do not wear full-length housecoats as you can easily trip on the bottom.  Place the things you use the most on the shelves that are the easiest to reach.  If you use a stepstool, make sure it is in good condition.  If you feel unsteady, DO NOT climb, ask for help. If a health professional advises you to use a cane or walker, do not be ashamed.  These items can keep you from falling and breaking your bones.

## 2021-08-12 NOTE — Progress Notes (Unsigned)
Subjective:  Patient ID: Daniel [name], male    DOB: [birth year]  Age: 66 y.o. MRN: [id number]  CC:  Chief Complaint  Patient presents with   Fall    Pt here for 1 month check in about neuropathy that has been causing falls. Pt reports at least 2 maybe 3 falls in the last month, tripped over something in his house while it was dark   Depression    PHQ-9=12    Diabetes    Discuss pregabalin, pt would like 3 daily as he states this is better than nothing     HPI Daniel [name] presents for   History of falls, dizziness See prior visits, last last discussed May 8.  Likely multifactorial.,  But mechanical falls.  Risk factors with sedating medications, peripheral neuropathy.  Recommended assistive device, as well as discussion of rollator or walker with his physical therapist.  OT referral option given as well.  Reports 2-3 falls in the past month.  Last one few weeks ago - tripped on something at home, had not turned on light.  Was using cane, not using walker - in back of storage building - refuses rx for walker at this time. PT did not think Rollator was a good idea. Denies getting off balance.  Other time bent down to pick something up. Fell backward with standing up - kept going back.  Falls in split second. Denies syncope/near- syncope. Denies leg weakness, no preceding CP or palpitations.  PT dismissed him. Was not doing exercises at home. Did not want to do exercises. Not falling like he used to. Still feels like legs are weak.  Agrees to OT eval.   Peripheral neuropathy Has been managed with Lyrica 300 mg twice daily.  Recently has had some difficulty getting his medication covered, submitted prior authorization through cover my meds.  It appears immunization management rules were not met, plan covers 150 mg 90 capsules per 30 days. Appeal has been sent last week.  Dosage was increased from 150 mg 3 times daily in 2021 due to insufficient treatment of burning sensation in feet,  improved at the 300 mg twice daily dosing when discussed in March 2021.  Out of Lyrica past month? Controlled substance database (PDMP) reviewed. No concerns appreciated. #90 Lyrica 175m filled 5/5.    Positive depression screening followed by psychiatry.  No suicidal ideation.     08/12/2021    3:24 PM 07/01/2021    2:57 PM 04/09/2021    4:31 PM 02/04/2021    2:43 PM 12/27/2020    1:34 PM  Depression screen PHQ 2/9  Decreased Interest 3 0 1 3 2   Down, Depressed, Hopeless 2 0 1 2 1   PHQ - 2 Score 5 0 2 5 3   Altered sleeping 3  2 1 2   Tired, decreased energy 1  2 2 2   Change in appetite 1  0 1 2  Feeling bad or failure about yourself  1  1 1 1   Trouble concentrating 1  1 1 1   Moving slowly or fidgety/restless 0  0 0 0  Suicidal thoughts 0  0 0 0  PHQ-9 Score 12  8 11 11         History Patient Active Problem List   Diagnosis Date Noted   Insomnia due to mental condition 05/23/2021   Atherosclerosis of native artery of extremity ([place] 04/25/2021   Chronic nonalcoholic liver disease 098/92/1194  History of hepatitis B 04/25/2021   HIV  positive ([place]) 04/25/2021   Long term (current) use of insulin ([place]) 04/25/2021   Long term (current) use of anticoagulants 53/61/4431   Nonalcoholic steatohepatitis ([facility]) 04/25/2021   Type 2 diabetes mellitus with other circulatory complications ([place]) 54/00/8676   Type 2 diabetes mellitus with peripheral angiopathy ([place]) 04/25/2021   Abnormal liver function tests 04/25/2021   Statin myopathy 02/12/2021   Unilateral primary osteoarthritis, right knee 12/15/2019   Generalized weakness 09/14/2019   Severe major depression ([place]) 11/17/2018   Class 2 obesity due to excess calories without serious comorbidity with body mass index (BMI) of 36.0 to 36.9 in adult 11/17/2018   Anticoagulated 08/24/2018   Dyslipidemia 07/27/2018   Essential hypertension 06/25/2018   Hypogonadism in male 05/10/2018   Numbness 03/31/2018   Diabetic peripheral  neuropathy ([place]) 01/25/2018   S/P transmetatarsal amputation of foot, right ([place]) 10/09/2017   Hypothyroidism    Constipation    Obesity, Class III, BMI 40-49.9 (morbid obesity) ([place]) 01/15/2017   DOE (dyspnea on exertion) 01/13/2017   Imbalance 11/18/2016   Osteomyelitis of right foot ([place]) 09/22/2016   Chronic migraine 09/17/2016   Gait abnormality 09/17/2016   Fall 12/05/2015   Toe ulcer, right ([place]) 09/19/2015   Decreased pedal pulses 09/19/2015   OSA on CPAP 09/05/2015   Major depressive disorder, recurrent episode, moderate ([place]) 09/05/2015   Recurrent falls while walking 06/24/2015   H/O migraine 06/22/2015   Sinus headache 06/21/2015   OSA (obstructive sleep apnea) 06/20/2015   History of DVT (deep vein thrombosis) 06/20/2015   History of falling 06/20/2015   Chronic, continuous use of opioids 06/20/2015   Coagulopathy ([place]) 06/20/2015   Elevated CPK 06/20/2015   Pain syndrome, chronic 06/20/2015   Thrombocythemia 06/20/2015   Uncontrolled type 2 diabetes mellitus with hyperglycemia ([place]) 06/20/2015   Low back pain 06/11/2015   Unspecified abnormalities of gait and mobility 06/11/2015   Benign paroxysmal positional vertigo of right ear 05/01/2015   Chronic kidney disease, stage III (moderate) ([place]) 08/09/2014   Renal insufficiency 08/09/2014   Insulin-requiring or dependent type II diabetes mellitus ([place]) 11/07/2013   Diabetes mellitus ([place]) 11/07/2013   Steatosis of liver 09/09/2010   Human immunodeficiency virus (HIV) infection ([place]) 06/04/2006   Herpes zoster 06/04/2006   Depression 06/04/2006   THROMBOPHLEBITIS NOS 06/04/2006   Gastroesophageal reflux disease 06/04/2006   ARTHRITIS, HAND 06/04/2006   Attention deficit hyperactivity disorder 06/04/2006   Abnormal blood chemistry level 06/04/2006   Past Medical History:  Diagnosis Date   ADHD (attention deficit hyperactivity disorder)    Anxiety    Chronic kidney disease    Clotting disorder ([place])     Depression    Diabetes mellitus without complication ([place])    Diabetes mellitus, type II ([place])    Dizziness 03/17/2015   Essential hypertension 06/25/2018   GERD (gastroesophageal reflux disease)    HIV disease ([name])    HIV infection ([place])    Liver disease    OSA (obstructive sleep apnea) 07/25/2015   Uses CPAP regularly   Peripheral vascular disease ([place])    Ulcer    Past Surgical History:  Procedure Laterality Date   AMPUTATION Right 10/02/2017   Procedure: RIGHT TRANSMETATARSAL AMPUTATION;  Surgeon: Leandrew Koyanagi, MD;  Location: [facility];  Service: Orthopedics;  Laterality: Right;   SMALL INTESTINE SURGERY     STOMACH SURGERY     TOE AMPUTATION Right 08/2016   right great toe   Allergies  Allergen Reactions   Aspirin Swelling   Efavirenz  Swelling and Rash    Other reaction(s): anaphylaxis   Ibuprofen Swelling   Nsaids Other (See Comments)    unknwn   Pravastatin     myalgias   Lipitor [Atorvastatin Calcium] Other (See Comments)    Leg pain   Prior to Admission medications   Medication Sig Start Date End Date Taking? Authorizing Provider  acetaminophen (TYLENOL) 500 MG tablet Take 1,500 mg by mouth in the morning and at bedtime.   Yes [provider]  ALPRAZolam Duanne Moron) 1 MG tablet Take 1 mg by mouth at bedtime. *May take one tablet up to 4 times daily as needed for anxiety   Yes [provider]  amphetamine-dextroamphetamine (ADDERALL) 30 MG tablet Take 30 mg by mouth 3 (three) times daily.   Yes [provider]  B-D ULTRAFINE III SHORT PEN 31G X 8 MM MISC USE AS DIRECTED THREE TIMES DAILY 10/09/20  Yes Renato Shin, MD  brexpiprazole (REXULTI) 1 MG TABS tablet Take 1 mg by mouth at bedtime.   Yes [provider]  Continuous Blood Gluc Receiver (FREESTYLE LIBRE 2 READER) DEVI Use as instructed to check blood sugars. 04/18/21  Yes Renato Shin, MD  dapagliflozin propanediol (FARXIGA) 10 MG TABS tablet Take 1 tablet (10 mg total) by mouth daily  before breakfast. 04/18/21  Yes Renato Shin, MD  diclofenac Sodium (VOLTAREN) 1 % GEL APPLY 2 GM TO EACH KNEE EVERY MORNING AND EVERY NIGHT AT BEDTIME, AND APPLY 1 GM TO EACH KNEE EVERY AFTERNOON 08/07/21  Yes [name], MD  divalproex (DEPAKOTE ER) 500 MG 24 hr tablet TAKE 1 TABLET(500 MG) BY MOUTH AT BEDTIME 05/21/21  Yes [name], MD  Dulaglutide (TRULICITY) 4.5 RU/0.4VW SOPN Inject 4.5 mg as directed once a week. 10/08/20  Yes Renato Shin, MD  Evolocumab (REPATHA SURECLICK) 098 MG/ML SOAJ Inject 140 mg into the skin every 14 (fourteen) days. 04/25/21  Yes Skeet Latch, MD  ezetimibe (ZETIA) 10 MG tablet TAKE 1 TABLET(10 MG) BY MOUTH DAILY 02/20/21  Yes Skeet Latch, MD  insulin aspart (NOVOLOG FLEXPEN) 100 UNIT/ML FlexPen 15 units with breakfast and 25 units with supper.  And pen needles 3/day 04/18/21  Yes Renato Shin, MD  insulin glargine (LANTUS SOLOSTAR) 100 UNIT/ML Solostar Pen Inject 100 Units into the skin every morning. 05/28/21  Yes Renato Shin, MD  levothyroxine (SYNTHROID, LEVOTHROID) 50 MCG tablet Take 1 tablet (50 mcg total) by mouth at bedtime. 07/24/15  Yes [name], MD  ondansetron (ZOFRAN) 8 MG tablet TAKE 1 TABLET BY MOUTH EVERY 8 HOURS AS NEEDED FOR NAUSEA OR VOMITING 07/09/21  Yes [name], MD  pantoprazole (PROTONIX) 40 MG tablet TAKE 1 TABLET(40 MG) BY MOUTH DAILY 06/18/21  Yes [name], MD  pregabalin (LYRICA) 150 MG capsule Take 2 capsules (300 mg total) by mouth 2 (two) times daily. 05/08/21  Yes [name], MD  protriptyline (VIVACTIL) 10 MG tablet Take 10 mg by mouth 3 (three) times daily.  01/30/16  Yes [provider]  silodosin (RAPAFLO) 8 MG CAPS capsule Take 8 mg by mouth daily.   Yes [provider]  TRINTELLIX 20 MG TABS tablet Take 20 mg by mouth at bedtime.  01/26/15  Yes [provider]  TRIUMEQ 600-50-300 MG tablet TAKE 1 TABLET BY MOUTH DAILY 04/23/21  Yes Comer, Okey Regal, MD   XARELTO 20 MG TABS tablet TAKE 1 TABLET(20 MG) BY MOUTH DAILY WITH SUPPER 05/21/21  Yes [name], MD  zolpidem (AMBIEN) 10 MG tablet Take 10 mg by mouth at bedtime.   Yes [provider]  amLODipine (NORVASC) 5 MG tablet Take 1 tablet (5 mg total) by mouth daily. 02/07/21 05/08/21  [name] E, PA-C  furosemide (LASIX) 40 MG tablet Take 1 tablet (40 mg total) by mouth daily. 02/07/21 05/08/21  Darreld Mclean, PA-C   Social History   Socioeconomic History   Marital status: Single    Spouse name: Not on file   Number of children: Not on file   Years of education: Not on file   Highest education level: Not on file  Occupational History    Comment: DISABILITY  Tobacco Use   Smoking status: Former    Packs/day: 0.10    Years: 10.00    Total pack years: 1.00    Types: Cigars, Cigarettes    Quit date: 08/09/2014    Years since quitting: 7.0   Smokeless tobacco: Never  Vaping Use   Vaping Use: Never used  Substance and Sexual Activity   Alcohol use: No    Alcohol/week: 0.0 standard drinks of alcohol   Drug use: No   Sexual activity: Not Currently    Partners: Male    Comment: pt. declined condoms  Other Topics Concern   Not on file  Social History Narrative   Epworth Sleepiness Scale = 7 (as of 03/16/2015)   Social Determinants of Health   Financial Resource Strain: Medium Risk (02/20/2021)   Overall Financial Resource Strain (CARDIA)    Difficulty of Paying Living Expenses: Somewhat hard  Food Insecurity: No Food Insecurity (10/01/2017)   Hunger Vital Sign    Worried About Running Out of Food in the Last Year: Never true    Ran Out of Food in the Last Year: Never true  Transportation Needs: No Transportation Needs (10/01/2017)   PRAPARE - Hydrologist (Medical): No    Lack of Transportation (Non-Medical): No  Physical Activity: Inactive (10/01/2017)   Exercise Vital Sign    Days of Exercise per Week: 0 days    Minutes of  Exercise per Session: 0 min  Stress: No Stress Concern Present (10/01/2017)   [place]    Feeling of Stress : Only a little  Social Connections: Moderately Isolated (10/01/2017)   Social Connection and Isolation Panel [NHANES]    Frequency of Communication with Friends and Family: More than three times a week    Frequency of Social Gatherings with Friends and Family: Once a week    Attends Religious Services: Never    Marine scientist or Organizations: No    Attends Music therapist: Not asked    Marital Status: Never married  Intimate Partner Violence: Not At Risk (10/01/2017)   Humiliation, Afraid, Rape, and Kick questionnaire    Fear of Current or Ex-Partner: No    Emotionally Abused: No    Physically Abused: No    Sexually Abused: No    Review of Systems Per HPI   Objective:   Vitals:   08/12/21 1520  BP: 128/78  Pulse: 90  Resp: 16  Temp: 98.1 F (36.7 C)  TempSrc: Temporal  SpO2: 95%  Weight: 241 lb (109.3 kg)  Height: 5' 10"  (1.778 m)     Physical Exam Vitals reviewed.  Constitutional:      General: He is not in acute distress.    Appearance: He is well-developed. He is not  ill-appearing, toxic-appearing or diaphoretic.  HENT:     Head: Normocephalic and atraumatic.  Neck:     Vascular: No carotid bruit or JVD.  Cardiovascular:     Rate and Rhythm: Normal rate and regular rhythm.     Heart sounds: Normal heart sounds. No murmur heard. Pulmonary:     Effort: Pulmonary effort is normal.     Breath sounds: Normal breath sounds. No rales.  Musculoskeletal:     Right lower leg: No edema.     Left lower leg: No edema.  Skin:    General: Skin is warm and dry.  Neurological:     General: No focal deficit present.     Mental Status: He is alert and oriented to person, place, and time.     Comments: Equal upper extremity, lower extremity strength on testing.  He is able to stand  without assistive device, walks a few steps without apparent instability, able to turn and sit down without instability.  Psychiatric:        Mood and Affect: Mood normal.     Comments: Flat affect.        Assessment & Plan:  Daniel [name] is a 66 y.o. male . History of falling - Plan: Ambulatory referral to Occupational Therapy Diabetic peripheral neuropathy ([place]) - Plan: pregabalin (LYRICA) 150 MG capsule, Ambulatory referral to Occupational Therapy Peripheral neuropathic pain - Plan: Ambulatory referral to Occupational therapy -unfortunately did require higher dose of Lyrica few years ago.  Not being covered at that dose with his current plan.  We have submitted an appeal, but for now we will at least prescribe 150 mg 3 times daily dosing for some benefit.  -Status post 7 episodes of physical therapy.  Only 1 of 5 long-term goals met.  Note reviewed from May 31.noncompliant with home exercise program and reiteration of importance of self management was discussed. Appear to be safest with SPC or tripod cane.  -Agrees to occupational therapy eval to evaluate for other recommendations on home fall prevention, and evaluate assistive device as well as educate on assistive device use.  Consider neurology follow-up depending on OT eval and if any persistent falls.  Discussed going slow when out and about, use of assistive device, restarting Lyrica may help with neuropathic pain, ER/RTC precautions.   Meds ordered this encounter  Medications   pregabalin (LYRICA) 150 MG capsule    Sig: Take 1 capsule (150 mg total) by mouth 3 (three) times daily.    Dispense:  90 capsule    Refill:  0   Patient Instructions  Pregabalin ordered for 3 times per day for now as that appears to be covered. We do have an appeal pending to see if we can get the 371m 2 times per day dosing covered.   I will refer you to Occupational Therapy to see if other recommendations to limit falling.  I do recommend a cane  at minimum, but walker may be more helpful - let me know if you change your mind and I will send in a prescription. Depending on the OT recommendations, I may ask neuro to see you again as well.   Return to the clinic or go to the nearest emergency room if any of your symptoms worsen or new symptoms occur.  Take care.   Fall Prevention in the Home, Adult Falls can cause injuries and affect people of all ages. There are many simple things that you can do to make your home safe  and to help prevent falls. Ask for help when making these changes, if needed. What actions can I take to prevent falls? General instructions Use good lighting in all rooms. Replace any light bulbs that burn out, turn on lights if it is dark, and use night-lights. Place frequently used items in easy-to-reach places. Lower the shelves around your home if necessary. Set up furniture so that there are clear paths around it. Avoid moving your furniture around. Remove throw rugs and other tripping hazards from the floor. Avoid walking on wet floors. Fix any uneven floor surfaces. Add color or contrast paint or tape to grab bars and handrails in your home. Place contrasting color strips on the first and last steps of staircases. When you use a stepladder, make sure that it is completely opened and that the sides and supports are firmly locked. Have someone hold the ladder while you are using it. Do not climb a closed stepladder. Know where your pets are when moving through your home. What can I do in the bathroom?     Keep the floor dry. Immediately clean up any water that is on the floor. Remove soap buildup in the tub or shower regularly. Use nonskid mats or decals on the floor of the tub or shower. Attach bath mats securely with double-sided, nonslip rug tape. If you need to sit down while you are in the shower, use a plastic, nonslip stool. Install grab bars by the toilet and in the tub and shower. Do not use towel bars  as grab bars. What can I do in the bedroom? Make sure that a bedside light is easy to reach. Do not use oversized bedding that reaches the floor. Have a firm chair that has side arms to use for getting dressed. What can I do in the kitchen? Clean up any spills right away. If you need to reach for something above you, use a sturdy step stool that has a grab bar. Keep electrical cables out of the way. Do not use floor polish or wax that makes floors slippery. If you must use wax, make sure that it is non-skid floor wax. What can I do with my stairs? Do not leave any items on the stairs. Make sure that you have a light switch at the top and the bottom of the stairs. Have them installed if you do not have them. Make sure that there are handrails on both sides of the stairs. Fix handrails that are broken or loose. Make sure that handrails are as long as the staircases. Install non-slip stair treads on all stairs in your home. Avoid having throw rugs at the top or bottom of stairs, or secure the rugs with carpet tape to prevent them from moving. Choose a carpet design that does not hide the edge of steps on the stairs. Check any carpeting to make sure that it is firmly attached to the stairs. Fix any carpet that is loose or worn. What can I do on the outside of my home? Use bright outdoor lighting. Regularly repair the edges of walkways and driveways and fix any cracks. Remove high doorway thresholds. Trim any shrubbery on the main path into your home. Regularly check that handrails are securely fastened and in good repair. Both sides of all steps should have handrails. Install guardrails along the edges of any raised decks or porches. Clear walkways of debris and clutter, including tools and rocks. Have leaves, snow, and ice cleared regularly. Use sand or salt on  walkways during winter months. In the garage, clean up any spills right away, including grease or oil spills. What other actions  can I take? Wear closed-toe shoes that fit well and support your feet. Wear shoes that have rubber soles or low heels. Use mobility aids as needed, such as canes, walkers, scooters, and crutches. Review your medicines with your health care provider. Some medicines can cause dizziness or changes in blood pressure, which increase your risk of falling. Talk with your health care provider about other ways that you can decrease your risk of falls. This may include working with a physical therapist or trainer to improve your strength, balance, and endurance. Where to find more information Centers for Disease Control and Prevention, [facility]: http://www.wolf.info/ National Institute on Aging: http://kim-miller.com/ Contact a health care provider if: You are afraid of falling at home. You feel weak, drowsy, or dizzy at home. You fall at home. Summary There are many simple things that you can do to make your home safe and to help prevent falls. Ways to make your home safe include removing tripping hazards and installing grab bars in the bathroom. Ask for help when making these changes in your home. This information is not intended to replace advice given to you by your health care provider. Make sure you discuss any questions you have with your health care provider. Document Revised: 11/12/2020 Document Reviewed: 09/14/2019 Elsevier Patient Education  [place].    It is important to avoid accidents which may result in broken bones.  Here are a few ideas on how to make your home safer so you will be less likely to trip or fall.  Use nonskid mats or non slip strips in your shower or tub, on your bathroom floor and around sinks.  If you know that you have spilled water, wipe it up! In the bathroom, it is important to have properly installed grab bars on the walls or on the edge of the tub.  Towel racks are NOT strong enough for you to hold onto or to pull on for support. Stairs and hallways should have enough  light.  Add lamps or night lights if you need ore light. It is good to have handrails on both sides of the stairs if possible.  Always fix broken handrails right away. It is important to see the edges of steps.  Paint the edges of outdoor steps white so you can see them better.  Put colored tape on the edge of inside steps. Throw-rugs are dangerous because they can slide.  Removing the rugs is the best idea, but if they must stay, add adhesive carpet tape to prevent slipping. Do not keep things on stairs or in the halls.  Remove small furniture that blocks the halls as it may cause you to trip.  Keep telephone and electrical cords out of the way where you walk. Always were sturdy, rubber-soled shoes for good support.  Never wear just socks, especially on the stairs.  Socks may cause you to slip or fall.  Do not wear full-length housecoats as you can easily trip on the bottom.  Place the things you use the most on the shelves that are the easiest to reach.  If you use a stepstool, make sure it is in good condition.  If you feel unsteady, DO NOT climb, ask for help. If a health professional advises you to use a cane or walker, do not be ashamed.  These items can keep you from falling and  breaking your bones.        Signed,   [name], MD [place] 08/12/21 3:48 PM

## 2021-08-12 NOTE — Telephone Encounter (Signed)
Discussed at office visit today.  Appeal has already been worked on by [employer].  Lyrica 150 mg 3 times daily prescription given for now.

## 2021-08-12 NOTE — Telephone Encounter (Signed)
Forms where placed in DR Carlota Raspberry folder on 08/08/21 to be signed .

## 2021-08-13 ENCOUNTER — Encounter: Payer: Self-pay | Admitting: Family Medicine

## 2021-08-13 DIAGNOSIS — M5137 Other intervertebral disc degeneration, lumbosacral region: Secondary | ICD-10-CM | POA: Diagnosis not present

## 2021-08-13 DIAGNOSIS — M5136 Other intervertebral disc degeneration, lumbar region: Secondary | ICD-10-CM | POA: Diagnosis not present

## 2021-08-13 DIAGNOSIS — M9902 Segmental and somatic dysfunction of thoracic region: Secondary | ICD-10-CM | POA: Diagnosis not present

## 2021-08-13 DIAGNOSIS — M9903 Segmental and somatic dysfunction of lumbar region: Secondary | ICD-10-CM | POA: Diagnosis not present

## 2021-08-13 DIAGNOSIS — M9904 Segmental and somatic dysfunction of sacral region: Secondary | ICD-10-CM | POA: Diagnosis not present

## 2021-08-13 DIAGNOSIS — M5135 Other intervertebral disc degeneration, thoracolumbar region: Secondary | ICD-10-CM | POA: Diagnosis not present

## 2021-08-14 ENCOUNTER — Encounter: Payer: Self-pay | Admitting: Family Medicine

## 2021-08-21 ENCOUNTER — Ambulatory Visit: Payer: PPO | Admitting: Pharmacist

## 2021-08-21 DIAGNOSIS — E785 Hyperlipidemia, unspecified: Secondary | ICD-10-CM

## 2021-08-21 DIAGNOSIS — E119 Type 2 diabetes mellitus without complications: Secondary | ICD-10-CM

## 2021-08-21 NOTE — Patient Instructions (Addendum)
Visit Information   Goals Addressed             This Visit's Progress    Monitor and Manage My Blood Sugar-Diabetes Type 2   On track    Timeframe:  Long-Range Goal Priority:  High Start Date:  02/20/21                           Expected End Date: 08/21/21                      Follow Up Date 05/21/21    - check blood sugar at prescribed times - check blood sugar if I feel it is too high or too low - enter blood sugar readings and medication or insulin into daily log - take the blood sugar log to all doctor visits    Why is this important?   Checking your blood sugar at home helps to keep it from getting very high or very low.  Writing the results in a diary or log helps the doctor know how to care for you.  Your blood sugar log should have the time, date and the results.  Also, write down the amount of insulin or other medicine that you take.  Other information, like what you ate, exercise done and how you were feeling, will also be helpful.     Notes: Work on limiting gatorades and ice cream!!!       Patient Care Plan: General Pharmacy (Adult)     Problem Identified: HTN, GERD, Hypothyroidism, Neuropathy, MDD, HLD, Type II DM   Priority: High  Onset Date: 02/20/2021     Long-Range Goal: Patient-Specific Goal   Start Date: 02/20/2021  Expected End Date: 08/21/2021  Recent Progress: On track  Priority: High  Note:   Current Barriers:  Elevated lipids  Pharmacist Clinical Goal(s):  Patient will achieve improvement in LDL as evidenced by follow up labs through collaboration with PharmD and provider.   Interventions: 1:1 collaboration with [name], MD regarding development and update of comprehensive plan of care as evidenced by provider attestation and co-signature Inter-disciplinary care team collaboration (see longitudinal plan of care) Comprehensive medication review performed; medication list updated in electronic medical record  Hypertension (BP  goal <130/80) Controlled, not assessed -Uncontrolled -Current treatment: Amlodipine 57m daily -Medications previously tried: losartan  -Current home readings: 167/60-70 -Current dietary habits: high sodium intake, high carb and sugar intake - see DM for specifics -Denies hypotensive/hypertensive symptoms -Educated on BP goals and benefits of medications for prevention of heart attack, stroke and kidney damage; Exercise goal of 150 minutes per week; Importance of home blood pressure monitoring; -Counseled to monitor BP at home a few times per week, document, and provide log at future appointments -Recommended to continue current medication Previous history of losartan with unknown d/c. Recent elevated BP in office as well as at home.  Already having some fluid retention with swelling noted in feet - would be hesitant to increase amlodipine for this reason.  CMA to follow up on BP in 30 days and we will assess at that time before adding new agents.    Hyperlipidemia: (LDL goal < 70) 08/21/21 -Uncontrolled, recently started Repatha about 3 months ago via lipid clinic -Current treatment: Zetia 132mdaily Appropriate, Query effective, Repatha 140 mg into the skin every other week Appropriate, Query effective, -Medications previously tried: atorvastatin (leg pain)  -Current dietary patterns: unchanged see previous -Current  exercise habits: minimal  -Educated on Cholesterol goals;  Importance of limiting foods high in cholesterol; Benefits of Repatha, importance of adherence.  It is not costing him anything so cost will not be a barrier for him. -Recommended to continue current medication Continue to follow with lipid clinic. He has FU visit scheduled there in August, would recommend lipid panel to determine efficacy of Repatha. Encouraged him to continue to he adherent, eager to see results of next lipid panel.   Update 04/25/21 LDL elevated ASCVD risk 24.3% - high risk.  He cannot  tolerate statins in the past.  He missed his scheduled appointment with lipid clinic with cardiologist.  He needs to have some kind of more advanced treatment for lipids to reduce CV risk.  Rescheduled visit with lipid clinic.  Would recommend Repatha or Praluent if patient is agreeable. Will halep with patient assistance if needed. FU on visit next month to determine if further intervention is needed. Continue to work on lifestyle mods.    Diabetes (A1c goal <7%) 08/21/21 -Controlled, based on most recent A1c of 7.0 -Current medications: Trulicity 4.5 GG/2.6RS Appropriate, Query effective, ,  Lantus 100 units every morning Appropriate, Effective, Novolog 15 units with breakfast and 25 units with supper Appropriate, Effective, Safe, Accessible Farxiga 1m daily Appropriate, Effective, Safe, Accessible Lyrica 1521m2 capsules bid Appropriate, Effective, Safe, Accessible -Medications previously tried: metformin  -Current home glucose readings fasting glucose: has not had Freestyle lately so not checking, plans to refill after our visit today  -Denies hypoglycemic/hyperglycemic symptoms -Educated on A1c and blood sugar goals; Prevention and management of hypoglycemic episodes; Benefits of routine self-monitoring of blood sugar; Counseled to check feet daily and get yearly eye exams -He is due for an eye exam, will have [facility] reach [facility] -Recommended to continue current medication Now seeing Dr. GhCruzita [facility], has FU visit next month for A1c.  Update 04/24/21 165, 199, 212 are random glucose readings given to me today. A1c improved to 7.0 - he denies any hypoglycemia. He continues to work on diet cutting back on gatorades, ice cream floats.  He does report he drinks a substantial amount of milk - we discussed the carbohydrate and sugar content in this and he is going to try and cut back on milk.  Would caution on over basalization with  Lantus insulin over 0.5m64mg.  We could increase the amount of meal time insulin based on meals and cut back on Lantus if he is experiencing episodes of hypoglycemia with controlled or elevated A1c. FU next month on glucose readings and diet changes with CMA.   Depression/Anxiety (Goal: Minimize symptoms) -Controlled, not assessed today -Current treatment: Trintellix 5m65mily Trintellix 5mg 8mly Rexulti 1mg d40my Alprazolam 1mg pr59mivactil 10mg ti47medications previously tried/failed: Pristiq -PHQ9:  PHQ9 SCORE ONLY 02/04/2021 12/27/2020 12/05/2020  PHQ-9 Total Score 11 11 7   -Sees Dr. RupinderChucky Mayer year for follow up. -Educated on Benefits of medication for symptom control -Recommended to continue current medication Would hesitate to make changes as he is seen by psychology - of note, Trintellix maximum dose is 5mg per51m - will consult with Dr. Kaur to mToy Caresure she is aware of this dose.  GERD (Goal: Minimize symptoms) -Controlled, not assessed today -Current treatment  Pantoprazole 40mg dail91medications previously tried: none noted -Discussed appropriate timing of medication and difference between other options such as tums and Pepcid.  -Recommended to  continue current medication Work on trigger foods and medication timing.  Hypothyroidism (Goal: Maintain TSH) -Controlled, not assessed today -Current treatment  Levothyroxine 3mg daily -Medications previously tried: none noted -Most recent TSH was WNL - he takes this medication in the evening usually with milk.  -Recommended to continue current medication Counseled on appropriate timing of this medication, would hesitate to make big changes with TSH normal.  However if it continues to fluctuate he may need to make changes on medication timing to ensure adequate and consistent absorption.  HIV  -Controlled, not assessed today -Current treatment  Triumeq 600-50-300 daily -Medications previously tried:  Stribild, GJorje Guild Atripla  -Recommended to continue current medication Assessed patient finances. [place] [facility] [place] Continue as prescribed.  Patient Goals/Self-Care Activities Patient will:  - take medications as prescribed as evidenced by patient report and record review check blood pressure a few times per week, document, and provide at future appointments engage in dietary modifications by limiting gatorades to one per day and work on cutting back on servings of ice cream  Follow Up Plan: The care management team will reach out to the patient again over the next 30 days.           The patient verbalized understanding of instructions, educational materials, and care plan provided today and DECLINED offer to receive copy of patient instructions, educational materials, and care plan.  Telephone follow up appointment with pharmacy team member scheduled for: 6 months  CEdythe Clarity [place] PharmD Clinical Pharmacist  [facility]([phone]

## 2021-08-23 ENCOUNTER — Other Ambulatory Visit: Payer: Self-pay | Admitting: Family Medicine

## 2021-08-23 ENCOUNTER — Encounter: Payer: Self-pay | Admitting: Family Medicine

## 2021-08-23 DIAGNOSIS — M792 Neuralgia and neuritis, unspecified: Secondary | ICD-10-CM

## 2021-08-23 DIAGNOSIS — E1142 Type 2 diabetes mellitus with diabetic polyneuropathy: Secondary | ICD-10-CM

## 2021-08-23 DIAGNOSIS — M9904 Segmental and somatic dysfunction of sacral region: Secondary | ICD-10-CM | POA: Diagnosis not present

## 2021-08-23 DIAGNOSIS — M9903 Segmental and somatic dysfunction of lumbar region: Secondary | ICD-10-CM | POA: Diagnosis not present

## 2021-08-23 DIAGNOSIS — M5136 Other intervertebral disc degeneration, lumbar region: Secondary | ICD-10-CM | POA: Diagnosis not present

## 2021-08-23 DIAGNOSIS — M9902 Segmental and somatic dysfunction of thoracic region: Secondary | ICD-10-CM | POA: Diagnosis not present

## 2021-08-23 DIAGNOSIS — M5135 Other intervertebral disc degeneration, thoracolumbar region: Secondary | ICD-10-CM | POA: Diagnosis not present

## 2021-08-23 DIAGNOSIS — M5137 Other intervertebral disc degeneration, lumbosacral region: Secondary | ICD-10-CM | POA: Diagnosis not present

## 2021-08-23 MED ORDER — PREGABALIN 300 MG PO CAPS: 300.0000 mg | ORAL_CAPSULE | Freq: Two times a day (BID) | ORAL | 1 refills | Status: DC

## 2021-08-23 NOTE — Progress Notes (Signed)
Noted prior message and visit with pharmacist - plan will cover #60 of 358m Lyrica. Controlled substance database (PDMP) reviewed. Last filled pregabalin 1567m#90 on 6/19. If taking 4 per day to equal prior dose of 30085mID, should last 15 days. New Rx for 300m64mD #60 sent to be filled 08/26/21. Let me know if there are questions.

## 2021-08-29 ENCOUNTER — Ambulatory Visit: Payer: PPO | Admitting: Adult Health

## 2021-09-02 DIAGNOSIS — K219 Gastro-esophageal reflux disease without esophagitis: Secondary | ICD-10-CM | POA: Diagnosis not present

## 2021-09-02 DIAGNOSIS — I10 Essential (primary) hypertension: Secondary | ICD-10-CM | POA: Diagnosis not present

## 2021-09-02 DIAGNOSIS — E119 Type 2 diabetes mellitus without complications: Secondary | ICD-10-CM | POA: Diagnosis not present

## 2021-09-02 DIAGNOSIS — Z794 Long term (current) use of insulin: Secondary | ICD-10-CM | POA: Diagnosis not present

## 2021-09-02 DIAGNOSIS — G40909 Epilepsy, unspecified, not intractable, without status epilepticus: Secondary | ICD-10-CM | POA: Diagnosis not present

## 2021-09-02 DIAGNOSIS — M79605 Pain in left leg: Secondary | ICD-10-CM | POA: Diagnosis not present

## 2021-09-02 DIAGNOSIS — Z89431 Acquired absence of right foot: Secondary | ICD-10-CM | POA: Diagnosis not present

## 2021-09-02 DIAGNOSIS — R32 Unspecified urinary incontinence: Secondary | ICD-10-CM | POA: Diagnosis not present

## 2021-09-02 DIAGNOSIS — M79604 Pain in right leg: Secondary | ICD-10-CM | POA: Diagnosis not present

## 2021-09-03 ENCOUNTER — Ambulatory Visit: Payer: PPO | Attending: Family Medicine | Admitting: Occupational Therapy

## 2021-09-03 DIAGNOSIS — M5137 Other intervertebral disc degeneration, lumbosacral region: Secondary | ICD-10-CM | POA: Diagnosis not present

## 2021-09-03 DIAGNOSIS — M5135 Other intervertebral disc degeneration, thoracolumbar region: Secondary | ICD-10-CM | POA: Diagnosis not present

## 2021-09-03 DIAGNOSIS — M5136 Other intervertebral disc degeneration, lumbar region: Secondary | ICD-10-CM | POA: Diagnosis not present

## 2021-09-03 DIAGNOSIS — M9902 Segmental and somatic dysfunction of thoracic region: Secondary | ICD-10-CM | POA: Diagnosis not present

## 2021-09-03 DIAGNOSIS — M9904 Segmental and somatic dysfunction of sacral region: Secondary | ICD-10-CM | POA: Diagnosis not present

## 2021-09-03 DIAGNOSIS — R2681 Unsteadiness on feet: Secondary | ICD-10-CM | POA: Insufficient documentation

## 2021-09-03 DIAGNOSIS — M9903 Segmental and somatic dysfunction of lumbar region: Secondary | ICD-10-CM | POA: Diagnosis not present

## 2021-09-04 ENCOUNTER — Other Ambulatory Visit: Payer: Self-pay | Admitting: Internal Medicine

## 2021-09-04 DIAGNOSIS — I824Y9 Acute embolism and thrombosis of unspecified deep veins of unspecified proximal lower extremity: Secondary | ICD-10-CM

## 2021-09-04 NOTE — Telephone Encounter (Signed)
Creatinine on file from January.  CBC with borderline but improved platelets in July 2022.  Hemoglobin 17.2 at that time.  Xarelto refilled.

## 2021-09-06 DIAGNOSIS — G4733 Obstructive sleep apnea (adult) (pediatric): Secondary | ICD-10-CM | POA: Diagnosis not present

## 2021-09-10 ENCOUNTER — Encounter: Payer: PPO | Admitting: Occupational Therapy

## 2021-09-11 ENCOUNTER — Encounter: Payer: PPO | Admitting: Occupational Therapy

## 2021-09-11 DIAGNOSIS — F331 Major depressive disorder, recurrent, moderate: Secondary | ICD-10-CM | POA: Diagnosis not present

## 2021-09-11 DIAGNOSIS — F410 Panic disorder [episodic paroxysmal anxiety] without agoraphobia: Secondary | ICD-10-CM | POA: Diagnosis not present

## 2021-09-11 DIAGNOSIS — F900 Attention-deficit hyperactivity disorder, predominantly inattentive type: Secondary | ICD-10-CM | POA: Diagnosis not present

## 2021-09-12 ENCOUNTER — Telehealth: Payer: Self-pay

## 2021-09-12 ENCOUNTER — Telehealth: Payer: Self-pay | Admitting: Family Medicine

## 2021-09-12 DIAGNOSIS — E1142 Type 2 diabetes mellitus with diabetic polyneuropathy: Secondary | ICD-10-CM

## 2021-09-12 DIAGNOSIS — M792 Neuralgia and neuritis, unspecified: Secondary | ICD-10-CM

## 2021-09-12 DIAGNOSIS — Z9181 History of falling: Secondary | ICD-10-CM

## 2021-09-12 NOTE — Telephone Encounter (Signed)
Pt called in needs new referral to Occupational Therapy due to missed first appt and cancel of the r/s appt. The facility has declined r/s without new referral. Please advise

## 2021-09-12 NOTE — Telephone Encounter (Signed)
Called to discuss this request, he will need an appointment we have not recently referred him

## 2021-09-12 NOTE — Telephone Encounter (Signed)
Caller name: Maurine Cane   On DPR? :yes/no: Yes  Call back [phone]  Provider they see: Dr. Carlota Raspberry   Reason for call: Pt called stating that he needs another referral to see a Neurologist.

## 2021-09-12 NOTE — Addendum Note (Signed)
Addended by: [name] R on: 09/12/2021 10:15 PM   Modules accepted: Orders

## 2021-09-12 NOTE — Telephone Encounter (Signed)
I placed a new referral if he is able to go to OT at this time.  If he is unable to follow-up with OT, will need to cancel referral.

## 2021-09-13 ENCOUNTER — Other Ambulatory Visit: Payer: Self-pay | Admitting: Family Medicine

## 2021-09-13 ENCOUNTER — Other Ambulatory Visit: Payer: Self-pay

## 2021-09-13 DIAGNOSIS — M9902 Segmental and somatic dysfunction of thoracic region: Secondary | ICD-10-CM | POA: Diagnosis not present

## 2021-09-13 DIAGNOSIS — M9903 Segmental and somatic dysfunction of lumbar region: Secondary | ICD-10-CM | POA: Diagnosis not present

## 2021-09-13 DIAGNOSIS — M5137 Other intervertebral disc degeneration, lumbosacral region: Secondary | ICD-10-CM | POA: Diagnosis not present

## 2021-09-13 DIAGNOSIS — Z8669 Personal history of other diseases of the nervous system and sense organs: Secondary | ICD-10-CM

## 2021-09-13 DIAGNOSIS — M9904 Segmental and somatic dysfunction of sacral region: Secondary | ICD-10-CM | POA: Diagnosis not present

## 2021-09-13 DIAGNOSIS — M5135 Other intervertebral disc degeneration, thoracolumbar region: Secondary | ICD-10-CM | POA: Diagnosis not present

## 2021-09-13 DIAGNOSIS — M5136 Other intervertebral disc degeneration, lumbar region: Secondary | ICD-10-CM | POA: Diagnosis not present

## 2021-09-13 MED ORDER — DIVALPROEX SODIUM ER 500 MG PO TB24: ORAL_TABLET | ORAL | 0 refills | Status: DC

## 2021-09-13 NOTE — Telephone Encounter (Signed)
Patient is requesting a refill of the following medications: Requested Prescriptions   Pending Prescriptions Disp Refills   furosemide (LASIX) 40 MG tablet [Pharmacy Med Name: FUROSEMIDE 40MG TABLETS] 90 tablet 3    Sig: TAKE 1 TABLET(40 MG) BY MOUTH DAILY    Date of patient request: 09/13/2021 Last office visit: 08/12/2021 Date of last refill: 02/07/2021 Last refill amount: 90 tablets 3 refills  Follow up time period per chart: 02/05/2022   Patient should not be due for a medication refill

## 2021-09-16 ENCOUNTER — Ambulatory Visit: Payer: PPO | Admitting: Occupational Therapy

## 2021-09-16 ENCOUNTER — Encounter: Payer: Self-pay | Admitting: Occupational Therapy

## 2021-09-16 DIAGNOSIS — R2681 Unsteadiness on feet: Secondary | ICD-10-CM | POA: Diagnosis not present

## 2021-09-16 NOTE — Therapy (Signed)
OUTPATIENT OCCUPATIONAL THERAPY NEURO EVALUATION  Patient Name: Daniel [name] MRN: [id number] DOB:04/12/1955, 66 y.o., male Today's Date: 09/16/2021  PCP: [name], MD  REFERRING PROVIDER: [name], MD    OT End of Session - 09/16/21 1515     Visit Number 1    Number of Visits 1    OT Start Time 1400    OT Stop Time 1500    OT Time Calculation (min) 60 min    Activity Tolerance Patient tolerated treatment well    Behavior During Therapy Impulsive             Past Medical History:  Diagnosis Date   ADHD (attention deficit hyperactivity disorder)    Anxiety    Chronic kidney disease    Clotting disorder ([place])    Depression    Diabetes mellitus without complication ([place])    Diabetes mellitus, type II ([place])    Dizziness 03/17/2015   Essential hypertension 06/25/2018   GERD (gastroesophageal reflux disease)    HIV disease ([name])    HIV infection ([place])    Liver disease    OSA (obstructive sleep apnea) 07/25/2015   Uses CPAP regularly   Peripheral vascular disease ([place])    Ulcer    Past Surgical History:  Procedure Laterality Date   AMPUTATION Right 10/02/2017   Procedure: RIGHT TRANSMETATARSAL AMPUTATION;  Surgeon: Leandrew Koyanagi, MD;  Location: [facility];  Service: Orthopedics;  Laterality: Right;   SMALL INTESTINE SURGERY     STOMACH SURGERY     TOE AMPUTATION Right 08/2016   right great toe   Patient Active Problem List   Diagnosis Date Noted   Insomnia due to mental condition 05/23/2021   Atherosclerosis of native artery of extremity ([place]) 04/25/2021   Chronic nonalcoholic liver disease 27/74/1287   History of hepatitis B 04/25/2021   HIV positive ([place]) 04/25/2021   Long term (current) use of insulin ([place]) 04/25/2021   Long term (current) use of anticoagulants 86/76/7209   Nonalcoholic steatohepatitis ([place]) 04/25/2021   Type 2 diabetes mellitus with other circulatory complications ([place]) 47/10/6281   Type 2 diabetes mellitus with peripheral  angiopathy ([place]) 04/25/2021   Abnormal liver function tests 04/25/2021   Statin myopathy 02/12/2021   Unilateral primary osteoarthritis, right knee 12/15/2019   Generalized weakness 09/14/2019   Severe major depression ([place]) 11/17/2018   Class 2 obesity due to excess calories without serious comorbidity with body mass index (BMI) of 36.0 to 36.9 in adult 11/17/2018   Anticoagulated 08/24/2018   Dyslipidemia 07/27/2018   Essential hypertension 06/25/2018   Hypogonadism in male 05/10/2018   Numbness 03/31/2018   Diabetic peripheral neuropathy ([place]) 01/25/2018   S/P transmetatarsal amputation of foot, right ([place]) 10/09/2017   Hypothyroidism    Constipation    Obesity, Class III, BMI 40-49.9 (morbid obesity) ([place]) 01/15/2017   DOE (dyspnea on exertion) 01/13/2017   Imbalance 11/18/2016   Osteomyelitis of right foot ([place]) 09/22/2016   Chronic migraine 09/17/2016   Gait abnormality 09/17/2016   Fall 12/05/2015   Toe ulcer, right ([place]) 09/19/2015   Decreased pedal pulses 09/19/2015   OSA on CPAP 09/05/2015   Major depressive disorder, recurrent episode, moderate ([place]) 09/05/2015   Recurrent falls while walking 06/24/2015   H/O migraine 06/22/2015   Sinus headache 06/21/2015   OSA (obstructive sleep apnea) 06/20/2015   History of DVT (deep vein thrombosis) 06/20/2015   History of falling 06/20/2015   Chronic, continuous use of opioids 06/20/2015   Coagulopathy ([place])  06/20/2015   Elevated CPK 06/20/2015   Pain syndrome, chronic 06/20/2015   Thrombocythemia 06/20/2015   Uncontrolled type 2 diabetes mellitus with hyperglycemia ([place]) 06/20/2015   Low back pain 06/11/2015   Unspecified abnormalities of gait and mobility 06/11/2015   Benign paroxysmal positional vertigo of right ear 05/01/2015   Chronic kidney disease, stage III (moderate) ([place]) 08/09/2014   Renal insufficiency 08/09/2014   Insulin-requiring or dependent type II diabetes mellitus ([place]) 11/07/2013   Diabetes mellitus  ([place]) 11/07/2013   Steatosis of liver 09/09/2010   Human immunodeficiency virus (HIV) infection ([place]) 06/04/2006   Herpes zoster 06/04/2006   Depression 06/04/2006   THROMBOPHLEBITIS NOS 06/04/2006   Gastroesophageal reflux disease 06/04/2006   ARTHRITIS, HAND 06/04/2006   Attention deficit hyperactivity disorder 06/04/2006   Abnormal blood chemistry level 06/04/2006    ONSET DATE: 08/12/2021   REFERRING DIAG: E11.42 (ICD-10-CM) - Diabetic peripheral neuropathy ([name]) Z91.81 (ICD-10-CM) - History of falling M79.2 (ICD-10-CM) - Peripheral neuropathic pain   Per MD notes:  -Agrees to occupational therapy eval to evaluate for other recommendations on home fall prevention, and evaluate assistive device as well as educate on assistive device use.  Consider neurology follow-up depending on OT eval and if any persistent falls.  Discussed going slow when out and about, use of assistive device, restarting Lyrica may help with neuropathic pain, ER/RTC precautions.   THERAPY DIAG:  Unsteadiness on feet  Rationale for Evaluation and Treatment Rehabilitation  SUBJECTIVE:   SUBJECTIVE STATEMENT: I sometimes use the cane Pt accompanied by: self  PERTINENT HISTORY: PERTINENT HISTORY: R transmetatarsal amputation 10/02/2017, R great toe amputation 08/2016, ADHD, Anxiety, CKD, Clotting disorder, Depression, DM2, Dizziness, HTN, GERD, HIV, Liver disease, Obstructive sleep apnea, PVD, cerebellar atrophy d/t ETOH abuse.  **P.T. recommended SPC or tripod cane at d/c for safety as pt refused use of walker. Pt was non-compliant with P.T. HEP and diabetic management is poor  PRECAUTIONS: Fall and Other: HIV +  WEIGHT BEARING RESTRICTIONS No  PAIN:  Are you having pain? Yes: NPRS scale: 6/10 Pain location: back, both upper thighs Pain description: sore/achy Aggravating factors: sitting up too long Relieving factors: massager for back  FALLS: Has patient fallen in last 6 months? Yes. Number of falls at  least 6  LIVING ENVIRONMENT: Lives with: lives alone in basement of house (another family lives upstairs) 3 steps to enter basement Has following equipment at home: Single point cane, Quad cane small base, Walker - 2 wheeled, and bed side commode  PLOF: Independent  PATIENT GOALS stop falling  OBJECTIVE:   HAND DOMINANCE: Right  ADLs: Overall ADLs: independent except taking out trash - gets assist from family living upstairs   IADLs: Shopping: independent mostly, occasionally gets someone to get them or help unload them from car Light housekeeping: mod I for light cleaning, assist for heavier cleaning (pt hires someone) Meal Prep: mod I  Community mobility: drives himself - ? Safety d/t neuropathy and more weakness in Rt foot, as well as slower reaction time Medication management: independent per pt report, but does report decreased memory w/ other tasks  MOBILITY STATUS:  multiple falls, inconsistent using cane - refuses walker   UPPER EXTREMITY ROM   BUE AROM WNL's  HAND FUNCTION: Grip strength: Right: 76.4 lbs; Left: 83.5 lbs  COORDINATION: 9 Hole Peg test: Right: 30.32 sec; Left: 34.22 sec  SENSATION: Light touch and localization: WFL bilateral hands  EDEMA: none in UE's   COGNITION: Overall cognitive status:  decreased safety awareness  ,  impulsive, decreased dual tasking per pt report, decreased reaction time w/ driving  VISION: Baseline vision: Wears glasses for reading only Visual history: corrective eye surgery for cataracts   OBSERVATIONS: Pt walked in w/o cane. Pt inconsistent in reports of how he falls stating sometimes he falls when not doing anything, then he reports falling backwards, or to Lt side. Pt was non compliant w/ previous P.T. and not compliant w/ using SPC or tripod cane consistently. Pt also does not manage blood sugar level well   TODAY'S TREATMENT:  Discussed recommendations for fall prevention: removing throw rugs, keeping paths  clear, use of night lights, use of SPC or tripod cane, managing blood sugar levels/diabetic management, use of bed side commode or urinal at night, reachers in bedroom, kitchen, and one in car, and Life Alert (which he already has).  Also recommended pt do HEP from P.T.   PATIENT EDUCATION: Education details: fall prevention, task modifications Person educated: Patient Education method: Explanation Education comprehension: verbalized understanding   HOME EXERCISE PROGRAM: N/A     ASSESSMENT:  CLINICAL IMPRESSION: Patient is a 66 y.o. male who was seen today for occupational therapy evaluation for fall prevention (per MD notes). Pt w/ extensive medical history including: uncontrolled DM, HIV, cerebellar atrophy, lumbar DDD, sciatica, diabetic neuropathy, and toe amputation. Pt was seen today for evaluation and education only. Did not recommend f/u O.T. at this time as his falls appear to be due to complicated medical history and non compliance w/ previous recommendations from P.T.   PERFORMANCE DEFICITS in functional skills including ADLs, IADLs, coordination, sensation, mobility, balance, body mechanics, decreased knowledge of precautions, and decreased knowledge of use of DME, cognitive skills including memory, problem solving, safety awareness, and dual tasking ,   IMPAIRMENTS are limiting patient from ADLs, IADLs, and social participation.   COMORBIDITIES has co-morbidities such as uncontrolled DM, neuropathy  that affects occupational performance. Patient will benefit from skilled OT to address above impairments and improve overall function.  MODIFICATION OR ASSISTANCE TO COMPLETE EVALUATION: No modification of tasks or assist necessary to complete an evaluation.  OT OCCUPATIONAL PROFILE AND HISTORY: Problem focused assessment: Including review of records relating to presenting problem.  CLINICAL DECISION MAKING: LOW - limited treatment options, no task modification  necessary  REHAB POTENTIAL: Poor non compliance w/ previous therapy and medical management  EVALUATION COMPLEXITY: Low    PLAN: OT FREQUENCY: one time visit   RECOMMENDED OTHER SERVICES: consider neurology consult - pt concerned about resting tremors  CONSULTED AND AGREED WITH PLAN OF CARE: Patient  PLAN FOR NEXT SESSION: N/A, No f/u O.T. recommended at this time   Hans Eden, OT 09/16/2021, 3:16 PM

## 2021-09-17 ENCOUNTER — Other Ambulatory Visit: Payer: Self-pay

## 2021-09-17 DIAGNOSIS — M5136 Other intervertebral disc degeneration, lumbar region: Secondary | ICD-10-CM | POA: Diagnosis not present

## 2021-09-17 DIAGNOSIS — M9904 Segmental and somatic dysfunction of sacral region: Secondary | ICD-10-CM | POA: Diagnosis not present

## 2021-09-17 DIAGNOSIS — M5137 Other intervertebral disc degeneration, lumbosacral region: Secondary | ICD-10-CM | POA: Diagnosis not present

## 2021-09-17 DIAGNOSIS — M5135 Other intervertebral disc degeneration, thoracolumbar region: Secondary | ICD-10-CM | POA: Diagnosis not present

## 2021-09-17 DIAGNOSIS — Z794 Long term (current) use of insulin: Secondary | ICD-10-CM

## 2021-09-17 DIAGNOSIS — M9902 Segmental and somatic dysfunction of thoracic region: Secondary | ICD-10-CM | POA: Diagnosis not present

## 2021-09-17 DIAGNOSIS — M9903 Segmental and somatic dysfunction of lumbar region: Secondary | ICD-10-CM | POA: Diagnosis not present

## 2021-09-17 MED ORDER — TRULICITY 4.5 MG/0.5ML ~~LOC~~ SOAJ: 4.5000 mg | SUBCUTANEOUS | 3 refills | Status: DC

## 2021-09-19 ENCOUNTER — Ambulatory Visit: Payer: PPO | Admitting: Internal Medicine

## 2021-09-19 ENCOUNTER — Encounter: Payer: Self-pay | Admitting: Internal Medicine

## 2021-09-19 VITALS — BP 110/76 | HR 66 | Ht 70.0 in | Wt 248.6 lb

## 2021-09-19 DIAGNOSIS — E782 Mixed hyperlipidemia: Secondary | ICD-10-CM

## 2021-09-19 DIAGNOSIS — Z79890 Hormone replacement therapy (postmenopausal): Secondary | ICD-10-CM | POA: Diagnosis not present

## 2021-09-19 DIAGNOSIS — E1151 Type 2 diabetes mellitus with diabetic peripheral angiopathy without gangrene: Secondary | ICD-10-CM | POA: Insufficient documentation

## 2021-09-19 LAB — POCT GLYCOSYLATED HEMOGLOBIN (HGB A1C): Hemoglobin A1C: 6.6 % — AB (ref 4.0–5.6)

## 2021-09-19 NOTE — Patient Instructions (Addendum)
Try to stop milk!  Continue: - Farxiga 10 mg before b'fast - Trulicity 4.5 mg weekly - Lantus 100 units  in a.m. - Novolog 15 units in a.m. and 25 units - only take this if you eat, and in that case, inject ~15 min before a meal  Please return in 3-4 months.  PATIENT INSTRUCTIONS FOR TYPE 2 DIABETES:  DIET AND EXERCISE Diet and exercise is an important part of diabetic treatment.  We recommended aerobic exercise in the form of brisk walking (working between 40-60% of maximal aerobic capacity, similar to brisk walking) for 150 minutes per week (such as 30 minutes five days per week) along with 3 times per week performing 'resistance' training (using various gauge rubber tubes with handles) 5-10 exercises involving the major muscle groups (upper body, lower body and core) performing 10-15 repetitions (or near fatigue) each exercise. Start at half the above goal but build slowly to reach the above goals. If limited by weight, joint pain, or disability, we recommend daily walking in a swimming pool with water up to waist to reduce pressure from joints while allow for adequate exercise.    BLOOD GLUCOSES Monitoring your blood glucoses is important for continued management of your diabetes. Please check your blood glucoses 2-4 times a day: fasting, before meals and at bedtime (you can rotate these measurements - e.g. one day check before the 3 meals, the next day check before 2 of the meals and before bedtime, etc.).   HYPOGLYCEMIA (low blood sugar) Hypoglycemia is usually a reaction to not eating, exercising, or taking too much insulin/ other diabetes drugs.  Symptoms include tremors, sweating, hunger, confusion, headache, etc. Treat IMMEDIATELY with 15 grams of Carbs: 4 glucose tablets  cup regular juice/soda 2 tablespoons raisins 4 teaspoons sugar 1 tablespoon honey Recheck blood glucose in 15 mins and repeat above if still symptomatic/blood glucose <100.  RECOMMENDATIONS TO REDUCE YOUR  RISK OF DIABETIC COMPLICATIONS: * Take your prescribed MEDICATION(S) * Follow a DIABETIC diet: Complex carbs, fiber rich foods, (monounsaturated and polyunsaturated) fats * AVOID saturated/trans fats, high fat foods, >2,300 mg salt per day. * EXERCISE at least 5 times a week for 30 minutes or preferably daily.  * DO NOT SMOKE OR DRINK more than 1 drink a day. * Check your FEET every day. Do not wear tightfitting shoes. Contact us if you develop an ulcer * See your EYE doctor once a year or more if needed * Get a FLU shot once a year * Get a PNEUMONIA vaccine once before and once after age 34 years  GOALS:  * Your Hemoglobin A1c of <7%  * fasting sugars need to be <130 * after meals sugars need to be <180 (2h after you start eating) * Your Systolic BP should be 732 or lower  * Your Diastolic BP should be 80 or lower  * Your HDL (Good Cholesterol) should be 40 or higher  * Your LDL (Bad Cholesterol) should be 100 or lower. * Your Triglycerides should be 150 or lower  * Your Urine microalbumin (kidney function) should be <30 * Your Body Mass Index should be 25 or lower    Please consider the following ways to cut down carbs and fat and increase fiber and micronutrients in your diet: - substitute whole grain for white bread or pasta - substitute brown rice for white rice - substitute 90-calorie flat bread pieces for slices of bread when possible - substitute sweet potatoes or yams for white potatoes -  substitute humus for margarine - substitute tofu for cheese when possible - substitute almond or rice milk for regular milk (would not drink soy milk daily due to concern for soy estrogen influence on breast cancer risk) - substitute dark chocolate for other sweets when possible - substitute water - can add lemon or orange slices for taste - for diet sodas (artificial sweeteners will trick your body that you can eat sweets without getting calories and will lead you to overeating and weight  gain in the long run) - do not skip breakfast or other meals (this will slow down the metabolism and will result in more weight gain over time)  - can try smoothies made from fruit and almond/rice milk in am instead of regular breakfast - can also try old-fashioned (not instant) oatmeal made with almond/rice milk in am - order the dressing on the side when eating salad at a restaurant (pour less than half of the dressing on the salad) - eat as little meat as possible - can try juicing, but should not forget that juicing will get rid of the fiber, so would alternate with eating raw veg./fruits or drinking smoothies - use as little oil as possible, even when using olive oil - can dress a salad with a mix of balsamic vinegar and lemon juice, for e.g. - use agave nectar, stevia sugar, or regular sugar rather than artificial sweateners - steam or broil/roast veggies  - snack on veggies/fruit/nuts (unsalted, preferably) when possible, rather than processed foods - reduce or eliminate aspartame in diet (it is in diet sodas, chewing gum, etc) Read the labels!  Try to read Dr. Janene Harvey book: "Program for Reversing Diabetes" for other ideas for healthy eating.

## 2021-09-19 NOTE — Progress Notes (Signed)
Patient ID: Daniel [name], male   DOB: 27-Sep-1955, 66 y.o.   MRN: [id number]  HPI: Daniel [name] is a 66 y.o.-year-old male, returning for follow-up for DM2, dx in 2015, insulin-dependent since 2016, uncontrolled, with complications (PAD, foot ulcer, status post right transmetatarsal amputation, stage IIIa CKD, PN). Pt. previously saw [name], last visit 5 months ago.  Reviewed HbA1c: Lab Results  Component Value Date   HGBA1C 7.0 (A) 04/11/2021   HGBA1C 7.2 (A) 01/09/2021   HGBA1C 7.5 (A) 10/08/2020   HGBA1C 6.3 (A) 05/07/2020   HGBA1C 6.2 (A) 08/24/2019   HGBA1C 7.2 (A) 05/11/2019   HGBA1C 7.5 (A) 02/01/2019   HGBA1C 7.7 (A) 11/08/2018   HGBA1C 7.8 (A) 09/07/2018   HGBA1C 9.8 (A) 03/31/2018   Pt is on a regimen of: - Farxiga 10 mg before b'fast - Trulicity 4.5 mg weekly - Lantus 100 units  in a.m. -he has 8 mm pen needles - Novolog 15 units in a.m. and 25 units before dinner but takes it even if skips a meal - late dinner (11 pm-4 am)  He is on a CGM - did not bring the receiver: - am: 100-200 - 2h after b'fast: n/c - before lunch: n/c - 2h after lunch: n/c - before dinner: 100-250 - 2h after dinner: n/c - bedtime: n/c - nighttime: n/c Lowest sugar was 50s (took insulin and did not eat); he has hypoglycemia awareness <60.  Highest sugar was 400s.  Glucometer: ?  He drinks 4-5 gallons of 2% milk a week. He sees nutritionist.  - + CKD, last BUN/creatinine:  Lab Results  Component Value Date   BUN 28 (H) 03/26/2021   BUN 20 05/07/2020   CREATININE 1.68 (H) 03/26/2021   CREATININE 1.54 (H) 05/07/2020  He is not on an ACE inhibitor/ARB.  -+ HL; last set of lipids: Lab Results  Component Value Date   CHOL 215 (H) 03/26/2021   HDL 39 (L) 03/26/2021   LDLCALC 136 (H) 03/26/2021   TRIG 222 (H) 03/26/2021   CHOLHDL 5.5 (H) 03/26/2021  He is on Zetia 10 mg daily, Repatha.  He is intolerant to statins due to myalgias.   He does have a history of NASH.  - last  eye exam was in 02/14/2021. No DR.   - + numbness and tingling in his feet.  Last foot exam was 10/08/2020.  He is on Lyrica 300 mg twice a day.  He does not have a hypothyroidism - this is Rx'ed by his psychiatrist - Dr. Toy Care. Pt is on levothyroxine 50 mcg daily, taken: - in am - fasting - 15-20 min from b'fast  Reviewed his latest TSH levels: Lab Results  Component Value Date   TSH 0.47 05/07/2020   TSH 0.108 (L) 09/14/2019   TSH 0.736 09/17/2016   TSH 0.19 (L) 07/11/2015   TSH 1.067 06/21/2013   TSH 2.762 01/01/2007   He also has a history of HTN, liver disease, OSA, HIV, ADHD, on chronic opioids.  ROS: + see HPI No increased urination, blurry vision, chest pain. He has nausea with   Past Medical History:  Diagnosis Date   ADHD (attention deficit hyperactivity disorder)    Anxiety    Chronic kidney disease    Clotting disorder ([place])    Depression    Diabetes mellitus without complication ([place])    Diabetes mellitus, type II ([place])    Dizziness 03/17/2015   Essential hypertension 06/25/2018   GERD (gastroesophageal reflux disease)  HIV disease ([employer])    HIV infection ([place])    Liver disease    OSA (obstructive sleep apnea) 07/25/2015   Uses CPAP regularly   Peripheral vascular disease ([place])    Ulcer    Past Surgical History:  Procedure Laterality Date   AMPUTATION Right 10/02/2017   Procedure: RIGHT TRANSMETATARSAL AMPUTATION;  Surgeon: Leandrew Koyanagi, MD;  Location: [place];  Service: Orthopedics;  Laterality: Right;   SMALL INTESTINE SURGERY     STOMACH SURGERY     TOE AMPUTATION Right 08/2016   right great toe   Social History   Socioeconomic History   Marital status: Single    Spouse name: Not on file   Number of children: Not on file   Years of education: Not on file   Highest education level: Not on file  Occupational History    Comment: DISABILITY  Tobacco Use   Smoking status: Former    Packs/day: 0.10    Years: 10.00    Total pack years: 1.00     Types: Cigars, Cigarettes    Quit date: 08/09/2014    Years since quitting: 7.1   Smokeless tobacco: Never  Vaping Use   Vaping Use: Never used  Substance and Sexual Activity   Alcohol use: No    Alcohol/week: 0.0 standard drinks of alcohol   Drug use: No   Sexual activity: Not Currently    Partners: Male    Comment: pt. declined condoms  Other Topics Concern   Not on file  Social History Narrative   Epworth Sleepiness Scale = 7 (as of 03/16/2015)   Social Determinants of Health   Financial Resource Strain: Medium Risk (02/20/2021)   Overall Financial Resource Strain (CARDIA)    Difficulty of Paying Living Expenses: Somewhat hard  Food Insecurity: No Food Insecurity (10/01/2017)   Hunger Vital Sign    Worried About Running Out of Food in the Last Year: Never true    Ran Out of Food in the Last Year: Never true  Transportation Needs: No Transportation Needs (10/01/2017)   PRAPARE - Hydrologist (Medical): No    Lack of Transportation (Non-Medical): No  Physical Activity: Inactive (10/01/2017)   Exercise Vital Sign    Days of Exercise per Week: 0 days    Minutes of Exercise per Session: 0 min  Stress: No Stress Concern Present (10/01/2017)   [place]    Feeling of Stress : Only a little  Social Connections: Moderately Isolated (10/01/2017)   Social Connection and Isolation Panel [NHANES]    Frequency of Communication with Friends and Family: More than three times a week    Frequency of Social Gatherings with Friends and Family: Once a week    Attends Religious Services: Never    Marine scientist or Organizations: No    Attends Music therapist: Not asked    Marital Status: Never married  Intimate Partner Violence: Not At Risk (10/01/2017)   Humiliation, Afraid, Rape, and Kick questionnaire    Fear of Current or Ex-Partner: No    Emotionally Abused: No    Physically  Abused: No    Sexually Abused: No   Current Outpatient Medications on File Prior to Visit  Medication Sig Dispense Refill   acetaminophen (TYLENOL) 500 MG tablet Take 1,500 mg by mouth in the morning and at bedtime.     ALPRAZolam (XANAX) 1 MG tablet Take 1 mg by  mouth at bedtime. *May take one tablet up to 4 times daily as needed for anxiety     amphetamine-dextroamphetamine (ADDERALL) 30 MG tablet Take 30 mg by mouth 3 (three) times daily.     B-D ULTRAFINE III SHORT PEN 31G X 8 MM MISC USE AS DIRECTED THREE TIMES DAILY 100 each 2   brexpiprazole (REXULTI) 1 MG TABS tablet Take 1 mg by mouth at bedtime.     Continuous Blood Gluc Receiver (FREESTYLE LIBRE 2 READER) DEVI Use as instructed to check blood sugars. 1 each 2   dapagliflozin propanediol (FARXIGA) 10 MG TABS tablet Take 1 tablet (10 mg total) by mouth daily before breakfast. 90 tablet 3   diclofenac Sodium (VOLTAREN) 1 % GEL APPLY 2 GM TO EACH KNEE EVERY MORNING AND EVERY NIGHT AT BEDTIME, AND APPLY 1 GM TO EACH KNEE EVERY AFTERNOON 300 g 0   divalproex (DEPAKOTE ER) 500 MG 24 hr tablet Take one tablet at bedtime 90 tablet 0   Dulaglutide (TRULICITY) 4.5 KX/3.8HW SOPN Inject 4.5 mg as directed once a week. 6 mL 3   Evolocumab (REPATHA SURECLICK) 299 MG/ML SOAJ Inject 140 mg into the skin every 14 (fourteen) days. 2 mL 11   ezetimibe (ZETIA) 10 MG tablet TAKE 1 TABLET(10 MG) BY MOUTH DAILY 90 tablet 3   furosemide (LASIX) 40 MG tablet TAKE 1 TABLET(40 MG) BY MOUTH DAILY 90 tablet 3   insulin aspart (NOVOLOG FLEXPEN) 100 UNIT/ML FlexPen 15 units with breakfast and 25 units with supper.  And pen needles 3/day 45 mL 3   insulin glargine (LANTUS SOLOSTAR) 100 UNIT/ML Solostar Pen Inject 100 Units into the skin every morning. 105 mL 3   levothyroxine (SYNTHROID, LEVOTHROID) 50 MCG tablet Take 1 tablet (50 mcg total) by mouth at bedtime. 30 tablet 11   ondansetron (ZOFRAN) 8 MG tablet TAKE 1 TABLET BY MOUTH EVERY 8 HOURS AS NEEDED FOR NAUSEA  OR VOMITING 20 tablet 1   pantoprazole (PROTONIX) 40 MG tablet TAKE 1 TABLET(40 MG) BY MOUTH DAILY 90 tablet 1   pregabalin (LYRICA) 300 MG capsule Take 1 capsule (300 mg total) by mouth 2 (two) times daily. 60 capsule 1   protriptyline (VIVACTIL) 10 MG tablet Take 10 mg by mouth 3 (three) times daily.   11   silodosin (RAPAFLO) 8 MG CAPS capsule Take 8 mg by mouth daily.     TRINTELLIX 20 MG TABS tablet Take 20 mg by mouth at bedtime.      TRIUMEQ 600-50-300 MG tablet TAKE 1 TABLET BY MOUTH DAILY 30 tablet 5   XARELTO 20 MG TABS tablet TAKE 1 TABLET(20 MG) BY MOUTH DAILY WITH SUPPER 90 tablet 0   zolpidem (AMBIEN) 10 MG tablet Take 10 mg by mouth at bedtime.     No current facility-administered medications on file prior to visit.   Allergies  Allergen Reactions   Aspirin Swelling   Efavirenz Swelling and Rash    Other reaction(s): anaphylaxis   Ibuprofen Swelling   Nsaids Other (See Comments)    unknwn   Pravastatin     myalgias   Lipitor [Atorvastatin Calcium] Other (See Comments)    Leg pain   Family History  Problem Relation Age of Onset   Depression Brother    Throat cancer Brother        half brother, never smoker   COPD Mother    Diabetes Neg Hx    PE: BP 110/76 (BP Location: Left Arm, Patient Position: Sitting, Cuff Size:  Normal)   Pulse 66   Ht 5' 10"  (1.778 m)   Wt 248 lb 9.6 oz (112.8 kg)   SpO2 96%   BMI 35.67 kg/m  Wt Readings from Last 3 Encounters:  09/19/21 248 lb 9.6 oz (112.8 kg)  08/12/21 241 lb (109.3 kg)  07/15/21 255 lb (115.7 kg)   Constitutional: overweight, in NAD, patient has his eyes closed for the majority of the appointment and appears to be disengaged with the conversation Eyes: no exophthalmos ENT: moist mucous membranes, no thyromegaly, no cervical lymphadenopathy Cardiovascular: RRR, No MRG, + mild LE edema B Respiratory: CTA B Musculoskeletal: no deformities Skin: moist, warm, no rashes Neurological: no tremor with outstretched  hands  ASSESSMENT: 1. DM2, insulin-dependent, uncontrolled, with complications - PAD - foot ulcer, status post right transmetatarsal amputation - stage IIIa CKD - PN  2.  Hyperlipidemia  3.  Acquired hypothyroidism  PLAN:  1. Patient with long-standing, uncontrolled diabetes, on oral antidiabetic regimen with SGLT2 inhibitor, also basal-bolus insulin regimen and weekly GLP-1 receptor agonist, with improving control.  Latest HbA1c was 7.0% in 03/2021.  At today's visit, HbA1c is better, at 6.6%. -Patient did not bring his CGM receiver at today's visit so we were not able to download his Elenor Legato 2 sensor.  He cannot remember the sugars well and gives me vague ranges.  Upon questioning, he is taking NovoLog without the relationship to the meals, even if he skips the meal.  We discussed that ideally NovoLog is taken approximately 15 minutes before each meal and I advised him not to take it unless he plans to eat.  He did have some low blood sugars, per his report, in the 50s, possibly due to taking insulin and not eating.  However, upon questioning, patient is telling me that he is drinking 4 to 5 gallons of 2% milk a week.  I advised him that this is very damaging for this diabetes control and strongly advised him to stop.  He tells me that he switched from whole milk to 2% milk and is not willing to reduce or stop this.  He feels that this is somewhat better than drinking regular soda or tea.  I advised him that it is possible, but not by much.  I advised him that until he stops milk he probably will continue to have fluctuations in the blood sugars so overall poor diabetes control despite the good HbA1c. -For now, I did not recommend other changes in his regimen.  I did advise him to bring his CGM receiver at next visit.  Further fine-tuning of his regimen could be done at that time. - I suggested to:  Patient Instructions  Try to stop milk!  Continue: - Farxiga 10 mg before b'fast - Trulicity  4.5 mg weekly - Lantus 100 units  in a.m. - Novolog 15 units in a.m. and 25 units - only take this if you eat, and in that case, inject ~15 min before a meal  Please return in 3-4 months.  - check sugars at different times of the day - check 4x a day, rotating checks - discussed about CBG targets for treatment: 80-130 mg/dL before meals and <180 mg/dL after meals; target HbA1c <7%. - given foot care handout  - given instructions for hypoglycemia management "15-15 rule"  - advised for yearly eye exams  - Return to clinic in 3-4 mo   2. HL - Reviewed latest lipid panel from 02/2021: All fractions abnormal: Lab Results  Component  Value Date   CHOL 215 (H) 03/26/2021   HDL 39 (L) 03/26/2021   LDLCALC 136 (H) 03/26/2021   TRIG 222 (H) 03/26/2021   CHOLHDL 5.5 (H) 03/26/2021  - Continues Zetia and Repatha without side effects.  He could not tolerate statins due to myalgias.  3.  Thyroid hormone use -Apparently without the diagnosis of hypothyroidism.  Per patient, he is using a low-dose levothyroxine at the recommendation of his psychiatrist - latest thyroid labs reviewed with pt. >> normal: Lab Results  Component Value Date   TSH 0.47 05/07/2020  - he continues on LT4 50 mcg daily  - Total time spent for the visit: 40 min, in precharting, reviewing Dr. Cordelia Pen last note, obtaining medical information from the chart and from the pt, reviewing his  previous labs, evaluations, and treatments, reviewing his symptoms, counseling him about his diabetes and diet (please see the discussed topics above), and developing a plan to further treat it.   Philemon Kingdom, MD PhD [facility] Endocrinology

## 2021-09-25 ENCOUNTER — Encounter: Payer: PPO | Admitting: Occupational Therapy

## 2021-10-01 DIAGNOSIS — M9904 Segmental and somatic dysfunction of sacral region: Secondary | ICD-10-CM | POA: Diagnosis not present

## 2021-10-01 DIAGNOSIS — M9903 Segmental and somatic dysfunction of lumbar region: Secondary | ICD-10-CM | POA: Diagnosis not present

## 2021-10-01 DIAGNOSIS — M5136 Other intervertebral disc degeneration, lumbar region: Secondary | ICD-10-CM | POA: Diagnosis not present

## 2021-10-01 DIAGNOSIS — M5135 Other intervertebral disc degeneration, thoracolumbar region: Secondary | ICD-10-CM | POA: Diagnosis not present

## 2021-10-01 DIAGNOSIS — M9902 Segmental and somatic dysfunction of thoracic region: Secondary | ICD-10-CM | POA: Diagnosis not present

## 2021-10-01 DIAGNOSIS — M5137 Other intervertebral disc degeneration, lumbosacral region: Secondary | ICD-10-CM | POA: Diagnosis not present

## 2021-10-07 DIAGNOSIS — G4733 Obstructive sleep apnea (adult) (pediatric): Secondary | ICD-10-CM | POA: Diagnosis not present

## 2021-10-07 DIAGNOSIS — Z789 Other specified health status: Secondary | ICD-10-CM

## 2021-10-07 NOTE — Progress Notes (Signed)
[facility] [place] [facility])                                            [place] Team                                        Statin Quality Measure Assessment    10/07/2021  Daniel [name] [facility] 08/18/1955 [id number]  Per review of chart and payor information, this patient has been flagged for non-adherence to the following CMS Quality Measure:   [x]  Statin Use in Persons with Diabetes  []  Statin Use in Persons with Cardiovascular Disease  The 10-year ASCVD risk score (Arnett DK, et al., 2019) is: 29.7%   Values used to calculate the score:     Age: 21 years     Sex: Male     Is Non-Hispanic African American: No     Diabetic: Yes     Tobacco smoker: No     Systolic Blood Pressure: 492 mmHg     Is BP treated: Yes     HDL Cholesterol: 39 mg/dL     Total Cholesterol: 215 mg/dL  This patient is failing SUPD as a result of documented statin intolerance. Zetia and Repatha on file. If deemed clinically appropriate, please consider associating exclusion code (see options below) at the next office visit on 10/08/2021.    Please consider ONE of the following recommendations:   Initiate high intensity statin Atorvastatin 72m once daily, #90, 3 refills   Rosuvastatin 219monce daily, #90, 3 refills    Initiate moderate intensity          statin with reduced frequency if prior          statin intolerance 1x weekly, #13, 3 refills   2x weekly, #26, 3 refills   3x weekly, #39, 3 refills   Code for past statin intolerance or other exclusions (required annually)  Drug Induced Myopathy G72.0   Myositis, unspecified M60.9   Rhabdomyolysis M62.82   Cirrhosis of liver K74.69   Biliary cirrhosis, unspecified K74.5   Thank you for your time,  [place]: [phone]

## 2021-10-08 ENCOUNTER — Ambulatory Visit (HOSPITAL_BASED_OUTPATIENT_CLINIC_OR_DEPARTMENT_OTHER): Payer: PPO | Admitting: Cardiovascular Disease

## 2021-10-08 ENCOUNTER — Encounter (HOSPITAL_BASED_OUTPATIENT_CLINIC_OR_DEPARTMENT_OTHER): Payer: Self-pay | Admitting: Cardiovascular Disease

## 2021-10-08 VITALS — BP 122/60 | HR 107 | Ht 70.0 in | Wt 244.0 lb

## 2021-10-08 DIAGNOSIS — R0609 Other forms of dyspnea: Secondary | ICD-10-CM | POA: Diagnosis not present

## 2021-10-08 DIAGNOSIS — M5135 Other intervertebral disc degeneration, thoracolumbar region: Secondary | ICD-10-CM | POA: Diagnosis not present

## 2021-10-08 DIAGNOSIS — Z79899 Other long term (current) drug therapy: Secondary | ICD-10-CM | POA: Diagnosis not present

## 2021-10-08 DIAGNOSIS — M9903 Segmental and somatic dysfunction of lumbar region: Secondary | ICD-10-CM | POA: Diagnosis not present

## 2021-10-08 DIAGNOSIS — E785 Hyperlipidemia, unspecified: Secondary | ICD-10-CM

## 2021-10-08 DIAGNOSIS — I10 Essential (primary) hypertension: Secondary | ICD-10-CM | POA: Diagnosis not present

## 2021-10-08 DIAGNOSIS — G720 Drug-induced myopathy: Secondary | ICD-10-CM

## 2021-10-08 DIAGNOSIS — M5137 Other intervertebral disc degeneration, lumbosacral region: Secondary | ICD-10-CM | POA: Diagnosis not present

## 2021-10-08 DIAGNOSIS — M9904 Segmental and somatic dysfunction of sacral region: Secondary | ICD-10-CM | POA: Diagnosis not present

## 2021-10-08 DIAGNOSIS — G4733 Obstructive sleep apnea (adult) (pediatric): Secondary | ICD-10-CM

## 2021-10-08 DIAGNOSIS — M9902 Segmental and somatic dysfunction of thoracic region: Secondary | ICD-10-CM | POA: Diagnosis not present

## 2021-10-08 DIAGNOSIS — I70229 Atherosclerosis of native arteries of extremities with rest pain, unspecified extremity: Secondary | ICD-10-CM

## 2021-10-08 DIAGNOSIS — M5136 Other intervertebral disc degeneration, lumbar region: Secondary | ICD-10-CM | POA: Diagnosis not present

## 2021-10-08 NOTE — Assessment & Plan Note (Signed)
He has chronic exertional dyspnea.  Echo is at stress test have repeatedly been unremarkable.  He does have some very mild lower extremity edema and notes poor output to Lasix.  We will check a BNP and a CMP today.  Continue Lasix as currently dosed for now.

## 2021-10-08 NOTE — Patient Instructions (Addendum)
Medication Instructions:  Continue current medications  *If you need a refill on your cardiac medications before your next appointment, please call your pharmacy*   Lab Work: Fasting Lipids, CMP, BNP  If you have labs (blood work) drawn today and your tests are completely normal, you will receive your results only by: [place] (if you have MyChart) OR A paper copy in the mail If you have any lab test that is abnormal or we need to change your treatment, we will call you to review the results.   Testing/Procedures: Your physician has requested that you have a lower extremity arterial duplex. This test is an ultrasound of the arteries in the legs or arms. It looks at arterial blood flow in the legs and arms. Allow one hour for Lower and Upper Arterial scans. There are no restrictions or special instructions  Your physician has requested that you have an ankle brachial index (ABI). During this test an ultrasound and blood pressure cuff are used to evaluate the arteries that supply the arms and legs with blood. Allow thirty minutes for this exam. There are no restrictions or special instructions.    Follow-Up: At [facility], you and your health needs are our priority.  As part of our continuing mission to provide you with exceptional heart care, we have created designated Provider Care Teams.  These Care Teams include your primary Cardiologist (physician) and Advanced Practice Providers (APPs -  Physician Assistants and Nurse Practitioners) who all work together to provide you with the care you need, when you need it.  We recommend signing up for the patient portal called "MyChart".  Sign up information is provided on this After Visit Summary.  MyChart is used to connect with patients for Virtual Visits (Telemedicine).  Patients are able to view lab/test results, encounter notes, upcoming appointments, etc.  Non-urgent messages can be sent to your provider as well.   To learn more  about what you can do with MyChart, go to [email or web address].    Your next appointment:   6 month(s)  The format for your next appointment:   In Person  Provider:   Skeet Latch, MD    Other Instructions   Important Information About Sugar

## 2021-10-08 NOTE — Progress Notes (Signed)
  Cardiology Office Note  Date:  10/08/2021   ID:  Daniel [name], DOB 01/21/1956, MRN [id number]  PCP:  Greene, Jeffrey R, MD  Cardiologist:    [place], MD   No chief complaint on file.   History of Present Illness:   Daniel [name] is a 66 y.o. male with HIV, recurrent DV/PE on lifelong anticoagulation, OSA on CPAP, and diabetes who presents for follow up. Daniel [name] was first evaluated 02/2015 for dizziness and falls.  The symptoms had been ongoing for several months.  He felt as though the room was spinning when changing position, leaning his head back or laying down.  He was referred for an echo 03/2015 that revealed LVEF 60-65% with mild LVH.  He also had carotid Dopplers 02/2015 that revealed minimal plaque bilaterally.  Additionally, he reported exertional dyspnea so he was referred for Lexiscan Cardiolite which was negative for ischemia. In the interim Daniel [name] also had nerve conduction studies that revealed peripheral neuropathy consistent with diabetic neuropathy. He was referred for a 30 day event monitor 07/30/2015 that was unremarkable.  Daniel [name] had toes amputated on his R leg.  ABIs were normal 09/2015 prior to his great toe amputation.   Atypical chest pain and had a Lexiscan Myoview 03/2018 that revealed LVEF 59% and no ischemia.  Daniel [name] had lipids checked which revealed an LDL of 113.  This was down from 154.  He had a persistent transaminitis with AST 52 and ALT 64.  This persisted after stopping statins.   His blood pressure was poorly controlled so amlodipine was added.  He followed up with Luke Kilroy, PA-C on 07/2018, at which time his blood pressure remained poorly controlled.   At the last visit he reported dyspnea and swelling. Echo 01/2020 revealed LVEF 60-65% with mild LVH and grade 1 diastolic dysfunction. RA pressure was 3 mmHg. He saw Callie Goodrich, PA on 01/2021. He had been holding his Lasix but was prepared to resume it after recently starting Flomax. He was  started on amlodipine for improved blood pressure control.  Today, he reports a couple of mechanical falls due to tripping. Mostly he was without major injury. Although after one fall he required stitches to close a right periorbital wound. In the setting of his recent falls, he is also concerned that the "marbles in his head have moved". Last night while cooking dinner, he noticed himself leaning to one side and beginning to fall. He would describe this as feeling off-kilter; he denies any dizziness. He has also bumped into objects such as doors more frequently. While walking he is sometimes limited by bilateral knee pain. No anginal symptoms. Also, he endorses ongoing LE edema. After taking furosemide he has not noticed urinary frequency. In fact, he occasionally has urinary retention when he tries to use the restroom. At home his weight has been fluctuating mostly between 240 and 250 lbs. During one week, his weight dropped 20 lbs. Regarding his diet, he found a new sugar-free tea that he enjoys. He may drink a pepsi every once in a while. Or he will have a Gatorade after coming in from yard work or taking out the trash (has to climb 10-15 steps). He admits to not participating in formal exercise, mostly due to laziness. Additionally, he complains of having difficulty with memory loss in the past 6 months to a year. He has discussed this with his PCP and another provider, who felt no further workup was warranted at this   time. In his regimen, he confirms that he was taken off Trintellix. He was started on Auvelity 45-105, which he has been taking for a month. He remains compliant with his CPAP. He denies any palpitations, or chest pain. No headaches, syncope, orthopnea, or PND.     Past Medical History:  Diagnosis Date   ADHD (attention deficit hyperactivity disorder)    Anxiety    Chronic kidney disease    Clotting disorder ([place])    Depression    Diabetes mellitus without complication ([place])     Diabetes mellitus, type II ([place])    Dizziness 03/17/2015   Essential hypertension 06/25/2018   GERD (gastroesophageal reflux disease)    HIV disease ([name])    HIV infection ([name])    Liver disease    OSA (obstructive sleep apnea) 07/25/2015   Uses CPAP regularly   Peripheral vascular disease ([place])    Ulcer     Past Surgical History:  Procedure Laterality Date   AMPUTATION Right 10/02/2017   Procedure: RIGHT TRANSMETATARSAL AMPUTATION;  Surgeon: Leandrew Koyanagi, MD;  Location: [facility];  Service: Orthopedics;  Laterality: Right;   SMALL INTESTINE SURGERY     STOMACH SURGERY     TOE AMPUTATION Right 08/2016   right great toe    Current Outpatient Medications  Medication Sig Dispense Refill   acetaminophen (TYLENOL) 500 MG tablet Take 1,500 mg by mouth in the morning and at bedtime.     ALPRAZolam (XANAX) 1 MG tablet Take 1 mg by mouth at bedtime. *May take one tablet up to 4 times daily as needed for anxiety     amphetamine-dextroamphetamine (ADDERALL) 30 MG tablet Take 30 mg by mouth 3 (three) times daily.     B-D ULTRAFINE III SHORT PEN 31G X 8 MM MISC USE AS DIRECTED THREE TIMES DAILY 100 each 2   brexpiprazole (REXULTI) 1 MG TABS tablet Take 1 mg by mouth at bedtime.     Continuous Blood Gluc Receiver (FREESTYLE LIBRE 2 READER) DEVI Use as instructed to check blood sugars. 1 each 2   dapagliflozin propanediol (FARXIGA) 10 MG TABS tablet Take 1 tablet (10 mg total) by mouth daily before breakfast. 90 tablet 3   Dextromethorphan-buPROPion ER (AUVELITY) 45-105 MG TBCR Take 1 tablet by mouth daily at 12 noon.     diclofenac Sodium (VOLTAREN) 1 % GEL APPLY 2 GM TO EACH KNEE EVERY MORNING AND EVERY NIGHT AT BEDTIME, AND APPLY 1 GM TO EACH KNEE EVERY AFTERNOON 300 g 0   divalproex (DEPAKOTE ER) 500 MG 24 hr tablet Take one tablet at bedtime 90 tablet 0   Dulaglutide (TRULICITY) 4.5 FX/9.0WI SOPN Inject 4.5 mg as directed once a week. 6 mL 3   Evolocumab (REPATHA SURECLICK) 097 MG/ML SOAJ Inject 140  mg into the skin every 14 (fourteen) days. 2 mL 11   ezetimibe (ZETIA) 10 MG tablet TAKE 1 TABLET(10 MG) BY MOUTH DAILY 90 tablet 3   furosemide (LASIX) 40 MG tablet TAKE 1 TABLET(40 MG) BY MOUTH DAILY 90 tablet 3   insulin aspart (NOVOLOG FLEXPEN) 100 UNIT/ML FlexPen 15 units with breakfast and 25 units with supper.  And pen needles 3/day 45 mL 3   insulin glargine (LANTUS SOLOSTAR) 100 UNIT/ML Solostar Pen Inject 100 Units into the skin every morning. 105 mL 3   levothyroxine (SYNTHROID, LEVOTHROID) 50 MCG tablet Take 1 tablet (50 mcg total) by mouth at bedtime. 30 tablet 11   ondansetron (ZOFRAN) 8 MG tablet TAKE 1 TABLET BY  MOUTH EVERY 8 HOURS AS NEEDED FOR NAUSEA OR VOMITING 20 tablet 1   pantoprazole (PROTONIX) 40 MG tablet TAKE 1 TABLET(40 MG) BY MOUTH DAILY 90 tablet 1   pregabalin (LYRICA) 300 MG capsule Take 1 capsule (300 mg total) by mouth 2 (two) times daily. 60 capsule 1   protriptyline (VIVACTIL) 10 MG tablet Take 10 mg by mouth 3 (three) times daily.   11   silodosin (RAPAFLO) 8 MG CAPS capsule Take 8 mg by mouth daily.     TRIUMEQ 600-50-300 MG tablet TAKE 1 TABLET BY MOUTH DAILY 30 tablet 5   XARELTO 20 MG TABS tablet TAKE 1 TABLET(20 MG) BY MOUTH DAILY WITH SUPPER 90 tablet 0   zolpidem (AMBIEN) 10 MG tablet Take 10 mg by mouth at bedtime.     No current facility-administered medications for this visit.    Allergies:   Aspirin, Efavirenz, Ibuprofen, Nsaids, Pravastatin, and Lipitor [atorvastatin calcium]    Social History:  The patient  reports that he quit smoking about 7 years ago. His smoking use included cigars and cigarettes. He has a 1.00 pack-year smoking history. He has never used smokeless tobacco. He reports that he does not drink alcohol and does not use drugs.   Family History:  The patient's family history includes COPD in his mother; Depression in his brother; Throat cancer in his brother.    ROS:   Please see the history of present illness.   (+)  Mechanical falls (+) Imbalance (+) Bilateral LE edema (+) Bilateral knee pain (+) Urinary retention (+) Fatigue (+) Memory loss    All other systems are reviewed and negative.    PHYSICAL EXAM: VS:  BP 122/60 (BP Location: Right Arm, Patient Position: Sitting, Cuff Size: Large)   Pulse (!) 107   Ht 5' 10" (1.778 m)   Wt 244 lb (110.7 kg)   BMI 35.01 kg/m  , BMI Body mass index is 35.01 kg/m. GENERAL:  Well appearing.  No acute distress. HEENT: Pupils equal round and reactive, fundi not visualized, oral mucosa unremarkable NECK:  No jugular venous distention, waveform within normal limits, carotid upstroke brisk and symmetric, no bruits, no thyromegaly LUNGS:  Clear to auscultation bilaterally HEART:  RRR.  PMI not displaced or sustained,S1 and S2 within normal limits, no S3, no S4, no clicks, no rubs, no murmurs ABD:  Flat, positive bowel sounds normal in frequency in pitch, no bruits, no rebound, no guarding, no midline pulsatile mass, no hepatomegaly, no splenomegaly EXT:  2 plus pulses throughout, 1+ LE edema, no cyanosis no clubbing SKIN:  No rashes no nodules NEURO:  Cranial nerves II through XII grossly intact, motor grossly intact throughout PSYCH:  Cognitively intact, oriented to person place and time  EKG:   EKG is personally reviewed. 10/08/2021:  EKG was not ordered. 01/04/2020: Sinus rhythm.  Rate 85 bpm.  Incomplete right bundle branch block. 11/22/2018: Sinus rhythm.  Rate 82 bpm.  Incomplete right bundle branch block. 03/24/18: Sinus rhythm.  Rate 97 bpm.  Occasional PVCs.   03/16/15: Sinus rhythm.  Rate 90 bpm.    Echo  01/31/2020: Sonographer Comments: Technically difficult study due to poor echo windows  and patient is morbidly obese. Image acquisition challenging due to  patient body habitus.  IMPRESSIONS    1. Left ventricular ejection fraction, by estimation, is 60 to 65%. The  left ventricle has normal function. The left ventricle has no regional  wall  motion abnormalities. There is mild left ventricular hypertrophy.  Left ventricular diastolic parameters  are consistent with Grade I diastolic dysfunction (impaired relaxation).   2. Right ventricular systolic function is normal. The right ventricular  size is normal.   3. The mitral valve is grossly normal. No evidence of mitral valve  regurgitation.   4. The aortic valve was not well visualized. Aortic valve regurgitation  is not visualized.   5. Aortic dilatation noted. There is borderline dilatation of the aortic  root, measuring 39 mm.   6. The inferior vena cava is normal in size with greater than 50%  respiratory variability, suggesting right atrial pressure of 3 mmHg.   Comparison(s): A prior study was performed on 04/05/2015. No significant  change from prior study.   Lexiscan Myoview 03/2018: Normal perfusion NO ischemia or scar Nuclear stress EF: 59%. There was no ST segment deviation noted during stress. The study is normal. This is a low risk study.  30 day Event Monitor 07/30/15: Quality: Fair.  Baseline artifact.  Sinus rhythm and sinus tachycardia noted during an episode of syncope.  Echo 04/05/15: Study Conclusions   - Left ventricle: The cavity size was normal. Wall thickness was   increased in a pattern of mild LVH. Systolic function was normal.   The estimated ejection fraction was in the range of 60% to 65%.   Biplane speckle tracking LVEF was not accurately measured and   therefore not reported. Wall motion was normal; there were no   regional wall motion abnormalities. Left ventricular diastolic   function parameters were normal for the patient&'s age. - Aortic valve: Mildly calcified annulus. Trileaflet. - Mitral valve: Calcified annulus. There was trivial regurgitation. - Right atrium: Central venous pressure (est): 3 mm Hg. - Tricuspid valve: There was physiologic regurgitation. - Pulmonary arteries: Systolic pressure could not be accurately    estimated. - Pericardium, extracardiac: There was no pericardial effusion.  Carotid Doppler 03/23/15: IMPRESSION: 1. Trace smooth heterogeneous plaque in the distal left common carotid artery. 2. No evidence of internal carotid plaque or stenosis bilaterally. 3. Vertebral arteries remain patent with normal antegrade flow.   Recent Labs: 03/26/2021: ALT 43; BUN 28; Creatinine, Ser 1.68; Potassium 4.0; Sodium 137    Lipid Panel    Component Value Date/Time   CHOL 215 (H) 03/26/2021 1050   TRIG 222 (H) 03/26/2021 1050   HDL 39 (L) 03/26/2021 1050   CHOLHDL 5.5 (H) 03/26/2021 1050   CHOLHDL 3.3 08/09/2015 0956   VLDL 22 08/09/2015 0956   LDLCALC 136 (H) 03/26/2021 1050    Wt Readings from Last 3 Encounters:  10/08/21 244 lb (110.7 kg)  09/19/21 248 lb 9.6 oz (112.8 kg)  08/12/21 241 lb (109.3 kg)     ASSESSMENT AND PLAN:  Essential hypertension Blood pressure was initially elevated but better on repeat.  He isn't on any antihypertensives other than furosemide. Continue to monitor.  OSA (obstructive sleep apnea) Continue CPAP.  He is using it regularly.  Critical lower limb ischemia He has a history of PAD and a toe amputation.  It has been a while since we have had ABIs or arterial Dopplers.  He denies claudication but he also is not very physically active.  We will repeat ABIs and Dopplers.  Continue Repatha, Zetia, and Xarelto.  DOE (dyspnea on exertion) He has chronic exertional dyspnea.  Echo is at stress test have repeatedly been unremarkable.  He does have some very mild lower extremity edema and notes poor output to Lasix.  We will check a BNP  and a CMP today.  Continue Lasix as currently dosed for now.   Disposition:   FU with  C. [place], MD, FACC in 6 months  Medication Adjustments/Labs and Tests Ordered: Current medicines are reviewed at length with the patient today.  Concerns regarding medicines are outlined above.   Orders Placed This Encounter   Procedures   Lipid panel   Comp Met (CMET)   B Nat Peptide   VAS US LOWER EXTREMITY ARTERIAL DUPLEX   VAS US ABI WITH/WO TBI   No orders of the defined types were placed in this encounter.  Patient Instructions  Medication Instructions:  Continue current medications  *If you need a refill on your cardiac medications before your next appointment, please call your pharmacy*   Lab Work: Fasting Lipids, CMP, BNP  If you have labs (blood work) drawn today and your tests are completely normal, you will receive your results only by: MyChart Message (if you have MyChart) OR A paper copy in the mail If you have any lab test that is abnormal or we need to change your treatment, we will call you to review the results.   Testing/Procedures: Your physician has requested that you have a lower extremity arterial duplex. This test is an ultrasound of the arteries in the legs or arms. It looks at arterial blood flow in the legs and arms. Allow one hour for Lower and Upper Arterial scans. There are no restrictions or special instructions  Your physician has requested that you have an ankle brachial index (ABI). During this test an ultrasound and blood pressure cuff are used to evaluate the arteries that supply the arms and legs with blood. Allow thirty minutes for this exam. There are no restrictions or special instructions.    Follow-Up: At [facility], you and your health needs are our priority.  As part of our continuing mission to provide you with exceptional heart care, we have created designated Provider Care Teams.  These Care Teams include your primary Cardiologist (physician) and Advanced Practice Providers (APPs -  Physician Assistants and Nurse Practitioners) who all work together to provide you with the care you need, when you need it.  We recommend signing up for the patient portal called "MyChart".  Sign up information is provided on this After Visit Summary.  MyChart is used to  connect with patients for Virtual Visits (Telemedicine).  Patients are able to view lab/test results, encounter notes, upcoming appointments, etc.  Non-urgent messages can be sent to your provider as well.   To learn more about what you can do with MyChart, go to [email or web address].    Your next appointment:   6 month(s)  The format for your next appointment:   In Person  Provider:    [place], MD    Other Instructions   Important Information About Sugar         I,Mathew Stumpf,acting as a scribe for  [place], MD.,have documented all relevant documentation on the behalf of  [place], MD,as directed by   [place], MD while in the presence of  [place], MD.  I,  C. [place], MD have reviewed all documentation for this visit.  The documentation of the exam, diagnosis, procedures, and orders on 10/08/2021 are all accurate and complete. .  Signed,  C. [place], MD, FACC  10/08/2021 4:33 PM    [facility] 

## 2021-10-08 NOTE — Assessment & Plan Note (Signed)
Continue CPAP.  He is using it regularly.

## 2021-10-08 NOTE — Assessment & Plan Note (Addendum)
Blood pressure was initially elevated but better on repeat.  He isn't on any antihypertensives other than furosemide. Continue to monitor.

## 2021-10-08 NOTE — Assessment & Plan Note (Signed)
He has a history of PAD and a toe amputation.  It has been a while since we have had ABIs or arterial Dopplers.  He denies claudication but he also is not very physically active.  We will repeat ABIs and Dopplers.  Continue Repatha, Zetia, and Xarelto.

## 2021-10-09 LAB — COMPREHENSIVE METABOLIC PANEL
ALT: 27 IU/L (ref 0–44)
AST: 19 IU/L (ref 0–40)
Albumin/Globulin Ratio: 1.6 (ref 1.2–2.2)
Albumin: 4.7 g/dL (ref 3.9–4.9)
Alkaline Phosphatase: 124 IU/L — ABNORMAL HIGH (ref 44–121)
BUN/Creatinine Ratio: 12 (ref 10–24)
BUN: 21 mg/dL (ref 8–27)
Bilirubin Total: 0.8 mg/dL (ref 0.0–1.2)
CO2: 21 mmol/L (ref 20–29)
Calcium: 9.6 mg/dL (ref 8.6–10.2)
Chloride: 105 mmol/L (ref 96–106)
Creatinine, Ser: 1.73 mg/dL — ABNORMAL HIGH (ref 0.76–1.27)
Globulin, Total: 2.9 g/dL (ref 1.5–4.5)
Glucose: 97 mg/dL (ref 70–99)
Potassium: 4 mmol/L (ref 3.5–5.2)
Sodium: 144 mmol/L (ref 134–144)
Total Protein: 7.6 g/dL (ref 6.0–8.5)
eGFR: 42 mL/min/{1.73_m2} — ABNORMAL LOW (ref >59)

## 2021-10-09 LAB — LIPID PANEL
Chol/HDL Ratio: 2 ratio (ref 0.0–5.0)
Cholesterol, Total: 100 mg/dL (ref 100–199)
HDL: 49 mg/dL (ref >39)
LDL Chol Calc (NIH): 33 mg/dL (ref 0–99)
Triglycerides: 95 mg/dL (ref 0–149)
VLDL Cholesterol Cal: 18 mg/dL (ref 5–40)

## 2021-10-09 LAB — BRAIN NATRIURETIC PEPTIDE: BNP: 5.2 pg/mL (ref 0.0–100.0)

## 2021-10-10 DIAGNOSIS — E0849 Diabetes mellitus due to underlying condition with other diabetic neurological complication: Secondary | ICD-10-CM | POA: Diagnosis not present

## 2021-10-10 DIAGNOSIS — R55 Syncope and collapse: Secondary | ICD-10-CM | POA: Diagnosis not present

## 2021-10-10 DIAGNOSIS — E876 Hypokalemia: Secondary | ICD-10-CM | POA: Diagnosis not present

## 2021-10-10 DIAGNOSIS — N179 Acute kidney failure, unspecified: Secondary | ICD-10-CM | POA: Diagnosis not present

## 2021-10-10 DIAGNOSIS — Z6836 Body mass index (BMI) 36.0-36.9, adult: Secondary | ICD-10-CM | POA: Diagnosis not present

## 2021-10-10 DIAGNOSIS — I10 Essential (primary) hypertension: Secondary | ICD-10-CM | POA: Diagnosis not present

## 2021-10-10 DIAGNOSIS — R399 Unspecified symptoms and signs involving the genitourinary system: Secondary | ICD-10-CM | POA: Diagnosis not present

## 2021-10-10 DIAGNOSIS — Z794 Long term (current) use of insulin: Secondary | ICD-10-CM | POA: Diagnosis not present

## 2021-10-10 DIAGNOSIS — B20 Human immunodeficiency virus [HIV] disease: Secondary | ICD-10-CM | POA: Diagnosis not present

## 2021-10-11 DIAGNOSIS — R3911 Hesitancy of micturition: Secondary | ICD-10-CM | POA: Diagnosis not present

## 2021-10-11 DIAGNOSIS — N3943 Post-void dribbling: Secondary | ICD-10-CM | POA: Diagnosis not present

## 2021-10-11 DIAGNOSIS — N401 Enlarged prostate with lower urinary tract symptoms: Secondary | ICD-10-CM | POA: Diagnosis not present

## 2021-10-11 DIAGNOSIS — R351 Nocturia: Secondary | ICD-10-CM | POA: Diagnosis not present

## 2021-10-11 DIAGNOSIS — R3914 Feeling of incomplete bladder emptying: Secondary | ICD-10-CM | POA: Diagnosis not present

## 2021-10-11 DIAGNOSIS — R350 Frequency of micturition: Secondary | ICD-10-CM | POA: Diagnosis not present

## 2021-10-14 ENCOUNTER — Other Ambulatory Visit: Payer: Self-pay | Admitting: Internal Medicine

## 2021-10-14 DIAGNOSIS — B20 Human immunodeficiency virus [HIV] disease: Secondary | ICD-10-CM

## 2021-10-16 ENCOUNTER — Ambulatory Visit (HOSPITAL_COMMUNITY)
Admission: RE | Admit: 2021-10-16 | Discharge: 2021-10-16 | Disposition: A | Discharge status: Home / Self Care | Payer: PPO | Source: Ambulatory Visit | Attending: Cardiovascular Disease | Admitting: Cardiovascular Disease

## 2021-10-16 ENCOUNTER — Telehealth: Payer: Self-pay | Admitting: Cardiovascular Disease

## 2021-10-16 ENCOUNTER — Telehealth (HOSPITAL_BASED_OUTPATIENT_CLINIC_OR_DEPARTMENT_OTHER): Payer: Self-pay

## 2021-10-16 DIAGNOSIS — I70229 Atherosclerosis of native arteries of extremities with rest pain, unspecified extremity: Secondary | ICD-10-CM | POA: Insufficient documentation

## 2021-10-16 DIAGNOSIS — I70221 Atherosclerosis of native arteries of extremities with rest pain, right leg: Secondary | ICD-10-CM | POA: Diagnosis not present

## 2021-10-16 NOTE — Telephone Encounter (Addendum)
Called results to patient and left results on VM (ok per DPR), instructions left to call office back if patient has any questions!       ----- Message from Loel Dubonnet, NP sent at 10/16/2021 10:37 AM EDT ----- Stable kidney function. Normal electrolytes, liver function.  Alkaline phosphatase mildly elevated-reduce intake of fried foods, alcohol, acetaminophen.  BNP with no significant volume overload.  Cholesterol panels look much improved!    Due to reported LE edema during recent clinic visit may increase Lasix to BID x 2 days then return to daily dosing.

## 2021-10-16 NOTE — Telephone Encounter (Signed)
Left message for patient to call back       ----- Message from Loel Dubonnet, NP sent at 10/16/2021 10:37 AM EDT ----- Stable kidney function. Normal electrolytes, liver function.  Alkaline phosphatase mildly elevated-reduce intake of fried foods, alcohol, acetaminophen.  BNP with no significant volume overload.  Cholesterol panels look much improved!     Due to reported LE edema during recent clinic visit may increase Lasix to BID x 2 days then return to daily dosing.

## 2021-10-16 NOTE — Telephone Encounter (Signed)
Pt  returning nurses call. He did not understand message that was left. Please advise

## 2021-10-17 NOTE — Telephone Encounter (Signed)
Left message for patient to call back        "----- Message from Loel Dubonnet, NP sent at 10/16/2021 10:37 AM EDT ----- Stable kidney function. Normal electrolytes, liver function.  Alkaline phosphatase mildly elevated-reduce intake of fried foods, alcohol, acetaminophen.  BNP with no significant volume overload.  Cholesterol panels look much improved!     Due to reported LE edema during recent clinic visit may increase Lasix to BID x 2 days then return to daily dosing."

## 2021-10-18 NOTE — Telephone Encounter (Signed)
3rd call attempt no answer, left message

## 2021-10-23 ENCOUNTER — Other Ambulatory Visit: Payer: Self-pay

## 2021-10-23 DIAGNOSIS — Z794 Long term (current) use of insulin: Secondary | ICD-10-CM

## 2021-10-23 MED ORDER — BD PEN NEEDLE SHORT U/F 31G X 8 MM MISC: 2 refills | Status: DC

## 2021-11-01 ENCOUNTER — Telehealth: Payer: Self-pay | Admitting: Pharmacist

## 2021-11-01 NOTE — Progress Notes (Signed)
Chronic Care Management Pharmacy Assistant   Name: Daniel [name]  MRN: [id number] DOB: [birth year]   Reason for Encounter: Disease State - Hypertension Call     Recent office visits:  None noted.   Recent consult visits:  10/08/21 Skeet Latch MD - Cardiology - Dyslipidemia - Labs were ordered. [name] ordered. Follow up in 6 months.   09/19/21 Philemon Kingdom MD - Diabetes - Labs were ordered. No medication changes. Follow up in 3-4 months.  Hospital visits: 07/21/21  Medication Reconciliation was completed by comparing discharge summary, patient's EMR and Pharmacy list, and upon discussion with patient.  Admitted to the hospital on 07/21/21 due to Suture removal. Discharge date was 07/21/21. Discharged from [facility].  New?Medications Started at [facility] Discharge:?? None noted  Medication Changes at Hospital Discharge: None noted  Medications Discontinued at Hospital Discharge: None noted  Medications that remain the same after Hospital Discharge:??  All other medications will remain the same.     Hospital visits: 07/15/21 Medication Reconciliation was completed by comparing discharge summary, patient's EMR and Pharmacy list, and upon discussion with patient.  Admitted to the hospital on 07/15/21 due to Laceration of forehead. Discharge date was 07/15/21. Discharged from [facility].    New?Medications Started at [facility] Discharge:?? None noted  Medication Changes at Hospital Discharge: None noted  Medications Discontinued at Hospital Discharge: None noted  Medications that remain the same after Hospital Discharge:??  All other medications will remain the same.   Medications: Outpatient Encounter Medications as of 11/01/2021  Medication Sig Note   acetaminophen (TYLENOL) 500 MG tablet Take 1,500 mg by mouth in the morning and at bedtime.    ALPRAZolam (XANAX) 1 MG tablet Take 1 mg by mouth at bedtime. *May take one tablet up to 4 times daily  as needed for anxiety    amphetamine-dextroamphetamine (ADDERALL) 30 MG tablet Take 30 mg by mouth 3 (three) times daily. 02/20/2021: Patient 40m per day   brexpiprazole (REXULTI) 1 MG TABS tablet Take 1 mg by mouth at bedtime.    Continuous Blood Gluc Receiver (FREESTYLE LIBRE 2 READER) DEVI Use as instructed to check blood sugars.    dapagliflozin propanediol (FARXIGA) 10 MG TABS tablet Take 1 tablet (10 mg total) by mouth daily before breakfast.    Dextromethorphan-buPROPion ER (AUVELITY) 45-105 MG TBCR Take 1 tablet by mouth daily at 12 noon.    diclofenac Sodium (VOLTAREN) 1 % GEL APPLY 2 GM TO EACH KNEE EVERY MORNING AND EVERY NIGHT AT BEDTIME, AND APPLY 1 GM TO EACH KNEE EVERY AFTERNOON    divalproex (DEPAKOTE ER) 500 MG 24 hr tablet Take one tablet at bedtime    Dulaglutide (TRULICITY) 4.5 MGL/8.7FISOPN Inject 4.5 mg as directed once a week.    Evolocumab (REPATHA SURECLICK) 1433MG/ML SOAJ Inject 140 mg into the skin every 14 (fourteen) days.    ezetimibe (ZETIA) 10 MG tablet TAKE 1 TABLET(10 MG) BY MOUTH DAILY    furosemide (LASIX) 40 MG tablet TAKE 1 TABLET(40 MG) BY MOUTH DAILY    insulin aspart (NOVOLOG FLEXPEN) 100 UNIT/ML FlexPen 15 units with breakfast and 25 units with supper.  And pen needles 3/day    insulin glargine (LANTUS SOLOSTAR) 100 UNIT/ML Solostar Pen Inject 100 Units into the skin every morning.    Insulin Pen Needle (B-D ULTRAFINE III SHORT PEN) 31G X 8 MM MISC USE AS DIRECTED THREE TIMES DAILY    levothyroxine (SYNTHROID, LEVOTHROID) 50 MCG tablet Take 1 tablet (  50 mcg total) by mouth at bedtime.    ondansetron (ZOFRAN) 8 MG tablet TAKE 1 TABLET BY MOUTH EVERY 8 HOURS AS NEEDED FOR NAUSEA OR VOMITING    pantoprazole (PROTONIX) 40 MG tablet TAKE 1 TABLET(40 MG) BY MOUTH DAILY    pregabalin (LYRICA) 300 MG capsule Take 1 capsule (300 mg total) by mouth 2 (two) times daily.    protriptyline (VIVACTIL) 10 MG tablet Take 10 mg by mouth 3 (three) times daily.     silodosin  (RAPAFLO) 8 MG CAPS capsule Take 8 mg by mouth daily.    TRIUMEQ 600-50-300 MG tablet TAKE 1 TABLET BY MOUTH DAILY    XARELTO 20 MG TABS tablet TAKE 1 TABLET(20 MG) BY MOUTH DAILY WITH SUPPER    zolpidem (AMBIEN) 10 MG tablet Take 10 mg by mouth at bedtime.    No facility-administered encounter medications on file as of 11/01/2021.    Current antihypertensive regimen:  Amlodipine 63m daily Silodosin (RAPAFLO) 8 MG CAPS capsule 1 daily    How often are you checking your Blood Pressure?   Patient reported he has not been checking his blood pressures at home lately.   Current home BP readings:      What recent interventions/DTPs have been made by any provider to improve Blood Pressure control since last CPP Visit:   Patient does not have any recent changes to his current medication regimen.      Any recent hospitalizations or ED visits since last visit with CPP?  Patient had ED visits for head laceration and suture removal on 07/15/21 and 07/21/21     What diet changes have been made to improve Blood Pressure Control?   Patient reported      What exercise is being done to improve your Blood Pressure Control?   Patient reported    Adherence Review: Is the patient currently on ACE/ARB medication? No Does the patient have >5 day gap between last estimated fill dates? No     Care Gaps   AWV: needed  Colonoscopy: due 05/04/28 DM Eye Exam: overdue 02/05/21 DM Foot Exam: due 10/08/21 Microalbumin: done 12/05/20 HbgAIC: done 09/19/21 (6.6) DEXA: N/A Mammogram: N/A     Star Rating Drugs: No Star Rating Drugs noted.   Future Appointments  Date Time Provider DStuart 01/21/2022  3:20 PM GPhilemon Kingdom MD [facility] None  02/05/2022  2:00 PM [facility] PHARMACIST [facility]  06/02/2022  2:30 PM MWard Givens NP [facility] None   Multiple attempts were made to contact patient. Attempts were unsuccessful. / ls,CMA   LJobe Gibbon CComstock ParkPharmacist  Assistant  [phone]

## 2021-11-07 DIAGNOSIS — G4733 Obstructive sleep apnea (adult) (pediatric): Secondary | ICD-10-CM | POA: Diagnosis not present

## 2021-11-11 DIAGNOSIS — N401 Enlarged prostate with lower urinary tract symptoms: Secondary | ICD-10-CM | POA: Diagnosis not present

## 2021-11-11 DIAGNOSIS — R3914 Feeling of incomplete bladder emptying: Secondary | ICD-10-CM | POA: Diagnosis not present

## 2021-11-11 DIAGNOSIS — R972 Elevated prostate specific antigen [PSA]: Secondary | ICD-10-CM | POA: Diagnosis not present

## 2021-11-13 ENCOUNTER — Other Ambulatory Visit: Payer: Self-pay | Admitting: Family Medicine

## 2021-11-13 DIAGNOSIS — E1142 Type 2 diabetes mellitus with diabetic polyneuropathy: Secondary | ICD-10-CM

## 2021-11-13 DIAGNOSIS — M792 Neuralgia and neuritis, unspecified: Secondary | ICD-10-CM

## 2021-11-14 ENCOUNTER — Other Ambulatory Visit: Payer: Self-pay | Admitting: Lab

## 2021-11-14 ENCOUNTER — Other Ambulatory Visit: Payer: Self-pay | Admitting: Family Medicine

## 2021-11-14 DIAGNOSIS — Z8669 Personal history of other diseases of the nervous system and sense organs: Secondary | ICD-10-CM

## 2021-11-14 NOTE — Telephone Encounter (Signed)
Patient is requesting a refill of the following medications: Requested Prescriptions   Pending Prescriptions Disp Refills   pregabalin (LYRICA) 300 MG capsule [Pharmacy Med Name: PREGABALIN 300MG CAPSULES] 60 capsule     Sig: TAKE 1 CAPSULE(300 MG) BY MOUTH TWICE DAILY    Date of patient request: 11/14/21 Last office visit: 08/12/21 Date of last refill: 08/23/21 Last refill amount: 60

## 2021-11-14 NOTE — Telephone Encounter (Signed)
Controlled substance database reviewed.  Pregabalin No. 60 filled on 10/06/2021.  Medication discussed June 19.

## 2021-11-18 ENCOUNTER — Telehealth: Payer: Self-pay | Admitting: Cardiovascular Disease

## 2021-11-18 DIAGNOSIS — I70209 Unspecified atherosclerosis of native arteries of extremities, unspecified extremity: Secondary | ICD-10-CM

## 2021-11-18 DIAGNOSIS — E785 Hyperlipidemia, unspecified: Secondary | ICD-10-CM

## 2021-11-18 MED ORDER — REPATHA SURECLICK 140 MG/ML ~~LOC~~ SOAJ: 140.0000 mg | SUBCUTANEOUS | 11 refills | Status: DC

## 2021-11-18 NOTE — Telephone Encounter (Signed)
Advised patient, verbalized understanding  

## 2021-11-18 NOTE — Telephone Encounter (Signed)
Repatha started by Pharm D, will forward for review

## 2021-11-18 NOTE — Telephone Encounter (Signed)
PA submitted for Repatha.  Key: [id number].  PA approved through 11/19/22

## 2021-11-18 NOTE — Telephone Encounter (Signed)
Pt c/o medication issue:  1. Name of Medication:  Evolocumab (REPATHA SURECLICK) 903 MG/ML SOAJ  2. How are you currently taking this medication (dosage and times per day)?  As prescribed  3. Are you having a reaction (difficulty breathing--STAT)?  No   4. What is your medication issue?   Patient states his local pharmacy [facility] [phone] - [place], [place] - [address] [name] [name]) is requesting a PA for Repatha prior to distribution.   Please assist and contact patient to confirm.

## 2021-11-18 NOTE — Telephone Encounter (Signed)
Hey this is a Dr. [name] patient that is going to need PA

## 2021-11-20 DIAGNOSIS — R3914 Feeling of incomplete bladder emptying: Secondary | ICD-10-CM | POA: Diagnosis not present

## 2021-11-28 ENCOUNTER — Encounter: Payer: PPO | Admitting: Family Medicine

## 2021-11-28 ENCOUNTER — Other Ambulatory Visit: Payer: Self-pay | Admitting: Family Medicine

## 2021-11-28 DIAGNOSIS — Z8669 Personal history of other diseases of the nervous system and sense organs: Secondary | ICD-10-CM

## 2021-12-07 DIAGNOSIS — G4733 Obstructive sleep apnea (adult) (pediatric): Secondary | ICD-10-CM | POA: Diagnosis not present

## 2021-12-09 ENCOUNTER — Telehealth: Payer: Self-pay | Admitting: *Deleted

## 2021-12-09 NOTE — Patient Outreach (Signed)
  Care Coordination   12/09/2021 Name: Daniel [name] MRN: [id number] DOB: [birth year]   Care Coordination Outreach Attempts:  An unsuccessful telephone outreach was attempted today to offer the patient information about available care coordination services as a benefit of their health plan.   Follow Up Plan:  Additional outreach attempts will be made to offer the patient care coordination information and services.   Encounter Outcome:  No Answer  Care Coordination Interventions Activated:  No   Care Coordination Interventions:  No, not indicated    Raina Mina, RN Care Management Coordinator [place] Office [phone]

## 2021-12-10 ENCOUNTER — Ambulatory Visit (INDEPENDENT_AMBULATORY_CARE_PROVIDER_SITE_OTHER): Payer: PPO | Admitting: Family Medicine

## 2021-12-10 ENCOUNTER — Telehealth: Payer: Self-pay

## 2021-12-10 DIAGNOSIS — Z23 Encounter for immunization: Secondary | ICD-10-CM

## 2021-12-10 DIAGNOSIS — N1831 Chronic kidney disease, stage 3a: Secondary | ICD-10-CM

## 2021-12-10 MED ORDER — LANTUS SOLOSTAR 100 UNIT/ML ~~LOC~~ SOPN: 100.0000 [IU] | PEN_INJECTOR | SUBCUTANEOUS | 3 refills | Status: DC

## 2021-12-10 NOTE — Progress Notes (Signed)
Pt received his Pneumococcal 23 and High dose flu vaccine's today . Tolerated injections well

## 2021-12-13 ENCOUNTER — Encounter (HOSPITAL_COMMUNITY): Payer: Self-pay

## 2021-12-13 ENCOUNTER — Ambulatory Visit (HOSPITAL_COMMUNITY)
Admission: EM | Admit: 2021-12-13 | Discharge: 2021-12-13 | Disposition: A | Discharge status: Home / Self Care | Payer: PPO | Attending: Internal Medicine | Admitting: Internal Medicine

## 2021-12-13 ENCOUNTER — Other Ambulatory Visit: Payer: Self-pay | Admitting: Family Medicine

## 2021-12-13 DIAGNOSIS — W57XXXA Bitten or stung by nonvenomous insect and other nonvenomous arthropods, initial encounter: Secondary | ICD-10-CM | POA: Diagnosis not present

## 2021-12-13 DIAGNOSIS — S80862A Insect bite (nonvenomous), left lower leg, initial encounter: Secondary | ICD-10-CM

## 2021-12-13 DIAGNOSIS — K219 Gastro-esophageal reflux disease without esophagitis: Secondary | ICD-10-CM

## 2021-12-13 DIAGNOSIS — R21 Rash and other nonspecific skin eruption: Secondary | ICD-10-CM | POA: Diagnosis not present

## 2021-12-13 MED ORDER — CEPHALEXIN 500 MG PO CAPS: 500.0000 mg | ORAL_CAPSULE | Freq: Two times a day (BID) | ORAL | 0 refills | Status: AC

## 2021-12-13 MED ORDER — CEPHALEXIN 500 MG PO CAPS: 500.0000 mg | ORAL_CAPSULE | Freq: Two times a day (BID) | ORAL | 0 refills | Status: DC

## 2021-12-13 NOTE — Discharge Instructions (Addendum)
Your left leg bites appear infected. Take Keflex antibiotic twice daily for the next 7 days to treat this infection. Continue wearing compression stockings to reduce swelling to your legs.  If you develop any new or worsening symptoms or do not improve in the next 2 to 3 days, please return.  If your symptoms are severe, please go to the emergency room.  Follow-up with your primary care provider for further evaluation and management of your symptoms as well as ongoing wellness visits.  I hope you feel better!

## 2021-12-13 NOTE — ED Triage Notes (Signed)
Pt is here for a rash on the left leg and groin  pt noticed yesterday

## 2021-12-13 NOTE — ED Provider Notes (Signed)
[facility]    CSN: [id number] Arrival date & time: 12/13/21  1640      History   Chief Complaint Chief Complaint  Patient presents with   Rash    HPI Daniel [name] is a 66 y.o. male.   Patient presents urgent care for evaluation of wounds/bites to the left calf that he first noticed yesterday.  He does not recall being bitten by any insects, fleas, or ticks.  There is redness and warmth surrounding the sites of the bites and patient is concerned that these may be infected at this time.  He reports some purulent drainage from a couple of the bites as well.  He normally wears compression stockings to the bilateral lower extremities with long pants but sometimes takes his compression stockings off if he is sleeping or walking around the house.  The wounds are somewhat itchy but are mostly mildly tender to touch.  Denies recent medication changes, exposure to new personal hygiene products, or any other poisonous plants/irritants/allergens.  He has attempted use of Neosporin ointment with pain relief product inside of the ointment and states that this has helped slightly but the site remains red and swollen.  Legs are swollen at baseline and patient denies shortness of breath, cough, fever/chills, nausea, vomiting, chest pain, dizziness, and recent falls.     Past Medical History:  Diagnosis Date   ADHD (attention deficit hyperactivity disorder)    Anxiety    Chronic kidney disease    Clotting disorder ([place])    Depression    Diabetes mellitus without complication ([place])    Diabetes mellitus, type II ([place])    Dizziness 03/17/2015   Essential hypertension 06/25/2018   GERD (gastroesophageal reflux disease)    HIV disease ([name])    HIV infection ([place])    Liver disease    OSA (obstructive sleep apnea) 07/25/2015   Uses CPAP regularly   Peripheral vascular disease ([place])    Ulcer     Patient Active Problem List   Diagnosis Date Noted   Mixed hyperlipidemia 09/19/2021    Type II diabetes mellitus with peripheral circulatory disorder ([place]) 09/19/2021   Insomnia due to mental condition 05/23/2021   Atherosclerosis of native artery of extremity ([place]) 04/25/2021   Chronic nonalcoholic liver disease 27/08/8673   History of hepatitis B 04/25/2021   HIV positive ([place]) 04/25/2021   Long term (current) use of insulin ([place]) 04/25/2021   Long term (current) use of anticoagulants 44/92/0100   Nonalcoholic steatohepatitis ([place]) 04/25/2021   Type 2 diabetes mellitus with other circulatory complications ([place]) 71/21/9758   Type 2 diabetes mellitus with peripheral angiopathy ([place]) 04/25/2021   Abnormal liver function tests 04/25/2021   Drug-induced myopathy 02/12/2021   Unilateral primary osteoarthritis, right knee 12/15/2019   Generalized weakness 09/14/2019   Severe major depression ([place]) 11/17/2018   Class 2 obesity due to excess calories without serious comorbidity with body mass index (BMI) of 36.0 to 36.9 in adult 11/17/2018   Anticoagulated 08/24/2018   Dyslipidemia 07/27/2018   Essential hypertension 06/25/2018   Hypogonadism in male 05/10/2018   Numbness 03/31/2018   Diabetic peripheral neuropathy ([place]) 01/25/2018   S/P transmetatarsal amputation of foot, right ([place]) 10/09/2017   Hypothyroidism    Constipation    Obesity, Class III, BMI 40-49.9 (morbid obesity) ([place]) 01/15/2017   DOE (dyspnea on exertion) 01/13/2017   Imbalance 11/18/2016   Osteomyelitis of right foot ([place]) 09/22/2016   Chronic migraine 09/17/2016   Gait abnormality 09/17/2016  Fall 12/05/2015   Toe ulcer, right ([place]) 09/19/2015   Decreased pedal pulses 09/19/2015   OSA on CPAP 09/05/2015   Major depressive disorder, recurrent episode, moderate ([place]) 09/05/2015   Recurrent falls while walking 06/24/2015   H/O migraine 06/22/2015   Sinus headache 06/21/2015   OSA (obstructive sleep apnea) 06/20/2015   History of DVT (deep vein thrombosis) 06/20/2015   History of falling  06/20/2015   Chronic, continuous use of opioids 06/20/2015   Coagulopathy ([place]) 06/20/2015   Elevated CPK 06/20/2015   Pain syndrome, chronic 06/20/2015   Thrombocythemia 06/20/2015   Uncontrolled type 2 diabetes mellitus with hyperglycemia ([place]) 06/20/2015   Low back pain 06/11/2015   Unspecified abnormalities of gait and mobility 06/11/2015   Benign paroxysmal positional vertigo of right ear 05/01/2015   Chronic kidney disease, stage III (moderate) ([place]) 08/09/2014   Renal insufficiency 08/09/2014   Insulin-requiring or dependent type II diabetes mellitus ([place]) 11/07/2013   Diabetes mellitus ([place]) 11/07/2013   Steatosis of liver 09/09/2010   Human immunodeficiency virus (HIV) infection ([place]) 06/04/2006   Herpes zoster 06/04/2006   Depression 06/04/2006   THROMBOPHLEBITIS NOS 06/04/2006   Gastroesophageal reflux disease 06/04/2006   ARTHRITIS, HAND 06/04/2006   Attention deficit hyperactivity disorder 06/04/2006   Abnormal blood chemistry level 06/04/2006    Past Surgical History:  Procedure Laterality Date   AMPUTATION Right 10/02/2017   Procedure: RIGHT TRANSMETATARSAL AMPUTATION;  Surgeon: Leandrew Koyanagi, MD;  Location: [facility];  Service: Orthopedics;  Laterality: Right;   SMALL INTESTINE SURGERY     STOMACH SURGERY     TOE AMPUTATION Right 08/2016   right great toe       Home Medications    Prior to Admission medications   Medication Sig Start Date End Date Taking? Authorizing Provider  acetaminophen (TYLENOL) 500 MG tablet Take 1,500 mg by mouth in the morning and at bedtime.    [provider]  ALPRAZolam Duanne Moron) 1 MG tablet Take 1 mg by mouth at bedtime. *May take one tablet up to 4 times daily as needed for anxiety    [provider]  amphetamine-dextroamphetamine (ADDERALL) 30 MG tablet Take 30 mg by mouth 3 (three) times daily.    [provider]  brexpiprazole (REXULTI) 1 MG TABS tablet Take 1 mg by mouth at bedtime.    [provider]  cephALEXin (KEFLEX) 500 MG capsule Take 1 capsule (500 mg total) by mouth 2 (two) times daily for 7 days. 12/13/21 12/20/21  Talbot Grumbling, FNP  Continuous Blood Gluc Receiver (FREESTYLE LIBRE 2 READER) DEVI Use as instructed to check blood sugars. 04/18/21   Renato Shin, MD  dapagliflozin propanediol (FARXIGA) 10 MG TABS tablet Take 1 tablet (10 mg total) by mouth daily before breakfast. 04/18/21   Renato Shin, MD  Dextromethorphan-buPROPion ER (AUVELITY) 45-105 MG TBCR Take 1 tablet by mouth daily at 12 noon.    [provider]  diclofenac Sodium (VOLTAREN) 1 % GEL APPLY 2 GM TO EACH KNEE EVERY MORNING AND EVERY NIGHT AT BEDTIME, AND APPLY 1 GM TO EACH KNEE EVERY AFTERNOON 08/07/21   [name], MD  divalproex (DEPAKOTE ER) 500 MG 24 hr tablet TAKE 1 TABLET BY MOUTH AT BEDTIME 11/28/21   [name], MD  Dulaglutide (TRULICITY) 4.5 DU/2.0UR SOPN Inject 4.5 mg as directed once a week. 09/17/21   Philemon Kingdom, MD  Evolocumab (REPATHA SURECLICK) 427 MG/ML SOAJ Inject 140 mg into the skin every 14 (fourteen) days. 11/18/21  Skeet Latch, MD  ezetimibe (ZETIA) 10 MG tablet TAKE 1 TABLET(10 MG) BY MOUTH DAILY 02/20/21   Skeet Latch, MD  furosemide (LASIX) 40 MG tablet TAKE 1 TABLET(40 MG) BY MOUTH DAILY 09/13/21   [name], MD  insulin aspart (NOVOLOG FLEXPEN) 100 UNIT/ML FlexPen 15 units with breakfast and 25 units with supper.  And pen needles 3/day 04/18/21   Renato Shin, MD  insulin glargine (LANTUS SOLOSTAR) 100 UNIT/ML Solostar Pen Inject 100 Units into the skin every morning. 12/10/21   Philemon Kingdom, MD  Insulin Pen Needle (B-D ULTRAFINE III SHORT PEN) 31G X 8 MM MISC USE AS DIRECTED THREE TIMES DAILY 10/23/21   Philemon Kingdom, MD  levothyroxine (SYNTHROID, LEVOTHROID) 50 MCG tablet Take 1 tablet (50 mcg total) by mouth at bedtime. 07/24/15   [name], MD  ondansetron (ZOFRAN) 8 MG tablet TAKE 1 TABLET BY MOUTH EVERY  8 HOURS AS NEEDED FOR NAUSEA OR VOMITING 07/09/21   [name], MD  pantoprazole (PROTONIX) 40 MG tablet TAKE 1 TABLET(40 MG) BY MOUTH DAILY 06/18/21   [name], MD  pregabalin (LYRICA) 300 MG capsule TAKE 1 CAPSULE(300 MG) BY MOUTH TWICE DAILY 11/14/21   [name], MD  protriptyline (VIVACTIL) 10 MG tablet Take 10 mg by mouth 3 (three) times daily.  01/30/16   [provider]  silodosin (RAPAFLO) 8 MG CAPS capsule Take 8 mg by mouth daily.    [provider]  TRIUMEQ 600-50-300 MG tablet TAKE 1 TABLET BY MOUTH DAILY 10/14/21   Comer, Okey Regal, MD  XARELTO 20 MG TABS tablet TAKE 1 TABLET(20 MG) BY MOUTH DAILY WITH SUPPER 09/04/21   [name], MD  zolpidem (AMBIEN) 10 MG tablet Take 10 mg by mouth at bedtime.    [provider]    Family History Family History  Problem Relation Age of Onset   Depression Brother    Throat cancer Brother        half brother, never smoker   COPD Mother    Diabetes Neg Hx     Social History Social History   Tobacco Use   Smoking status: Former    Packs/day: 0.10    Years: 10.00    Total pack years: 1.00    Types: Cigars, Cigarettes    Quit date: 08/09/2014    Years since quitting: 7.3   Smokeless tobacco: Never  Vaping Use   Vaping Use: Never used  Substance Use Topics   Alcohol use: No    Alcohol/week: 0.0 standard drinks of alcohol   Drug use: No     Allergies   Aspirin, Efavirenz, Ibuprofen, Nsaids, Pravastatin, and Lipitor [atorvastatin calcium]   Review of Systems Review of Systems Per HPI  Physical Exam Triage Vital Signs ED Triage Vitals  Enc Vitals Group     BP 12/13/21 1751 119/61     Pulse Rate 12/13/21 1751 77     Resp 12/13/21 1751 12     Temp 12/13/21 1751 98.8 F (37.1 C)     Temp Source 12/13/21 1751 Oral     SpO2 12/13/21 1751 98 %     Weight --      Height --      Head Circumference --      Peak Flow --      Pain Score 12/13/21 1749 0     Pain Loc --       Pain Edu? --      Excl.  in [place]? --    No data found.  Updated Vital Signs BP 119/61 (BP Location: Right Arm)   Pulse 77   Temp 98.8 F (37.1 C) (Oral)   Resp 12   SpO2 98%   Visual Acuity Right Eye Distance:   Left Eye Distance:   Bilateral Distance:    Right Eye Near:   Left Eye Near:    Bilateral Near:     Physical Exam Vitals and nursing note reviewed.  Constitutional:      Appearance: He is not ill-appearing or toxic-appearing.  HENT:     Head: Normocephalic and atraumatic.     Right Ear: Hearing and external ear normal.     Left Ear: Hearing and external ear normal.     Nose: Nose normal.     Mouth/Throat:     Lips: Pink.  Eyes:     General: Lids are normal. Vision grossly intact. Gaze aligned appropriately.     Extraocular Movements: Extraocular movements intact.     Conjunctiva/sclera: Conjunctivae normal.  Pulmonary:     Effort: Pulmonary effort is normal.  Musculoskeletal:     Cervical back: Neck supple.     Right lower leg: Edema present.     Left lower leg: Edema present.     Comments: +1 pitting edema to the bilateral lower extremities.  Patient states that this is baseline for him and he continues taking Lasix per PCP/wearing compression stockings to relieve the swelling.  Skin:    General: Skin is warm and dry.     Capillary Refill: Capillary refill takes less than 2 seconds.     Findings: Rash present.     Comments: 3-4 pustular wounds present to the left medial calf.  See image below for further detail.  There is a surrounding area of erythema and warmth present to the wounds with some drainage.  +2 DP pulses bilaterally.  Sensation intact distal to rash.  Neurological:     General: No focal deficit present.     Mental Status: He is alert and oriented to person, place, and time. Mental status is at baseline.     Cranial Nerves: No dysarthria or facial asymmetry.  Psychiatric:        Mood and Affect: Mood normal.        Speech: Speech normal.         Behavior: Behavior normal.        Thought Content: Thought content normal.        Judgment: Judgment normal.       UC Treatments / Results  Labs (all labs ordered are listed, but only abnormal results are displayed) Labs Reviewed - No data to display  EKG   Radiology No results found.  Procedures Procedures (including critical care time)  Medications Ordered in UC Medications - No data to display  Initial Impression / Assessment and Plan / UC Course  I have reviewed the triage vital signs and the nursing notes.  Pertinent labs & imaging results that were available during my care of the patient were reviewed by me and considered in my medical decision making (see chart for details).   1.  Insect bite of lower leg Unclear etiology as cause of rash/insect bites, although these do appear to be infected.  Keflex antibiotic twice daily for the next 7 days to treat infection.  Compression stockings to reduce swelling to the legs and leg elevation advised.  Recommend continuation of Lasix as prescribed by PCP to reduce  leg swelling as well.  Advised to return to urgent care if symptoms fail to improve in the next 2 to 3 days while using antibiotic therapy.   Discussed physical exam and available lab work findings in clinic with patient.  Counseled patient regarding appropriate use of medications and potential side effects for all medications recommended or prescribed today. Discussed red flag signs and symptoms of worsening condition,when to call the PCP office, return to urgent care, and when to seek higher level of care in the emergency department. Patient verbalizes understanding and agreement with plan. All questions answered. Patient discharged in stable condition.    Final Clinical Impressions(s) / UC Diagnoses   Final diagnoses:  Rash and nonspecific skin eruption  Insect bite of left lower leg, initial encounter     Discharge Instructions      Your left leg bites  appear infected. Take Keflex antibiotic twice daily for the next 7 days to treat this infection. Continue wearing compression stockings to reduce swelling to your legs.  If you develop any new or worsening symptoms or do not improve in the next 2 to 3 days, please return.  If your symptoms are severe, please go to the emergency room.  Follow-up with your primary care provider for further evaluation and management of your symptoms as well as ongoing wellness visits.  I hope you feel better!   ED Prescriptions     Medication Sig Dispense Auth. Provider   cephALEXin (KEFLEX) 500 MG capsule  (Status: Discontinued) Take 1 capsule (500 mg total) by mouth 2 (two) times daily for 7 days. 14 capsule Joella Prince M, FNP   cephALEXin (KEFLEX) 500 MG capsule Take 1 capsule (500 mg total) by mouth 2 (two) times daily for 7 days. 14 capsule Talbot Grumbling, FNP      PDMP not reviewed this encounter.   Talbot Grumbling, [place] 12/14/21 1110

## 2021-12-16 MED ORDER — LANTUS SOLOSTAR 100 UNIT/ML ~~LOC~~ SOPN: 100.0000 [IU] | PEN_INJECTOR | SUBCUTANEOUS | 3 refills | Status: DC

## 2021-12-16 NOTE — Telephone Encounter (Signed)
RX now sent to [facility]

## 2021-12-16 NOTE — Telephone Encounter (Signed)
[facility] is calling to check on the refill for patient for ulin glargine (LANTUS SOLOSTAR) 100 UNIT/ML Solostar Pen  [phone]

## 2021-12-16 NOTE — Addendum Note (Signed)
Addended by: Sarina Ill on: 12/16/2021 01:53 PM   Modules accepted: Orders

## 2021-12-17 ENCOUNTER — Telehealth: Payer: Self-pay | Admitting: *Deleted

## 2021-12-17 ENCOUNTER — Telehealth: Payer: Self-pay | Admitting: Family Medicine

## 2021-12-17 NOTE — Telephone Encounter (Signed)
Left message for patient to call back and schedule Medicare Annual Wellness Visit (AWV).   Please offer to do virtually or by telephone.  Left office number and my jabber [phone].  Last AWV:04/21/2019  Please schedule at anytime with Nurse Health Advisor.

## 2021-12-17 NOTE — Patient Outreach (Signed)
  Care Coordination   12/17/2021 Name: Daniel [name] MRN: [id number] DOB: 20-Apr-1955   Care Coordination Outreach Attempts:  A second unsuccessful outreach was attempted today to offer the patient with information about available care coordination services as a benefit of their health plan.     Follow Up Plan:  Additional outreach attempts will be made to offer the patient care coordination information and services.   Encounter Outcome:  No Answer  Care Coordination Interventions Activated:  No   Care Coordination Interventions:  No, not indicated    Raina Mina, RN Care Management Coordinator [place] Office [phone]

## 2021-12-19 ENCOUNTER — Encounter: Payer: Self-pay | Admitting: Family Medicine

## 2021-12-19 ENCOUNTER — Ambulatory Visit (INDEPENDENT_AMBULATORY_CARE_PROVIDER_SITE_OTHER): Payer: PPO | Admitting: Family Medicine

## 2021-12-19 VITALS — BP 118/60 | HR 96 | Temp 98.6°F | Ht 70.0 in | Wt 248.2 lb

## 2021-12-19 DIAGNOSIS — I824Y9 Acute embolism and thrombosis of unspecified deep veins of unspecified proximal lower extremity: Secondary | ICD-10-CM | POA: Diagnosis not present

## 2021-12-19 DIAGNOSIS — G8929 Other chronic pain: Secondary | ICD-10-CM | POA: Diagnosis not present

## 2021-12-19 DIAGNOSIS — M25561 Pain in right knee: Secondary | ICD-10-CM

## 2021-12-19 DIAGNOSIS — Z794 Long term (current) use of insulin: Secondary | ICD-10-CM | POA: Diagnosis not present

## 2021-12-19 DIAGNOSIS — R3914 Feeling of incomplete bladder emptying: Secondary | ICD-10-CM | POA: Diagnosis not present

## 2021-12-19 DIAGNOSIS — E1142 Type 2 diabetes mellitus with diabetic polyneuropathy: Secondary | ICD-10-CM | POA: Diagnosis not present

## 2021-12-19 DIAGNOSIS — N1831 Chronic kidney disease, stage 3a: Secondary | ICD-10-CM

## 2021-12-19 DIAGNOSIS — M25562 Pain in left knee: Secondary | ICD-10-CM

## 2021-12-19 DIAGNOSIS — E1122 Type 2 diabetes mellitus with diabetic chronic kidney disease: Secondary | ICD-10-CM | POA: Diagnosis not present

## 2021-12-19 DIAGNOSIS — N183 Chronic kidney disease, stage 3 unspecified: Secondary | ICD-10-CM | POA: Diagnosis not present

## 2021-12-19 DIAGNOSIS — M792 Neuralgia and neuritis, unspecified: Secondary | ICD-10-CM | POA: Diagnosis not present

## 2021-12-19 DIAGNOSIS — Z7901 Long term (current) use of anticoagulants: Secondary | ICD-10-CM

## 2021-12-19 DIAGNOSIS — N401 Enlarged prostate with lower urinary tract symptoms: Secondary | ICD-10-CM | POA: Diagnosis not present

## 2021-12-19 DIAGNOSIS — K219 Gastro-esophageal reflux disease without esophagitis: Secondary | ICD-10-CM

## 2021-12-19 DIAGNOSIS — Z9181 History of falling: Secondary | ICD-10-CM

## 2021-12-19 DIAGNOSIS — R3916 Straining to void: Secondary | ICD-10-CM | POA: Diagnosis not present

## 2021-12-19 MED ORDER — FUROSEMIDE 40 MG PO TABS: ORAL_TABLET | ORAL | 3 refills | Status: DC

## 2021-12-19 MED ORDER — DICLOFENAC SODIUM 1 % EX GEL: CUTANEOUS | 2 refills | Status: DC

## 2021-12-19 MED ORDER — RIVAROXABAN 20 MG PO TABS: ORAL_TABLET | ORAL | 2 refills | Status: DC

## 2021-12-19 MED ORDER — PREGABALIN 300 MG PO CAPS: ORAL_CAPSULE | ORAL | 5 refills | Status: DC

## 2021-12-19 NOTE — Progress Notes (Signed)
Subjective:  Patient ID: Daniel [name], male    DOB: [birth year]  Age: 66 y.o. MRN: [id number]  CC:  Chief Complaint  Patient presents with   Hypothyroidism    Pt states all is well   Diabetes    HPI Daniel [name] [facility] presents for   Peripheral neuropathy Treated with Lyrica 300 mg twice daily. See prior visits.  History of falls, evaluated by PT previously.  Referred to OT.  Use of walker recommended previously, declined.  Has used cane, consistent use of assistive device recommended.  Fall prevention in the home has been discussed with handouts given previously. Lyrica helping control pain. Few falls last month, no injuries. Intermittent use of cane.  Controlled substance database (PDMP) reviewed. No concerns appreciated. Last filled lyrica 9/22  Diabetes treated by Endocrinology, previously [name], now Dr. Cruzita Lederer. Next appointment November 28.  Last A1c 6.6 on 09/19/2021.  On low dose levothyroxine by psychiatrist 48mg, does not have hx of hypothyroidism per last note with endocrinology.   GERD:  Stable with protonix, usually controlled unless trigger foods- rare.   Chronic anticoagulation with history of DVT No new bleeding. Continues on xarelto. No new calf pain/swelling.  Lab Results  Component Value Date   WBC 7.2 09/17/2020   HGB 17.2 (H) 09/17/2020   HCT 48.2 09/17/2020   MCV 95.0 09/17/2020   PLT 146.0 (L) 09/17/2020   Chronic knee pain: Doing great with use of voltaren gel.   CKD, HTN Followed by nephrology - Dr. GMoshe Cipro Stage III CKD.  Avoiding ACE/ARB due to acute on chronic renal failure previously.  Creatinine settled around 1.5-1.7 hypertension has been stable on current meds including furosemide, no other antihypertensives.  618-monthollow-up planned.  Last visit August 23. Lasix 4031md. Swelling stable.   BP Readings from Last 3 Encounters:  12/19/21 118/60  12/13/21 119/61  10/08/21 122/60   Lab Results  Component Value Date    CREATININE 1.73 (H) 10/08/2021     History Patient Active Problem List   Diagnosis Date Noted   Mixed hyperlipidemia 09/19/2021   Type II diabetes mellitus with peripheral circulatory disorder (HCCNew Hope7/27/2023   Insomnia due to mental condition 05/23/2021   Atherosclerosis of native artery of extremity (HCCSmith Valley3/03/2021   Chronic nonalcoholic liver disease 03/66/06/3016History of hepatitis B 04/25/2021   HIV positive (HCCChamblee3/03/2021   Long term (current) use of insulin (HCCTylertown3/03/2021   Long term (current) use of anticoagulants 04/25/07/3235Nonalcoholic steatohepatitis (NASH) 04/25/2021   Type 2 diabetes mellitus with other circulatory complications (HCCCudahy3/57/32/2025Type 2 diabetes mellitus with peripheral angiopathy (HCCGlendive3/03/2021   Abnormal liver function tests 04/25/2021   Drug-induced myopathy 02/12/2021   Unilateral primary osteoarthritis, right knee 12/15/2019   Generalized weakness 09/14/2019   Severe major depression (HCCNorth College Hill9/23/2020   Class 2 obesity due to excess calories without serious comorbidity with body mass index (BMI) of 36.0 to 36.9 in adult 11/17/2018   Anticoagulated 08/24/2018   Dyslipidemia 07/27/2018   Essential hypertension 06/25/2018   Hypogonadism in male 05/10/2018   Numbness 03/31/2018   Diabetic peripheral neuropathy (HCCOnaga2/03/2017   S/P transmetatarsal amputation of foot, right (HCCBacon8/16/2019   Hypothyroidism    Constipation    Obesity, Class III, BMI 40-49.9 (morbid obesity) (HCCChickaloon1/22/2018   DOE (dyspnea on exertion) 01/13/2017   Imbalance 11/18/2016   Osteomyelitis of right foot (HCCWalnut Grove7/30/2018   Chronic migraine 09/17/2016   Gait abnormality 09/17/2016  Fall 12/05/2015   Toe ulcer, right ([place]) 09/19/2015   Decreased pedal pulses 09/19/2015   OSA on CPAP 09/05/2015   Major depressive disorder, recurrent episode, moderate ([place]) 09/05/2015   Recurrent falls while walking 06/24/2015   H/O migraine 06/22/2015   Sinus headache  06/21/2015   OSA (obstructive sleep apnea) 06/20/2015   History of DVT (deep vein thrombosis) 06/20/2015   History of falling 06/20/2015   Chronic, continuous use of opioids 06/20/2015   Coagulopathy ([place]) 06/20/2015   Elevated CPK 06/20/2015   Pain syndrome, chronic 06/20/2015   Thrombocythemia 06/20/2015   Uncontrolled type 2 diabetes mellitus with hyperglycemia ([place]) 06/20/2015   Low back pain 06/11/2015   Unspecified abnormalities of gait and mobility 06/11/2015   Benign paroxysmal positional vertigo of right ear 05/01/2015   Chronic kidney disease, stage III (moderate) ([place]) 08/09/2014   Renal insufficiency 08/09/2014   Insulin-requiring or dependent type II diabetes mellitus ([place]) 11/07/2013   Diabetes mellitus ([place]) 11/07/2013   Steatosis of liver 09/09/2010   Human immunodeficiency virus (HIV) infection ([place]) 06/04/2006   Herpes zoster 06/04/2006   Depression 06/04/2006   THROMBOPHLEBITIS NOS 06/04/2006   Gastroesophageal reflux disease 06/04/2006   ARTHRITIS, HAND 06/04/2006   Attention deficit hyperactivity disorder 06/04/2006   Abnormal blood chemistry level 06/04/2006   Past Medical History:  Diagnosis Date   ADHD (attention deficit hyperactivity disorder)    Anxiety    Chronic kidney disease    Clotting disorder ([place])    Depression    Diabetes mellitus without complication ([place])    Diabetes mellitus, type II ([place])    Dizziness 03/17/2015   Essential hypertension 06/25/2018   GERD (gastroesophageal reflux disease)    HIV disease ([name])    HIV infection ([place])    Liver disease    OSA (obstructive sleep apnea) 07/25/2015   Uses CPAP regularly   Peripheral vascular disease ([place])    Ulcer    Past Surgical History:  Procedure Laterality Date   AMPUTATION Right 10/02/2017   Procedure: RIGHT TRANSMETATARSAL AMPUTATION;  Surgeon: Leandrew Koyanagi, MD;  Location: [facility];  Service: Orthopedics;  Laterality: Right;   SMALL INTESTINE SURGERY     STOMACH SURGERY     TOE  AMPUTATION Right 08/2016   right great toe   Allergies  Allergen Reactions   Aspirin Swelling   Efavirenz Swelling and Rash    Other reaction(s): anaphylaxis   Ibuprofen Swelling   Nsaids Other (See Comments)    unknwn   Pravastatin     myalgias   Lipitor [Atorvastatin Calcium] Other (See Comments)    Leg pain   Prior to Admission medications   Medication Sig Start Date End Date Taking? Authorizing Provider  acetaminophen (TYLENOL) 500 MG tablet Take 1,500 mg by mouth in the morning and at bedtime.   Yes [provider]  ALPRAZolam Duanne Moron) 1 MG tablet Take 1 mg by mouth at bedtime. *May take one tablet up to 4 times daily as needed for anxiety   Yes [provider]  amphetamine-dextroamphetamine (ADDERALL) 30 MG tablet Take 30 mg by mouth 3 (three) times daily.   Yes [provider]  brexpiprazole (REXULTI) 1 MG TABS tablet Take 1 mg by mouth at bedtime.   Yes [provider]  cephALEXin (KEFLEX) 500 MG capsule Take 1 capsule (500 mg total) by mouth 2 (two) times daily for 7 days. 12/13/21 12/20/21 Yes Stanhope, Stasia Cavalier, FNP  Continuous Blood Gluc Receiver (FREESTYLE LIBRE 2 READER) DEVI Use as  instructed to check blood sugars. 04/18/21  Yes Renato Shin, MD  dapagliflozin propanediol (FARXIGA) 10 MG TABS tablet Take 1 tablet (10 mg total) by mouth daily before breakfast. 04/18/21  Yes Renato Shin, MD  Dextromethorphan-buPROPion ER (AUVELITY) 45-105 MG TBCR Take 1 tablet by mouth daily at 12 noon.   Yes [provider]  diclofenac Sodium (VOLTAREN) 1 % GEL APPLY 2 GM TO EACH KNEE EVERY MORNING AND EVERY NIGHT AT BEDTIME, AND APPLY 1 GM TO EACH KNEE EVERY AFTERNOON 08/07/21  Yes [name], MD  divalproex (DEPAKOTE ER) 500 MG 24 hr tablet TAKE 1 TABLET BY MOUTH AT BEDTIME 11/28/21  Yes [name], MD  Dulaglutide (TRULICITY) 4.5 BS/9.6GE SOPN Inject 4.5 mg as directed once a week. 09/17/21  Yes Philemon Kingdom, MD  Evolocumab  (REPATHA SURECLICK) 366 MG/ML SOAJ Inject 140 mg into the skin every 14 (fourteen) days. 11/18/21  Yes Skeet Latch, MD  ezetimibe (ZETIA) 10 MG tablet TAKE 1 TABLET(10 MG) BY MOUTH DAILY 02/20/21  Yes Skeet Latch, MD  furosemide (LASIX) 40 MG tablet TAKE 1 TABLET(40 MG) BY MOUTH DAILY 09/13/21  Yes [name], MD  insulin aspart (NOVOLOG FLEXPEN) 100 UNIT/ML FlexPen 15 units with breakfast and 25 units with supper.  And pen needles 3/day 04/18/21  Yes Renato Shin, MD  insulin glargine (LANTUS SOLOSTAR) 100 UNIT/ML Solostar Pen Inject 100 Units into the skin every morning. 12/16/21  Yes Philemon Kingdom, MD  Insulin Pen Needle (B-D ULTRAFINE III SHORT PEN) 31G X 8 MM MISC USE AS DIRECTED THREE TIMES DAILY 10/23/21  Yes Philemon Kingdom, MD  levothyroxine (SYNTHROID, LEVOTHROID) 50 MCG tablet Take 1 tablet (50 mcg total) by mouth at bedtime. 07/24/15  Yes [name], MD  pantoprazole (PROTONIX) 40 MG tablet TAKE 1 TABLET(40 MG) BY MOUTH DAILY 12/16/21  Yes [name], MD  pregabalin (LYRICA) 300 MG capsule TAKE 1 CAPSULE(300 MG) BY MOUTH TWICE DAILY 11/14/21  Yes [name], MD  protriptyline (VIVACTIL) 10 MG tablet Take 10 mg by mouth 3 (three) times daily.  01/30/16  Yes [provider]  silodosin (RAPAFLO) 8 MG CAPS capsule Take 8 mg by mouth daily.   Yes [provider]  TRIUMEQ 600-50-300 MG tablet TAKE 1 TABLET BY MOUTH DAILY 10/14/21  Yes Comer, Okey Regal, MD  XARELTO 20 MG TABS tablet TAKE 1 TABLET(20 MG) BY MOUTH DAILY WITH SUPPER 09/04/21  Yes [name], MD  zolpidem (AMBIEN) 10 MG tablet Take 10 mg by mouth at bedtime.   Yes [provider]  ondansetron (ZOFRAN) 8 MG tablet TAKE 1 TABLET BY MOUTH EVERY 8 HOURS AS NEEDED FOR NAUSEA OR VOMITING Patient not taking: Reported on 12/19/2021 07/09/21   [name], MD   Social History   Socioeconomic History   Marital status: Single    Spouse name: Not on file   Number of  children: Not on file   Years of education: Not on file   Highest education level: Not on file  Occupational History    Comment: DISABILITY  Tobacco Use   Smoking status: Former    Packs/day: 0.10    Years: 10.00    Total pack years: 1.00    Types: Cigars, Cigarettes    Quit date: 08/09/2014    Years since quitting: 7.3   Smokeless tobacco: Never  Vaping Use   Vaping Use: Never used  Substance and Sexual Activity   Alcohol use: No  Alcohol/week: 0.0 standard drinks of alcohol   Drug use: No   Sexual activity: Not Currently    Partners: Male    Comment: pt. declined condoms  Other Topics Concern   Not on file  Social History Narrative   Epworth Sleepiness Scale = 7 (as of 03/16/2015)   Social Determinants of Health   Financial Resource Strain: Medium Risk (02/20/2021)   Overall Financial Resource Strain (CARDIA)    Difficulty of Paying Living Expenses: Somewhat hard  Food Insecurity: No Food Insecurity (10/01/2017)   Hunger Vital Sign    Worried About Running Out of Food in the Last Year: Never true    Ran Out of Food in the Last Year: Never true  Transportation Needs: No Transportation Needs (10/01/2017)   PRAPARE - Hydrologist (Medical): No    Lack of Transportation (Non-Medical): No  Physical Activity: Inactive (10/01/2017)   Exercise Vital Sign    Days of Exercise per Week: 0 days    Minutes of Exercise per Session: 0 min  Stress: No Stress Concern Present (10/01/2017)   [place]    Feeling of Stress : Only a little  Social Connections: Moderately Isolated (10/01/2017)   Social Connection and Isolation Panel [NHANES]    Frequency of Communication with Friends and Family: More than three times a week    Frequency of Social Gatherings with Friends and Family: Once a week    Attends Religious Services: Never    Marine scientist or Organizations: No    Attends Programme researcher, broadcasting/film/video: Not asked    Marital Status: Never married  Intimate Partner Violence: Not At Risk (10/01/2017)   Humiliation, Afraid, Rape, and Kick questionnaire    Fear of Current or Ex-Partner: No    Emotionally Abused: No    Physically Abused: No    Sexually Abused: No    Review of Systems  Constitutional:  Negative for fatigue and unexpected weight change.  Eyes:  Positive for visual disturbance (no recent changes. ongoing - will see optho next month.).  Respiratory:  Negative for cough, chest tightness and shortness of breath.   Cardiovascular:  Negative for chest pain, palpitations and leg swelling.  Gastrointestinal:  Negative for abdominal pain and blood in stool.  Neurological:  Negative for dizziness, light-headedness and headaches.     Objective:   Vitals:   12/19/21 1533  BP: 118/60  Pulse: 96  Temp: 98.6 F (37 C)  SpO2: 97%  Weight: 248 lb 3.2 oz (112.6 kg)  Height: 5' 10"  (1.778 m)     Physical Exam Vitals reviewed.  Constitutional:      Appearance: He is well-developed.  HENT:     Head: Normocephalic and atraumatic.  Neck:     Vascular: No carotid bruit or JVD.  Cardiovascular:     Rate and Rhythm: Normal rate and regular rhythm.     Heart sounds: Normal heart sounds. No murmur heard. Pulmonary:     Effort: Pulmonary effort is normal.     Breath sounds: Normal breath sounds. No rales.  Musculoskeletal:     Right lower leg: No edema.     Left lower leg: No edema.  Skin:    General: Skin is warm and dry.  Neurological:     Mental Status: He is alert and oriented to person, place, and time.  Psychiatric:        Mood and Affect: Mood normal.  Assessment & Plan:  MARQUELL SAENZ is a 66 y.o. male . Stage 3 chronic kidney disease, unspecified whether stage 3a or 3b CKD ([place]) - Plan: Basic metabolic panel  -Check updated labs, continue to avoid nephrotoxins, follow-up with nephrology.  Chronic pain of both knees - Plan: diclofenac  Sodium (VOLTAREN) 1 % GEL  -Stable control with Voltaren gel, continue same.  Type 2 diabetes mellitus with stage 3a chronic kidney disease, with long-term current use of insulin ([place]) - Plan: Microalbumin / creatinine urine ratio  -Check urine microalbumin, continue follow-up with endocrinology.  Diabetic peripheral neuropathy ([name]) - Plan: pregabalin (LYRICA) 300 MG capsule  -Overall stable with current dose of pregabalin, potential side effects and risks have been discussed.  Decided to remain on current dosing.  Peripheral neuropathic pain - Plan: pregabalin (LYRICA) 300 MG capsule As above  Deep vein thrombosis (DVT) of proximal lower extremity, unspecified chronicity, unspecified laterality ([name]) - Plan: rivaroxaban (XARELTO) 20 MG TABS tablet Chronic anticoagulation - Plan: CBC  -Denies new bleeding or side effects with Xarelto, continue same.  Denies new symptoms of DVT.  History of fall  -See prior visits, we have discussed fall precautions previously including use of cane, walker.  Reinforced again today.  Also recommended continued review of meds with psychiatry to make sure those are not contributing.    Chronic GERD  -Stable with use of Protonix, continue same and avoid trigger foods.   Meds ordered this encounter  Medications   diclofenac Sodium (VOLTAREN) 1 % GEL    Sig: APPLY 2 GM TO EACH KNEE EVERY MORNING AND EVERY NIGHT AT BEDTIME, AND APPLY 1 GM TO EACH KNEE EVERY AFTERNOON    Dispense:  300 g    Refill:  2   furosemide (LASIX) 40 MG tablet    Sig: TAKE 1 TABLET(40 MG) BY MOUTH DAILY    Dispense:  90 tablet    Refill:  3   pregabalin (LYRICA) 300 MG capsule    Sig: TAKE 1 CAPSULE(300 MG) BY MOUTH TWICE DAILY    Dispense:  60 capsule    Refill:  5   rivaroxaban (XARELTO) 20 MG TABS tablet    Sig: TAKE 1 TABLET(20 MG) BY MOUTH DAILY WITH SUPPER    Dispense:  90 tablet    Refill:  2   Patient Instructions  I do recommend using cane at all times and walker  may be more effective. Let me know if I can order a walker. Make sure your psychiatrist knows about the falls to discuss if any med adjustments are needed.       Fall Prevention in the Home, Adult Falls can cause injuries and affect people of all ages. There are many simple things that you can do to make your home safe and to help prevent falls. Ask for help when making these changes, if needed. What actions can I take to prevent falls? General instructions Use good lighting in all rooms. Replace any light bulbs that burn out, turn on lights if it is dark, and use night-lights. Place frequently used items in easy-to-reach places. Lower the shelves around your home if necessary. Set up furniture so that there are clear paths around it. Avoid moving your furniture around. Remove throw rugs and other tripping hazards from the floor. Avoid walking on wet floors. Fix any uneven floor surfaces. Add color or contrast paint or tape to grab bars and handrails in your home. Place contrasting color strips on the first and  last steps of staircases. When you use a stepladder, make sure that it is completely opened and that the sides and supports are firmly locked. Have someone hold the ladder while you are using it. Do not climb a closed stepladder. Know where your pets are when moving through your home. What can I do in the bathroom?     Keep the floor dry. Immediately clean up any water that is on the floor. Remove soap buildup in the tub or shower regularly. Use nonskid mats or decals on the floor of the tub or shower. Attach bath mats securely with double-sided, nonslip rug tape. If you need to sit down while you are in the shower, use a plastic, nonslip stool. Install grab bars by the toilet and in the tub and shower. Do not use towel bars as grab bars. What can I do in the bedroom? Make sure that a bedside light is easy to reach. Do not use oversized bedding that reaches the floor. Have a  firm chair that has side arms to use for getting dressed. What can I do in the kitchen? Clean up any spills right away. If you need to reach for something above you, use a sturdy step stool that has a grab bar. Keep electrical cables out of the way. Do not use floor polish or wax that makes floors slippery. If you must use wax, make sure that it is non-skid floor wax. What can I do with my stairs? Do not leave any items on the stairs. Make sure that you have a light switch at the top and the bottom of the stairs. Have them installed if you do not have them. Make sure that there are handrails on both sides of the stairs. Fix handrails that are broken or loose. Make sure that handrails are as long as the staircases. Install non-slip stair treads on all stairs in your home. Avoid having throw rugs at the top or bottom of stairs, or secure the rugs with carpet tape to prevent them from moving. Choose a carpet design that does not hide the edge of steps on the stairs. Check any carpeting to make sure that it is firmly attached to the stairs. Fix any carpet that is loose or worn. What can I do on the outside of my home? Use bright outdoor lighting. Regularly repair the edges of walkways and driveways and fix any cracks. Remove high doorway thresholds. Trim any shrubbery on the main path into your home. Regularly check that handrails are securely fastened and in good repair. Both sides of all steps should have handrails. Install guardrails along the edges of any raised decks or porches. Clear walkways of debris and clutter, including tools and rocks. Have leaves, snow, and ice cleared regularly. Use sand or salt on walkways during winter months. In the garage, clean up any spills right away, including grease or oil spills. What other actions can I take? Wear closed-toe shoes that fit well and support your feet. Wear shoes that have rubber soles or low heels. Use mobility aids as needed, such as  canes, walkers, scooters, and crutches. Review your medicines with your health care provider. Some medicines can cause dizziness or changes in blood pressure, which increase your risk of falling. Talk with your health care provider about other ways that you can decrease your risk of falls. This may include working with a physical therapist or trainer to improve your strength, balance, and endurance. Where to find more information Centers for  Disease Control and Prevention, [employer]: http://www.wolf.info/ National Institute on Aging: http://kim-miller.com/ Contact a health care provider if: You are afraid of falling at home. You feel weak, drowsy, or dizzy at home. You fall at home. Summary There are many simple things that you can do to make your home safe and to help prevent falls. Ways to make your home safe include removing tripping hazards and installing grab bars in the bathroom. Ask for help when making these changes in your home. This information is not intended to replace advice given to you by your health care provider. Make sure you discuss any questions you have with your health care provider. Document Revised: 11/12/2020 Document Reviewed: 09/14/2019 Elsevier Patient Education  [place],   [name], MD [place], [facility] 12/19/21 4:24 PM

## 2021-12-19 NOTE — Patient Instructions (Addendum)
I do recommend using cane at all times and walker may be more effective. Let me know if I can order a walker. Make sure your psychiatrist knows about the falls to discuss if any med adjustments are needed.       Fall Prevention in the Home, Adult Falls can cause injuries and affect people of all ages. There are many simple things that you can do to make your home safe and to help prevent falls. Ask for help when making these changes, if needed. What actions can I take to prevent falls? General instructions Use good lighting in all rooms. Replace any light bulbs that burn out, turn on lights if it is dark, and use night-lights. Place frequently used items in easy-to-reach places. Lower the shelves around your home if necessary. Set up furniture so that there are clear paths around it. Avoid moving your furniture around. Remove throw rugs and other tripping hazards from the floor. Avoid walking on wet floors. Fix any uneven floor surfaces. Add color or contrast paint or tape to grab bars and handrails in your home. Place contrasting color strips on the first and last steps of staircases. When you use a stepladder, make sure that it is completely opened and that the sides and supports are firmly locked. Have someone hold the ladder while you are using it. Do not climb a closed stepladder. Know where your pets are when moving through your home. What can I do in the bathroom?     Keep the floor dry. Immediately clean up any water that is on the floor. Remove soap buildup in the tub or shower regularly. Use nonskid mats or decals on the floor of the tub or shower. Attach bath mats securely with double-sided, nonslip rug tape. If you need to sit down while you are in the shower, use a plastic, nonslip stool. Install grab bars by the toilet and in the tub and shower. Do not use towel bars as grab bars. What can I do in the bedroom? Make sure that a bedside light is easy to reach. Do not use  oversized bedding that reaches the floor. Have a firm chair that has side arms to use for getting dressed. What can I do in the kitchen? Clean up any spills right away. If you need to reach for something above you, use a sturdy step stool that has a grab bar. Keep electrical cables out of the way. Do not use floor polish or wax that makes floors slippery. If you must use wax, make sure that it is non-skid floor wax. What can I do with my stairs? Do not leave any items on the stairs. Make sure that you have a light switch at the top and the bottom of the stairs. Have them installed if you do not have them. Make sure that there are handrails on both sides of the stairs. Fix handrails that are broken or loose. Make sure that handrails are as long as the staircases. Install non-slip stair treads on all stairs in your home. Avoid having throw rugs at the top or bottom of stairs, or secure the rugs with carpet tape to prevent them from moving. Choose a carpet design that does not hide the edge of steps on the stairs. Check any carpeting to make sure that it is firmly attached to the stairs. Fix any carpet that is loose or worn. What can I do on the outside of my home? Use bright outdoor lighting. Regularly repair the  edges of walkways and driveways and fix any cracks. Remove high doorway thresholds. Trim any shrubbery on the main path into your home. Regularly check that handrails are securely fastened and in good repair. Both sides of all steps should have handrails. Install guardrails along the edges of any raised decks or porches. Clear walkways of debris and clutter, including tools and rocks. Have leaves, snow, and ice cleared regularly. Use sand or salt on walkways during winter months. In the garage, clean up any spills right away, including grease or oil spills. What other actions can I take? Wear closed-toe shoes that fit well and support your feet. Wear shoes that have rubber soles or  low heels. Use mobility aids as needed, such as canes, walkers, scooters, and crutches. Review your medicines with your health care provider. Some medicines can cause dizziness or changes in blood pressure, which increase your risk of falling. Talk with your health care provider about other ways that you can decrease your risk of falls. This may include working with a physical therapist or trainer to improve your strength, balance, and endurance. Where to find more information Centers for Disease Control and Prevention, [employer]: http://www.wolf.info/ National Institute on Aging: http://kim-miller.com/ Contact a health care provider if: You are afraid of falling at home. You feel weak, drowsy, or dizzy at home. You fall at home. Summary There are many simple things that you can do to make your home safe and to help prevent falls. Ways to make your home safe include removing tripping hazards and installing grab bars in the bathroom. Ask for help when making these changes in your home. This information is not intended to replace advice given to you by your health care provider. Make sure you discuss any questions you have with your health care provider. Document Revised: 11/12/2020 Document Reviewed: 09/14/2019 Elsevier Patient Education  [place].

## 2021-12-20 LAB — BASIC METABOLIC PANEL
BUN: 32 mg/dL — ABNORMAL HIGH (ref 6–23)
CO2: 27 mEq/L (ref 19–32)
Calcium: 9.5 mg/dL (ref 8.4–10.5)
Chloride: 101 mEq/L (ref 96–112)
Creatinine, Ser: 1.86 mg/dL — ABNORMAL HIGH (ref 0.40–1.50)
GFR: 36.78 mL/min — ABNORMAL LOW (ref >60.00)
Glucose, Bld: 323 mg/dL — ABNORMAL HIGH (ref 70–99)
Potassium: 4.7 mEq/L (ref 3.5–5.1)
Sodium: 137 mEq/L (ref 135–145)

## 2021-12-20 LAB — CBC
HCT: 47.5 % (ref 39.0–52.0)
Hemoglobin: 16.4 g/dL (ref 13.0–17.0)
MCHC: 34.5 g/dL (ref 30.0–36.0)
MCV: 94.4 fl (ref 78.0–100.0)
Platelets: 174 10*3/uL (ref 150.0–400.0)
RBC: 5.04 Mil/uL (ref 4.22–5.81)
RDW: 14.2 % (ref 11.5–15.5)
WBC: 8.5 10*3/uL (ref 4.0–10.5)

## 2021-12-20 LAB — MICROALBUMIN / CREATININE URINE RATIO
Creatinine,U: 22.1 mg/dL
Microalb Creat Ratio: 14.1 mg/g (ref 0.0–30.0)
Microalb, Ur: 3.1 mg/dL — ABNORMAL HIGH (ref 0.0–1.9)

## 2021-12-24 ENCOUNTER — Telehealth: Payer: Self-pay

## 2021-12-24 NOTE — Telephone Encounter (Signed)
Pharmacy lvm to reconcile medication. Attempted to call back.

## 2021-12-25 ENCOUNTER — Encounter: Payer: Self-pay | Admitting: Family Medicine

## 2021-12-25 ENCOUNTER — Other Ambulatory Visit: Payer: Self-pay

## 2021-12-25 DIAGNOSIS — N1831 Chronic kidney disease, stage 3a: Secondary | ICD-10-CM

## 2021-12-25 MED ORDER — NOVOLOG FLEXPEN 100 UNIT/ML ~~LOC~~ SOPN: PEN_INJECTOR | SUBCUTANEOUS | 3 refills | Status: DC

## 2021-12-25 MED ORDER — TRULICITY 4.5 MG/0.5ML ~~LOC~~ SOAJ: 4.5000 mg | SUBCUTANEOUS | 3 refills | Status: DC

## 2021-12-26 DIAGNOSIS — R3914 Feeling of incomplete bladder emptying: Secondary | ICD-10-CM | POA: Diagnosis not present

## 2021-12-26 DIAGNOSIS — N401 Enlarged prostate with lower urinary tract symptoms: Secondary | ICD-10-CM | POA: Diagnosis not present

## 2022-01-02 DIAGNOSIS — R351 Nocturia: Secondary | ICD-10-CM | POA: Diagnosis not present

## 2022-01-02 DIAGNOSIS — R3916 Straining to void: Secondary | ICD-10-CM | POA: Diagnosis not present

## 2022-01-02 DIAGNOSIS — N401 Enlarged prostate with lower urinary tract symptoms: Secondary | ICD-10-CM | POA: Diagnosis not present

## 2022-01-02 DIAGNOSIS — R3914 Feeling of incomplete bladder emptying: Secondary | ICD-10-CM | POA: Diagnosis not present

## 2022-01-02 DIAGNOSIS — R350 Frequency of micturition: Secondary | ICD-10-CM | POA: Diagnosis not present

## 2022-01-07 DIAGNOSIS — G4733 Obstructive sleep apnea (adult) (pediatric): Secondary | ICD-10-CM | POA: Diagnosis not present

## 2022-01-14 ENCOUNTER — Telehealth: Payer: Self-pay | Admitting: Family Medicine

## 2022-01-14 DIAGNOSIS — R350 Frequency of micturition: Secondary | ICD-10-CM | POA: Diagnosis not present

## 2022-01-14 DIAGNOSIS — R351 Nocturia: Secondary | ICD-10-CM | POA: Diagnosis not present

## 2022-01-14 DIAGNOSIS — N401 Enlarged prostate with lower urinary tract symptoms: Secondary | ICD-10-CM | POA: Diagnosis not present

## 2022-01-14 NOTE — Telephone Encounter (Signed)
Left message for patient to call back and schedule Medicare Annual Wellness Visit (AWV) in office.   If not able to come in office, please offer to do virtually or by telephone.  Left office number and my jabber [phone].  Last AWV:04/21/2019   Please schedule at anytime with Nurse Health Advisor.

## 2022-01-21 ENCOUNTER — Encounter: Payer: Self-pay | Admitting: Internal Medicine

## 2022-01-21 ENCOUNTER — Ambulatory Visit: Payer: PPO | Admitting: Internal Medicine

## 2022-01-21 DIAGNOSIS — E1122 Type 2 diabetes mellitus with diabetic chronic kidney disease: Secondary | ICD-10-CM

## 2022-01-21 DIAGNOSIS — N1831 Chronic kidney disease, stage 3a: Secondary | ICD-10-CM | POA: Diagnosis not present

## 2022-01-21 DIAGNOSIS — Z794 Long term (current) use of insulin: Secondary | ICD-10-CM

## 2022-01-21 LAB — POCT GLYCOSYLATED HEMOGLOBIN (HGB A1C): Hemoglobin A1C: 6.4 % — AB (ref 4.0–5.6)

## 2022-01-21 MED ORDER — LANTUS SOLOSTAR 100 UNIT/ML ~~LOC~~ SOPN: 80.0000 [IU] | PEN_INJECTOR | SUBCUTANEOUS | 3 refills | Status: DC

## 2022-01-21 NOTE — Patient Instructions (Signed)
Please continue: - Farxiga 10 mg before b'fast - Trulicity 4.5 mg weekly  Decrease: - Lantus 80 units  in a.m.  Change: - Novolog 15 minutes before meals: 15-20 units in a.m. and 25-30 units  Try to stop milk!  Please return in 3-4 months.

## 2022-01-21 NOTE — Progress Notes (Signed)
Patient ID: TAYM TWIST, male   DOB: 09-Dec-1955, 66 y.o.   MRN: [id number]  HPI: Daniel [name] is a 66 y.o.-year-old male, returning for follow-up for DM2, dx in 2015, insulin-dependent since 2016, uncontrolled, with complications (PAD, foot ulcer, status post right transmetatarsal amputation, stage IIIa CKD, PN). Pt. previously saw [name], but last visit with me 68 months ago.  Interim history: No increased urination, blurry vision, chest pain.  He continues to have some nausea. He gained 5 lbs since last visit -he wonders if this is due to retention. He has dry and watery eyes -he feels that this is related to the CPAP machine.  Reviewed HbA1c: Lab Results  Component Value Date   HGBA1C 6.6 (A) 09/19/2021   HGBA1C 7.0 (A) 04/11/2021   HGBA1C 7.2 (A) 01/09/2021   HGBA1C 7.5 (A) 10/08/2020   HGBA1C 6.3 (A) 05/07/2020   HGBA1C 6.2 (A) 08/24/2019   HGBA1C 7.2 (A) 05/11/2019   HGBA1C 7.5 (A) 02/01/2019   HGBA1C 7.7 (A) 11/08/2018   HGBA1C 7.8 (A) 09/07/2018   Pt is on a regimen of: - Farxiga 10 mg before b'fast - Trulicity 4.5 mg weekly - Lantus 100 units  in a.m.  - Novolog 15 units in a.m. and 25 units before dinner but was taking it even if skips a meal - late dinner (11 pm-4 am) >>  15 minutes before meals: 15 units in a.m. and 25 units (recommendation at last visit)  He is on a CGM - he again did not bring the receiver: - am: 100-200 >> 54-150 - 2h after b'fast: n/c - before lunch: n/c - 2h after lunch: n/c - before dinner: 100-250 >> 100-180, 200, 300 - 2h after dinner: n/c - bedtime: n/c - nighttime: n/c Lowest sugar was 50s (took insulin and did not eat) >> 54; he has hypoglycemia awareness <60.  Highest sugar was 400s >> 300.  Glucometer: ?  He continues to drink 4-5 gallons of 2% milk a week.  He was previously drinking whole milk. He sees nutritionist.  - + CKD, last BUN/creatinine:  Lab Results  Component Value Date   BUN 32 (H) 12/19/2021   BUN 21  10/08/2021   CREATININE 1.86 (H) 12/19/2021   CREATININE 1.73 (H) 10/08/2021  He is not on an ACE inhibitor/ARB.  -+ HL; last set of lipids: Lab Results  Component Value Date   CHOL 100 10/08/2021   HDL 49 10/08/2021   LDLCALC 33 10/08/2021   TRIG 95 10/08/2021   CHOLHDL 2.0 10/08/2021  He is on Zetia 10 mg daily, Repatha.  He is intolerant to statins due to myalgias.   He does have a history of NASH.  - last eye exam was in 02/14/2021. No DR.   - + numbness and tingling in his feet.  Last foot exam was 12/19/2021.  He is on Lyrica 300 mg twice a day.  He does not have a hypothyroidism - this is Rx'ed by his psychiatrist - Dr. Toy Care. Pt is on levothyroxine 50 mcg daily, taken: - in am - fasting - 15-20 min from b'fast  Reviewed his latest TSH levels: Lab Results  Component Value Date   TSH 0.47 05/07/2020   TSH 0.108 (L) 09/14/2019   TSH 0.736 09/17/2016   TSH 0.19 (L) 07/11/2015   TSH 1.067 06/21/2013   TSH 2.762 01/01/2007   He also has a history of HTN, liver disease, OSA, HIV, ADHD, on chronic opioids.  ROS: + see HPI  Past Medical History:  Diagnosis Date   ADHD (attention deficit hyperactivity disorder)    Anxiety    Chronic kidney disease    Clotting disorder ([place])    Depression    Diabetes mellitus without complication ([place])    Diabetes mellitus, type II ([place])    Dizziness 03/17/2015   Essential hypertension 06/25/2018   GERD (gastroesophageal reflux disease)    HIV disease ([name])    HIV infection ([name])    Liver disease    OSA (obstructive sleep apnea) 07/25/2015   Uses CPAP regularly   Peripheral vascular disease ([place])    Ulcer    Past Surgical History:  Procedure Laterality Date   AMPUTATION Right 10/02/2017   Procedure: RIGHT TRANSMETATARSAL AMPUTATION;  Surgeon: Leandrew Koyanagi, MD;  Location: [facility];  Service: Orthopedics;  Laterality: Right;   SMALL INTESTINE SURGERY     STOMACH SURGERY     TOE AMPUTATION Right 08/2016   right great toe    Social History   Socioeconomic History   Marital status: Single    Spouse name: Not on file   Number of children: Not on file   Years of education: Not on file   Highest education level: Not on file  Occupational History    Comment: DISABILITY  Tobacco Use   Smoking status: Former    Packs/day: 0.10    Years: 10.00    Total pack years: 1.00    Types: Cigars, Cigarettes    Quit date: 08/09/2014    Years since quitting: 7.4   Smokeless tobacco: Never  Vaping Use   Vaping Use: Never used  Substance and Sexual Activity   Alcohol use: No    Alcohol/week: 0.0 standard drinks of alcohol   Drug use: No   Sexual activity: Not Currently    Partners: Male    Comment: pt. declined condoms  Other Topics Concern   Not on file  Social History Narrative   Epworth Sleepiness Scale = 7 (as of 03/16/2015)   Social Determinants of Health   Financial Resource Strain: Medium Risk (02/20/2021)   Overall Financial Resource Strain (CARDIA)    Difficulty of Paying Living Expenses: Somewhat hard  Food Insecurity: No Food Insecurity (10/01/2017)   Hunger Vital Sign    Worried About Running Out of Food in the Last Year: Never true    Ran Out of Food in the Last Year: Never true  Transportation Needs: No Transportation Needs (10/01/2017)   PRAPARE - Hydrologist (Medical): No    Lack of Transportation (Non-Medical): No  Physical Activity: Inactive (10/01/2017)   Exercise Vital Sign    Days of Exercise per Week: 0 days    Minutes of Exercise per Session: 0 min  Stress: No Stress Concern Present (10/01/2017)   [place]    Feeling of Stress : Only a little  Social Connections: Moderately Isolated (10/01/2017)   Social Connection and Isolation Panel [NHANES]    Frequency of Communication with Friends and Family: More than three times a week    Frequency of Social Gatherings with Friends and Family: Once a  week    Attends Religious Services: Never    Marine scientist or Organizations: No    Attends Archivist Meetings: Not asked    Marital Status: Never married  Intimate Partner Violence: Not At Risk (10/01/2017)   Humiliation, Afraid, Rape, and Kick questionnaire    Fear of  Current or Ex-Partner: No    Emotionally Abused: No    Physically Abused: No    Sexually Abused: No   Current Outpatient Medications on File Prior to Visit  Medication Sig Dispense Refill   acetaminophen (TYLENOL) 500 MG tablet Take 1,500 mg by mouth in the morning and at bedtime.     ALPRAZolam (XANAX) 1 MG tablet Take 1 mg by mouth at bedtime. *May take one tablet up to 4 times daily as needed for anxiety     amphetamine-dextroamphetamine (ADDERALL) 30 MG tablet Take 30 mg by mouth 3 (three) times daily.     brexpiprazole (REXULTI) 1 MG TABS tablet Take 1 mg by mouth at bedtime.     Continuous Blood Gluc Receiver (FREESTYLE LIBRE 2 READER) DEVI Use as instructed to check blood sugars. 1 each 2   dapagliflozin propanediol (FARXIGA) 10 MG TABS tablet Take 1 tablet (10 mg total) by mouth daily before breakfast. 90 tablet 3   Dextromethorphan-buPROPion ER (AUVELITY) 45-105 MG TBCR Take 1 tablet by mouth daily at 12 noon.     diclofenac Sodium (VOLTAREN) 1 % GEL APPLY 2 GM TO EACH KNEE EVERY MORNING AND EVERY NIGHT AT BEDTIME, AND APPLY 1 GM TO EACH KNEE EVERY AFTERNOON 300 g 2   divalproex (DEPAKOTE ER) 500 MG 24 hr tablet TAKE 1 TABLET BY MOUTH AT BEDTIME 90 tablet 0   Dulaglutide (TRULICITY) 4.5 DE/0.8XK SOPN Inject 4.5 mg as directed once a week. 6 mL 3   Evolocumab (REPATHA SURECLICK) 481 MG/ML SOAJ Inject 140 mg into the skin every 14 (fourteen) days. 2 mL 11   ezetimibe (ZETIA) 10 MG tablet TAKE 1 TABLET(10 MG) BY MOUTH DAILY 90 tablet 3   furosemide (LASIX) 40 MG tablet TAKE 1 TABLET(40 MG) BY MOUTH DAILY 90 tablet 3   insulin aspart (NOVOLOG FLEXPEN) 100 UNIT/ML FlexPen 15 units with breakfast and 25  units with supper. 45 mL 3   insulin glargine (LANTUS SOLOSTAR) 100 UNIT/ML Solostar Pen Inject 100 Units into the skin every morning. 105 mL 3   Insulin Pen Needle (B-D ULTRAFINE III SHORT PEN) 31G X 8 MM MISC USE AS DIRECTED THREE TIMES DAILY 100 each 2   levothyroxine (SYNTHROID, LEVOTHROID) 50 MCG tablet Take 1 tablet (50 mcg total) by mouth at bedtime. 30 tablet 11   ondansetron (ZOFRAN) 8 MG tablet TAKE 1 TABLET BY MOUTH EVERY 8 HOURS AS NEEDED FOR NAUSEA OR VOMITING (Patient not taking: Reported on 12/19/2021) 20 tablet 1   pantoprazole (PROTONIX) 40 MG tablet TAKE 1 TABLET(40 MG) BY MOUTH DAILY 90 tablet 1   pregabalin (LYRICA) 300 MG capsule TAKE 1 CAPSULE(300 MG) BY MOUTH TWICE DAILY 60 capsule 5   protriptyline (VIVACTIL) 10 MG tablet Take 10 mg by mouth 3 (three) times daily.   11   rivaroxaban (XARELTO) 20 MG TABS tablet TAKE 1 TABLET(20 MG) BY MOUTH DAILY WITH SUPPER 90 tablet 2   silodosin (RAPAFLO) 8 MG CAPS capsule Take 8 mg by mouth daily.     TRIUMEQ 600-50-300 MG tablet TAKE 1 TABLET BY MOUTH DAILY 30 tablet 6   zolpidem (AMBIEN) 10 MG tablet Take 10 mg by mouth at bedtime.     No current facility-administered medications on file prior to visit.   Allergies  Allergen Reactions   Aspirin Swelling   Efavirenz Swelling and Rash    Other reaction(s): anaphylaxis   Ibuprofen Swelling   Nsaids Other (See Comments)    unknwn   Pravastatin  myalgias   Lipitor [Atorvastatin Calcium] Other (See Comments)    Leg pain   Family History  Problem Relation Age of Onset   Depression Brother    Throat cancer Brother        half brother, never smoker   COPD Mother    Diabetes Neg Hx    PE: BP 130/78 (BP Location: Right Arm, Patient Position: Sitting, Cuff Size: Normal)   Pulse 66   Ht 5' 10"  (1.778 m)   Wt 253 lb 9.6 oz (115 kg)   SpO2 97%   BMI 36.39 kg/m  Wt Readings from Last 3 Encounters:  01/21/22 253 lb 9.6 oz (115 kg)  12/19/21 248 lb 3.2 oz (112.6 kg)   10/08/21 244 lb (110.7 kg)   Constitutional: overweight, in NAD Eyes: no exophthalmos ENT:no thyromegaly, no cervical lymphadenopathy Cardiovascular: RRR, No MRG, + mild LE edema B (R>L) Respiratory: CTA B Musculoskeletal: no deformities except right transmetatarsal amputation Skin: no rashes Neurological: no tremor with outstretched hands  ASSESSMENT: 1. DM2, insulin-dependent, uncontrolled, with complications - PAD - foot ulcer, status post right transmetatarsal amputation - stage IIIa CKD - PN  2.  Hyperlipidemia  3.  Acquired hypothyroidism  PLAN:  1. Patient with longstanding, fairly well controlled, type 2 diabetes on oral antidiabetic regimen with SGLT2 inhibitor, also basal-bolus insulin regimen and weekly GLP-1 receptor agonist, with improving control.  HbA1c at last visit was 6.6%, lower. -At last visit, we discussed about reducing his milk intake, of which he was drinking 4 to 5 gallons a week.  At this visit, he continues to drink the milk.  We also moved his NovoLog 15 minutes before meals and advised him not to take these unless he is eating. -He has a CGM but he again forgot the receiver at home.  We discussed about starting to check with his sphone. -At today's visit, sugars appear to be at or slightly above target in the morning but he does have occasional blood sugars in the 50s approximately twice a week.  In the evening, sugars are higher after he eats dinner, up to 300s especially now during the holidays.  Therefore, for now, I advised him to reduce the dose of his Lantus insulin and increase slightly the dose of his NovoLog especially with larger meals. - I suggested to:  Patient Instructions  Please continue: - Farxiga 10 mg before b'fast - Trulicity 4.5 mg weekly  Decrease: - Lantus 80 units  in a.m.  Change: - Novolog 15 minutes before meals: 15-20 units in a.m. and 25-30 units  Try to stop milk!  Please return in 3-4 months.  - we checked his  HbA1c: 6.4% (lower) - advised to check sugars at different times of the day - 4x a day, rotating check times - advised for yearly eye exams >> he is UTD - return to clinic in 3-4 months  2. HL - Reviewed latest lipid panel from 02/2021: All fractions abnormal: Lab Results  Component Value Date   CHOL 100 10/08/2021   HDL 49 10/08/2021   LDLCALC 33 10/08/2021   TRIG 95 10/08/2021   CHOLHDL 2.0 10/08/2021  -He continues on Zetia and Repatha without side effects.  He could not tolerate statins due to myalgias.  3.  Thyroid hormone use -He was started on thyroid hormones by his psychiatrist, without a diagnosis of hypothyroidism, apparently for symptom control. -Latest TSH was reviewed with the patient and this was normal: Lab Results  Component Value Date  TSH 0.47 05/07/2020  - he continues on levothyroxine 50 mcg daily -Further management per psychiatry.  Philemon Kingdom, MD PhD [facility]

## 2022-01-29 NOTE — Progress Notes (Deleted)
Chronic Care Management Pharmacy Note  02/05/2022 Name:  Daniel [name] MRN:  [id number] DOB:  1955-08-04  Summary: Pharmd follow up.  Patient started Repatha and tolerating it well.  A1c has improved to 7.0. He is requesting Lyrica to be called in as insurance will pay for 338m #60 instead of previously doubling up on the 1572m Did confirm it is available in this dosage.   Recommendations: Continue with lipid clinic, visit in July for lipids  FU 6 months PharmD 3 months CMA check in  Subjective: Daniel [name] an 89.o. year old male who is a primary patient of GrCarlota RaspberryJeRanell PatrickMD.  The CCM team was consulted for assistance with disease management and care coordination needs.    Engaged with patient by telephone for follow up visit in response to provider referral for pharmacy case management and/or care coordination services.   Consent to Services:  The patient was given the following information about Chronic Care Management services today, agreed to services, and gave verbal consent: 1. CCM service includes personalized support from designated clinical staff supervised by the primary care provider, including individualized plan of care and coordination with other care providers 2. 24/7 contact phone numbers for assistance for urgent and routine care needs. 3. Service will only be billed when office clinical staff spend 20 minutes or more in a month to coordinate care. 4. Only one practitioner may furnish and bill the service in a calendar month. 5.The patient may stop CCM services at any time (effective at the end of the month) by phone call to the office staff. 6. The patient will be responsible for cost sharing (co-pay) of up to 20% of the service fee (after annual deductible is met). Patient agreed to services and consent obtained.  Patient Care Team: GrWendie AgresteMD as PCP - General (Family Medicine) Comer, RoOkey RegalMD as PCP - Infectious Diseases (Infectious  Diseases) RaSkeet LatchMD as PCP - Cardiology (Cardiology) EdLaurence SpatesMD (Inactive) as PCP - Gastroenterology (Gastroenterology) StLandis MartinsDPM as Consulting Physician (Podiatry) ElRenato ShinMD (Inactive) as Consulting Physician (Endocrinology) SuBettina GaviaOD (Optometry) BeLorretta HarpMD as Consulting Physician (Cardiology) KaChucky MayMD as Consulting Physician (Psychiatry) GoCorliss ParishMD as Consulting Physician (Nephrology) MuRenette ButtersMD as Attending Physician (Orthopedic Surgery) YaMarcial PacasMD as Consulting Physician (Neurology) Dohmeier, CaAsencion PartridgeMD as Consulting Physician (Neurology) ShRutherford GuysMD as Consulting Physician (Ophthalmology) DaEdythe ClarityRPTaylor Regional HospitalPharmacist)  Recent office visits:  04/10/21 JeMerri RayMD - Family Medicine - Chest Wall pain - Fall prevention discussed. Follow up as scheduled.    Recent consult visits:  04/25/21 - Evelene CroonPharmD - Started on ReAuburnPAUtahompleted.  Was supposed to come back for fasting labs after the 4th dose.  Does not look like those had been completed yet.   04/11/21 SeRenato ShinMD - Internal Medicine - Diabetes - Labs were ordered. Referral placed for diabetic education. No medication changes. Follow up in 2 months.    04/09/21 RoVance GatherMD - Infectious Disease - HIV - Labs were ordered. No medication changes. Follow up in 1 year.    Hospital visits:  None in previous 6 months  Objective:  Lab Results  Component Value Date   CREATININE 1.86 (H) 12/19/2021   BUN 32 (H) 12/19/2021   GFR 36.78 (L) 12/19/2021   GFRNONAA 46 (L) 01/04/2020   GFRAA 53 (L) 01/04/2020   NA 137 12/19/2021   K  4.7 12/19/2021   CALCIUM 9.5 12/19/2021   CO2 27 12/19/2021   GLUCOSE 323 (H) 12/19/2021    Lab Results  Component Value Date/Time   HGBA1C 6.4 (A) 01/21/2022 03:43 PM   HGBA1C 6.6 (A) 09/19/2021 10:43 AM   HGBA1C 8.7 (H) 06/28/2016 12:00 AM   HGBA1C 7.2 (H) 06/28/2015  08:48 AM   FRUCTOSAMINE 252 05/07/2020 04:37 PM   GFR 36.78 (L) 12/19/2021 04:24 PM   GFR 46.66 (L) 05/07/2020 04:37 PM   MICROALBUR 3.1 (H) 12/19/2021 04:24 PM   MICROALBUR 19.5 (H) 12/05/2020 03:58 PM    Last diabetic Eye exam:  Lab Results  Component Value Date/Time   HMDIABEYEEXA No Retinopathy 02/14/2021 05:16 PM    Last diabetic Foot exam: No results found for: "HMDIABFOOTEX"   Lab Results  Component Value Date   CHOL 100 10/08/2021   HDL 49 10/08/2021   LDLCALC 33 10/08/2021   TRIG 95 10/08/2021   CHOLHDL 2.0 10/08/2021       Latest Ref Rng & Units 10/08/2021    2:43 PM 03/26/2021   10:50 AM 09/15/2019    4:15 AM  Hepatic Function  Total Protein 6.0 - 8.5 g/dL 7.6  7.0  6.7   Albumin 3.9 - 4.9 g/dL 4.7  4.6  3.5   AST 0 - 40 IU/L _0 ALT 0 - 44 IU/L 27  43  24   Alk Phosphatase 44 - 121 IU/L 124  121  78   Total Bilirubin 0.0 - 1.2 mg/dL 0.8  0.6  0.7     Lab Results  Component Value Date/Time   TSH 0.47 05/07/2020 04:37 PM   TSH 0.108 (L) 09/14/2019 03:54 PM   TSH 0.736 09/17/2016 09:57 AM   FREET4 0.78 05/07/2020 04:37 PM   FREET4 1.04 09/15/2019 04:15 AM       Latest Ref Rng & Units 12/19/2021    4:24 PM 09/17/2020   11:40 AM 09/15/2019    4:15 AM  CBC  WBC 4.0 - 10.5 K/uL 8.5  7.2  6.0   Hemoglobin 13.0 - 17.0 g/dL 16.4  17.2  15.9   Hematocrit 39.0 - 52.0 % 47.5  48.2  46.8   Platelets 150.0 - 400.0 K/uL 174.0  146.0  125     Lab Results  Component Value Date/Time   VD25OH 23 (L) 02/12/2018 02:40 PM    Clinical ASCVD: Yes  The ASCVD Risk score (Arnett DK, et al., 2019) failed to calculate for the following reasons:   The valid total cholesterol range is 130 to 320 mg/dL       12/19/2021    3:23 PM 08/12/2021    3:24 PM 07/01/2021    2:57 PM  Depression screen PHQ 2/9  Decreased Interest 0 3 0  Down, Depressed, Hopeless 0 2 0  PHQ - 2 Score 0 5 0  Altered sleeping 0 3   Tired, decreased energy 0 1   Change in appetite 0 1    Feeling bad or failure about yourself  0 1   Trouble concentrating 0 1   Moving slowly or fidgety/restless 0 0   Suicidal thoughts 0 0   PHQ-9 Score 0 12      Social History   Tobacco Use  Smoking Status Former   Packs/day: 0.10   Years: 10.00   Total pack years: 1.00   Types: Cigars, Cigarettes   Quit date: 08/09/2014   Years since quitting: 7.4  Smokeless Tobacco Never   BP Readings from Last 3 Encounters:  01/21/22 130/78  12/19/21 118/60  12/13/21 119/61   Pulse Readings from Last 3 Encounters:  01/21/22 66  12/19/21 96  12/13/21 77   Wt Readings from Last 3 Encounters:  01/21/22 253 lb 9.6 oz (115 kg)  12/19/21 248 lb 3.2 oz (112.6 kg)  10/08/21 244 lb (110.7 kg)   BMI Readings from Last 3 Encounters:  01/21/22 36.39 kg/m  12/19/21 35.61 kg/m  10/08/21 35.01 kg/m    Assessment/Interventions: Review of patient past medical history, allergies, medications, health status, including review of consultants reports, laboratory and other test data, was performed as part of comprehensive evaluation and provision of chronic care management services.   SDOH:  (Social Determinants of Health) assessments and interventions performed: Yes SDOH Interventions    Flowsheet Row Office Visit from 08/12/2021 in [place] Office Visit from 08/25/2019 in [facility] at [place] from 05/19/2019 in [place] [facility] [place] from 11/27/2016 in [facility] for Infectious Disease Office Visit from 02/11/2016 in [facility] for [facility] Disease  SDOH Interventions       Depression Interventions/Treatment  Currently on Treatment Currently on Treatment Currently on Treatment  [advised to be seen by psychiatry.] Medication, Currently on Treatment, Counseling Counseling, Medication      Financial Resource Strain: Medium Risk (02/20/2021)   Overall Financial Resource Strain (CARDIA)    Difficulty of  Paying Living Expenses: Somewhat hard    SDOH Screenings   Food Insecurity: No Food Insecurity (10/01/2017)  Transportation Needs: No Transportation Needs (10/01/2017)  Alcohol Screen: Low Risk  (04/21/2019)  Depression (PHQ2-9): Low Risk  (12/19/2021)  Financial Resource Strain: Medium Risk (02/20/2021)  Physical Activity: Inactive (10/01/2017)  Social Connections: Moderately Isolated (10/01/2017)  Stress: No Stress Concern Present (10/01/2017)  Tobacco Use: Medium Risk (01/21/2022)    CCM Care Plan  Allergies  Allergen Reactions   Aspirin Swelling   Efavirenz Swelling and Rash    Other reaction(s): anaphylaxis   Ibuprofen Swelling   Nsaids Other (See Comments)    unknwn   Pravastatin     myalgias   Lipitor [Atorvastatin Calcium] Other (See Comments)    Leg pain    Medications Reviewed Today     Reviewed by Lauralyn Primes, Andrews (Registered Medical Assistant) on 01/21/22 at 1520  Med List Status: <None>   Medication Order Taking? Sig Documenting Provider Last Dose Status Informant  acetaminophen (TYLENOL) 500 MG tablet [id number] No Take 1,500 mg by mouth in the morning and at bedtime. [provider] Taking Active Self           Med Note Trinidad Curet, DANA K   Wed Sep 14, 2019  8:16 PM)    ALPRAZolam (XANAX) 1 MG tablet [id number] No Take 1 mg by mouth at bedtime. *May take one tablet up to 4 times daily as needed for anxiety [provider] Taking Active Self  amphetamine-dextroamphetamine (ADDERALL) 30 MG tablet [id number] No Take 30 mg by mouth 3 (three) times daily. [provider] Taking Active Self           Med Note (Hadlee Burback L   Wed Feb 20, 2021  3:16 PM) Patient 39m per day  brexpiprazole (REXULTI) 1 MG TABS tablet [id number] Take 1 mg by mouth at bedtime. [provider] Taking Active Self  Continuous Blood Gluc Receiver (FREESTYLE LIBRE 2 READER) DEVI [id number] Use as instructed  to check blood sugars. Renato Shin, MD Taking  Active   dapagliflozin propanediol (FARXIGA) 10 MG TABS tablet [id number] No Take 1 tablet (10 mg total) by mouth daily before breakfast. Renato Shin, MD Taking Active   Dextromethorphan-buPROPion ER (AUVELITY) 45-105 MG TBCR [id number] No Take 1 tablet by mouth daily at 12 noon. [provider] Taking Active   diclofenac Sodium (VOLTAREN) 1 % GEL [id number]  APPLY 2 GM TO EACH KNEE EVERY MORNING AND EVERY NIGHT AT BEDTIME, AND APPLY 1 GM TO EACH KNEE EVERY AFTERNOON [name], MD  Active   divalproex (DEPAKOTE ER) 500 MG 24 hr tablet [id number] No TAKE 1 TABLET BY MOUTH AT BEDTIME [name], MD Taking Active   Dulaglutide (TRULICITY) 4.5 ME/1.5AX SOPN [id number]  Inject 4.5 mg as directed once a week. Philemon Kingdom, MD  Active   Evolocumab [facility] SURECLICK) 811 MG/ML Darden Palmer [id number] No Inject 140 mg into the skin every 14 (fourteen) days. Skeet Latch, MD Taking Active   ezetimibe (ZETIA) 10 MG tablet [id number] No TAKE 1 TABLET(10 MG) BY MOUTH DAILY Skeet Latch, MD Taking Active   furosemide (LASIX) 40 MG tablet [id number]  TAKE 1 TABLET(40 MG) BY MOUTH DAILY [name], MD  Active   insulin aspart (NOVOLOG FLEXPEN) 100 UNIT/ML FlexPen [id number]  15 units with breakfast and 25 units with supper. Philemon Kingdom, MD  Active   insulin glargine (LANTUS SOLOSTAR) 100 UNIT/ML Solostar Pen [id number] No Inject 100 Units into the skin every morning. Philemon Kingdom, MD Taking Active   Insulin Pen Needle (B-D ULTRAFINE III SHORT PEN) 31G X 8 MM MISC [id number] No USE AS DIRECTED THREE TIMES DAILY Philemon Kingdom, MD Taking Active   levothyroxine (SYNTHROID, LEVOTHROID) 50 MCG tablet [id number] No Take 1 tablet (50 mcg total) by mouth at bedtime. [name], MD Taking Active Self  ondansetron (ZOFRAN) 8 MG tablet [id number] No TAKE 1 TABLET BY MOUTH EVERY 8 HOURS AS NEEDED FOR NAUSEA OR VOMITING  Patient not taking: Reported on 12/19/2021   [name],  MD Not Taking Active   pantoprazole (PROTONIX) 40 MG tablet [id number] No TAKE 1 TABLET(40 MG) BY MOUTH DAILY [name], MD Taking Active   pregabalin (LYRICA) 300 MG capsule [id number]  TAKE 1 CAPSULE(300 MG) BY MOUTH TWICE DAILY [name], MD  Active   protriptyline (VIVACTIL) 10 MG tablet [id number] No Take 10 mg by mouth 3 (three) times daily.  [provider] Taking Active Self           Med Note Evangeline Gula Jan 20, 2017  3:28 PM)    rivaroxaban (XARELTO) 20 MG TABS tablet [id number]  TAKE 1 TABLET(20 MG) BY MOUTH DAILY WITH SUPPER [name], MD  Active   silodosin (RAPAFLO) 8 MG CAPS capsule [id number] No Take 8 mg by mouth daily. [provider] Taking Active   TRIUMEQ 600-50-300 MG tablet [id number] No TAKE 1 TABLET BY MOUTH DAILY Comer, Okey Regal, MD Taking Active   zolpidem (AMBIEN) 10 MG tablet [id number] No Take 10 mg by mouth at bedtime. [provider] Taking Active Self            Patient Active Problem List   Diagnosis Date Noted   Mixed hyperlipidemia 09/19/2021   Type II diabetes mellitus with peripheral circulatory disorder ([place]) 09/19/2021   Insomnia due to mental condition 05/23/2021   Atherosclerosis of native artery of extremity ([place]) 04/25/2021   Chronic nonalcoholic liver  disease 04/25/2021   History of hepatitis B 04/25/2021   HIV positive ([place]) 04/25/2021   Long term (current) use of insulin ([place]) 04/25/2021   Long term (current) use of anticoagulants 53/61/4431   Nonalcoholic steatohepatitis ([place]) 04/25/2021   Type 2 diabetes mellitus with other circulatory complications ([place]) 54/00/8676   Type 2 diabetes mellitus with peripheral angiopathy ([place]) 04/25/2021   Abnormal liver function tests 04/25/2021   Drug-induced myopathy 02/12/2021   Unilateral primary osteoarthritis, right knee 12/15/2019   Generalized weakness 09/14/2019   Severe major depression ([place]) 11/17/2018   Class 2 obesity due to excess  calories without serious comorbidity with body mass index (BMI) of 36.0 to 36.9 in adult 11/17/2018   Anticoagulated 08/24/2018   Dyslipidemia 07/27/2018   Essential hypertension 06/25/2018   Hypogonadism in male 05/10/2018   Numbness 03/31/2018   Diabetic peripheral neuropathy ([place]) 01/25/2018   S/P transmetatarsal amputation of foot, right ([place]) 10/09/2017   Hypothyroidism    Constipation    Obesity, Class III, BMI 40-49.9 (morbid obesity) ([place]) 01/15/2017   DOE (dyspnea on exertion) 01/13/2017   Imbalance 11/18/2016   Osteomyelitis of right foot ([place]) 09/22/2016   Chronic migraine 09/17/2016   Gait abnormality 09/17/2016   Fall 12/05/2015   Toe ulcer, right ([place]) 09/19/2015   Decreased pedal pulses 09/19/2015   OSA on CPAP 09/05/2015   Major depressive disorder, recurrent episode, moderate ([place]) 09/05/2015   Recurrent falls while walking 06/24/2015   H/O migraine 06/22/2015   Sinus headache 06/21/2015   OSA (obstructive sleep apnea) 06/20/2015   History of DVT (deep vein thrombosis) 06/20/2015   History of falling 06/20/2015   Chronic, continuous use of opioids 06/20/2015   Coagulopathy ([place]) 06/20/2015   Elevated CPK 06/20/2015   Pain syndrome, chronic 06/20/2015   Thrombocythemia 06/20/2015   Uncontrolled type 2 diabetes mellitus with hyperglycemia ([place]) 06/20/2015   Low back pain 06/11/2015   Unspecified abnormalities of gait and mobility 06/11/2015   Benign paroxysmal positional vertigo of right ear 05/01/2015   Chronic kidney disease, stage III (moderate) ([place]) 08/09/2014   Renal insufficiency 08/09/2014   Insulin-requiring or dependent type II diabetes mellitus ([place]) 11/07/2013   Diabetes mellitus ([place]) 11/07/2013   Steatosis of liver 09/09/2010   Human immunodeficiency virus (HIV) infection ([place]) 06/04/2006   Herpes zoster 06/04/2006   Depression 06/04/2006   THROMBOPHLEBITIS NOS 06/04/2006   Gastroesophageal reflux disease 06/04/2006   ARTHRITIS, HAND  06/04/2006   Attention deficit hyperactivity disorder 06/04/2006   Abnormal blood chemistry level 06/04/2006    Immunization History  Administered Date(s) Administered   Fluad Quad(high Dose 65+) 11/17/2018, 10/26/2019, 12/05/2020   H1N1 03/09/2008   Hepatitis A 09/09/2010, 05/29/2011   Influenza Split 12/06/2010, 12/04/2011   Influenza Whole 01/26/2004, 12/25/2005, 01/14/2007, 12/22/2007, 11/24/2009   Influenza, High Dose Seasonal PF 12/10/2021   Influenza,inj,Quad PF,6+ Mos 11/04/2012, 11/07/2013, 01/10/2015, 11/12/2015, 11/27/2016, 11/16/2017   Janssen (J&J) SARS-COV-2 Vaccination 07/04/2019   Moderna Sars-Covid-2 Vaccination 01/27/2020   Pneumococcal Conjugate-13 09/03/2006, 11/17/2018   Pneumococcal Polysaccharide-23 03/12/2000, 05/29/2011, 04/29/2012, 12/10/2021   Tdap 08/01/2014   Zoster Recombinat (Shingrix) 12/10/2016, 02/18/2017   Zoster, Live 11/08/2015    Conditions to be addressed/monitored:  HTN, GERD, Hypothyroidism, Neuropathy, MDD, HLD, Type II DM  There are no care plans that you recently modified to display for this patient.       Medication Assistance: None required.  Patient affirms current coverage meets needs.  Compliance/Adherence/Medication fill history: Care Gaps: Diabetic eye exam  Star-Rating Drugs: Dulaglutide (TRULICITY) 4.5  MG/0.5ML SOPN - last filled 12/21/2020 84 days   Patient's preferred pharmacy is:  [facility], [place] [address] Phone: [phone] Fax: [facility] [place] - [name] [place] [name] [place]  [address] Phone: [phone] Fax: [phone]  [id number] [place] [place]Phone: [phone]: [phone] Uses pill box? No -  has own organization methods in a box Pt endorses 100% compliance  We discussed: Benefits of medication synchronization, packaging and delivery as well as enhanced pharmacist oversight with [employer]. Patient decided to: Continue current medication management strategy  Care Plan and Follow Up Patient Decision:  Patient agrees to Care Plan and Follow-up.  Plan: The care management team will reach out to the patient again over the next 30 days.  CBeverly Milch PharmD Clinical Pharmacist  [phone]   Current Barriers:  Elevated lipids  Pharmacist Clinical Goal(s):  Patient will achieve improvement in LDL as evidenced by follow up labs through collaboration with PharmD and provider.   Interventions: 1:1 collaboration with GWendie Agreste MD regarding development and update of comprehensive plan of care as evidenced by provider attestation and co-signature Inter-disciplinary care team collaboration (see longitudinal plan of care) Comprehensive medication review performed; medication list updated in electronic medical record  Hypertension (BP goal <130/80) Controlled, not assessed -Uncontrolled -Current treatment: Amlodipine 552mdaily -Medications previously tried: losartan  -Current home readings: 167/60-70 -Current dietary habits: high sodium intake, high carb and sugar intake - see DM for specifics -Denies hypotensive/hypertensive symptoms -Educated on BP goals and benefits of medications for prevention of heart attack, stroke and kidney damage; Exercise goal of 150 minutes per week; Importance of home blood pressure monitoring; -Counseled to monitor BP at home a few times per week, document, and provide log at future appointments -Recommended to continue current medication Previous history of losartan with unknown d/c. Recent elevated BP in office as well as at home.  Already having some fluid retention with swelling noted in feet - would  be hesitant to increase amlodipine for this reason.  CMA to follow up on BP in 30 days and we will assess at that time before adding new agents.    Hyperlipidemia: (LDL goal < 70) 08/21/21 -Uncontrolled, recently started Repatha about 3 months ago via lipid clinic -Current treatment: Zetia 10109maily Appropriate, Query effective, Repatha 140 mg into the skin every other week Appropriate, Query effective, -Medications previously tried: atorvastatin (leg pain)  -Current dietary patterns: unchanged see previous -Current exercise habits: minimal  -Educated on Cholesterol goals;  Importance of limiting foods high in cholesterol; Benefits of Repatha, importance of adherence.  It is not costing him anything so cost will not be a barrier for him. -Recommended to continue current medication Continue to follow with lipid clinic. He has FU visit scheduled there in August, would recommend lipid panel to determine efficacy of Repatha. Encouraged him to continue to he adherent, eager to see results of next lipid panel.   Update 04/25/21 LDL elevated ASCVD risk 24.3% - high risk.  He cannot tolerate statins in the past.  He missed his scheduled appointment with lipid clinic with cardiologist.  He needs to have some kind of more advanced treatment for  lipids to reduce CV risk.  Rescheduled visit with lipid clinic.  Would recommend Repatha or Praluent if patient is agreeable. Will halep with patient assistance if needed. FU on visit next month to determine if further intervention is needed. Continue to work on lifestyle mods.    Diabetes (A1c goal <7%) 08/21/21 -Controlled, based on most recent A1c of 7.0 -Current medications: Trulicity 4.5 YT/0.1SW Appropriate, Query effective, ,  Lantus 100 units every morning Appropriate, Effective, Novolog 15 units with breakfast and 25 units with supper Appropriate, Effective, Safe, Accessible Farxiga 21m daily Appropriate, Effective, Safe, Accessible Lyrica  1564m2 capsules bid Appropriate, Effective, Safe, Accessible -Medications previously tried: metformin  -Current home glucose readings fasting glucose: has not had Freestyle lately so not checking, plans to refill after our visit today  -Denies hypoglycemic/hyperglycemic symptoms -Educated on A1c and blood sugar goals; Prevention and management of hypoglycemic episodes; Benefits of routine self-monitoring of blood sugar; Counseled to check feet daily and get yearly eye exams -He is due for an eye exam, will have CMA reach out and offer [facility] -Recommended to continue current medication Now seeing Dr. GhCruzita Lederert Endocrine, has FU visit next month for A1c.  Update 04/24/21 165, 199, 212 are random glucose readings given to me today. A1c improved to 7.0 - he denies any hypoglycemia. He continues to work on diet cutting back on gatorades, ice cream floats.  He does report he drinks a substantial amount of milk - we discussed the carbohydrate and sugar content in this and he is going to try and cut back on milk.  Would caution on over basalization with Lantus insulin over 0.47m22mg.  We could increase the amount of meal time insulin based on meals and cut back on Lantus if he is experiencing episodes of hypoglycemia with controlled or elevated A1c. FU next month on glucose readings and diet changes with CMA.   Depression/Anxiety (Goal: Minimize symptoms) -Controlled, not assessed today -Current treatment: Trintellix 86m19mily Trintellix 47mg 53mly Rexulti 1mg d38my Alprazolam 1mg pr16mivactil 10mg ti59medications previously tried/failed: Pristiq -PHQ9:  PHQ9 SCORE ONLY 02/04/2021 12/27/2020 12/05/2020  PHQ-9 Total Score _0 -Sees Dr. RupinderChucky Mayer year for follow up. -Educated on Benefits of medication for symptom control -Recommended to continue current medication Would hesitate to make changes as he is seen by psychology - of note,  Trintellix maximum dose is 86mg per51m - will consult with Dr. Kaur to mToy Caresure she is aware of this dose.  GERD (Goal: Minimize symptoms) -Controlled, not assessed today -Current treatment  Pantoprazole 40mg dail88medications previously tried: none noted -Discussed appropriate timing of medication and difference between other options such as tums and Pepcid.  -Recommended to continue current medication Work on trigger foods and medication timing.  Hypothyroidism (Goal: Maintain TSH) -Controlled, not assessed today -Current treatment  Levothyroxine 50mcg dail56medications previously tried: none noted -Most recent TSH was WNL - he takes this medication in the evening usually with milk.  -Recommended to continue current medication Counseled on appropriate timing of this medication, would hesitate to make big changes with TSH normal.  However if it continues to fluctuate he may need to make changes on medication timing to ensure adequate and consistent absorption.  HIV  -Controlled, not assessed today -Current treatment  Triumeq 600-50-300 daily -Medications previously tried: Stribild, Genvoya, AtJorje Guild-Recommended to continue current medication Assessed patient finances. He gets through [facility] [place] with [place] copay. Continue as prescribed.  Patient Goals/Self-Care Activities Patient will:  - take medications as prescribed as evidenced by patient report and record review check blood pressure a few times per week, document, and provide at future appointments engage in dietary modifications by limiting gatorades to one per day and work on cutting back on servings of ice cream  Follow Up Plan: The care management team will reach out to the patient again over the next 30 days.

## 2022-02-05 ENCOUNTER — Telehealth: Payer: PPO

## 2022-02-06 DIAGNOSIS — N401 Enlarged prostate with lower urinary tract symptoms: Secondary | ICD-10-CM | POA: Diagnosis not present

## 2022-02-06 DIAGNOSIS — G4733 Obstructive sleep apnea (adult) (pediatric): Secondary | ICD-10-CM | POA: Diagnosis not present

## 2022-02-06 DIAGNOSIS — R351 Nocturia: Secondary | ICD-10-CM | POA: Diagnosis not present

## 2022-02-06 DIAGNOSIS — R3914 Feeling of incomplete bladder emptying: Secondary | ICD-10-CM | POA: Diagnosis not present

## 2022-02-13 ENCOUNTER — Other Ambulatory Visit: Payer: Self-pay | Admitting: Student

## 2022-02-13 ENCOUNTER — Other Ambulatory Visit: Payer: Self-pay | Admitting: Cardiovascular Disease

## 2022-02-13 DIAGNOSIS — E785 Hyperlipidemia, unspecified: Secondary | ICD-10-CM

## 2022-02-13 NOTE — Telephone Encounter (Signed)
Rx(s) sent to pharmacy electronically.  

## 2022-02-14 ENCOUNTER — Other Ambulatory Visit: Payer: Self-pay | Admitting: Family Medicine

## 2022-02-14 DIAGNOSIS — I824Y9 Acute embolism and thrombosis of unspecified deep veins of unspecified proximal lower extremity: Secondary | ICD-10-CM

## 2022-02-19 ENCOUNTER — Telehealth: Payer: PPO

## 2022-02-27 ENCOUNTER — Other Ambulatory Visit: Payer: Self-pay | Admitting: Student

## 2022-02-28 NOTE — Telephone Encounter (Signed)
You had sent me a CC message asking me to follow up on something for this pt but I can no longer find that message can you recall what that was about so I can update and discuss with the patient?

## 2022-03-07 DIAGNOSIS — F900 Attention-deficit hyperactivity disorder, predominantly inattentive type: Secondary | ICD-10-CM | POA: Diagnosis not present

## 2022-03-07 DIAGNOSIS — F3342 Major depressive disorder, recurrent, in full remission: Secondary | ICD-10-CM | POA: Diagnosis not present

## 2022-03-07 DIAGNOSIS — F410 Panic disorder [episodic paroxysmal anxiety] without agoraphobia: Secondary | ICD-10-CM | POA: Diagnosis not present

## 2022-03-09 DIAGNOSIS — G4733 Obstructive sleep apnea (adult) (pediatric): Secondary | ICD-10-CM | POA: Diagnosis not present

## 2022-03-17 ENCOUNTER — Other Ambulatory Visit: Payer: Self-pay | Admitting: Family Medicine

## 2022-03-17 DIAGNOSIS — Z8669 Personal history of other diseases of the nervous system and sense organs: Secondary | ICD-10-CM

## 2022-03-19 ENCOUNTER — Other Ambulatory Visit: Payer: Self-pay

## 2022-03-19 DIAGNOSIS — E0849 Diabetes mellitus due to underlying condition with other diabetic neurological complication: Secondary | ICD-10-CM

## 2022-03-19 MED ORDER — BD PEN NEEDLE SHORT U/F 31G X 8 MM MISC: 2 refills | Status: DC

## 2022-03-19 MED ORDER — DAPAGLIFLOZIN PROPANEDIOL 10 MG PO TABS: 10.0000 mg | ORAL_TABLET | Freq: Every day | ORAL | 3 refills | Status: DC

## 2022-03-26 ENCOUNTER — Other Ambulatory Visit: Payer: PPO

## 2022-03-27 NOTE — Progress Notes (Signed)
This encounter was created in error - please disregard.

## 2022-03-31 ENCOUNTER — Other Ambulatory Visit: Payer: PPO

## 2022-04-02 ENCOUNTER — Telehealth: Payer: Self-pay | Admitting: Nutrition

## 2022-04-02 NOTE — Telephone Encounter (Signed)
Patient left message on my voice mail to call him.  I returned his call, but had to leave a message for him to call me back

## 2022-04-08 ENCOUNTER — Telehealth: Payer: Self-pay | Admitting: Nutrition

## 2022-04-08 NOTE — Telephone Encounter (Signed)
Message left on my phone to call him. No reason given.  Tried calling him back X2 again today.

## 2022-04-09 ENCOUNTER — Telehealth: Payer: Self-pay | Admitting: Pharmacy Technician

## 2022-04-09 ENCOUNTER — Ambulatory Visit: Payer: PPO | Admitting: Internal Medicine

## 2022-04-09 DIAGNOSIS — G4733 Obstructive sleep apnea (adult) (pediatric): Secondary | ICD-10-CM | POA: Diagnosis not present

## 2022-04-09 NOTE — Telephone Encounter (Signed)
Pharmacy Patient Advocate Encounter   Received fax from [facility] that prior authorization for Trulicity [id number] is required/requested.   Faxed back requested information and form on 04/09/22 to [phone] to [facility] Request ID [id number]

## 2022-04-11 ENCOUNTER — Telehealth: Payer: Self-pay | Admitting: Dietician

## 2022-04-11 ENCOUNTER — Other Ambulatory Visit: Payer: Self-pay | Admitting: Internal Medicine

## 2022-04-11 DIAGNOSIS — R3914 Feeling of incomplete bladder emptying: Secondary | ICD-10-CM | POA: Diagnosis not present

## 2022-04-11 DIAGNOSIS — N401 Enlarged prostate with lower urinary tract symptoms: Secondary | ICD-10-CM | POA: Diagnosis not present

## 2022-04-11 DIAGNOSIS — E1122 Type 2 diabetes mellitus with diabetic chronic kidney disease: Secondary | ICD-10-CM

## 2022-04-11 DIAGNOSIS — N3943 Post-void dribbling: Secondary | ICD-10-CM | POA: Diagnosis not present

## 2022-04-11 NOTE — Telephone Encounter (Signed)
Patient called stating that he is having a hard time controlling his blood glucose and would like a nutrition referral.  Appointment made with myself on Monday.  Referral requested.  Antonieta Iba, RD, LDN, CDCES

## 2022-04-14 ENCOUNTER — Other Ambulatory Visit: Payer: PPO

## 2022-04-14 ENCOUNTER — Other Ambulatory Visit: Payer: Self-pay

## 2022-04-14 ENCOUNTER — Encounter: Payer: Self-pay | Admitting: Dietician

## 2022-04-14 ENCOUNTER — Encounter: Payer: PPO | Attending: Internal Medicine | Admitting: Dietician

## 2022-04-14 ENCOUNTER — Telehealth: Payer: Self-pay | Admitting: Family Medicine

## 2022-04-14 ENCOUNTER — Other Ambulatory Visit (HOSPITAL_COMMUNITY)
Admission: RE | Admit: 2022-04-14 | Discharge: 2022-04-14 | Disposition: A | Discharge status: Home / Self Care | Payer: PPO | Source: Ambulatory Visit | Attending: Internal Medicine | Admitting: Internal Medicine

## 2022-04-14 VITALS — Ht 70.0 in | Wt 250.0 lb

## 2022-04-14 DIAGNOSIS — E119 Type 2 diabetes mellitus without complications: Secondary | ICD-10-CM | POA: Insufficient documentation

## 2022-04-14 DIAGNOSIS — N1832 Chronic kidney disease, stage 3b: Secondary | ICD-10-CM | POA: Diagnosis not present

## 2022-04-14 DIAGNOSIS — Z113 Encounter for screening for infections with a predominantly sexual mode of transmission: Secondary | ICD-10-CM

## 2022-04-14 DIAGNOSIS — Z79899 Other long term (current) drug therapy: Secondary | ICD-10-CM

## 2022-04-14 DIAGNOSIS — B20 Human immunodeficiency virus [HIV] disease: Secondary | ICD-10-CM

## 2022-04-14 NOTE — Patient Instructions (Addendum)
Low salt  Avoid added salt  Avoid processed food Avoid regular soda  If you choose soda, avoid Pepsi or other dark soda   Eye appointment  You said you would like to have a more consistent schedule.  Consider going to bed at the same time every night (take your sleeping medicine at the same time each nigh)  Consider consistent meal times during the day  Consider daily exercise  Increase your vegetable intake

## 2022-04-14 NOTE — Telephone Encounter (Signed)
Please put in a referral for dietary consult.  Thank you
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
## 2022-04-14 NOTE — Telephone Encounter (Signed)
Caller name: Marcelina Morel Desk  On DPR?: Yes  Call back number:   Provider they see: [name], MD  Reason for call: Called pt to schedule visit no answer and left message.

## 2022-04-15 ENCOUNTER — Encounter: Payer: Self-pay | Admitting: Family Medicine

## 2022-04-15 ENCOUNTER — Other Ambulatory Visit: Payer: Self-pay

## 2022-04-15 ENCOUNTER — Other Ambulatory Visit: Payer: Self-pay | Admitting: Internal Medicine

## 2022-04-15 DIAGNOSIS — E782 Mixed hyperlipidemia: Secondary | ICD-10-CM

## 2022-04-15 DIAGNOSIS — E1122 Type 2 diabetes mellitus with diabetic chronic kidney disease: Secondary | ICD-10-CM

## 2022-04-15 DIAGNOSIS — B20 Human immunodeficiency virus [HIV] disease: Secondary | ICD-10-CM

## 2022-04-15 LAB — CBC WITH DIFFERENTIAL/PLATELET
Absolute Monocytes: 658 cells/uL (ref 200–950)
Basophils Absolute: 28 cells/uL (ref 0–200)
Basophils Relative: 0.4 %
Eosinophils Absolute: 182 cells/uL (ref 15–500)
Eosinophils Relative: 2.6 %
HCT: 50 % (ref 38.5–50.0)
Hemoglobin: 17.7 g/dL — ABNORMAL HIGH (ref 13.2–17.1)
Lymphs Abs: 2919 cells/uL (ref 850–3900)
MCH: 33.3 pg — ABNORMAL HIGH (ref 27.0–33.0)
MCHC: 35.4 g/dL (ref 32.0–36.0)
MCV: 94.2 fL (ref 80.0–100.0)
MPV: 10.2 fL (ref 7.5–12.5)
Monocytes Relative: 9.4 %
Neutro Abs: 3213 cells/uL (ref 1500–7800)
Neutrophils Relative %: 45.9 %
Platelets: 146 10*3/uL (ref 140–400)
RBC: 5.31 10*6/uL (ref 4.20–5.80)
RDW: 14 % (ref 11.0–15.0)
Total Lymphocyte: 41.7 %
WBC: 7 10*3/uL (ref 3.8–10.8)

## 2022-04-15 LAB — URINE CYTOLOGY ANCILLARY ONLY
Chlamydia: NEGATIVE
Comment: NEGATIVE
Comment: NORMAL
Neisseria Gonorrhea: NEGATIVE

## 2022-04-15 LAB — COMPLETE METABOLIC PANEL WITH GFR
AG Ratio: 1.5 (calc) (ref 1.0–2.5)
ALT: 28 U/L (ref 9–46)
AST: 19 U/L (ref 10–35)
Albumin: 4.3 g/dL (ref 3.6–5.1)
Alkaline phosphatase (APISO): 115 U/L (ref 35–144)
BUN/Creatinine Ratio: 15 (calc) (ref 6–22)
BUN: 23 mg/dL (ref 7–25)
CO2: 24 mmol/L (ref 20–32)
Calcium: 9.5 mg/dL (ref 8.6–10.3)
Chloride: 104 mmol/L (ref 98–110)
Creat: 1.53 mg/dL — ABNORMAL HIGH (ref 0.70–1.35)
Globulin: 2.9 g/dL (calc) (ref 1.9–3.7)
Glucose, Bld: 194 mg/dL — ABNORMAL HIGH (ref 65–99)
Potassium: 3.7 mmol/L (ref 3.5–5.3)
Sodium: 141 mmol/L (ref 135–146)
Total Bilirubin: 0.7 mg/dL (ref 0.2–1.2)
Total Protein: 7.2 g/dL (ref 6.1–8.1)
eGFR: 49 mL/min/{1.73_m2} — ABNORMAL LOW (ref >=60)

## 2022-04-15 LAB — HIV-1 RNA QUANT-NO REFLEX-BLD
HIV 1 RNA Quant: NOT DETECTED Copies/mL
HIV-1 RNA Quant, Log: NOT DETECTED Log cps/mL

## 2022-04-15 LAB — LIPID PANEL
Cholesterol: 96 mg/dL (ref <200)
HDL: 47 mg/dL (ref >=40)
LDL Cholesterol (Calc): 11 mg/dL (calc)
Non-HDL Cholesterol (Calc): 49 mg/dL (calc) (ref <130)
Total CHOL/HDL Ratio: 2 (calc) (ref <5.0)
Triglycerides: 359 mg/dL — ABNORMAL HIGH (ref <150)

## 2022-04-15 LAB — T-HELPER CELL (CD4) - (RCID CLINIC ONLY)
CD4 % Helper T Cell: 37 % (ref 33–65)
CD4 T Cell Abs: 954 /uL (ref 400–1790)

## 2022-04-15 LAB — RPR: RPR Ser Ql: NONREACTIVE

## 2022-04-15 MED ORDER — ONDANSETRON HCL 8 MG PO TABS: 8.0000 mg | ORAL_TABLET | Freq: Three times a day (TID) | ORAL | 1 refills | Status: DC | PRN

## 2022-04-17 ENCOUNTER — Telehealth: Payer: Self-pay

## 2022-04-17 NOTE — Patient Outreach (Signed)
  Care Coordination   04/17/2022 Name: Daniel [name] MRN: [id number] DOB: 1955-07-18   Care Coordination Outreach Attempts:  An unsuccessful telephone outreach was attempted today to offer the patient information about available care coordination services as a benefit of their health plan.   Follow Up Plan:  Additional outreach attempts will be made to offer the patient care coordination information and services.   Encounter Outcome:  No Answer   Care Coordination Interventions:  No, not indicated    Jone Baseman, RN, MSN [facility] Care Management Coordinator Direct Line [phone]

## 2022-04-22 ENCOUNTER — Telehealth: Payer: Self-pay

## 2022-04-22 ENCOUNTER — Encounter: Payer: Self-pay | Admitting: Family Medicine

## 2022-04-22 ENCOUNTER — Telehealth: Payer: Self-pay | Admitting: Family Medicine

## 2022-04-22 NOTE — Patient Outreach (Signed)
  Care Coordination   04/22/2022 Name: Daniel [name] MRN: [id number] DOB: August 20, 1955   Care Coordination Outreach Attempts:  A second unsuccessful outreach was attempted today to offer the patient with information about available care coordination services as a benefit of their health plan.     Follow Up Plan:  Additional outreach attempts will be made to offer the patient care coordination information and services.   Encounter Outcome:  No Answer   Care Coordination Interventions:  No, not indicated    Jone Baseman, RN, MSN [facility] Care Management Coordinator Direct Line [phone]

## 2022-04-22 NOTE — Telephone Encounter (Signed)
Caller name: ARIZ LIFSEY  On DPR?: Yes  Call back number: [phone] (mobile)  Provider they see: [name], MD  Reason for call:  Patient calling stated that he need a PA for his ondansetron (ZOFRAN) 8 MG tablet.

## 2022-04-22 NOTE — Patient Instructions (Signed)
Visit Information  Thank you for taking time to visit with me today. Please don't hesitate to contact me if I can be of assistance to you.   Following are the goals we discussed today:   Goals Addressed             This Visit's Progress    Diabetes Management       Patient Goals/Self Care Activities: -Patient/Caregiver will self-administer medications as prescribed as evidenced by self-report/primary caregiver report  -Patient/Caregiver will attend all scheduled provider appointments as evidenced by clinician review of documented attendance to scheduled appointments and patient/caregiver report -Patient/Caregiver verbalizes understanding of plan  -check blood sugar at prescribed times -check blood sugar if I feel it is too high or too low -record values and write them down take them to all doctor visits  -schedule appointment with eye doctor   Interventions Today    Flowsheet Row Most Recent Value  Chronic Disease   Chronic disease during today's visit Diabetes  General Interventions   General Interventions Discussed/Reviewed General Interventions Discussed, Annual Eye Exam, Health Screening  Health Screening Colonoscopy, Prostate  Education Interventions   Education Provided Provided Education  Provided Verbal Education On Nutrition, Blood Sugar Monitoring, Medication, When to see the doctor  [place] Discussed, Depression  Nutrition Interventions   Nutrition Discussed/Reviewed Nutrition Discussed, Decreasing sugar intake  Pharmacy Interventions   Pharmacy Dicussed/Reviewed Medications and their functions              Our next appointment is by telephone on 06/17/22 at 2:00 pm  Please call the care guide team at [phone] if you need to cancel or reschedule your appointment.   If you are experiencing a Mental Health or [place] or need someone to talk to, please call the Suicide  and Crisis Lifeline: 988   The patient verbalized understanding of instructions, educational materials, and care plan provided today and agreed to receive a mailed copy of patient instructions, educational materials, and care plan.   The patient has been provided with contact information for the care management team and has been advised to call with any health related questions or concerns.   Jone Baseman, RN, MSN [facility] Care Management Coordinator Direct Line [phone]

## 2022-04-22 NOTE — Patient Outreach (Addendum)
  Care Coordination   Initial Visit Note   04/22/2022 Name: Daniel [name] MRN: [id number] DOB: 09-27-1955  Daniel [name] is a 67 y.o. year old male who sees [name], MD for primary care. I spoke with  [name] by phone today.   What matters to the patients health and wellness today?  Health and Diabetes Management    Goals Addressed             This Visit's Progress    Diabetes Management       Patient Goals/Self Care Activities: -Patient/Caregiver will self-administer medications as prescribed as evidenced by self-report/primary caregiver report  -Patient/Caregiver will attend all scheduled provider appointments as evidenced by clinician review of documented attendance to scheduled appointments and patient/caregiver report -Patient/Caregiver verbalizes understanding of plan  -check blood sugar at prescribed times -check blood sugar if I feel it is too high or too low -record values and write them down take them to all doctor visits  -schedule appointment with eye doctor   Interventions Today    Flowsheet Row Most Recent Value  Chronic Disease   Chronic disease during today's visit Diabetes  General Interventions   General Interventions Discussed/Reviewed General Interventions Discussed, Annual Eye Exam, Health Screening  Health Screening Colonoscopy, Prostate  Education Interventions   Education Provided Provided Education  Provided Verbal Education On Nutrition, Blood Sugar Monitoring, Medication, When to see the doctor  Mental Health Interventions   Mental Health Discussed/Reviewed Mental Health Discussed, Depression  Nutrition Interventions   Nutrition Discussed/Reviewed Nutrition Discussed, Decreasing sugar intake  Pharmacy Interventions   Pharmacy Dicussed/Reviewed Medications and their functions              SDOH assessments and interventions completed:  Yes  SDOH Interventions Today    Flowsheet Row Most Recent Value  SDOH  Interventions   Food Insecurity Interventions Intervention Not Indicated  Transportation Interventions Intervention Not Indicated        Care Coordination Interventions:  Yes, provided   Follow up plan: Follow up call scheduled for April    Encounter Outcome:  Pt. Visit Completed   Jone Baseman, RN, MSN [facility] Care Management Coordinator Direct Line [phone]

## 2022-04-29 ENCOUNTER — Ambulatory Visit (INDEPENDENT_AMBULATORY_CARE_PROVIDER_SITE_OTHER): Payer: PPO | Admitting: Internal Medicine

## 2022-04-29 ENCOUNTER — Other Ambulatory Visit: Payer: Self-pay

## 2022-04-29 ENCOUNTER — Encounter: Payer: Self-pay | Admitting: Internal Medicine

## 2022-04-29 VITALS — BP 122/77 | HR 95 | Temp 98.1°F | Wt 249.4 lb

## 2022-04-29 DIAGNOSIS — N289 Disorder of kidney and ureter, unspecified: Secondary | ICD-10-CM | POA: Diagnosis not present

## 2022-04-29 DIAGNOSIS — Z21 Asymptomatic human immunodeficiency virus [HIV] infection status: Secondary | ICD-10-CM | POA: Diagnosis not present

## 2022-04-29 DIAGNOSIS — Z113 Encounter for screening for infections with a predominantly sexual mode of transmission: Secondary | ICD-10-CM | POA: Diagnosis not present

## 2022-04-29 NOTE — Assessment & Plan Note (Signed)
Screened negative 

## 2022-04-29 NOTE — Progress Notes (Signed)
   Subjective:    Patient ID: Daniel [name], male    DOB: [birth year], 67 y.o.   MRN: [id number]  HPI Here for follow up of HIV He continues on Triumeq with no missed doses.  No issues getting or taking his medication.  He continues to see psychiatry.  No new issues today.    Review of Systems  Constitutional:  Negative for fatigue.  Gastrointestinal:  Negative for diarrhea and nausea.  Skin:  Negative for rash.       Objective:   Physical Exam Pulmonary:     Effort: Pulmonary effort is normal.  Neurological:     Mental Status: He is alert.   SH" no tobacco        Assessment & Plan:

## 2022-04-29 NOTE — Assessment & Plan Note (Signed)
Stable creat and he follows with nephrology.  No dose changes indicated.

## 2022-04-29 NOTE — Assessment & Plan Note (Signed)
He continues to do well, no changes indicated.  Labs reviewed with him.  He can rtc in 1 year.

## 2022-05-01 ENCOUNTER — Telehealth: Payer: Self-pay

## 2022-05-01 ENCOUNTER — Other Ambulatory Visit (HOSPITAL_COMMUNITY): Payer: Self-pay

## 2022-05-01 NOTE — Telephone Encounter (Signed)
Pharmacy Patient Advocate Encounter   Received notification that prior authorization for Ondansetron '8mg'$  is required/requested.  Per Test Claim: Prior authorization required   PA submitted on 05/01/22 to (ins) [employer] via CoverMyMeds Key  # [id number] Status is pending  [phone] phone [phone] fax

## 2022-05-02 ENCOUNTER — Telehealth: Payer: Self-pay | Admitting: Family Medicine

## 2022-05-02 NOTE — Telephone Encounter (Signed)
Encourage patient to contact the pharmacy for refills or they can request refills through [facility] TO: [place], [place] - [name]   MEDICATION NAME & DOSE: ondansetron (ZOFRAN) 8 MG tablet    NOTES/COMMENTS FROM PATIENT: Pt states pharmacy still waiting on Prior Auth.     [place] office please notify patient: It takes 48-72 hours to process rx refill requests Ask patient to call pharmacy to ensure rx is ready before heading there.

## 2022-05-02 NOTE — Telephone Encounter (Signed)
Left VM for pt stating that Natalie our CPHT submitted the PA yesterday and it is still pending and that they have 24 to 72 hours for a response .

## 2022-05-07 ENCOUNTER — Telehealth: Payer: Self-pay | Admitting: Family Medicine

## 2022-05-07 DIAGNOSIS — G720 Drug-induced myopathy: Secondary | ICD-10-CM

## 2022-05-07 NOTE — Progress Notes (Signed)
[facility]) [place] Team Statin Quality Measure Assessment  05/07/2022  Daniel [name] [facility] 04/28/1955 [id number]  Per review of chart and payor information, patient has a diagnosis of diabetes but is not currently filling a statin prescription.  This places patient into the Statin Use In Patients with Diabetes (SUPD) measure for CMS.    Patient has documented trials of statins with reported statin-induced myopathy; the last corresponding CPT codes that would exclude patient from SUPD measure was on 10/08/2021. Repatha on file. Next appointment with endocrinologist on 05/08/2022. If deemed clinically appropriate, please consider associating an exclusion code.   The ASCVD Risk score (Arnett DK, et al., 2019) failed to calculate for the following reasons:   The valid total cholesterol range is 130 to 320 mg/dL      Component Value Date/Time   CHOL 96 04/14/2022 1529   CHOL 100 10/08/2021 1443   TRIG 359 (H) 04/14/2022 1529   HDL 47 04/14/2022 1529   HDL 49 10/08/2021 1443   CHOLHDL 2.0 04/14/2022 1529   VLDL 22 08/09/2015 0956   LDLCALC 11 04/14/2022 1529    Please consider ONE of the following recommendations:  Initiate high intensity statin Atorvastatin 40 mg once daily, #90, 3 refills   Rosuvastatin 20 mg once daily, #90, 3 refills    Initiate moderate intensity          statin with reduced frequency if prior          statin intolerance 1x weekly, #13, 3 refills   2x weekly, #26, 3 refills   3x weekly, #39, 3 refills    Code for past statin intolerance or  other exclusions (required annually)  Provider Requirements: Associate code during an office visit or telehealth encounter  Drug Induced Myopathy G72.0   Myopathy, unspecified G72.9   Myositis, unspecified M60.9   Rhabdomyolysis M62.82   Cirrhosis of liver K74.69   Prediabetes R73.03   PCOS E28.2   Thank you for allowing [facility] pharmacy to be a part of this patient's care. Kristeen Miss,  PharmD Clinical Pharmacist [place] Cell: [phone]

## 2022-05-07 NOTE — Telephone Encounter (Signed)
Called patient to schedule Medicare Annual Wellness Visit (AWV). Left message for patient to call back and schedule Medicare Annual Wellness Visit (AWV).  Last date of AWV: 04/21/2019   Please schedule an AWVS appointment at any time with Health Coach.  If any questions, please contact me at [phone].    Thank you,  [place] Direct dial  [phone]

## 2022-05-08 ENCOUNTER — Encounter: Payer: Self-pay | Admitting: Neurology

## 2022-05-08 ENCOUNTER — Encounter: Payer: Self-pay | Admitting: Internal Medicine

## 2022-05-08 ENCOUNTER — Ambulatory Visit: Payer: PPO | Admitting: Internal Medicine

## 2022-05-08 VITALS — BP 128/82 | HR 75 | Ht 70.0 in | Wt 247.8 lb

## 2022-05-08 DIAGNOSIS — E782 Mixed hyperlipidemia: Secondary | ICD-10-CM | POA: Diagnosis not present

## 2022-05-08 DIAGNOSIS — G720 Drug-induced myopathy: Secondary | ICD-10-CM

## 2022-05-08 DIAGNOSIS — N1831 Chronic kidney disease, stage 3a: Secondary | ICD-10-CM

## 2022-05-08 DIAGNOSIS — Z794 Long term (current) use of insulin: Secondary | ICD-10-CM | POA: Diagnosis not present

## 2022-05-08 DIAGNOSIS — E1122 Type 2 diabetes mellitus with diabetic chronic kidney disease: Secondary | ICD-10-CM

## 2022-05-08 LAB — POCT GLYCOSYLATED HEMOGLOBIN (HGB A1C): Hemoglobin A1C: 6.6 % — AB (ref 4.0–5.6)

## 2022-05-08 MED ORDER — SEMAGLUTIDE(0.25 OR 0.5MG/DOS) 2 MG/3ML ~~LOC~~ SOPN: PEN_INJECTOR | SUBCUTANEOUS | 3 refills | Status: DC

## 2022-05-08 NOTE — Progress Notes (Signed)
Patient ID: Daniel [name], male   DOB: [birth year], 67 y.o.   MRN: [id number]  HPI: Daniel [name] is a 67 y.o.-year-old male, returning for follow-up for DM2, dx in 2015, insulin-dependent since 2016, uncontrolled, with complications (PAD, foot ulcer, status post right transmetatarsal amputation, stage IIIa CKD, PN). Pt. previously saw [name], but last visit with me 4 months ago.  Interim history: He has increased urination, no blurry vision, chest pain. Occasional nausea. He has dry and watery eyes -he feels that this is related to the CPAP machine.  Reviewed HbA1c: Lab Results  Component Value Date   HGBA1C 6.4 (A) 01/21/2022   HGBA1C 6.6 (A) 09/19/2021   HGBA1C 7.0 (A) 04/11/2021   HGBA1C 7.2 (A) 01/09/2021   HGBA1C 7.5 (A) 10/08/2020   HGBA1C 6.3 (A) 05/07/2020   HGBA1C 6.2 (A) 08/24/2019   HGBA1C 7.2 (A) 05/11/2019   HGBA1C 7.5 (A) 02/01/2019   HGBA1C 7.7 (A) 11/08/2018   Pt is on a regimen of: - Farxiga 10 mg before b'fast - Trulicity 4.5 mg weekly - not covered this year - Lantus 100 >> 80 units  in a.m.  - Novolog 15-20 units in a.m. and 25-30 units before dinner  He is on a CGM:  Pred.: - am: 100-200 >> 54-150 - 2h after b'fast: n/c - before lunch: n/c - 2h after lunch: n/c - before dinner: 100-250 >> 100-180, 200, 300 - 2h after dinner: n/c - bedtime: n/c - nighttime: n/c Lowest sugar was 50s (took insulin and did not eat) >> 54 >> 55; he has hypoglycemia awareness <60.  Highest sugar was 400s >> 300 >> 314.  He drinks 4-5 gallons of 2% milk a week >> trying to cut down.  He was previously drinking whole milk. He sees nutritionist. He eats at night, eats oatmeal + brown sugar.  - + CKD, last BUN/creatinine:  Lab Results  Component Value Date   BUN 23 04/14/2022   BUN 32 (H) 12/19/2021   CREATININE 1.53 (H) 04/14/2022   CREATININE 1.86 (H) 12/19/2021  He is not on an ACE inhibitor/ARB.  -+ HL; last set of lipids: Lab Results  Component Value Date    CHOL 96 04/14/2022   HDL 47 04/14/2022   LDLCALC 11 04/14/2022   TRIG 359 (H) 04/14/2022   CHOLHDL 2.0 04/14/2022  He is on Zetia 10 mg daily, Repatha.  He is intolerant to statins due to myalgias.   He does have a history of NASH.  - last eye exam was in 2023. No DR reportedly. Dr. Gershon Crane.  - + numbness and tingling in his feet.  Last foot exam was 12/19/2021.  He is on Lyrica 300 mg twice a day.  He does not have a hypothyroidism - this is Rx'ed by his psychiatrist - Dr. Toy Care. Pt is on levothyroxine 50 mcg daily, taken: - in am - fasting - 15-20 min from b'fast  Reviewed his latest TSH levels: Lab Results  Component Value Date   TSH 0.47 05/07/2020   TSH 0.108 (L) 09/14/2019   TSH 0.736 09/17/2016   TSH 0.19 (L) 07/11/2015   TSH 1.067 06/21/2013   TSH 2.762 01/01/2007   He also has a history of HTN, liver disease, OSA, HIV, ADHD, on chronic opioids.  ROS: + see HPI  Past Medical History:  Diagnosis Date   ADHD (attention deficit hyperactivity disorder)    Anxiety    Chronic kidney disease    Clotting disorder ([place])  Depression    Diabetes mellitus without complication ([place])    Diabetes mellitus, type II ([place])    Dizziness 03/17/2015   Essential hypertension 06/25/2018   GERD (gastroesophageal reflux disease)    HIV disease ([name])    HIV infection ([name])    Liver disease    OSA (obstructive sleep apnea) 07/25/2015   Uses CPAP regularly   Peripheral vascular disease ([place])    Ulcer    Past Surgical History:  Procedure Laterality Date   AMPUTATION Right 10/02/2017   Procedure: RIGHT TRANSMETATARSAL AMPUTATION;  Surgeon: Leandrew Koyanagi, MD;  Location: [facility];  Service: Orthopedics;  Laterality: Right;   SMALL INTESTINE SURGERY     STOMACH SURGERY     TOE AMPUTATION Right 08/2016   right great toe   Social History   Socioeconomic History   Marital status: Single    Spouse name: Not on file   Number of children: Not on file   Years of education: Not on file    Highest education level: Not on file  Occupational History    Comment: DISABILITY  Tobacco Use   Smoking status: Former    Packs/day: 0.10    Years: 10.00    Additional pack years: 0.00    Total pack years: 1.00    Types: Cigars, Cigarettes    Quit date: 08/09/2014    Years since quitting: 7.7   Smokeless tobacco: Never  Vaping Use   Vaping Use: Never used  Substance and Sexual Activity   Alcohol use: No    Alcohol/week: 0.0 standard drinks of alcohol   Drug use: No   Sexual activity: Not Currently    Partners: Male    Comment: pt. declined condoms  Other Topics Concern   Not on file  Social History Narrative   Epworth Sleepiness Scale = 7 (as of 03/16/2015)   Social Determinants of Health   Financial Resource Strain: Medium Risk (02/20/2021)   Overall Financial Resource Strain (CARDIA)    Difficulty of Paying Living Expenses: Somewhat hard  Food Insecurity: No Food Insecurity (04/22/2022)   Hunger Vital Sign    Worried About Running Out of Food in the Last Year: Never true    Ran Out of Food in the Last Year: Never true  Transportation Needs: No Transportation Needs (04/22/2022)   PRAPARE - Hydrologist (Medical): No    Lack of Transportation (Non-Medical): No  Physical Activity: Inactive (10/01/2017)   Exercise Vital Sign    Days of Exercise per Week: 0 days    Minutes of Exercise per Session: 0 min  Stress: No Stress Concern Present (10/01/2017)   [place]    Feeling of Stress : Only a little  Social Connections: Moderately Isolated (10/01/2017)   Social Connection and Isolation Panel [NHANES]    Frequency of Communication with Friends and Family: More than three times a week    Frequency of Social Gatherings with Friends and Family: Once a week    Attends Religious Services: Never    Marine scientist or Organizations: No    Attends Music therapist: Not  asked    Marital Status: Never married  Intimate Partner Violence: Not At Risk (10/01/2017)   Humiliation, Afraid, Rape, and Kick questionnaire    Fear of Current or Ex-Partner: No    Emotionally Abused: No    Physically Abused: No    Sexually Abused: No   Current  Outpatient Medications on File Prior to Visit  Medication Sig Dispense Refill   acetaminophen (TYLENOL) 500 MG tablet Take 1,500 mg by mouth in the morning and at bedtime.     ALPRAZolam (XANAX) 1 MG tablet Take 1 mg by mouth at bedtime. *May take one tablet up to 4 times daily as needed for anxiety     amphetamine-dextroamphetamine (ADDERALL) 30 MG tablet Take 30 mg by mouth 3 (three) times daily.     Brexpiprazole (REXULTI) 0.5 MG TABS Take by mouth.     brexpiprazole (REXULTI) 1 MG TABS tablet Take 1 mg by mouth at bedtime.     Continuous Blood Gluc Receiver (FREESTYLE LIBRE 2 READER) DEVI Use as instructed to check blood sugars. 1 each 2   dapagliflozin propanediol (FARXIGA) 10 MG TABS tablet Take 1 tablet (10 mg total) by mouth daily before breakfast. 90 tablet 3   Dextromethorphan-buPROPion ER (AUVELITY) 45-105 MG TBCR Take 1 tablet by mouth daily at 12 noon.     diclofenac Sodium (VOLTAREN) 1 % GEL APPLY 2 GM TO EACH KNEE EVERY MORNING AND EVERY NIGHT AT BEDTIME, AND APPLY 1 GM TO EACH KNEE EVERY AFTERNOON 300 g 2   divalproex (DEPAKOTE ER) 500 MG 24 hr tablet TAKE 1 TABLET BY MOUTH AT BEDTIME 90 tablet 0   Dulaglutide (TRULICITY) 4.5 0000000 SOPN Inject 4.5 mg as directed once a week. 6 mL 3   Evolocumab (REPATHA SURECLICK) XX123456 MG/ML SOAJ Inject 140 mg into the skin every 14 (fourteen) days. 2 mL 11   ezetimibe (ZETIA) 10 MG tablet TAKE 1 TABLET(10 MG) BY MOUTH DAILY 90 tablet 2   finasteride (PROSCAR) 5 MG tablet Take 5 mg by mouth daily.     furosemide (LASIX) 40 MG tablet TAKE 1 TABLET(40 MG) BY MOUTH DAILY 90 tablet 3   insulin aspart (NOVOLOG FLEXPEN) 100 UNIT/ML FlexPen 15 units with breakfast and 25 units with  supper. 45 mL 3   insulin glargine (LANTUS SOLOSTAR) 100 UNIT/ML Solostar Pen Inject 80-90 Units into the skin every morning. 105 mL 3   Insulin Pen Needle (B-D ULTRAFINE III SHORT PEN) 31G X 8 MM MISC USE AS DIRECTED THREE TIMES DAILY 100 each 2   Lemborexant (DAYVIGO) 10 MG TABS Take 10 mg by mouth at bedtime.     levothyroxine (SYNTHROID, LEVOTHROID) 50 MCG tablet Take 1 tablet (50 mcg total) by mouth at bedtime. 30 tablet 11   ondansetron (ZOFRAN) 8 MG tablet Take 1 tablet (8 mg total) by mouth every 8 (eight) hours as needed. 20 tablet 1   pantoprazole (PROTONIX) 40 MG tablet TAKE 1 TABLET(40 MG) BY MOUTH DAILY 90 tablet 1   pregabalin (LYRICA) 300 MG capsule TAKE 1 CAPSULE(300 MG) BY MOUTH TWICE DAILY 60 capsule 5   protriptyline (VIVACTIL) 10 MG tablet Take 10 mg by mouth 3 (three) times daily.   11   silodosin (RAPAFLO) 8 MG CAPS capsule Take 8 mg by mouth daily.     TRIUMEQ 600-50-300 MG tablet TAKE 1 TABLET BY MOUTH DAILY 30 tablet 6   XARELTO 20 MG TABS tablet TAKE 1 TABLET(20 MG) BY MOUTH DAILY WITH SUPPER 90 tablet 2   No current facility-administered medications on file prior to visit.   Allergies  Allergen Reactions   Aspirin Swelling   Efavirenz Swelling and Rash    Other reaction(s): anaphylaxis   Ibuprofen Swelling   Nsaids Other (See Comments)    unknwn   Pravastatin     myalgias  Lipitor [Atorvastatin Calcium] Other (See Comments)    Leg pain   Family History  Problem Relation Age of Onset   Depression Brother    Throat cancer Brother        half brother, never smoker   COPD Mother    Diabetes Neg Hx    PE: BP 128/82 (BP Location: Right Arm, Patient Position: Sitting, Cuff Size: Normal)   Pulse 75   Ht '5\' 10"'$  (1.778 m)   Wt 247 lb 12.8 oz (112.4 kg)   SpO2 97%   BMI 35.56 kg/m  Wt Readings from Last 3 Encounters:  05/08/22 247 lb 12.8 oz (112.4 kg)  04/29/22 249 lb 6.4 oz (113.1 kg)  04/14/22 250 lb (113.4 kg)   Constitutional: overweight, in  NAD Eyes: no exophthalmos ENT:no thyromegaly, no cervical lymphadenopathy Cardiovascular: RRR, No MRG, + mild LE edema B (R>L) Respiratory: CTA B Musculoskeletal: no deformities except right transmetatarsal amputation Skin: no rashes Neurological: +tremor with outstretched hands  ASSESSMENT: 1. DM2, insulin-dependent, uncontrolled, with complications - PAD - foot ulcer, status post right transmetatarsal amputation - stage IIIa CKD - PN  2.  Hyperlipidemia  3.  Acquired hypothyroidism  PLAN:  1. Patient with longstanding fairly well-controlled type 2 diabetes, on oral antidiabetic regimen with SGLT2 inhibitor and also weekly GLP-1 receptor agonist and basal-bolus insulin regimen, with improving control.  At last visit, HbA1c was lower, at 6.4%.  At that time, I advised him to reduce his Lantus dose and increase his NovoLog doses slightly due to occasional low blood sugars in the 50s, approximately twice a week .  I also strongly advised him to stop drinking milk. -At today's visit, he tells me that he is reducing milk and drinking more water.  He tried the 1% milk but he did not like it. CGM interpretation: -At today's visit, we reviewed his CGM downloads: It appears that 73% of values are in target range (goal >70%), while 37% are higher than 180 (goal <25%), and 0% are lower than 70 (goal <4%).  The calculated average blood sugar is 152.  The projected HbA1c for the next 3 months ([facility]MI) is 6.9%. -Reviewing the CGM trends, sugars appear to be fluctuating mostly within the target range, but with higher numbers occasionally after dinner and more frequently during the night, when he stays up late and eats.  We discussed that ideally he would not eat in the middle of the night, but if he does, he will need to cover this snacks/meals with insulin.  He is occasionally eating oatmeal and he tells me that he has a lot of brown sugar.  I discussed that ideally he would not need this during the night,  but if it does, he does not need to take insulin for it. -He tells me that his Trulicity is not covered by his insurance and the new year.  I gave him a list of GLP-1 receptor agonist which we could use but also try to send a prescription for Ozempic to his pharmacy.  Will get a try to start at a lower dose and increase as tolerated. -I advised him to: Patient Instructions  Please continue: - Farxiga 10 mg before b'fast - Trulicity 4.5 mg weekl - Lantus 80 units  in a.m. - Novolog 15 minutes before meals: 15-20 units in a.m. and 25-30 units before dinner  Try to stop eating at night, but if you have a snack, you may need to cover it with Novolog 5-8 units.  Instead of Trulicity, we can use: - Ozempic - Mounjaro - Victoza (once a day)  We can try to change to Ozempic 0.5 mg weekly x 1 mo - let me know if we can increase the dose afterwards.  Please return in 3-4 months.  - we checked his HbA1c: 6.6% (slightly higher) - advised to check sugars at different times of the day - 3-4x a day, rotating check times - advised for yearly eye exams >> he is UTD - return to clinic in 3-4 months  2. HL and 4.  Drug (statin)-induced myopathy -Reviewed latest lipid panel from 03/2022: LDL very low, triglycerides elevated: Lab Results  Component Value Date   CHOL 96 04/14/2022   HDL 47 04/14/2022   LDLCALC 11 04/14/2022   TRIG 359 (H) 04/14/2022   CHOLHDL 2.0 04/14/2022  -He continues on Zetia and Repatha without side effects.  He could not tolerate statins due to myalgias.  3.  Thyroid hormone use -He was started on thyroid hormones by his psychiatrist, without a diagnosis of hypothyroidism, apparently for symptom control. -Latest TSH was reviewed with the patient and this was normal: Lab Results  Component Value Date   TSH 0.47 05/07/2020  -He continues on levothyroxine 50 mcg daily. -Further management per psychiatry.  Philemon Kingdom, MD PhD [facility] Endocrinology

## 2022-05-08 NOTE — Patient Instructions (Addendum)
Please continue: - Farxiga 10 mg before b'fast - Trulicity 4.5 mg weekl - Lantus 80 units  in a.m. - Novolog 15 minutes before meals: 15-20 units in a.m. and 25-30 units before dinner  Try to stop eating at night, but if you have a snack, you may need to cover it with Novolog 5-8 units.  Instead of Trulicity, we can use: - Ozempic - Mounjaro - Victoza (once a day)  We can try to change to Ozempic 0.5 mg weekly x 1 mo - let me know if we can increase the dose afterwards.  Please return in 3-4 months.

## 2022-05-11 ENCOUNTER — Other Ambulatory Visit: Payer: Self-pay | Admitting: Internal Medicine

## 2022-05-11 DIAGNOSIS — B20 Human immunodeficiency virus [HIV] disease: Secondary | ICD-10-CM

## 2022-05-13 ENCOUNTER — Other Ambulatory Visit (HOSPITAL_COMMUNITY): Payer: Self-pay

## 2022-05-14 ENCOUNTER — Encounter: Payer: Self-pay | Admitting: Adult Health

## 2022-05-14 ENCOUNTER — Ambulatory Visit: Payer: PPO | Admitting: Adult Health

## 2022-05-14 VITALS — BP 148/80 | HR 95 | Ht 70.0 in | Wt 250.0 lb

## 2022-05-14 DIAGNOSIS — G4733 Obstructive sleep apnea (adult) (pediatric): Secondary | ICD-10-CM

## 2022-05-14 NOTE — Progress Notes (Signed)
PATIENT: Daniel [name] DOB: [birth year]  REASON FOR VISIT: follow up HISTORY FROM: patient PRIMARY NEUROLOGIST: Dr. Brett Fairy  Chief Complaint  Patient presents with   Follow-up    Pt alone rm 5. Here for yearly follow up with CPAP. States the machine he started in feb 2023 is no longer working. He reached out to the insurance and they stated they would cover a new machine at 80% and he may get a new machine. Pt has been using the old machine since 05/03/22. His new machine stopped working 05/02/22. DME [facility]     HISTORY OF PRESENT ILLNESS: Today 05/14/22:  Daniel [name] is a 67 y.o. male with a history of obstructive sleep apnea on CPAP. Returns today for follow-up.  Patient reports that he got a new machine last year but unfortunately it has broken.  He has been using an old machine.  The machine is working well but it does to say end of motor life.  The patient would like to get a new machine if insurance will cover.  His download is below.  This download is from his old machine.       HISTORY 08/29/20:   Daniel [name] is a 67 year old male with a history of obstructive sleep apnea on CPAP.  He returns today for follow-up.  He states that he has not been using his CPAP for the last month.  He reports that he is in the process of moving and just has not been using it.  He does state that when he was using it he felt that he was suffocating.  He feels that the initial pressure needs to be higher.  Below is his download from a month ago.       REVIEW OF SYSTEMS: Out of a complete 14 system review of symptoms, the patient complains only of the following symptoms, and all other reviewed systems are negative.  FSS ESS  ALLERGIES: Allergies  Allergen Reactions   Aspirin Swelling   Efavirenz Swelling and Rash    Other reaction(s): anaphylaxis   Ibuprofen Swelling   Nsaids Other (See Comments)    unknwn   Pravastatin     myalgias   Lipitor [Atorvastatin Calcium] Other (See  Comments)    Leg pain    HOME MEDICATIONS: Outpatient Medications Prior to Visit  Medication Sig Dispense Refill   acetaminophen (TYLENOL) 500 MG tablet Take 1,500 mg by mouth in the morning and at bedtime.     ALPRAZolam (XANAX) 1 MG tablet Take 1 mg by mouth at bedtime. *May take one tablet up to 4 times daily as needed for anxiety     amphetamine-dextroamphetamine (ADDERALL) 30 MG tablet Take 30 mg by mouth 3 (three) times daily.     Brexpiprazole (REXULTI) 0.5 MG TABS Take by mouth.     brexpiprazole (REXULTI) 1 MG TABS tablet Take 1 mg by mouth at bedtime.     Continuous Blood Gluc Receiver (FREESTYLE LIBRE 2 READER) DEVI Use as instructed to check blood sugars. 1 each 2   dapagliflozin propanediol (FARXIGA) 10 MG TABS tablet Take 1 tablet (10 mg total) by mouth daily before breakfast. 90 tablet 3   Dextromethorphan-buPROPion ER (AUVELITY) 45-105 MG TBCR Take 1 tablet by mouth daily at 12 noon.     diclofenac Sodium (VOLTAREN) 1 % GEL APPLY 2 GM TO EACH KNEE EVERY MORNING AND EVERY NIGHT AT BEDTIME, AND APPLY 1 GM TO EACH KNEE EVERY AFTERNOON 300 g 2   divalproex (  DEPAKOTE ER) 500 MG 24 hr tablet TAKE 1 TABLET BY MOUTH AT BEDTIME 90 tablet 0   Dulaglutide (TRULICITY) 4.5 0000000 SOPN Inject 4.5 mg as directed once a week. 6 mL 3   Evolocumab (REPATHA SURECLICK) [id number] MG/ML SOAJ Inject 140 mg into the skin every 14 (fourteen) days. 2 mL 11   ezetimibe (ZETIA) 10 MG tablet TAKE 1 TABLET(10 MG) BY MOUTH DAILY 90 tablet 2   finasteride (PROSCAR) 5 MG tablet Take 5 mg by mouth daily.     furosemide (LASIX) 40 MG tablet TAKE 1 TABLET(40 MG) BY MOUTH DAILY 90 tablet 3   insulin aspart (NOVOLOG FLEXPEN) 100 UNIT/ML FlexPen 15 units with breakfast and 25 units with supper. 45 mL 3   insulin glargine (LANTUS SOLOSTAR) 100 UNIT/ML Solostar Pen Inject 80-90 Units into the skin every morning. 105 mL 3   Insulin Pen Needle (B-D ULTRAFINE III SHORT PEN) 31G X 8 MM MISC USE AS DIRECTED THREE TIMES DAILY  100 each 2   Lemborexant (DAYVIGO) 10 MG TABS Take 10 mg by mouth at bedtime.     levothyroxine (SYNTHROID, LEVOTHROID) 50 MCG tablet Take 1 tablet (50 mcg total) by mouth at bedtime. 30 tablet 11   ondansetron (ZOFRAN) 8 MG tablet Take 1 tablet (8 mg total) by mouth every 8 (eight) hours as needed. 20 tablet 1   pantoprazole (PROTONIX) 40 MG tablet TAKE 1 TABLET(40 MG) BY MOUTH DAILY 90 tablet 1   pregabalin (LYRICA) 300 MG capsule TAKE 1 CAPSULE(300 MG) BY MOUTH TWICE DAILY 60 capsule 5   protriptyline (VIVACTIL) 10 MG tablet Take 10 mg by mouth 3 (three) times daily.   11   Semaglutide,0.25 or 0.5MG /DOS, 2 MG/3ML SOPN Inject 0.5 mg weekly under skin 3 mL 3   silodosin (RAPAFLO) 8 MG CAPS capsule Take 8 mg by mouth daily.     TRIUMEQ 600-50-300 MG tablet TAKE 1 TABLET BY MOUTH DAILY 30 tablet 6   XARELTO 20 MG TABS tablet TAKE 1 TABLET(20 MG) BY MOUTH DAILY WITH SUPPER 90 tablet 2   No facility-administered medications prior to visit.    PAST MEDICAL HISTORY: Past Medical History:  Diagnosis Date   ADHD (attention deficit hyperactivity disorder)    Anxiety    Chronic kidney disease    Clotting disorder ([place])    Depression    Diabetes mellitus without complication ([place])    Diabetes mellitus, type II ([place])    Dizziness 03/17/2015   Essential hypertension 06/25/2018   GERD (gastroesophageal reflux disease)    HIV disease ([name])    HIV infection ([name])    Liver disease    OSA (obstructive sleep apnea) 07/25/2015   Uses CPAP regularly   Peripheral vascular disease ([place])    Ulcer     PAST SURGICAL HISTORY: Past Surgical History:  Procedure Laterality Date   AMPUTATION Right 10/02/2017   Procedure: RIGHT TRANSMETATARSAL AMPUTATION;  Surgeon: Leandrew Koyanagi, MD;  Location: [place];  Service: Orthopedics;  Laterality: Right;   SMALL INTESTINE SURGERY     STOMACH SURGERY     TOE AMPUTATION Right 08/2016   right great toe    FAMILY HISTORY: Family History  Problem Relation Age of Onset    Depression Brother    Throat cancer Brother        half brother, never smoker   COPD Mother    Diabetes Neg Hx     SOCIAL HISTORY: Social History   Socioeconomic History   Marital status: Single  Spouse name: Not on file   Number of children: Not on file   Years of education: Not on file   Highest education level: Not on file  Occupational History    Comment: DISABILITY  Tobacco Use   Smoking status: Former    Packs/day: 0.10    Years: 10.00    Additional pack years: 0.00    Total pack years: 1.00    Types: Cigars, Cigarettes    Quit date: 08/09/2014    Years since quitting: 7.7   Smokeless tobacco: Never  Vaping Use   Vaping Use: Never used  Substance and Sexual Activity   Alcohol use: No    Alcohol/week: 0.0 standard drinks of alcohol   Drug use: No   Sexual activity: Not Currently    Partners: Male    Comment: pt. declined condoms  Other Topics Concern   Not on file  Social History Narrative   Epworth Sleepiness Scale = 7 (as of 03/16/2015)   Social Determinants of Health   Financial Resource Strain: Medium Risk (02/20/2021)   Overall Financial Resource Strain (CARDIA)    Difficulty of Paying Living Expenses: Somewhat hard  Food Insecurity: No Food Insecurity (04/22/2022)   Hunger Vital Sign    Worried About Running Out of Food in the Last Year: Never true    Ran Out of Food in the Last Year: Never true  Transportation Needs: No Transportation Needs (04/22/2022)   PRAPARE - Hydrologist (Medical): No    Lack of Transportation (Non-Medical): No  Physical Activity: Inactive (10/01/2017)   Exercise Vital Sign    Days of Exercise per Week: 0 days    Minutes of Exercise per Session: 0 min  Stress: No Stress Concern Present (10/01/2017)   [place]    Feeling of Stress : Only a little  Social Connections: Moderately Isolated (10/01/2017)   Social Connection and  Isolation Panel [NHANES]    Frequency of Communication with Friends and Family: More than three times a week    Frequency of Social Gatherings with Friends and Family: Once a week    Attends Religious Services: Never    Marine scientist or Organizations: No    Attends Music therapist: Not asked    Marital Status: Never married  Intimate Partner Violence: Not At Risk (10/01/2017)   Humiliation, Afraid, Rape, and Kick questionnaire    Fear of Current or Ex-Partner: No    Emotionally Abused: No    Physically Abused: No    Sexually Abused: No      PHYSICAL EXAM  Vitals:   05/14/22 1151  BP: (!) 148/80  Pulse: 95  Weight: 250 lb (113.4 kg)  Height: 5\' 10"  (1.778 m)   Body mass index is 35.87 kg/m.  Generalized: Well developed, in no acute distress  Chest: Lungs clear to auscultation bilaterally  Neurological examination  Mentation: Alert oriented to time, place, history taking. Follows all commands speech and language fluent Cranial nerve II-XII: Facial symmetry noted Gait and station: Gait is normal.    DIAGNOSTIC DATA (LABS, IMAGING, TESTING) - I reviewed patient records, labs, notes, testing and imaging myself where available.  Lab Results  Component Value Date   WBC 7.0 04/14/2022   HGB 17.7 (H) 04/14/2022   HCT 50.0 04/14/2022   MCV 94.2 04/14/2022   PLT 146 04/14/2022      Component Value Date/Time   NA 141 04/14/2022 1529  NA 144 10/08/2021 1443   K 3.7 04/14/2022 1529   CL 104 04/14/2022 1529   CO2 24 04/14/2022 1529   GLUCOSE 194 (H) 04/14/2022 1529   BUN 23 04/14/2022 1529   BUN 21 10/08/2021 1443   CREATININE 1.53 (H) 04/14/2022 1529   CALCIUM 9.5 04/14/2022 1529   PROT 7.2 04/14/2022 1529   PROT 7.6 10/08/2021 1443   ALBUMIN 4.7 10/08/2021 1443   AST 19 04/14/2022 1529   ALT 28 04/14/2022 1529   ALKPHOS 124 (H) 10/08/2021 1443   BILITOT 0.7 04/14/2022 1529   BILITOT 0.8 10/08/2021 1443   GFRNONAA 46 (L) 01/04/2020 1546    GFRNONAA 41 (L) 09/13/2019 1714   GFRAA 53 (L) 01/04/2020 1546   GFRAA 48 (L) 09/13/2019 1714   Lab Results  Component Value Date   CHOL 96 04/14/2022   HDL 47 04/14/2022   LDLCALC 11 04/14/2022   TRIG 359 (H) 04/14/2022   CHOLHDL 2.0 04/14/2022   Lab Results  Component Value Date   HGBA1C 6.6 (A) 05/08/2022   Lab Results  Component Value Date   VITAMINB12 424 09/15/2019   Lab Results  Component Value Date   TSH 0.47 05/07/2020      ASSESSMENT AND PLAN 67 y.o. year old male  has a past medical history of ADHD (attention deficit hyperactivity disorder), Anxiety, Chronic kidney disease, Clotting disorder ([place]), Depression, Diabetes mellitus without complication ([place]), Diabetes mellitus, type II ([place]), Dizziness (03/17/2015), Essential hypertension (06/25/2018), GERD (gastroesophageal reflux disease), HIV disease ([place]), HIV infection ([place]), Liver disease, OSA (obstructive sleep apnea) (07/25/2015), Peripheral vascular disease ([place]), and Ulcer. here with:  OSA on CPAP  -We will reach out to his DME company to see if he can get a new machine or if that 1 can be fixed. - Good treatment of AHI  - Encourage patient to use CPAP nightly and > 4 hours each night - F/U in 1 year or sooner if needed   .  Ward Givens, MSN, NP-C 05/14/2022, 11:50 AM [name] [address] [place], [place] [address] [phone]

## 2022-05-15 ENCOUNTER — Other Ambulatory Visit (HOSPITAL_COMMUNITY): Payer: Self-pay

## 2022-05-15 NOTE — Telephone Encounter (Signed)
Placed a call to follow up on the prior authorization for the Ondansetron. Per the representative at [facility], there was not a record of the request.   Faxed prior authorization from to the insurance and they did receive it and stated it can take up to 72 hours for determination.   [phone] phone [phone] fax

## 2022-05-20 ENCOUNTER — Telehealth: Payer: Self-pay

## 2022-05-20 ENCOUNTER — Other Ambulatory Visit (HOSPITAL_COMMUNITY): Payer: Self-pay

## 2022-05-20 DIAGNOSIS — Z8669 Personal history of other diseases of the nervous system and sense organs: Secondary | ICD-10-CM

## 2022-05-20 NOTE — Telephone Encounter (Signed)
PA request received via fax from patients pharmacy for Ozempic (0.25 or 0.5 MG/DOSE) 2MG /3ML pen-injectors  PA submitted to [employer] via [place] and is pending determination.  Key: [id number]

## 2022-05-20 NOTE — Telephone Encounter (Addendum)
Received a fax regarding Prior Authorization from [place] for Ondansetron 8mg  tabs.  Authorization has been DENIED because it is being used for an indication which is not approved or medically accepted: GERD and human immunodeficiency virus. The requested drug must meet the following therapeutic criteria: diagnosis of chemotherapy or radiation induced nausea/vomiting or prophylaxis of post-surgical nausea/vomiting, before induction of anesthesia or shortly after surgery.  Phone# [phone]  Denial letter attached to chart  Please be advised we currently do not have a pharmacist to review denials, therefore you will need to process appeals accordingly as needed. Thanks for your support at this time.

## 2022-05-20 NOTE — Telephone Encounter (Signed)
Pt states he is ok with Zofran 4 mg so insurance will cover it .THEY will not cover the 8 mg. I am not sure whey they added there note for the Ozempic on my phone message their is a note on this matter from [place] from yesterday

## 2022-05-20 NOTE — Telephone Encounter (Signed)
Called and left pt a Vm asking him to call the office and see if he is ok with changing it to the 4 mg

## 2022-05-20 NOTE — Telephone Encounter (Signed)
Should we proceed and obtain an appeal?

## 2022-05-20 NOTE — Telephone Encounter (Signed)
Pt is agreeable to the 4 mg tablet

## 2022-05-20 NOTE — Telephone Encounter (Signed)
Zofran has been prescribed previously by neurology for nausea with prolonged severe headaches.  Looking back, I think we have sent an appeal previously.  Not sure if this additional information will help.  Another option would be 4 mg pills 1-2 as needed if that would be covered instead of the 8 mg.  Let me know if I can help further.

## 2022-05-20 NOTE — Telephone Encounter (Signed)
Pt called and said prescribing him a 'pill' is just fine with him.

## 2022-05-21 ENCOUNTER — Other Ambulatory Visit (HOSPITAL_COMMUNITY): Payer: Self-pay

## 2022-05-21 MED ORDER — ONDANSETRON HCL 4 MG PO TABS: 4.0000 mg | ORAL_TABLET | Freq: Three times a day (TID) | ORAL | 1 refills | Status: DC | PRN

## 2022-05-21 NOTE — Telephone Encounter (Signed)
Zofran 4 mg ordered, 1-2 every 8 hours.

## 2022-05-22 ENCOUNTER — Other Ambulatory Visit (HOSPITAL_COMMUNITY): Payer: Self-pay

## 2022-05-22 DIAGNOSIS — N179 Acute kidney failure, unspecified: Secondary | ICD-10-CM | POA: Diagnosis not present

## 2022-05-22 DIAGNOSIS — Z6836 Body mass index (BMI) 36.0-36.9, adult: Secondary | ICD-10-CM | POA: Diagnosis not present

## 2022-05-22 DIAGNOSIS — E876 Hypokalemia: Secondary | ICD-10-CM | POA: Diagnosis not present

## 2022-05-22 DIAGNOSIS — I10 Essential (primary) hypertension: Secondary | ICD-10-CM | POA: Diagnosis not present

## 2022-05-22 DIAGNOSIS — R55 Syncope and collapse: Secondary | ICD-10-CM | POA: Diagnosis not present

## 2022-05-22 DIAGNOSIS — Z794 Long term (current) use of insulin: Secondary | ICD-10-CM | POA: Diagnosis not present

## 2022-05-22 DIAGNOSIS — E0849 Diabetes mellitus due to underlying condition with other diabetic neurological complication: Secondary | ICD-10-CM | POA: Diagnosis not present

## 2022-05-22 DIAGNOSIS — R399 Unspecified symptoms and signs involving the genitourinary system: Secondary | ICD-10-CM | POA: Diagnosis not present

## 2022-05-22 DIAGNOSIS — B20 Human immunodeficiency virus [HIV] disease: Secondary | ICD-10-CM | POA: Diagnosis not present

## 2022-05-26 ENCOUNTER — Other Ambulatory Visit (HOSPITAL_COMMUNITY): Payer: Self-pay

## 2022-05-26 ENCOUNTER — Ambulatory Visit: Payer: PPO | Admitting: Adult Health

## 2022-05-26 ENCOUNTER — Ambulatory Visit: Payer: PPO | Admitting: Family Medicine

## 2022-05-28 ENCOUNTER — Other Ambulatory Visit: Payer: Self-pay | Admitting: Internal Medicine

## 2022-05-28 ENCOUNTER — Other Ambulatory Visit: Payer: Self-pay | Admitting: Family Medicine

## 2022-05-28 DIAGNOSIS — K219 Gastro-esophageal reflux disease without esophagitis: Secondary | ICD-10-CM

## 2022-05-28 DIAGNOSIS — Z794 Long term (current) use of insulin: Secondary | ICD-10-CM

## 2022-05-28 DIAGNOSIS — E0849 Diabetes mellitus due to underlying condition with other diabetic neurological complication: Secondary | ICD-10-CM

## 2022-05-29 ENCOUNTER — Other Ambulatory Visit (HOSPITAL_COMMUNITY): Payer: Self-pay

## 2022-05-29 DIAGNOSIS — H52203 Unspecified astigmatism, bilateral: Secondary | ICD-10-CM | POA: Diagnosis not present

## 2022-05-29 DIAGNOSIS — Z961 Presence of intraocular lens: Secondary | ICD-10-CM | POA: Diagnosis not present

## 2022-05-29 DIAGNOSIS — H524 Presbyopia: Secondary | ICD-10-CM | POA: Diagnosis not present

## 2022-05-29 DIAGNOSIS — E119 Type 2 diabetes mellitus without complications: Secondary | ICD-10-CM | POA: Diagnosis not present

## 2022-05-29 DIAGNOSIS — Z794 Long term (current) use of insulin: Secondary | ICD-10-CM | POA: Diagnosis not present

## 2022-05-29 LAB — HM DIABETES EYE EXAM

## 2022-05-29 NOTE — Addendum Note (Signed)
Addended by: Patrcia Dolly on: 05/29/2022 04:30 PM   Modules accepted: Orders

## 2022-05-29 NOTE — Telephone Encounter (Signed)
Patient called get his medication Zofran 4 mg sent to  [place] [place]

## 2022-05-30 ENCOUNTER — Telehealth: Payer: Self-pay | Admitting: Family Medicine

## 2022-05-30 ENCOUNTER — Telehealth: Payer: Self-pay

## 2022-05-30 DIAGNOSIS — E1122 Type 2 diabetes mellitus with diabetic chronic kidney disease: Secondary | ICD-10-CM

## 2022-05-30 MED ORDER — INSULIN LISPRO (1 UNIT DIAL) 100 UNIT/ML (KWIKPEN): PEN_INJECTOR | SUBCUTANEOUS | 3 refills | Status: DC

## 2022-05-30 NOTE — Telephone Encounter (Signed)
Called patient to schedule Medicare Annual Wellness Visit (AWV). Unable to reach patient.  Call can't be completed.  Last date of AWV: 04/21/2019    Please schedule an AWVS appointment at any time with [facility] SV ANNUAL WELLNESS VISIT.  If any questions, please contact me at [phone].    Thank you,  [facility] Support [facility] Direct dial  [phone]

## 2022-05-30 NOTE — Telephone Encounter (Signed)
Pt called to advise insurance prefers Humalog lispro insulin. New rx sent to preferred pharmacy.

## 2022-06-02 ENCOUNTER — Ambulatory Visit: Payer: PPO | Admitting: Adult Health

## 2022-06-03 ENCOUNTER — Telehealth: Payer: Self-pay | Admitting: Pharmacy Technician

## 2022-06-03 ENCOUNTER — Other Ambulatory Visit (HOSPITAL_COMMUNITY): Payer: Self-pay

## 2022-06-03 NOTE — Telephone Encounter (Signed)
Apologies for the delay on this. We never got a determination and as I was working on the [employer] (that didn't go through Cover My Meds for whatever reason) when I realized that we had requested this one. I called [employer] to find out it was approved just a couple days after we requested it and see the pt has still been taking it. Do you still want to see about getting the Ozempic approved and switch him, or was that only because he thought this Trulicity wasn't going to be covered this year?

## 2022-06-03 NOTE — Telephone Encounter (Signed)
I am not quite sure what the question is, but he can continue with the Trulicity 4.5 mg daily if he was able to obtain it.

## 2022-06-03 NOTE — Telephone Encounter (Signed)
Pharmacy Patient Advocate Encounter  Prior Authorization for Trulicity has been approved by [employer] (ins).    PA # Request ID [id number] Effective dates: 04/11/22 through 2/14 /25

## 2022-06-03 NOTE — Telephone Encounter (Signed)
Pt can stay on current mediation Trulicity 4.5 mg dose.

## 2022-06-04 ENCOUNTER — Telehealth: Payer: Self-pay | Admitting: Nutrition

## 2022-06-04 NOTE — Telephone Encounter (Signed)
I want to cancel my appointment with you.  I have not been out of my bed for days and  can not seem to do anything.  Have not done anything you have asked me to do.  I got out of bed yesterday at 10PM and could not sleep until 5 this morning Suggested he walk outside and sit on his porch.  He agreed to do this. I made him close his eyes and tell me what he hears.  He says he hears birds chirping and rain falling.   I suggested he do this 3 more times today for me and call me Monday.  He agreed to do this

## 2022-06-04 NOTE — Telephone Encounter (Signed)
Yes, he knows this.  When I talk with him on Monday, I will hopefully get him in to see me to learn more.

## 2022-06-06 ENCOUNTER — Other Ambulatory Visit: Payer: Self-pay | Admitting: Family Medicine

## 2022-06-06 DIAGNOSIS — M792 Neuralgia and neuritis, unspecified: Secondary | ICD-10-CM

## 2022-06-06 DIAGNOSIS — Z8669 Personal history of other diseases of the nervous system and sense organs: Secondary | ICD-10-CM

## 2022-06-06 DIAGNOSIS — E1142 Type 2 diabetes mellitus with diabetic polyneuropathy: Secondary | ICD-10-CM

## 2022-06-06 NOTE — Telephone Encounter (Signed)
Lyrica 300 mg  LOV: 12/19/21 Last Refill:12/19/21 Upcoming appt: 06/18/22

## 2022-06-06 NOTE — Telephone Encounter (Signed)
Controlled substance database (PDMP) reviewed. No concerns appreciated.  Last filled 05/09/2022, refills ordered.

## 2022-06-10 NOTE — Telephone Encounter (Signed)
LVM to call me to let me know how he is doing?

## 2022-06-11 ENCOUNTER — Other Ambulatory Visit: Payer: Self-pay | Admitting: Family Medicine

## 2022-06-11 DIAGNOSIS — E1142 Type 2 diabetes mellitus with diabetic polyneuropathy: Secondary | ICD-10-CM

## 2022-06-11 DIAGNOSIS — M792 Neuralgia and neuritis, unspecified: Secondary | ICD-10-CM

## 2022-06-12 ENCOUNTER — Telehealth: Payer: Self-pay | Admitting: Family Medicine

## 2022-06-12 ENCOUNTER — Ambulatory Visit: Payer: PPO | Admitting: Dietician

## 2022-06-12 DIAGNOSIS — G4733 Obstructive sleep apnea (adult) (pediatric): Secondary | ICD-10-CM | POA: Diagnosis not present

## 2022-06-12 NOTE — Telephone Encounter (Signed)
Called patient to schedule Medicare Annual Wellness Visit (AWV). Left message for patient to call back and schedule Medicare Annual Wellness Visit (AWV).  Last date of AWV: 04/21/2019   Please schedule an AWVS appointment at any time with [facility] SV ANNUAL WELLNESS VISIT.  If any questions, please contact me at [phone].    Thank you,  Katielynn Horan  Ambulatory Clinic Support [facility] Direct dial  [phone]   

## 2022-06-12 NOTE — Telephone Encounter (Signed)
Discussed peripheral neuropathy, Lyrica in October 2023.  Controlled substance database reviewed.  Pregabalin No. 60 filled 05/09/2022.  Refill ordered.

## 2022-06-12 NOTE — Telephone Encounter (Signed)
Lyrica 300 mg LOV: 12/19/21 Last Refill:06/06/22 Upcoming appt: 06/18/22

## 2022-06-17 ENCOUNTER — Telehealth: Payer: Self-pay | Admitting: Family Medicine

## 2022-06-17 ENCOUNTER — Ambulatory Visit: Payer: Self-pay

## 2022-06-17 NOTE — Patient Outreach (Signed)
  Care Coordination   06/17/2022 Name: [name] MRN: [id number] DOB: Apr 15, 1955   Care Coordination Outreach Attempts:  An unsuccessful telephone outreach was attempted today to offer the patient information about available care coordination services as a benefit of their health plan.   Follow Up Plan:  Additional outreach attempts will be made to offer the patient care coordination information and services.   Encounter Outcome:  No Answer   Care Coordination Interventions:  No, not indicated    Bary Leriche, RN, MSN [facility] Care Management Care Management Coordinator Direct Line [phone]

## 2022-06-17 NOTE — Telephone Encounter (Signed)
Called patient to schedule Medicare Annual Wellness Visit (AWV). Left message for patient to call back and schedule Medicare Annual Wellness Visit (AWV).  Last date of AWV: 04/21/2019   Please schedule an AWVS appointment at any time with [facility] SV ANNUAL WELLNESS VISIT.  If any questions, please contact me at [phone].    Thank you,  Chelsey Kimberley  Ambulatory Clinic Support [facility] Direct dial  [phone]   

## 2022-06-18 ENCOUNTER — Telehealth: Payer: Self-pay

## 2022-06-18 ENCOUNTER — Ambulatory Visit: Payer: PPO | Admitting: Family Medicine

## 2022-06-18 NOTE — Patient Outreach (Signed)
  Care Coordination   Follow Up Visit Note   06/18/2022 Name: Daniel [name] MRN: [id number] DOB: [birth year]  Daniel [name] is a 67 y.o. year old male who sees Shade Flood, MD for primary care. I spoke with  Emeterio Reeve by phone today.  What matters to the patients health and wellness today?  Maintain health    Goals Addressed             This Visit's Progress    Diabetes Management       Patient Goals/Self Care Activities: -Patient/Caregiver will self-administer medications as prescribed as evidenced by self-report/primary caregiver report  -Patient/Caregiver will attend all scheduled provider appointments as evidenced by clinician review of documented attendance to scheduled appointments and patient/caregiver report -Patient/Caregiver verbalizes understanding of plan  -check blood sugar at prescribed times -check blood sugar if I feel it is too high or too low -record values and write them down take them to all doctor visits  -schedule appointment with eye doctor  -Last blood sugar 119.  Reiterated diabetes management  Interventions Today    Flowsheet Row Most Recent Value  Chronic Disease   Chronic disease during today's visit Diabetes  General Interventions   General Interventions Discussed/Reviewed General Interventions Reviewed  Education Interventions   Education Provided Provided Education  Provided Verbal Education On Nutrition, Sick Day Rules  Nutrition Interventions   Nutrition Discussed/Reviewed Nutrition Discussed, Portion sizes, Decreasing sugar intake, Carbohydrate meal planning              SDOH assessments and interventions completed:  Yes  SDOH Interventions Today    Flowsheet Row Most Recent Value  SDOH Interventions   Housing Interventions Intervention Not Indicated  Utilities Interventions Intervention Not Indicated        Care Coordination Interventions:  Yes, provided   Follow up plan: Follow up call scheduled for June     Encounter Outcome:  Pt. Visit Completed   Bary Leriche, RN, MSN [facility] Care Management Care Management Coordinator Direct Line [phone]

## 2022-06-18 NOTE — Patient Instructions (Signed)
Visit Information  Thank you for taking time to visit with me today. Please don't hesitate to contact me if I can be of assistance to you.   Following are the goals we discussed today:   Goals Addressed             This Visit's Progress    Diabetes Management       Patient Goals/Self Care Activities: -Patient/Caregiver will self-administer medications as prescribed as evidenced by self-report/primary caregiver report  -Patient/Caregiver will attend all scheduled provider appointments as evidenced by clinician review of documented attendance to scheduled appointments and patient/caregiver report -Patient/Caregiver verbalizes understanding of plan  -check blood sugar at prescribed times -check blood sugar if I feel it is too high or too low -record values and write them down take them to all doctor visits  -schedule appointment with eye doctor  -Last blood sugar 119.  Reiterated diabetes management  Interventions Today    Flowsheet Row Most Recent Value  Chronic Disease   Chronic disease during today's visit Diabetes  General Interventions   General Interventions Discussed/Reviewed General Interventions Reviewed  Education Interventions   Education Provided Provided Education  Provided Verbal Education On Nutrition, Sick Day Rules  Nutrition Interventions   Nutrition Discussed/Reviewed Nutrition Discussed, Portion sizes, Decreasing sugar intake, Carbohydrate meal planning              Our next appointment is by telephone on 08/12/22 at 200 pm  Please call the care guide team at [phone] if you need to cancel or reschedule your appointment.   If you are experiencing a Mental Health or Behavioral Health Crisis or need someone to talk to, please call the Suicide and Crisis Lifeline: 988   Patient verbalizes understanding of instructions and care plan provided today and agrees to view in MyChart. Active MyChart status and patient understanding of how to access  instructions and care plan via MyChart confirmed with patient.     The patient has been provided with contact information for the care management team and has been advised to call with any health related questions or concerns.   Bary Leriche, RN, MSN [facility] Care Management Care Management Coordinator Direct Line [phone]

## 2022-06-18 NOTE — Telephone Encounter (Signed)
Called patient to schedule Medicare Annual Wellness Visit (AWV). Left message for patient to call back and schedule Medicare Annual Wellness Visit (AWV).  Last date of AWV: 04/21/2019   Please schedule an AWVS appointment at any time with [facility] SV ANNUAL WELLNESS VISIT.  If any questions, please contact me at [phone].    Thank you,  [facility] Support [facility] Direct dial  [phone]

## 2022-06-23 ENCOUNTER — Encounter: Payer: Self-pay | Admitting: Family Medicine

## 2022-06-23 ENCOUNTER — Telehealth: Payer: Self-pay | Admitting: Family Medicine

## 2022-06-23 ENCOUNTER — Ambulatory Visit (INDEPENDENT_AMBULATORY_CARE_PROVIDER_SITE_OTHER): Payer: PPO | Admitting: Family Medicine

## 2022-06-23 VITALS — BP 134/76 | HR 72 | Temp 98.2°F | Ht 70.0 in | Wt 254.2 lb

## 2022-06-23 DIAGNOSIS — M25561 Pain in right knee: Secondary | ICD-10-CM

## 2022-06-23 DIAGNOSIS — M25562 Pain in left knee: Secondary | ICD-10-CM | POA: Diagnosis not present

## 2022-06-23 DIAGNOSIS — Z7901 Long term (current) use of anticoagulants: Secondary | ICD-10-CM | POA: Diagnosis not present

## 2022-06-23 DIAGNOSIS — E782 Mixed hyperlipidemia: Secondary | ICD-10-CM

## 2022-06-23 DIAGNOSIS — E1122 Type 2 diabetes mellitus with diabetic chronic kidney disease: Secondary | ICD-10-CM

## 2022-06-23 DIAGNOSIS — I824Y9 Acute embolism and thrombosis of unspecified deep veins of unspecified proximal lower extremity: Secondary | ICD-10-CM

## 2022-06-23 DIAGNOSIS — Z794 Long term (current) use of insulin: Secondary | ICD-10-CM

## 2022-06-23 DIAGNOSIS — N183 Chronic kidney disease, stage 3 unspecified: Secondary | ICD-10-CM

## 2022-06-23 DIAGNOSIS — I10 Essential (primary) hypertension: Secondary | ICD-10-CM

## 2022-06-23 DIAGNOSIS — M792 Neuralgia and neuritis, unspecified: Secondary | ICD-10-CM

## 2022-06-23 DIAGNOSIS — E1151 Type 2 diabetes mellitus with diabetic peripheral angiopathy without gangrene: Secondary | ICD-10-CM | POA: Insufficient documentation

## 2022-06-23 DIAGNOSIS — G8929 Other chronic pain: Secondary | ICD-10-CM

## 2022-06-23 DIAGNOSIS — E1142 Type 2 diabetes mellitus with diabetic polyneuropathy: Secondary | ICD-10-CM | POA: Diagnosis not present

## 2022-06-23 DIAGNOSIS — K219 Gastro-esophageal reflux disease without esophagitis: Secondary | ICD-10-CM

## 2022-06-23 DIAGNOSIS — N1831 Chronic kidney disease, stage 3a: Secondary | ICD-10-CM

## 2022-06-23 DIAGNOSIS — Z8669 Personal history of other diseases of the nervous system and sense organs: Secondary | ICD-10-CM

## 2022-06-23 NOTE — Patient Instructions (Addendum)
You can use diclofenac gel to knees up to 4 times per day.  Continue lyrica same dose for now. Keep follow up with specialists.  No med changes for now.  Labs have been ordered recently by other specialist, no labs for me at this time.  Recheck in 6 months but I am happy to see you sooner if needed.   Take care.

## 2022-06-23 NOTE — Telephone Encounter (Signed)
Patient received denial for 4mg  and 8mg  zofran - can we check into this? my understanding is that 4mg  may be covered.

## 2022-06-23 NOTE — Progress Notes (Unsigned)
Subjective:  Patient ID: Daniel [name], male    DOB: November 12, 1955  Age: 67 y.o. MRN: [id number]  CC:  Chief Complaint  Patient presents with   Follow-up    Neck, knees and leg pain onset "awhile" Throbbing pain     HPI Othniel Maret [facility] presents for   Chronic medication follow-up.  Peripheral neuropathy Treated by endocrinology for diabetes.  Treated with farxiga, lantus, humalog. Peripheral  neuropathy with leg pains, chronic knee pain.  Treated with Lyrica 300 mg twice daily.  We discussed his history of falls and referred to PT, OT previously, use of walker recommended previously.  Last discussed in October.  Had reported a few falls without injuries, Lyrica was helping to control pain - sometimes more effective than others.  Lab Results  Component Value Date   HGBA1C 6.6 (A) 05/08/2022  Optho - saw 1 month ago, Nile Riggs.   Chronic knee pain Treated with Voltaren gel as of last visit in October. Still using 2 times per day - has been helpful to remain mobile. Uses assistive device when feeling unsteady. No recent falls or recent injuries with falls.   Hypertension: With chronic kidney disease, followed by nephrology, Dr. [name], stage III CKD.  ACE/ARB's have been avoided previously due to chronic renal failure with acute renal failure.  Creatinine around 1.5-1.7 baseline.Office visit in March with nephrology.  Was continuing on Farxiga and Lasix at that time.  Risk for acute on chronic failure but overall stable.  Recommended creatinine monitoring 2 times a year to head off other episodes of AKI, and avoiding ACE/ARB for that same reason.  Continued on 39-month visits with nephrology. On furosemide 40mg  qd, farxiga.  BP Readings from Last 3 Encounters:  06/23/22 134/76  05/14/22 (!) 148/80  05/08/22 128/82   Lab Results  Component Value Date   CREATININE 1.53 (H) 04/14/2022   Hyperlipidemia: Followed by cardiology, treated with Repatha. Lab Results  Component Value Date    CHOL 96 04/14/2022   HDL 47 04/14/2022   LDLCALC 11 04/14/2022   TRIG 359 (H) 04/14/2022   CHOLHDL 2.0 04/14/2022   Lab Results  Component Value Date   ALT 28 04/14/2022   AST 19 04/14/2022   ALKPHOS 124 (H) 10/08/2021   BILITOT 0.7 04/14/2022   HIV disease Treated with Triumeq, followed by infectious disease, Dr. [name],  undetectable HIV RNA on labs in February.  GERD: Has been stable with avoidance of trigger foods and daily use of Protonix. Taking daily.  Zofran has been used for nausea in past - neither has covered. He reccieved letter that zofran not covered. Nausea with his meds at times - upset stomach. Zofran has been helpful in past. Tried chewables form pharmacy that has been helpful.    History of DVT, chronic anticoagulation Treated with Xarelto 20mg  qd, no new bleeding, no new calf pain, chest pain or dyspnea. Some chronic leg swelling, no new pain.   Psychiatric: Followed by psychiatry, treated with Rexulti, Xanax, auvelity, amphetamine slats. Dayvigo, levothyroxine.  Dr. [name]. Also on depakote - initially for migraines - working well.   BPH with LUTS Followed by urology, Dr. [name], treated with finasteride, silodosin.     History Patient Active Problem List   Diagnosis Date Noted   Type 2 diabetes mellitus with diabetic peripheral angiopathy without gangrene ([name]) 06/23/2022   Routine screening for STI (sexually transmitted infection) 04/29/2022   Mixed hyperlipidemia 09/19/2021   Type II diabetes mellitus with peripheral circulatory disorder ([name])  09/19/2021   Insomnia due to mental condition 05/23/2021   Atherosclerosis of native artery of extremity ([name]) 04/25/2021   Chronic nonalcoholic liver disease 04/25/2021   History of hepatitis B 04/25/2021   Long term (current) use of insulin ([name]) 04/25/2021   Long term (current) use of anticoagulants 04/25/2021   Nonalcoholic steatohepatitis (NASH) 04/25/2021   Type 2 diabetes mellitus with other  circulatory complications ([name]) 04/25/2021   Type 2 diabetes mellitus with peripheral angiopathy ([name]) 04/25/2021   Abnormal liver function tests 04/25/2021   Drug-induced myopathy 02/12/2021   Unilateral primary osteoarthritis, right knee 12/15/2019   Generalized weakness 09/14/2019   Severe major depression ([name]) 11/17/2018   Class 2 obesity due to excess calories without serious comorbidity with body mass index (BMI) of 36.0 to 36.9 in adult 11/17/2018   Anticoagulated 08/24/2018   Dyslipidemia 07/27/2018   Essential hypertension 06/25/2018   Hypogonadism in male 05/10/2018   Numbness 03/31/2018   Diabetic peripheral neuropathy ([name]) 01/25/2018   S/P transmetatarsal amputation of foot, right ([name]) 10/09/2017   Hypothyroidism    Constipation    Obesity, Class III, BMI 40-49.9 (morbid obesity) ([name]) 01/15/2017   DOE (dyspnea on exertion) 01/13/2017   Imbalance 11/18/2016   Osteomyelitis of right foot ([name]) 09/22/2016   Chronic migraine 09/17/2016   Gait abnormality 09/17/2016   Fall 12/05/2015   Toe ulcer, right ([name]) 09/19/2015   Decreased pedal pulses 09/19/2015   OSA on CPAP 09/05/2015   Major depressive disorder, recurrent episode, moderate ([name]) 09/05/2015   Recurrent falls while walking 06/24/2015   H/O migraine 06/22/2015   Sinus headache 06/21/2015   OSA (obstructive sleep apnea) 06/20/2015   History of DVT (deep vein thrombosis) 06/20/2015   History of falling 06/20/2015   Chronic, continuous use of opioids 06/20/2015   Coagulopathy ([name]) 06/20/2015   Elevated CPK 06/20/2015   Pain syndrome, chronic 06/20/2015   Thrombocythemia 06/20/2015   Uncontrolled type 2 diabetes mellitus with hyperglycemia ([name]) 06/20/2015   Aspiration into airway 06/20/2015   Low back pain 06/11/2015   Unspecified abnormalities of gait and mobility 06/11/2015   Benign paroxysmal positional vertigo of right ear 05/01/2015   Chronic kidney disease, stage III (moderate) ([name]) 08/09/2014    Renal insufficiency 08/09/2014   Insulin-requiring or dependent type II diabetes mellitus ([name]) 11/07/2013   Diabetes mellitus ([name]) 11/07/2013   Abnormal result on screening urine test 09/03/2006   Human immunodeficiency virus (HIV) infection ([name]) 06/04/2006   Herpes zoster 06/04/2006   Depression 06/04/2006   THROMBOPHLEBITIS NOS 06/04/2006   Gastroesophageal reflux disease 06/04/2006   ARTHRITIS, HAND 06/04/2006   Attention deficit hyperactivity disorder 06/04/2006   Abnormal blood chemistry level 06/04/2006   Past Medical History:  Diagnosis Date   ADHD (attention deficit hyperactivity disorder)    Anxiety    Chronic kidney disease    Clotting disorder ([name])    Depression    Diabetes mellitus without complication ([name])    Diabetes mellitus, type II ([name])    Dizziness 03/17/2015   Essential hypertension 06/25/2018   GERD (gastroesophageal reflux disease)    HIV disease ([name])    HIV infection ([name])    Liver disease    OSA (obstructive sleep apnea) 07/25/2015   Uses CPAP regularly   Peripheral vascular disease ([name])    Ulcer    Past Surgical History:  Procedure Laterality Date   AMPUTATION Right 10/02/2017   Procedure: RIGHT TRANSMETATARSAL AMPUTATION;  Surgeon: Tarry Kos, MD;  Location: [facility] OR;  Service: Orthopedics;  Laterality: Right;   SMALL INTESTINE SURGERY     STOMACH SURGERY     TOE AMPUTATION Right 08/2016   right great toe   Allergies  Allergen Reactions   Aspirin Swelling   Efavirenz Swelling and Rash    Other reaction(s): anaphylaxis   Ibuprofen Swelling   Nsaids Other (See Comments)    unknwn   Pravastatin     myalgias   Lipitor [Atorvastatin Calcium] Other (See Comments)    Leg pain   Prior to Admission medications   Medication Sig Start Date End Date Taking? Authorizing Provider  acetaminophen (TYLENOL) 500 MG tablet Take 1,500 mg by mouth in the morning and at bedtime.   Yes [provider]  ALPRAZolam Prudy Feeler) 1 MG tablet Take 1 mg by  mouth at bedtime. *May take one tablet up to 4 times daily as needed for anxiety   Yes [provider]  amphetamine-dextroamphetamine (ADDERALL) 30 MG tablet Take 30 mg by mouth 3 (three) times daily.   Yes [provider]  Brexpiprazole (REXULTI) 0.5 MG TABS Take by mouth.   Yes [provider]  Continuous Blood Gluc Receiver (FREESTYLE LIBRE 2 READER) DEVI Use as instructed to check blood sugars. 04/18/21  Yes Romero Belling, MD  dapagliflozin propanediol (FARXIGA) 10 MG TABS tablet Take 1 tablet (10 mg total) by mouth daily before breakfast. 03/19/22  Yes Carlus Pavlov, MD  Dextromethorphan-buPROPion ER (AUVELITY) 45-105 MG TBCR Take 1 tablet by mouth daily at 12 noon.   Yes [provider]  diclofenac Sodium (VOLTAREN) 1 % GEL APPLY 2 GM TO EACH KNEE EVERY MORNING AND EVERY NIGHT AT BEDTIME, AND APPLY 1 GM TO EACH KNEE EVERY AFTERNOON 12/19/21  Yes Shade Flood, MD  divalproex (DEPAKOTE ER) 500 MG 24 hr tablet TAKE 1 TABLET BY MOUTH AT BEDTIME 06/06/22  Yes Shade Flood, MD  Dulaglutide (TRULICITY) 4.5 MG/0.5ML SOPN Inject 4.5 mg as directed once a week. 12/25/21  Yes Carlus Pavlov, MD  Evolocumab (REPATHA SURECLICK) 140 MG/ML SOAJ Inject 140 mg into the skin every 14 (fourteen) days. 11/18/21  Yes Chilton Si, MD  ezetimibe (ZETIA) 10 MG tablet TAKE 1 TABLET(10 MG) BY MOUTH DAILY 02/13/22  Yes Chilton Si, MD  finasteride (PROSCAR) 5 MG tablet Take 5 mg by mouth daily. 04/11/22  Yes [provider]  furosemide (LASIX) 40 MG tablet TAKE 1 TABLET(40 MG) BY MOUTH DAILY 12/19/21  Yes Shade Flood, MD  insulin glargine (LANTUS SOLOSTAR) 100 UNIT/ML Solostar Pen Inject 80-90 Units into the skin every morning. 01/21/22  Yes Carlus Pavlov, MD  insulin lispro (HUMALOG KWIKPEN) 100 UNIT/ML KwikPen 15 units with breakfast and 25 units with supper. 05/30/22  Yes Carlus Pavlov, MD  Insulin Pen Needle (B-D ULTRAFINE III SHORT PEN)  31G X 8 MM MISC USE AS DIRECTED THREE TIMES DAILY 05/28/22  Yes Carlus Pavlov, MD  Lemborexant (DAYVIGO) 10 MG TABS Take 10 mg by mouth at bedtime.   Yes [provider]  levothyroxine (SYNTHROID, LEVOTHROID) 50 MCG tablet Take 1 tablet (50 mcg total) by mouth at bedtime. 07/24/15  Yes Collene Gobble, MD  ondansetron (ZOFRAN) 4 MG tablet Take 1-2 tablets (4-8 mg total) by mouth every 8 (eight) hours as needed for nausea or vomiting. 05/21/22  Yes Shade Flood, MD  pantoprazole (PROTONIX) 40 MG tablet TAKE 1 TABLET(40 MG) BY MOUTH DAILY 05/28/22  Yes Shade Flood, MD  pregabalin (LYRICA) 300 MG capsule TAKE 1 CAPSULE(300 MG) BY MOUTH  TWICE DAILY 06/12/22  Yes Shade Flood, MD  Semaglutide,0.25 or 0.5MG /DOS, 2 MG/3ML SOPN Inject 0.5 mg weekly under skin 05/08/22  Yes Carlus Pavlov, MD  silodosin (RAPAFLO) 8 MG CAPS capsule Take 8 mg by mouth daily.   Yes [provider]  TRIUMEQ 600-50-300 MG tablet TAKE 1 TABLET BY MOUTH DAILY 05/12/22  Yes Comer, Belia Heman, MD  XARELTO 20 MG TABS tablet TAKE 1 TABLET(20 MG) BY MOUTH DAILY WITH SUPPER 02/17/22  Yes Shade Flood, MD   Social History   Socioeconomic History   Marital status: Single    Spouse name: Not on file   Number of children: Not on file   Years of education: Not on file   Highest education level: Not on file  Occupational History    Comment: DISABILITY  Tobacco Use   Smoking status: Former    Packs/day: 0.10    Years: 10.00    Additional pack years: 0.00    Total pack years: 1.00    Types: Cigars, Cigarettes    Quit date: 08/09/2014    Years since quitting: 7.8   Smokeless tobacco: Never  Vaping Use   Vaping Use: Never used  Substance and Sexual Activity   Alcohol use: No    Alcohol/week: 0.0 standard drinks of alcohol   Drug use: No   Sexual activity: Not Currently    Partners: Male    Comment: pt. declined condoms  Other Topics Concern   Not on file  Social History Narrative   Epworth  Sleepiness Scale = 7 (as of 03/16/2015)   Social Determinants of Health   Financial Resource Strain: Medium Risk (02/20/2021)   Overall Financial Resource Strain (CARDIA)    Difficulty of Paying Living Expenses: Somewhat hard  Food Insecurity: No Food Insecurity (04/22/2022)   Hunger Vital Sign    Worried About Running Out of Food in the Last Year: Never true    Ran Out of Food in the Last Year: Never true  Transportation Needs: No Transportation Needs (04/22/2022)   PRAPARE - Administrator, Civil Service (Medical): No    Lack of Transportation (Non-Medical): No  Physical Activity: Inactive (10/01/2017)   Exercise Vital Sign    Days of Exercise per Week: 0 days    Minutes of Exercise per Session: 0 min  Stress: No Stress Concern Present (10/01/2017)   [employer] of Occupational Health - Occupational Stress Questionnaire    Feeling of Stress : Only a little  Social Connections: Moderately Isolated (10/01/2017)   Social Connection and Isolation Panel [NHANES]    Frequency of Communication with Friends and Family: More than three times a week    Frequency of Social Gatherings with Friends and Family: Once a week    Attends Religious Services: Never    Database administrator or Organizations: No    Attends Engineer, structural: Not asked    Marital Status: Never married  Intimate Partner Violence: Not At Risk (10/01/2017)   Humiliation, Afraid, Rape, and Kick questionnaire    Fear of Current or Ex-Partner: No    Emotionally Abused: No    Physically Abused: No    Sexually Abused: No    Review of Systems   Objective:   Vitals:   06/23/22 1518  BP: 134/76  Pulse: 72  Temp: 98.2 F (36.8 C)  SpO2: 96%  Weight: 254 lb 3.2 oz (115.3 kg)  Height: 5\' 10"  (1.778 m)     Physical Exam  Vitals reviewed.  Constitutional:      Appearance: He is well-developed.  HENT:     Head: Normocephalic and atraumatic.  Neck:     Vascular: No carotid bruit or JVD.   Cardiovascular:     Rate and Rhythm: Normal rate and regular rhythm.     Heart sounds: Normal heart sounds. No murmur heard. Pulmonary:     Effort: Pulmonary effort is normal.     Breath sounds: Normal breath sounds. No rales.  Musculoskeletal:     Right lower leg: Edema present.     Left lower leg: Edema present.     Comments: Calves nontender.   Skin:    General: Skin is warm and dry.  Neurological:     Mental Status: He is alert and oriented to person, place, and time.  Psychiatric:        Mood and Affect: Mood normal.      Assessment & Plan:  AARAN ENBERG is a 66 y.o. male . Essential hypertension  Hypothyroidism, unspecified type  Mixed hyperlipidemia  Type 2 diabetes mellitus with stage 3a chronic kidney disease, with long-term current use of insulin ([name])   No orders of the defined types were placed in this encounter.  There are no Patient Instructions on file for this visit.    Signed,   Meredith Staggers, MD [facility], [facility] 06/23/22 3:44 PM

## 2022-06-24 ENCOUNTER — Encounter: Payer: Self-pay | Admitting: Family Medicine

## 2022-06-24 MED ORDER — DIVALPROEX SODIUM ER 500 MG PO TB24: 500.0000 mg | ORAL_TABLET | Freq: Every day | ORAL | 1 refills | Status: DC

## 2022-06-24 MED ORDER — DICLOFENAC SODIUM 1 % EX GEL: CUTANEOUS | 2 refills | Status: DC

## 2022-06-24 MED ORDER — RIVAROXABAN 20 MG PO TABS: ORAL_TABLET | ORAL | 2 refills | Status: DC

## 2022-06-30 NOTE — Telephone Encounter (Signed)
Unfortunately, his insurance will only cover ondansetron if it is being prescribed due to nausea from surgery or chemotherapy.

## 2022-07-01 NOTE — Telephone Encounter (Signed)
Message sent regarding Zofran.

## 2022-07-04 ENCOUNTER — Encounter: Payer: Self-pay | Admitting: Family Medicine

## 2022-07-15 ENCOUNTER — Other Ambulatory Visit: Payer: Self-pay

## 2022-07-18 ENCOUNTER — Other Ambulatory Visit: Payer: Self-pay

## 2022-07-18 DIAGNOSIS — Z794 Long term (current) use of insulin: Secondary | ICD-10-CM

## 2022-07-18 DIAGNOSIS — E1122 Type 2 diabetes mellitus with diabetic chronic kidney disease: Secondary | ICD-10-CM

## 2022-07-18 MED ORDER — TRULICITY 4.5 MG/0.5ML ~~LOC~~ SOAJ
4.5000 mg | SUBCUTANEOUS | 3 refills | Status: DC
Start: 2022-07-18 — End: 2022-09-19

## 2022-07-29 ENCOUNTER — Telehealth: Payer: Self-pay | Admitting: Pharmacist

## 2022-07-29 NOTE — Progress Notes (Unsigned)
Care Management & Coordination Services Pharmacy Team   Reason for Encounter: Hypertension   Contacted patient to discuss hypertension disease state. {US HC Outreach:28874}    Current antihypertensive regimen:  amLODipine (NORVASC) 5 MG tablet  furosemide (LASIX) 40 MG tablet    Patient verbally confirms he is taking the above medications as directed. {yes/no:20286}  How often are you checking your Blood Pressure? {CHL HP BP Monitoring Frequency:[phone]}  he checks his blood pressure {timing:25218} {before/after:25217} taking his medication.  Current home BP readings: ***  DATE:             BP               PULSE   Wrist or arm cuff: Caffeine intake: Salt intake: OTC medications including pseudoephedrine or NSAIDs?  Any readings above 180/100? {yes/no:20286} If yes any symptoms of hypertensive emergency? {hypertensive emergency symptoms:25354}  What recent interventions/DTPs have been made by any provider to improve Blood Pressure control since last CPP Visit:  Patient reported  Any recent hospitalizations or ED visits since last visit with CPP? No  What diet changes have been made to improve Blood Pressure Control?  Patient reported  What exercise is being done to improve your Blood Pressure Control?  Patient reported  Adherence Review: Is the patient currently on ACE/ARB medication? No Does the patient have >5 day gap between last estimated fill dates? No  Star Rating Drugs:  Medication:   Last Fill: Day Supply  Dulaglutide 4.5 MG/0.5ML 07/18/22 60 dapagliflozin propanediol 10 MG 07/23/22 30 Semaglutide,0.25 or 0.5MG  ?  ?   Chart Updates: Recent office visits:  06/23/22 Meredith Staggers, MD - Hypertension - Diclofenac gel rx's apply four times daily. Follow up in 6 months.   Recent consult visits:  05/29/22 Jethro Bolus - Ophthalmology - Eye exam completed  Hospital visits:  None in previous 6 months  Medications: Outpatient Encounter Medications as  of 07/29/2022  Medication Sig Note   acetaminophen (TYLENOL) 500 MG tablet Take 1,500 mg by mouth in the morning and at bedtime.    ALPRAZolam (XANAX) 1 MG tablet Take 1 mg by mouth at bedtime. *May take one tablet up to 4 times daily as needed for anxiety    amLODipine (NORVASC) 5 MG tablet Take by mouth.    amphetamine-dextroamphetamine (ADDERALL) 30 MG tablet Take 30 mg by mouth 3 (three) times daily. 02/20/2021: Patient 75mg  per day   Brexpiprazole (REXULTI) 0.5 MG TABS Take by mouth.    Continuous Blood Gluc Receiver (FREESTYLE LIBRE 2 READER) DEVI Use as instructed to check blood sugars.    dapagliflozin propanediol (FARXIGA) 10 MG TABS tablet Take 1 tablet (10 mg total) by mouth daily before breakfast.    Dextromethorphan-buPROPion ER (AUVELITY) 45-105 MG TBCR Take 1 tablet by mouth daily at 12 noon.    diclofenac Sodium (VOLTAREN) 1 % GEL APPLY 2 GM TO EACH KNEE EVERY MORNING AND EVERY NIGHT AT BEDTIME, AND APPLY 1 GM TO EACH KNEE EVERY AFTERNOON    divalproex (DEPAKOTE ER) 500 MG 24 hr tablet Take 1 tablet (500 mg total) by mouth at bedtime.    Dulaglutide (TRULICITY) 4.5 MG/0.5ML SOPN Inject 4.5 mg as directed once a week.    Evolocumab (REPATHA SURECLICK) 140 MG/ML SOAJ Inject 140 mg into the skin every 14 (fourteen) days.    ezetimibe (ZETIA) 10 MG tablet TAKE 1 TABLET(10 MG) BY MOUTH DAILY    finasteride (PROSCAR) 5 MG tablet Take 5 mg by mouth daily.  furosemide (LASIX) 40 MG tablet TAKE 1 TABLET(40 MG) BY MOUTH DAILY    insulin glargine (LANTUS SOLOSTAR) 100 UNIT/ML Solostar Pen Inject 80-90 Units into the skin every morning.    insulin lispro (HUMALOG KWIKPEN) 100 UNIT/ML KwikPen 15 units with breakfast and 25 units with supper.    Insulin Pen Needle (B-D ULTRAFINE III SHORT PEN) 31G X 8 MM MISC USE AS DIRECTED THREE TIMES DAILY    Lemborexant (DAYVIGO) 10 MG TABS Take 10 mg by mouth at bedtime.    levothyroxine (SYNTHROID, LEVOTHROID) 50 MCG tablet Take 1 tablet (50 mcg total)  by mouth at bedtime.    ondansetron (ZOFRAN) 4 MG tablet Take 1-2 tablets (4-8 mg total) by mouth every 8 (eight) hours as needed for nausea or vomiting.    pantoprazole (PROTONIX) 40 MG tablet TAKE 1 TABLET(40 MG) BY MOUTH DAILY    pregabalin (LYRICA) 300 MG capsule TAKE 1 CAPSULE(300 MG) BY MOUTH TWICE DAILY    rivaroxaban (XARELTO) 20 MG TABS tablet TAKE 1 TABLET(20 MG) BY MOUTH DAILY WITH SUPPER    Semaglutide,0.25 or 0.5MG /DOS, 2 MG/3ML SOPN Inject 0.5 mg weekly under skin    silodosin (RAPAFLO) 8 MG CAPS capsule Take 8 mg by mouth daily.    TRIUMEQ 600-50-300 MG tablet TAKE 1 TABLET BY MOUTH DAILY    No facility-administered encounter medications on file as of 07/29/2022.    Recent Office Vitals: BP Readings from Last 3 Encounters:  06/23/22 134/76  05/14/22 (!) 148/80  05/08/22 128/82   Pulse Readings from Last 3 Encounters:  06/23/22 72  05/14/22 95  05/08/22 75    Wt Readings from Last 3 Encounters:  06/23/22 254 lb 3.2 oz (115.3 kg)  05/14/22 250 lb (113.4 kg)  05/08/22 247 lb 12.8 oz (112.4 kg)     Kidney Function Lab Results  Component Value Date/Time   CREATININE 1.53 (H) 04/14/2022 03:29 PM   CREATININE 1.86 (H) 12/19/2021 04:24 PM   CREATININE 1.73 (H) 10/08/2021 02:43 PM   CREATININE 1.69 (H) 09/13/2019 05:14 PM   GFR 36.78 (L) 12/19/2021 04:24 PM   GFRNONAA 46 (L) 01/04/2020 03:46 PM   GFRNONAA 41 (L) 09/13/2019 05:14 PM   GFRAA 53 (L) 01/04/2020 03:46 PM   GFRAA 48 (L) 09/13/2019 05:14 PM       Latest Ref Rng & Units 04/14/2022    3:29 PM 12/19/2021    4:24 PM 10/08/2021    2:43 PM  BMP  Glucose 65 - 99 mg/dL 604  540  97   BUN 7 - 25 mg/dL 23  32  21   Creatinine 0.70 - 1.35 mg/dL 9.81  1.91  4.78   BUN/Creat Ratio 6 - 22 (calc) 15   12   Sodium 135 - 146 mmol/L 141  137  144   Potassium 3.5 - 5.3 mmol/L 3.7  4.7  4.0   Chloride 98 - 110 mmol/L 104  101  105   CO2 20 - 32 mmol/L 24  27  21    Calcium 8.6 - 10.3 mg/dL 9.5  9.5  9.6      Future  Appointments  Date Time Provider Department Center  08/12/2022  2:00 PM Fleeta Emmer, RN [facility] None  09/08/2022  4:00 PM Carlus Pavlov, MD [facility] None  12/24/2022  4:00 PM Shade Flood, MD [facility]  04/15/2023  4:00 PM [facility]  04/29/2023  3:45 PM Comer, Belia Heman, MD [facility]  05/18/2023  2:00 PM Ethelene Browns,  Aundra Millet, NP [facility] None     [employer]

## 2022-07-31 DIAGNOSIS — M79676 Pain in unspecified toe(s): Secondary | ICD-10-CM | POA: Diagnosis not present

## 2022-07-31 DIAGNOSIS — E1142 Type 2 diabetes mellitus with diabetic polyneuropathy: Secondary | ICD-10-CM | POA: Diagnosis not present

## 2022-07-31 DIAGNOSIS — L84 Corns and callosities: Secondary | ICD-10-CM | POA: Diagnosis not present

## 2022-07-31 DIAGNOSIS — B351 Tinea unguium: Secondary | ICD-10-CM | POA: Diagnosis not present

## 2022-08-06 ENCOUNTER — Other Ambulatory Visit (HOSPITAL_COMMUNITY): Payer: Self-pay

## 2022-08-12 ENCOUNTER — Ambulatory Visit: Payer: Self-pay

## 2022-08-12 NOTE — Patient Outreach (Signed)
  Care Coordination   08/12/2022 Name: Daniel [name] MRN: [id number] DOB: May 14, 1955   Care Coordination Outreach Attempts:  An unsuccessful telephone outreach was attempted today to offer the patient information about available care coordination services.  Follow Up Plan:  Additional outreach attempts will be made to offer the patient care coordination information and services.   Encounter Outcome:  No Answer   Care Coordination Interventions:  No, not indicated    Bary Leriche, RN, MSN [facility] Care Management Care Management Coordinator Direct Line [phone]

## 2022-08-18 ENCOUNTER — Telehealth: Payer: Self-pay

## 2022-08-18 NOTE — Patient Instructions (Signed)
Visit Information  Thank you for taking time to visit with me today. Please don't hesitate to contact me if I can be of assistance to you.   Following are the goals we discussed today:   Goals Addressed             This Visit's Progress    Diabetes Management       Patient Goals/Self Care Activities: -Patient/Caregiver will self-administer medications as prescribed as evidenced by self-report/primary caregiver report  -Patient/Caregiver will attend all scheduled provider appointments as evidenced by clinician review of documented attendance to scheduled appointments and patient/caregiver report -Patient/Caregiver verbalizes understanding of plan  -check blood sugar at prescribed times -check blood sugar if I feel it is too high or too low -record values and write them down take them to all doctor visits  -schedule appointment with eye doctor  -Last blood sugar 136.  Reinforced diabetes management          Our next appointment is by telephone on 11/04/22 at 1200 pm  Please call the care guide team at [phone] if you need to cancel or reschedule your appointment.   If you are experiencing a Mental Health or Behavioral Health Crisis or need someone to talk to, please call the Suicide and Crisis Lifeline: 988   Patient verbalizes understanding of instructions and care plan provided today and agrees to view in MyChart. Active MyChart status and patient understanding of how to access instructions and care plan via MyChart confirmed with patient.     The patient has been provided with contact information for the care management team and has been advised to call with any health related questions or concerns.   Bary Leriche, RN, MSN [facility] Care Management Care Management Coordinator Direct Line [phone]

## 2022-08-18 NOTE — Patient Outreach (Signed)
  Care Coordination   Follow Up Visit Note   08/18/2022 Name: MILEN LENGACHER MRN: [id number] DOB: 30-May-1955  ROSHAN ROBACK is a 67 y.o. year old male who sees Shade Flood, MD for primary care. I spoke with  Emeterio Reeve by phone today.  What matters to the patients health and wellness today?  Staying healthy    Goals Addressed             This Visit's Progress    Diabetes Management       Patient Goals/Self Care Activities: -Patient/Caregiver will self-administer medications as prescribed as evidenced by self-report/primary caregiver report  -Patient/Caregiver will attend all scheduled provider appointments as evidenced by clinician review of documented attendance to scheduled appointments and patient/caregiver report -Patient/Caregiver verbalizes understanding of plan  -check blood sugar at prescribed times -check blood sugar if I feel it is too high or too low -record values and write them down take them to all doctor visits  -schedule appointment with eye doctor  -Last blood sugar 136.  Reinforced diabetes management          SDOH assessments and interventions completed:  Yes  SDOH Interventions Today    Flowsheet Row Most Recent Value  SDOH Interventions   Depression Interventions/Treatment  Currently on Treatment        Care Coordination Interventions:  Yes, provided   Follow up plan: Follow up call scheduled for September    Encounter Outcome:  Pt. Visit Completed   Bary Leriche, RN, MSN [facility] Care Management Care Management Coordinator Direct Line [phone]

## 2022-08-19 ENCOUNTER — Other Ambulatory Visit: Payer: Self-pay

## 2022-08-19 MED ORDER — FREESTYLE LIBRE 2 SENSOR MISC: 1 refills | Status: DC

## 2022-08-20 ENCOUNTER — Ambulatory Visit: Payer: PPO | Admitting: Family Medicine

## 2022-09-01 DIAGNOSIS — F3342 Major depressive disorder, recurrent, in full remission: Secondary | ICD-10-CM | POA: Diagnosis not present

## 2022-09-01 DIAGNOSIS — F5101 Primary insomnia: Secondary | ICD-10-CM | POA: Diagnosis not present

## 2022-09-01 DIAGNOSIS — F900 Attention-deficit hyperactivity disorder, predominantly inattentive type: Secondary | ICD-10-CM | POA: Diagnosis not present

## 2022-09-01 DIAGNOSIS — F410 Panic disorder [episodic paroxysmal anxiety] without agoraphobia: Secondary | ICD-10-CM | POA: Diagnosis not present

## 2022-09-04 ENCOUNTER — Ambulatory Visit: Payer: PPO | Admitting: Family Medicine

## 2022-09-04 ENCOUNTER — Encounter (HOSPITAL_BASED_OUTPATIENT_CLINIC_OR_DEPARTMENT_OTHER): Payer: Self-pay | Admitting: Cardiovascular Disease

## 2022-09-04 ENCOUNTER — Encounter: Payer: Self-pay | Admitting: Family Medicine

## 2022-09-08 ENCOUNTER — Telehealth: Payer: PPO | Admitting: Internal Medicine

## 2022-09-08 ENCOUNTER — Encounter (HOSPITAL_BASED_OUTPATIENT_CLINIC_OR_DEPARTMENT_OTHER): Payer: Self-pay | Admitting: Cardiovascular Disease

## 2022-09-08 ENCOUNTER — Encounter: Payer: Self-pay | Admitting: Internal Medicine

## 2022-09-08 DIAGNOSIS — N1831 Chronic kidney disease, stage 3a: Secondary | ICD-10-CM

## 2022-09-08 DIAGNOSIS — E1122 Type 2 diabetes mellitus with diabetic chronic kidney disease: Secondary | ICD-10-CM

## 2022-09-08 DIAGNOSIS — Z794 Long term (current) use of insulin: Secondary | ICD-10-CM

## 2022-09-08 MED ORDER — FREESTYLE LIBRE 3 SENSOR MISC: 1.0000 | Status: DC

## 2022-09-08 MED ORDER — LANTUS SOLOSTAR 100 UNIT/ML ~~LOC~~ SOPN: 80.0000 [IU] | PEN_INJECTOR | SUBCUTANEOUS | 3 refills | Status: DC

## 2022-09-08 MED ORDER — INSULIN LISPRO (1 UNIT DIAL) 100 UNIT/ML (KWIKPEN): PEN_INJECTOR | SUBCUTANEOUS | Status: DC

## 2022-09-08 NOTE — Patient Instructions (Signed)
Please continue: - Farxiga 10 mg before b'fast - Trulicity 4.5 mg weekly - Lantus 100 units  in a.m. - Novolog 15 minutes before meals: 20 units in a.m. and 30 units before dinner  Try to stop eating at night, but if you have a snack, you may need to cover it with Novolog 5-8 units.  Please return in 3-4 months.

## 2022-09-08 NOTE — Progress Notes (Signed)
Patient ID: Daniel [name], male   DOB: 1955-12-08, 67 y.o.   MRN: [id number]  Virtual Visit via Video Note  Patient location: Home My location: Office  Participants: patient, provider  I connected with Daniel [name] on 09/08/22 at  3:56 PM EDT by a video enabled telemedicine application and verified that I am speaking with the correct person using two identifiers.   I discussed the limitations of evaluation and management by telemedicine and the availability of in person appointments. The patient expressed understanding and agreed to proceed.  HPI: Daniel [name] is a 67 y.o.-year-old male, returning for follow-up for DM2, dx in 2015, insulin-dependent since 2016, uncontrolled, with complications (PAD, foot ulcer, status post right transmetatarsal amputation, stage IIIa CKD, PN). Pt. previously saw Dr. Everardo All, but last visit with me 4 months ago.  Interim history: He has increased urination, no blurry vision, chest pain. He has nausea and reflux. He has dry and watery eyes -he feels that this is related to the CPAP machine.  Reviewed HbA1c: Lab Results  Component Value Date   HGBA1C 6.6 (A) 05/08/2022   HGBA1C 6.4 (A) 01/21/2022   HGBA1C 6.6 (A) 09/19/2021   HGBA1C 7.0 (A) 04/11/2021   HGBA1C 7.2 (A) 01/09/2021   HGBA1C 7.5 (A) 10/08/2020   HGBA1C 6.3 (A) 05/07/2020   HGBA1C 6.2 (A) 08/24/2019   HGBA1C 7.2 (A) 05/11/2019   HGBA1C 7.5 (A) 02/01/2019   Pt is on a regimen of: - Farxiga 10 mg before b'fast - Trulicity 4.5 mg weekly - not covered this year - Lantus 100 >> 80 >> 100 units  in a.m.  - Novolog 15-20 >> 20 units in a.m. and 25-30 >> 30 units before dinner  He is on a CGM:  Prev.:  Pred.: - am: 100-200 >> 54-150 - 2h after b'fast: n/c - before lunch: n/c - 2h after lunch: n/c - before dinner: 100-250 >> 100-180, 200, 300 - 2h after dinner: n/c - bedtime: n/c - nighttime: n/c Lowest sugar was 50s (took insulin and did not eat) >> 54 >> 55 >> 70; he has  hypoglycemia awareness <60.  Highest sugar was 400s >> 300 >> 314 >> 200.  He drinks 4-5 gallons of 2% milk a week >> trying to cut down.  He was previously drinking whole milk. He sees nutritionist. He eats at night, eats oatmeal + brown sugar.  - + CKD, last BUN/creatinine:  Lab Results  Component Value Date   BUN 23 04/14/2022   BUN 32 (H) 12/19/2021   CREATININE 1.53 (H) 04/14/2022   CREATININE 1.86 (H) 12/19/2021  He is not on an ACE inhibitor/ARB.  -+ HL; last set of lipids: Lab Results  Component Value Date   CHOL 96 04/14/2022   HDL 47 04/14/2022   LDLCALC 11 04/14/2022   TRIG 359 (H) 04/14/2022   CHOLHDL 2.0 04/14/2022  He is on Zetia 10 mg daily, Repatha.  He is intolerant to statins due to myalgias.   He does have a history of NASH.  - last eye exam was in 05/29/2022. No DR. Dr. Nile Riggs.  - + numbness and tingling in his feet.  Last foot exam was 12/19/2021.  He is on Lyrica 300 mg twice a day.  He does not have a hypothyroidism - this is Rx'ed by his psychiatrist - Dr. [name]. Pt is on levothyroxine 50 mcg daily, taken: - in am - fasting - 15-20 min from b'fast  Reviewed his latest TSH levels: Lab Results  Component Value Date   TSH 0.47 05/07/2020   TSH 0.108 (L) 09/14/2019   TSH 0.736 09/17/2016   TSH 0.19 (L) 07/11/2015   TSH 1.067 06/21/2013   TSH 2.762 01/01/2007   He also has a history of HTN, liver disease, OSA, HIV, ADHD, on chronic opioids.  ROS: + see HPI  Past Medical History:  Diagnosis Date   ADHD (attention deficit hyperactivity disorder)    Anxiety    Chronic kidney disease    Clotting disorder ([name])    Depression    Diabetes mellitus without complication ([name])    Diabetes mellitus, type II ([name])    Dizziness 03/17/2015   Essential hypertension 06/25/2018   GERD (gastroesophageal reflux disease)    HIV disease ([name])    HIV infection ([name])    Liver disease    OSA (obstructive sleep apnea) 07/25/2015   Uses CPAP regularly    Peripheral vascular disease ([name])    Ulcer    Past Surgical History:  Procedure Laterality Date   AMPUTATION Right 10/02/2017   Procedure: RIGHT TRANSMETATARSAL AMPUTATION;  Surgeon: Tarry Kos, MD;  Location: [facility] OR;  Service: Orthopedics;  Laterality: Right;   SMALL INTESTINE SURGERY     STOMACH SURGERY     TOE AMPUTATION Right 08/2016   right great toe   Social History   Socioeconomic History   Marital status: Single    Spouse name: Not on file   Number of children: Not on file   Years of education: Not on file   Highest education level: Not on file  Occupational History    Comment: DISABILITY  Tobacco Use   Smoking status: Former    Current packs/day: 0.00    Average packs/day: 0.1 packs/day for 10.0 years (1.0 ttl pk-yrs)    Types: Cigars, Cigarettes    Start date: 08/08/2004    Quit date: 08/09/2014    Years since quitting: 8.0   Smokeless tobacco: Never  Vaping Use   Vaping status: Never Used  Substance and Sexual Activity   Alcohol use: No    Alcohol/week: 0.0 standard drinks of alcohol   Drug use: No   Sexual activity: Not Currently    Partners: Male    Comment: pt. declined condoms  Other Topics Concern   Not on file  Social History Narrative   Epworth Sleepiness Scale = 7 (as of 03/16/2015)   Social Determinants of Health   Financial Resource Strain: Medium Risk (02/20/2021)   Overall Financial Resource Strain (CARDIA)    Difficulty of Paying Living Expenses: Somewhat hard  Food Insecurity: No Food Insecurity (04/22/2022)   Hunger Vital Sign    Worried About Running Out of Food in the Last Year: Never true    Ran Out of Food in the Last Year: Never true  Transportation Needs: No Transportation Needs (04/22/2022)   PRAPARE - Administrator, Civil Service (Medical): No    Lack of Transportation (Non-Medical): No  Physical Activity: Inactive (10/01/2017)   Exercise Vital Sign    Days of Exercise per Week: 0 days    Minutes of Exercise per  Session: 0 min  Stress: No Stress Concern Present (10/01/2017)   [employer] of Occupational Health - Occupational Stress Questionnaire    Feeling of Stress : Only a little  Social Connections: Moderately Isolated (10/01/2017)   Social Connection and Isolation Panel [NHANES]    Frequency of Communication with Friends and Family: More than three times a week  Frequency of Social Gatherings with Friends and Family: Once a week    Attends Religious Services: Never    Database administrator or Organizations: No    Attends Engineer, structural: Not asked    Marital Status: Never married  Intimate Partner Violence: Not At Risk (10/01/2017)   Humiliation, Afraid, Rape, and Kick questionnaire    Fear of Current or Ex-Partner: No    Emotionally Abused: No    Physically Abused: No    Sexually Abused: No   Current Outpatient Medications on File Prior to Visit  Medication Sig Dispense Refill   acetaminophen (TYLENOL) 500 MG tablet Take 1,500 mg by mouth in the morning and at bedtime.     ALPRAZolam (XANAX) 1 MG tablet Take 1 mg by mouth at bedtime. *May take one tablet up to 4 times daily as needed for anxiety     amphetamine-dextroamphetamine (ADDERALL) 30 MG tablet Take 30 mg by mouth 3 (three) times daily.     Brexpiprazole (REXULTI) 0.5 MG TABS Take by mouth.     Continuous Blood Gluc Receiver (FREESTYLE LIBRE 2 READER) DEVI Use as instructed to check blood sugars. 1 each 2   Continuous Glucose Sensor (FREESTYLE LIBRE 2 SENSOR) MISC Check glucose continuously 3 each 1   dapagliflozin propanediol (FARXIGA) 10 MG TABS tablet Take 1 tablet (10 mg total) by mouth daily before breakfast. 90 tablet 3   Dextromethorphan-buPROPion ER (AUVELITY) 45-105 MG TBCR Take 1 tablet by mouth daily at 12 noon.     diclofenac Sodium (VOLTAREN) 1 % GEL APPLY 2 GM TO EACH KNEE EVERY MORNING AND EVERY NIGHT AT BEDTIME, AND APPLY 1 GM TO EACH KNEE EVERY AFTERNOON 300 g 2   divalproex (DEPAKOTE ER) 500 MG 24  hr tablet Take 1 tablet (500 mg total) by mouth at bedtime. 90 tablet 1   Dulaglutide (TRULICITY) 4.5 MG/0.5ML SOPN Inject 4.5 mg as directed once a week. 6 mL 3   Evolocumab (REPATHA SURECLICK) 140 MG/ML SOAJ Inject 140 mg into the skin every 14 (fourteen) days. 2 mL 11   ezetimibe (ZETIA) 10 MG tablet TAKE 1 TABLET(10 MG) BY MOUTH DAILY 90 tablet 2   finasteride (PROSCAR) 5 MG tablet Take 5 mg by mouth daily.     furosemide (LASIX) 40 MG tablet TAKE 1 TABLET(40 MG) BY MOUTH DAILY 90 tablet 3   insulin glargine (LANTUS SOLOSTAR) 100 UNIT/ML Solostar Pen Inject 80-90 Units into the skin every morning. 105 mL 3   insulin lispro (HUMALOG KWIKPEN) 100 UNIT/ML KwikPen 15 units with breakfast and 25 units with supper. 45 mL 3   Insulin Pen Needle (B-D ULTRAFINE III SHORT PEN) 31G X 8 MM MISC USE AS DIRECTED THREE TIMES DAILY 100 each 5   Lemborexant (DAYVIGO) 10 MG TABS Take 10 mg by mouth at bedtime.     levothyroxine (SYNTHROID, LEVOTHROID) 50 MCG tablet Take 1 tablet (50 mcg total) by mouth at bedtime. 30 tablet 11   ondansetron (ZOFRAN) 4 MG tablet Take 1-2 tablets (4-8 mg total) by mouth every 8 (eight) hours as needed for nausea or vomiting. 30 tablet 1   pantoprazole (PROTONIX) 40 MG tablet TAKE 1 TABLET(40 MG) BY MOUTH DAILY 90 tablet 1   pregabalin (LYRICA) 300 MG capsule TAKE 1 CAPSULE(300 MG) BY MOUTH TWICE DAILY 60 capsule 2   rivaroxaban (XARELTO) 20 MG TABS tablet TAKE 1 TABLET(20 MG) BY MOUTH DAILY WITH SUPPER 90 tablet 2   silodosin (RAPAFLO) 8 MG CAPS capsule  Take 8 mg by mouth daily.     TRIUMEQ 600-50-300 MG tablet TAKE 1 TABLET BY MOUTH DAILY 30 tablet 6   No current facility-administered medications on file prior to visit.   Allergies  Allergen Reactions   Aspirin Swelling   Efavirenz Swelling and Rash    Other reaction(s): anaphylaxis   Ibuprofen Swelling   Nsaids Other (See Comments)    unknwn   Pravastatin     myalgias   Lipitor [Atorvastatin Calcium] Other (See  Comments)    Leg pain   Family History  Problem Relation Age of Onset   Depression Brother    Throat cancer Brother        half brother, never smoker   COPD Mother    Diabetes Neg Hx    PE: There were no vitals taken for this visit. Wt Readings from Last 3 Encounters:  06/23/22 254 lb 3.2 oz (115.3 kg)  05/14/22 250 lb (113.4 kg)  05/08/22 247 lb 12.8 oz (112.4 kg)   Constitutional:  in NAD  The physical exam was not performed (virtual visit).  ASSESSMENT: 1. DM2, insulin-dependent, uncontrolled, with complications - PAD - foot ulcer, status post right transmetatarsal amputation - stage IIIa CKD - PN  2.  Hyperlipidemia  3.  Acquired hypothyroidism  PLAN:  1. Patient with longstanding, fairly well-controlled type 2 diabetes, on oral antidiabetic regimen with SGLT2 inhibitor, also weekly GLP-1 receptor agonist and basal/bolus insulin regimen, with improved control at last visit.  HbA1c then was 6.6%.  At last visit, he was reducing milk intake, as advised.  Sugars are fluctuating mostly within the target range but with higher numbers occasionally after dinner and more frequently during the night, when he was staying up late and eating.  We discussed about trying not to eat in the middle of the night but if he did, to cover the snacks/meals with insulin.  He was occasionally eating oatmeal with a lot of brown sugar.  I strongly advised him to stop this but if he did continue with it, to cover it with insulin.   CGM interpretation: -At today's visit, we reviewed his CGM downloads: It appears that 84% of values are in target range (goal >70%), while 16% are higher than 180 (goal <25%), and 0% are lower than 70 (goal <4%).  The calculated average blood sugar is 141.  The projected HbA1c for the next 3 months (GMI) is 6.7%. -Reviewing the CGM trends, sugars are yet to be fluctuating mostly within the target range with only occasional hyperglycemic spikes, but on frequently.  Upon  questioning, patient increase his Lantus dose back to 100 units daily, and we will continue this as he does not have low blood sugars.  Also, he adjusted the dose of Humalog in the morning and at night and these appear to be working well for him.  Will continue these, also.  He tolerates Trulicity well and does not feel that the nausea that he experiences is related to the GLP-1 receptor agonist. -I advised him to: Patient Instructions  Please continue: - Farxiga 10 mg before b'fast - Trulicity 4.5 mg weekly - Lantus 100 units  in a.m. - Novolog 15 minutes before meals: 20 units in a.m. and 30 units before dinner  Try to stop eating at night, but if you have a snack, you may need to cover it with Novolog 5-8 units.  Please return in 3-4 months.  - we we will check his HbA1c when he returns to  the clinic - advised to check sugars at different times of the day - 4x a day, rotating check times - advised for yearly eye exams >> he is UTD - return to clinic in 3-4 months  2. HL and 4.  Drug (statin)-induced myopathy -Reviewed latest lipid panel from 03/2022: LDL very low, triglycerides elevated Lab Results  Component Value Date   CHOL 96 04/14/2022   HDL 47 04/14/2022   LDLCALC 11 04/14/2022   TRIG 359 (H) 04/14/2022   CHOLHDL 2.0 04/14/2022  -He continues to Repatha and Zetia 10 mg daily without side effects.  He could not tolerate statins due to myalgias.  3.  Thyroid hormone use -He was started on thyroid hormones by his psychiatrist, without a diagnosis of hypothyroidism, apparently for symptom control -Last TSH available for review was normal: Lab Results  Component Value Date   TSH 0.47 05/07/2020  -Continues on levothyroxine 50 mcg daily -Management per psychiatry  Carlus Pavlov, MD PhD [facility] Endocr[facility]

## 2022-09-10 ENCOUNTER — Other Ambulatory Visit: Payer: Self-pay | Admitting: Cardiovascular Disease

## 2022-09-10 DIAGNOSIS — E785 Hyperlipidemia, unspecified: Secondary | ICD-10-CM

## 2022-09-10 NOTE — Telephone Encounter (Signed)
Rx(s) sent to pharmacy electronically.  

## 2022-09-16 ENCOUNTER — Encounter: Payer: Self-pay | Admitting: Internal Medicine

## 2022-09-17 ENCOUNTER — Encounter: Payer: Self-pay | Admitting: Adult Health

## 2022-09-17 ENCOUNTER — Telehealth: Payer: Self-pay | Admitting: Adult Health

## 2022-09-17 NOTE — Telephone Encounter (Signed)
I checked [facility]. Patient's machine was setup on 04/09/21 so he won't be eligible for new machine until 2028 but he can certainly talk to [facility] to see if they can fix the machine or see if his machine is under warranty.

## 2022-09-17 NOTE — Telephone Encounter (Signed)
Pt sent a mychart message. I messaged him back there.

## 2022-09-17 NOTE — Telephone Encounter (Signed)
As a result of pt sitting on his CPAP he is asking RN to call him to go about a replacement CPAP

## 2022-09-18 ENCOUNTER — Ambulatory Visit: Payer: PPO | Admitting: Podiatry

## 2022-09-19 ENCOUNTER — Telehealth: Payer: Self-pay

## 2022-09-19 ENCOUNTER — Encounter: Payer: Self-pay | Admitting: Internal Medicine

## 2022-09-19 MED ORDER — SEMAGLUTIDE (2 MG/DOSE) 8 MG/3ML ~~LOC~~ SOPN: 2.0000 mg | PEN_INJECTOR | SUBCUTANEOUS | 3 refills | Status: DC

## 2022-09-19 NOTE — Telephone Encounter (Signed)
Patient called in stating that the Trulicity 4.5 mg is on back order. Please advise on alternative in Dr. Corky Sox absence.

## 2022-09-19 NOTE — Telephone Encounter (Signed)
Patient need a PA on Ozempic

## 2022-09-22 ENCOUNTER — Other Ambulatory Visit (HOSPITAL_BASED_OUTPATIENT_CLINIC_OR_DEPARTMENT_OTHER): Payer: Self-pay

## 2022-09-22 ENCOUNTER — Ambulatory Visit (INDEPENDENT_AMBULATORY_CARE_PROVIDER_SITE_OTHER): Payer: PPO | Admitting: Family Medicine

## 2022-09-22 ENCOUNTER — Other Ambulatory Visit (HOSPITAL_COMMUNITY): Payer: Self-pay

## 2022-09-22 ENCOUNTER — Telehealth: Payer: Self-pay | Admitting: Pharmacy Technician

## 2022-09-22 ENCOUNTER — Encounter: Payer: Self-pay | Admitting: Family Medicine

## 2022-09-22 VITALS — BP 128/80 | HR 79 | Temp 98.1°F | Resp 18 | Ht 70.0 in | Wt 256.4 lb

## 2022-09-22 DIAGNOSIS — E1142 Type 2 diabetes mellitus with diabetic polyneuropathy: Secondary | ICD-10-CM

## 2022-09-22 DIAGNOSIS — R600 Localized edema: Secondary | ICD-10-CM | POA: Diagnosis not present

## 2022-09-22 DIAGNOSIS — S81802A Unspecified open wound, left lower leg, initial encounter: Secondary | ICD-10-CM

## 2022-09-22 MED ORDER — TRULICITY 4.5 MG/0.5ML ~~LOC~~ SOAJ
4.5000 mg | SUBCUTANEOUS | 2 refills | Status: DC
  Filled 2022-09-22: qty 2, 28d supply, fill #0
  Filled 2022-10-20 (×2): qty 2, 28d supply, fill #1
  Filled 2022-11-21: qty 2, 28d supply, fill #2

## 2022-09-22 MED ORDER — DOXYCYCLINE HYCLATE 100 MG PO TABS
100.0000 mg | ORAL_TABLET | Freq: Two times a day (BID) | ORAL | 0 refills | Status: DC
Start: 2022-09-22 — End: 2022-10-08

## 2022-09-22 NOTE — Telephone Encounter (Signed)
Pharmacy Patient Advocate Encounter   Received notification from Pt Calls Messages that prior authorization for Ozempic is required/requested.   Insurance verification completed.   The patient is insured through [employer]/ [employer] .   Med changed to Ozempic and PA required due to Trulicity being on back order. We have it. Verified with pt and pharmacy that he could get it at [facility]). Had the office send prescription there for pt to pick up today.

## 2022-09-22 NOTE — Patient Instructions (Addendum)
Keep wound clean with soap and water cleansing twice per day.  Can stop use of peroxide at this time as that sometimes can cause some irritation to the skin.  Keep clean and covered.  Start antibiotic to help prevent worsening or infection but also increase your fluid pill by 1/2 pill for the next 2 to 3 days and elevate legs when seated if possible to see if that will help with some of the swelling which may also impact the ability for the area to heal.  Recheck in 1 week unless any redness around the area, increased discharge or other worsening.  Take care.

## 2022-09-22 NOTE — Telephone Encounter (Signed)
Done

## 2022-09-22 NOTE — Progress Notes (Signed)
Subjective:  Patient ID: Daniel [name], male    DOB: March 01, 1955  Age: 67 y.o. MRN: [id number]  CC:  Chief Complaint  Patient presents with   Leg Pain    Pt states he has a sore place on his left leg for a few weeks that has became infected Draining red around area  Denies any pain  Has a scab on the wound     HPI Daniel [name] presents for   Leg lesion: Left lower leg - past 1.5-2 weeks. NKI. Initially circle size of nickel, flat, ulcer appearing area.  Some d/c - blood - clear fluid.  No fever.  Some decreased sensation in that area at baseline, no pain.  Tx: Peroxide once per day.   History of peripheral neuropathy, thrombophlebitis on anticoagulation, peripheral vascular disease, lasix 40mg  every day with compression stockings (10-43mmhg)  History Patient Active Problem List   Diagnosis Date Noted   Type 2 diabetes mellitus with diabetic peripheral angiopathy without gangrene ([name]) 06/23/2022   Routine screening for STI (sexually transmitted infection) 04/29/2022   Mixed hyperlipidemia 09/19/2021   Type II diabetes mellitus with peripheral circulatory disorder ([name]) 09/19/2021   Insomnia due to mental condition 05/23/2021   Atherosclerosis of native artery of extremity ([name]) 04/25/2021   Chronic nonalcoholic liver disease 04/25/2021   History of hepatitis B 04/25/2021   Long term (current) use of insulin ([name]) 04/25/2021   Long term (current) use of anticoagulants 04/25/2021   Nonalcoholic steatohepatitis (NASH) 04/25/2021   Type 2 diabetes mellitus with other circulatory complications ([name]) 04/25/2021   Type 2 diabetes mellitus with peripheral angiopathy ([name]) 04/25/2021   Abnormal liver function tests 04/25/2021   Drug-induced myopathy 02/12/2021   Unilateral primary osteoarthritis, right knee 12/15/2019   Generalized weakness 09/14/2019   Severe major depression ([name]) 11/17/2018   Class 2 obesity due to excess calories without serious comorbidity with body mass  index (BMI) of 36.0 to 36.9 in adult 11/17/2018   Anticoagulated 08/24/2018   Dyslipidemia 07/27/2018   Essential hypertension 06/25/2018   Hypogonadism in male 05/10/2018   Numbness 03/31/2018   Diabetic peripheral neuropathy ([name]) 01/25/2018   S/P transmetatarsal amputation of foot, right ([name]) 10/09/2017   Hypothyroidism    Constipation    Obesity, Class III, BMI 40-49.9 (morbid obesity) ([name]) 01/15/2017   DOE (dyspnea on exertion) 01/13/2017   Imbalance 11/18/2016   Osteomyelitis of right foot ([name]) 09/22/2016   Chronic migraine 09/17/2016   Gait abnormality 09/17/2016   Fall 12/05/2015   Toe ulcer, right ([name]) 09/19/2015   Decreased pedal pulses 09/19/2015   OSA on CPAP 09/05/2015   Major depressive disorder, recurrent episode, moderate ([name]) 09/05/2015   Recurrent falls while walking 06/24/2015   H/O migraine 06/22/2015   Sinus headache 06/21/2015   OSA (obstructive sleep apnea) 06/20/2015   History of DVT (deep vein thrombosis) 06/20/2015   History of falling 06/20/2015   Chronic, continuous use of opioids 06/20/2015   Coagulopathy ([name]) 06/20/2015   Elevated CPK 06/20/2015   Pain syndrome, chronic 06/20/2015   Thrombocythemia 06/20/2015   Uncontrolled type 2 diabetes mellitus with hyperglycemia ([name]) 06/20/2015   Aspiration into airway 06/20/2015   Low back pain 06/11/2015   Unspecified abnormalities of gait and mobility 06/11/2015   Chronic kidney disease, stage III (moderate) ([name]) 08/09/2014   Renal insufficiency 08/09/2014   Insulin-requiring or dependent type II diabetes mellitus ([name]) 11/07/2013   Diabetes mellitus ([name]) 11/07/2013   Abnormal result on screening urine test 09/03/2006  Human immunodeficiency virus (HIV) infection ([name]) 06/04/2006   Herpes zoster 06/04/2006   Depression 06/04/2006   THROMBOPHLEBITIS NOS 06/04/2006   Gastroesophageal reflux disease 06/04/2006   ARTHRITIS, HAND 06/04/2006   Attention deficit hyperactivity disorder 06/04/2006    Past Medical History:  Diagnosis Date   ADHD (attention deficit hyperactivity disorder)    Anxiety    Chronic kidney disease    Clotting disorder ([name])    Depression    Diabetes mellitus without complication ([name])    Diabetes mellitus, type II ([name])    Dizziness 03/17/2015   Essential hypertension 06/25/2018   GERD (gastroesophageal reflux disease)    HIV disease ([name])    HIV infection ([name])    Liver disease    OSA (obstructive sleep apnea) 07/25/2015   Uses CPAP regularly   Peripheral vascular disease ([name])    Ulcer    Past Surgical History:  Procedure Laterality Date   AMPUTATION Right 10/02/2017   Procedure: RIGHT TRANSMETATARSAL AMPUTATION;  Surgeon: Tarry Kos, MD;  Location: [facility] OR;  Service: Orthopedics;  Laterality: Right;   SMALL INTESTINE SURGERY     STOMACH SURGERY     TOE AMPUTATION Right 08/2016   right great toe   Allergies  Allergen Reactions   Aspirin Swelling   Efavirenz Swelling and Rash    Other reaction(s): anaphylaxis   Ibuprofen Swelling   Nsaids Other (See Comments)    unknwn   Pravastatin     myalgias   Lipitor [Atorvastatin Calcium] Other (See Comments)    Leg pain   Prior to Admission medications   Medication Sig Start Date End Date Taking? Authorizing Provider  acetaminophen (TYLENOL) 500 MG tablet Take 1,500 mg by mouth in the morning and at bedtime.   Yes [provider]  ALPRAZolam Prudy Feeler) 1 MG tablet Take 1 mg by mouth at bedtime. *May take one tablet up to 4 times daily as needed for anxiety   Yes [provider]  amphetamine-dextroamphetamine (ADDERALL) 30 MG tablet Take 30 mg by mouth 3 (three) times daily.   Yes [provider]  Continuous Glucose Sensor (FREESTYLE LIBRE 3 SENSOR) MISC 1 each by Does not apply route every 14 (fourteen) days. 09/08/22  Yes Carlus Pavlov, MD  dapagliflozin propanediol (FARXIGA) 10 MG TABS tablet Take 1 tablet (10 mg total) by mouth daily before breakfast. 03/19/22  Yes  Carlus Pavlov, MD  Dextromethorphan-buPROPion ER (AUVELITY) 45-105 MG TBCR Take 1 tablet by mouth daily at 12 noon.   Yes [provider]  diclofenac Sodium (VOLTAREN) 1 % GEL APPLY 2 GM TO EACH KNEE EVERY MORNING AND EVERY NIGHT AT BEDTIME, AND APPLY 1 GM TO EACH KNEE EVERY AFTERNOON 06/24/22  Yes Shade Flood, MD  divalproex (DEPAKOTE ER) 500 MG 24 hr tablet Take 1 tablet (500 mg total) by mouth at bedtime. 06/24/22  Yes Shade Flood, MD  Dulaglutide (TRULICITY) 4.5 MG/0.5ML SOPN Inject 4.5 mg into the skin once a week. 09/22/22  Yes Carlus Pavlov, MD  Evolocumab (REPATHA SURECLICK) 140 MG/ML SOAJ Inject 140 mg into the skin every 14 (fourteen) days. 11/18/21  Yes Chilton Si, MD  ezetimibe (ZETIA) 10 MG tablet TAKE 1 TABLET(10 MG) BY MOUTH DAILY 09/10/22  Yes Chilton Si, MD  finasteride (PROSCAR) 5 MG tablet Take 5 mg by mouth daily. 04/11/22  Yes [provider]  furosemide (LASIX) 40 MG tablet TAKE 1 TABLET(40 MG) BY MOUTH DAILY 12/19/21  Yes Shade Flood, MD  insulin glargine (LANTUS SOLOSTAR)  100 UNIT/ML Solostar Pen Inject 80-100 Units into the skin every morning. 09/08/22  Yes Carlus Pavlov, MD  insulin lispro (HUMALOG KWIKPEN) 100 UNIT/ML KwikPen Inject under skin 20 units with breakfast and 30 units with supper. 09/08/22  Yes Carlus Pavlov, MD  Insulin Pen Needle (B-D ULTRAFINE III SHORT PEN) 31G X 8 MM MISC USE AS DIRECTED THREE TIMES DAILY 05/28/22  Yes Carlus Pavlov, MD  Lemborexant (DAYVIGO) 10 MG TABS Take 10 mg by mouth at bedtime.   Yes [provider]  levothyroxine (SYNTHROID, LEVOTHROID) 50 MCG tablet Take 1 tablet (50 mcg total) by mouth at bedtime. 07/24/15  Yes Collene Gobble, MD  pantoprazole (PROTONIX) 40 MG tablet TAKE 1 TABLET(40 MG) BY MOUTH DAILY 05/28/22  Yes Shade Flood, MD  pregabalin (LYRICA) 300 MG capsule TAKE 1 CAPSULE(300 MG) BY MOUTH TWICE DAILY 06/12/22  Yes Shade Flood, MD  rivaroxaban  (XARELTO) 20 MG TABS tablet TAKE 1 TABLET(20 MG) BY MOUTH DAILY WITH SUPPER 06/24/22  Yes Shade Flood, MD  silodosin (RAPAFLO) 8 MG CAPS capsule Take 8 mg by mouth daily.   Yes [provider]  TRIUMEQ 600-50-300 MG tablet TAKE 1 TABLET BY MOUTH DAILY 05/12/22  Yes Comer, Belia Heman, MD  Semaglutide, 2 MG/DOSE, 8 MG/3ML SOPN Inject 2 mg as directed once a week. Patient not taking: Reported on 09/22/2022 09/19/22   Shamleffer, Konrad Dolores, MD   Social History   Socioeconomic History   Marital status: Single    Spouse name: Not on file   Number of children: Not on file   Years of education: Not on file   Highest education level: 12th grade  Occupational History    Comment: DISABILITY  Tobacco Use   Smoking status: Former    Current packs/day: 0.00    Average packs/day: 0.1 packs/day for 10.0 years (1.0 ttl pk-yrs)    Types: Cigars, Cigarettes    Start date: 08/08/2004    Quit date: 08/09/2014    Years since quitting: 8.1   Smokeless tobacco: Never  Vaping Use   Vaping status: Never Used  Substance and Sexual Activity   Alcohol use: No    Alcohol/week: 0.0 standard drinks of alcohol   Drug use: No   Sexual activity: Not Currently    Partners: Male    Comment: pt. declined condoms  Other Topics Concern   Not on file  Social History Narrative   Epworth Sleepiness Scale = 7 (as of 03/16/2015)   Social Determinants of Health   Financial Resource Strain: Low Risk  (09/20/2022)   Overall Financial Resource Strain (CARDIA)    Difficulty of Paying Living Expenses: Not hard at all  Food Insecurity: No Food Insecurity (09/20/2022)   Hunger Vital Sign    Worried About Running Out of Food in the Last Year: Never true    Ran Out of Food in the Last Year: Never true  Transportation Needs: No Transportation Needs (09/20/2022)   PRAPARE - Administrator, Civil Service (Medical): No    Lack of Transportation (Non-Medical): No  Physical Activity: Unknown (09/20/2022)    Exercise Vital Sign    Days of Exercise per Week: 0 days    Minutes of Exercise per Session: Not on file  Stress: No Stress Concern Present (09/20/2022)   [employer] of Occupational Health - Occupational Stress Questionnaire    Feeling of Stress : Only a little  Social Connections: Socially Isolated (09/20/2022)   Social Connection and Isolation  Panel [NHANES]    Frequency of Communication with Friends and Family: Twice a week    Frequency of Social Gatherings with Friends and Family: Once a week    Attends Religious Services: Never    Database administrator or Organizations: No    Attends Engineer, structural: Not on file    Marital Status: Never married  Intimate Partner Violence: Not At Risk (10/01/2017)   Humiliation, Afraid, Rape, and Kick questionnaire    Fear of Current or Ex-Partner: No    Emotionally Abused: No    Physically Abused: No    Sexually Abused: No    Review of Systems   Objective:   Vitals:   09/22/22 1552  BP: 128/80  Pulse: 79  Resp: 18  Temp: 98.1 F (36.7 C)  TempSrc: Temporal  SpO2: 96%  Weight: 256 lb 6 oz (116.3 kg)  Height: 5\' 10"  (1.778 m)     Physical Exam Vitals reviewed.  Constitutional:      Appearance: He is well-developed.  HENT:     Head: Normocephalic and atraumatic.  Neck:     Vascular: No carotid bruit or JVD.  Cardiovascular:     Rate and Rhythm: Normal rate and regular rhythm.     Heart sounds: Normal heart sounds. No murmur heard. Pulmonary:     Effort: Pulmonary effort is normal.     Breath sounds: Normal breath sounds. No rales.  Musculoskeletal:     Right lower leg: No edema.     Left lower leg: No edema.  Skin:    General: Skin is warm and dry.     Comments: 1.5cmx1cm ulcerative appearing area lower left leg, central thin eschar, small amount of oozing of blood peripherally with pressure. 1-2+ pitting edema to LE''s bilateral.   Neurological:     Mental Status: He is alert and oriented to  person, place, and time.  Psychiatric:        Mood and Affect: Mood normal.      Assessment & Plan:  Daniel [name] is a 67 y.o. male . Wound of left lower extremity, initial encounter - Plan: doxycycline (VIBRA-TABS) 100 MG tablet  Diabetic peripheral neuropathy ([name])  Pedal edema  Possible superficial ulceration versus abrasion with slow to heal.  No significant surrounding erythema but intermittent discharge reported, minimal bleeding noted at wound site currently.  Given his diabetes, peripheral neuropathy, peripheral vascular disease will monitor closely and covered for possible early or mild infection with doxycycline at this time.  Close follow-up in 1 week, RTC precautions if any worsening sooner.  Wound cleansing discussed with soap and water, hold on peroxide at this time.  Keep clean and covered.  All questions answered  Meds ordered this encounter  Medications   doxycycline (VIBRA-TABS) 100 MG tablet    Sig: Take 1 tablet (100 mg total) by mouth 2 (two) times daily.    Dispense:  20 tablet    Refill:  0   Patient Instructions  Keep wound clean with soap and water cleansing twice per day.  Can stop use of peroxide at this time as that sometimes can cause some irritation to the skin.  Keep clean and covered.  Start antibiotic to help prevent worsening or infection but also increase your fluid pill by 1/2 pill for the next 2 to 3 days and elevate legs when seated if possible to see if that will help with some of the swelling which may also impact the ability for  the area to heal.  Recheck in 1 week unless any redness around the area, increased discharge or other worsening.  Take care.     Signed,   Meredith Staggers, MD [facility], [facility] 09/22/22 4:27 PM

## 2022-09-29 ENCOUNTER — Ambulatory Visit: Payer: PPO | Admitting: Family Medicine

## 2022-10-06 ENCOUNTER — Other Ambulatory Visit: Payer: Self-pay | Admitting: Family Medicine

## 2022-10-06 ENCOUNTER — Telehealth: Payer: Self-pay

## 2022-10-06 ENCOUNTER — Other Ambulatory Visit: Payer: Self-pay

## 2022-10-06 ENCOUNTER — Ambulatory Visit: Payer: PPO | Admitting: Family Medicine

## 2022-10-06 DIAGNOSIS — M792 Neuralgia and neuritis, unspecified: Secondary | ICD-10-CM

## 2022-10-06 DIAGNOSIS — E1142 Type 2 diabetes mellitus with diabetic polyneuropathy: Secondary | ICD-10-CM

## 2022-10-06 NOTE — Telephone Encounter (Signed)
Waiting on Neva Seat to sign Rx

## 2022-10-06 NOTE — Telephone Encounter (Signed)
Called back stating Lyrica was declined. Sent back July 29

## 2022-10-06 NOTE — Telephone Encounter (Signed)
I have placed a printed copy of the lyrica rx in your folder for sign and I will fax to the requested pharmacy

## 2022-10-06 NOTE — Telephone Encounter (Signed)
Controlled substance database reviewed.  Lyrica No. [id number] last filled on 09/23/2022 at [facility] in [place].  Please clarify, was this not filled/dispensed?

## 2022-10-08 ENCOUNTER — Encounter: Payer: Self-pay | Admitting: Family Medicine

## 2022-10-08 ENCOUNTER — Telehealth: Payer: Self-pay

## 2022-10-08 ENCOUNTER — Telehealth (INDEPENDENT_AMBULATORY_CARE_PROVIDER_SITE_OTHER): Payer: PPO | Admitting: Family Medicine

## 2022-10-08 DIAGNOSIS — M792 Neuralgia and neuritis, unspecified: Secondary | ICD-10-CM | POA: Diagnosis not present

## 2022-10-08 DIAGNOSIS — E1142 Type 2 diabetes mellitus with diabetic polyneuropathy: Secondary | ICD-10-CM

## 2022-10-08 DIAGNOSIS — S81802A Unspecified open wound, left lower leg, initial encounter: Secondary | ICD-10-CM | POA: Diagnosis not present

## 2022-10-08 MED ORDER — PREGABALIN 300 MG PO CAPS
ORAL_CAPSULE | ORAL | 0 refills | Status: DC
Start: 2022-10-08 — End: 2022-11-11

## 2022-10-08 NOTE — Telephone Encounter (Signed)
Re ordered

## 2022-10-08 NOTE — Progress Notes (Signed)
Virtual Visit via Video Note  I connected with [name] on 10/08/22 at 4:23 PM by a video enabled telemedicine application and verified that I am speaking with the correct person using two identifiers.  Patient location: home, by self.  My location: office - [place].    I discussed the limitations, risks, security and privacy concerns of performing an evaluation and management service by telephone and the availability of in person appointments. I also discussed with the patient that there may be a patient responsible charge related to this service. The patient expressed understanding and agreed to proceed, consent obtained  Chief complaint:  Chief Complaint  Patient presents with   Leg Pain    Pt states he has a wound on his left leg No injuries he can recall  Denies any redness ,swollen pt thinks       History of Present Illness: Daniel [name] is a 67 y.o. male  Left lower leg lesion follow-up.  Evaluated in office on July 29.  Noticed a lesion for 1/2 to 2 weeks at that time, initially size of nickel with some initial discharge of blood was clear fluid.  Had been treating with peroxide.  Possible superficial ulceration versus abrasion with his peripheral neuropathy, diabetes and peripheral vascular disease, close monitoring recommended with recheck in 1 week and treated with doxycycline for possible early infection.  Wound care discussed with soap and water cleansing and avoidance of peroxide. \  Visit today for same area. No apparent changes.no surrounding redness. Some possible swelling in leg above, but notes chronic swelling.  No calf pain, no fever, no surrounding redness. No discharge. No change with antibiotic.  Feels like indented. Different than prior scabs.  Sent picture to email? - he will send photo by Mychart.   Lyrica issue - rx sent back to [facility] by [employer] - they tried to deliver at times he was unavailable. Needs new rx. Out for 10 days.     Patient Active Problem List   Diagnosis Date Noted   Type 2 diabetes mellitus with diabetic peripheral angiopathy without gangrene ([name]) 06/23/2022   Routine screening for STI (sexually transmitted infection) 04/29/2022   Mixed hyperlipidemia 09/19/2021   Type II diabetes mellitus with peripheral circulatory disorder ([name]) 09/19/2021   Insomnia due to mental condition 05/23/2021   Atherosclerosis of native artery of extremity ([name]) 04/25/2021   Chronic nonalcoholic liver disease 04/25/2021   History of hepatitis B 04/25/2021   Long term (current) use of insulin ([name]) 04/25/2021   Long term (current) use of anticoagulants 04/25/2021   Nonalcoholic steatohepatitis (NASH) 04/25/2021   Type 2 diabetes mellitus with other circulatory complications ([name]) 04/25/2021   Type 2 diabetes mellitus with peripheral angiopathy ([name]) 04/25/2021   Abnormal liver function tests 04/25/2021   Drug-induced myopathy 02/12/2021   Unilateral primary osteoarthritis, right knee 12/15/2019   Generalized weakness 09/14/2019   Severe major depression ([name]) 11/17/2018   Class 2 obesity due to excess calories without serious comorbidity with body mass index (BMI) of 36.0 to 36.9 in adult 11/17/2018   Anticoagulated 08/24/2018   Dyslipidemia 07/27/2018   Essential hypertension 06/25/2018   Hypogonadism in male 05/10/2018   Numbness 03/31/2018   Diabetic peripheral neuropathy ([name]) 01/25/2018   S/P transmetatarsal amputation of foot, right ([name]) 10/09/2017   Hypothyroidism    Constipation    Obesity, Class III, BMI 40-49.9 (morbid obesity) ([name]) 01/15/2017   DOE (dyspnea on exertion) 01/13/2017   Imbalance 11/18/2016   Osteomyelitis of  right foot ([name]) 09/22/2016   Chronic migraine 09/17/2016   Gait abnormality 09/17/2016   Fall 12/05/2015   Toe ulcer, right ([name]) 09/19/2015   Decreased pedal pulses 09/19/2015   OSA on CPAP 09/05/2015   Major depressive disorder, recurrent episode, moderate ([name])  09/05/2015   Recurrent falls while walking 06/24/2015   H/O migraine 06/22/2015   Sinus headache 06/21/2015   OSA (obstructive sleep apnea) 06/20/2015   History of DVT (deep vein thrombosis) 06/20/2015   History of falling 06/20/2015   Chronic, continuous use of opioids 06/20/2015   Coagulopathy ([name]) 06/20/2015   Elevated CPK 06/20/2015   Pain syndrome, chronic 06/20/2015   Thrombocythemia 06/20/2015   Uncontrolled type 2 diabetes mellitus with hyperglycemia ([name]) 06/20/2015   Aspiration into airway 06/20/2015   Low back pain 06/11/2015   Unspecified abnormalities of gait and mobility 06/11/2015   Chronic kidney disease, stage III (moderate) ([name]) 08/09/2014   Renal insufficiency 08/09/2014   Insulin-requiring or dependent type II diabetes mellitus ([name]) 11/07/2013   Diabetes mellitus ([name]) 11/07/2013   Abnormal result on screening urine test 09/03/2006   Human immunodeficiency virus (HIV) infection ([name]) 06/04/2006   Herpes zoster 06/04/2006   Depression 06/04/2006   THROMBOPHLEBITIS NOS 06/04/2006   Gastroesophageal reflux disease 06/04/2006   ARTHRITIS, HAND 06/04/2006   Attention deficit hyperactivity disorder 06/04/2006   Past Medical History:  Diagnosis Date   ADHD (attention deficit hyperactivity disorder)    Anxiety    Chronic kidney disease    Clotting disorder ([name])    Depression    Diabetes mellitus without complication ([name])    Diabetes mellitus, type II ([name])    Dizziness 03/17/2015   Essential hypertension 06/25/2018   GERD (gastroesophageal reflux disease)    HIV disease ([name])    HIV infection ([name])    Liver disease    OSA (obstructive sleep apnea) 07/25/2015   Uses CPAP regularly   Peripheral vascular disease ([name])    Ulcer    Past Surgical History:  Procedure Laterality Date   AMPUTATION Right 10/02/2017   Procedure: RIGHT TRANSMETATARSAL AMPUTATION;  Surgeon: Tarry Kos, MD;  Location: [facility] OR;  Service: Orthopedics;  Laterality: Right;   SMALL  INTESTINE SURGERY     STOMACH SURGERY     TOE AMPUTATION Right 08/2016   right great toe   Allergies  Allergen Reactions   Aspirin Swelling   Efavirenz Swelling and Rash    Other reaction(s): anaphylaxis   Ibuprofen Swelling   Nsaids Other (See Comments)    unknwn   Pravastatin     myalgias   Lipitor [Atorvastatin Calcium] Other (See Comments)    Leg pain   Prior to Admission medications   Medication Sig Start Date End Date Taking? Authorizing Provider  acetaminophen (TYLENOL) 500 MG tablet Take 1,500 mg by mouth in the morning and at bedtime.   Yes [provider]  ALPRAZolam Prudy Feeler) 1 MG tablet Take 1 mg by mouth at bedtime. *May take one tablet up to 4 times daily as needed for anxiety   Yes [provider]  amphetamine-dextroamphetamine (ADDERALL) 30 MG tablet Take 30 mg by mouth 3 (three) times daily.   Yes [provider]  Continuous Glucose Sensor (FREESTYLE LIBRE 3 SENSOR) MISC 1 each by Does not apply route every 14 (fourteen) days. 09/08/22  Yes Carlus Pavlov, MD  dapagliflozin propanediol (FARXIGA) 10 MG TABS tablet Take 1 tablet (10 mg total) by mouth daily before breakfast. 03/19/22  Yes Carlus Pavlov, MD  Dextromethorphan-buPROPion ER (AUVELITY) 45-105 MG TBCR Take 1 tablet by mouth daily at 12 noon.   Yes [provider]  diclofenac Sodium (VOLTAREN) 1 % GEL APPLY 2 GM TO EACH KNEE EVERY MORNING AND EVERY NIGHT AT BEDTIME, AND APPLY 1 GM TO EACH KNEE EVERY AFTERNOON 06/24/22  Yes Shade Flood, MD  divalproex (DEPAKOTE ER) 500 MG 24 hr tablet Take 1 tablet (500 mg total) by mouth at bedtime. 06/24/22  Yes Shade Flood, MD  Dulaglutide (TRULICITY) 4.5 MG/0.5ML SOPN Inject 4.5 mg into the skin once a week. 09/22/22  Yes Carlus Pavlov, MD  Evolocumab (REPATHA SURECLICK) 140 MG/ML SOAJ Inject 140 mg into the skin every 14 (fourteen) days. 11/18/21  Yes Chilton Si, MD  ezetimibe (ZETIA) 10 MG tablet TAKE 1 TABLET(10 MG)  BY MOUTH DAILY 09/10/22  Yes Chilton Si, MD  finasteride (PROSCAR) 5 MG tablet Take 5 mg by mouth daily. 04/11/22  Yes [provider]  furosemide (LASIX) 40 MG tablet TAKE 1 TABLET(40 MG) BY MOUTH DAILY 12/19/21  Yes Shade Flood, MD  insulin glargine (LANTUS SOLOSTAR) 100 UNIT/ML Solostar Pen Inject 80-100 Units into the skin every morning. 09/08/22  Yes Carlus Pavlov, MD  insulin lispro (HUMALOG KWIKPEN) 100 UNIT/ML KwikPen Inject under skin 20 units with breakfast and 30 units with supper. 09/08/22  Yes Carlus Pavlov, MD  Insulin Pen Needle (B-D ULTRAFINE III SHORT PEN) 31G X 8 MM MISC USE AS DIRECTED THREE TIMES DAILY 05/28/22  Yes Carlus Pavlov, MD  levothyroxine (SYNTHROID, LEVOTHROID) 50 MCG tablet Take 1 tablet (50 mcg total) by mouth at bedtime. 07/24/15  Yes Collene Gobble, MD  pantoprazole (PROTONIX) 40 MG tablet TAKE 1 TABLET(40 MG) BY MOUTH DAILY 05/28/22  Yes Shade Flood, MD  pregabalin (LYRICA) 300 MG capsule TAKE 1 CAPSULE(300 MG) BY MOUTH TWICE DAILY 10/06/22  Yes Shade Flood, MD  rivaroxaban (XARELTO) 20 MG TABS tablet TAKE 1 TABLET(20 MG) BY MOUTH DAILY WITH SUPPER 06/24/22  Yes Shade Flood, MD  silodosin (RAPAFLO) 8 MG CAPS capsule Take 8 mg by mouth daily.   Yes [provider]  TRIUMEQ 600-50-300 MG tablet TAKE 1 TABLET BY MOUTH DAILY 05/12/22  Yes Comer, Belia Heman, MD  doxycycline (VIBRA-TABS) 100 MG tablet Take 1 tablet (100 mg total) by mouth 2 (two) times daily. Patient not taking: Reported on 10/08/2022 09/22/22   Shade Flood, MD  Lemborexant (DAYVIGO) 10 MG TABS Take 10 mg by mouth at bedtime. Patient not taking: Reported on 10/08/2022    [provider]   Social History   Socioeconomic History   Marital status: Single    Spouse name: Not on file   Number of children: Not on file   Years of education: Not on file   Highest education level: 12th grade  Occupational History    Comment: DISABILITY  Tobacco  Use   Smoking status: Former    Current packs/day: 0.00    Average packs/day: 0.1 packs/day for 10.0 years (1.0 ttl pk-yrs)    Types: Cigars, Cigarettes    Start date: 08/08/2004    Quit date: 08/09/2014    Years since quitting: 8.1   Smokeless tobacco: Never  Vaping Use   Vaping status: Never Used  Substance and Sexual Activity   Alcohol use: No    Alcohol/week: 0.0 standard drinks of alcohol   Drug use: No   Sexual activity: Not Currently    Partners: Male    Comment: pt.  declined condoms  Other Topics Concern   Not on file  Social History Narrative   Epworth Sleepiness Scale = 7 (as of 03/16/2015)   Social Determinants of Health   Financial Resource Strain: Low Risk  (09/20/2022)   Overall Financial Resource Strain (CARDIA)    Difficulty of Paying Living Expenses: Not hard at all  Food Insecurity: No Food Insecurity (09/20/2022)   Hunger Vital Sign    Worried About Running Out of Food in the Last Year: Never true    Ran Out of Food in the Last Year: Never true  Transportation Needs: No Transportation Needs (09/20/2022)   PRAPARE - Administrator, Civil Service (Medical): No    Lack of Transportation (Non-Medical): No  Physical Activity: Unknown (09/20/2022)   Exercise Vital Sign    Days of Exercise per Week: 0 days    Minutes of Exercise per Session: Not on file  Stress: No Stress Concern Present (09/20/2022)   [employer] of Occupational Health - Occupational Stress Questionnaire    Feeling of Stress : Only a little  Social Connections: Socially Isolated (09/20/2022)   Social Connection and Isolation Panel [NHANES]    Frequency of Communication with Friends and Family: Twice a week    Frequency of Social Gatherings with Friends and Family: Once a week    Attends Religious Services: Never    Database administrator or Organizations: No    Attends Engineer, structural: Not on file    Marital Status: Never married  Intimate Partner Violence: Not At  Risk (10/01/2017)   Humiliation, Afraid, Rape, and Kick questionnaire    Fear of Current or Ex-Partner: No    Emotionally Abused: No    Physically Abused: No    Sexually Abused: No    Observations/Objective: There were no vitals filed for this visit. Nontoxic appearance.  Speaking full sentences without respiratory distress.  Coherent responses, all questions answered with understanding plan expressed.  Assessment and Plan: Diabetic peripheral neuropathy ([name]) - Plan: Ambulatory referral to Dermatology, pregabalin (LYRICA) 300 MG capsule  Wound of left lower extremity, initial encounter - Plan: Ambulatory referral to Dermatology  Peripheral neuropathic pain - Plan: Ambulatory referral to Dermatology, pregabalin (LYRICA) 300 MG capsule  Lesion of left leg, possible initial abrasion but with persistent area of involvement, no change with antibiotic or 2 weeks time, and indented appearance, question underlying ulcer especially with his peripheral vascular disease, diabetes. He will send me a picture through MyChart as I am unable to evaluate over the video.  Option of in person evaluation, but if stable will refer to dermatology, urgent referral placed at this time.  RTC/urgent care/ER precautions given.  Reordered Lyrica given issue with most recent refill as above.  Sent to local pharmacy.  Follow Up Instructions: As needed, dermatology follow-up as above   I discussed the assessment and treatment plan with the patient. The patient was provided an opportunity to ask questions and all were answered. The patient agreed with the plan and demonstrated an understanding of the instructions.   The patient was advised to call back or seek an in-person evaluation if the symptoms worsen or if the condition fails to improve as anticipated.   Shade Flood, MD

## 2022-10-08 NOTE — Telephone Encounter (Signed)
See other message.  Reordered.

## 2022-10-08 NOTE — Patient Instructions (Signed)
Thanks for taking the video call today.  Try to send me a picture through the MyChart app, but if it has not improved at all with the antibiotic, there could be another skin condition including a possible ulcer underneath that area, especially if it is indented.  I think it should be seen by a dermatologist and will try to have you seen fairly quickly.  Keep area clean and covered if it does try to drain but be seen if any surrounding redness or worsening symptoms.  I resent the Lyrica to the local pharmacy.  Let me know if there are further concerns.

## 2022-10-08 NOTE — Telephone Encounter (Signed)
Pt states the medication Lyrica was returned

## 2022-10-08 NOTE — Telephone Encounter (Signed)
Pt states he never got his Lyrica . He states it was returned to the mail order pharmacy in [place] . He is asking if it can be sent to  [facility] in [place]  . He stated Thea Silversmith was helping him with this

## 2022-10-13 DIAGNOSIS — G4733 Obstructive sleep apnea (adult) (pediatric): Secondary | ICD-10-CM | POA: Diagnosis not present

## 2022-10-14 ENCOUNTER — Other Ambulatory Visit: Payer: Self-pay | Admitting: Family Medicine

## 2022-10-14 DIAGNOSIS — M792 Neuralgia and neuritis, unspecified: Secondary | ICD-10-CM

## 2022-10-14 DIAGNOSIS — E1142 Type 2 diabetes mellitus with diabetic polyneuropathy: Secondary | ICD-10-CM

## 2022-10-20 ENCOUNTER — Other Ambulatory Visit (HOSPITAL_BASED_OUTPATIENT_CLINIC_OR_DEPARTMENT_OTHER): Payer: Self-pay

## 2022-10-21 ENCOUNTER — Other Ambulatory Visit (HOSPITAL_BASED_OUTPATIENT_CLINIC_OR_DEPARTMENT_OTHER): Payer: Self-pay

## 2022-10-24 ENCOUNTER — Other Ambulatory Visit: Payer: Self-pay | Admitting: Cardiovascular Disease

## 2022-10-24 DIAGNOSIS — E785 Hyperlipidemia, unspecified: Secondary | ICD-10-CM

## 2022-10-24 DIAGNOSIS — I70209 Unspecified atherosclerosis of native arteries of extremities, unspecified extremity: Secondary | ICD-10-CM

## 2022-11-04 ENCOUNTER — Telehealth: Payer: Self-pay

## 2022-11-04 ENCOUNTER — Ambulatory Visit: Payer: Self-pay

## 2022-11-04 NOTE — Patient Instructions (Signed)
Visit Information  Thank you for taking time to visit with me today. Please don't hesitate to contact me if I can be of assistance to you.   Following are the goals we discussed today:   Goals Addressed             This Visit's Progress    Diabetes Management       Patient Goals/Self Care Activities: -Patient/Caregiver will take medications as prescribed   -Patient/Caregiver will attend all scheduled provider appointments  -check blood sugar at prescribed times -check blood sugar if I feel it is too high or too low -record values and write them down take them to all doctor visits    Patient reports doing okay.  Blood sugars below 150.  Discussed diabetes management.  No concerns.            Our next appointment is by telephone on 02/03/23 at 1:00 pm  Please call the care guide team at [phone] if you need to cancel or reschedule your appointment.   If you are experiencing a Mental Health or Behavioral Health Crisis or need someone to talk to, please call the Suicide and Crisis Lifeline: 988   Patient verbalizes understanding of instructions and care plan provided today and agrees to view in MyChart. Active MyChart status and patient understanding of how to access instructions and care plan via MyChart confirmed with patient.     The patient has been provided with contact information for the care management team and has been advised to call with any health related questions or concerns.   Bary Leriche, RN, MSN [facility], [facility] Management Community Coordinator Direct Dial: [phone]  Fax: [phone] Website: [email or web address]

## 2022-11-04 NOTE — Patient Outreach (Signed)
  Care Coordination   Follow Up Visit Note   11/04/2022 Name: DRACE GATER MRN: [id number] DOB: 04/10/1955  ZOEL BASSANI is a 67 y.o. year old male who sees Shade Flood, MD for primary care. I spoke with  Emeterio Reeve by phone today.  What matters to the patients health and wellness today?  Maintain health    Goals Addressed             This Visit's Progress    Diabetes Management       Patient Goals/Self Care Activities: -Patient/Caregiver will take medications as prescribed   -Patient/Caregiver will attend all scheduled provider appointments  -check blood sugar at prescribed times -check blood sugar if I feel it is too high or too low -record values and write them down take them to all doctor visits    Patient reports doing okay.  Blood sugars below 150.  Discussed diabetes management.  No concerns.            SDOH assessments and interventions completed:  Yes     Care Coordination Interventions:  Yes, provided   Follow up plan: Follow up call scheduled for December    Encounter Outcome:  Patient Visit Completed   Bary Leriche, RN, MSN [place]  [facility], [facility] Management Community Coordinator Direct Dial: [phone]  Fax: [phone] Website: [email or web address]

## 2022-11-04 NOTE — Patient Outreach (Signed)
  Care Coordination   11/04/2022 Name: Daniel [name] MRN: [id number] DOB: [birth year]   Care Coordination Outreach Attempts:  An unsuccessful telephone outreach was attempted today to offer the patient information about available care coordination services.  Follow Up Plan:  Additional outreach attempts will be made to offer the patient care coordination information and services.   Encounter Outcome:  No Answer   Care Coordination Interventions:  No, not indicated    Bary Leriche, RN, MSN [facility]  [facility], [facility] Management Community Coordinator Direct Dial: [phone]  Fax: [phone] Website: [email or web address]

## 2022-11-11 ENCOUNTER — Other Ambulatory Visit: Payer: Self-pay | Admitting: Family Medicine

## 2022-11-11 DIAGNOSIS — M792 Neuralgia and neuritis, unspecified: Secondary | ICD-10-CM

## 2022-11-11 DIAGNOSIS — E1142 Type 2 diabetes mellitus with diabetic polyneuropathy: Secondary | ICD-10-CM

## 2022-11-11 NOTE — Telephone Encounter (Signed)
Recent visit in July and August, briefly discussed peripheral neuropathy at that time, medications discussed in more detail in April.  Controlled substance database reviewed.  #60 filled on 10/09/2022, refill ordered.

## 2022-11-11 NOTE — Telephone Encounter (Signed)
Patient is requesting a refill of the following medications: Requested Prescriptions   Pending Prescriptions Disp Refills   pregabalin (LYRICA) 300 MG capsule [Pharmacy Med Name: PREGABALIN 300MG  CAPSULES] 60 capsule     Sig: TAKE 1 CAPSULE(300 MG) BY MOUTH TWICE DAILY    Date of patient request: 11/11/22 Last office visit: 10/08/22 Date of last refill: 10/08/22 Last refill amount: 60 Follow up time period per chart: 12/24/22

## 2022-11-16 ENCOUNTER — Other Ambulatory Visit: Payer: Self-pay | Admitting: Cardiovascular Disease

## 2022-11-16 DIAGNOSIS — E785 Hyperlipidemia, unspecified: Secondary | ICD-10-CM

## 2022-11-16 DIAGNOSIS — I70209 Unspecified atherosclerosis of native arteries of extremities, unspecified extremity: Secondary | ICD-10-CM

## 2022-11-21 ENCOUNTER — Other Ambulatory Visit (HOSPITAL_BASED_OUTPATIENT_CLINIC_OR_DEPARTMENT_OTHER): Payer: Self-pay

## 2022-12-03 DIAGNOSIS — I83009 Varicose veins of unspecified lower extremity with ulcer of unspecified site: Secondary | ICD-10-CM | POA: Diagnosis not present

## 2022-12-08 ENCOUNTER — Ambulatory Visit: Payer: PPO | Admitting: Internal Medicine

## 2022-12-08 NOTE — Progress Notes (Deleted)
Patient ID: Daniel [name], male   DOB: [birth year], 67 y.o.   MRN: [id number]  HPI: Daniel [name] is a 67 y.o.-year-old male, returning for follow-up for DM2, dx in 2015, insulin-dependent since 2016, uncontrolled, with complications (PAD, foot ulcer, status post right transmetatarsal amputation, stage IIIa CKD, PN). Pt. previously saw Dr. Everardo [name], but last visit with me 4 months ago -virtual.  Interim history: He has increased urination, no blurry vision, chest pain. He has nausea and reflux. He has dry and watery eyes -he feels that this is related to the CPAP machine.  Reviewed HbA1c: Lab Results  Component Value Date   HGBA1C 6.6 (A) 05/08/2022   HGBA1C 6.4 (A) 01/21/2022   HGBA1C 6.6 (A) 09/19/2021   HGBA1C 7.0 (A) 04/11/2021   HGBA1C 7.2 (A) 01/09/2021   HGBA1C 7.5 (A) 10/08/2020   HGBA1C 6.3 (A) 05/07/2020   HGBA1C 6.2 (A) 08/24/2019   HGBA1C 7.2 (A) 05/11/2019   HGBA1C 7.5 (A) 02/01/2019   Pt is on a regimen of: - Farxiga 10 mg before b'fast - Trulicity 4.5 mg weekly -on backorder - Lantus 100 >> 80 >> 100 units  in a.m.  - Novolog 15-20 >> 20 units in a.m. and 25-30 >> 30 units before dinner  He is on a CGM:  Previously:  Prev.:   Lowest sugar was 50s (took insulin and did not eat) >> 54 >> 55 >> 70; he has hypoglycemia awareness <60.  Highest sugar was 400s >> 300 >> 314 >> 200.  He drinks 4-5 gallons of 2% milk a week >> trying to cut down.  He was previously drinking whole milk. He sees nutrition. He eats at night, eats oatmeal + brown sugar.  - + CKD, last BUN/creatinine:  Lab Results  Component Value Date   BUN 23 04/14/2022   BUN 32 (H) 12/19/2021   CREATININE 1.53 (H) 04/14/2022   CREATININE 1.86 (H) 12/19/2021   Lab Results  Component Value Date   MICRALBCREAT 14.1 12/19/2021   MICRALBCREAT 30.2 (H) 12/05/2020   MICRALBCREAT 69 (H) 08/25/2019  He is not on an ACE inhibitor/ARB.  -+ HL; last set of lipids: Lab Results  Component Value Date    CHOL 96 04/14/2022   HDL 47 04/14/2022   LDLCALC 11 04/14/2022   TRIG 359 (H) 04/14/2022   CHOLHDL 2.0 04/14/2022  He is on Zetia 10 mg daily, Repatha.  He is intolerant to statins due to myalgias.   He does have a history of NASH.  - last eye exam was in 05/29/2022. No DR. Dr. Nile [name].  - + numbness and tingling in his feet.  Last foot exam was 12/19/2021.  He is on Lyrica 300 mg twice a day.  He does not have a hypothyroidism - this is Rx'ed by his psychiatrist - Dr. Evelene [name]. Pt is on levothyroxine 50 mcg daily, taken: - in am - fasting - 15-20 min from b'fast  Reviewed his latest TSH levels: Lab Results  Component Value Date   TSH 0.47 05/07/2020   TSH 0.108 (L) 09/14/2019   TSH 0.736 09/17/2016   TSH 0.19 (L) 07/11/2015   TSH 1.067 06/21/2013   TSH 2.762 01/01/2007   He also has a history of HTN, liver disease, OSA, HIV, ADHD, on chronic opioids.  ROS: + see HPI  Past Medical History:  Diagnosis Date   ADHD (attention deficit hyperactivity disorder)    Anxiety    Chronic kidney disease    Clotting disorder ([name])  Depression    Diabetes mellitus without complication ([name])    Diabetes mellitus, type II ([name])    Dizziness 03/17/2015   Essential hypertension 06/25/2018   GERD (gastroesophageal reflux disease)    HIV disease ([name])    HIV infection ([name])    Liver disease    OSA (obstructive sleep apnea) 07/25/2015   Uses CPAP regularly   Peripheral vascular disease ([name])    Ulcer    Past Surgical History:  Procedure Laterality Date   AMPUTATION Right 10/02/2017   Procedure: RIGHT TRANSMETATARSAL AMPUTATION;  Surgeon: Daniel Kos, MD;  Location: [facility] OR;  Service: Orthopedics;  Laterality: Right;   SMALL INTESTINE SURGERY     STOMACH SURGERY     TOE AMPUTATION Right 08/2016   right great toe   Social History   Socioeconomic History   Marital status: Single    Spouse name: Not on file   Number of children: Not on file   Years of education: Not on file    Highest education level: 12th grade  Occupational History    Comment: DISABILITY  Tobacco Use   Smoking status: Former    Current packs/day: 0.00    Average packs/day: 0.1 packs/day for 10.0 years (1.0 ttl pk-yrs)    Types: Cigars, Cigarettes    Start date: 08/08/2004    Quit date: 08/09/2014    Years since quitting: 8.3   Smokeless tobacco: Never  Vaping Use   Vaping status: Never Used  Substance and Sexual Activity   Alcohol use: No    Alcohol/week: 0.0 standard drinks of alcohol   Drug use: No   Sexual activity: Not Currently    Partners: Male    Comment: pt. declined condoms  Other Topics Concern   Not on file  Social History Narrative   Epworth Sleepiness Scale = 7 (as of 03/16/2015)   Social Determinants of Health   Financial Resource Strain: Low Risk  (09/20/2022)   Overall Financial Resource Strain (CARDIA)    Difficulty of Paying Living Expenses: Not hard at [name]  Food Insecurity: No Food Insecurity (09/20/2022)   Hunger Vital Sign    Worried About Running Out of Food in the Last Year: Never true    Ran Out of Food in the Last Year: Never true  Transportation Needs: No Transportation Needs (09/20/2022)   PRAPARE - Administrator, Civil Service (Medical): No    Lack of Transportation (Non-Medical): No  Physical Activity: Unknown (09/20/2022)   Exercise Vital Sign    Days of Exercise per Week: 0 days    Minutes of Exercise per Session: Not on file  Stress: No Stress Concern Present (09/20/2022)   [employer] of Occupational Health - Occupational Stress Questionnaire    Feeling of Stress : Only a little  Social Connections: Socially Isolated (09/20/2022)   Social Connection and Isolation Panel [NHANES]    Frequency of Communication with Friends and Family: Twice a week    Frequency of Social Gatherings with Friends and Family: Once a week    Attends Religious Services: Never    Database administrator or Organizations: No    Attends Hospital doctor: Not on file    Marital Status: Never married  Intimate Partner Violence: Not At Risk (10/01/2017)   Humiliation, Afraid, Rape, and Kick questionnaire    Fear of Current or Ex-Partner: No    Emotionally Abused: No    Physically Abused: No    Sexually Abused: No  Current Outpatient Medications on File Prior to Visit  Medication Sig Dispense Refill   acetaminophen (TYLENOL) 500 MG tablet Take 1,500 mg by mouth in the morning and at bedtime.     ALPRAZolam (XANAX) 1 MG tablet Take 1 mg by mouth at bedtime. *May take one tablet up to 4 times daily as needed for anxiety     amphetamine-dextroamphetamine (ADDERALL) 30 MG tablet Take 30 mg by mouth 3 (three) times daily.     Continuous Glucose Sensor (FREESTYLE LIBRE 3 SENSOR) MISC 1 each by Does not apply route every 14 (fourteen) days.     dapagliflozin propanediol (FARXIGA) 10 MG TABS tablet Take 1 tablet (10 mg total) by mouth daily before breakfast. 90 tablet 3   Dextromethorphan-buPROPion ER (AUVELITY) 45-105 MG TBCR Take 1 tablet by mouth daily at 12 noon.     diclofenac Sodium (VOLTAREN) 1 % GEL APPLY 2 GM TO EACH KNEE EVERY MORNING AND EVERY NIGHT AT BEDTIME, AND APPLY 1 GM TO EACH KNEE EVERY AFTERNOON 300 g 2   divalproex (DEPAKOTE ER) 500 MG 24 hr tablet Take 1 tablet (500 mg total) by mouth at bedtime. 90 tablet 1   Dulaglutide (TRULICITY) 4.5 MG/0.5ML SOPN Inject 4.5 mg into the skin once a week. 6 mL 2   ezetimibe (ZETIA) 10 MG tablet TAKE 1 TABLET(10 MG) BY MOUTH DAILY 90 tablet 0   finasteride (PROSCAR) 5 MG tablet Take 5 mg by mouth daily.     furosemide (LASIX) 40 MG tablet TAKE 1 TABLET(40 MG) BY MOUTH DAILY 90 tablet 3   insulin glargine (LANTUS SOLOSTAR) 100 UNIT/ML Solostar Pen Inject 80-100 Units into the skin every morning. 105 mL 3   insulin lispro (HUMALOG KWIKPEN) 100 UNIT/ML KwikPen Inject under skin 20 units with breakfast and 30 units with supper.     Insulin Pen Needle (B-D ULTRAFINE III SHORT PEN) 31G X 8 MM  MISC USE AS DIRECTED THREE TIMES DAILY 100 each 5   Lemborexant (DAYVIGO) 10 MG TABS Take 10 mg by mouth at bedtime. (Patient not taking: Reported on 10/08/2022)     levothyroxine (SYNTHROID, LEVOTHROID) 50 MCG tablet Take 1 tablet (50 mcg total) by mouth at bedtime. 30 tablet 11   pantoprazole (PROTONIX) 40 MG tablet TAKE 1 TABLET(40 MG) BY MOUTH DAILY 90 tablet 1   pregabalin (LYRICA) 300 MG capsule TAKE 1 CAPSULE(300 MG) BY MOUTH TWICE DAILY 60 capsule 0   REPATHA SURECLICK 140 MG/ML SOAJ ADMINISTER 1 ML UNDER THE SKIN EVERY 14 DAYS 2 mL 11   rivaroxaban (XARELTO) 20 MG TABS tablet TAKE 1 TABLET(20 MG) BY MOUTH DAILY WITH SUPPER 90 tablet 2   silodosin (RAPAFLO) 8 MG CAPS capsule Take 8 mg by mouth daily.     TRIUMEQ 600-50-300 MG tablet TAKE 1 TABLET BY MOUTH DAILY 30 tablet 6   No current facility-administered medications on file prior to visit.   Allergies  Allergen Reactions   Aspirin Swelling   Efavirenz Swelling and Rash    Other reaction(s): anaphylaxis   Ibuprofen Swelling   Nsaids Other (See Comments)    unknwn   Pravastatin     myalgias   Lipitor [Atorvastatin Calcium] Other (See Comments)    Leg pain   Family History  Problem Relation Age of Onset   Depression Brother    Throat cancer Brother        half brother, never smoker   COPD Mother    Diabetes Neg Hx    PE: There  were no vitals taken for this visit. Wt Readings from Last 3 Encounters:  09/22/22 256 lb 6 oz (116.3 kg)  06/23/22 254 lb 3.2 oz (115.3 kg)  05/14/22 250 lb (113.4 kg)   Constitutional: overweight, in NAD Eyes:  EOMI, no exophthalmos ENT: no neck masses, no cervical lymphadenopathy Cardiovascular: RRR, No MRG Respiratory: CTA B Musculoskeletal: no deformities Skin:no rashes Neurological: no tremor with outstretched hands  ASSESSMENT: 1. DM2, insulin-dependent, uncontrolled, with complications - PAD - foot ulcer, status post right transmetatarsal amputation - stage IIIa CKD -  PN  2.  Hyperlipidemia  3.  Acquired hypothyroidism  PLAN:  1. Patient with longstanding, fairly well-controlled type 2 diabetes, on oral antidiabetic regimen with SGLT2 inhibitor, also basal-bolus insulin regimen and weekly GLP-1 receptor agonist.  Our last visit was virtual so we could not check an HbA1c but the latest HbA1c available for review is from 7 months ago and this was 6.6%, at goal.  Sugars were fluctuating mostly within the target range with only occasional hyperglycemic spikes but not frequently.  He previously increased the Lantus back to 100 units in the morning, which we discussed is a very high dose, but since he had no lows, we could continue it.  He had some nausea but he did not feel that this was due to Trulicity.  We discussed at that time about trying to stop eating at night but if he had a snack, to cover it with few units of NovoLog. CGM interpretation: -At today's visit, we reviewed his CGM downloads: It appears that *** of values are in target range (goal >70%), while *** are higher than 180 (goal <25%), and *** are lower than 70 (goal <4%).  The calculated average blood sugar is ***.  The projected HbA1c for the next 3 months (GMI) is ***. -Reviewing the CGM trends, ***  -I advised him to: Patient Instructions  Please continue: - Farxiga 10 mg before b'fast - Trulicity 4.5 mg weekly - Lantus 100 units  in a.m. - Novolog 15 minutes before meals: 20 units in a.m. and 30 units before dinner  Try to stop eating at night, but if you have a snack, you may need to cover it with Novolog 5-8 units.  Please return in 3-4 months.  - we checked his HbA1c: 7%  - advised to check sugars at different times of the day - 4x a day, rotating check times - advised for yearly eye exams >> he is UTD - return to clinic in 3-4 months  2. HL and 4.  Drug (statin)-induced myopathy -Latest lipid panel from 03/2022 showed a very low LDL and high triglycerides: Lab Results  Component  Value Date   CHOL 96 04/14/2022   HDL 47 04/14/2022   LDLCALC 11 04/14/2022   TRIG 359 (H) 04/14/2022   CHOLHDL 2.0 04/14/2022  -He is on Zetia 10 mg daily and Repatha without side effects.  He could not tolerate statins due to myalgias.  3.  Thyroid hormone use -He was started on thyroid hormones by his psychiatrist, without a diagnosis of hypothyroidism, apparently for symptom control -Latest TSH available for review was normal: Lab Results  Component Value Date   TSH 0.47 05/07/2020  -He continues on levothyroxine 50 mcg daily -Management per psychiatry  Carlus Pavlov, MD PhD [facility]

## 2022-12-12 ENCOUNTER — Encounter: Payer: Self-pay | Admitting: Internal Medicine

## 2022-12-12 ENCOUNTER — Other Ambulatory Visit: Payer: Self-pay | Admitting: Family Medicine

## 2022-12-12 ENCOUNTER — Ambulatory Visit: Payer: PPO | Admitting: Internal Medicine

## 2022-12-12 VITALS — BP 132/80 | HR 83 | Resp 20 | Ht 70.0 in | Wt 252.4 lb

## 2022-12-12 DIAGNOSIS — N1831 Chronic kidney disease, stage 3a: Secondary | ICD-10-CM | POA: Diagnosis not present

## 2022-12-12 DIAGNOSIS — E1142 Type 2 diabetes mellitus with diabetic polyneuropathy: Secondary | ICD-10-CM

## 2022-12-12 DIAGNOSIS — Z794 Long term (current) use of insulin: Secondary | ICD-10-CM | POA: Diagnosis not present

## 2022-12-12 DIAGNOSIS — E1122 Type 2 diabetes mellitus with diabetic chronic kidney disease: Secondary | ICD-10-CM | POA: Diagnosis not present

## 2022-12-12 DIAGNOSIS — G720 Drug-induced myopathy: Secondary | ICD-10-CM | POA: Diagnosis not present

## 2022-12-12 DIAGNOSIS — M792 Neuralgia and neuritis, unspecified: Secondary | ICD-10-CM

## 2022-12-12 DIAGNOSIS — E782 Mixed hyperlipidemia: Secondary | ICD-10-CM

## 2022-12-12 LAB — HEMOGLOBIN A1C: Hemoglobin A1C: 5.8

## 2022-12-12 MED ORDER — FREESTYLE LIBRE 3 SENSOR MISC: 1.0000 | 3 refills | Status: DC

## 2022-12-12 NOTE — Patient Instructions (Addendum)
Please continue: - Farxiga 10 mg before b'fast - Trulicity 4.5 mg weekly  Decrease: - Lantus to 80 units daily - Novolog 15 minutes before meals: 15 units in a.m. and 25 units before dinner  Try to stop eating at night, but if you have a snack, you may need to cover it with Novolog 5-8 units.  Please return in 4 months.

## 2022-12-12 NOTE — Telephone Encounter (Signed)
Controlled substance database reviewed.  Pregabalin last filled for #[id number] on 11/11/2022.  Video visit discussing his peripheral neuropathy on August 14.  Refill ordered.

## 2022-12-12 NOTE — Telephone Encounter (Signed)
Patient is requesting a refill of the following medications: Requested Prescriptions   Pending Prescriptions Disp Refills   pregabalin (LYRICA) 300 MG capsule [Pharmacy Med Name: PREGABALIN 300MG  CAPSULES] 60 capsule     Sig: TAKE 1 CAPSULE(300 MG) BY MOUTH TWICE DAILY    Date of patient request: 12/12/22 Last office visit: 10/08/22 Date of last refill: 11/11/22 Last refill amount: 60 Follow up time period per chart: PRN

## 2022-12-12 NOTE — Progress Notes (Signed)
Patient ID: Daniel [name], male   DOB: 1955-06-21, 67 y.o.   MRN: [id number]  HPI: Daniel [name] is a 67 y.o.-year-old male, returning for follow-up for DM2, dx in 2015, insulin-dependent since 2016, uncontrolled, with complications (PAD, foot ulcer, status post right transmetatarsal amputation, stage IIIa CKD, PN). Pt. previously saw Dr. Everardo All, but last visit with me 4 months ago -virtual.  Interim history: He has increased urination, no blurry vision, chest pain.  He continues to have nausea and reflux but 2 months ago he also started to have dysphagia.  He was referred to GI. He has dry and watery eyes -he feels that this is related to the CPAP machine. He is on Doxycycline and Benzoyl peroxide  - for a L shin ulcer lesion - per dermatology. Feels that this is drying up. Will f/u with them in 4 weeks.  He also has a Risk analyst in [place].  Reviewed HbA1c: Lab Results  Component Value Date   HGBA1C 6.6 (A) 05/08/2022   HGBA1C 6.4 (A) 01/21/2022   HGBA1C 6.6 (A) 09/19/2021   HGBA1C 7.0 (A) 04/11/2021   HGBA1C 7.2 (A) 01/09/2021   HGBA1C 7.5 (A) 10/08/2020   HGBA1C 6.3 (A) 05/07/2020   HGBA1C 6.2 (A) 08/24/2019   HGBA1C 7.2 (A) 05/11/2019   HGBA1C 7.5 (A) 02/01/2019   Pt is on a regimen of: - Farxiga 10 mg before b'fast - Trulicity 4.5 mg weekly  - Lantus 100 >> 80 >> 100 units  in a.m.  - Novolog 15-20 >> 20 units in a.m. and 25-30 >> 30 units before dinner  He is on a CGM:  Previously:  Prev.:   Lowest sugar was 50s (took insulin and did not eat) >> 54 >> 55 >> 70 >> 50s; he has hypoglycemia awareness <60.  Highest sugar was 400s >> 300 >> 314 >> 200 >> 200s.  He drinks 4-5 gallons of 2% milk a week >> trying to cut down.  He was previously drinking whole milk. He sees nutrition. He eats at night, eats oatmeal + brown sugar.  - + CKD, last BUN/creatinine:  Lab Results  Component Value Date   BUN 23 04/14/2022   BUN 32 (H) 12/19/2021   CREATININE 1.53 (H)  04/14/2022   CREATININE 1.86 (H) 12/19/2021   Lab Results  Component Value Date   MICRALBCREAT 14.1 12/19/2021   MICRALBCREAT 30.2 (H) 12/05/2020   MICRALBCREAT 69 (H) 08/25/2019  He is not on an ACE inhibitor/ARB.  -+ HL; last set of lipids: Lab Results  Component Value Date   CHOL 96 04/14/2022   HDL 47 04/14/2022   LDLCALC 11 04/14/2022   TRIG 359 (H) 04/14/2022   CHOLHDL 2.0 04/14/2022  He is on Zetia 10 mg daily, Repatha.  He is intolerant to statins due to myalgias.   He does have a history of NASH.  - last eye exam was in 05/29/2022. No DR. Dr. Nile Riggs.  - + numbness and tingling in his feet.  Last foot exam was 12/19/2021.  He is on Lyrica 300 mg twice a day. Sees Dr. Ulice Brilliant - podiatrist in [place].  He does not have a hypothyroidism - this is Rx'ed by his psychiatrist - Dr. [name]. Pt is on levothyroxine 50 mcg daily, taken: - in am - fasting - 15-20 min from b'fast  Reviewed his latest TSH levels: Lab Results  Component Value Date   TSH 0.47 05/07/2020   TSH 0.108 (L) 09/14/2019   TSH 0.736 09/17/2016  TSH 0.19 (L) 07/11/2015   TSH 1.067 06/21/2013   TSH 2.762 01/01/2007   He also has a history of HTN, liver disease, OSA, HIV, ADHD, on chronic opioids.  ROS: + see HPI  Past Medical History:  Diagnosis Date   ADHD (attention deficit hyperactivity disorder)    Anxiety    Chronic kidney disease    Clotting disorder ([name])    Depression    Diabetes mellitus without complication ([name])    Diabetes mellitus, type II ([name])    Dizziness 03/17/2015   Essential hypertension 06/25/2018   GERD (gastroesophageal reflux disease)    HIV disease ([name])    HIV infection ([name])    Liver disease    OSA (obstructive sleep apnea) 07/25/2015   Uses CPAP regularly   Peripheral vascular disease ([name])    Ulcer    Past Surgical History:  Procedure Laterality Date   AMPUTATION Right 10/02/2017   Procedure: RIGHT TRANSMETATARSAL AMPUTATION;  Surgeon: Tarry Kos, MD;   Location: [facility] OR;  Service: Orthopedics;  Laterality: Right;   SMALL INTESTINE SURGERY     STOMACH SURGERY     TOE AMPUTATION Right 08/2016   right great toe   Social History   Socioeconomic History   Marital status: Single    Spouse name: Not on file   Number of children: Not on file   Years of education: Not on file   Highest education level: 12th grade  Occupational History    Comment: DISABILITY  Tobacco Use   Smoking status: Former    Current packs/day: 0.00    Average packs/day: 0.1 packs/day for 10.0 years (1.0 ttl pk-yrs)    Types: Cigars, Cigarettes    Start date: 08/08/2004    Quit date: 08/09/2014    Years since quitting: 8.3   Smokeless tobacco: Never  Vaping Use   Vaping status: Never Used  Substance and Sexual Activity   Alcohol use: No    Alcohol/week: 0.0 standard drinks of alcohol   Drug use: No   Sexual activity: Not Currently    Partners: Male    Comment: pt. declined condoms  Other Topics Concern   Not on file  Social History Narrative   Epworth Sleepiness Scale = 7 (as of 03/16/2015)   Social Determinants of Health   Financial Resource Strain: Low Risk  (09/20/2022)   Overall Financial Resource Strain (CARDIA)    Difficulty of Paying Living Expenses: Not hard at all  Food Insecurity: No Food Insecurity (09/20/2022)   Hunger Vital Sign    Worried About Running Out of Food in the Last Year: Never true    Ran Out of Food in the Last Year: Never true  Transportation Needs: No Transportation Needs (09/20/2022)   PRAPARE - Administrator, Civil Service (Medical): No    Lack of Transportation (Non-Medical): No  Physical Activity: Unknown (09/20/2022)   Exercise Vital Sign    Days of Exercise per Week: 0 days    Minutes of Exercise per Session: Not on file  Stress: No Stress Concern Present (09/20/2022)   [employer] of Occupational Health - Occupational Stress Questionnaire    Feeling of Stress : Only a little  Social Connections:  Socially Isolated (09/20/2022)   Social Connection and Isolation Panel [NHANES]    Frequency of Communication with Friends and Family: Twice a week    Frequency of Social Gatherings with Friends and Family: Once a week    Attends Religious Services: Never  Active Member of Clubs or Organizations: No    Attends Banker Meetings: Not on file    Marital Status: Never married  Intimate Partner Violence: Not At Risk (10/01/2017)   Humiliation, Afraid, Rape, and Kick questionnaire    Fear of Current or Ex-Partner: No    Emotionally Abused: No    Physically Abused: No    Sexually Abused: No   Current Outpatient Medications on File Prior to Visit  Medication Sig Dispense Refill   acetaminophen (TYLENOL) 500 MG tablet Take 1,500 mg by mouth in the morning and at bedtime.     ALPRAZolam (XANAX) 1 MG tablet Take 1 mg by mouth at bedtime. *May take one tablet up to 4 times daily as needed for anxiety     amphetamine-dextroamphetamine (ADDERALL) 30 MG tablet Take 30 mg by mouth 3 (three) times daily.     Continuous Glucose Sensor (FREESTYLE LIBRE 3 SENSOR) MISC 1 each by Does not apply route every 14 (fourteen) days.     dapagliflozin propanediol (FARXIGA) 10 MG TABS tablet Take 1 tablet (10 mg total) by mouth daily before breakfast. 90 tablet 3   Dextromethorphan-buPROPion ER (AUVELITY) 45-105 MG TBCR Take 1 tablet by mouth daily at 12 noon.     diclofenac Sodium (VOLTAREN) 1 % GEL APPLY 2 GM TO EACH KNEE EVERY MORNING AND EVERY NIGHT AT BEDTIME, AND APPLY 1 GM TO EACH KNEE EVERY AFTERNOON 300 g 2   divalproex (DEPAKOTE ER) 500 MG 24 hr tablet Take 1 tablet (500 mg total) by mouth at bedtime. 90 tablet 1   Dulaglutide (TRULICITY) 4.5 MG/0.5ML SOPN Inject 4.5 mg into the skin once a week. 6 mL 2   ezetimibe (ZETIA) 10 MG tablet TAKE 1 TABLET(10 MG) BY MOUTH DAILY 90 tablet 0   finasteride (PROSCAR) 5 MG tablet Take 5 mg by mouth daily.     furosemide (LASIX) 40 MG tablet TAKE 1 TABLET(40  MG) BY MOUTH DAILY 90 tablet 3   insulin glargine (LANTUS SOLOSTAR) 100 UNIT/ML Solostar Pen Inject 80-100 Units into the skin every morning. 105 mL 3   insulin lispro (HUMALOG KWIKPEN) 100 UNIT/ML KwikPen Inject under skin 20 units with breakfast and 30 units with supper.     Insulin Pen Needle (B-D ULTRAFINE III SHORT PEN) 31G X 8 MM MISC USE AS DIRECTED THREE TIMES DAILY 100 each 5   Lemborexant (DAYVIGO) 10 MG TABS Take 10 mg by mouth at bedtime. (Patient not taking: Reported on 10/08/2022)     levothyroxine (SYNTHROID, LEVOTHROID) 50 MCG tablet Take 1 tablet (50 mcg total) by mouth at bedtime. 30 tablet 11   pantoprazole (PROTONIX) 40 MG tablet TAKE 1 TABLET(40 MG) BY MOUTH DAILY 90 tablet 1   pregabalin (LYRICA) 300 MG capsule TAKE 1 CAPSULE(300 MG) BY MOUTH TWICE DAILY 60 capsule 0   REPATHA SURECLICK 140 MG/ML SOAJ ADMINISTER 1 ML UNDER THE SKIN EVERY 14 DAYS 2 mL 11   rivaroxaban (XARELTO) 20 MG TABS tablet TAKE 1 TABLET(20 MG) BY MOUTH DAILY WITH SUPPER 90 tablet 2   silodosin (RAPAFLO) 8 MG CAPS capsule Take 8 mg by mouth daily.     TRIUMEQ 600-50-300 MG tablet TAKE 1 TABLET BY MOUTH DAILY 30 tablet 6   No current facility-administered medications on file prior to visit.   Allergies  Allergen Reactions   Aspirin Swelling   Efavirenz Swelling and Rash    Other reaction(s): anaphylaxis   Ibuprofen Swelling   Nsaids Other (See Comments)  unknwn   Pravastatin     myalgias   Lipitor [Atorvastatin Calcium] Other (See Comments)    Leg pain   Family History  Problem Relation Age of Onset   Depression Brother    Throat cancer Brother        half brother, never smoker   COPD Mother    Diabetes Neg Hx    PE: BP 132/80 (BP Location: Left Arm, Patient Position: Sitting, Cuff Size: Large)   Pulse 83   Resp 20   Ht 5\' 10"  (1.778 m)   Wt 252 lb 6.4 oz (114.5 kg)   SpO2 96%   BMI 36.22 kg/m  Wt Readings from Last 3 Encounters:  12/12/22 252 lb 6.4 oz (114.5 kg)  09/22/22 256  lb 6 oz (116.3 kg)  06/23/22 254 lb 3.2 oz (115.3 kg)   Constitutional: overweight, in NAD Eyes:  EOMI, no exophthalmos ENT: no neck masses, no cervical lymphadenopathy Cardiovascular: RRR, No MRG Respiratory: CTA B Musculoskeletal: + deformities - R TMA Skin:no rashes, + erythema and 1 cm eschar on the left shin, very slightly weeping.  No fluctuance or purulence.  Patient has compression hoses on and the eschar is well covered with bandage. Neurological: + Resting tremor Diabetic Foot Exam - Simple   Simple Foot Form Diabetic Foot exam was performed with the following findings: Yes 12/12/2022  4:20 PM  Visual Inspection See comments: Yes Sensation Testing See comments: Yes Pulse Check Posterior Tibialis and Dorsalis pulse intact bilaterally: Yes Comments R TMA No sensation to monofilament in R foot and only mild sensation on medial L foot    ASSESSMENT: 1. DM2, insulin-dependent, uncontrolled, with complications - PAD - foot ulcer, status post right transmetatarsal amputation - stage IIIa CKD - PN  2.  Hyperlipidemia  3.  Acquired hypothyroidism  PLAN:  1. Patient with longstanding, fairly well-controlled type 2 diabetes, on oral antidiabetic regimen with SGLT2 inhibitor, also basal-bolus insulin regimen and weekly GLP-1 receptor agonist.  Our last visit was virtual so we could not check an HbA1c but the latest HbA1c available for review is from 7 months ago and this was 6.6%, at goal.  Sugars were fluctuating mostly within the target range with only occasional hyperglycemic spikes but not frequently.  He previously increased the Lantus back to 100 units in the morning, which we discussed is a very high dose, but since he had no lows, we could continue it.  He had some nausea but he did not feel that this was due to Trulicity.  We discussed at that time about trying to stop eating at night but if he had a snack, to cover it with few units of NovoLog. CGM interpretation: -At  today's visit, we reviewed his CGM downloads: It appears that 88% of values are in target range (goal >70%), while 6% are higher than 180 (goal <25%), and 6% are lower than 70 (goal <4%).  The calculated average blood sugar is 122.  The projected HbA1c for the next 3 months (GMI) is 6.2%. -Reviewing the CGM trends, sugars appear to be mostly fluctuating within the target range.  He has higher blood sugars in the second half of the night, due to staying up late and snacking but he is checking his blood sugars later in the afternoon.  I advised him to decrease Lantus to 80 units daily and also decrease NovoLog before breakfast and dinner.  Otherwise, we can continue [place] and Trulicity. -He has a freestyle libre 2 CGM and  would like to switch to the freestyle libre 3.  Prescription was called in. -I advised him to: Patient Instructions  Please continue: - Farxiga 10 mg before b'fast - Trulicity 4.5 mg weekly  Decrease: - Lantus to 80 units daily - Novolog 15 minutes before meals: 15 units in a.m. and 25 units before dinner  Try to stop eating at night, but if you have a snack, you may need to cover it with Novolog 5-8 units.  Please return in 4 months.  - we checked his HbA1c:  5.8% (lower) - advised to check sugars at different times of the day - 4x a day, rotating check times - advised for yearly eye exams >> he is UTD - due for ACR -but no lab tech available now >> will check this at next visit - return to clinic in 3-4 months  2. HL and 4.  Drug (statin)-induced myopathy -Latest lipid panel from 03/2022 showed a very low LDL and high triglycerides: Lab Results  Component Value Date   CHOL 96 04/14/2022   HDL 47 04/14/2022   LDLCALC 11 04/14/2022   TRIG 359 (H) 04/14/2022   CHOLHDL 2.0 04/14/2022  -Continue on Zetia 10 mg daily and Repatha, without side effects.  He could not tolerate statins due to myalgias.    3.  Thyroid hormone use -He was started on thyroid hormones by his  psychiatrist, without a diagnosis of hypothyroidism, apparently for symptom control -Latest TSH available for review was normal: Lab Results  Component Value Date   TSH 0.47 05/07/2020  -He continues on levothyroxine 50 mcg daily -Management per psychiatry  Carlus Pavlov, MD PhD [facility] Endocrinology

## 2022-12-18 ENCOUNTER — Other Ambulatory Visit: Payer: Self-pay | Admitting: Internal Medicine

## 2022-12-18 ENCOUNTER — Other Ambulatory Visit: Payer: Self-pay | Admitting: Family Medicine

## 2022-12-18 DIAGNOSIS — K219 Gastro-esophageal reflux disease without esophagitis: Secondary | ICD-10-CM

## 2022-12-18 DIAGNOSIS — B20 Human immunodeficiency virus [HIV] disease: Secondary | ICD-10-CM

## 2022-12-24 ENCOUNTER — Ambulatory Visit: Payer: PPO | Admitting: Family Medicine

## 2023-01-02 ENCOUNTER — Other Ambulatory Visit: Payer: Self-pay

## 2023-01-02 DIAGNOSIS — Z794 Long term (current) use of insulin: Secondary | ICD-10-CM

## 2023-01-02 MED ORDER — LANTUS SOLOSTAR 100 UNIT/ML ~~LOC~~ SOPN: 80.0000 [IU] | PEN_INJECTOR | SUBCUTANEOUS | 3 refills | Status: DC

## 2023-01-06 ENCOUNTER — Encounter: Payer: Self-pay | Admitting: Pharmacist

## 2023-01-06 NOTE — Progress Notes (Signed)
Pharmacy Quality Measure Review  This patient is appearing on report for being at risk of failing the measure for Statin Use in Persons with Diabetes (SUPD) medications this calendar year.   Prior trials of: PRAVASTATIN AND ATORVASTATIN - both caused myalgias.  Patient is taking Repatha and ezetimibe.   Lab Results  Component Value Date   CHOL 96 2022-04-17   HDL 47 Apr 17, 2022   LDLCALC 11 04-17-22   TRIG 359 (H) 04-17-2022   CHOLHDL 2.0 2022/04/17   Dr [name] coded for drug induced myopathy / G72.0 at visit 11/2022.   Patient should be excluded from SUPD for 2024.   Henrene Pastor, PharmD Clinical Pharmacist [facility] Primary Care  Population Health [phone]

## 2023-01-21 ENCOUNTER — Encounter: Payer: Self-pay | Admitting: Family Medicine

## 2023-01-21 ENCOUNTER — Ambulatory Visit: Payer: PPO | Admitting: Family Medicine

## 2023-01-29 ENCOUNTER — Telehealth: Payer: Self-pay | Admitting: *Deleted

## 2023-01-29 NOTE — Progress Notes (Unsigned)
  Care Coordination Note  01/29/2023 Name: Daniel [name] MRN: [id number] DOB: February 09, 1956  Daniel [name] is a 67 y.o. year old male who is a primary care patient of Shade Flood, MD and is actively engaged with the care management team. I reached out to Emeterio Reeve by phone today to assist with re-scheduling a follow up visit with the RN Case Manager  Follow up plan: Unsuccessful telephone outreach attempt made. A HIPAA compliant phone message was left for the patient providing contact information and requesting a return call.   [facility]  Care Coordination Care Guide  Direct Dial: [phone]

## 2023-02-03 ENCOUNTER — Other Ambulatory Visit: Payer: Self-pay | Admitting: *Deleted

## 2023-02-06 NOTE — Progress Notes (Unsigned)
  Care Coordination Note  02/06/2023 Name: Daniel [name] MRN: [id number] DOB: 14-Sep-1955  Daniel [name] is a 67 y.o. year old male who is a primary care patient of Shade Flood, MD and is actively engaged with the care management team. I reached out to Emeterio Reeve by phone today to assist with re-scheduling a follow up visit with the RN Case Manager  Follow up plan: Unsuccessful telephone outreach attempt made. A HIPAA compliant phone message was left for the patient providing contact information and requesting a return call.   [facility]  Care Coordination Care Guide  Direct Dial: [phone]

## 2023-02-07 ENCOUNTER — Other Ambulatory Visit: Payer: Self-pay | Admitting: Family Medicine

## 2023-02-10 NOTE — Progress Notes (Signed)
Complex Care Management  Outreach Note  02/10/2023 Name: Daniel [name] MRN: [id number] DOB: [birth year]   Complex Care Management Outreach Attempts: An unsuccessful telephone outreach was attempted today.   Follow Up Plan:  No further outreach attempts will be made at this time. We have been unable to contact the patient to offer or enroll patient in complex care management services.  Encounter Outcome:  No Answer  Gwenevere Ghazi  Care Coordination Care Guide  Direct Dial: [phone]

## 2023-02-26 ENCOUNTER — Ambulatory Visit: Payer: PPO | Admitting: Family Medicine

## 2023-03-03 ENCOUNTER — Other Ambulatory Visit: Payer: Self-pay | Admitting: Internal Medicine

## 2023-03-03 ENCOUNTER — Other Ambulatory Visit: Payer: Self-pay | Admitting: Cardiovascular Disease

## 2023-03-03 DIAGNOSIS — E785 Hyperlipidemia, unspecified: Secondary | ICD-10-CM

## 2023-03-03 DIAGNOSIS — Z794 Long term (current) use of insulin: Secondary | ICD-10-CM

## 2023-03-03 NOTE — Telephone Encounter (Signed)
 Farxiga refill request complete

## 2023-03-04 ENCOUNTER — Other Ambulatory Visit: Payer: Self-pay | Admitting: Family Medicine

## 2023-03-04 DIAGNOSIS — M792 Neuralgia and neuritis, unspecified: Secondary | ICD-10-CM

## 2023-03-04 DIAGNOSIS — E1142 Type 2 diabetes mellitus with diabetic polyneuropathy: Secondary | ICD-10-CM

## 2023-03-04 NOTE — Telephone Encounter (Signed)
 Requested Prescriptions   Pending Prescriptions Disp Refills   pregabalin  (LYRICA ) 300 MG capsule [Pharmacy Med Name: PREGABALIN  300MG  CAPSULES] 60 capsule     Sig: TAKE 1 CAPSULE(300 MG) BY MOUTH TWICE DAILY     Date of patient request: 03/04/2023 Last office visit: 09/22/2022 Upcoming visit: 03/05/2023 Date of last refill: 12/12/2022 Last refill amount: 60

## 2023-03-04 NOTE — Telephone Encounter (Signed)
 Medication briefly discussed at his August 14 video visit.  Controlled substance database reviewed.  Pregabalin No. 60 last filled on 01/21/2023.  Refill ordered.

## 2023-03-05 ENCOUNTER — Ambulatory Visit: Payer: PPO | Admitting: Family Medicine

## 2023-03-06 ENCOUNTER — Other Ambulatory Visit: Payer: Self-pay

## 2023-03-06 DIAGNOSIS — E0849 Diabetes mellitus due to underlying condition with other diabetic neurological complication: Secondary | ICD-10-CM

## 2023-03-06 MED ORDER — BD PEN NEEDLE SHORT U/F 31G X 8 MM MISC: 5 refills | Status: DC

## 2023-03-10 ENCOUNTER — Other Ambulatory Visit: Payer: Self-pay | Admitting: Family Medicine

## 2023-03-10 ENCOUNTER — Other Ambulatory Visit: Payer: Self-pay | Admitting: Internal Medicine

## 2023-03-10 DIAGNOSIS — Z8669 Personal history of other diseases of the nervous system and sense organs: Secondary | ICD-10-CM

## 2023-03-11 NOTE — Telephone Encounter (Signed)
 Requested Prescriptions   Pending Prescriptions Disp Refills   divalproex  (DEPAKOTE  ER) 500 MG 24 hr tablet [Pharmacy Med Name: DIVALPROEX  EXTENDED RELEASE 500MG  T] 90 tablet 1    Sig: TAKE 1 TABLET(500 MG) BY MOUTH AT BEDTIME     Date of patient request: 03/11/2023 Last office visit: 09/22/2022 Upcoming visit: 03/26/2023 Date of last refill: 06/24/2022 Last refill amount: 90

## 2023-03-11 NOTE — Telephone Encounter (Signed)
 Last visit with me October 08, 2022.  He canceled appointment with me on October 30th.  No-show on November 27.  Canceled again January 2nd.  Canceled due to conflict/need to reschedule again on January 9.  This medication requires ongoing monitoring including bloodwork. He does have an appointment with me January 30th.  Temporary refill provided until that visit but if he does not show up for that visit I am not sure if I can safely refill beyond that time.  Please advise patient of temporary refill and need to keep follow-up appointment.

## 2023-03-17 DIAGNOSIS — I83009 Varicose veins of unspecified lower extremity with ulcer of unspecified site: Secondary | ICD-10-CM | POA: Diagnosis not present

## 2023-03-26 ENCOUNTER — Encounter: Payer: Self-pay | Admitting: Family Medicine

## 2023-03-26 ENCOUNTER — Ambulatory Visit: Payer: PPO | Admitting: Family Medicine

## 2023-03-26 VITALS — BP 128/70 | HR 78 | Temp 97.7°F | Wt 249.2 lb

## 2023-03-26 DIAGNOSIS — Z8669 Personal history of other diseases of the nervous system and sense organs: Secondary | ICD-10-CM

## 2023-03-26 DIAGNOSIS — K146 Glossodynia: Secondary | ICD-10-CM | POA: Diagnosis not present

## 2023-03-26 DIAGNOSIS — G8929 Other chronic pain: Secondary | ICD-10-CM

## 2023-03-26 DIAGNOSIS — R1310 Dysphagia, unspecified: Secondary | ICD-10-CM | POA: Diagnosis not present

## 2023-03-26 DIAGNOSIS — Z23 Encounter for immunization: Secondary | ICD-10-CM | POA: Diagnosis not present

## 2023-03-26 DIAGNOSIS — E782 Mixed hyperlipidemia: Secondary | ICD-10-CM

## 2023-03-26 DIAGNOSIS — I10 Essential (primary) hypertension: Secondary | ICD-10-CM

## 2023-03-26 DIAGNOSIS — K219 Gastro-esophageal reflux disease without esophagitis: Secondary | ICD-10-CM

## 2023-03-26 DIAGNOSIS — M25562 Pain in left knee: Secondary | ICD-10-CM | POA: Diagnosis not present

## 2023-03-26 DIAGNOSIS — E1122 Type 2 diabetes mellitus with diabetic chronic kidney disease: Secondary | ICD-10-CM | POA: Diagnosis not present

## 2023-03-26 DIAGNOSIS — Z794 Long term (current) use of insulin: Secondary | ICD-10-CM

## 2023-03-26 DIAGNOSIS — Z79899 Other long term (current) drug therapy: Secondary | ICD-10-CM

## 2023-03-26 DIAGNOSIS — I824Y9 Acute embolism and thrombosis of unspecified deep veins of unspecified proximal lower extremity: Secondary | ICD-10-CM

## 2023-03-26 DIAGNOSIS — N183 Chronic kidney disease, stage 3 unspecified: Secondary | ICD-10-CM

## 2023-03-26 DIAGNOSIS — N1831 Chronic kidney disease, stage 3a: Secondary | ICD-10-CM | POA: Diagnosis not present

## 2023-03-26 DIAGNOSIS — Z7901 Long term (current) use of anticoagulants: Secondary | ICD-10-CM | POA: Diagnosis not present

## 2023-03-26 DIAGNOSIS — M792 Neuralgia and neuritis, unspecified: Secondary | ICD-10-CM

## 2023-03-26 DIAGNOSIS — M25561 Pain in right knee: Secondary | ICD-10-CM | POA: Diagnosis not present

## 2023-03-26 MED ORDER — PANTOPRAZOLE SODIUM 40 MG PO TBEC: 40.0000 mg | DELAYED_RELEASE_TABLET | Freq: Two times a day (BID) | ORAL | 1 refills | Status: DC

## 2023-03-26 MED ORDER — DIVALPROEX SODIUM ER 500 MG PO TB24: ORAL_TABLET | ORAL | 1 refills | Status: DC

## 2023-03-26 MED ORDER — RIVAROXABAN 20 MG PO TABS: ORAL_TABLET | ORAL | 2 refills | Status: DC

## 2023-03-26 MED ORDER — DICLOFENAC SODIUM 1 % EX GEL: CUTANEOUS | 2 refills | Status: AC

## 2023-03-26 NOTE — Progress Notes (Signed)
Subjective:  Patient ID: Daniel [name], male    DOB: February 14, 1956  Age: 68 y.o. MRN: [id number]  CC:  Chief Complaint  Patient presents with   Medical Management of Chronic Issues    Pt is doing well     HPI Daniel [name] presents for follow up.   Peripheral neuropathy, with history of diabetes. Treated by endocrinology for diabetes - Dr [name] 12/12/22. Decr lantus to 80u per day, novolog 15u in am, 25u with dinner. Continued farxiga, trulicity.  4 month followup. A1c 5.8 last visit.  He is on Lyrica 300 mg twice daily for peripheral neuropathy.  Risk of falls discussed in the past and has been referred to PT, OT previously and use of walker has been recommended previously. Not using walker at home - denies recent falls.  Lyrica has been effective in treating pain, neuropathic symptoms. Referred to dermatology last August for left lower extremity wound, question ulcer at that time given history of peripheral vascular disease and diabetes. Wound has been healing and continued to be followed by dermatologist in next few weeks.   Chronic knee pain Voltaren gel has been helpful to manage knee pain and assist with mobility previously. Using twice per day - managing pain.   Hypertension: With chronic kidney disease followed by nephrology, Dr. [name] with stage III CKD.  ACEs/ARB's have been avoided previously due to chronic renal failure with acute renal failure.  Prior creatinine baseline 1.5-1.7.  Treated with Farxiga and Lasix.  Creatinine monitoring recommended twice per year, to head off future episodes of AKI and avoiding ACE/ARB. Last nephrology eval lin 05/2022 - 6 month follow up planned. He thinks he has a visit with her soon or will schedule.  No new side effects with meds.  On cpap for OSA.  Appt with Edd Fabian, NP with cardiology 2/25.  BP Readings from Last 3 Encounters:  03/26/23 128/70  12/12/22 132/80  09/22/22 128/80   Lab Results  Component Value Date    CREATININE 1.53 (H) 04/14/2022   Hyperlipidemia: He is followed by cardiology, treated with Repatha, Zetia.appt with cardiology as above next month.  Lab Results  Component Value Date   CHOL 96 04/14/2022   HDL 47 04/14/2022   LDLCALC 11 04/14/2022   TRIG 359 (H) 04/14/2022   CHOLHDL 2.0 04/14/2022   Lab Results  Component Value Date   ALT 28 04/14/2022   AST 19 04/14/2022   ALKPHOS 124 (H) 10/08/2021   BILITOT 0.7 04/14/2022   HIV disease Treated with Triumeq.  Followed by Dr. [name] with infectious disease.appt in March. Yearly visits. HIV RNA not detected on 04/14/22 labs.   GERD Treated with avoidance of trigger foods, as well as Protonix daily.  Upset stomach, nausea with meds previously but Zofran was not covered. Breakthrough symptoms - feels like tongue burning at night only - not during day. Sore on sides of tongue. Pills are sticking with swallowing past 6 months. Several appts have been scheduled with ENT but he has cancelled. He does have appt with ENT, Dr. Aleene Davidson on 03/31/23 to discuss symptoms. No tongue coating, blisters, wounds. Sometimes dry tongu with use of CPAP - using humidifier on cpap.  Has taken protonix twice per day past 6 months.  Lab Results  Component Value Date   VITAMINB12 424 09/15/2019    Chronic anticoagulation with history of DVT Treated with Xarelto 20 mg daily.  Denies any new bleeding,,chest pain or dyspnea. Calves sore at times, both sides,  3-4 months. No new swelling.  No missed doses of xarelto.   Psychiatric He is followed by psychiatrist Dr. [name], treated with Rexulti, Xanax, Auvelity, amphetamine, Dayvigo, levothyroxine, and Depakote, with history of migraines as well. Anhedonia past 6 months - saw Dr [name] yesterday. New med trintellix started for these symptoms. 6 month follow up.  No recent migraine.   BPH with LUTS Followed by Dr. [name] with urology, treated with finasteride, silodosin. Still taking - urinating better.    HM: Due for urine microalbumin. Immunization History  Administered Date(s) Administered   Fluad Quad(high Dose 65+) 11/17/2018, 10/26/2019, 12/05/2020   H1N1 03/09/2008   Hepatitis A 09/09/2010, 05/29/2011   Influenza Split 12/06/2010, 12/04/2011   Influenza Whole 01/26/2004, 12/25/2005, 01/14/2007, 12/22/2007, 11/24/2009   Influenza, High Dose Seasonal PF 12/10/2021   Influenza,inj,Quad PF,6+ Mos 11/04/2012, 11/07/2013, 01/10/2015, 11/12/2015, 11/27/2016, 11/16/2017   Janssen (J&J) SARS-COV-2 Vaccination 07/04/2019   Moderna Sars-Covid-2 Vaccination 01/27/2020   Pneumococcal Conjugate-13 09/03/2006, 11/17/2018   Pneumococcal Polysaccharide-23 03/12/2000, 05/29/2011, 04/29/2012, 12/10/2021   Tdap 08/01/2014   Zoster Recombinant(Shingrix) 12/10/2016, 02/18/2017   Zoster, Live 11/08/2015  Covid booster: last one in 01/16/22.  Flu vaccine today.     History Patient Active Problem List   Diagnosis Date Noted   Type 2 diabetes mellitus with diabetic peripheral angiopathy without gangrene ([name]) 06/23/2022   Routine screening for STI (sexually transmitted infection) 04/29/2022   Mixed hyperlipidemia 09/19/2021   Type II diabetes mellitus with peripheral circulatory disorder ([name]) 09/19/2021   Insomnia due to mental condition 05/23/2021   Atherosclerosis of native artery of extremity ([name]) 04/25/2021   Chronic nonalcoholic liver disease 04/25/2021   History of hepatitis B 04/25/2021   Long term (current) use of insulin ([name]) 04/25/2021   Long term (current) use of anticoagulants 04/25/2021   Nonalcoholic steatohepatitis (NASH) 04/25/2021   Type 2 diabetes mellitus with other circulatory complications ([name]) 04/25/2021   Type 2 diabetes mellitus with peripheral angiopathy ([name]) 04/25/2021   Abnormal liver function tests 04/25/2021   Drug-induced myopathy 02/12/2021   Unilateral primary osteoarthritis, right knee 12/15/2019   Generalized weakness 09/14/2019   Severe major  depression ([name]) 11/17/2018   Class 2 obesity due to excess calories without serious comorbidity with body mass index (BMI) of 36.0 to 36.9 in adult 11/17/2018   Anticoagulated 08/24/2018   Dyslipidemia 07/27/2018   Essential hypertension 06/25/2018   Hypogonadism in male 05/10/2018   Numbness 03/31/2018   Diabetic peripheral neuropathy ([name]) 01/25/2018   S/P transmetatarsal amputation of foot, right ([name]) 10/09/2017   Hypothyroidism    Constipation    Obesity, Class III, BMI 40-49.9 (morbid obesity) ([name]) 01/15/2017   DOE (dyspnea on exertion) 01/13/2017   Imbalance 11/18/2016   Osteomyelitis of right foot ([name]) 09/22/2016   Chronic migraine 09/17/2016   Gait abnormality 09/17/2016   Fall 12/05/2015   Toe ulcer, right ([name]) 09/19/2015   Decreased pedal pulses 09/19/2015   OSA on CPAP 09/05/2015   Major depressive disorder, recurrent episode, moderate ([name]) 09/05/2015   Recurrent falls while walking 06/24/2015   H/O migraine 06/22/2015   Sinus headache 06/21/2015   OSA (obstructive sleep apnea) 06/20/2015   History of DVT (deep vein thrombosis) 06/20/2015   History of falling 06/20/2015   Chronic, continuous use of opioids 06/20/2015   Coagulopathy ([name]) 06/20/2015   Elevated CPK 06/20/2015   Pain syndrome, chronic 06/20/2015   Thrombocythemia 06/20/2015   Uncontrolled type 2 diabetes mellitus with hyperglycemia ([name]) 06/20/2015   Aspiration into airway 06/20/2015  Low back pain 06/11/2015   Unspecified abnormalities of gait and mobility 06/11/2015   Chronic kidney disease, stage III (moderate) ([name]) 08/09/2014   Renal insufficiency 08/09/2014   Insulin-requiring or dependent type II diabetes mellitus ([name]) 11/07/2013   Diabetes mellitus ([name]) 11/07/2013   Abnormal result on screening urine test 09/03/2006   Human immunodeficiency virus (HIV) infection ([name]) 06/04/2006   Herpes zoster 06/04/2006   Depression 06/04/2006   THROMBOPHLEBITIS NOS 06/04/2006    Gastroesophageal reflux disease 06/04/2006   Arthropathy of hand 06/04/2006   Attention deficit hyperactivity disorder 06/04/2006   Past Medical History:  Diagnosis Date   ADHD (attention deficit hyperactivity disorder)    Anxiety    Chronic kidney disease    Clotting disorder ([name])    Depression    Diabetes mellitus without complication ([name])    Diabetes mellitus, type II ([name])    Dizziness 03/17/2015   Essential hypertension 06/25/2018   GERD (gastroesophageal reflux disease)    HIV disease ([name])    HIV infection ([name])    Liver disease    OSA (obstructive sleep apnea) 07/25/2015   Uses CPAP regularly   Peripheral vascular disease ([name])    Ulcer    Past Surgical History:  Procedure Laterality Date   AMPUTATION Right 10/02/2017   Procedure: RIGHT TRANSMETATARSAL AMPUTATION;  Surgeon: Tarry Kos, MD;  Location: [facility] OR;  Service: Orthopedics;  Laterality: Right;   SMALL INTESTINE SURGERY     STOMACH SURGERY     TOE AMPUTATION Right 08/2016   right great toe   Allergies  Allergen Reactions   Aspirin Swelling   Efavirenz Swelling and Rash    Other reaction(s): anaphylaxis   Ibuprofen Swelling   Nsaids Other (See Comments)    unknwn   Pravastatin     myalgias   Lipitor [Atorvastatin Calcium] Other (See Comments)    Leg pain   Prior to Admission medications   Medication Sig Start Date End Date Taking? Authorizing Provider  acetaminophen (TYLENOL) 500 MG tablet Take 1,500 mg by mouth in the morning and at bedtime.   Yes [provider]  ALPRAZolam Prudy Feeler) 1 MG tablet Take 1 mg by mouth at bedtime. *May take one tablet up to 4 times daily as needed for anxiety   Yes [provider]  amphetamine-dextroamphetamine (ADDERALL) 30 MG tablet Take 30 mg by mouth 3 (three) times daily.   Yes [provider]  Continuous Glucose Sensor (FREESTYLE LIBRE 2 SENSOR) MISC CHECK GLUCOSE CONTINUOUSLY CHANGE SENSOR EVERY 14 DAYS. 03/11/23  Yes Carlus Pavlov, MD   diclofenac Sodium (VOLTAREN) 1 % GEL APPLY 2 GM TO EACH KNEE EVERY MORNING AND EVERY NIGHT AT BEDTIME, AND APPLY 1 GM TO EACH KNEE EVERY AFTERNOON 06/24/22  Yes Shade Flood, MD  divalproex (DEPAKOTE ER) 500 MG 24 hr tablet TAKE 1 TABLET(500 MG) BY MOUTH AT BEDTIME 03/11/23  Yes Shade Flood, MD  Dulaglutide (TRULICITY) 4.5 MG/0.5ML SOPN Inject 4.5 mg into the skin once a week. 09/22/22  Yes Carlus Pavlov, MD  ezetimibe (ZETIA) 10 MG tablet TAKE 1 TABLET(10 MG) BY MOUTH DAILY.  PLEASE CALL [phone] TO SCHEDULE AN OVER DUE FOLLOW APPOINTMENT FOR FURTHER REFILLS. 03/03/23  Yes Chilton Si, MD  FARXIGA 10 MG TABS tablet TAKE 1 TABLET(10 MG) BY MOUTH DAILY BEFORE BREAKFAST 03/03/23  Yes Carlus Pavlov, MD  finasteride (PROSCAR) 5 MG tablet Take 5 mg by mouth daily. 04/11/22  Yes [provider]  furosemide (LASIX) 40 MG tablet TAKE 1  TABLET(40 MG) BY MOUTH DAILY 02/09/23  Yes Shade Flood, MD  insulin glargine (LANTUS SOLOSTAR) 100 UNIT/ML Solostar Pen Inject 80-100 Units into the skin every morning. 01/02/23  Yes Carlus Pavlov, MD  insulin lispro (HUMALOG KWIKPEN) 100 UNIT/ML KwikPen Inject under skin 20 units with breakfast and 30 units with supper. 09/08/22  Yes Carlus Pavlov, MD  Insulin Pen Needle (B-D ULTRAFINE III SHORT PEN) 31G X 8 MM MISC USE AS DIRECTED THREE TIMES DAILY 03/06/23  Yes Carlus Pavlov, MD  Lemborexant (DAYVIGO) 10 MG TABS Take 10 mg by mouth at bedtime.   Yes [provider]  levothyroxine (SYNTHROID, LEVOTHROID) 50 MCG tablet Take 1 tablet (50 mcg total) by mouth at bedtime. 07/24/15  Yes Collene Gobble, MD  pantoprazole (PROTONIX) 40 MG tablet TAKE 1 TABLET(40 MG) BY MOUTH DAILY 12/18/22  Yes Shade Flood, MD  pregabalin (LYRICA) 300 MG capsule TAKE 1 CAPSULE(300 MG) BY MOUTH TWICE DAILY 03/04/23  Yes Shade Flood, MD  REPATHA SURECLICK 140 MG/ML SOAJ ADMINISTER 1 ML UNDER THE SKIN EVERY 14 DAYS 11/17/22  Yes Chilton Si, MD  rivaroxaban (XARELTO) 20 MG TABS tablet TAKE 1 TABLET(20 MG) BY MOUTH DAILY WITH SUPPER 06/24/22  Yes Shade Flood, MD  TRIUMEQ 600-50-300 MG tablet TAKE 1 TABLET BY MOUTH DAILY 12/18/22  Yes Comer, Belia Heman, MD  Dextromethorphan-buPROPion ER (AUVELITY) 45-105 MG TBCR Take 1 tablet by mouth daily at 12 noon.    [provider]  silodosin (RAPAFLO) 8 MG CAPS capsule Take 8 mg by mouth daily. Patient not taking: Reported on 03/26/2023    [provider]   Social History   Socioeconomic History   Marital status: Single    Spouse name: Not on file   Number of children: Not on file   Years of education: Not on file   Highest education level: 12th grade  Occupational History    Comment: DISABILITY  Tobacco Use   Smoking status: Former    Current packs/day: 0.00    Average packs/day: 0.1 packs/day for 10.0 years (1.0 ttl pk-yrs)    Types: Cigars, Cigarettes    Start date: 08/08/2004    Quit date: 08/09/2014    Years since quitting: 8.6   Smokeless tobacco: Never  Vaping Use   Vaping status: Never Used  Substance and Sexual Activity   Alcohol use: No    Alcohol/week: 0.0 standard drinks of alcohol   Drug use: No   Sexual activity: Not Currently    Partners: Male    Comment: pt. declined condoms  Other Topics Concern   Not on file  Social History Narrative   Epworth Sleepiness Scale = 7 (as of 03/16/2015)   Social Drivers of Health   Financial Resource Strain: Low Risk  (09/20/2022)   Overall Financial Resource Strain (CARDIA)    Difficulty of Paying Living Expenses: Not hard at all  Food Insecurity: No Food Insecurity (09/20/2022)   Hunger Vital Sign    Worried About Running Out of Food in the Last Year: Never true    Ran Out of Food in the Last Year: Never true  Transportation Needs: No Transportation Needs (09/20/2022)   PRAPARE - Administrator, Civil Service (Medical): No    Lack of Transportation (Non-Medical): No  Physical  Activity: Unknown (09/20/2022)   Exercise Vital Sign    Days of Exercise per Week: 0 days    Minutes of Exercise per Session: Not on file  Stress:  No Stress Concern Present (09/20/2022)   [employer] of Occupational Health - Occupational Stress Questionnaire    Feeling of Stress : Only a little  Social Connections: Socially Isolated (09/20/2022)   Social Connection and Isolation Panel [NHANES]    Frequency of Communication with Friends and Family: Twice a week    Frequency of Social Gatherings with Friends and Family: Once a week    Attends Religious Services: Never    Database administrator or Organizations: No    Attends Engineer, structural: Not on file    Marital Status: Never married  Intimate Partner Violence: Not At Risk (10/01/2017)   Humiliation, Afraid, Rape, and Kick questionnaire    Fear of Current or Ex-Partner: No    Emotionally Abused: No    Physically Abused: No    Sexually Abused: No    Review of Systems  Per HPI Objective:   Vitals:   03/26/23 1459  BP: 128/70  Pulse: 78  Temp: 97.7 F (36.5 C)  TempSrc: Temporal  SpO2: 99%  Weight: 249 lb 3.2 oz (113 kg)     Physical Exam Vitals reviewed.  Constitutional:      Appearance: He is well-developed.  HENT:     Head: Normocephalic and atraumatic.     Mouth/Throat:     Mouth: Mucous membranes are moist.     Pharynx: No oropharyngeal exudate.     Comments: No lesions appreciated on tongue or buccal mucosa.  No significant coating noted on these areas either.  Visualized portion of posterior oropharynx without exudate or lesions Neck:     Vascular: No carotid bruit or JVD.  Cardiovascular:     Rate and Rhythm: Normal rate and regular rhythm.     Heart sounds: Normal heart sounds. No murmur heard. Pulmonary:     Effort: Pulmonary effort is normal. No respiratory distress.     Breath sounds: Normal breath sounds. No stridor. No wheezing or rales.  Musculoskeletal:     Right lower leg: Right  lower leg edema: 1+ pedal bilat. no calf pain, neg homans..     Left lower leg: Edema present.  Skin:    General: Skin is warm and dry.  Neurological:     Mental Status: He is alert and oriented to person, place, and time.  Psychiatric:        Mood and Affect: Mood normal.     54 minutes spent during visit, including chart review, counseling and assimilation of information regarding multiple medical concerns, chronic conditions as above including review of specialist notes, labs, exam, discussion of plan, and chart completion.   Assessment & Plan:  ALMER BUSHEY is a 68 y.o. male . Gastroesophageal reflux disease, unspecified whether esophagitis present - Plan: pantoprazole (PROTONIX) 40 MG tablet Long-term current use of proton pump inhibitor therapy - Plan: pantoprazole (PROTONIX) 40 MG tablet, B12 Pill dysphagia - Plan: Ambulatory referral to Gastroenterology  -Okay to continue twice daily PPI for now given tongue symptoms, pill dysphagia which could be related to reflux.  Close follow-up planned with ENT next week, gastroenterology referral ordered.  Mixed hyperlipidemia - Plan: Comprehensive metabolic panel, Lipid panel  -Tolerating current med regimen, check labs and adjust plan accordingly.  Keep follow-up with cardiology as above.  Type 2 diabetes mellitus with stage 3a chronic kidney disease, with long-term current use of insulin ([name]) - Plan: Comprehensive metabolic panel, Lipid panel, Microalbumin / creatinine urine ratio Peripheral neuropathic pain  -Improved A1c on most recent endocrinology  eval.  Tolerating current med changes but does note some increased readings.  Recommended discussing with his endocrinologist.  Lyrica treating peripheral neuropathy symptoms without any side effects, continue same.  Need for influenza vaccination - Plan: Flu Vaccine Trivalent High Dose (Fluad)  Tongue pain - Plan: pantoprazole (PROTONIX) 40 MG tablet, B12  -I do not see any tongue  lesions at this time.  Symptoms primarily overnight, question dry mouth with use of CPAP.  Also with pill dysphagia as above, will initially follow-up with ENT next week, check B12 with higher dose and chronic PPI use.  Stage 3 chronic kidney disease, unspecified whether stage 3a or 3b CKD ([name])  -Check labs as above and follow-up with nephrology.  He plans to call and schedule appointment if not already scheduled.  Chronic pain of both knees  -Stable with topical diclofenac, continue same.  History of migraine  -No recent migraine with use of Depakote, continue same, follow-up with psychiatry for other medications.  Recently started Trintellix, anticipate this will help with some of his anhedonia but continue follow-up plan with psychiatry.  Chronic anticoagulation  -Tolerating current dose of Xarelto without new bleeding, continue same.  Meds ordered this encounter  Medications   pantoprazole (PROTONIX) 40 MG tablet    Sig: Take 1 tablet (40 mg total) by mouth 2 (two) times daily.    Dispense:  180 tablet    Refill:  1   Patient Instructions  There is a newer booster that came out last fall.  He can have that performed at your pharmacy as the last booster I see on your card was from November 2023.  For the difficulty with pills sticking with swallowing, keep follow-up with ear nose and throat specialist as planned next week.  I will also place a referral to gastroenterology.  I think it is reasonable to continue the Protonix twice per day for now.  Discussed the tongue symptoms with ENT as well.  That would be less likely due to heartburn.  With only intermittent symptoms of the tongue it would be less likely thrush and I do not see any definite signs of thrush, infection or wounds of the tongue today.  Again discussed the symptoms with ear nose and throat doctor next week.   If any concerns on your labs from today I will let you know but keep follow-up with your other specialist as  planned and let me know if you have questions.  Recheck in 42-months.   Return to the clinic or go to the nearest emergency room if any of your symptoms worsen or new symptoms occur.        Signed,   Meredith Staggers, MD  Primary Care, [facility] 03/26/23 3:29 PM

## 2023-03-26 NOTE — Patient Instructions (Addendum)
There is a newer booster that came out last fall.  He can have that performed at your pharmacy as the last booster I see on your card was from November 2023.  For the difficulty with pills sticking with swallowing, keep follow-up with ear nose and throat specialist as planned next week.  I will also place a referral to gastroenterology.  I think it is reasonable to continue the Protonix twice per day for now.  Discussed the tongue symptoms with ENT as well.  That would be less likely due to heartburn.  With only intermittent symptoms of the tongue it would be less likely thrush and I do not see any definite signs of thrush, infection or wounds of the tongue today.  Again discussed the symptoms with ear nose and throat doctor next week.   If any concerns on your labs from today I will let you know but keep follow-up with your other specialist as planned and let me know if you have questions.  Recheck in 54-months.   Return to the clinic or go to the nearest emergency room if any of your symptoms worsen or new symptoms occur.

## 2023-03-27 LAB — COMPREHENSIVE METABOLIC PANEL
ALT: 36 U/L (ref 0–53)
AST: 31 U/L (ref 0–37)
Albumin: 4.5 g/dL (ref 3.5–5.2)
Alkaline Phosphatase: 93 U/L (ref 39–117)
BUN: 27 mg/dL — ABNORMAL HIGH (ref 6–23)
CO2: 33 meq/L — ABNORMAL HIGH (ref 19–32)
Calcium: 9.4 mg/dL (ref 8.4–10.5)
Chloride: 102 meq/L (ref 96–112)
Creatinine, Ser: 1.63 mg/dL — ABNORMAL HIGH (ref 0.40–1.50)
GFR: 42.71 mL/min — ABNORMAL LOW (ref >60.00)
Glucose, Bld: 82 mg/dL (ref 70–99)
Potassium: 4.3 meq/L (ref 3.5–5.1)
Sodium: 142 meq/L (ref 135–145)
Total Bilirubin: 0.9 mg/dL (ref 0.2–1.2)
Total Protein: 7.5 g/dL (ref 6.0–8.3)

## 2023-03-27 LAB — LIPID PANEL
Cholesterol: 115 mg/dL (ref 0–200)
HDL: 46.1 mg/dL (ref >39.00)
LDL Cholesterol: 20 mg/dL (ref 0–99)
NonHDL: 69.31
Total CHOL/HDL Ratio: 3
Triglycerides: 249 mg/dL — ABNORMAL HIGH (ref 0.0–149.0)
VLDL: 49.8 mg/dL — ABNORMAL HIGH (ref 0.0–40.0)

## 2023-03-27 LAB — MICROALBUMIN / CREATININE URINE RATIO
Creatinine,U: 32.2 mg/dL
Microalb Creat Ratio: 3.9 mg/g (ref 0.0–30.0)
Microalb, Ur: 1.2 mg/dL (ref 0.0–1.9)

## 2023-03-27 LAB — VITAMIN B12: Vitamin B-12: 596 pg/mL (ref 211–911)

## 2023-03-29 ENCOUNTER — Encounter: Payer: Self-pay | Admitting: Family Medicine

## 2023-04-04 ENCOUNTER — Encounter (INDEPENDENT_AMBULATORY_CARE_PROVIDER_SITE_OTHER): Payer: Self-pay

## 2023-04-08 ENCOUNTER — Other Ambulatory Visit: Payer: Self-pay | Admitting: Family Medicine

## 2023-04-08 DIAGNOSIS — M792 Neuralgia and neuritis, unspecified: Secondary | ICD-10-CM

## 2023-04-08 DIAGNOSIS — E1142 Type 2 diabetes mellitus with diabetic polyneuropathy: Secondary | ICD-10-CM

## 2023-04-09 ENCOUNTER — Other Ambulatory Visit: Payer: Self-pay

## 2023-04-09 DIAGNOSIS — M792 Neuralgia and neuritis, unspecified: Secondary | ICD-10-CM

## 2023-04-09 DIAGNOSIS — E1142 Type 2 diabetes mellitus with diabetic polyneuropathy: Secondary | ICD-10-CM

## 2023-04-09 MED ORDER — PREGABALIN 300 MG PO CAPS: ORAL_CAPSULE | ORAL | 0 refills | Status: DC

## 2023-04-09 NOTE — Telephone Encounter (Signed)
Prescription printed yesterday while trying to refill, patient does have follow up scheduled

## 2023-04-09 NOTE — Telephone Encounter (Signed)
Controlled substance database reviewed.  Pregabalin No. [id number] last filled on 03/05/2023.  Medication recently discussed.  Refill ordered.

## 2023-04-09 NOTE — Telephone Encounter (Signed)
Copied from CRM [id number]. Topic: Clinical - Prescription Issue >> Apr 08, 2023  4:23 PM Adaysia C wrote: Reason for CRM: Patient is currently at the pharmacy trying to pick up the prescription ON:[id number] (LYRICA) 300 MG capsule. The pharmacy is still showing they have not recieved the prescription yet. Please follow up with patient pharmacy as soon as possible:  [id number] - [place],  [place] [name] [facility] [name] [place], [place] [address] Phone: [phone]  Fax: [phone]

## 2023-04-15 ENCOUNTER — Other Ambulatory Visit: Payer: PPO

## 2023-04-16 ENCOUNTER — Other Ambulatory Visit: Payer: Self-pay

## 2023-04-16 MED ORDER — TRULICITY 4.5 MG/0.5ML ~~LOC~~ SOAJ: 4.5000 mg | SUBCUTANEOUS | 0 refills | Status: DC

## 2023-04-17 NOTE — Progress Notes (Unsigned)
 Cardiology Clinic Note   Patient Name: Daniel [name] Date of Encounter: 04/21/2023  Primary Care Provider:  Shade Flood, MD Primary Cardiologist:  Chilton Si, MD  Patient Profile    Daniel [name] 68 year old male presents to the clinic today for follow-up evaluation of his hypertension and DOE.  Past Medical History    Past Medical History:  Diagnosis Date   ADHD (attention deficit hyperactivity disorder)    Anxiety    Chronic kidney disease    Clotting disorder ([name])    Depression    Diabetes mellitus without complication ([name])    Diabetes mellitus, type II ([name])    Dizziness 03/17/2015   Essential hypertension 06/25/2018   GERD (gastroesophageal reflux disease)    HIV disease ([name])    HIV infection ([name])    Liver disease    OSA (obstructive sleep apnea) 07/25/2015   Uses CPAP regularly   Peripheral vascular disease ([name])    Ulcer    Past Surgical History:  Procedure Laterality Date   AMPUTATION Right 10/02/2017   Procedure: RIGHT TRANSMETATARSAL AMPUTATION;  Surgeon: Tarry Kos, MD;  Location: [facility] OR;  Service: Orthopedics;  Laterality: Right;   SMALL INTESTINE SURGERY     STOMACH SURGERY     TOE AMPUTATION Right 08/2016   right great toe    Allergies  Allergies  Allergen Reactions   Aspirin Swelling   Efavirenz Swelling and Rash    Other reaction(s): anaphylaxis   Ibuprofen Swelling   Nsaids Other (See Comments)    unknwn   Pravastatin     myalgias   Lipitor [Atorvastatin Calcium] Other (See Comments)    Leg pain    History of Present Illness    Daniel [name] has a PMH of HIV, 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  His LVEF 2/17 was noted to be 60 to 65% with mild LVH.  He had carotid Dopplers 1/17 which showed minimal plaque bilaterally.  He underwent stress testing which was negative for ischemia.  During that time he also had nerve conduction studies that showed  peripheral neuropathy consistent with diabetic neuropathy.  He wore a 30-day cardiac event monitor 6/17 which was unremarkable.  He underwent amputation of toes on his right lower extremity.  ABIs 8/17 prior to great toe amputation were normal.  He was noted to have atypical chest pain and had nuclear stress testing 2/20 which showed an LVEF of 59% no ischemia.  He continued to follow-up with cardiology.  He was last seen by Dr. Duke Salvia 10/08/2021.  During that time he reported a couple mechanical falls.  He denied major injuries.  He did note after 1 fall he required stitches to close a right periorbital wound.  He described his symptoms as feeling offkilter.  He denied dizziness.  He noted that he would bump into objects such as doors more frequently.  While walking he sometimes noted bilateral knee pain.  He denied anginal symptoms.  He also noted ongoing lower extremity swelling.  After taking furosemide he did not notice urinary frequency.  He did note urinary retention at times when trying to use the restroom.  He did not have a formal exercise routine.  He attributed his inactivity mainly to laziness.  He also noted some difficulty with his memory over the past 6 months-year.  He was following with his PCP.  He remained compliant with his CPAP.  He denied palpitations, chest pain, headaches,  syncope, orthopnea and PND.  He presents to the clinic today for follow up evaluation and states he has been eating frozen dinners and drinking half gallon of milk.  He is sedentary.  We reviewed his previous cardiac history.  He expressed understanding.  His blood pressure today is 136/76.  He does have bilateral lower extremity nonpitting edema.  He reports that he is a difficult patient and will probably not do what I recommend.  He notes that he receives his medications from [facility] and does not remember when he was seen by cardiology last.  I recommended that he reduce the sodium in his diet, increase his  physical activity, elevate lower extremities, continue lower extremity support stockings, and reduce his fluid intake.  I will increase his furosemide to 60 mg x 3 days and then have him resume his 40 mg daily.  Will plan follow-up in 9-12 months.  Today he denies chest pain, shortness of breath, melena, hematuria, hemoptysis, diaphoresis, weakness, presyncope, syncope, orthopnea, and PND.    Home Medications    Prior to Admission medications   Medication Sig Start Date End Date Taking? Authorizing Provider  acetaminophen (TYLENOL) 500 MG tablet Take 1,500 mg by mouth in the morning and at bedtime.    [provider]  ALPRAZolam Prudy Feeler) 1 MG tablet Take 1 mg by mouth at bedtime. *May take one tablet up to 4 times daily as needed for anxiety    [provider]  amphetamine-dextroamphetamine (ADDERALL) 30 MG tablet Take 30 mg by mouth 3 (three) times daily.    [provider]  Continuous Glucose Sensor (FREESTYLE LIBRE 2 SENSOR) MISC CHECK GLUCOSE CONTINUOUSLY CHANGE SENSOR EVERY 14 DAYS. 03/11/23   Carlus Pavlov, MD  Dextromethorphan-buPROPion ER (AUVELITY) 45-105 MG TBCR Take 1 tablet by mouth daily at 12 noon.    [provider]  diclofenac Sodium (VOLTAREN) 1 % GEL APPLY 2 GM TO EACH KNEE EVERY MORNING AND EVERY NIGHT AT BEDTIME, AND APPLY 1 GM TO EACH KNEE EVERY AFTERNOON 03/26/23   Shade Flood, MD  divalproex (DEPAKOTE ER) 500 MG 24 hr tablet TAKE 1 TABLET(500 MG) BY MOUTH AT BEDTIME 03/26/23   Shade Flood, MD  Dulaglutide (TRULICITY) 4.5 MG/0.5ML SOAJ Inject 4.5 mg into the skin once a week. 04/16/23   Carlus Pavlov, MD  ezetimibe (ZETIA) 10 MG tablet TAKE 1 TABLET(10 MG) BY MOUTH DAILY.  PLEASE CALL [phone] TO SCHEDULE AN OVER DUE FOLLOW APPOINTMENT FOR FURTHER REFILLS. 03/03/23   Chilton Si, MD  FARXIGA 10 MG TABS tablet TAKE 1 TABLET(10 MG) BY MOUTH DAILY BEFORE BREAKFAST 03/03/23   Carlus Pavlov, MD  finasteride (PROSCAR) 5  MG tablet Take 5 mg by mouth daily. 04/11/22   [provider]  furosemide (LASIX) 40 MG tablet TAKE 1 TABLET(40 MG) BY MOUTH DAILY 02/09/23   Shade Flood, MD  insulin glargine (LANTUS SOLOSTAR) 100 UNIT/ML Solostar Pen Inject 80-100 Units into the skin every morning. 01/02/23   Carlus Pavlov, MD  insulin lispro (HUMALOG KWIKPEN) 100 UNIT/ML KwikPen Inject under skin 20 units with breakfast and 30 units with supper. 09/08/22   Carlus Pavlov, MD  Insulin Pen Needle (B-D ULTRAFINE III SHORT PEN) 31G X 8 MM MISC USE AS DIRECTED THREE TIMES DAILY 03/06/23   Carlus Pavlov, MD  Lemborexant (DAYVIGO) 10 MG TABS Take 10 mg by mouth at bedtime.    [provider]  levothyroxine (SYNTHROID, LEVOTHROID) 50 MCG tablet Take 1 tablet (50 mcg total) by  mouth at bedtime. 07/24/15   Collene Gobble, MD  pantoprazole (PROTONIX) 40 MG tablet Take 1 tablet (40 mg total) by mouth 2 (two) times daily. 03/26/23   Shade Flood, MD  pregabalin (LYRICA) 300 MG capsule TAKE 1 CAPSULE(300 MG) BY MOUTH TWICE DAILY 04/09/23   Shade Flood, MD  REPATHA SURECLICK 140 MG/ML SOAJ ADMINISTER 1 ML UNDER THE SKIN EVERY 14 DAYS 11/17/22   Chilton Si, MD  rivaroxaban (XARELTO) 20 MG TABS tablet TAKE 1 TABLET(20 MG) BY MOUTH DAILY WITH SUPPER 03/26/23   Shade Flood, MD  silodosin (RAPAFLO) 8 MG CAPS capsule Take 8 mg by mouth daily. Patient not taking: Reported on 03/26/2023    [provider]  TRIUMEQ 600-50-300 MG tablet TAKE 1 TABLET BY MOUTH DAILY 12/18/22   Comer, Belia Heman, MD    Family History    Family History  Problem Relation Age of Onset   Depression Brother    Throat cancer Brother        half brother, never smoker   COPD Mother    Diabetes Neg Hx    He indicated that his mother is deceased. He indicated that the status of his father is unknown. He indicated that his sister is alive. He indicated that his brother is alive. He indicated that his maternal  grandmother is alive. He indicated that his maternal grandfather is deceased. He indicated that his paternal grandmother is deceased. He indicated that his paternal grandfather is deceased. He indicated that the status of his neg hx is unknown.  Social History    Social History   Socioeconomic History   Marital status: Single    Spouse name: Not on file   Number of children: Not on file   Years of education: Not on file   Highest education level: 12th grade  Occupational History    Comment: DISABILITY  Tobacco Use   Smoking status: Former    Current packs/day: 0.00    Average packs/day: 0.1 packs/day for 10.0 years (1.0 ttl pk-yrs)    Types: Cigars, Cigarettes    Start date: 08/08/2004    Quit date: 08/09/2014    Years since quitting: 8.7   Smokeless tobacco: Never  Vaping Use   Vaping status: Never Used  Substance and Sexual Activity   Alcohol use: No    Alcohol/week: 0.0 standard drinks of alcohol   Drug use: No   Sexual activity: Not Currently    Partners: Male    Comment: pt. declined condoms  Other Topics Concern   Not on file  Social History Narrative   Epworth Sleepiness Scale = 7 (as of 03/16/2015)   Social Drivers of Health   Financial Resource Strain: Low Risk  (09/20/2022)   Overall Financial Resource Strain (CARDIA)    Difficulty of Paying Living Expenses: Not hard at all  Food Insecurity: No Food Insecurity (09/20/2022)   Hunger Vital Sign    Worried About Running Out of Food in the Last Year: Never true    Ran Out of Food in the Last Year: Never true  Transportation Needs: No Transportation Needs (09/20/2022)   PRAPARE - Administrator, Civil Service (Medical): No    Lack of Transportation (Non-Medical): No  Physical Activity: Unknown (09/20/2022)   Exercise Vital Sign    Days of Exercise per Week: 0 days    Minutes of Exercise per Session: Not on file  Stress: No Stress Concern Present (09/20/2022)   [employer] of  Occupational Health -  Occupational Stress Questionnaire    Feeling of Stress : Only a little  Social Connections: Socially Isolated (09/20/2022)   Social Connection and Isolation Panel [NHANES]    Frequency of Communication with Friends and Family: Twice a week    Frequency of Social Gatherings with Friends and Family: Once a week    Attends Religious Services: Never    Database administrator or Organizations: No    Attends Engineer, structural: Not on file    Marital Status: Never married  Intimate Partner Violence: Not At Risk (10/01/2017)   Humiliation, Afraid, Rape, and Kick questionnaire    Fear of Current or Ex-Partner: No    Emotionally Abused: No    Physically Abused: No    Sexually Abused: No     Review of Systems    General:  No chills, fever, night sweats or weight changes.  Cardiovascular:  No chest pain, dyspnea on exertion, edema, orthopnea, palpitations, paroxysmal nocturnal dyspnea. Dermatological: No rash, lesions/masses Respiratory: No cough, dyspnea Urologic: No hematuria, dysuria Abdominal:   No nausea, vomiting, diarrhea, bright red blood per rectum, melena, or hematemesis Neurologic:  No visual changes, wkns, changes in mental status. All other systems reviewed and are otherwise negative except as noted above.  Physical Exam    VS:  BP 136/76 (BP Location: Right Arm, Patient Position: Sitting, Cuff Size: Normal)   Ht 5\' 10"  (1.778 m)   Wt 253 lb 9.6 oz (115 kg)   SpO2 96%   BMI 36.39 kg/m  , BMI Body mass index is 36.39 kg/m. GEN: Well nourished, well developed, in no acute distress. HEENT: normal. Neck: Supple, no JVD, carotid bruits, or masses. Cardiac: RRR, no murmurs, rubs, or gallops. No clubbing, cyanosis, generalized bilateral lower extremity edema nonpitting.  Radials/DP/PT 2+ and equal bilaterally.  Respiratory:  Respirations regular and unlabored, clear to auscultation bilaterally. GI: Soft, nontender, nondistended, BS + x 4. MS: no deformity or  atrophy. Skin: warm and dry, no rash. Neuro:  Strength and sensation are intact. Psych: Normal affect.  Accessory Clinical Findings    Recent Labs: 03/26/2023: ALT 36; BUN 27; Creatinine, Ser 1.63; Potassium 4.3; Sodium 142   Recent Lipid Panel    Component Value Date/Time   CHOL 115 03/26/2023 1639   CHOL 100 10/08/2021 1443   TRIG 249.0 (H) 03/26/2023 1639   HDL 46.10 03/26/2023 1639   HDL 49 10/08/2021 1443   CHOLHDL 3 03/26/2023 1639   VLDL 49.8 (H) 03/26/2023 1639   LDLCALC 20 03/26/2023 1639   LDLCALC 11 04/14/2022 1529         ECG personally reviewed by me today- EKG Interpretation Date/Time:  Tuesday April 21 2023 16:03:31 EST Ventricular Rate:  95 PR Interval:  186 QRS Duration:  100 QT Interval:  354 QTC Calculation: 444 R Axis:   74  Text Interpretation: Normal sinus rhythm Incomplete right bundle branch block When compared with ECG of 14-Sep-2019 15:01, PREVIOUS ECG IS PRESENT Confirmed by Edd Fabian [phone]) on 04/21/2023 4:04:22 PM    Echocardiogram 01/31/2020  IMPRESSIONS    1. Left ventricular ejection fraction, by estimation, is 60 to 65%. The  left ventricle has normal function. The left ventricle has no regional  wall motion abnormalities. There is mild left ventricular hypertrophy.  Left ventricular diastolic parameters  are consistent with Grade I diastolic dysfunction (impaired relaxation).   2. Right ventricular systolic function is normal. The right ventricular  size is normal.  3. The mitral valve is grossly normal. No evidence of mitral valve  regurgitation.   4. The aortic valve was not well visualized. Aortic valve regurgitation  is not visualized.   5. Aortic dilatation noted. There is borderline dilatation of the aortic  root, measuring 39 mm.   6. The inferior vena cava is normal in size with greater than 50%  respiratory variability, suggesting right atrial pressure of 3 mmHg.   Comparison(s): A prior study was performed  on 04/05/2015. No significant  change from prior study.    Lexiscan Myoview 03/2018: Normal perfusion NO ischemia or scar Nuclear stress EF: 59%. There was no ST segment deviation noted during stress. The study is normal. This is a low risk study.   30 day Event Monitor 07/30/15: Quality: Fair.  Baseline artifact.   Sinus rhythm and sinus tachycardia noted during an episode of syncope.   Echo 04/05/15: Study Conclusions   - Left ventricle: The cavity size was normal. Wall thickness was   increased in a pattern of mild LVH. Systolic function was normal.   The estimated ejection fraction was in the range of 60% to 65%.   Biplane speckle tracking LVEF was not accurately measured and   therefore not reported. Wall motion was normal; there were no   regional wall motion abnormalities. Left ventricular diastolic   function parameters were normal for the patient&'s age. - Aortic valve: Mildly calcified annulus. Trileaflet. - Mitral valve: Calcified annulus. There was trivial regurgitation. - Right atrium: Central venous pressure (est): 3 mm Hg. - Tricuspid valve: There was physiologic regurgitation. - Pulmonary arteries: Systolic pressure could not be accurately   estimated. - Pericardium, extracardiac: There was no pericardial effusion.   Carotid Doppler 03/23/15: IMPRESSION: 1. Trace smooth heterogeneous plaque in the distal left common carotid artery. 2. No evidence of internal carotid plaque or stenosis bilaterally. 3. Vertebral arteries remain patent with normal antegrade flow.       Assessment & Plan   1.  Essential hypertension-BP today 136/76. Maintain blood pressure log Continue furosemide Heart healthy low-sodium diet Increase physical activity as tolerated  DOE-stable.  Chronic.  Unremarkable stress testing and echocardiogram.  Appears to be related to body habitus and deconditioning. Increase physical activity as tolerated Continue weight loss  Critical limb  ischemia-continues with diabetic neuropathy.  Status post great toe amputation right lower extremity.  Lower extremity ABIs 10/16/2021 showed right and left ABIs in normal range. Continue Repatha, ezetimibe Maintain physical activity/increase as tolerated  Hyperlipidemia-LDL 20 on 03/26/23. Continue ezetimibe, Repatha High-fiber diet  History of DVT/PE-reports compliance with Xarelto.  Denies bleeding issues. Follows with PCP  Bilateral lower extremity edema-has bilateral lower extremity generalized nonpitting edema.  Comes in wearing support stockings today.  I asked him to reduce his fluid consumption, elevate his lower extremities, and eat a low-sodium diet.  He reports that he eats frozen meals, drinks half gallon of milk per day, and is unable to elevate his lower extremities. I will increase his furosemide to 60 mg x 3 days and then have him resume 40 Fluid restriction Elevate lower extremities when not active Heart healthy low-sodium diet-salty 6 diet sheet given  Disposition: Follow-up with Dr. Duke Salvia or APP in 9-12 months.   Thomasene Ripple. Brayln Duque NP-C     04/21/2023, 4:04 PM [facility] [address] Office [phone] Fax [phone]    I spent 14 minutes examining this patient, reviewing medications, and using patient centered shared decision  making involving their cardiac care.   I spent  20 minutes reviewing past medical history,  medications, and prior cardiac tests.

## 2023-04-21 ENCOUNTER — Encounter: Payer: Self-pay | Admitting: General Practice

## 2023-04-21 ENCOUNTER — Other Ambulatory Visit: Payer: PPO

## 2023-04-21 ENCOUNTER — Ambulatory Visit: Payer: PPO | Attending: General Practice | Admitting: General Practice

## 2023-04-21 VITALS — BP 136/76 | Ht 70.0 in | Wt 253.6 lb

## 2023-04-21 DIAGNOSIS — R0609 Other forms of dyspnea: Secondary | ICD-10-CM | POA: Diagnosis not present

## 2023-04-21 DIAGNOSIS — Z86718 Personal history of other venous thrombosis and embolism: Secondary | ICD-10-CM | POA: Diagnosis not present

## 2023-04-21 DIAGNOSIS — I70229 Atherosclerosis of native arteries of extremities with rest pain, unspecified extremity: Secondary | ICD-10-CM | POA: Diagnosis not present

## 2023-04-21 DIAGNOSIS — R600 Localized edema: Secondary | ICD-10-CM | POA: Diagnosis not present

## 2023-04-21 DIAGNOSIS — I10 Essential (primary) hypertension: Secondary | ICD-10-CM | POA: Diagnosis not present

## 2023-04-21 DIAGNOSIS — E785 Hyperlipidemia, unspecified: Secondary | ICD-10-CM | POA: Diagnosis not present

## 2023-04-21 MED ORDER — FUROSEMIDE 40 MG PO TABS: ORAL_TABLET | ORAL | 3 refills | Status: DC

## 2023-04-21 NOTE — Patient Instructions (Signed)
 Medication Instructions:  INCREASE LASIX 60MG  FOR 3 DAYS THEN BACK TO 40MG  DAILYU *If you need a refill on your cardiac medications before your next appointment, please call your pharmacy*  Lab Work: NONE  Follow-Up: At [facility], you and your health needs are our priority.  As part of our continuing mission to provide you with exceptional heart care, we have created designated Provider Care Teams.  These Care Teams include your primary Cardiologist (physician) and Advanced Practice Providers (APPs -  Physician Assistants and Nurse Practitioners) who all work together to provide you with the care you need, when you need it.  Your next appointment:   12 month(s)  Provider:   Chilton Si, MD     Other Instructions PLEASE READ AND FOLLOW LOW SODIUM DIET FLUID RESTRICTION LESS THAN 50 OUNCES DAILY(ALL LIQUIDS) NEW LOCATION SEE BELOW.     DASH Eating Plan (LOW SODIUM DIET) DASH stands for Dietary Approaches to Stop Hypertension. The DASH eating plan is a healthy eating plan that has been shown to: Lower high blood pressure (hypertension). Reduce your risk for type 2 diabetes, heart disease, and stroke. Help with weight loss. What are tips for following this plan? Reading food labels Check food labels for the amount of salt (sodium) per serving. Choose foods with less than 5 percent of the Daily Value (DV) of sodium. In general, foods with less than 300 milligrams (mg) of sodium per serving fit into this eating plan. To find whole grains, look for the word "whole" as the first word in the ingredient list. Shopping Buy products labeled as "low-sodium" or "no salt added." Buy fresh foods. Avoid canned foods and pre-made or frozen meals. Cooking Try not to add salt when you cook. Use salt-free seasonings or herbs instead of table salt or sea salt. Check with your health care provider or pharmacist before using salt substitutes. Do not fry foods. Cook foods in healthy ways,  such as baking, boiling, grilling, roasting, or broiling. Cook using oils that are good for your heart. These include olive, canola, avocado, soybean, and sunflower oil. Meal planning  Eat a balanced diet. This should include: 4 or more servings of fruits and 4 or more servings of vegetables each day. Try to fill half of your plate with fruits and vegetables. 6-8 servings of whole grains each day. 6 or less servings of lean meat, poultry, or fish each day. 1 oz is 1 serving. A 3 oz (85 g) serving of meat is about the same size as the palm of your hand. One egg is 1 oz (28 g). 2-3 servings of low-fat dairy each day. One serving is 1 cup (237 mL). 1 serving of nuts, seeds, or beans 5 times each week. 2-3 servings of heart-healthy fats. Healthy fats called omega-3 fatty acids are found in foods such as walnuts, flaxseeds, fortified milks, and eggs. These fats are also found in cold-water fish, such as sardines, salmon, and mackerel. Limit how much you eat of: Canned or prepackaged foods. Food that is high in trans fat, such as fried foods. Food that is high in saturated fat, such as fatty meat. Desserts and other sweets, sugary drinks, and other foods with added sugar. Full-fat dairy products. Do not salt foods before eating. Do not eat more than 4 egg yolks a week. Try to eat at least 2 vegetarian meals a week. Eat more home-cooked food and less restaurant, buffet, and fast food. Lifestyle When eating at a restaurant, ask if your  food can be made with less salt or no salt. If you drink alcohol: Limit how much you have to: 0-1 drink a day if you are male. 0-2 drinks a day if you are male. Know how much alcohol is in your drink. In the [place] one drink is one 12 oz bottle of beer (355 mL), one 5 oz glass of wine (148 mL), or one 1 oz glass of hard liquor (44 mL). General information Avoid eating more than 2,300 mg of salt a day. If you have hypertension, you may need to reduce your sodium  intake to 1,500 mg a day. Work with your provider to stay at a healthy body weight or lose weight. Ask what the best weight range is for you. On most days of the week, get at least 30 minutes of exercise that causes your heart to beat faster. This may include walking, swimming, or biking. Work with your provider or dietitian to adjust your eating plan to meet your specific calorie needs. What foods should I eat? Fruits All fresh, dried, or frozen fruit. Canned fruits that are in their natural juice and do not have sugar added to them. Vegetables Fresh or frozen vegetables that are raw, steamed, roasted, or grilled. Low-sodium or reduced-sodium tomato and vegetable juice. Low-sodium or reduced-sodium tomato sauce and tomato paste. Low-sodium or reduced-sodium canned vegetables. Grains Whole-grain or whole-wheat bread. Whole-grain or whole-wheat pasta. Brown rice. Orpah Cobb. Bulgur. Whole-grain and low-sodium cereals. Pita bread. Low-fat, low-sodium crackers. Whole-wheat flour tortillas. Meats and other proteins Skinless chicken or [place]. Ground chicken or [place]. Pork with fat trimmed off. Fish and seafood. Egg whites. Dried beans, peas, or lentils. Unsalted nuts, nut butters, and seeds. Unsalted canned beans. Lean cuts of beef with fat trimmed off. Low-sodium, lean precooked or cured meat, such as sausages or meat loaves. Dairy Low-fat (1%) or fat-free (skim) milk. Reduced-fat, low-fat, or fat-free cheeses. Nonfat, low-sodium ricotta or cottage cheese. Low-fat or nonfat yogurt. Low-fat, low-sodium cheese. Fats and oils Soft margarine without trans fats. Vegetable oil. Reduced-fat, low-fat, or light mayonnaise and salad dressings (reduced-sodium). Canola, safflower, olive, avocado, soybean, and sunflower oils. Avocado. Seasonings and condiments Herbs. Spices. Seasoning mixes without salt. Other foods Unsalted popcorn and pretzels. Fat-free sweets. The items listed above may not be all the  foods and drinks you can have. Talk to a dietitian to learn more. What foods should I avoid? Fruits Canned fruit in a light or heavy syrup. Fried fruit. Fruit in cream or butter sauce. Vegetables Creamed or fried vegetables. Vegetables in a cheese sauce. Regular canned vegetables that are not marked as low-sodium or reduced-sodium. Regular canned tomato sauce and paste that are not marked as low-sodium or reduced-sodium. Regular tomato and vegetable juices that are not marked as low-sodium or reduced-sodium. Rosita Fire. Olives. Grains Baked goods made with fat, such as croissants, muffins, or some breads. Dry pasta or rice meal packs. Meats and other proteins Fatty cuts of meat. Ribs. Fried meat. Tomasa Blase. Bologna, salami, and other precooked or cured meats, such as sausages or meat loaves, that are not lean and low in sodium. Fat from the back of a pig (fatback). Bratwurst. Salted nuts and seeds. Canned beans with added salt. Canned or smoked fish. Whole eggs or egg yolks. Chicken or [place] with skin. Dairy Whole or 2% milk, cream, and half-and-half. Whole or full-fat cream cheese. Whole-fat or sweetened yogurt. Full-fat cheese. Nondairy creamers. Whipped toppings. Processed cheese and cheese spreads. Fats and oils Butter. Stick margarine. Lard.  Shortening. Ghee. Bacon fat. Tropical oils, such as coconut, palm kernel, or palm oil. Seasonings and condiments Onion salt, garlic salt, seasoned salt, table salt, and sea salt. Worcestershire sauce. Tartar sauce. Barbecue sauce. Teriyaki sauce. Soy sauce, including reduced-sodium soy sauce. Steak sauce. Canned and packaged gravies. Fish sauce. Oyster sauce. Cocktail sauce. Store-bought horseradish. Ketchup. Mustard. Meat flavorings and tenderizers. Bouillon cubes. Hot sauces. Pre-made or packaged marinades. Pre-made or packaged taco seasonings. Relishes. Regular salad dressings. Other foods Salted popcorn and pretzels. The items listed above may not be all the  foods and drinks you should avoid. Talk to a dietitian to learn more. Where to find more information National Heart, Lung, and Blood Institute (NHLBI): BuffaloDryCleaner.gl American Heart Association (AHA): heart.org Academy of Nutrition and Dietetics: eatright.org National Kidney Foundation (NKF): kidney.org This information is not intended to replace advice given to you by your health care provider. Make sure you discuss any questions you have with your health care provider. Document Revised: 02/27/2022 Document Reviewed: 02/27/2022 Elsevier Patient Education  2024 [employer].

## 2023-04-23 ENCOUNTER — Ambulatory Visit: Payer: PPO | Admitting: Internal Medicine

## 2023-04-23 ENCOUNTER — Other Ambulatory Visit: Payer: PPO

## 2023-04-23 NOTE — Progress Notes (Deleted)
 Patient ID: IOAN LANDINI, male   DOB: 11/15/1955, 68 y.o.   MRN: [id number]  HPI: Daniel [name] is a 68 y.o.-year-old male, returning for follow-up for DM2, dx in 2015, insulin-dependent since 2016, uncontrolled, with complications (PAD, foot ulcer, status post right transmetatarsal amputation, stage IIIa CKD, PN). Pt. previously saw Dr. Everardo All, but last visit with me 4 months ago.  Interim history: He has increased urination, no blurry vision, chest pain.  At last visit he had nausea, reflux, and dysphagia.  He was referred to GI.  He is on Protonix. He has dry and watery eyes -he feels that this is related to the CPAP machine. At last visit he was seeing dermatology for a left shin ulcer.  He was on doxycycline and benzoyl peroxide.  This healed.  He also has a Risk analyst in [place].  Reviewed HbA1c: Lab Results  Component Value Date   HGBA1C 6.6 (A) 05/08/2022   HGBA1C 6.4 (A) 01/21/2022   HGBA1C 6.6 (A) 09/19/2021   HGBA1C 7.0 (A) 04/11/2021   HGBA1C 7.2 (A) 01/09/2021   HGBA1C 7.5 (A) 10/08/2020   HGBA1C 6.3 (A) 05/07/2020   HGBA1C 6.2 (A) 08/24/2019   HGBA1C 7.2 (A) 05/11/2019   HGBA1C 7.5 (A) 02/01/2019   Pt is on a regimen of: - Farxiga 10 mg before b'fast - Trulicity 4.5 mg weekly  - Lantus 100 >> 80 >> 100 >> 80 units  in a.m.  - Novolog 20 units in a.m. and 30 units before dinner >>  15 units in a.m. and 25 units before dinner  He is using a CGM:  Prev.:  Previously:   Lowest sugar was 70 >> 50s; he has hypoglycemia awareness <60.  Highest sugar was 400s >> ... 200s.  He drinks 4-5 gallons of 2% milk a week >> trying to cut down.  He was previously drinking whole milk. He sees nutrition. He eats at night, eats oatmeal + brown sugar.  - + CKD, last BUN/creatinine:  Lab Results  Component Value Date   BUN 27 (H) 03/26/2023   BUN 23 04/14/2022   CREATININE 1.63 (H) 03/26/2023   CREATININE 1.53 (H) 04/14/2022   Lab Results  Component Value Date    MICRALBCREAT 3.9 03/26/2023   MICRALBCREAT 14.1 12/19/2021   MICRALBCREAT 30.2 (H) 12/05/2020   MICRALBCREAT 69 (H) 08/25/2019  He is not on an ACE inhibitor/ARB.  -+ HL; last set of lipids: Lab Results  Component Value Date   CHOL 115 03/26/2023   HDL 46.10 03/26/2023   LDLCALC 20 03/26/2023   TRIG 249.0 (H) 03/26/2023   CHOLHDL 3 03/26/2023  He is on Zetia 10 mg daily, Repatha.  He is intolerant to statins due to myalgias.   He does have a history of NASH.  - last eye exam was in 05/29/2022. No DR. Dr. Nile Riggs.  - + numbness and tingling in his feet.  Last foot exam was 12/12/2022.  He is on Lyrica 300 mg twice a day. Sees Dr. Ulice Brilliant - podiatrist in [place].  He does not have a hypothyroidism - this is Rx'ed by his psychiatrist - Dr. [name]. Pt is on levothyroxine 50 mcg daily, taken: - in am - fasting - 15-20 min from b'fast  Reviewed his latest TSH levels: Lab Results  Component Value Date   TSH 0.47 05/07/2020   TSH 0.108 (L) 09/14/2019   TSH 0.736 09/17/2016   TSH 0.19 (L) 07/11/2015   TSH 1.067 06/21/2013   TSH 2.762 01/01/2007  He also has a history of HTN, liver disease, OSA, HIV, ADHD, on chronic opioids.  ROS: + see HPI  Past Medical History:  Diagnosis Date   ADHD (attention deficit hyperactivity disorder)    Anxiety    Chronic kidney disease    Clotting disorder ([name])    Depression    Diabetes mellitus without complication ([name])    Diabetes mellitus, type II ([name])    Dizziness 03/17/2015   Essential hypertension 06/25/2018   GERD (gastroesophageal reflux disease)    HIV disease ([name])    HIV infection ([name])    Liver disease    OSA (obstructive sleep apnea) 07/25/2015   Uses CPAP regularly   Peripheral vascular disease ([name])    Ulcer    Past Surgical History:  Procedure Laterality Date   AMPUTATION Right 10/02/2017   Procedure: RIGHT TRANSMETATARSAL AMPUTATION;  Surgeon: Tarry Kos, MD;  Location: [facility] OR;  Service: Orthopedics;  Laterality:  Right;   SMALL INTESTINE SURGERY     STOMACH SURGERY     TOE AMPUTATION Right 08/2016   right great toe   Social History   Socioeconomic History   Marital status: Single    Spouse name: Not on file   Number of children: Not on file   Years of education: Not on file   Highest education level: 12th grade  Occupational History    Comment: DISABILITY  Tobacco Use   Smoking status: Former    Current packs/day: 0.00    Average packs/day: 0.1 packs/day for 10.0 years (1.0 ttl pk-yrs)    Types: Cigars, Cigarettes    Start date: 08/08/2004    Quit date: 08/09/2014    Years since quitting: 8.7   Smokeless tobacco: Never  Vaping Use   Vaping status: Never Used  Substance and Sexual Activity   Alcohol use: No    Alcohol/week: 0.0 standard drinks of alcohol   Drug use: No   Sexual activity: Not Currently    Partners: Male    Comment: pt. declined condoms  Other Topics Concern   Not on file  Social History Narrative   Epworth Sleepiness Scale = 7 (as of 03/16/2015)   Social Drivers of Health   Financial Resource Strain: Low Risk  (09/20/2022)   Overall Financial Resource Strain (CARDIA)    Difficulty of Paying Living Expenses: Not hard at all  Food Insecurity: No Food Insecurity (09/20/2022)   Hunger Vital Sign    Worried About Running Out of Food in the Last Year: Never true    Ran Out of Food in the Last Year: Never true  Transportation Needs: No Transportation Needs (09/20/2022)   PRAPARE - Administrator, Civil Service (Medical): No    Lack of Transportation (Non-Medical): No  Physical Activity: Unknown (09/20/2022)   Exercise Vital Sign    Days of Exercise per Week: 0 days    Minutes of Exercise per Session: Not on file  Stress: No Stress Concern Present (09/20/2022)   [employer] of Occupational Health - Occupational Stress Questionnaire    Feeling of Stress : Only a little  Social Connections: Socially Isolated (09/20/2022)   Social Connection and  Isolation Panel [NHANES]    Frequency of Communication with Friends and Family: Twice a week    Frequency of Social Gatherings with Friends and Family: Once a week    Attends Religious Services: Never    Database administrator or Organizations: No    Attends Engineer, structural: Not  on file    Marital Status: Never married  Intimate Partner Violence: Not At Risk (10/01/2017)   Humiliation, Afraid, Rape, and Kick questionnaire    Fear of Current or Ex-Partner: No    Emotionally Abused: No    Physically Abused: No    Sexually Abused: No   Current Outpatient Medications on File Prior to Visit  Medication Sig Dispense Refill   acetaminophen (TYLENOL) 500 MG tablet Take 1,500 mg by mouth in the morning and at bedtime.     ALPRAZolam (XANAX) 1 MG tablet Take 1 mg by mouth at bedtime. *May take one tablet up to 4 times daily as needed for anxiety     amphetamine-dextroamphetamine (ADDERALL) 30 MG tablet Take 30 mg by mouth 2 (two) times daily.     Continuous Glucose Sensor (FREESTYLE LIBRE 2 SENSOR) MISC CHECK GLUCOSE CONTINUOUSLY CHANGE SENSOR EVERY 14 DAYS. 6 each 3   Dextromethorphan-buPROPion ER (AUVELITY) 45-105 MG TBCR Take 1 tablet by mouth daily at 12 noon.     diclofenac Sodium (VOLTAREN) 1 % GEL APPLY 2 GM TO EACH KNEE EVERY MORNING AND EVERY NIGHT AT BEDTIME, AND APPLY 1 GM TO EACH KNEE EVERY AFTERNOON 300 g 2   divalproex (DEPAKOTE ER) 500 MG 24 hr tablet TAKE 1 TABLET(500 MG) BY MOUTH AT BEDTIME 90 tablet 1   Dulaglutide (TRULICITY) 4.5 MG/0.5ML SOAJ Inject 4.5 mg into the skin once a week. 6 mL 0   ezetimibe (ZETIA) 10 MG tablet TAKE 1 TABLET(10 MG) BY MOUTH DAILY.  PLEASE CALL [phone] TO SCHEDULE AN OVER DUE FOLLOW APPOINTMENT FOR FURTHER REFILLS. 90 tablet 0   FARXIGA 10 MG TABS tablet TAKE 1 TABLET(10 MG) BY MOUTH DAILY BEFORE BREAKFAST 90 tablet 3   finasteride (PROSCAR) 5 MG tablet Take 5 mg by mouth daily.     furosemide (LASIX) 40 MG tablet TAKE 60MG  FOR 3 DAYS  THEN BACK TO 40MG  DAILY; TAKE 1 TABLET(40 MG) BY MOUTH DAILY 93 tablet 3   insulin glargine (LANTUS SOLOSTAR) 100 UNIT/ML Solostar Pen Inject 80-100 Units into the skin every morning. 105 mL 3   insulin lispro (HUMALOG KWIKPEN) 100 UNIT/ML KwikPen Inject under skin 20 units with breakfast and 30 units with supper.     Insulin Pen Needle (B-D ULTRAFINE III SHORT PEN) 31G X 8 MM MISC USE AS DIRECTED THREE TIMES DAILY 100 each 5   Lemborexant (DAYVIGO) 10 MG TABS Take 10 mg by mouth at bedtime.     levothyroxine (SYNTHROID, LEVOTHROID) 50 MCG tablet Take 1 tablet (50 mcg total) by mouth at bedtime. 30 tablet 11   pantoprazole (PROTONIX) 40 MG tablet Take 1 tablet (40 mg total) by mouth 2 (two) times daily. 180 tablet 1   pregabalin (LYRICA) 300 MG capsule TAKE 1 CAPSULE(300 MG) BY MOUTH TWICE DAILY 60 capsule 0   REPATHA SURECLICK 140 MG/ML SOAJ ADMINISTER 1 ML UNDER THE SKIN EVERY 14 DAYS 2 mL 11   rivaroxaban (XARELTO) 20 MG TABS tablet TAKE 1 TABLET(20 MG) BY MOUTH DAILY WITH SUPPER 90 tablet 2   silodosin (RAPAFLO) 8 MG CAPS capsule Take 8 mg by mouth daily. (Patient not taking: Reported on 04/21/2023)     TRINTELLIX 5 MG TABS tablet Take 5 mg by mouth daily.     TRIUMEQ 600-50-300 MG tablet TAKE 1 TABLET BY MOUTH DAILY 30 tablet 5   No current facility-administered medications on file prior to visit.   Allergies  Allergen Reactions   Aspirin Swelling   Efavirenz  Swelling and Rash    Other reaction(s): anaphylaxis   Ibuprofen Swelling   Nsaids Other (See Comments)    unknwn   Pravastatin     myalgias   Lipitor [Atorvastatin Calcium] Other (See Comments)    Leg pain   Family History  Problem Relation Age of Onset   Depression Brother    Throat cancer Brother        half brother, never smoker   COPD Mother    Diabetes Neg Hx    PE: There were no vitals taken for this visit. Wt Readings from Last 3 Encounters:  04/21/23 253 lb 9.6 oz (115 kg)  03/26/23 249 lb 3.2 oz (113 kg)   12/12/22 252 lb 6.4 oz (114.5 kg)   Constitutional: overweight, in NAD Eyes:  EOMI, no exophthalmos ENT: no neck masses, no cervical lymphadenopathy Cardiovascular: RRR, No MRG Respiratory: CTA B Musculoskeletal: + deformities - R TMA Skin:no rashes, + erythema and 1 cm eschar on the left shin, very slightly weeping.  No fluctuance or purulence.   Neurological: + Resting tremor  ASSESSMENT: 1. DM2, insulin-dependent, uncontrolled, with complications - PAD - foot ulcer, status post right transmetatarsal amputation - stage IIIa CKD - PN  2.  Hyperlipidemia  3.  Acquired hypothyroidism  PLAN:  1. Patient with longstanding, fairly well-controlled type 2 diabetes, on oral antidiabetic regimen with SGLT2 inhibitor along with weekly GLP-1 receptor agonist and basal/bolus insulin regimen with insulin doses decreased at last visit.  At that time sugars were fluctuating mostly within the target range with higher blood sugars in the second half of the night due to staying up late and snacking.  I did advise him to ideally stop snacking but if he could not, to cover the snack with 5-8 units of NovoLog.  HbA1c at that time was excellent, at 5.8%. CGM interpretation: -At today's visit, we reviewed his CGM downloads: It appears that *** of values are in target range (goal >70%), while *** are higher than 180 (goal <25%), and *** are lower than 70 (goal <4%).  The calculated average blood sugar is ***.  The projected HbA1c for the next 3 months (GMI) is ***. -Reviewing the CGM trends, ***  -I advised him to: Patient Instructions  Please continue: - Farxiga 10 mg before b'fast - Trulicity 4.5 mg weekly - Lantus 80 units daily - Novolog 15 minutes before meals: 15 units in a.m. and 25 units before dinner  Try to stop eating at night, but if you have a snack, you may need to cover it with Novolog 5-8 units.  Please return in 4 months.  - we checked his HbA1c: 7%  - advised to check sugars at  different times of the day - 4x a day, rotating check times - advised for yearly eye exams >> he is UTD - return to clinic in 3-4 months  2. HL and 4.  Drug (statin)-induced myopathy -Latest lipid panel showed an excellent LDL with high triglycerides: Lab Results  Component Value Date   CHOL 115 03/26/2023   HDL 46.10 03/26/2023   LDLCALC 20 03/26/2023   TRIG 249.0 (H) 03/26/2023   CHOLHDL 3 03/26/2023  -Continues on Zetia 10 mg daily and Repatha, without side effects.  Could not tolerate statins due to myalgias.  3.  Thyroid hormone use -He was started on thyroid hormones by his psychiatrist, without a diagnosis of hypothyroidism, apparently for symptom control -TSH was normal: Lab Results  Component Value Date   TSH  0.47 05/07/2020  -Continues levothyroxine 50 mcg daily -Management per psychiatry  Carlus Pavlov, MD PhD [facility] Endocrinology

## 2023-04-29 ENCOUNTER — Other Ambulatory Visit: Payer: Self-pay

## 2023-04-29 ENCOUNTER — Other Ambulatory Visit (HOSPITAL_COMMUNITY)
Admission: RE | Admit: 2023-04-29 | Discharge: 2023-04-29 | Disposition: A | Discharge status: Home / Self Care | Source: Ambulatory Visit | Attending: Internal Medicine | Admitting: Internal Medicine

## 2023-04-29 ENCOUNTER — Ambulatory Visit: Payer: PPO | Admitting: Internal Medicine

## 2023-04-29 ENCOUNTER — Encounter: Payer: Self-pay | Admitting: Internal Medicine

## 2023-04-29 VITALS — BP 125/74 | HR 91 | Temp 97.2°F | Ht 70.0 in | Wt 254.4 lb

## 2023-04-29 DIAGNOSIS — Z113 Encounter for screening for infections with a predominantly sexual mode of transmission: Secondary | ICD-10-CM | POA: Insufficient documentation

## 2023-04-29 DIAGNOSIS — B20 Human immunodeficiency virus [HIV] disease: Secondary | ICD-10-CM | POA: Diagnosis not present

## 2023-04-29 DIAGNOSIS — Z21 Asymptomatic human immunodeficiency virus [HIV] infection status: Secondary | ICD-10-CM

## 2023-04-29 MED ORDER — TRIUMEQ 600-50-300 MG PO TABS
1.0000 | ORAL_TABLET | Freq: Every day | ORAL | 11 refills | Status: DC
Start: 2023-04-29 — End: 2023-11-26

## 2023-04-29 NOTE — Assessment & Plan Note (Signed)
 Do routine screening today with urine and blood

## 2023-04-29 NOTE — Assessment & Plan Note (Signed)
 He is doing well on Triumeq and no changes indicated.  Will check his labs today to assure good viral suppression.  Otherwise he can return in 6 months  I have personally spent 30 minutes involved in face-to-face and non-face-to-face activities for this patient on the day of the visit. Professional time spent includes the following activities: Preparing to see the patient (review of tests), Obtaining and/or reviewing separately obtained history (admission/discharge record), Performing a medically appropriate examination and/or evaluation , Ordering medications/tests/procedures, referring and communicating with other health care professionals, Documenting clinical information in the EMR, Independently interpreting results (not separately reported), Communicating results to the patient/family/caregiver, Counseling and educating the patient/family/caregiver and Care coordination (not separately reported).

## 2023-04-29 NOTE — Progress Notes (Signed)
   Subjective:    Patient ID: Daniel [name], male    DOB: Oct 30, 1955, 68 y.o.   MRN: [id number]  HPI Daniel [name] year for follow-up of HIV Continues on Triumeq and denies any missed doses.  He is doing well with no issues getting or taking his medication.  He has had some bouts with depression and poor self-care though was back on track now and started new medication from psychiatry.  He has no complaints today   Review of Systems  Constitutional:  Negative for fatigue.  Gastrointestinal:  Negative for diarrhea.  Skin:  Negative for rash.       Objective:   Physical Exam Eyes:     General: No scleral icterus. Pulmonary:     Effort: Pulmonary effort is normal.  Neurological:     Mental Status: He is alert.   SH: no tobacco        Assessment & Plan:

## 2023-04-30 ENCOUNTER — Telehealth: Payer: Self-pay

## 2023-04-30 ENCOUNTER — Other Ambulatory Visit (HOSPITAL_COMMUNITY): Payer: Self-pay

## 2023-04-30 LAB — T-HELPER CELL (CD4) - (RCID CLINIC ONLY)
CD4 % Helper T Cell: 34 % (ref 33–65)
CD4 T Cell Abs: 791 /uL (ref 400–1790)

## 2023-04-30 NOTE — Telephone Encounter (Signed)
 Pt needs a PA for Trulicity 4.5 mg and is currently out of medication.

## 2023-04-30 NOTE — Telephone Encounter (Signed)
 Pharmacy Patient Advocate Encounter   Received notification from Pt Calls Messages that prior authorization for Trulicity is required/requested.   Insurance verification completed.   The patient is insured through [facility] ADVANTAGE/RX ADVANCE .   Per test claim: PA required; PA submitted to above mentioned insurance via CoverMyMeds Key/confirmation #/EOC [id number] Status is pending

## 2023-05-01 LAB — URINE CYTOLOGY ANCILLARY ONLY
Chlamydia: NEGATIVE
Comment: NEGATIVE
Comment: NORMAL
Neisseria Gonorrhea: NEGATIVE

## 2023-05-01 NOTE — Telephone Encounter (Signed)
 PA Status: Outcome Approved on March 6 by [employer] 2017 06-MAR-25:06-MAR-26 Trulicity 4.5MG /0.5ML [place] SOAJ Quantity:2;

## 2023-05-01 NOTE — Telephone Encounter (Signed)
 Pharmacy Patient Advocate Encounter  Received notification from [facility] ADVANTAGE/RX ADVANCE that Prior Authorization for Trulicity 4.5MG /0.5ML auto-injectors has been APPROVED from 04-30-2023 to 04-29-2024   PA #/Case ID/Reference #: [id number]

## 2023-05-02 LAB — HIV-1 RNA QUANT-NO REFLEX-BLD
HIV 1 RNA Quant: 50 {copies}/mL — ABNORMAL HIGH
HIV-1 RNA Quant, Log: 1.7 {Log_copies}/mL — ABNORMAL HIGH

## 2023-05-02 LAB — RPR: RPR Ser Ql: NONREACTIVE

## 2023-05-06 DIAGNOSIS — N179 Acute kidney failure, unspecified: Secondary | ICD-10-CM | POA: Diagnosis not present

## 2023-05-06 DIAGNOSIS — E876 Hypokalemia: Secondary | ICD-10-CM | POA: Diagnosis not present

## 2023-05-06 DIAGNOSIS — R55 Syncope and collapse: Secondary | ICD-10-CM | POA: Diagnosis not present

## 2023-05-06 DIAGNOSIS — I10 Essential (primary) hypertension: Secondary | ICD-10-CM | POA: Diagnosis not present

## 2023-05-06 DIAGNOSIS — B20 Human immunodeficiency virus [HIV] disease: Secondary | ICD-10-CM | POA: Diagnosis not present

## 2023-05-06 DIAGNOSIS — Z6836 Body mass index (BMI) 36.0-36.9, adult: Secondary | ICD-10-CM | POA: Diagnosis not present

## 2023-05-06 DIAGNOSIS — R399 Unspecified symptoms and signs involving the genitourinary system: Secondary | ICD-10-CM | POA: Diagnosis not present

## 2023-05-06 DIAGNOSIS — Z794 Long term (current) use of insulin: Secondary | ICD-10-CM | POA: Diagnosis not present

## 2023-05-06 DIAGNOSIS — E0849 Diabetes mellitus due to underlying condition with other diabetic neurological complication: Secondary | ICD-10-CM | POA: Diagnosis not present

## 2023-05-14 DIAGNOSIS — R351 Nocturia: Secondary | ICD-10-CM | POA: Diagnosis not present

## 2023-05-14 DIAGNOSIS — R350 Frequency of micturition: Secondary | ICD-10-CM | POA: Diagnosis not present

## 2023-05-14 DIAGNOSIS — N401 Enlarged prostate with lower urinary tract symptoms: Secondary | ICD-10-CM | POA: Diagnosis not present

## 2023-05-14 NOTE — Progress Notes (Signed)
 Setup date: 07/13/15

## 2023-05-17 NOTE — Progress Notes (Unsigned)
 PATIENT: Daniel [name] DOB: 1956/02/08  REASON FOR VISIT: follow up HISTORY FROM: patient PRIMARY NEUROLOGIST: Dr. Vickey Huger  No chief complaint on file.    HISTORY OF PRESENT ILLNESS: Today 05/17/23:  Daniel [name] is a 68 y.o. male with a history of ***. Returns today for follow-up.      05/14/22: Daniel [name] is a 68 y.o. male with a history of obstructive sleep apnea on CPAP. Returns today for follow-up.  Patient reports that he got a new machine last year but unfortunately it has broken.  He has been using an old machine.  The machine is working well but it does to say end of motor life.  The patient would like to get a new machine if insurance will cover.  His download is below.  This download is from his old machine.       HISTORY 08/29/20:   Daniel [name] is a 68 year old male with a history of obstructive sleep apnea on CPAP.  He returns today for follow-up.  He states that he has not been using his CPAP for the last month.  He reports that he is in the process of moving and just has not been using it.  He does state that when he was using it he felt that he was suffocating.  He feels that the initial pressure needs to be higher.  Below is his download from a month ago.       REVIEW OF SYSTEMS: Out of a complete 14 system review of symptoms, the patient complains only of the following symptoms, and all other reviewed systems are negative.  FSS ESS  ALLERGIES: Allergies  Allergen Reactions   Aspirin Swelling   Efavirenz Swelling and Rash    Other reaction(s): anaphylaxis   Ibuprofen Swelling   Nsaids Other (See Comments)    unknwn   Pravastatin     myalgias   Lipitor [Atorvastatin Calcium] Other (See Comments)    Leg pain    HOME MEDICATIONS: Outpatient Medications Prior to Visit  Medication Sig Dispense Refill   abacavir-dolutegravir-lamiVUDine (TRIUMEQ) 600-50-300 MG tablet Take 1 tablet by mouth daily. 30 tablet 11   acetaminophen (TYLENOL) 500  MG tablet Take 1,500 mg by mouth in the morning and at bedtime.     ALPRAZolam (XANAX) 1 MG tablet Take 1 mg by mouth at bedtime. *May take one tablet up to 4 times daily as needed for anxiety     amphetamine-dextroamphetamine (ADDERALL) 30 MG tablet Take 30 mg by mouth 2 (two) times daily.     Continuous Glucose Sensor (FREESTYLE LIBRE 2 SENSOR) MISC CHECK GLUCOSE CONTINUOUSLY CHANGE SENSOR EVERY 14 DAYS. 6 each 3   Dextromethorphan-buPROPion ER (AUVELITY) 45-105 MG TBCR Take 1 tablet by mouth daily at 12 noon.     diclofenac Sodium (VOLTAREN) 1 % GEL APPLY 2 GM TO EACH KNEE EVERY MORNING AND EVERY NIGHT AT BEDTIME, AND APPLY 1 GM TO EACH KNEE EVERY AFTERNOON 300 g 2   divalproex (DEPAKOTE ER) 500 MG 24 hr tablet TAKE 1 TABLET(500 MG) BY MOUTH AT BEDTIME 90 tablet 1   Dulaglutide (TRULICITY) 4.5 MG/0.5ML SOAJ Inject 4.5 mg into the skin once a week. 6 mL 0   ezetimibe (ZETIA) 10 MG tablet TAKE 1 TABLET(10 MG) BY MOUTH DAILY.  PLEASE CALL [phone] TO SCHEDULE AN OVER DUE FOLLOW APPOINTMENT FOR FURTHER REFILLS. 90 tablet 0   FARXIGA 10 MG TABS tablet TAKE 1 TABLET(10 MG) BY MOUTH DAILY BEFORE BREAKFAST 90  tablet 3   finasteride (PROSCAR) 5 MG tablet Take 5 mg by mouth daily.     furosemide (LASIX) 40 MG tablet TAKE 60MG  FOR 3 DAYS THEN BACK TO 40MG  DAILY; TAKE 1 TABLET(40 MG) BY MOUTH DAILY 93 tablet 3   insulin glargine (LANTUS SOLOSTAR) 100 UNIT/ML Solostar Pen Inject 80-100 Units into the skin every morning. 105 mL 3   insulin lispro (HUMALOG KWIKPEN) 100 UNIT/ML KwikPen Inject under skin 20 units with breakfast and 30 units with supper.     Insulin Pen Needle (B-D ULTRAFINE III SHORT PEN) 31G X 8 MM MISC USE AS DIRECTED THREE TIMES DAILY 100 each 5   Lemborexant (DAYVIGO) 10 MG TABS Take 10 mg by mouth at bedtime.     levothyroxine (SYNTHROID, LEVOTHROID) 50 MCG tablet Take 1 tablet (50 mcg total) by mouth at bedtime. 30 tablet 11   pantoprazole (PROTONIX) 40 MG tablet Take 1 tablet (40 mg  total) by mouth 2 (two) times daily. 180 tablet 1   pregabalin (LYRICA) 300 MG capsule TAKE 1 CAPSULE(300 MG) BY MOUTH TWICE DAILY 60 capsule 0   REPATHA SURECLICK 140 MG/ML SOAJ ADMINISTER 1 ML UNDER THE SKIN EVERY 14 DAYS 2 mL 11   rivaroxaban (XARELTO) 20 MG TABS tablet TAKE 1 TABLET(20 MG) BY MOUTH DAILY WITH SUPPER 90 tablet 2   silodosin (RAPAFLO) 8 MG CAPS capsule Take 8 mg by mouth daily. (Patient not taking: Reported on 03/26/2023)     TRINTELLIX 5 MG TABS tablet Take 5 mg by mouth daily.     No facility-administered medications prior to visit.    PAST MEDICAL HISTORY: Past Medical History:  Diagnosis Date   ADHD (attention deficit hyperactivity disorder)    Anxiety    Chronic kidney disease    Clotting disorder ([name])    Depression    Diabetes mellitus without complication ([name])    Diabetes mellitus, type II ([name])    Dizziness 03/17/2015   Essential hypertension 06/25/2018   GERD (gastroesophageal reflux disease)    HIV disease ([name])    HIV infection ([name])    Liver disease    OSA (obstructive sleep apnea) 07/25/2015   Uses CPAP regularly   Peripheral vascular disease ([name])    Ulcer     PAST SURGICAL HISTORY: Past Surgical History:  Procedure Laterality Date   AMPUTATION Right 10/02/2017   Procedure: RIGHT TRANSMETATARSAL AMPUTATION;  Surgeon: Tarry Kos, MD;  Location: [facility] OR;  Service: Orthopedics;  Laterality: Right;   SMALL INTESTINE SURGERY     STOMACH SURGERY     TOE AMPUTATION Right 08/2016   right great toe    FAMILY HISTORY: Family History  Problem Relation Age of Onset   Depression Brother    Throat cancer Brother        half brother, never smoker   COPD Mother    Diabetes Neg Hx     SOCIAL HISTORY: Social History   Socioeconomic History   Marital status: Single    Spouse name: Not on file   Number of children: Not on file   Years of education: Not on file   Highest education level: 12th grade  Occupational History    Comment: DISABILITY   Tobacco Use   Smoking status: Former    Current packs/day: 0.00    Average packs/day: 0.1 packs/day for 10.0 years (1.0 ttl pk-yrs)    Types: Cigars, Cigarettes    Start date: 08/08/2004    Quit date: 08/09/2014    Years  since quitting: 8.7   Smokeless tobacco: Never  Vaping Use   Vaping status: Never Used  Substance and Sexual Activity   Alcohol use: No    Alcohol/week: 0.0 standard drinks of alcohol   Drug use: No   Sexual activity: Not Currently    Partners: Male    Comment: pt. declined condoms  Other Topics Concern   Not on file  Social History Narrative   Epworth Sleepiness Scale = 7 (as of 03/16/2015)   Social Drivers of Health   Financial Resource Strain: Low Risk  (09/20/2022)   Overall Financial Resource Strain (CARDIA)    Difficulty of Paying Living Expenses: Not hard at all  Food Insecurity: No Food Insecurity (09/20/2022)   Hunger Vital Sign    Worried About Running Out of Food in the Last Year: Never true    Ran Out of Food in the Last Year: Never true  Transportation Needs: No Transportation Needs (09/20/2022)   PRAPARE - Administrator, Civil Service (Medical): No    Lack of Transportation (Non-Medical): No  Physical Activity: Unknown (09/20/2022)   Exercise Vital Sign    Days of Exercise per Week: 0 days    Minutes of Exercise per Session: Not on file  Stress: No Stress Concern Present (09/20/2022)   [employer] of Occupational Health - Occupational Stress Questionnaire    Feeling of Stress : Only a little  Social Connections: Socially Isolated (09/20/2022)   Social Connection and Isolation Panel [NHANES]    Frequency of Communication with Friends and Family: Twice a week    Frequency of Social Gatherings with Friends and Family: Once a week    Attends Religious Services: Never    Database administrator or Organizations: No    Attends Engineer, structural: Not on file    Marital Status: Never married  Intimate Partner Violence:  Not At Risk (10/01/2017)   Humiliation, Afraid, Rape, and Kick questionnaire    Fear of Current or Ex-Partner: No    Emotionally Abused: No    Physically Abused: No    Sexually Abused: No      PHYSICAL EXAM  There were no vitals filed for this visit.  There is no height or weight on file to calculate BMI.  Generalized: Well developed, in no acute distress  Chest: Lungs clear to auscultation bilaterally  Neurological examination  Mentation: Alert oriented to time, place, history taking. Follows all commands speech and language fluent Cranial nerve II-XII: Facial symmetry noted Gait and station: Gait is normal.    DIAGNOSTIC DATA (LABS, IMAGING, TESTING) - I reviewed patient records, labs, notes, testing and imaging myself where available.  Lab Results  Component Value Date   WBC 7.0 04/14/2022   HGB 17.7 (H) 04/14/2022   HCT 50.0 04/14/2022   MCV 94.2 04/14/2022   PLT 146 04/14/2022      Component Value Date/Time   NA 142 03/26/2023 1639   NA 144 10/08/2021 1443   K 4.3 03/26/2023 1639   CL 102 03/26/2023 1639   CO2 33 (H) 03/26/2023 1639   GLUCOSE 82 03/26/2023 1639   BUN 27 (H) 03/26/2023 1639   BUN 21 10/08/2021 1443   CREATININE 1.63 (H) 03/26/2023 1639   CREATININE 1.53 (H) 04/14/2022 1529   CALCIUM 9.4 03/26/2023 1639   PROT 7.5 03/26/2023 1639   PROT 7.6 10/08/2021 1443   ALBUMIN 4.5 03/26/2023 1639   ALBUMIN 4.7 10/08/2021 1443   AST 31 03/26/2023 1639  ALT 36 03/26/2023 1639   ALKPHOS 93 03/26/2023 1639   BILITOT 0.9 03/26/2023 1639   BILITOT 0.8 10/08/2021 1443   GFRNONAA 46 (L) 01/04/2020 1546   GFRNONAA 41 (L) 09/13/2019 1714   GFRAA 53 (L) 01/04/2020 1546   GFRAA 48 (L) 09/13/2019 1714   Lab Results  Component Value Date   CHOL 115 03/26/2023   HDL 46.10 03/26/2023   LDLCALC 20 03/26/2023   TRIG 249.0 (H) 03/26/2023   CHOLHDL 3 03/26/2023   Lab Results  Component Value Date   HGBA1C 5.8 12/12/2022   Lab Results  Component Value  Date   VITAMINB12 596 03/26/2023   Lab Results  Component Value Date   TSH 0.47 05/07/2020      ASSESSMENT AND PLAN 68 y.o. year old male  has a past medical history of ADHD (attention deficit hyperactivity disorder), Anxiety, Chronic kidney disease, Clotting disorder ([name]), Depression, Diabetes mellitus without complication ([name]), Diabetes mellitus, type II ([name]), Dizziness (03/17/2015), Essential hypertension (06/25/2018), GERD (gastroesophageal reflux disease), HIV disease ([name]), HIV infection ([name]), Liver disease, OSA (obstructive sleep apnea) (07/25/2015), Peripheral vascular disease ([name]), and Ulcer. here with:  OSA on CPAP  -We will reach out to his DME company to see if he can get a new machine or if that 1 can be fixed. - Good treatment of AHI  - Encourage patient to use CPAP nightly and > 4 hours each night - F/U in 1 year or sooner if needed   .  Butch Penny, MSN, NP-C 05/17/2023, 8:46 PM [facility] [address], [address] [place], [place] [address] [phone]

## 2023-05-18 ENCOUNTER — Telehealth: Payer: PPO | Admitting: Adult Health

## 2023-05-18 ENCOUNTER — Telehealth: Payer: Self-pay | Admitting: Adult Health

## 2023-05-18 DIAGNOSIS — G4733 Obstructive sleep apnea (adult) (pediatric): Secondary | ICD-10-CM

## 2023-05-18 NOTE — Telephone Encounter (Signed)
 ..  Pt understands that although there may be some limitations with this type of visit, we will take all precautions to reduce any security or privacy concerns.  Pt understands that this will be treated like an in office visit and we will file with pt's insurance, and there may be a patient responsible charge related to this service. ? ?

## 2023-05-18 NOTE — Progress Notes (Signed)
 PATIENT: Daniel [name] [facility] DOB: [birth year]  REASON FOR VISIT: follow up HISTORY FROM: patient PRIMARY NEUROLOGIST: Dr. Vickey Huger   Virtual Visit via Video Note  I connected with Daniel [name] on 05/18/23 at  2:00 PM EDT by a video enabled telemedicine application located remotely at [facility] and verified that I am speaking with the correct person using two identifiers who was located at their own home.   I discussed the limitations of evaluation and management by telemedicine and the availability of in person appointments. The patient expressed understanding and agreed to proceed.   PATIENT: Daniel [name] [facility] DOB: 03/24/1955  REASON FOR VISIT: follow up HISTORY FROM: patient  HISTORY OF PRESENT ILLNESS: Today 05/18/23:  Daniel [name] is a 68 y.o. male with a history of OSA on CPAP. Returns today for follow-up. Reports that CPAP is working well for him.  He states he still using an old machine because he cannot afford to have the new one fixed.  He states that he has enough supplies to change them out  as of right now.  His download is below     HISTORY   REVIEW OF SYSTEMS: Out of a complete 14 system review of symptoms, the patient complains only of the following symptoms, and all other reviewed systems are negative.  ALLERGIES: Allergies  Allergen Reactions   Aspirin Swelling   Efavirenz Swelling and Rash    Other reaction(s): anaphylaxis   Ibuprofen Swelling   Nsaids Other (See Comments)    unknwn   Pravastatin     myalgias   Lipitor [Atorvastatin Calcium] Other (See Comments)    Leg pain    HOME MEDICATIONS: Outpatient Medications Prior to Visit  Medication Sig Dispense Refill   abacavir-dolutegravir-lamiVUDine (TRIUMEQ) 600-50-300 MG tablet Take 1 tablet by mouth daily. 30 tablet 11   acetaminophen (TYLENOL) 500 MG tablet Take 1,500 mg by mouth in the morning and at bedtime.     ALPRAZolam (XANAX) 1 MG tablet Take 1 mg by mouth at  bedtime. *May take one tablet up to 4 times daily as needed for anxiety     amphetamine-dextroamphetamine (ADDERALL) 30 MG tablet Take 30 mg by mouth 2 (two) times daily.     Continuous Glucose Sensor (FREESTYLE LIBRE 2 SENSOR) MISC CHECK GLUCOSE CONTINUOUSLY CHANGE SENSOR EVERY 14 DAYS. 6 each 3   Dextromethorphan-buPROPion ER (AUVELITY) 45-105 MG TBCR Take 1 tablet by mouth daily at 12 noon.     diclofenac Sodium (VOLTAREN) 1 % GEL APPLY 2 GM TO EACH KNEE EVERY MORNING AND EVERY NIGHT AT BEDTIME, AND APPLY 1 GM TO EACH KNEE EVERY AFTERNOON 300 g 2   divalproex (DEPAKOTE ER) 500 MG 24 hr tablet TAKE 1 TABLET(500 MG) BY MOUTH AT BEDTIME 90 tablet 1   Dulaglutide (TRULICITY) 4.5 MG/0.5ML SOAJ Inject 4.5 mg into the skin once a week. 6 mL 0   ezetimibe (ZETIA) 10 MG tablet TAKE 1 TABLET(10 MG) BY MOUTH DAILY.  PLEASE CALL [phone] TO SCHEDULE AN OVER DUE FOLLOW APPOINTMENT FOR FURTHER REFILLS. 90 tablet 0   FARXIGA 10 MG TABS tablet TAKE 1 TABLET(10 MG) BY MOUTH DAILY BEFORE BREAKFAST 90 tablet 3   finasteride (PROSCAR) 5 MG tablet Take 5 mg by mouth daily.     furosemide (LASIX) 40 MG tablet TAKE 60MG  FOR 3 DAYS THEN BACK TO 40MG  DAILY; TAKE 1 TABLET(40 MG) BY MOUTH DAILY 93 tablet 3   insulin glargine (LANTUS SOLOSTAR) 100 UNIT/ML Solostar Pen  Inject 80-100 Units into the skin every morning. 105 mL 3   insulin lispro (HUMALOG KWIKPEN) 100 UNIT/ML KwikPen Inject under skin 20 units with breakfast and 30 units with supper.     Insulin Pen Needle (B-D ULTRAFINE III SHORT PEN) 31G X 8 MM MISC USE AS DIRECTED THREE TIMES DAILY 100 each 5   Lemborexant (DAYVIGO) 10 MG TABS Take 10 mg by mouth at bedtime.     levothyroxine (SYNTHROID, LEVOTHROID) 50 MCG tablet Take 1 tablet (50 mcg total) by mouth at bedtime. 30 tablet 11   pantoprazole (PROTONIX) 40 MG tablet Take 1 tablet (40 mg total) by mouth 2 (two) times daily. 180 tablet 1   pregabalin (LYRICA) 300 MG capsule TAKE 1 CAPSULE(300 MG) BY MOUTH  TWICE DAILY 60 capsule 0   REPATHA SURECLICK 140 MG/ML SOAJ ADMINISTER 1 ML UNDER THE SKIN EVERY 14 DAYS 2 mL 11   rivaroxaban (XARELTO) 20 MG TABS tablet TAKE 1 TABLET(20 MG) BY MOUTH DAILY WITH SUPPER 90 tablet 2   silodosin (RAPAFLO) 8 MG CAPS capsule Take 8 mg by mouth daily. (Patient not taking: Reported on 03/26/2023)     TRINTELLIX 5 MG TABS tablet Take 5 mg by mouth daily.     No facility-administered medications prior to visit.    PAST MEDICAL HISTORY: Past Medical History:  Diagnosis Date   ADHD (attention deficit hyperactivity disorder)    Anxiety    Chronic kidney disease    Clotting disorder ([name])    Depression    Diabetes mellitus without complication ([name])    Diabetes mellitus, type II ([name])    Dizziness 03/17/2015   Essential hypertension 06/25/2018   GERD (gastroesophageal reflux disease)    HIV disease ([name])    HIV infection ([name])    Liver disease    OSA (obstructive sleep apnea) 07/25/2015   Uses CPAP regularly   Peripheral vascular disease ([name])    Ulcer     PAST SURGICAL HISTORY: Past Surgical History:  Procedure Laterality Date   AMPUTATION Right 10/02/2017   Procedure: RIGHT TRANSMETATARSAL AMPUTATION;  Surgeon: Tarry Kos, MD;  Location: [facility] OR;  Service: Orthopedics;  Laterality: Right;   SMALL INTESTINE SURGERY     STOMACH SURGERY     TOE AMPUTATION Right 08/2016   right great toe    FAMILY HISTORY: Family History  Problem Relation Age of Onset   Depression Brother    Throat cancer Brother        half brother, never smoker   COPD Mother    Diabetes Neg Hx     SOCIAL HISTORY: Social History   Socioeconomic History   Marital status: Single    Spouse name: Not on file   Number of children: Not on file   Years of education: Not on file   Highest education level: 12th grade  Occupational History    Comment: DISABILITY  Tobacco Use   Smoking status: Former    Current packs/day: 0.00    Average packs/day: 0.1 packs/day for 10.0 years  (1.0 ttl pk-yrs)    Types: Cigars, Cigarettes    Start date: 08/08/2004    Quit date: 08/09/2014    Years since quitting: 8.7   Smokeless tobacco: Never  Vaping Use   Vaping status: Never Used  Substance and Sexual Activity   Alcohol use: No    Alcohol/week: 0.0 standard drinks of alcohol   Drug use: No   Sexual activity: Not Currently    Partners: Male  Comment: pt. declined condoms  Other Topics Concern   Not on file  Social History Narrative   Epworth Sleepiness Scale = 7 (as of 03/16/2015)   Social Drivers of Health   Financial Resource Strain: Low Risk  (09/20/2022)   Overall Financial Resource Strain (CARDIA)    Difficulty of Paying Living Expenses: Not hard at all  Food Insecurity: No Food Insecurity (09/20/2022)   Hunger Vital Sign    Worried About Running Out of Food in the Last Year: Never true    Ran Out of Food in the Last Year: Never true  Transportation Needs: No Transportation Needs (09/20/2022)   PRAPARE - Administrator, Civil Service (Medical): No    Lack of Transportation (Non-Medical): No  Physical Activity: Unknown (09/20/2022)   Exercise Vital Sign    Days of Exercise per Week: 0 days    Minutes of Exercise per Session: Not on file  Stress: No Stress Concern Present (09/20/2022)   [employer] of Occupational Health - Occupational Stress Questionnaire    Feeling of Stress : Only a little  Social Connections: Socially Isolated (09/20/2022)   Social Connection and Isolation Panel [NHANES]    Frequency of Communication with Friends and Family: Twice a week    Frequency of Social Gatherings with Friends and Family: Once a week    Attends Religious Services: Never    Database administrator or Organizations: No    Attends Engineer, structural: Not on file    Marital Status: Never married  Intimate Partner Violence: Not At Risk (10/01/2017)   Humiliation, Afraid, Rape, and Kick questionnaire    Fear of Current or Ex-Partner: No     Emotionally Abused: No    Physically Abused: No    Sexually Abused: No      PHYSICAL EXAM Generalized: Well developed, in no acute distress   Neurological examination  Mentation: Alert oriented to time, place, history taking. Follows all commands speech and language fluent Cranial nerve II-XII: Facial symmetry noted.   DIAGNOSTIC DATA (LABS, IMAGING, TESTING) - I reviewed patient records, labs, notes, testing and imaging myself where available.  Lab Results  Component Value Date   WBC 7.0 04/14/2022   HGB 17.7 (H) 04/14/2022   HCT 50.0 04/14/2022   MCV 94.2 04/14/2022   PLT 146 04/14/2022      Component Value Date/Time   NA 142 03/26/2023 1639   NA 144 10/08/2021 1443   K 4.3 03/26/2023 1639   CL 102 03/26/2023 1639   CO2 33 (H) 03/26/2023 1639   GLUCOSE 82 03/26/2023 1639   BUN 27 (H) 03/26/2023 1639   BUN 21 10/08/2021 1443   CREATININE 1.63 (H) 03/26/2023 1639   CREATININE 1.53 (H) 04/14/2022 1529   CALCIUM 9.4 03/26/2023 1639   PROT 7.5 03/26/2023 1639   PROT 7.6 10/08/2021 1443   ALBUMIN 4.5 03/26/2023 1639   ALBUMIN 4.7 10/08/2021 1443   AST 31 03/26/2023 1639   ALT 36 03/26/2023 1639   ALKPHOS 93 03/26/2023 1639   BILITOT 0.9 03/26/2023 1639   BILITOT 0.8 10/08/2021 1443   GFRNONAA 46 (L) 01/04/2020 1546   GFRNONAA 41 (L) 09/13/2019 1714   GFRAA 53 (L) 01/04/2020 1546   GFRAA 48 (L) 09/13/2019 1714   Lab Results  Component Value Date   CHOL 115 03/26/2023   HDL 46.10 03/26/2023   LDLCALC 20 03/26/2023   TRIG 249.0 (H) 03/26/2023   CHOLHDL 3 03/26/2023   Lab Results  Component Value Date   HGBA1C 5.8 12/12/2022   Lab Results  Component Value Date   VITAMINB12 596 03/26/2023   Lab Results  Component Value Date   TSH 0.47 05/07/2020      ASSESSMENT AND PLAN 68 y.o. year old male  has a past medical history of ADHD (attention deficit hyperactivity disorder), Anxiety, Chronic kidney disease, Clotting disorder ([name]), Depression, Diabetes  mellitus without complication ([name]), Diabetes mellitus, type II ([name]), Dizziness (03/17/2015), Essential hypertension (06/25/2018), GERD (gastroesophageal reflux disease), HIV disease ([name]), HIV infection ([name]), Liver disease, OSA (obstructive sleep apnea) (07/25/2015), Peripheral vascular disease ([name]), and Ulcer. here with:  OSA on CPAP  CPAP compliance excellent Residual AHI is good Encouraged patient to continue using CPAP nightly and > 4 hours each night F/U in 1 year or sooner if needed  Butch Penny, MSN, NP-C 05/18/2023, 2:12 PM [facility] [address] [place], [place] [address] [phone]

## 2023-05-18 NOTE — Telephone Encounter (Deleted)
 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 I just tried to call him twice and it went straight to VM. I LVM for him. I also sent a MyChart message. I also called [facility] and LVM asking if they have a recent DL that we cannot see on airview.

## 2023-05-18 NOTE — Telephone Encounter (Signed)
 noted

## 2023-05-19 ENCOUNTER — Other Ambulatory Visit: Payer: Self-pay | Admitting: Family Medicine

## 2023-05-19 DIAGNOSIS — E1142 Type 2 diabetes mellitus with diabetic polyneuropathy: Secondary | ICD-10-CM

## 2023-05-19 DIAGNOSIS — M792 Neuralgia and neuritis, unspecified: Secondary | ICD-10-CM

## 2023-05-20 NOTE — Telephone Encounter (Signed)
 Controlled substance database reviewed.  Pregabalin 300 mg #60 last filled on 04/15/2023.  Office visit January 30.  Medication discussed at that time.  Refill ordered.

## 2023-05-29 ENCOUNTER — Other Ambulatory Visit: Payer: Self-pay | Admitting: Family Medicine

## 2023-05-29 DIAGNOSIS — M792 Neuralgia and neuritis, unspecified: Secondary | ICD-10-CM

## 2023-05-29 DIAGNOSIS — E1142 Type 2 diabetes mellitus with diabetic polyneuropathy: Secondary | ICD-10-CM

## 2023-06-03 DIAGNOSIS — I872 Venous insufficiency (chronic) (peripheral): Secondary | ICD-10-CM | POA: Diagnosis not present

## 2023-06-23 ENCOUNTER — Ambulatory Visit: Payer: PPO | Admitting: Internal Medicine

## 2023-06-23 ENCOUNTER — Other Ambulatory Visit: Payer: Self-pay | Admitting: Family Medicine

## 2023-06-23 ENCOUNTER — Other Ambulatory Visit: Payer: Self-pay | Admitting: Cardiovascular Disease

## 2023-06-23 DIAGNOSIS — E785 Hyperlipidemia, unspecified: Secondary | ICD-10-CM

## 2023-06-23 DIAGNOSIS — M792 Neuralgia and neuritis, unspecified: Secondary | ICD-10-CM

## 2023-06-23 DIAGNOSIS — E1142 Type 2 diabetes mellitus with diabetic polyneuropathy: Secondary | ICD-10-CM

## 2023-06-23 NOTE — Telephone Encounter (Signed)
 Requested Prescriptions   Pending Prescriptions Disp Refills   pregabalin  (LYRICA ) 300 MG capsule [Pharmacy Med Name: PREGABALIN  300MG  CAPSULES] 60 capsule     Sig: TAKE 1 CAPSULE(300 MG) BY MOUTH TWICE DAILY     Date of patient request: 06/23/2023 Last office visit: 03/26/2023 Upcoming visit: 09/23/2023 Date of last refill: 05/20/2023 Last refill amount: 60

## 2023-06-23 NOTE — Telephone Encounter (Signed)
 Office visit in January.  Controlled substance database reviewed.  Pregabalin  No. 60 last filled on 05/22/2023, reordered.

## 2023-07-24 ENCOUNTER — Other Ambulatory Visit: Payer: Self-pay | Admitting: Family Medicine

## 2023-07-24 DIAGNOSIS — M792 Neuralgia and neuritis, unspecified: Secondary | ICD-10-CM

## 2023-07-24 DIAGNOSIS — E1142 Type 2 diabetes mellitus with diabetic polyneuropathy: Secondary | ICD-10-CM

## 2023-07-24 NOTE — Telephone Encounter (Signed)
 Requested Prescriptions   Pending Prescriptions Disp Refills   pregabalin  (LYRICA ) 300 MG capsule [Pharmacy Med Name: PREGABALIN  300MG  CAPSULES] 60 capsule     Sig: TAKE 1 CAPSULE(300 MG) BY MOUTH TWICE DAILY     Date of patient request: 07/24/2023 Last office visit: 03/26/2023 Upcoming visit: 09/23/2023 Date of last refill: 06/23/2023 Last refill amount: 60

## 2023-07-24 NOTE — Telephone Encounter (Signed)
 Pregabalin  discussed at his January visit, 300 mg twice daily.  Controlled substance database reviewed.  #60 last filled on 06/26/2023.  Refilled

## 2023-07-28 ENCOUNTER — Ambulatory Visit

## 2023-08-14 ENCOUNTER — Other Ambulatory Visit: Payer: Self-pay | Admitting: Internal Medicine

## 2023-08-21 ENCOUNTER — Other Ambulatory Visit: Payer: Self-pay | Admitting: Family Medicine

## 2023-08-21 DIAGNOSIS — M792 Neuralgia and neuritis, unspecified: Secondary | ICD-10-CM

## 2023-08-21 DIAGNOSIS — E1142 Type 2 diabetes mellitus with diabetic polyneuropathy: Secondary | ICD-10-CM

## 2023-08-21 NOTE — Telephone Encounter (Signed)
 Medications were discussed at his January 30 visit.  Follow-up scheduled in July.  Controlled substance database reviewed.  Lyrica  No. 60 last filled on 07/27/2023.  Previously 06/26/2023.  Will order for refill when due.

## 2023-08-21 NOTE — Telephone Encounter (Signed)
 Requested Prescriptions   Pending Prescriptions Disp Refills   pregabalin  (LYRICA ) 300 MG capsule [Pharmacy Med Name: PREGABALIN  300MG  CAPSULES] 60 capsule     Sig: TAKE 1 CAPSULE(300 MG) BY MOUTH TWICE DAILY     Date of patient request: 08/21/2023 Last office visit: 03/26/2023 Upcoming visit: 09/23/2023 Date of last refill: 07/24/2023 Last refill amount: 60

## 2023-08-25 ENCOUNTER — Other Ambulatory Visit: Payer: Self-pay | Admitting: Internal Medicine

## 2023-08-25 DIAGNOSIS — Z794 Long term (current) use of insulin: Secondary | ICD-10-CM

## 2023-09-15 ENCOUNTER — Ambulatory Visit: Admitting: Internal Medicine

## 2023-09-15 ENCOUNTER — Encounter: Payer: Self-pay | Admitting: Internal Medicine

## 2023-09-15 VITALS — BP 130/60 | HR 88 | Ht 70.0 in | Wt 250.6 lb

## 2023-09-15 DIAGNOSIS — E782 Mixed hyperlipidemia: Secondary | ICD-10-CM

## 2023-09-15 DIAGNOSIS — N1831 Chronic kidney disease, stage 3a: Secondary | ICD-10-CM

## 2023-09-15 DIAGNOSIS — Z794 Long term (current) use of insulin: Secondary | ICD-10-CM

## 2023-09-15 DIAGNOSIS — G720 Drug-induced myopathy: Secondary | ICD-10-CM | POA: Diagnosis not present

## 2023-09-15 DIAGNOSIS — E1122 Type 2 diabetes mellitus with diabetic chronic kidney disease: Secondary | ICD-10-CM | POA: Diagnosis not present

## 2023-09-15 LAB — POCT GLYCOSYLATED HEMOGLOBIN (HGB A1C): Hemoglobin A1C: 7.3 % — AB (ref 4.0–5.6)

## 2023-09-15 MED ORDER — INSULIN PEN NEEDLE 32G X 5 MM MISC: 3 refills | Status: AC

## 2023-09-15 NOTE — Patient Instructions (Addendum)
 Please continue: - Farxiga  10 mg before b'fast - Trulicity  4.5 mg weekly - Lantus  80 units daily >> move this to evening - Novolog  15 minutes before meals: 15 units in a.m. and 25 units before dinner  Try to stop eating at night, but if you have a snack, you may need to cover it with Novolog  5-8 units - including the snack at 2-3 am.  Please return in 4 months.

## 2023-09-15 NOTE — Progress Notes (Signed)
 Patient ID: Daniel [name], male   DOB: Dec 15, 1955, 68 y.o.   MRN: [id number]  HPI: Daniel [name] is a 68 y.o.-year-old male, returning for follow-up for DM2, dx in 2015, insulin -dependent since 2016, uncontrolled, with complications (PAD, foot ulcer, status post right transmetatarsal amputation, stage IIIa CKD, PN). Pt. previously saw Dr. Kassie, but last visit with me 9 months ago.  Interim history: He has increased urination, no blurry vision, chest pain.    Reviewed HbA1c: Lab Results  Component Value Date   HGBA1C 5.8 12/12/2022   HGBA1C 6.6 (A) 05/08/2022   HGBA1C 6.4 (A) 01/21/2022   HGBA1C 6.6 (A) 09/19/2021   HGBA1C 7.0 (A) 04/11/2021   HGBA1C 7.2 (A) 01/09/2021   HGBA1C 7.5 (A) 10/08/2020   HGBA1C 6.3 (A) 05/07/2020   HGBA1C 6.2 (A) 08/24/2019   HGBA1C 7.2 (A) 05/11/2019   Pt is on a regimen of: - Farxiga  10 mg before b'fast - Trulicity  4.5 mg weekly  - Lantus  100 >> 80 >> 100 >> 80 units  in a.m.  - Novolog  (15 minutes before meals) 20 units in a.m. and 30 units before dinner >> Humalog  15-20 units in a.m. and 25 units before dinner  He checks his blood sugars with his CGM:  Previously:  Previously:  Lowest sugar was 55 >> 70 >> 50s; he has hypoglycemia awareness <60.  Highest sugar was 400s >> ... 200s.  He drinks 4-5 gallons of 2% milk a week >> trying to cut down.  He was previously drinking whole milk. He sees nutrition. He eats at night, eats oatmeal + brown sugar.  - + CKD -he sees nephrology (Dr. Prescilla), last BUN/creatinine:  Lab Results  Component Value Date   BUN 27 (H) 03/26/2023   BUN 23 04/14/2022   CREATININE 1.63 (H) 03/26/2023   CREATININE 1.53 (H) 04/14/2022   03/26/2023: Microalbumin 1.2 Lab Results  Component Value Date   MICRALBCREAT 69 (H) 08/25/2019  He is not on an ACE inhibitor/ARB.  -+ HL; last set of lipids: Lab Results  Component Value Date   CHOL 115 03/26/2023   HDL 46.10 03/26/2023   LDLCALC 20 03/26/2023    TRIG 249.0 (H) 03/26/2023   CHOLHDL 3 03/26/2023  He is on Zetia  10 mg daily, Repatha .  He is intolerant to statins due to myalgias.   He does have a history of NASH.  - last eye exam was in 05/29/2022. No DR. Dr. Roz.  - + numbness and tingling in his feet.  Last foot exam was 12/12/2022.  He is on Lyrica  300 mg twice a day. Sees Dr. Roddie - podiatrist in [place].  He does not have a hypothyroidism - this is Rx'ed by his psychiatrist - Dr. Vincente. Pt is on levothyroxine  50 mcg daily, taken: - in am - fasting - 15-20 min from b'fast  Reviewed his latest TSH levels: Lab Results  Component Value Date   TSH 0.47 05/07/2020   TSH 0.108 (L) 09/14/2019   TSH 0.736 09/17/2016   TSH 0.19 (L) 07/11/2015   TSH 1.067 06/21/2013   TSH 2.762 01/01/2007   He also has a history of HTN, liver disease, OSA, HIV, ADHD, on chronic opioids.  ROS: + see HPI  Past Medical History:  Diagnosis Date   ADHD (attention deficit hyperactivity disorder)    Anxiety    Chronic kidney disease    Clotting disorder ([name])    Depression    Diabetes mellitus without complication ([name])    Diabetes  mellitus, type II ([name])    Dizziness 03/17/2015   Essential hypertension 06/25/2018   GERD (gastroesophageal reflux disease)    HIV disease ([name])    HIV infection ([name])    Liver disease    OSA (obstructive sleep apnea) 07/25/2015   Uses CPAP regularly   Peripheral vascular disease ([name])    Ulcer    Past Surgical History:  Procedure Laterality Date   AMPUTATION Right 10/02/2017   Procedure: RIGHT TRANSMETATARSAL AMPUTATION;  Surgeon: Jerri Kay HERO, MD;  Location: [facility] OR;  Service: Orthopedics;  Laterality: Right;   SMALL INTESTINE SURGERY     STOMACH SURGERY     TOE AMPUTATION Right 08/2016   right great toe   Social History   Socioeconomic History   Marital status: Single    Spouse name: Not on file   Number of children: Not on file   Years of education: Not on file   Highest education level: 12th  grade  Occupational History    Comment: DISABILITY  Tobacco Use   Smoking status: Former    Current packs/day: 0.00    Average packs/day: 0.1 packs/day for 10.0 years (1.0 ttl pk-yrs)    Types: Cigars, Cigarettes    Start date: 08/08/2004    Quit date: 08/09/2014    Years since quitting: 9.1   Smokeless tobacco: Never  Vaping Use   Vaping status: Never Used  Substance and Sexual Activity   Alcohol use: No    Alcohol/week: 0.0 standard drinks of alcohol   Drug use: No   Sexual activity: Not Currently    Partners: Male    Comment: pt. declined condoms  Other Topics Concern   Not on file  Social History Narrative   Epworth Sleepiness Scale = 7 (as of 03/16/2015)   Social Drivers of Health   Financial Resource Strain: Low Risk  (07/28/2023)   Overall Financial Resource Strain (CARDIA)    Difficulty of Paying Living Expenses: Not very hard  Food Insecurity: No Food Insecurity (07/28/2023)   Hunger Vital Sign    Worried About Running Out of Food in the Last Year: Never true    Ran Out of Food in the Last Year: Never true  Transportation Needs: No Transportation Needs (07/28/2023)   PRAPARE - Administrator, Civil Service (Medical): No    Lack of Transportation (Non-Medical): No  Physical Activity: Unknown (07/28/2023)   Exercise Vital Sign    Days of Exercise per Week: 0 days    Minutes of Exercise per Session: Not on file  Stress: Stress Concern Present (07/28/2023)   [employer] of Occupational Health - Occupational Stress Questionnaire    Feeling of Stress : To some extent  Social Connections: Socially Isolated (07/28/2023)   Social Connection and Isolation Panel    Frequency of Communication with Friends and Family: Once a week    Frequency of Social Gatherings with Friends and Family: Twice a week    Attends Religious Services: Never    Database administrator or Organizations: No    Attends Engineer, structural: Not on file    Marital Status: Never  married  Intimate Partner Violence: Not At Risk (10/01/2017)   Humiliation, Afraid, Rape, and Kick questionnaire    Fear of Current or Ex-Partner: No    Emotionally Abused: No    Physically Abused: No    Sexually Abused: No   Current Outpatient Medications on File Prior to Visit  Medication Sig Dispense Refill  abacavir -dolutegravir -lamiVUDine  (TRIUMEQ ) 600-50-300 MG tablet Take 1 tablet by mouth daily. 30 tablet 11   acetaminophen  (TYLENOL ) 500 MG tablet Take 1,500 mg by mouth in the morning and at bedtime.     ALPRAZolam  (XANAX ) 1 MG tablet Take 1 mg by mouth at bedtime. *May take one tablet up to 4 times daily as needed for anxiety     amphetamine -dextroamphetamine  (ADDERALL) 30 MG tablet Take 30 mg by mouth 2 (two) times daily.     Continuous Glucose Sensor (FREESTYLE LIBRE 2 SENSOR) MISC CHECK GLUCOSE CONTINUOUSLY CHANGE SENSOR EVERY 14 DAYS. 6 each 3   Dextromethorphan-buPROPion ER (AUVELITY) 45-105 MG TBCR Take 1 tablet by mouth daily at 12 noon.     diclofenac  Sodium (VOLTAREN ) 1 % GEL APPLY 2 GM TO EACH KNEE EVERY MORNING AND EVERY NIGHT AT BEDTIME, AND APPLY 1 GM TO EACH KNEE EVERY AFTERNOON 300 g 2   divalproex  (DEPAKOTE  ER) 500 MG 24 hr tablet TAKE 1 TABLET(500 MG) BY MOUTH AT BEDTIME 90 tablet 1   Dulaglutide  (TRULICITY ) 4.5 MG/0.5ML SOAJ ADMINISTER 4.5 MG UNDER THE SKIN 1 TIME A WEEK 6 mL 3   ezetimibe  (ZETIA ) 10 MG tablet TAKE 1 TABLET BY MOUTH DAILY 90 tablet 3   FARXIGA  10 MG TABS tablet TAKE 1 TABLET(10 MG) BY MOUTH DAILY BEFORE BREAKFAST 90 tablet 3   finasteride (PROSCAR) 5 MG tablet Take 5 mg by mouth daily.     furosemide  (LASIX ) 40 MG tablet TAKE 60MG  FOR 3 DAYS THEN BACK TO 40MG  DAILY; TAKE 1 TABLET(40 MG) BY MOUTH DAILY 93 tablet 3   insulin  glargine (LANTUS  SOLOSTAR) 100 UNIT/ML Solostar Pen Inject 80-100 Units into the skin every morning. 105 mL 3   insulin  lispro (HUMALOG  KWIKPEN) 100 UNIT/ML KwikPen INJECT 15 UNITS WITH BREAKFAST AND 25 UNITS WITH SUPPER 45 mL 2    Insulin  Pen Needle (B-D ULTRAFINE III SHORT PEN) 31G X 8 MM MISC USE AS DIRECTED THREE TIMES DAILY 100 each 5   Lemborexant (DAYVIGO) 10 MG TABS Take 10 mg by mouth at bedtime.     levothyroxine  (SYNTHROID , LEVOTHROID) 50 MCG tablet Take 1 tablet (50 mcg total) by mouth at bedtime. 30 tablet 11   pantoprazole  (PROTONIX ) 40 MG tablet Take 1 tablet (40 mg total) by mouth 2 (two) times daily. 180 tablet 1   pregabalin  (LYRICA ) 300 MG capsule TAKE 1 CAPSULE(300 MG) BY MOUTH TWICE DAILY 60 capsule 0   REPATHA  SURECLICK 140 MG/ML SOAJ ADMINISTER 1 ML UNDER THE SKIN EVERY 14 DAYS 2 mL 11   rivaroxaban  (XARELTO ) 20 MG TABS tablet TAKE 1 TABLET(20 MG) BY MOUTH DAILY WITH SUPPER 90 tablet 2   silodosin (RAPAFLO) 8 MG CAPS capsule Take 8 mg by mouth daily. (Patient not taking: Reported on 03/26/2023)     TRINTELLIX  5 MG TABS tablet Take 5 mg by mouth daily.     No current facility-administered medications on file prior to visit.   Allergies  Allergen Reactions   Aspirin Swelling   Efavirenz  Swelling and Rash    Other reaction(s): anaphylaxis   Ibuprofen Swelling   Nsaids Other (See Comments)    unknwn   Pravastatin     myalgias   Lipitor [Atorvastatin  Calcium ] Other (See Comments)    Leg pain   Family History  Problem Relation Age of Onset   Depression Brother    Throat cancer Brother        half brother, never smoker   COPD Mother    Diabetes  Neg Hx    PE: There were no vitals taken for this visit. Wt Readings from Last 3 Encounters:  04/29/23 254 lb 6.4 oz (115.4 kg)  04/21/23 253 lb 9.6 oz (115 kg)  03/26/23 249 lb 3.2 oz (113 kg)   Constitutional: overweight, in NAD Eyes:  EOMI, no exophthalmos ENT: no neck masses, no cervical lymphadenopathy Cardiovascular: RRR, No MRG Respiratory: CTA B Musculoskeletal: + deformities - R TMA Skin:no rashes, + stasis dermatitis, no open wounds Neurological: + Resting tremor Diabetic Foot Exam - Simple   Simple Foot Form Diabetic Foot exam  was performed with the following findings: Yes 09/15/2023  2:53 PM  Visual Inspection See comments: Yes Sensation Testing See comments: Yes Pulse Check Posterior Tibialis and Dorsalis pulse intact bilaterally: Yes Comments No sensation to monofilament B feet. Transmetatarsal amputation R foot.    ASSESSMENT: 1. DM2, insulin -dependent, uncontrolled, with complications - PAD - foot ulcer, status post right transmetatarsal amputation - stage IIIa CKD - PN  2.  Hyperlipidemia  3.  Acquired hypothyroidism  PLAN:  1. Patient with longstanding, fairly well-controlled type 2 diabetes, on oral antidiabetic regimen with SGLT2 inhibitor, also weekly GLP-1 receptor agonist and basal/bolus insulin  regimen, with blood sugars mostly fluctuating within the target range at last visit.  He had higher blood sugars in the second half of the night due to staying up late and snacking but he was dropping his blood sugars in the afternoon so I advised him to reduce the dose of Lantus .  I also advised him to decrease NovoLog  before breakfast and dinner.  We discussed about trying to stop the snack at night but if he could not, to take 5 to 8 units of NovoLog  before this.  We continued the same dose of Farxiga  and Trulicity . CGM interpretation: -At today's visit, we reviewed his CGM downloads: It appears that 58% of values are in target range (goal >70%), while 41% are higher than 180 (goal <25%), and 1% are lower than 70 (goal <4%).  The calculated average blood sugar is 172.  The projected HbA1c for the next 3 months (GMI) is 7.4%. -Reviewing the CGM trends, sugars are higher , increasing significantly overnight when he stays up late, the improving during the day.  Upon questioning, he is taking insulin  before his for snack around 11 PM, but not with later snacks and we discussed about covering these with insulin , also, to prevent the hyperglycemic spike around 4-7 AM.  Also, I think it would be beneficial for him  to take Lantus  in the evening, for better sugars overnight.  Otherwise, we can continue the rest of the regimen. -I advised him to: Patient Instructions  Please continue: - Farxiga  10 mg before b'fast - Trulicity  4.5 mg weekly - Lantus  80 units daily >> move this to evening - Novolog  15 minutes before meals: 15 units in a.m. and 25 units before dinner  Try to stop eating at night, but if you have a snack, you may need to cover it with Novolog  5-8 units - including the snack at 2-3 am.  Please return in 4 months.  - we checked his HbA1c: 7.3% (higher) - advised to check sugars at different times of the day - 4x a day, rotating check times - advised for yearly eye exams >> he is UTD -He had an elevated ACR at last check.  Will repeat this today. - return to clinic in 4 months  2. HL and 4.  Drug (statin)-induced myopathy -  Latest lipid panel from 02/2023 showed high triglycerides, but otherwise fractions at goal: Lab Results  Component Value Date   CHOL 115 03/26/2023   HDL 46.10 03/26/2023   LDLCALC 20 03/26/2023   TRIG 249.0 (H) 03/26/2023   CHOLHDL 3 03/26/2023  - Continues on Zetia  10 mg daily and Repatha , without side effects.  He could not tolerate statins due to myalgias.  3.  Thyroid  hormone use -He was started on thyroid  hormones by his psychiatrist, without a diagnosis of hypothyroidism, apparently for symptom control -Latest TSH available for review was normal: Lab Results  Component Value Date   TSH 0.47 05/07/2020  -He continues on levothyroxine  50 mcg daily - Management per psychiatry  Lela Fendt, MD PhD [facility]

## 2023-09-22 ENCOUNTER — Encounter: Payer: Self-pay | Admitting: Pharmacist

## 2023-09-22 ENCOUNTER — Other Ambulatory Visit: Payer: Self-pay | Admitting: Family Medicine

## 2023-09-22 DIAGNOSIS — E1142 Type 2 diabetes mellitus with diabetic polyneuropathy: Secondary | ICD-10-CM

## 2023-09-22 DIAGNOSIS — M792 Neuralgia and neuritis, unspecified: Secondary | ICD-10-CM

## 2023-09-22 DIAGNOSIS — I824Y9 Acute embolism and thrombosis of unspecified deep veins of unspecified proximal lower extremity: Secondary | ICD-10-CM

## 2023-09-22 NOTE — Telephone Encounter (Signed)
 Controlled substance database reviewed, pregabalin  No. 60 last filled on 08/31/2023.  Previously 07/27/23.  No concerns.  Anticoagulation discussed at his January visit, Xarelto  20 mg daily, refills of both ordered.

## 2023-09-22 NOTE — Telephone Encounter (Signed)
 Requested Prescriptions   Pending Prescriptions Disp Refills   XARELTO  20 MG TABS tablet [Pharmacy Med Name: XARELTO  20MG  TABLETS] 90 tablet 2    Sig: TAKE 1 TABLET(20 MG) BY MOUTH DAILY WITH SUPPER   pregabalin  (LYRICA ) 300 MG capsule [Pharmacy Med Name: PREGABALIN  300MG  CAPSULES] 60 capsule     Sig: TAKE 1 CAPSULE(300 MG) BY MOUTH TWICE DAILY     Date of patient request: 09/22/2023  Last office visit: 03/26/2023 Upcoming visit: 09/23/2023 Date of last refill: 08/21/2023  Last refill amount: 90, 30

## 2023-09-22 NOTE — Progress Notes (Signed)
 Pharmacy Quality Measure Review  Statin Use in Persons with Diabetes (SUPD)   Prior trials of: PRAVASTATIN AND ATORVASTATIN  - both caused myalgias.   Patient is taking Repatha  and ezetimibe .  He is past due to refill Repatha  - LR was 08/12/2023 for 28 day supply  Lab Results  Component Value Date   CHOL 115 03/26/2023   HDL 46.10 03/26/2023   LDLCALC 20 03/26/2023   TRIG 249.0 (H) 03/26/2023   CHOLHDL 3 03/26/2023    Statin exclusion code was used in 2024 but needs to be documented for 2025.   Sent message to PCP to code if appropriate for drug induced myopathy / G72.0  Also call patient and left a message to remind him to refill Repatha .   Madelin Ray, PharmD Clinical Pharmacist [facility]  Population Health [phone]

## 2023-09-23 ENCOUNTER — Ambulatory Visit: Payer: PPO | Admitting: Family Medicine

## 2023-11-02 ENCOUNTER — Other Ambulatory Visit: Payer: Self-pay | Admitting: Family Medicine

## 2023-11-02 DIAGNOSIS — M792 Neuralgia and neuritis, unspecified: Secondary | ICD-10-CM

## 2023-11-02 DIAGNOSIS — E1142 Type 2 diabetes mellitus with diabetic polyneuropathy: Secondary | ICD-10-CM

## 2023-11-02 NOTE — Telephone Encounter (Signed)
 Requested Prescriptions   Pending Prescriptions Disp Refills   pregabalin  (LYRICA ) 300 MG capsule [Pharmacy Med Name: PREGABALIN  300MG  CAPSULES] 60 capsule     Sig: TAKE 1 CAPSULE(300 MG) BY MOUTH TWICE DAILY     Date of patient request: 11/02/23 Last office visit: 03/26/2023 Upcoming visit: 11/05/2023 Date of last refill: 09/22/23 Last refill amount: 60 caplsules

## 2023-11-03 NOTE — Telephone Encounter (Signed)
 Peripheral neuropathy treatment discussed in January.  Continued on Lyrica .  Controlled substance database reviewed.  #60 last filled on 09/25/2023, previously 08/31/2023. upcoming appt noted.  Consistent refills.  Refill ordered.

## 2023-11-04 ENCOUNTER — Ambulatory Visit: Admitting: Family Medicine

## 2023-11-04 ENCOUNTER — Ambulatory Visit: Admitting: Internal Medicine

## 2023-11-04 ENCOUNTER — Encounter: Payer: Self-pay | Admitting: Pharmacist

## 2023-11-04 NOTE — Progress Notes (Signed)
 Pharmacy Quality Measure Review  Statin Use in Persons with Diabetes (SUPD)  Prior trials of: PRAVASTATIN AND ATORVASTATIN  - both caused myalgias.   Patient is taking Repatha  and ezetimibe .    Lab Results  Component Value Date   CHOL 115 03/26/2023   HDL 46.10 03/26/2023   LDLCALC 20 03/26/2023   TRIG 249.0 (H) 03/26/2023   CHOLHDL 3 03/26/2023    Statin exclusion code was used by Dr Trixie 09/15/2023 - drug induced myopathy  No further action needed at this time.   Madelin Ray, PharmD Clinical Pharmacist [facility]  Population Health [phone]

## 2023-11-05 ENCOUNTER — Ambulatory Visit: Admitting: Family Medicine

## 2023-11-26 ENCOUNTER — Other Ambulatory Visit: Payer: Self-pay

## 2023-11-26 ENCOUNTER — Encounter: Payer: Self-pay | Admitting: Family

## 2023-11-26 ENCOUNTER — Ambulatory Visit (INDEPENDENT_AMBULATORY_CARE_PROVIDER_SITE_OTHER): Admitting: Family

## 2023-11-26 VITALS — BP 130/76 | HR 91 | Temp 98.6°F | Resp 16 | Wt 255.2 lb

## 2023-11-26 DIAGNOSIS — B20 Human immunodeficiency virus [HIV] disease: Secondary | ICD-10-CM | POA: Diagnosis not present

## 2023-11-26 DIAGNOSIS — Z0000 Encounter for general adult medical examination without abnormal findings: Secondary | ICD-10-CM | POA: Insufficient documentation

## 2023-11-26 DIAGNOSIS — N1832 Chronic kidney disease, stage 3b: Secondary | ICD-10-CM | POA: Diagnosis not present

## 2023-11-26 DIAGNOSIS — Z21 Asymptomatic human immunodeficiency virus [HIV] infection status: Secondary | ICD-10-CM

## 2023-11-26 DIAGNOSIS — Z79899 Other long term (current) drug therapy: Secondary | ICD-10-CM

## 2023-11-26 DIAGNOSIS — Z23 Encounter for immunization: Secondary | ICD-10-CM

## 2023-11-26 MED ORDER — TRIUMEQ 600-50-300 MG PO TABS: 1.0000 | ORAL_TABLET | Freq: Every day | ORAL | 11 refills | Status: AC

## 2023-11-26 NOTE — Assessment & Plan Note (Signed)
 Daniel [name] continues to have well-controlled virus with good adherence and tolerance to Triumeq .  Reviewed previous lab work and discussed plan of care and U equals U.  No problems obtaining medication from the pharmacy and covered by health team advantage.  Social determinants of health reviewed with no interventions indicated.  Check blood work.  Continue current dose of Triumeq .  Plan for follow-up in 6 months or sooner if needed with lab work on the same day.

## 2023-11-26 NOTE — Assessment & Plan Note (Signed)
 Most recent lab work consistent with chronic kidney disease stage IIIb.  Working on establishing with a new nephrologist.  No indications to change medications at this time.  Check lab work and continue to monitor renal function and adjust medication accordingly.

## 2023-11-26 NOTE — Assessment & Plan Note (Signed)
 Not currently sexually active.  Vaccinations reviewed and will update influenza today. Due for routine dental care which he will schedule in [place].  Colon cancer screening up to date.

## 2023-11-26 NOTE — Progress Notes (Signed)
 Brief Narrative   Patient ID: Daniel [name], male    DOB: Dec 27, 1955, 67 y.o.   MRN: [id number]  Daniel [name] is a 68 year old Caucasian male diagnosed with HIV around 1999 with risk factor of MSM.  Initial viral load was 133,180 with CD4 count 290.  Entered care at [facility] stage II.  No genotype available.  No history of opportunistic infection.  ART experienced with Atripla, Genvoya, and Triumeq .  Subjective:   Chief Complaint  Patient presents with   Follow-up    B20     HPI:  Daniel [name] is a 68 y.o. male with HIV disease last seen on 04/29/2023 by Dr. Efrain with well-controlled virus and good adherence and tolerance to Triumeq .  Viral load was undetectable with CD4 count 791.  Liver function and electrolytes within normal ranges.  Renal function with creatinine 1.63 and estimated GFR of 42 consistent with CKD stage IIIb.  Here today for routine follow-up.  [name] has been doing okay since his last office visit and continues to take Triumeq  as prescribed with no adverse side effects or problems obtaining medication from the pharmacy.  No new concerns/complaints.  Housing, transportation, and access to food are stable.  Healthcare maintenance reviewed.  Due for routine dental care   Denies fevers, chills, night sweats, headaches, changes in vision, neck pain/stiffness, nausea, diarrhea, vomiting, lesions or rashes.  Lab Results  Component Value Date   CD4TCELL 34 04/29/2023   CD4TABS 791 04/29/2023   Lab Results  Component Value Date   HIV1RNAQUANT 50 (H) 04/29/2023     Allergies  Allergen Reactions   Aspirin Swelling   Efavirenz  Swelling and Rash    Other reaction(s): anaphylaxis   Ibuprofen Swelling   Nsaids Other (See Comments)    unknwn   Pravastatin     myalgias   Lipitor [Atorvastatin  Calcium ] Other (See Comments)    Leg pain      Outpatient Medications Prior to Visit  Medication Sig Dispense Refill   acetaminophen  (TYLENOL ) 500 MG tablet Take 1,500 mg  by mouth in the morning and at bedtime.     ALPRAZolam  (XANAX ) 1 MG tablet Take 1 mg by mouth at bedtime. *May take one tablet up to 4 times daily as needed for anxiety     amphetamine -dextroamphetamine  (ADDERALL) 30 MG tablet Take 30 mg by mouth 2 (two) times daily.     Continuous Glucose Sensor (FREESTYLE LIBRE 2 SENSOR) MISC CHECK GLUCOSE CONTINUOUSLY CHANGE SENSOR EVERY 14 DAYS. 6 each 3   Dextromethorphan-buPROPion ER (AUVELITY) 45-105 MG TBCR Take 1 tablet by mouth daily at 12 noon.     diclofenac  Sodium (VOLTAREN ) 1 % GEL APPLY 2 GM TO EACH KNEE EVERY MORNING AND EVERY NIGHT AT BEDTIME, AND APPLY 1 GM TO EACH KNEE EVERY AFTERNOON 300 g 2   divalproex  (DEPAKOTE  ER) 500 MG 24 hr tablet TAKE 1 TABLET(500 MG) BY MOUTH AT BEDTIME 90 tablet 1   Dulaglutide  (TRULICITY ) 4.5 MG/0.5ML SOAJ ADMINISTER 4.5 MG UNDER THE SKIN 1 TIME A WEEK 6 mL 3   ezetimibe  (ZETIA ) 10 MG tablet TAKE 1 TABLET BY MOUTH DAILY 90 tablet 3   FARXIGA  10 MG TABS tablet TAKE 1 TABLET(10 MG) BY MOUTH DAILY BEFORE BREAKFAST 90 tablet 3   finasteride (PROSCAR) 5 MG tablet Take 5 mg by mouth daily.     furosemide  (LASIX ) 40 MG tablet TAKE 60MG  FOR 3 DAYS THEN BACK TO 40MG  DAILY; TAKE 1 TABLET(40 MG) BY MOUTH DAILY  93 tablet 3   insulin  glargine (LANTUS  SOLOSTAR) 100 UNIT/ML Solostar Pen Inject 80-100 Units into the skin every morning. 105 mL 3   insulin  lispro (HUMALOG  KWIKPEN) 100 UNIT/ML KwikPen INJECT 15 UNITS WITH BREAKFAST AND 25 UNITS WITH SUPPER 45 mL 2   Insulin  Pen Needle 32G X 5 MM MISC Use 3x a day 300 each 3   Lemborexant (DAYVIGO) 10 MG TABS Take 10 mg by mouth at bedtime.     levothyroxine  (SYNTHROID , LEVOTHROID) 50 MCG tablet Take 1 tablet (50 mcg total) by mouth at bedtime. 30 tablet 11   pantoprazole  (PROTONIX ) 40 MG tablet Take 1 tablet (40 mg total) by mouth 2 (two) times daily. 180 tablet 1   pregabalin  (LYRICA ) 300 MG capsule TAKE 1 CAPSULE(300 MG) BY MOUTH TWICE DAILY 60 capsule 0   REPATHA  SURECLICK 140 MG/ML  SOAJ ADMINISTER 1 ML UNDER THE SKIN EVERY 14 DAYS 2 mL 11   silodosin (RAPAFLO) 8 MG CAPS capsule Take 8 mg by mouth daily.     TRINTELLIX  5 MG TABS tablet Take 5 mg by mouth daily.     XARELTO  20 MG TABS tablet TAKE 1 TABLET(20 MG) BY MOUTH DAILY WITH SUPPER 90 tablet 2   abacavir -dolutegravir -lamiVUDine  (TRIUMEQ ) 600-50-300 MG tablet Take 1 tablet by mouth daily. 30 tablet 11   No facility-administered medications prior to visit.     Past Medical History:  Diagnosis Date   ADHD (attention deficit hyperactivity disorder)    Anxiety    Chronic kidney disease    Clotting disorder    Depression    Diabetes mellitus without complication ([name])    Diabetes mellitus, type II ([name])    Dizziness 03/17/2015   Essential hypertension 06/25/2018   GERD (gastroesophageal reflux disease)    HIV disease ([name])    HIV infection ([name])    Liver disease    OSA (obstructive sleep apnea) 07/25/2015   Uses CPAP regularly   Peripheral vascular disease    Ulcer      Past Surgical History:  Procedure Laterality Date   AMPUTATION Right 10/02/2017   Procedure: RIGHT TRANSMETATARSAL AMPUTATION;  Surgeon: Jerri Kay HERO, MD;  Location: [facility] OR;  Service: Orthopedics;  Laterality: Right;   SMALL INTESTINE SURGERY     STOMACH SURGERY     TOE AMPUTATION Right 08/2016   right great toe        Review of Systems  Constitutional:  Negative for chills, diaphoresis, fatigue and fever.  Respiratory:  Negative for cough, chest tightness, shortness of breath and wheezing.   Cardiovascular:  Negative for chest pain.  Gastrointestinal:  Negative for abdominal pain, diarrhea, nausea and vomiting.     Objective:   BP 130/76   Pulse 91   Temp 98.6 F (37 C) (Oral)   Resp 16   Wt 255 lb 3.2 oz (115.8 kg)   SpO2 93%   BMI 36.62 kg/m  Nursing note and vital signs reviewed.  Physical Exam Constitutional:      General: He is not in acute distress.    Appearance: He is well-developed.  Eyes:      Conjunctiva/sclera: Conjunctivae normal.  Cardiovascular:     Rate and Rhythm: Normal rate and regular rhythm.     Heart sounds: Normal heart sounds. No murmur heard.    No friction rub. No gallop.  Pulmonary:     Effort: Pulmonary effort is normal. No respiratory distress.     Breath sounds: Normal breath sounds. No wheezing or rales.  Chest:     Chest wall: No tenderness.  Abdominal:     General: Bowel sounds are normal.     Palpations: Abdomen is soft.     Tenderness: There is no abdominal tenderness.  Musculoskeletal:     Cervical back: Neck supple.  Lymphadenopathy:     Cervical: No cervical adenopathy.  Skin:    General: Skin is warm and dry.     Findings: No rash.  Neurological:     Mental Status: He is alert and oriented to person, place, and time.  Psychiatric:        Mood and Affect: Affect is flat.        Speech: Speech normal.          11/26/2023    2:43 PM 04/29/2023    3:46 PM 03/26/2023    2:57 PM 09/22/2022    3:52 PM 08/18/2022    9:28 AM  Depression screen PHQ 2/9  Decreased Interest 1 0 3 1 0  Down, Depressed, Hopeless 1 1 2 1 2   PHQ - 2 Score 2 1 5 2 2   Altered sleeping 1  1 1 2   Tired, decreased energy 1  3 1 3   Change in appetite 1  3 1 2   Feeling bad or failure about yourself  1  2 0 1  Trouble concentrating 1  1 1  0  Moving slowly or fidgety/restless 1  0 0 0  Suicidal thoughts   0 0   PHQ-9 Score 8  15 6 10   Difficult doing work/chores Somewhat difficult  Somewhat difficult Somewhat difficult Somewhat difficult        03/26/2023    3:03 PM 09/22/2022    3:52 PM 06/23/2022    3:45 PM 11/18/2019    3:56 PM  GAD 7 : Generalized Anxiety Score  Nervous, Anxious, on Edge 0 1 0 0  Control/stop worrying 0 0 0 0  Worry too much - different things 0 0 1 0  Trouble relaxing 0 0 0 0  Restless 0 0 0 0  Easily annoyed or irritable 0 0 2 0  Afraid - awful might happen 0 0 0 0  Total GAD 7 Score 0 1 3 0  Anxiety Difficulty  Somewhat difficult Somewhat  difficult Not difficult at all     The ASCVD Risk score (Arnett DK, et al., 2019) failed to calculate for the following reasons:   The valid total cholesterol range is 130 to 320 mg/dL      Assessment & Plan:    Patient Active Problem List   Diagnosis Date Noted   Healthcare maintenance 11/26/2023   Type 2 diabetes mellitus with diabetic peripheral angiopathy without gangrene ([name]) 06/23/2022   Routine screening for STI (sexually transmitted infection) 04/29/2022   Mixed hyperlipidemia 09/19/2021   Type II diabetes mellitus with peripheral circulatory disorder ([name]) 09/19/2021   Insomnia due to mental condition 05/23/2021   Atherosclerosis of native artery of extremity 04/25/2021   Chronic nonalcoholic liver disease 04/25/2021   History of hepatitis B 04/25/2021   Long term (current) use of insulin  ([name]) 04/25/2021   Long term (current) use of anticoagulants 04/25/2021   Nonalcoholic steatohepatitis (NASH) 04/25/2021   Type 2 diabetes mellitus with other circulatory complications ([name]) 04/25/2021   Type 2 diabetes mellitus with peripheral angiopathy ([name]) 04/25/2021   Abnormal liver function tests 04/25/2021   Drug-induced myopathy 02/12/2021   Unilateral primary osteoarthritis, right knee 12/15/2019   Generalized weakness 09/14/2019   Severe  major depression ([name]) 11/17/2018   Class 2 obesity due to excess calories without serious comorbidity with body mass index (BMI) of 36.0 to 36.9 in adult 11/17/2018   Anticoagulated 08/24/2018   Dyslipidemia 07/27/2018   Essential hypertension 06/25/2018   Hypogonadism in male 05/10/2018   Numbness 03/31/2018   Diabetic peripheral neuropathy ([name]) 01/25/2018   S/P transmetatarsal amputation of foot, right ([name]) 10/09/2017   Hypothyroidism    Constipation    Obesity, Class III, BMI 40-49.9 (morbid obesity) ([name]) 01/15/2017   DOE (dyspnea on exertion) 01/13/2017   Imbalance 11/18/2016   Osteomyelitis of right foot ([name]) 09/22/2016    Chronic migraine 09/17/2016   Gait abnormality 09/17/2016   Fall 12/05/2015   Toe ulcer, right ([name]) 09/19/2015   Decreased pedal pulses 09/19/2015   OSA on CPAP 09/05/2015   Major depressive disorder, recurrent episode, moderate ([name]) 09/05/2015   Recurrent falls while walking 06/24/2015   H/O migraine 06/22/2015   Sinus headache 06/21/2015   OSA (obstructive sleep apnea) 06/20/2015   History of DVT (deep vein thrombosis) 06/20/2015   History of falling 06/20/2015   Chronic, continuous use of opioids 06/20/2015   Coagulopathy 06/20/2015   Elevated CPK 06/20/2015   Pain syndrome, chronic 06/20/2015   Thrombocythemia 06/20/2015   Uncontrolled type 2 diabetes mellitus with hyperglycemia ([name]) 06/20/2015   Aspiration into airway 06/20/2015   Low back pain 06/11/2015   Unspecified abnormalities of gait and mobility 06/11/2015   Chronic kidney disease, stage III (moderate) ([name]) 08/09/2014   Renal insufficiency 08/09/2014   Insulin -requiring or dependent type II diabetes mellitus ([name]) 11/07/2013   Diabetes mellitus ([name]) 11/07/2013   Abnormal result on screening urine test 09/03/2006   Human immunodeficiency virus (HIV) infection ([name]) 06/04/2006   Herpes zoster 06/04/2006   Depression 06/04/2006   THROMBOPHLEBITIS NOS 06/04/2006   Gastroesophageal reflux disease 06/04/2006   Arthropathy of hand 06/04/2006   Attention deficit hyperactivity disorder 06/04/2006     Problem List Items Addressed This Visit       Genitourinary   Chronic kidney disease, stage III (moderate) ([name])   Most recent lab work consistent with chronic kidney disease stage IIIb.  Working on establishing with a new nephrologist.  No indications to change medications at this time.  Check lab work and continue to monitor renal function and adjust medication accordingly.        Other   Human immunodeficiency virus (HIV) infection ([name]) - Primary   Mr. Ries continues to have well-controlled virus with good  adherence and tolerance to Triumeq .  Reviewed previous lab work and discussed plan of care and U equals U.  No problems obtaining medication from the pharmacy and covered by health team advantage.  Social determinants of health reviewed with no interventions indicated.  Check blood work.  Continue current dose of Triumeq .  Plan for follow-up in 6 months or sooner if needed with lab work on the same day.      Relevant Medications   abacavir -dolutegravir -lamiVUDine  (TRIUMEQ ) 600-50-300 MG tablet   Other Relevant Orders   Comprehensive metabolic panel with GFR   HIV-1 RNA quant-no reflex-bld   T-helper cell (CD4)- ([facility] clinic only)   Healthcare maintenance   Not currently sexually active.  Vaccinations reviewed and will update influenza today. Due for routine dental care which he will schedule in [place].  Colon cancer screening up to date.       Other Visit Diagnoses       Pharmacologic therapy  Relevant Orders   Lipid panel     Encounter for immunization       Relevant Orders   Flu vaccine HIGH DOSE PF(Fluzone Trivalent) (Completed)        I am having Nancyann MICAEL Ka maintain his ALPRAZolam , levothyroxine , acetaminophen , amphetamine -dextroamphetamine , silodosin, Auvelity, DayVigo, finasteride, Repatha  SureClick, Lantus  SoloStar, Farxiga , FreeStyle Libre 2 Sensor, pantoprazole , diclofenac  Sodium, divalproex , Trintellix , furosemide , ezetimibe , Trulicity , insulin  lispro, Insulin  Pen Needle, Xarelto , pregabalin , and Triumeq .   Meds ordered this encounter  Medications   abacavir -dolutegravir -lamiVUDine  (TRIUMEQ ) 600-50-300 MG tablet    Sig: Take 1 tablet by mouth daily.    Dispense:  30 tablet    Refill:  11    Supervising Provider:   LUIZ CHANNEL [phone]    Prescription Type::   Renewal     Follow-up: Return in about 6 months (around 05/26/2024). or sooner if needed.    Cathlyn July, MSN, FNP-C Nurse Practitioner [facility] for Infectious Disease [facility] Main number: [phone]

## 2023-11-26 NOTE — Patient Instructions (Signed)
 Nice to see you. ? ?We will check your lab work today. ? ?Continue to take your medication daily as prescribed. ? ?Refills have been sent to the pharmacy. ? ?Plan for follow up in 6 months or sooner if needed with lab work on the same day. ? ?Have a great day and stay safe! ? ?

## 2023-11-27 LAB — T-HELPER CELL (CD4) - (RCID CLINIC ONLY)
CD4 % Helper T Cell: 34 % (ref 33–65)
CD4 T Cell Abs: 798 /uL (ref 400–1790)

## 2023-11-30 ENCOUNTER — Other Ambulatory Visit: Payer: Self-pay | Admitting: Family Medicine

## 2023-11-30 DIAGNOSIS — M792 Neuralgia and neuritis, unspecified: Secondary | ICD-10-CM

## 2023-11-30 DIAGNOSIS — E1142 Type 2 diabetes mellitus with diabetic polyneuropathy: Secondary | ICD-10-CM

## 2023-11-30 NOTE — Telephone Encounter (Signed)
 Requested Prescriptions   Pending Prescriptions Disp Refills   pregabalin  (LYRICA ) 300 MG capsule [Pharmacy Med Name: PREGABALIN  300MG  CAPSULES] 60 capsule     Sig: TAKE 1 CAPSULE(300 MG) BY MOUTH TWICE DAILY     Date of patient request: 11/30/23 Last office visit: 03/26/2023 Upcoming visit: 12/03/2023 Date of last refill: 11/03/23 Last refill amount: 60 capsules

## 2023-11-30 NOTE — Telephone Encounter (Signed)
 Appointment scheduled October 9.  Will review medications at that time.  Controlled substance database reviewed.  Pregabalin  300 mg #60 last filled on 11/03/2023.  Previously 09/25/2023, 08/31/2023.  Refill will be ordered, keep follow-up as planned.

## 2023-12-01 ENCOUNTER — Ambulatory Visit: Payer: Self-pay | Admitting: Family

## 2023-12-01 LAB — COMPREHENSIVE METABOLIC PANEL WITH GFR
AG Ratio: 1.4 (calc) (ref 1.0–2.5)
ALT: 34 U/L (ref 9–46)
AST: 22 U/L (ref 10–35)
Albumin: 4.2 g/dL (ref 3.6–5.1)
Alkaline phosphatase (APISO): 81 U/L (ref 35–144)
BUN/Creatinine Ratio: 21 (calc) (ref 6–22)
BUN: 33 mg/dL — ABNORMAL HIGH (ref 7–25)
CO2: 29 mmol/L (ref 20–32)
Calcium: 9.7 mg/dL (ref 8.6–10.3)
Chloride: 101 mmol/L (ref 98–110)
Creat: 1.57 mg/dL — ABNORMAL HIGH (ref 0.70–1.28)
Globulin: 2.9 g/dL (ref 1.9–3.7)
Glucose, Bld: 189 mg/dL — ABNORMAL HIGH (ref 65–99)
Potassium: 4 mmol/L (ref 3.5–5.3)
Sodium: 140 mmol/L (ref 135–146)
Total Bilirubin: 1 mg/dL (ref 0.2–1.2)
Total Protein: 7.1 g/dL (ref 6.1–8.1)
eGFR: 47 mL/min/1.73m2 — ABNORMAL LOW (ref >=60)

## 2023-12-01 LAB — LIPID PANEL
Cholesterol: 125 mg/dL (ref <200)
HDL: 39 mg/dL — ABNORMAL LOW (ref >=40)
Non-HDL Cholesterol (Calc): 86 mg/dL (ref <130)
Total CHOL/HDL Ratio: 3.2 (calc) (ref <5.0)
Triglycerides: 411 mg/dL — ABNORMAL HIGH (ref <150)

## 2023-12-01 LAB — HIV-1 RNA QUANT-NO REFLEX-BLD
HIV 1 RNA Quant: NOT DETECTED {copies}/mL
HIV-1 RNA Quant, Log: NOT DETECTED {Log_copies}/mL

## 2023-12-03 ENCOUNTER — Other Ambulatory Visit: Payer: Self-pay | Admitting: Family Medicine

## 2023-12-03 ENCOUNTER — Ambulatory Visit: Admitting: Family Medicine

## 2023-12-03 DIAGNOSIS — Z8669 Personal history of other diseases of the nervous system and sense organs: Secondary | ICD-10-CM

## 2023-12-03 DIAGNOSIS — K146 Glossodynia: Secondary | ICD-10-CM

## 2023-12-03 DIAGNOSIS — K219 Gastro-esophageal reflux disease without esophagitis: Secondary | ICD-10-CM

## 2023-12-03 DIAGNOSIS — Z79899 Other long term (current) drug therapy: Secondary | ICD-10-CM

## 2023-12-03 NOTE — Telephone Encounter (Signed)
 Requested Prescriptions   Pending Prescriptions Disp Refills   pantoprazole  (PROTONIX ) 40 MG tablet [Pharmacy Med Name: PANTOPRAZOLE  40MG  TABLETS] 180 tablet 1    Sig: TAKE 1 TABLET(40 MG) BY MOUTH TWICE DAILY   divalproex  (DEPAKOTE  ER) 500 MG 24 hr tablet [Pharmacy Med Name: DIVALPROEX  EXTENDED RELEASE 500MG  T] 90 tablet 1    Sig: TAKE 1 TABLET(500 MG) BY MOUTH AT BEDTIME     Date of patient request: 12/03/2023 Last office visit: 03/26/2023 Upcoming visit: 12/23/2023 Date of last refill: 03/26/2023 Last refill amount: 90

## 2023-12-07 ENCOUNTER — Other Ambulatory Visit: Payer: Self-pay | Admitting: Cardiovascular Disease

## 2023-12-07 DIAGNOSIS — I70209 Unspecified atherosclerosis of native arteries of extremities, unspecified extremity: Secondary | ICD-10-CM

## 2023-12-07 DIAGNOSIS — E785 Hyperlipidemia, unspecified: Secondary | ICD-10-CM

## 2023-12-15 ENCOUNTER — Other Ambulatory Visit: Payer: Self-pay

## 2023-12-15 DIAGNOSIS — E1122 Type 2 diabetes mellitus with diabetic chronic kidney disease: Secondary | ICD-10-CM

## 2023-12-15 MED ORDER — LANTUS SOLOSTAR 100 UNIT/ML ~~LOC~~ SOPN: 80.0000 [IU] | PEN_INJECTOR | Freq: Every day | SUBCUTANEOUS | 3 refills | Status: AC

## 2023-12-16 ENCOUNTER — Encounter: Payer: Self-pay | Admitting: Gastroenterology

## 2023-12-23 ENCOUNTER — Ambulatory Visit (INDEPENDENT_AMBULATORY_CARE_PROVIDER_SITE_OTHER): Admitting: Family Medicine

## 2023-12-23 ENCOUNTER — Encounter: Payer: Self-pay | Admitting: Family Medicine

## 2023-12-23 VITALS — BP 110/68 | HR 101 | Temp 98.6°F | Resp 11 | Ht 70.0 in | Wt 252.2 lb

## 2023-12-23 DIAGNOSIS — N183 Chronic kidney disease, stage 3 unspecified: Secondary | ICD-10-CM | POA: Diagnosis not present

## 2023-12-23 DIAGNOSIS — I10 Essential (primary) hypertension: Secondary | ICD-10-CM | POA: Diagnosis not present

## 2023-12-23 DIAGNOSIS — R42 Dizziness and giddiness: Secondary | ICD-10-CM

## 2023-12-23 DIAGNOSIS — Z7901 Long term (current) use of anticoagulants: Secondary | ICD-10-CM

## 2023-12-23 MED ORDER — MECLIZINE HCL 25 MG PO TABS: 25.0000 mg | ORAL_TABLET | Freq: Three times a day (TID) | ORAL | 0 refills | Status: DC | PRN

## 2023-12-23 NOTE — Patient Instructions (Signed)
 I suspect that your new dizziness episode is vertigo as you have experienced in the past, since the symptoms are the same as what you have experienced previously.  I did write for meclizine  for now, and have an appointment scheduled for you tomorrow at 215 with Velia Marc, NP  at [facility].  They asked for you to arrive at 2:00 PM.  They can then decide if other testing needed or vestibular rehab to help with recurrent episode of vertigo.  If any new or worsening symptoms in the meantime be seen through the ER.  I will check some labs today, but follow-up with me in the next 2 weeks to review those labs and chronic medical conditions, medications.  Take care.   ENT appointment tomorrow at 215pm  [address] [place], [place] [address] [place] [phone]

## 2023-12-23 NOTE — Progress Notes (Signed)
 Subjective:  Patient ID: Daniel [name], male    DOB: May 24, 1955  Age: 68 y.o. MRN: [id number]  CC:  Chief Complaint  Patient presents with   Gastroesophageal Reflux   Hypertension   Dizziness    Sx started last week. Very dizzy when laying down and loss of balance.    HPI Daniel [name] presents for   Initial follow-up of chronic conditions (last discussed in January)planned,  but acute symptoms as above with dizziness  Dizziness/balance difficulty: Notes worsening symptoms since last week, more dizzy when lying down, has lost balance at times.  Noticed acute change in dizziness starting 3-5 days ago. Woke up, sat up and acute onset of head spinning sensation. No acute vision changes. No acute weakness, no slurred speech or facial droop. No headache. No chest pains or palpitations.  Worse spinning with lying backwards.  Feels like vertigo as he had experienced in the past. Had years ago - had a rx in past and treatment for vertigo through ENT with repositioning maneuvers.  Tx: none  We have discussed balance difficulty, falls, dizziness in the past and likely component of his peripheral neuropathy, and side effects with multiple medications and psychotropic meds.  I have recommended assistive devices such as rollator or walker and this to been discussed with his physical therapist.  I have also referred him to occupational therapy previously.  He has declined a prescription for walker previously.  Based on note from 2023 he was dismissed from physical therapy, was not doing exercises at home.  But at that time was not falling as much as he had prior. Not using assistive device - 1 fall - tripped on carpet in past 6 months. Declines assistive devices at this time - has walker at home if needed.   He does have a history of diabetes followed by endocrinology, treated with Farxiga , Trulicity , Lantus  and NovoLog  with meals.  Note reviewed from July.  He had decreased his dose of Lantus  due to  dropping blood sugars in the afternoon.  CGM trends at July visit were increasing overnight and then improving during the day.  Only 1% lower than 70.  Lantus  dosing was changed to the evening.  Next appointment with endocrinology is on November 20.  History of stage III CKD that has been followed by nephrology, most recent note from March reviewed.  Prior history of acute on chronic renal failure, thought to be in setting of medication specifically losartan , NSAIDs.  Creatinine settled around 1.5-1.7 with GFR in the high 40s off those meds.  Avoiding ACE and ARB due to above concerns.  Recommended creatinine monitoring 2 times per year to head off other episodes of acute kidney injury with his other medications.  Protein was low, kidney function has been stable for almost 10 years and option given of as needed follow-up.  Recent CMP, lipid panel noted from October 2.  A1c from July 22 with endocrinology.  History Patient Active Problem List   Diagnosis Date Noted   Healthcare maintenance 11/26/2023   Type 2 diabetes mellitus with diabetic peripheral angiopathy without gangrene ([name]) 06/23/2022   Routine screening for STI (sexually transmitted infection) 04/29/2022   Mixed hyperlipidemia 09/19/2021   Type II diabetes mellitus with peripheral circulatory disorder ([name]) 09/19/2021   Insomnia due to mental condition 05/23/2021   Atherosclerosis of native artery of extremity 04/25/2021   Chronic nonalcoholic liver disease 04/25/2021   History of hepatitis B 04/25/2021   Long term (current) use of  insulin  ([name]) 04/25/2021   Long term (current) use of anticoagulants 04/25/2021   Nonalcoholic steatohepatitis (NASH) 04/25/2021   Type 2 diabetes mellitus with other circulatory complications ([name]) 04/25/2021   Type 2 diabetes mellitus with peripheral angiopathy ([name]) 04/25/2021   Abnormal liver function tests 04/25/2021   Drug-induced myopathy 02/12/2021   Unilateral primary osteoarthritis, right knee  12/15/2019   Generalized weakness 09/14/2019   Severe major depression ([name]) 11/17/2018   Class 2 obesity due to excess calories without serious comorbidity with body mass index (BMI) of 36.0 to 36.9 in adult 11/17/2018   Anticoagulated 08/24/2018   Dyslipidemia 07/27/2018   Essential hypertension 06/25/2018   Hypogonadism in male 05/10/2018   Numbness 03/31/2018   Diabetic peripheral neuropathy ([name]) 01/25/2018   S/P transmetatarsal amputation of foot, right ([name]) 10/09/2017   Hypothyroidism    Constipation    Obesity, Class III, BMI 40-49.9 (morbid obesity) ([name]) 01/15/2017   DOE (dyspnea on exertion) 01/13/2017   Imbalance 11/18/2016   Osteomyelitis of right foot ([name]) 09/22/2016   Chronic migraine 09/17/2016   Gait abnormality 09/17/2016   Fall 12/05/2015   Toe ulcer, right ([name]) 09/19/2015   Decreased pedal pulses 09/19/2015   OSA on CPAP 09/05/2015   Major depressive disorder, recurrent episode, moderate ([name]) 09/05/2015   Recurrent falls while walking 06/24/2015   H/O migraine 06/22/2015   Sinus headache 06/21/2015   OSA (obstructive sleep apnea) 06/20/2015   History of DVT (deep vein thrombosis) 06/20/2015   History of falling 06/20/2015   Chronic, continuous use of opioids 06/20/2015   Coagulopathy 06/20/2015   Elevated CPK 06/20/2015   Pain syndrome, chronic 06/20/2015   Thrombocythemia 06/20/2015   Uncontrolled type 2 diabetes mellitus with hyperglycemia ([name]) 06/20/2015   Aspiration into airway 06/20/2015   Low back pain 06/11/2015   Unspecified abnormalities of gait and mobility 06/11/2015   Chronic kidney disease, stage III (moderate) ([name]) 08/09/2014   Renal insufficiency 08/09/2014   Insulin -requiring or dependent type II diabetes mellitus ([name]) 11/07/2013   Diabetes mellitus ([name]) 11/07/2013   Abnormal result on screening urine test 09/03/2006   Human immunodeficiency virus (HIV) infection ([name]) 06/04/2006   Herpes zoster 06/04/2006   Depression  06/04/2006   THROMBOPHLEBITIS NOS 06/04/2006   Gastroesophageal reflux disease 06/04/2006   Arthropathy of hand 06/04/2006   Attention deficit hyperactivity disorder 06/04/2006   Past Medical History:  Diagnosis Date   ADHD (attention deficit hyperactivity disorder)    Anxiety    Chronic kidney disease    Clotting disorder    Depression    Diabetes mellitus without complication ([name])    Diabetes mellitus, type II ([name])    Dizziness 03/17/2015   Essential hypertension 06/25/2018   GERD (gastroesophageal reflux disease)    HIV disease ([name])    HIV infection ([name])    Liver disease    OSA (obstructive sleep apnea) 07/25/2015   Uses CPAP regularly   Peripheral vascular disease    Ulcer    Past Surgical History:  Procedure Laterality Date   AMPUTATION Right 10/02/2017   Procedure: RIGHT TRANSMETATARSAL AMPUTATION;  Surgeon: Jerri Kay HERO, MD;  Location: [facility] OR;  Service: Orthopedics;  Laterality: Right;   SMALL INTESTINE SURGERY     STOMACH SURGERY     TOE AMPUTATION Right 08/2016   right great toe   Allergies  Allergen Reactions   Aspirin Swelling   Efavirenz  Swelling and Rash    Other reaction(s): anaphylaxis   Ibuprofen Swelling   Milk-Related Compounds  Other Reaction(s): unpleasant taste   Nsaids Other (See Comments)    unknwn   Pravastatin     myalgias   Lipitor [Atorvastatin  Calcium ] Other (See Comments)    Leg pain   Prior to Admission medications   Medication Sig Start Date End Date Taking? Authorizing Provider  abacavir -dolutegravir -lamiVUDine  (TRIUMEQ ) 600-50-300 MG tablet Take 1 tablet by mouth daily. 11/26/23  Yes Calone, Gregory D, FNP  acetaminophen  (TYLENOL ) 500 MG tablet Take 1,500 mg by mouth in the morning and at bedtime.   Yes [provider]  ALPRAZolam  (XANAX ) 1 MG tablet Take 1 mg by mouth at bedtime. *May take one tablet up to 4 times daily as needed for anxiety   Yes [provider]  amphetamine -dextroamphetamine  (ADDERALL) 30 MG  tablet Take 30 mg by mouth 2 (two) times daily.   Yes [provider]  Continuous Glucose Sensor (FREESTYLE LIBRE 2 SENSOR) MISC CHECK GLUCOSE CONTINUOUSLY CHANGE SENSOR EVERY 14 DAYS. 03/11/23  Yes Trixie File, MD  Dextromethorphan-buPROPion ER (AUVELITY) 45-105 MG TBCR Take 1 tablet by mouth daily at 12 noon.   Yes [provider]  diclofenac  Sodium (VOLTAREN ) 1 % GEL APPLY 2 GM TO EACH KNEE EVERY MORNING AND EVERY NIGHT AT BEDTIME, AND APPLY 1 GM TO EACH KNEE EVERY AFTERNOON 03/26/23  Yes Levora Reyes SAUNDERS, MD  divalproex  (DEPAKOTE  ER) 500 MG 24 hr tablet TAKE 1 TABLET(500 MG) BY MOUTH AT BEDTIME 12/03/23  Yes Levora Reyes SAUNDERS, MD  Dulaglutide  (TRULICITY ) 4.5 MG/0.5ML SOAJ ADMINISTER 4.5 MG UNDER THE SKIN 1 TIME A WEEK 08/14/23  Yes Trixie File, MD  Evolocumab  (REPATHA  SURECLICK) 140 MG/ML SOAJ INJECT UNDER THE SKIN EVERY 14 DAYS 12/08/23  Yes Raford Riggs, MD  ezetimibe  (ZETIA ) 10 MG tablet TAKE 1 TABLET BY MOUTH DAILY 06/24/23  Yes Raford Riggs, MD  FARXIGA  10 MG TABS tablet TAKE 1 TABLET(10 MG) BY MOUTH DAILY BEFORE BREAKFAST 03/03/23  Yes Trixie File, MD  finasteride (PROSCAR) 5 MG tablet Take 5 mg by mouth daily. 04/11/22  Yes [provider]  furosemide  (LASIX ) 40 MG tablet TAKE 60MG  FOR 3 DAYS THEN BACK TO 40MG  DAILY; TAKE 1 TABLET(40 MG) BY MOUTH DAILY Patient taking differently: Take 60 mg by mouth daily. TAKE 60MG  FOR 3 DAYS THEN BACK TO 40MG  DAILY; TAKE 1 TABLET(40 MG) BY MOUTH DAILY 04/21/23  Yes Cleaver, Josefa HERO, NP  insulin  glargine (LANTUS  SOLOSTAR) 100 UNIT/ML Solostar Pen Inject 80 Units into the skin daily. 12/15/23  Yes Trixie File, MD  insulin  lispro (HUMALOG  KWIKPEN) 100 UNIT/ML KwikPen INJECT 15 UNITS WITH BREAKFAST AND 25 UNITS WITH SUPPER 08/26/23  Yes Trixie File, MD  Insulin  Pen Needle 32G X 5 MM MISC Use 3x a day 09/15/23  Yes Trixie File, MD  Lemborexant (DAYVIGO) 10 MG TABS Take 10 mg by mouth at bedtime.    Yes [provider]  levothyroxine  (SYNTHROID , LEVOTHROID) 50 MCG tablet Take 1 tablet (50 mcg total) by mouth at bedtime. 07/24/15  Yes Humberto Elspeth LABOR, MD  pantoprazole  (PROTONIX ) 40 MG tablet TAKE 1 TABLET(40 MG) BY MOUTH TWICE DAILY 12/03/23  Yes Levora Reyes SAUNDERS, MD  pregabalin  (LYRICA ) 300 MG capsule TAKE 1 CAPSULE(300 MG) BY MOUTH TWICE DAILY 11/30/23  Yes Levora Reyes SAUNDERS, MD  silodosin (RAPAFLO) 8 MG CAPS capsule Take 8 mg by mouth daily.   Yes [provider]  TRINTELLIX  5 MG TABS tablet Take 5 mg by mouth daily. Patient taking differently: Take 10 mg by mouth daily.  03/27/23  Yes [provider]  XARELTO  20 MG TABS tablet TAKE 1 TABLET(20 MG) BY MOUTH DAILY WITH SUPPER 09/22/23  Yes Levora Reyes SAUNDERS, MD   Social History   Socioeconomic History   Marital status: Single    Spouse name: Not on file   Number of children: Not on file   Years of education: Not on file   Highest education level: 12th grade  Occupational History    Comment: DISABILITY  Tobacco Use   Smoking status: Former    Current packs/day: 0.00    Average packs/day: 0.1 packs/day for 10.0 years (1.0 ttl pk-yrs)    Types: Cigars, Cigarettes    Start date: 08/08/2004    Quit date: 08/09/2014    Years since quitting: 9.3   Smokeless tobacco: Never  Vaping Use   Vaping status: Never Used  Substance and Sexual Activity   Alcohol use: No    Alcohol/week: 0.0 standard drinks of alcohol   Drug use: No   Sexual activity: Not Currently    Partners: Male    Comment: pt. declined condoms  Other Topics Concern   Not on file  Social History Narrative   Epworth Sleepiness Scale = 7 (as of 03/16/2015)   Social Drivers of Health   Financial Resource Strain: Low Risk  (07/28/2023)   Overall Financial Resource Strain (CARDIA)    Difficulty of Paying Living Expenses: Not very hard  Food Insecurity: No Food Insecurity (07/28/2023)   Hunger Vital Sign    Worried About Running Out of Food in the  Last Year: Never true    Ran Out of Food in the Last Year: Never true  Transportation Needs: No Transportation Needs (07/28/2023)   PRAPARE - Administrator, Civil Service (Medical): No    Lack of Transportation (Non-Medical): No  Physical Activity: Unknown (07/28/2023)   Exercise Vital Sign    Days of Exercise per Week: 0 days    Minutes of Exercise per Session: Not on file  Stress: Stress Concern Present (07/28/2023)   [employer] of Occupational Health - Occupational Stress Questionnaire    Feeling of Stress : To some extent  Social Connections: Socially Isolated (07/28/2023)   Social Connection and Isolation Panel    Frequency of Communication with Friends and Family: Once a week    Frequency of Social Gatherings with Friends and Family: Twice a week    Attends Religious Services: Never    Database Administrator or Organizations: No    Attends Engineer, Structural: Not on file    Marital Status: Never married  Intimate Partner Violence: Not At Risk (10/01/2017)   Humiliation, Afraid, Rape, and Kick questionnaire    Fear of Current or Ex-Partner: No    Emotionally Abused: No    Physically Abused: No    Sexually Abused: No    Review of Systems   Objective:   Vitals:   12/23/23 1445  BP: 110/68  Pulse: (!) 101  Resp: 11  Temp: 98.6 F (37 C)  TempSrc: Temporal  SpO2: 94%  Weight: 252 lb 3.2 oz (114.4 kg)  Height: 5' 10 (1.778 m)     Physical Exam Vitals reviewed.  Constitutional:      Appearance: He is well-developed.  HENT:     Head: Normocephalic and atraumatic.  Eyes:     Extraocular Movements: Extraocular movements intact.     Right eye: Nystagmus (few beats horizontal nystagmus looking left, right. no vertical or rotational component.)  present.     Left eye: Nystagmus present.     Pupils: Pupils are equal, round, and reactive to light.  Neck:     Vascular: No carotid bruit or JVD.  Cardiovascular:     Rate and Rhythm: Normal rate  and regular rhythm.     Heart sounds: Normal heart sounds. No murmur heard. Pulmonary:     Effort: Pulmonary effort is normal.     Breath sounds: Normal breath sounds. No rales.  Musculoskeletal:     Right lower leg: No edema.     Left lower leg: No edema.  Skin:    General: Skin is warm and dry.  Neurological:     General: No focal deficit present.     Mental Status: He is alert and oriented to person, place, and time.     Cranial Nerves: No cranial nerve deficit or facial asymmetry.     Motor: No weakness or pronator drift.  Psychiatric:        Mood and Affect: Mood normal.    Called ENT - has seen ENT, Dr. Carlie in 2018. Available appt with ENT tomorrow afternoon available -  arrive at 2pm for appt at 2:15 with NP Velia Marc for eval.  I personally spent a total of 53 minutes in the care of the patient today including preparing to see the patient, performing a medically appropriate exam/evaluation, counseling and educating, placing orders, referring and communicating with other health care professionals, documenting clinical information in the EHR, and coordinating care.   Assessment & Plan:  Daniel [name] is a 68 y.o. male . Vertigo - Plan: Ambulatory referral to ENT, CBC, Basic metabolic panel with GFR  Stage 3 chronic kidney disease, unspecified whether stage 3a or 3b CKD ([name])  Essential hypertension  Chronic anticoagulation - Plan: CBC  Dizziness  Chronic history of intermittent dizziness, balance issues but recent onset of acute changes, vertiginous symptoms with room spinning.  No other new neurologic symptoms no headache, nonfocal exam.  Suspected peripheral vertigo with lateral nystagmus on exam and similar symptoms in the past that presented the same.  We just discuss option of neuroimaging to look for posterior circulation CVA, but agreed to try meclizine  initially with close ENT follow-up.  Appointment scheduled tomorrow.  Can decide on neuroimaging if needed  at that time as well as vestibular rehab, canalith repositioning if not improved.  ER precautions given in the interim.  Check labs as above given his chronic use of anticoagulation, check electrolytes, recent CMP lipids noted.   Recheck in 2 weeks to review chronic medical conditions.  .  No orders of the defined types were placed in this encounter.  Patient Instructions  I suspect that your new dizziness episode is vertigo as you have experienced in the past, since the symptoms are the same as what you have experienced previously.  I did write for meclizine  for now, and have an appointment scheduled for you tomorrow at 215 with Velia Marc, NP  at [facility].  They asked for you to arrive at 2:00 PM.  They can then decide if other testing needed or vestibular rehab to help with recurrent episode of vertigo.  If any new or worsening symptoms in the meantime be seen through the ER.  I will check some labs today, but follow-up with me in the next 2 weeks to review those labs and chronic medical conditions, medications.  Take care.   ENT appointment tomorrow at 215pm  [address] [place], [place] [address] [place] [address] [phone]    Signed,   Reyes Pines, MD [facility], [facility] 12/23/23 3:51 PM

## 2023-12-24 ENCOUNTER — Telehealth: Payer: Self-pay

## 2023-12-24 ENCOUNTER — Ambulatory Visit: Payer: Self-pay | Admitting: Family Medicine

## 2023-12-24 LAB — BASIC METABOLIC PANEL WITH GFR
BUN: 33 mg/dL — ABNORMAL HIGH (ref 6–23)
CO2: 31 meq/L (ref 19–32)
Calcium: 9.8 mg/dL (ref 8.4–10.5)
Chloride: 100 meq/L (ref 96–112)
Creatinine, Ser: 1.72 mg/dL — ABNORMAL HIGH (ref 0.40–1.50)
GFR: 39.84 mL/min — ABNORMAL LOW (ref >60.00)
Glucose, Bld: 160 mg/dL — ABNORMAL HIGH (ref 70–99)
Potassium: 4.4 meq/L (ref 3.5–5.1)
Sodium: 139 meq/L (ref 135–145)

## 2023-12-24 LAB — CBC
HCT: 51.5 % (ref 39.0–52.0)
Hemoglobin: 18 g/dL (ref 13.0–17.0)
MCHC: 34.9 g/dL (ref 30.0–36.0)
MCV: 97.1 fl (ref 78.0–100.0)
Platelets: 159 K/uL (ref 150.0–400.0)
RBC: 5.3 Mil/uL (ref 4.22–5.81)
RDW: 14.1 % (ref 11.5–15.5)
WBC: 7.5 K/uL (ref 4.0–10.5)

## 2023-12-24 NOTE — Telephone Encounter (Signed)
 Sent to PCP ?

## 2023-12-24 NOTE — Telephone Encounter (Signed)
 See result note.

## 2023-12-24 NOTE — Telephone Encounter (Signed)
 Called patient to relay lab results. Left vm to return call

## 2023-12-24 NOTE — Telephone Encounter (Signed)
 Noted, see lab notes regarding plan.

## 2023-12-25 NOTE — Telephone Encounter (Unsigned)
 Copied from CRM [id number]. Topic: Clinical - Lab/Test Results >> Dec 25, 2023  3:58 PM Rea ORN wrote: Reason for CRM: Pt returned missed call from [facility]. Pt given lab results and advised to be well hydrated for next appt with PCP so that labs can be redrawn.

## 2023-12-25 NOTE — Progress Notes (Signed)
Called patient and left vm to return call.

## 2023-12-25 NOTE — Telephone Encounter (Signed)
 Cb patient. Left vm to return call. Please relay Dr. Valorie message if he calls back  Copied from CRM [id number]. Topic: General - Other >> Dec 25, 2023  3:21 PM Paige D wrote: Reason for CRM: pt calling back to speak to miss Megan in regards to a VM he received. Called CAL no answer. Please reach out to pt at earliest convenience

## 2023-12-28 ENCOUNTER — Other Ambulatory Visit (HOSPITAL_COMMUNITY): Payer: Self-pay | Admitting: Physician Assistant

## 2023-12-28 DIAGNOSIS — R42 Dizziness and giddiness: Secondary | ICD-10-CM | POA: Diagnosis not present

## 2023-12-28 DIAGNOSIS — R1310 Dysphagia, unspecified: Secondary | ICD-10-CM

## 2023-12-29 ENCOUNTER — Encounter: Payer: Self-pay | Admitting: Podiatry

## 2023-12-29 ENCOUNTER — Other Ambulatory Visit: Payer: Self-pay | Admitting: Family Medicine

## 2023-12-29 ENCOUNTER — Ambulatory Visit: Admitting: Podiatry

## 2023-12-29 DIAGNOSIS — L601 Onycholysis: Secondary | ICD-10-CM | POA: Diagnosis not present

## 2023-12-29 DIAGNOSIS — N1832 Chronic kidney disease, stage 3b: Secondary | ICD-10-CM

## 2023-12-29 DIAGNOSIS — E1142 Type 2 diabetes mellitus with diabetic polyneuropathy: Secondary | ICD-10-CM

## 2023-12-29 DIAGNOSIS — Z89431 Acquired absence of right foot: Secondary | ICD-10-CM | POA: Diagnosis not present

## 2023-12-29 DIAGNOSIS — M792 Neuralgia and neuritis, unspecified: Secondary | ICD-10-CM

## 2023-12-29 DIAGNOSIS — E1151 Type 2 diabetes mellitus with diabetic peripheral angiopathy without gangrene: Secondary | ICD-10-CM | POA: Diagnosis not present

## 2023-12-30 ENCOUNTER — Encounter: Payer: Self-pay | Admitting: Podiatry

## 2023-12-30 NOTE — Telephone Encounter (Signed)
 Requested Prescriptions   Pending Prescriptions Disp Refills   pregabalin  (LYRICA ) 300 MG capsule [Pharmacy Med Name: PREGABALIN  300MG  CAPSULES] 60 capsule     Sig: TAKE 1 CAPSULE(300 MG) BY MOUTH TWICE DAILY     Date of patient request: 12/30/23 Last office visit: 12/23/2023 Upcoming visit: 01/06/2024 Date of last refill: 11/30/23 Last refill amount: 60

## 2023-12-30 NOTE — Progress Notes (Signed)
 This patient presents to the office with concern of his left great toenail.  He says the toenail is not properly attached to the nail bed.  He also has thick disfigured toenails on his left foot.  Patient has not been seen for years and has had TMA right foot.  He has history of diabetes, CKD and coagulation defect.  He presents to the office for evaluation and treatment.  General Appearance  Alert, conversant and in no acute stress.  Vascular  Dorsalis pedis and posterior tibial  pulses are weakly  palpable  bilaterally.  Capillary return is within normal limits  bilaterally. Temperature is within normal limits  bilaterally.  Neurologic  Senn-Weinstein monofilament wire test diminished   bilaterally. Muscle power within normal limits bilaterally.  Nails Thick disfigured discolored nails with subungual debris  from hallux to fifth toes left foot.. No evidence of bacterial infection or drainage bilaterally. His nail plate left hallux is unattached to the nail bed with no evidence of drainage or infection.  Orthopedic  No limitations of motion  feet .  No crepitus or effusions noted.  No bony pathology or digital deformities noted.  Skin  normotropic skin with no porokeratosis noted bilaterally.  No signs of infections or ulcers noted.     Onychomycosis  Separation left hallux toenail  DM  IE.  Debridement of nails left foot.  Debridement of unattached left hallux nail down to the proximal nail left hallux.  RTC 3 months

## 2023-12-31 NOTE — Telephone Encounter (Signed)
 Controlled substance database reviewed.  #60 of Lyrica  last filled on 12/04/2023, previously 11/03/2023.  Refill ordered.

## 2024-01-06 ENCOUNTER — Ambulatory Visit: Admitting: Family Medicine

## 2024-01-08 ENCOUNTER — Ambulatory Visit: Admitting: Family Medicine

## 2024-01-08 VITALS — BP 130/70 | HR 91 | Temp 98.8°F | Resp 16 | Ht 70.0 in | Wt 259.4 lb

## 2024-01-08 DIAGNOSIS — E1142 Type 2 diabetes mellitus with diabetic polyneuropathy: Secondary | ICD-10-CM | POA: Diagnosis not present

## 2024-01-08 DIAGNOSIS — G8929 Other chronic pain: Secondary | ICD-10-CM

## 2024-01-08 DIAGNOSIS — K219 Gastro-esophageal reflux disease without esophagitis: Secondary | ICD-10-CM

## 2024-01-08 DIAGNOSIS — M25562 Pain in left knee: Secondary | ICD-10-CM | POA: Diagnosis not present

## 2024-01-08 DIAGNOSIS — E782 Mixed hyperlipidemia: Secondary | ICD-10-CM | POA: Diagnosis not present

## 2024-01-08 DIAGNOSIS — I824Y9 Acute embolism and thrombosis of unspecified deep veins of unspecified proximal lower extremity: Secondary | ICD-10-CM | POA: Diagnosis not present

## 2024-01-08 DIAGNOSIS — Z8669 Personal history of other diseases of the nervous system and sense organs: Secondary | ICD-10-CM | POA: Diagnosis not present

## 2024-01-08 DIAGNOSIS — N183 Chronic kidney disease, stage 3 unspecified: Secondary | ICD-10-CM | POA: Diagnosis not present

## 2024-01-08 DIAGNOSIS — M25561 Pain in right knee: Secondary | ICD-10-CM

## 2024-01-08 DIAGNOSIS — Z7901 Long term (current) use of anticoagulants: Secondary | ICD-10-CM | POA: Diagnosis not present

## 2024-01-08 DIAGNOSIS — I739 Peripheral vascular disease, unspecified: Secondary | ICD-10-CM

## 2024-01-08 DIAGNOSIS — R42 Dizziness and giddiness: Secondary | ICD-10-CM

## 2024-01-08 DIAGNOSIS — D582 Other hemoglobinopathies: Secondary | ICD-10-CM | POA: Diagnosis not present

## 2024-01-08 DIAGNOSIS — I10 Essential (primary) hypertension: Secondary | ICD-10-CM | POA: Diagnosis not present

## 2024-01-08 DIAGNOSIS — R600 Localized edema: Secondary | ICD-10-CM

## 2024-01-08 LAB — BASIC METABOLIC PANEL WITH GFR
BUN: 29 mg/dL — ABNORMAL HIGH (ref 6–23)
CO2: 31 meq/L (ref 19–32)
Calcium: 9.5 mg/dL (ref 8.4–10.5)
Chloride: 99 meq/L (ref 96–112)
Creatinine, Ser: 1.69 mg/dL — ABNORMAL HIGH (ref 0.40–1.50)
GFR: 40.67 mL/min — ABNORMAL LOW (ref >60.00)
Glucose, Bld: 170 mg/dL — ABNORMAL HIGH (ref 70–99)
Potassium: 4 meq/L (ref 3.5–5.1)
Sodium: 138 meq/L (ref 135–145)

## 2024-01-08 LAB — CBC
HCT: 49.5 % (ref 39.0–52.0)
Hemoglobin: 17.4 g/dL — ABNORMAL HIGH (ref 13.0–17.0)
MCHC: 35.1 g/dL (ref 30.0–36.0)
MCV: 96.4 fl (ref 78.0–100.0)
Platelets: 133 K/uL — ABNORMAL LOW (ref 150.0–400.0)
RBC: 5.14 Mil/uL (ref 4.22–5.81)
RDW: 14.3 % (ref 11.5–15.5)
WBC: 7.1 K/uL (ref 4.0–10.5)

## 2024-01-08 MED ORDER — RIVAROXABAN 20 MG PO TABS: 20.0000 mg | ORAL_TABLET | Freq: Every day | ORAL | 2 refills | Status: AC

## 2024-01-08 NOTE — Patient Instructions (Signed)
 Thank you for coming in today.  Keep follow-up with specialist as planned.  I did refer you to a vascular specialist to evaluate the circulation in your left foot.  Make sure to discuss your heartburn and reflux symptoms with gastroenterology at upcoming appointment as they may need to make some changes based on your breakthrough symptoms on current dosage of medication.  I am rechecking the kidney function test as it had crept up slightly from previous range but similar to range seen in nephrology.  I am also rechecking your blood counts since the hemoglobin was still running a little bit high.  No other med changes at this time.  Follow-up in 6 months, let me know if you have questions.  Take care!

## 2024-01-08 NOTE — Progress Notes (Signed)
 Subjective:  Patient ID: Daniel [name], male    DOB: 08/16/1955  Age: 68 y.o. MRN: [id number]  CC:  Chief Complaint  Patient presents with   Medical Management of Chronic Issues    Pt last visit changed to acute due to vertigo pt needs chronic condition follow up     HPI Daniel [name] presents for  Chronic condition follow-up.  Last visit October 29, acute symptoms at that time of vertigo.  Labs obtained in preparation for today's visit.  Vertigo Chronic intermittent dizziness and balance issues but with acute changes last visit suspected peripheral vertigo, less likely posterior circulation CVA, nonfocal neuroexam except peripheral vertigo symptoms with lateral nystagmus, reproducible in office.  He was referred acutely to ENT for evaluation and possible vestibular rehab, canalith repositioning. Appointment noted November 3 with ENT, scheduled for vestibular testing for suspected BPPV.  Also discussed dysphagia, fiberoptic exam of larynx with mild posterior edema otherwise normal exam, reflux precautions discussed and recommended barium swallow for further evaluation.  This will be performed on November 20. Will be having balance/vertigo treatment on 11/25. Not taking meclizine  - ENT recommended against it.   He is followed by infectious disease with asymptomatic HIV infection, endocrinology with diabetes, dermatology with chronic venous insufficiency, neurology with OSA on CPAP, nephrology with history of CKD with as needed follow-up given that his kidney function had been stable.  Also followed by psychiatry.  Peripheral neuropathy with history of diabetes As above, followed by endocrinology for his diabetes treatment.  We have been treating him with Lyrica  300 mg twice daily for peripheral neuropathy.  Risk of falls discussed previously, PT, OT referrals previously and use of walker as needed.  He was suggested his walking and speed of walking to minimize risk of falling.  1 fall in  the past 6 months when he had tripped on the carpet but had otherwise been stable. No other falls. Still some pain in feet  inbetween doses - to 1 hour before next dose. On max dose lyrica . Works better with tylenol . Ok with current regimen - offered pain mgt eval, declined.  Saw podiatrist last week - toe pain on left. No concerns. Small toe - few years. NKI. Great toenail removed. No current foot pain.  6 tylenol  per day.  Lab Results  Component Value Date   ALT 34 11/26/2023   AST 22 11/26/2023   ALKPHOS 93 03/26/2023   BILITOT 1.0 11/26/2023   Chronic knee pain Managed with Voltaren  gel twice per day. Still working ok.   Hypertension: With CKD, previously followed by nephrology but with stable renal function option of as needed follow-up.  Creatinine range approximately 1.5-1.7 with GFR in the high 40s.  Off losartan  and NSAIDs.  Creatinine monitoring twice per year recommended by nephrology to head off episodes of AKI with his other medications.  Uses CPAP for OSA. On furosemide  60mg  every day - choric edema with chronic venous insufficiency. Cardiology - Dr. Raford.  Home readings: none.  Creat 1.57-1.72 on most recent labs.  BP Readings from Last 3 Encounters:  01/08/24 130/70  12/23/23 110/68  11/26/23 130/76   Lab Results  Component Value Date   CREATININE 1.72 (H) 12/23/2023   GERD: Has been treated with Protonix  up to twice per day, avoidance of trigger foods, recent ENT eval as above with plan for barium swallow for dysphagia. Has appt with GI on 12/11. Breakthrough heartburn. And tongue soreness in night - taking Tums multiple times  per day.   Chronic anticoagulation with history of DVT Treated with Xarelto  20 mg daily without any bleeding chest pain or dyspnea.  Consistent dosing of Xarelto .  Hemoglobin borderline elevated on most recent labs 10/29. Lab Results  Component Value Date   WBC 7.5 12/23/2023   HGB 18.0 Repeated and verified X2. (HH) 12/23/2023    HCT 51.5 12/23/2023   MCV 97.1 12/23/2023   PLT 159.0 12/23/2023    Psychiatric Followed by Dr. Kirt and has been treated with Rexulti , Xanax , Auvelity, amphetamine , Dayvigo, levothyroxine , Trintellix . Has also been on Depakote  for history of migraines without recent migraine or new side effects to meds.  BPH with LUTS Has been evaluated by urology, Dr. Rosalind, now Dr. Lovie. treated with finasteride, and silodosin with effective relief. Still taking and effective.   Hyperlipidemia: Did not tolerate statin. Taking zetia . Elevated trigs in past and hx of DM with hyperglycemia. LDL 20 in January of this year.  Lab Results  Component Value Date   CHOL 125 11/26/2023   HDL 39 (L) 11/26/2023   LDLCALC  11/26/2023     Comment:     . LDL cholesterol not calculated. Triglyceride levels greater than 400 mg/dL invalidate calculated LDL results. . Reference range: <100 . Desirable range <100 mg/dL for primary prevention;   <70 mg/dL for patients with CHD or diabetic patients  with > or = 2 CHD risk factors. SABRA LDL-C is now calculated using the Martin-Hopkins  calculation, which is a validated novel method providing  better accuracy than the Friedewald equation in the  estimation of LDL-C.  Gladis APPLETHWAITE et al. SANDREA. 7986;689(80): 2061-2068  (http://education.[facility].com/faq/FAQ164)    TRIG 411 (H) 11/26/2023   CHOLHDL 3.2 11/26/2023   Lab Results  Component Value Date   ALT 34 11/26/2023   AST 22 11/26/2023   ALKPHOS 93 03/26/2023   BILITOT 1.0 11/26/2023      Health maintenance: Ophthalmology exam: due - has appt 12/15.  Also due for annual wellness visit. Will schedule.     History Patient Active Problem List   Diagnosis Date Noted   Dysphagia 12/28/2023   Healthcare maintenance 11/26/2023   Type 2 diabetes mellitus with diabetic peripheral angiopathy without gangrene ([name]) 06/23/2022   Routine screening for STI (sexually transmitted infection) 04/29/2022    Mixed hyperlipidemia 09/19/2021   Type II diabetes mellitus with peripheral circulatory disorder ([name]) 09/19/2021   Insomnia due to mental condition 05/23/2021   Atherosclerosis of native artery of extremity 04/25/2021   Chronic nonalcoholic liver disease 04/25/2021   History of hepatitis B 04/25/2021   Long term (current) use of insulin  ([name]) 04/25/2021   Long term (current) use of anticoagulants 04/25/2021   Nonalcoholic steatohepatitis (NASH) 04/25/2021   Type 2 diabetes mellitus with other circulatory complications ([name]) 04/25/2021   Type 2 diabetes mellitus with peripheral angiopathy ([name]) 04/25/2021   Abnormal liver function tests 04/25/2021   Drug-induced myopathy 02/12/2021   Unilateral primary osteoarthritis, right knee 12/15/2019   Generalized weakness 09/14/2019   Severe major depression ([name]) 11/17/2018   Class 2 obesity due to excess calories without serious comorbidity with body mass index (BMI) of 36.0 to 36.9 in adult 11/17/2018   Anticoagulated 08/24/2018   Dyslipidemia 07/27/2018   Essential hypertension 06/25/2018   Hypogonadism in male 05/10/2018   Numbness 03/31/2018   Diabetic peripheral neuropathy ([name]) 01/25/2018   S/P transmetatarsal amputation of foot, right ([name]) 10/09/2017   Hypothyroidism    Constipation    Obesity, Class III, BMI  40-49.9 (morbid obesity) ([name]) 01/15/2017   DOE (dyspnea on exertion) 01/13/2017   Imbalance 11/18/2016   Osteomyelitis of right foot ([name]) 09/22/2016   Chronic migraine 09/17/2016   Gait abnormality 09/17/2016   Fall 12/05/2015   Toe ulcer, right ([name]) 09/19/2015   Decreased pedal pulses 09/19/2015   OSA on CPAP 09/05/2015   Major depressive disorder, recurrent episode, moderate ([name]) 09/05/2015   Recurrent falls while walking 06/24/2015   H/O migraine 06/22/2015   Sinus headache 06/21/2015   OSA (obstructive sleep apnea) 06/20/2015   History of DVT (deep vein thrombosis) 06/20/2015   History of falling 06/20/2015    Chronic, continuous use of opioids 06/20/2015   Coagulopathy 06/20/2015   Elevated CPK 06/20/2015   Pain syndrome, chronic 06/20/2015   Thrombocythemia 06/20/2015   Uncontrolled type 2 diabetes mellitus with hyperglycemia ([name]) 06/20/2015   Aspiration into airway 06/20/2015   Low back pain 06/11/2015   Unspecified abnormalities of gait and mobility 06/11/2015   Chronic kidney disease, stage III (moderate) ([name]) 08/09/2014   Renal insufficiency 08/09/2014   Insulin -requiring or dependent type II diabetes mellitus ([name]) 11/07/2013   Diabetes mellitus ([name]) 11/07/2013   Abnormal result on screening urine test 09/03/2006   Human immunodeficiency virus (HIV) infection ([name]) 06/04/2006   Herpes zoster 06/04/2006   Depression 06/04/2006   THROMBOPHLEBITIS NOS 06/04/2006   Gastroesophageal reflux disease 06/04/2006   Arthropathy of hand 06/04/2006   Attention deficit hyperactivity disorder 06/04/2006   Past Medical History:  Diagnosis Date   ADHD (attention deficit hyperactivity disorder)    Anxiety    Chronic kidney disease    Clotting disorder    Depression    Diabetes mellitus without complication ([name])    Diabetes mellitus, type II ([name])    Dizziness 03/17/2015   Essential hypertension 06/25/2018   GERD (gastroesophageal reflux disease)    HIV disease ([name])    HIV infection ([name])    Liver disease    OSA (obstructive sleep apnea) 07/25/2015   Uses CPAP regularly   Peripheral vascular disease    Ulcer    Past Surgical History:  Procedure Laterality Date   AMPUTATION Right 10/02/2017   Procedure: RIGHT TRANSMETATARSAL AMPUTATION;  Surgeon: Jerri Kay HERO, MD;  Location: [facility] OR;  Service: Orthopedics;  Laterality: Right;   SMALL INTESTINE SURGERY     STOMACH SURGERY     TOE AMPUTATION Right 08/2016   right great toe   Allergies  Allergen Reactions   Aspirin Swelling   Efavirenz  Swelling and Rash    Other reaction(s): anaphylaxis   Ibuprofen Swelling   Milk-Related Compounds      Other Reaction(s): unpleasant taste   Nsaids Other (See Comments)    unknwn   Pravastatin     myalgias   Lipitor [Atorvastatin  Calcium ] Other (See Comments)    Leg pain   Prior to Admission medications   Medication Sig Start Date End Date Taking? Authorizing Provider  abacavir -dolutegravir -lamiVUDine  (TRIUMEQ ) 600-50-300 MG tablet Take 1 tablet by mouth daily. 11/26/23  Yes Calone, Gregory D, FNP  acetaminophen  (TYLENOL ) 500 MG tablet Take 1,500 mg by mouth in the morning and at bedtime.   Yes [provider]  ALPRAZolam  (XANAX ) 1 MG tablet Take 1 mg by mouth at bedtime. *May take one tablet up to 4 times daily as needed for anxiety   Yes [provider]  amphetamine -dextroamphetamine  (ADDERALL) 30 MG tablet Take 30 mg by mouth 2 (two) times daily.   Yes [provider]  Continuous Glucose  Sensor (FREESTYLE LIBRE 2 SENSOR) MISC CHECK GLUCOSE CONTINUOUSLY CHANGE SENSOR EVERY 14 DAYS. 03/11/23  Yes Trixie File, MD  Dextromethorphan-buPROPion ER (AUVELITY) 45-105 MG TBCR Take 1 tablet by mouth daily at 12 noon.   Yes [provider]  diclofenac  Sodium (VOLTAREN ) 1 % GEL APPLY 2 GM TO EACH KNEE EVERY MORNING AND EVERY NIGHT AT BEDTIME, AND APPLY 1 GM TO EACH KNEE EVERY AFTERNOON 03/26/23  Yes Levora Reyes SAUNDERS, MD  divalproex  (DEPAKOTE  ER) 500 MG 24 hr tablet TAKE 1 TABLET(500 MG) BY MOUTH AT BEDTIME 12/03/23  Yes Levora Reyes SAUNDERS, MD  Dulaglutide  (TRULICITY ) 4.5 MG/0.5ML SOAJ ADMINISTER 4.5 MG UNDER THE SKIN 1 TIME A WEEK 08/14/23  Yes Trixie File, MD  Evolocumab  (REPATHA  SURECLICK) 140 MG/ML SOAJ INJECT UNDER THE SKIN EVERY 14 DAYS 12/08/23  Yes Raford Riggs, MD  ezetimibe  (ZETIA ) 10 MG tablet TAKE 1 TABLET BY MOUTH DAILY 06/24/23  Yes Raford Riggs, MD  FARXIGA  10 MG TABS tablet TAKE 1 TABLET(10 MG) BY MOUTH DAILY BEFORE BREAKFAST 03/03/23  Yes Trixie File, MD  finasteride (PROSCAR) 5 MG tablet Take 5 mg by mouth daily. 04/11/22  Yes  [provider]  furosemide  (LASIX ) 40 MG tablet TAKE 60MG  FOR 3 DAYS THEN BACK TO 40MG  DAILY; TAKE 1 TABLET(40 MG) BY MOUTH DAILY Patient taking differently: Take 60 mg by mouth daily. TAKE 60MG  FOR 3 DAYS THEN BACK TO 40MG  DAILY; TAKE 1 TABLET(40 MG) BY MOUTH DAILY 04/21/23  Yes Cleaver, Josefa HERO, NP  insulin  glargine (LANTUS  SOLOSTAR) 100 UNIT/ML Solostar Pen Inject 80 Units into the skin daily. 12/15/23  Yes Trixie File, MD  insulin  lispro (HUMALOG  KWIKPEN) 100 UNIT/ML KwikPen INJECT 15 UNITS WITH BREAKFAST AND 25 UNITS WITH SUPPER 08/26/23  Yes Trixie File, MD  Insulin  Pen Needle 32G X 5 MM MISC Use 3x a day 09/15/23  Yes Trixie File, MD  Lemborexant (DAYVIGO) 10 MG TABS Take 10 mg by mouth at bedtime.   Yes [provider]  levothyroxine  (SYNTHROID , LEVOTHROID) 50 MCG tablet Take 1 tablet (50 mcg total) by mouth at bedtime. 07/24/15  Yes Humberto Elspeth LABOR, MD  pantoprazole  (PROTONIX ) 40 MG tablet TAKE 1 TABLET(40 MG) BY MOUTH TWICE DAILY 12/03/23  Yes Levora Reyes SAUNDERS, MD  pregabalin  (LYRICA ) 300 MG capsule TAKE 1 CAPSULE(300 MG) BY MOUTH TWICE DAILY 12/31/23  Yes Levora Reyes SAUNDERS, MD  silodosin (RAPAFLO) 8 MG CAPS capsule Take 8 mg by mouth daily.   Yes [provider]  TRINTELLIX  5 MG TABS tablet Take 5 mg by mouth daily. Patient taking differently: Take 10 mg by mouth daily. 03/27/23  Yes [provider]  XARELTO  20 MG TABS tablet TAKE 1 TABLET(20 MG) BY MOUTH DAILY WITH SUPPER 09/22/23  Yes Levora Reyes SAUNDERS, MD  meclizine  (ANTIVERT ) 25 MG tablet Take 1 tablet (25 mg total) by mouth 3 (three) times daily as needed for dizziness. Patient not taking: Reported on 01/08/2024 12/23/23   Levora Reyes SAUNDERS, MD   Social History   Socioeconomic History   Marital status: Single    Spouse name: Not on file   Number of children: Not on file   Years of education: Not on file   Highest education level: 12th grade  Occupational History    Comment:  DISABILITY  Tobacco Use   Smoking status: Former    Current packs/day: 0.00    Average packs/day: 0.1 packs/day for 10.0 years (1.0 ttl pk-yrs)    Types: Cigars, Cigarettes  Start date: 08/08/2004    Quit date: 08/09/2014    Years since quitting: 9.4   Smokeless tobacco: Never  Vaping Use   Vaping status: Never Used  Substance and Sexual Activity   Alcohol use: No    Alcohol/week: 0.0 standard drinks of alcohol   Drug use: No   Sexual activity: Not Currently    Partners: Male    Comment: pt. declined condoms  Other Topics Concern   Not on file  Social History Narrative   Epworth Sleepiness Scale = 7 (as of 03/16/2015)   Social Drivers of Health   Financial Resource Strain: Low Risk  (01/02/2024)   Overall Financial Resource Strain (CARDIA)    Difficulty of Paying Living Expenses: Not hard at all  Food Insecurity: No Food Insecurity (01/02/2024)   Hunger Vital Sign    Worried About Running Out of Food in the Last Year: Never true    Ran Out of Food in the Last Year: Never true  Transportation Needs: Unmet Transportation Needs (01/02/2024)   PRAPARE - Administrator, Civil Service (Medical): Yes    Lack of Transportation (Non-Medical): No  Physical Activity: Inactive (01/02/2024)   Exercise Vital Sign    Days of Exercise per Week: 0 days    Minutes of Exercise per Session: Not on file  Stress: No Stress Concern Present (01/02/2024)   [employer] of Occupational Health - Occupational Stress Questionnaire    Feeling of Stress: Only a little  Social Connections: Socially Isolated (01/02/2024)   Social Connection and Isolation Panel    Frequency of Communication with Friends and Family: Three times a week    Frequency of Social Gatherings with Friends and Family: Once a week    Attends Religious Services: Never    Database Administrator or Organizations: No    Attends Engineer, Structural: Not on file    Marital Status: Never married  Intimate Partner  Violence: Not At Risk (10/01/2017)   Humiliation, Afraid, Rape, and Kick questionnaire    Fear of Current or Ex-Partner: No    Emotionally Abused: No    Physically Abused: No    Sexually Abused: No    Review of Systems Per HPI  Objective:   Vitals:   01/08/24 0933  BP: 130/70  Pulse: 91  Resp: 16  Temp: 98.8 F (37.1 C)  TempSrc: Temporal  SpO2: 96%  Weight: 259 lb 6.4 oz (117.7 kg)  Height: 5' 10 (1.778 m)     Physical Exam Vitals reviewed.  Constitutional:      Appearance: He is well-developed.  HENT:     Head: Normocephalic and atraumatic.  Neck:     Vascular: No carotid bruit or JVD.  Cardiovascular:     Rate and Rhythm: Normal rate and regular rhythm.     Heart sounds: Normal heart sounds. No murmur heard. Pulmonary:     Effort: Pulmonary effort is normal.     Breath sounds: Normal breath sounds. No rales.  Musculoskeletal:     Right lower leg: Edema present.     Left lower leg: Edema present.     Comments: See photo, some darkening of left lower foot, he reports this has been the same for the past year.  Nontender. Pedal edema bilaterally, lower extremities.  Skin:    General: Skin is warm and dry.  Neurological:     Mental Status: He is alert and oriented to person, place, and time.  Psychiatric:  Mood and Affect: Mood normal.     I personally spent a total of 44 minutes in the care of the patient today including preparing to see the patient, performing a medically appropriate exam/evaluation, and placing orders, review and treatment of multiple conditions as above and referral for additional conditions and concerns, chart completion     Assessment & Plan:  Daniel [name] is a 68 y.o. male . Diabetic peripheral neuropathy ([name]) Pedal edema - Peripheral vascular disease - Plan: Ambulatory referral to Vascular  Surgery  - Chronic peripheral neuropathy managed with Lyrica .  Tolerating current dose without new side effects.  Caution regarding  balance issues and fall risk discussed along with assistive devices at home.  Denies new symptoms or side effects.  - Chronic pedal edema and PVD, referral placed to vascular to evaluate for additional treatment options or testing for peripheral circulation.  Does not appear to have critical limb ischemia at this time but some darkening of feet as above.  ER/RTC precautions if any acute changes.  Vertigo  - Improved, has follow-up with ENT as planned, hold on meclizine  at this time.  Stage 3 chronic kidney disease, unspecified whether stage 3a or 3b CKD ([name]) - Plan: Basic metabolic panel with GFR  - Avoid nephrotoxins, maintain hydration, check labs and adjust plan accordingly.  Continue monitor intermittently, per nephrology recommendations previously.  Essential hypertension  - Stable with current regimen, continue to monitor, recent labs as above.  Chronic anticoagulation Deep vein thrombosis (DVT) of proximal lower extremity, unspecified chronicity, unspecified laterality ([name]) - Plan: rivaroxaban  (XARELTO ) 20 MG TABS tablet  - Recent labs as above, denies any new bruising/bleeding.  History of migraine  - Stable with current med regimen.  Continue to monitor.  Gastroesophageal reflux disease, unspecified whether esophagitis present  - Stable with current med regimen.  Mixed hyperlipidemia  - Tolerating Zetia .  Labs as above.  Elevated hemoglobin - Plan: CBC  - Repeat CBC ordered with prior elevated hemoglobin and adjust plan accordingly.  Chronic pain of both knees  - Stable control with diclofenac  gel.  Chronic GERD  - Given intermittent breakthrough symptoms with current med regimen advised to discuss with gastroenterology to decide if other testing or change in treatment indicated. Meds ordered this encounter  Medications   rivaroxaban  (XARELTO ) 20 MG TABS tablet    Sig: Take 1 tablet (20 mg total) by mouth daily with supper.    Dispense:  90 tablet    Refill:  2    Patient Instructions  Thank you for coming in today.  Keep follow-up with specialist as planned.  I did refer you to a vascular specialist to evaluate the circulation in your left foot.  Make sure to discuss your heartburn and reflux symptoms with gastroenterology at upcoming appointment as they may need to make some changes based on your breakthrough symptoms on current dosage of medication.  I am rechecking the kidney function test as it had crept up slightly from previous range but similar to range seen in nephrology.  I am also rechecking your blood counts since the hemoglobin was still running a little bit high.  No other med changes at this time.  Follow-up in 6 months, let me know if you have questions.  Take care!    Signed,   Reyes Pines, MD [facility], [facility] 01/08/24 10:24 AM

## 2024-01-09 ENCOUNTER — Ambulatory Visit: Payer: Self-pay | Admitting: Family Medicine

## 2024-01-12 ENCOUNTER — Encounter: Payer: Self-pay | Admitting: Family Medicine

## 2024-01-14 ENCOUNTER — Ambulatory Visit (HOSPITAL_COMMUNITY)
Admission: RE | Admit: 2024-01-14 | Discharge: 2024-01-14 | Disposition: A | Discharge status: Home / Self Care | Source: Ambulatory Visit | Attending: Physician Assistant | Admitting: Physician Assistant

## 2024-01-14 ENCOUNTER — Ambulatory Visit: Admitting: Internal Medicine

## 2024-01-14 ENCOUNTER — Encounter: Payer: Self-pay | Admitting: Internal Medicine

## 2024-01-14 VITALS — BP 118/70 | HR 98 | Ht 70.0 in | Wt 259.0 lb

## 2024-01-14 DIAGNOSIS — E782 Mixed hyperlipidemia: Secondary | ICD-10-CM

## 2024-01-14 DIAGNOSIS — R1310 Dysphagia, unspecified: Secondary | ICD-10-CM | POA: Insufficient documentation

## 2024-01-14 DIAGNOSIS — Z794 Long term (current) use of insulin: Secondary | ICD-10-CM

## 2024-01-14 DIAGNOSIS — E1122 Type 2 diabetes mellitus with diabetic chronic kidney disease: Secondary | ICD-10-CM | POA: Diagnosis not present

## 2024-01-14 DIAGNOSIS — N1831 Chronic kidney disease, stage 3a: Secondary | ICD-10-CM

## 2024-01-14 LAB — POCT GLYCOSYLATED HEMOGLOBIN (HGB A1C): Hemoglobin A1C: 7 % — AB (ref 4.0–5.6)

## 2024-01-14 NOTE — Patient Instructions (Addendum)
 Please continue: - Farxiga  10 mg before b'fast - Trulicity  4.5 mg weekly - Lantus  80 units in the evening - Novolog  15 minutes before meals: 20 units in a.m. and 25 units before dinner  Try to stop eating/drinking at night, but if you have a snack or milk, you may need to cover it with Novolog  5-10 units - including the snack at 2-3 am (milk).  Try to start Fish oil 1000 mg daily.  Please return in 4 months.

## 2024-01-14 NOTE — Progress Notes (Signed)
 Patient ID: Daniel [name], male   DOB: [birth year], 68 y.o.   MRN: [id number]  HPI: Daniel [name] is a 68 y.o.-year-old male, returning for follow-up for DM2, dx in 2015, insulin -dependent since 2016, uncontrolled, with complications (PAD, foot ulcer, status post right transmetatarsal amputation, stage IIIa CKD, PN). Pt. previously saw Dr. Kassie, but last visit with me 4 months ago.  Interim history: He has increased urination, no blurry vision, chest pain.   He has a reversed circadian rhythm, staying up very late at night, and going to bed close to morning.  He does not have set mealtimes.  He still drinks several glasses of milk in the middle of the night.  Reviewed HbA1c: Lab Results  Component Value Date   HGBA1C 7.3 (A) 09/15/2023   HGBA1C 5.8 12/12/2022   HGBA1C 6.6 (A) 05/08/2022   HGBA1C 6.4 (A) 01/21/2022   HGBA1C 6.6 (A) 09/19/2021   HGBA1C 7.0 (A) 04/11/2021   HGBA1C 7.2 (A) 01/09/2021   HGBA1C 7.5 (A) 10/08/2020   HGBA1C 6.3 (A) 05/07/2020   HGBA1C 6.2 (A) 08/24/2019   Pt is on a regimen of: - Farxiga  10 mg before b'fast - Trulicity  4.5 mg weekly  - Lantus  100 >> 80 >> 100 >> 80 units  in a.m. >> moved at night  - Novolog  (15 minutes before meals) 20 units in a.m. and 30 units before dinner >> Humalog  20 units in a.m. and 25 units before dinner  He checks his blood sugars with his CGM:  Prev.:  Previously:  Lowest sugar was 55 >> 70 >> 50s >> 60s; he has hypoglycemia awareness <60.  Highest sugar was 400s >> ... 200s >> 300s.  He drinks 4-5 gallons of 2% milk a week >> trying to cut down - still 3-4 glasses a night.  He was previously drinking whole milk. He sees nutrition. He eats at night, eats oatmeal + brown sugar.  - + CKD -he sees nephrology (Dr. Prescilla), last BUN/creatinine:  Lab Results  Component Value Date   BUN 29 (H) 01/08/2024   BUN 33 (H) 12/23/2023   CREATININE 1.69 (H) 01/08/2024   CREATININE 1.72 (H) 12/23/2023   03/26/2023:  Microalbumin 1.2 Lab Results  Component Value Date   MICRALBCREAT 69 (H) 08/25/2019  He is not on an ACE inhibitor/ARB.  -+ HL; last set of lipids: Lab Results  Component Value Date   CHOL 125 11/26/2023   HDL 39 (L) 11/26/2023   LDLCALC  11/26/2023     Comment:     . LDL cholesterol not calculated. Triglyceride levels greater than 400 mg/dL invalidate calculated LDL results. . Reference range: <100 . Desirable range <100 mg/dL for primary prevention;   <70 mg/dL for patients with CHD or diabetic patients  with > or = 2 CHD risk factors. SABRA LDL-C is now calculated using the Martin-Hopkins  calculation, which is a validated novel method providing  better accuracy than the Friedewald equation in the  estimation of LDL-C.  Gladis APPLETHWAITE et al. SANDREA. 7986;689(80): 2061-2068  (http://education.[facility].com/faq/FAQ164)    TRIG 411 (H) 11/26/2023   CHOLHDL 3.2 11/26/2023  He is on Zetia  10 mg daily, Repatha .  He is intolerant to statins due to myalgias.   He does have a history of NASH.  - last eye exam was in 05/29/2022. No DR. Dr. Roz. Coming up.  - + numbness and tingling in his feet.  Last foot exam was here in clinic 09/15/2023.  He is on Lyrica  300  mg twice a day. Sees Dr. Roddie - podiatrist in [place].  He does not have a hypothyroidism - this is Rx'ed by his psychiatrist - Dr. Vincente. Pt was on levothyroxine  50 mcg daily >> now off.  Reviewed his latest TSH levels: Lab Results  Component Value Date   TSH 0.47 05/07/2020   TSH 0.108 (L) 09/14/2019   TSH 0.736 09/17/2016   TSH 0.19 (L) 07/11/2015   TSH 1.067 06/21/2013   TSH 2.762 01/01/2007   He also has a history of HTN, liver disease, OSA, HIV, ADHD, on chronic opioids.  ROS: + see HPI  Past Medical History:  Diagnosis Date   ADHD (attention deficit hyperactivity disorder)    Anxiety    Chronic kidney disease    Clotting disorder    Depression    Diabetes mellitus without complication ([name])     Diabetes mellitus, type II ([name])    Dizziness 03/17/2015   Essential hypertension 06/25/2018   GERD (gastroesophageal reflux disease)    HIV disease ([name])    HIV infection ([name])    Liver disease    OSA (obstructive sleep apnea) 07/25/2015   Uses CPAP regularly   Peripheral vascular disease    Ulcer    Past Surgical History:  Procedure Laterality Date   AMPUTATION Right 10/02/2017   Procedure: RIGHT TRANSMETATARSAL AMPUTATION;  Surgeon: Jerri Kay HERO, MD;  Location: [facility] OR;  Service: Orthopedics;  Laterality: Right;   SMALL INTESTINE SURGERY     STOMACH SURGERY     TOE AMPUTATION Right 08/2016   right great toe   Social History   Socioeconomic History   Marital status: Single    Spouse name: Not on file   Number of children: Not on file   Years of education: Not on file   Highest education level: 12th grade  Occupational History    Comment: DISABILITY  Tobacco Use   Smoking status: Former    Current packs/day: 0.00    Average packs/day: 0.1 packs/day for 10.0 years (1.0 ttl pk-yrs)    Types: Cigars, Cigarettes    Start date: 08/08/2004    Quit date: 08/09/2014    Years since quitting: 9.4   Smokeless tobacco: Never  Vaping Use   Vaping status: Never Used  Substance and Sexual Activity   Alcohol use: No    Alcohol/week: 0.0 standard drinks of alcohol   Drug use: No   Sexual activity: Not Currently    Partners: Male    Comment: pt. declined condoms  Other Topics Concern   Not on file  Social History Narrative   Epworth Sleepiness Scale = 7 (as of 03/16/2015)   Social Drivers of Health   Financial Resource Strain: Low Risk  (01/02/2024)   Overall Financial Resource Strain (CARDIA)    Difficulty of Paying Living Expenses: Not hard at all  Food Insecurity: No Food Insecurity (01/02/2024)   Hunger Vital Sign    Worried About Running Out of Food in the Last Year: Never true    Ran Out of Food in the Last Year: Never true  Transportation Needs: Unmet Transportation Needs  (01/02/2024)   PRAPARE - Administrator, Civil Service (Medical): Yes    Lack of Transportation (Non-Medical): No  Physical Activity: Inactive (01/02/2024)   Exercise Vital Sign    Days of Exercise per Week: 0 days    Minutes of Exercise per Session: Not on file  Stress: No Stress Concern Present (01/02/2024)   [employer] of Occupational  Health - Occupational Stress Questionnaire    Feeling of Stress: Only a little  Social Connections: Socially Isolated (01/02/2024)   Social Connection and Isolation Panel    Frequency of Communication with Friends and Family: Three times a week    Frequency of Social Gatherings with Friends and Family: Once a week    Attends Religious Services: Never    Database Administrator or Organizations: No    Attends Engineer, Structural: Not on file    Marital Status: Never married  Intimate Partner Violence: Not At Risk (10/01/2017)   Humiliation, Afraid, Rape, and Kick questionnaire    Fear of Current or Ex-Partner: No    Emotionally Abused: No    Physically Abused: No    Sexually Abused: No   Current Outpatient Medications on File Prior to Visit  Medication Sig Dispense Refill   abacavir -dolutegravir -lamiVUDine  (TRIUMEQ ) 600-50-300 MG tablet Take 1 tablet by mouth daily. 30 tablet 11   acetaminophen  (TYLENOL ) 500 MG tablet Take 1,500 mg by mouth in the morning and at bedtime.     ALPRAZolam  (XANAX ) 1 MG tablet Take 1 mg by mouth at bedtime. *May take one tablet up to 4 times daily as needed for anxiety     amphetamine -dextroamphetamine  (ADDERALL) 30 MG tablet Take 30 mg by mouth 2 (two) times daily.     Continuous Glucose Sensor (FREESTYLE LIBRE 2 SENSOR) MISC CHECK GLUCOSE CONTINUOUSLY CHANGE SENSOR EVERY 14 DAYS. 6 each 3   Dextromethorphan-buPROPion ER (AUVELITY) 45-105 MG TBCR Take 1 tablet by mouth daily at 12 noon.     diclofenac  Sodium (VOLTAREN ) 1 % GEL APPLY 2 GM TO EACH KNEE EVERY MORNING AND EVERY NIGHT AT BEDTIME, AND  APPLY 1 GM TO EACH KNEE EVERY AFTERNOON 300 g 2   divalproex  (DEPAKOTE  ER) 500 MG 24 hr tablet TAKE 1 TABLET(500 MG) BY MOUTH AT BEDTIME 90 tablet 1   Dulaglutide  (TRULICITY ) 4.5 MG/0.5ML SOAJ ADMINISTER 4.5 MG UNDER THE SKIN 1 TIME A WEEK 6 mL 3   Evolocumab  (REPATHA  SURECLICK) 140 MG/ML SOAJ INJECT UNDER THE SKIN EVERY 14 DAYS 6 mL 1   ezetimibe  (ZETIA ) 10 MG tablet TAKE 1 TABLET BY MOUTH DAILY 90 tablet 3   FARXIGA  10 MG TABS tablet TAKE 1 TABLET(10 MG) BY MOUTH DAILY BEFORE BREAKFAST 90 tablet 3   finasteride (PROSCAR) 5 MG tablet Take 5 mg by mouth daily.     furosemide  (LASIX ) 40 MG tablet TAKE 60MG  FOR 3 DAYS THEN BACK TO 40MG  DAILY; TAKE 1 TABLET(40 MG) BY MOUTH DAILY (Patient taking differently: Take 60 mg by mouth daily. TAKE 60MG  FOR 3 DAYS THEN BACK TO 40MG  DAILY; TAKE 1 TABLET(40 MG) BY MOUTH DAILY) 93 tablet 3   insulin  glargine (LANTUS  SOLOSTAR) 100 UNIT/ML Solostar Pen Inject 80 Units into the skin daily. 75 mL 3   insulin  lispro (HUMALOG  KWIKPEN) 100 UNIT/ML KwikPen INJECT 15 UNITS WITH BREAKFAST AND 25 UNITS WITH SUPPER 45 mL 2   Insulin  Pen Needle 32G X 5 MM MISC Use 3x a day 300 each 3   Lemborexant (DAYVIGO) 10 MG TABS Take 10 mg by mouth at bedtime.     levothyroxine  (SYNTHROID , LEVOTHROID) 50 MCG tablet Take 1 tablet (50 mcg total) by mouth at bedtime. 30 tablet 11   pantoprazole  (PROTONIX ) 40 MG tablet TAKE 1 TABLET(40 MG) BY MOUTH TWICE DAILY 180 tablet 1   pregabalin  (LYRICA ) 300 MG capsule TAKE 1 CAPSULE(300 MG) BY MOUTH TWICE DAILY 60 capsule 0  rivaroxaban  (XARELTO ) 20 MG TABS tablet Take 1 tablet (20 mg total) by mouth daily with supper. 90 tablet 2   silodosin (RAPAFLO) 8 MG CAPS capsule Take 8 mg by mouth daily.     TRINTELLIX  5 MG TABS tablet Take 5 mg by mouth daily. (Patient taking differently: Take 10 mg by mouth daily.)     No current facility-administered medications on file prior to visit.   Allergies  Allergen Reactions   Aspirin Swelling    Efavirenz  Swelling and Rash    Other reaction(s): anaphylaxis   Ibuprofen Swelling   Milk-Related Compounds     Other Reaction(s): unpleasant taste   Nsaids Other (See Comments)    unknwn   Pravastatin     myalgias   Lipitor [Atorvastatin  Calcium ] Other (See Comments)    Leg pain   Family History  Problem Relation Age of Onset   Depression Brother    Throat cancer Brother        half brother, never smoker   COPD Mother    Diabetes Neg Hx    PE: There were no vitals taken for this visit. Wt Readings from Last 3 Encounters:  01/08/24 259 lb 6.4 oz (117.7 kg)  12/23/23 252 lb 3.2 oz (114.4 kg)  11/26/23 255 lb 3.2 oz (115.8 kg)   Constitutional: overweight, in NAD Eyes:  EOMI, no exophthalmos ENT: no neck masses, no cervical lymphadenopathy Cardiovascular: RRR, No MRG Respiratory: CTA B Musculoskeletal: + deformities - R TMA Skin:no rashes, + stasis dermatitis, no open wounds Neurological: + Resting tremor  ASSESSMENT: 1. DM2, insulin -dependent, uncontrolled, with complications - PAD - foot ulcer, status post right transmetatarsal amputation - stage IIIa CKD - PN  2.  Hyperlipidemia  3.  Acquired hypothyroidism  PLAN:  1. Patient with longstanding type 2 diabetes, on oral antidiabetic regimen, weekly GLP-1 receptor agonist and basal/bolus insulin , with still suboptimal control.  At last visit, sugars were increasing during the night and we discussed about moving his Lantus  in the evening.  Also, I advised him to stop snacking in the middle of the night but if he had to snack, to use several units of insulin  before this.  He did not start this. CGM interpretation: -At today's visit, we reviewed his CGM downloads: It appears that 60% of values are in target range (goal >70%), while 40% are higher than 180 (goal <25%), and 0% are lower than 70 (goal <4%).  The calculated average blood sugar is 175.  The projected HbA1c for the next 3 months (GMI) is 7.5%. -Reviewing the  CGM trends, sugars appear to not have changed much since last visit.  They are fluctuating mainly within the target range during the day, but increasing after dinner.  Upon questioning, he is not taking the NovoLog  15 minutes before meals, but only right before he starts eating.  I advised him to try his best to inject 50 minutes before dinner.  He tells me that he is taking Lantus  at different times of the day or night and I advised him to take it only in the evening.  Moreover, he is drinking several glasses of milk around 2-3 AM, and he is not covering them with insulin .  I again advised him to stop this, but if he cannot, to at least go to several units of insulin  before the drinking the meal. -I advised him to: Patient Instructions  Please continue: - Farxiga  10 mg before b'fast - Trulicity  4.5 mg weekly - Lantus  80 units  in the evening - Novolog  15 minutes before meals: 15 units in a.m. and 25 units before dinner  Try to stop eating at night, but if you have a snack, you may need to cover it with Novolog  5-8 units - including the snack at 2-3 am.  Please return in 4 months.  - we checked his HbA1c: 7.0% (lower) - advised to check sugars at different times of the day - 4x a day, rotating check times - advised for yearly eye exams >> he is not UTD but has an exam coming up - return to clinic in 4 months  2. HL and 4.  Drug (statin)-induced myopathy - Latest lipid panel from last month showed elevated triglycerides and a slightly low HDL: Lab Results  Component Value Date   CHOL 125 11/26/2023   HDL 39 (L) 11/26/2023   LDLCALC  11/26/2023     Comment:     . LDL cholesterol not calculated. Triglyceride levels greater than 400 mg/dL invalidate calculated LDL results. . Reference range: <100 . Desirable range <100 mg/dL for primary prevention;   <70 mg/dL for patients with CHD or diabetic patients  with > or = 2 CHD risk factors. SABRA LDL-C is now calculated using the Martin-Hopkins   calculation, which is a validated novel method providing  better accuracy than the Friedewald equation in the  estimation of LDL-C.  Gladis APPLETHWAITE et al. SANDREA. 7986;689(80): 2061-2068  (http://education.[facility].com/faq/FAQ164)    TRIG 411 (H) 11/26/2023   CHOLHDL 3.2 11/26/2023  - He continues on Zetia  10 mg daily and Repatha , without side effects.  He could not tolerate statins due to myalgias.  We did discuss about trying fish oil -he was on this in the past and agrees to try it again.  3.  Thyroid  hormone use -He was started on thyroid  hormones by his psychiatrist, without a diagnosis of hypothyroidism, apparently for symptom control -Latest TSH available for review was normal: Lab Results  Component Value Date   TSH 0.47 05/07/2020  -He was on levothyroxine  50 mcg daily >> now off - Management per psychiatry  Lela Fendt, MD PhD [facility]

## 2024-01-19 DIAGNOSIS — R42 Dizziness and giddiness: Secondary | ICD-10-CM | POA: Diagnosis not present

## 2024-02-04 ENCOUNTER — Other Ambulatory Visit: Payer: Self-pay | Admitting: Family Medicine

## 2024-02-04 ENCOUNTER — Ambulatory Visit: Admitting: Gastroenterology

## 2024-02-04 ENCOUNTER — Telehealth: Payer: Self-pay | Admitting: Family Medicine

## 2024-02-04 DIAGNOSIS — M792 Neuralgia and neuritis, unspecified: Secondary | ICD-10-CM

## 2024-02-04 DIAGNOSIS — E1142 Type 2 diabetes mellitus with diabetic polyneuropathy: Secondary | ICD-10-CM

## 2024-02-04 MED ORDER — PREGABALIN 300 MG PO CAPS: ORAL_CAPSULE | ORAL | 0 refills | Status: DC

## 2024-02-04 MED ORDER — FUROSEMIDE 40 MG PO TABS: ORAL_TABLET | ORAL | 3 refills | Status: AC

## 2024-02-04 NOTE — Progress Notes (Deleted)
 Daniel [name] [facility] [id number] 07/09/1955   Chief Complaint:  Referring Provider: Levora Reyes SAUNDERS, MD Primary GI MD: Daniel [name]  HPI: Daniel [name] is a 68 y.o. male with past medical history of ADHD, anxiety/depression, CKD, diabetes, HTN, GERD, HIV, OSA, hepatic steatosis who presents today for a complaint of *** .    Previously seen in [facility].  Looks like last visit was 02/2021.  ENT visit 12/28/2023 for vertigo and dysphagia.  Endorsed 8 months of gradually worsening dysphagia with pills and solid foods.  Noted to have reflux, taking Protonix  and Zantac.  Laryngoscopy showed mild post glottic edema, otherwise normal exam.  Barium swallow study was recommended for further evaluation.  Barium swallow done 01/14/2024 showed smooth narrowing/smooth stricturing of the gastroesophageal junction.  Barium tablet passed without significant delay.  Recommendation was to consider endoscopy for further characterization.  Patient is on Xarelto .  Last visit with cardiology 04/21/2023.  Noted to have history of HIV, recurrent DVT/PE on lifelong anticoagulation.  Chronic dyspnea on exertion which was stable with unremarkable stress testing and echocardiogram previously.  Compliant with Xarelto .  Furosemide  was increased, fluid restriction recommended, low-sodium diet, etc.  Advised to follow-up in 9 to 12 months.   Discussed the use of AI scribe software for clinical note transcription with the patient, who gave verbal consent to proceed.  History of Present Illness       Previous GI Procedures/Imaging   Barium swallow 01/14/2024 IMPRESSION: 1. Smooth narrowing/smooth stricturing the gastroesophageal junction. However, the 13 mm barium tablet passed through this region without significant delay. Consider endoscopy for further characterization.  RUQ US  03/25/2021 IMPRESSION: Increased echogenicity throughout the liver consistent with fatty infiltration. No focal mass is noted.  Past  Medical History:  Diagnosis Date   ADHD (attention deficit hyperactivity disorder)    Anxiety    Chronic kidney disease    Clotting disorder    Depression    Diabetes mellitus without complication ([name])    Diabetes mellitus, type II ([name])    Dizziness 03/17/2015   Essential hypertension 06/25/2018   GERD (gastroesophageal reflux disease)    HIV disease ([name])    HIV infection ([name])    Liver disease    OSA (obstructive sleep apnea) 07/25/2015   Uses CPAP regularly   Peripheral vascular disease    Ulcer     Past Surgical History:  Procedure Laterality Date   AMPUTATION Right 10/02/2017   Procedure: RIGHT TRANSMETATARSAL AMPUTATION;  Surgeon: Daniel Kay HERO, MD;  Location: [facility] OR;  Service: Orthopedics;  Laterality: Right;   SMALL INTESTINE SURGERY     STOMACH SURGERY     TOE AMPUTATION Right 08/2016   right great toe    Current Outpatient Medications  Medication Sig Dispense Refill   abacavir -dolutegravir -lamiVUDine  (TRIUMEQ ) 600-50-300 MG tablet Take 1 tablet by mouth daily. 30 tablet 11   acetaminophen  (TYLENOL ) 500 MG tablet Take 1,500 mg by mouth in the morning and at bedtime.     ALPRAZolam  (XANAX ) 1 MG tablet Take 1 mg by mouth at bedtime. *May take one tablet up to 4 times daily as needed for anxiety     amphetamine -dextroamphetamine  (ADDERALL) 30 MG tablet Take 30 mg by mouth 2 (two) times daily.     Continuous Glucose Sensor (FREESTYLE LIBRE 2 SENSOR) MISC CHECK GLUCOSE CONTINUOUSLY CHANGE SENSOR EVERY 14 DAYS. 6 each 3   Dextromethorphan-buPROPion ER (AUVELITY) 45-105 MG TBCR Take 1 tablet by mouth daily at 12 noon.     diclofenac  Sodium (  VOLTAREN ) 1 % GEL APPLY 2 GM TO EACH KNEE EVERY MORNING AND EVERY NIGHT AT BEDTIME, AND APPLY 1 GM TO EACH KNEE EVERY AFTERNOON 300 g 2   divalproex  (DEPAKOTE  ER) 500 MG 24 hr tablet TAKE 1 TABLET(500 MG) BY MOUTH AT BEDTIME 90 tablet 1   Dulaglutide  (TRULICITY ) 4.5 MG/0.5ML SOAJ ADMINISTER 4.5 MG UNDER THE SKIN 1 TIME A WEEK 6 mL 3    Evolocumab  (REPATHA  SURECLICK) 140 MG/ML SOAJ INJECT UNDER THE SKIN EVERY 14 DAYS 6 mL 1   ezetimibe  (ZETIA ) 10 MG tablet TAKE 1 TABLET BY MOUTH DAILY 90 tablet 3   FARXIGA  10 MG TABS tablet TAKE 1 TABLET(10 MG) BY MOUTH DAILY BEFORE BREAKFAST 90 tablet 3   finasteride (PROSCAR) 5 MG tablet Take 5 mg by mouth daily.     furosemide  (LASIX ) 40 MG tablet TAKE 60MG  FOR 3 DAYS THEN BACK TO 40MG  DAILY; TAKE 1 TABLET(40 MG) BY MOUTH DAILY (Patient taking differently: Take 60 mg by mouth daily. TAKE 60MG  FOR 3 DAYS THEN BACK TO 40MG  DAILY; TAKE 1 TABLET(40 MG) BY MOUTH DAILY) 93 tablet 3   insulin  glargine (LANTUS  SOLOSTAR) 100 UNIT/ML Solostar Pen Inject 80 Units into the skin daily. 75 mL 3   insulin  lispro (HUMALOG  KWIKPEN) 100 UNIT/ML KwikPen INJECT 15 UNITS WITH BREAKFAST AND 25 UNITS WITH SUPPER 45 mL 2   Insulin  Pen Needle 32G X 5 MM MISC Use 3x a day 300 each 3   Lemborexant (DAYVIGO) 10 MG TABS Take 10 mg by mouth at bedtime.     levothyroxine  (SYNTHROID , LEVOTHROID) 50 MCG tablet Take 1 tablet (50 mcg total) by mouth at bedtime. 30 tablet 11   pantoprazole  (PROTONIX ) 40 MG tablet TAKE 1 TABLET(40 MG) BY MOUTH TWICE DAILY 180 tablet 1   pregabalin  (LYRICA ) 300 MG capsule TAKE 1 CAPSULE(300 MG) BY MOUTH TWICE DAILY 60 capsule 0   rivaroxaban  (XARELTO ) 20 MG TABS tablet Take 1 tablet (20 mg total) by mouth daily with supper. 90 tablet 2   silodosin (RAPAFLO) 8 MG CAPS capsule Take 8 mg by mouth daily.     TRINTELLIX  5 MG TABS tablet Take 5 mg by mouth daily. (Patient taking differently: Take 10 mg by mouth daily.)     No current facility-administered medications for this visit.    Allergies as of 02/04/2024 - Review Complete 01/14/2024  Allergen Reaction Noted   Aspirin Swelling 06/20/2015   Efavirenz  Swelling and Rash 06/04/2006   Ibuprofen Swelling 06/28/2015   Milk-related compounds  12/23/2023   Nsaids Other (See Comments) 05/21/2015   Pravastatin  04/25/2021   Lipitor [atorvastatin   calcium ] Other (See Comments) 08/25/2018    Family History  Problem Relation Age of Onset   Depression Brother    Throat cancer Brother        half brother, never smoker   COPD Mother    Diabetes Neg Hx     Social History[1]   Review of Systems:    Constitutional: No weight loss, fever, chills, weakness or fatigue Eyes: No change in vision Ears, Nose, Throat:  No change in hearing or congestion Skin: No rash or itching Cardiovascular: No chest pain, chest pressure or palpitations   Respiratory: No SOB or cough Gastrointestinal: See HPI and otherwise negative Genitourinary: No dysuria or change in urinary frequency Neurological: No headache, dizziness or syncope Musculoskeletal: No new muscle or joint pain Hematologic: No bleeding or bruising    Physical Exam:  Vital signs: There were no vitals  taken for this visit.  Constitutional: NAD, Well developed, Well nourished, alert and cooperative Head:  Normocephalic and atraumatic.  Eyes: No scleral icterus. Conjunctiva pink. Mouth: No oral lesions. Respiratory: Respirations even and unlabored. Lungs clear to auscultation bilaterally.  No wheezes, crackles, or rhonchi.  Cardiovascular:  Regular rate and rhythm. No murmurs. No peripheral edema. Gastrointestinal:  Soft, nondistended, nontender. No rebound or guarding. Normal bowel sounds. No appreciable masses or hepatomegaly. Rectal:  Not performed.  Neurologic:  Alert and oriented x4;  grossly normal neurologically.  Skin:   Dry and intact without significant lesions or rashes. Psychiatric: Oriented to person, place and time. Demonstrates good judgement and reason without abnormal affect or behaviors.   RELEVANT LABS AND IMAGING: CBC    Component Value Date/Time   WBC 7.1 01/08/2024 1033   RBC 5.14 01/08/2024 1033   HGB 17.4 (H) 01/08/2024 1033   HGB 17.3 11/17/2018 1630   HGB 17.1 05/19/2011 1320   HCT 49.5 01/08/2024 1033   HCT 50.0 11/17/2018 1630   HCT 49.1  05/19/2011 1320   PLT 133.0 (L) 01/08/2024 1033   PLT 149 (L) 11/17/2018 1630   MCV 96.4 01/08/2024 1033   MCV 93 11/17/2018 1630   MCV 94.7 05/19/2011 1320   MCH 33.3 (H) 04/14/2022 1529   MCHC 35.1 01/08/2024 1033   RDW 14.3 01/08/2024 1033   RDW 14.2 11/17/2018 1630   RDW 13.4 05/19/2011 1320   LYMPHSABS 2,919 04/14/2022 1529   LYMPHSABS 2.0 05/19/2011 1320   MONOABS 0.7 09/14/2019 1554   MONOABS 0.7 05/19/2011 1320   EOSABS 182 04/14/2022 1529   EOSABS 0.2 05/19/2011 1320   BASOSABS 28 04/14/2022 1529   BASOSABS 0.0 05/19/2011 1320    CMP     Component Value Date/Time   NA 138 01/08/2024 1033   NA 144 10/08/2021 1443   K 4.0 01/08/2024 1033   CL 99 01/08/2024 1033   CO2 31 01/08/2024 1033   GLUCOSE 170 (H) 01/08/2024 1033   BUN 29 (H) 01/08/2024 1033   BUN 21 10/08/2021 1443   CREATININE 1.69 (H) 01/08/2024 1033   CREATININE 1.57 (H) 11/26/2023 1508   CALCIUM  9.5 01/08/2024 1033   PROT 7.1 11/26/2023 1508   PROT 7.6 10/08/2021 1443   ALBUMIN 4.5 03/26/2023 1639   ALBUMIN 4.7 10/08/2021 1443   AST 22 11/26/2023 1508   ALT 34 11/26/2023 1508   ALKPHOS 93 03/26/2023 1639   BILITOT 1.0 11/26/2023 1508   BILITOT 0.8 10/08/2021 1443   GFRNONAA 46 (L) 01/04/2020 1546   GFRNONAA 41 (L) 09/13/2019 1714   GFRAA 53 (L) 01/04/2020 1546   GFRAA 48 (L) 09/13/2019 1714   Echocardiogram 01/31/2020 1. Left ventricular ejection fraction, by estimation, is 60 to 65% . The left ventricle has normal function. The left ventricle has no regional wall motion abnormalities. There is mild left ventricular hypertrophy. Left ventricular diastolic parameters are consistent with Grade I diastolic dysfunction ( impaired relaxation) .  2. Right ventricular systolic function is normal. The right ventricular size is normal.  3. The mitral valve is grossly normal. No evidence of mitral valve regurgitation.  4. The aortic valve was not well visualized. Aortic valve regurgitation is not  visualized.  5. Aortic dilatation noted. There is borderline dilatation of the aortic root, measuring 39 mm.  6. The inferior vena cava is normal in size with greater than 50% respiratory variability, suggesting right atrial pressure of 3 mmHg.  Assessment/Plan:   Assessment & Plan  Request past GI records Schedule EGD   Camie Furbish, PA-C [facility] 02/04/2024, 12:05 PM  Patient Care Team: Daniel Reyes SAUNDERS, MD as PCP - General (Family Medicine) Comer, Lamar ORN, MD (Inactive) as PCP - Infectious Diseases (Infectious Diseases) Raford Riggs, MD as PCP - Cardiology (Cardiology) Celestia Agent, MD (Inactive) as PCP - Gastroenterology (Gastroenterology) Burt Fus, DPM as Consulting Physician (Podiatry) Kassie Mallick, MD (Inactive) as Consulting Physician (Endocrinology) Jeffie Haws, OD (Optometry) Court Dorn PARAS, MD as Consulting Physician (Cardiology) Vincente Grip, MD as Consulting Physician (Psychiatry) Prescilla Beams, MD as Consulting Physician (Nephrology) Beverley Evalene BIRCH, MD as Attending Physician (Orthopedic Surgery) Onita Duos, MD as Consulting Physician (Neurology) Dohmeier, Dedra, MD as Consulting Physician (Neurology) Roz Anes, MD as Consulting Physician (Ophthalmology) Nicholaus, Sherlean CROME, [facility] (Inactive) (Pharmacist)      [1]  Social History Tobacco Use   Smoking status: Former    Current packs/day: 0.00    Average packs/day: 0.1 packs/day for 10.0 years (1.0 ttl pk-yrs)    Types: Cigars, Cigarettes    Start date: 08/08/2004    Quit date: 08/09/2014    Years since quitting: 9.4   Smokeless tobacco: Never  Vaping Use   Vaping status: Never Used  Substance Use Topics   Alcohol use: No    Alcohol/week: 0.0 standard drinks of alcohol   Drug use: No

## 2024-02-04 NOTE — Telephone Encounter (Unsigned)
 Copied from CRM #[id number]. Topic: Clinical - Refused Triage >> Feb 04, 2024  3:33 PM Carlyon D wrote: Patient/caller voiced complaints of Swollen legs . Declined transfer to triage. States this has been on going issue for 5-8 months

## 2024-02-04 NOTE — Telephone Encounter (Signed)
 Requested Prescriptions   Pending Prescriptions Disp Refills   furosemide  (LASIX ) 40 MG tablet 93 tablet 3    Sig: TAKE 60MG  FOR 3 DAYS THEN BACK TO 40MG  DAILY; TAKE 1 TABLET(40 MG) BY MOUTH DAILY   pregabalin  (LYRICA ) 300 MG capsule 60 capsule 0    Sig: TAKE 1 CAPSULE(300 MG) BY MOUTH TWICE DAILY     Date of patient request: 02/04/2024 Last office visit: 02/04/2024 Upcoming visit: 07/07/2024 Date of last refill: 04/21/2023 Last refill amount: 90, 30

## 2024-02-04 NOTE — Telephone Encounter (Signed)
 Copied from CRM #[id number]. Topic: Clinical - Medication Refill >> Feb 04, 2024  3:34 PM Paige D wrote: Medication: furosemide  (LASIX ) 40 MG tablet  Has the patient contacted their pharmacy? No (Agent: If no, request that the patient contact the pharmacy for the refill. If patient does not wish to contact the pharmacy document the reason why and proceed with request.) (Agent: If yes, when and what did the pharmacy advise?)  This is the patient's preferred pharmacy:  [facility] [phone] @ [address] [place], [place] - [address] [place] [place] [address] Phone: [phone] Fax: [phone]    Is this the correct pharmacy for this prescription? Yes If no, delete pharmacy and type the correct one.   Has the prescription been filled recently? No  Is the patient out of the medication? Yes  Has the patient been seen for an appointment in the last year OR does the patient have an upcoming appointment? Yes  Can we respond through MyChart? Yes  Agent: Please be advised that Rx refills may take up to 3 business days. We ask that you follow-up with your pharmacy.

## 2024-02-04 NOTE — Telephone Encounter (Signed)
 Controlled substance database reviewed.  Pregabalin  300 mg #60 last filled on 12/31/2023.  Office visit November 14.  Refills granted.

## 2024-03-16 ENCOUNTER — Other Ambulatory Visit

## 2024-03-16 ENCOUNTER — Ambulatory Visit: Admitting: Gastroenterology

## 2024-03-16 ENCOUNTER — Telehealth: Payer: Self-pay | Admitting: Emergency Medicine

## 2024-03-16 ENCOUNTER — Encounter: Payer: Self-pay | Admitting: Gastroenterology

## 2024-03-16 VITALS — BP 120/80 | Ht 70.0 in | Wt 257.0 lb

## 2024-03-16 DIAGNOSIS — R933 Abnormal findings on diagnostic imaging of other parts of digestive tract: Secondary | ICD-10-CM

## 2024-03-16 DIAGNOSIS — R110 Nausea: Secondary | ICD-10-CM | POA: Diagnosis not present

## 2024-03-16 DIAGNOSIS — K5900 Constipation, unspecified: Secondary | ICD-10-CM

## 2024-03-16 DIAGNOSIS — Z21 Asymptomatic human immunodeficiency virus [HIV] infection status: Secondary | ICD-10-CM

## 2024-03-16 DIAGNOSIS — K625 Hemorrhage of anus and rectum: Secondary | ICD-10-CM

## 2024-03-16 DIAGNOSIS — K760 Fatty (change of) liver, not elsewhere classified: Secondary | ICD-10-CM

## 2024-03-16 DIAGNOSIS — K219 Gastro-esophageal reflux disease without esophagitis: Secondary | ICD-10-CM | POA: Diagnosis not present

## 2024-03-16 DIAGNOSIS — R1310 Dysphagia, unspecified: Secondary | ICD-10-CM | POA: Diagnosis not present

## 2024-03-16 DIAGNOSIS — K5909 Other constipation: Secondary | ICD-10-CM | POA: Diagnosis not present

## 2024-03-16 LAB — TSH: TSH: 1.62 u[IU]/mL (ref 0.35–5.50)

## 2024-03-16 NOTE — Telephone Encounter (Signed)
 Please advise holding Xarelto  prior to EGD 04/07/24 Last labs 12/2023.  Thank you!  AW

## 2024-03-16 NOTE — Progress Notes (Signed)
 Daniel [name] [id number] [birth year]   Chief Complaint: Trouble swallowing pills  Referring Provider: Levora Reyes SAUNDERS, MD Primary GI MD: Sampson (previously seen at [facility])  HPI: Daniel [name] is a 69 y.o. male with past medical history of ADHD, anxiety/depression, CKD, T2DM, HTN, GERD, HIV, liver disease, OSA who presents today for a complaint of dysphagia.    Patient is referred by [facility] for consideration of an upper endoscopy.  Had a barium swallow study in November that showed some narrowing at the GE junction, however the barium pill did pass without any delay. Per PCP note 01/08/2024, patient has been having breakthrough heartburn despite use of Protonix , and dysphagia.  He is on Xarelto  for history of DVT.  Has history of HIV which is followed by infectious disease, patient has good adherence to medication.   Discussed the use of AI scribe software for clinical note transcription with the patient, who gave verbal consent to proceed.  History of Present Illness Daniel [name] is a 69 year old male with prior antireflux surgery who presents for evaluation of dysphagia to pills and persistent acid reflux symptoms.  Dysphagia to Pills: - Difficulty swallowing pills for approximately 18 months - Sensation of pills hanging at the mid esophagus, especially at night when lying down - Pills eventually pass by morning; during the day, requires large amounts of liquid to facilitate swallowing - No difficulty swallowing food or liquids - No regurgitation of pills - Mild throat discomfort only when pills are stuck - Barium swallow study showed narrowing at the lower esophagus; pill passed through - No recent upper endoscopy  Gastroesophageal Reflux Symptoms: - Persistent acid reflux and heartburn requiring 6-12 Rolaids daily for symptom relief - Frequent breakthrough symptoms despite pantoprazole  twice daily - History of antireflux surgery 30-40 years ago; effective  for 20-25 years, with recurrence of reflux symptoms over the past 1-2 years - Occasional nausea without vomiting - Abdominal pain only with acid reflux, resolves with antacids - Appetite and dietary intake remain stable  Constipation and Rectal Bleeding: - Chronic constipation for at least two years - Bowel movements typically once daily, sometimes twice; stools are small and hard - Previously had bowel movements every 2-3 days - Stool softeners tried for one month without benefit; Miralax  not tolerated - Occasional loose stools attributed to dietary factors - Intermittent bright red blood on stool or toilet paper, has noticed intermittently for years - No dark rectal bleeding or Hibner stools - No use of fiber supplements - Takes at least 20 pills daily; suspects medications contribute to constipation - Occasionally skips morning pills due to pill burden - Missed two doses of Triumeq  for HIV in the past month due to pharmacy delay  Pulmonary and Cardiovascular Symptoms: - History of DVTs and pulmonary embolism, last episode years ago - Currently on Xarelto  - No history of heart attack or stroke - Regular shortness of breath without new chest pain or worsening dyspnea - Cardiac workup performed years ago; follows with cardiologist  Hepatic and Colorectal Surveillance: - Fatty liver disease monitored by primary care provider and previously by gastroenterologist - Most recent virtual colonoscopy 2-3 years ago, reportedly normal - No recent upper endoscopy  Thyroid  Function: - Levothyroxine  previously prescribed  - Has not refilled levothyroxine  recently - Upcoming appointment scheduled to discuss thyroid  management  Previous GI Procedures/Imaging   Barium swallow 01/14/2024 IMPRESSION: 1. Smooth narrowing/smooth stricturing the gastroesophageal junction. However, the 13 mm barium tablet passed through  this region without significant delay. Consider endoscopy for  further characterization.  RUQ US  03/25/2021 IMPRESSION: Increased echogenicity throughout the liver consistent with fatty infiltration. No focal mass is noted.  Past Medical History:  Diagnosis Date   ADHD (attention deficit hyperactivity disorder)    Anxiety    Chronic kidney disease    Clotting disorder    Depression    Diabetes mellitus without complication ([name])    Diabetes mellitus, type II ([name])    Dizziness 03/17/2015   Essential hypertension 06/25/2018   GERD (gastroesophageal reflux disease)    HIV disease ([name])    HIV infection ([name])    Liver disease    OSA (obstructive sleep apnea) 07/25/2015   Uses CPAP regularly   Peripheral vascular disease    Ulcer     Past Surgical History:  Procedure Laterality Date   AMPUTATION Right 10/02/2017   Procedure: RIGHT TRANSMETATARSAL AMPUTATION;  Surgeon: Jerri Kay HERO, MD;  Location: [facility] OR;  Service: Orthopedics;  Laterality: Right;   COLONOSCOPY     Vitural Colonoscopy at [facility]   SMALL INTESTINE SURGERY     STOMACH SURGERY     TOE AMPUTATION Right 08/2016   right great toe    Current Outpatient Medications  Medication Sig Dispense Refill   abacavir -dolutegravir -lamiVUDine  (TRIUMEQ ) 600-50-300 MG tablet Take 1 tablet by mouth daily. 30 tablet 11   acetaminophen  (TYLENOL ) 500 MG tablet Take 1,500 mg by mouth in the morning and at bedtime.     ALPRAZolam  (XANAX ) 1 MG tablet Take 1 mg by mouth at bedtime. *May take one tablet up to 4 times daily as needed for anxiety     amphetamine -dextroamphetamine  (ADDERALL) 30 MG tablet Take 30 mg by mouth 2 (two) times daily.     Continuous Glucose Sensor (FREESTYLE LIBRE 2 SENSOR) MISC CHECK GLUCOSE CONTINUOUSLY CHANGE SENSOR EVERY 14 DAYS. 6 each 3   Dextromethorphan-buPROPion ER (AUVELITY) 45-105 MG TBCR Take 1 tablet by mouth daily at 12 noon.     diclofenac  Sodium (VOLTAREN ) 1 % GEL APPLY 2 GM TO EACH KNEE EVERY MORNING AND EVERY NIGHT AT BEDTIME, AND APPLY 1 GM TO EACH  KNEE EVERY AFTERNOON 300 g 2   divalproex  (DEPAKOTE  ER) 500 MG 24 hr tablet TAKE 1 TABLET(500 MG) BY MOUTH AT BEDTIME 90 tablet 1   Dulaglutide  (TRULICITY ) 4.5 MG/0.5ML SOAJ ADMINISTER 4.5 MG UNDER THE SKIN 1 TIME A WEEK 6 mL 3   Evolocumab  (REPATHA  SURECLICK) 140 MG/ML SOAJ INJECT UNDER THE SKIN EVERY 14 DAYS 6 mL 1   ezetimibe  (ZETIA ) 10 MG tablet TAKE 1 TABLET BY MOUTH DAILY 90 tablet 3   FARXIGA  10 MG TABS tablet TAKE 1 TABLET(10 MG) BY MOUTH DAILY BEFORE BREAKFAST 90 tablet 3   finasteride (PROSCAR) 5 MG tablet Take 5 mg by mouth daily.     furosemide  (LASIX ) 40 MG tablet TAKE 60MG  FOR 3 DAYS THEN BACK TO 40MG  DAILY; TAKE 1 TABLET(40 MG) BY MOUTH DAILY 93 tablet 3   insulin  glargine (LANTUS  SOLOSTAR) 100 UNIT/ML Solostar Pen Inject 80 Units into the skin daily. 75 mL 3   insulin  lispro (HUMALOG  KWIKPEN) 100 UNIT/ML KwikPen INJECT 15 UNITS WITH BREAKFAST AND 25 UNITS WITH SUPPER 45 mL 2   Insulin  Pen Needle 32G X 5 MM MISC Use 3x a day 300 each 3   Lemborexant (DAYVIGO) 10 MG TABS Take 10 mg by mouth at bedtime.     pantoprazole  (PROTONIX ) 40 MG tablet TAKE 1 TABLET(40 MG) BY  MOUTH TWICE DAILY 180 tablet 1   pregabalin  (LYRICA ) 300 MG capsule TAKE 1 CAPSULE(300 MG) BY MOUTH TWICE DAILY 60 capsule 0   rivaroxaban  (XARELTO ) 20 MG TABS tablet Take 1 tablet (20 mg total) by mouth daily with supper. 90 tablet 2   silodosin (RAPAFLO) 8 MG CAPS capsule Take 8 mg by mouth daily.     TRINTELLIX  5 MG TABS tablet Take 5 mg by mouth daily. (Patient taking differently: Take 10 mg by mouth daily.)     No current facility-administered medications for this visit.    Allergies as of 03/16/2024 - Review Complete 03/16/2024  Allergen Reaction Noted   Aspirin Swelling 06/20/2015   Efavirenz  Swelling and Rash 06/04/2006   Ibuprofen Swelling 06/28/2015   Milk-related compounds  12/23/2023   Nsaids Other (See Comments) 05/21/2015   Pravastatin  04/25/2021   Lipitor [atorvastatin  calcium ] Other (See  Comments) 08/25/2018    Family History  Problem Relation Age of Onset   Depression Brother    Throat cancer Brother        half brother, never smoker   COPD Mother    Diabetes Neg Hx     Social History[1]   Review of Systems:    Constitutional: No weight loss, fever, chills Cardiovascular: No chest pain Respiratory: No SOB Gastrointestinal: See HPI and otherwise negative   Physical Exam:  Vital signs: BP 120/80   Ht 5' 10 (1.778 m)   Wt 257 lb (116.6 kg)   BMI 36.88 kg/m   Wt Readings from Last 3 Encounters:  03/16/24 257 lb (116.6 kg)  01/14/24 259 lb (117.5 kg)  01/08/24 259 lb 6.4 oz (117.7 kg)     Constitutional: Pleasant, obese male in NAD, alert and cooperative Head:  Normocephalic and atraumatic.  Mouth: Poor dentition Respiratory: Respirations even and unlabored. Lungs clear to auscultation bilaterally.  No wheezes, crackles, or rhonchi.  Cardiovascular:  Regular rate and rhythm. No murmurs. No peripheral edema. Gastrointestinal:  Soft, protuberant, nontender. No rebound or guarding. Normal bowel sounds. No appreciable masses or hepatomegaly. Rectal:  Not performed.  Neurologic:  Alert and oriented x4;  grossly normal neurologically.  Skin:   Dry and intact without significant lesions or rashes. Psychiatric: Oriented to person, place and time. Demonstrates good judgement and reason without abnormal affect or behaviors.   Echocardiogram 01/31/2020 1. Left ventricular ejection fraction, by estimation, is 60 to 65% . The left ventricle has normal function. The left ventricle has no regional wall motion abnormalities. There is mild left ventricular hypertrophy. Left ventricular diastolic parameters are consistent with Grade I diastolic dysfunction ( impaired relaxation) .  2. Right ventricular systolic function is normal. The right ventricular size is normal.  3. The mitral valve is grossly normal. No evidence of mitral valve regurgitation.  4. The aortic valve  was not well visualized. Aortic valve regurgitation is not visualized.  5. Aortic dilatation noted. There is borderline dilatation of the aortic root, measuring 39 mm.  6. The inferior vena cava is normal in size with greater than 50% respiratory variability, suggesting right atrial pressure of 3 mmHg.   Assessment/Plan:   Assessment & Plan Dysphagia  GERD Abnormal finding on GI tract imaging Pill dysphagia persists with lower esophageal narrowing noted on barium swallow.  Symptoms ongoing for about 18 months without progression.  Referred by ENT for consideration of upper endoscopy.   Patient with history of GERD, reports having what sounds like a fundoplication many years ago which previously controlled symptoms, but  in the last 1 to 2 years has had recurrence of reflux symptoms which are poorly controlled despite use of Protonix  40 mg twice daily.  Takes Rolaids for breakthrough symptoms as needed with relief.  No EGD in many years.  - Schedule EGD with possible dilation. I thoroughly discussed the procedure with the patient to include nature of the procedure, alternatives, benefits, and risks (including but not limited to bleeding, infection, perforation, anesthesia/cardiac/pulmonary complications). Patient verbalized understanding and gave verbal consent to proceed with procedure.  - Request Xarelto  hold - Continue PPI, consider medication change following endoscopic evaluation - Request past GI records  Chronic constipation [facility] Patient with about 2 years of chronic constipation.  Intermittently sees [facility] and states he has seen this for years.  No anemia.   Reports previously normal virtual colonoscopy within the last few years.  Polypharmacy and possible hypothyroidism may contribute to constipation.  States he was put on levothyroxine  in the past by psychiatry (?) and that it was discontinued but he is unsure why.  Denies any knowledge of thyroid  dysfunction.  Has previously tried  stool softeners without much improvement in bowel habits, and tried MiraLAX  but did not like the form of medication, did not tolerate drinking dissolved powder.  - Provided Linzess 145 mcg samples  - Check TSH today - Plan for further evaluation and management at follow-up after EGD and once we have reviewed past colonoscopy records  Fatty liver  Patient reports longstanding history of fatty liver.  Last ultrasound 2023 consistent with fatty filtration of the liver, no liver mass noted.  Has had normal liver enzymes.  States this is followed by his PCP.    (Fibrosis 4 Score = 1.99 Fib-4 interpretation is not validated for people under 35 or over 18 years of age. However, scores under 2.0 are generally considered low risk.)    Camie Furbish, PA-C [facility] 03/16/2024, 3:29 PM  Patient Care Team: Levora Reyes SAUNDERS, MD as PCP - General (Family Medicine) Comer, Lamar ORN, MD (Inactive) as PCP - Infectious Diseases (Infectious Diseases) Raford Riggs, MD as PCP - Cardiology (Cardiology) Celestia Agent, MD (Inactive) as PCP - Gastroenterology (Gastroenterology) Burt Fus, DPM as Consulting Physician (Podiatry) Kassie Mallick, MD (Inactive) as Consulting Physician (Endocrinology) Jeffie Haws, OD (Optometry) Court Dorn PARAS, MD as Consulting Physician (Cardiology) Vincente Grip, MD as Consulting Physician (Psychiatry) Prescilla Beams, MD as Consulting Physician (Nephrology) Beverley Evalene BIRCH, MD as Attending Physician (Orthopedic Surgery) Onita Duos, MD as Consulting Physician (Neurology) Dohmeier, Dedra, MD as Consulting Physician (Neurology) Roz Anes, MD as Consulting Physician (Ophthalmology) Nicholaus, Sherlean CROME, [facility] (Inactive) (Pharmacist)       [1]  Social History Tobacco Use   Smoking status: Former    Current packs/day: 0.00    Average packs/day: 0.1 packs/day for 10.0 years (1.0 ttl pk-yrs)    Types: Cigars, Cigarettes    Start date:  08/08/2004    Quit date: 08/09/2014    Years since quitting: 9.6   Smokeless tobacco: Never  Vaping Use   Vaping status: Never Used  Substance Use Topics   Alcohol use: No    Alcohol/week: 0.0 standard drinks of alcohol   Drug use: No   "

## 2024-03-16 NOTE — Patient Instructions (Signed)
 You have been scheduled for an endoscopy. Please follow written instructions given to you at your visit today.  If you use inhalers (even only as needed), please bring them with you on the day of your procedure.  If you take any of the following medications, they will need to be adjusted prior to your procedure:   DO NOT TAKE 7 DAYS PRIOR TO TEST- Trulicity  (dulaglutide ) Ozempic , Wegovy  (semaglutide ) Mounjaro, Zepbound (tirzepatide) Bydureon Bcise (exanatide extended release)  DO NOT TAKE 1 DAY PRIOR TO YOUR TEST Rybelsus  (semaglutide ) Adlyxin (lixisenatide) Victoza (liraglutide) Byetta (exanatide) ___________________________________________________________________________  Please go to the lab in the basement of our building to have lab work done as you leave today. Hit B for basement when you get on the elevator.  When the doors open the lab is on your left.  We will call you with the results. Thank you.  _______________________________________________________  If your blood pressure at your visit was 140/90 or greater, please contact your primary care physician to follow up on this.  _______________________________________________________  If you are age 71 or older, your body mass index should be between 23-30. Your Body mass index is 36.88 kg/m. If this is out of the aforementioned range listed, please consider follow up with your Primary Care Provider.  If you are age 71 or younger, your body mass index should be between 19-25. Your Body mass index is 36.88 kg/m. If this is out of the aformentioned range listed, please consider follow up with your Primary Care Provider.   ________________________________________________________  [facility] providers would like to encourage you to use MYCHART to communicate with providers for non-urgent requests or questions.  Due to long hold times on the telephone, sending your provider a message by [facility] may be a faster and more  efficient way to get a response.  Please allow 48 business hours for a response.  Please remember that this is for non-urgent requests.  _______________________________________________________  [facility] is using a team-based approach to care.  Your team is made up of your doctor and two to three APPS. Our APPS (Nurse Practitioners and Physician Assistants) work with your physician to ensure care continuity for you. They are fully qualified to address your health concerns and develop a treatment plan. They communicate directly with your gastroenterologist to care for you. Seeing the Advanced Practice Practitioners on your physician's team can help you by facilitating care more promptly, often allowing for earlier appointments, access to diagnostic testing, procedures, and other specialty referrals.   Due to recent changes in healthcare laws, you may see the results of your imaging and laboratory studies on MyChart before your provider has had a chance to review them.  We understand that in some cases there may be results that are confusing or concerning to you. Not all laboratory results come back in the same time frame and the provider may be waiting for multiple results in order to interpret others.  Please give us  48 hours in order for your provider to thoroughly review all the results before contacting the office for clarification of your results.

## 2024-03-16 NOTE — Telephone Encounter (Signed)
 [facility] Pre-operative Risk Assessment     Request for surgical clearance:     Endoscopy Procedure  What type of surgery is being performed?     Endoscopy  When is this surgery scheduled?     04/07/2024  What type of clearance is required ?   Pharmacy  Are there any medications that need to be held prior to surgery and how long? Xarelto   Practice name and name of physician performing surgery?      [facility]  What is your office phone and fax number?      Phone- [phone]  Fax- [phone]  Anesthesia type (None, local, MAC, general) ?       MAC   Please route your response to Ezio Wieck, CMA

## 2024-03-17 ENCOUNTER — Ambulatory Visit: Payer: Self-pay | Admitting: Gastroenterology

## 2024-03-18 NOTE — Progress Notes (Signed)
 ____________________________________________________________  Attending physician addendum:  Thank you for sending this case to me. I have reviewed the entire note and agree with the plan.  If the patient has this information, it would be helpful at some point to understand why he had a CT colonography in the past (primary CRC screening?  Incomplete colonoscopy?)  Victory Brand, MD  ____________________________________________________________

## 2024-03-21 NOTE — Telephone Encounter (Signed)
 Patient on Xarelto  for history of DVT, followed by Dr. Levora.  Would recommend reaching out to him for clearance.

## 2024-03-22 NOTE — Telephone Encounter (Signed)
 History of anticoagulation with Xarelto  for prior DVT.  GI note reviewed from January 21.  EGD planned with possible dilation.  Given that plan, 2 days of interruption (holding Xarelto  2 days prior to that procedure) would be reasonable.

## 2024-03-22 NOTE — Telephone Encounter (Signed)
 Dear Dr. Levora,  We need pre op clearance for the patient's upcoming procedure.  Thank you!   Request for surgical clearance:     Endoscopy Procedure  What type of surgery is being performed?     Endoscopy  When is this surgery scheduled?     04/07/2024  What type of clearance is required ?   Pharmacy  Are there any medications that need to be held prior to surgery and how long? Xarelto   Practice name and name of physician performing surgery?      [facility]  What is your office phone and fax number?      Phone- [phone]  Fax- [phone]  Anesthesia type (None, local, MAC, general) ?       MAC     Please route your response to Adilen Pavelko, CMA

## 2024-03-22 NOTE — Telephone Encounter (Signed)
" ° °  Xarelto  is managed by primary care. Management will need to be made by Levora Reyes SAUNDERS, MD. Glendia Ferrier, PA-C    03/22/2024 11:30 AM   "

## 2024-03-29 ENCOUNTER — Other Ambulatory Visit: Payer: Self-pay | Admitting: Family Medicine

## 2024-03-29 DIAGNOSIS — E1142 Type 2 diabetes mellitus with diabetic polyneuropathy: Secondary | ICD-10-CM

## 2024-03-29 DIAGNOSIS — M792 Neuralgia and neuritis, unspecified: Secondary | ICD-10-CM

## 2024-03-29 NOTE — Telephone Encounter (Signed)
 Controlled substance database reviewed.  Pregabalin  No. 60 last filled on 03/04/2024.  Previously 12/31/23.  Office visit 01/08/2024.  Refill ordered.

## 2024-03-29 NOTE — Telephone Encounter (Signed)
 Medication: Lyrica  Directions: TAKE 1 CAPSULE(300 MG) BY MOUTH TWICE DAILY  Last given: 02/04/2024 Number refills: 0 Last o/v:  Follow up:  Labs:

## 2024-03-31 ENCOUNTER — Encounter: Payer: Self-pay | Admitting: Gastroenterology

## 2024-04-07 ENCOUNTER — Encounter: Admitting: Gastroenterology

## 2024-04-26 ENCOUNTER — Ambulatory Visit: Admitting: Podiatry

## 2024-05-17 ENCOUNTER — Telehealth: Admitting: Adult Health

## 2024-05-19 ENCOUNTER — Ambulatory Visit: Admitting: Internal Medicine

## 2024-06-09 ENCOUNTER — Ambulatory Visit: Admitting: Family

## 2024-07-07 ENCOUNTER — Encounter: Admitting: Family Medicine

## 2024-07-14 ENCOUNTER — Ambulatory Visit
